# Patient Record
Sex: Female | Born: 1940 | Race: Black or African American | Hispanic: No | Smoking: Never smoker
Health system: Southern US, Community
[De-identification: ages and names within clinical notes are randomized; demographics above are authoritative.]

## PROBLEM LIST (undated history)

## (undated) DIAGNOSIS — E079 Disorder of thyroid, unspecified: Secondary | ICD-10-CM

## (undated) DIAGNOSIS — K219 Gastro-esophageal reflux disease without esophagitis: Secondary | ICD-10-CM

## (undated) DIAGNOSIS — K5790 Diverticulosis of intestine, part unspecified, without perforation or abscess without bleeding: Secondary | ICD-10-CM

## (undated) DIAGNOSIS — K449 Diaphragmatic hernia without obstruction or gangrene: Secondary | ICD-10-CM

## (undated) DIAGNOSIS — M8580 Other specified disorders of bone density and structure, unspecified site: Secondary | ICD-10-CM

## (undated) DIAGNOSIS — E785 Hyperlipidemia, unspecified: Secondary | ICD-10-CM

## (undated) DIAGNOSIS — M109 Gout, unspecified: Secondary | ICD-10-CM

## (undated) DIAGNOSIS — K862 Cyst of pancreas: Secondary | ICD-10-CM

## (undated) DIAGNOSIS — D649 Anemia, unspecified: Secondary | ICD-10-CM

## (undated) DIAGNOSIS — E860 Dehydration: Secondary | ICD-10-CM

## (undated) DIAGNOSIS — N189 Chronic kidney disease, unspecified: Secondary | ICD-10-CM

## (undated) DIAGNOSIS — M1990 Unspecified osteoarthritis, unspecified site: Secondary | ICD-10-CM

## (undated) DIAGNOSIS — Z9289 Personal history of other medical treatment: Secondary | ICD-10-CM

## (undated) DIAGNOSIS — I10 Essential (primary) hypertension: Secondary | ICD-10-CM

## (undated) HISTORY — DX: Gastro-esophageal reflux disease without esophagitis: K21.9

## (undated) HISTORY — PX: OTHER SURGICAL HISTORY: SHX169

## (undated) HISTORY — DX: Other specified disorders of bone density and structure, unspecified site: M85.80

## (undated) HISTORY — DX: Gout, unspecified: M10.9

## (undated) HISTORY — DX: Disorder of thyroid, unspecified: E07.9

## (undated) HISTORY — DX: Diverticulosis of intestine, part unspecified, without perforation or abscess without bleeding: K57.90

## (undated) HISTORY — DX: Cyst of pancreas: K86.2

## (undated) HISTORY — DX: Diaphragmatic hernia without obstruction or gangrene: K44.9

## (undated) HISTORY — DX: Dehydration: E86.0

## (undated) HISTORY — DX: Hyperlipidemia, unspecified: E78.5

## (undated) HISTORY — PX: EYE SURGERY: SHX253

## (undated) HISTORY — PX: CHOLECYSTECTOMY: SHX55

---

## 1997-11-20 DIAGNOSIS — K862 Cyst of pancreas: Secondary | ICD-10-CM

## 1997-11-20 HISTORY — DX: Cyst of pancreas: K86.2

## 1997-11-20 HISTORY — PX: PANCREATIC CYST EXCISION: SHX2157

## 2003-12-23 ENCOUNTER — Other Ambulatory Visit: Admission: RE | Admit: 2003-12-23 | Discharge: 2003-12-23 | Payer: Self-pay | Admitting: Internal Medicine

## 2004-01-11 ENCOUNTER — Ambulatory Visit (HOSPITAL_COMMUNITY): Admission: RE | Admit: 2004-01-11 | Discharge: 2004-01-11 | Payer: Self-pay | Admitting: Internal Medicine

## 2004-12-30 ENCOUNTER — Emergency Department (HOSPITAL_COMMUNITY): Admission: EM | Admit: 2004-12-30 | Discharge: 2004-12-30 | Payer: Self-pay | Admitting: Family Medicine

## 2005-01-23 ENCOUNTER — Ambulatory Visit: Payer: Self-pay

## 2005-01-24 ENCOUNTER — Ambulatory Visit (HOSPITAL_COMMUNITY): Admission: RE | Admit: 2005-01-24 | Discharge: 2005-01-24 | Payer: Self-pay | Admitting: Internal Medicine

## 2006-01-26 ENCOUNTER — Ambulatory Visit (HOSPITAL_COMMUNITY): Admission: RE | Admit: 2006-01-26 | Discharge: 2006-01-26 | Payer: Self-pay | Admitting: Internal Medicine

## 2006-04-20 ENCOUNTER — Other Ambulatory Visit: Admission: RE | Admit: 2006-04-20 | Discharge: 2006-04-20 | Payer: Self-pay | Admitting: Internal Medicine

## 2006-08-24 ENCOUNTER — Encounter: Admission: RE | Admit: 2006-08-24 | Discharge: 2006-08-24 | Payer: Self-pay | Admitting: Gastroenterology

## 2007-01-28 ENCOUNTER — Ambulatory Visit (HOSPITAL_COMMUNITY): Admission: RE | Admit: 2007-01-28 | Discharge: 2007-01-28 | Payer: Self-pay | Admitting: Internal Medicine

## 2008-01-29 ENCOUNTER — Ambulatory Visit (HOSPITAL_COMMUNITY): Admission: RE | Admit: 2008-01-29 | Discharge: 2008-01-29 | Payer: Self-pay | Admitting: Internal Medicine

## 2009-02-09 ENCOUNTER — Ambulatory Visit (HOSPITAL_COMMUNITY): Admission: RE | Admit: 2009-02-09 | Discharge: 2009-02-09 | Payer: Self-pay | Admitting: Internal Medicine

## 2010-02-11 ENCOUNTER — Ambulatory Visit (HOSPITAL_COMMUNITY): Admission: RE | Admit: 2010-02-11 | Discharge: 2010-02-11 | Payer: Self-pay | Admitting: Internal Medicine

## 2010-02-21 ENCOUNTER — Ambulatory Visit (HOSPITAL_COMMUNITY): Admission: RE | Admit: 2010-02-21 | Discharge: 2010-02-21 | Payer: Self-pay | Admitting: Internal Medicine

## 2010-02-21 ENCOUNTER — Ambulatory Visit: Payer: Self-pay | Admitting: Vascular Surgery

## 2010-06-30 ENCOUNTER — Other Ambulatory Visit: Admission: RE | Admit: 2010-06-30 | Discharge: 2010-06-30 | Payer: Self-pay | Admitting: Internal Medicine

## 2010-11-20 HISTORY — PX: JOINT REPLACEMENT: SHX530

## 2010-12-11 ENCOUNTER — Encounter: Payer: Self-pay | Admitting: Internal Medicine

## 2011-01-06 ENCOUNTER — Other Ambulatory Visit (HOSPITAL_COMMUNITY): Payer: Self-pay | Admitting: Internal Medicine

## 2011-01-06 DIAGNOSIS — Z1231 Encounter for screening mammogram for malignant neoplasm of breast: Secondary | ICD-10-CM

## 2011-01-09 ENCOUNTER — Other Ambulatory Visit: Payer: Self-pay | Admitting: Orthopedic Surgery

## 2011-01-09 ENCOUNTER — Encounter (HOSPITAL_COMMUNITY)
Admission: RE | Admit: 2011-01-09 | Discharge: 2011-01-09 | Disposition: A | Discharge status: Home / Self Care | Payer: Medicare Other | Source: Ambulatory Visit | Attending: Orthopedic Surgery | Admitting: Orthopedic Surgery

## 2011-01-09 ENCOUNTER — Other Ambulatory Visit (HOSPITAL_COMMUNITY): Payer: Self-pay | Admitting: Orthopedic Surgery

## 2011-01-09 ENCOUNTER — Encounter (HOSPITAL_COMMUNITY): Payer: Medicare Other

## 2011-01-09 DIAGNOSIS — M1710 Unilateral primary osteoarthritis, unspecified knee: Secondary | ICD-10-CM

## 2011-01-09 DIAGNOSIS — Z01812 Encounter for preprocedural laboratory examination: Secondary | ICD-10-CM | POA: Insufficient documentation

## 2011-01-09 DIAGNOSIS — IMO0002 Reserved for concepts with insufficient information to code with codable children: Secondary | ICD-10-CM | POA: Insufficient documentation

## 2011-01-09 DIAGNOSIS — Z01811 Encounter for preprocedural respiratory examination: Secondary | ICD-10-CM | POA: Insufficient documentation

## 2011-01-09 LAB — URINALYSIS, ROUTINE W REFLEX MICROSCOPIC
Ketones, ur: NEGATIVE mg/dL
Specific Gravity, Urine: 1.011 (ref 1.005–1.030)
Urine Glucose, Fasting: NEGATIVE mg/dL
pH: 5 (ref 5.0–8.0)

## 2011-01-09 LAB — COMPREHENSIVE METABOLIC PANEL
AST: 20 U/L (ref 0–37)
Albumin: 3.7 g/dL (ref 3.5–5.2)
BUN: 28 mg/dL — ABNORMAL HIGH (ref 6–23)
Calcium: 10.6 mg/dL — ABNORMAL HIGH (ref 8.4–10.5)
Creatinine, Ser: 1.96 mg/dL — ABNORMAL HIGH (ref 0.4–1.2)
GFR calc Af Amer: 30 mL/min — ABNORMAL LOW (ref >60)
Total Protein: 7.3 g/dL (ref 6.0–8.3)

## 2011-01-09 LAB — URINE MICROSCOPIC-ADD ON

## 2011-01-09 LAB — CBC
HCT: 35.8 % — ABNORMAL LOW (ref 36.0–46.0)
MCH: 28.5 pg (ref 26.0–34.0)
MCV: 90.4 fL (ref 78.0–100.0)
Platelets: 205 10*3/uL (ref 150–400)
RBC: 3.96 MIL/uL (ref 3.87–5.11)

## 2011-01-18 ENCOUNTER — Inpatient Hospital Stay (HOSPITAL_COMMUNITY)
Admission: RE | Admit: 2011-01-18 | Discharge: 2011-01-21 | DRG: 470 | Disposition: A | Discharge status: Skilled Nursing Facility | Payer: Medicare Other | Source: Ambulatory Visit | Attending: Orthopedic Surgery | Admitting: Orthopedic Surgery

## 2011-01-18 DIAGNOSIS — E119 Type 2 diabetes mellitus without complications: Secondary | ICD-10-CM | POA: Diagnosis present

## 2011-01-18 DIAGNOSIS — M899 Disorder of bone, unspecified: Secondary | ICD-10-CM | POA: Diagnosis present

## 2011-01-18 DIAGNOSIS — N189 Chronic kidney disease, unspecified: Secondary | ICD-10-CM | POA: Diagnosis present

## 2011-01-18 DIAGNOSIS — M949 Disorder of cartilage, unspecified: Secondary | ICD-10-CM | POA: Diagnosis present

## 2011-01-18 DIAGNOSIS — E7800 Pure hypercholesterolemia, unspecified: Secondary | ICD-10-CM | POA: Diagnosis present

## 2011-01-18 DIAGNOSIS — D62 Acute posthemorrhagic anemia: Secondary | ICD-10-CM | POA: Diagnosis not present

## 2011-01-18 DIAGNOSIS — M1710 Unilateral primary osteoarthritis, unspecified knee: Principal | ICD-10-CM | POA: Diagnosis present

## 2011-01-18 DIAGNOSIS — I129 Hypertensive chronic kidney disease with stage 1 through stage 4 chronic kidney disease, or unspecified chronic kidney disease: Secondary | ICD-10-CM | POA: Diagnosis present

## 2011-01-18 LAB — GLUCOSE, CAPILLARY: Glucose-Capillary: 84 mg/dL (ref 70–99)

## 2011-01-18 LAB — ABO/RH: ABO/RH(D): O POS

## 2011-01-19 LAB — CBC
HCT: 26 % — ABNORMAL LOW (ref 36.0–46.0)
Hemoglobin: 8.1 g/dL — ABNORMAL LOW (ref 12.0–15.0)
MCV: 89.3 fL (ref 78.0–100.0)
RDW: 13.4 % (ref 11.5–15.5)
WBC: 7.5 10*3/uL (ref 4.0–10.5)

## 2011-01-19 LAB — BASIC METABOLIC PANEL
BUN: 26 mg/dL — ABNORMAL HIGH (ref 6–23)
Calcium: 9.7 mg/dL (ref 8.4–10.5)
Creatinine, Ser: 1.74 mg/dL — ABNORMAL HIGH (ref 0.4–1.2)
GFR calc non Af Amer: 29 mL/min — ABNORMAL LOW (ref >60)

## 2011-01-19 LAB — PROTIME-INR: INR: 1.22 (ref 0.00–1.49)

## 2011-01-19 LAB — GLUCOSE, CAPILLARY: Glucose-Capillary: 158 mg/dL — ABNORMAL HIGH (ref 70–99)

## 2011-01-20 LAB — TYPE AND SCREEN
ABO/RH(D): O POS
Antibody Screen: NEGATIVE
Unit division: 0
Unit division: 0

## 2011-01-20 LAB — GLUCOSE, CAPILLARY
Glucose-Capillary: 143 mg/dL — ABNORMAL HIGH (ref 70–99)
Glucose-Capillary: 133 mg/dL — ABNORMAL HIGH (ref 70–99)
Glucose-Capillary: 124 mg/dL — ABNORMAL HIGH (ref 70–99)

## 2011-01-20 LAB — BASIC METABOLIC PANEL
GFR calc non Af Amer: 30 mL/min — ABNORMAL LOW (ref >60)
Glucose, Bld: 174 mg/dL — ABNORMAL HIGH (ref 70–99)
Potassium: 4.7 mEq/L (ref 3.5–5.1)
Sodium: 137 mEq/L (ref 135–145)

## 2011-01-20 LAB — CBC
HCT: 32.1 % — ABNORMAL LOW (ref 36.0–46.0)
Hemoglobin: 10.4 g/dL — ABNORMAL LOW (ref 12.0–15.0)
MCH: 28.3 pg (ref 26.0–34.0)
RBC: 3.68 MIL/uL — ABNORMAL LOW (ref 3.87–5.11)

## 2011-01-20 LAB — PROTIME-INR: Prothrombin Time: 18.9 seconds — ABNORMAL HIGH (ref 11.6–15.2)

## 2011-01-21 LAB — CBC
MCH: 28.5 pg (ref 26.0–34.0)
MCV: 87.6 fL (ref 78.0–100.0)
Platelets: 153 10*3/uL (ref 150–400)
RDW: 13.9 % (ref 11.5–15.5)
WBC: 9.6 10*3/uL (ref 4.0–10.5)

## 2011-01-26 NOTE — Discharge Summary (Signed)
Eileen Perez, Eileen Perez               ACCOUNT NO.:  [id number]  MEDICAL RECORD NO.:  [id number]           PATIENT TYPE:  I  LOCATION:  [id number]                         FACILITY:  [facility]  PHYSICIAN:  Gaynelle Arabian, M.D.    DATE OF BIRTH:  [birth year]  DATE OF ADMISSION:  01/18/2011 DATE OF DISCHARGE:                        DISCHARGE SUMMARY - REFERRING   TENTATIVE DATE OF DISCHARGE:  January 21, 2011.  ADMITTING DIAGNOSES: 1. Osteoarthritis left knee. 2. Hypertension. 3. Hypercholesterolemia. 4. Non-insulin-dependent diabetes mellitus. 5. Chronic kidney disease. 6. Osteopenia.  DISCHARGE DIAGNOSES: 1. Osteoarthritis left knee, status post left total knee replacement     arthroplasty. 2. Postop acute blood loss anemia. 3. Status post transfusion without sequelae. 4. Hypertension. 5. Hypercholesterolemia. 6. Non-insulin-dependent diabetes mellitus. 7. Chronic kidney disease. 8. Osteopenia.  PROCEDURE:  January 18, 2011, left total knee.  SURGEON:  Gaynelle Arabian, M.D.  ASSISTANT:  Alexzandrew L. Perkins, P.A.C.  ANESTHESIA:  Spinal anesthesia.  TOURNIQUET TIME:  34 minutes.  CONSULTS:  None.  BRIEF HISTORY:  The patient is a 70 year old female who has been seen by Dr. Wynelle Link for end-stage arthritis of left knee with severe varus deformity, failed nonoperative management and now presents for total knee arthroplasty.  LABORATORY DATA:  Preop CBC showed a hemoglobin 11.3, hematocrit 35.8, white cell count 6.0, and platelets 205,000.  PT/INR 13.9 and 1.05 with PTT of 35.  Chem panel on admission, elevated BUN and creatinine of 28 and 1.96 respectively.  She has known chronic kidney disease.  The alk phos was also elevated at 142.  Calcium was high at 10.6.  Remaining chem panel within normal limits.  Preop UA, positive for protein and trace leukocytes, 0-2 white cells, rare bacteria.  Blood group type O+. Nasal swabs were negative for staph aureus, negative for MRSA.   Serial CBCs were followed.  Hemoglobin dropped down to 8.1 on postop day #1, given 2 units of blood, post receiving hemoglobin back up to 10.4 with hematocrit of 32.1.  Last CBC pending at time of dictation.  Serial pro times followed per Coumadin protocol.  PT/INR postop day #2 is 18.9 and 1.56.  She will have another pro time on the day of discharge.  Serial BMETs were followed.  Potassium went up from 5.0-5.6 down to 4.7.  The BUN actually improved down to 23, the creatinine improved down to 1.68. These were both noted on postop day #2.  EKG dated June 30, 2010, sinus rhythm, nonspecific changes.  HOSPITAL COURSE:  The patient was admitted to [facility], taken to OR, underwent above-stated procedure without complication.  The patient tolerated the procedure well, later transferred to the recovery room orthopedic floor, started on p.o. and IV analgesic pain control following surgery.  Pain was under fairly good control on postop day #1, started getting up out of bed.  Hemoglobin was down to 8.1 though and urine output was marginal.  It was felt that she would benefit undergoing transfusion.  She was given 2 units of packed cells and tolerated the blood well.  She also wanted to look into skilled facility, so got the social work involved.  She was placed on Coumadin for DVT prophylaxis.  She got up out of bed a little bit on day #1.  By day #2, she was starting to move around a little bit better.  Dressing changed, incision looked good.  She was feeling much better after the transfusion.  Hemoglobin was back up to 10.4.  She was in good spirits. It is possible that a bed will be available possibly at [facility] on Saturday.  Arrangements were being made if a bed became available.  She will probably go over the weekend.  She was seen on day #2, and we are making arrangements for the weekend discharge.  DISCHARGE PLAN: 1. Tentative transfer to a skilled facility on January 21, 2011 on     Saturday. 2. Discharge diagnoses, please see above.  DISCHARGE MEDICATIONS:  Current medications at time of transfer include the following list: 1. Coumadin protocol.  Please titrate the Coumadin level for target     INR between 2.0 and 3.0.  She needs to be on Coumadin for 3 weeks     from date of surgery of January 18, 2011. 2. Colace 100 mg p.o. b.i.d. 3. Norvasc 5 mg p.o. at bedtime. 4. Aspirin 325 mg half tablet p.o. daily. 5. Welchol 3.75 g 1 packet every evening. 6. Robaxin 500 mg p.o. q.6-8h. p.r.n. spasm. 7. Percocet 5 mg 1 or 2 every 4-6 hours as needed for pain. 8. Tylenol 325 one or two every 4-6 hours need for mild pain,     temperature, or headache. 9. Restoril 15 mg 1-2 p.o. at bedtime p.r.n. sleep.  DIET:  Heart-healthy renal diet.  ACTIVITY:  She is weightbearing as tolerated, total knee protocol.  PT and OT for gait training, ambulation, ADLs, range of motion, and strengthening exercises.  FOLLOWUP:  She needs to follow up in 2 weeks from date of surgery. Please contact the office at [phone] to make an arrangement for followup care with Dr. Wynelle Link on either January 31, 2011, or February 02, 2011.  Please contact the office of [phone] to help arrange appointment and followup care.  The patient may shower once she is transferred to the skilled nurse facility; however, do not submerge incision under water.  DISPOSITION:  Pending at time of dictation, waiting on skilled nursing facility bed.  CONDITION UPON DISCHARGE:  Improving at time of dictation.  Re-evaluate once bed is available.     Alexzandrew L. Dara Lords, P.A.C.   ______________________________ Gaynelle Arabian, M.D.    ALP/MEDQ  D:  01/20/2011  T:  01/20/2011  Job:  [id number]  cc:   Darcus Austin, D.O. Fax: [phone]  Gaynelle Arabian, M.D. Fax: [phone]  Electronically Signed by Mickel Crow P.A.C. on 01/23/2011 10:48:18 AM Electronically Signed by Gaynelle Arabian M.D. on  01/25/2011 03:47:33 PM

## 2011-01-26 NOTE — H&P (Signed)
Eileen Perez, Eileen Perez               ACCOUNT NO.:  [id number]  MEDICAL RECORD NO.:  [id number]         PATIENT TYPE:  LINP  LOCATION:  [id number]                         FACILITY:  [facility]  PHYSICIAN:  Gaynelle Arabian, M.D.    DATE OF BIRTH:  Jul 26, 1941  DATE OF ADMISSION:  01/18/2011 DATE OF DISCHARGE:                             HISTORY & PHYSICAL   CHIEF COMPLAINT:  Left knee pain.  HISTORY OF PRESENT ILLNESS:  The patient is a 70 year old female who has been seen by Dr. Wynelle Link for ongoing bilateral knee pain, the left is more symptomatic and problematic than the right.  It has been ongoing for several years now, it had progressively gotten worse.  X-rays in the office show advanced arthritis in both knees and the left is worse on radiograph than the right.  She has had injections in the past and at this point, felt to benefit in undergoing surgical intervention.  Risks and benefits have been discussed.  She elected to proceed with surgery.  ALLERGIES:  No known drug allergies.  CURRENT MEDICATIONS:  Quinapril, amlodipine, meloxicam, WelChol, multivitamin, calcium, vitamin D, fish oil, flaxseed oil, zinc, baby aspirin.  PAST MEDICAL HISTORY: 1. Hypertension. 2. Hypercholesterolemia. 3. Non-insulin-dependent diabetes mellitus. 4. Chronic kidney disease. 5. Osteopenia.  PAST SURGICAL HISTORY:  Gallbladder surgery in 1999, pancreatic cyst surgery in 1999 and left cataracts with lens implant.  FAMILY HISTORY:  Father with cancer.  Mother with cancer.  SOCIAL HISTORY:  Divorced, retired, nonsmoker.  No alcohol.  Lives alone.  She does want to look into a rehab facility, possibly [facility] or [facility].  REVIEW OF SYSTEMS:  GENERAL:  No fevers, chills, night sweats.  NEURO: No seizures, syncope or paralysis.  RESPIRATORY:  No shortness breath, productive cough or hemoptysis.  CARDIOVASCULAR:  No chest pain or orthopnea.  GI:  No nausea, vomiting, diarrhea or  constipation.  GU:  No dysuria, hematuria, or discharge.  MUSCULOSKELETAL:  Left knee.  PHYSICAL EXAMINATION:  VITAL SIGNS:  Pulse 64, respirations 12, blood pressure 138/84. GENERAL:  A 70 year old [place] female well-nourished, well- developed, in no acute distress.  She is alert, oriented and cooperative, pleasant.  Good historian. HEENT:  Normocephalic, atraumatic.  Pupils are round and reactive.  EOMs intact. NECK:  Supple. CHEST:  Clear. HEART:  Regular rate and rhythm without murmur, S1 or S2 noted. ABDOMEN:  Soft, slightly round.  Bowel sounds present. BREASTS/GENITALIA:  Not done, not pertinent to present illness. EXTREMITIES:  Left knee has significant deformity.  Range of motion 5 to 115, marked crepitus tender, more medial than lateral.  IMPRESSION:  Osteoarthritis, left knee.  PLAN:  The patient admitted to [facility] to undergo a left total knee replacement arthroplasty.  Surgery will be performed by Dr. Gaynelle Arabian.  She does want to look into skilled facility, possibly [facility] or [facility].     Alexzandrew L. Dara Lords, P.A.C.   ______________________________ Gaynelle Arabian, M.D.    ALP/MEDQ  D:  01/17/2011  T:  01/17/2011  Job:  [id number]  cc:   Darcus Austin, D.O. Fax: [phone]  Electronically Signed by Mickel Crow  P.A.C. on 01/23/2011 10:48:09 AM Electronically Signed by Gaynelle Arabian M.D. on 01/25/2011 03:47:30 PM

## 2011-01-26 NOTE — Op Note (Signed)
[name], [name]               ACCOUNT NO.:  [id number]  MEDICAL RECORD NO.:  [id number]           PATIENT TYPE:  I  LOCATION:  [id number]                         FACILITY:  [facility]  PHYSICIAN:  Gaynelle Arabian, M.D.    DATE OF BIRTH:  06-21-1941  DATE OF PROCEDURE:  01/18/2011 DATE OF DISCHARGE:                              OPERATIVE REPORT   PREOPERATIVE DIAGNOSIS:  Osteoarthritis, left knee.  POSTOPERATIVE DIAGNOSIS:  Osteoarthritis, left knee.  PROCEDURE:  Left total knee arthroplasty.  SURGEON:  Gaynelle Arabian, M.D.  ASSISTANT:  Alexzandrew L. Perkins, P.A.C.  ANESTHESIA:  Spinal.  ESTIMATED BLOOD LOSS:  Minimal.  DRAINS:  Hemovac x1.  TOURNIQUET TIME:  34 minutes at 300 mmHg.  COMPLICATIONS:  None.  CONDITION:  Stable to recovery.  BRIEF CLINICAL NOTE:  Eileen Perez is a 70 year old female with severe end-stage arthritis of the left knee with severe varus deformity.  She has failed nonoperative management and presents for total knee arthroplasty.  PROCEDURE IN DETAIL:  After successful administration of spinal anesthetic, a tourniquet placed high on the left thigh and left lower extremity prepped and draped in the usual sterile fashion.  Extremity was wrapped in Esmarch, knee flexed, tourniquet inflated to 300 mmHg. Midline incision was made with a #10 blade through subcutaneous tissue to the level of the extensor mechanism.  A fresh blade was used to make a medial parapatellar arthrotomy.  Soft tissue on the proximal medial tibia was subperiosteally elevated to the joint line with a knife and into the semimembranosus bursa with a Cobb elevator.  Soft tissue laterally was elevated with attention being paid to avoiding the patellar tendon on the tibial tubercle.  Patella was everted, knee flexed 90 degrees, and ACL and PCL removed.  Drill was used create a starting hole in the distal femur and the canal was thoroughly irrigated.  The 5-degree left valgus alignment  guide was placed and a distal femoral cutting block pinned to remove 10 mm off the distal femur.  Distal femoral resection was made with an oscillating saw.  The tibia subluxed forward and menisci removed.  She had a huge defect on the medial femoral condyle.  The extramedullary cutting guide was then placed referencing proximally at the medial aspect of the tibial tubercle and distally along the second metatarsal axis and tibial crest. Blocks pinned to remove 2 mm off the more deficient medial side.  This was a thicker cut than usual to get to the base of this defect. Resection was made with an oscillating saw.  Size 2.5 was most appropriate tibial component.  The proximal tibia was prepared to modular drill and keel punch for the size 2.5.  The femur was then prepared.  The femoral sizing guide was placed and a size 3.  Rotation was marked at the epicondylar axis and confirmed by creating a rectangular flexion gap at 90 degrees.  The block was pinned in this position with a size 3 and anterior-posterior chamfer cuts made. Intercondylar block placed and that cut made.  Trial size 3 posterior stabilized femur was placed.  A 15-mm posterior stabilized rotating platform insert  trial was placed.  With the 15, full extension achieved with excellent varus-valgus and anterior-posterior stability throughout full range of motion.  The patella was then everted and thickness measured to 23 mm.  Freehand resection with 13 mm, 35 template was placed, lug holes were drilled, trial patella was placed and it tracked normally.  Osteophytes were removed off the posterior femur with the trial in place.  All trials were removed and the cut bone surfaces were prepared with pulsatile lavage.  Cement was mixed and once ready for implantation, the size 2.5 mobile bearing tibial tray size 3 posterior stabilized femur and 35 patella were all cemented into place and the patella was held with a clamp.  Trial 15  mm inserts were placed, knee held in full extension and all extruded cement removed.  When the cement had fully hardened, then the permanent 15 mm posterior stabilized rotating platform insert was placed and a tibial tray.  The wound was copiously irrigated with saline solution and the arthrotomy closed over Hemovac drain with interrupted #1 PDS.  Flexion against gravity was about 140 degrees and patella tracked normally.  Tourniquet was released after total time of 34 minutes.  Subcu was closed interrupted 2-0 Vicryl, subcuticular with running 4-0 Monocryl.  Catheter for the Marcaine pain pump was placed and the pump initiated.  The incision was then cleaned and dried and Steri-Strips and a bulky sterile dressing were applied.  She was then placed into a knee immobilizer, awakened, transported to recovery in stable condition.     Gaynelle Arabian, M.D.     FA/MEDQ  D:  01/18/2011  T:  01/18/2011  Job:  [id number]  Electronically Signed by Gaynelle Arabian M.D. on 01/25/2011 03:47:23 PM

## 2011-02-27 ENCOUNTER — Encounter (HOSPITAL_COMMUNITY): Payer: Medicare Other | Attending: Nephrology

## 2011-02-27 DIAGNOSIS — D509 Iron deficiency anemia, unspecified: Secondary | ICD-10-CM | POA: Insufficient documentation

## 2011-03-06 ENCOUNTER — Encounter (HOSPITAL_COMMUNITY): Payer: Medicare Other

## 2011-03-14 ENCOUNTER — Ambulatory Visit (HOSPITAL_COMMUNITY)
Admission: RE | Admit: 2011-03-14 | Discharge: 2011-03-14 | Disposition: A | Discharge status: Home / Self Care | Payer: Medicare Other | Source: Ambulatory Visit | Attending: Internal Medicine | Admitting: Internal Medicine

## 2011-03-14 DIAGNOSIS — Z1231 Encounter for screening mammogram for malignant neoplasm of breast: Secondary | ICD-10-CM | POA: Insufficient documentation

## 2012-01-23 ENCOUNTER — Other Ambulatory Visit: Payer: Self-pay | Admitting: Internal Medicine

## 2012-01-23 DIAGNOSIS — I10 Essential (primary) hypertension: Secondary | ICD-10-CM

## 2012-01-25 ENCOUNTER — Other Ambulatory Visit: Payer: Medicare Other

## 2012-01-30 ENCOUNTER — Other Ambulatory Visit: Payer: Medicare Other

## 2012-02-02 ENCOUNTER — Ambulatory Visit
Admission: RE | Admit: 2012-02-02 | Discharge: 2012-02-02 | Disposition: A | Discharge status: Home / Self Care | Payer: Medicare Other | Source: Ambulatory Visit | Attending: Internal Medicine | Admitting: Internal Medicine

## 2012-02-02 DIAGNOSIS — I10 Essential (primary) hypertension: Secondary | ICD-10-CM

## 2012-02-20 ENCOUNTER — Other Ambulatory Visit: Payer: Self-pay

## 2012-02-20 ENCOUNTER — Encounter (HOSPITAL_COMMUNITY): Payer: Self-pay | Admitting: Emergency Medicine

## 2012-02-20 ENCOUNTER — Emergency Department (INDEPENDENT_AMBULATORY_CARE_PROVIDER_SITE_OTHER)
Admission: EM | Admit: 2012-02-20 | Discharge: 2012-02-20 | Disposition: A | Discharge status: Home / Self Care | Payer: Medicare Other | Source: Home / Self Care | Attending: Emergency Medicine | Admitting: Emergency Medicine

## 2012-02-20 DIAGNOSIS — I10 Essential (primary) hypertension: Secondary | ICD-10-CM

## 2012-02-20 DIAGNOSIS — T148XXA Other injury of unspecified body region, initial encounter: Secondary | ICD-10-CM

## 2012-02-20 HISTORY — DX: Essential (primary) hypertension: I10

## 2012-02-20 MED ORDER — CLONIDINE HCL 0.1 MG PO TABS
ORAL_TABLET | ORAL | Status: AC
  Filled 2012-02-20: qty 1

## 2012-02-20 MED ORDER — CLONIDINE HCL 0.1 MG PO TABS
ORAL_TABLET | ORAL | Status: AC
  Filled 2012-02-20: qty 2

## 2012-02-20 MED ORDER — CLONIDINE HCL 0.1 MG PO TABS
0.2000 mg | ORAL_TABLET | Freq: Once | ORAL | Status: AC
  Administered 2012-02-20: 0.2 mg via ORAL

## 2012-02-20 MED ORDER — AMLODIPINE BESYLATE 10 MG PO TABS: 10.0000 mg | ORAL_TABLET | Freq: Every day | ORAL | Status: DC

## 2012-02-20 NOTE — ED Notes (Signed)
Provided warm blankets, pillow and repositioning of bed.

## 2012-02-20 NOTE — ED Notes (Signed)
Patient reports having left forearm pain for 3-4 days.  Dull pain left forearm.  Patient also reports "crick" in left neck and top of left shoulder approx 3 weeks ago.  [facility] , the nurse reported blood pressure as 169/98.the last documented blood pressure was 120/73 on 3/29.   The neck  pain has gradually improved since onset.  Patient has switched pcp.  New pcp changed blood pressure medicine approx 3 weeks ago.  Patient reports another bp med added this past Saturday.  Patient unable to tell the names of medicine (new medicine)

## 2012-02-20 NOTE — ED Notes (Signed)
Eileen Perez asked me to check pt. [name]. No known injury. States she carried a heavy package with her L arm 4 days ago and thought she strained it. Pt. has a hx. of diet controlled Type 2 Diabetes and high cholesterol. No hx. of heart problems. States she went to the [facility] today and her BP was 169/95 P 62. She said her pressure is usually 125/75.  They told her to get it checked out.  Pt. states she has an appt. with Dr. Melford Aase tomorrow for an EKG.  Pt. denies SOB, nausea or sweating or other symptoms.  Pt. is alert and in no distress. I told pt. I would tell the RN in the back and to notify us right away if anything changes.  [name] 02/20/2012

## 2012-02-20 NOTE — ED Notes (Signed)
Patient denies complaints, patient is not feeling bad.  No pain or dizziness

## 2012-02-20 NOTE — ED Notes (Signed)
Pt oriented x3 and speaking full sentences without difficulty breathing.

## 2012-02-20 NOTE — ED Notes (Signed)
Notified dr Jake Michaelis of recheck of vital signs, continue with clonidine order and dosing

## 2012-02-20 NOTE — ED Notes (Signed)
Spoke to pharmacy personal at [facility] on [address], reports labetalol 146m  bid is new medicine recently started.  Pharmacy reported patient was taking diltiazem er once a day changed to diltiazem sr bid.

## 2012-02-20 NOTE — Discharge Instructions (Signed)
Arterial Hypertension Arterial hypertension (high blood pressure) is a condition of elevated pressure in your blood vessels. Hypertension over a long period of time is a risk factor for strokes, heart attacks, and heart failure. It is also the leading cause of kidney (renal) failure.  CAUSES   In Adults -- Over 90% of all hypertension has no known cause. This is called essential or primary hypertension. In the other 10% of people with hypertension, the increase in blood pressure is caused by another disorder. This is called secondary hypertension. Important causes of secondary hypertension are:   Heavy alcohol use.   Obstructive sleep apnea.   Hyperaldosterosim (Conn's syndrome).   Steroid use.   Chronic kidney failure.   Hyperparathyroidism.   Medications.   Renal artery stenosis.   Pheochromocytoma.   Cushing's disease.   Coarctation of the aorta.   Scleroderma renal crisis.   Licorice (in excessive amounts).   Drugs (cocaine, methamphetamine).  Your caregiver can explain any items above that apply to you.  In Children -- Secondary hypertension is more common and should always be considered.   Pregnancy -- Few women of childbearing age have high blood pressure. However, up to 10% of them develop hypertension of pregnancy. Generally, this will not harm the woman. It may be a sign of 3 complications of pregnancy: preeclampsia, HELLP syndrome, and eclampsia. Follow up and control with medication is necessary.  SYMPTOMS   This condition normally does not produce any noticeable symptoms. It is usually found during a routine exam.   Malignant hypertension is a late problem of high blood pressure. It may have the following symptoms:   Headaches.   Blurred vision.   End-organ damage (this means your kidneys, heart, lungs, and other organs are being damaged).   Stressful situations can increase the blood pressure. If a person with normal blood pressure has their blood  pressure go up while being seen by their caregiver, this is often termed "white coat hypertension." Its importance is not known. It may be related with eventually developing hypertension or complications of hypertension.   Hypertension is often confused with mental tension, stress, and anxiety.  DIAGNOSIS  The diagnosis is made by 3 separate blood pressure measurements. They are taken at least 1 week apart from each other. If there is organ damage from hypertension, the diagnosis may be made without repeat measurements. Hypertension is usually identified by having blood pressure readings:  Above 140/90 mmHg measured in both arms, at 3 separate times, over a couple weeks.   Over 130/80 mmHg should be considered a risk factor and may require treatment in patients with diabetes.  Blood pressure readings over 120/80 mmHg are called "pre-hypertension" even in non-diabetic patients. To get a true blood pressure measurement, use the following guidelines. Be aware of the factors that can alter blood pressure readings.  Take measurements at least 1 hour after caffeine.   Take measurements 30 minutes after smoking and without any stress. This is another reason to quit smoking - it raises your blood pressure.   Use a proper cuff size. Ask your caregiver if you are not sure about your cuff size.   Most home blood pressure cuffs are automatic. They will measure systolic and diastolic pressures. The systolic pressure is the pressure reading at the start of sounds. Diastolic pressure is the pressure at which the sounds disappear. If you are elderly, measure pressures in multiple postures. Try sitting, lying or standing.   Sit at rest for a minimum of   5 minutes before taking measurements.   You should not be on any medications like decongestants. These are found in many cold medications.   Record your blood pressure readings and review them with your caregiver.  If you have hypertension:  Your caregiver  may do tests to be sure you do not have secondary hypertension (see "causes" above).   Your caregiver may also look for signs of metabolic syndrome. This is also called Syndrome X or Insulin Resistance Syndrome. You may have this syndrome if you have type 2 diabetes, abdominal obesity, and abnormal blood lipids in addition to hypertension.   Your caregiver will take your medical and family history and perform a physical exam.   Diagnostic tests may include blood tests (for glucose, cholesterol, potassium, and kidney function), a urinalysis, or an EKG. Other tests may also be necessary depending on your condition.  PREVENTION  There are important lifestyle issues that you can adopt to reduce your chance of developing hypertension:  Maintain a normal weight.   Limit the amount of salt (sodium) in your diet.   Exercise often.   Limit alcohol intake.   Get enough potassium in your diet. Discuss specific advice with your caregiver.   Follow a DASH diet (dietary approaches to stop hypertension). This diet is rich in fruits, vegetables, and low-fat dairy products, and avoids certain fats.  PROGNOSIS  Essential hypertension cannot be cured. Lifestyle changes and medical treatment can lower blood pressure and reduce complications. The prognosis of secondary hypertension depends on the underlying cause. Many people whose hypertension is controlled with medicine or lifestyle changes can live a normal, healthy life.  RISKS AND COMPLICATIONS  While high blood pressure alone is not an illness, it often requires treatment due to its short- and long-term effects on many organs. Hypertension increases your risk for:  CVAs or strokes (cerebrovascular accident).   Heart failure due to chronically high blood pressure (hypertensive cardiomyopathy).   Heart attack (myocardial infarction).   Damage to the retina (hypertensive retinopathy).   Kidney failure (hypertensive nephropathy).  Your caregiver can  explain list items above that apply to you. Treatment of hypertension can significantly reduce the risk of complications. TREATMENT   For overweight patients, weight loss and regular exercise are recommended. Physical fitness lowers blood pressure.   Mild hypertension is usually treated with diet and exercise. A diet rich in fruits and vegetables, fat-free dairy products, and foods low in fat and salt (sodium) can help lower blood pressure. Decreasing salt intake decreases blood pressure in a 1/3 of people.   Stop smoking if you are a smoker.  The steps above are highly effective in reducing blood pressure. While these actions are easy to suggest, they are difficult to achieve. Most patients with moderate or severe hypertension end up requiring medications to bring their blood pressure down to a normal level. There are several classes of medications for treatment. Blood pressure pills (antihypertensives) will lower blood pressure by their different actions. Lowering the blood pressure by 10 mmHg may decrease the risk of complications by as much as 25%. The goal of treatment is effective blood pressure control. This will reduce your risk for complications. Your caregiver will help you determine the best treatment for you according to your lifestyle. What is excellent treatment for one person, may not be for you. HOME CARE INSTRUCTIONS   Do not smoke.   Follow the lifestyle changes outlined in the "Prevention" section.   If you are on medications, follow the directions   carefully. Blood pressure medications must be taken as prescribed. Skipping doses reduces their benefit. It also puts you at risk for problems.   Follow up with your caregiver, as directed.   If you are asked to monitor your blood pressure at home, follow the guidelines in the "Diagnosis" section above.  SEEK MEDICAL CARE IF:   You think you are having medication side effects.   You have recurrent headaches or lightheadedness.     You have swelling in your ankles.   You have trouble with your vision.  SEEK IMMEDIATE MEDICAL CARE IF:   You have sudden onset of chest pain or pressure, difficulty breathing, or other symptoms of a heart attack.   You have a severe headache.   You have symptoms of a stroke (such as sudden weakness, difficulty speaking, difficulty walking).  MAKE SURE YOU:   Understand these instructions.   Will watch your condition.   Will get help right away if you are not doing well or get worse.  Document Released: 11/06/2005 Document Revised: 10/26/2011 Document Reviewed: 06/06/2007 [employer] Patient Information 2012 [employer]. 

## 2012-02-20 NOTE — ED Provider Notes (Signed)
Chief Complaint  Patient presents with  . Arm Pain    History of Present Illness:  Eileen Perez is a 71 year old female with a known history of high blood pressure which has been going on for years. Eileen Perez was seeing Dr. Gypsy Balsam who had maintained her blood pressure on quinapril and amlodipine, with good control. Dr. Gerilyn Nestle left her practice suddenly and so Eileen Perez saw a couple of other physicians in that same practice who changed all her medications. Eileen Perez was placed on labetalol 100 mg twice a day and more recently on diltiazem 120 mg SR twice a day. Eileen Perez's been on these about 2 weeks and doesn't think her blood pressures been under control. As recently as March 29, Eileen Perez thinks her blood pressure is 120/72, [facility]. Eileen Perez's had some blurring of her vision and a slight headache. Eileen Perez denies any dizziness. For the past 3-4 days Eileen Perez's had some aching in her left arm around the elbow. This is worse when Eileen Perez lifts. This seemed to come on after Eileen Perez lifted some heavy books. Eileen Perez denies any anterior chest pain, tightness, pressure, or shortness of breath, PND, orthopnea. Eileen Perez does have some swelling of her ankles.  Review of Systems:  Other than noted above, the patient denies any of the following symptoms: Systemic:  No fever, chills, fatigue, weight loss or gain. Respiratory:  No coughing, wheezing, or shortness of breath. Cardiac:  No chest pain, tightness, pressure, palpitations, syncope, or edema. Neuro:  No headache, dizziness, blurred vision, weakness, paresthesias, or strokelike symptoms.  [place]:  Past medical history, family history, social history, meds, and allergies were reviewed.  Physical Exam:   Vital signs:  BP 100/60  Pulse 56  Temp(Src) 97.3 F (36.3 C) (Oral)  Resp 16  SpO2 98% Filed Vitals:   02/20/12 1249 02/20/12 1314 02/20/12 1434 02/20/12 1543  BP: 199/80 191/79 176/67 100/60  Pulse:  70  56  Temp:  97.3 F (36.3 C)    TempSrc:   Oral    Resp:  16    SpO2:  98%      General:  Alert, oriented, in no distress. Lungs:  Breath sounds clear and equal bilaterally.  No wheezes, rales, or rhonchi. Heart:  Regular rhythm, no gallops, murmers, clicks or rubs.  Abdomen:  Soft and flat.  Nontender, no organomegaly or mass.  No pulsatile midline abdominal mass or bruit. Ext:  Eileen Perez has bilateral pitting ankle and pedal edema, pulses full. Exam of the left arm reveals no tenderness to palpation and full range of motion of all joints without any pain. Pulses were full. Muscle strength and sensation were normal.    Date: 02/20/2012  Rate: 59  Rhythm: Sinus bradycardia  QRS Axis: normal  Intervals: normal  ST/T Wave abnormalities: normal  Conduction Disutrbances:none  Narrative Interpretation: Sinus bradycardia, otherwise normal EKG.  Old EKG Reviewed: unchanged    Medications given in [facility]:  Eileen Perez was given clonidine 0.2 mg by mouth. Thereafter her blood pressure declined to a fairly low level, however Eileen Perez felt fine and had no symptoms, but typically denied any dizziness or presyncope. Eileen Perez was told to monitor the way Eileen Perez felt and if Eileen Perez should develop any presyncope or syncope to return here or to the emergency room. Otherwise Eileen Perez can go and go home, I told her to stop the diltiazem, since this didn't seem to work as well and switched back to the amlodipine 10 mg per day, although  the continuing on with the labetalol for right now. I suggested Eileen Perez get back and see Dr. Melford Aase next week for a recheck on her blood pressure.  Assessment:   Diagnoses that have been ruled out:  None  Diagnoses that are still under consideration:  None  Final diagnoses:  Hypertension - not well controlled   Muscle strain    Plan:   1.  The following meds were prescribed:   New Prescriptions   AMLODIPINE (NORVASC) 10 MG TABLET    Take 1 tablet (10 mg total) by mouth daily.   2.  The patient was instructed in symptomatic care and handouts were  given. 3.  The patient was told to return if becoming worse in any way, if no better in 3 or 4 days, and given some red flag symptoms that would indicate earlier return.  Follow up:  The patient was told to follow up with Dr. Glenda Chroman next week.   Harden Mo, MD 02/20/12 1700

## 2012-03-21 ENCOUNTER — Other Ambulatory Visit: Payer: Self-pay | Admitting: Gastroenterology

## 2012-03-21 HISTORY — PX: COLONOSCOPY: SHX174

## 2012-04-03 ENCOUNTER — Other Ambulatory Visit (HOSPITAL_COMMUNITY): Payer: Self-pay | Admitting: Internal Medicine

## 2012-04-03 DIAGNOSIS — Z1231 Encounter for screening mammogram for malignant neoplasm of breast: Secondary | ICD-10-CM

## 2012-05-01 ENCOUNTER — Ambulatory Visit (HOSPITAL_COMMUNITY)
Admission: RE | Admit: 2012-05-01 | Discharge: 2012-05-01 | Disposition: A | Discharge status: Home / Self Care | Payer: Medicare Other | Source: Ambulatory Visit | Attending: Internal Medicine | Admitting: Internal Medicine

## 2012-05-01 DIAGNOSIS — Z1231 Encounter for screening mammogram for malignant neoplasm of breast: Secondary | ICD-10-CM | POA: Insufficient documentation

## 2012-07-24 ENCOUNTER — Other Ambulatory Visit (HOSPITAL_COMMUNITY): Payer: Self-pay | Admitting: Internal Medicine

## 2012-07-24 DIAGNOSIS — M8580 Other specified disorders of bone density and structure, unspecified site: Secondary | ICD-10-CM

## 2012-07-29 ENCOUNTER — Ambulatory Visit (HOSPITAL_COMMUNITY): Payer: Medicare Other

## 2012-08-02 ENCOUNTER — Ambulatory Visit (HOSPITAL_COMMUNITY)
Admission: RE | Admit: 2012-08-02 | Discharge: 2012-08-02 | Disposition: A | Discharge status: Home / Self Care | Payer: Medicare Other | Source: Ambulatory Visit | Attending: Internal Medicine | Admitting: Internal Medicine

## 2012-08-02 DIAGNOSIS — Z780 Asymptomatic menopausal state: Secondary | ICD-10-CM | POA: Insufficient documentation

## 2012-08-02 DIAGNOSIS — M899 Disorder of bone, unspecified: Secondary | ICD-10-CM | POA: Insufficient documentation

## 2012-08-02 DIAGNOSIS — Z13820 Encounter for screening for osteoporosis: Secondary | ICD-10-CM | POA: Insufficient documentation

## 2012-08-02 DIAGNOSIS — M8580 Other specified disorders of bone density and structure, unspecified site: Secondary | ICD-10-CM

## 2013-03-18 ENCOUNTER — Other Ambulatory Visit (HOSPITAL_COMMUNITY): Payer: Self-pay | Admitting: *Deleted

## 2013-03-18 ENCOUNTER — Encounter (HOSPITAL_COMMUNITY): Payer: Medicare Other

## 2013-03-19 ENCOUNTER — Encounter (HOSPITAL_COMMUNITY)
Admission: RE | Admit: 2013-03-19 | Discharge: 2013-03-19 | Disposition: A | Discharge status: Home / Self Care | Payer: Medicare Other | Source: Ambulatory Visit | Attending: Nephrology | Admitting: Nephrology

## 2013-03-19 DIAGNOSIS — D649 Anemia, unspecified: Secondary | ICD-10-CM | POA: Insufficient documentation

## 2013-03-19 MED ORDER — SODIUM CHLORIDE 0.9 % IV SOLN
1020.0000 mg | Freq: Once | INTRAVENOUS | Status: AC
  Administered 2013-03-19: 1020 mg via INTRAVENOUS
  Filled 2013-03-19: qty 34

## 2013-04-23 ENCOUNTER — Other Ambulatory Visit (HOSPITAL_COMMUNITY): Payer: Self-pay | Admitting: Internal Medicine

## 2013-04-23 DIAGNOSIS — Z1231 Encounter for screening mammogram for malignant neoplasm of breast: Secondary | ICD-10-CM

## 2013-05-13 ENCOUNTER — Ambulatory Visit (HOSPITAL_COMMUNITY): Payer: Medicare Other

## 2013-05-29 ENCOUNTER — Encounter (HOSPITAL_COMMUNITY): Payer: Self-pay | Admitting: Pharmacy Technician

## 2013-05-29 NOTE — Progress Notes (Signed)
Need orders please - PT coming for PREOP MON 06/02/13 - Thank you

## 2013-05-30 ENCOUNTER — Other Ambulatory Visit (HOSPITAL_COMMUNITY): Payer: Self-pay | Admitting: Orthopedic Surgery

## 2013-05-30 NOTE — Patient Instructions (Addendum)
[place]  05/30/2013   Your procedure is scheduled on: 06-09-2013  Report to [place] at 1045 AM.  Call this number if you have problems the morning of surgery [phone]   Remember:   Do not eat food  :After Midnight. Sunday NIGHT   clear lioquids midnight until 745 am day of surgery, then nothing by mouth.   Take these medicines the morning of surgery with A SIP OF WATER: LABETALOL                                                 MAY TAKE NORCO IF NEEDED                                SEE [place] PREPARING FOR SURGERY SHEET   Do not wear jewelry, make-up or nail polish.  Do not wear lotions, powders, or perfumes. You may wear deodorant.   Men may shave face and neck.  Do not bring valuables to the hospital. [place].  Contacts, dentures or bridgework may not be worn into surgery.  Leave suitcase in the car. After surgery it may be brought to your room.  For patients admitted to the hospital, checkout time is 11:00 AM the day of discharge.   Patients discharged the day of surgery will not be allowed to drive home.  Name and phone number of your driver:  Special Instructions: N/A   Please read over the following fact sheets that you were given: MRSA Information, blood fact sheet, incentive spirometer fact sheet, clear liquid sheet  Call Benard Rink RN pre op nurse if needed [phone]    [place].  PATIENT SIGNATURE___________________________________________  NURSE SIGNATURE_____________________________________________

## 2013-06-01 ENCOUNTER — Other Ambulatory Visit: Payer: Self-pay | Admitting: Orthopedic Surgery

## 2013-06-01 MED ORDER — BUPIVACAINE LIPOSOME 1.3 % IJ SUSP: 20.0000 mL | Freq: Once | INTRAMUSCULAR | Status: DC

## 2013-06-01 MED ORDER — DEXAMETHASONE SODIUM PHOSPHATE 10 MG/ML IJ SOLN: 10.0000 mg | Freq: Once | INTRAMUSCULAR | Status: DC

## 2013-06-01 NOTE — Progress Notes (Signed)
Preoperative surgical orders have been place into the [facility] hospital system for Eileen Perez on 06/01/2013, 10:51 PM  by Mickel Crow for surgery on 06/09/2013.  Preop Total Knee orders including Experal, PO Tylenol, and IV Decadron as long as there are no contraindications to the above medications. Arlee Muslim, PA-C

## 2013-06-02 ENCOUNTER — Encounter (HOSPITAL_COMMUNITY): Payer: Self-pay

## 2013-06-02 ENCOUNTER — Ambulatory Visit (HOSPITAL_COMMUNITY)
Admission: RE | Admit: 2013-06-02 | Discharge: 2013-06-02 | Disposition: A | Discharge status: Home / Self Care | Payer: Medicare Other | Source: Ambulatory Visit | Attending: Orthopedic Surgery | Admitting: Orthopedic Surgery

## 2013-06-02 ENCOUNTER — Encounter (HOSPITAL_COMMUNITY)
Admission: RE | Admit: 2013-06-02 | Discharge: 2013-06-02 | Disposition: A | Discharge status: Home / Self Care | Payer: Medicare Other | Source: Ambulatory Visit | Attending: Orthopedic Surgery | Admitting: Orthopedic Surgery

## 2013-06-02 DIAGNOSIS — Z01818 Encounter for other preprocedural examination: Secondary | ICD-10-CM | POA: Insufficient documentation

## 2013-06-02 DIAGNOSIS — M1710 Unilateral primary osteoarthritis, unspecified knee: Secondary | ICD-10-CM | POA: Insufficient documentation

## 2013-06-02 DIAGNOSIS — Z01810 Encounter for preprocedural cardiovascular examination: Secondary | ICD-10-CM | POA: Insufficient documentation

## 2013-06-02 HISTORY — DX: Anemia, unspecified: D64.9

## 2013-06-02 HISTORY — DX: Unspecified osteoarthritis, unspecified site: M19.90

## 2013-06-02 HISTORY — DX: Chronic kidney disease, unspecified: N18.9

## 2013-06-02 HISTORY — DX: Personal history of other medical treatment: Z92.89

## 2013-06-02 LAB — PROTIME-INR
INR: 0.99 (ref 0.00–1.49)
Prothrombin Time: 12.9 seconds (ref 11.6–15.2)

## 2013-06-02 LAB — CBC
HCT: 35.1 % — ABNORMAL LOW (ref 36.0–46.0)
Hemoglobin: 11.2 g/dL — ABNORMAL LOW (ref 12.0–15.0)
MCH: 29.2 pg (ref 26.0–34.0)
MCV: 91.6 fL (ref 78.0–100.0)
RBC: 3.83 MIL/uL — ABNORMAL LOW (ref 3.87–5.11)

## 2013-06-02 LAB — SURGICAL PCR SCREEN: MRSA, PCR: NEGATIVE

## 2013-06-02 LAB — URINALYSIS, ROUTINE W REFLEX MICROSCOPIC
Bilirubin Urine: NEGATIVE
Glucose, UA: NEGATIVE mg/dL
Hgb urine dipstick: NEGATIVE
Protein, ur: 100 mg/dL — AB

## 2013-06-02 LAB — COMPREHENSIVE METABOLIC PANEL
ALT: 27 U/L (ref 0–35)
BUN: 54 mg/dL — ABNORMAL HIGH (ref 6–23)
CO2: 24 mEq/L (ref 19–32)
Calcium: 10.5 mg/dL (ref 8.4–10.5)
Creatinine, Ser: 2.32 mg/dL — ABNORMAL HIGH (ref 0.50–1.10)
GFR calc Af Amer: 23 mL/min — ABNORMAL LOW (ref >90)
GFR calc non Af Amer: 20 mL/min — ABNORMAL LOW (ref >90)
Glucose, Bld: 118 mg/dL — ABNORMAL HIGH (ref 70–99)

## 2013-06-02 NOTE — Progress Notes (Signed)
Clearance with note Dr Lawanna Kobus on chart,  Baseline labs 6/14 on chart

## 2013-06-02 NOTE — Progress Notes (Signed)
LOV Dr Moshe Cipro, [place]  6/14 on cahrt

## 2013-06-02 NOTE — Progress Notes (Signed)
Faxed urine with micro, CMET to Dr Wynelle Link through [facility]- copy of labs 04/30/13 on chart

## 2013-06-04 ENCOUNTER — Other Ambulatory Visit (HOSPITAL_COMMUNITY): Payer: Medicare Other

## 2013-06-08 ENCOUNTER — Other Ambulatory Visit: Payer: Self-pay | Admitting: Surgical

## 2013-06-08 NOTE — H&P (Signed)
TOTAL KNEE ADMISSION H&P  Patient is being admitted for right total knee arthroplasty.  Subjective:  Chief Complaint:right knee pain.  HPI: Eileen Perez, 72 y.o. female, has a history of pain and functional disability in the right knee due to arthritis and has failed non-surgical conservative treatments for greater than 12 weeks to includeNSAID's and/or analgesics, corticosteriod injections, use of assistive devices and activity modification.  Onset of symptoms was gradual, starting 2 years ago with gradually worsening course since that time. The patient noted no past surgery on the right knee(s).  Patient currently rates pain in the right knee(s) at 7 out of 10 with activity. Patient has night pain, worsening of pain with activity and weight bearing, pain that interferes with activities of daily living, pain with passive range of motion, crepitus and joint swelling.  Patient has evidence of periarticular osteophytes, joint subluxation and joint space narrowing by imaging studies. There is no active infection.   Past Medical History  Diagnosis Date  . Diabetes mellitus   . Hypertension   . Arthritis   . Anemia   . History of blood transfusion   . Chronic kidney disease     Past Surgical History  Procedure Laterality Date  . Pancreatic cyst excision    . Knee surgery    . Joint replacement  2012    left knee  . Eye surgery Left     cataract extraction with IOL  . Cholecystectomy       Current outpatient prescriptions: allopurinol (ZYLOPRIM) 300 MG tablet, Take 150 mg by mouth every Monday, Wednesday, and Friday., Disp: , Rfl: ;  cholecalciferol (VITAMIN D) 1000 UNITS tablet, Take 1,000 Units by mouth daily., Disp: , Rfl: ;  Colesevelam HCl 3.75 G PACK, Take 1 packet by mouth at bedtime. , Disp: , Rfl: ;  ferrous sulfate dried (SLOW FE) 160 (50 FE) MG TBCR, Take 160 mg by mouth daily., Disp: , Rfl:  fish oil-omega-3 fatty acids 1000 MG capsule, Take 1 g by mouth daily., Disp: , Rfl: ;   Flaxseed, Linseed, (FLAX SEED OIL) 1000 MG CAPS, Take 1 capsule by mouth daily., Disp: , Rfl: ;  hydrochlorothiazide (HYDRODIURIL) 25 MG tablet, Take 25 mg by mouth every morning., Disp: , Rfl: ;  HYDROcodone-acetaminophen (NORCO/VICODIN) 5-325 MG per tablet, Take 1 tablet by mouth every 6 (six) hours as needed for pain., Disp: , Rfl:  labetalol (NORMODYNE) 200 MG tablet, Take 100 mg by mouth 2 (two) times daily., Disp: , Rfl: ;  Multiple Vitamin (MULTIVITAMIN WITH MINERALS) TABS, Take 1 tablet by mouth daily., Disp: , Rfl: ;  OVER THE COUNTER MEDICATION, Take 1 capsule by mouth daily. Red Yeast Rice 1200mg , Disp: , Rfl: ;  zinc gluconate 50 MG tablet, Take 50 mg by mouth daily., Disp: , Rfl:  [DISCONTINUED] diltiazem (CARDIZEM) 120 MG tablet, Take 120 mg by mouth 2 (two) times daily., Disp: , Rfl:   No Known Allergies  History  Substance Use Topics  . Smoking status: Never Smoker   . Smokeless tobacco: Never Used  . Alcohol Use: No    Family History Father deceased age 51 due to cancer Mother deceased age 82 due to cancer Brother deceased age 72 due to cancer  Review of Systems  Constitutional: Negative.   HENT: Negative.  Negative for neck pain.   Eyes: Negative.   Respiratory: Negative.   Cardiovascular: Negative.   Gastrointestinal: Negative.   Genitourinary: Negative.   Musculoskeletal: Positive for joint pain. Negative for myalgias,  back pain and falls.       Right knee  Skin: Negative.   Endo/Heme/Allergies: Negative.   Psychiatric/Behavioral: Negative.     Objective:  Physical Exam  Constitutional: She is oriented to person, place, and time. She appears well-developed and well-nourished. No distress.  HENT:  Head: Normocephalic.  Right Ear: External ear normal.  Left Ear: External ear normal.  Nose: Nose normal.  Mouth/Throat: Oropharynx is clear and moist.  Eyes: Conjunctivae are normal.  Neck: Normal range of motion. Neck supple. No tracheal deviation present. No  thyromegaly present.  Cardiovascular: Normal rate, regular rhythm, normal heart sounds and intact distal pulses.   No murmur heard. Respiratory: Effort normal and breath sounds normal. No respiratory distress. She has no wheezes. She exhibits no tenderness.  GI: Soft. Bowel sounds are normal. She exhibits no distension and no mass. There is no tenderness.  Musculoskeletal:       Right hip: Normal.       Left hip: Normal.       Right knee: She exhibits decreased range of motion and swelling. She exhibits no effusion and no erythema. Tenderness found. Medial joint line, lateral joint line and LCL tenderness noted.       Left knee: Normal.       Right lower leg: She exhibits tenderness. She exhibits no swelling.       Left lower leg: She exhibits no tenderness and no swelling.   Evaluation of her hip shows normal range of motion and no discomfort. Her left knee looks great. Her scar is well healed. Range of motion is about 0 to 125 degrees with no tenderness or instability. The right knee shows significant varus deformity. Range 5 to 120. Marked crepitus on range of motion. Tenderness medial greater than lateral with no instability noted. She is also tender along the lateral lower leg around the peroneal tendons. She has some tenderness lateral collateral ligament also. Her gait pattern is significantly antalgic.  Lymphadenopathy:    She has no cervical adenopathy.  Neurological: She is alert and oriented to person, place, and time. She has normal strength and normal reflexes. No sensory deficit.  Skin: No rash noted. She is not diaphoretic. No erythema.  Psychiatric: She has a normal mood and affect. Her behavior is normal.    Vitals Weight: 139 lb Height: 62 in Body Surface Area: 1.66 m Body Mass Index: 25.42 kg/m Pulse: 76 (Regular) BP: 144/82 (Sitting, Left Arm, Standard)  Imaging Review Plain radiographs demonstrate severe degenerative joint disease of the right  knee(s). The overall alignment issignificant varus. The bone quality appears to be fair for age and reported activity level.  Assessment/Plan:  End stage arthritis, right knee   The patient history, physical examination, clinical judgment of the provider and imaging studies are consistent with end stage degenerative joint disease of the right knee(s) and total knee arthroplasty is deemed medically necessary. The treatment options including medical management, injection therapy arthroscopy and arthroplasty were discussed at length. The risks and benefits of total knee arthroplasty were presented and reviewed. The risks due to aseptic loosening, infection, stiffness, patella tracking problems, thromboembolic complications and other imponderables were discussed. The patient acknowledged the explanation, agreed to proceed with the plan and consent was signed. Patient is being admitted for inpatient treatment for surgery, pain control, PT, OT, prophylactic antibiotics, VTE prophylaxis, progressive ambulation and ADL's and discharge planning. The patient is planning to be discharged home with home health services    [place],  PA-C

## 2013-06-09 ENCOUNTER — Inpatient Hospital Stay (HOSPITAL_COMMUNITY)
Admission: RE | Admit: 2013-06-09 | Discharge: 2013-06-11 | DRG: 470 | Disposition: A | Discharge status: Home Health | Payer: Medicare Other | Source: Ambulatory Visit | Attending: Orthopedic Surgery | Admitting: Orthopedic Surgery

## 2013-06-09 ENCOUNTER — Encounter (HOSPITAL_COMMUNITY): Payer: Self-pay | Admitting: *Deleted

## 2013-06-09 ENCOUNTER — Inpatient Hospital Stay (HOSPITAL_COMMUNITY): Payer: Medicare Other | Admitting: *Deleted

## 2013-06-09 ENCOUNTER — Encounter (HOSPITAL_COMMUNITY)
Admission: RE | Disposition: A | Discharge status: Home Health | Payer: Self-pay | Source: Ambulatory Visit | Attending: Orthopedic Surgery

## 2013-06-09 DIAGNOSIS — I10 Essential (primary) hypertension: Secondary | ICD-10-CM

## 2013-06-09 DIAGNOSIS — M1710 Unilateral primary osteoarthritis, unspecified knee: Principal | ICD-10-CM | POA: Diagnosis present

## 2013-06-09 DIAGNOSIS — E119 Type 2 diabetes mellitus without complications: Secondary | ICD-10-CM | POA: Diagnosis present

## 2013-06-09 DIAGNOSIS — Z96659 Presence of unspecified artificial knee joint: Secondary | ICD-10-CM

## 2013-06-09 DIAGNOSIS — I129 Hypertensive chronic kidney disease with stage 1 through stage 4 chronic kidney disease, or unspecified chronic kidney disease: Secondary | ICD-10-CM | POA: Diagnosis present

## 2013-06-09 DIAGNOSIS — Z79899 Other long term (current) drug therapy: Secondary | ICD-10-CM

## 2013-06-09 DIAGNOSIS — D62 Acute posthemorrhagic anemia: Secondary | ICD-10-CM | POA: Diagnosis not present

## 2013-06-09 DIAGNOSIS — M159 Polyosteoarthritis, unspecified: Secondary | ICD-10-CM

## 2013-06-09 DIAGNOSIS — N189 Chronic kidney disease, unspecified: Secondary | ICD-10-CM

## 2013-06-09 HISTORY — PX: TOTAL KNEE ARTHROPLASTY: SHX125

## 2013-06-09 HISTORY — DX: Polyosteoarthritis, unspecified: M15.9

## 2013-06-09 LAB — GLUCOSE, CAPILLARY: Glucose-Capillary: 105 mg/dL — ABNORMAL HIGH (ref 70–99)

## 2013-06-09 LAB — TYPE AND SCREEN: ABO/RH(D): O POS

## 2013-06-09 SURGERY — ARTHROPLASTY, KNEE, TOTAL
Anesthesia: Spinal | Site: Knee | Laterality: Right | Wound class: Clean

## 2013-06-09 MED ORDER — TRAMADOL HCL 50 MG PO TABS
50.0000 mg | ORAL_TABLET | Freq: Four times a day (QID) | ORAL | Status: DC | PRN
  Administered 2013-06-10: 100 mg via ORAL
  Filled 2013-06-09: qty 2

## 2013-06-09 MED ORDER — BISACODYL 10 MG RE SUPP: 10.0000 mg | Freq: Every day | RECTAL | Status: DC | PRN

## 2013-06-09 MED ORDER — MENTHOL 3 MG MT LOZG
1.0000 | LOZENGE | OROMUCOSAL | Status: DC | PRN
  Filled 2013-06-09 (×2): qty 9

## 2013-06-09 MED ORDER — BUPIVACAINE LIPOSOME 1.3 % IJ SUSP
20.0000 mL | Freq: Once | INTRAMUSCULAR | Status: DC
  Filled 2013-06-09: qty 20

## 2013-06-09 MED ORDER — TRANEXAMIC ACID 100 MG/ML IV SOLN
1000.0000 mg | INTRAVENOUS | Status: AC
  Administered 2013-06-09: 1000 mg via INTRAVENOUS
  Filled 2013-06-09: qty 10

## 2013-06-09 MED ORDER — BUPIVACAINE LIPOSOME 1.3 % IJ SUSP
INTRAMUSCULAR | Status: DC | PRN
  Administered 2013-06-09: 20 mL

## 2013-06-09 MED ORDER — HYDROMORPHONE HCL PF 1 MG/ML IJ SOLN
0.2500 mg | INTRAMUSCULAR | Status: DC | PRN
  Administered 2013-06-09: 0.5 mg via INTRAVENOUS

## 2013-06-09 MED ORDER — MIDAZOLAM HCL 5 MG/5ML IJ SOLN
INTRAMUSCULAR | Status: DC | PRN
  Administered 2013-06-09: 2 mg via INTRAVENOUS

## 2013-06-09 MED ORDER — PROPOFOL 10 MG/ML IV BOLUS
INTRAVENOUS | Status: DC | PRN
  Administered 2013-06-09: 150 mg via INTRAVENOUS

## 2013-06-09 MED ORDER — OXYCODONE HCL 5 MG PO TABS
5.0000 mg | ORAL_TABLET | ORAL | Status: DC | PRN
  Administered 2013-06-09 – 2013-06-11 (×10): 10 mg via ORAL
  Filled 2013-06-09 (×10): qty 2

## 2013-06-09 MED ORDER — DOCUSATE SODIUM 100 MG PO CAPS
100.0000 mg | ORAL_CAPSULE | Freq: Two times a day (BID) | ORAL | Status: DC
  Administered 2013-06-09 – 2013-06-11 (×4): 100 mg via ORAL

## 2013-06-09 MED ORDER — ACETAMINOPHEN 500 MG PO TABS
1000.0000 mg | ORAL_TABLET | Freq: Once | ORAL | Status: AC
  Administered 2013-06-09: 1000 mg via ORAL
  Filled 2013-06-09: qty 2

## 2013-06-09 MED ORDER — KETAMINE HCL 10 MG/ML IJ SOLN
INTRAMUSCULAR | Status: DC | PRN
  Administered 2013-06-09: 25 mg via INTRAVENOUS

## 2013-06-09 MED ORDER — ONDANSETRON HCL 4 MG/2ML IJ SOLN: 4.0000 mg | Freq: Four times a day (QID) | INTRAMUSCULAR | Status: DC | PRN

## 2013-06-09 MED ORDER — METOCLOPRAMIDE HCL 10 MG PO TABS: 5.0000 mg | ORAL_TABLET | Freq: Three times a day (TID) | ORAL | Status: DC | PRN

## 2013-06-09 MED ORDER — LACTATED RINGERS IV SOLN
INTRAVENOUS | Status: DC
  Administered 2013-06-09: 1000 mL via INTRAVENOUS
  Administered 2013-06-09: 15:00:00 via INTRAVENOUS

## 2013-06-09 MED ORDER — LACTATED RINGERS IV SOLN: INTRAVENOUS | Status: DC

## 2013-06-09 MED ORDER — ACETAMINOPHEN 650 MG RE SUPP
650.0000 mg | Freq: Four times a day (QID) | RECTAL | Status: AC
  Filled 2013-06-09: qty 1

## 2013-06-09 MED ORDER — PROPOFOL INFUSION 10 MG/ML OPTIME
INTRAVENOUS | Status: DC | PRN
  Administered 2013-06-09: 100 ug/kg/min via INTRAVENOUS

## 2013-06-09 MED ORDER — PHENOL 1.4 % MT LIQD
1.0000 | OROMUCOSAL | Status: DC | PRN
  Administered 2013-06-10: 1 via OROMUCOSAL
  Filled 2013-06-09 (×2): qty 177

## 2013-06-09 MED ORDER — BUPIVACAINE IN DEXTROSE 0.75-8.25 % IT SOLN
INTRATHECAL | Status: DC | PRN
  Administered 2013-06-09: 2 mL via INTRATHECAL

## 2013-06-09 MED ORDER — DEXAMETHASONE SODIUM PHOSPHATE 10 MG/ML IJ SOLN
INTRAMUSCULAR | Status: DC | PRN
  Administered 2013-06-09: 10 mg via INTRAVENOUS

## 2013-06-09 MED ORDER — METOCLOPRAMIDE HCL 5 MG/ML IJ SOLN
INTRAMUSCULAR | Status: DC | PRN
  Administered 2013-06-09: 10 mg via INTRAVENOUS

## 2013-06-09 MED ORDER — METHOCARBAMOL 500 MG PO TABS
500.0000 mg | ORAL_TABLET | Freq: Four times a day (QID) | ORAL | Status: DC | PRN
  Administered 2013-06-10 – 2013-06-11 (×4): 500 mg via ORAL
  Filled 2013-06-09 (×4): qty 1

## 2013-06-09 MED ORDER — FENTANYL CITRATE 0.05 MG/ML IJ SOLN
INTRAMUSCULAR | Status: DC | PRN
  Administered 2013-06-09: 50 ug via INTRAVENOUS
  Administered 2013-06-09 (×2): 100 ug via INTRAVENOUS

## 2013-06-09 MED ORDER — BUPIVACAINE HCL (PF) 0.25 % IJ SOLN
INTRAMUSCULAR | Status: DC | PRN
  Administered 2013-06-09: 20 mL

## 2013-06-09 MED ORDER — SODIUM CHLORIDE 0.9 % IR SOLN
Status: DC | PRN
  Administered 2013-06-09: 1000 mL

## 2013-06-09 MED ORDER — DEXAMETHASONE SODIUM PHOSPHATE 10 MG/ML IJ SOLN
10.0000 mg | Freq: Every day | INTRAMUSCULAR | Status: AC
  Administered 2013-06-10: 10 mg via INTRAVENOUS
  Filled 2013-06-09: qty 1

## 2013-06-09 MED ORDER — FLEET ENEMA 7-19 GM/118ML RE ENEM: 1.0000 | ENEMA | Freq: Once | RECTAL | Status: AC | PRN

## 2013-06-09 MED ORDER — 0.9 % SODIUM CHLORIDE (POUR BTL) OPTIME
TOPICAL | Status: DC | PRN
  Administered 2013-06-09: 1000 mL

## 2013-06-09 MED ORDER — DEXAMETHASONE 6 MG PO TABS
10.0000 mg | ORAL_TABLET | Freq: Every day | ORAL | Status: AC
  Filled 2013-06-09: qty 1

## 2013-06-09 MED ORDER — SODIUM CHLORIDE 0.9 % IJ SOLN
INTRAMUSCULAR | Status: DC | PRN
  Administered 2013-06-09: 30 mL

## 2013-06-09 MED ORDER — POLYETHYLENE GLYCOL 3350 17 G PO PACK: 17.0000 g | PACK | Freq: Every day | ORAL | Status: DC | PRN

## 2013-06-09 MED ORDER — HYDROCHLOROTHIAZIDE 25 MG PO TABS
25.0000 mg | ORAL_TABLET | Freq: Every morning | ORAL | Status: DC
  Administered 2013-06-09 – 2013-06-11 (×3): 25 mg via ORAL
  Filled 2013-06-09 (×3): qty 1

## 2013-06-09 MED ORDER — CEFAZOLIN SODIUM-DEXTROSE 2-3 GM-% IV SOLR
2.0000 g | INTRAVENOUS | Status: AC
  Administered 2013-06-09: 2 g via INTRAVENOUS

## 2013-06-09 MED ORDER — LABETALOL HCL 100 MG PO TABS
100.0000 mg | ORAL_TABLET | Freq: Two times a day (BID) | ORAL | Status: DC
  Administered 2013-06-09 – 2013-06-11 (×4): 100 mg via ORAL
  Filled 2013-06-09 (×5): qty 1

## 2013-06-09 MED ORDER — ENOXAPARIN SODIUM 30 MG/0.3ML ~~LOC~~ SOLN
30.0000 mg | SUBCUTANEOUS | Status: DC
  Administered 2013-06-10 – 2013-06-11 (×2): 30 mg via SUBCUTANEOUS
  Filled 2013-06-09 (×4): qty 0.3

## 2013-06-09 MED ORDER — MORPHINE SULFATE 2 MG/ML IJ SOLN
1.0000 mg | INTRAMUSCULAR | Status: DC | PRN
  Administered 2013-06-09: 2 mg via INTRAVENOUS
  Administered 2013-06-09: 1 mg via INTRAVENOUS
  Filled 2013-06-09 (×2): qty 1

## 2013-06-09 MED ORDER — METOCLOPRAMIDE HCL 5 MG/ML IJ SOLN: 5.0000 mg | Freq: Three times a day (TID) | INTRAMUSCULAR | Status: DC | PRN

## 2013-06-09 MED ORDER — CEFAZOLIN SODIUM 1-5 GM-% IV SOLN
1.0000 g | Freq: Four times a day (QID) | INTRAVENOUS | Status: AC
  Administered 2013-06-09 – 2013-06-10 (×2): 1 g via INTRAVENOUS
  Filled 2013-06-09 (×2): qty 50

## 2013-06-09 MED ORDER — ONDANSETRON HCL 4 MG PO TABS: 4.0000 mg | ORAL_TABLET | Freq: Four times a day (QID) | ORAL | Status: DC | PRN

## 2013-06-09 MED ORDER — SUCCINYLCHOLINE CHLORIDE 20 MG/ML IJ SOLN
INTRAMUSCULAR | Status: DC | PRN
  Administered 2013-06-09: 100 mg via INTRAVENOUS

## 2013-06-09 MED ORDER — ONDANSETRON HCL 4 MG/2ML IJ SOLN
INTRAMUSCULAR | Status: DC | PRN
  Administered 2013-06-09: 4 mg via INTRAVENOUS

## 2013-06-09 MED ORDER — ALLOPURINOL 150 MG HALF TABLET
150.0000 mg | ORAL_TABLET | ORAL | Status: DC
  Administered 2013-06-09 – 2013-06-11 (×2): 150 mg via ORAL
  Filled 2013-06-09 (×2): qty 1

## 2013-06-09 MED ORDER — HYDROCHLOROTHIAZIDE 25 MG PO TABS: 25.0000 mg | ORAL_TABLET | Freq: Every morning | ORAL | Status: DC

## 2013-06-09 MED ORDER — DEXTROSE 5 % IV SOLN
500.0000 mg | Freq: Four times a day (QID) | INTRAVENOUS | Status: DC | PRN
  Administered 2013-06-09: 500 mg via INTRAVENOUS
  Filled 2013-06-09: qty 5

## 2013-06-09 MED ORDER — DIPHENHYDRAMINE HCL 12.5 MG/5ML PO ELIX: 12.5000 mg | ORAL_SOLUTION | ORAL | Status: DC | PRN

## 2013-06-09 MED ORDER — ACETAMINOPHEN 500 MG PO TABS
1000.0000 mg | ORAL_TABLET | Freq: Four times a day (QID) | ORAL | Status: AC
  Administered 2013-06-09 – 2013-06-10 (×4): 1000 mg via ORAL
  Filled 2013-06-09 (×4): qty 2

## 2013-06-09 MED ORDER — SODIUM CHLORIDE 0.9 % IV SOLN: INTRAVENOUS | Status: DC

## 2013-06-09 MED ORDER — PROMETHAZINE HCL 25 MG/ML IJ SOLN: 6.2500 mg | INTRAMUSCULAR | Status: DC | PRN

## 2013-06-09 MED ORDER — SODIUM CHLORIDE 0.9 % IV SOLN
INTRAVENOUS | Status: DC
  Administered 2013-06-09: 75 mL/h via INTRAVENOUS
  Administered 2013-06-10: 09:00:00 via INTRAVENOUS

## 2013-06-09 SURGICAL SUPPLY — 59 items
BAG SPEC THK2 15X12 ZIP CLS (MISCELLANEOUS) ×1
BAG ZIPLOCK 12X15 (MISCELLANEOUS) ×2 IMPLANT
BANDAGE ELASTIC 6 VELCRO ST LF (GAUZE/BANDAGES/DRESSINGS) ×2 IMPLANT
BANDAGE ESMARK 6X9 LF (GAUZE/BANDAGES/DRESSINGS) ×1 IMPLANT
BLADE SAG 18X100X1.27 (BLADE) ×2 IMPLANT
BLADE SAW SGTL 11.0X1.19X90.0M (BLADE) ×2 IMPLANT
BNDG CMPR 9X6 STRL LF SNTH (GAUZE/BANDAGES/DRESSINGS) ×1
BNDG ESMARK 6X9 LF (GAUZE/BANDAGES/DRESSINGS) ×2
BOWL SMART MIX CTS (DISPOSABLE) ×2 IMPLANT
CAPT RP KNEE ×1 IMPLANT
CEMENT HV SMART SET (Cement) ×4 IMPLANT
CLOTH BEACON ORANGE TIMEOUT ST (SAFETY) ×2 IMPLANT
CUFF TOURN SGL QUICK 34 (TOURNIQUET CUFF) ×2
CUFF TRNQT CYL 34X4X40X1 (TOURNIQUET CUFF) ×1 IMPLANT
DECANTER SPIKE VIAL GLASS SM (MISCELLANEOUS) ×2 IMPLANT
DRAPE EXTREMITY T 121X128X90 (DRAPE) ×2 IMPLANT
DRAPE POUCH INSTRU U-SHP 10X18 (DRAPES) ×2 IMPLANT
DRAPE U-SHAPE 47X51 STRL (DRAPES) ×2 IMPLANT
DRSG ADAPTIC 3X8 NADH LF (GAUZE/BANDAGES/DRESSINGS) ×2 IMPLANT
DRSG PAD ABDOMINAL 8X10 ST (GAUZE/BANDAGES/DRESSINGS) ×2 IMPLANT
DURAPREP 26ML APPLICATOR (WOUND CARE) ×2 IMPLANT
ELECT REM PT RETURN 9FT ADLT (ELECTROSURGICAL) ×2
ELECTRODE REM PT RTRN 9FT ADLT (ELECTROSURGICAL) ×1 IMPLANT
EVACUATOR 1/8 PVC DRAIN (DRAIN) ×2 IMPLANT
FACESHIELD LNG OPTICON STERILE (SAFETY) ×10 IMPLANT
GLOVE BIO SURGEON STRL SZ7.5 (GLOVE) ×1 IMPLANT
GLOVE BIO SURGEON STRL SZ8 (GLOVE) ×2 IMPLANT
GLOVE BIOGEL PI IND STRL 8 (GLOVE) ×1 IMPLANT
GLOVE BIOGEL PI INDICATOR 8 (GLOVE) ×3
GLOVE SURG SS PI 6.5 STRL IVOR (GLOVE) IMPLANT
GLOVE SURG SS PI 7.5 STRL IVOR (GLOVE) ×2 IMPLANT
GOWN BRE IMP PREV XXLGXLNG (GOWN DISPOSABLE) ×2 IMPLANT
GOWN STRL NON-REIN LRG LVL3 (GOWN DISPOSABLE) ×3 IMPLANT
GOWN STRL REIN XL XLG (GOWN DISPOSABLE) ×1 IMPLANT
HANDPIECE INTERPULSE COAX TIP (DISPOSABLE) ×2
IMMOBILIZER KNEE 20 (SOFTGOODS) ×2
IMMOBILIZER KNEE 20 THIGH 36 (SOFTGOODS) ×1 IMPLANT
KIT BASIN OR (CUSTOM PROCEDURE TRAY) ×2 IMPLANT
MANIFOLD NEPTUNE II (INSTRUMENTS) ×2 IMPLANT
NDL SAFETY ECLIPSE 18X1.5 (NEEDLE) ×2 IMPLANT
NEEDLE HYPO 18GX1.5 SHARP (NEEDLE) ×4
NS IRRIG 1000ML POUR BTL (IV SOLUTION) ×2 IMPLANT
PACK TOTAL JOINT (CUSTOM PROCEDURE TRAY) ×2 IMPLANT
PADDING CAST COTTON 6X4 STRL (CAST SUPPLIES) ×5 IMPLANT
POSITIONER SURGICAL ARM (MISCELLANEOUS) ×2 IMPLANT
SET HNDPC FAN SPRY TIP SCT (DISPOSABLE) ×1 IMPLANT
SPONGE GAUZE 4X4 12PLY (GAUZE/BANDAGES/DRESSINGS) ×2 IMPLANT
STRIP CLOSURE SKIN 1/2X4 (GAUZE/BANDAGES/DRESSINGS) ×4 IMPLANT
SUCTION FRAZIER 12FR DISP (SUCTIONS) ×2 IMPLANT
SUT MNCRL AB 4-0 PS2 18 (SUTURE) ×2 IMPLANT
SUT VIC AB 2-0 CT1 27 (SUTURE) ×6
SUT VIC AB 2-0 CT1 TAPERPNT 27 (SUTURE) ×3 IMPLANT
SUT VLOC 180 0 24IN GS25 (SUTURE) ×2 IMPLANT
SYR 20CC LL (SYRINGE) ×2 IMPLANT
SYR 50ML LL SCALE MARK (SYRINGE) ×2 IMPLANT
TOWEL OR 17X26 10 PK STRL BLUE (TOWEL DISPOSABLE) ×4 IMPLANT
TRAY FOLEY CATH 14FRSI W/METER (CATHETERS) ×2 IMPLANT
WATER STERILE IRR 1500ML POUR (IV SOLUTION) ×3 IMPLANT
WRAP KNEE MAXI GEL POST OP (GAUZE/BANDAGES/DRESSINGS) ×2 IMPLANT

## 2013-06-09 NOTE — Preoperative (Signed)
Beta Blockers   Reason not to administer Beta Blockers:Not Applicable, took BB this am 

## 2013-06-09 NOTE — Anesthesia Postprocedure Evaluation (Signed)
Anesthesia Post Note  Patient: Eileen Perez  Procedure(s) Performed: Procedure(s) (LRB): RIGHT TOTAL KNEE ARTHROPLASTY (Right)  Anesthesia type: General  Patient location: PACU  Post pain: Pain level controlled  Post assessment: Post-op Vital signs reviewed  Last Vitals:  Filed Vitals:   06/09/13 1630  BP: 183/82  Pulse: 73  Temp: 36.3 C  Resp: 12    Post vital signs: Reviewed  Level of consciousness: sedated  Complications: No apparent anesthesia complications

## 2013-06-09 NOTE — Anesthesia Preprocedure Evaluation (Signed)
Anesthesia Evaluation  Patient identified by MRN, date of birth, ID band Patient awake    Reviewed: Allergy & Precautions, H&P , NPO status , Patient's Chart, lab work & pertinent test results  Airway Mallampati: II TM Distance: >3 FB Neck ROM: Full    Dental  (+) Teeth Intact and Caps,    Pulmonary neg pulmonary ROS,  breath sounds clear to auscultation  Pulmonary exam normal       Cardiovascular hypertension, Pt. on medications Rhythm:Regular Rate:Normal     Neuro/Psych negative neurological ROS  negative psych ROS   GI/Hepatic negative GI ROS, Neg liver ROS,   Endo/Other  diabetes  Renal/GU Renal InsufficiencyRenal disease  negative genitourinary   Musculoskeletal negative musculoskeletal ROS (+)   Abdominal   Peds  Hematology negative hematology ROS (+)   Anesthesia Other Findings   Reproductive/Obstetrics                           Anesthesia Physical Anesthesia Plan  ASA: III  Anesthesia Plan: Spinal   Post-op Pain Management:    Induction:   Airway Management Planned: Simple Face Mask  Additional Equipment:   Intra-op Plan:   Post-operative Plan:   Informed Consent: I have reviewed the patients History and Physical, chart, labs and discussed the procedure including the risks, benefits and alternatives for the proposed anesthesia with the patient or authorized representative who has indicated his/her understanding and acceptance.   Dental advisory given  Plan Discussed with: CRNA  Anesthesia Plan Comments:         Anesthesia Quick Evaluation

## 2013-06-09 NOTE — Transfer of Care (Signed)
Immediate Anesthesia Transfer of Care Note  Patient: Eileen Perez  Procedure(s) Performed: Procedure(s): RIGHT TOTAL KNEE ARTHROPLASTY (Right)  Patient Location: PACU  Anesthesia Type:General, Regional and Spinal failure - switched to General.  Level of Consciousness: awake, alert , oriented and patient cooperative  Airway & Oxygen Therapy: Patient Spontanous Breathing and Patient connected to face mask oxygen  Post-op Assessment: Report given to PACU RN, Post -op Vital signs reviewed and stable and can wiggle toes and feel touch to feet.  Post vital signs: Reviewed and stable  Complications: No apparent anesthesia complications

## 2013-06-09 NOTE — Anesthesia Procedure Notes (Signed)
Spinal  Patient location during procedure: OR Start time: 06/09/2013 2:15 PM End time: 06/09/2013 2:20 PM Staffing Anesthesiologist: Freddie Apley F Performed by: anesthesiologist  Preanesthetic Checklist Completed: patient identified, site marked, surgical consent, pre-op evaluation, timeout performed, IV checked, risks and benefits discussed and monitors and equipment checked Spinal Block Patient position: sitting Prep: Betadine Patient monitoring: heart rate, continuous pulse ox and blood pressure Approach: midline Location: L3-4 Injection technique: single-shot Needle Needle type: Spinocan  Needle gauge: 22 G Needle length: 9 cm Additional Notes Expiration date of kit checked and confirmed. Patient tolerated procedure well, without complications. Negative heme/paresthesia Lot [id number] DOE 02/2014 Incomplete block despite good CSF return after 15 minute interval following SAB insertion. Elect to proceed with GETA

## 2013-06-09 NOTE — Op Note (Signed)
Pre-operative diagnosis- Osteoarthritis  Right knee(s)  Post-operative diagnosis- Osteoarthritis Right knee(s)  Procedure-  Right  Total Knee Arthroplasty  Surgeon- Dione Plover. Johnie Stadel, MD  Assistant- Arlee Muslim, PA-C   Anesthesia-  General EBL-* No blood loss amount entered *  Drains Hemovac  Tourniquet time-  32 minutes @ [id number] mm Hg Complications- None  Condition-PACU - hemodynamically stable.   Brief Clinical Note  Eileen Perez is a 72 y.o. year old female with end stage OA of her right knee with progressively worsening pain and dysfunction. She has constant pain, with activity and at rest and significant functional deficits with difficulties even with ADLs. She has had extensive non-op management including analgesics, injections of cortisone and viscosupplements, and home exercise program, but remains in significant pain with significant dysfunction.Radiographs show bone on bone arthritis medial and patellofemoral. She presents now for right Total Knee Arthroplasty.    Procedure in detail---   The patient is brought into the operating room and positioned supine on the operating table. After successful administration of  General,   a tourniquet is placed high on the  Right thigh(s) and the lower extremity is prepped and draped in the usual sterile fashion. Time out is performed by the operating team and then the  Right lower extremity is wrapped in Esmarch, knee flexed and the tourniquet inflated to 300 mmHg.       A midline incision is made with a ten blade through the subcutaneous tissue to the level of the extensor mechanism. A fresh blade is used to make a medial parapatellar arthrotomy. Soft tissue over the proximal medial tibia is subperiosteally elevated to the joint line with a knife and into the semimembranosus bursa with a Cobb elevator. Soft tissue over the proximal lateral tibia is elevated with attention being paid to avoiding the patellar tendon on the tibial tubercle. The  patella is everted, knee flexed 90 degrees and the ACL and PCL are removed. Findings are bone on bone medial and patellofemoral with massive osteophytes.        The drill is used to create a starting hole in the distal femur and the canal is thoroughly irrigated with sterile saline to remove the fatty contents. The 5 degree Right  valgus alignment guide is placed into the femoral canal and the distal femoral cutting block is pinned to remove 10 mm off the distal femur. Resection is made with an oscillating saw.      The tibia is subluxed forward and the menisci are removed. The extramedullary alignment guide is placed referencing proximally at the medial aspect of the tibial tubercle and distally along the second metatarsal axis and tibial crest. The block is pinned to remove 64mm off the more deficient medial  side. Resection is made with an oscillating saw. Size 2.5is the most appropriate size for the tibia and the proximal tibia is prepared with the modular drill and keel punch for that size.      The femoral sizing guide is placed and size 3 is most appropriate. Rotation is marked off the epicondylar axis and confirmed by creating a rectangular flexion gap at 90 degrees. The size 3 cutting block is pinned in this rotation and the anterior, posterior and chamfer cuts are made with the oscillating saw. The intercondylar block is then placed and that cut is made.      Trial size 2.5 tibial component, trial size 3 posterior stabilized femur and a 10  mm posterior stabilized rotating platform insert trial is  placed. Full extension is achieved with excellent varus/valgus and anterior/posterior balance throughout full range of motion. The patella is everted and thickness measured to be 22  mm. Free hand resection is taken to `12 mm, a 35 template is placed, lug holes are drilled, trial patella is placed, and it tracks normally. Osteophytes are removed off the posterior femur with the trial in place. All trials are  removed and the cut bone surfaces prepared with pulsatile lavage. Cement is mixed and once ready for implantation, the size 2.5 tibial implant, size  3 posterior stabilized femoral component, and the size 35 patella are cemented in place and the patella is held with the clamp. The trial insert is placed and the knee held in full extension. The Exparel (20 ml mixed with 30 ml saline) and .25% Bupivicaine, are injected into the extensor mechanism, posterior capsule, medial and lateral gutters and subcutaneous tissues.  All extruded cement is removed and once the cement is hard the permanent 10 mm posterior stabilized rotating platform insert is placed into the tibial tray.      The wound is copiously irrigated with saline solution and the extensor mechanism closed over a hemovac drain with #1 PDS suture. The tourniquet is released for a total tourniquet time of 32  minutes. Flexion against gravity is 140 degrees and the patella tracks normally. Subcutaneous tissue is closed with 2.0 vicryl and subcuticular with running 4.0 Monocryl. The incision is cleaned and dried and steri-strips and a bulky sterile dressing are applied. The limb is placed into a knee immobilizer and the patient is awakened and transported to recovery in stable condition.      Please note that a surgical assistant was a medical necessity for this procedure in order to perform it in a safe and expeditious manner. Surgical assistant was necessary to retract the ligaments and vital neurovascular structures to prevent injury to them and also necessary for proper positioning of the limb to allow for anatomic placement of the prosthesis.   Dione Plover Ante Arredondo, MD    06/09/2013, 3:28 PM

## 2013-06-09 NOTE — Interval H&P Note (Signed)
History and Physical Interval Note:  06/09/2013 10:52 AM  Eileen Perez  has presented today for surgery, with the diagnosis of Osteoarthritis of the Right knee  The various methods of treatment have been discussed with the patient and family. After consideration of risks, benefits and other options for treatment, the patient has consented to  Procedure(s): RIGHT TOTAL KNEE ARTHROPLASTY (Right) as a surgical intervention .  The patient's history has been reviewed, patient examined, no change in status, stable for surgery.  I have reviewed the patient's chart and labs.  Questions were answered to the patient's satisfaction.     Gearlean Alf

## 2013-06-10 ENCOUNTER — Encounter (HOSPITAL_COMMUNITY): Payer: Self-pay | Admitting: Orthopedic Surgery

## 2013-06-10 DIAGNOSIS — I10 Essential (primary) hypertension: Secondary | ICD-10-CM

## 2013-06-10 LAB — BASIC METABOLIC PANEL
Calcium: 9.6 mg/dL (ref 8.4–10.5)
GFR calc Af Amer: 20 mL/min — ABNORMAL LOW (ref >90)
GFR calc non Af Amer: 17 mL/min — ABNORMAL LOW (ref >90)
Sodium: 135 mEq/L (ref 135–145)

## 2013-06-10 LAB — CBC
MCH: 29.5 pg (ref 26.0–34.0)
MCHC: 32.1 g/dL (ref 30.0–36.0)
Platelets: 146 10*3/uL — ABNORMAL LOW (ref 150–400)
RBC: 3.02 MIL/uL — ABNORMAL LOW (ref 3.87–5.11)

## 2013-06-10 MED ORDER — FERROUS SULFATE CR 160 (50 FE) MG PO TBCR: 160.0000 mg | EXTENDED_RELEASE_TABLET | Freq: Every day | ORAL | Status: DC

## 2013-06-10 MED ORDER — FERROUS SULFATE 325 (65 FE) MG PO TABS
325.0000 mg | ORAL_TABLET | Freq: Every day | ORAL | Status: DC
  Administered 2013-06-10 – 2013-06-11 (×2): 325 mg via ORAL
  Filled 2013-06-10 (×3): qty 1

## 2013-06-10 NOTE — Progress Notes (Signed)
   Subjective: 1 Day Post-Op Procedure(s) (LRB): RIGHT TOTAL KNEE ARTHROPLASTY (Right) Patient reports pain as mild and moderate.   Patient seen in rounds with Dr. Wynelle Link. Patient is well, but has had some minor complaints of pain in the knee, requiring pain medications We will start therapy today.  Plan is to go Home after hospital stay.  Objective: Vital signs in last 24 hours: Temp:  [97.4 F (36.3 C)-98.4 F (36.9 C)] 98.3 F (36.8 C) (07/22 0627) Pulse Rate:  [66-81] 67 (07/22 0627) Resp:  [9-17] 16 (07/22 0627) BP: (166-197)/(75-98) 169/79 mmHg (07/22 0627) SpO2:  [100 %] 100 % (07/22 0627)  Intake/Output from previous day:  Intake/Output Summary (Last 24 hours) at 06/10/13 0757 Last data filed at 06/10/13 0700  Gross per 24 hour  Intake   2965 ml  Output   1315 ml  Net   1650 ml    Intake/Output this shift: UOP 550 since MN +1650  Labs:  Recent Labs  06/10/13 0434  HGB 8.9*    Recent Labs  06/10/13 0434  WBC 7.8  RBC 3.02*  HCT 27.7*  PLT 146*    Recent Labs  06/10/13 0434  NA 135  K 4.8  CL 104  CO2 24  BUN 50*  CREATININE 2.56*  GLUCOSE 211*  CALCIUM 9.6   No results found for this basename: LABPT, INR,  in the last 72 hours  EXAM General - Patient is Alert, Appropriate and Oriented Extremity - Neurovascular intact Sensation intact distally Dorsiflexion/Plantar flexion intact Dressing - dressing C/D/I Motor Function - intact, moving foot and toes well on exam.  Hemovac pulled without difficulty.  Past Medical History  Diagnosis Date  . Diabetes mellitus   . Hypertension   . Arthritis   . Anemia   . History of blood transfusion   . Chronic kidney disease     Assessment/Plan: 1 Day Post-Op Procedure(s) (LRB): RIGHT TOTAL KNEE ARTHROPLASTY (Right) Principal Problem:   OA (osteoarthritis) of knee Active Problems:   Postoperative anemia due to acute blood loss   Chronic kidney disease - monitor   Hypertension - meds  resumed  There is no weight on file to calculate BMI. Advance diet Up with therapy Plan for discharge tomorrow Discharge home with home health  DVT Prophylaxis - Lovenox Weight-Bearing as tolerated to right leg No vaccines. D/C O2 and Pulse OX and try on Room [address]  Mickel Crow 06/10/2013, 7:57 AM

## 2013-06-10 NOTE — Evaluation (Signed)
Occupational Therapy Evaluation Patient Details Name: Eileen Perez MRN: [id number] DOB: 08-20-1941 Today's Date: 06/10/2013 Time: [id number] OT Time Calculation (min): 25 min  OT Assessment / Plan / Recommendation History of present illness 72 yo female s/p R TKA. Hx of DM, OA, HTN, anemia.    Clinical Impression   Pt demos decline in function with ADLs and ADL mobility safety following R TKA. Pt is WBAT and would benefit from acute OT services to address impairments to help restore PLOF to return home safely    OT Assessment  Patient needs continued OT Services    Follow Up Recommendations  Supervision/Assistance - 24 hour    Barriers to Discharge   None  Equipment Recommendations  3 in 1 bedside comode;Tub/shower bench;Other (comment) (ADL A/E)    Recommendations for Other Services    Frequency  Min 2X/week    Precautions / Restrictions Precautions Precautions: Knee Required Braces or Orthoses: Knee Immobilizer - Right Knee Immobilizer - Right: Discontinue once straight leg raise with < 10 degree lag Restrictions Weight Bearing Restrictions: No RLE Weight Bearing: Weight bearing as tolerated Other Position/Activity Restrictions: WBAT   Pertinent Vitals/Pain 5-6/10 R knee    ADL  Grooming: Performed;Wash/dry hands;Wash/dry face;Min guard Where Assessed - Grooming: Supported standing Upper Body Bathing: Simulated;Supervision/safety;Set up Lower Body Bathing: Simulated;Moderate assistance Upper Body Dressing: Performed;Supervision/safety;Set up Lower Body Dressing: Moderate assistance;Performed Toilet Transfer: Performed;Minimal Manufacturing systems engineer: Grab bars;Raised toilet seat with arms (or 3-in-1 over toilet) Toileting - Clothing Manipulation and Hygiene: Performed;Minimal assistance Where Assessed - Camera operator Manipulation and Hygiene: Standing Tub/Shower Transfer Method: Not assessed Equipment Used: Long-handled shoe horn;Long-handled  sponge;Reacher;Gait belt;Rolling walker;Knee Immobilizer;Sock aid ADL Comments: Pt and spouse provided with education and demo of ADL A/E for home use. Educated on tub bench use with picture shown    OT Diagnosis: Generalized weakness;Acute pain  OT Problem List: Decreased strength;Decreased knowledge of use of DME or AE;Decreased knowledge of precautions;Decreased activity tolerance;Pain;Impaired balance (sitting and/or standing) OT Treatment Interventions: Self-care/ADL training;Therapeutic exercise;Patient/family education;Neuromuscular education;Therapeutic activities;DME and/or AE instruction   OT Goals(Current goals can be found in the care plan section) Acute Rehab OT Goals Patient Stated Goal: " To retun home " Time For Goal Achievement: 06/17/13 Potential to Achieve Goals: Good ADL Goals Pt Will Perform Grooming: with set-up;with supervision;standing;with caregiver independent in assisting Pt Will Perform Lower Body Bathing: with min assist;with caregiver independent in assisting;with adaptive equipment Pt Will Perform Lower Body Dressing: with min assist;with caregiver independent in assisting;with adaptive equipment Pt Will Transfer to Toilet: with min guard assist;with supervision;grab bars (with 3 in 1 over toilet) Pt Will Perform Toileting - Clothing Manipulation and hygiene: with supervision;sitting/lateral leans;sit to/from stand Pt Will Perform Tub/Shower Transfer: with supervision;with min guard assist;tub bench;grab bars;with caregiver independent in assisting  Visit Information  Last OT Received On: 06/10/13 Assistance Needed: +1 PT/OT Co-Evaluation/Treatment: Yes History of Present Illness: 72 yo female s/p R TKA. Hx of DM, OA, HTN, anemia.        Prior [place] expects to be discharged to:: Private residence Living Arrangements: Spouse/significant other Available Help at Discharge: Family;Friend(s) Type of Home: House Home  Access: Level entry Home Layout: One [place]: [place] - 2 wheels Additional Comments: tub/shower. standard height toilet.  Prior Function Level of Independence: Independent Communication Communication: No difficulties Dominant Hand: Right         Vision/Perception Vision - History Baseline Vision: Wears glasses only for reading Patient Visual  Report: No change from baseline Perception Perception: Within Functional Limits   Cognition  Cognition Arousal/Alertness: Awake/alert Behavior During Therapy: WFL for tasks assessed/performed Overall Cognitive Status: Within Functional Limits for tasks assessed    Extremity/Trunk Assessment Upper Extremity Assessment Upper Extremity Assessment: WFL Cervical / Trunk Assessment Cervical / Trunk Assessment: Normal     Mobility Bed Mobility Bed Mobility: Supine to Sit Supine to Sit: 4: Min assist Sitting - Scoot to Edge of Bed: 5: Supervision Details for Bed Mobility Assistance: Assist for R LE.  Transfers Transfers: Sit to Stand;Stand to Sit Sit to Stand: 4: Min assist;From bed Stand to Sit: 4: Min assist;To chair/3-in-1;With armrests Details for Transfer Assistance: Assist to rise, stabilize, control descent. VCS safety, technique, hand placement.      Exercise     Balance Balance Balance Assessed: Yes Dynamic Standing Balance Dynamic Standing - Balance Support: No upper extremity supported;During functional activity Dynamic Standing - Level of Assistance: 4: Min assist;5: Stand by assistance   End of Session OT - End of Session Equipment Utilized During Treatment: Gait belt;Rolling walker;Right knee immobilizer;Other (comment) (ADL A/E) Activity Tolerance: Patient tolerated treatment well Patient left: in chair;with call bell/phone within reach;with family/visitor present CPM Right Knee CPM Right Knee: Off  GO     Emmit Alexanders [facility] 06/10/2013, 11:31 AM

## 2013-06-10 NOTE — Plan of Care (Signed)
Problem: Phase II Progression Outcomes Goal: Discharge plan established Recommend [place] OT for ADL trg and ADL mobility safety trg after acute care d/c

## 2013-06-10 NOTE — Evaluation (Signed)
Physical Therapy Evaluation Patient Details Name: Eileen Perez MRN: [id number] DOB: 1941-04-14 Today's Date: 06/10/2013 Time: [id number] PT Time Calculation (min): 27 min  PT Assessment / Plan / Recommendation History of Present Illness  72 yo female s/p R TKA. Hx of DM, OA, HTN, anemia.   Clinical Impression  On eval, pt required Min assist for mobility-able to ambulate ~75 feet with RW. Anticipate pt will progress well during stay. Recommend HHPT.     PT Assessment  Patient needs continued PT services    Follow Up Recommendations  Home health PT    Does the patient have the potential to tolerate intense rehabilitation      Barriers to Discharge        Equipment Recommendations  None recommended by PT    Recommendations for Other Services OT consult   Frequency 7X/week    Precautions / Restrictions Precautions Precautions: Knee Required Braces or Orthoses: Knee Immobilizer - Right Knee Immobilizer - Right: Discontinue once straight leg raise with < 10 degree lag Restrictions Weight Bearing Restrictions: No RLE Weight Bearing: Weight bearing as tolerated Other Position/Activity Restrictions: WBAT   Pertinent Vitals/Pain 6/10 R knee with activity      Mobility  Bed Mobility Bed Mobility: Supine to Sit Supine to Sit: 4: Min assist Sitting - Scoot to Edge of Bed: 5: Supervision Details for Bed Mobility Assistance: Assist for R LE.  Transfers Transfers: Sit to Stand;Stand to Sit Sit to Stand: 4: Min assist;From bed Stand to Sit: 4: Min assist;To chair/3-in-1;With armrests Details for Transfer Assistance: Assist to rise, stabilize, control descent. VCS safety, technique, hand placement.  Ambulation/Gait Ambulation/Gait Assistance: 4: Min assist Ambulation Distance (Feet): 75 Feet Assistive device: Rolling walker Ambulation/Gait Assistance Details: VCs safety, technique, sequence. Good gait speed.  Gait Pattern: Step-to pattern;Decreased stride length     Exercises     PT Diagnosis: Difficulty walking;Abnormality of gait;Acute pain  PT Problem List: Decreased strength;Decreased range of motion;Decreased activity tolerance;Decreased mobility;Pain;Decreased knowledge of use of DME PT Treatment Interventions: DME instruction;Gait training;Functional mobility training;Therapeutic activities;Therapeutic exercise;Patient/family education     PT Goals(Current goals can be found in the care plan section) Acute Rehab PT Goals Patient Stated Goal: " To retun home " PT Goal Formulation: With patient Time For Goal Achievement: 06/17/13 Potential to Achieve Goals: Good  Visit Information  Last PT Received On: 06/10/13 Assistance Needed: +1 History of Present Illness: 72 yo female s/p R TKA. Hx of DM, OA, HTN, anemia.        Prior [place] expects to be discharged to:: Private residence Living Arrangements: Spouse/significant other Available Help at Discharge: Family;Friend(s) Type of Home: House Home Access: Level entry Home Layout: One [place]: [place] - 2 wheels Additional Comments: tub/shower. standard height toilet.  Prior Function Level of Independence: Independent Communication Communication: No difficulties Dominant Hand: Right    Cognition  Cognition Arousal/Alertness: Awake/alert Behavior During Therapy: WFL for tasks assessed/performed Overall Cognitive Status: Within Functional Limits for tasks assessed    Extremity/Trunk Assessment Upper Extremity Assessment Upper Extremity Assessment: Defer to OT evaluation Lower Extremity Assessment Lower Extremity Assessment: RLE deficits/detail RLE Deficits / Details: hip flex 3/5, moves ankle well Cervical / Trunk Assessment Cervical / Trunk Assessment: Normal   Balance Balance Balance Assessed: Yes Dynamic Standing Balance Dynamic Standing - Balance Support: No upper extremity supported;During functional activity Dynamic Standing -  Level of Assistance: 4: Min assist;5: Stand by assistance  End of Session PT - End of Session  Equipment Utilized During Treatment: Gait belt Activity Tolerance: Patient tolerated treatment well Patient left: in chair;with call bell/phone within reach CPM Right Knee CPM Right Knee: Off  GP     Weston Anna, MPT Pager: [phone]

## 2013-06-10 NOTE — Progress Notes (Signed)
Inpatient Diabetes Program Recommendations  AACE/ADA: New Consensus Statement on Inpatient Glycemic Control (2013)  Target Ranges:  Prepandial:   less than 140 mg/dL      Peak postprandial:   less than 180 mg/dL (1-2 hours)      Critically ill patients:  140 - 180 mg/dL    Results for Eileen Perez, Eileen Perez (MRN [id number]) as of 06/10/2013 09:26  Ref. Range 06/10/2013 04:34  Glucose Latest Range: 70-99 mg/dL 211 (H)    Inpatient Diabetes Program Recommendations Correction (SSI): Please start Novolog Sensitive correction scale (SSI) tid ac + HS (while patient on IV steroids). Diet: Please change diet to Carbohydrate Modified Medium (currently on Regular diet with no Carbohydrate restrictions).  Will follow. Wyn Quaker RN, MSN, CDE Diabetes Coordinator Inpatient Diabetes Program [phone]

## 2013-06-10 NOTE — Clinical Documentation Improvement (Addendum)
THIS DOCUMENT IS NOT A PERMANENT PART OF THE MEDICAL RECORD  Please update your documentation with the medical record to reflect your response to this query. If you need help knowing how to do this please call [phone].  06/10/13   Dear  Eileen Perez,:/Associates,  In a better effort to capture your patient's severity of illness, reflect appropriate length of stay and utilization of resources, a review of the patient medical record has revealed the following indicators.    Based on your clinical judgment, please clarify and document in a progress note and/or discharge summary the clinical condition associated with the following supporting information:  In responding to this query please exercise your independent judgment.  The fact that a query is asked, does not imply that any particular answer is desired or expected.  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   _______CKD Stage I -  GFR > OR = 90 _______CKD Stage II - GFR 60-80 _______CKD Stage III - GFR 30-59 ___X____CKD Stage IV - GFR 15-29  Based upon the admission lab values _______CKD Stage V - GFR < 15 _______Acute Renal Failure _______Other condition_____________ _______Cannot Clinically determine   Supporting Information:  Risk Factors:  DJD Chronic Kidney Disease per pn 06/10/13 ABLA S/P R TKA   Signs & Symptoms:    Diagnostics:  Component     Latest Ref Rng 06/10/2013          BUN     6 - 23 mg/dL 50 (H)    Component     Latest Ref Rng 06/10/2013          GFR calc Af Amer     >90 mL/min 20 (L)    Treatment: Monitoring  You may use possible, probable, or suspect with inpatient documentation. possible, probable, suspected diagnoses MUST be documented at the time of discharge  Reviewed:  no additional documentation provided ljh   Thank You,  Heloise Beecham  RN, BSN, MSN/Inf, CCDS Clinical Documentation Specialist [facility] Dept Pager: [phone] / E-mail: [email or web address]  [phone] Health Information Management [place]  Information added to discharge note and placed into the DC Summary.  Arlee Muslim, PA-C

## 2013-06-10 NOTE — Progress Notes (Signed)
Physical Therapy Treatment Patient Details Name: Eileen Perez MRN: [id number] DOB: January 18, 1941 Today's Date: 06/10/2013 Time: [id number] PT Time Calculation (min): 33 min  PT Assessment / Plan / Recommendation  History of Present Illness 72 yo female s/p R TKA. Hx of DM, OA, HTN, anemia.    Clinical Impression    PT Comments   Progressing. Plan is for home tomorrow.   Follow Up Recommendations  Home health PT     Does the patient have the potential to tolerate intense rehabilitation     Barriers to Discharge        Equipment Recommendations  None recommended by PT    Recommendations for Other Services OT consult  Frequency 7X/week   Progress towards PT Goals Progress towards PT goals: Progressing toward goals  Plan Current plan remains appropriate    Precautions / Restrictions Precautions Precautions: Knee Required Braces or Orthoses: Knee Immobilizer - Right Knee Immobilizer - Right: Discontinue once straight leg raise with < 10 degree lag Restrictions Weight Bearing Restrictions: No RLE Weight Bearing: Weight bearing as tolerated   Pertinent Vitals/Pain 5/10 R knee    Mobility  Bed Mobility Bed Mobility: Sit to Supine Sit to Supine: 4: Min assist Details for Bed Mobility Assistance: Assist for R LE.  Transfers Transfers: Sit to Stand;Stand to Sit Sit to Stand: 4: Min guard;From chair/3-in-1;From toilet Stand to Sit: 4: Min guard;To toilet;To bed Details for Transfer Assistance:  VCS safety, technique, hand placement.  Ambulation/Gait Ambulation/Gait Assistance: 4: Min guard Ambulation Distance (Feet): 215 Feet Assistive device: Rolling walker Gait Pattern: Decreased stride length;Step-through pattern    Exercises Total Joint Exercises Ankle Circles/Pumps: AROM;Both;10 reps;Supine Quad Sets: AROM;Both;10 reps;Supine Heel Slides: AAROM;Right;10 reps;Supine Hip ABduction/ADduction: Right;10 reps;Supine;AROM Straight Leg Raises: AROM;Right;10 reps;Supine    PT Diagnosis:    PT Problem List:   PT Treatment Interventions:     PT Goals (current goals can now be found in the care plan section)    Visit Information  Last PT Received On: 06/10/13 Assistance Needed: +1 History of Present Illness: 72 yo female s/p R TKA. Hx of DM, OA, HTN, anemia.     Subjective Data      Cognition  Cognition Arousal/Alertness: Awake/alert Behavior During Therapy: WFL for tasks assessed/performed Overall Cognitive Status: Within Functional Limits for tasks assessed    Balance     End of Session PT - End of Session Equipment Utilized During Treatment: Gait belt Activity Tolerance: Patient tolerated treatment well Patient left: in bed;with call bell/phone within reach;with family/visitor present CPM Right Knee CPM Right Knee: On   GP     Weston Anna, MPT Pager: [phone]

## 2013-06-10 NOTE — Progress Notes (Signed)
Utilization review completed.  

## 2013-06-11 LAB — CBC
MCH: 29.2 pg (ref 26.0–34.0)
MCV: 89.1 fL (ref 78.0–100.0)
Platelets: 124 10*3/uL — ABNORMAL LOW (ref 150–400)
RDW: 13.6 % (ref 11.5–15.5)

## 2013-06-11 LAB — BASIC METABOLIC PANEL
Calcium: 9.9 mg/dL (ref 8.4–10.5)
Creatinine, Ser: 2.47 mg/dL — ABNORMAL HIGH (ref 0.50–1.10)
GFR calc Af Amer: 21 mL/min — ABNORMAL LOW (ref >90)

## 2013-06-11 LAB — GLUCOSE, CAPILLARY: Glucose-Capillary: 149 mg/dL — ABNORMAL HIGH (ref 70–99)

## 2013-06-11 MED ORDER — METHOCARBAMOL 500 MG PO TABS: 500.0000 mg | ORAL_TABLET | Freq: Four times a day (QID) | ORAL | Status: DC | PRN

## 2013-06-11 MED ORDER — ENOXAPARIN (LOVENOX) PATIENT EDUCATION KIT
PACK | Freq: Once | Status: AC
  Administered 2013-06-11: 12:00:00
  Filled 2013-06-11: qty 1

## 2013-06-11 MED ORDER — FERROUS SULFATE 325 (65 FE) MG PO TABS
325.0000 mg | ORAL_TABLET | Freq: Three times a day (TID) | ORAL | Status: DC
  Filled 2013-06-11 (×3): qty 1

## 2013-06-11 MED ORDER — ENOXAPARIN SODIUM 30 MG/0.3ML ~~LOC~~ SOLN: 30.0000 mg | SUBCUTANEOUS | Status: DC

## 2013-06-11 MED ORDER — FERROUS SULFATE CR 160 (50 FE) MG PO TBCR: 160.0000 mg | EXTENDED_RELEASE_TABLET | Freq: Three times a day (TID) | ORAL | Status: DC

## 2013-06-11 MED ORDER — OXYCODONE HCL 5 MG PO TABS: 5.0000 mg | ORAL_TABLET | ORAL | Status: DC | PRN

## 2013-06-11 NOTE — Progress Notes (Signed)
Occupational Therapy Treatment Patient Details Name: Eileen Perez MRN: [id number] DOB: 1941-03-19 Today's Date: 06/11/2013 Time: [id number] OT Time Calculation (min): 18 min  OT Assessment / Plan / Recommendation  History of present illness 72 yo female s/p R TKA. Hx of DM, OA, HTN, anemia.        OT comments  Pt making good progress. Further education provided on functional transfer safety and DME. Pt states spouse can assist at d/c PRN.   Follow Up Recommendations  Supervision/Assistance - 24 hour    Barriers to Discharge       Equipment Recommendations  3 in 1 bedside comode (pt decided to sponge initially. no tub bench requested)    Recommendations for Other Services    Frequency Min 2X/week   Progress towards OT Goals Progress towards OT goals: Progressing toward goals  Plan Discharge plan remains appropriate    Precautions / Restrictions Precautions Precautions: Knee Required Braces or Orthoses: Knee Immobilizer - Right Knee Immobilizer - Right: Other (comment) (Pt able to SLR. discontinued KI) Restrictions Weight Bearing Restrictions: No RLE Weight Bearing: Weight bearing as tolerated   Pertinent Vitals/Pain No c/o    ADL  Grooming: Performed;Wash/dry hands;Supervision/safety Where Assessed - Grooming: Unsupported standing Lower Body Dressing: Simulated;Min guard for socks only and sit to stand aspect. Where Assessed - Lower Body Dressing: Supported sit to stand Toilet Transfer: Performed;Min Psychiatric nurse Method: Other (comment) (with walker into bathroom) Toilet Transfer Equipment: Raised toilet seat with arms (or 3-in-1 over toilet) Toileting - Clothing Manipulation and Hygiene: Simulated;Min guard Where Assessed - Toileting Clothing Manipulation and Hygiene: Sit to stand from 3-in-1 or toilet ADL Comments: Pt now able to reach all the way down to R foot so no AE needed. Discussed at length the tub transfer bench and pt has decided to hold off on  purchasing it at this time and will sponge bathe initially. Discussed how to adjust 3in1 to appropriate height so her feet touch the floor when seated on it. Pt needed min cues for hand  placement to reach back for armrests when descending to toilet seat.     OT Diagnosis:    OT Problem List:   OT Treatment Interventions:     OT Goals(current goals can now be found in the care plan section) Acute Rehab OT Goals Patient Stated Goal: " To retun home " Potential to Achieve Goals: Good  Visit Information  Last OT Received On: 06/11/13 Assistance Needed: +1 History of Present Illness: 72 yo female s/p R TKA. Hx of DM, OA, HTN, anemia.     Subjective Data      Prior Functioning       Cognition  Cognition Arousal/Alertness: Awake/alert Behavior During Therapy: WFL for tasks assessed/performed Overall Cognitive Status: Within Functional Limits for tasks assessed    Mobility  Bed Mobility Bed Mobility: Supine to Sit Supine to Sit: 4: Min assist Details for Bed Mobility Assistance: Assist for R LE.  Transfers Transfers: Stand to Sit Sit to Stand: 5: Supervision;With upper extremity assist;From chair/3-in-1 Stand to Sit: 4: Min guard;With upper extremity assist;To chair/3-in-1 Details for Transfer Assistance: verbal cues for safety.       Balance Balance Balance Assessed: Yes Dynamic Standing Balance Dynamic Standing - Balance Support: No upper extremity supported Dynamic Standing - Level of Assistance: 5: Stand by assistance   End of Session OT - End of Session Equipment Utilized During Treatment: Rolling walker Activity Tolerance: Patient tolerated treatment well Patient left: in chair;with call bell/phone  within reach;with family/visitor present CPM Right Knee CPM Right Knee: Off  [place], [place] [id number] 06/11/2013, 10:43 AM

## 2013-06-11 NOTE — Discharge Summary (Signed)
Physician Discharge Summary   Patient ID: Eileen Perez MRN: [id number] DOB/AGE: October 10, 1941 72 y.o.  Admit date: 06/09/2013 Discharge date: 06/11/2013  Primary Diagnosis:  Osteoarthritis Right knee  Admission Diagnoses:  Past Medical History  Diagnosis Date  . Diabetes mellitus   . Hypertension   . Arthritis   . Anemia   . History of blood transfusion   . 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   Weight as of this encounter: 63.504 kg (140 lb).  Procedure:  Procedure(s) (LRB): RIGHT TOTAL KNEE ARTHROPLASTY (Right)   Consults: None  HPI: Eileen Perez is a 73 y.o. year old female with end stage OA of her right knee with progressively worsening pain and dysfunction. She has constant pain, with activity and at rest and significant functional deficits with difficulties even with ADLs. She has had extensive non-op management including analgesics, injections of cortisone and viscosupplements, and home exercise program, but remains in significant pain with significant dysfunction.Radiographs show bone on bone arthritis medial and patellofemoral. She presents now for right Total Knee Arthroplasty.   Laboratory Data: Admission on 06/09/2013  Component Date Value Range Status  . Glucose-Capillary 06/09/2013 105* 70 - 99 mg/dL Final  . Comment 1 06/09/2013 Documented in Chart   Final  . Glucose-Capillary 06/09/2013 90  70 - 99 mg/dL Final  . WBC 06/10/2013 7.8  4.0 - 10.5 K/uL Final  . RBC 06/10/2013 3.02* 3.87 - 5.11 MIL/uL Final  . Hemoglobin 06/10/2013 8.9* 12.0 - 15.0 g/dL Final  . HCT 06/10/2013 27.7* 36.0 - 46.0 % Final  . MCV 06/10/2013 91.7   78.0 - 100.0 fL Final  . MCH 06/10/2013 29.5  26.0 - 34.0 pg Final  . MCHC 06/10/2013 32.1  30.0 - 36.0 g/dL Final  . RDW 06/10/2013 13.4  11.5 - 15.5 % Final  . Platelets 06/10/2013 146* 150 - 400 K/uL Final  . Sodium 06/10/2013 135  135 - 145 mEq/L Final  . Potassium 06/10/2013 4.8  3.5 - 5.1 mEq/L Final  . Chloride 06/10/2013 104  96 - 112 mEq/L Final  . CO2 06/10/2013 24  19 - 32 mEq/L Final  . Glucose, Bld 06/10/2013 211* 70 - 99 mg/dL Final  . BUN 06/10/2013 50* 6 - 23 mg/dL Final  . Creatinine, Ser 06/10/2013 2.56* 0.50 - 1.10 mg/dL Final  . Calcium 06/10/2013 9.6  8.4 - 10.5 mg/dL Final  . GFR calc non Af Amer 06/10/2013 17* >90 mL/min Final  . GFR calc Af Amer 06/10/2013 20* >90 mL/min Final   Comment:                                 The eGFR has been calculated                          using the CKD EPI equation.                          This calculation has not been  validated in all clinical                          situations.                          eGFR's persistently                          <90 mL/min signify                          possible Chronic Kidney Disease.  . Glucose-Capillary 06/10/2013 175* 70 - 99 mg/dL Final  . Comment 1 06/10/2013 Notify RN   Final  . WBC 06/11/2013 6.5  4.0 - 10.5 K/uL Final  . RBC 06/11/2013 2.67* 3.87 - 5.11 MIL/uL Final  . Hemoglobin 06/11/2013 7.8* 12.0 - 15.0 g/dL Final  . HCT 06/11/2013 23.8* 36.0 - 46.0 % Final  . MCV 06/11/2013 89.1  78.0 - 100.0 fL Final  . MCH 06/11/2013 29.2  26.0 - 34.0 pg Final  . MCHC 06/11/2013 32.8  30.0 - 36.0 g/dL Final  . RDW 06/11/2013 13.6  11.5 - 15.5 % Final  . Platelets 06/11/2013 124* 150 - 400 K/uL Final  . Sodium 06/11/2013 133* 135 - 145 mEq/L Final  . Potassium 06/11/2013 4.4  3.5 - 5.1 mEq/L Final  . Chloride 06/11/2013 102  96 - 112 mEq/L Final  . CO2 06/11/2013 23  19 - 32 mEq/L Final  . Glucose, Bld 06/11/2013 160* 70 - 99 mg/dL Final  . BUN 06/11/2013  49* 6 - 23 mg/dL Final  . Creatinine, Ser 06/11/2013 2.47* 0.50 - 1.10 mg/dL Final  . Calcium 06/11/2013 9.9  8.4 - 10.5 mg/dL Final  . GFR calc non Af Amer 06/11/2013 18* >90 mL/min Final  . GFR calc Af Amer 06/11/2013 21* >90 mL/min Final   Comment:                                 The eGFR has been calculated                          using the CKD EPI equation.                          This calculation has not been                          validated in all clinical                          situations.                          eGFR's persistently                          <90 mL/min signify                          possible Chronic Kidney Disease.  . Glucose-Capillary 06/11/2013 149* 70 - 99 mg/dL Final  Hospital Outpatient Visit on 06/02/2013  Component Date Value Range Status  .  aPTT 06/02/2013 32  24 - 37 seconds Final  . WBC 06/02/2013 5.4  4.0 - 10.5 K/uL Final  . RBC 06/02/2013 3.83* 3.87 - 5.11 MIL/uL Final  . Hemoglobin 06/02/2013 11.2* 12.0 - 15.0 g/dL Final  . HCT 06/02/2013 35.1* 36.0 - 46.0 % Final  . MCV 06/02/2013 91.6  78.0 - 100.0 fL Final  . MCH 06/02/2013 29.2  26.0 - 34.0 pg Final  . MCHC 06/02/2013 31.9  30.0 - 36.0 g/dL Final  . RDW 06/02/2013 13.9  11.5 - 15.5 % Final  . Platelets 06/02/2013 173  150 - 400 K/uL Final  . Sodium 06/02/2013 139  135 - 145 mEq/L Final  . Potassium 06/02/2013 4.5  3.5 - 5.1 mEq/L Final  . Chloride 06/02/2013 104  96 - 112 mEq/L Final  . CO2 06/02/2013 24  19 - 32 mEq/L Final  . Glucose, Bld 06/02/2013 118* 70 - 99 mg/dL Final  . BUN 06/02/2013 54* 6 - 23 mg/dL Final  . Creatinine, Ser 06/02/2013 2.32* 0.50 - 1.10 mg/dL Final  . Calcium 06/02/2013 10.5  8.4 - 10.5 mg/dL Final  . Total Protein 06/02/2013 7.2  6.0 - 8.3 g/dL Final  . Albumin 06/02/2013 3.4* 3.5 - 5.2 g/dL Final  . AST 06/02/2013 29  0 - 37 U/L Final  . ALT 06/02/2013 27  0 - 35 U/L Final  . Alkaline Phosphatase 06/02/2013 179* 39 - 117 U/L Final  . Total  Bilirubin 06/02/2013 0.3  0.3 - 1.2 mg/dL Final  . GFR calc non Af Amer 06/02/2013 20* >90 mL/min Final  . GFR calc Af Amer 06/02/2013 23* >90 mL/min Final   Comment:                                 The eGFR has been calculated                          using the CKD EPI equation.                          This calculation has not been                          validated in all clinical                          situations.                          eGFR's persistently                          <90 mL/min signify                          possible Chronic Kidney Disease.  Marland Kitchen Prothrombin Time 06/02/2013 12.9  11.6 - 15.2 seconds Final  . INR 06/02/2013 0.99  0.00 - 1.49 Final  . ABO/RH(D) 06/02/2013 O POS   Final  . Antibody Screen 06/02/2013 NEG   Final  . Sample Expiration 06/02/2013 06/12/2013   Final  . Color, Urine 06/02/2013 YELLOW  YELLOW Final  . APPearance 06/02/2013 CLEAR  CLEAR Final  . Specific Gravity, Urine 06/02/2013 1.017  1.005 - 1.030 Final  .  pH 06/02/2013 5.0  5.0 - 8.0 Final  . Glucose, UA 06/02/2013 NEGATIVE  NEGATIVE mg/dL Final  . Hgb urine dipstick 06/02/2013 NEGATIVE  NEGATIVE Final  . Bilirubin Urine 06/02/2013 NEGATIVE  NEGATIVE Final  . Ketones, ur 06/02/2013 NEGATIVE  NEGATIVE mg/dL Final  . Protein, ur 06/02/2013 100* NEGATIVE mg/dL Final  . Urobilinogen, UA 06/02/2013 0.2  0.0 - 1.0 mg/dL Final  . Nitrite 06/02/2013 NEGATIVE  NEGATIVE Final  . Leukocytes, UA 06/02/2013 NEGATIVE  NEGATIVE Final  . MRSA, PCR 06/02/2013 NEGATIVE  NEGATIVE Final  . Staphylococcus aureus 06/02/2013 NEGATIVE  NEGATIVE Final   Comment:                                 The Xpert SA Assay (FDA                          approved for NASAL specimens                          in patients over 1 years of age),                          is one component of                          a comprehensive surveillance                          program.  Test performance has                           been validated by [employer] for patients greater                          than or equal to 69 year old.                          It is not intended                          to diagnose infection nor to                          guide or monitor treatment.  . Urine-Other 06/02/2013 NO FORMED ELEMENTS SEEN ON URINE MICROSCOPIC EXAMINATION   Final     X-Rays:Dg Chest 2 View  06/02/2013   *RADIOLOGY REPORT*  Clinical Data: Preop for right knee arthroplasty  CHEST - 2 VIEW  Comparison: 01/09/2011  Findings: Cardiomediastinal silhouette is stable.  No acute infiltrate or pleural effusion.  No pulmonary edema.  Stable degenerative changes thoracic spine.  IMPRESSION: No active disease.  No significant change.   Original Report Authenticated By: Lahoma Crocker, M.D.    EKG: Orders placed during the hospital encounter of 06/02/13  . EKG 12-LEAD  . EKG 12-LEAD     Hospital Course: Eileen Perez is a 72 y.o. who was admitted to [facility]. They were brought to the  operating room on 06/09/2013 and underwent Procedure(s): RIGHT TOTAL KNEE ARTHROPLASTY.  Patient tolerated the procedure well and was later transferred to the recovery room and then to the orthopaedic floor for postoperative care.  They were given PO and IV analgesics for pain control following their surgery.  They were given 24 hours of postoperative antibiotics of  Anti-infectives   Start     Dose/Rate Route Frequency Ordered Stop   06/09/13 2000  ceFAZolin (ANCEF) IVPB 1 g/50 mL premix     1 g 100 mL/hr over 30 Minutes Intravenous Every 6 hours 06/09/13 1724 06/10/13 0255   06/09/13 1100  ceFAZolin (ANCEF) IVPB 2 g/50 mL premix    Comments:  Dose decreased to 2g per P&T policy for weight < [id number].   2 g 100 mL/hr over 30 Minutes Intravenous On call to O.R. 06/09/13 1049 06/09/13 1406     and started on DVT prophylaxis in the form of Lovenox.   PT and OT were ordered for total joint protocol.  Discharge  planning consulted to help with postop disposition and equipment needs.  Patient had a good night on the evening of surgery.  They started to get up OOB with therapy on day one and walked about 75 feet. Hemovac drain was pulled without difficulty.  Continued to work with therapy into day two.  Dressing was changed on day two and the incision was healing well.  Patient was seen in rounds and was ready to go home later that same day.   Discharge Medications: Prior to Admission medications   Medication Sig Start Date End Date Taking? Authorizing Provider  labetalol (NORMODYNE) 200 MG tablet Take 100 mg by mouth 2 (two) times daily.   Yes Historical Provider, MD  allopurinol (ZYLOPRIM) 300 MG tablet Take 150 mg by mouth every Monday, Wednesday, and Friday.    Historical Provider, MD  enoxaparin (LOVENOX) 30 MG/0.3ML injection Inject 0.3 mLs (30 mg total) into the skin daily. Take Lovenox injections for eight more days, then discontinue Lovenox. Once the patient has completed the blood thinner regimen of Lovenox, then take a Baby 81 mg Aspirin daily for four more weeks. 06/11/13   Kylle Lall, PA-C  ferrous sulfate dried (SLOW FE) 160 (50 FE) MG TBCR Take 1 tablet (160 mg total) by mouth 3 (three) times daily with meals. Take three times a day for three weeks and then resume daily dosing. 06/11/13   Jadarious Dobbins Dara Lords, PA-C  hydrochlorothiazide (HYDRODIURIL) 25 MG tablet Take 25 mg by mouth every morning.    Historical Provider, MD  methocarbamol (ROBAXIN) 500 MG tablet Take 1 tablet (500 mg total) by mouth every 6 (six) hours as needed. 06/11/13   Saroya Riccobono, PA-C  oxyCODONE (OXY IR/ROXICODONE) 5 MG immediate release tablet Take 1-2 tablets (5-10 mg total) by mouth every 3 (three) hours as needed. 06/11/13   Ellyson Rarick, PA-C  zinc gluconate 50 MG tablet Take 50 mg by mouth daily.    Historical Provider, MD    Diet: Cardiac diet, Diabetic diet and Renal  diet Activity:WBAT Follow-up:in 2 weeks Disposition - Home Discharged Condition: good   Discharge Orders   Future Orders Complete By Expires     Call MD / Call 911  As directed     Comments:      If you experience chest pain or shortness of breath, CALL 911 and be transported to the hospital emergency room.  If you develope a fever above 101 F, pus (white drainage) or increased  drainage or redness at the wound, or calf pain, call your surgeon's office.    Change dressing  As directed     Comments:      Change dressing daily with sterile 4 x 4 inch gauze dressing and apply TED hose. Do not submerge the incision under water.    Constipation Prevention  As directed     Comments:      Drink plenty of fluids.  Prune juice may be helpful.  You may use a stool softener, such as Colace (over the counter) 100 mg twice a day.  Use MiraLax (over the counter) for constipation as needed.    Diet - low sodium heart healthy  As directed     Discharge instructions  As directed     Comments:      Pick up stool softner and laxative for home. Do not submerge incision under water. May shower. Continue to use ice for pain and swelling from surgery.  Take Lovenox for eight more days, then discontinue Lovenox. Once the patient has completed the blood thinner regimen of Lovenox, then take a Baby 81 mg Aspirin daily for four more weeks.    Do not put a pillow under the knee. Place it under the heel.  As directed     Do not sit on low chairs, stoools or toilet seats, as it may be difficult to get up from low surfaces  As directed     Driving restrictions  As directed     Comments:      No driving until released by the physician.    Increase activity slowly as tolerated  As directed     Lifting restrictions  As directed     Comments:      No lifting until released by the physician.    Patient may shower  As directed     Comments:      You may shower without a dressing once there is no drainage.  Do not  wash over the wound.  If drainage remains, do not shower until drainage stops.    TED hose  As directed     Comments:      Use stockings (TED hose) for 3 weeks on both leg(s).  You may remove them at night for sleeping.    Weight bearing as tolerated  As directed         Medication List    STOP taking these medications       cholecalciferol 1000 UNITS tablet  Commonly known as:  VITAMIN D     Colesevelam HCl 3.75 G Pack     fish oil-omega-3 fatty acids 1000 MG capsule     Flax Seed Oil 1000 MG Caps     HYDROcodone-acetaminophen 5-325 MG per tablet  Commonly known as:  NORCO/VICODIN     multivitamin with minerals Tabs     OVER THE COUNTER MEDICATION      TAKE these medications       allopurinol 300 MG tablet  Commonly known as:  ZYLOPRIM  Take 150 mg by mouth every Monday, Wednesday, and Friday.     enoxaparin 30 MG/0.3ML injection  Commonly known as:  LOVENOX  - Inject 0.3 mLs (30 mg total) into the skin daily. Take Lovenox injections for eight more days, then discontinue Lovenox.  - Once the patient has completed the blood thinner regimen of Lovenox, then take a Baby 81 mg Aspirin daily for four more weeks.     ferrous  sulfate dried 160 (50 FE) MG Tbcr  Commonly known as:  SLOW FE  Take 1 tablet (160 mg total) by mouth 3 (three) times daily with meals. Take three times a day for three weeks and then resume daily dosing.     hydrochlorothiazide 25 MG tablet  Commonly known as:  HYDRODIURIL  Take 25 mg by mouth every morning.     labetalol 200 MG tablet  Commonly known as:  NORMODYNE  Take 100 mg by mouth 2 (two) times daily.     methocarbamol 500 MG tablet  Commonly known as:  ROBAXIN  Take 1 tablet (500 mg total) by mouth every 6 (six) hours as needed.     oxyCODONE 5 MG immediate release tablet  Commonly known as:  Oxy IR/ROXICODONE  Take 1-2 tablets (5-10 mg total) by mouth every 3 (three) hours as needed.     zinc gluconate 50 MG tablet  Take 50 mg by  mouth daily.           Follow-up Information   Follow up with Gearlean Alf, MD. Schedule an appointment as soon as possible for a visit on 06/24/2013. (Call [phone] tomorrow to make the appointment)    Contact information:   [address] [place] [address] [place] [address] [id number]       Signed: Mickel Crow 06/11/2013, 10:13 AM

## 2013-06-11 NOTE — Progress Notes (Signed)
Pt to d/c home with [facility] home [facility]. No DME needs. Lovenox teaching kit given and explained. AVS reviewed and "My Chart" discussed with pt. Pt capable of verbalizing medications and follow-up appointments. Remains hemodynamically stable. No signs and symptoms of distress. Educated pt to return to ER in the case of SOB, dizziness, or chest pain.

## 2013-06-11 NOTE — Care Management Note (Signed)
    Page 1 of 1   06/11/2013     11:34:37 AM   CARE MANAGEMENT NOTE 06/11/2013  Patient:  Eileen Perez,Eileen Perez   Account Number:  [id number]  Date Initiated:  06/11/2013  Documentation initiated by:  Dessa Phi  Subjective/Objective Assessment:   ADMITTED W/OA L KNEE.     Action/Plan:   FROM HOME.   Anticipated DC Date:  06/11/2013   Anticipated DC Plan:  [place]  CM consult      Choice offered to / List presented to:  C-1 Patient        [place] arranged  HH-1 RN  [place]      [facility] agency  [facility]   Status of service:  Completed, signed off Medicare Important Message given?   (If response is "NO", the following Medicare IM given date fields will be blank) Date Medicare IM given:   Date Additional Medicare IM given:    Discharge Disposition:  [place]  Per UR Regulation:  Reviewed for med. necessity/level of care/duration of stay  If discussed at Long Length of Stay Meetings, dates discussed:    Comments:  06/12/13 Nili Honda RN,BSN NCM [place] DR. ALUISIO)HHPT. DEBIE GENTIVA REP AWARE OF D/C HHRNPT ORDER,& D/C TODAY.HAS ALL DME PTA.

## 2013-06-11 NOTE — Progress Notes (Signed)
   Subjective: 2 Days Post-Op Procedure(s) (LRB): RIGHT TOTAL KNEE ARTHROPLASTY (Right) Patient reports pain as mild.   Patient seen in rounds with Dr. Wynelle Link. Patient is well, and has had no acute complaints or problems Patient is ready to go home later today.  Objective: Vital signs in last 24 hours: Temp:  [98.6 F (37 C)-98.9 F (37.2 C)] 98.6 F (37 C) (07/23 0455) Pulse Rate:  [71-77] 71 (07/23 0932) Resp:  [15-16] 16 (07/23 0754) BP: (143-178)/(62-71) 155/71 mmHg (07/23 0932) SpO2:  [95 %-97 %] 97 % (07/23 0932)  Intake/Output from previous day:  Intake/Output Summary (Last 24 hours) at 06/11/13 1007 Last data filed at 06/11/13 0800  Gross per 24 hour  Intake   1170 ml  Output   1725 ml  Net   -555 ml    Intake/Output this shift: Total I/O In: 280 [P.O.:240; I.V.:40] Out: 300 [Urine:300]  Labs:  Recent Labs  06/10/13 0434 06/11/13 0443  HGB 8.9* 7.8*    Recent Labs  06/10/13 0434 06/11/13 0443  WBC 7.8 6.5  RBC 3.02* 2.67*  HCT 27.7* 23.8*  PLT 146* 124*    Recent Labs  06/10/13 0434 06/11/13 0443  NA 135 133*  K 4.8 4.4  CL 104 102  CO2 24 23  BUN 50* 49*  CREATININE 2.56* 2.47*  GLUCOSE 211* 160*  CALCIUM 9.6 9.9   No results found for this basename: LABPT, INR,  in the last 72 hours  EXAM: General - Patient is Alert, Appropriate and Oriented Extremity - Neurovascular intact Sensation intact distally Dorsiflexion/Plantar flexion intact No cellulitis present Incision - clean, dry, no drainage, healing Motor Function - intact, moving foot and toes well on exam.   Assessment/Plan: 2 Days Post-Op Procedure(s) (LRB): RIGHT TOTAL KNEE ARTHROPLASTY (Right) Procedure(s) (LRB): RIGHT TOTAL KNEE ARTHROPLASTY (Right) Past Medical History  Diagnosis Date  . Diabetes mellitus   . Hypertension   . Arthritis   . Anemia   . History of blood transfusion   . Chronic kidney disease    Principal Problem:   OA (osteoarthritis) of  knee Active Problems:   Postoperative anemia due to acute blood loss   Chronic kidney disease  (CKD Stage IV based upon the GFR on Admission)   Hypertension  Estimated body mass index is 25.6 kg/(m^2) as calculated from the following:   Height as of this encounter: 5\' 2"  (1.575 m).   Weight as of this encounter: 63.504 kg (140 lb). Up with therapy Discharge home with home health Diet - Cardiac diet, Diabetic diet and Renal diet Follow up - in 2 weeks Activity - WBAT Disposition - Home Condition Upon Discharge - Stable D/C Meds - See DC Summary DVT Prophylaxis - Lovenox for 10 days and the Aspirin daily for four more weeks.  PERKINS, ALEXZANDREW 06/11/2013, 10:07 AM

## 2013-06-11 NOTE — Progress Notes (Signed)
Physical Therapy Treatment Patient Details Name: Eileen Perez MRN: [id number] DOB: [birth year] Today's Date: 06/11/2013 Time: [id number] PT Time Calculation (min): 31 min  PT Assessment / Plan / Recommendation  History of Present Illness 72 yo female s/p R TKA. Hx of DM, OA, HTN, anemia.    Clinical Impression    PT Comments   Pt progressing very well with mobility. Practiced/reviewed exercises, ambulation. All education completed. Ready to d/c home from PT standpoint.   Follow Up Recommendations  Home health PT     Does the patient have the potential to tolerate intense rehabilitation     Barriers to Discharge        Equipment Recommendations  None recommended by PT    Recommendations for Other Services OT consult  Frequency 7X/week   Progress towards PT Goals Progress towards PT goals: Progressing toward goals  Plan Current plan remains appropriate    Precautions / Restrictions Precautions Precautions: Knee Required Braces or Orthoses: Knee Immobilizer - Right Knee Immobilizer - Right: Discontinue once straight leg raise with < 10 degree lag (KI dc'd 7/23-able to SLR) Restrictions Weight Bearing Restrictions: No RLE Weight Bearing: Weight bearing as tolerated   Pertinent Vitals/Pain 4/10 R knee with activity    Mobility  Bed Mobility Bed Mobility: Supine to Sit Supine to Sit: 4: Min assist Details for Bed Mobility Assistance: Assist for R LE.  Transfers Transfers: Sit to Stand;Stand to Sit Sit to Stand: 5: Supervision;From bed;From toilet Stand to Sit: 5: Supervision;To toilet;To chair/3-in-1 Details for Transfer Assistance:  VCS safety, technique, hand placement.  Ambulation/Gait Ambulation/Gait Assistance: 5: Supervision Ambulation Distance (Feet): 350 Feet Assistive device: Rolling walker Gait Pattern: Step-through pattern Stairs: No    Exercises Total Joint Exercises Ankle Circles/Pumps: AROM;Both;15 reps;Supine Quad Sets: AROM;Both;15  reps;Supine Short Arc Quad: AROM;Right;15 reps;Supine Heel Slides: AAROM;Right;15 reps;Supine Hip ABduction/ADduction: AROM;Right;15 reps;Supine Straight Leg Raises: AROM;Right;15 reps;Supine Goniometric ROM: R knee supine: 7-45 degrees   PT Diagnosis:    PT Problem List:   PT Treatment Interventions:     PT Goals (current goals can now be found in the care plan section)    Visit Information  Last PT Received On: 06/11/13 Assistance Needed: +1 History of Present Illness: 72 yo female s/p R TKA. Hx of DM, OA, HTN, anemia.     Subjective Data      Cognition  Cognition Arousal/Alertness: Awake/alert Behavior During Therapy: WFL for tasks assessed/performed Overall Cognitive Status: Within Functional Limits for tasks assessed    Balance     End of Session PT - End of Session Activity Tolerance: Patient tolerated treatment well Patient left: in chair;with call bell/phone within reach CPM Right Knee CPM Right Knee: Off   GP     Weston Anna, MPT Pager: [phone]

## 2013-07-22 ENCOUNTER — Other Ambulatory Visit (HOSPITAL_COMMUNITY): Payer: Self-pay | Admitting: *Deleted

## 2013-07-22 ENCOUNTER — Encounter: Payer: Self-pay | Admitting: Cardiology

## 2013-07-23 ENCOUNTER — Encounter (HOSPITAL_COMMUNITY)
Admission: RE | Admit: 2013-07-23 | Discharge: 2013-07-23 | Disposition: A | Discharge status: Home / Self Care | Payer: Medicare Other | Source: Ambulatory Visit | Attending: Nephrology | Admitting: Nephrology

## 2013-07-23 DIAGNOSIS — D631 Anemia in chronic kidney disease: Secondary | ICD-10-CM | POA: Insufficient documentation

## 2013-07-23 DIAGNOSIS — N189 Chronic kidney disease, unspecified: Secondary | ICD-10-CM | POA: Insufficient documentation

## 2013-07-23 DIAGNOSIS — I129 Hypertensive chronic kidney disease with stage 1 through stage 4 chronic kidney disease, or unspecified chronic kidney disease: Secondary | ICD-10-CM | POA: Insufficient documentation

## 2013-07-23 MED ORDER — EPOETIN ALFA 20000 UNIT/ML IJ SOLN
INTRAMUSCULAR | Status: AC
  Administered 2013-07-23: 11:00:00 20000 [IU] via SUBCUTANEOUS
  Filled 2013-07-23: qty 1

## 2013-07-23 MED ORDER — EPOETIN ALFA 20000 UNIT/ML IJ SOLN
20000.0000 [IU] | INTRAMUSCULAR | Status: DC
  Administered 2013-07-23: 20000 [IU] via SUBCUTANEOUS

## 2013-07-23 MED ORDER — SODIUM CHLORIDE 0.9 % IV SOLN
1020.0000 mg | Freq: Once | INTRAVENOUS | Status: AC
  Administered 2013-07-23: 11:00:00 1020 mg via INTRAVENOUS
  Filled 2013-07-23: qty 34

## 2013-07-29 DIAGNOSIS — D638 Anemia in other chronic diseases classified elsewhere: Secondary | ICD-10-CM | POA: Insufficient documentation

## 2013-07-29 DIAGNOSIS — K210 Gastro-esophageal reflux disease with esophagitis: Secondary | ICD-10-CM

## 2013-07-29 DIAGNOSIS — M109 Gout, unspecified: Secondary | ICD-10-CM | POA: Insufficient documentation

## 2013-07-29 DIAGNOSIS — M8580 Other specified disorders of bone density and structure, unspecified site: Secondary | ICD-10-CM | POA: Insufficient documentation

## 2013-07-29 DIAGNOSIS — K862 Cyst of pancreas: Secondary | ICD-10-CM | POA: Insufficient documentation

## 2013-07-31 ENCOUNTER — Encounter: Payer: Self-pay | Admitting: Cardiology

## 2013-07-31 ENCOUNTER — Ambulatory Visit (INDEPENDENT_AMBULATORY_CARE_PROVIDER_SITE_OTHER): Payer: Medicare Other | Admitting: Cardiology

## 2013-07-31 VITALS — BP 140/80 | HR 79 | Wt 136.0 lb

## 2013-07-31 DIAGNOSIS — R0989 Other specified symptoms and signs involving the circulatory and respiratory systems: Secondary | ICD-10-CM

## 2013-07-31 DIAGNOSIS — I10 Essential (primary) hypertension: Secondary | ICD-10-CM

## 2013-07-31 DIAGNOSIS — N189 Chronic kidney disease, unspecified: Secondary | ICD-10-CM

## 2013-07-31 NOTE — Assessment & Plan Note (Signed)
I do not appreciate a right carotid bruit on examination. I also do not appreciate a murmur. She is not having cardiac symptoms. Electrocardiogram is normal. I do not think further workup is indicated at this time.

## 2013-07-31 NOTE — Patient Instructions (Addendum)
**Note De-identified  Obfuscation** Your physician recommends that you continue on your current medications as directed. Please refer to the Current Medication list given to you today.  Your physician recommends that you schedule a follow-up appointment in: as needed  

## 2013-07-31 NOTE — Assessment & Plan Note (Signed)
Followed by nephrology. 

## 2013-07-31 NOTE — Assessment & Plan Note (Signed)
Blood pressure controlled. Continue present medications. 

## 2013-07-31 NOTE — Progress Notes (Signed)
HPI: 72 year old female for evaluation of ? murmur and ? Right carotid bruit noted on recent physical exam. Note renal Dopplers in March of 2013 showed no renal artery stenosis. Patient denies dyspnea on exertion, orthopnea, PND, pedal edema, palpitations, syncope or chest pain.  Current Outpatient Prescriptions  Medication Sig Dispense Refill  . allopurinol (ZYLOPRIM) 300 MG tablet Take 150 mg by mouth every Monday, Wednesday, and Friday.      Marland Kitchen aspirin 81 MG tablet 1/2 tab po qd      . ferrous sulfate dried (SLOW FE) 160 (50 FE) MG TBCR Take 1 tablet (160 mg total) by mouth 3 (three) times daily with meals. Take three times a day for three weeks and then resume daily dosing.  30 tablet  3  . Flaxseed, Linseed, (FLAXSEED OIL PO) Take 1 tablet by mouth daily.      . hydrochlorothiazide (HYDRODIURIL) 25 MG tablet Take 25 mg by mouth every morning.      . labetalol (NORMODYNE) 200 MG tablet Take 100 mg by mouth 2 (two) times daily.      . NON FORMULARY Iron Injection      . Red Yeast Rice Extract (RED YEAST RICE PO) Take 1 tablet by mouth daily.      Marland Kitchen zinc gluconate 50 MG tablet Take 50 mg by mouth daily.      . [DISCONTINUED] diltiazem (CARDIZEM) 120 MG tablet Take 120 mg by mouth 2 (two) times daily.       No current facility-administered medications for this visit.    No Known Allergies  Past Medical History  Diagnosis Date  . Diabetes mellitus   . Hypertension   . Arthritis     Osteoarthritis  . Anemia   . History of blood transfusion   . Chronic kidney disease     Chronic renal insufficiency  . Pancreatic cyst 1999  . Thyroid disease     Hyperparathyroid   . GERD (gastroesophageal reflux disease)   . Gout   . Osteopenia   . Diverticulosis   . Hiatal hernia   . Hyperlipidemia     Past Surgical History  Procedure Laterality Date  . Pancreatic cyst excision  1999  . Knee surgery    . Joint replacement  2012    left knee  . Eye surgery Left     cataract extraction  with IOL  . Cholecystectomy    . 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 Colonoscopy  03/21/12    Next one in 2018    History   Social History  . Marital Status: Divorced    Spouse Name: N/A    Number of Children: 3  . Years of Education: N/A   Occupational History  . Not on file.   Social History Main Topics  . Smoking status: Never Smoker   . Smokeless tobacco: Never Used  . Alcohol Use: No  . Drug Use: No  . Sexual Activity: Not on file   Other Topics Concern  . Not on file   Social History Narrative  . No narrative on file    Family History  Problem Relation Age of Onset  . Hypertension Mother   . Cancer Mother     Colon with METS  . Diabetes Mother   . Cancer Father     Prostate and Throat  ROS: no fevers or chills, productive cough, hemoptysis, dysphasia, odynophagia, melena, hematochezia, dysuria, hematuria, rash, seizure activity, orthopnea, PND, pedal edema, claudication. Remaining systems are negative.  Physical Exam:   Blood pressure 140/80, pulse 79, weight 136 lb (61.689 kg).  General:  Well developed/well nourished in NAD Skin warm/dry Patient not depressed No peripheral clubbing Back-normal HEENT-normal/normal eyelids Neck supple/normal carotid upstroke bilaterally; no bruits; no JVD; no thyromegaly chest - CTA/ normal expansion CV - RRR/normal S1 and S2; no murmurs, rubs or gallops;  PMI nondisplaced Abdomen -NT/ND, no HSM, no mass, + bowel sounds, no bruit 2+ femoral pulses, no bruits Ext-no edema, chords, 2+ DP Neuro-grossly nonfocal  ECG sinus rhythm with no ST changes.

## 2013-08-05 ENCOUNTER — Other Ambulatory Visit (HOSPITAL_COMMUNITY): Payer: Self-pay | Admitting: *Deleted

## 2013-08-06 ENCOUNTER — Encounter (HOSPITAL_COMMUNITY)
Admission: RE | Admit: 2013-08-06 | Discharge: 2013-08-06 | Disposition: A | Discharge status: Home / Self Care | Payer: Medicare Other | Source: Ambulatory Visit | Attending: Nephrology | Admitting: Nephrology

## 2013-08-06 MED ORDER — EPOETIN ALFA 20000 UNIT/ML IJ SOLN
INTRAMUSCULAR | Status: AC
  Filled 2013-08-06: qty 1

## 2013-08-06 MED ORDER — EPOETIN ALFA 20000 UNIT/ML IJ SOLN
20000.0000 [IU] | INTRAMUSCULAR | Status: DC
  Administered 2013-08-06: 09:00:00 20000 [IU] via SUBCUTANEOUS

## 2013-08-20 ENCOUNTER — Encounter (HOSPITAL_COMMUNITY)
Admission: RE | Admit: 2013-08-20 | Discharge: 2013-08-20 | Disposition: A | Discharge status: Home / Self Care | Payer: Medicare Other | Source: Ambulatory Visit | Attending: Nephrology | Admitting: Nephrology

## 2013-08-20 DIAGNOSIS — D509 Iron deficiency anemia, unspecified: Secondary | ICD-10-CM | POA: Insufficient documentation

## 2013-08-20 LAB — POCT HEMOGLOBIN-HEMACUE: Hemoglobin: 12.2 g/dL (ref 12.0–15.0)

## 2013-08-20 LAB — RENAL FUNCTION PANEL
Albumin: 3.7 g/dL (ref 3.5–5.2)
CO2: 24 mEq/L (ref 19–32)
Calcium: 10.4 mg/dL (ref 8.4–10.5)
Creatinine, Ser: 2.4 mg/dL — ABNORMAL HIGH (ref 0.50–1.10)
GFR calc Af Amer: 22 mL/min — ABNORMAL LOW (ref >90)
GFR calc non Af Amer: 19 mL/min — ABNORMAL LOW (ref >90)
Phosphorus: 3.4 mg/dL (ref 2.3–4.6)
Sodium: 138 mEq/L (ref 135–145)

## 2013-08-20 LAB — IRON AND TIBC
Iron: 67 ug/dL (ref 42–135)
Saturation Ratios: 32 % (ref 20–55)
TIBC: 207 ug/dL — ABNORMAL LOW (ref 250–470)
UIBC: 140 ug/dL (ref 125–400)

## 2013-08-20 LAB — FERRITIN: Ferritin: 1027 ng/mL — ABNORMAL HIGH (ref 10–291)

## 2013-08-20 MED ORDER — EPOETIN ALFA 20000 UNIT/ML IJ SOLN: 20000.0000 [IU] | INTRAMUSCULAR | Status: DC

## 2013-09-05 ENCOUNTER — Encounter (HOSPITAL_COMMUNITY)
Admission: RE | Admit: 2013-09-05 | Discharge: 2013-09-05 | Disposition: A | Discharge status: Home / Self Care | Payer: Medicare Other | Source: Ambulatory Visit | Attending: Nephrology | Admitting: Nephrology

## 2013-09-05 MED ORDER — EPOETIN ALFA 20000 UNIT/ML IJ SOLN: 20000.0000 [IU] | INTRAMUSCULAR | Status: DC

## 2013-09-14 ENCOUNTER — Encounter: Payer: Self-pay | Admitting: Internal Medicine

## 2013-09-15 LAB — HM PAP SMEAR: HM Pap smear: 2011

## 2013-09-15 LAB — HM COLONOSCOPY

## 2013-09-15 LAB — HM MAMMOGRAPHY: HM Mammogram: NORMAL

## 2013-09-15 LAB — HM DEXA SCAN: HM Dexa Scan: 2013

## 2013-09-19 ENCOUNTER — Encounter (HOSPITAL_COMMUNITY): Payer: Medicare Other

## 2013-09-25 ENCOUNTER — Encounter: Payer: Self-pay | Admitting: Internal Medicine

## 2013-09-25 ENCOUNTER — Other Ambulatory Visit (HOSPITAL_COMMUNITY): Payer: Self-pay | Admitting: *Deleted

## 2013-09-25 ENCOUNTER — Ambulatory Visit: Payer: Medicare Other | Admitting: Internal Medicine

## 2013-09-25 VITALS — BP 150/80 | HR 60 | Ht 62.0 in | Wt 134.0 lb

## 2013-09-25 DIAGNOSIS — E782 Mixed hyperlipidemia: Secondary | ICD-10-CM

## 2013-09-25 DIAGNOSIS — Z23 Encounter for immunization: Secondary | ICD-10-CM

## 2013-09-25 DIAGNOSIS — R7309 Other abnormal glucose: Secondary | ICD-10-CM

## 2013-09-25 DIAGNOSIS — E559 Vitamin D deficiency, unspecified: Secondary | ICD-10-CM

## 2013-09-25 DIAGNOSIS — Z79899 Other long term (current) drug therapy: Secondary | ICD-10-CM

## 2013-09-25 DIAGNOSIS — I10 Essential (primary) hypertension: Secondary | ICD-10-CM

## 2013-09-25 LAB — CBC WITH DIFFERENTIAL/PLATELET
Basophils Absolute: 0 10*3/uL (ref 0.0–0.1)
Eosinophils Absolute: 0.2 10*3/uL (ref 0.0–0.7)
Eosinophils Relative: 4 % (ref 0–5)
MCH: 28.2 pg (ref 26.0–34.0)
MCV: 86 fL (ref 78.0–100.0)
Monocytes Relative: 5 % (ref 3–12)
Platelets: 165 10*3/uL (ref 150–400)
RDW: 15 % (ref 11.5–15.5)
WBC: 4.8 10*3/uL (ref 4.0–10.5)

## 2013-09-25 LAB — TSH: TSH: 1.989 u[IU]/mL (ref 0.350–4.500)

## 2013-09-25 LAB — HEPATIC FUNCTION PANEL
ALT: 13 U/L (ref 0–35)
AST: 17 U/L (ref 0–37)
Alkaline Phosphatase: 201 U/L — ABNORMAL HIGH (ref 39–117)
Bilirubin, Direct: 0.1 mg/dL (ref 0.0–0.3)
Indirect Bilirubin: 0.4 mg/dL (ref 0.0–0.9)

## 2013-09-25 LAB — BASIC METABOLIC PANEL WITH GFR
BUN: 56 mg/dL — ABNORMAL HIGH (ref 6–23)
Chloride: 107 mEq/L (ref 96–112)
Creat: 2.65 mg/dL — ABNORMAL HIGH (ref 0.50–1.10)
GFR, Est African American: 20 mL/min — ABNORMAL LOW
GFR, Est Non African American: 17 mL/min — ABNORMAL LOW
Sodium: 141 mEq/L (ref 135–145)

## 2013-09-25 LAB — LIPID PANEL
Cholesterol: 215 mg/dL — ABNORMAL HIGH (ref 0–200)
Total CHOL/HDL Ratio: 3.5 Ratio
Triglycerides: 78 mg/dL (ref <150)

## 2013-09-25 LAB — MAGNESIUM: Magnesium: 2 mg/dL (ref 1.5–2.5)

## 2013-09-25 MED ORDER — HYDROCHLOROTHIAZIDE 25 MG PO TABS: 25.0000 mg | ORAL_TABLET | Freq: Every morning | ORAL | Status: DC

## 2013-09-25 NOTE — Progress Notes (Signed)
Patient ID: Eileen Perez, female   DOB: [birth year], 72 y.o.   MRN: [id number]  HPI: Patient presents for 3 month follow up with hypertension, hyperlipidemia, Chronic Renal Insufficiency, pre-diabetes and vitamin D deficiency.  Patient had recent Rt. TKR in July by Dr Marcina Millard to be recovering well.  BP not been monitored at home, today's BP150/80 is not at goal. Patient denies chest pain, palpitations, dyspnea/orthopnea/PND, dizziness, claudication, or edema. Dyslipidemia is controlled with diet & is on Red Yeast Rice Extract. Denies myalgias. His/her last cholesterol was 223, Trig 1and LDL128. She admately desires to avoid taking any other meds for cholesterol and admits forgetting her Welchol about twice weekly.   Also; patient has prediabetes/insulin resistance with last A1c of 5.4 % (had previously been 6.6%).  No c/o's of diabetic poly's, paresthesias or visual blurring.    Hx/o vitamin D deficiency with pt currently not on supplements.   Current outpatient prescriptions:allopurinol (ZYLOPRIM) 300 MG tablet, Take 150 mg by mouth every Monday, Wednesday, and Friday., Disp: , Rfl: ;  aspirin 81 MG tablet, 1/2 tab po qd, Disp: , Rfl: ;  Colesevelam HCl (WELCHOL) 3.75 G PACK, Take 3.75 each by mouth 4 (four) times a week., Disp: , Rfl:  ferrous sulfate dried (SLOW FE) 160 (50 FE) MG TBCR, Take 1 tablet (160 mg total) by mouth 2  times daily with meals. Take three times a day for three weeks and then resume daily dosing., Disp: 30 tablet, Rfl: 3;   Flaxseed, Linseed, (FLAXSEED OIL PO), Take 1 tablet by mouth daily., Disp: , Rfl: ;    hydrochlorothiazide (HYDRODIURIL) 25 MG tablet, Take 1 tablet (25 mg total) by mouth every morning. for BP and fluid, Disp: 90 tablet, Rfl: 99  labetalol (NORMODYNE) 200 MG tablet, Take 100 mg by mouth 2 (two) times daily., Disp: , Rfl: ;    oxyCODONE (OXY IR/ROXICODONE) 5 MG immediate release tablet, Take 5 mg by mouth every 4 (four) hours as needed for pain  (Alusio), Disp: , Rfl: ;    Red Yeast Rice Extract (RED YEAST RICE PO), Take 1 tablet by mouth daily., Disp: , Rfl: ;    zinc gluconate 50 MG tablet, Take 50 mg by mouth daily., Disp: , Rfl: ;  NON FORMULARY, Iron Injection, Disp: , Rfl:   Welchol 3.75 gm 5 x/wk  Allergies  Ace inhibitors and NsaidsSystems Review:   Constitutional: Denies fever, chills, wt changes, headaches, insomnia, fatigue, night sweats, change in appetite. Eyes: Denies redness, blurred vision, diplopia, discharge, itchy, watery eyes.  ENT: Denies discharge, congestion, post nasal drip, epistaxis, sore throat, earache, hearing loss, dental pain, Tinnitus, Vertigo, Sinus pain, snoring.  CV: Denies chest pain, palpitations, irregular heartbeat, syncope, dyspnea, diaphoresis, orthopnea, PND, claudication, edema Respiratory: denies cough, dyspnea, DOE, pleurisy, hoarseness, laryngitis, wheezing.  Gastrointestinal: Denies dysphagia, oodynophagia, heartburn, reflux, waterbrash, pain, cramps, nausea, vomiting, bloating, diarrhea, constipation Genitourinary: Denies dysuria, frequency, urgency, nocturia, hesistancy, discharge, hematuria, Musculoskeletal: Denies arthralgias, myalgias, stiffness, Jt. Swelling, pain, limp, strain/sprain.  Skin: Denies pruritus, rash, hives, warts, acne, eczema, change in in skin lesion(s) Neuro: No weakness, tremor, incoordination, spasms, paresthesia, pain Endo: Denies change in weight, skin, hair change, nocturia, diabetic polys, paresthesias, visual blurring, hyper/hypo-glycemic episodes.  Heme/Lymph: Excessive bleeding, bruising PMHx: FHx: Reviewed / unchanged SHx: Reviewed / unchanged Filed Vitals:   09/25/13 0921  BP: 150/80  Pulse: 60  Body mass index is 24.5 kg/(m^2). On Exam: Appears well nourished - in NAD. Eyes: PERRLA, EOMs, conjunctiva no swelling or  erythema, normal fundi and vessels. Sinuses: No Frontal/maxillary tenderness ENT/Mouth: EACs clear, TMs nl w/o erythema,  bulging. Nares clear w/o erythema, swelling, exudates. OroPharynx clear. No ulcers, cracking, on lips. Dentition- unremarkable. No erythema. Tongue normal, non obstructing. Tonsils not swollen or erythematous. Hearing intact.  Neck: Supple. Car 2+/2+. Thyroid nl. No LN, bruits or JVD. Chest: Respirations nl, BS equal w/o  rales, rhonci, wheezing or stridor.  Cardio: Heart sounds normal, regular rate and rhythm without sig. murmurs, gallops,clicks, or rubs. Peripheral pulses brisk and equal bilaterally, without edema.  Abdomen: Flat, soft, with bowl sounds. Non-tender,  no guarding, rebound, hernias, masses, or organomegaly.   Musculoskeletal: Full ROM all peripheral extremities, joint stability, 5/5 strength, and slight limp favoring rt. Lower ext.  Skin: Warm, dry without rashes, lesions, ecchymosis.  Neuro: Cranial nerves intact, reflexes equal bilaterally. Sensory-motor testing grossly intact. Tendon reflexes grossly intact.  Pysch: Alert & oriented x 3. Insight and judgement nl & appropriate. No ideations  Assessment and Plan: 1. Hypertension: Continue medication, monitor blood pressure at home. Continue diet/meds. 2. Cholesterol: Continue diet/meds, exercise,& lifestyle modifications. Continue monitor cholesterol/LFTs.  3. Pre-diabetes/Diabesity- Continue diet, exercise, lifestyle modifications. Monitor appropriate labs. 4. Vitamin D Deficiency - recc vit D 4,000 u/da

## 2013-09-25 NOTE — Patient Instructions (Signed)
Continue meds same and start vitamin D 4,000 units daily as discussed  Cholesterol Cholesterol is a white, waxy, fat-like protein needed by your body in small amounts. The liver makes all the cholesterol you need. It is carried from the liver by the blood through the blood vessels. Deposits (plaque) may build up on blood vessel walls. This makes the arteries narrower and stiffer. Plaque increases the risk for heart attack and stroke. You cannot feel your cholesterol level even if it is very high. The only way to know is by a blood test to check your lipid (fats) levels. Once you know your cholesterol levels, you should keep a record of the test results. Work with your caregiver to to keep your levels in the desired range. WHAT THE RESULTS MEAN:  Total cholesterol is a rough measure of all the cholesterol in your blood.  LDL is the so-called bad cholesterol. This is the type that deposits cholesterol in the walls of the arteries. You want this level to be low.  HDL is the good cholesterol because it cleans the arteries and carries the LDL away. You want this level to be high.  Triglycerides are fat that the body can either burn for energy or store. High levels are closely linked to heart disease. DESIRED LEVELS:  Total cholesterol below 200.  LDL below 100 for people at risk, below 70 for very high risk.  HDL above 50 is good, above 60 is best.  Triglycerides below 150. HOW TO LOWER YOUR CHOLESTEROL:  Diet.  Choose fish or white meat chicken and [place], roasted or baked. Limit fatty cuts of red meat, fried foods, and processed meats, such as sausage and lunch meat.  Eat lots of fresh fruits and vegetables. Choose whole grains, beans, pasta, potatoes and cereals.  Use only small amounts of olive, corn or canola oils. Avoid butter, mayonnaise, shortening or palm kernel oils. Avoid foods with trans-fats.  Use skim/nonfat milk and low-fat/nonfat yogurt and cheeses. Avoid whole milk,  cream, ice cream, egg yolks and cheeses. Healthy desserts include angel food cake, ginger snaps, animal crackers, hard candy, popsicles, and low-fat/nonfat frozen yogurt. Avoid pastries, cakes, pies and cookies.  Exercise.  A regular program helps decrease LDL and raises HDL.  Helps with weight control.  Do things that increase your activity level like gardening, walking, or taking the stairs.  Medication.  May be prescribed by your caregiver to help lowering cholesterol and the risk for heart disease.  You may need medicine even if your levels are normal if you have several risk factors. HOME CARE INSTRUCTIONS   Follow your diet and exercise programs as suggested by your caregiver.  Take medications as directed.  Have blood work done when your caregiver feels it is necessary. MAKE SURE YOU:   Understand these instructions.  Will watch your condition.  Will get help right away if you are not doing well or get worse. Document Released: 08/01/2001 Document Revised: 01/29/2012 Document Reviewed: 01/22/2008 [facility] Patient Information 2014 [place], [place]. Diabetes and Exercise Exercising regularly is important. It is not just about losing weight. It has many health benefits, such as:  Improving your overall fitness, flexibility, and endurance.  Increasing your bone density.  Helping with weight control.  Decreasing your body fat.  Increasing your muscle strength.  Reducing stress and tension.  Improving your overall health. People with diabetes who exercise gain additional benefits because exercise:  Reduces appetite.  Improves the body's use of blood sugar (glucose).  Helps  lower or control blood glucose.  Decreases blood pressure.  Helps control blood lipids (such as cholesterol and triglycerides).  Improves the body's use of the hormone insulin by:  Increasing the body's insulin sensitivity.  Reducing the body's insulin needs.  Decreases the risk for  heart disease because exercising:  Lowers cholesterol and triglycerides levels.  Increases the levels of good cholesterol (such as high-density lipoproteins [HDL]) in the body.  Lowers blood glucose levels. YOUR ACTIVITY PLAN  Choose an activity that you enjoy and set realistic goals. Your health care provider or diabetes educator can help you make an activity plan that works for you. You can break activities into 2 or 3 sessions throughout the day. Doing so is as good as one long session. Exercise ideas include:  Taking the dog for a walk.  Taking the stairs instead of the elevator.  Dancing to your favorite song.  Doing your favorite exercise with a friend. RECOMMENDATIONS FOR EXERCISING WITH TYPE 1 OR TYPE 2 DIABETES   Check your blood glucose before exercising. If blood glucose levels are greater than 240 mg/dL, check for urine ketones. Do not exercise if ketones are present.  Avoid injecting insulin into areas of the body that are going to be exercised. For example, avoid injecting insulin into:  The arms when playing tennis.  The legs when jogging.  Keep a record of:  Food intake before and after you exercise.  Expected peak times of insulin action.  Blood glucose levels before and after you exercise.  The type and amount of exercise you have done.  Review your records with your health care provider. Your health care provider will help you to develop guidelines for adjusting food intake and insulin amounts before and after exercising.  If you take insulin or oral hypoglycemic agents, watch for signs and symptoms of hypoglycemia. They include:  Dizziness.  Shaking.  Sweating.  Chills.  Confusion.  Drink plenty of water while you exercise to prevent dehydration or heat stroke. Body water is lost during exercise and must be replaced.  Talk to your health care provider before starting an exercise program to make sure it is safe for you. Remember, almost any type  of activity is better than none. Document Released: 01/27/2004 Document Revised: 07/09/2013 Document Reviewed: 04/15/2013 [facility] Patient Information 2014 [place]. Hypertension As your heart beats, it forces blood through your arteries. This force is your blood pressure. If the pressure is too high, it is called hypertension (HTN) or high blood pressure. HTN is dangerous because you may have it and not know it. High blood pressure may mean that your heart has to work harder to pump blood. Your arteries may be narrow or stiff. The extra work puts you at risk for heart disease, stroke, and other problems.  Blood pressure consists of two numbers, a higher number over a lower, 110/72, for example. It is stated as "110 over 72." The ideal is below 120 for the top number (systolic) and under 80 for the bottom (diastolic). Write down your blood pressure today. You should pay close attention to your blood pressure if you have certain conditions such as:  Heart failure.  Prior heart attack.  Diabetes  Chronic kidney disease.  Prior stroke.  Multiple risk factors for heart disease. To see if you have HTN, your blood pressure should be measured while you are seated with your arm held at the level of the heart. It should be measured at least twice. A one-time elevated  blood pressure reading (especially in the Emergency Department) does not mean that you need treatment. There may be conditions in which the blood pressure is different between your right and left arms. It is important to see your caregiver soon for a recheck. Most people have essential hypertension which means that there is not a specific cause. This type of high blood pressure may be lowered by changing lifestyle factors such as:  Stress.  Smoking.  Lack of exercise.  Excessive weight.  Drug/tobacco/alcohol use.  Eating less salt. Most people do not have symptoms from high blood pressure until it has caused damage to the  body. Effective treatment can often prevent, delay or reduce that damage. TREATMENT  When a cause has been identified, treatment for high blood pressure is directed at the cause. There are a large number of medications to treat HTN. These fall into several categories, and your caregiver will help you select the medicines that are best for you. Medications may have side effects. You should review side effects with your caregiver. If your blood pressure stays high after you have made lifestyle changes or started on medicines,   Your medication(s) may need to be changed.  Other problems may need to be addressed.  Be certain you understand your prescriptions, and know how and when to take your medicine.  Be sure to follow up with your caregiver within the time frame advised (usually within two weeks) to have your blood pressure rechecked and to review your medications.  If you are taking more than one medicine to lower your blood pressure, make sure you know how and at what times they should be taken. Taking two medicines at the same time can result in blood pressure that is too low. SEEK IMMEDIATE MEDICAL CARE IF:  You develop a severe headache, blurred or changing vision, or confusion.  You have unusual weakness or numbness, or a faint feeling.  You have severe chest or abdominal pain, vomiting, or breathing problems. MAKE SURE YOU:   Understand these instructions.  Will watch your condition.  Will get help right away if you are not doing well or get worse. Document Released: 11/06/2005 Document Revised: 01/29/2012 Document Reviewed: 06/26/2008 [facility] Patient Information 2014 [place].

## 2013-09-26 ENCOUNTER — Encounter (HOSPITAL_COMMUNITY): Payer: Medicare Other

## 2013-10-03 ENCOUNTER — Encounter (HOSPITAL_COMMUNITY)
Admission: RE | Admit: 2013-10-03 | Discharge: 2013-10-03 | Disposition: A | Discharge status: Home / Self Care | Payer: Medicare Other | Source: Ambulatory Visit | Attending: Nephrology | Admitting: Nephrology

## 2013-10-03 DIAGNOSIS — D509 Iron deficiency anemia, unspecified: Secondary | ICD-10-CM | POA: Insufficient documentation

## 2013-10-03 LAB — RENAL FUNCTION PANEL
Albumin: 3.7 g/dL (ref 3.5–5.2)
Calcium: 10.5 mg/dL (ref 8.4–10.5)
Chloride: 108 mEq/L (ref 96–112)
GFR calc Af Amer: 21 mL/min — ABNORMAL LOW (ref >90)
GFR calc non Af Amer: 18 mL/min — ABNORMAL LOW (ref >90)
Glucose, Bld: 119 mg/dL — ABNORMAL HIGH (ref 70–99)
Phosphorus: 3.6 mg/dL (ref 2.3–4.6)
Potassium: 4.3 mEq/L (ref 3.5–5.1)
Sodium: 140 mEq/L (ref 135–145)

## 2013-10-03 LAB — FERRITIN: Ferritin: 905 ng/mL — ABNORMAL HIGH (ref 10–291)

## 2013-10-03 LAB — POCT HEMOGLOBIN-HEMACUE: Hemoglobin: 12 g/dL (ref 12.0–15.0)

## 2013-10-03 LAB — IRON AND TIBC
Saturation Ratios: 39 % (ref 20–55)
TIBC: 234 ug/dL — ABNORMAL LOW (ref 250–470)

## 2013-10-03 MED ORDER — EPOETIN ALFA 20000 UNIT/ML IJ SOLN: 20000.0000 [IU] | INTRAMUSCULAR | Status: DC

## 2013-10-06 LAB — PTH, INTACT AND CALCIUM: Calcium, Total (PTH): 10.5 mg/dL (ref 8.4–10.5)

## 2013-10-31 ENCOUNTER — Encounter (HOSPITAL_COMMUNITY)
Admission: RE | Admit: 2013-10-31 | Discharge: 2013-10-31 | Disposition: A | Discharge status: Home / Self Care | Payer: Medicare Other | Source: Ambulatory Visit | Attending: Nephrology | Admitting: Nephrology

## 2013-10-31 DIAGNOSIS — D509 Iron deficiency anemia, unspecified: Secondary | ICD-10-CM | POA: Insufficient documentation

## 2013-10-31 LAB — RENAL FUNCTION PANEL
Albumin: 3.7 g/dL (ref 3.5–5.2)
BUN: 53 mg/dL — ABNORMAL HIGH (ref 6–23)
Calcium: 10.5 mg/dL (ref 8.4–10.5)
Creatinine, Ser: 2.42 mg/dL — ABNORMAL HIGH (ref 0.50–1.10)
GFR calc non Af Amer: 19 mL/min — ABNORMAL LOW (ref >90)
Glucose, Bld: 112 mg/dL — ABNORMAL HIGH (ref 70–99)
Phosphorus: 3.5 mg/dL (ref 2.3–4.6)

## 2013-10-31 LAB — IRON AND TIBC
Saturation Ratios: 42 % (ref 20–55)
TIBC: 233 ug/dL — ABNORMAL LOW (ref 250–470)
UIBC: 136 ug/dL (ref 125–400)

## 2013-10-31 MED ORDER — EPOETIN ALFA 20000 UNIT/ML IJ SOLN
INTRAMUSCULAR | Status: AC
  Administered 2013-10-31: 09:00:00 20000 [IU] via SUBCUTANEOUS
  Filled 2013-10-31: qty 1

## 2013-10-31 MED ORDER — EPOETIN ALFA 20000 UNIT/ML IJ SOLN: 20000.0000 [IU] | INTRAMUSCULAR | Status: DC

## 2013-11-03 LAB — PTH, INTACT AND CALCIUM: PTH: 312 pg/mL — ABNORMAL HIGH (ref 14.0–72.0)

## 2013-11-28 ENCOUNTER — Encounter (HOSPITAL_COMMUNITY)
Admission: RE | Admit: 2013-11-28 | Discharge: 2013-11-28 | Disposition: A | Discharge status: Home / Self Care | Payer: Medicare Other | Source: Ambulatory Visit | Attending: Nephrology | Admitting: Nephrology

## 2013-11-28 DIAGNOSIS — D638 Anemia in other chronic diseases classified elsewhere: Secondary | ICD-10-CM | POA: Insufficient documentation

## 2013-11-28 DIAGNOSIS — N3030 Trigonitis without hematuria: Secondary | ICD-10-CM | POA: Insufficient documentation

## 2013-11-28 LAB — IRON AND TIBC
Iron: 169 ug/dL — ABNORMAL HIGH (ref 42–135)
Saturation Ratios: 57 % — ABNORMAL HIGH (ref 20–55)
TIBC: 298 ug/dL (ref 250–470)
UIBC: 129 ug/dL (ref 125–400)

## 2013-11-28 LAB — RENAL FUNCTION PANEL
ALBUMIN: 3.6 g/dL (ref 3.5–5.2)
BUN: 68 mg/dL — AB (ref 6–23)
CHLORIDE: 109 meq/L (ref 96–112)
CO2: 18 mEq/L — ABNORMAL LOW (ref 19–32)
Calcium: 10.3 mg/dL (ref 8.4–10.5)
Creatinine, Ser: 2.59 mg/dL — ABNORMAL HIGH (ref 0.50–1.10)
GFR calc non Af Amer: 17 mL/min — ABNORMAL LOW (ref >90)
GFR, EST AFRICAN AMERICAN: 20 mL/min — AB (ref >90)
GLUCOSE: 106 mg/dL — AB (ref 70–99)
POTASSIUM: 4.4 meq/L (ref 3.7–5.3)
Phosphorus: 3.7 mg/dL (ref 2.3–4.6)
Sodium: 142 mEq/L (ref 137–147)

## 2013-11-28 LAB — FERRITIN: Ferritin: 653 ng/mL — ABNORMAL HIGH (ref 10–291)

## 2013-11-28 MED ORDER — EPOETIN ALFA 20000 UNIT/ML IJ SOLN: 20000.0000 [IU] | INTRAMUSCULAR | Status: DC

## 2013-11-28 MED ORDER — EPOETIN ALFA 20000 UNIT/ML IJ SOLN
INTRAMUSCULAR | Status: AC
  Administered 2013-11-28: 09:00:00 20000 [IU] via SUBCUTANEOUS
  Filled 2013-11-28: qty 1

## 2013-12-01 LAB — PTH, INTACT AND CALCIUM
Calcium, Total (PTH): 10.4 mg/dL (ref 8.4–10.5)
PTH: 234 pg/mL — AB (ref 14.0–72.0)

## 2013-12-01 LAB — POCT HEMOGLOBIN-HEMACUE: Hemoglobin: 11.4 g/dL — ABNORMAL LOW (ref 12.0–15.0)

## 2013-12-26 ENCOUNTER — Encounter (HOSPITAL_COMMUNITY): Payer: Medicare Other

## 2013-12-30 ENCOUNTER — Encounter: Payer: Self-pay | Admitting: Emergency Medicine

## 2013-12-30 ENCOUNTER — Ambulatory Visit (INDEPENDENT_AMBULATORY_CARE_PROVIDER_SITE_OTHER): Payer: Medicare Other | Admitting: Emergency Medicine

## 2013-12-30 VITALS — BP 112/70 | HR 80 | Temp 96.0°F | Resp 16 | Ht 62.0 in | Wt 129.8 lb

## 2013-12-30 DIAGNOSIS — N289 Disorder of kidney and ureter, unspecified: Secondary | ICD-10-CM

## 2013-12-30 DIAGNOSIS — E782 Mixed hyperlipidemia: Secondary | ICD-10-CM

## 2013-12-30 DIAGNOSIS — R7309 Other abnormal glucose: Secondary | ICD-10-CM

## 2013-12-30 DIAGNOSIS — I10 Essential (primary) hypertension: Secondary | ICD-10-CM

## 2013-12-30 LAB — CBC WITH DIFFERENTIAL/PLATELET
Basophils Absolute: 0 10*3/uL (ref 0.0–0.1)
Basophils Relative: 0 % (ref 0–1)
EOS ABS: 0.2 10*3/uL (ref 0.0–0.7)
EOS PCT: 4 % (ref 0–5)
HEMATOCRIT: 34.6 % — AB (ref 36.0–46.0)
HEMOGLOBIN: 10.9 g/dL — AB (ref 12.0–15.0)
LYMPHS ABS: 1.5 10*3/uL (ref 0.7–4.0)
LYMPHS PCT: 29 % (ref 12–46)
MCH: 29.2 pg (ref 26.0–34.0)
MCHC: 31.5 g/dL (ref 30.0–36.0)
MCV: 92.8 fL (ref 78.0–100.0)
MONO ABS: 0.4 10*3/uL (ref 0.1–1.0)
MONOS PCT: 7 % (ref 3–12)
Neutro Abs: 3 10*3/uL (ref 1.7–7.7)
Neutrophils Relative %: 60 % (ref 43–77)
Platelets: 197 10*3/uL (ref 150–400)
RBC: 3.73 MIL/uL — AB (ref 3.87–5.11)
RDW: 15.6 % — ABNORMAL HIGH (ref 11.5–15.5)
WBC: 5.1 10*3/uL (ref 4.0–10.5)

## 2013-12-30 LAB — HEMOGLOBIN A1C
Hgb A1c MFr Bld: 5.9 % — ABNORMAL HIGH (ref <5.7)
Mean Plasma Glucose: 123 mg/dL — ABNORMAL HIGH (ref <117)

## 2013-12-30 MED ORDER — LABETALOL HCL 200 MG PO TABS: 200.0000 mg | ORAL_TABLET | Freq: Two times a day (BID) | ORAL | Status: DC

## 2013-12-30 NOTE — Progress Notes (Signed)
Subjective:    Patient ID: Eileen Perez, female    DOB: August 11, 1941, 73 y.o.   MRN: [id number]  HPI Comments: 74 yo female presents for 3 month F/U for HTN, Cholesterol, Pre-Dm, D. Deficient. She increased Labetalol last month to 200 mg BID because BP was elevated. She notes BP has been better and she is feeling well overall. She is eating well overall. She is exercising 5 x week. She was unaware of last BUN/ Creat increase from lab at Hematology. She goes to get transfusions biweekly but will start monthly after last infusion. She denies any new kidney related symptoms. She has f/u with [facility] Cipro in 2 months. Last LABS CHOL         215   09/25/2013 HDL           62   09/25/2013 LDLCALC      137   09/25/2013 TRIG          78   09/25/2013 CHOLHDL      3.5   09/25/2013 ALT           13   09/25/2013 AST           17   09/25/2013 ALKPHOS      201   09/25/2013 BILITOT      0.5   09/25/2013 CREATININE     2.59   11/28/2013 BUN              68   11/28/2013 NA              142   11/28/2013 K               4.4   11/28/2013 CL              109   11/28/2013 CO2              18   11/28/2013 HGBA1C      6.4   09/25/2013     Hyperlipidemia     Medication List       This list is accurate as of: 12/30/13 11:59 PM.  Always use your most recent med list.               allopurinol 300 MG tablet  Commonly known as:  ZYLOPRIM  Take 150 mg by mouth every Monday, Wednesday, and Friday.     aspirin 81 MG tablet  1/2 tab po qd     ferrous sulfate dried 160 (50 FE) MG Tbcr SR tablet  Commonly known as:  SLOW FE  Take 45 mg by mouth 3 (three) times daily with meals. Take three times a day for three weeks and then resume daily dosing.     Fish Oil 1000 MG Caps  Take 1,000 mg by mouth.     FLAXSEED OIL PO  Take 1,000 mg by mouth daily.     HM VITAMIN D3 4000 UNITS Caps  Generic drug:  Cholecalciferol  Take 4,000 Int'l Units by mouth.     hydrochlorothiazide 25 MG tablet  Commonly known as:  HYDRODIURIL   Take 1 tablet (25 mg total) by mouth every morning. for BP and fluid     labetalol 200 MG tablet  Commonly known as:  NORMODYNE  Take 1 tablet (200 mg total) by mouth 2 (two) times daily.     NON FORMULARY  every 6 (six) weeks. Iron Injection     RED YEAST RICE PO  Take 1,200 tablets by  mouth daily.     WELCHOL 3.75 G Pack  Generic drug:  Colesevelam HCl  Take 3.75 each by mouth 4 (four) times a week.     zinc gluconate 50 MG tablet  Take 50 mg by mouth daily.       Allergies  Allergen Reactions  . Ace Inhibitors   . Nsaids    Past Medical History  Diagnosis Date  . Diabetes mellitus   . Hypertension   . Arthritis     Osteoarthritis  . Anemia   . History of blood transfusion   . Chronic kidney disease     Chronic renal insufficiency  . Pancreatic cyst 1999  . Thyroid disease     Hyperparathyroid   . GERD (gastroesophageal reflux disease)   . Gout   . Osteopenia   . Diverticulosis   . Hiatal hernia   . Hyperlipidemia       Review of Systems  All other systems reviewed and are negative.   BP 112/70  Pulse 80  Temp(Src) 96 F (35.6 C)  Resp 16  Ht 5\' 2"  (1.575 m)  Wt 129 lb 12.8 oz (58.877 kg)  BMI 23.73 kg/m2     Objective:   Physical Exam  Nursing note and vitals reviewed. Constitutional: She is oriented to person, place, and time. She appears well-developed and well-nourished. No distress.  HENT:  Head: Normocephalic and atraumatic.  Right Ear: External ear normal.  Left Ear: External ear normal.  Nose: Nose normal.  Mouth/Throat: Oropharynx is clear and moist.  Eyes: Conjunctivae and EOM are normal.  Neck: Normal range of motion. Neck supple. No JVD present. No thyromegaly present.  Cardiovascular: Normal rate, regular rhythm, normal heart sounds and intact distal pulses.   Pulmonary/Chest: Effort normal and breath sounds normal.  Abdominal: Soft. Bowel sounds are normal. She exhibits no distension and no mass. There is no tenderness.  There is no rebound and no guarding.  Musculoskeletal: Normal range of motion. She exhibits no edema and no tenderness.  Lymphadenopathy:    She has no cervical adenopathy.  Neurological: She is alert and oriented to person, place, and time. No cranial nerve deficit.  Skin: Skin is warm and dry. No rash noted. No erythema. No pallor.  Psychiatric: She has a normal mood and affect. Her behavior is normal. Judgment and thought content normal.          Assessment & Plan:  1.  3 month F/U for HTN, Cholesterol, Pre-Dm, D. Deficient. Needs healthy diet, cardio QD and obtain healthy weight. Check Labs, Check BP if >130/80 call office 2. Renal Insufficiency- Keep f/u Nephrology, w/c if SX increase or ER.

## 2013-12-30 NOTE — Patient Instructions (Signed)
Chronic Kidney Disease Chronic kidney disease occurs when the kidneys are damaged over a long period. The kidneys are two organs that lie on either side of the spine between the middle of the back and the front of the abdomen. The kidneys:   Remove wastes and extra water from the blood.   Produce important hormones. These help keep bones strong, regulate blood pressure, and help create red blood cells.   Balance the fluids and chemicals in the blood and tissues. A small amount of kidney damage may not cause problems, but a large amount of damage may make it difficult or impossible for the kidneys to work the way they should. If steps are not taken to slow down the kidney damage or stop it from getting worse, the kidneys may stop working permanently. Most of the time, chronic kidney disease does not go away. However, it can often be controlled, and those with the disease can usually live normal lives. CAUSES  The most common causes of chronic kidney disease are diabetes and high blood pressure (hypertension). Chronic kidney disease may also be caused by:   Diseases that cause kidneys' filters to become inflamed.   Diseases that affect the immune system.   Genetic diseases.   Medicines that damage the kidneys, such as anti-inflammatory medicines.  Poisoning or exposure to toxic substances.   A reoccurring kidney or urinary infection.   A problem with urine flow. This may be caused by:   Cancer.   Kidney stones.   An enlarged prostate in males. SYMPTOMS  Because the kidney damage in chronic kidney disease occurs slowly, symptoms develop slowly and may not be obvious until the kidney damage becomes severe. A person may have a kidney disease for years without showing any symptoms. Symptoms can include:   Swelling (edema) of the legs, ankles, or feet.   Tiredness (lethargy).   Nausea or vomiting.   Confusion.   Problems with urination, such as:   Decreased urine  production.   Frequent urination, especially at night.   Frequent accidents in children who are potty trained.   Muscle twitches and cramps.   Shortness of breath.  Weakness.   Persistent itchiness.   Loss of appetite.  Metallic taste in the mouth.  Trouble sleeping.  Slowed development in children.  Short stature in children. DIAGNOSIS  Chronic kidney disease may be detected and diagnosed by tests, including blood, urine, imaging, or kidney biopsy tests.  TREATMENT  Most chronic kidney diseases cannot be cured. Treatment usually involves relieving symptoms and preventing or slowing the progression of the disease. Treatment may include:   A special diet. You may need to avoid alcohol and foods thatare salty and high in potassium.   Medicines. These may:   Lower blood pressure.   Relieve anemia.   Relieve swelling.   Protect the bones. HOME CARE INSTRUCTIONS   Follow your prescribed diet.   Only take over-the-counter or prescription medicines as directed by your caregiver.  Do not take any new medicines (prescription, over-the-counter, or nutritional supplements) unless approved by your caregiver. Many medicines can worsen your kidney damage or need to have the dose adjusted.   Quit smoking if you are a smoker. Talk to your caregiver about a smoking cessation program.   Keep all follow-up appointments as directed by your caregiver. SEEK IMMEDIATE MEDICAL CARE IF:  Your symptoms get worse or you develop new symptoms.   You develop symptoms of end-stage kidney disease. These include:   Headaches.  Abnormally dark or light skin.   Numbness in the hands or feet.   Easy bruising.   Frequent hiccups.   Menstruation stops.   You have a fever.   You have decreased urine production.   You havepain or bleeding when urinating. MAKE SURE YOU:  Understand these instructions.  Will watch your condition.  Will get help right  away if you are not doing well or get worse. FOR MORE INFORMATION  American Association of Kidney Patients: BombTimer.gl National Kidney Foundation: www.kidney.[place]: https://mathis.com/ [facility]: www.lifeoptions.org and www.kidneyschool.org Document Released: 08/15/2008 Document Revised: 10/23/2012 Document Reviewed: 07/05/2012 [facility] Patient Information 2014 [place], [place].

## 2013-12-31 LAB — BASIC METABOLIC PANEL WITH GFR
BUN: 49 mg/dL — ABNORMAL HIGH (ref 6–23)
CHLORIDE: 107 meq/L (ref 96–112)
CO2: 21 mEq/L (ref 19–32)
CREATININE: 2.56 mg/dL — AB (ref 0.50–1.10)
Calcium: 10.3 mg/dL (ref 8.4–10.5)
GFR, Est African American: 21 mL/min — ABNORMAL LOW
GFR, Est Non African American: 18 mL/min — ABNORMAL LOW
GLUCOSE: 107 mg/dL — AB (ref 70–99)
POTASSIUM: 4.6 meq/L (ref 3.5–5.3)
Sodium: 138 mEq/L (ref 135–145)

## 2013-12-31 LAB — LIPID PANEL
CHOLESTEROL: 190 mg/dL (ref 0–200)
HDL: 61 mg/dL (ref >39)
LDL Cholesterol: 110 mg/dL — ABNORMAL HIGH (ref 0–99)
Total CHOL/HDL Ratio: 3.1 Ratio
Triglycerides: 97 mg/dL (ref <150)
VLDL: 19 mg/dL (ref 0–40)

## 2013-12-31 LAB — HEPATIC FUNCTION PANEL
ALT: 12 U/L (ref 0–35)
AST: 17 U/L (ref 0–37)
Albumin: 4.1 g/dL (ref 3.5–5.2)
Alkaline Phosphatase: 180 U/L — ABNORMAL HIGH (ref 39–117)
BILIRUBIN DIRECT: 0.1 mg/dL (ref 0.0–0.3)
BILIRUBIN TOTAL: 0.5 mg/dL (ref 0.2–1.2)
Indirect Bilirubin: 0.4 mg/dL (ref 0.2–1.2)
Total Protein: 6.3 g/dL (ref 6.0–8.3)

## 2014-01-01 NOTE — Progress Notes (Signed)
LMOM TO CALL TO SCHEDULE  

## 2014-01-08 ENCOUNTER — Other Ambulatory Visit (HOSPITAL_COMMUNITY): Payer: Self-pay | Admitting: *Deleted

## 2014-01-09 ENCOUNTER — Encounter (HOSPITAL_COMMUNITY): Payer: Medicare Other

## 2014-01-16 ENCOUNTER — Encounter (HOSPITAL_COMMUNITY): Payer: Medicare Other

## 2014-01-22 ENCOUNTER — Encounter (HOSPITAL_COMMUNITY)
Admission: RE | Admit: 2014-01-22 | Discharge: 2014-01-22 | Disposition: A | Discharge status: Home / Self Care | Payer: Medicare Other | Source: Ambulatory Visit | Attending: Nephrology | Admitting: Nephrology

## 2014-01-22 DIAGNOSIS — N3030 Trigonitis without hematuria: Secondary | ICD-10-CM | POA: Insufficient documentation

## 2014-01-22 DIAGNOSIS — D638 Anemia in other chronic diseases classified elsewhere: Secondary | ICD-10-CM | POA: Insufficient documentation

## 2014-01-22 LAB — RENAL FUNCTION PANEL
ALBUMIN: 3.7 g/dL (ref 3.5–5.2)
BUN: 71 mg/dL — ABNORMAL HIGH (ref 6–23)
CHLORIDE: 104 meq/L (ref 96–112)
CO2: 20 mEq/L (ref 19–32)
CREATININE: 3.33 mg/dL — AB (ref 0.50–1.10)
Calcium: 10.2 mg/dL (ref 8.4–10.5)
GFR calc Af Amer: 15 mL/min — ABNORMAL LOW (ref >90)
GFR, EST NON AFRICAN AMERICAN: 13 mL/min — AB (ref >90)
Glucose, Bld: 102 mg/dL — ABNORMAL HIGH (ref 70–99)
PHOSPHORUS: 3.8 mg/dL (ref 2.3–4.6)
Potassium: 4.2 mEq/L (ref 3.7–5.3)
Sodium: 139 mEq/L (ref 137–147)

## 2014-01-22 LAB — IRON AND TIBC
IRON: 67 ug/dL (ref 42–135)
SATURATION RATIOS: 27 % (ref 20–55)
TIBC: 249 ug/dL — ABNORMAL LOW (ref 250–470)
UIBC: 182 ug/dL (ref 125–400)

## 2014-01-22 LAB — POCT HEMOGLOBIN-HEMACUE: Hemoglobin: 10.9 g/dL — ABNORMAL LOW (ref 12.0–15.0)

## 2014-01-22 MED ORDER — EPOETIN ALFA 20000 UNIT/ML IJ SOLN
INTRAMUSCULAR | Status: AC
  Administered 2014-01-22: 20000 [IU] via SUBCUTANEOUS
  Filled 2014-01-22: qty 1

## 2014-01-22 MED ORDER — EPOETIN ALFA 20000 UNIT/ML IJ SOLN
20000.0000 [IU] | INTRAMUSCULAR | Status: DC
  Administered 2014-01-22: 20000 [IU] via SUBCUTANEOUS

## 2014-01-23 LAB — PTH, INTACT AND CALCIUM
CALCIUM TOTAL (PTH): 10 mg/dL (ref 8.4–10.5)
PTH: 436.2 pg/mL — ABNORMAL HIGH (ref 14.0–72.0)

## 2014-01-23 LAB — FERRITIN: FERRITIN: 853 ng/mL — AB (ref 10–291)

## 2014-01-30 ENCOUNTER — Ambulatory Visit (INDEPENDENT_AMBULATORY_CARE_PROVIDER_SITE_OTHER): Payer: Medicare Other | Admitting: *Deleted

## 2014-01-30 DIAGNOSIS — R6889 Other general symptoms and signs: Secondary | ICD-10-CM

## 2014-01-30 LAB — CBC WITH DIFFERENTIAL/PLATELET
BASOS PCT: 0 % (ref 0–1)
Basophils Absolute: 0 10*3/uL (ref 0.0–0.1)
Eosinophils Absolute: 0.2 10*3/uL (ref 0.0–0.7)
Eosinophils Relative: 5 % (ref 0–5)
HEMATOCRIT: 36.4 % (ref 36.0–46.0)
Hemoglobin: 11.7 g/dL — ABNORMAL LOW (ref 12.0–15.0)
Lymphocytes Relative: 25 % (ref 12–46)
Lymphs Abs: 1.2 10*3/uL (ref 0.7–4.0)
MCH: 29.5 pg (ref 26.0–34.0)
MCHC: 32.1 g/dL (ref 30.0–36.0)
MCV: 91.9 fL (ref 78.0–100.0)
MONO ABS: 0.3 10*3/uL (ref 0.1–1.0)
Monocytes Relative: 6 % (ref 3–12)
Neutro Abs: 3.1 10*3/uL (ref 1.7–7.7)
Neutrophils Relative %: 64 % (ref 43–77)
Platelets: 220 10*3/uL (ref 150–400)
RBC: 3.96 MIL/uL (ref 3.87–5.11)
RDW: 14.9 % (ref 11.5–15.5)
WBC: 4.9 10*3/uL (ref 4.0–10.5)

## 2014-01-30 LAB — BASIC METABOLIC PANEL WITH GFR
BUN: 50 mg/dL — ABNORMAL HIGH (ref 6–23)
CO2: 23 mEq/L (ref 19–32)
CREATININE: 2.62 mg/dL — AB (ref 0.50–1.10)
Calcium: 10 mg/dL (ref 8.4–10.5)
Chloride: 111 mEq/L (ref 96–112)
GFR, EST NON AFRICAN AMERICAN: 17 mL/min — AB
GFR, Est African American: 20 mL/min — ABNORMAL LOW
Glucose, Bld: 108 mg/dL — ABNORMAL HIGH (ref 70–99)
Potassium: 4.7 mEq/L (ref 3.5–5.3)
Sodium: 141 mEq/L (ref 135–145)

## 2014-01-30 NOTE — Addendum Note (Signed)
Addended by: Jazzie Trampe A on: 01/30/2014 08:44 AM   Modules accepted: Level of Service

## 2014-02-05 ENCOUNTER — Encounter: Payer: Self-pay | Admitting: Emergency Medicine

## 2014-02-05 ENCOUNTER — Ambulatory Visit (INDEPENDENT_AMBULATORY_CARE_PROVIDER_SITE_OTHER): Payer: Medicare Other | Admitting: Emergency Medicine

## 2014-02-05 VITALS — BP 158/82 | HR 62 | Temp 98.0°F | Resp 16 | Ht 61.5 in | Wt 132.0 lb

## 2014-02-05 DIAGNOSIS — M79604 Pain in right leg: Secondary | ICD-10-CM

## 2014-02-05 DIAGNOSIS — I10 Essential (primary) hypertension: Secondary | ICD-10-CM

## 2014-02-05 DIAGNOSIS — M1990 Unspecified osteoarthritis, unspecified site: Secondary | ICD-10-CM

## 2014-02-05 DIAGNOSIS — Z0000 Encounter for general adult medical examination without abnormal findings: Secondary | ICD-10-CM

## 2014-02-05 DIAGNOSIS — Z1331 Encounter for screening for depression: Secondary | ICD-10-CM

## 2014-02-05 DIAGNOSIS — Z23 Encounter for immunization: Secondary | ICD-10-CM

## 2014-02-05 DIAGNOSIS — R7309 Other abnormal glucose: Secondary | ICD-10-CM

## 2014-02-05 DIAGNOSIS — R6889 Other general symptoms and signs: Secondary | ICD-10-CM

## 2014-02-05 DIAGNOSIS — Z789 Other specified health status: Secondary | ICD-10-CM

## 2014-02-05 LAB — CBC WITH DIFFERENTIAL/PLATELET
BASOS ABS: 0 10*3/uL (ref 0.0–0.1)
Basophils Relative: 0 % (ref 0–1)
EOS ABS: 0.3 10*3/uL (ref 0.0–0.7)
Eosinophils Relative: 7 % — ABNORMAL HIGH (ref 0–5)
HCT: 35.8 % — ABNORMAL LOW (ref 36.0–46.0)
HEMOGLOBIN: 11.4 g/dL — AB (ref 12.0–15.0)
Lymphocytes Relative: 25 % (ref 12–46)
Lymphs Abs: 1.1 10*3/uL (ref 0.7–4.0)
MCH: 28.9 pg (ref 26.0–34.0)
MCHC: 31.8 g/dL (ref 30.0–36.0)
MCV: 90.6 fL (ref 78.0–100.0)
MONO ABS: 0.2 10*3/uL (ref 0.1–1.0)
MONOS PCT: 5 % (ref 3–12)
NEUTROS PCT: 63 % (ref 43–77)
Neutro Abs: 2.7 10*3/uL (ref 1.7–7.7)
Platelets: 176 10*3/uL (ref 150–400)
RBC: 3.95 MIL/uL (ref 3.87–5.11)
RDW: 15.2 % (ref 11.5–15.5)
WBC: 4.3 10*3/uL (ref 4.0–10.5)

## 2014-02-05 LAB — BASIC METABOLIC PANEL WITH GFR
BUN: 69 mg/dL — AB (ref 6–23)
CO2: 20 mEq/L (ref 19–32)
Calcium: 9.8 mg/dL (ref 8.4–10.5)
Chloride: 107 mEq/L (ref 96–112)
Creat: 2.94 mg/dL — ABNORMAL HIGH (ref 0.50–1.10)
GFR, Est African American: 17 mL/min — ABNORMAL LOW
GFR, Est Non African American: 15 mL/min — ABNORMAL LOW
GLUCOSE: 112 mg/dL — AB (ref 70–99)
Potassium: 4.1 mEq/L (ref 3.5–5.3)
Sodium: 137 mEq/L (ref 135–145)

## 2014-02-05 NOTE — Progress Notes (Signed)
Patient ID: Eileen Perez, female   DOB: [birth year], 73 y.o.   MRN: [id number] Subjective:   Eileen Perez is a 73 y.o. female who presents for Medicare Annual Wellness Visit and 3 month follow up on hypertension, prediabetes, hyperlipidemia, vitamin D def.  Date of last medicare wellness visit is unknown.    Her blood pressure has been controlled at home, today their BP is BP: 158/82 mmHg. She notes BP up with increased right knee/ hip pain and has f/u with Ortho pending.  She does workout. She denies chest pain, shortness of breath, dizziness.  She is not on cholesterol medication and denies myalgias. Her cholesterol is not at goal. The cholesterol last visit was:   Lab Results  Component Value Date   CHOL 190 12/30/2013   HDL 61 12/30/2013   LDLCALC 110* 12/30/2013   TRIG 97 12/30/2013   CHOLHDL 3.1 12/30/2013   She has been working on diet and exercise for prediabetes, and denies foot ulcerations, paresthesia of the feet, visual disturbances and weight loss. Last A1C in the office was:  Lab Results  Component Value Date   HGBA1C 5.9* 12/30/2013   Patient is on Vitamin D supplement.  Patient Care Team: Unk Pinto, MD as PCP - General (Internal Medicine) Gearlean Alf, MD as Consulting Physician (Orthopedic Surgery) Louis Meckel, MD as Consulting Physician (Nephrology) Johna Sheriff, MD as Consulting Physician (Ophthalmology) Winfield Cunas., MD as Consulting Physician (Gastroenterology)  Medication Review Current Outpatient Prescriptions on File Prior to Visit  Medication Sig Dispense Refill  . allopurinol (ZYLOPRIM) 300 MG tablet Take 150 mg by mouth every Monday, Wednesday, and Friday.      Marland Kitchen aspirin 81 MG tablet 1/2 tab po qd      . Cholecalciferol (HM VITAMIN D3) 4000 UNITS CAPS Take 4,000 Int'l Units by mouth.      . Colesevelam HCl (WELCHOL) 3.75 G PACK Take 3.75 each by mouth daily.       . Flaxseed, Linseed, (FLAXSEED OIL PO) Take 1,000 mg by mouth  daily.       . hydrochlorothiazide (HYDRODIURIL) 25 MG tablet Take 1 tablet (25 mg total) by mouth every morning. for BP and fluid  90 tablet  99  . labetalol (NORMODYNE) 200 MG tablet Take 1 tablet (200 mg total) by mouth 2 (two) times daily.  180 tablet  2  . NON FORMULARY every 6 (six) weeks. Iron Injection      . Omega-3 Fatty Acids (FISH OIL) 1000 MG CAPS Take 1,000 mg by mouth.      . Red Yeast Rice Extract (RED YEAST RICE PO) Take 1,200 tablets by mouth daily.       Marland Kitchen zinc gluconate 50 MG tablet Take 50 mg by mouth daily.      . [DISCONTINUED] diltiazem (CARDIZEM) 120 MG tablet Take 120 mg by mouth 2 (two) times daily.       No current facility-administered medications on file prior to visit.    Current Problems (verified) Patient Active Problem List   Diagnosis Date Noted  . Arthritis   . Anemia   . History of blood transfusion   . Pancreatic cyst   . Thyroid disease   . GERD (gastroesophageal reflux disease)   . Gout   . Osteopenia   . Diverticulosis   . Hiatal hernia   . Bruit   . Postoperative anemia due to acute blood loss 06/10/2013  . Chronic kidney disease 06/10/2013  . Hypertension 06/10/2013  .  OA (osteoarthritis) of knee 06/09/2013    Screening Tests Health Maintenance  Topic Date Due  . Pneumococcal Polysaccharide Vaccine Age 39 And Over  01/08/2005  . Tetanus/tdap  11/20/2013  . Influenza Vaccine  06/20/2014  . Mammogram  09/16/2015  . Colonoscopy  09/16/2023  . Zostavax  Completed     Immunization History  Administered Date(s) Administered  . DT 02/05/2014  . Influenza, High Dose Seasonal PF 09/25/2013  . Pneumococcal Conjugate-13 11/20/2009  . Td 11/21/2003  . Zoster 11/20/2005    Preventative care: Last colonoscopy: 03/21/12 Last mammogram: 05/01/12 Last pap smear/pelvic exam: 2011 WNL DEXA:08/02/12 osteopenia EYE: 11/21/12 WNL  Prior vaccinations: TD or Tdap: 02/05/14  Influenza: 09/25/13 Pneumococcal: 11/20/09 Shingles/Zostavax:  1/1/7  History reviewed: Past Medical History  Diagnosis Date  . Diabetes mellitus   . Hypertension   . Arthritis     Osteoarthritis  . Anemia   . History of blood transfusion   . Chronic kidney disease     Chronic renal insufficiency  . Pancreatic cyst 1999  . Thyroid disease     Hyperparathyroid   . GERD (gastroesophageal reflux disease)   . Gout   . Osteopenia   . Diverticulosis   . Hiatal hernia   . Hyperlipidemia    Past Surgical History  Procedure Laterality Date  . Pancreatic cyst excision  1999  . Knee surgery    . Joint replacement  2012    left knee  . Eye surgery Left     cataract extraction with IOL  . Cholecystectomy    . 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 Colonoscopy  03/21/12    Next one in 2018   History  Substance Use Topics  . Smoking status: Never Smoker   . Smokeless tobacco: Never Used  . Alcohol Use: No   Family History  Problem Relation Age of Onset  . Hypertension Mother   . Cancer Mother     Colon with METS  . Diabetes Mother   . Cancer Father     Prostate and Throat     Risk Factors: Osteoporosis: postmenopausal estrogen deficiency History of fracture in the past year: no  Tobacco History  Substance Use Topics  . Smoking status: Never Smoker   . Smokeless tobacco: Never Used  . Alcohol Use: No   She does not smoke.  Patient is not a former smoker. Are there smokers in your home (other than you)?  No  Alcohol Current alcohol use: social drinker  Caffeine Current caffeine use: coffee 1 /day  Exercise Exercise limitations: The patient is experiencing exercise intolerance (difficulty walking 10 feet on flat ground). Current exercise: housecleaning and no regular exercise  Nutrition/Diet Current diet: in general, a "healthy" diet    Cardiac risk factors: dyslipidemia,  hypertension and sedentary lifestyle.  Depression Screen Nurse depression screen reviewed.  (Note: if answer to either of the following is "Yes", a more complete depression screening is indicated)   Q1: Over the past two weeks, have you felt down, depressed or hopeless? No  Q2: Over the past two weeks, have you felt little interest or pleasure in doing things? No  Have you lost interest or pleasure in daily life? No  Do you often feel hopeless? No  Do you cry easily over simple problems? No  Activities of Daily Living Nurse ADLs screen reviewed.  In your present state of health, do you have any difficulty performing the following activities?:  Driving? Yes Managing money?  No Feeding yourself? No Getting from bed to chair? No Climbing a flight of stairs? No Preparing food and eating?: No Bathing or showering? No Getting dressed: No Getting to the toilet? No Using the toilet:No Moving around from place to place: No In the past year have you fallen or had a near fall?:No   Are you sexually active?  Yes  Do you have more than one partner?  No  Vision Difficulties: Yes  Hearing Difficulties: No Do you often ask people to speak up or repeat themselves? No Do you experience ringing or noises in your ears? No Do you have difficulty understanding soft or whispered voices? No  Cognition  Do you feel that you have a problem with memory?No  Do you often misplace items? No  Do you feel safe at home?  Yes  Advanced directives Does patient have a [place]? Yes Does patient have a Living Will? No    Objective:     Vision and hearing screens reviewed.   Blood pressure 158/82, pulse 62, temperature 98 F (36.7 C), temperature source Temporal, resp. rate 16, height 5' 1.5" (1.562 m), weight 132 lb (59.875 kg). Body mass index is 24.54 kg/(m^2).  General appearance: alert, no distress, WD/WN,  female Cognitive Testing  Alert? Yes  Normal  Appearance?Yes  Oriented to person? Yes  Place? Yes   Time? Yes  Recall of three objects?  Yes  Can perform simple calculations? Yes  Displays appropriate judgment?Yes  Can read the correct time from a watch face?Yes  HEENT: normocephalic, sclerae anicteric, TMs pearly, nares patent, no discharge or erythema, pharynx normal Oral cavity: MMM, no lesions Neck: supple, no lymphadenopathy, no thyromegaly, no masses Heart: RRR, normal S1, S2, no murmurs Lungs: CTA bilaterally, no wheezes, rhonchi, or rales Abdomen: +bs, soft, non tender, non distended, no masses, no hepatomegaly, no splenomegaly Musculoskeletal: nontender, no swelling, no obvious deformity, difficulty changing positions and ambulating with cain Extremities: no edema, no cyanosis, no clubbing Pulses: 2+ symmetric, upper and lower extremities, normal cap refill Neurological: alert, oriented x 3, CN2-12 intact, strength normal upper extremities and lower extremities, sensation normal throughout, DTRs 2+ throughout, no cerebellar signs, gait normal Psychiatric: normal affect, behavior normal, pleasant  Breast: nontender, no masses or lumps, no skin changes, no nipple discharge or inversion, no axillary lymphadenopathy Gyn: Normal external genitalia without lesions, vagina with normal mucosa, cervix without lesions, no cervical motion tenderness, no abnormal vaginal discharge.  Uterus and adnexa not enlarged, nontender, no masses.  Pap performed.   Rectal:    Assessment:  1.  3 month F/U for HTN, Cholesterol, Pre-Dm, D. Deficient. Needs healthy diet, cardio QD and obtain healthy weight. Check Labs, Check BP if >130/80 call office  2. Medicare wellness screen/CPE- Update screening labs/ History/ Immunizations/ Testing as needed. Advised healthy diet, QD exercise, increase H20 and continue RX/ Vitamins AD.   3. Arthritis/ leg weakness with gait difficulty- Try to increase activity/ strength [facility] FORM FILLED OUT- patient needs to  have continued access to transportation through [facility] due to her inability to drive and ambulate for long distances.   Plan:  See above During the course of the visit the patient was educated and counseled about appropriate screening and preventive services including:    Td vaccine  Screening mammography  Bone densitometry screening  Nutrition counseling   Screening  recommendations, referrals:  Vaccinations: Tdap vaccine yes  Influenza vaccine no Pneumococcal vaccine no Shingles vaccine no Hep B vaccine no  Nutrition assessed and recommended updates discussed Colonoscopy no Mammogram yes Pap smear no Pelvic exam yes Recommended yearly ophthalmology/optometry visit for glaucoma screening and checkup, YES Recommended yearly dental visit for hygiene and checkupYES Advanced directives - not done  Conditions/risks identified: BMI: Discussed weight loss, diet, and increase physical activity.  Increase physical activity: AHA recommends 150 minutes of physical activity a week.  Medications reviewed DEXA- declined Diabetes is at goal, ACE/ARB therapy: No, Reason not on Ace Inhibitor/ARB therapy:  Renal failure HX followed by Nephro Urinary Incontinence is not an issue: discussed non pharmacology and pharmacology options.  Fall risk: moderate- discussed PT, home fall assessment, medications.   Medicare Attestation I have personally reviewed: The patient's medical and social history Their use of alcohol, tobacco or illicit drugs Their current medications and supplements The patient's functional ability including ADLs,fall risks, home safety risks, cognitive, and hearing and visual impairment Diet and physical activities Evidence for depression or mood disorders  The patient's weight, height, BMI, and visual acuity have been recorded in the chart.  I have made referrals, counseling, and provided education to the patient based on review of the above and I have provided the  patient with a written personalized care plan for preventive services.     Kelby Aline, R, PA-C   02/09/2014   CPT [id number] first AWV CPT [phone] subsequent AWV

## 2014-02-05 NOTE — Patient Instructions (Signed)
Depression, Adult Depression is feeling sad, low, down in the dumps, blue, gloomy, or empty. In general, there are two kinds of depression:  Normal sadness or grief. This can happen after something upsetting. It often goes away on its own within 2 weeks. After losing a loved one (bereavement), normal sadness and grief may last longer than two weeks. It usually gets better with time.  Clinical depression. This kind lasts longer than normal sadness or grief. It keeps you from doing the things you normally do in life. It is often hard to function at home, work, or at school. It may affect your relationships with others. Treatment is often needed. GET HELP RIGHT AWAY IF:  You have thoughts about hurting yourself or others.  You lose touch with reality (psychotic symptoms). You may: See or hear things that are not real. Chronic Kidney Disease Chronic kidney disease occurs when the kidneys are damaged over a long period. The kidneys are two organs that lie on either side of the spine between the middle of the back and the front of the abdomen. The kidneys:  Remove wastes and extra water from the blood.  Produce important hormones. These help keep bones strong, regulate blood pressure, and help create red blood cells.  Balance the fluids and chemicals in the blood and tissues. A small amount of kidney damage may not cause problems, but a large amount of damage may make it difficult or impossible for the kidneys to work the way they should. If steps are not taken to slow down the kidney damage or stop it from getting worse, the kidneys may stop working permanently. Most of the time, chronic kidney disease does not go away. However, it can often be controlled, and those with the disease can usually live normal lives. CAUSES  The most common causes of chronic kidney disease are diabetes and high blood pressure (hypertension). Chronic kidney disease may also be caused by:  Diseases that cause kidneys'  filters to become inflamed.  Diseases that affect the immune system.  Genetic diseases.  Medicines that damage the kidneys, such as anti-inflammatory medicines. Poisoning or exposure to toxic substances.  A reoccurring kidney or urinary infection.  A problem with urine flow. This may be caused by:  Cancer.  Kidney stones.  An enlarged prostate in males. SYMPTOMS  Because the kidney damage in chronic kidney disease occurs slowly, symptoms develop slowly and may not be obvious until the kidney damage becomes severe. A person may have a kidney disease for years without showing any symptoms. Symptoms can include:  Swelling (edema) of the legs, ankles, or feet.  Tiredness (lethargy).  Nausea or vomiting.  Confusion.  Problems with urination, such as:  Decreased urine production.  Frequent urination, especially at night.  Frequent accidents in children who are potty trained.  Muscle twitches and cramps.  Shortness of breath. Weakness.  Persistent itchiness.  Loss of appetite. Metallic taste in the mouth. Trouble sleeping. Slowed development in children. Short stature in children. DIAGNOSIS  Chronic kidney disease may be detected and diagnosed by tests, including blood, urine, imaging, or kidney biopsy tests.  TREATMENT  Most chronic kidney diseases cannot be cured. Treatment usually involves relieving symptoms and preventing or slowing the progression of the disease. Treatment may include:  A special diet. You may need to avoid alcohol and foods thatare salty and high in potassium.  Medicines. These may:  Lower blood pressure.  Relieve anemia.  Relieve swelling.  Protect the bones. HOME CARE INSTRUCTIONS  Follow your prescribed diet.  Only take over-the-counter or prescription medicines as directed by your caregiver. Do not take any new medicines (prescription, over-the-counter, or nutritional supplements) unless approved by your caregiver. Many  medicines can worsen your kidney damage or need to have the dose adjusted.  Quit smoking if you are a smoker. Talk to your caregiver about a smoking cessation program.  Keep all follow-up appointments as directed by your caregiver. SEEK IMMEDIATE MEDICAL CARE IF: Your symptoms get worse or you develop new symptoms.  You develop symptoms of end-stage kidney disease. These include:  Headaches.  Abnormally dark or light skin.  Numbness in the hands or feet.  Easy bruising.  Frequent hiccups.  Menstruation stops.  You have a fever.  You have decreased urine production.  You havepain or bleeding when urinating. MAKE SURE YOU: Understand these instructions. Will watch your condition. Will get help right away if you are not doing well or get worse. FOR MORE INFORMATION  American Association of Kidney Patients: BombTimer.gl National Kidney Foundation: www.kidney.[place]: https://mathis.com/ [facility]: www.lifeoptions.org and www.kidneyschool.org Document Released: 08/15/2008 Document Revised: 10/23/2012 Document Reviewed: 07/05/2012 [facility] Patient Information 2014 [place], [place].    Have untrue beliefs about your life or people around you.  Your medicine is giving you problems. MAKE SURE YOU:  Understand these instructions.  Will watch your condition.  Will get help right away if you are not doing well or get worse. Document Released: 12/09/2010 Document Revised: 07/31/2012 Document Reviewed: 03/07/2012 [facility] Patient Information 2014 [place], [place].

## 2014-03-05 ENCOUNTER — Other Ambulatory Visit (HOSPITAL_COMMUNITY): Payer: Self-pay | Admitting: *Deleted

## 2014-03-06 ENCOUNTER — Encounter (HOSPITAL_COMMUNITY): Payer: Medicare Other

## 2014-03-27 ENCOUNTER — Encounter (HOSPITAL_COMMUNITY)
Admission: RE | Admit: 2014-03-27 | Discharge: 2014-03-27 | Disposition: A | Discharge status: Home / Self Care | Payer: Medicare Other | Source: Ambulatory Visit | Attending: Nephrology | Admitting: Nephrology

## 2014-03-27 DIAGNOSIS — D638 Anemia in other chronic diseases classified elsewhere: Secondary | ICD-10-CM | POA: Insufficient documentation

## 2014-03-27 DIAGNOSIS — N3030 Trigonitis without hematuria: Secondary | ICD-10-CM | POA: Insufficient documentation

## 2014-03-27 LAB — RENAL FUNCTION PANEL
Albumin: 3.6 g/dL (ref 3.5–5.2)
BUN: 56 mg/dL — ABNORMAL HIGH (ref 6–23)
CHLORIDE: 107 meq/L (ref 96–112)
CO2: 20 meq/L (ref 19–32)
Calcium: 10.6 mg/dL — ABNORMAL HIGH (ref 8.4–10.5)
Creatinine, Ser: 2.65 mg/dL — ABNORMAL HIGH (ref 0.50–1.10)
GFR calc Af Amer: 19 mL/min — ABNORMAL LOW (ref >90)
GFR calc non Af Amer: 17 mL/min — ABNORMAL LOW (ref >90)
GLUCOSE: 102 mg/dL — AB (ref 70–99)
Phosphorus: 3.3 mg/dL (ref 2.3–4.6)
Potassium: 5.1 mEq/L (ref 3.7–5.3)
SODIUM: 141 meq/L (ref 137–147)

## 2014-03-27 LAB — IRON AND TIBC
IRON: 99 ug/dL (ref 42–135)
Saturation Ratios: 38 % (ref 20–55)
TIBC: 258 ug/dL (ref 250–470)
UIBC: 159 ug/dL (ref 125–400)

## 2014-03-27 LAB — FERRITIN: Ferritin: 873 ng/mL — ABNORMAL HIGH (ref 10–291)

## 2014-03-27 LAB — POCT HEMOGLOBIN-HEMACUE: HEMOGLOBIN: 8.3 g/dL — AB (ref 12.0–15.0)

## 2014-03-27 MED ORDER — EPOETIN ALFA 20000 UNIT/ML IJ SOLN
INTRAMUSCULAR | Status: DC
Start: 2014-03-27 — End: 2014-03-28
  Filled 2014-03-27: qty 1

## 2014-03-27 MED ORDER — EPOETIN ALFA 20000 UNIT/ML IJ SOLN
20000.0000 [IU] | INTRAMUSCULAR | Status: DC
  Administered 2014-03-27: 20000 [IU] via SUBCUTANEOUS

## 2014-03-30 LAB — PTH, INTACT AND CALCIUM
Calcium, Total (PTH): 10.3 mg/dL (ref 8.4–10.5)
PTH: 251 pg/mL — ABNORMAL HIGH (ref 14.0–72.0)

## 2014-04-08 ENCOUNTER — Other Ambulatory Visit (HOSPITAL_COMMUNITY): Payer: Self-pay

## 2014-04-10 ENCOUNTER — Encounter (HOSPITAL_COMMUNITY)
Admission: RE | Admit: 2014-04-10 | Discharge: 2014-04-10 | Disposition: A | Discharge status: Home / Self Care | Payer: Medicare Other | Source: Ambulatory Visit | Attending: Nephrology | Admitting: Nephrology

## 2014-04-10 LAB — POCT HEMOGLOBIN-HEMACUE: Hemoglobin: 10.6 g/dL — ABNORMAL LOW (ref 12.0–15.0)

## 2014-04-10 MED ORDER — EPOETIN ALFA 20000 UNIT/ML IJ SOLN
INTRAMUSCULAR | Status: AC
  Administered 2014-04-10: 20000 [IU] via SUBCUTANEOUS
  Filled 2014-04-10: qty 1

## 2014-04-10 MED ORDER — EPOETIN ALFA 20000 UNIT/ML IJ SOLN
20000.0000 [IU] | INTRAMUSCULAR | Status: DC
  Administered 2014-04-10: 20000 [IU] via SUBCUTANEOUS

## 2014-04-16 ENCOUNTER — Ambulatory Visit (INDEPENDENT_AMBULATORY_CARE_PROVIDER_SITE_OTHER): Payer: Medicare Other | Admitting: Internal Medicine

## 2014-04-16 ENCOUNTER — Other Ambulatory Visit (INDEPENDENT_AMBULATORY_CARE_PROVIDER_SITE_OTHER): Payer: Medicare Other | Admitting: *Deleted

## 2014-04-16 ENCOUNTER — Encounter: Payer: Self-pay | Admitting: Internal Medicine

## 2014-04-16 VITALS — BP 138/74 | HR 64 | Temp 98.1°F | Resp 16 | Ht 61.5 in | Wt 133.0 lb

## 2014-04-16 DIAGNOSIS — Z79899 Other long term (current) drug therapy: Secondary | ICD-10-CM | POA: Insufficient documentation

## 2014-04-16 DIAGNOSIS — Z1212 Encounter for screening for malignant neoplasm of rectum: Secondary | ICD-10-CM

## 2014-04-16 DIAGNOSIS — N2581 Secondary hyperparathyroidism of renal origin: Secondary | ICD-10-CM | POA: Insufficient documentation

## 2014-04-16 DIAGNOSIS — E559 Vitamin D deficiency, unspecified: Secondary | ICD-10-CM | POA: Insufficient documentation

## 2014-04-16 DIAGNOSIS — R7309 Other abnormal glucose: Secondary | ICD-10-CM

## 2014-04-16 DIAGNOSIS — E782 Mixed hyperlipidemia: Secondary | ICD-10-CM | POA: Insufficient documentation

## 2014-04-16 DIAGNOSIS — I10 Essential (primary) hypertension: Secondary | ICD-10-CM

## 2014-04-16 HISTORY — DX: Vitamin D deficiency, unspecified: E55.9

## 2014-04-16 LAB — HEMOGLOBIN A1C
Hgb A1c MFr Bld: 5.9 % — ABNORMAL HIGH (ref <5.7)
MEAN PLASMA GLUCOSE: 123 mg/dL — AB (ref <117)

## 2014-04-16 LAB — CBC WITH DIFFERENTIAL/PLATELET
BASOS PCT: 0 % (ref 0–1)
Basophils Absolute: 0 10*3/uL (ref 0.0–0.1)
EOS ABS: 0.2 10*3/uL (ref 0.0–0.7)
EOS PCT: 3 % (ref 0–5)
HEMATOCRIT: 35.4 % — AB (ref 36.0–46.0)
HEMOGLOBIN: 11.4 g/dL — AB (ref 12.0–15.0)
LYMPHS PCT: 30 % (ref 12–46)
Lymphs Abs: 1.6 10*3/uL (ref 0.7–4.0)
MCH: 29 pg (ref 26.0–34.0)
MCHC: 32.2 g/dL (ref 30.0–36.0)
MCV: 90.1 fL (ref 78.0–100.0)
Monocytes Absolute: 0.3 10*3/uL (ref 0.1–1.0)
Monocytes Relative: 5 % (ref 3–12)
Neutro Abs: 3.3 10*3/uL (ref 1.7–7.7)
Neutrophils Relative %: 62 % (ref 43–77)
PLATELETS: 224 10*3/uL (ref 150–400)
RBC: 3.93 MIL/uL (ref 3.87–5.11)
RDW: 16.9 % — ABNORMAL HIGH (ref 11.5–15.5)
WBC: 5.4 10*3/uL (ref 4.0–10.5)

## 2014-04-16 LAB — POC HEMOCCULT BLD/STL (HOME/3-CARD/SCREEN)
Card #2 Fecal Occult Blod, POC: NEGATIVE
FECAL OCCULT BLD: NEGATIVE
Fecal Occult Blood, POC: NEGATIVE

## 2014-04-16 NOTE — Patient Instructions (Signed)

## 2014-04-16 NOTE — Progress Notes (Signed)
Patient ID: Eileen Perez, female   DOB: Oct 20, 1941, 73 y.o.   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    HTN predates since the 1990's. BP has been controlled and today's BP: 138/74 mmHg. Patient denies any cardiac type chest pain, palpitations, dyspnea/orthopnea/PND, dizziness, claudication, or dependent edema.   Hyperlipidemia is not controlled with diet & meds. Last Cholesterol was 190, Triglycerides were 97, HDL 61 and LDL slightly elevated at 110. Patient denies myalgias or other med SE's.    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  Currently patient is managing with diet alone. Patient denies any symptoms of reactive hypoglycemia, diabetic polys, paresthesias or visual blurring.   Further, Patient has history of Vitamin D Deficiency with last vitamin D of 39 in Nov 2014. Patient supplements vitamin D without any suspected side-effects. 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    Medication List   allopurinol 300 MG tablet  Commonly known as:  ZYLOPRIM  Take 150 mg by mouth every Monday, Wednesday, and Friday.     aspirin 81 MG tablet  1/2 tab po qd     BOOSTRIX 5-2.5-18.5 LF-MCG/0.5 injection  Generic drug:  Tdap     Fish Oil 1000 MG Caps  Take 1,000 mg by mouth.     FLAXSEED OIL PO  Take 1,000 mg by mouth daily.     HM VITAMIN D3 4000 UNITS Caps  Generic drug:  Cholecalciferol  Take 4,000 Int'l Units by mouth.     hydrochlorothiazide 25 MG tablet  Commonly known as:  HYDRODIURIL  Take 1 tablet (25 mg total) by mouth every morning. for BP and fluid     labetalol 200 MG tablet  Commonly known as:  NORMODYNE  Take 1 tablet (200 mg total) by mouth 2 (two) times daily.     multivitamin with  minerals tablet  Take 1 tablet by mouth daily.     NON FORMULARY  every 6 (six) weeks. Iron Injection     RED YEAST RICE PO  Take 1,200 tablets by mouth daily.     SLOW FE PO  Take 45 mg by mouth 2 (two) times daily.     WELCHOL 3.75 G Pack  Generic drug:  Colesevelam HCl  Take 3.75 each by mouth daily.     zinc gluconate 50 MG tablet  Take 50 mg by mouth daily.        PROCRIT   Allergies  Allergen Reactions  . Ace Inhibitors   . Nsaids    PMHx:   Past Medical History  Diagnosis Date  . Diabetes mellitus   . Hypertension   . Arthritis     Osteoarthritis  . Anemia   . History of blood transfusion   . Chronic kidney disease     Chronic renal insufficiency  . Pancreatic cyst 1999  . Thyroid disease     Hyperparathyroid   . GERD (gastroesophageal reflux disease)   . Gout   . Osteopenia   . Diverticulosis   . Hiatal hernia   . Hyperlipidemia     FHx:    Reviewed / unchanged  SHx:    Reviewed / unchanged   Systems Review: Constitutional:  Denies fever, chills, wt changes, headaches, insomnia, fatigue, night sweats, change in appetite. Eyes: Denies redness, blurred vision, diplopia, discharge, itchy, watery eyes.  ENT: Denies discharge, congestion, post nasal drip, epistaxis, sore throat, earache, hearing loss, dental pain, tinnitus, vertigo, sinus pain, snoring.  CV: Denies chest pain, palpitations, irregular heartbeat, syncope, dyspnea, diaphoresis, orthopnea, PND, claudication or edema. Respiratory: denies cough, dyspnea, DOE, pleurisy, hoarseness, laryngitis, wheezing.  Gastrointestinal: Denies dysphagia, odynophagia, heartburn, reflux, water brash, abdominal pain or cramps, nausea, vomiting, bloating, diarrhea, constipation, hematemesis, melena, hematochezia  or hemorrhoids. Genitourinary: Denies dysuria, frequency, urgency, nocturia, hesitancy, discharge, hematuria or flank pain. Musculoskeletal: Denies arthralgias, myalgias, stiffness, jt. swelling, pain,  limping or strain/sprain.  Skin: Denies pruritus, rash, hives, warts, acne, eczema or change in skin lesion(s). Neuro: No weakness, tremor, incoordination, spasms, paresthesia or pain. Psychiatric: Denies confusion, memory loss or sensory loss. Endo: Denies change in weight, skin or hair change.  Heme/Lymph: No excessive bleeding, bruising or enlarged lymph nodes.   Exam:  BP 138/74  Pulse 64  Temp(Src) 98.1 F (36.7 C) (Temporal)  Resp 16  Ht 5' 1.5" (1.562 m)  Wt 133 lb (60.328 kg)  BMI 24.73 kg/m2  Appears well nourished - in no distress. Eyes: PERRLA, EOMs, conjunctiva no swelling or erythema. Sinuses: No frontal/maxillary tenderness ENT/Mouth: EAC's clear, TM's nl w/o erythema, bulging. Nares clear w/o erythema, swelling, exudates. Oropharynx clear without erythema or exudates. Oral hygiene is good. Tongue normal, non obstructing. Hearing intact.  Neck: Supple. Thyroid nl. Car 2+/2+ without bruits, nodes or JVD. Chest: Respirations nl with BS clear & equal w/o rales, rhonchi, wheezing or stridor.  Cor: Heart sounds normal w/ regular rate and rhythm without sig. murmurs, gallops, clicks, or rubs. Peripheral pulses normal and equal  without edema.  Abdomen: Soft & bowel sounds normal. Non-tender w/o guarding, rebound, hernias, masses, or organomegaly.  Lymphatics: Unremarkable.  Musculoskeletal: Full ROM all peripheral extremities, joint stability, 5/5 strength, and normal gait.  Skin: Warm, dry without exposed rashes, lesions or ecchymosis apparent.  Neuro: Cranial nerves intact, reflexes equal bilaterally. Sensory-motor testing grossly intact. Tendon reflexes grossly intact.  Pysch: Alert & oriented x 3. Insight and judgement nl & appropriate. No ideations.  Assessment and Plan:  1. Hypertension - Continue monitor blood pressure at home. Continue diet/meds same.  2. Hyperlipidemia - Continue diet/meds, exercise,& lifestyle modifications. Continue monitor periodic  cholesterol/liver & renal functions   3. 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 Monitor appropriate labs.  4. Vitamin D Deficiency - Continue supplementation.  Recommended regular exercise, BP monitoring, weight control, and discussed med and SE's. Recommended labs to assess and monitor clinical status. Further disposition pending results of labs.

## 2014-04-17 LAB — TSH: TSH: 1.871 u[IU]/mL (ref 0.350–4.500)

## 2014-04-17 LAB — LIPID PANEL
Cholesterol: 212 mg/dL — ABNORMAL HIGH (ref 0–200)
HDL: 74 mg/dL (ref >39)
LDL CALC: 126 mg/dL — AB (ref 0–99)
TRIGLYCERIDES: 62 mg/dL (ref <150)
Total CHOL/HDL Ratio: 2.9 Ratio
VLDL: 12 mg/dL (ref 0–40)

## 2014-04-17 LAB — BASIC METABOLIC PANEL WITH GFR
BUN: 67 mg/dL — ABNORMAL HIGH (ref 6–23)
CALCIUM: 10.3 mg/dL (ref 8.4–10.5)
CO2: 20 mEq/L (ref 19–32)
CREATININE: 3.43 mg/dL — AB (ref 0.50–1.10)
Chloride: 106 mEq/L (ref 96–112)
GFR, EST NON AFRICAN AMERICAN: 13 mL/min — AB
GFR, Est African American: 14 mL/min — ABNORMAL LOW
Glucose, Bld: 88 mg/dL (ref 70–99)
Potassium: 5.2 mEq/L (ref 3.5–5.3)
Sodium: 139 mEq/L (ref 135–145)

## 2014-04-17 LAB — VITAMIN D 25 HYDROXY (VIT D DEFICIENCY, FRACTURES): VIT D 25 HYDROXY: 63 ng/mL (ref 30–89)

## 2014-04-17 LAB — INSULIN, FASTING: INSULIN FASTING, SERUM: 9 u[IU]/mL (ref 3–28)

## 2014-04-17 LAB — HEPATIC FUNCTION PANEL
ALBUMIN: 3.8 g/dL (ref 3.5–5.2)
ALT: 12 U/L (ref 0–35)
AST: 17 U/L (ref 0–37)
Alkaline Phosphatase: 185 U/L — ABNORMAL HIGH (ref 39–117)
BILIRUBIN TOTAL: 0.5 mg/dL (ref 0.2–1.2)
Bilirubin, Direct: 0.1 mg/dL (ref 0.0–0.3)
Indirect Bilirubin: 0.4 mg/dL (ref 0.2–1.2)
TOTAL PROTEIN: 6.6 g/dL (ref 6.0–8.3)

## 2014-04-17 LAB — MAGNESIUM: MAGNESIUM: 1.9 mg/dL (ref 1.5–2.5)

## 2014-04-24 ENCOUNTER — Encounter (HOSPITAL_COMMUNITY): Payer: Medicare Other

## 2014-05-08 ENCOUNTER — Encounter (HOSPITAL_COMMUNITY)
Admission: RE | Admit: 2014-05-08 | Discharge: 2014-05-08 | Disposition: A | Discharge status: Home / Self Care | Payer: Medicare Other | Source: Ambulatory Visit | Attending: Nephrology | Admitting: Nephrology

## 2014-05-08 ENCOUNTER — Encounter (HOSPITAL_COMMUNITY): Payer: Medicare Other

## 2014-05-08 DIAGNOSIS — N3030 Trigonitis without hematuria: Secondary | ICD-10-CM | POA: Insufficient documentation

## 2014-05-08 DIAGNOSIS — D638 Anemia in other chronic diseases classified elsewhere: Secondary | ICD-10-CM | POA: Insufficient documentation

## 2014-05-08 LAB — IRON AND TIBC
Iron: 72 ug/dL (ref 42–135)
SATURATION RATIOS: 32 % (ref 20–55)
TIBC: 223 ug/dL — ABNORMAL LOW (ref 250–470)
UIBC: 151 ug/dL (ref 125–400)

## 2014-05-08 LAB — RENAL FUNCTION PANEL
ALBUMIN: 3.3 g/dL — AB (ref 3.5–5.2)
BUN: 67 mg/dL — AB (ref 6–23)
CHLORIDE: 106 meq/L (ref 96–112)
CO2: 20 mEq/L (ref 19–32)
Calcium: 10.6 mg/dL — ABNORMAL HIGH (ref 8.4–10.5)
Creatinine, Ser: 2.83 mg/dL — ABNORMAL HIGH (ref 0.50–1.10)
GFR calc Af Amer: 18 mL/min — ABNORMAL LOW (ref >90)
GFR, EST NON AFRICAN AMERICAN: 15 mL/min — AB (ref >90)
Glucose, Bld: 111 mg/dL — ABNORMAL HIGH (ref 70–99)
PHOSPHORUS: 3.8 mg/dL (ref 2.3–4.6)
Potassium: 4.9 mEq/L (ref 3.7–5.3)
Sodium: 141 mEq/L (ref 137–147)

## 2014-05-08 LAB — FERRITIN: FERRITIN: 636 ng/mL — AB (ref 10–291)

## 2014-05-08 MED ORDER — EPOETIN ALFA 20000 UNIT/ML IJ SOLN
INTRAMUSCULAR | Status: AC
  Filled 2014-05-08: qty 1

## 2014-05-08 MED ORDER — EPOETIN ALFA 20000 UNIT/ML IJ SOLN
20000.0000 [IU] | INTRAMUSCULAR | Status: DC
  Administered 2014-05-08: 20000 [IU] via SUBCUTANEOUS

## 2014-05-11 LAB — POCT HEMOGLOBIN-HEMACUE: Hemoglobin: 11.4 g/dL — ABNORMAL LOW (ref 12.0–15.0)

## 2014-05-11 LAB — PTH, INTACT AND CALCIUM
CALCIUM TOTAL (PTH): 9.8 mg/dL (ref 8.4–10.5)
PTH: 358.9 pg/mL — ABNORMAL HIGH (ref 14.0–72.0)

## 2014-05-20 ENCOUNTER — Other Ambulatory Visit: Payer: Self-pay | Admitting: Internal Medicine

## 2014-05-21 ENCOUNTER — Other Ambulatory Visit: Payer: Self-pay | Admitting: *Deleted

## 2014-05-21 MED ORDER — ALLOPURINOL 300 MG PO TABS: ORAL_TABLET | ORAL | Status: DC

## 2014-06-05 ENCOUNTER — Encounter (HOSPITAL_COMMUNITY): Payer: Medicare Other

## 2014-06-19 ENCOUNTER — Encounter (HOSPITAL_COMMUNITY)
Admission: RE | Admit: 2014-06-19 | Discharge: 2014-06-19 | Disposition: A | Discharge status: Home / Self Care | Payer: Medicare Other | Source: Ambulatory Visit | Attending: Nephrology | Admitting: Nephrology

## 2014-06-19 DIAGNOSIS — D631 Anemia in chronic kidney disease: Secondary | ICD-10-CM | POA: Diagnosis not present

## 2014-06-19 DIAGNOSIS — N039 Chronic nephritic syndrome with unspecified morphologic changes: Secondary | ICD-10-CM | POA: Diagnosis not present

## 2014-06-19 DIAGNOSIS — I129 Hypertensive chronic kidney disease with stage 1 through stage 4 chronic kidney disease, or unspecified chronic kidney disease: Secondary | ICD-10-CM | POA: Diagnosis not present

## 2014-06-19 DIAGNOSIS — N184 Chronic kidney disease, stage 4 (severe): Secondary | ICD-10-CM | POA: Insufficient documentation

## 2014-06-19 LAB — RENAL FUNCTION PANEL
ALBUMIN: 3.5 g/dL (ref 3.5–5.2)
ANION GAP: 15 (ref 5–15)
BUN: 60 mg/dL — AB (ref 6–23)
CALCIUM: 10.5 mg/dL (ref 8.4–10.5)
CO2: 21 mEq/L (ref 19–32)
Chloride: 106 mEq/L (ref 96–112)
Creatinine, Ser: 2.9 mg/dL — ABNORMAL HIGH (ref 0.50–1.10)
GFR calc Af Amer: 17 mL/min — ABNORMAL LOW (ref >90)
GFR calc non Af Amer: 15 mL/min — ABNORMAL LOW (ref >90)
Glucose, Bld: 90 mg/dL (ref 70–99)
PHOSPHORUS: 3.8 mg/dL (ref 2.3–4.6)
POTASSIUM: 5.1 meq/L (ref 3.7–5.3)
SODIUM: 142 meq/L (ref 137–147)

## 2014-06-19 LAB — IRON AND TIBC
Iron: 94 ug/dL (ref 42–135)
SATURATION RATIOS: 39 % (ref 20–55)
TIBC: 239 ug/dL — AB (ref 250–470)
UIBC: 145 ug/dL (ref 125–400)

## 2014-06-19 LAB — FERRITIN: FERRITIN: 707 ng/mL — AB (ref 10–291)

## 2014-06-19 LAB — POCT HEMOGLOBIN-HEMACUE: Hemoglobin: 11.2 g/dL — ABNORMAL LOW (ref 12.0–15.0)

## 2014-06-19 MED ORDER — EPOETIN ALFA 20000 UNIT/ML IJ SOLN
20000.0000 [IU] | INTRAMUSCULAR | Status: DC
  Administered 2014-06-19: 20000 [IU] via SUBCUTANEOUS

## 2014-06-19 MED ORDER — EPOETIN ALFA 20000 UNIT/ML IJ SOLN
INTRAMUSCULAR | Status: AC
  Filled 2014-06-19: qty 1

## 2014-06-22 LAB — PTH, INTACT AND CALCIUM
Calcium, Total (PTH): 10.2 mg/dL (ref 8.4–10.5)
PTH: 243.2 pg/mL — ABNORMAL HIGH (ref 14.0–72.0)

## 2014-07-16 ENCOUNTER — Other Ambulatory Visit (HOSPITAL_COMMUNITY): Payer: Self-pay | Admitting: *Deleted

## 2014-07-17 ENCOUNTER — Encounter (HOSPITAL_COMMUNITY)
Admission: RE | Admit: 2014-07-17 | Discharge: 2014-07-17 | Disposition: A | Discharge status: Home / Self Care | Payer: Medicare Other | Source: Ambulatory Visit | Attending: Nephrology | Admitting: Nephrology

## 2014-07-17 DIAGNOSIS — N183 Chronic kidney disease, stage 3 unspecified: Secondary | ICD-10-CM | POA: Diagnosis present

## 2014-07-17 DIAGNOSIS — N039 Chronic nephritic syndrome with unspecified morphologic changes: Principal | ICD-10-CM

## 2014-07-17 DIAGNOSIS — I129 Hypertensive chronic kidney disease with stage 1 through stage 4 chronic kidney disease, or unspecified chronic kidney disease: Secondary | ICD-10-CM | POA: Diagnosis present

## 2014-07-17 DIAGNOSIS — D631 Anemia in chronic kidney disease: Secondary | ICD-10-CM | POA: Insufficient documentation

## 2014-07-17 LAB — POCT HEMOGLOBIN-HEMACUE: Hemoglobin: 11.2 g/dL — ABNORMAL LOW (ref 12.0–15.0)

## 2014-07-17 MED ORDER — EPOETIN ALFA 20000 UNIT/ML IJ SOLN
INTRAMUSCULAR | Status: AC
  Filled 2014-07-17: qty 1

## 2014-07-17 MED ORDER — EPOETIN ALFA 20000 UNIT/ML IJ SOLN
20000.0000 [IU] | INTRAMUSCULAR | Status: DC
  Administered 2014-07-17: 20000 [IU] via SUBCUTANEOUS

## 2014-07-30 ENCOUNTER — Encounter: Payer: Self-pay | Admitting: Emergency Medicine

## 2014-08-12 ENCOUNTER — Encounter: Payer: Self-pay | Admitting: Emergency Medicine

## 2014-08-12 ENCOUNTER — Ambulatory Visit (INDEPENDENT_AMBULATORY_CARE_PROVIDER_SITE_OTHER): Payer: Medicare Other | Admitting: Emergency Medicine

## 2014-08-12 ENCOUNTER — Other Ambulatory Visit: Payer: Self-pay | Admitting: Emergency Medicine

## 2014-08-12 VITALS — BP 186/102 | HR 64 | Temp 98.0°F | Resp 16 | Ht 61.5 in | Wt 134.0 lb

## 2014-08-12 DIAGNOSIS — R7309 Other abnormal glucose: Secondary | ICD-10-CM

## 2014-08-12 DIAGNOSIS — I151 Hypertension secondary to other renal disorders: Secondary | ICD-10-CM

## 2014-08-12 DIAGNOSIS — I150 Renovascular hypertension: Secondary | ICD-10-CM

## 2014-08-12 DIAGNOSIS — E559 Vitamin D deficiency, unspecified: Secondary | ICD-10-CM

## 2014-08-12 DIAGNOSIS — R6889 Other general symptoms and signs: Secondary | ICD-10-CM

## 2014-08-12 DIAGNOSIS — D649 Anemia, unspecified: Secondary | ICD-10-CM

## 2014-08-12 DIAGNOSIS — M109 Gout, unspecified: Secondary | ICD-10-CM

## 2014-08-12 DIAGNOSIS — M79609 Pain in unspecified limb: Secondary | ICD-10-CM

## 2014-08-12 DIAGNOSIS — R5383 Other fatigue: Secondary | ICD-10-CM

## 2014-08-12 DIAGNOSIS — E782 Mixed hyperlipidemia: Secondary | ICD-10-CM

## 2014-08-12 DIAGNOSIS — M79604 Pain in right leg: Secondary | ICD-10-CM

## 2014-08-12 DIAGNOSIS — I10 Essential (primary) hypertension: Secondary | ICD-10-CM

## 2014-08-12 DIAGNOSIS — N2889 Other specified disorders of kidney and ureter: Secondary | ICD-10-CM

## 2014-08-12 DIAGNOSIS — E538 Deficiency of other specified B group vitamins: Secondary | ICD-10-CM

## 2014-08-12 DIAGNOSIS — Z0000 Encounter for general adult medical examination without abnormal findings: Secondary | ICD-10-CM

## 2014-08-12 DIAGNOSIS — R5381 Other malaise: Secondary | ICD-10-CM

## 2014-08-12 LAB — CBC WITH DIFFERENTIAL/PLATELET
Basophils Absolute: 0 10*3/uL (ref 0.0–0.1)
Basophils Relative: 0 % (ref 0–1)
EOS ABS: 0.2 10*3/uL (ref 0.0–0.7)
EOS PCT: 4 % (ref 0–5)
HCT: 37.1 % (ref 36.0–46.0)
Hemoglobin: 12 g/dL (ref 12.0–15.0)
LYMPHS ABS: 1.6 10*3/uL (ref 0.7–4.0)
Lymphocytes Relative: 34 % (ref 12–46)
MCH: 28.6 pg (ref 26.0–34.0)
MCHC: 32.3 g/dL (ref 30.0–36.0)
MCV: 88.5 fL (ref 78.0–100.0)
Monocytes Absolute: 0.4 10*3/uL (ref 0.1–1.0)
Monocytes Relative: 8 % (ref 3–12)
NEUTROS PCT: 54 % (ref 43–77)
Neutro Abs: 2.6 10*3/uL (ref 1.7–7.7)
Platelets: 189 10*3/uL (ref 150–400)
RBC: 4.19 MIL/uL (ref 3.87–5.11)
RDW: 16.3 % — AB (ref 11.5–15.5)
WBC: 4.8 10*3/uL (ref 4.0–10.5)

## 2014-08-12 NOTE — Patient Instructions (Signed)
Sciatica with Rehab The sciatic nerve runs from the back down the leg and is responsible for sensation and control of the muscles in the back (posterior) side of the thigh, lower leg, and foot. Sciatica is a condition that is characterized by inflammation of this nerve.  SYMPTOMS   Signs of nerve damage, including numbness and/or weakness along the posterior side of the lower extremity.  Pain in the back of the thigh that may also travel down the leg.  Pain that worsens when sitting for long periods of time.  Occasionally, pain in the back or buttock. CAUSES  Inflammation of the sciatic nerve is the cause of sciatica. The inflammation is due to something irritating the nerve. Common sources of irritation include:  Sitting for long periods of time.  Direct trauma to the nerve.  Arthritis of the spine.  Herniated or ruptured disk.  Slipping of the vertebrae (spondylolisthesis).  Pressure from soft tissues, such as muscles or ligament-like tissue (fascia). RISK INCREASES WITH:  Sports that place pressure or stress on the spine (football or weightlifting).  Poor strength and flexibility.  Failure to warm up properly before activity.  Family history of low back pain or disk disorders.  Previous back injury or surgery.  Poor body mechanics, especially when lifting, or poor posture. PREVENTION   Warm up and stretch properly before activity.  Maintain physical fitness:  Strength, flexibility, and endurance.  Cardiovascular fitness.  Learn and use proper technique, especially with posture and lifting. When possible, have coach correct improper technique.  Avoid activities that place stress on the spine. PROGNOSIS If treated properly, then sciatica usually resolves within 6 weeks. However, occasionally surgery is necessary.  RELATED COMPLICATIONS   Permanent nerve damage, including pain, numbness, tingle, or weakness.  Chronic back pain.  Risks of surgery: infection,  bleeding, nerve damage, or damage to surrounding tissues. TREATMENT Treatment initially involves resting from any activities that aggravate your symptoms. The use of ice and medication may help reduce pain and inflammation. The use of strengthening and stretching exercises may help reduce pain with activity. These exercises may be performed at home or with referral to a therapist. A therapist may recommend further treatments, such as transcutaneous electronic nerve stimulation (TENS) or ultrasound. Your caregiver may recommend corticosteroid injections to help reduce inflammation of the sciatic nerve. If symptoms persist despite non-surgical (conservative) treatment, then surgery may be recommended. MEDICATION  If pain medication is necessary, then nonsteroidal anti-inflammatory medications, such as aspirin and ibuprofen, or other minor pain relievers, such as acetaminophen, are often recommended.  Do not take pain medication for 7 days before surgery.  Prescription pain relievers may be given if deemed necessary by your caregiver. Use only as directed and only as much as you need.  Ointments applied to the skin may be helpful.  Corticosteroid injections may be given by your caregiver. These injections should be reserved for the most serious cases, because they may only be given a certain number of times. HEAT AND COLD  Cold treatment (icing) relieves pain and reduces inflammation. Cold treatment should be applied for 10 to 15 minutes every 2 to 3 hours for inflammation and pain and immediately after any activity that aggravates your symptoms. Use ice packs or massage the area with a piece of ice (ice massage).  Heat treatment may be used prior to performing the stretching and strengthening activities prescribed by your caregiver, physical therapist, or athletic trainer. Use a heat pack or soak the injury in warm water.   SEEK MEDICAL CARE IF:  Treatment seems to offer no benefit, or the condition  worsens.  Any medications produce adverse side effects. EXERCISES  RANGE OF MOTION (ROM) AND STRETCHING EXERCISES - Sciatica Most people with sciatic will find that their symptoms worsen with either excessive bending forward (flexion) or arching at the low back (extension). The exercises which will help resolve your symptoms will focus on the opposite motion. Your physician, physical therapist or athletic trainer will help you determine which exercises will be most helpful to resolve your low back pain. Do not complete any exercises without first consulting with your clinician. Discontinue any exercises which worsen your symptoms until you speak to your clinician. If you have pain, numbness or tingling which travels down into your buttocks, leg or foot, the goal of the therapy is for these symptoms to move closer to your back and eventually resolve. Occasionally, these leg symptoms will get better, but your low back pain may worsen; this is typically an indication of progress in your rehabilitation. Be certain to be very alert to any changes in your symptoms and the activities in which you participated in the 24 hours prior to the change. Sharing this information with your clinician will allow him/her to most efficiently treat your condition. These exercises may help you when beginning to rehabilitate your injury. Your symptoms may resolve with or without further involvement from your physician, physical therapist or athletic trainer. While completing these exercises, remember:   Restoring tissue flexibility helps normal motion to return to the joints. This allows healthier, less painful movement and activity.  An effective stretch should be held for at least 30 seconds.  A stretch should never be painful. You should only feel a gentle lengthening or release in the stretched tissue. FLEXION RANGE OF MOTION AND STRETCHING EXERCISES: STRETCH - Flexion, Single Knee to Chest   Lie on a firm bed or floor  with both legs extended in front of you.  Keeping one leg in contact with the floor, bring your opposite knee to your chest. Hold your leg in place by either grabbing behind your thigh or at your knee.  Pull until you feel a gentle stretch in your low back. Hold __________ seconds.  Slowly release your grasp and repeat the exercise with the opposite side. Repeat __________ times. Complete this exercise __________ times per day.  STRETCH - Flexion, Double Knee to Chest  Lie on a firm bed or floor with both legs extended in front of you.  Keeping one leg in contact with the floor, bring your opposite knee to your chest.  Tense your stomach muscles to support your back and then lift your other knee to your chest. Hold your legs in place by either grabbing behind your thighs or at your knees.  Pull both knees toward your chest until you feel a gentle stretch in your low back. Hold __________ seconds.  Tense your stomach muscles and slowly return one leg at a time to the floor. Repeat __________ times. Complete this exercise __________ times per day.  STRETCH - Low Trunk Rotation   Lie on a firm bed or floor. Keeping your legs in front of you, bend your knees so they are both pointed toward the ceiling and your feet are flat on the floor.  Extend your arms out to the side. This will stabilize your upper body by keeping your shoulders in contact with the floor.  Gently and slowly drop both knees together to one side until   you feel a gentle stretch in your low back. Hold for __________ seconds.  Tense your stomach muscles to support your low back as you bring your knees back to the starting position. Repeat the exercise to the other side. Repeat __________ times. Complete this exercise __________ times per day  EXTENSION RANGE OF MOTION AND FLEXIBILITY EXERCISES: STRETCH - Extension, Prone on Elbows  Lie on your stomach on the floor, a bed will be too soft. Place your palms about shoulder  width apart and at the height of your head.  Place your elbows under your shoulders. If this is too painful, stack pillows under your chest.  Allow your body to relax so that your hips drop lower and make contact more completely with the floor.  Hold this position for __________ seconds.  Slowly return to lying flat on the floor. Repeat __________ times. Complete this exercise __________ times per day.  RANGE OF MOTION - Extension, Prone Press Ups  Lie on your stomach on the floor, a bed will be too soft. Place your palms about shoulder width apart and at the height of your head.  Keeping your back as relaxed as possible, slowly straighten your elbows while keeping your hips on the floor. You may adjust the placement of your hands to maximize your comfort. As you gain motion, your hands will come more underneath your shoulders.  Hold this position __________ seconds.  Slowly return to lying flat on the floor. Repeat __________ times. Complete this exercise __________ times per day.  STRENGTHENING EXERCISES - Sciatica  These exercises may help you when beginning to rehabilitate your injury. These exercises should be done near your "sweet spot." This is the neutral, low-back arch, somewhere between fully rounded and fully arched, that is your least painful position. When performed in this safe range of motion, these exercises can be used for people who have either a flexion or extension based injury. These exercises may resolve your symptoms with or without further involvement from your physician, physical therapist or athletic trainer. While completing these exercises, remember:   Muscles can gain both the endurance and the strength needed for everyday activities through controlled exercises.  Complete these exercises as instructed by your physician, physical therapist or athletic trainer. Progress with the resistance and repetition exercises only as your caregiver advises.  You may  experience muscle soreness or fatigue, but the pain or discomfort you are trying to eliminate should never worsen during these exercises. If this pain does worsen, stop and make certain you are following the directions exactly. If the pain is still present after adjustments, discontinue the exercise until you can discuss the trouble with your clinician. STRENGTHENING - Deep Abdominals, Pelvic Tilt   Lie on a firm bed or floor. Keeping your legs in front of you, bend your knees so they are both pointed toward the ceiling and your feet are flat on the floor.  Tense your lower abdominal muscles to press your low back into the floor. This motion will rotate your pelvis so that your tail bone is scooping upwards rather than pointing at your feet or into the floor.  With a gentle tension and even breathing, hold this position for __________ seconds. Repeat __________ times. Complete this exercise __________ times per day.  STRENGTHENING - Abdominals, Crunches   Lie on a firm bed or floor. Keeping your legs in front of you, bend your knees so they are both pointed toward the ceiling and your feet are flat on the   floor. Cross your arms over your chest.  Slightly tip your chin down without bending your neck.  Tense your abdominals and slowly lift your trunk high enough to just clear your shoulder blades. Lifting higher can put excessive stress on the low back and does not further strengthen your abdominal muscles.  Control your return to the starting position. Repeat __________ times. Complete this exercise __________ times per day.  STRENGTHENING - Quadruped, Opposite UE/LE Lift  Assume a hands and knees position on a firm surface. Keep your hands under your shoulders and your knees under your hips. You may place padding under your knees for comfort.  Find your neutral spine and gently tense your abdominal muscles so that you can maintain this position. Your shoulders and hips should form a rectangle  that is parallel with the floor and is not twisted.  Keeping your trunk steady, lift your right hand no higher than your shoulder and then your left leg no higher than your hip. Make sure you are not holding your breath. Hold this position __________ seconds.  Continuing to keep your abdominal muscles tense and your back steady, slowly return to your starting position. Repeat with the opposite arm and leg. Repeat __________ times. Complete this exercise __________ times per day.  STRENGTHENING - Abdominals and Quadriceps, Straight Leg Raise   Lie on a firm bed or floor with both legs extended in front of you.  Keeping one leg in contact with the floor, bend the other knee so that your foot can rest flat on the floor.  Find your neutral spine, and tense your abdominal muscles to maintain your spinal position throughout the exercise.  Slowly lift your straight leg off the floor about 6 inches for a count of 15, making sure to not hold your breath.  Still keeping your neutral spine, slowly lower your leg all the way to the floor. Repeat this exercise with each leg __________ times. Complete this exercise __________ times per day. POSTURE AND BODY MECHANICS CONSIDERATIONS - Sciatica Keeping correct posture when sitting, standing or completing your activities will reduce the stress put on different body tissues, allowing injured tissues a chance to heal and limiting painful experiences. The following are general guidelines for improved posture. Your physician or physical therapist will provide you with any instructions specific to your needs. While reading these guidelines, remember:  The exercises prescribed by your provider will help you have the flexibility and strength to maintain correct postures.  The correct posture provides the optimal environment for your joints to work. All of your joints have less wear and tear when properly supported by a spine with good posture. This means you will  experience a healthier, less painful body.  Correct posture must be practiced with all of your activities, especially prolonged sitting and standing. Correct posture is as important when doing repetitive low-stress activities (typing) as it is when doing a single heavy-load activity (lifting). RESTING POSITIONS Consider which positions are most painful for you when choosing a resting position. If you have pain with flexion-based activities (sitting, bending, stooping, squatting), choose a position that allows you to rest in a less flexed posture. You would want to avoid curling into a fetal position on your side. If your pain worsens with extension-based activities (prolonged standing, working overhead), avoid resting in an extended position such as sleeping on your stomach. Most people will find more comfort when they rest with their spine in a more neutral position, neither too rounded nor too   arched. Lying on a non-sagging bed on your side with a pillow between your knees, or on your back with a pillow under your knees will often provide some relief. Keep in mind, being in any one position for a prolonged period of time, no matter how correct your posture, can still lead to stiffness. PROPER SITTING POSTURE In order to minimize stress and discomfort on your spine, you must sit with correct posture Sitting with good posture should be effortless for a healthy body. Returning to good posture is a gradual process. Many people can work toward this most comfortably by using various supports until they have the flexibility and strength to maintain this posture on their own. When sitting with proper posture, your ears will fall over your shoulders and your shoulders will fall over your hips. You should use the back of the chair to support your upper back. Your low back will be in a neutral position, just slightly arched. You may place a small pillow or folded towel at the base of your low back for support.  When  working at a desk, create an environment that supports good, upright posture. Without extra support, muscles fatigue and lead to excessive strain on joints and other tissues. Keep these recommendations in mind: CHAIR:   A chair should be able to slide under your desk when your back makes contact with the back of the chair. This allows you to work closely.  The chair's height should allow your eyes to be level with the upper part of your monitor and your hands to be slightly lower than your elbows. BODY POSITION  Your feet should make contact with the floor. If this is not possible, use a foot rest.  Keep your ears over your shoulders. This will reduce stress on your neck and low back. INCORRECT SITTING POSTURES   If you are feeling tired and unable to assume a healthy sitting posture, do not slouch or slump. This puts excessive strain on your back tissues, causing more damage and pain. Healthier options include:  Using more support, like a lumbar pillow.  Switching tasks to something that requires you to be upright or walking.  Talking a brief walk.  Lying down to rest in a neutral-spine position. PROLONGED STANDING WHILE SLIGHTLY LEANING FORWARD  When completing a task that requires you to lean forward while standing in one place for a long time, place either foot up on a stationary 2-4 inch high object to help maintain the best posture. When both feet are on the ground, the low back tends to lose its slight inward curve. If this curve flattens (or becomes too large), then the back and your other joints will experience too much stress, fatigue more quickly and can cause pain.  CORRECT STANDING POSTURES Proper standing posture should be assumed with all daily activities, even if they only take a few moments, like when brushing your teeth. As in sitting, your ears should fall over your shoulders and your shoulders should fall over your hips. You should keep a slight tension in your abdominal  muscles to brace your spine. Your tailbone should point down to the ground, not behind your body, resulting in an over-extended swayback posture.  INCORRECT STANDING POSTURES  Common incorrect standing postures include a forward head, locked knees and/or an excessive swayback. WALKING Walk with an upright posture. Your ears, shoulders and hips should all line-up. PROLONGED ACTIVITY IN A FLEXED POSITION When completing a task that requires you to bend forward   at your waist or lean over a low surface, try to find a way to stabilize 3 of 4 of your limbs. You can place a hand or elbow on your thigh or rest a knee on the surface you are reaching across. This will provide you more stability so that your muscles do not fatigue as quickly. By keeping your knees relaxed, or slightly bent, you will also reduce stress across your low back. CORRECT LIFTING TECHNIQUES DO :   Assume a wide stance. This will provide you more stability and the opportunity to get as close as possible to the object which you are lifting.  Tense your abdominals to brace your spine; then bend at the knees and hips. Keeping your back locked in a neutral-spine position, lift using your leg muscles. Lift with your legs, keeping your back straight.  Test the weight of unknown objects before attempting to lift them.  Try to keep your elbows locked down at your sides in order get the best strength from your shoulders when carrying an object.  Always ask for help when lifting heavy or awkward objects. INCORRECT LIFTING TECHNIQUES DO NOT:   Lock your knees when lifting, even if it is a small object.  Bend and twist. Pivot at your feet or move your feet when needing to change directions.  Assume that you cannot safely pick up a paperclip without proper posture. Document Released: 11/06/2005 Document Revised: 03/23/2014 Document Reviewed: 02/18/2009 [employer] Patient Information 2015 [employer]. This information is not intended to  replace advice given to you by your health care provider. Make sure you discuss any questions you have with your health care provider.  

## 2014-08-12 NOTE — Progress Notes (Signed)
Subjective:    Patient ID: Eileen Perez, female    DOB: [birth year], 73 y.o.   MRN: [id number]  HPI Comments: 73 yo AAF CPE and presents for 3 month F/U for HTN, Cholesterol, Pre-Dm, D. deficient . She notes BP <140 at home. She has f/u 08/20/14 with Nephrology for chronic renal disease. She has not been exercising as much but eats healthy. She increased her Allopurinol to whole tablet 1 month ago but does not recall why. She denies any recent gout flare. She does not chronic low back pain radiating into back of right leg. She also notes her right leg is shorter than left and OTC shoe insert and cain are not helping with pain with walking. She denies any recent falls. She takes Tylenol 650 mg which helps some with pain.   She is otherwise doing well without concerns.   WBC             5.4   04/16/2014 HGB            11.2   07/17/2014 HCT            35.4   04/16/2014 PLT             224   04/16/2014 GLUCOSE          90   06/19/2014 CHOL            212   04/16/2014 TRIG             62   04/16/2014 HDL              74   04/16/2014 LDLCALC         126   04/16/2014 ALT              12   04/16/2014 AST              17   04/16/2014 NA              142   06/19/2014 K               5.1   06/19/2014 CL              106   06/19/2014 CREATININE     2.90   06/19/2014 BUN              60   06/19/2014 CO2              21   06/19/2014 TSH           1.871   04/16/2014 INR            0.99   06/02/2013 HGBA1C          5.9   04/16/2014   Anemia  Hyperlipidemia Pertinent negatives include no chest pain or shortness of breath.  Hypertension Pertinent negatives include no chest pain or shortness of breath.     Medication List       This list is accurate as of: 08/12/14 11:59 PM.  Always use your most recent med list.               ACCU-CHEK AVIVA PLUS test strip  Generic drug:  glucose blood  Test once daily     allopurinol 300 MG tablet  Commonly known as:  ZYLOPRIM  Take 1/2 to 1 tab daily as directed.      aspirin 81 MG tablet  1/2 tab po qd  BOOSTRIX 5-2.5-18.5 LF-MCG/0.5 injection  Generic drug:  Tdap     Fish Oil 1000 MG Caps  Take 1,000 mg by mouth.     FLAXSEED OIL PO  Take 1,000 mg by mouth daily.     HM VITAMIN D3 4000 UNITS Caps  Generic drug:  Cholecalciferol  Take 4,000 Int'l Units by mouth.     hydrochlorothiazide 25 MG tablet  Commonly known as:  HYDRODIURIL  Take 1 tablet (25 mg total) by mouth every morning. for BP and fluid     labetalol 200 MG tablet  Commonly known as:  NORMODYNE  Take 1 tablet (200 mg total) by mouth 2 (two) times daily.     multivitamin with minerals tablet  Take 1 tablet by mouth daily.     NON FORMULARY  every 6 (six) weeks. Iron Injection     RED YEAST RICE PO  Take 1,200 tablets by mouth daily.     SLOW FE PO  Take 45 mg by mouth 2 (two) times daily.     WELCHOL 3.75 G Pack  Generic drug:  Colesevelam HCl  Take 3.75 each by mouth daily.       Allergies  Allergen Reactions  . Ace Inhibitors   . Nsaids    Past Medical History  Diagnosis Date  . Diabetes mellitus   . Hypertension   . Arthritis     Osteoarthritis  . Anemia   . History of blood transfusion   . Chronic kidney disease     Chronic renal insufficiency  . Pancreatic cyst 1999  . Thyroid disease     Hyperparathyroid   . GERD (gastroesophageal reflux disease)   . Gout   . Osteopenia   . Diverticulosis   . Hiatal hernia   . Hyperlipidemia    Past Surgical History  Procedure Laterality Date  . Pancreatic cyst excision  1999  . Knee surgery    . Joint replacement  2012    left knee  . Eye surgery Left     cataract extraction with IOL  . Cholecystectomy    . 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 Colonoscopy  03/21/12    Next one in 2018    History  Substance Use Topics  . Smoking status: Never  Smoker   . Smokeless tobacco: Never Used  . Alcohol Use: No   Family History  Problem Relation Age of Onset  . Hypertension Mother   . Cancer Mother     Colon with METS  . Diabetes Mother   . Cancer Father     Prostate and Throat    Screening Tests  Health Maintenance   Topic  Date Due   .  Pneumococcal Polysaccharide Vaccine Age 60 And Over  01/08/2005   .  Tetanus/tdap  11/20/2013   .  Influenza Vaccine  06/20/2014   .  Mammogram  09/16/2015   .  Colonoscopy  09/16/2023   .  Zostavax  Completed    Immunization History   Administered  Date(s) Administered   .  DT  02/05/2014   .  Influenza, High Dose Seasonal PF  09/25/2013   .  Pneumococcal Conjugate-13  11/20/2009   .  Td  11/21/2003   .  Zoster  11/20/2005    Preventative care:  Last colonoscopy: 03/21/12  Last mammogram: 05/01/12  Last pap  smear/pelvic exam: 2011 WNL  DEXA:08/02/12 osteopenia  EYE: 11/21/12 WNL  Prior vaccinations:  TD or Tdap: 02/05/14  Influenza: 09/25/13  Pneumococcal: 11/20/09  Shingles/Zostavax: 1/1/7  Patient Care Team: Unk Pinto, MD as PCP - General (Internal Medicine) Gearlean Alf, MD as Consulting Physician (Orthopedic Surgery) Louis Meckel, MD as Consulting Physician (Nephrology) Johna Sheriff, MD as Consulting Physician (Ophthalmology) Winfield Cunas., MD as Consulting Physician (Gastroenterology)   Review of Systems  Constitutional: Positive for fatigue.  Respiratory: Negative for shortness of breath.   Cardiovascular: Negative for chest pain.  Musculoskeletal: Positive for back pain and gait problem.  All other systems reviewed and are negative.  BP 186/102  Pulse 64  Temp(Src) 98 F (36.7 C) (Temporal)  Resp 16  Ht 5' 1.5" (1.562 m)  Wt 134 lb (60.782 kg)  BMI 24.91 kg/m2 REcheck 165/85     Objective:   Physical Exam  Nursing note and vitals reviewed. Constitutional: She is oriented to person, place, and time. She appears well-developed and  well-nourished. No distress.  HENT:  Head: Normocephalic and atraumatic.  Right Ear: External ear normal.  Left Ear: External ear normal.  Nose: Nose normal.  Mouth/Throat: Oropharynx is clear and moist.  Eyes: Conjunctivae and EOM are normal. Pupils are equal, round, and reactive to light. Right eye exhibits no discharge. Left eye exhibits no discharge. No scleral icterus.  Neck: Normal range of motion. Neck supple. No JVD present. No tracheal deviation present. No thyromegaly present.  Cardiovascular: Normal rate, regular rhythm, normal heart sounds and intact distal pulses.   Pulmonary/Chest: Effort normal and breath sounds normal.  Abdominal: Soft. Bowel sounds are normal. She exhibits no distension and no mass. There is no tenderness. There is no rebound and no guarding.  Genitourinary:  Breast- WNL GYN-Deferred  Musculoskeletal: Normal range of motion. She exhibits tenderness. She exhibits no edema.  Right sciatica  Lymphadenopathy:    She has no cervical adenopathy.  Neurological: She is alert and oriented to person, place, and time. No cranial nerve deficit. She exhibits normal muscle tone. Coordination normal.  Right DTR <Left  Skin: Skin is warm and dry. No rash noted. No erythema. No pallor.  Psychiatric: She has a normal mood and affect. Her behavior is normal. Judgment and thought content normal.     AORTA SCAN WNL x1 scan and 1 abnormal scan EKG NSCSPT      Assessment & Plan:  1. CPE- Update screening labs/ History/ Immunizations/ Testing as needed. Advised healthy diet, QD exercise, increase H20 and continue RX/ Vitamins AD.   2.  3 month F/U for HTN, Cholesterol, Pre-Dm, D. Deficient. Needs healthy diet, cardio QD and obtain healthy weight. Check Labs, Check BP if >130/80 call office   3. Chronic renal disease with Anemia- Recheck labs, take Vitamins AD increase green leafy veggies, increase H2O, w/c if any symptom increase.   4. Gout with renal disease advise to  decrease Allopurinol back to 1/2 tab and recheck lab  5. Right leg pain with history of leg length discrepency/ back pain/ elevated Alk Phos HX- Refer Ortho for further evaluation and possible need for shoe orthotics  6. ? Aorta scan abnormality- get U/S to re-evaluate

## 2014-08-13 LAB — VITAMIN D 25 HYDROXY (VIT D DEFICIENCY, FRACTURES): Vit D, 25-Hydroxy: 49 ng/mL (ref 30–89)

## 2014-08-13 LAB — IRON AND TIBC
%SAT: 43 % (ref 20–55)
IRON: 100 ug/dL (ref 42–145)
TIBC: 232 ug/dL — ABNORMAL LOW (ref 250–470)
UIBC: 132 ug/dL (ref 125–400)

## 2014-08-13 LAB — BASIC METABOLIC PANEL WITH GFR
BUN: 53 mg/dL — ABNORMAL HIGH (ref 6–23)
CALCIUM: 10.4 mg/dL (ref 8.4–10.5)
CO2: 25 mEq/L (ref 19–32)
Chloride: 106 mEq/L (ref 96–112)
Creat: 2.66 mg/dL — ABNORMAL HIGH (ref 0.50–1.10)
GFR, EST AFRICAN AMERICAN: 20 mL/min — AB
GFR, EST NON AFRICAN AMERICAN: 17 mL/min — AB
Glucose, Bld: 93 mg/dL (ref 70–99)
Potassium: 4.6 mEq/L (ref 3.5–5.3)
SODIUM: 139 meq/L (ref 135–145)

## 2014-08-13 LAB — HEPATIC FUNCTION PANEL
ALK PHOS: 158 U/L — AB (ref 39–117)
ALT: 16 U/L (ref 0–35)
AST: 21 U/L (ref 0–37)
Albumin: 3.8 g/dL (ref 3.5–5.2)
BILIRUBIN INDIRECT: 0.6 mg/dL (ref 0.2–1.2)
Bilirubin, Direct: 0.1 mg/dL (ref 0.0–0.3)
TOTAL PROTEIN: 6.9 g/dL (ref 6.0–8.3)
Total Bilirubin: 0.7 mg/dL (ref 0.2–1.2)

## 2014-08-13 LAB — URINALYSIS, ROUTINE W REFLEX MICROSCOPIC
BILIRUBIN URINE: NEGATIVE
GLUCOSE, UA: NEGATIVE mg/dL
Ketones, ur: NEGATIVE mg/dL
LEUKOCYTES UA: NEGATIVE
Nitrite: NEGATIVE
PH: 5.5 (ref 5.0–8.0)
Protein, ur: 100 mg/dL — AB
Specific Gravity, Urine: 1.009 (ref 1.005–1.030)
Urobilinogen, UA: 0.2 mg/dL (ref 0.0–1.0)

## 2014-08-13 LAB — LIPID PANEL
CHOL/HDL RATIO: 2.9 ratio
Cholesterol: 220 mg/dL — ABNORMAL HIGH (ref 0–200)
HDL: 75 mg/dL (ref >39)
LDL CALC: 124 mg/dL — AB (ref 0–99)
TRIGLYCERIDES: 103 mg/dL (ref <150)
VLDL: 21 mg/dL (ref 0–40)

## 2014-08-13 LAB — URINALYSIS, MICROSCOPIC ONLY
Bacteria, UA: NONE SEEN
CRYSTALS: NONE SEEN
Casts: NONE SEEN
Squamous Epithelial / LPF: NONE SEEN

## 2014-08-13 LAB — MAGNESIUM: Magnesium: 1.8 mg/dL (ref 1.5–2.5)

## 2014-08-13 LAB — HEMOGLOBIN A1C
HEMOGLOBIN A1C: 6.1 % — AB (ref <5.7)
Mean Plasma Glucose: 128 mg/dL — ABNORMAL HIGH (ref <117)

## 2014-08-13 LAB — URIC ACID: Uric Acid, Serum: 5.5 mg/dL (ref 2.4–7.0)

## 2014-08-13 LAB — INSULIN, FASTING: INSULIN FASTING, SERUM: 3.2 u[IU]/mL (ref 2.0–19.6)

## 2014-08-13 LAB — MICROALBUMIN / CREATININE URINE RATIO
CREATININE, URINE: 48.9 mg/dL
MICROALB/CREAT RATIO: 2180 mg/g — AB (ref 0.0–30.0)
Microalb, Ur: 106.6 mg/dL — ABNORMAL HIGH (ref <2.0)

## 2014-08-13 LAB — TSH: TSH: 3.597 u[IU]/mL (ref 0.350–4.500)

## 2014-08-13 LAB — VITAMIN B12: VITAMIN B 12: 708 pg/mL (ref 211–911)

## 2014-08-13 LAB — FOLATE RBC: RBC Folate: 828 ng/mL (ref >280)

## 2014-08-14 ENCOUNTER — Ambulatory Visit (HOSPITAL_COMMUNITY)
Admission: RE | Admit: 2014-08-14 | Discharge: 2014-08-14 | Disposition: A | Discharge status: Home / Self Care | Payer: Medicare Other | Source: Ambulatory Visit | Attending: Nephrology | Admitting: Nephrology

## 2014-08-14 ENCOUNTER — Telehealth: Payer: Self-pay

## 2014-08-14 DIAGNOSIS — N184 Chronic kidney disease, stage 4 (severe): Secondary | ICD-10-CM | POA: Insufficient documentation

## 2014-08-14 DIAGNOSIS — I129 Hypertensive chronic kidney disease with stage 1 through stage 4 chronic kidney disease, or unspecified chronic kidney disease: Secondary | ICD-10-CM | POA: Diagnosis present

## 2014-08-14 DIAGNOSIS — N039 Chronic nephritic syndrome with unspecified morphologic changes: Principal | ICD-10-CM

## 2014-08-14 DIAGNOSIS — D631 Anemia in chronic kidney disease: Secondary | ICD-10-CM | POA: Diagnosis present

## 2014-08-14 LAB — FERRITIN: FERRITIN: 730 ng/mL — AB (ref 10–291)

## 2014-08-14 LAB — RENAL FUNCTION PANEL
ANION GAP: 12 (ref 5–15)
Albumin: 3.5 g/dL (ref 3.5–5.2)
BUN: 59 mg/dL — ABNORMAL HIGH (ref 6–23)
CHLORIDE: 104 meq/L (ref 96–112)
CO2: 23 meq/L (ref 19–32)
CREATININE: 2.83 mg/dL — AB (ref 0.50–1.10)
Calcium: 10.4 mg/dL (ref 8.4–10.5)
GFR, EST AFRICAN AMERICAN: 18 mL/min — AB (ref >90)
GFR, EST NON AFRICAN AMERICAN: 15 mL/min — AB (ref >90)
Glucose, Bld: 104 mg/dL — ABNORMAL HIGH (ref 70–99)
POTASSIUM: 5 meq/L (ref 3.7–5.3)
Phosphorus: 3.9 mg/dL (ref 2.3–4.6)
Sodium: 139 mEq/L (ref 137–147)

## 2014-08-14 LAB — IRON AND TIBC
IRON: 106 ug/dL (ref 42–135)
Saturation Ratios: 48 % (ref 20–55)
TIBC: 221 ug/dL — ABNORMAL LOW (ref 250–470)
UIBC: 115 ug/dL — ABNORMAL LOW (ref 125–400)

## 2014-08-14 LAB — POCT HEMOGLOBIN-HEMACUE: Hemoglobin: 11.4 g/dL — ABNORMAL LOW (ref 12.0–15.0)

## 2014-08-14 MED ORDER — EPOETIN ALFA 20000 UNIT/ML IJ SOLN
20000.0000 [IU] | INTRAMUSCULAR | Status: DC
  Administered 2014-08-14: 20000 [IU] via SUBCUTANEOUS

## 2014-08-14 MED ORDER — EPOETIN ALFA 20000 UNIT/ML IJ SOLN
INTRAMUSCULAR | Status: AC
  Administered 2014-08-14: 20000 [IU] via SUBCUTANEOUS
  Filled 2014-08-14: qty 1

## 2014-08-14 NOTE — Telephone Encounter (Signed)
Left message for patient to return call for lab results. 

## 2014-08-14 NOTE — Telephone Encounter (Signed)
Message copied by Nadyne Coombes on Fri Aug 14, 2014 11:55 AM ------      Message from: Kelby Aline R      Created: Thu Aug 13, 2014  1:50 PM       Cholesterol is not in range, need to eat healthy diet, exercise daily. [place]/ BUN/ Creat (kidney functions) elevated but mildly improved. A1c (3 month blood sugar average) elevated need decreased Carbohydrates, increase Cardio, and maintain healthy weight. Trace of blood with urine we will call if any infection grows, otherwise try to increase water and decrease Allopurinol to 1/2 tab. Needs recheck 3 month Office VISIT MCK                    ------

## 2014-08-15 LAB — URINE CULTURE: Colony Count: 2000

## 2014-08-17 LAB — PTH, INTACT AND CALCIUM
CALCIUM TOTAL (PTH): 10.2 mg/dL (ref 8.4–10.5)
PTH: 400 pg/mL — AB (ref 14–64)

## 2014-08-19 ENCOUNTER — Telehealth: Payer: Self-pay | Admitting: *Deleted

## 2014-08-19 NOTE — Telephone Encounter (Signed)
Discussed lab results from 08/12/14 with patient.

## 2014-08-21 ENCOUNTER — Telehealth: Payer: Self-pay

## 2014-08-21 ENCOUNTER — Ambulatory Visit
Admission: RE | Admit: 2014-08-21 | Discharge: 2014-08-21 | Disposition: A | Discharge status: Home / Self Care | Payer: Medicare Other | Source: Ambulatory Visit | Attending: Emergency Medicine | Admitting: Emergency Medicine

## 2014-08-21 DIAGNOSIS — R6889 Other general symptoms and signs: Secondary | ICD-10-CM

## 2014-08-21 DIAGNOSIS — I10 Essential (primary) hypertension: Secondary | ICD-10-CM

## 2014-08-21 NOTE — Telephone Encounter (Signed)
Message copied by Nadyne Coombes on Fri Aug 21, 2014 10:17 AM ------      Message from: Kelby Aline R      Created: Thu Aug 13, 2014  1:50 PM       Cholesterol is not in range, need to eat healthy diet, exercise daily. [place]/ BUN/ Creat (kidney functions) elevated but mildly improved. A1c (3 month blood sugar average) elevated need decreased Carbohydrates, increase Cardio, and maintain healthy weight. Trace of blood with urine we will call if any infection grows, otherwise try to increase water and decrease Allopurinol to 1/2 tab. Needs recheck 3 month Office VISIT MCK                    ------

## 2014-08-21 NOTE — Telephone Encounter (Signed)
Left message for patient to return my call for lab results. 

## 2014-08-21 NOTE — Addendum Note (Signed)
Addended by: Karrah Mangini A on: 08/21/2014 06:12 PM   Modules accepted: Orders

## 2014-08-23 ENCOUNTER — Other Ambulatory Visit: Payer: Self-pay | Admitting: Internal Medicine

## 2014-08-27 ENCOUNTER — Other Ambulatory Visit: Payer: Self-pay

## 2014-08-27 DIAGNOSIS — R6889 Other general symptoms and signs: Secondary | ICD-10-CM

## 2014-08-31 ENCOUNTER — Other Ambulatory Visit: Payer: Medicare Other

## 2014-09-04 ENCOUNTER — Other Ambulatory Visit: Payer: Self-pay | Admitting: Internal Medicine

## 2014-09-04 ENCOUNTER — Ambulatory Visit
Admission: RE | Admit: 2014-09-04 | Discharge: 2014-09-04 | Disposition: A | Discharge status: Home / Self Care | Payer: Medicare Other | Source: Ambulatory Visit | Attending: Internal Medicine | Admitting: Internal Medicine

## 2014-09-04 DIAGNOSIS — D49519 Neoplasm of unspecified behavior of unspecified kidney: Secondary | ICD-10-CM

## 2014-09-04 DIAGNOSIS — R6889 Other general symptoms and signs: Secondary | ICD-10-CM

## 2014-09-07 ENCOUNTER — Other Ambulatory Visit: Payer: Self-pay | Admitting: *Deleted

## 2014-09-07 DIAGNOSIS — N281 Cyst of kidney, acquired: Secondary | ICD-10-CM

## 2014-09-07 DIAGNOSIS — N184 Chronic kidney disease, stage 4 (severe): Secondary | ICD-10-CM

## 2014-09-11 ENCOUNTER — Encounter (HOSPITAL_COMMUNITY)
Admission: RE | Admit: 2014-09-11 | Discharge: 2014-09-11 | Disposition: A | Discharge status: Home / Self Care | Payer: Medicare Other | Source: Ambulatory Visit | Attending: Nephrology | Admitting: Nephrology

## 2014-09-11 DIAGNOSIS — N183 Chronic kidney disease, stage 3 (moderate): Secondary | ICD-10-CM | POA: Insufficient documentation

## 2014-09-11 DIAGNOSIS — D631 Anemia in chronic kidney disease: Secondary | ICD-10-CM | POA: Insufficient documentation

## 2014-09-11 LAB — RENAL FUNCTION PANEL
ANION GAP: 13 (ref 5–15)
Albumin: 3.5 g/dL (ref 3.5–5.2)
BUN: 68 mg/dL — ABNORMAL HIGH (ref 6–23)
CHLORIDE: 107 meq/L (ref 96–112)
CO2: 23 meq/L (ref 19–32)
Calcium: 10.4 mg/dL (ref 8.4–10.5)
Creatinine, Ser: 3.08 mg/dL — ABNORMAL HIGH (ref 0.50–1.10)
GFR calc Af Amer: 16 mL/min — ABNORMAL LOW (ref >90)
GFR, EST NON AFRICAN AMERICAN: 14 mL/min — AB (ref >90)
GLUCOSE: 94 mg/dL (ref 70–99)
POTASSIUM: 4.8 meq/L (ref 3.7–5.3)
Phosphorus: 3.7 mg/dL (ref 2.3–4.6)
Sodium: 143 mEq/L (ref 137–147)

## 2014-09-11 LAB — IRON AND TIBC
Iron: 103 ug/dL (ref 42–135)
Saturation Ratios: 44 % (ref 20–55)
TIBC: 232 ug/dL — AB (ref 250–470)
UIBC: 129 ug/dL (ref 125–400)

## 2014-09-11 LAB — FERRITIN: FERRITIN: 990 ng/mL — AB (ref 10–291)

## 2014-09-11 LAB — POCT HEMOGLOBIN-HEMACUE: HEMOGLOBIN: 11.4 g/dL — AB (ref 12.0–15.0)

## 2014-09-11 MED ORDER — EPOETIN ALFA 20000 UNIT/ML IJ SOLN: 20000.0000 [IU] | INTRAMUSCULAR | Status: DC

## 2014-09-11 MED ORDER — EPOETIN ALFA 20000 UNIT/ML IJ SOLN
INTRAMUSCULAR | Status: AC
  Administered 2014-09-11: 20000 [IU] via SUBCUTANEOUS
  Filled 2014-09-11: qty 1

## 2014-09-14 LAB — PTH, INTACT AND CALCIUM
CALCIUM TOTAL (PTH): 10.2 mg/dL (ref 8.4–10.5)
PTH: 318 pg/mL — AB (ref 14–64)

## 2014-10-08 ENCOUNTER — Other Ambulatory Visit (HOSPITAL_COMMUNITY): Payer: Self-pay | Admitting: *Deleted

## 2014-10-09 ENCOUNTER — Ambulatory Visit (HOSPITAL_COMMUNITY)
Admission: RE | Admit: 2014-10-09 | Discharge: 2014-10-09 | Disposition: A | Discharge status: Home / Self Care | Payer: Medicare Other | Source: Ambulatory Visit | Attending: Nephrology | Admitting: Nephrology

## 2014-10-09 DIAGNOSIS — D638 Anemia in other chronic diseases classified elsewhere: Secondary | ICD-10-CM | POA: Diagnosis not present

## 2014-10-09 DIAGNOSIS — N183 Chronic kidney disease, stage 3 (moderate): Secondary | ICD-10-CM | POA: Diagnosis not present

## 2014-10-09 LAB — IRON AND TIBC
IRON: 100 ug/dL (ref 42–135)
SATURATION RATIOS: 44 % (ref 20–55)
TIBC: 225 ug/dL — ABNORMAL LOW (ref 250–470)
UIBC: 125 ug/dL (ref 125–400)

## 2014-10-09 LAB — RENAL FUNCTION PANEL
ALBUMIN: 3.2 g/dL — AB (ref 3.5–5.2)
Anion gap: 12 (ref 5–15)
BUN: 59 mg/dL — AB (ref 6–23)
CHLORIDE: 109 meq/L (ref 96–112)
CO2: 20 mEq/L (ref 19–32)
Calcium: 10.2 mg/dL (ref 8.4–10.5)
Creatinine, Ser: 3.05 mg/dL — ABNORMAL HIGH (ref 0.50–1.10)
GFR calc Af Amer: 16 mL/min — ABNORMAL LOW (ref >90)
GFR, EST NON AFRICAN AMERICAN: 14 mL/min — AB (ref >90)
Glucose, Bld: 132 mg/dL — ABNORMAL HIGH (ref 70–99)
PHOSPHORUS: 3.5 mg/dL (ref 2.3–4.6)
POTASSIUM: 5.1 meq/L (ref 3.7–5.3)
Sodium: 141 mEq/L (ref 137–147)

## 2014-10-09 LAB — FERRITIN: FERRITIN: 688 ng/mL — AB (ref 10–291)

## 2014-10-09 LAB — POCT HEMOGLOBIN-HEMACUE: Hemoglobin: 10.7 g/dL — ABNORMAL LOW (ref 12.0–15.0)

## 2014-10-09 MED ORDER — EPOETIN ALFA 20000 UNIT/ML IJ SOLN
20000.0000 [IU] | INTRAMUSCULAR | Status: DC
  Administered 2014-10-09: 20000 [IU] via SUBCUTANEOUS

## 2014-10-09 MED ORDER — EPOETIN ALFA 20000 UNIT/ML IJ SOLN
INTRAMUSCULAR | Status: AC
  Filled 2014-10-09: qty 1

## 2014-10-12 LAB — PTH, INTACT AND CALCIUM
Calcium, Total (PTH): 9.9 mg/dL (ref 8.4–10.5)
PTH: 325 pg/mL — ABNORMAL HIGH (ref 14–64)

## 2014-10-23 ENCOUNTER — Other Ambulatory Visit (HOSPITAL_COMMUNITY): Payer: Self-pay | Admitting: Urology

## 2014-10-23 DIAGNOSIS — N281 Cyst of kidney, acquired: Secondary | ICD-10-CM

## 2014-10-29 ENCOUNTER — Other Ambulatory Visit: Payer: Self-pay | Admitting: *Deleted

## 2014-10-29 MED ORDER — LABETALOL HCL 200 MG PO TABS: 200.0000 mg | ORAL_TABLET | Freq: Two times a day (BID) | ORAL | Status: DC

## 2014-11-06 ENCOUNTER — Encounter (HOSPITAL_COMMUNITY)
Admission: RE | Admit: 2014-11-06 | Discharge: 2014-11-06 | Disposition: A | Discharge status: Home / Self Care | Payer: Medicare Other | Source: Ambulatory Visit | Attending: Nephrology | Admitting: Nephrology

## 2014-11-06 DIAGNOSIS — D631 Anemia in chronic kidney disease: Secondary | ICD-10-CM | POA: Insufficient documentation

## 2014-11-06 DIAGNOSIS — N183 Chronic kidney disease, stage 3 (moderate): Secondary | ICD-10-CM | POA: Insufficient documentation

## 2014-11-06 LAB — RENAL FUNCTION PANEL
Albumin: 3.1 g/dL — ABNORMAL LOW (ref 3.5–5.2)
Anion gap: 13 (ref 5–15)
BUN: 62 mg/dL — ABNORMAL HIGH (ref 6–23)
CALCIUM: 10.1 mg/dL (ref 8.4–10.5)
CO2: 22 mEq/L (ref 19–32)
CREATININE: 3.04 mg/dL — AB (ref 0.50–1.10)
Chloride: 109 mEq/L (ref 96–112)
GFR calc Af Amer: 16 mL/min — ABNORMAL LOW (ref >90)
GFR, EST NON AFRICAN AMERICAN: 14 mL/min — AB (ref >90)
Glucose, Bld: 145 mg/dL — ABNORMAL HIGH (ref 70–99)
Phosphorus: 3.7 mg/dL (ref 2.3–4.6)
Potassium: 5 mEq/L (ref 3.7–5.3)
SODIUM: 144 meq/L (ref 137–147)

## 2014-11-06 LAB — IRON AND TIBC
IRON: 101 ug/dL (ref 42–135)
Saturation Ratios: 47 % (ref 20–55)
TIBC: 213 ug/dL — AB (ref 250–470)
UIBC: 112 ug/dL — AB (ref 125–400)

## 2014-11-06 LAB — POCT HEMOGLOBIN-HEMACUE: Hemoglobin: 10.7 g/dL — ABNORMAL LOW (ref 12.0–15.0)

## 2014-11-06 LAB — FERRITIN: FERRITIN: 661 ng/mL — AB (ref 10–291)

## 2014-11-06 MED ORDER — EPOETIN ALFA 20000 UNIT/ML IJ SOLN
INTRAMUSCULAR | Status: AC
  Filled 2014-11-06: qty 1

## 2014-11-06 MED ORDER — EPOETIN ALFA 20000 UNIT/ML IJ SOLN
20000.0000 [IU] | INTRAMUSCULAR | Status: DC
  Administered 2014-11-06: 20000 [IU] via SUBCUTANEOUS

## 2014-11-09 ENCOUNTER — Ambulatory Visit (HOSPITAL_COMMUNITY)
Admission: RE | Admit: 2014-11-09 | Discharge: 2014-11-09 | Disposition: A | Discharge status: Home / Self Care | Payer: Medicare Other | Source: Ambulatory Visit | Attending: Urology | Admitting: Urology

## 2014-11-09 DIAGNOSIS — Q6100 Congenital renal cyst, unspecified: Secondary | ICD-10-CM | POA: Diagnosis present

## 2014-11-09 DIAGNOSIS — N281 Cyst of kidney, acquired: Secondary | ICD-10-CM

## 2014-11-09 LAB — PTH, INTACT AND CALCIUM
CALCIUM TOTAL (PTH): 9.7 mg/dL (ref 8.4–10.5)
PTH: 415 pg/mL — ABNORMAL HIGH (ref 14–64)

## 2014-11-23 DIAGNOSIS — Q6102 Congenital multiple renal cysts: Secondary | ICD-10-CM | POA: Diagnosis not present

## 2014-12-04 ENCOUNTER — Encounter (HOSPITAL_COMMUNITY): Payer: Medicare Other

## 2014-12-18 ENCOUNTER — Encounter (HOSPITAL_COMMUNITY)
Admission: RE | Admit: 2014-12-18 | Discharge: 2014-12-18 | Disposition: A | Discharge status: Home / Self Care | Payer: Medicare Other | Source: Ambulatory Visit | Attending: Nephrology | Admitting: Nephrology

## 2014-12-18 DIAGNOSIS — D631 Anemia in chronic kidney disease: Secondary | ICD-10-CM | POA: Diagnosis not present

## 2014-12-18 DIAGNOSIS — N183 Chronic kidney disease, stage 3 (moderate): Secondary | ICD-10-CM | POA: Diagnosis not present

## 2014-12-18 LAB — IRON AND TIBC
Iron: 89 ug/dL (ref 42–145)
Saturation Ratios: 38 % (ref 20–55)
TIBC: 232 ug/dL — ABNORMAL LOW (ref 250–470)
UIBC: 143 ug/dL (ref 125–400)

## 2014-12-18 LAB — RENAL FUNCTION PANEL
Albumin: 3.5 g/dL (ref 3.5–5.2)
Anion gap: 4 — ABNORMAL LOW (ref 5–15)
BUN: 67 mg/dL — ABNORMAL HIGH (ref 6–23)
CO2: 26 mmol/L (ref 19–32)
CREATININE: 3.77 mg/dL — AB (ref 0.50–1.10)
Calcium: 10.2 mg/dL (ref 8.4–10.5)
Chloride: 110 mmol/L (ref 96–112)
GFR calc non Af Amer: 11 mL/min — ABNORMAL LOW (ref >90)
GFR, EST AFRICAN AMERICAN: 13 mL/min — AB (ref >90)
GLUCOSE: 102 mg/dL — AB (ref 70–99)
PHOSPHORUS: 4.4 mg/dL (ref 2.3–4.6)
Potassium: 4.6 mmol/L (ref 3.5–5.1)
SODIUM: 140 mmol/L (ref 135–145)

## 2014-12-18 LAB — FERRITIN: Ferritin: 781 ng/mL — ABNORMAL HIGH (ref 10–291)

## 2014-12-18 LAB — POCT HEMOGLOBIN-HEMACUE: Hemoglobin: 10.8 g/dL — ABNORMAL LOW (ref 12.0–15.0)

## 2014-12-18 MED ORDER — EPOETIN ALFA 20000 UNIT/ML IJ SOLN
INTRAMUSCULAR | Status: AC
  Administered 2014-12-18: 20000 [IU] via SUBCUTANEOUS
  Filled 2014-12-18: qty 1

## 2014-12-18 MED ORDER — EPOETIN ALFA 20000 UNIT/ML IJ SOLN
20000.0000 [IU] | INTRAMUSCULAR | Status: DC
  Administered 2014-12-18: 20000 [IU] via SUBCUTANEOUS

## 2014-12-19 LAB — PTH, INTACT AND CALCIUM
CALCIUM TOTAL (PTH): 9.9 mg/dL (ref 8.7–10.3)
PTH: 276 pg/mL — ABNORMAL HIGH (ref 15–65)

## 2014-12-23 DIAGNOSIS — I129 Hypertensive chronic kidney disease with stage 1 through stage 4 chronic kidney disease, or unspecified chronic kidney disease: Secondary | ICD-10-CM | POA: Diagnosis not present

## 2014-12-23 DIAGNOSIS — D638 Anemia in other chronic diseases classified elsewhere: Secondary | ICD-10-CM | POA: Diagnosis not present

## 2014-12-23 DIAGNOSIS — N184 Chronic kidney disease, stage 4 (severe): Secondary | ICD-10-CM | POA: Diagnosis not present

## 2014-12-23 DIAGNOSIS — E785 Hyperlipidemia, unspecified: Secondary | ICD-10-CM | POA: Diagnosis not present

## 2015-01-09 ENCOUNTER — Encounter: Payer: Self-pay | Admitting: *Deleted

## 2015-01-14 ENCOUNTER — Inpatient Hospital Stay (HOSPITAL_COMMUNITY): Admission: RE | Admit: 2015-01-14 | Payer: Self-pay | Source: Ambulatory Visit

## 2015-01-15 ENCOUNTER — Encounter (HOSPITAL_COMMUNITY)
Admission: RE | Admit: 2015-01-15 | Discharge: 2015-01-15 | Disposition: A | Discharge status: Home / Self Care | Payer: Medicare Other | Source: Ambulatory Visit | Attending: Nephrology | Admitting: Nephrology

## 2015-01-15 DIAGNOSIS — N183 Chronic kidney disease, stage 3 (moderate): Secondary | ICD-10-CM | POA: Diagnosis not present

## 2015-01-15 DIAGNOSIS — D631 Anemia in chronic kidney disease: Secondary | ICD-10-CM | POA: Insufficient documentation

## 2015-01-15 LAB — RENAL FUNCTION PANEL
ANION GAP: 8 (ref 5–15)
Albumin: 3.6 g/dL (ref 3.5–5.2)
BUN: 57 mg/dL — ABNORMAL HIGH (ref 6–23)
CO2: 22 mmol/L (ref 19–32)
Calcium: 9.8 mg/dL (ref 8.4–10.5)
Chloride: 109 mmol/L (ref 96–112)
Creatinine, Ser: 3.53 mg/dL — ABNORMAL HIGH (ref 0.50–1.10)
GFR calc non Af Amer: 12 mL/min — ABNORMAL LOW (ref >90)
GFR, EST AFRICAN AMERICAN: 14 mL/min — AB (ref >90)
GLUCOSE: 125 mg/dL — AB (ref 70–99)
PHOSPHORUS: 4.4 mg/dL (ref 2.3–4.6)
POTASSIUM: 4.5 mmol/L (ref 3.5–5.1)
SODIUM: 139 mmol/L (ref 135–145)

## 2015-01-15 LAB — IRON AND TIBC
IRON: 87 ug/dL (ref 42–145)
Saturation Ratios: 38 % (ref 20–55)
TIBC: 232 ug/dL — AB (ref 250–470)
UIBC: 145 ug/dL (ref 125–400)

## 2015-01-15 LAB — FERRITIN: FERRITIN: 750 ng/mL — AB (ref 10–291)

## 2015-01-15 LAB — POCT HEMOGLOBIN-HEMACUE: HEMOGLOBIN: 11 g/dL — AB (ref 12.0–15.0)

## 2015-01-15 MED ORDER — EPOETIN ALFA 20000 UNIT/ML IJ SOLN
INTRAMUSCULAR | Status: AC
  Filled 2015-01-15: qty 1

## 2015-01-15 MED ORDER — EPOETIN ALFA 20000 UNIT/ML IJ SOLN
20000.0000 [IU] | INTRAMUSCULAR | Status: DC
  Administered 2015-01-15: 20000 [IU] via SUBCUTANEOUS

## 2015-01-16 LAB — PTH, INTACT AND CALCIUM
CALCIUM TOTAL (PTH): 9.8 mg/dL (ref 8.7–10.3)
PTH: 379 pg/mL — ABNORMAL HIGH (ref 15–65)

## 2015-01-17 ENCOUNTER — Encounter (HOSPITAL_COMMUNITY): Payer: Self-pay | Admitting: Emergency Medicine

## 2015-01-17 ENCOUNTER — Emergency Department (HOSPITAL_COMMUNITY)
Admission: EM | Admit: 2015-01-17 | Discharge: 2015-01-17 | Disposition: A | Discharge status: Home / Self Care | Payer: Medicare Other | Source: Home / Self Care | Attending: Family Medicine | Admitting: Family Medicine

## 2015-01-17 DIAGNOSIS — M25552 Pain in left hip: Secondary | ICD-10-CM | POA: Diagnosis not present

## 2015-01-17 MED ORDER — TRAMADOL HCL 50 MG PO TABS: 25.0000 mg | ORAL_TABLET | Freq: Two times a day (BID) | ORAL | Status: DC | PRN

## 2015-01-17 NOTE — ED Provider Notes (Signed)
CSN: [id number]     Arrival date & time 01/17/15  1124 History   First MD Initiated Contact with Patient 01/17/15 1323     Chief Complaint  Patient presents with  . Leg Pain   (Consider location/radiation/quality/duration/timing/severity/associated sxs/prior Treatment) HPI Comments: Patient states she has been experiencing intermittent left hip and buttock pain for the last 7-10 days. She endorses that every since she had her right knee replaced, she has felt the need to alter her gait to maintain her balance when ambulating. States she will often raise her right heel and lean toward her left side when walking. No recent falls. Has used tylenol for her discomfort with moderate relief. States she experiences symptoms when she sits for long periods of time or tries to lie on her left side.   Patient is a 74 y.o. female presenting with hip pain. The history is provided by the patient.  Hip Pain This is a new problem. Episode onset: 7-10 days ago. Episode frequency: wax/wane. The problem has not changed since onset.   Past Medical History  Diagnosis Date  . Diabetes mellitus   . Hypertension   . Arthritis     Osteoarthritis  . Anemia   . History of blood transfusion   . Chronic kidney disease     Chronic renal insufficiency  . Pancreatic cyst 1999  . Thyroid disease     Hyperparathyroid   . GERD (gastroesophageal reflux disease)   . Gout   . Osteopenia   . Diverticulosis   . Hiatal hernia   . Hyperlipidemia    Past Surgical History  Procedure Laterality Date  . Pancreatic cyst excision  1999  . Knee surgery    . Joint replacement  2012    left knee  . Eye surgery Left     cataract extraction with IOL  . Cholecystectomy    . 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 Colonoscopy  03/21/12    Next one in 2018   Family History  Problem  Relation Age of Onset  . Hypertension Mother   . Cancer Mother     Colon with METS  . Diabetes Mother   . Cancer Father     Prostate and Throat   History  Substance Use Topics  . Smoking status: Never Smoker   . Smokeless tobacco: Never Used  . Alcohol Use: No   OB History    No data available     Review of Systems  All other systems reviewed and are negative.   Allergies  Ace inhibitors and Nsaids  Home Medications   Prior to Admission medications   Medication Sig Start Date End Date Taking? Authorizing Provider  ACCU-CHEK AVIVA PLUS test strip Test once daily    Unk Pinto, MD  allopurinol (ZYLOPRIM) 300 MG tablet Take 1/2 to 1 tab daily as directed. 05/21/14   Unk Pinto, MD  aspirin 81 MG tablet 1/2 tab po qd    Historical Provider, MD  Durwin Reges 5-2.5-18.5 injection  02/05/14   Historical Provider, MD  Cholecalciferol (HM VITAMIN D3) 4000 UNITS CAPS Take 4,000 Int'l Units by mouth.    Historical Provider, MD  Colesevelam HCl [facility]) 3.75 G PACK Take 3.75 each by mouth daily.     Historical Provider, MD  Ferrous Sulfate (SLOW FE PO) Take 45 mg by mouth 2 (two) times  daily.    Historical Provider, MD  Flaxseed, Linseed, (FLAXSEED OIL PO) Take 1,000 mg by mouth daily.     Historical Provider, MD  hydrochlorothiazide (HYDRODIURIL) 25 MG tablet Take 1 tablet (25 mg total) by mouth every morning. for BP and fluid 09/25/13   Unk Pinto, MD  labetalol (NORMODYNE) 200 MG tablet Take 1 tablet (200 mg total) by mouth 2 (two) times daily. 10/29/14   Unk Pinto, MD  Multiple Vitamins-Minerals (MULTIVITAMIN WITH MINERALS) tablet Take 1 tablet by mouth daily.    Historical Provider, MD  NON FORMULARY every 6 (six) weeks. Iron Injection    Historical Provider, MD  Omega-3 Fatty Acids (FISH OIL) 1000 MG CAPS Take 1,000 mg by mouth.    Historical Provider, MD  Red Yeast Rice Extract (RED YEAST RICE PO) Take 1,200 tablets by mouth daily.     Historical Provider, MD    traMADol (ULTRAM) 50 MG tablet Take 0.5 tablets (25 mg total) by mouth every 12 (twelve) hours as needed for moderate pain. 01/17/15   Annett Gula H Presson, PA   BP 186/91 mmHg  Pulse 75  Temp(Src) 98.2 F (36.8 C) (Oral)  Resp 18  SpO2 98% Physical Exam  Constitutional: She is oriented to person, place, and time. She appears well-developed and well-nourished. No distress.  HENT:  Head: Normocephalic and atraumatic.  Eyes: Conjunctivae are normal.  Cardiovascular: Normal rate.   Pulmonary/Chest: Effort normal.  Musculoskeletal: Normal range of motion.       Left hip: She exhibits normal range of motion, normal strength, no swelling, no crepitus and no deformity.       Legs: CSM exam of LLE intact  Neurological: She is alert and oriented to person, place, and time.  Skin: Skin is warm and dry.  Psychiatric: She has a normal mood and affect. Her behavior is normal.  Nursing note and vitals reviewed.   ED Course  Procedures (including critical care time) Labs Review Labs Reviewed - No data to display  Imaging Review No results found.   MDM   1. Left hip pain    NSAIDs not advised or prescribed as patient has documented chronic renal insufficiency. Will advise using tylenol and modified dose of tramadol for discomfort and arranging follow up with her orthopedist as soon as appointment available. I suspect much of her symptoms are a result of self imposed gait modification to maintain balance and that she would benefit from PT assessment, physical therapy and possible rolling walker.     Lutricia Feil, [name] 01/17/15 [phone]

## 2015-01-17 NOTE — Discharge Instructions (Signed)
You may continue to use tylenol as directed on packaging for pain. This may also be used in combination with the prescription pain medication you have been given today. I have modified the dose for this medication to accomodate your kidney issues. If symptoms persist, please follow up with Dr. Maureen Ralphs.  Arthralgia Your caregiver has diagnosed you as suffering from an arthralgia. Arthralgia means there is pain in a joint. This can come from many reasons including:  Bruising the joint which causes soreness (inflammation) in the joint.  Wear and tear on the joints which occur as we grow older (osteoarthritis).  Overusing the joint.  Various forms of arthritis.  Infections of the joint. Regardless of the cause of pain in your joint, most of these different pains respond to anti-inflammatory drugs and rest. The exception to this is when a joint is infected, and these cases are treated with antibiotics, if it is a bacterial infection. HOME CARE INSTRUCTIONS   Rest the injured area for as long as directed by your caregiver. Then slowly start using the joint as directed by your caregiver and as the pain allows. Crutches as directed may be useful if the ankles, knees or hips are involved. If the knee was splinted or casted, continue use and care as directed. If an stretchy or elastic wrapping bandage has been applied today, it should be removed and re-applied every 3 to 4 hours. It should not be applied tightly, but firmly enough to keep swelling down. Watch toes and feet for swelling, bluish discoloration, coldness, numbness or excessive pain. If any of these problems (symptoms) occur, remove the ace bandage and re-apply more loosely. If these symptoms persist, contact your caregiver or return to this location.  For the first 24 hours, keep the injured extremity elevated on pillows while lying down.  Apply ice for 15-20 minutes to the sore joint every couple hours while awake for the first half day. Then  03-04 times per day for the first 48 hours. Put the ice in a plastic bag and place a towel between the bag of ice and your skin.  Wear any splinting, casting, elastic bandage applications, or slings as instructed.  Only take over-the-counter or prescription medicines for pain, discomfort, or fever as directed by your caregiver. Do not use aspirin immediately after the injury unless instructed by your physician. Aspirin can cause increased bleeding and bruising of the tissues.  If you were given crutches, continue to use them as instructed and do not resume weight bearing on the sore joint until instructed. Persistent pain and inability to use the sore joint as directed for more than 2 to 3 days are warning signs indicating that you should see a caregiver for a follow-up visit as soon as possible. Initially, a hairline fracture (break in bone) may not be evident on X-rays. Persistent pain and swelling indicate that further evaluation, non-weight bearing or use of the joint (use of crutches or slings as instructed), or further X-rays are indicated. X-rays may sometimes not show a small fracture until a week or 10 days later. Make a follow-up appointment with your own caregiver or one to whom we have referred you. A radiologist (specialist in reading X-rays) may read your X-rays. Make sure you know how you are to obtain your X-ray results. Do not assume everything is normal if you do not hear from [place]. SEEK MEDICAL CARE IF: Bruising, swelling, or pain increases. SEEK IMMEDIATE MEDICAL CARE IF:   Your fingers or toes are  numb or blue.  The pain is not responding to medications and continues to stay the same or get worse.  The pain in your joint becomes severe.  You develop a fever over 102 F (38.9 C).  It becomes impossible to move or use the joint. MAKE SURE YOU:   Understand these instructions.  Will watch your condition.  Will get help right away if you are not doing well or get  worse. Document Released: 11/06/2005 Document Revised: 01/29/2012 Document Reviewed: 06/24/2008 [facility] Patient Information 2015 [place], [place]. This information is not intended to replace advice given to you by your health care provider. Make sure you discuss any questions you have with your health care provider.

## 2015-01-17 NOTE — ED Notes (Signed)
Pain in left leg from hip to and including foot.  No back pain.  Onset 2/20.  No known injury.  Patient is active female , involved in exercise programs-no new to patient.  If lying down , on back or right side, feels a non-painful sensation in left thigh.  If she puts pressure on left thigh or attempting to walk, has significant pain.  Patient reports having this pain in right thigh for 6 months, and pain is specific to thigh not traveling down leg.  Patient reports bilateral knee surgeries, legs uneven now.  Also history of bow-legged.

## 2015-02-02 DIAGNOSIS — M160 Bilateral primary osteoarthritis of hip: Secondary | ICD-10-CM | POA: Diagnosis not present

## 2015-02-09 ENCOUNTER — Other Ambulatory Visit: Payer: Self-pay

## 2015-02-09 ENCOUNTER — Telehealth: Payer: Self-pay

## 2015-02-09 DIAGNOSIS — Z1231 Encounter for screening mammogram for malignant neoplasm of breast: Secondary | ICD-10-CM

## 2015-02-09 DIAGNOSIS — M8580 Other specified disorders of bone density and structure, unspecified site: Secondary | ICD-10-CM

## 2015-02-09 NOTE — Telephone Encounter (Signed)
Patient called to ask where last bone density and mammogram were done and when they were done. Last bone density and mammogram were done at [facility]. Patient was due for both. Scheduled for 02-25-15 at 10:00. Patient aware of appointment. Mailed appointment details to patient.

## 2015-02-11 ENCOUNTER — Other Ambulatory Visit: Payer: Self-pay | Admitting: Internal Medicine

## 2015-02-12 ENCOUNTER — Encounter (HOSPITAL_COMMUNITY)
Admission: RE | Admit: 2015-02-12 | Discharge: 2015-02-12 | Disposition: A | Discharge status: Home / Self Care | Payer: Medicare Other | Source: Ambulatory Visit | Attending: Nephrology | Admitting: Nephrology

## 2015-02-12 DIAGNOSIS — N183 Chronic kidney disease, stage 3 (moderate): Secondary | ICD-10-CM | POA: Insufficient documentation

## 2015-02-12 DIAGNOSIS — D631 Anemia in chronic kidney disease: Secondary | ICD-10-CM | POA: Diagnosis not present

## 2015-02-12 LAB — POCT HEMOGLOBIN-HEMACUE: HEMOGLOBIN: 10.5 g/dL — AB (ref 12.0–15.0)

## 2015-02-12 LAB — RENAL FUNCTION PANEL
ALBUMIN: 3.3 g/dL — AB (ref 3.5–5.2)
ANION GAP: 10 (ref 5–15)
BUN: 60 mg/dL — ABNORMAL HIGH (ref 6–23)
CO2: 22 mmol/L (ref 19–32)
Calcium: 10.2 mg/dL (ref 8.4–10.5)
Chloride: 106 mmol/L (ref 96–112)
Creatinine, Ser: 3.53 mg/dL — ABNORMAL HIGH (ref 0.50–1.10)
GFR calc Af Amer: 14 mL/min — ABNORMAL LOW (ref >90)
GFR, EST NON AFRICAN AMERICAN: 12 mL/min — AB (ref >90)
Glucose, Bld: 98 mg/dL (ref 70–99)
PHOSPHORUS: 4.2 mg/dL (ref 2.3–4.6)
POTASSIUM: 4.2 mmol/L (ref 3.5–5.1)
Sodium: 138 mmol/L (ref 135–145)

## 2015-02-12 LAB — FERRITIN: Ferritin: 645 ng/mL — ABNORMAL HIGH (ref 10–291)

## 2015-02-12 LAB — IRON AND TIBC
Iron: 103 ug/dL (ref 42–145)
Saturation Ratios: 48 % (ref 20–55)
TIBC: 215 ug/dL — AB (ref 250–470)
UIBC: 112 ug/dL — ABNORMAL LOW (ref 125–400)

## 2015-02-12 MED ORDER — EPOETIN ALFA 20000 UNIT/ML IJ SOLN
INTRAMUSCULAR | Status: AC
  Administered 2015-02-12: 20000 [IU] via SUBCUTANEOUS
  Filled 2015-02-12: qty 1

## 2015-02-12 MED ORDER — EPOETIN ALFA 20000 UNIT/ML IJ SOLN
20000.0000 [IU] | INTRAMUSCULAR | Status: DC
  Administered 2015-02-12: 20000 [IU] via SUBCUTANEOUS

## 2015-02-13 LAB — PTH, INTACT AND CALCIUM
Calcium, Total (PTH): 9.9 mg/dL (ref 8.7–10.3)
PTH: 329 pg/mL — ABNORMAL HIGH (ref 15–65)

## 2015-02-25 ENCOUNTER — Ambulatory Visit (HOSPITAL_COMMUNITY): Payer: Medicare Other

## 2015-02-25 ENCOUNTER — Ambulatory Visit (HOSPITAL_COMMUNITY)
Admission: RE | Admit: 2015-02-25 | Discharge: 2015-02-25 | Disposition: A | Discharge status: Home / Self Care | Payer: Medicare Other | Source: Ambulatory Visit | Attending: Internal Medicine | Admitting: Internal Medicine

## 2015-02-25 ENCOUNTER — Other Ambulatory Visit (HOSPITAL_COMMUNITY): Payer: Medicare Other

## 2015-02-25 DIAGNOSIS — M8589 Other specified disorders of bone density and structure, multiple sites: Secondary | ICD-10-CM | POA: Diagnosis not present

## 2015-02-25 DIAGNOSIS — Z1231 Encounter for screening mammogram for malignant neoplasm of breast: Secondary | ICD-10-CM | POA: Insufficient documentation

## 2015-02-25 DIAGNOSIS — M8580 Other specified disorders of bone density and structure, unspecified site: Secondary | ICD-10-CM

## 2015-02-25 DIAGNOSIS — Z780 Asymptomatic menopausal state: Secondary | ICD-10-CM | POA: Diagnosis not present

## 2015-02-25 DIAGNOSIS — Z13820 Encounter for screening for osteoporosis: Secondary | ICD-10-CM | POA: Diagnosis not present

## 2015-03-05 DIAGNOSIS — M1611 Unilateral primary osteoarthritis, right hip: Secondary | ICD-10-CM | POA: Diagnosis not present

## 2015-03-12 ENCOUNTER — Encounter (HOSPITAL_COMMUNITY)
Admission: RE | Admit: 2015-03-12 | Discharge: 2015-03-12 | Disposition: A | Discharge status: Home / Self Care | Payer: Medicare Other | Source: Ambulatory Visit | Attending: Nephrology | Admitting: Nephrology

## 2015-03-12 DIAGNOSIS — D631 Anemia in chronic kidney disease: Secondary | ICD-10-CM | POA: Insufficient documentation

## 2015-03-12 DIAGNOSIS — N183 Chronic kidney disease, stage 3 (moderate): Secondary | ICD-10-CM | POA: Insufficient documentation

## 2015-03-12 LAB — RENAL FUNCTION PANEL
ALBUMIN: 3.4 g/dL — AB (ref 3.5–5.2)
Anion gap: 10 (ref 5–15)
BUN: 59 mg/dL — ABNORMAL HIGH (ref 6–23)
CO2: 23 mmol/L (ref 19–32)
CREATININE: 3.62 mg/dL — AB (ref 0.50–1.10)
Calcium: 10.1 mg/dL (ref 8.4–10.5)
Chloride: 109 mmol/L (ref 96–112)
GFR calc Af Amer: 13 mL/min — ABNORMAL LOW (ref >90)
GFR, EST NON AFRICAN AMERICAN: 11 mL/min — AB (ref >90)
Glucose, Bld: 111 mg/dL — ABNORMAL HIGH (ref 70–99)
Phosphorus: 3.8 mg/dL (ref 2.3–4.6)
Potassium: 4.7 mmol/L (ref 3.5–5.1)
Sodium: 142 mmol/L (ref 135–145)

## 2015-03-12 LAB — IRON AND TIBC
Iron: 96 ug/dL (ref 42–145)
Saturation Ratios: 42 % (ref 20–55)
TIBC: 227 ug/dL — ABNORMAL LOW (ref 250–470)
UIBC: 131 ug/dL (ref 125–400)

## 2015-03-12 LAB — POCT HEMOGLOBIN-HEMACUE: HEMOGLOBIN: 10.1 g/dL — AB (ref 12.0–15.0)

## 2015-03-12 MED ORDER — EPOETIN ALFA 20000 UNIT/ML IJ SOLN
INTRAMUSCULAR | Status: AC
  Filled 2015-03-12: qty 1

## 2015-03-12 MED ORDER — EPOETIN ALFA 20000 UNIT/ML IJ SOLN
20000.0000 [IU] | INTRAMUSCULAR | Status: DC
  Administered 2015-03-12: 20000 [IU] via SUBCUTANEOUS

## 2015-03-13 LAB — PTH, INTACT AND CALCIUM
Calcium, Total (PTH): 10.1 mg/dL (ref 8.7–10.3)
PTH: 376 pg/mL — ABNORMAL HIGH (ref 15–65)

## 2015-03-13 LAB — FERRITIN: FERRITIN: 688 ng/mL — AB (ref 10–291)

## 2015-03-22 DIAGNOSIS — I129 Hypertensive chronic kidney disease with stage 1 through stage 4 chronic kidney disease, or unspecified chronic kidney disease: Secondary | ICD-10-CM | POA: Diagnosis not present

## 2015-03-22 DIAGNOSIS — N184 Chronic kidney disease, stage 4 (severe): Secondary | ICD-10-CM | POA: Diagnosis not present

## 2015-03-22 DIAGNOSIS — D638 Anemia in other chronic diseases classified elsewhere: Secondary | ICD-10-CM | POA: Diagnosis not present

## 2015-03-22 DIAGNOSIS — E785 Hyperlipidemia, unspecified: Secondary | ICD-10-CM | POA: Diagnosis not present

## 2015-03-26 ENCOUNTER — Encounter: Payer: Self-pay | Admitting: Internal Medicine

## 2015-03-26 ENCOUNTER — Ambulatory Visit (INDEPENDENT_AMBULATORY_CARE_PROVIDER_SITE_OTHER): Payer: Medicare Other | Admitting: Internal Medicine

## 2015-03-26 VITALS — BP 148/90 | HR 60 | Temp 97.3°F | Resp 16 | Ht 61.5 in | Wt 133.0 lb

## 2015-03-26 DIAGNOSIS — Z0000 Encounter for general adult medical examination without abnormal findings: Secondary | ICD-10-CM

## 2015-03-26 DIAGNOSIS — Z0001 Encounter for general adult medical examination with abnormal findings: Secondary | ICD-10-CM | POA: Diagnosis not present

## 2015-03-26 DIAGNOSIS — E782 Mixed hyperlipidemia: Secondary | ICD-10-CM

## 2015-03-26 DIAGNOSIS — E559 Vitamin D deficiency, unspecified: Secondary | ICD-10-CM

## 2015-03-26 DIAGNOSIS — R6889 Other general symptoms and signs: Secondary | ICD-10-CM

## 2015-03-26 DIAGNOSIS — I150 Renovascular hypertension: Secondary | ICD-10-CM

## 2015-03-26 DIAGNOSIS — Z9181 History of falling: Secondary | ICD-10-CM

## 2015-03-26 DIAGNOSIS — Z79899 Other long term (current) drug therapy: Secondary | ICD-10-CM | POA: Diagnosis not present

## 2015-03-26 DIAGNOSIS — N184 Chronic kidney disease, stage 4 (severe): Secondary | ICD-10-CM

## 2015-03-26 DIAGNOSIS — K2100 Gastro-esophageal reflux disease with esophagitis, without bleeding: Secondary | ICD-10-CM

## 2015-03-26 DIAGNOSIS — Z1331 Encounter for screening for depression: Secondary | ICD-10-CM

## 2015-03-26 DIAGNOSIS — R7309 Other abnormal glucose: Secondary | ICD-10-CM

## 2015-03-26 DIAGNOSIS — M109 Gout, unspecified: Secondary | ICD-10-CM

## 2015-03-26 DIAGNOSIS — R7303 Prediabetes: Secondary | ICD-10-CM

## 2015-03-26 LAB — HEPATIC FUNCTION PANEL
ALT: 14 U/L (ref 0–35)
AST: 18 U/L (ref 0–37)
Albumin: 3.8 g/dL (ref 3.5–5.2)
Alkaline Phosphatase: 156 U/L — ABNORMAL HIGH (ref 39–117)
BILIRUBIN INDIRECT: 0.5 mg/dL (ref 0.2–1.2)
BILIRUBIN TOTAL: 0.6 mg/dL (ref 0.2–1.2)
Bilirubin, Direct: 0.1 mg/dL (ref 0.0–0.3)
TOTAL PROTEIN: 6.7 g/dL (ref 6.0–8.3)

## 2015-03-26 LAB — LIPID PANEL
CHOLESTEROL: 218 mg/dL — AB (ref 0–200)
HDL: 81 mg/dL (ref >=46)
LDL CALC: 121 mg/dL — AB (ref 0–99)
TRIGLYCERIDES: 78 mg/dL (ref <150)
Total CHOL/HDL Ratio: 2.7 Ratio
VLDL: 16 mg/dL (ref 0–40)

## 2015-03-26 LAB — MAGNESIUM: Magnesium: 2.1 mg/dL (ref 1.5–2.5)

## 2015-03-26 NOTE — Progress Notes (Signed)
Patient ID: Eileen Perez, female   DOB: 08/29/1941, 74 y.o.   MRN: [id number]  MEDICARE ANNUAL WELLNESS VISIT AND OV  Assessment:   1. Renovascular hypertension  - TSH  2. Hyperlipidemia  - Lipid panel  3. Prediabetes  - Hemoglobin A1c - Insulin, random  4. Vitamin D deficiency  - Vit D  25 hydroxy (rtn osteoporosis monitoring)  5. Gout, unspecified cause, unspecified chronicity, unspecified site   6. 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 GERD   8. Medication management  - Hepatic function panel - Magnesium  9. Depression screen   10. At low risk for fall  11. Surgical Clearance for upcoming Rt THR  - request pharmacologic stress cardiac testing due to pt's advanced age & multiple high risk co-morbidities   Plan:   During the course of the visit the patient was educated and counseled about appropriate screening and preventive services including:    Pneumococcal vaccine   Influenza vaccine  Td vaccine  Screening electrocardiogram  Bone densitometry screening  Colorectal cancer screening  Diabetes screening  Glaucoma screening  Nutrition counseling   Advanced directives: requested  Screening recommendations, referrals: Vaccinations:  Immunization History  Administered Date(s) Administered  . DT 02/05/2014  . Influenza, High Dose Seasonal PF 09/25/2013  . Pneumococcal Polysaccharide-23 11/20/2009  . Td 11/21/2003  . Zoster 11/20/2005  Prevnar vaccine undecided Hep B vaccine not indicated  Nutrition assessed and recommended  Colonoscopy 03/21/2012 - Dr Daine Gravel Recommended yearly ophthalmology/optometry visit for glaucoma screening and checkup Recommended yearly dental visit for hygiene and checkup Advanced directives - yes  Conditions/risks identified: BMI: Discussed weight loss, diet, and increase physical activity.  Increase physical activity: AHA recommends 150 minutes of physical activity a week.  Medications  reviewed Diabetes is not at goal, ACE/ARB therapy: Yes. Urinary Incontinence is not an issue: discussed non pharmacology and pharmacology options.  Fall risk: low- discussed PT, home fall assessment, medications.   Subjective:    Eileen Perez is a 74 y.o.  female who presents for Medicare Annual Wellness Visit and complete physical.  Date of last medicare wellness visit was 01/26/2014. This very nice 74 y.o. 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  Patient is s/p bilateral TKR's and is scheduled for July 6th Rt THR by Dr Elmyra Ricks.    Patient has hx/o HTN circa. 1990's & BP has not been controlled at home. Patient was seen recently by Dr Moshe Cipro and had BP meds adjusted. Today's BP was elevated at 148/90. Patient has been evaluated in the past by Dr Kirk Ruths and had neg Cardiolite and echocardiogram. . Patient has had no complaints of any cardiac type chest pain, palpitations, dyspnea/orthopnea/PND, dizziness, claudication, or dependent edema.   Hyperlipidemia is controlled with diet & meds. Patient denies myalgias or other med SE's. Last Lipids were not at goal - Total Chol 220; HDL 75; LDL 124; Trig 103 on 08/12/2014.   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 She has no hx or k/o Diabetic eye Dz. Last A1c was not at goal -  6.1% on 08/12/2014. Patient is followed expectantly  by Dr Raymondo Band for her CKD.   Further, the patient also has history of Vitamin D Deficiency and supplements vitamin D without any suspected side-effects. Last vitamin D  was 49 on 08/12/2014.  Names of Other Physician/Practitioners you currently use: 1. [facility] here for primary care 2. Dr Herbert Deaner, eye doctor, last visit 2014 3. [facility] - ? Dr , dentist, last visit 2014  Patient Care Team: Unk Pinto, MD as PCP - General (Internal Medicine) Gaynelle Arabian, MD as Consulting Physician (Orthopedic Surgery) Corliss Parish, MD as Consulting Physician (Nephrology) Monna Fam, MD as Consulting Physician (Ophthalmology) Laurence Spates, MD as Consulting Physician (Gastroenterology) Lelon Perla, MD as Consulting Physician (Cardiology) Kathie Rhodes, MD as Consulting Physician (Urology)  Medication Review: Medication Sig  . ACCU-CHEK AVIVA PLUS test strip Test once daily  . allopurinol (ZYLOPRIM) 300 MG tablet Take 1/2 to 1 tab daily as directed.  Marland Kitchen aspirin 81 MG tablet 1/2 tab po qd  . BOOSTRIX 5-2.5-18.5 injection   . Cholecalciferol (HM VITAMIN D3) 4000 UNITS CAPS Take 4,000 Int'l Units by mouth.  . Colesevelam HCl (WELCHOL) 3.75 G PACK Take 3.75 each by mouth daily.   . Ferrous Sulfate (SLOW FE PO) Take 45 mg by mouth 2 (two) times daily.  . Flaxseed, Linseed, (FLAXSEED OIL PO) Take 1,000 mg by mouth daily.   . hydrochlorothiazide (HYDRODIURIL) 25 MG tablet Take 1 tablet by mouth  every morning for blood  pressure and fluid  . labetalol (NORMODYNE) 200 MG tablet Take 1 tablet (200 mg total) by mouth 2 (two) times daily.  . Multiple Vitamins-Minerals (MULTIVITAMIN WITH MINERALS) tablet Take 1 tablet by mouth daily.  . NON FORMULARY every 6 (six) weeks. Iron Injection  . Omega-3 Fatty Acids (FISH OIL) 1000 MG CAPS Take 1,000 mg by mouth.  . Red Yeast Rice Extract (RED YEAST RICE PO) Take 1,200 tablets by mouth daily.   . traMADol (ULTRAM) 50 MG tablet Take 0.5 tablets (25 mg total) by mouth every 12 (twelve) hours as needed for moderate pain.  . [DISCONTINUED] diltiazem (CARDIZEM) 120 MG tablet Take 120 mg by mouth 2 (two) times daily.   Current Problems (verified) Patient Active Problem List   Diagnosis Date Noted  . Secondary hyperparathyroidism (CKD) 04/16/2014  . Vitamin D deficiency 04/16/2014   . Prediabetes 04/16/2014  . Hyperlipidemia 04/16/2014  . Medication management 04/16/2014  . Anemia of chronic Renal Dz   . Pancreatic cyst   . GERD   . Gout   . Osteopenia   . Diverticulosis   . 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 Hypertension 06/10/2013  . DJD 06/09/2013   Screening Tests Health Maintenance  Topic Date Due  . PNA vac Low Risk Adult (2 of 2 - PPSV23) 11/20/2010  . TETANUS/TDAP  11/20/2013  . OPHTHALMOLOGY EXAM  09/15/2014  . FOOT EXAM  02/10/2015  . HEMOGLOBIN A1C  02/10/2015  . INFLUENZA VACCINE  06/21/2015  . URINE MICROALBUMIN  08/13/2015  . COLONOSCOPY  09/16/2023  . DEXA SCAN  Completed  . ZOSTAVAX  Completed   Immunization History  Administered Date(s) Administered  . DT 02/05/2014  . Influenza, High Dose Seasonal PF 09/25/2013  . Pneumococcal Polysaccharide-23 11/20/2009  . Td 11/21/2003  . Zoster 11/20/2005   Preventative care: Last colonoscopy: 03/21/2012  History reviewed: allergies, current medications, past family history, past medical history, past social history, past surgical history and problem list  Risk Factors: Tobacco History  Substance Use Topics  . Smoking status: Never Smoker   . Smokeless tobacco: Never Used  . Alcohol Use: No   She does not smoke.  Patient  is not a former smoker. Are there smokers in your home (other than you)?  No Alcohol Current alcohol use: none  Caffeine Current caffeine use: denies use  Exercise Current exercise: none  Nutrition/Diet Current diet: in general, a "healthy" diet    Cardiac risk factors: advanced age (older than 49 for men, 55 for women), diabetes mellitus, dyslipidemia, hypertension and sedentary lifestyle.  Depression Screen (Note: if answer to either of the following is "Yes", a more complete depression screening is indicated)   Q1: Over the past two weeks, have you felt down, depressed or hopeless? No  Q2: Over the past two weeks, have you felt little interest  or pleasure in doing things? No  Have you lost interest or pleasure in daily life? No  Do you often feel hopeless? No  Do you cry easily over simple problems? No  Activities of Daily Living In your present state of health, do you have any difficulty performing the following activities?:  Driving? No Managing money?  No Feeding yourself? No Getting from bed to chair? No Climbing a flight of stairs? No Preparing food and eating?: No Bathing or showering? No Getting dressed: No Getting to the toilet? No Using the toilet:No Moving around from place to place: No In the past year have you fallen or had a near fall?:No   Are you sexually active?  No  Do you have more than one partner?  No  Vision Difficulties: No  Hearing Difficulties: No Do you often ask people to speak up or repeat themselves? No Do you experience ringing or noises in your ears? No Do you have difficulty understanding soft or whispered voices? Sometimes.  Cognition  Do you feel that you have a problem with memory?No  Do you often misplace items? No  Do you feel safe at home?  Yes  Advanced directives Does patient have a [place]? Yes Does patient have a Living Will? Yes  Past Medical History  Diagnosis Date  . Diabetes mellitus   . Hypertension   . Arthritis     Osteoarthritis  . Anemia   . History of blood transfusion   . Chronic kidney disease     Chronic renal insufficiency  . Pancreatic cyst 1999  . Thyroid disease     Hyperparathyroid   . GERD (gastroesophageal reflux disease)   . Gout   . Osteopenia   . Diverticulosis   . Hiatal hernia   . Hyperlipidemia    Past Surgical History  Procedure Laterality Date  . Pancreatic cyst excision  1999  . Knee surgery    . Joint replacement  2012    left knee  . Eye surgery Left     cataract extraction with IOL  . Cholecystectomy    . Total knee arthroplasty Right 06/09/2013    Procedure: RIGHT TOTAL KNEE ARTHROPLASTY;   Surgeon: Gearlean Alf, MD;  Location: [facility];  Service: Orthopedics;  Laterality: Right;  . Colonoscopy  03/21/12    Next one in 2018    ROS: Constitutional: Denies fever, chills, weight loss/gain, headaches, insomnia, fatigue, night sweats, and change in appetite. Eyes: Denies redness, blurred vision, diplopia, discharge, itchy, watery eyes.  ENT: Denies discharge, congestion, post nasal drip, epistaxis, sore throat, earache, hearing loss, dental pain, Tinnitus, Vertigo, Sinus pain, snoring.  Cardio: Denies chest pain, palpitations, irregular heartbeat, syncope, dyspnea, diaphoresis, orthopnea, PND, claudication, edema Respiratory: denies cough, dyspnea, DOE, pleurisy, hoarseness, laryngitis, wheezing.  Gastrointestinal: Denies dysphagia, heartburn, reflux,  water brash, pain, cramps, nausea, vomiting, bloating, diarrhea, constipation, hematemesis, melena, hematochezia, jaundice, hemorrhoids Genitourinary: Denies dysuria, frequency, urgency, nocturia, hesitancy, discharge, hematuria, flank pain Breast: Breast lumps, nipple discharge, bleeding.  Musculoskeletal: Denies arthralgia, myalgia, stiffness, Jt. Swelling, pain, limp, and strain/sprain. Denies falls. Skin: Denies puritis, rash, hives, warts, acne, eczema, changing in skin lesion Neuro: No weakness, tremor, incoordination, spasms, paresthesia, pain Psychiatric: Denies confusion, memory loss, sensory loss. Denies Depression. Endocrine: Denies change in weight, skin, hair change, nocturia, and paresthesia, diabetic polys, visual blurring, hyper / hypo glycemic episodes.  Heme/Lymph: No excessive bleeding, bruising, enlarged lymph nodes  Objective:     BP 148/90 mmHg  Pulse 60  Temp(Src) 97.3 F (36.3 C)  Resp 16  Ht 5' 1.5" (1.562 m)  Wt 133 lb (60.328 kg)  BMI 24.73 kg/m2  General Appearance: Pleasant older BF appearing younger than her chronologic age.  Eyes: PERRLA, EOMs, conjunctiva no swelling or erythema, normal fundi and  vessels. Sinuses: No frontal/maxillary tenderness ENT/Mouth: EACs patent / TMs  nl. Nares clear without erythema, swelling, mucoid exudates. Oral hygiene is good. No erythema, swelling, or exudate. Tongue normal, non-obstructing. Tonsils not swollen or erythematous. Hearing normal.  Neck: Supple, thyroid normal. No bruits, nodes or JVD. Respiratory: Respiratory effort normal.  BS equal and clear bilateral without rales, rhonci, wheezing or stridor. Cardio: Heart sounds are normal with regular rate and rhythm and no murmurs, rubs or gallops. Peripheral pulses are normal and equal bilaterally without edema. No aortic or femoral bruits. Chest: symmetric with normal excursions and percussion. Abdomen: Flat, soft  with nl bowel sounds. Nontender, no guarding, rebound, hernias, masses, or organomegaly.  Lymphatics: Non tender without lymphadenopathy.   Musculoskeletal: Limping BB gait stabilized with a cane. Skin: Warm and dry without rashes, lesions, cyanosis, clubbing or  ecchymosis.  Neuro: Cranial nerves intact, reflexes equal bilaterally. Normal muscle tone, no cerebellar symptoms. Sensation intact.  Pysch: Alert and oriented X 3, normal affect, Insight and Judgment appropriate.   Cognitive Testing  Alert? Yes  Normal Appearance?Yes  Oriented to person? Yes  Place? Yes   Time? Yes  Recall of three objects?  Yes  Can perform simple calculations? Yes  Displays appropriate judgment? Yes  Can read the correct time from a watch/clock?Yes  Medicare Attestation I have personally reviewed: The patient's medical and social history Their use of alcohol, tobacco or illicit drugs Their current medications and supplements The patient's functional ability including ADLs, fall risks, home safety risks, cognitive, and hearing and visual impairment Diet and physical activities Evidence for depression or mood disorders  The patient's weight, height, BMI, and visual acuity have been recorded in the  chart.  I have made referrals, counseling, and provided education to the patient based on review of the above and I have provided the patient with a written personalized care plan for preventive services.  Over 40 minutes of exam, counseling, chart review was performed.  Lakena Sparlin DAVID, MD   03/26/2015

## 2015-03-26 NOTE — Patient Instructions (Signed)
+++++++++++++++++++  Recommend Low dose or baby Aspirin 81 mg daily   To reduce risk of Colon Cancer 20 %, Skin Cancer 26 % , Melanoma 46% and   Pancreatic cancer 60%  ++++++++++++++++++++ Vitamin D goal is between 70-100.   Please make sure that you are taking your Vitamin D as directed.   It is very important as a natural antiinflammatory   helping hair, skin, and nails, as well as reducing stroke and heart attack risk.   It helps your bones and helps with mood.  It also decreases numerous cancer risks so please take it as directed.   Low Vit D is associated with a 200-300% higher risk for CANCER   and 200-300% higher risk for HEART   ATTACK  &  STROKE.    ................................................  It is also associated with higher death rate at younger ages,   autoimmune diseases like Rheumatoid arthritis, Lupus, Multiple Sclerosis.     Also many other serious conditions, like depression, Alzheimer's  Dementia, infertility, muscle aches, fatigue, fibromyalgia - just to name a few.  +++++++++++++++ Recommend the book "The END of DIETING" by Dr Joel Fuhrman   & the book "The END of DIABETES " by Dr Joel Fuhrman  At [employer] - get book & Audio CD's     Being diabetic has a  300% increased risk for heart attack, stroke, cancer, and alzheimer- type vascular dementia. It is very important that you work harder with diet by avoiding all foods that are white. Avoid white rice (brown & wild rice is OK), white potatoes (sweetpotatoes in moderation is OK), White bread or wheat bread or anything made out of white flour like bagels, donuts, rolls, buns, biscuits, cakes, pastries, cookies, pizza crust, and pasta (made from white flour & egg whites) - vegetarian pasta or spinach or wheat pasta is OK. Multigrain breads like Arnold's or [employer], or multigrain sandwich thins or flatbreads.  Diet, exercise and weight loss can reverse and cure diabetes in the early stages.   Diet, exercise and weight loss is very important in the control and prevention of complications of diabetes which affects every system in your body, ie. Brain - dementia/stroke, eyes - glaucoma/blindness, heart - heart attack/heart failure, kidneys - dialysis, stomach - gastric paralysis, intestines - malabsorption, nerves - severe painful neuritis, circulation - gangrene & loss of a leg(s), and finally cancer and Alzheimers.    I recommend avoid fried & greasy foods,  sweets/candy, white rice (brown or wild rice or Quinoa is OK), white potatoes (sweet potatoes are OK) - anything made from white flour - bagels, doughnuts, rolls, buns, biscuits,white and wheat breads, pizza crust and traditional pasta made of white flour & egg white(vegetarian pasta or spinach or wheat pasta is OK).  Multi-grain bread is OK - like multi-grain flat bread or sandwich thins. Avoid alcohol in excess. Exercise is also important.    Eat all the vegetables you want - avoid meat, especially red meat and dairy - especially cheese.  Cheese is the most concentrated form of trans-fats which is the worst thing to clog up our arteries. Veggie cheese is OK which can be found in the fresh produce section at [employer] or [employer] or [employer]  ++++++++++++++++++++++++++++++++++ Preventive Care for Adults  A healthy lifestyle and preventive care can promote health and wellness. Preventive health guidelines for women include the following key practices.  A routine yearly physical is a good way to check with your health care provider about   your health and preventive screening. It is a chance to share any concerns and updates on your health and to receive a thorough exam.  Visit your dentist for a routine exam and preventive care every 6 months. Brush your teeth twice a day and floss once a day. Good oral hygiene prevents tooth decay and gum disease.  The frequency of eye exams is based on your age, health, family medical history,  use of contact lenses, and other factors. Follow your health care provider's recommendations for frequency of eye exams.  Eat a healthy diet. Foods like vegetables, fruits, whole grains, low-fat dairy products, and lean protein foods contain the nutrients you need without too many calories. Decrease your intake of foods high in solid fats, added sugars, and salt. Eat the right amount of calories for you.Get information about a proper diet from your health care provider, if necessary.  Regular physical exercise is one of the most important things you can do for your health. Most adults should get at least 150 minutes of moderate-intensity exercise (any activity that increases your heart rate and causes you to sweat) each week. In addition, most adults need muscle-strengthening exercises on 2 or more days a week.  Maintain a healthy weight. The body mass index (BMI) is a screening tool to identify possible weight problems. It provides an estimate of body fat based on height and weight. Your health care provider can find your BMI and can help you achieve or maintain a healthy weight.For adults 20 years and older:  A BMI below 18.5 is considered underweight.  A BMI of 18.5 to 24.9 is normal.  A BMI of 25 to 29.9 is considered overweight.  A BMI of 30 and above is considered obese.  Maintain normal blood lipids and cholesterol levels by exercising and minimizing your intake of saturated fat. Eat a balanced diet with plenty of fruit and vegetables. If your lipid or cholesterol levels are high, you are over 50, or you are at high risk for heart disease, you may need your cholesterol levels checked more frequently.Ongoing high lipid and cholesterol levels should be treated with medicines if diet and exercise are not working.  If you smoke, find out from your health care provider how to quit. If you do not use tobacco, do not start.  Lung cancer screening is recommended for adults aged 55-80 years who  are at high risk for developing lung cancer because of a history of smoking. A yearly low-dose CT scan of the lungs is recommended for people who have at least a 30-pack-year history of smoking and are a current smoker or have quit within the past 15 years. A pack year of smoking is smoking an average of 1 pack of cigarettes a day for 1 year (for example: 1 pack a day for 30 years or 2 packs a day for 15 years). Yearly screening should continue until the smoker has stopped smoking for at least 15 years. Yearly screening should be stopped for people who develop a health problem that would prevent them from having lung cancer treatment.  Avoid use of street drugs. Do not share needles with anyone. Ask for help if you need support or instructions about stopping the use of drugs.  High blood pressure causes heart disease and increases the risk of stroke.  Ongoing high blood pressure should be treated with medicines if weight loss and exercise do not work.  If you are 55-79 years old, ask your health care provider if   you should take aspirin to prevent strokes.  Diabetes screening involves taking a blood sample to check your fasting blood sugar level. This should be done once every 3 years, after age 45, if you are within normal weight and without risk factors for diabetes. Testing should be considered at a younger age or be carried out more frequently if you are overweight and have at least 1 risk factor for diabetes.  Breast cancer screening is essential preventive care for women. You should practice "breast self-awareness." This means understanding the normal appearance and feel of your breasts and may include breast self-examination. Any changes detected, no matter how small, should be reported to a health care provider. Women in their 20s and 30s should have a clinical breast exam (CBE) by a health care provider as part of a regular health exam every 1 to 3 years. After age 40, women should have a CBE every  year. Starting at age 40, women should consider having a mammogram (breast X-ray test) every year. Women who have a family history of breast cancer should talk to their health care provider about genetic screening. Women at a high risk of breast cancer should talk to their health care providers about having an MRI and a mammogram every year.  Breast cancer gene (BRCA)-related cancer risk assessment is recommended for women who have family members with BRCA-related cancers. BRCA-related cancers include breast, ovarian, tubal, and peritoneal cancers. Having family members with these cancers may be associated with an increased risk for harmful changes (mutations) in the breast cancer genes BRCA1 and BRCA2. Results of the assessment will determine the need for genetic counseling and BRCA1 and BRCA2 testing.  Routine pelvic exams to screen for cancer are no longer recommended for nonpregnant women who are considered low risk for cancer of the pelvic organs (ovaries, uterus, and vagina) and who do not have symptoms. Ask your health care provider if a screening pelvic exam is right for you.  If you have had past treatment for cervical cancer or a condition that could lead to cancer, you need Pap tests and screening for cancer for at least 20 years after your treatment. If Pap tests have been discontinued, your risk factors (such as having a new sexual partner) need to be reassessed to determine if screening should be resumed. Some women have medical problems that increase the chance of getting cervical cancer. In these cases, your health care provider may recommend more frequent screening and Pap tests.    Colorectal cancer can be detected and often prevented. Most routine colorectal cancer screening begins at the age of 50 years and continues through age 75 years. However, your health care provider may recommend screening at an earlier age if you have risk factors for colon cancer. On a yearly basis, your health  care provider may provide home test kits to check for hidden blood in the stool. Use of a small camera at the end of a tube, to directly examine the colon (sigmoidoscopy or colonoscopy), can detect the earliest forms of colorectal cancer. Talk to your health care provider about this at age 50, when routine screening begins. Direct exam of the colon should be repeated every 5-10 years through age 75 years, unless early forms of pre-cancerous polyps or small growths are found.  Osteoporosis is a disease in which the bones lose minerals and strength with aging. This can result in serious bone fractures or breaks. The risk of osteoporosis can be identified using a bone density scan.   Women ages 65 years and over and women at risk for fractures or osteoporosis should discuss screening with their health care providers. Ask your health care provider whether you should take a calcium supplement or vitamin D to reduce the rate of osteoporosis.  Menopause can be associated with physical symptoms and risks. Hormone replacement therapy is available to decrease symptoms and risks. You should talk to your health care provider about whether hormone replacement therapy is right for you.  Use sunscreen. Apply sunscreen liberally and repeatedly throughout the day. You should seek shade when your shadow is shorter than you. Protect yourself by wearing long sleeves, pants, a wide-brimmed hat, and sunglasses year round, whenever you are outdoors.  Once a month, do a whole body skin exam, using a mirror to look at the skin on your back. Tell your health care provider of new moles, moles that have irregular borders, moles that are larger than a pencil eraser, or moles that have changed in shape or color.  Stay current with required vaccines (immunizations).  Influenza vaccine. All adults should be immunized every year.  Tetanus, diphtheria, and acellular pertussis (Td, Tdap) vaccine. Pregnant women should receive 1 dose of  Tdap vaccine during each pregnancy. The dose should be obtained regardless of the length of time since the last dose. Immunization is preferred during the 27th-36th week of gestation. An adult who has not previously received Tdap or who does not know her vaccine status should receive 1 dose of Tdap. This initial dose should be followed by tetanus and diphtheria toxoids (Td) booster doses every 10 years. Adults with an unknown or incomplete history of completing a 3-dose immunization series with Td-containing vaccines should begin or complete a primary immunization series including a Tdap dose. Adults should receive a Td booster every 10 years.    Zoster vaccine. One dose is recommended for adults aged 60 years or older unless certain conditions are present.    Pneumococcal 13-valent conjugate (PCV13) vaccine. When indicated, a person who is uncertain of her immunization history and has no record of immunization should receive the PCV13 vaccine. An adult aged 19 years or older who has certain medical conditions and has not been previously immunized should receive 1 dose of PCV13 vaccine. This PCV13 should be followed with a dose of pneumococcal polysaccharide (PPSV23) vaccine. The PPSV23 vaccine dose should be obtained at least 8 weeks after the dose of PCV13 vaccine. An adult aged 19 years or older who has certain medical conditions and previously received 1 or more doses of PPSV23 vaccine should receive 1 dose of PCV13. The PCV13 vaccine dose should be obtained 1 or more years after the last PPSV23 vaccine dose.    Pneumococcal polysaccharide (PPSV23) vaccine. When PCV13 is also indicated, PCV13 should be obtained first. All adults aged 65 years and older should be immunized. An adult younger than age 65 years who has certain medical conditions should be immunized. Any person who resides in a nursing home or long-term care facility should be immunized. An adult smoker should be immunized. People with an  immunocompromised condition and certain other conditions should receive both PCV13 and PPSV23 vaccines. People with human immunodeficiency virus (HIV) infection should be immunized as soon as possible after diagnosis. Immunization during chemotherapy or radiation therapy should be avoided. Routine use of PPSV23 vaccine is not recommended for American Indians, [place] Natives, or people younger than 65 years unless there are medical conditions that require PPSV23 vaccine. When indicated, people who have unknown   immunization and have no record of immunization should receive PPSV23 vaccine. One-time revaccination 5 years after the first dose of PPSV23 is recommended for people aged 19-64 years who have chronic kidney failure, nephrotic syndrome, asplenia, or immunocompromised conditions. People who received 1-2 doses of PPSV23 before age 65 years should receive another dose of PPSV23 vaccine at age 65 years or later if at least 5 years have passed since the previous dose. Doses of PPSV23 are not needed for people immunized with PPSV23 at or after age 65 years.   Preventive Services / Frequency  Ages 65 years and over  Blood pressure check.  Lipid and cholesterol check.  Lung cancer screening. / Every year if you are aged 55-80 years and have a 30-pack-year history of smoking and currently smoke or have quit within the past 15 years. Yearly screening is stopped once you have quit smoking for at least 15 years or develop a health problem that would prevent you from having lung cancer treatment.  Clinical breast exam.** / Every year after age 40 years.  BRCA-related cancer risk assessment.** / For women who have family members with a BRCA-related cancer (breast, ovarian, tubal, or peritoneal cancers).  Mammogram.** / Every year beginning at age 40 years and continuing for as long as you are in good health. Consult with your health care provider.  Pap test.** / Every 3 years starting at age 30 years  through age 65 or 70 years with 3 consecutive normal Pap tests. Testing can be stopped between 65 and 70 years with 3 consecutive normal Pap tests and no abnormal Pap or HPV tests in the past 10 years.  Fecal occult blood test (FOBT) of stool. / Every year beginning at age 50 years and continuing until age 75 years. You may not need to do this test if you get a colonoscopy every 10 years.  Flexible sigmoidoscopy or colonoscopy.** / Every 5 years for a flexible sigmoidoscopy or every 10 years for a colonoscopy beginning at age 50 years and continuing until age 75 years.  Hepatitis C blood test.** / For all people born from 1945 through 1965 and any individual with known risks for hepatitis C.  Osteoporosis screening.** / A one-time screening for women ages 65 years and over and women at risk for fractures or osteoporosis.  Skin self-exam. / Monthly.  Influenza vaccine. / Every year.  Tetanus, diphtheria, and acellular pertussis (Tdap/Td) vaccine.** / 1 dose of Td every 10 years.  Zoster vaccine.** / 1 dose for adults aged 60 years or older.  Pneumococcal 13-valent conjugate (PCV13) vaccine.** / Consult your health care provider.  Pneumococcal polysaccharide (PPSV23) vaccine.** / 1 dose for all adults aged 65 years and older. Screening for abdominal aortic aneurysm (AAA)  by ultrasound is recommended for people who have history of high blood pressure or who are current or former smokers. 

## 2015-03-27 LAB — HEMOGLOBIN A1C
Hgb A1c MFr Bld: 5.8 % — ABNORMAL HIGH (ref <5.7)
Mean Plasma Glucose: 120 mg/dL — ABNORMAL HIGH (ref <117)

## 2015-03-27 LAB — VITAMIN D 25 HYDROXY (VIT D DEFICIENCY, FRACTURES): Vit D, 25-Hydroxy: 37 ng/mL (ref 30–100)

## 2015-03-27 LAB — TSH: TSH: 2.737 u[IU]/mL (ref 0.350–4.500)

## 2015-03-27 LAB — INSULIN, RANDOM: Insulin: 4.8 u[IU]/mL (ref 2.0–19.6)

## 2015-04-08 ENCOUNTER — Other Ambulatory Visit (HOSPITAL_COMMUNITY): Payer: Self-pay | Admitting: *Deleted

## 2015-04-09 ENCOUNTER — Encounter (HOSPITAL_COMMUNITY)
Admission: RE | Admit: 2015-04-09 | Discharge: 2015-04-09 | Disposition: A | Discharge status: Home / Self Care | Payer: Medicare Other | Source: Ambulatory Visit | Attending: Nephrology | Admitting: Nephrology

## 2015-04-09 DIAGNOSIS — N183 Chronic kidney disease, stage 3 (moderate): Secondary | ICD-10-CM | POA: Diagnosis not present

## 2015-04-09 DIAGNOSIS — D638 Anemia in other chronic diseases classified elsewhere: Secondary | ICD-10-CM | POA: Diagnosis not present

## 2015-04-09 LAB — RENAL FUNCTION PANEL
Albumin: 3.5 g/dL (ref 3.5–5.0)
Anion gap: 8 (ref 5–15)
BUN: 58 mg/dL — ABNORMAL HIGH (ref 6–20)
CALCIUM: 10 mg/dL (ref 8.9–10.3)
CO2: 19 mmol/L — AB (ref 22–32)
CREATININE: 3.75 mg/dL — AB (ref 0.44–1.00)
Chloride: 114 mmol/L — ABNORMAL HIGH (ref 101–111)
GFR calc non Af Amer: 11 mL/min — ABNORMAL LOW (ref >60)
GFR, EST AFRICAN AMERICAN: 13 mL/min — AB (ref >60)
GLUCOSE: 105 mg/dL — AB (ref 65–99)
Phosphorus: 4.1 mg/dL (ref 2.5–4.6)
Potassium: 4.9 mmol/L (ref 3.5–5.1)
Sodium: 141 mmol/L (ref 135–145)

## 2015-04-09 LAB — IRON AND TIBC
IRON: 79 ug/dL (ref 28–170)
SATURATION RATIOS: 33 % — AB (ref 10.4–31.8)
TIBC: 242 ug/dL — ABNORMAL LOW (ref 250–450)
UIBC: 163 ug/dL

## 2015-04-09 LAB — POCT HEMOGLOBIN-HEMACUE: HEMOGLOBIN: 10.4 g/dL — AB (ref 12.0–15.0)

## 2015-04-09 LAB — FERRITIN: FERRITIN: 478 ng/mL — AB (ref 11–307)

## 2015-04-09 MED ORDER — EPOETIN ALFA 20000 UNIT/ML IJ SOLN
20000.0000 [IU] | INTRAMUSCULAR | Status: DC
  Administered 2015-04-09: 20000 [IU] via SUBCUTANEOUS

## 2015-04-09 MED ORDER — EPOETIN ALFA 20000 UNIT/ML IJ SOLN
INTRAMUSCULAR | Status: AC
  Filled 2015-04-09: qty 1

## 2015-04-10 LAB — PTH, INTACT AND CALCIUM
Calcium, Total (PTH): 9.8 mg/dL (ref 8.7–10.3)
PTH: 350 pg/mL — AB (ref 15–65)

## 2015-04-23 ENCOUNTER — Encounter: Payer: Self-pay | Admitting: Cardiology

## 2015-04-23 ENCOUNTER — Ambulatory Visit (INDEPENDENT_AMBULATORY_CARE_PROVIDER_SITE_OTHER): Payer: Medicare Other | Admitting: Cardiology

## 2015-04-23 ENCOUNTER — Other Ambulatory Visit: Payer: Self-pay | Admitting: *Deleted

## 2015-04-23 VITALS — BP 182/90 | Ht 61.5 in | Wt 133.1 lb

## 2015-04-23 DIAGNOSIS — Z01810 Encounter for preprocedural cardiovascular examination: Secondary | ICD-10-CM

## 2015-04-23 DIAGNOSIS — I150 Renovascular hypertension: Secondary | ICD-10-CM | POA: Diagnosis not present

## 2015-04-23 MED ORDER — HYDRALAZINE HCL 10 MG PO TABS: 20.0000 mg | ORAL_TABLET | Freq: Two times a day (BID) | ORAL | Status: DC

## 2015-04-23 NOTE — Patient Instructions (Signed)
Your physician recommends that you schedule a follow-up appointment in: as needed with Dr. Percival Spanish  Start taking Hydralazine 20 mg daily

## 2015-04-23 NOTE — Progress Notes (Signed)
Cardiology Office Note   Date:  04/23/2015   ID:  Eileen Perez, DOB 04-07-1941, MRN [id number]  PCP:  Alesia Richards, MD  Cardiologist:   Minus Breeding, MD   Chief Complaint  Patient presents with  . Pre-op Exam      History of Present Illness: Eileen Perez is a 74 y.o. female who presents for evaluation of multiple cardiovascular risk factors. She is preop for hip surgery. She has no prior cardiac history although she does have risk factors. She is somewhat limited by her knee pain currently. She gets around with a walker.  She does do some yoga and exercising.  She can do her activities of daily living. She denies any cardiovascular symptoms. The patient denies any new symptoms such as chest discomfort, neck or arm discomfort. There has been no new shortness of breath, PND or orthopnea. There have been no reported palpitations, presyncope or syncope.   Past Medical History  Diagnosis Date  . Diabetes mellitus     Pre  . Hypertension   . Arthritis     Osteoarthritis  . Anemia   . History of blood transfusion   . Chronic kidney disease     Chronic renal insufficiency  . Pancreatic cyst 1999  . Thyroid disease     Hyperparathyroid   . GERD (gastroesophageal reflux disease)   . Gout   . Osteopenia   . Diverticulosis   . Hiatal hernia   . Hyperlipidemia     Past Surgical History  Procedure Laterality Date  . Pancreatic cyst excision  1999  . Knee surgery    . Joint replacement  2012    left knee  . Eye surgery Left     cataract extraction with IOL  . Cholecystectomy    . 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 Colonoscopy  03/21/12    Next one in 2018     Current Outpatient Prescriptions  Medication Sig Dispense Refill  . ACCU-CHEK AVIVA PLUS test strip Test once daily 100 each 6  . allopurinol (ZYLOPRIM) 300  MG tablet Take 1/2 to 1 tab daily as directed. 90 tablet 1  . amLODipine (NORVASC) 5 MG tablet     . aspirin 81 MG tablet Take 81 mg by mouth daily.     Marland Kitchen BOOSTRIX 5-2.5-18.5 injection     . Cholecalciferol (HM VITAMIN D3) 4000 UNITS CAPS Take 4,000 Int'l Units by mouth.    . Ferrous Sulfate (SLOW FE PO) Take 45 mg by mouth 2 (two) times daily.    . Flaxseed, Linseed, (FLAXSEED OIL PO) Take 1,000 mg by mouth daily.     Marland Kitchen labetalol (NORMODYNE) 200 MG tablet Take 1 tablet (200 mg total) by mouth 2 (two) times daily. 180 tablet 3  . Multiple Vitamins-Minerals (MULTIVITAMIN WITH MINERALS) tablet Take 1 tablet by mouth daily.    . NON FORMULARY every 28 (twenty-eight) days. Iron Injection    . Omega-3 Fatty Acids (FISH OIL) 1000 MG CAPS Take 1,000 mg by mouth.    . Red Yeast Rice Extract (RED YEAST RICE PO) Take 1,200 tablets by mouth daily.     . Colesevelam HCl (WELCHOL) 3.75 G PACK Take 3.75 each by mouth daily.     Marland Kitchen HYDROcodone-acetaminophen (NORCO/VICODIN) 5-325 MG per tablet Take 1 tablet by mouth as needed.  0  . PREVNAR  13 SUSP injection   0  . [DISCONTINUED] diltiazem (CARDIZEM) 120 MG tablet Take 120 mg by mouth 2 (two) times daily.     No current facility-administered medications for this visit.    Allergies:   Ace inhibitors and Nsaids    Social History:  The patient  reports that she has never smoked. She has never used smokeless tobacco. She reports that she does not drink alcohol or use illicit drugs.   Family History:  The patient's family history includes Cancer in her father and mother; Diabetes in her mother; Hypertension in her mother.    ROS:  Please see the history of present illness.   Otherwise, review of systems are positive for none.   All other systems are reviewed and negative.    PHYSICAL EXAM: VS:  BP 182/90 mmHg  Ht 5' 1.5" (1.562 m)  Wt 133 lb 1.6 oz (60.374 kg)  BMI 24.75 kg/m2 , BMI Body mass index is 24.75 kg/(m^2). GENERAL:  Well appearing HEENT:   Pupils equal round and reactive, fundi not visualized, oral mucosa unremarkable NECK:  No jugular venous distention, waveform within normal limits, carotid upstroke brisk and symmetric, no bruits, no thyromegaly LYMPHATICS:  No cervical, inguinal adenopathy LUNGS:  Clear to auscultation bilaterally BACK:  No CVA tenderness CHEST:  Unremarkable HEART:  PMI not displaced or sustained,S1 and S2 within normal limits, no S3, no S4, no clicks, no rubs, no murmurs ABD:  Flat, positive bowel sounds normal in frequency in pitch, no bruits, no rebound, no guarding, no midline pulsatile mass, no hepatomegaly, no splenomegaly EXT:  2 plus pulses throughout, no edema, no cyanosis no clubbing SKIN:  No rashes no nodules NEURO:  Cranial nerves II through XII grossly intact, motor grossly intact throughout PSYCH:  Cognitively intact, oriented to person place and time    EKG:  EKG is ordered today. The ekg ordered today demonstrates sinus rhythm, rate 66, axis within normal limits, intervals within normal limits, no acute ST-T wave changes.   Recent Labs: 08/12/2014: Platelets 189 03/26/2015: ALT 14; Magnesium 2.1; TSH 2.737 04/09/2015: BUN 58*; Creatinine 3.75*; Hemoglobin 10.4*; Potassium 4.9; Sodium 141    Lipid Panel    Component Value Date/Time   CHOL 218* 03/26/2015 1127   TRIG 78 03/26/2015 1127   HDL 81 03/26/2015 1127   CHOLHDL 2.7 03/26/2015 1127   VLDL 16 03/26/2015 1127   LDLCALC 121* 03/26/2015 1127      Wt Readings from Last 3 Encounters:  04/23/15 133 lb 1.6 oz (60.374 kg)  03/26/15 133 lb (60.328 kg)  08/12/14 134 lb (60.782 kg)      Other studies Reviewed: Additional studies/ records that were reviewed today include: Labs and office records. Review of the above records demonstrates:  Please see elsewhere in the note.     ASSESSMENT AND PLAN:  PREOP:  The patient has no high-risk physical findings or cortical findings. She is not going for a high-risk surgery from his  cardiovascular standpoint. Her functional level is moderate being only limited by her orthopedic problems. Based on all of this, according to ACC/AHA guidelines, no further cardiovascular testing is suggested. The patient would be at acceptable risk for the planned procedure.  HTN: Her blood pressures are still not well controlled despite the recent addition of Norvasc. She has a little bit of swelling from this so I will go up on his dose. She has significant renal insufficiency and so some medications are excluded. I will plan to add  hydralazine 20 mg twice daily. She was given instructions on how to keep a blood pressure diary. She will follow-up with Alesia Richards, MD    Current medicines are reviewed at length with the patient today.  The patient does not have concerns regarding medicines.  The following changes have been made:  As above  Labs/ tests ordered today include:  No orders of the defined types were placed in this encounter.     Disposition:   FU with me as needed.     Signed, Minus Breeding, MD  04/23/2015 8:49 AM    [facility]

## 2015-05-06 ENCOUNTER — Telehealth: Payer: Self-pay | Admitting: Internal Medicine

## 2015-05-06 NOTE — Telephone Encounter (Signed)
Patient called to find out who would order home health assessment before Hip Surgery.  Advised that [facility] will order home health assessment, please contact their office to have this scheduled.  Thank you, Leonie Douglas Referral Coordinator  [facility] [phone] ext. [phone] Fax [phone]

## 2015-05-07 ENCOUNTER — Encounter (HOSPITAL_COMMUNITY)
Admission: RE | Admit: 2015-05-07 | Discharge: 2015-05-07 | Disposition: A | Discharge status: Home / Self Care | Payer: Medicare Other | Source: Ambulatory Visit | Attending: Nephrology | Admitting: Nephrology

## 2015-05-07 DIAGNOSIS — N183 Chronic kidney disease, stage 3 (moderate): Secondary | ICD-10-CM | POA: Diagnosis not present

## 2015-05-07 DIAGNOSIS — D631 Anemia in chronic kidney disease: Secondary | ICD-10-CM | POA: Diagnosis not present

## 2015-05-07 LAB — RENAL FUNCTION PANEL
ANION GAP: 7 (ref 5–15)
Albumin: 3.3 g/dL — ABNORMAL LOW (ref 3.5–5.0)
BUN: 78 mg/dL — AB (ref 6–20)
CO2: 19 mmol/L — ABNORMAL LOW (ref 22–32)
CREATININE: 3.85 mg/dL — AB (ref 0.44–1.00)
Calcium: 10.2 mg/dL (ref 8.9–10.3)
Chloride: 114 mmol/L — ABNORMAL HIGH (ref 101–111)
GFR calc Af Amer: 12 mL/min — ABNORMAL LOW (ref >60)
GFR calc non Af Amer: 11 mL/min — ABNORMAL LOW (ref >60)
GLUCOSE: 112 mg/dL — AB (ref 65–99)
Phosphorus: 4.1 mg/dL (ref 2.5–4.6)
Potassium: 5.1 mmol/L (ref 3.5–5.1)
SODIUM: 140 mmol/L (ref 135–145)

## 2015-05-07 LAB — IRON AND TIBC
Iron: 93 ug/dL (ref 28–170)
Saturation Ratios: 38 % — ABNORMAL HIGH (ref 10.4–31.8)
TIBC: 245 ug/dL — ABNORMAL LOW (ref 250–450)
UIBC: 152 ug/dL

## 2015-05-07 LAB — FERRITIN: FERRITIN: 436 ng/mL — AB (ref 11–307)

## 2015-05-07 LAB — POCT HEMOGLOBIN-HEMACUE: HEMOGLOBIN: 10.9 g/dL — AB (ref 12.0–15.0)

## 2015-05-07 MED ORDER — EPOETIN ALFA 20000 UNIT/ML IJ SOLN: 20000.0000 [IU] | INTRAMUSCULAR | Status: DC

## 2015-05-07 MED ORDER — EPOETIN ALFA 20000 UNIT/ML IJ SOLN
INTRAMUSCULAR | Status: AC
  Administered 2015-05-07: 20000 [IU] via SUBCUTANEOUS
  Filled 2015-05-07: qty 1

## 2015-05-08 LAB — PTH, INTACT AND CALCIUM
Calcium, Total (PTH): 9.9 mg/dL (ref 8.7–10.3)
PTH: 340 pg/mL — AB (ref 15–65)

## 2015-05-13 ENCOUNTER — Ambulatory Visit: Payer: Self-pay | Admitting: Orthopedic Surgery

## 2015-05-13 NOTE — Progress Notes (Signed)
Preoperative surgical orders have been place into the Epic hospital system for Eileen Perez on 05/13/2015, 10:35 AM  by Mickel Crow for surgery on 05-26-2015.  Preop Total Hip - Anterior Approach orders including IV Tylenol, and IV Decadron as long as there are no contraindications to the above medications. Arlee Muslim, PA-C

## 2015-05-19 ENCOUNTER — Encounter (HOSPITAL_COMMUNITY): Payer: Self-pay

## 2015-05-19 ENCOUNTER — Encounter (HOSPITAL_COMMUNITY)
Admission: RE | Admit: 2015-05-19 | Discharge: 2015-05-19 | Disposition: A | Discharge status: Home / Self Care | Payer: Medicare Other | Source: Ambulatory Visit | Attending: Orthopedic Surgery | Admitting: Orthopedic Surgery

## 2015-05-19 ENCOUNTER — Ambulatory Visit (HOSPITAL_COMMUNITY)
Admission: RE | Admit: 2015-05-19 | Discharge: 2015-05-19 | Disposition: A | Discharge status: Home / Self Care | Payer: Medicare Other | Source: Ambulatory Visit | Attending: Anesthesiology | Admitting: Anesthesiology

## 2015-05-19 DIAGNOSIS — Z01810 Encounter for preprocedural cardiovascular examination: Secondary | ICD-10-CM | POA: Diagnosis not present

## 2015-05-19 DIAGNOSIS — Z01818 Encounter for other preprocedural examination: Secondary | ICD-10-CM | POA: Diagnosis not present

## 2015-05-19 DIAGNOSIS — R918 Other nonspecific abnormal finding of lung field: Secondary | ICD-10-CM | POA: Diagnosis not present

## 2015-05-19 LAB — URINALYSIS, ROUTINE W REFLEX MICROSCOPIC
Bilirubin Urine: NEGATIVE
Glucose, UA: NEGATIVE mg/dL
Hgb urine dipstick: NEGATIVE
KETONES UR: NEGATIVE mg/dL
Leukocytes, UA: NEGATIVE
NITRITE: NEGATIVE
PH: 5.5 (ref 5.0–8.0)
Protein, ur: >300 mg/dL — AB
SPECIFIC GRAVITY, URINE: 1.011 (ref 1.005–1.030)
Urobilinogen, UA: 0.2 mg/dL (ref 0.0–1.0)

## 2015-05-19 LAB — SURGICAL PCR SCREEN
MRSA, PCR: NEGATIVE
Staphylococcus aureus: NEGATIVE

## 2015-05-19 LAB — COMPREHENSIVE METABOLIC PANEL
ALT: 22 U/L (ref 14–54)
ANION GAP: 9 (ref 5–15)
AST: 26 U/L (ref 15–41)
Albumin: 3.6 g/dL (ref 3.5–5.0)
Alkaline Phosphatase: 162 U/L — ABNORMAL HIGH (ref 38–126)
BILIRUBIN TOTAL: 0.7 mg/dL (ref 0.3–1.2)
BUN: 53 mg/dL — AB (ref 6–20)
CHLORIDE: 110 mmol/L (ref 101–111)
CO2: 22 mmol/L (ref 22–32)
Calcium: 10.2 mg/dL (ref 8.9–10.3)
Creatinine, Ser: 3.86 mg/dL — ABNORMAL HIGH (ref 0.44–1.00)
GFR, EST AFRICAN AMERICAN: 12 mL/min — AB (ref >60)
GFR, EST NON AFRICAN AMERICAN: 10 mL/min — AB (ref >60)
GLUCOSE: 113 mg/dL — AB (ref 65–99)
Potassium: 6 mmol/L — ABNORMAL HIGH (ref 3.5–5.1)
Sodium: 141 mmol/L (ref 135–145)
Total Protein: 6.9 g/dL (ref 6.5–8.1)

## 2015-05-19 LAB — URINE MICROSCOPIC-ADD ON

## 2015-05-19 LAB — CBC
HEMATOCRIT: 36.3 % (ref 36.0–46.0)
HEMOGLOBIN: 11 g/dL — AB (ref 12.0–15.0)
MCH: 28.6 pg (ref 26.0–34.0)
MCHC: 30.3 g/dL (ref 30.0–36.0)
MCV: 94.5 fL (ref 78.0–100.0)
PLATELETS: 194 10*3/uL (ref 150–400)
RBC: 3.84 MIL/uL — ABNORMAL LOW (ref 3.87–5.11)
RDW: 16.2 % — ABNORMAL HIGH (ref 11.5–15.5)
WBC: 4.5 10*3/uL (ref 4.0–10.5)

## 2015-05-19 LAB — APTT: aPTT: 34 seconds (ref 24–37)

## 2015-05-19 LAB — PROTIME-INR
INR: 1.06 (ref 0.00–1.49)
PROTHROMBIN TIME: 14 s (ref 11.6–15.2)

## 2015-05-19 NOTE — Progress Notes (Signed)
CMP,urinalysis and micro results in epic per PAT visit 05/19/2015 sent to Dr Wynelle Link

## 2015-05-19 NOTE — Patient Instructions (Signed)
Eileen Perez  05/19/2015   Your procedure is scheduled on: Wednesday May 26, 2015  Report to [facility] Main  Entrance take [place]  elevators to 3rd floor to  [place] at 6:30 AM.  Call this number if you have problems the morning of surgery [phone]   Remember: ONLY 1 PERSON MAY GO WITH YOU TO SHORT STAY TO GET  READY MORNING OF [place].  Do not eat food or drink liquids :After Midnight.     Take these medicines the morning of surgery with A SIP OF WATER: Allopurinol; Hydralazine (Apresoline); Hydrocodone-Acetaminophen if needed; Labetalol (Normodyne)                               You may not have any metal on your body including hair pins and              piercings  Do not wear jewelry, make-up, lotions, powders or perfumes, deodorant             Do not wear nail polish.  Do not shave  48 hours prior to surgery.              Do not bring valuables to the hospital. [place].  Contacts, dentures or bridgework may not be worn into surgery.  Leave suitcase in the car. After surgery it may be brought to your room.   Special Instructions: DO NOT REMOVE BLUE BLOOD BAND PRIOR TO SURGERY              Please read over the following fact sheets you were given:MRSA INFORMATION SHEET;INCENTIVE SPIROMETER; BLOOD TRANSFUSION FACT SHEET  _____________________________________________________________________             [facility] - Preparing for Surgery Before surgery, you can play an important role.  Because skin is not sterile, your skin needs to be as free of germs as possible.  You can reduce the number of germs on your skin by washing with CHG (chlorahexidine gluconate) soap before surgery.  CHG is an antiseptic cleaner which kills germs and bonds with the skin to continue killing germs even after washing. Please DO NOT use if you have an allergy to CHG or antibacterial soaps.  If your skin becomes  reddened/irritated stop using the CHG and inform your nurse when you arrive at Short Stay. Do not shave (including legs and underarms) for at least 48 hours prior to the first CHG shower.  You may shave your face/neck. Please follow these instructions carefully:  1.  Shower with CHG Soap the night before surgery and the  morning of Surgery.  2.  If you choose to wash your hair, wash your hair first as usual with your  normal  shampoo.  3.  After you shampoo, rinse your hair and body thoroughly to remove the  shampoo.                           4.  Use CHG as you would any other liquid soap.  You can apply chg directly  to the skin and wash  Gently with a scrungie or clean washcloth.  5.  Apply the CHG Soap to your body ONLY FROM THE NECK DOWN.   Do not use on face/ open                           Wound or open sores. Avoid contact with eyes, ears mouth and genitals (private parts).                       Wash face,  Genitals (private parts) with your normal soap.             6.  Wash thoroughly, paying special attention to the area where your surgery  will be performed.  7.  Thoroughly rinse your body with warm water from the neck down.  8.  DO NOT shower/wash with your normal soap after using and rinsing off  the CHG Soap.                9.  Pat yourself dry with a clean towel.            10.  Wear clean pajamas.            11.  Place clean sheets on your bed the night of your first shower and do not  sleep with pets. Day of Surgery : Do not apply any lotions/deodorants the morning of surgery.  Please wear clean clothes to the hospital/surgery center.  FAILURE TO FOLLOW THESE INSTRUCTIONS MAY RESULT IN THE CANCELLATION OF YOUR SURGERY PATIENT SIGNATURE_________________________________  NURSE SIGNATURE__________________________________  ________________________________________________________________________   Adam Phenix  An incentive spirometer is a tool that  can help keep your lungs clear and active. This tool measures how well you are filling your lungs with each breath. Taking long deep breaths may help reverse or decrease the chance of developing breathing (pulmonary) problems (especially infection) following:  A long period of time when you are unable to move or be active. BEFORE THE PROCEDURE   If the spirometer includes an indicator to show your best effort, your nurse or respiratory therapist will set it to a desired goal.  If possible, sit up straight or lean slightly forward. Try not to slouch.  Hold the incentive spirometer in an upright position. INSTRUCTIONS FOR USE   Sit on the edge of your bed if possible, or sit up as far as you can in bed or on a chair.  Hold the incentive spirometer in an upright position.  Breathe out normally.  Place the mouthpiece in your mouth and seal your lips tightly around it.  Breathe in slowly and as deeply as possible, raising the piston or the ball toward the top of the column.  Hold your breath for 3-5 seconds or for as long as possible. Allow the piston or ball to fall to the bottom of the column.  Remove the mouthpiece from your mouth and breathe out normally.  Rest for a few seconds and repeat Steps 1 through 7 at least 10 times every 1-2 hours when you are awake. Take your time and take a few normal breaths between deep breaths.  The spirometer may include an indicator to show your best effort. Use the indicator as a goal to work toward during each repetition.  After each set of 10 deep breaths, practice coughing to be sure your lungs are clear. If you have an incision (the cut made at the time of surgery),  support your incision when coughing by placing a pillow or rolled up towels firmly against it. Once you are able to get out of bed, walk around indoors and cough well. You may stop using the incentive spirometer when instructed by your caregiver.  RISKS AND COMPLICATIONS  Take your  time so you do not get dizzy or light-headed.  If you are in pain, you may need to take or ask for pain medication before doing incentive spirometry. It is harder to take a deep breath if you are having pain. AFTER USE  Rest and breathe slowly and easily.  It can be helpful to keep track of a log of your progress. Your caregiver can provide you with a simple table to help with this. If you are using the spirometer at home, follow these instructions: [place] IF:   You are having difficultly using the spirometer.  You have trouble using the spirometer as often as instructed.  Your pain medication is not giving enough relief while using the spirometer.  You develop fever of 100.5 F (38.1 C) or higher. SEEK IMMEDIATE MEDICAL CARE IF:   You cough up bloody sputum that had not been present before.  You develop fever of 102 F (38.9 C) or greater.  You develop worsening pain at or near the incision site. MAKE SURE YOU:   Understand these instructions.  Will watch your condition.  Will get help right away if you are not doing well or get worse. Document Released: 03/19/2007 Document Revised: 01/29/2012 Document Reviewed: 05/20/2007 ExitCare Patient Information 2014 ExitCare, [place].   ________________________________________________________________________  WHAT IS A BLOOD TRANSFUSION? Blood Transfusion Information  A transfusion is the replacement of blood or some of its parts. Blood is made up of multiple cells which provide different functions.  Red blood cells carry oxygen and are used for blood loss replacement.  White blood cells fight against infection.  Platelets control bleeding.  Plasma helps clot blood.  Other blood products are available for specialized needs, such as hemophilia or other clotting disorders. BEFORE THE TRANSFUSION  Who gives blood for transfusions?   Healthy volunteers who are fully evaluated to make sure their blood is safe. This  is blood bank blood. Transfusion therapy is the safest it has ever been in the practice of medicine. Before blood is taken from a donor, a complete history is taken to make sure that person has no history of diseases nor engages in risky social behavior (examples are intravenous drug use or sexual activity with multiple partners). The donor's travel history is screened to minimize risk of transmitting infections, such as malaria. The donated blood is tested for signs of infectious diseases, such as HIV and hepatitis. The blood is then tested to be sure it is compatible with you in order to minimize the chance of a transfusion reaction. If you or a relative donates blood, this is often done in anticipation of surgery and is not appropriate for emergency situations. It takes many days to process the donated blood. RISKS AND COMPLICATIONS Although transfusion therapy is very safe and saves many lives, the main dangers of transfusion include:   Getting an infectious disease.  Developing a transfusion reaction. This is an allergic reaction to something in the blood you were given. Every precaution is taken to prevent this. The decision to have a blood transfusion has been considered carefully by your caregiver before blood is given. Blood is not given unless the benefits outweigh the risks. AFTER THE TRANSFUSION  Right after receiving a blood transfusion, you will usually feel much better and more energetic. This is especially true if your red blood cells have gotten low (anemic). The transfusion raises the level of the red blood cells which carry oxygen, and this usually causes an energy increase.  The nurse administering the transfusion will monitor you carefully for complications. HOME CARE INSTRUCTIONS  No special instructions are needed after a transfusion. You may find your energy is better. Speak with your caregiver about any limitations on activity for underlying diseases you may have. SEEK  MEDICAL CARE IF:   Your condition is not improving after your transfusion.  You develop redness or irritation at the intravenous (IV) site. SEEK IMMEDIATE MEDICAL CARE IF:  Any of the following symptoms occur over the next 12 hours:  Shaking chills.  You have a temperature by mouth above 102 F (38.9 C), not controlled by medicine.  Chest, back, or muscle pain.  People around you feel you are not acting correctly or are confused.  Shortness of breath or difficulty breathing.  Dizziness and fainting.  You get a rash or develop hives.  You have a decrease in urine output.  Your urine turns a dark color or changes to pink, red, or brown. Any of the following symptoms occur over the next 10 days:  You have a temperature by mouth above 102 F (38.9 C), not controlled by medicine.  Shortness of breath.  Weakness after normal activity.  The white part of the eye turns yellow (jaundice).  You have a decrease in the amount of urine or are urinating less often.  Your urine turns a dark color or changes to pink, red, or brown. Document Released: 11/03/2000 Document Revised: 01/29/2012 Document Reviewed: 06/22/2008 [facility] Patient Information 2014 [place], [place].  _______________________________________________________________________

## 2015-05-19 NOTE — Progress Notes (Signed)
Contacted Dr Hubbard Robinson in regards to pts H&P, note per [facility]. Discussed creatinine and BUN results per 05/07/2015 / epic. Anesthesia to see pt day of surgery.  EKG per epic 04/23/2015  Surgical clearance per Dr Percival Spanish 04/23/2015  OV note per [facility] on chart 03/22/2015

## 2015-05-20 ENCOUNTER — Ambulatory Visit: Payer: Self-pay | Admitting: Orthopedic Surgery

## 2015-05-20 NOTE — H&P (Signed)
Eileen Perez DOB: 1941/02/02 Divorced / Language: English / Race: Black or African American Female Date of Admission:  05/26/2015 CC:  Right Hip Pain History of Present Illness The patient is a 74 year old female who comes in for a preoperative History and Physical. The patient is scheduled for a right total hip arthroplasty (anterior) to be performed by Dr. Dione Plover. Aluisio, MD at [facility] on 05-26-2015. The patient is a 74 year old female who has been followed for their right hip. The patient is being followed for their right hip pain and osteoarthritis. Eileen Perez states that right hip has been bothering her for over a year now. It has been progressive in nature. We had seen her last summer and noted that she had significant arthritis. Richardson Landry saw her again about a month ago, when she had progressively worsening pain and dysfunction thus she was set up for this hip replacement surgery. Hip is hurting at all times, day and night. It is something which she can and cannot do. She is eagerly anticipating the surgery. When she gets discomfort it is mainly in the right thigh, going up to her groin. She said she has good days and bad days. She is ready to proceed with surgical fixation of the right hip at this time.  They have been treated conservatively in the past for the above stated problem and despite conservative measures, they continue to have progressive pain and severe functional limitations and dysfunction. They have failed non-operative management including home exercise, medications. It is felt that they would benefit from undergoing total joint replacement. Risks and benefits of the procedure have been discussed with the patient and they elect to proceed with surgery. There are no active contraindications to surgery such as ongoing infection or rapidly progressive neurological disease. She does have chronic kidney disease but has been seen recently.  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 never smoker Drug/Alcohol Rehab (Currently) no Pain Contract no Drug/Alcohol Rehab (Previously) no Number of flights of stairs before winded greater than 5 Marital status divorced Living situation live alone Advance Directives Living Will, Healthcare POA [place]  Medication History  Aspirin EC (81MG  Tablet DR, Oral) Active. Red Yeast Rice (Oral) Specific dose unknown - Active. (1200mg  BID) Labetalol HCl (200MG  Tablet, Oral) Active. Allopurinol (300MG  Tablet, Oral) Active. Flaxseed Oil (1000MG  Capsule, Oral) Active. Zinc 15 (Oral) Specific dose unknown - Active. Multi Vitamin/Minerals (Oral) Active. Calcium (Oral) Specific dose unknown - Active. Fish Oil Active. Stool Softener (100MG  Capsule, Oral) Active. Vitamin D3 (2000UNIT Capsule, Oral) Active. Slow Fe (142 (45 Fe)MG Tablet ER, Oral) Active. HydrALAZINE HCl (10MG  Tablet, 2 tabs Oral twice a day) Active. Amlodipine Active. Norvasc (5MG  Tablet, 1/2 tablet Oral once daily at night) Active.  Past Surgical History Cataract Surgery left Gallbladder Surgery Date: 1999. open, Pancreatic Cyst Removal Total Knee Replacement left Total Knee Replacement - Right  Review of Systems General Not Present- Chills, Fatigue, Fever,  Memory  Loss, Night Sweats, Weight Gain and Weight Loss. Skin Not Present- Eczema, Hives, Itching, Lesions and Rash. HEENT Not Present- Dentures, Double Vision, Headache, Hearing Loss, Tinnitus and Visual Loss. Respiratory Not Present- Allergies, Chronic Cough, Coughing up blood, Shortness of breath at rest and Shortness of breath with exertion. Cardiovascular Not Present- Chest Pain, Difficulty Breathing Lying Down, Murmur, Palpitations, Racing/skipping heartbeats and Swelling. Gastrointestinal Not Present- Abdominal Pain, Bloody Stool, Constipation, Diarrhea, Difficulty Swallowing, Heartburn, Jaundice, Loss of appetitie, Nausea and Vomiting. Female Genitourinary Present- Urinating at Night. Not Present- Blood in Urine, Discharge, Flank Pain, Incontinence, Painful Urination, Urgency, Urinary frequency, Urinary Retention and Weak urinary stream. Musculoskeletal Present- Joint Pain, Morning Stiffness and Muscle Weakness. Not Present- Back Pain, Joint Swelling, Muscle Pain and Spasms. Neurological Not Present- Blackout spells, Difficulty with balance, Dizziness, Paralysis, Tremor and Weakness. Psychiatric Not Present- Insomnia.  Vitals Weight: 134 lb Height: 62in Body Surface Area: 1.61 m Body Mass Index: 24.51 kg/m  BP: 172/92 (Sitting, Right Arm, Standard)  Physical Exam General Mental Status -Alert, cooperative and good historian. General Appearance-pleasant, Not in acute distress. Orientation-Oriented X3. [place], Well nourished and Well developed. Note: petite  Head and Neck Head-normocephalic, atraumatic . Neck Global Assessment - supple, no bruit auscultated on the right, no bruit auscultated on the left.  Eye Pupil - Bilateral-Regular and Round. Motion - Bilateral-EOMI.  Chest and Lung Exam Auscultation Breath sounds - clear at anterior chest wall and clear at posterior chest wall. Adventitious sounds - No Adventitious  sounds.  Cardiovascular Auscultation Rhythm - Regular rate and rhythm. Heart Sounds - S1 WNL and S2 WNL. Murmurs & Other Heart Sounds - Auscultation of the heart reveals - No Murmurs.  Abdomen Palpation/Percussion Tenderness - Abdomen is non-tender to palpation. Rigidity (guarding) - Abdomen is soft. Auscultation Auscultation of the abdomen reveals - Bowel sounds normal.  Female Genitourinary Note: Not done, not pertinent to present illness  Musculoskeletal Note: Well developed female in no distress. Right hip is able to be flexed to 90, no internal rotation about 10 to 20, external rotation 10 to 20 of abduction. She is nontender about the hip.  X-rays: Radiographs from last visit, AP pelvis and lateral hip. 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  Assessment & Plan Primary osteoarthritis of right hip (M16.11) Note:Surgical Plans: Right Total Hip Replacement - Anterior Approach  Disposition: Home  PCP: Dr. Unk Pinto Renal: [facility] - Dr. Louis Meckel - "Now we have this situation of possibly needing a fairly majpr surgery in the next couple of months on her hip. I told her she could be at risk of A on CRF necessitating dialysis after that surgery."  IV TXA  Anesthesia Issues: None  Signed electronically by Ok Edwards, III PA-C

## 2015-05-23 NOTE — Anesthesia Preprocedure Evaluation (Addendum)
Anesthesia Evaluation  Patient identified by MRN, date of birth, ID band Patient awake    Reviewed: Allergy & Precautions, NPO status , Patient's Chart, lab work & pertinent test results, reviewed documented beta blocker date and time   Airway Mallampati: II   Neck ROM: Full    Dental  (+) Dental Advisory Given, Teeth Intact   Pulmonary  breath sounds clear to auscultation        Cardiovascular hypertension, Pt. on medications Rhythm:Regular  EKG OK, cleared by cardiology for this procedure   Neuro/Psych    GI/Hepatic hiatal hernia, GERD-  Medicated,  Endo/Other  diabetes, Type 2  Renal/GU Chronic Renal insufficiency IV, Creat 3.86, K 6.0, 4.9 2 weeks ago, will need to recheck morning of surgery      Musculoskeletal   Abdominal (+)  Abdomen: soft.    Peds  Hematology 11/36, INR 1.06,    Anesthesia Other Findings   Reproductive/Obstetrics                            Anesthesia Physical Anesthesia Plan  ASA: III  Anesthesia Plan: Spinal   Post-op Pain Management:    Induction: Intravenous  Airway Management Planned: Nasal Cannula  Additional Equipment:   Intra-op Plan:   Post-operative Plan:   Informed Consent: I have reviewed the patients History and Physical, chart, labs and discussed the procedure including the risks, benefits and alternatives for the proposed anesthesia with the patient or authorized representative who has indicated his/her understanding and acceptance.     Plan Discussed with:   Anesthesia Plan Comments: (Chronic Renal Insufficiency Stage IV, Need to re-check K morning of surgery, was 6.0,  6/29 but was 4.9,  2 weeks ago, No Toradol   Repeat K 5.2, good to go)       Anesthesia Quick Evaluation

## 2015-05-24 ENCOUNTER — Encounter: Payer: Self-pay | Admitting: *Deleted

## 2015-05-26 ENCOUNTER — Inpatient Hospital Stay (HOSPITAL_COMMUNITY): Payer: Medicare Other

## 2015-05-26 ENCOUNTER — Encounter (HOSPITAL_COMMUNITY)
Admission: RE | Disposition: A | Discharge status: Home Health | Payer: Self-pay | Source: Ambulatory Visit | Attending: Orthopedic Surgery

## 2015-05-26 ENCOUNTER — Encounter (HOSPITAL_COMMUNITY): Payer: Self-pay | Admitting: *Deleted

## 2015-05-26 ENCOUNTER — Inpatient Hospital Stay (HOSPITAL_COMMUNITY): Payer: Medicare Other | Admitting: Anesthesiology

## 2015-05-26 ENCOUNTER — Inpatient Hospital Stay (HOSPITAL_COMMUNITY)
Admission: RE | Admit: 2015-05-26 | Discharge: 2015-05-28 | DRG: 470 | Disposition: A | Discharge status: Home Health | Payer: Medicare Other | Source: Ambulatory Visit | Attending: Orthopedic Surgery | Admitting: Orthopedic Surgery

## 2015-05-26 DIAGNOSIS — M8580 Other specified disorders of bone density and structure, unspecified site: Secondary | ICD-10-CM | POA: Diagnosis present

## 2015-05-26 DIAGNOSIS — E213 Hyperparathyroidism, unspecified: Secondary | ICD-10-CM | POA: Diagnosis not present

## 2015-05-26 DIAGNOSIS — M109 Gout, unspecified: Secondary | ICD-10-CM | POA: Diagnosis present

## 2015-05-26 DIAGNOSIS — Z780 Asymptomatic menopausal state: Secondary | ICD-10-CM

## 2015-05-26 DIAGNOSIS — Z96649 Presence of unspecified artificial hip joint: Secondary | ICD-10-CM

## 2015-05-26 DIAGNOSIS — Z96653 Presence of artificial knee joint, bilateral: Secondary | ICD-10-CM | POA: Diagnosis not present

## 2015-05-26 DIAGNOSIS — I129 Hypertensive chronic kidney disease with stage 1 through stage 4 chronic kidney disease, or unspecified chronic kidney disease: Secondary | ICD-10-CM | POA: Diagnosis not present

## 2015-05-26 DIAGNOSIS — E785 Hyperlipidemia, unspecified: Secondary | ICD-10-CM | POA: Diagnosis present

## 2015-05-26 DIAGNOSIS — N184 Chronic kidney disease, stage 4 (severe): Secondary | ICD-10-CM | POA: Diagnosis not present

## 2015-05-26 DIAGNOSIS — K5790 Diverticulosis of intestine, part unspecified, without perforation or abscess without bleeding: Secondary | ICD-10-CM | POA: Diagnosis not present

## 2015-05-26 DIAGNOSIS — M1611 Unilateral primary osteoarthritis, right hip: Secondary | ICD-10-CM | POA: Diagnosis not present

## 2015-05-26 DIAGNOSIS — K219 Gastro-esophageal reflux disease without esophagitis: Secondary | ICD-10-CM | POA: Diagnosis not present

## 2015-05-26 DIAGNOSIS — E1122 Type 2 diabetes mellitus with diabetic chronic kidney disease: Secondary | ICD-10-CM | POA: Diagnosis present

## 2015-05-26 DIAGNOSIS — M169 Osteoarthritis of hip, unspecified: Secondary | ICD-10-CM | POA: Diagnosis not present

## 2015-05-26 DIAGNOSIS — Z8249 Family history of ischemic heart disease and other diseases of the circulatory system: Secondary | ICD-10-CM

## 2015-05-26 DIAGNOSIS — Z7982 Long term (current) use of aspirin: Secondary | ICD-10-CM | POA: Diagnosis not present

## 2015-05-26 DIAGNOSIS — D631 Anemia in chronic kidney disease: Secondary | ICD-10-CM | POA: Diagnosis not present

## 2015-05-26 DIAGNOSIS — Z833 Family history of diabetes mellitus: Secondary | ICD-10-CM | POA: Diagnosis not present

## 2015-05-26 DIAGNOSIS — Z79899 Other long term (current) drug therapy: Secondary | ICD-10-CM

## 2015-05-26 HISTORY — PX: TOTAL HIP ARTHROPLASTY: SHX124

## 2015-05-26 LAB — BASIC METABOLIC PANEL
ANION GAP: 9 (ref 5–15)
BUN: 57 mg/dL — AB (ref 6–20)
CO2: 20 mmol/L — AB (ref 22–32)
CREATININE: 4.11 mg/dL — AB (ref 0.44–1.00)
Calcium: 10.3 mg/dL (ref 8.9–10.3)
Chloride: 112 mmol/L — ABNORMAL HIGH (ref 101–111)
GFR calc Af Amer: 11 mL/min — ABNORMAL LOW (ref >60)
GFR calc non Af Amer: 10 mL/min — ABNORMAL LOW (ref >60)
Glucose, Bld: 123 mg/dL — ABNORMAL HIGH (ref 65–99)
Potassium: 5.2 mmol/L — ABNORMAL HIGH (ref 3.5–5.1)
SODIUM: 141 mmol/L (ref 135–145)

## 2015-05-26 LAB — GLUCOSE, CAPILLARY
Glucose-Capillary: 109 mg/dL — ABNORMAL HIGH (ref 65–99)
Glucose-Capillary: 94 mg/dL (ref 65–99)

## 2015-05-26 LAB — TYPE AND SCREEN
ABO/RH(D): O POS
Antibody Screen: NEGATIVE

## 2015-05-26 SURGERY — ARTHROPLASTY, HIP, TOTAL, ANTERIOR APPROACH
Anesthesia: Spinal | Site: Hip | Laterality: Right

## 2015-05-26 MED ORDER — ACETAMINOPHEN 650 MG RE SUPP: 650.0000 mg | Freq: Four times a day (QID) | RECTAL | Status: DC | PRN

## 2015-05-26 MED ORDER — PROPOFOL 10 MG/ML IV BOLUS
INTRAVENOUS | Status: AC
  Filled 2015-05-26: qty 20

## 2015-05-26 MED ORDER — PROPOFOL INFUSION 10 MG/ML OPTIME
INTRAVENOUS | Status: DC | PRN
  Administered 2015-05-26: 50 ug/kg/min via INTRAVENOUS

## 2015-05-26 MED ORDER — ACETAMINOPHEN 10 MG/ML IV SOLN
INTRAVENOUS | Status: AC
  Filled 2015-05-26: qty 100

## 2015-05-26 MED ORDER — BUPIVACAINE IN DEXTROSE 0.75-8.25 % IT SOLN
INTRATHECAL | Status: DC | PRN
  Administered 2015-05-26: 1.6 mL via INTRATHECAL

## 2015-05-26 MED ORDER — EPHEDRINE SULFATE 50 MG/ML IJ SOLN
INTRAMUSCULAR | Status: DC | PRN
  Administered 2015-05-26 (×3): 5 mg via INTRAVENOUS

## 2015-05-26 MED ORDER — AMLODIPINE BESYLATE 2.5 MG PO TABS
2.5000 mg | ORAL_TABLET | Freq: Every day | ORAL | Status: DC
  Administered 2015-05-27: 2.5 mg via ORAL
  Filled 2015-05-26 (×2): qty 1

## 2015-05-26 MED ORDER — ACETAMINOPHEN 325 MG PO TABS: 650.0000 mg | ORAL_TABLET | Freq: Four times a day (QID) | ORAL | Status: DC | PRN

## 2015-05-26 MED ORDER — PHENOL 1.4 % MT LIQD: 1.0000 | OROMUCOSAL | Status: DC | PRN

## 2015-05-26 MED ORDER — DEXAMETHASONE SODIUM PHOSPHATE 10 MG/ML IJ SOLN
INTRAMUSCULAR | Status: AC
  Filled 2015-05-26: qty 1

## 2015-05-26 MED ORDER — METHOCARBAMOL 500 MG PO TABS
500.0000 mg | ORAL_TABLET | Freq: Once | ORAL | Status: AC
  Administered 2015-05-26: 500 mg via ORAL

## 2015-05-26 MED ORDER — POLYETHYLENE GLYCOL 3350 17 G PO PACK: 17.0000 g | PACK | Freq: Every day | ORAL | Status: DC | PRN

## 2015-05-26 MED ORDER — CHLORHEXIDINE GLUCONATE 4 % EX LIQD: 60.0000 mL | Freq: Once | CUTANEOUS | Status: DC

## 2015-05-26 MED ORDER — OXYCODONE HCL 5 MG PO TABS
5.0000 mg | ORAL_TABLET | ORAL | Status: DC | PRN
  Administered 2015-05-26 (×2): 5 mg via ORAL
  Administered 2015-05-26 – 2015-05-28 (×10): 10 mg via ORAL
  Filled 2015-05-26 (×2): qty 2
  Filled 2015-05-26 (×2): qty 1
  Filled 2015-05-26: qty 2
  Filled 2015-05-26: qty 1
  Filled 2015-05-26 (×3): qty 2
  Filled 2015-05-26: qty 1
  Filled 2015-05-26 (×3): qty 2

## 2015-05-26 MED ORDER — EPHEDRINE SULFATE 50 MG/ML IJ SOLN
INTRAMUSCULAR | Status: AC
  Filled 2015-05-26: qty 1

## 2015-05-26 MED ORDER — TRAMADOL HCL 50 MG PO TABS
50.0000 mg | ORAL_TABLET | Freq: Four times a day (QID) | ORAL | Status: DC | PRN
  Administered 2015-05-27: 100 mg via ORAL
  Filled 2015-05-26: qty 2

## 2015-05-26 MED ORDER — MENTHOL 3 MG MT LOZG: 1.0000 | LOZENGE | OROMUCOSAL | Status: DC | PRN

## 2015-05-26 MED ORDER — MEPERIDINE HCL 50 MG/ML IJ SOLN: 6.2500 mg | INTRAMUSCULAR | Status: DC | PRN

## 2015-05-26 MED ORDER — APIXABAN 2.5 MG PO TABS
2.5000 mg | ORAL_TABLET | Freq: Two times a day (BID) | ORAL | Status: DC
  Administered 2015-05-27 – 2015-05-28 (×3): 2.5 mg via ORAL
  Filled 2015-05-26 (×5): qty 1

## 2015-05-26 MED ORDER — LABETALOL HCL 200 MG PO TABS
200.0000 mg | ORAL_TABLET | Freq: Two times a day (BID) | ORAL | Status: DC
  Administered 2015-05-26 – 2015-05-28 (×4): 200 mg via ORAL
  Filled 2015-05-26 (×5): qty 1

## 2015-05-26 MED ORDER — SODIUM CHLORIDE 0.9 % IJ SOLN
INTRAMUSCULAR | Status: AC
  Filled 2015-05-26: qty 10

## 2015-05-26 MED ORDER — BUPIVACAINE HCL (PF) 0.25 % IJ SOLN
INTRAMUSCULAR | Status: AC
  Filled 2015-05-26: qty 30

## 2015-05-26 MED ORDER — SODIUM CHLORIDE 0.9 % IV SOLN
INTRAVENOUS | Status: DC
  Administered 2015-05-26 – 2015-05-27 (×2): via INTRAVENOUS

## 2015-05-26 MED ORDER — PROPOFOL 10 MG/ML IV BOLUS
INTRAVENOUS | Status: DC | PRN
  Administered 2015-05-26: 50 mg via INTRAVENOUS

## 2015-05-26 MED ORDER — CEFAZOLIN SODIUM-DEXTROSE 2-3 GM-% IV SOLR
2.0000 g | Freq: Four times a day (QID) | INTRAVENOUS | Status: AC
  Administered 2015-05-26 (×2): 2 g via INTRAVENOUS
  Filled 2015-05-26 (×3): qty 50

## 2015-05-26 MED ORDER — DEXAMETHASONE SODIUM PHOSPHATE 10 MG/ML IJ SOLN
10.0000 mg | Freq: Once | INTRAMUSCULAR | Status: AC
  Administered 2015-05-27: 10 mg via INTRAVENOUS
  Filled 2015-05-26: qty 1

## 2015-05-26 MED ORDER — METHOCARBAMOL 1000 MG/10ML IJ SOLN
500.0000 mg | Freq: Once | INTRAVENOUS | Status: AC
  Filled 2015-05-26: qty 5

## 2015-05-26 MED ORDER — FENTANYL CITRATE (PF) 100 MCG/2ML IJ SOLN
INTRAMUSCULAR | Status: DC | PRN
  Administered 2015-05-26: 50 ug via INTRAVENOUS

## 2015-05-26 MED ORDER — BISACODYL 10 MG RE SUPP: 10.0000 mg | Freq: Every day | RECTAL | Status: DC | PRN

## 2015-05-26 MED ORDER — CEFAZOLIN SODIUM-DEXTROSE 2-3 GM-% IV SOLR
2.0000 g | INTRAVENOUS | Status: AC
  Administered 2015-05-26: 2 g via INTRAVENOUS

## 2015-05-26 MED ORDER — METOCLOPRAMIDE HCL 5 MG/ML IJ SOLN: 5.0000 mg | Freq: Three times a day (TID) | INTRAMUSCULAR | Status: DC | PRN

## 2015-05-26 MED ORDER — METOCLOPRAMIDE HCL 10 MG PO TABS: 5.0000 mg | ORAL_TABLET | Freq: Three times a day (TID) | ORAL | Status: DC | PRN

## 2015-05-26 MED ORDER — HYDRALAZINE HCL 10 MG PO TABS
20.0000 mg | ORAL_TABLET | Freq: Two times a day (BID) | ORAL | Status: DC
  Administered 2015-05-26 – 2015-05-28 (×3): 20 mg via ORAL
  Filled 2015-05-26 (×6): qty 2

## 2015-05-26 MED ORDER — SODIUM CHLORIDE 0.9 % IV SOLN
INTRAVENOUS | Status: DC
  Administered 2015-05-26: 08:00:00 via INTRAVENOUS

## 2015-05-26 MED ORDER — DEXTROSE 5 % IV SOLN
10.0000 mg | INTRAVENOUS | Status: DC | PRN
  Administered 2015-05-26: 40 ug/min via INTRAVENOUS

## 2015-05-26 MED ORDER — DEXAMETHASONE SODIUM PHOSPHATE 10 MG/ML IJ SOLN
10.0000 mg | Freq: Once | INTRAMUSCULAR | Status: AC
  Administered 2015-05-26: 10 mg via INTRAVENOUS

## 2015-05-26 MED ORDER — PHENYLEPHRINE HCL 10 MG/ML IJ SOLN
INTRAMUSCULAR | Status: AC
  Filled 2015-05-26: qty 1

## 2015-05-26 MED ORDER — PROMETHAZINE HCL 25 MG/ML IJ SOLN: 6.2500 mg | INTRAMUSCULAR | Status: DC | PRN

## 2015-05-26 MED ORDER — FENTANYL CITRATE (PF) 100 MCG/2ML IJ SOLN
INTRAMUSCULAR | Status: AC
  Filled 2015-05-26: qty 2

## 2015-05-26 MED ORDER — PHENYLEPHRINE 40 MCG/ML (10ML) SYRINGE FOR IV PUSH (FOR BLOOD PRESSURE SUPPORT)
PREFILLED_SYRINGE | INTRAVENOUS | Status: AC
  Filled 2015-05-26: qty 10

## 2015-05-26 MED ORDER — TRANEXAMIC ACID 1000 MG/10ML IV SOLN
1000.0000 mg | INTRAVENOUS | Status: DC
  Administered 2015-05-26: 1000 mg via INTRAVENOUS
  Filled 2015-05-26: qty 10

## 2015-05-26 MED ORDER — DOCUSATE SODIUM 100 MG PO CAPS
100.0000 mg | ORAL_CAPSULE | Freq: Two times a day (BID) | ORAL | Status: DC
  Administered 2015-05-26 – 2015-05-28 (×4): 100 mg via ORAL

## 2015-05-26 MED ORDER — BUPIVACAINE HCL (PF) 0.25 % IJ SOLN
INTRAMUSCULAR | Status: DC | PRN
  Administered 2015-05-26: 30 mL

## 2015-05-26 MED ORDER — CEFAZOLIN SODIUM-DEXTROSE 2-3 GM-% IV SOLR
INTRAVENOUS | Status: AC
  Filled 2015-05-26: qty 50

## 2015-05-26 MED ORDER — ONDANSETRON HCL 4 MG/2ML IJ SOLN
INTRAMUSCULAR | Status: AC
  Filled 2015-05-26: qty 2

## 2015-05-26 MED ORDER — MORPHINE SULFATE 2 MG/ML IJ SOLN
1.0000 mg | INTRAMUSCULAR | Status: DC | PRN
  Administered 2015-05-26 – 2015-05-27 (×2): 1 mg via INTRAVENOUS
  Filled 2015-05-26 (×2): qty 1

## 2015-05-26 MED ORDER — DIPHENHYDRAMINE HCL 12.5 MG/5ML PO ELIX: 12.5000 mg | ORAL_SOLUTION | ORAL | Status: DC | PRN

## 2015-05-26 MED ORDER — FLEET ENEMA 7-19 GM/118ML RE ENEM: 1.0000 | ENEMA | Freq: Once | RECTAL | Status: AC | PRN

## 2015-05-26 MED ORDER — PHENYLEPHRINE HCL 10 MG/ML IJ SOLN
INTRAMUSCULAR | Status: DC | PRN
  Administered 2015-05-26 (×2): 40 ug via INTRAVENOUS

## 2015-05-26 MED ORDER — 0.9 % SODIUM CHLORIDE (POUR BTL) OPTIME
TOPICAL | Status: DC | PRN
  Administered 2015-05-26: 1000 mL

## 2015-05-26 MED ORDER — ACETAMINOPHEN 10 MG/ML IV SOLN
1000.0000 mg | Freq: Once | INTRAVENOUS | Status: AC
  Administered 2015-05-26: 1000 mg via INTRAVENOUS
  Filled 2015-05-26: qty 100

## 2015-05-26 MED ORDER — ONDANSETRON HCL 4 MG PO TABS: 4.0000 mg | ORAL_TABLET | Freq: Four times a day (QID) | ORAL | Status: DC | PRN

## 2015-05-26 MED ORDER — ONDANSETRON HCL 4 MG/2ML IJ SOLN: 4.0000 mg | Freq: Four times a day (QID) | INTRAMUSCULAR | Status: DC | PRN

## 2015-05-26 MED ORDER — FENTANYL CITRATE (PF) 100 MCG/2ML IJ SOLN: 25.0000 ug | INTRAMUSCULAR | Status: DC | PRN

## 2015-05-26 MED ORDER — ACETAMINOPHEN 500 MG PO TABS
1000.0000 mg | ORAL_TABLET | Freq: Four times a day (QID) | ORAL | Status: AC
  Administered 2015-05-26 – 2015-05-27 (×3): 1000 mg via ORAL
  Filled 2015-05-26 (×4): qty 2

## 2015-05-26 SURGICAL SUPPLY — 43 items
BAG DECANTER FOR FLEXI CONT (MISCELLANEOUS) ×1 IMPLANT
BAG SPEC THK2 15X12 ZIP CLS (MISCELLANEOUS)
BAG ZIPLOCK 12X15 (MISCELLANEOUS) IMPLANT
BLADE EXTENDED COATED 6.5IN (ELECTRODE) ×3 IMPLANT
BLADE SAG 18X100X1.27 (BLADE) ×3 IMPLANT
CAPT HIP TOTAL 2 ×2 IMPLANT
CLOSURE WOUND 1/2 X4 (GAUZE/BANDAGES/DRESSINGS) ×1
COVER PERINEAL POST (MISCELLANEOUS) ×3 IMPLANT
DECANTER SPIKE VIAL GLASS SM (MISCELLANEOUS) ×1 IMPLANT
DRAPE C-ARM 42X120 X-RAY (DRAPES) ×3 IMPLANT
DRAPE STERI IOBAN 125X83 (DRAPES) ×3 IMPLANT
DRAPE U-SHAPE 47X51 STRL (DRAPES) ×9 IMPLANT
DRSG ADAPTIC 3X8 NADH LF (GAUZE/BANDAGES/DRESSINGS) ×3 IMPLANT
DRSG MEPILEX BORDER 4X4 (GAUZE/BANDAGES/DRESSINGS) ×3 IMPLANT
DRSG MEPILEX BORDER 4X8 (GAUZE/BANDAGES/DRESSINGS) ×3 IMPLANT
DURAPREP 26ML APPLICATOR (WOUND CARE) ×3 IMPLANT
ELECT REM PT RETURN 9FT ADLT (ELECTROSURGICAL) ×3
ELECTRODE REM PT RTRN 9FT ADLT (ELECTROSURGICAL) ×1 IMPLANT
EVACUATOR 1/8 PVC DRAIN (DRAIN) ×3 IMPLANT
FACESHIELD WRAPAROUND (MASK) ×12 IMPLANT
FACESHIELD WRAPAROUND OR TEAM (MASK) ×4 IMPLANT
GLOVE BIO SURGEON STRL SZ7.5 (GLOVE) ×3 IMPLANT
GLOVE BIO SURGEON STRL SZ8 (GLOVE) ×6 IMPLANT
GLOVE BIOGEL PI IND STRL 8 (GLOVE) ×2 IMPLANT
GLOVE BIOGEL PI INDICATOR 8 (GLOVE) ×4
GOWN STRL REUS W/TWL LRG LVL3 (GOWN DISPOSABLE) ×3 IMPLANT
GOWN STRL REUS W/TWL XL LVL3 (GOWN DISPOSABLE) ×3 IMPLANT
KIT BASIN OR (CUSTOM PROCEDURE TRAY) ×3 IMPLANT
NDL SAFETY ECLIPSE 18X1.5 (NEEDLE) ×1 IMPLANT
NEEDLE HYPO 18GX1.5 SHARP (NEEDLE) ×3
PACK TOTAL JOINT (CUSTOM PROCEDURE TRAY) ×3 IMPLANT
PEN SKIN MARKING BROAD (MISCELLANEOUS) ×3 IMPLANT
STRIP CLOSURE SKIN 1/2X4 (GAUZE/BANDAGES/DRESSINGS) ×2 IMPLANT
SUT ETHIBOND NAB CT1 #1 30IN (SUTURE) ×3 IMPLANT
SUT MNCRL AB 4-0 PS2 18 (SUTURE) ×3 IMPLANT
SUT VIC AB 2-0 CT1 27 (SUTURE) ×6
SUT VIC AB 2-0 CT1 TAPERPNT 27 (SUTURE) ×2 IMPLANT
SUT VLOC 180 0 24IN GS25 (SUTURE) ×3 IMPLANT
SYR 30ML LL (SYRINGE) ×3 IMPLANT
SYR 50ML LL SCALE MARK (SYRINGE) IMPLANT
TOWEL OR 17X26 10 PK STRL BLUE (TOWEL DISPOSABLE) ×3 IMPLANT
TOWEL OR NON WOVEN STRL DISP B (DISPOSABLE) ×2 IMPLANT
TRAY FOLEY W/METER SILVER 14FR (SET/KITS/TRAYS/PACK) ×3 IMPLANT

## 2015-05-26 NOTE — Interval H&P Note (Signed)
History and Physical Interval Note:  05/26/2015 8:16 AM  Eileen Perez  has presented today for surgery, with the diagnosis of OA OF RIGHT HIP  The various methods of treatment have been discussed with the patient and family. After consideration of risks, benefits and other options for treatment, the patient has consented to  Procedure(s): RIGHT TOTAL HIP ARTHROPLASTY ANTERIOR APPROACH (Right) as a surgical intervention .  The patient's history has been reviewed, patient examined, no change in status, stable for surgery.  I have reviewed the patient's chart and labs.  Questions were answered to the patient's satisfaction.     Gearlean Alf

## 2015-05-26 NOTE — Anesthesia Postprocedure Evaluation (Signed)
  Anesthesia Post-op Note  Patient: Eileen Perez  Procedure(s) Performed: Procedure(s): RIGHT TOTAL HIP ARTHROPLASTY ANTERIOR APPROACH (Right)  Patient Location: PACU  Anesthesia Type:Spinal  Level of Consciousness: awake and alert   Airway and Oxygen Therapy: Patient Spontanous Breathing  Post-op Pain: none  Post-op Assessment: Post-op Vital signs reviewed, Patient's Cardiovascular Status Stable, Respiratory Function Stable, RESPIRATORY FUNCTION UNSTABLE and No signs of Nausea or vomiting LLE Motor Response: No movement due to regional block   RLE Motor Response: No movement due to regional block   L Sensory Level: T12-Inguinal (groin) region R Sensory Level: T12-Inguinal (groin) region  Post-op Vital Signs: Reviewed and stable  Last Vitals:  Filed Vitals:   05/26/15 1045  BP: 144/62  Pulse: 60  Temp:   Resp: 13    Complications: No apparent anesthesia complications

## 2015-05-26 NOTE — H&P (View-Only) (Signed)
Eileen Perez DOB: 14-Dec-1940 Divorced / Language: English / Race: Black or African American Female Date of Admission:  05/26/2015 CC:  Right Hip Pain History of Present Illness The patient is a 74 year old female who comes in for a preoperative History and Physical. The patient is scheduled for a right total hip arthroplasty (anterior) to be performed by Dr. Dione Plover. Aluisio, MD at [facility] on 05-26-2015. The patient is a 74 year old female who has been followed for their right hip. The patient is being followed for their right hip pain and osteoarthritis. Ms. [name] states that right hip has been bothering her for over a year now. It has been progressive in nature. We had seen her last summer and noted that she had significant arthritis. Richardson Landry saw her again about a month ago, when she had progressively worsening pain and dysfunction thus she was set up for this hip replacement surgery. Hip is hurting at all times, day and night. It is something which she can and cannot do. She is eagerly anticipating the surgery. When she gets discomfort it is mainly in the right thigh, going up to her groin. She said she has good days and bad days. She is ready to proceed with surgical fixation of the right hip at this time.  They have been treated conservatively in the past for the above stated problem and despite conservative measures, they continue to have progressive pain and severe functional limitations and dysfunction. They have failed non-operative management including home exercise, medications. It is felt that they would benefit from undergoing total joint replacement. Risks and benefits of the procedure have been discussed with the patient and they elect to proceed with surgery. There are no active contraindications to surgery such as ongoing infection or rapidly progressive neurological disease. She does have chronic kidney disease but has been seen recently.  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 never smoker Drug/Alcohol Rehab (Currently) no Pain Contract no Drug/Alcohol Rehab (Previously) no Number of flights of stairs before winded greater than 5 Marital status divorced Living situation live alone Advance Directives Living Will, Healthcare POA [place]  Medication History  Aspirin EC (81MG  Tablet DR, Oral) Active. Red Yeast Rice (Oral) Specific dose unknown - Active. (1200mg  BID) Labetalol HCl (200MG  Tablet, Oral) Active. Allopurinol (300MG  Tablet, Oral) Active. Flaxseed Oil (1000MG  Capsule, Oral) Active. Zinc 15 (Oral) Specific dose unknown - Active. Multi Vitamin/Minerals (Oral) Active. Calcium (Oral) Specific dose unknown - Active. Fish Oil Active. Stool Softener (100MG  Capsule, Oral) Active. Vitamin D3 (2000UNIT Capsule, Oral) Active. Slow Fe (142 (45 Fe)MG Tablet ER, Oral) Active. HydrALAZINE HCl (10MG  Tablet, 2 tabs Oral twice a day) Active. Amlodipine Active. Norvasc (5MG  Tablet, 1/2 tablet Oral once daily at night) Active.  Past Surgical History Cataract Surgery left Gallbladder Surgery Date: 1999. open, Pancreatic Cyst Removal Total Knee Replacement left Total Knee Replacement - Right  Review of Systems General Not Present- Chills, Fatigue, Fever,  Memory  Loss, Night Sweats, Weight Gain and Weight Loss. Skin Not Present- Eczema, Hives, Itching, Lesions and Rash. HEENT Not Present- Dentures, Double Vision, Headache, Hearing Loss, Tinnitus and Visual Loss. Respiratory Not Present- Allergies, Chronic Cough, Coughing up blood, Shortness of breath at rest and Shortness of breath with exertion. Cardiovascular Not Present- Chest Pain, Difficulty Breathing Lying Down, Murmur, Palpitations, Racing/skipping heartbeats and Swelling. Gastrointestinal Not Present- Abdominal Pain, Bloody Stool, Constipation, Diarrhea, Difficulty Swallowing, Heartburn, Jaundice, Loss of appetitie, Nausea and Vomiting. Female Genitourinary Present- Urinating at Night. Not Present- Blood in Urine, Discharge, Flank Pain, Incontinence, Painful Urination, Urgency, Urinary frequency, Urinary Retention and Weak urinary stream. Musculoskeletal Present- Joint Pain, Morning Stiffness and Muscle Weakness. Not Present- Back Pain, Joint Swelling, Muscle Pain and Spasms. Neurological Not Present- Blackout spells, Difficulty with balance, Dizziness, Paralysis, Tremor and Weakness. Psychiatric Not Present- Insomnia.  Vitals Weight: 134 lb Height: 62in Body Surface Area: 1.61 m Body Mass Index: 24.51 kg/m  BP: 172/92 (Sitting, Right Arm, Standard)  Physical Exam General Mental Status -Alert, cooperative and good historian. General Appearance-pleasant, Not in acute distress. Orientation-Oriented X3. [place], Well nourished and Well developed. Note: petite  Head and Neck Head-normocephalic, atraumatic . Neck Global Assessment - supple, no bruit auscultated on the right, no bruit auscultated on the left.  Eye Pupil - Bilateral-Regular and Round. Motion - Bilateral-EOMI.  Chest and Lung Exam Auscultation Breath sounds - clear at anterior chest wall and clear at posterior chest wall. Adventitious sounds - No Adventitious  sounds.  Cardiovascular Auscultation Rhythm - Regular rate and rhythm. Heart Sounds - S1 WNL and S2 WNL. Murmurs & Other Heart Sounds - Auscultation of the heart reveals - No Murmurs.  Abdomen Palpation/Percussion Tenderness - Abdomen is non-tender to palpation. Rigidity (guarding) - Abdomen is soft. Auscultation Auscultation of the abdomen reveals - Bowel sounds normal.  Female Genitourinary Note: Not done, not pertinent to present illness  Musculoskeletal Note: Well developed female in no distress. Right hip is able to be flexed to 90, no internal rotation about 10 to 20, external rotation 10 to 20 of abduction. She is nontender about the hip.  X-rays: Radiographs from last visit, AP pelvis and lateral hip. 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  Assessment & Plan Primary osteoarthritis of right hip (M16.11) Note:Surgical Plans: Right Total Hip Replacement - Anterior Approach  Disposition: Home  PCP: Dr. Unk Pinto Renal: [facility] - Dr. Louis Meckel - "Now we have this situation of possibly needing a fairly majpr surgery in the next couple of months on her hip. I told her she could be at risk of A on CRF necessitating dialysis after that surgery."  IV TXA  Anesthesia Issues: None  Signed electronically by Ok Edwards, III PA-C

## 2015-05-26 NOTE — Transfer of Care (Signed)
Immediate Anesthesia Transfer of Care Note  Patient: Eileen Perez  Procedure(s) Performed: Procedure(s): RIGHT TOTAL HIP ARTHROPLASTY ANTERIOR APPROACH (Right)  Patient Location: PACU  Anesthesia Type:Spinal  Level of Consciousness:  sedated, patient cooperative and responds to stimulation  Airway & Oxygen Therapy:Patient Spontanous Breathing and Patient connected to face mask oxgen  Post-op Assessment:  Report given to PACU RN and Post -op Vital signs reviewed and stable  Post vital signs:  Reviewed and stable  Last Vitals:  Filed Vitals:   05/26/15 0632  BP: 181/84  Pulse: 70  Temp: 37.2 C  Resp: 18    Complications: No apparent anesthesia complications, [id number] on release to PACU

## 2015-05-26 NOTE — Evaluation (Signed)
Physical Therapy Evaluation Patient Details Name: Eileen Perez MRN: [id number] DOB: Sep 23, 1941 Today's Date: 05/26/2015   History of Present Illness  R THR; s/p bil TKR  Clinical Impression  Pt s/p R THR presents with decreased R LE strength/ROM and post op pain limiting functional mobility.  Pt should progress well to dc home with assist of dtr and HHPT follow up.    Follow Up Recommendations Home health PT    Equipment Recommendations  None recommended by PT    Recommendations for Other Services OT consult     Precautions / Restrictions Precautions Precautions: Fall Restrictions Weight Bearing Restrictions: No Other Position/Activity Restrictions: WBAT      Mobility  Bed Mobility Overal bed mobility: Needs Assistance Bed Mobility: Supine to Sit     Supine to sit: Min assist     General bed mobility comments: cues for sequence and use of L LE to self assist  Transfers Overall transfer level: Needs assistance Equipment used: Rolling walker (2 wheeled) Transfers: Sit to/from Stand Sit to Stand: Min assist         General transfer comment: cues for LE management and use of UEs to self assist  Ambulation/Gait Ambulation/Gait assistance: Min assist Ambulation Distance (Feet): 75 Feet Assistive device: Rolling walker (2 wheeled) Gait Pattern/deviations: Step-to pattern;Step-through pattern;Decreased step length - right;Decreased step length - left;Shuffle;Trunk flexed     General Gait Details: cues for posture, position from RW and initial sequence  Stairs            Wheelchair Mobility    Modified Rankin (Stroke Patients Only)       Balance                                             Pertinent Vitals/Pain Pain Assessment: 0-10 Pain Score: 4  Pain Location: R hip Pain Descriptors / Indicators: Aching;Sore Pain Intervention(s): Limited activity within patient's tolerance;Monitored during session;Premedicated before  session;Ice applied    Home Living Family/patient expects to be discharged to:: Private residence Living Arrangements: Alone Available Help at Discharge: Family;Available 24 hours/day Type of Home: House Home Access: Stairs to enter   [employer] of Steps: 1 Home Layout: One level Home Equipment: Walker - 2 wheels Additional Comments: Dtr can assist until this coming Monday - will be out of town for work    Prior Function Level of Independence: Independent;Independent with assistive device(s)               Hand Dominance        Extremity/Trunk Assessment   Upper Extremity Assessment: Overall WFL for tasks assessed           Lower Extremity Assessment: RLE deficits/detail      Cervical / Trunk Assessment: Normal  Communication   Communication: No difficulties  Cognition Arousal/Alertness: Awake/alert Behavior During Therapy: WFL for tasks assessed/performed Overall Cognitive Status: Within Functional Limits for tasks assessed                      General Comments      Exercises        Assessment/Plan    PT Assessment Patient needs continued PT services  PT Diagnosis Difficulty walking   PT Problem List Decreased strength;Decreased range of motion;Decreased activity tolerance;Decreased mobility;Decreased knowledge of use of DME;Pain  PT Treatment Interventions DME instruction;Gait training;Stair training;Functional mobility training;Therapeutic activities;Therapeutic  exercise;Patient/family education   PT Goals (Current goals can be found in the Care Plan section) Acute Rehab PT Goals Patient Stated Goal: Resume previous lifestyle with decreased pain PT Goal Formulation: With patient Time For Goal Achievement: 05/29/15 Potential to Achieve Goals: Good    Frequency 7X/week   Barriers to discharge        Co-evaluation               End of Session Equipment Utilized During Treatment: Gait belt Activity Tolerance:  Patient tolerated treatment well Patient left: in chair;with call bell/phone within reach;with family/visitor present Nurse Communication: Mobility status         Time: [id number] PT Time Calculation (min) (ACUTE ONLY): 32 min   Charges:   PT Evaluation $Initial PT Evaluation Tier I: 1 Procedure PT Treatments $Gait Training: 8-22 mins   PT G Codes:        Eileen Perez 2015-05-30, 4:26 PM

## 2015-05-26 NOTE — Op Note (Signed)
OPERATIVE REPORT  PREOPERATIVE DIAGNOSIS: Osteoarthritis of the Right hip.   POSTOPERATIVE DIAGNOSIS: Osteoarthritis of the Right  hip.   PROCEDURE: Right total hip arthroplasty, anterior approach.   SURGEON: Gaynelle Arabian, MD   ASSISTANT: Arlee Muslim, PA-C  ANESTHESIA:  Spinal  ESTIMATED BLOOD LOSS:- 400 ml    DRAINS: Hemovac x1.   COMPLICATIONS: None   CONDITION: PACU - hemodynamically stable.   BRIEF CLINICAL NOTE: Eileen Perez is a 74 y.o. female who has advanced end-  stage arthritis of her Right  hip with progressively worsening pain and  dysfunction.The patient has failed nonoperative management and presents for  total hip arthroplasty.   PROCEDURE IN DETAIL: After successful administration of spinal  anesthetic, the traction boots for the [facility] bed were placed on both  feet and the patient was placed onto the [facility] bed, boots placed into the leg  holders. The Right hip was then isolated from the perineum with plastic  drapes and prepped and draped in the usual sterile fashion. ASIS and  greater trochanter were marked and a oblique incision was made, starting  at about 1 cm lateral and 2 cm distal to the ASIS and coursing towards  the anterior cortex of the femur. The skin was cut with a 10 blade  through subcutaneous tissue to the level of the fascia overlying the  tensor fascia lata muscle. The fascia was then incised in line with the  incision at the junction of the anterior third and posterior 2/3rd. The  muscle was teased off the fascia and then the interval between the TFL  and the rectus was developed. The Hohmann retractor was then placed at  the top of the femoral neck over the capsule. The vessels overlying the  capsule were cauterized and the fat on top of the capsule was removed.  A Hohmann retractor was then placed anterior underneath the rectus  femoris to give exposure to the entire anterior capsule. A T-shaped  capsulotomy was performed.  The edges were tagged and the femoral head  was identified.       Osteophytes are removed off the superior acetabulum.  The femoral neck was then cut in situ with an oscillating saw. Traction  was then applied to the left lower extremity utilizing the [facility]  traction. The femoral head was then removed. Retractors were placed  around the acetabulum and then circumferential removal of the labrum was  performed. Osteophytes were also removed. Reaming starts at 43 mm to  medialize and  Increased in 2 mm increments to 49 mm. We reamed in  approximately 40 degrees of abduction, 20 degrees anteversion. A 50 mm  pinnacle acetabular shell was then impacted in anatomic position under  fluoroscopic guidance with excellent purchase. We did not need to place  any additional dome screws. A 32 mm neutral + 4 marathon liner was then  placed into the acetabular shell.       The femoral lift was then placed along the lateral aspect of the femur  just distal to the vastus ridge. The leg was  externally rotated and capsule  was stripped off the inferior aspect of the femoral neck down to the  level of the lesser trochanter, this was done with electrocautery. The femur was lifted after this was performed. The  leg was then placed and extended in adducted position to essentially delivering the femur. We also removed the capsule superiorly and the  piriformis from the  piriformis fossa to gain excellent exposure of the  proximal femur. Rongeur was used to remove some cancellous bone to get  into the lateral portion of the proximal femur for placement of the  initial starter reamer. The starter broaches was placed  the starter broach  and was shown to go down the center of the canal. Broaching  with the  Corail system was then performed starting at size 8, coursing  Up to size 12. A size 12 had excellent torsional and rotational  and axial stability. The trial high offset neck was then placed  with a 32 + 1 trial  head. The hip was then reduced. We confirmed that  the stem was in the canal both on AP and lateral x-rays. It also has excellent sizing. The hip was reduced with outstanding stability through full extension, full external rotation,  and then flexion in adduction internal rotation. AP pelvis was taken  and the leg lengths were measured and found to be exactly equal. Hip  was then dislocated again and the femoral head and neck removed. The  femoral broach was removed. Size 12 Corail stem with a high offset  neck was then impacted into the femur following native anteversion. Has  excellent purchase in the canal. Excellent torsional and rotational and  axial stability. It is confirmed to be in the canal on AP and lateral  fluoroscopic views. The 32 + 1 ceramic head was placed and the hip  reduced with outstanding stability. Again AP pelvis was taken and it  confirmed that the leg lengths were equal. The wound was then copiously  irrigated with saline solution and the capsule reattached and repaired  with Ethibond suture.  30 ml of .25% Bupivicaine injected into the capsule and into the edge of the tensor fascia lata as well as subcutaneous tissue. The fascia overlying the tensor fascia lata was  then closed with a running #1 V-Loc. Subcu was closed with interrupted  2-0 Vicryl and subcuticular running 4-0 Monocryl. Incision was cleaned  and dried. Steri-Strips and a bulky sterile dressing applied. Hemovac  drain was hooked to suction and then he was awakened and transported to  recovery in stable condition.        Please note that a surgical assistant was a medical necessity for this procedure to perform it in a safe and expeditious manner. Assistant was necessary to provide appropriate retraction of vital neurovascular structures and to prevent femoral fracture and allow for anatomic placement of the prosthesis.  Gaynelle Arabian, M.D.

## 2015-05-26 NOTE — Anesthesia Procedure Notes (Signed)
Spinal Patient location during procedure: OR Start time: 05/26/2015 8:26 AM End time: 05/26/2015 8:33 AM Reason for block: at surgeon's request Staffing Resident/CRNA: Anne Fu Performed by: resident/CRNA  Preanesthetic Checklist Completed: patient identified, site marked, surgical consent, pre-op evaluation, timeout performed, IV checked, risks and benefits discussed, monitors and equipment checked and at surgeon's request Spinal Block Patient position: sitting Approach: right paramedian Location: L2-3 Injection technique: single-shot Needle Needle type: Spinocan  Needle gauge: 22 G Needle length: 9 cm Assessment Sensory level: T6 Additional Notes Expiration date of kit checked and confirmed. Patient tolerated procedure well, without complications. X 1 attempt with noted clear CSF return. Loss of motor and sensory on exam post injection.

## 2015-05-26 NOTE — Care Management Note (Signed)
Case Management Note  Patient Details  Name: Kikuye Korenek MRN: [id number] Date of Birth: [birth year]  Subjective/Objective:                   RIGHT TOTAL HIP ARTHROPLASTY ANTERIOR APPROACH (Right) Action/Plan:  Discharge planning Expected Discharge Date:  05/26/15               Expected Discharge Plan:  [place]  In-House Referral:     Discharge planning Services  CM Consult  Post Acute Care Choice:  Home Health Choice offered to:  Patient  DME Arranged:  3-N-1 DME Agency:  [place]:  PT [place] Agency:  [place]  Status of Service:  Completed, signed off  Medicare Important Message Given:    Date Medicare IM Given:    Medicare IM give by:    Date Additional Medicare IM Given:    Additional Medicare Important Message give by:     If discussed at [place] of Stay Meetings, dates discussed:    Additional Comments: CM met with pt in room to offe choice of home health agency.  Pt states she has had both [facility] and [facility] in the past and prefers to have [facility] render HHPT.  Address and contact information verified with pt with preference to home number as contact.  Referral called to [facility] rep, Kristen.  CM called [facility] DME rep, Lecretia to please deliver the 3n1 to room prior to discharge.  NO other CM needs were communicated. Dellie Catholic, RN 05/26/2015, 12:52 PM

## 2015-05-26 NOTE — Progress Notes (Signed)
Utilization review completed.  

## 2015-05-27 ENCOUNTER — Encounter (HOSPITAL_COMMUNITY): Payer: Self-pay | Admitting: Orthopedic Surgery

## 2015-05-27 LAB — BASIC METABOLIC PANEL
Anion gap: 7 (ref 5–15)
BUN: 59 mg/dL — AB (ref 6–20)
CHLORIDE: 110 mmol/L (ref 101–111)
CO2: 19 mmol/L — ABNORMAL LOW (ref 22–32)
CREATININE: 3.98 mg/dL — AB (ref 0.44–1.00)
Calcium: 8.8 mg/dL — ABNORMAL LOW (ref 8.9–10.3)
GFR calc Af Amer: 12 mL/min — ABNORMAL LOW (ref >60)
GFR calc non Af Amer: 10 mL/min — ABNORMAL LOW (ref >60)
Glucose, Bld: 131 mg/dL — ABNORMAL HIGH (ref 65–99)
Potassium: 5.4 mmol/L — ABNORMAL HIGH (ref 3.5–5.1)
Sodium: 136 mmol/L (ref 135–145)

## 2015-05-27 LAB — CBC
HEMATOCRIT: 26.1 % — AB (ref 36.0–46.0)
Hemoglobin: 8.2 g/dL — ABNORMAL LOW (ref 12.0–15.0)
MCH: 29.4 pg (ref 26.0–34.0)
MCHC: 31.4 g/dL (ref 30.0–36.0)
MCV: 93.5 fL (ref 78.0–100.0)
Platelets: 139 10*3/uL — ABNORMAL LOW (ref 150–400)
RBC: 2.79 MIL/uL — AB (ref 3.87–5.11)
RDW: 15.3 % (ref 11.5–15.5)
WBC: 6.4 10*3/uL (ref 4.0–10.5)

## 2015-05-27 MED ORDER — METHOCARBAMOL 500 MG PO TABS: 500.0000 mg | ORAL_TABLET | Freq: Four times a day (QID) | ORAL | Status: DC | PRN

## 2015-05-27 MED ORDER — POLYSACCHARIDE IRON COMPLEX 150 MG PO CAPS
150.0000 mg | ORAL_CAPSULE | Freq: Every day | ORAL | Status: DC
  Administered 2015-05-27 – 2015-05-28 (×2): 150 mg via ORAL
  Filled 2015-05-27 (×2): qty 1

## 2015-05-27 NOTE — Evaluation (Signed)
Occupational Therapy Evaluation Patient Details Name: Eileen Perez MRN: [id number] DOB: 1941/06/14 Today's Date: 05/27/2015    History of Present Illness R THR; s/p bil TKR   Clinical Impression   Pt is s/p TKA resulting in the deficits listed below (see OT Problem List).  Pt will benefit from skilled OT to increase their safety and independence with ADL and functional mobility for ADL to facilitate discharge to venue listed below.        Follow Up Recommendations  Home health OT;Supervision - Intermittent    Equipment Recommendations  None recommended by OT       Precautions / Restrictions Precautions Precautions: Fall Restrictions Weight Bearing Restrictions: No Other Position/Activity Restrictions: WBAT      Mobility Bed Mobility               General bed mobility comments: pt up in chair  Transfers Overall transfer level: Needs assistance Equipment used: Rolling walker (2 wheeled) Transfers: Sit to/from Stand Sit to Stand: Min assist                   ADL Overall ADL's : Needs assistance/impaired     Grooming: Standing;Cueing for safety;Wash/dry hands;Min guard       Lower Body Bathing: Moderate assistance;Sit to/from stand;Cueing for sequencing           Toilet Transfer: Minimal assistance;Comfort height toilet;Ambulation;RW   Toileting- Clothing Manipulation and Hygiene: Minimal assistance;Sit to/from stand;Cueing for sequencing;Cueing for safety                         Pertinent Vitals/Pain Pain Score: 3  Pain Location: r hip Pain Descriptors / Indicators: Sore Pain Intervention(s): Monitored during session;Ice applied        Extremity/Trunk Assessment Upper Extremity Assessment Upper Extremity Assessment: Overall WFL for tasks assessed           Communication Communication Communication: No difficulties   Cognition Arousal/Alertness: Awake/alert Behavior During Therapy: WFL for tasks  assessed/performed Overall Cognitive Status: Within Functional Limits for tasks assessed                                Home Living Family/patient expects to be discharged to:: Private residence Living Arrangements: Alone Available Help at Discharge: Family;Available 24 hours/day Type of Home: House Home Access: Stairs to enter [employer] of Steps: 1   Home Layout: One level               Home Equipment: Walker - 2 wheels   Additional Comments: Dtr can assist until this coming Monday - will be out of town for work      Prior Functioning/Environment Level of Independence: Independent;Independent with assistive device(s)             OT Diagnosis: Generalized weakness   OT Problem List: Decreased strength;Decreased activity tolerance   OT Treatment/Interventions: Self-care/ADL training;DME and/or AE instruction;Patient/family education    OT Goals(Current goals can be found in the care plan section) Acute Rehab OT Goals Patient Stated Goal: Resume previous lifestyle with decreased pain OT Goal Formulation: With patient Time For Goal Achievement: 06/10/15 Potential to Achieve Goals: Good ADL Goals Pt Will Perform Grooming: with modified independence;standing Pt Will Perform Lower Body Dressing: with modified independence;sit to/from stand Pt Will Transfer to Toilet: with modified independence;regular height toilet;ambulating Pt Will Perform Toileting - Clothing Manipulation and hygiene: with modified independence;sit to/from stand  Pt Will Perform Tub/Shower Transfer: with modified independence  OT Frequency: Min 2X/week              End of Session Nurse Communication: Mobility status  Activity Tolerance: Patient tolerated treatment well Patient left: in chair;with call bell/phone within reach   Time: 1013-1030 OT Time Calculation (min): 17 min Charges:  OT General Charges $OT Visit: 1 Procedure OT Evaluation $Initial OT Evaluation  Tier I: 1 Procedure G-Codes:    Payton Mccallum D 28-May-2015, 11:20 AM

## 2015-05-27 NOTE — Progress Notes (Signed)
   Subjective: 1 Day Post-Op Procedure(s) (LRB): RIGHT TOTAL HIP ARTHROPLASTY ANTERIOR APPROACH (Right) Patient reports pain as mild and moderate.  Pain is down into the knee likely from the traction. Patient seen in rounds with Dr. Wynelle Link. Patient is well, but has had some minor complaints of pain in the leg and knee, requiring pain medications We will resume therapy today. She already walked 36 feet yesterday following surgery. Plan is to go Home after hospital stay.  Objective: Vital signs in last 24 hours: Temp:  [97.5 F (36.4 C)-99.1 F (37.3 C)] 98.8 F (37.1 C) (07/07 0600) Pulse Rate:  [60-73] 72 (07/07 0600) Resp:  [10-15] 14 (07/07 0600) BP: (121-190)/(56-82) 134/56 mmHg (07/07 0600) SpO2:  [100 %] 100 % (07/07 0600)  Intake/Output from previous day:  Intake/Output Summary (Last 24 hours) at 05/27/15 0740 Last data filed at 05/27/15 0626  Gross per 24 hour  Intake   2645 ml  Output   1730 ml  Net    915 ml    Intake/Output this shift: UOP 550 since around MN (150 from the shift before yesterday)  Labs:  Recent Labs  05/27/15 0425  HGB 8.2*    Recent Labs  05/27/15 0425  WBC 6.4  RBC 2.79*  HCT 26.1*  PLT 139*    Recent Labs  05/26/15 0710 05/27/15 0425  NA 141 136  K 5.2* 5.4*  CL 112* 110  CO2 20* 19*  BUN 57* 59*  CREATININE 4.11* 3.98*  GLUCOSE 123* 131*  CALCIUM 10.3 8.8*   No results for input(s): LABPT, INR in the last 72 hours.  EXAM General - Patient is Alert, Appropriate and Oriented Extremity - Neurovascular intact Sensation intact distally Dorsiflexion/Plantar flexion intact Dressing - dressing C/D/I Motor Function - intact, moving foot and toes well on exam.  Hemovac pulled without difficulty.  Past Medical History  Diagnosis Date  . Diabetes mellitus     Pre  . Hypertension   . Arthritis     Osteoarthritis  . Anemia   . History of blood transfusion   . Chronic kidney disease     Chronic renal insufficiency    . Pancreatic cyst 1999  . Thyroid disease     Hyperparathyroid   . GERD (gastroesophageal reflux disease)   . Gout   . Osteopenia   . Diverticulosis   . Hiatal hernia   . Hyperlipidemia   . Heart murmur     hx of in childhood    Assessment/Plan: 1 Day Post-Op Procedure(s) (LRB): RIGHT TOTAL HIP ARTHROPLASTY ANTERIOR APPROACH (Right) Principal Problem:   OA (osteoarthritis) of hip  Estimated body mass index is 24.91 kg/(m^2) as calculated from the following:   Height as of this encounter: 5' 1.5" (1.562 m).   Weight as of this encounter: 60.782 kg (134 lb). Advance diet Up with therapy Plan for discharge tomorrow Discharge home with home health  DVT Prophylaxis - Eliquis BID Weight Bearing As Tolerated right Leg Hemovac Pulled Begin Therapy Monitor UOP and Renal function  Arlee Muslim, PA-C Orthopaedic Surgery 05/27/2015, 7:40 AM

## 2015-05-27 NOTE — Discharge Instructions (Addendum)
° °Dr. Frank Aluisio °Total Joint Specialist °[facility] °[address] °[place], [place] [address] °[phone] ° °ANTERIOR APPROACH TOTAL HIP REPLACEMENT POSTOPERATIVE DIRECTIONS ° ° °Hip Rehabilitation, Guidelines Following Surgery  °The results of a hip operation are greatly improved after range of motion and muscle strengthening exercises. Follow all safety measures which are given to protect your hip. If any of these exercises cause increased pain or swelling in your joint, decrease the amount until you are comfortable again. Then slowly increase the exercises. Call your caregiver if you have problems or questions.  ° °HOME CARE INSTRUCTIONS  °Remove items at home which could result in a fall. This includes throw rugs or furniture in walking pathways.  °· ICE to the affected hip every three hours for 30 minutes at a time and then as needed for pain and swelling.  Continue to use ice on the hip for pain and swelling from surgery. You may notice swelling that will progress down to the foot and ankle.  This is normal after surgery.  Elevate the leg when you are not up walking on it.   °· Continue to use the breathing machine which will help keep your temperature down.  It is common for your temperature to cycle up and down following surgery, especially at night when you are not up moving around and exerting yourself.  The breathing machine keeps your lungs expanded and your temperature down. ° ° °DIET °You may resume your previous home diet once your are discharged from the hospital. ° °DRESSING / WOUND CARE / SHOWERING °You may shower 3 days after surgery, but keep the wounds dry during showering.  You may use an occlusive plastic wrap (Press'n Seal for example), NO SOAKING/SUBMERGING IN THE BATHTUB.  If the bandage gets wet, change with a clean dry gauze.  If the incision gets wet, pat the wound dry with a clean towel. °You may start showering once you are discharged home but do not  submerge the incision under water. Just pat the incision dry and apply a dry gauze dressing on daily. °Change the surgical dressing daily and reapply a dry dressing each time. ° °ACTIVITY °Walk with your walker as instructed. °Use walker as long as suggested by your caregivers. °Avoid periods of inactivity such as sitting longer than an hour when not asleep. This helps prevent blood clots.  °You may resume a sexual relationship in one month or when given the OK by your doctor.  °You may return to work once you are cleared by your doctor.  °Do not drive a car for 6 weeks or until released by you surgeon.  °Do not drive while taking narcotics. ° °WEIGHT BEARING °Weight bearing as tolerated with assist device (walker, cane, etc) as directed, use it as long as suggested by your surgeon or therapist, typically at least 4-6 weeks. ° °POSTOPERATIVE CONSTIPATION PROTOCOL °Constipation - defined medically as fewer than three stools per week and severe constipation as less than one stool per week. ° °One of the most common issues patients have following surgery is constipation.  Even if you have a regular bowel pattern at home, your normal regimen is likely to be disrupted due to multiple reasons following surgery.  Combination of anesthesia, postoperative narcotics, change in appetite and fluid intake all can affect your bowels.  In order to avoid complications following surgery, here are some recommendations in order to help you during your recovery period. ° °Colace (docusate) - Pick up an over-the-counter   form of Colace or another stool softener and take twice a day as long as you are requiring postoperative pain medications.  Take with a full glass of water daily.  If you experience loose stools or diarrhea, hold the colace until you stool forms back up.  If your symptoms do not get better within 1 week or if they get worse, check with your doctor. ° °Dulcolax (bisacodyl) - Pick up over-the-counter and take as directed  by the product packaging as needed to assist with the movement of your bowels.  Take with a full glass of water.  Use this product as needed if not relieved by Colace only.  ° °MiraLax (polyethylene glycol) - Pick up over-the-counter to have on hand.  MiraLax is a solution that will increase the amount of water in your bowels to assist with bowel movements.  Take as directed and can mix with a glass of water, juice, soda, coffee, or tea.  Take if you go more than two days without a movement. °Do not use MiraLax more than once per day. Call your doctor if you are still constipated or irregular after using this medication for 7 days in a row. ° °If you continue to have problems with postoperative constipation, please contact the office for further assistance and recommendations.  If you experience "the worst abdominal pain ever" or develop nausea or vomiting, please contact the office immediatly for further recommendations for treatment. ° °ITCHING ° If you experience itching with your medications, try taking only a single pain pill, or even half a pain pill at a time.  You can also use Benadryl over the counter for itching or also to help with sleep.  ° °TED HOSE STOCKINGS °Wear the elastic stockings on both legs for three weeks following surgery during the day but you may remove then at night for sleeping. ° °MEDICATIONS °See your medication summary on the “After Visit Summary” that the nursing staff will review with you prior to discharge.  You may have some home medications which will be placed on hold until you complete the course of blood thinner medication.  It is important for you to complete the blood thinner medication as prescribed by your surgeon.  Continue your approved medications as instructed at time of discharge. ° °PRECAUTIONS °If you experience chest pain or shortness of breath - call 911 immediately for transfer to the hospital emergency department.  °If you develop a fever greater that 101 F,  purulent drainage from wound, increased redness or drainage from wound, foul odor from the wound/dressing, or calf pain - CONTACT YOUR SURGEON.   °                                                °FOLLOW-UP APPOINTMENTS °Make sure you keep all of your appointments after your operation with your surgeon and caregivers. You should call the office at the above phone number and make an appointment for approximately two weeks after the date of your surgery or on the date instructed by your surgeon outlined in the "After Visit Summary". ° °RANGE OF MOTION AND STRENGTHENING EXERCISES  °These exercises are designed to help you keep full movement of your hip joint. Follow your caregiver's or physical therapist's instructions. Perform all exercises about fifteen times, three times per day or as directed. Exercise both hips, even if you   have had only one joint replacement. These exercises can be done on a training (exercise) mat, on the floor, on a table or on a bed. Use whatever works the best and is most comfortable for you. Use music or television while you are exercising so that the exercises are a pleasant break in your day. This will make your life better with the exercises acting as a break in routine you can look forward to.  Lying on your back, slowly slide your foot toward your buttocks, raising your knee up off the floor. Then slowly slide your foot back down until your leg is straight again.  Lying on your back spread your legs as far apart as you can without causing discomfort.  Lying on your side, raise your upper leg and foot straight up from the floor as far as is comfortable. Slowly lower the leg and repeat.  Lying on your back, tighten up the muscle in the front of your thigh (quadriceps muscles). You can do this by keeping your leg straight and trying to raise your heel off the floor. This helps strengthen the largest muscle supporting your knee.  Lying on your back, tighten up the muscles of your  buttocks both with the legs straight and with the knee bent at a comfortable angle while keeping your heel on the floor.   IF YOU ARE TRANSFERRED TO A SKILLED REHAB FACILITY If the patient is transferred to a skilled rehab facility following release from the hospital, a list of the current medications will be sent to the facility for the patient to continue.  When discharged from the skilled rehab facility, please have the facility set up the patient's [place] prior to being released. Also, the skilled facility will be responsible for providing the patient with their medications at time of release from the facility to include their pain medication, the muscle relaxants, and their blood thinner medication. If the patient is still at the rehab facility at time of the two week follow up appointment, the skilled rehab facility will also need to assist the patient in arranging follow up appointment in our office and any transportation needs.  MAKE SURE YOU:  Understand these instructions.  Get help right away if you are not doing well or get worse.    Pick up stool softner and laxative for home use following surgery while on pain medications. Do not submerge incision under water. Please use good hand washing techniques while changing dressing each day. May shower starting three days after surgery. Please use a clean towel to pat the incision dry following showers. Continue to use ice for pain and swelling after surgery. Do not use any lotions or creams on the incision until instructed by your surgeon.  Take Eliquis for two and a half more weeks, then discontinue Eliquis. Once the patient has completed the Eliquis, they may resume the 81 mg Aspirin.    Information on my medicine - ELIQUIS (apixaban)  This medication education was reviewed with me or my healthcare representative as part of my discharge preparation.  The pharmacist that spoke with me during my hospital stay  was:  Luiz Ochoa [facility]  Why was Eliquis prescribed for you? Eliquis was prescribed for you to reduce the risk of blood clots forming after orthopedic surgery.    What do You need to know about Eliquis? Take your Eliquis TWICE DAILY - one tablet in the morning and one tablet in the evening with or without  food.  It would be best to take the dose about the same time each day.  If you have difficulty swallowing the tablet whole please discuss with your pharmacist how to take the medication safely.  Take Eliquis exactly as prescribed by your doctor and DO NOT stop taking Eliquis without talking to the doctor who prescribed the medication.  Stopping without other medication to take the place of Eliquis may increase your risk of developing a clot.  After discharge, you should have regular check-up appointments with your healthcare provider that is prescribing your Eliquis.  What do you do if you miss a dose? If a dose of ELIQUIS is not taken at the scheduled time, take it as soon as possible on the same day and twice-daily administration should be resumed.  The dose should not be doubled to make up for a missed dose.  Do not take more than one tablet of ELIQUIS at the same time.  Important Safety Information A possible side effect of Eliquis is bleeding. You should call your healthcare provider right away if you experience any of the following: ? Bleeding from an injury or your nose that does not stop. ? Unusual colored urine (red or dark brown) or unusual colored stools (red or black). ? Unusual bruising for unknown reasons. ? A serious fall or if you hit your head (even if there is no bleeding).  Some medicines may interact with Eliquis and might increase your risk of bleeding or clotting while on Eliquis. To help avoid this, consult your healthcare provider or pharmacist prior to using any new prescription or non-prescription medications, including herbals, vitamins,  non-steroidal anti-inflammatory drugs (NSAIDs) and supplements.  This website has more information on Eliquis (apixaban): http://www.eliquis.com/eliquis/home

## 2015-05-27 NOTE — Progress Notes (Signed)
Physical Therapy Treatment Patient Details Name: Eileen Perez MRN: [id number] DOB: 1941-02-07 Today's Date: 2015-06-06    History of Present Illness R THR; s/p bil TKR    PT Comments    Pt progressing well with mobility and hopeful for dc tomorrow.  Follow Up Recommendations  Home health PT     Equipment Recommendations  None recommended by PT    Recommendations for Other Services OT consult     Precautions / Restrictions Precautions Precautions: Fall Restrictions Weight Bearing Restrictions: No Other Position/Activity Restrictions: WBAT    Mobility  Bed Mobility Overal bed mobility: Needs Assistance Bed Mobility: Sit to Supine       Sit to supine: Min guard   General bed mobility comments: cues for sequence and use of R LE to self assist  Transfers Overall transfer level: Needs assistance Equipment used: Rolling walker (2 wheeled) Transfers: Sit to/from Stand Sit to Stand: Min guard         General transfer comment: cues for LE management and use of UEs to self assist  Ambulation/Gait Ambulation/Gait assistance: Min guard Ambulation Distance (Feet): 280 Feet Assistive device: Rolling walker (2 wheeled) Gait Pattern/deviations: Step-to pattern;Step-through pattern;Decreased step length - right;Decreased step length - left;Shuffle;Trunk flexed     General Gait Details: cues for posture, ER on R, position from RW and initial sequence   Stairs            Wheelchair Mobility    Modified Rankin (Stroke Patients Only)       Balance                                    Cognition Arousal/Alertness: Awake/alert Behavior During Therapy: WFL for tasks assessed/performed Overall Cognitive Status: Within Functional Limits for tasks assessed                      Exercises      General Comments        Pertinent Vitals/Pain Pain Assessment: 0-10 Pain Score: 3  Pain Location: R hip Pain Descriptors / Indicators:  Aching;Sore Pain Intervention(s): Limited activity within patient's tolerance;Monitored during session;Premedicated before session;Ice applied    Home Living                      Prior Function            PT Goals (current goals can now be found in the care plan section) Acute Rehab PT Goals Patient Stated Goal: Resume previous lifestyle with decreased pain PT Goal Formulation: With patient Time For Goal Achievement: 05/29/15 Potential to Achieve Goals: Good Progress towards PT goals: Progressing toward goals    Frequency  7X/week    PT Plan Current plan remains appropriate    Co-evaluation             End of Session Equipment Utilized During Treatment: Gait belt Activity Tolerance: Patient tolerated treatment well Patient left: in bed;with call bell/phone within reach;with family/visitor present     Time: 1520-1540 PT Time Calculation (min) (ACUTE ONLY): 20 min  Charges:  $Gait Training: 8-22 mins                    G Codes:      Eileen Perez 06/06/15, 4:57 PM

## 2015-05-27 NOTE — Progress Notes (Signed)
Physical Therapy Treatment Patient Details Name: Krisette Cloutier MRN: [id number] DOB: 10/29/1941 Today's Date: 05/27/2015    History of Present Illness R THR; s/p bil TKR    PT Comments    Pt very motivated and progressing well with mobility.  Follow Up Recommendations  Home health PT     Equipment Recommendations  None recommended by PT    Recommendations for Other Services OT consult     Precautions / Restrictions Precautions Precautions: Fall Restrictions Weight Bearing Restrictions: No Other Position/Activity Restrictions: WBAT    Mobility  Bed Mobility Overal bed mobility: Needs Assistance Bed Mobility: Supine to Sit     Supine to sit: Min assist     General bed mobility comments: pt up in chair  Transfers Overall transfer level: Needs assistance Equipment used: Rolling walker (2 wheeled) Transfers: Sit to/from Stand Sit to Stand: Min assist         General transfer comment: cues for LE management and use of UEs to self assist  Ambulation/Gait Ambulation/Gait assistance: Min assist Ambulation Distance (Feet): 190 Feet Assistive device: Rolling walker (2 wheeled) Gait Pattern/deviations: Step-to pattern;Step-through pattern;Decreased step length - right;Decreased step length - left;Shuffle;Trunk flexed     General Gait Details: cues for posture, position from RW and initial sequence   Stairs            Wheelchair Mobility    Modified Rankin (Stroke Patients Only)       Balance                                    Cognition Arousal/Alertness: Awake/alert Behavior During Therapy: WFL for tasks assessed/performed Overall Cognitive Status: Within Functional Limits for tasks assessed                      Exercises Total Joint Exercises Ankle Circles/Pumps: AROM;15 reps;Supine;Both Quad Sets: AROM;Both;10 reps;Supine Heel Slides: AAROM;Right;20 reps;Supine Hip ABduction/ADduction: AAROM;Right;15 reps;Supine     General Comments        Pertinent Vitals/Pain Pain Assessment: 0-10 Pain Score: 3  Pain Location: r hip Pain Descriptors / Indicators: Sore Pain Intervention(s): Monitored during session;Ice applied    Home Living Family/patient expects to be discharged to:: Private residence Living Arrangements: Alone Available Help at Discharge: Family;Available 24 hours/day Type of Home: House Home Access: Stairs to enter   Home Layout: One level Home Equipment: Gilford Rile - 2 wheels Additional Comments: Dtr can assist until this coming Monday - will be out of town for work    Prior Function Level of Independence: Independent;Independent with assistive device(s)          PT Goals (current goals can now be found in the care plan section) Acute Rehab PT Goals Patient Stated Goal: Resume previous lifestyle with decreased pain PT Goal Formulation: With patient Time For Goal Achievement: 05/29/15 Potential to Achieve Goals: Good Progress towards PT goals: Progressing toward goals    Frequency  7X/week    PT Plan Current plan remains appropriate    Co-evaluation             End of Session Equipment Utilized During Treatment: Gait belt Activity Tolerance: Patient tolerated treatment well Patient left: in chair;with call bell/phone within reach     Time: 0843-0906 PT Time Calculation (min) (ACUTE ONLY): 23 min  Charges:  $Gait Training: 8-22 mins $Therapeutic Exercise: 8-22 mins  G Codes:      Zavion Sleight May 28, 2015, 12:14 PM

## 2015-05-28 LAB — BASIC METABOLIC PANEL
ANION GAP: 7 (ref 5–15)
BUN: 64 mg/dL — ABNORMAL HIGH (ref 6–20)
CHLORIDE: 111 mmol/L (ref 101–111)
CO2: 19 mmol/L — ABNORMAL LOW (ref 22–32)
Calcium: 9.5 mg/dL (ref 8.9–10.3)
Creatinine, Ser: 4.27 mg/dL — ABNORMAL HIGH (ref 0.44–1.00)
GFR calc Af Amer: 11 mL/min — ABNORMAL LOW (ref >60)
GFR calc non Af Amer: 9 mL/min — ABNORMAL LOW (ref >60)
Glucose, Bld: 153 mg/dL — ABNORMAL HIGH (ref 65–99)
POTASSIUM: 5.5 mmol/L — AB (ref 3.5–5.1)
SODIUM: 137 mmol/L (ref 135–145)

## 2015-05-28 LAB — CBC
HEMATOCRIT: 27.9 % — AB (ref 36.0–46.0)
Hemoglobin: 8.8 g/dL — ABNORMAL LOW (ref 12.0–15.0)
MCH: 29.5 pg (ref 26.0–34.0)
MCHC: 31.5 g/dL (ref 30.0–36.0)
MCV: 93.6 fL (ref 78.0–100.0)
Platelets: 146 10*3/uL — ABNORMAL LOW (ref 150–400)
RBC: 2.98 MIL/uL — AB (ref 3.87–5.11)
RDW: 15.6 % — ABNORMAL HIGH (ref 11.5–15.5)
WBC: 8.2 10*3/uL (ref 4.0–10.5)

## 2015-05-28 LAB — GLUCOSE, CAPILLARY: GLUCOSE-CAPILLARY: 137 mg/dL — AB (ref 65–99)

## 2015-05-28 MED ORDER — APIXABAN 2.5 MG PO TABS: 2.5000 mg | ORAL_TABLET | Freq: Two times a day (BID) | ORAL | Status: DC

## 2015-05-28 MED ORDER — OXYCODONE HCL 5 MG PO TABS: 5.0000 mg | ORAL_TABLET | ORAL | Status: DC | PRN

## 2015-05-28 MED ORDER — METHOCARBAMOL 500 MG PO TABS: 500.0000 mg | ORAL_TABLET | Freq: Four times a day (QID) | ORAL | Status: DC | PRN

## 2015-05-28 MED ORDER — TRAMADOL HCL 50 MG PO TABS: 50.0000 mg | ORAL_TABLET | Freq: Four times a day (QID) | ORAL | Status: DC | PRN

## 2015-05-28 NOTE — Care Management (Signed)
Important Message  Patient Details  Name: Eileen Perez MRN: [id number] Date of Birth: July 06, 1941   Medicare Important Message Given:  Yes-second notification given    Camillo Flaming 05/28/2015, 12:03 PM

## 2015-05-28 NOTE — Progress Notes (Signed)
Physical Therapy Treatment Patient Details Name: Eileen Perez MRN: [id number] DOB: March 13, 1941 Today's Date: 05/28/2015    History of Present Illness R THR; s/p bil TKR    PT Comments    Pt progressing well and eager for dc.  Reviewed single step and car transfers.  Follow Up Recommendations  Home health PT     Equipment Recommendations  None recommended by PT    Recommendations for Other Services OT consult     Precautions / Restrictions Precautions Precautions: Fall Restrictions Weight Bearing Restrictions: No Other Position/Activity Restrictions: WBAT    Mobility  Bed Mobility Overal bed mobility: Needs Assistance Bed Mobility: Supine to Sit     Supine to sit: Supervision     General bed mobility comments: cues for sequence and use of R LE to self assist  Transfers Overall transfer level: Needs assistance Equipment used: Rolling walker (2 wheeled) Transfers: Sit to/from Stand Sit to Stand: Supervision         General transfer comment: min cues for LE management and use of UEs to self assist  Ambulation/Gait Ambulation/Gait assistance: Min guard;Supervision Ambulation Distance (Feet): 200 Feet Assistive device: Rolling walker (2 wheeled) Gait Pattern/deviations: Step-to pattern;Step-through pattern;Decreased step length - right;Decreased step length - left;Shuffle;Trunk flexed     General Gait Details: cues for posture, ER on R, position from RW and initial sequence   Stairs Stairs: Yes Stairs assistance: Min assist Stair Management: No rails;Step to pattern;Forwards;With walker Number of Stairs: 3 General stair comments: single step 3x with RW and cues for sequence and RW/foot placement; written instructions provided  Wheelchair Mobility    Modified Rankin (Stroke Patients Only)       Balance                                    Cognition Arousal/Alertness: Awake/alert Behavior During Therapy: WFL for tasks  assessed/performed Overall Cognitive Status: Within Functional Limits for tasks assessed                      Exercises Total Joint Exercises Ankle Circles/Pumps: AROM;15 reps;Supine;Both Quad Sets: AROM;Both;10 reps;Supine Gluteal Sets: AROM;Both;10 reps;Supine Heel Slides: AAROM;Right;20 reps;Supine Hip ABduction/ADduction: AAROM;Right;15 reps;Supine Long Arc Quad: AROM;Both;10 reps;Seated    General Comments        Pertinent Vitals/Pain Pain Assessment: 0-10 Pain Score: 2  Pain Location: R hip Pain Descriptors / Indicators: Aching;Sore Pain Intervention(s): Limited activity within patient's tolerance;Monitored during session;Premedicated before session    Home Living                      Prior Function            PT Goals (current goals can now be found in the care plan section) Acute Rehab PT Goals Patient Stated Goal: Resume previous lifestyle with decreased pain PT Goal Formulation: With patient Time For Goal Achievement: 05/29/15 Potential to Achieve Goals: Good Progress towards PT goals: Progressing toward goals    Frequency  7X/week    PT Plan Current plan remains appropriate    Co-evaluation             End of Session Equipment Utilized During Treatment: Gait belt Activity Tolerance: Patient tolerated treatment well Patient left: in chair;with call bell/phone within reach     Time: 0800-0829 PT Time Calculation (min) (ACUTE ONLY): 29 min  Charges:  $Gait Training: 8-22 mins $Therapeutic Exercise:  8-22 mins                    G Codes:      Eileen Perez 05-30-2015, 9:16 AM

## 2015-05-28 NOTE — Progress Notes (Addendum)
Occupational Therapy Treatment Patient Details Name: Lennea Rumford MRN: [id number] DOB: 08/06/1941 Today's Date: 05/28/2015    History of present illness R THR; s/p bil TKR   OT comments  Pt is making good progress in OT.  She will have help for one week at home.  She has h/o knee replacements and has good safety when ambulating in room.  One cue given for safety when doffing nightgown.  Follow Up Recommendations  Home Health OT;Supervision/Assistance - 24 hour initially   Equipment Recommendations  None recommended by OT    Recommendations for Other Services      Precautions / Restrictions Precautions Precautions: Fall Restrictions Weight Bearing Restrictions: No Other Position/Activity Restrictions: WBAT       Mobility Bed Mobility Overal bed mobility: Needs Assistance Bed Mobility: Supine to Sit     Supine to sit: Supervision     General bed mobility comments: cues for sequence and use of R LE to self assist  Transfers Overall transfer level: Needs assistance Equipment used: Rolling walker (2 wheeled) Transfers: Sit to/from Stand Sit to Stand: Modified independent (Device/Increase time)         General transfer comment: min cues for LE management and use of UEs to self assist    Balance                                   ADL Overall ADL's : Needs assistance/impaired     Grooming: Supervision/safety;Brushing hair;Standing       Lower Body Bathing: Minimal assistance;Sit to/from stand       Lower Body Dressing: Minimal assistance;Sit to/from stand                 General ADL Comments: pt performed ADL from recliner then walked to bathroom for grooming.  Good safety awareness with ambulation--cued to sit back down when taking nightgown off by slipping down over hips.    Pt is very independent.  She needs min A for socks; able to don pants and underwear herself.  She states that she will have help for a couple of day; will go without  socks if needed.  Reviewed tub readiness/technique.  She has a shower seat for her tub      Vision                     Perception     Praxis      Cognition   Behavior During Therapy: WFL for tasks assessed/performed Overall Cognitive Status: Within Functional Limits for tasks assessed                       Extremity/Trunk Assessment               Exercises    Shoulder Instructions       General Comments      Pertinent Vitals/ Pain       Pain Assessment: 0-10 Pain Score: 3  Pain Location: R hip Pain Descriptors / Indicators: Sore Pain Intervention(s): Limited activity within patient's tolerance;Monitored during session;Premedicated before session;Repositioned;Ice applied  Home Living                                          Prior Functioning/Environment  Frequency       Progress Toward Goals  OT Goals(current goals can now be found in the care plan section)  Progress towards OT goals: Progressing toward goals  Acute Rehab OT Goals Patient Stated Goal: Resume previous lifestyle with decreased pain  Plan      Co-evaluation                 End of Session     Activity Tolerance Patient tolerated treatment well   Patient Left in chair;with call bell/phone within reach   Nurse Communication          Time: [id number] OT Time Calculation (min): 25 min  Charges: OT General Charges $OT Visit: 1 Procedure OT Treatments $Self Care/Home Management : 23-37 mins  Morgana Rowley 05/28/2015, 9:26 AM Lesle Chris, OTR/L [phone] 05/28/2015

## 2015-05-28 NOTE — Progress Notes (Signed)
   Subjective: 2 Days Post-Op Procedure(s) (LRB): RIGHT TOTAL HIP ARTHROPLASTY ANTERIOR APPROACH (Right) Patient reports pain as mild.   Patient seen in rounds for Dr. Wynelle Link. Patient is well, but has had some minor complaints of pain in the hip, requiring pain medications Patient is ready to go home following therapy.  Objective: Vital signs in last 24 hours: Temp:  [97.9 F (36.6 C)-99.1 F (37.3 C)] 99.1 F (37.3 C) (07/08 0558) Pulse Rate:  [70-80] 80 (07/08 0558) Resp:  [16] 16 (07/08 0558) BP: (118-149)/(48-62) 144/62 mmHg (07/08 0558) SpO2:  [98 %-99 %] 98 % (07/08 0558)  Intake/Output from previous day:  Intake/Output Summary (Last 24 hours) at 05/28/15 0831 Last data filed at 05/28/15 0657  Gross per 24 hour  Intake 1071.25 ml  Output   1100 ml  Net -28.75 ml    Labs:  Recent Labs  05/27/15 0425 05/28/15 0445  HGB 8.2* 8.8*    Recent Labs  05/27/15 0425 05/28/15 0445  WBC 6.4 8.2  RBC 2.79* 2.98*  HCT 26.1* 27.9*  PLT 139* 146*    Recent Labs  05/27/15 0425 05/28/15 0445  NA 136 137  K 5.4* 5.5*  CL 110 111  CO2 19* 19*  BUN 59* 64*  CREATININE 3.98* 4.27*  GLUCOSE 131* 153*  CALCIUM 8.8* 9.5   No results for input(s): LABPT, INR in the last 72 hours.  EXAM: General - Patient is Alert, Appropriate and Oriented Extremity - Neurovascular intact Sensation intact distally Dorsiflexion/Plantar flexion intact Incision - clean, dry, no drainage, healing Motor Function - intact, moving foot and toes well on exam.   Assessment/Plan: 2 Days Post-Op Procedure(s) (LRB): RIGHT TOTAL HIP ARTHROPLASTY ANTERIOR APPROACH (Right) Procedure(s) (LRB): RIGHT TOTAL HIP ARTHROPLASTY ANTERIOR APPROACH (Right) Past Medical History  Diagnosis Date  . Diabetes mellitus     Pre  . Hypertension   . Arthritis     Osteoarthritis  . Anemia   . History of blood transfusion   . Chronic kidney disease     Chronic renal insufficiency  . Pancreatic cyst  1999  . Thyroid disease     Hyperparathyroid   . GERD (gastroesophageal reflux disease)   . Gout   . Osteopenia   . Diverticulosis   . Hiatal hernia   . Hyperlipidemia   . Heart murmur     hx of in childhood   Principal Problem:   OA (osteoarthritis) of hip  Estimated body mass index is 24.91 kg/(m^2) as calculated from the following:   Height as of this encounter: 5' 1.5" (1.562 m).   Weight as of this encounter: 60.782 kg (134 lb). Up with therapy Discharge home with home health Diet - Cardiac diet, Diabetic diet and Renal diet Follow up - in 2 weeks Activity - WBAT Disposition - Home Condition Upon Discharge - Good D/C Meds - See DC Summary DVT Prophylaxis - Eliquis  Arlee Muslim, PA-C Orthopaedic Surgery 05/28/2015, 8:31 AM

## 2015-05-28 NOTE — Discharge Summary (Signed)
Physician Discharge Summary   Patient ID: Eileen Perez MRN: [id number] DOB/AGE: 1941-06-25 74 y.o.  Admit date: 05/26/2015 Discharge date: 05/28/2015  Primary Diagnosis:  Osteoarthritis of the Right hip.   Admission Diagnoses:  Past Medical History  Diagnosis Date  . Diabetes mellitus     Pre  . Hypertension   . Arthritis     Osteoarthritis  . Anemia   . History of blood transfusion   . Chronic kidney disease     Chronic renal insufficiency  . Pancreatic cyst 1999  . Thyroid disease     Hyperparathyroid   . GERD (gastroesophageal reflux disease)   . Gout   . Osteopenia   . Diverticulosis   . Hiatal hernia   . Hyperlipidemia   . Heart murmur     hx of in childhood   Discharge Diagnoses:   Principal Problem:   OA (osteoarthritis) of hip  Estimated body mass index is 24.91 kg/(m^2) as calculated from the following:   Height as of this encounter: 5' 1.5" (1.562 m).   Weight as of this encounter: 60.782 kg (134 lb).  Procedure(s) (LRB): RIGHT TOTAL HIP ARTHROPLASTY ANTERIOR APPROACH (Right)   Consults: None  HPI: Eileen Perez is a 74 y.o. female who has advanced end-  stage arthritis of her Right hip with progressively worsening pain and  dysfunction.The patient has failed nonoperative management and presents for  total hip arthroplasty.   Laboratory Data: Admission on 05/26/2015  Component Date Value Ref Range Status  . Glucose-Capillary 05/26/2015 109* 65 - 99 mg/dL Final  . Sodium 05/26/2015 141  135 - 145 mmol/L Final  . Potassium 05/26/2015 5.2* 3.5 - 5.1 mmol/L Final   NO VISIBLE HEMOLYSIS  . Chloride 05/26/2015 112* 101 - 111 mmol/L Final  . CO2 05/26/2015 20* 22 - 32 mmol/L Final  . Glucose, Bld 05/26/2015 123* 65 - 99 mg/dL Final  . BUN 05/26/2015 57* 6 - 20 mg/dL Final  . Creatinine, Ser 05/26/2015 4.11* 0.44 - 1.00 mg/dL Final  . Calcium 05/26/2015 10.3  8.9 - 10.3 mg/dL Final  . GFR calc non Af Amer 05/26/2015 10* >60 mL/min Final  . GFR calc  Af Amer 05/26/2015 11* >60 mL/min Final   Comment: (NOTE) The eGFR has been calculated using the CKD EPI equation. This calculation has not been validated in all clinical situations. eGFR's persistently <60 mL/min signify possible Chronic Kidney Disease.   . Anion gap 05/26/2015 9  5 - 15 Final  . Glucose-Capillary 05/26/2015 94  65 - 99 mg/dL Final  . Comment 1 05/26/2015 Notify RN   Final  . Comment 2 05/26/2015 Document in Chart   Final  . WBC 05/27/2015 6.4  4.0 - 10.5 K/uL Final  . RBC 05/27/2015 2.79* 3.87 - 5.11 MIL/uL Final  . Hemoglobin 05/27/2015 8.2* 12.0 - 15.0 g/dL Final  . HCT 05/27/2015 26.1* 36.0 - 46.0 % Final  . MCV 05/27/2015 93.5  78.0 - 100.0 fL Final  . MCH 05/27/2015 29.4  26.0 - 34.0 pg Final  . MCHC 05/27/2015 31.4  30.0 - 36.0 g/dL Final  . RDW 05/27/2015 15.3  11.5 - 15.5 % Final  . Platelets 05/27/2015 139* 150 - 400 K/uL Final  . Sodium 05/27/2015 136  135 - 145 mmol/L Final  . Potassium 05/27/2015 5.4* 3.5 - 5.1 mmol/L Final   NO VISIBLE HEMOLYSIS  . Chloride 05/27/2015 110  101 - 111 mmol/L Final  . CO2 05/27/2015 19* 22 - 32 mmol/L Final  .  Glucose, Bld 05/27/2015 131* 65 - 99 mg/dL Final  . BUN 05/27/2015 59* 6 - 20 mg/dL Final  . Creatinine, Ser 05/27/2015 3.98* 0.44 - 1.00 mg/dL Final  . Calcium 05/27/2015 8.8* 8.9 - 10.3 mg/dL Final  . GFR calc non Af Amer 05/27/2015 10* >60 mL/min Final  . GFR calc Af Amer 05/27/2015 12* >60 mL/min Final   Comment: (NOTE) The eGFR has been calculated using the CKD EPI equation. This calculation has not been validated in all clinical situations. eGFR's persistently <60 mL/min signify possible Chronic Kidney Disease.   . Anion gap 05/27/2015 7  5 - 15 Final  . WBC 05/28/2015 8.2  4.0 - 10.5 K/uL Final  . RBC 05/28/2015 2.98* 3.87 - 5.11 MIL/uL Final  . Hemoglobin 05/28/2015 8.8* 12.0 - 15.0 g/dL Final  . HCT 05/28/2015 27.9* 36.0 - 46.0 % Final  . MCV 05/28/2015 93.6  78.0 - 100.0 fL Final  . MCH  05/28/2015 29.5  26.0 - 34.0 pg Final  . MCHC 05/28/2015 31.5  30.0 - 36.0 g/dL Final  . RDW 05/28/2015 15.6* 11.5 - 15.5 % Final  . Platelets 05/28/2015 146* 150 - 400 K/uL Final  . Sodium 05/28/2015 137  135 - 145 mmol/L Final  . Potassium 05/28/2015 5.5* 3.5 - 5.1 mmol/L Final  . Chloride 05/28/2015 111  101 - 111 mmol/L Final  . CO2 05/28/2015 19* 22 - 32 mmol/L Final  . Glucose, Bld 05/28/2015 153* 65 - 99 mg/dL Final  . BUN 05/28/2015 64* 6 - 20 mg/dL Final  . Creatinine, Ser 05/28/2015 4.27* 0.44 - 1.00 mg/dL Final  . Calcium 05/28/2015 9.5  8.9 - 10.3 mg/dL Final  . GFR calc non Af Amer 05/28/2015 9* >60 mL/min Final  . GFR calc Af Amer 05/28/2015 11* >60 mL/min Final   Comment: (NOTE) The eGFR has been calculated using the CKD EPI equation. This calculation has not been validated in all clinical situations. eGFR's persistently <60 mL/min signify possible Chronic Kidney Disease.   . Anion gap 05/28/2015 7  5 - 15 Final  . Glucose-Capillary 05/28/2015 137* 65 - 99 mg/dL Final  Hospital Outpatient Visit on 05/19/2015  Component Date Value Ref Range Status  . aPTT 05/19/2015 34  24 - 37 seconds Final  . WBC 05/19/2015 4.5  4.0 - 10.5 K/uL Final  . RBC 05/19/2015 3.84* 3.87 - 5.11 MIL/uL Final  . Hemoglobin 05/19/2015 11.0* 12.0 - 15.0 g/dL Final  . HCT 05/19/2015 36.3  36.0 - 46.0 % Final  . MCV 05/19/2015 94.5  78.0 - 100.0 fL Final  . MCH 05/19/2015 28.6  26.0 - 34.0 pg Final  . MCHC 05/19/2015 30.3  30.0 - 36.0 g/dL Final  . RDW 05/19/2015 16.2* 11.5 - 15.5 % Final  . Platelets 05/19/2015 194  150 - 400 K/uL Final  . Sodium 05/19/2015 141  135 - 145 mmol/L Final  . Potassium 05/19/2015 6.0* 3.5 - 5.1 mmol/L Final   NO VISIBLE HEMOLYSIS  . Chloride 05/19/2015 110  101 - 111 mmol/L Final  . CO2 05/19/2015 22  22 - 32 mmol/L Final  . Glucose, Bld 05/19/2015 113* 65 - 99 mg/dL Final  . BUN 05/19/2015 53* 6 - 20 mg/dL Final  . Creatinine, Ser 05/19/2015 3.86* 0.44 - 1.00  mg/dL Final  . Calcium 05/19/2015 10.2  8.9 - 10.3 mg/dL Final  . Total Protein 05/19/2015 6.9  6.5 - 8.1 g/dL Final  . Albumin 05/19/2015 3.6  3.5 - 5.0 g/dL  Final  . AST 05/19/2015 26  15 - 41 U/L Final  . ALT 05/19/2015 22  14 - 54 U/L Final  . Alkaline Phosphatase 05/19/2015 162* 38 - 126 U/L Final  . Total Bilirubin 05/19/2015 0.7  0.3 - 1.2 mg/dL Final  . GFR calc non Af Amer 05/19/2015 10* >60 mL/min Final  . GFR calc Af Amer 05/19/2015 12* >60 mL/min Final   Comment: (NOTE) The eGFR has been calculated using the CKD EPI equation. This calculation has not been validated in all clinical situations. eGFR's persistently <60 mL/min signify possible Chronic Kidney Disease.   . Anion gap 05/19/2015 9  5 - 15 Final  . Prothrombin Time 05/19/2015 14.0  11.6 - 15.2 seconds Final  . INR 05/19/2015 1.06  0.00 - 1.49 Final  . Color, Urine 05/19/2015 YELLOW  YELLOW Final  . APPearance 05/19/2015 CLEAR  CLEAR Final  . Specific Gravity, Urine 05/19/2015 1.011  1.005 - 1.030 Final  . pH 05/19/2015 5.5  5.0 - 8.0 Final  . Glucose, UA 05/19/2015 NEGATIVE  NEGATIVE mg/dL Final  . Hgb urine dipstick 05/19/2015 NEGATIVE  NEGATIVE Final  . Bilirubin Urine 05/19/2015 NEGATIVE  NEGATIVE Final  . Ketones, ur 05/19/2015 NEGATIVE  NEGATIVE mg/dL Final  . Protein, ur 05/19/2015 >300* NEGATIVE mg/dL Final  . Urobilinogen, UA 05/19/2015 0.2  0.0 - 1.0 mg/dL Final  . Nitrite 05/19/2015 NEGATIVE  NEGATIVE Final  . Leukocytes, UA 05/19/2015 NEGATIVE  NEGATIVE Final  . MRSA, PCR 05/19/2015 NEGATIVE  NEGATIVE Final  . Staphylococcus aureus 05/19/2015 NEGATIVE  NEGATIVE Final   Comment:        The Xpert SA Assay (FDA approved for NASAL specimens in patients over 39 years of age), is one component of a comprehensive surveillance program.  Test performance has been validated by [facility] for patients greater than or equal to 71 year old. It is not intended to diagnose infection nor to guide or monitor  treatment.   . ABO/RH(D) 05/19/2015 O POS   Final  . Antibody Screen 05/19/2015 NEG   Final  . Sample Expiration 05/19/2015 05/29/2015   Final  . WBC, UA 05/19/2015 0-2  <3 WBC/hpf Final  . RBC / HPF 05/19/2015 3-6  <3 RBC/hpf Final  . Bacteria, UA 05/19/2015 RARE  RARE Final  . Casts 05/19/2015 HYALINE CASTS* NEGATIVE Final  . Edwina Barth 05/19/2015 MUCOUS PRESENT   Final  Hospital Outpatient Visit on 05/07/2015  Component Date Value Ref Range Status  . Iron 05/07/2015 93  28 - 170 ug/dL Final  . TIBC 05/07/2015 245* 250 - 450 ug/dL Final  . Saturation Ratios 05/07/2015 38* 10.4 - 31.8 % Final  . UIBC 05/07/2015 152   Final  . Ferritin 05/07/2015 436* 11 - 307 ng/mL Final  . Sodium 05/07/2015 140  135 - 145 mmol/L Final  . Potassium 05/07/2015 5.1  3.5 - 5.1 mmol/L Final  . Chloride 05/07/2015 114* 101 - 111 mmol/L Final  . CO2 05/07/2015 19* 22 - 32 mmol/L Final  . Glucose, Bld 05/07/2015 112* 65 - 99 mg/dL Final  . BUN 05/07/2015 78* 6 - 20 mg/dL Final  . Creatinine, Ser 05/07/2015 3.85* 0.44 - 1.00 mg/dL Final  . Calcium 05/07/2015 10.2  8.9 - 10.3 mg/dL Final  . Phosphorus 05/07/2015 4.1  2.5 - 4.6 mg/dL Final  . Albumin 05/07/2015 3.3* 3.5 - 5.0 g/dL Final  . GFR calc non Af Amer 05/07/2015 11* >60 mL/min Final  . GFR calc Af Wyvonnia Lora 05/07/2015  12* >60 mL/min Final   Comment: (NOTE) The eGFR has been calculated using the CKD EPI equation. This calculation has not been validated in all clinical situations. eGFR's persistently <60 mL/min signify possible Chronic Kidney Disease.   . Anion gap 05/07/2015 7  5 - 15 Final  . PTH 05/07/2015 340* 15 - 65 pg/mL Final  . Calcium, Total (PTH) 05/07/2015 9.9  8.7 - 10.3 mg/dL Final  . PTH 05/07/2015 Comment   Final   Comment: (NOTE) Interpretation                 Intact PTH    Calcium                                (pg/mL)      (mg/dL) Normal                          15 - 65     8.6 - 10.2 Primary Hyperparathyroidism         >65           >10.2 Secondary Hyperparathyroidism       >65          <10.2 Non-Parathyroid Hypercalcemia       <65          >10.2 Hypoparathyroidism                  <15          < 8.6 Non-Parathyroid Hypocalcemia    15 - 65          < 8.6 Performed At: [facility] [place], [place] [id number] Lindon Romp MD [id number]   . Hemoglobin 05/07/2015 10.9* 12.0 - 15.0 g/dL Final  Hospital Outpatient Visit on 04/09/2015  Component Date Value Ref Range Status  . Iron 04/09/2015 79  28 - 170 ug/dL Final  . TIBC 04/09/2015 242* 250 - 450 ug/dL Final  . Saturation Ratios 04/09/2015 33* 10.4 - 31.8 % Final  . UIBC 04/09/2015 163   Final  . Ferritin 04/09/2015 478* 11 - 307 ng/mL Final  . Sodium 04/09/2015 141  135 - 145 mmol/L Final  . Potassium 04/09/2015 4.9  3.5 - 5.1 mmol/L Final  . Chloride 04/09/2015 114* 101 - 111 mmol/L Final  . CO2 04/09/2015 19* 22 - 32 mmol/L Final  . Glucose, Bld 04/09/2015 105* 65 - 99 mg/dL Final  . BUN 04/09/2015 58* 6 - 20 mg/dL Final  . Creatinine, Ser 04/09/2015 3.75* 0.44 - 1.00 mg/dL Final  . Calcium 04/09/2015 10.0  8.9 - 10.3 mg/dL Final  . Phosphorus 04/09/2015 4.1  2.5 - 4.6 mg/dL Final  . Albumin 04/09/2015 3.5  3.5 - 5.0 g/dL Final  . GFR calc non Af Amer 04/09/2015 11* >60 mL/min Final  . GFR calc Af Amer 04/09/2015 13* >60 mL/min Final   Comment: (NOTE) The eGFR has been calculated using the CKD EPI equation. This calculation has not been validated in all clinical situations. eGFR's persistently <60 mL/min signify possible Chronic Kidney Disease.   . Anion gap 04/09/2015 8  5 - 15 Final  . PTH 04/09/2015 350* 15 - 65 pg/mL Final  . Calcium, Total (PTH) 04/09/2015 9.8  8.7 - 10.3 mg/dL Final  . PTH 04/09/2015 Comment   Final   Comment: (NOTE) Interpretation  Intact PTH    Calcium                                (pg/mL)      (mg/dL) Normal                          15 - 65     8.6 - 10.2 Primary  Hyperparathyroidism         >65          >10.2 Secondary Hyperparathyroidism       >65          <10.2 Non-Parathyroid Hypercalcemia       <65          >10.2 Hypoparathyroidism                  <15          < 8.6 Non-Parathyroid Hypocalcemia    15 - 65          < 8.6 Performed At: [facility] [place], [place] [id number] Lindon Romp MD EZ:[id number]   . Hemoglobin 04/09/2015 10.4* 12.0 - 15.0 g/dL Final     X-Rays:Dg Chest 2 View  05/19/2015   CLINICAL DATA:  Preop.  EXAM: CHEST  2 VIEW  COMPARISON:  06/02/2013.  FINDINGS: Trachea is midline. Heart size normal. Lungs are hyperinflated with pleural parenchymal scarring at the lateral base of the left hemi thorax. No airspace consolidation or pleural fluid.  IMPRESSION: Hyperinflation without acute finding.   Electronically Signed   By: Lorin Picket M.D.   On: 05/19/2015 11:10   Dg C-arm 1-60 Min-no Report  05/26/2015   CLINICAL DATA: hip   C-ARM 1-60 MINUTES  Fluoroscopy was utilized by the requesting physician.  No radiographic  interpretation.     EKG: Orders placed or performed in visit on 04/23/15  . EKG 12-Lead     Hospital Course: Patient was admitted to [facility] and taken to the OR and underwent the above state procedure without complications.  Patient tolerated the procedure well and was later transferred to the recovery room and then to the orthopaedic floor for postoperative care.  They were given PO and IV analgesics for pain control following their surgery.  They were given 24 hours of postoperative antibiotics of  Anti-infectives    Start     Dose/Rate Route Frequency Ordered Stop   05/26/15 1600  ceFAZolin (ANCEF) IVPB 2 g/50 mL premix     2 g 100 mL/hr over 30 Minutes Intravenous Every 6 hours 05/26/15 1120 05/26/15 2224   05/26/15 0639  ceFAZolin (ANCEF) IVPB 2 g/50 mL premix     2 g 100 mL/hr over 30 Minutes Intravenous On call to O.R. 05/26/15 5035 05/26/15 0845     and  started on DVT prophylaxis in the form of Eliquis.   PT and OT were ordered for total hip protocol.  The patient was allowed to be WBAT with therapy. Discharge planning was consulted to help with postop disposition and equipment needs.  Patient had a tough night on the evening of surgery with some pain down into the knee likely from the traction.  She did walk about 75 feet on the evening of surgery. They started to get up OOB with therapy again on day one.  Hemovac drain was pulled without difficulty.  Continued to work with  therapy into day two.  Dressing was changed on day two and the incision was healing well.  Patient was seen in rounds and was ready to go home.  Diet - Cardiac diet, Diabetic diet and Renal diet Follow up - in 2 weeks Activity - WBAT Disposition - Home Condition Upon Discharge - Good D/C Meds - See DC Summary DVT Prophylaxis - Eliquis   Discharge Instructions    Call MD / Call 911    Complete by:  As directed   If you experience chest pain or shortness of breath, CALL 911 and be transported to the hospital emergency room.  If you develope a fever above 101 F, pus (white drainage) or increased drainage or redness at the wound, or calf pain, call your surgeon's office.     Change dressing    Complete by:  As directed   You may change your dressing dressing daily with sterile 4 x 4 inch gauze dressing and paper tape.  Do not submerge the incision under water.     Constipation Prevention    Complete by:  As directed   Drink plenty of fluids.  Prune juice may be helpful.  You may use a stool softener, such as Colace (over the counter) 100 mg twice a day.  Use MiraLax (over the counter) for constipation as needed.     Diet - low sodium heart healthy    Complete by:  As directed      Diet Carb Modified    Complete by:  As directed      Discharge instructions    Complete by:  As directed   Pick up stool softner and laxative for home use following surgery while on pain  medications. Do not submerge incision under water. Please use good hand washing techniques while changing dressing each day. May shower starting three days after surgery. Please use a clean towel to pat the incision dry following showers. Continue to use ice for pain and swelling after surgery. Do not use any lotions or creams on the incision until instructed by your surgeon.  Total Hip Protocol.  Take Eliquis for two and a half more weeks, then discontinue the Eliquis. Once the patient has completed the Eliquis, they may resume the 81 mg Aspirin.  Postoperative Constipation Protocol  Constipation - defined medically as fewer than three stools per week and severe constipation as less than one stool per week.  One of the most common issues patients have following surgery is constipation.  Even if you have a regular bowel pattern at home, your normal regimen is likely to be disrupted due to multiple reasons following surgery.  Combination of anesthesia, postoperative narcotics, change in appetite and fluid intake all can affect your bowels.  In order to avoid complications following surgery, here are some recommendations in order to help you during your recovery period.  Colace (docusate) - Pick up an over-the-counter form of Colace or another stool softener and take twice a day as long as you are requiring postoperative pain medications.  Take with a full glass of water daily.  If you experience loose stools or diarrhea, hold the colace until you stool forms back up.  If your symptoms do not get better within 1 week or if they get worse, check with your doctor.  Dulcolax (bisacodyl) - Pick up over-the-counter and take as directed by the product packaging as needed to assist with the movement of your bowels.  Take with a full glass of water.  Use this product as needed if not relieved by Colace only.   MiraLax (polyethylene glycol) - Pick up over-the-counter to have on hand.  MiraLax is a  solution that will increase the amount of water in your bowels to assist with bowel movements.  Take as directed and can mix with a glass of water, juice, soda, coffee, or tea.  Take if you go more than two days without a movement. Do not use MiraLax more than once per day. Call your doctor if you are still constipated or irregular after using this medication for 7 days in a row.  If you continue to have problems with postoperative constipation, please contact the office for further assistance and recommendations.  If you experience "the worst abdominal pain ever" or develop nausea or vomiting, please contact the office immediatly for further recommendations for treatment.     Do not sit on low chairs, stoools or toilet seats, as it may be difficult to get up from low surfaces    Complete by:  As directed      Driving restrictions    Complete by:  As directed   No driving until released by the physician.     Increase activity slowly as tolerated    Complete by:  As directed      Lifting restrictions    Complete by:  As directed   No lifting until released by the physician.     Patient may shower    Complete by:  As directed   You may shower without a dressing once there is no drainage.  Do not wash over the wound.  If drainage remains, do not shower until drainage stops.     TED hose    Complete by:  As directed   Use stockings (TED hose) for 3 weeks on both leg(s).  You may remove them at night for sleeping.     Weight bearing as tolerated    Complete by:  As directed   Laterality:  right  Extremity:  Lower            Medication List    STOP taking these medications        Arnica Gel     aspirin 81 MG tablet     calcium carbonate 600 MG Tabs tablet  Commonly known as:  OS-CAL     Fish Oil 1000 MG Caps     FLAXSEED OIL PO     HM VITAMIN D3 4000 UNITS Caps  Generic drug:  Cholecalciferol     HYDROcodone-acetaminophen 5-325 MG per tablet  Commonly known as:   NORCO/VICODIN     multivitamin with minerals tablet     NON FORMULARY     RED YEAST RICE PO      TAKE these medications        ACCU-CHEK AVIVA PLUS test strip  Generic drug:  glucose blood  Test once daily     allopurinol 300 MG tablet  Commonly known as:  ZYLOPRIM  Take 1/2 to 1 tab daily as directed.     amLODipine 5 MG tablet  Commonly known as:  NORVASC  Take 2.5 mg by mouth at bedtime.     apixaban 2.5 MG Tabs tablet  Commonly known as:  ELIQUIS  Take 1 tablet (2.5 mg total) by mouth every 12 (twelve) hours. Take Eliquis twice a day for two and a half more weeks, then discontinue the Eliquis Once the patient has completed the Eliquis, they may resume  the 81 mg Aspirin.     COLACE 100 MG capsule  Generic drug:  docusate sodium  Take 100 mg by mouth 2 (two) times daily.     hydrALAZINE 10 MG tablet  Commonly known as:  APRESOLINE  Take 2 tablets (20 mg total) by mouth 2 (two) times daily.     labetalol 200 MG tablet  Commonly known as:  NORMODYNE  Take 1 tablet (200 mg total) by mouth 2 (two) times daily.     methocarbamol 500 MG tablet  Commonly known as:  ROBAXIN  Take 1 tablet (500 mg total) by mouth every 6 (six) hours as needed for muscle spasms.     oxyCODONE 5 MG immediate release tablet  Commonly known as:  Oxy IR/ROXICODONE  Take 1-2 tablets (5-10 mg total) by mouth every 3 (three) hours as needed for moderate pain or severe pain.     SLOW FE PO  Take 45 mg by mouth 2 (two) times daily.     traMADol 50 MG tablet  Commonly known as:  ULTRAM  Take 1-2 tablets (50-100 mg total) by mouth every 6 (six) hours as needed (mild pain).     WELCHOL 3.75 G Pack  Generic drug:  Colesevelam HCl  Take 3.75 each by mouth at bedtime.     zinc gluconate 50 MG tablet  Take 50 mg by mouth every morning.           Follow-up Information    Follow up with [facility].   Why:  3n1 (over the commode seat)   Contact information:   [address] [place] [address] [phone]       Follow up with [facility].   Why:  home health physical therapy   Contact information:   [address] [place] [address] [phone]       Follow up with Gearlean Alf, MD. Schedule an appointment as soon as possible for a visit on 06/10/2015.   Specialty:  Orthopedic Surgery   Why:  Call office at [phone] to setup appointment on Thursday 06/10/2015 with Dr. Wynelle Link.   Contact information:   [address] [place] [address] [phone]       Signed: Arlee Muslim, PA-C Orthopaedic Surgery 05/28/2015, 8:42 AM

## 2015-05-29 DIAGNOSIS — D638 Anemia in other chronic diseases classified elsewhere: Secondary | ICD-10-CM | POA: Diagnosis not present

## 2015-05-29 DIAGNOSIS — Z96641 Presence of right artificial hip joint: Secondary | ICD-10-CM | POA: Diagnosis not present

## 2015-05-29 DIAGNOSIS — Z471 Aftercare following joint replacement surgery: Secondary | ICD-10-CM | POA: Diagnosis not present

## 2015-05-29 DIAGNOSIS — N184 Chronic kidney disease, stage 4 (severe): Secondary | ICD-10-CM | POA: Diagnosis not present

## 2015-05-29 DIAGNOSIS — E1129 Type 2 diabetes mellitus with other diabetic kidney complication: Secondary | ICD-10-CM | POA: Diagnosis not present

## 2015-05-29 DIAGNOSIS — I129 Hypertensive chronic kidney disease with stage 1 through stage 4 chronic kidney disease, or unspecified chronic kidney disease: Secondary | ICD-10-CM | POA: Diagnosis not present

## 2015-05-31 DIAGNOSIS — N184 Chronic kidney disease, stage 4 (severe): Secondary | ICD-10-CM | POA: Diagnosis not present

## 2015-05-31 DIAGNOSIS — E1129 Type 2 diabetes mellitus with other diabetic kidney complication: Secondary | ICD-10-CM | POA: Diagnosis not present

## 2015-05-31 DIAGNOSIS — Z471 Aftercare following joint replacement surgery: Secondary | ICD-10-CM | POA: Diagnosis not present

## 2015-05-31 DIAGNOSIS — D638 Anemia in other chronic diseases classified elsewhere: Secondary | ICD-10-CM | POA: Diagnosis not present

## 2015-05-31 DIAGNOSIS — M130 Polyarthritis, unspecified: Secondary | ICD-10-CM | POA: Diagnosis not present

## 2015-05-31 DIAGNOSIS — I129 Hypertensive chronic kidney disease with stage 1 through stage 4 chronic kidney disease, or unspecified chronic kidney disease: Secondary | ICD-10-CM | POA: Diagnosis not present

## 2015-05-31 DIAGNOSIS — M169 Osteoarthritis of hip, unspecified: Secondary | ICD-10-CM | POA: Diagnosis not present

## 2015-05-31 DIAGNOSIS — Z96641 Presence of right artificial hip joint: Secondary | ICD-10-CM | POA: Diagnosis not present

## 2015-06-02 DIAGNOSIS — I129 Hypertensive chronic kidney disease with stage 1 through stage 4 chronic kidney disease, or unspecified chronic kidney disease: Secondary | ICD-10-CM | POA: Diagnosis not present

## 2015-06-02 DIAGNOSIS — E1129 Type 2 diabetes mellitus with other diabetic kidney complication: Secondary | ICD-10-CM | POA: Diagnosis not present

## 2015-06-02 DIAGNOSIS — N184 Chronic kidney disease, stage 4 (severe): Secondary | ICD-10-CM | POA: Diagnosis not present

## 2015-06-02 DIAGNOSIS — Z471 Aftercare following joint replacement surgery: Secondary | ICD-10-CM | POA: Diagnosis not present

## 2015-06-02 DIAGNOSIS — D638 Anemia in other chronic diseases classified elsewhere: Secondary | ICD-10-CM | POA: Diagnosis not present

## 2015-06-02 DIAGNOSIS — Z96641 Presence of right artificial hip joint: Secondary | ICD-10-CM | POA: Diagnosis not present

## 2015-06-04 ENCOUNTER — Encounter (HOSPITAL_COMMUNITY): Payer: Medicare Other

## 2015-06-04 DIAGNOSIS — N184 Chronic kidney disease, stage 4 (severe): Secondary | ICD-10-CM | POA: Diagnosis not present

## 2015-06-04 DIAGNOSIS — D638 Anemia in other chronic diseases classified elsewhere: Secondary | ICD-10-CM | POA: Diagnosis not present

## 2015-06-04 DIAGNOSIS — Z471 Aftercare following joint replacement surgery: Secondary | ICD-10-CM | POA: Diagnosis not present

## 2015-06-04 DIAGNOSIS — I129 Hypertensive chronic kidney disease with stage 1 through stage 4 chronic kidney disease, or unspecified chronic kidney disease: Secondary | ICD-10-CM | POA: Diagnosis not present

## 2015-06-04 DIAGNOSIS — E1129 Type 2 diabetes mellitus with other diabetic kidney complication: Secondary | ICD-10-CM | POA: Diagnosis not present

## 2015-06-04 DIAGNOSIS — Z96641 Presence of right artificial hip joint: Secondary | ICD-10-CM | POA: Diagnosis not present

## 2015-06-07 DIAGNOSIS — Z471 Aftercare following joint replacement surgery: Secondary | ICD-10-CM | POA: Diagnosis not present

## 2015-06-07 DIAGNOSIS — I129 Hypertensive chronic kidney disease with stage 1 through stage 4 chronic kidney disease, or unspecified chronic kidney disease: Secondary | ICD-10-CM | POA: Diagnosis not present

## 2015-06-07 DIAGNOSIS — Z96641 Presence of right artificial hip joint: Secondary | ICD-10-CM | POA: Diagnosis not present

## 2015-06-07 DIAGNOSIS — N184 Chronic kidney disease, stage 4 (severe): Secondary | ICD-10-CM | POA: Diagnosis not present

## 2015-06-07 DIAGNOSIS — E1129 Type 2 diabetes mellitus with other diabetic kidney complication: Secondary | ICD-10-CM | POA: Diagnosis not present

## 2015-06-07 DIAGNOSIS — D638 Anemia in other chronic diseases classified elsewhere: Secondary | ICD-10-CM | POA: Diagnosis not present

## 2015-06-08 DIAGNOSIS — N184 Chronic kidney disease, stage 4 (severe): Secondary | ICD-10-CM | POA: Diagnosis not present

## 2015-06-08 DIAGNOSIS — D638 Anemia in other chronic diseases classified elsewhere: Secondary | ICD-10-CM | POA: Diagnosis not present

## 2015-06-08 DIAGNOSIS — I129 Hypertensive chronic kidney disease with stage 1 through stage 4 chronic kidney disease, or unspecified chronic kidney disease: Secondary | ICD-10-CM | POA: Diagnosis not present

## 2015-06-08 DIAGNOSIS — E785 Hyperlipidemia, unspecified: Secondary | ICD-10-CM | POA: Diagnosis not present

## 2015-06-09 DIAGNOSIS — Z471 Aftercare following joint replacement surgery: Secondary | ICD-10-CM | POA: Diagnosis not present

## 2015-06-09 DIAGNOSIS — Z96641 Presence of right artificial hip joint: Secondary | ICD-10-CM | POA: Diagnosis not present

## 2015-06-09 DIAGNOSIS — E1129 Type 2 diabetes mellitus with other diabetic kidney complication: Secondary | ICD-10-CM | POA: Diagnosis not present

## 2015-06-09 DIAGNOSIS — I129 Hypertensive chronic kidney disease with stage 1 through stage 4 chronic kidney disease, or unspecified chronic kidney disease: Secondary | ICD-10-CM | POA: Diagnosis not present

## 2015-06-09 DIAGNOSIS — D638 Anemia in other chronic diseases classified elsewhere: Secondary | ICD-10-CM | POA: Diagnosis not present

## 2015-06-09 DIAGNOSIS — N184 Chronic kidney disease, stage 4 (severe): Secondary | ICD-10-CM | POA: Diagnosis not present

## 2015-06-17 ENCOUNTER — Other Ambulatory Visit (HOSPITAL_COMMUNITY): Payer: Self-pay | Admitting: *Deleted

## 2015-06-18 ENCOUNTER — Encounter (HOSPITAL_COMMUNITY)
Admission: RE | Admit: 2015-06-18 | Discharge: 2015-06-18 | Disposition: A | Discharge status: Home / Self Care | Payer: Medicare Other | Source: Ambulatory Visit | Attending: Nephrology | Admitting: Nephrology

## 2015-06-18 DIAGNOSIS — N183 Chronic kidney disease, stage 3 (moderate): Secondary | ICD-10-CM | POA: Insufficient documentation

## 2015-06-18 DIAGNOSIS — D631 Anemia in chronic kidney disease: Secondary | ICD-10-CM | POA: Insufficient documentation

## 2015-06-18 LAB — RENAL FUNCTION PANEL
ALBUMIN: 2.9 g/dL — AB (ref 3.5–5.0)
Anion gap: 8 (ref 5–15)
BUN: 76 mg/dL — AB (ref 6–20)
CALCIUM: 9.6 mg/dL (ref 8.9–10.3)
CHLORIDE: 115 mmol/L — AB (ref 101–111)
CO2: 19 mmol/L — ABNORMAL LOW (ref 22–32)
Creatinine, Ser: 4.31 mg/dL — ABNORMAL HIGH (ref 0.44–1.00)
GFR calc Af Amer: 11 mL/min — ABNORMAL LOW (ref >60)
GFR calc non Af Amer: 9 mL/min — ABNORMAL LOW (ref >60)
Glucose, Bld: 100 mg/dL — ABNORMAL HIGH (ref 65–99)
PHOSPHORUS: 4.7 mg/dL — AB (ref 2.5–4.6)
POTASSIUM: 5.1 mmol/L (ref 3.5–5.1)
Sodium: 142 mmol/L (ref 135–145)

## 2015-06-18 LAB — IRON AND TIBC
IRON: 48 ug/dL (ref 28–170)
SATURATION RATIOS: 20 % (ref 10.4–31.8)
TIBC: 241 ug/dL — ABNORMAL LOW (ref 250–450)
UIBC: 193 ug/dL

## 2015-06-18 LAB — FERRITIN: Ferritin: 511 ng/mL — ABNORMAL HIGH (ref 11–307)

## 2015-06-18 LAB — POCT HEMOGLOBIN-HEMACUE: HEMOGLOBIN: 7.9 g/dL — AB (ref 12.0–15.0)

## 2015-06-18 MED ORDER — EPOETIN ALFA 20000 UNIT/ML IJ SOLN
20000.0000 [IU] | INTRAMUSCULAR | Status: DC
Start: 2015-06-18 — End: 2015-06-19
  Administered 2015-06-18: 20000 [IU] via SUBCUTANEOUS

## 2015-06-18 MED ORDER — EPOETIN ALFA 20000 UNIT/ML IJ SOLN
INTRAMUSCULAR | Status: AC
  Administered 2015-06-18: 20000 [IU] via SUBCUTANEOUS
  Filled 2015-06-18: qty 1

## 2015-06-19 LAB — PTH, INTACT AND CALCIUM
Calcium, Total (PTH): 9.3 mg/dL (ref 8.7–10.3)
PTH: 544 pg/mL — AB (ref 15–65)

## 2015-07-01 ENCOUNTER — Other Ambulatory Visit (HOSPITAL_COMMUNITY): Payer: Self-pay | Admitting: *Deleted

## 2015-07-01 DIAGNOSIS — Q6102 Congenital multiple renal cysts: Secondary | ICD-10-CM | POA: Diagnosis not present

## 2015-07-01 DIAGNOSIS — D495 Neoplasm of unspecified behavior of other genitourinary organs: Secondary | ICD-10-CM | POA: Diagnosis not present

## 2015-07-01 DIAGNOSIS — N281 Cyst of kidney, acquired: Secondary | ICD-10-CM | POA: Diagnosis not present

## 2015-07-02 ENCOUNTER — Ambulatory Visit (HOSPITAL_COMMUNITY)
Admission: RE | Admit: 2015-07-02 | Discharge: 2015-07-02 | Disposition: A | Discharge status: Home / Self Care | Payer: Medicare Other | Source: Ambulatory Visit | Attending: Nephrology | Admitting: Nephrology

## 2015-07-02 DIAGNOSIS — N183 Chronic kidney disease, stage 3 (moderate): Secondary | ICD-10-CM | POA: Insufficient documentation

## 2015-07-02 DIAGNOSIS — D631 Anemia in chronic kidney disease: Secondary | ICD-10-CM | POA: Insufficient documentation

## 2015-07-02 DIAGNOSIS — Z79899 Other long term (current) drug therapy: Secondary | ICD-10-CM | POA: Diagnosis not present

## 2015-07-02 DIAGNOSIS — Z5181 Encounter for therapeutic drug level monitoring: Secondary | ICD-10-CM | POA: Insufficient documentation

## 2015-07-02 LAB — POCT HEMOGLOBIN-HEMACUE: Hemoglobin: 8.6 g/dL — ABNORMAL LOW (ref 12.0–15.0)

## 2015-07-02 MED ORDER — EPOETIN ALFA 20000 UNIT/ML IJ SOLN
INTRAMUSCULAR | Status: AC
Start: 2015-07-02 — End: 2015-07-02
  Filled 2015-07-02: qty 1

## 2015-07-02 MED ORDER — EPOETIN ALFA 20000 UNIT/ML IJ SOLN
20000.0000 [IU] | INTRAMUSCULAR | Status: DC
  Administered 2015-07-02: 20000 [IU] via SUBCUTANEOUS

## 2015-07-06 ENCOUNTER — Ambulatory Visit: Payer: Self-pay | Admitting: Internal Medicine

## 2015-07-06 DIAGNOSIS — Z471 Aftercare following joint replacement surgery: Secondary | ICD-10-CM | POA: Diagnosis not present

## 2015-07-06 DIAGNOSIS — Z96641 Presence of right artificial hip joint: Secondary | ICD-10-CM | POA: Diagnosis not present

## 2015-07-09 ENCOUNTER — Ambulatory Visit (INDEPENDENT_AMBULATORY_CARE_PROVIDER_SITE_OTHER): Payer: Medicare Other | Admitting: Internal Medicine

## 2015-07-09 ENCOUNTER — Encounter: Payer: Self-pay | Admitting: Internal Medicine

## 2015-07-09 VITALS — BP 178/80 | HR 70 | Temp 98.0°F | Resp 16 | Ht 61.5 in | Wt 138.0 lb

## 2015-07-09 DIAGNOSIS — Z79899 Other long term (current) drug therapy: Secondary | ICD-10-CM | POA: Diagnosis not present

## 2015-07-09 DIAGNOSIS — R7309 Other abnormal glucose: Secondary | ICD-10-CM | POA: Diagnosis not present

## 2015-07-09 DIAGNOSIS — N184 Chronic kidney disease, stage 4 (severe): Secondary | ICD-10-CM

## 2015-07-09 DIAGNOSIS — R7303 Prediabetes: Secondary | ICD-10-CM

## 2015-07-09 DIAGNOSIS — E782 Mixed hyperlipidemia: Secondary | ICD-10-CM | POA: Diagnosis not present

## 2015-07-09 DIAGNOSIS — I150 Renovascular hypertension: Secondary | ICD-10-CM

## 2015-07-09 DIAGNOSIS — E559 Vitamin D deficiency, unspecified: Secondary | ICD-10-CM | POA: Diagnosis not present

## 2015-07-09 LAB — HEMOGLOBIN A1C
Hgb A1c MFr Bld: 5.6 % (ref <5.7)
Mean Plasma Glucose: 114 mg/dL (ref <117)

## 2015-07-09 LAB — TSH: TSH: 2.358 u[IU]/mL (ref 0.350–4.500)

## 2015-07-09 LAB — CBC WITH DIFFERENTIAL/PLATELET
BASOS PCT: 0 % (ref 0–1)
Basophils Absolute: 0 10*3/uL (ref 0.0–0.1)
EOS PCT: 3 % (ref 0–5)
Eosinophils Absolute: 0.2 10*3/uL (ref 0.0–0.7)
HCT: 30.4 % — ABNORMAL LOW (ref 36.0–46.0)
Hemoglobin: 9.5 g/dL — ABNORMAL LOW (ref 12.0–15.0)
Lymphocytes Relative: 31 % (ref 12–46)
Lymphs Abs: 1.6 10*3/uL (ref 0.7–4.0)
MCH: 29.1 pg (ref 26.0–34.0)
MCHC: 31.3 g/dL (ref 30.0–36.0)
MCV: 93 fL (ref 78.0–100.0)
MPV: 10.1 fL (ref 8.6–12.4)
Monocytes Absolute: 0.2 10*3/uL (ref 0.1–1.0)
Monocytes Relative: 4 % (ref 3–12)
NEUTROS PCT: 62 % (ref 43–77)
Neutro Abs: 3.1 10*3/uL (ref 1.7–7.7)
PLATELETS: 215 10*3/uL (ref 150–400)
RBC: 3.27 MIL/uL — ABNORMAL LOW (ref 3.87–5.11)
RDW: 16.8 % — AB (ref 11.5–15.5)
WBC: 5 10*3/uL (ref 4.0–10.5)

## 2015-07-09 LAB — HEPATIC FUNCTION PANEL
ALK PHOS: 221 U/L — AB (ref 33–130)
ALT: 8 U/L (ref 6–29)
AST: 12 U/L (ref 10–35)
Albumin: 3.6 g/dL (ref 3.6–5.1)
BILIRUBIN DIRECT: 0.1 mg/dL (ref <=0.2)
BILIRUBIN TOTAL: 0.4 mg/dL (ref 0.2–1.2)
Indirect Bilirubin: 0.3 mg/dL (ref 0.2–1.2)
Total Protein: 6.2 g/dL (ref 6.1–8.1)

## 2015-07-09 LAB — BASIC METABOLIC PANEL WITH GFR
BUN: 72 mg/dL — AB (ref 7–25)
CO2: 15 mmol/L — ABNORMAL LOW (ref 20–31)
Calcium: 9 mg/dL (ref 8.6–10.4)
Chloride: 112 mmol/L — ABNORMAL HIGH (ref 98–110)
Creat: 4.63 mg/dL — ABNORMAL HIGH (ref 0.60–0.93)
GFR, Est African American: 10 mL/min — ABNORMAL LOW (ref >=60)
GFR, Est Non African American: 9 mL/min — ABNORMAL LOW (ref >=60)
Glucose, Bld: 102 mg/dL — ABNORMAL HIGH (ref 65–99)
POTASSIUM: 4.9 mmol/L (ref 3.5–5.3)
SODIUM: 141 mmol/L (ref 135–146)

## 2015-07-09 LAB — MAGNESIUM: Magnesium: 1.7 mg/dL (ref 1.5–2.5)

## 2015-07-09 LAB — LIPID PANEL
Cholesterol: 219 mg/dL — ABNORMAL HIGH (ref 125–200)
HDL: 85 mg/dL (ref >=46)
LDL CALC: 116 mg/dL (ref <130)
TRIGLYCERIDES: 90 mg/dL (ref <150)
Total CHOL/HDL Ratio: 2.6 Ratio (ref <=5.0)
VLDL: 18 mg/dL (ref <30)

## 2015-07-09 MED ORDER — CLONIDINE HCL 0.1 MG PO TABS: 0.1000 mg | ORAL_TABLET | Freq: Two times a day (BID) | ORAL | Status: DC

## 2015-07-09 NOTE — Patient Instructions (Signed)
Clonidine tablets What is this medicine? CLONIDINE (KLOE ni deen) is used to treat high blood pressure. This medicine may be used for other purposes; ask your health care provider or pharmacist if you have questions. COMMON BRAND NAME(S): Catapres What should I tell my health care provider before I take this medicine? They need to know if you have any of these conditions: -kidney disease -an unusual or allergic reaction to clonidine, other medicines, foods, dyes, or preservatives -pregnant or trying to get pregnant -breast-feeding How should I use this medicine? Take this medicine by mouth with a glass of water. Follow the directions on the prescription label. Take your doses at regular intervals. Do not take your medicine more often than directed. Do not suddenly stop taking this medicine. You must gradually reduce the dose or you may get a dangerous increase in blood pressure. Ask your doctor or health care professional for advice. Talk to your pediatrician regarding the use of this medicine in children. Special care may be needed. Overdosage: If you think you have taken too much of this medicine contact a poison control center or emergency room at once. NOTE: This medicine is only for you. Do not share this medicine with others. What if I miss a dose? If you miss a dose, take it as soon as you can. If it is almost time for your next dose, take only that dose. Do not take double or extra doses. What may interact with this medicine? Do not take this medicine with any of the following medications: -MAOIs like Carbex, Eldepryl, Marplan, Nardil, and Parnate This medicine may also interact with the following medications: -barbiturate medicines for inducing sleep or treating seizures like phenobarbital -certain medicines for blood pressure, heart disease, irregular heart beat -certain medicines for depression, anxiety, or psychotic disturbances -prescription pain medicines This list may not  describe all possible interactions. Give your health care provider a list of all the medicines, herbs, non-prescription drugs, or dietary supplements you use. Also tell them if you smoke, drink alcohol, or use illegal drugs. Some items may interact with your medicine. What should I watch for while using this medicine? Visit your doctor or health care professional for regular checks on your progress. Check your heart rate and blood pressure regularly while you are taking this medicine. Ask your doctor or health care professional what your heart rate should be and when you should contact him or her. You may get drowsy or dizzy. Do not drive, use machinery, or do anything that needs mental alertness until you know how this medicine affects you. To avoid dizzy or fainting spells, do not stand or sit up quickly, especially if you are an older person. Alcohol can make you more drowsy and dizzy. Avoid alcoholic drinks. Your mouth may get dry. Chewing sugarless gum or sucking hard candy, and drinking plenty of water will help. Do not treat yourself for coughs, colds, or pain while you are taking this medicine without asking your doctor or health care professional for advice. Some ingredients may increase your blood pressure. If you are going to have surgery tell your doctor or health care professional that you are taking this medicine. What side effects may I notice from receiving this medicine? Side effects that you should report to your doctor or health care professional as soon as possible: -allergic reactions like skin rash, itching or hives, swelling of the face, lips, or tongue -anxiety, nervousness -chest pain -depression -fast, irregular heartbeat -swelling of feet or legs -unusually   weak or tired Side effects that usually do not require medical attention (report to your doctor or health care professional if they continue or are bothersome): -change in sex drive or  performance -constipation -headache This list may not describe all possible side effects. Call your doctor for medical advice about side effects. You may report side effects to FDA at 1-800-FDA-1088. Where should I keep my medicine? Keep out of the reach of children. Store at room temperature between 15 and 30 degrees C (59 and 86 degrees F). Protect from light. Keep container tightly closed. Throw away any unused medicine after the expiration date. NOTE: This sheet is a summary. It may not cover all possible information. If you have questions about this medicine, talk to your doctor, pharmacist, or health care provider.  2015, [employer]vier/Gold Standard. (2011-05-03 13:01:28)

## 2015-07-09 NOTE — Progress Notes (Signed)
Patient ID: Eileen Perez, female   DOB: 07/28/1941, 74 y.o.   MRN: [id number]  Assessment and Plan:  Hypertension:  -didn't want to increase amlodipine as it worsens leg swelling -will try clonidine, she is due to see her nephrologist in 2 weeks.  Will call if she likes new meds and needs refill -Continue medication,  -monitor blood pressure at home.  -Continue DASH diet.   -Reminder to go to the ER if any CP, SOB, nausea, dizziness, severe HA, changes vision/speech, left arm numbness and tingling, and jaw pain.  Cholesterol: -Continue diet and exercise.  -Check cholesterol.   Pre-diabetes: -Continue diet and exercise.  -Check A1C  Vitamin D Def: -check level -continue medications.   Continue diet and meds as discussed. Further disposition pending results of labs.  HPI 74 y.o. female  presents for 3 month follow up with hypertension, hyperlipidemia, prediabetes and vitamin D.   Her blood pressure has not been controlled at home, today their BP is BP: (!) 178/80 mmHg.   She does not workout. She denies chest pain, shortness of breath, dizziness.   She is not on cholesterol medication and denies myalgias. Her cholesterol is not at goal. The cholesterol last visit was:   Lab Results  Component Value Date   CHOL 218* 03/26/2015   HDL 81 03/26/2015   LDLCALC 121* 03/26/2015   TRIG 78 03/26/2015   CHOLHDL 2.7 03/26/2015     She has been working on diet and exercise for prediabetes, and denies foot ulcerations, hyperglycemia, hypoglycemia , increased appetite, nausea, paresthesia of the feet, polydipsia, polyuria, visual disturbances, vomiting and weight loss. Last A1C in the office was:  Lab Results  Component Value Date   HGBA1C 5.8* 03/26/2015    Patient is on Vitamin D supplement.  Lab Results  Component Value Date   VD25OH 37 03/26/2015      Current Medications:  Current Outpatient Prescriptions on File Prior to Visit  Medication Sig Dispense Refill  . ACCU-CHEK  AVIVA PLUS test strip Test once daily 100 each 6  . allopurinol (ZYLOPRIM) 300 MG tablet Take 1/2 to 1 tab daily as directed. (Patient taking differently: Take 150 mg by mouth 3 (three) times a week. MWF) 90 tablet 1  . amLODipine (NORVASC) 5 MG tablet Take 2.5 mg by mouth at bedtime.     . docusate sodium (COLACE) 100 MG capsule Take 100 mg by mouth 2 (two) times daily.    . Ferrous Sulfate (SLOW FE PO) Take 45 mg by mouth 2 (two) times daily.    . furosemide (LASIX) 40 MG tablet Take 40 mg by mouth daily.    . hydrALAZINE (APRESOLINE) 10 MG tablet Take 2 tablets (20 mg total) by mouth 2 (two) times daily. 120 tablet 6  . labetalol (NORMODYNE) 200 MG tablet Take 1 tablet (200 mg total) by mouth 2 (two) times daily. 180 tablet 3  . zinc gluconate 50 MG tablet Take 50 mg by mouth every morning.    . [DISCONTINUED] diltiazem (CARDIZEM) 120 MG tablet Take 120 mg by mouth 2 (two) times daily.     No current facility-administered medications on file prior to visit.    Medical History:  Past Medical History  Diagnosis Date  . Diabetes mellitus     Pre  . Hypertension   . Arthritis     Osteoarthritis  . Anemia   . History of blood transfusion   . Chronic kidney disease     Chronic renal insufficiency  .  Pancreatic cyst 1999  . Thyroid disease     Hyperparathyroid   . GERD (gastroesophageal reflux disease)   . Gout   . Osteopenia   . Diverticulosis   . Hiatal hernia   . Hyperlipidemia   . Heart murmur     hx of in childhood    Allergies:  Allergies  Allergen Reactions  . Ace Inhibitors     unknown  . Nsaids     unknown     Review of Systems:  Review of Systems  Constitutional: Negative for fever, chills and malaise/fatigue.  HENT: Negative for congestion, ear pain and sore throat.   Eyes: Negative.   Respiratory: Negative for cough, shortness of breath and wheezing.   Cardiovascular: Negative for chest pain, palpitations and leg swelling.  Gastrointestinal: Negative for  heartburn, diarrhea, constipation, blood in stool and melena.  Genitourinary: Negative.   Skin: Negative.   Neurological: Negative for dizziness, sensory change, loss of consciousness and headaches.  Psychiatric/Behavioral: Negative for depression. The patient is not nervous/anxious and does not have insomnia.     Family history- Review and unchanged  Social history- Review and unchanged  Physical Exam: BP 178/80 mmHg  Pulse 70  Temp(Src) 98 F (36.7 C) (Temporal)  Resp 16  Ht 5' 1.5" (1.562 m)  Wt 138 lb (62.596 kg)  BMI 25.66 kg/m2 Wt Readings from Last 3 Encounters:  07/09/15 138 lb (62.596 kg)  05/26/15 134 lb (60.782 kg)  05/19/15 134 lb 2 oz (60.839 kg)    General Appearance: Well nourished well developed, in no apparent distress. Eyes: PERRLA, EOMs, conjunctiva no swelling or erythema ENT/Mouth: Ear canals normal without obstruction, swelling, erythma, discharge.  TMs normal bilaterally.  Oropharynx moist, clear, without exudate, or postoropharyngeal swelling. Neck: Supple, thyroid normal,no cervical adenopathy  Respiratory: Respiratory effort normal, Breath sounds clear A&P without rhonchi, wheeze, or rale.  No retractions, no accessory usage. Cardio: RRR with no MRGs. Brisk peripheral pulses without edema.  Abdomen: Soft, + BS,  Non tender, no guarding, rebound, hernias, masses. Musculoskeletal: Full ROM, 5/5 strength, Normal gait Skin: Warm, dry without rashes, lesions, ecchymosis.  Neuro: Awake and oriented X 3, Cranial nerves intact. Normal muscle tone, no cerebellar symptoms. Psych: Normal affect, Insight and Judgment appropriate.    Starlyn Skeans, PA-C 9:53 AM [facility]

## 2015-07-10 LAB — INSULIN, RANDOM: Insulin: 3.8 u[IU]/mL (ref 2.0–19.6)

## 2015-07-10 LAB — VITAMIN D 25 HYDROXY (VIT D DEFICIENCY, FRACTURES): Vit D, 25-Hydroxy: 23 ng/mL — ABNORMAL LOW (ref 30–100)

## 2015-07-16 ENCOUNTER — Encounter (HOSPITAL_COMMUNITY)
Admission: RE | Admit: 2015-07-16 | Discharge: 2015-07-16 | Disposition: A | Discharge status: Home / Self Care | Payer: Medicare Other | Source: Ambulatory Visit | Attending: Nephrology | Admitting: Nephrology

## 2015-07-16 DIAGNOSIS — N183 Chronic kidney disease, stage 3 (moderate): Secondary | ICD-10-CM | POA: Diagnosis not present

## 2015-07-16 DIAGNOSIS — D631 Anemia in chronic kidney disease: Secondary | ICD-10-CM | POA: Insufficient documentation

## 2015-07-16 LAB — RENAL FUNCTION PANEL
Albumin: 3.1 g/dL — ABNORMAL LOW (ref 3.5–5.0)
Anion gap: 10 (ref 5–15)
BUN: 70 mg/dL — ABNORMAL HIGH (ref 6–20)
CO2: 16 mmol/L — AB (ref 22–32)
CREATININE: 4.56 mg/dL — AB (ref 0.44–1.00)
Calcium: 9.1 mg/dL (ref 8.9–10.3)
Chloride: 116 mmol/L — ABNORMAL HIGH (ref 101–111)
GFR calc non Af Amer: 9 mL/min — ABNORMAL LOW (ref >60)
GFR, EST AFRICAN AMERICAN: 10 mL/min — AB (ref >60)
GLUCOSE: 119 mg/dL — AB (ref 65–99)
Phosphorus: 5.7 mg/dL — ABNORMAL HIGH (ref 2.5–4.6)
Potassium: 4.7 mmol/L (ref 3.5–5.1)
Sodium: 142 mmol/L (ref 135–145)

## 2015-07-16 LAB — POCT HEMOGLOBIN-HEMACUE: Hemoglobin: 9.4 g/dL — ABNORMAL LOW (ref 12.0–15.0)

## 2015-07-16 LAB — IRON AND TIBC
Iron: 42 ug/dL (ref 28–170)
SATURATION RATIOS: 19 % (ref 10.4–31.8)
TIBC: 216 ug/dL — AB (ref 250–450)
UIBC: 174 ug/dL

## 2015-07-16 LAB — FERRITIN: FERRITIN: 345 ng/mL — AB (ref 11–307)

## 2015-07-16 MED ORDER — EPOETIN ALFA 20000 UNIT/ML IJ SOLN
20000.0000 [IU] | INTRAMUSCULAR | Status: DC
  Administered 2015-07-16: 20000 [IU] via SUBCUTANEOUS

## 2015-07-16 MED ORDER — EPOETIN ALFA 20000 UNIT/ML IJ SOLN
INTRAMUSCULAR | Status: AC
Start: 2015-07-16 — End: 2015-07-16
  Administered 2015-07-16: 20000 [IU] via SUBCUTANEOUS
  Filled 2015-07-16: qty 1

## 2015-07-17 LAB — PTH, INTACT AND CALCIUM
Calcium, Total (PTH): 8.9 mg/dL (ref 8.7–10.3)
PTH: 612 pg/mL — ABNORMAL HIGH (ref 15–65)

## 2015-07-23 DIAGNOSIS — D638 Anemia in other chronic diseases classified elsewhere: Secondary | ICD-10-CM | POA: Diagnosis not present

## 2015-07-23 DIAGNOSIS — I129 Hypertensive chronic kidney disease with stage 1 through stage 4 chronic kidney disease, or unspecified chronic kidney disease: Secondary | ICD-10-CM | POA: Diagnosis not present

## 2015-07-23 DIAGNOSIS — E785 Hyperlipidemia, unspecified: Secondary | ICD-10-CM | POA: Diagnosis not present

## 2015-07-23 DIAGNOSIS — N184 Chronic kidney disease, stage 4 (severe): Secondary | ICD-10-CM | POA: Diagnosis not present

## 2015-07-30 ENCOUNTER — Encounter (HOSPITAL_COMMUNITY)
Admission: RE | Admit: 2015-07-30 | Discharge: 2015-07-30 | Disposition: A | Discharge status: Home / Self Care | Payer: Medicare Other | Source: Ambulatory Visit | Attending: Nephrology | Admitting: Nephrology

## 2015-07-30 ENCOUNTER — Other Ambulatory Visit: Payer: Self-pay | Admitting: *Deleted

## 2015-07-30 DIAGNOSIS — N183 Chronic kidney disease, stage 3 (moderate): Secondary | ICD-10-CM | POA: Insufficient documentation

## 2015-07-30 DIAGNOSIS — D631 Anemia in chronic kidney disease: Secondary | ICD-10-CM | POA: Insufficient documentation

## 2015-07-30 LAB — POCT HEMOGLOBIN-HEMACUE: Hemoglobin: 10.1 g/dL — ABNORMAL LOW (ref 12.0–15.0)

## 2015-07-30 MED ORDER — EPOETIN ALFA 20000 UNIT/ML IJ SOLN
INTRAMUSCULAR | Status: AC
  Filled 2015-07-30: qty 1

## 2015-07-30 MED ORDER — EPOETIN ALFA 20000 UNIT/ML IJ SOLN
20000.0000 [IU] | INTRAMUSCULAR | Status: DC
  Administered 2015-07-30: 20000 [IU] via SUBCUTANEOUS

## 2015-07-30 MED ORDER — CLONIDINE HCL 0.1 MG PO TABS: 0.1000 mg | ORAL_TABLET | Freq: Two times a day (BID) | ORAL | Status: DC

## 2015-08-01 ENCOUNTER — Other Ambulatory Visit: Payer: Self-pay | Admitting: Internal Medicine

## 2015-08-13 ENCOUNTER — Encounter (HOSPITAL_COMMUNITY)
Admission: RE | Admit: 2015-08-13 | Discharge: 2015-08-13 | Disposition: A | Discharge status: Home / Self Care | Payer: Medicare Other | Source: Ambulatory Visit | Attending: Nephrology | Admitting: Nephrology

## 2015-08-13 DIAGNOSIS — D631 Anemia in chronic kidney disease: Secondary | ICD-10-CM | POA: Diagnosis not present

## 2015-08-13 DIAGNOSIS — N183 Chronic kidney disease, stage 3 (moderate): Secondary | ICD-10-CM | POA: Diagnosis not present

## 2015-08-13 LAB — RENAL FUNCTION PANEL
Albumin: 3.2 g/dL — ABNORMAL LOW (ref 3.5–5.0)
Anion gap: 8 (ref 5–15)
BUN: 78 mg/dL — AB (ref 6–20)
CALCIUM: 9.6 mg/dL (ref 8.9–10.3)
CHLORIDE: 113 mmol/L — AB (ref 101–111)
CO2: 20 mmol/L — AB (ref 22–32)
CREATININE: 5.66 mg/dL — AB (ref 0.44–1.00)
GFR calc non Af Amer: 7 mL/min — ABNORMAL LOW (ref >60)
GFR, EST AFRICAN AMERICAN: 8 mL/min — AB (ref >60)
Glucose, Bld: 112 mg/dL — ABNORMAL HIGH (ref 65–99)
Phosphorus: 5.9 mg/dL — ABNORMAL HIGH (ref 2.5–4.6)
Potassium: 5.5 mmol/L — ABNORMAL HIGH (ref 3.5–5.1)
SODIUM: 141 mmol/L (ref 135–145)

## 2015-08-13 LAB — IRON AND TIBC
IRON: 56 ug/dL (ref 28–170)
Saturation Ratios: 25 % (ref 10.4–31.8)
TIBC: 225 ug/dL — AB (ref 250–450)
UIBC: 169 ug/dL

## 2015-08-13 LAB — FERRITIN: FERRITIN: 299 ng/mL (ref 11–307)

## 2015-08-13 LAB — POCT HEMOGLOBIN-HEMACUE: Hemoglobin: 11.2 g/dL — ABNORMAL LOW (ref 12.0–15.0)

## 2015-08-13 MED ORDER — EPOETIN ALFA 20000 UNIT/ML IJ SOLN
20000.0000 [IU] | INTRAMUSCULAR | Status: DC
  Administered 2015-08-13: 20000 [IU] via SUBCUTANEOUS

## 2015-08-13 MED ORDER — EPOETIN ALFA 20000 UNIT/ML IJ SOLN
INTRAMUSCULAR | Status: AC
  Filled 2015-08-13: qty 1

## 2015-08-14 LAB — PTH, INTACT AND CALCIUM
Calcium, Total (PTH): 9.4 mg/dL (ref 8.7–10.3)
PTH: 744 pg/mL — ABNORMAL HIGH (ref 15–65)

## 2015-08-16 ENCOUNTER — Encounter: Payer: Self-pay | Admitting: Emergency Medicine

## 2015-08-27 ENCOUNTER — Encounter (HOSPITAL_COMMUNITY): Payer: Self-pay

## 2015-08-28 ENCOUNTER — Encounter: Payer: Self-pay | Admitting: *Deleted

## 2015-08-28 ENCOUNTER — Other Ambulatory Visit: Payer: Self-pay | Admitting: Internal Medicine

## 2015-09-02 ENCOUNTER — Other Ambulatory Visit (HOSPITAL_COMMUNITY): Payer: Self-pay | Admitting: *Deleted

## 2015-09-03 ENCOUNTER — Encounter (HOSPITAL_COMMUNITY)
Admission: RE | Admit: 2015-09-03 | Discharge: 2015-09-03 | Disposition: A | Discharge status: Home / Self Care | Payer: Medicare Other | Source: Ambulatory Visit | Attending: Nephrology | Admitting: Nephrology

## 2015-09-03 DIAGNOSIS — N183 Chronic kidney disease, stage 3 (moderate): Secondary | ICD-10-CM | POA: Insufficient documentation

## 2015-09-03 DIAGNOSIS — D631 Anemia in chronic kidney disease: Secondary | ICD-10-CM | POA: Insufficient documentation

## 2015-09-03 LAB — RENAL FUNCTION PANEL
ALBUMIN: 3.4 g/dL — AB (ref 3.5–5.0)
ANION GAP: 9 (ref 5–15)
BUN: 89 mg/dL — ABNORMAL HIGH (ref 6–20)
CALCIUM: 9.8 mg/dL (ref 8.9–10.3)
CO2: 17 mmol/L — ABNORMAL LOW (ref 22–32)
Chloride: 117 mmol/L — ABNORMAL HIGH (ref 101–111)
Creatinine, Ser: 5.23 mg/dL — ABNORMAL HIGH (ref 0.44–1.00)
GFR calc Af Amer: 8 mL/min — ABNORMAL LOW (ref >60)
GFR, EST NON AFRICAN AMERICAN: 7 mL/min — AB (ref >60)
GLUCOSE: 112 mg/dL — AB (ref 65–99)
PHOSPHORUS: 6.1 mg/dL — AB (ref 2.5–4.6)
Potassium: 5.3 mmol/L — ABNORMAL HIGH (ref 3.5–5.1)
SODIUM: 143 mmol/L (ref 135–145)

## 2015-09-03 LAB — FERRITIN: Ferritin: 316 ng/mL — ABNORMAL HIGH (ref 11–307)

## 2015-09-03 LAB — IRON AND TIBC
Iron: 103 ug/dL (ref 28–170)
SATURATION RATIOS: 42 % — AB (ref 10.4–31.8)
TIBC: 245 ug/dL — ABNORMAL LOW (ref 250–450)
UIBC: 142 ug/dL

## 2015-09-03 LAB — POCT HEMOGLOBIN-HEMACUE: Hemoglobin: 11.4 g/dL — ABNORMAL LOW (ref 12.0–15.0)

## 2015-09-03 MED ORDER — EPOETIN ALFA 20000 UNIT/ML IJ SOLN
INTRAMUSCULAR | Status: AC
  Filled 2015-09-03: qty 1

## 2015-09-03 MED ORDER — EPOETIN ALFA 20000 UNIT/ML IJ SOLN
20000.0000 [IU] | INTRAMUSCULAR | Status: DC
  Administered 2015-09-03: 20000 [IU] via SUBCUTANEOUS

## 2015-09-04 LAB — PTH, INTACT AND CALCIUM
Calcium, Total (PTH): 9.5 mg/dL (ref 8.7–10.3)
PTH: 664 pg/mL — ABNORMAL HIGH (ref 15–65)

## 2015-09-10 ENCOUNTER — Emergency Department (HOSPITAL_COMMUNITY)
Admission: EM | Admit: 2015-09-10 | Discharge: 2015-09-10 | Disposition: A | Discharge status: Home / Self Care | Payer: Medicare Other | Attending: Emergency Medicine | Admitting: Emergency Medicine

## 2015-09-10 ENCOUNTER — Emergency Department (HOSPITAL_COMMUNITY): Payer: Medicare Other

## 2015-09-10 ENCOUNTER — Emergency Department (HOSPITAL_COMMUNITY)
Admission: EM | Admit: 2015-09-10 | Discharge: 2015-09-10 | Disposition: A | Discharge status: Other Acute Inpatient Hospital | Payer: Medicare Other | Source: Home / Self Care | Attending: Family Medicine | Admitting: Family Medicine

## 2015-09-10 ENCOUNTER — Encounter (HOSPITAL_COMMUNITY): Payer: Self-pay | Admitting: *Deleted

## 2015-09-10 ENCOUNTER — Encounter (HOSPITAL_COMMUNITY): Payer: Self-pay | Admitting: Emergency Medicine

## 2015-09-10 DIAGNOSIS — M542 Cervicalgia: Secondary | ICD-10-CM | POA: Diagnosis not present

## 2015-09-10 DIAGNOSIS — Z8719 Personal history of other diseases of the digestive system: Secondary | ICD-10-CM | POA: Insufficient documentation

## 2015-09-10 DIAGNOSIS — G4489 Other headache syndrome: Secondary | ICD-10-CM | POA: Diagnosis not present

## 2015-09-10 DIAGNOSIS — I129 Hypertensive chronic kidney disease with stage 1 through stage 4 chronic kidney disease, or unspecified chronic kidney disease: Secondary | ICD-10-CM | POA: Insufficient documentation

## 2015-09-10 DIAGNOSIS — D649 Anemia, unspecified: Secondary | ICD-10-CM | POA: Insufficient documentation

## 2015-09-10 DIAGNOSIS — M47812 Spondylosis without myelopathy or radiculopathy, cervical region: Secondary | ICD-10-CM

## 2015-09-10 DIAGNOSIS — M436 Torticollis: Secondary | ICD-10-CM | POA: Diagnosis not present

## 2015-09-10 DIAGNOSIS — M109 Gout, unspecified: Secondary | ICD-10-CM | POA: Insufficient documentation

## 2015-09-10 DIAGNOSIS — M1990 Unspecified osteoarthritis, unspecified site: Secondary | ICD-10-CM | POA: Insufficient documentation

## 2015-09-10 DIAGNOSIS — Z79899 Other long term (current) drug therapy: Secondary | ICD-10-CM | POA: Diagnosis not present

## 2015-09-10 DIAGNOSIS — E119 Type 2 diabetes mellitus without complications: Secondary | ICD-10-CM | POA: Diagnosis not present

## 2015-09-10 DIAGNOSIS — N189 Chronic kidney disease, unspecified: Secondary | ICD-10-CM | POA: Insufficient documentation

## 2015-09-10 DIAGNOSIS — R011 Cardiac murmur, unspecified: Secondary | ICD-10-CM | POA: Diagnosis not present

## 2015-09-10 DIAGNOSIS — R51 Headache: Secondary | ICD-10-CM | POA: Diagnosis not present

## 2015-09-10 DIAGNOSIS — R519 Headache, unspecified: Secondary | ICD-10-CM

## 2015-09-10 MED ORDER — PREDNISONE 20 MG PO TABS: 20.0000 mg | ORAL_TABLET | Freq: Two times a day (BID) | ORAL | Status: DC

## 2015-09-10 MED ORDER — OXYCODONE-ACETAMINOPHEN 5-325 MG PO TABS
1.0000 | ORAL_TABLET | Freq: Once | ORAL | Status: AC
  Administered 2015-09-10: 1 via ORAL
  Filled 2015-09-10: qty 1

## 2015-09-10 MED ORDER — HYDROCODONE-ACETAMINOPHEN 5-325 MG PO TABS: 1.0000 | ORAL_TABLET | Freq: Four times a day (QID) | ORAL | Status: DC | PRN

## 2015-09-10 NOTE — ED Provider Notes (Addendum)
CSN: [id number]     Arrival date & time 09/10/15  1543 History   First MD Initiated Contact with Patient 09/10/15 1606     Chief Complaint  Patient presents with  . Neck Pain  . Headache     (Consider location/radiation/quality/duration/timing/severity/associated sxs/prior Treatment) The history is provided by the patient.   Eileen Perez is a 73 y.o. female who presents for eval. Of HA for several days. No trauma. Worse with neck movement. Using Tramadol without relief. Seen earlier today at the Urgent Care and sent here for further evaluation. No fever, N/V, or chest pain. There are no other known modifying factors.  Past Medical History  Diagnosis Date  . Diabetes mellitus     Pre  . Hypertension   . Arthritis     Osteoarthritis  . Anemia   . History of blood transfusion   . Chronic kidney disease     Chronic renal insufficiency  . Pancreatic cyst 1999  . Thyroid disease     Hyperparathyroid   . GERD (gastroesophageal reflux disease)   . Gout   . Osteopenia   . Diverticulosis   . Hiatal hernia   . Hyperlipidemia   . Heart murmur     hx of in childhood   Past Surgical History  Procedure Laterality Date  . Pancreatic cyst excision  1999  . Joint replacement  2012    left knee  . Eye surgery Left     cataract extraction with IOL  . Cholecystectomy    . 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 Colonoscopy  03/21/12    Next one in 2018  . Left knee replacement     . 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 Hypertension Mother   . Cancer Mother     Colon with METS  . Diabetes Mother   . Cancer Father     Prostate and Throat    Social History  Substance Use Topics  . Smoking status: Never Smoker   . Smokeless tobacco: Never Used  . Alcohol Use: No   OB History    No data available     Review of Systems  All other systems reviewed and are negative.     Allergies  Ace inhibitors and Nsaids  Home Medications   Prior to Admission medications   Medication Sig Start Date End Date Taking? Authorizing Provider  allopurinol (ZYLOPRIM) 300 MG tablet Take 1/2 to 1 tablet by  mouth daily as directed 08/28/15  Yes [name], PA-C  amLODipine (NORVASC) 5 MG tablet Take 2.5 mg by mouth at bedtime.  03/22/15  Yes Historical Provider, MD  CALCIUM PO Take by mouth.   Yes Historical Provider, MD  Cholecalciferol (VITAMIN D-3 PO) Take 1 tablet by mouth daily.   Yes Historical Provider, MD  cloNIDine (CATAPRES) 0.1 MG tablet Take 1 tablet (0.1 mg total) by mouth 2 (two) times daily. 07/30/15 07/29/16 Yes Unk Pinto, MD  docusate sodium (COLACE) 100 MG capsule Take 100 mg by mouth 2 (two) times daily.   Yes Historical Provider, MD  Ferrous Sulfate (SLOW FE PO) Take 45 mg by mouth 2 (two) times  daily.   Yes Historical Provider, MD  Flaxseed, Linseed, (FLAXSEED OIL PO) Take 1 tablet by mouth daily.   Yes Historical Provider, MD  furosemide (LASIX) 40 MG tablet Take 40 mg by mouth daily.   Yes Historical Provider, MD  hydrALAZINE (APRESOLINE) 10 MG tablet Take 2 tablets (20 mg total) by mouth 2 (two) times daily. 04/23/15  Yes Minus Breeding, MD  labetalol (NORMODYNE) 200 MG tablet Take 1 tablet by mouth two  times daily 08/01/15  Yes Unk Pinto, MD  Multiple Vitamin (MULTIVITAMIN WITH MINERALS) TABS tablet Take 1 tablet by mouth daily.   Yes Historical Provider, MD  omega-3 acid ethyl esters (LOVAZA) 1 G capsule Take 1 g by mouth 2 (two) times daily.   Yes Historical Provider, MD  Red Yeast Rice Extract (RED YEAST RICE PO) Take 1 tablet by mouth daily.   Yes Historical Provider, MD  zinc gluconate 50 MG tablet Take 50  mg by mouth every morning.   Yes Historical Provider, MD  ACCU-CHEK AVIVA PLUS test strip Test once daily    Unk Pinto, MD  HYDROcodone-acetaminophen (NORCO) 5-325 MG tablet Take 1 tablet by mouth every 6 (six) hours as needed for moderate pain. 09/10/15   Daleen Bo, MD  predniSONE (DELTASONE) 20 MG tablet Take 1 tablet (20 mg total) by mouth 2 (two) times daily. 09/10/15   Daleen Bo, MD   BP 165/77 mmHg  Pulse 70  Temp(Src) 97.9 F (36.6 C) (Oral)  Resp 16  Ht 5\' 1"  (1.549 m)  Wt 131 lb (59.421 kg)  BMI 24.76 kg/m2  SpO2 99% Physical Exam  Constitutional: She is oriented to person, place, and time. She appears well-developed.  Elderly, robust  HENT:  Head: Normocephalic and atraumatic.  Right Ear: External ear normal.  Left Ear: External ear normal.  Eyes: Conjunctivae and EOM are normal. Pupils are equal, round, and reactive to light.  Neck: Normal range of motion and phonation normal. Neck supple.  No meningismus  Cardiovascular: Normal rate, regular rhythm and normal heart sounds.   Pulmonary/Chest: Effort normal and breath sounds normal. She exhibits no bony tenderness.  Abdominal: Soft. There is no tenderness.  Musculoskeletal: Normal range of motion.  Tender left lower lateral neck. Increased pain with left lateral bending.  Neurological: She is alert and oriented to person, place, and time. No cranial nerve deficit or sensory deficit. She exhibits normal muscle tone. Coordination normal.  Skin: Skin is warm, dry and intact.  Psychiatric: She has a normal mood and affect. Her behavior is normal. Judgment and thought content normal.  Nursing note and vitals reviewed.   ED Course  Procedures (including critical care time)  Medications  oxyCODONE-acetaminophen (PERCOCET/ROXICET) 5-325 MG per tablet 1 tablet (1 tablet Oral Given 09/10/15 1635)    Patient Vitals for the past 24 hrs:  BP Temp Temp src Pulse Resp SpO2 Height Weight  09/10/15 1825 165/77 mmHg  - - 70 16 99 % - -  09/10/15 1800 144/68 mmHg - - 73 - 98 % - -  09/10/15 1743 169/74 mmHg - - 78 16 99 % - -  09/10/15 1700 186/86 mmHg - - 81 16 100 % - -  09/10/15 1630 (!) 209/81 mmHg - - 80 16 100 % - -  09/10/15 1617 194/79 mmHg - - 78 16 100 % - -  09/10/15 1553 - - - - - - 5\' 1"  (1.549 m) 131 lb (59.421 kg)  09/10/15 1549 190/87 mmHg 97.9 F (36.6  C) Oral 81 16 98 % 5\' 1"  (1.549 m) 131 lb 11.2 oz (59.739 kg)    6:32 PM Reevaluation with update and discussion. After initial assessment and treatment, an updated evaluation reveals no further complaints. She feels somewhat better. Findings discussed with the patient, all questions were answered. [place] Review Labs Reviewed - No data to display  Imaging Review Ct Head Wo Contrast  09/10/2015  CLINICAL DATA:  Headache posteriorly for 3 days. Neck stiffness. No known injury. Initial encounter. EXAM: CT HEAD WITHOUT CONTRAST CT CERVICAL SPINE WITHOUT CONTRAST TECHNIQUE: Multidetector CT imaging of the head and cervical spine was performed following the standard protocol without intravenous contrast. Multiplanar CT image reconstructions of the cervical spine were also generated. COMPARISON:  None. FINDINGS: CT HEAD FINDINGS There is no evidence of acute intracranial abnormality including hemorrhage, infarct, mass lesion, mass effect, midline shift or abnormal extra-axial fluid collection. No hydrocephalus or pneumocephalus. Small mucous retention cyst or polyp left maxillary sinus is noted. The calvarium is intact. CT CERVICAL SPINE FINDINGS Facet degenerative change results in 0.2 cm anterolisthesis C4 on C5. Loss of disc space height and endplate spurring are worst at C5-6 and C7-T1. Scattered facet arthropathy is worst on the left at C4-5. There is no fracture. The lung apices are clear. IMPRESSION: Mucous retention cyst or polyp left maxillary sinus. The patient's head CT scan is otherwise negative. No acute abnormality  cervical spine with spondylosis worst at C4-5, C5-6 and C7-T1. Electronically Signed   By: Inge Rise M.D.   On: 09/10/2015 17:47   Ct Cervical Spine Wo Contrast  09/10/2015  CLINICAL DATA:  Headache posteriorly for 3 days. Neck stiffness. No known injury. Initial encounter. EXAM: CT HEAD WITHOUT CONTRAST CT CERVICAL SPINE WITHOUT CONTRAST TECHNIQUE: Multidetector CT imaging of the head and cervical spine was performed following the standard protocol without intravenous contrast. Multiplanar CT image reconstructions of the cervical spine were also generated. COMPARISON:  None. FINDINGS: CT HEAD FINDINGS There is no evidence of acute intracranial abnormality including hemorrhage, infarct, mass lesion, mass effect, midline shift or abnormal extra-axial fluid collection. No hydrocephalus or pneumocephalus. Small mucous retention cyst or polyp left maxillary sinus is noted. The calvarium is intact. CT CERVICAL SPINE FINDINGS Facet degenerative change results in 0.2 cm anterolisthesis C4 on C5. Loss of disc space height and endplate spurring are worst at C5-6 and C7-T1. Scattered facet arthropathy is worst on the left at C4-5. There is no fracture. The lung apices are clear. IMPRESSION: Mucous retention cyst or polyp left maxillary sinus. The patient's head CT scan is otherwise negative. No acute abnormality cervical spine with spondylosis worst at C4-5, C5-6 and C7-T1. Electronically Signed   By: Inge Rise M.D.   On: 09/10/2015 17:47   I have personally reviewed and evaluated these images and lab results as part of my medical decision-making.   EKG Interpretation None      MDM   Final diagnoses:  Degenerative joint disease of cervical spine  Neck pain  Other headache syndrome   \  Headache and neck pain likely secondary to degenerative changes in the lower cervical spine. Doubt spinal myelopathy, intracranial bleeding, serious bacterial infection or metabolic instability.  Nursing  Notes Reviewed/ Care Coordinated Applicable Imaging Reviewed Interpretation of Laboratory Data incorporated into ED treatment  The patient appears reasonably screened and/or stabilized for discharge and I doubt any other medical condition or other [facility] requiring further screening, evaluation, or treatment in the ED  at this time prior to discharge.  Plan: Home Medications- Norco, Prednisone; Home Treatments- Heat; return here if the recommended treatment, does not improve the symptoms; Recommended follow up- PCP 1 week     Daleen Bo, MD 09/10/15 [id number]  Daleen Bo, MD 10/04/15 [place], MD 10/04/15 [phone]

## 2015-09-10 NOTE — ED Notes (Signed)
Pt  Reports  Headache      Not  releived  By    trammadol       Also reports  Neck pain  As  Well     Pt  denys  Any  injurys        She  Is  Sitting upright on  The  Exam table  Speaking in  Complete     sentances

## 2015-09-10 NOTE — ED Notes (Signed)
Pt from home for eval of HA and neck pain since Tuesday, pt states has taken a tramadol with no relief. Denies any fever, n/v/d at this time. Pt denies taking any blood thinners. Alert and oriented. Ambulatory. PERRLA, no neuro deficits.

## 2015-09-10 NOTE — ED Provider Notes (Addendum)
CSN: [id number]     Arrival date & time 09/10/15  1318 History   First MD Initiated Contact with Patient 09/10/15 1434     Chief Complaint  Patient presents with  . Headache   (Consider location/radiation/quality/duration/timing/severity/associated sxs/prior Treatment) Patient is a 74 y.o. female presenting with headaches. The history is provided by the patient.  Headache Pain location:  Occipital Quality:  Dull Radiates to:  R neck and L neck Severity currently:  8/10 Severity at highest:  8/10 Onset quality:  Gradual Timing:  Constant Progression:  Worsening Chronicity:  New Similar to prior headaches: no   Context: activity (worse with movement of neck and when trying to sit from a laying position) and bright light   Context: not loud noise   Relieved by:  Nothing Worsened by:  Neck movement, activity and light Ineffective treatments: tramadol. Associated symptoms: blurred vision (chronic blurred vision in right eye that is not worsened with current headache), dizziness (when pain is severe), nausea, neck pain, neck stiffness and photophobia   Associated symptoms: no cough, no diarrhea, no fever, no focal weakness, no hearing loss, no loss of balance, no numbness, no paresthesias, no sinus pressure, no tingling, no URI, no vomiting and no weakness   Dizziness:    Severity:  Mild   Timing:  Intermittent Nausea:    Severity:  Mild   Timing:  Intermittent Risk factors: no anger, does not have insomnia and lifestyle not sedentary     Past Medical History  Diagnosis Date  . Diabetes mellitus     Pre  . Hypertension   . Arthritis     Osteoarthritis  . Anemia   . History of blood transfusion   . Chronic kidney disease     Chronic renal insufficiency  . Pancreatic cyst 1999  . Thyroid disease     Hyperparathyroid   . GERD (gastroesophageal reflux disease)   . Gout   . Osteopenia   . Diverticulosis   . Hiatal hernia   . Hyperlipidemia   . Heart murmur     hx of in  childhood   Past Surgical History  Procedure Laterality Date  . Pancreatic cyst excision  1999  . Joint replacement  2012    left knee  . Eye surgery Left     cataract extraction with IOL  . Cholecystectomy    . 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 Colonoscopy  03/21/12    Next one in 2018  . Left knee replacement     . 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 Hypertension Mother   . Cancer Mother     Colon with METS  . Diabetes Mother   . Cancer Father     Prostate and Throat   Social History  Substance Use Topics  . Smoking status: Never Smoker   . Smokeless tobacco: Never Used  . Alcohol Use: No   OB History    No data available     Review of Systems  Constitutional: Negative.  Negative for fever, activity change and appetite change.  HENT: Negative for hearing loss and sinus pressure.   Eyes: Positive for blurred  vision (chronic blurred vision in right eye that is not worsened with current headache) and photophobia.  Respiratory: Negative for cough.   Cardiovascular: Negative.   Gastrointestinal: Positive for nausea. Negative for vomiting and diarrhea.  Musculoskeletal: Positive for neck pain and neck stiffness.  Skin: Negative.   Neurological: Positive for dizziness (when pain is severe) and headaches. Negative for focal weakness, speech difficulty, weakness, light-headedness, numbness, paresthesias and loss of balance.    Allergies  Ace inhibitors and Nsaids  Home Medications   Prior to Admission medications   Medication Sig Start Date End Date Taking? Authorizing Provider  ACCU-CHEK AVIVA PLUS test strip Test once  daily    Unk Pinto, MD  allopurinol (ZYLOPRIM) 300 MG tablet Take 1/2 to 1 tablet by  mouth daily as directed 08/28/15   [name], PA-C  amLODipine (NORVASC) 5 MG tablet Take 2.5 mg by mouth at bedtime.  03/22/15   Historical Provider, MD  cloNIDine (CATAPRES) 0.1 MG tablet Take 1 tablet (0.1 mg total) by mouth 2 (two) times daily. 07/30/15 07/29/16  Unk Pinto, MD  docusate sodium (COLACE) 100 MG capsule Take 100 mg by mouth 2 (two) times daily.    Historical Provider, MD  Ferrous Sulfate (SLOW FE PO) Take 45 mg by mouth 2 (two) times daily.    Historical Provider, MD  furosemide (LASIX) 40 MG tablet Take 40 mg by mouth daily.    Historical Provider, MD  hydrALAZINE (APRESOLINE) 10 MG tablet Take 2 tablets (20 mg total) by mouth 2 (two) times daily. 04/23/15   Minus Breeding, MD  labetalol (NORMODYNE) 200 MG tablet Take 1 tablet by mouth two  times daily 08/01/15   Unk Pinto, MD  zinc gluconate 50 MG tablet Take 50 mg by mouth every morning.    Historical Provider, MD   Meds Ordered and Administered this Visit  Medications - No data to display  BP 197/84 mmHg  Pulse 76  Temp(Src) 98.4 F (36.9 C) (Oral)  SpO2 96% No data found.   Physical Exam  Constitutional: She is oriented to person, place, and time. She appears well-developed and well-nourished.  HENT:  Mouth/Throat: Oropharynx is clear and moist.  Eyes: Pupils are equal, round, and reactive to light.  Neck: Trachea normal. Normal carotid pulses present. Muscular tenderness present. Carotid bruit is not present. Rigidity present. Decreased range of motion present. No Brudzinski's sign and no Kernig's sign noted.  Cardiovascular: Normal rate and normal heart sounds.   Pulmonary/Chest: Effort normal and breath sounds normal.  Lymphadenopathy:    She has no cervical adenopathy.  Neurological: She is alert and oriented to person, place, and time. No cranial nerve deficit. Coordination normal.  Skin: Skin is warm and  dry.  Nursing note and vitals reviewed.   ED Course  Procedures (including critical care time)  Labs Review Labs Reviewed - No data to display  Imaging Review No results found.   Visual Acuity Review  Right Eye Distance:   Left Eye Distance:   Bilateral Distance:    Right Eye Near:   Left Eye Near:    Bilateral Near:         MDM   1. Acute intractable headache, unspecified headache type     Sent for eval of worst ever HA, neck pain and constant sx for 3 days assoc syst hypertension.   Billy Fischer, MD 09/10/15 1511  Billy Fischer, MD 09/10/15 [phone]

## 2015-09-10 NOTE — Discharge Instructions (Signed)
The pain in your head is likely secondary to arthritis in the lower cervical spine. Treatments for this include rest, heat and anti-inflammatory medication. You should follow-up with her doctor next week for a checkup.    Musculoskeletal Pain Musculoskeletal pain is muscle and boney aches and pains. These pains can occur in any part of the body. Your caregiver may treat you without knowing the cause of the pain. They may treat you if blood or urine tests, X-rays, and other tests were normal.  CAUSES There is often not a definite cause or reason for these pains. These pains may be caused by a type of germ (virus). The discomfort may also come from overuse. Overuse includes working out too hard when your body is not fit. Boney aches also come from weather changes. Bone is sensitive to atmospheric pressure changes. HOME CARE INSTRUCTIONS   Ask when your test results will be ready. Make sure you get your test results.  Only take over-the-counter or prescription medicines for pain, discomfort, or fever as directed by your caregiver. If you were given medications for your condition, do not drive, operate machinery or power tools, or sign legal documents for 24 hours. Do not drink alcohol. Do not take sleeping pills or other medications that may interfere with treatment.  Continue all activities unless the activities cause more pain. When the pain lessens, slowly resume normal activities. Gradually increase the intensity and duration of the activities or exercise.  During periods of severe pain, bed rest may be helpful. Lay or sit in any position that is comfortable.  Putting ice on the injured area.  Put ice in a bag.  Place a towel between your skin and the bag.  Leave the ice on for 15 to 20 minutes, 3 to 4 times a day.  Follow up with your caregiver for continued problems and no reason can be found for the pain. If the pain becomes worse or does not go away, it may be necessary to repeat  tests or do additional testing. Your caregiver may need to look further for a possible cause. SEEK IMMEDIATE MEDICAL CARE IF:  You have pain that is getting worse and is not relieved by medications.  You develop chest pain that is associated with shortness or breath, sweating, feeling sick to your stomach (nauseous), or throw up (vomit).  Your pain becomes localized to the abdomen.  You develop any new symptoms that seem different or that concern you. MAKE SURE YOU:   Understand these instructions.  Will watch your condition.  Will get help right away if you are not doing well or get worse.   This information is not intended to replace advice given to you by your health care provider. Make sure you discuss any questions you have with your health care provider.   Document Released: 11/06/2005 Document Revised: 01/29/2012 Document Reviewed: 07/11/2013 Elsevier Interactive Patient Education [employer].

## 2015-09-19 ENCOUNTER — Other Ambulatory Visit: Payer: Self-pay | Admitting: Cardiology

## 2015-09-24 DIAGNOSIS — Z961 Presence of intraocular lens: Secondary | ICD-10-CM | POA: Diagnosis not present

## 2015-09-24 DIAGNOSIS — E113392 Type 2 diabetes mellitus with moderate nonproliferative diabetic retinopathy without macular edema, left eye: Secondary | ICD-10-CM | POA: Diagnosis not present

## 2015-09-24 DIAGNOSIS — H35033 Hypertensive retinopathy, bilateral: Secondary | ICD-10-CM | POA: Diagnosis not present

## 2015-09-24 DIAGNOSIS — E113291 Type 2 diabetes mellitus with mild nonproliferative diabetic retinopathy without macular edema, right eye: Secondary | ICD-10-CM | POA: Diagnosis not present

## 2015-09-24 DIAGNOSIS — H179 Unspecified corneal scar and opacity: Secondary | ICD-10-CM | POA: Diagnosis not present

## 2015-09-28 ENCOUNTER — Other Ambulatory Visit: Payer: Self-pay | Admitting: *Deleted

## 2015-10-01 ENCOUNTER — Encounter (HOSPITAL_COMMUNITY)
Admission: RE | Admit: 2015-10-01 | Discharge: 2015-10-01 | Disposition: A | Discharge status: Home / Self Care | Payer: Medicare Other | Source: Ambulatory Visit | Attending: Nephrology | Admitting: Nephrology

## 2015-10-01 DIAGNOSIS — D631 Anemia in chronic kidney disease: Secondary | ICD-10-CM | POA: Diagnosis not present

## 2015-10-01 DIAGNOSIS — N183 Chronic kidney disease, stage 3 (moderate): Secondary | ICD-10-CM | POA: Insufficient documentation

## 2015-10-01 LAB — RENAL FUNCTION PANEL
ALBUMIN: 3.2 g/dL — AB (ref 3.5–5.0)
ANION GAP: 10 (ref 5–15)
BUN: 91 mg/dL — ABNORMAL HIGH (ref 6–20)
CALCIUM: 9.5 mg/dL (ref 8.9–10.3)
CO2: 16 mmol/L — ABNORMAL LOW (ref 22–32)
CREATININE: 5.81 mg/dL — AB (ref 0.44–1.00)
Chloride: 115 mmol/L — ABNORMAL HIGH (ref 101–111)
GFR, EST AFRICAN AMERICAN: 7 mL/min — AB (ref >60)
GFR, EST NON AFRICAN AMERICAN: 6 mL/min — AB (ref >60)
Glucose, Bld: 109 mg/dL — ABNORMAL HIGH (ref 65–99)
PHOSPHORUS: 5.8 mg/dL — AB (ref 2.5–4.6)
Potassium: 5 mmol/L (ref 3.5–5.1)
SODIUM: 141 mmol/L (ref 135–145)

## 2015-10-01 LAB — IRON AND TIBC
Iron: 74 ug/dL (ref 28–170)
Saturation Ratios: 30 % (ref 10.4–31.8)
TIBC: 248 ug/dL — AB (ref 250–450)
UIBC: 174 ug/dL

## 2015-10-01 LAB — POCT HEMOGLOBIN-HEMACUE: HEMOGLOBIN: 10.6 g/dL — AB (ref 12.0–15.0)

## 2015-10-01 LAB — FERRITIN: FERRITIN: 406 ng/mL — AB (ref 11–307)

## 2015-10-01 MED ORDER — EPOETIN ALFA 20000 UNIT/ML IJ SOLN
INTRAMUSCULAR | Status: AC
  Filled 2015-10-01: qty 1

## 2015-10-01 MED ORDER — EPOETIN ALFA 20000 UNIT/ML IJ SOLN
20000.0000 [IU] | INTRAMUSCULAR | Status: DC
  Administered 2015-10-01: 20000 [IU] via SUBCUTANEOUS

## 2015-10-02 LAB — PTH, INTACT AND CALCIUM
CALCIUM TOTAL (PTH): 9.2 mg/dL (ref 8.7–10.3)
PTH: 885 pg/mL — AB (ref 15–65)

## 2015-10-10 ENCOUNTER — Encounter: Payer: Self-pay | Admitting: *Deleted

## 2015-10-16 ENCOUNTER — Other Ambulatory Visit: Payer: Self-pay | Admitting: Physician Assistant

## 2015-10-19 DIAGNOSIS — I129 Hypertensive chronic kidney disease with stage 1 through stage 4 chronic kidney disease, or unspecified chronic kidney disease: Secondary | ICD-10-CM | POA: Diagnosis not present

## 2015-10-19 DIAGNOSIS — E785 Hyperlipidemia, unspecified: Secondary | ICD-10-CM | POA: Diagnosis not present

## 2015-10-19 DIAGNOSIS — E875 Hyperkalemia: Secondary | ICD-10-CM | POA: Diagnosis not present

## 2015-10-19 DIAGNOSIS — N184 Chronic kidney disease, stage 4 (severe): Secondary | ICD-10-CM | POA: Diagnosis not present

## 2015-10-19 DIAGNOSIS — D638 Anemia in other chronic diseases classified elsewhere: Secondary | ICD-10-CM | POA: Diagnosis not present

## 2015-10-22 ENCOUNTER — Ambulatory Visit: Payer: Self-pay | Admitting: Internal Medicine

## 2015-10-26 ENCOUNTER — Other Ambulatory Visit: Payer: Self-pay

## 2015-10-26 MED ORDER — GLUCOSE BLOOD VI STRP: ORAL_STRIP | Status: DC

## 2015-10-28 ENCOUNTER — Encounter (HOSPITAL_COMMUNITY)
Admission: RE | Admit: 2015-10-28 | Discharge: 2015-10-28 | Disposition: A | Discharge status: Home / Self Care | Payer: Medicare Other | Source: Ambulatory Visit | Attending: Nephrology | Admitting: Nephrology

## 2015-10-28 DIAGNOSIS — D631 Anemia in chronic kidney disease: Secondary | ICD-10-CM | POA: Insufficient documentation

## 2015-10-28 DIAGNOSIS — N183 Chronic kidney disease, stage 3 (moderate): Secondary | ICD-10-CM | POA: Insufficient documentation

## 2015-10-28 LAB — RENAL FUNCTION PANEL
Albumin: 3.2 g/dL — ABNORMAL LOW (ref 3.5–5.0)
Anion gap: 15 (ref 5–15)
BUN: 101 mg/dL — AB (ref 6–20)
CHLORIDE: 108 mmol/L (ref 101–111)
CO2: 15 mmol/L — AB (ref 22–32)
CREATININE: 6.99 mg/dL — AB (ref 0.44–1.00)
Calcium: 9.3 mg/dL (ref 8.9–10.3)
GFR calc Af Amer: 6 mL/min — ABNORMAL LOW (ref >60)
GFR calc non Af Amer: 5 mL/min — ABNORMAL LOW (ref >60)
GLUCOSE: 110 mg/dL — AB (ref 65–99)
Phosphorus: 7 mg/dL — ABNORMAL HIGH (ref 2.5–4.6)
Potassium: 4.6 mmol/L (ref 3.5–5.1)
Sodium: 138 mmol/L (ref 135–145)

## 2015-10-28 LAB — IRON AND TIBC
IRON: 94 ug/dL (ref 28–170)
Saturation Ratios: 37 % — ABNORMAL HIGH (ref 10.4–31.8)
TIBC: 252 ug/dL (ref 250–450)
UIBC: 158 ug/dL

## 2015-10-28 LAB — POCT HEMOGLOBIN-HEMACUE: Hemoglobin: 9.6 g/dL — ABNORMAL LOW (ref 12.0–15.0)

## 2015-10-28 LAB — FERRITIN: FERRITIN: 445 ng/mL — AB (ref 11–307)

## 2015-10-28 MED ORDER — EPOETIN ALFA 20000 UNIT/ML IJ SOLN
20000.0000 [IU] | INTRAMUSCULAR | Status: DC
  Administered 2015-10-28: 20000 [IU] via SUBCUTANEOUS

## 2015-10-28 MED ORDER — EPOETIN ALFA 20000 UNIT/ML IJ SOLN
INTRAMUSCULAR | Status: AC
  Filled 2015-10-28: qty 1

## 2015-10-29 ENCOUNTER — Encounter: Payer: Self-pay | Admitting: Internal Medicine

## 2015-10-29 ENCOUNTER — Ambulatory Visit (INDEPENDENT_AMBULATORY_CARE_PROVIDER_SITE_OTHER): Payer: Medicare Other | Admitting: Internal Medicine

## 2015-10-29 ENCOUNTER — Other Ambulatory Visit (HOSPITAL_COMMUNITY): Payer: Self-pay

## 2015-10-29 VITALS — BP 122/60 | HR 64 | Temp 97.9°F | Resp 16 | Ht 62.0 in | Wt 137.2 lb

## 2015-10-29 DIAGNOSIS — I10 Essential (primary) hypertension: Secondary | ICD-10-CM | POA: Diagnosis not present

## 2015-10-29 DIAGNOSIS — Z0001 Encounter for general adult medical examination with abnormal findings: Secondary | ICD-10-CM

## 2015-10-29 DIAGNOSIS — Z0000 Encounter for general adult medical examination without abnormal findings: Secondary | ICD-10-CM

## 2015-10-29 DIAGNOSIS — E559 Vitamin D deficiency, unspecified: Secondary | ICD-10-CM | POA: Diagnosis not present

## 2015-10-29 DIAGNOSIS — Z1331 Encounter for screening for depression: Secondary | ICD-10-CM

## 2015-10-29 DIAGNOSIS — K2100 Gastro-esophageal reflux disease with esophagitis, without bleeding: Secondary | ICD-10-CM

## 2015-10-29 DIAGNOSIS — Z9181 History of falling: Secondary | ICD-10-CM

## 2015-10-29 DIAGNOSIS — D638 Anemia in other chronic diseases classified elsewhere: Secondary | ICD-10-CM

## 2015-10-29 DIAGNOSIS — Z1389 Encounter for screening for other disorder: Secondary | ICD-10-CM | POA: Diagnosis not present

## 2015-10-29 DIAGNOSIS — R7309 Other abnormal glucose: Secondary | ICD-10-CM | POA: Diagnosis not present

## 2015-10-29 DIAGNOSIS — E1122 Type 2 diabetes mellitus with diabetic chronic kidney disease: Secondary | ICD-10-CM

## 2015-10-29 DIAGNOSIS — Z1212 Encounter for screening for malignant neoplasm of rectum: Secondary | ICD-10-CM

## 2015-10-29 DIAGNOSIS — Z6825 Body mass index (BMI) 25.0-25.9, adult: Secondary | ICD-10-CM

## 2015-10-29 DIAGNOSIS — N185 Chronic kidney disease, stage 5: Secondary | ICD-10-CM

## 2015-10-29 DIAGNOSIS — E782 Mixed hyperlipidemia: Secondary | ICD-10-CM

## 2015-10-29 DIAGNOSIS — Z789 Other specified health status: Secondary | ICD-10-CM | POA: Diagnosis not present

## 2015-10-29 DIAGNOSIS — Z79899 Other long term (current) drug therapy: Secondary | ICD-10-CM

## 2015-10-29 DIAGNOSIS — N184 Chronic kidney disease, stage 4 (severe): Secondary | ICD-10-CM | POA: Diagnosis not present

## 2015-10-29 DIAGNOSIS — N186 End stage renal disease: Secondary | ICD-10-CM

## 2015-10-29 LAB — HEPATIC FUNCTION PANEL
ALT: 34 U/L — AB (ref 6–29)
AST: 32 U/L (ref 10–35)
Albumin: 3.7 g/dL (ref 3.6–5.1)
Alkaline Phosphatase: 208 U/L — ABNORMAL HIGH (ref 33–130)
BILIRUBIN DIRECT: 0.1 mg/dL (ref <=0.2)
Indirect Bilirubin: 0.3 mg/dL (ref 0.2–1.2)
TOTAL PROTEIN: 6.3 g/dL (ref 6.1–8.1)
Total Bilirubin: 0.4 mg/dL (ref 0.2–1.2)

## 2015-10-29 LAB — CBC WITH DIFFERENTIAL/PLATELET
BASOS PCT: 0 % (ref 0–1)
Basophils Absolute: 0 10*3/uL (ref 0.0–0.1)
EOS ABS: 0.2 10*3/uL (ref 0.0–0.7)
EOS PCT: 3 % (ref 0–5)
HCT: 30.6 % — ABNORMAL LOW (ref 36.0–46.0)
HEMOGLOBIN: 9.5 g/dL — AB (ref 12.0–15.0)
Lymphocytes Relative: 27 % (ref 12–46)
Lymphs Abs: 1.7 10*3/uL (ref 0.7–4.0)
MCH: 27 pg (ref 26.0–34.0)
MCHC: 31 g/dL (ref 30.0–36.0)
MCV: 86.9 fL (ref 78.0–100.0)
MONO ABS: 0.4 10*3/uL (ref 0.1–1.0)
MONOS PCT: 6 % (ref 3–12)
MPV: 10 fL (ref 8.6–12.4)
NEUTROS ABS: 4.1 10*3/uL (ref 1.7–7.7)
Neutrophils Relative %: 64 % (ref 43–77)
Platelets: 226 10*3/uL (ref 150–400)
RBC: 3.52 MIL/uL — ABNORMAL LOW (ref 3.87–5.11)
RDW: 18.5 % — ABNORMAL HIGH (ref 11.5–15.5)
WBC: 6.4 10*3/uL (ref 4.0–10.5)

## 2015-10-29 LAB — LIPID PANEL
CHOLESTEROL: 238 mg/dL — AB (ref 125–200)
HDL: 98 mg/dL (ref >=46)
LDL Cholesterol: 129 mg/dL (ref <130)
Total CHOL/HDL Ratio: 2.4 Ratio (ref <=5.0)
Triglycerides: 56 mg/dL (ref <150)
VLDL: 11 mg/dL (ref <30)

## 2015-10-29 LAB — HEMOGLOBIN A1C
HEMOGLOBIN A1C: 5.7 % — AB (ref <5.7)
MEAN PLASMA GLUCOSE: 117 mg/dL — AB (ref <117)

## 2015-10-29 LAB — PTH, INTACT AND CALCIUM
Calcium, Total (PTH): 8.9 mg/dL (ref 8.7–10.3)
PTH: 1124 pg/mL — ABNORMAL HIGH (ref 15–65)

## 2015-10-29 LAB — BASIC METABOLIC PANEL WITH GFR
BUN: 98 mg/dL — ABNORMAL HIGH (ref 7–25)
CALCIUM: 8.9 mg/dL (ref 8.6–10.4)
CO2: 18 mmol/L — AB (ref 20–31)
CREATININE: 7.39 mg/dL — AB (ref 0.60–0.93)
Chloride: 107 mmol/L (ref 98–110)
GFR, Est African American: 6 mL/min — ABNORMAL LOW (ref >=60)
GFR, Est Non African American: 5 mL/min — ABNORMAL LOW (ref >=60)
GLUCOSE: 113 mg/dL — AB (ref 65–99)
Potassium: 5 mmol/L (ref 3.5–5.3)
SODIUM: 140 mmol/L (ref 135–146)

## 2015-10-29 LAB — MAGNESIUM: MAGNESIUM: 1.7 mg/dL (ref 1.5–2.5)

## 2015-10-29 LAB — HEPATITIS B SURFACE ANTIBODY, QUANTITATIVE

## 2015-10-29 LAB — TSH: TSH: 2.467 u[IU]/mL (ref 0.350–4.500)

## 2015-10-29 NOTE — Patient Instructions (Signed)

## 2015-10-30 ENCOUNTER — Other Ambulatory Visit: Payer: Self-pay | Admitting: Internal Medicine

## 2015-10-30 DIAGNOSIS — N390 Urinary tract infection, site not specified: Secondary | ICD-10-CM

## 2015-10-30 LAB — URINALYSIS, ROUTINE W REFLEX MICROSCOPIC
BILIRUBIN URINE: NEGATIVE
GLUCOSE, UA: NEGATIVE
HGB URINE DIPSTICK: NEGATIVE
KETONES UR: NEGATIVE
Nitrite: NEGATIVE
Specific Gravity, Urine: 1.012 (ref 1.001–1.035)
pH: 5 (ref 5.0–8.0)

## 2015-10-30 LAB — MICROALBUMIN / CREATININE URINE RATIO
CREATININE, URINE: 69 mg/dL (ref 20–320)
MICROALB UR: 170 mg/dL
MICROALB/CREAT RATIO: 2464 ug/mg{creat} — AB (ref <30)

## 2015-10-30 LAB — URINALYSIS, MICROSCOPIC ONLY
CASTS: NONE SEEN [LPF]
CRYSTALS: NONE SEEN [HPF]
RBC / HPF: NONE SEEN RBC/HPF (ref <=2)
YEAST: NONE SEEN [HPF]

## 2015-10-30 LAB — INSULIN, RANDOM: INSULIN: 3.4 u[IU]/mL (ref 2.0–19.6)

## 2015-10-30 LAB — HEPATITIS B SURFACE ANTIGEN: Hepatitis B Surface Ag: NEGATIVE

## 2015-10-30 LAB — VITAMIN D 25 HYDROXY (VIT D DEFICIENCY, FRACTURES): VIT D 25 HYDROXY: 31 ng/mL (ref 30–100)

## 2015-10-31 DIAGNOSIS — N186 End stage renal disease: Secondary | ICD-10-CM | POA: Insufficient documentation

## 2015-10-31 DIAGNOSIS — E663 Overweight: Secondary | ICD-10-CM

## 2015-10-31 HISTORY — DX: Overweight: E66.3

## 2015-10-31 NOTE — Progress Notes (Signed)
Patient ID: Eileen Perez, female   DOB: 1940/12/06, 74 y.o.   MRN: [id number]  Annual Screening/Preventative Visit And Comprehensive Evaluation &  Examination  This very nice 74 y.o. DBF presents for presents for a Wellness/Preventative Visit & comprehensive evaluation and management of multiple medical co-morbidities.  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    HTN predates circa 1990's. Patient's BP has been controlled at home and patient denies any cardiac symptoms as chest pain, palpitations, shortness of breath, dizziness or ankle swelling. Today's BP: 122/60 mmHg    Patient's hyperlipidemia is controlled with diet and medications. Patient denies myalgias or other medication SE's. Current lipids are not st goal with Cholesterol 238*; HDL 98; LDL 129;and Triglycerides 56.   Patient has diet controlled T2_NIDDM since 1999 and patient denies reactive hypoglycemic symptoms, visual blurring, diabetic polys, or paresthesias. Patient has declining Renal functions with last GFR at 9 ml/min and current GFR 5 ml/min fcc ESRD attributed to both her HTN & her T2_DM. She is followed closely By Dr Moshe Cipro. Current  A1c 5.7% is at goal.    Finally, patient has history of Vitamin D Deficiency and current Vitamin D is still low at 31.   Medication Sig  . allopurinol  300 MG tablet Take 1/2 to 1 tablet by  mouth daily as directed  . amLODipine  5 MG tablet Take 2.5 mg by mouth at bedtime.   Marland Kitchen CALCIUM PO Take by mouth.  Marland Kitchen VITAMIN D Take 1 tablet by mouth daily.  . cloNIDine  0.1 MG tablet Take 1 tablet (0.1 mg total) by mouth 2 (two) times daily.  Marland Kitchen COLACE 100 MG capsule Take 100 mg by mouth 2 (two) times daily.  Marland Kitchen SLOW FE Take 45 mg by mouth 2 (two) times daily.  Marland Kitchen FLAXSEED OIL Take 1 tablet by mouth daily.  . furosemide  40 MG tablet Take 40 mg by mouth daily.  . hydrALAZINE  10 MG tablet Take 2 tablets by mouth two times daily  . labetalol  200  MG tablet Take 1 tablet by mouth two  times daily  . MULTIVITAMIN WITH MINERALS  Take 1 tablet by mouth daily.  Marland Kitchen omega-3 acid ethyl esters (LOVAZA) 1 G capsule Take 1 g by mouth 2 (two) times daily.  . Red Yeast Rice Extract  Take 1 tablet by mouth daily.  Marland Kitchen zinc gluconate 50 MG tablet Take 50 mg by mouth every morning.   Allergies  Allergen Reactions  . Ace Inhibitors     unknown  . Nsaids     unknown   Past Medical History  Diagnosis Date  . Diabetes mellitus     Pre  . Hypertension   . Arthritis     Osteoarthritis  . Anemia   . History of blood transfusion   . Chronic kidney disease     Chronic renal insufficiency  . Pancreatic cyst 1999  . Thyroid disease     Hyperparathyroid   . GERD (gastroesophageal reflux disease)   . Gout   . Osteopenia   . Diverticulosis   . Hiatal hernia   . Hyperlipidemia   . Heart murmur     hx of in childhood   Health Maintenance  Topic Date Due  . PNA vac Low Risk Adult (2 of 2 - PCV13) 11/20/2010  . TETANUS/TDAP  11/20/2013  . FOOT EXAM  02/10/2015  . HEMOGLOBIN A1C  04/28/2016  . INFLUENZA VACCINE  06/20/2016  .  OPHTHALMOLOGY EXAM  09/23/2016  . URINE MICROALBUMIN  10/28/2016  . COLONOSCOPY  09/16/2023  . DEXA SCAN  Completed  . ZOSTAVAX  Completed   Immunization History  Administered Date(s) Administered  . DT 02/05/2014  . Influenza, High Dose Seasonal PF 09/25/2013  . Influenza-Unspecified 08/11/2015  . Pneumococcal Polysaccharide-23 11/20/2009  . Td 11/21/2003  . Zoster 11/20/2005   Past Surgical History  Procedure Laterality Date  . Pancreatic cyst excision  1999  . Joint replacement  2012    left knee  . Eye surgery Left     cataract extraction with IOL  . Cholecystectomy    . 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 Colonoscopy  03/21/12    Next one in 2018  . Left knee  replacement     . 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 Hypertension Mother   . Cancer Mother     Colon with METS  . Diabetes Mother   . Cancer Father     Prostate and Throat   Social History  Substance Use Topics  . Smoking status: Never Smoker   . Smokeless tobacco: Never Used  . Alcohol Use: No    ROS Constitutional: Denies fever, chills, weight loss/gain, headaches, insomnia,  night sweats, and change in appetite. Does c/o fatigue. Eyes: Denies redness, blurred vision, diplopia, discharge, itchy, watery eyes.  ENT: Denies discharge, congestion, post nasal drip, epistaxis, sore throat, earache, hearing loss, dental pain, Tinnitus, Vertigo, Sinus pain, snoring.  Cardio: Denies chest pain, palpitations, irregular heartbeat, syncope, dyspnea, diaphoresis, orthopnea, PND, claudication, edema Respiratory: denies cough, dyspnea, DOE, pleurisy, hoarseness, laryngitis, wheezing.  Gastrointestinal: Denies dysphagia, heartburn, reflux, water brash, pain, cramps, nausea, vomiting, bloating, diarrhea, constipation, hematemesis, melena, hematochezia, jaundice, hemorrhoids Genitourinary: Denies dysuria, frequency, urgency, nocturia, hesitancy, discharge, hematuria, flank pain Breast: Breast lumps, nipple discharge, bleeding.  Musculoskeletal: Denies arthralgia, myalgia, stiffness, Jt. Swelling, pain, limp, and strain/sprain. Denies falls / fx's. Skin: Denies puritis, rash, hives, warts, acne, eczema, changing in skin lesion Neuro: No weakness, tremor, incoordination, spasms, paresthesia, pain Psychiatric: Denies confusion, memory loss, sensory loss. Denies Depression / Modd changes. Endocrine: Denies change in weight, skin, hair change, nocturia, and paresthesia, diabetic polys, visual blurring,  hyper / hypo glycemic episodes.  Heme/Lymph: No excessive bleeding, bruising, enlarged lymph nodes.  Physical Exam  BP 122/60 mmHg  Pulse 64  Temp(Src) 97.9 F (36.6 C)  Resp 16  Ht 5\' 2"  (1.575 m)  Wt 137 lb 3.2 oz (62.234 kg)  BMI 25.09 kg/m2  General Appearance: Well nourished and in no apparent distress. Eyes: PERRLA, EOMs, conjunctiva no swelling or erythema, normal fundi and vessels. Sinuses: No frontal/maxillary tenderness ENT/Mouth: EACs patent / TMs  nl. Nares clear without erythema, swelling, mucoid exudates. Oral hygiene is good. No erythema, swelling, or exudate. Tongue normal, non-obstructing. Tonsils not swollen or erythematous. Hearing normal.  Neck: Supple, thyroid normal. No bruits, nodes or JVD. Respiratory: Respiratory effort normal.  BS equal and clear bilateral without rales, rhonci, wheezing or stridor. Cardio: Heart sounds are normal with regular rate and rhythm and no murmurs, rubs or gallops. Peripheral pulses are normal and equal bilaterally without edema. No  aortic or femoral bruits. Chest: symmetric with normal excursions and percussion. Breasts: Symmetric, without lumps, nipple discharge, retractions, or fibrocystic changes.  Abdomen: Flat, soft, with bowl sounds. Nontender, no guarding, rebound, hernias, masses, or organomegaly.  Lymphatics: Non tender without lymphadenopathy.   Musculoskeletal: Full ROM all peripheral extremities, joint stability, 5/5 strength, and normal gait. Skin: Warm and dry without rashes, lesions, cyanosis, clubbing or  ecchymosis.  Neuro: Cranial nerves intact, reflexes equal bilaterally. Normal muscle tone, no cerebellar symptoms. Sensation intact bilaterally to the toes to touch, vibratory and Monofilament testing.  Pysch: Alert and oriented X 3, normal affect, Insight and Judgment appropriate.   Assessment and Plan  1. Annual Preventative Screening Examination  - Microalbumin / creatinine urine ratio - EKG 12-Lead - POC  Hemoccult Bld/Stl  - Urinalysis, Routine w reflex microscopic  - CBC with Differential/Platelet - BASIC METABOLIC PANEL WITH GFR - Hepatic function panel - Magnesium - Lipid panel - TSH - Hemoglobin A1c - Insulin, random - VITAMIN D 25 Hydroxy (  2. Essential hypertension  - EKG 12-Lead - TSH  3. Hyperlipidemia  - Lipid panel - TSH  4. Type 2 diabetes mellitus with stage 5 chronic kidney disease not on chronic dialysis, without long-term current use of insulin ([place])   5. Vitamin D deficiency  - VITAMIN D 25 Hydroxy   6. ESRD (end stage renal disease) ([place])  - Microalbumin / creatinine urine ratio - Hemoglobin A1c - Insulin, random  7. Anemia of chronic Renal Dz   8. GERD   9. Screening for rectal cancer  - POC Hemoccult Bld/Stl   10. Depression screen   11. At low risk for fall   12. BMI 25.0-25.9,adult   13. Medication management  - Urinalysis, Routine w reflex microscopic  - CBC with Differential/Platelet - BASIC METABOLIC PANEL WITH GFR - Hepatic function panel - Magnesium   Continue prudent diet as discussed, weight control, BP monitoring, regular exercise, and medications. Discussed med's effects and SE's. Screening labs and tests as requested with regular follow-up as recommended. Over 40 minutes of exam, counseling, chart review and high complex critical decision making was performed.

## 2015-11-08 ENCOUNTER — Ambulatory Visit: Payer: Medicare Other

## 2015-11-08 DIAGNOSIS — N390 Urinary tract infection, site not specified: Secondary | ICD-10-CM

## 2015-11-08 LAB — URINALYSIS, ROUTINE W REFLEX MICROSCOPIC
BILIRUBIN URINE: NEGATIVE
KETONES UR: NEGATIVE
Leukocytes, UA: NEGATIVE
Nitrite: NEGATIVE
SPECIFIC GRAVITY, URINE: 1.014 (ref 1.001–1.035)
pH: 5.5 (ref 5.0–8.0)

## 2015-11-08 LAB — URINALYSIS, MICROSCOPIC ONLY
BACTERIA UA: NONE SEEN [HPF]
CRYSTALS: NONE SEEN [HPF]
Casts: NONE SEEN [LPF]
Squamous Epithelial / LPF: NONE SEEN [HPF] (ref <=5)
YEAST: NONE SEEN [HPF]

## 2015-11-09 LAB — URINE CULTURE
Colony Count: NO GROWTH
ORGANISM ID, BACTERIA: NO GROWTH

## 2015-11-19 ENCOUNTER — Encounter (HOSPITAL_COMMUNITY)
Admission: RE | Admit: 2015-11-19 | Discharge: 2015-11-19 | Disposition: A | Discharge status: Home / Self Care | Payer: Medicare Other | Source: Ambulatory Visit | Attending: Nephrology | Admitting: Nephrology

## 2015-11-19 DIAGNOSIS — N183 Chronic kidney disease, stage 3 (moderate): Secondary | ICD-10-CM | POA: Diagnosis not present

## 2015-11-19 DIAGNOSIS — D631 Anemia in chronic kidney disease: Secondary | ICD-10-CM | POA: Diagnosis not present

## 2015-11-19 MED ORDER — EPOETIN ALFA 20000 UNIT/ML IJ SOLN
20000.0000 [IU] | INTRAMUSCULAR | Status: DC
  Administered 2015-11-19: 20000 [IU] via SUBCUTANEOUS

## 2015-11-19 MED ORDER — EPOETIN ALFA 20000 UNIT/ML IJ SOLN
INTRAMUSCULAR | Status: AC
  Administered 2015-11-19: 20000 [IU] via SUBCUTANEOUS
  Filled 2015-11-19: qty 1

## 2015-11-23 LAB — POCT HEMOGLOBIN-HEMACUE: HEMOGLOBIN: 8.4 g/dL — AB (ref 12.0–15.0)

## 2015-11-26 ENCOUNTER — Encounter (HOSPITAL_COMMUNITY): Payer: Self-pay

## 2015-12-10 ENCOUNTER — Encounter (HOSPITAL_COMMUNITY)
Admission: RE | Admit: 2015-12-10 | Discharge: 2015-12-10 | Disposition: A | Discharge status: Home / Self Care | Payer: Medicare Other | Source: Ambulatory Visit | Attending: Nephrology | Admitting: Nephrology

## 2015-12-10 DIAGNOSIS — Z79899 Other long term (current) drug therapy: Secondary | ICD-10-CM | POA: Insufficient documentation

## 2015-12-10 DIAGNOSIS — D631 Anemia in chronic kidney disease: Secondary | ICD-10-CM | POA: Diagnosis not present

## 2015-12-10 DIAGNOSIS — Z5181 Encounter for therapeutic drug level monitoring: Secondary | ICD-10-CM | POA: Insufficient documentation

## 2015-12-10 DIAGNOSIS — N183 Chronic kidney disease, stage 3 (moderate): Secondary | ICD-10-CM | POA: Diagnosis not present

## 2015-12-10 LAB — RENAL FUNCTION PANEL
ALBUMIN: 3.1 g/dL — AB (ref 3.5–5.0)
ANION GAP: 16 — AB (ref 5–15)
BUN: 132 mg/dL — ABNORMAL HIGH (ref 6–20)
CHLORIDE: 110 mmol/L (ref 101–111)
CO2: 17 mmol/L — ABNORMAL LOW (ref 22–32)
Calcium: 9.3 mg/dL (ref 8.9–10.3)
Creatinine, Ser: 7.6 mg/dL — ABNORMAL HIGH (ref 0.44–1.00)
GFR, EST AFRICAN AMERICAN: 5 mL/min — AB (ref >60)
GFR, EST NON AFRICAN AMERICAN: 5 mL/min — AB (ref >60)
Glucose, Bld: 118 mg/dL — ABNORMAL HIGH (ref 65–99)
PHOSPHORUS: 8.7 mg/dL — AB (ref 2.5–4.6)
POTASSIUM: 5 mmol/L (ref 3.5–5.1)
Sodium: 143 mmol/L (ref 135–145)

## 2015-12-10 LAB — POCT HEMOGLOBIN-HEMACUE: HEMOGLOBIN: 8.5 g/dL — AB (ref 12.0–15.0)

## 2015-12-10 LAB — IRON AND TIBC
IRON: 89 ug/dL (ref 28–170)
Saturation Ratios: 38 % — ABNORMAL HIGH (ref 10.4–31.8)
TIBC: 234 ug/dL — ABNORMAL LOW (ref 250–450)
UIBC: 145 ug/dL

## 2015-12-10 LAB — FERRITIN: Ferritin: 592 ng/mL — ABNORMAL HIGH (ref 11–307)

## 2015-12-10 MED ORDER — EPOETIN ALFA 20000 UNIT/ML IJ SOLN
20000.0000 [IU] | INTRAMUSCULAR | Status: DC
  Administered 2015-12-10: 20000 [IU] via SUBCUTANEOUS

## 2015-12-10 MED ORDER — EPOETIN ALFA 20000 UNIT/ML IJ SOLN
INTRAMUSCULAR | Status: AC
  Filled 2015-12-10: qty 1

## 2015-12-11 ENCOUNTER — Encounter: Payer: Self-pay | Admitting: *Deleted

## 2015-12-11 LAB — PTH, INTACT AND CALCIUM
CALCIUM TOTAL (PTH): 9 mg/dL (ref 8.7–10.3)
PTH: 896 pg/mL — AB (ref 15–65)

## 2015-12-23 DIAGNOSIS — N185 Chronic kidney disease, stage 5: Secondary | ICD-10-CM | POA: Diagnosis not present

## 2015-12-31 ENCOUNTER — Encounter (HOSPITAL_COMMUNITY): Payer: Self-pay

## 2016-01-07 ENCOUNTER — Encounter (HOSPITAL_COMMUNITY)
Admission: RE | Admit: 2016-01-07 | Discharge: 2016-01-07 | Disposition: A | Discharge status: Home / Self Care | Payer: Medicare Other | Source: Ambulatory Visit | Attending: Nephrology | Admitting: Nephrology

## 2016-01-07 DIAGNOSIS — D631 Anemia in chronic kidney disease: Secondary | ICD-10-CM | POA: Diagnosis not present

## 2016-01-07 DIAGNOSIS — N183 Chronic kidney disease, stage 3 (moderate): Secondary | ICD-10-CM | POA: Insufficient documentation

## 2016-01-07 LAB — POCT HEMOGLOBIN-HEMACUE: HEMOGLOBIN: 7.6 g/dL — AB (ref 12.0–15.0)

## 2016-01-07 MED ORDER — EPOETIN ALFA 20000 UNIT/ML IJ SOLN
INTRAMUSCULAR | Status: AC
  Administered 2016-01-07: 20000 [IU]
  Filled 2016-01-07: qty 1

## 2016-01-07 MED ORDER — EPOETIN ALFA 20000 UNIT/ML IJ SOLN: 20000.0000 [IU] | INTRAMUSCULAR | Status: DC

## 2016-01-07 NOTE — Progress Notes (Signed)
Pt here for procrit injection.  HGB 7.6 by hemocue..  Pt states no bleeding noticed and asymptomatic.  Crystal at Dr Shelva Majestic office notified.  Instructed to give shot as ordered and to have pt return in one week for an additional shot.  Pt instructed of this and voiced understanding.  To return on 01/14/16.

## 2016-01-08 ENCOUNTER — Encounter: Payer: Self-pay | Admitting: *Deleted

## 2016-01-13 ENCOUNTER — Other Ambulatory Visit (HOSPITAL_COMMUNITY): Payer: Self-pay | Admitting: *Deleted

## 2016-01-14 ENCOUNTER — Encounter (HOSPITAL_COMMUNITY)
Admission: RE | Admit: 2016-01-14 | Discharge: 2016-01-14 | Disposition: A | Discharge status: Home / Self Care | Payer: Medicare Other | Source: Ambulatory Visit | Attending: Nephrology | Admitting: Nephrology

## 2016-01-14 DIAGNOSIS — N183 Chronic kidney disease, stage 3 (moderate): Secondary | ICD-10-CM | POA: Diagnosis not present

## 2016-01-14 DIAGNOSIS — D631 Anemia in chronic kidney disease: Secondary | ICD-10-CM | POA: Diagnosis not present

## 2016-01-14 LAB — POCT HEMOGLOBIN-HEMACUE: HEMOGLOBIN: 8.2 g/dL — AB (ref 12.0–15.0)

## 2016-01-14 MED ORDER — EPOETIN ALFA 20000 UNIT/ML IJ SOLN
INTRAMUSCULAR | Status: AC
  Filled 2016-01-14: qty 1

## 2016-01-14 MED ORDER — EPOETIN ALFA 20000 UNIT/ML IJ SOLN
20000.0000 [IU] | INTRAMUSCULAR | Status: DC
  Administered 2016-01-14: 20000 [IU] via SUBCUTANEOUS

## 2016-01-15 LAB — HEPATITIS B SURFACE ANTIGEN: HEP B S AG: NEGATIVE

## 2016-01-17 DIAGNOSIS — D513 Other dietary vitamin B12 deficiency anemia: Secondary | ICD-10-CM | POA: Diagnosis not present

## 2016-01-17 DIAGNOSIS — N2581 Secondary hyperparathyroidism of renal origin: Secondary | ICD-10-CM | POA: Diagnosis not present

## 2016-01-17 DIAGNOSIS — E440 Moderate protein-calorie malnutrition: Secondary | ICD-10-CM | POA: Diagnosis not present

## 2016-01-17 DIAGNOSIS — E119 Type 2 diabetes mellitus without complications: Secondary | ICD-10-CM | POA: Diagnosis not present

## 2016-01-17 DIAGNOSIS — D509 Iron deficiency anemia, unspecified: Secondary | ICD-10-CM | POA: Diagnosis not present

## 2016-01-17 DIAGNOSIS — N2589 Other disorders resulting from impaired renal tubular function: Secondary | ICD-10-CM | POA: Diagnosis not present

## 2016-01-17 DIAGNOSIS — E784 Other hyperlipidemia: Secondary | ICD-10-CM | POA: Diagnosis not present

## 2016-01-17 DIAGNOSIS — N186 End stage renal disease: Secondary | ICD-10-CM | POA: Diagnosis not present

## 2016-01-18 DIAGNOSIS — D509 Iron deficiency anemia, unspecified: Secondary | ICD-10-CM | POA: Diagnosis not present

## 2016-01-18 DIAGNOSIS — Z992 Dependence on renal dialysis: Secondary | ICD-10-CM | POA: Diagnosis not present

## 2016-01-18 DIAGNOSIS — D513 Other dietary vitamin B12 deficiency anemia: Secondary | ICD-10-CM | POA: Diagnosis not present

## 2016-01-18 DIAGNOSIS — N186 End stage renal disease: Secondary | ICD-10-CM | POA: Diagnosis not present

## 2016-01-18 DIAGNOSIS — E1122 Type 2 diabetes mellitus with diabetic chronic kidney disease: Secondary | ICD-10-CM | POA: Diagnosis not present

## 2016-01-18 DIAGNOSIS — E784 Other hyperlipidemia: Secondary | ICD-10-CM | POA: Diagnosis not present

## 2016-01-18 DIAGNOSIS — E440 Moderate protein-calorie malnutrition: Secondary | ICD-10-CM | POA: Diagnosis not present

## 2016-01-18 DIAGNOSIS — N2581 Secondary hyperparathyroidism of renal origin: Secondary | ICD-10-CM | POA: Diagnosis not present

## 2016-01-18 DIAGNOSIS — E119 Type 2 diabetes mellitus without complications: Secondary | ICD-10-CM | POA: Diagnosis not present

## 2016-01-18 DIAGNOSIS — N2589 Other disorders resulting from impaired renal tubular function: Secondary | ICD-10-CM | POA: Diagnosis not present

## 2016-01-19 DIAGNOSIS — N186 End stage renal disease: Secondary | ICD-10-CM | POA: Diagnosis not present

## 2016-01-20 DIAGNOSIS — N186 End stage renal disease: Secondary | ICD-10-CM | POA: Diagnosis not present

## 2016-01-21 DIAGNOSIS — N186 End stage renal disease: Secondary | ICD-10-CM | POA: Diagnosis not present

## 2016-01-21 DIAGNOSIS — D631 Anemia in chronic kidney disease: Secondary | ICD-10-CM | POA: Diagnosis not present

## 2016-01-21 DIAGNOSIS — Z79899 Other long term (current) drug therapy: Secondary | ICD-10-CM | POA: Diagnosis not present

## 2016-01-21 DIAGNOSIS — R8299 Other abnormal findings in urine: Secondary | ICD-10-CM | POA: Diagnosis not present

## 2016-01-21 DIAGNOSIS — E440 Moderate protein-calorie malnutrition: Secondary | ICD-10-CM | POA: Diagnosis not present

## 2016-01-21 DIAGNOSIS — D509 Iron deficiency anemia, unspecified: Secondary | ICD-10-CM | POA: Diagnosis not present

## 2016-01-21 DIAGNOSIS — R17 Unspecified jaundice: Secondary | ICD-10-CM | POA: Diagnosis not present

## 2016-01-24 DIAGNOSIS — H3561 Retinal hemorrhage, right eye: Secondary | ICD-10-CM | POA: Diagnosis not present

## 2016-01-24 DIAGNOSIS — H3562 Retinal hemorrhage, left eye: Secondary | ICD-10-CM | POA: Diagnosis not present

## 2016-01-24 DIAGNOSIS — H35031 Hypertensive retinopathy, right eye: Secondary | ICD-10-CM | POA: Diagnosis not present

## 2016-01-24 DIAGNOSIS — E440 Moderate protein-calorie malnutrition: Secondary | ICD-10-CM | POA: Diagnosis not present

## 2016-01-24 DIAGNOSIS — D509 Iron deficiency anemia, unspecified: Secondary | ICD-10-CM | POA: Diagnosis not present

## 2016-01-24 DIAGNOSIS — H43813 Vitreous degeneration, bilateral: Secondary | ICD-10-CM | POA: Diagnosis not present

## 2016-01-24 DIAGNOSIS — N186 End stage renal disease: Secondary | ICD-10-CM | POA: Diagnosis not present

## 2016-01-24 DIAGNOSIS — Z79899 Other long term (current) drug therapy: Secondary | ICD-10-CM | POA: Diagnosis not present

## 2016-01-24 DIAGNOSIS — D631 Anemia in chronic kidney disease: Secondary | ICD-10-CM | POA: Diagnosis not present

## 2016-01-24 DIAGNOSIS — R8299 Other abnormal findings in urine: Secondary | ICD-10-CM | POA: Diagnosis not present

## 2016-01-24 DIAGNOSIS — R17 Unspecified jaundice: Secondary | ICD-10-CM | POA: Diagnosis not present

## 2016-01-24 DIAGNOSIS — H40023 Open angle with borderline findings, high risk, bilateral: Secondary | ICD-10-CM | POA: Diagnosis not present

## 2016-01-25 DIAGNOSIS — N186 End stage renal disease: Secondary | ICD-10-CM | POA: Diagnosis not present

## 2016-01-25 DIAGNOSIS — D509 Iron deficiency anemia, unspecified: Secondary | ICD-10-CM | POA: Diagnosis not present

## 2016-01-25 DIAGNOSIS — R8299 Other abnormal findings in urine: Secondary | ICD-10-CM | POA: Diagnosis not present

## 2016-01-25 DIAGNOSIS — R17 Unspecified jaundice: Secondary | ICD-10-CM | POA: Diagnosis not present

## 2016-01-25 DIAGNOSIS — Z79899 Other long term (current) drug therapy: Secondary | ICD-10-CM | POA: Diagnosis not present

## 2016-01-25 DIAGNOSIS — E440 Moderate protein-calorie malnutrition: Secondary | ICD-10-CM | POA: Diagnosis not present

## 2016-01-25 DIAGNOSIS — D631 Anemia in chronic kidney disease: Secondary | ICD-10-CM | POA: Diagnosis not present

## 2016-01-26 DIAGNOSIS — D509 Iron deficiency anemia, unspecified: Secondary | ICD-10-CM | POA: Diagnosis not present

## 2016-01-26 DIAGNOSIS — Z79899 Other long term (current) drug therapy: Secondary | ICD-10-CM | POA: Diagnosis not present

## 2016-01-26 DIAGNOSIS — N186 End stage renal disease: Secondary | ICD-10-CM | POA: Diagnosis not present

## 2016-01-26 DIAGNOSIS — D631 Anemia in chronic kidney disease: Secondary | ICD-10-CM | POA: Diagnosis not present

## 2016-01-26 DIAGNOSIS — R17 Unspecified jaundice: Secondary | ICD-10-CM | POA: Diagnosis not present

## 2016-01-26 DIAGNOSIS — E440 Moderate protein-calorie malnutrition: Secondary | ICD-10-CM | POA: Diagnosis not present

## 2016-01-26 DIAGNOSIS — R8299 Other abnormal findings in urine: Secondary | ICD-10-CM | POA: Diagnosis not present

## 2016-01-27 DIAGNOSIS — N186 End stage renal disease: Secondary | ICD-10-CM | POA: Diagnosis not present

## 2016-01-27 DIAGNOSIS — E440 Moderate protein-calorie malnutrition: Secondary | ICD-10-CM | POA: Diagnosis not present

## 2016-01-27 DIAGNOSIS — D509 Iron deficiency anemia, unspecified: Secondary | ICD-10-CM | POA: Diagnosis not present

## 2016-01-27 DIAGNOSIS — R17 Unspecified jaundice: Secondary | ICD-10-CM | POA: Diagnosis not present

## 2016-01-27 DIAGNOSIS — D631 Anemia in chronic kidney disease: Secondary | ICD-10-CM | POA: Diagnosis not present

## 2016-01-27 DIAGNOSIS — R8299 Other abnormal findings in urine: Secondary | ICD-10-CM | POA: Diagnosis not present

## 2016-01-27 DIAGNOSIS — Z79899 Other long term (current) drug therapy: Secondary | ICD-10-CM | POA: Diagnosis not present

## 2016-01-28 DIAGNOSIS — N186 End stage renal disease: Secondary | ICD-10-CM | POA: Diagnosis not present

## 2016-01-28 DIAGNOSIS — Z23 Encounter for immunization: Secondary | ICD-10-CM | POA: Diagnosis not present

## 2016-01-28 DIAGNOSIS — D509 Iron deficiency anemia, unspecified: Secondary | ICD-10-CM | POA: Diagnosis not present

## 2016-01-28 DIAGNOSIS — D631 Anemia in chronic kidney disease: Secondary | ICD-10-CM | POA: Diagnosis not present

## 2016-01-29 DIAGNOSIS — N186 End stage renal disease: Secondary | ICD-10-CM | POA: Diagnosis not present

## 2016-01-29 DIAGNOSIS — Z23 Encounter for immunization: Secondary | ICD-10-CM | POA: Diagnosis not present

## 2016-01-29 DIAGNOSIS — D509 Iron deficiency anemia, unspecified: Secondary | ICD-10-CM | POA: Diagnosis not present

## 2016-01-29 DIAGNOSIS — D631 Anemia in chronic kidney disease: Secondary | ICD-10-CM | POA: Diagnosis not present

## 2016-01-30 DIAGNOSIS — N186 End stage renal disease: Secondary | ICD-10-CM | POA: Diagnosis not present

## 2016-01-30 DIAGNOSIS — D631 Anemia in chronic kidney disease: Secondary | ICD-10-CM | POA: Diagnosis not present

## 2016-01-30 DIAGNOSIS — D509 Iron deficiency anemia, unspecified: Secondary | ICD-10-CM | POA: Diagnosis not present

## 2016-01-30 DIAGNOSIS — Z23 Encounter for immunization: Secondary | ICD-10-CM | POA: Diagnosis not present

## 2016-01-31 ENCOUNTER — Other Ambulatory Visit: Payer: Self-pay

## 2016-01-31 DIAGNOSIS — D509 Iron deficiency anemia, unspecified: Secondary | ICD-10-CM | POA: Diagnosis not present

## 2016-01-31 DIAGNOSIS — Z23 Encounter for immunization: Secondary | ICD-10-CM | POA: Diagnosis not present

## 2016-01-31 DIAGNOSIS — D631 Anemia in chronic kidney disease: Secondary | ICD-10-CM | POA: Diagnosis not present

## 2016-01-31 DIAGNOSIS — N186 End stage renal disease: Secondary | ICD-10-CM | POA: Diagnosis not present

## 2016-01-31 DIAGNOSIS — Z1231 Encounter for screening mammogram for malignant neoplasm of breast: Secondary | ICD-10-CM

## 2016-02-01 DIAGNOSIS — N186 End stage renal disease: Secondary | ICD-10-CM | POA: Diagnosis not present

## 2016-02-01 DIAGNOSIS — D509 Iron deficiency anemia, unspecified: Secondary | ICD-10-CM | POA: Diagnosis not present

## 2016-02-01 DIAGNOSIS — D631 Anemia in chronic kidney disease: Secondary | ICD-10-CM | POA: Diagnosis not present

## 2016-02-01 DIAGNOSIS — Z23 Encounter for immunization: Secondary | ICD-10-CM | POA: Diagnosis not present

## 2016-02-02 DIAGNOSIS — N186 End stage renal disease: Secondary | ICD-10-CM | POA: Diagnosis not present

## 2016-02-02 DIAGNOSIS — Z23 Encounter for immunization: Secondary | ICD-10-CM | POA: Diagnosis not present

## 2016-02-02 DIAGNOSIS — D631 Anemia in chronic kidney disease: Secondary | ICD-10-CM | POA: Diagnosis not present

## 2016-02-02 DIAGNOSIS — D509 Iron deficiency anemia, unspecified: Secondary | ICD-10-CM | POA: Diagnosis not present

## 2016-02-03 DIAGNOSIS — D631 Anemia in chronic kidney disease: Secondary | ICD-10-CM | POA: Diagnosis not present

## 2016-02-03 DIAGNOSIS — N186 End stage renal disease: Secondary | ICD-10-CM | POA: Diagnosis not present

## 2016-02-03 DIAGNOSIS — D509 Iron deficiency anemia, unspecified: Secondary | ICD-10-CM | POA: Diagnosis not present

## 2016-02-03 DIAGNOSIS — Z23 Encounter for immunization: Secondary | ICD-10-CM | POA: Diagnosis not present

## 2016-02-04 DIAGNOSIS — D509 Iron deficiency anemia, unspecified: Secondary | ICD-10-CM | POA: Diagnosis not present

## 2016-02-04 DIAGNOSIS — D631 Anemia in chronic kidney disease: Secondary | ICD-10-CM | POA: Diagnosis not present

## 2016-02-04 DIAGNOSIS — Z23 Encounter for immunization: Secondary | ICD-10-CM | POA: Diagnosis not present

## 2016-02-04 DIAGNOSIS — N186 End stage renal disease: Secondary | ICD-10-CM | POA: Diagnosis not present

## 2016-02-05 DIAGNOSIS — N186 End stage renal disease: Secondary | ICD-10-CM | POA: Diagnosis not present

## 2016-02-05 DIAGNOSIS — D631 Anemia in chronic kidney disease: Secondary | ICD-10-CM | POA: Diagnosis not present

## 2016-02-05 DIAGNOSIS — Z23 Encounter for immunization: Secondary | ICD-10-CM | POA: Diagnosis not present

## 2016-02-05 DIAGNOSIS — D509 Iron deficiency anemia, unspecified: Secondary | ICD-10-CM | POA: Diagnosis not present

## 2016-02-06 DIAGNOSIS — Z23 Encounter for immunization: Secondary | ICD-10-CM | POA: Diagnosis not present

## 2016-02-06 DIAGNOSIS — D631 Anemia in chronic kidney disease: Secondary | ICD-10-CM | POA: Diagnosis not present

## 2016-02-06 DIAGNOSIS — D509 Iron deficiency anemia, unspecified: Secondary | ICD-10-CM | POA: Diagnosis not present

## 2016-02-06 DIAGNOSIS — N186 End stage renal disease: Secondary | ICD-10-CM | POA: Diagnosis not present

## 2016-02-07 DIAGNOSIS — D631 Anemia in chronic kidney disease: Secondary | ICD-10-CM | POA: Diagnosis not present

## 2016-02-07 DIAGNOSIS — D509 Iron deficiency anemia, unspecified: Secondary | ICD-10-CM | POA: Diagnosis not present

## 2016-02-07 DIAGNOSIS — Z23 Encounter for immunization: Secondary | ICD-10-CM | POA: Diagnosis not present

## 2016-02-07 DIAGNOSIS — N186 End stage renal disease: Secondary | ICD-10-CM | POA: Diagnosis not present

## 2016-02-08 DIAGNOSIS — D509 Iron deficiency anemia, unspecified: Secondary | ICD-10-CM | POA: Diagnosis not present

## 2016-02-08 DIAGNOSIS — D631 Anemia in chronic kidney disease: Secondary | ICD-10-CM | POA: Diagnosis not present

## 2016-02-08 DIAGNOSIS — N186 End stage renal disease: Secondary | ICD-10-CM | POA: Diagnosis not present

## 2016-02-08 DIAGNOSIS — Z23 Encounter for immunization: Secondary | ICD-10-CM | POA: Diagnosis not present

## 2016-02-09 DIAGNOSIS — D509 Iron deficiency anemia, unspecified: Secondary | ICD-10-CM | POA: Diagnosis not present

## 2016-02-09 DIAGNOSIS — D631 Anemia in chronic kidney disease: Secondary | ICD-10-CM | POA: Diagnosis not present

## 2016-02-09 DIAGNOSIS — Z23 Encounter for immunization: Secondary | ICD-10-CM | POA: Diagnosis not present

## 2016-02-09 DIAGNOSIS — N186 End stage renal disease: Secondary | ICD-10-CM | POA: Diagnosis not present

## 2016-02-10 DIAGNOSIS — N186 End stage renal disease: Secondary | ICD-10-CM | POA: Diagnosis not present

## 2016-02-10 DIAGNOSIS — D631 Anemia in chronic kidney disease: Secondary | ICD-10-CM | POA: Diagnosis not present

## 2016-02-10 DIAGNOSIS — D509 Iron deficiency anemia, unspecified: Secondary | ICD-10-CM | POA: Diagnosis not present

## 2016-02-10 DIAGNOSIS — Z23 Encounter for immunization: Secondary | ICD-10-CM | POA: Diagnosis not present

## 2016-02-11 ENCOUNTER — Ambulatory Visit (INDEPENDENT_AMBULATORY_CARE_PROVIDER_SITE_OTHER): Payer: Medicare Other | Admitting: Internal Medicine

## 2016-02-11 ENCOUNTER — Encounter: Payer: Self-pay | Admitting: Internal Medicine

## 2016-02-11 VITALS — BP 116/60 | HR 70 | Temp 98.0°F | Resp 16 | Ht 62.0 in | Wt 136.0 lb

## 2016-02-11 DIAGNOSIS — I10 Essential (primary) hypertension: Secondary | ICD-10-CM | POA: Diagnosis not present

## 2016-02-11 DIAGNOSIS — M159 Polyosteoarthritis, unspecified: Secondary | ICD-10-CM | POA: Diagnosis not present

## 2016-02-11 DIAGNOSIS — K2100 Gastro-esophageal reflux disease with esophagitis, without bleeding: Secondary | ICD-10-CM

## 2016-02-11 DIAGNOSIS — E559 Vitamin D deficiency, unspecified: Secondary | ICD-10-CM | POA: Diagnosis not present

## 2016-02-11 DIAGNOSIS — N186 End stage renal disease: Secondary | ICD-10-CM | POA: Diagnosis not present

## 2016-02-11 DIAGNOSIS — M8580 Other specified disorders of bone density and structure, unspecified site: Secondary | ICD-10-CM | POA: Diagnosis not present

## 2016-02-11 DIAGNOSIS — K862 Cyst of pancreas: Secondary | ICD-10-CM | POA: Diagnosis not present

## 2016-02-11 DIAGNOSIS — R6889 Other general symptoms and signs: Secondary | ICD-10-CM | POA: Diagnosis not present

## 2016-02-11 DIAGNOSIS — D631 Anemia in chronic kidney disease: Secondary | ICD-10-CM | POA: Diagnosis not present

## 2016-02-11 DIAGNOSIS — N2581 Secondary hyperparathyroidism of renal origin: Secondary | ICD-10-CM

## 2016-02-11 DIAGNOSIS — Z6825 Body mass index (BMI) 25.0-25.9, adult: Secondary | ICD-10-CM

## 2016-02-11 DIAGNOSIS — Z0001 Encounter for general adult medical examination with abnormal findings: Secondary | ICD-10-CM

## 2016-02-11 DIAGNOSIS — Z79899 Other long term (current) drug therapy: Secondary | ICD-10-CM

## 2016-02-11 DIAGNOSIS — M1611 Unilateral primary osteoarthritis, right hip: Secondary | ICD-10-CM

## 2016-02-11 DIAGNOSIS — M109 Gout, unspecified: Secondary | ICD-10-CM

## 2016-02-11 DIAGNOSIS — E782 Mixed hyperlipidemia: Secondary | ICD-10-CM

## 2016-02-11 DIAGNOSIS — Z23 Encounter for immunization: Secondary | ICD-10-CM | POA: Diagnosis not present

## 2016-02-11 DIAGNOSIS — D638 Anemia in other chronic diseases classified elsewhere: Secondary | ICD-10-CM | POA: Diagnosis not present

## 2016-02-11 DIAGNOSIS — D509 Iron deficiency anemia, unspecified: Secondary | ICD-10-CM | POA: Diagnosis not present

## 2016-02-11 DIAGNOSIS — Z0000 Encounter for general adult medical examination without abnormal findings: Secondary | ICD-10-CM

## 2016-02-11 MED ORDER — POLYETHYLENE GLYCOL 3350 17 G PO PACK: 17.0000 g | PACK | Freq: Every day | ORAL | Status: DC

## 2016-02-11 NOTE — Progress Notes (Signed)
MEDICARE ANNUAL WELLNESS VISIT AND FOLLOW UP  Assessment:    1. Medicare annual wellness exam -due next year  2. Essential hypertension -cont current meds -monitor at home -DASH diet -exercise  3. GERD -cont diet and exercise -medication prn  4. Secondary hyperparathyroidism (CKD) -Cont Vit D def  5. DJD -cont meds prn -avoid NSAIDs  6. Osteopenia -cont vit D  7. Primary osteoarthritis of right hip -heat as needed  8. ESRD (end stage renal disease) ([place]) -on dialysis at home  9. Anemia of chronic Renal Dz -monitor with CBC  10. Pancreatic cyst -monitor with pain  11. Gout without tophus, unspecified cause, unspecified chronicity, unspecified site -cont allopurinol  12. Vitamin D deficiency -cont supplement  13. Hyperlipidemia -cont diet and exercise  14. Medication management -cont lab monitoring.  -just had labs done by nephrology  15. BMI 25.0-25.9,adult -cont diet and exercise  Over 30 minutes of exam, counseling, chart review, and critical decision making was performed  Plan:   During the course of the visit the patient was educated and counseled about appropriate screening and preventive services including:    Pneumococcal vaccine   Influenza vaccine  Td vaccine  Prevnar 13  Screening electrocardiogram  Screening mammography  Bone densitometry screening  Colorectal cancer screening  Diabetes screening  Glaucoma screening  Nutrition counseling   Advanced directives: given info/requested copies  Conditions/risks identified: Diabetes is at goal, ACE/ARB therapy: No, Reason not on Ace Inhibitor/ARB therapy:  on dialysis Urinary Incontinence is not an issue: discussed non pharmacology and pharmacology options.  Fall risk: low- discussed PT, home fall assessment, medications.    Subjective:   Eileen Perez is a 75 y.o. female who presents for Medicare Annual Wellness Visit and 3 month follow up on hypertension, prediabetes,  hyperlipidemia, vitamin D def.  Date of last medicare wellness visit is unknown.   Her blood pressure has been controlled at home, today their BP is BP: 116/60 mmHg She does not workout. She denies chest pain, shortness of breath, dizziness.  With dialysis starting she has not been able to get out as much.  She reports that she is hoping to get back out and start doing it again.     She is not on cholesterol medication and denies myalgias. Her cholesterol is at goal. The cholesterol last visit was:   Lab Results  Component Value Date   CHOL 238* 10/29/2015   HDL 98 10/29/2015   LDLCALC 129 10/29/2015   TRIG 56 10/29/2015   CHOLHDL 2.4 10/29/2015   She has been working on diet and exercise for prediabetes, and denies foot ulcerations, hyperglycemia, hypoglycemia , increased appetite, nausea, paresthesia of the feet, polydipsia, polyuria, visual disturbances, vomiting and weight loss. Last A1C in the office was:  Lab Results  Component Value Date   HGBA1C 5.7* 10/29/2015   Last GFR NonAA   Lab Results  Component Value Date   GFRNONAA 5* 12/10/2015   AA  Lab Results  Component Value Date   GFRAA 5* 12/10/2015   Patient is on Vitamin D supplement. Lab Results  Component Value Date   VD25OH 31 10/29/2015       Medication Review Current Outpatient Prescriptions on File Prior to Visit  Medication Sig Dispense Refill  . allopurinol (ZYLOPRIM) 300 MG tablet Take 1/2 to 1 tablet by  mouth daily as directed 90 tablet 1  . amLODipine (NORVASC) 5 MG tablet Take 2.5 mg by mouth at bedtime.     . calcitRIOL (  ROCALTROL) 0.25 MCG capsule     . CALCIUM PO Take by mouth.    . Cholecalciferol (VITAMIN D-3 PO) Take 1 tablet by mouth daily.    . cloNIDine (CATAPRES) 0.1 MG tablet Take 1 tablet (0.1 mg total) by mouth 2 (two) times daily. 180 tablet 2  . docusate sodium (COLACE) 100 MG capsule Take 100 mg by mouth 2 (two) times daily.    . Ferrous Sulfate (SLOW FE PO) Take 45 mg by mouth 2  (two) times daily.    . Flaxseed, Linseed, (FLAXSEED OIL PO) Take 1 tablet by mouth daily.    Marland Kitchen glucose blood (ACCU-CHEK AVIVA PLUS) test strip Test once daily 100 each 6  . hydrALAZINE (APRESOLINE) 10 MG tablet Take 2 tablets by mouth two times daily 360 tablet 1  . labetalol (NORMODYNE) 200 MG tablet Take 1 tablet by mouth two  times daily 180 tablet 1  . Multiple Vitamin (MULTIVITAMIN WITH MINERALS) TABS tablet Take 1 tablet by mouth daily.    Marland Kitchen omega-3 acid ethyl esters (LOVAZA) 1 G capsule Take 1 g by mouth 2 (two) times daily.    . Red Yeast Rice Extract (RED YEAST RICE PO) Take 1 tablet by mouth daily.    Marland Kitchen zinc gluconate 50 MG tablet Take 50 mg by mouth every morning.    . [DISCONTINUED] diltiazem (CARDIZEM) 120 MG tablet Take 120 mg by mouth 2 (two) times daily.     No current facility-administered medications on file prior to visit.    Current Problems (verified) Patient Active Problem List   Diagnosis Date Noted  . Medicare annual wellness exam 10/31/2015  . BMI 25.0-25.9,adult 10/31/2015  . ESRD (end stage renal disease) ([place]) 10/31/2015  . OA (osteoarthritis) of hip 05/26/2015  . Secondary hyperparathyroidism (CKD) 04/16/2014  . Vitamin D deficiency 04/16/2014  . Hyperlipidemia 04/16/2014  . Medication management 04/16/2014  . Anemia of chronic Renal Dz   . Pancreatic cyst   . GERD   . Gout   . Osteopenia   . Hypertension 06/10/2013  . DJD 06/09/2013    Screening Tests Immunization History  Administered Date(s) Administered  . DT 02/05/2014  . Influenza, High Dose Seasonal PF 09/25/2013  . Influenza-Unspecified 08/11/2015  . Pneumococcal Polysaccharide-23 11/20/2009  . Td 11/21/2003  . Zoster 11/20/2005    Preventative care: Last colonoscopy: 2014 Last mammogram: 2016 DEXA:2016  Prior vaccinations: TD or Tdap: Thinks it was done recently before dialysis started  Influenza: 2016  Pneumococcal: 2011 Prevnar13: Declines today Shingles/Zostavax:  2007  Names of Other Physician/Practitioners you currently use: 1. [facility]- here for primary care 2. Dr. Herbert Deaner, eye doctor, last visit 2017 3. Does not see one, dentist, last visit 2015 Patient Care Team: Unk Pinto, MD as PCP - General (Internal Medicine) Gaynelle Arabian, MD as Consulting Physician (Orthopedic Surgery) Corliss Parish, MD as Consulting Physician (Nephrology) Monna Fam, MD as Consulting Physician (Ophthalmology) Laurence Spates, MD as Consulting Physician (Gastroenterology) Lelon Perla, MD as Consulting Physician (Cardiology) Kathie Rhodes, MD as Consulting Physician (Urology)  Past Surgical History  Procedure Laterality Date  . Pancreatic cyst excision  1999  . Joint replacement  2012    left knee  . Eye surgery Left     cataract extraction with IOL  . Cholecystectomy    . Total knee arthroplasty Right 06/09/2013    Procedure: RIGHT TOTAL KNEE ARTHROPLASTY;  Surgeon: Gearlean Alf, MD;  Location: [facility];  Service: Orthopedics;  Laterality:  Right;  . Colonoscopy  03/21/12    Next one in 2018  . Left knee replacement     . 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 Hypertension Mother   . Cancer Mother     Colon with METS  . Diabetes Mother   . Cancer Father     Prostate and Throat   Social History  Substance Use Topics  . Smoking status: Never Smoker   . Smokeless tobacco: Never Used  . Alcohol Use: No    MEDICARE WELLNESS OBJECTIVES: Tobacco use: She does not smoke.  Patient is not a former smoker. If yes, counseling given Alcohol Current alcohol use: none Osteoporosis: postmenopausal estrogen deficiency, History of fracture in the past year: no Fall risk: Low Risk Hearing: normal Visual acuity:  normal,  does perform annual eye exam Diet: well balanced Physical activity: Current Exercise Habits: Home exercise routine, Type of exercise: walking, Time (Minutes): 60, Frequency (Times/Week): 4, Weekly Exercise (Minutes/Week): 240, Intensity: Moderate Cardiac risk factors: Cardiac Risk Factors include: advanced age (>40men, >45 women);microalbuminuria;hypertension Depression/mood screen:   Depression screen [facility] 2/9 02/11/2016  Decreased Interest 0  Down, Depressed, Hopeless 0  PHQ - 2 Score 0    ADLs:  In your present state of health, do you have any difficulty performing the following activities: 02/11/2016 10/28/2015  Hearing? N N  Vision? N N  Difficulty concentrating or making decisions? N N  Walking or climbing stairs? N N  Dressing or bathing? N N  Doing errands, shopping? N -  Preparing Food and eating ? N -  Using the Toilet? N -  In the past six months, have you accidently leaked urine? N -  Do you have problems with loss of bowel control? N -  Managing your Medications? N -  Managing your Finances? N -  Housekeeping or managing your Housekeeping? N -     Cognitive Testing  Alert? Yes  Normal Appearance?Yes  Oriented to person? Yes  Place? Yes   Time? Yes  Recall of three objects?  Yes  Can perform simple calculations? Yes  Displays appropriate judgment?Yes  Can read the correct time from a watch face?Yes  EOL planning: Does patient have an advance directive?: Yes Type of Advance Directive: Healthcare Power of Attorney, Living will Does patient want to make changes to advanced directive?: No - Patient declined Copy of advanced directive(s) in chart?: No - copy requested   Objective:   Today's Vitals   02/11/16 1002  BP: 116/60  Pulse: 70  Temp: 98 F (36.7 C)  TempSrc: Temporal  Resp: 16  Height: 5\' 2"  (1.575 m)  Weight: 136 lb (61.689 kg)   Body mass index is 24.87 kg/(m^2).  General appearance: alert, no distress, WD/WN,  female HEENT:  normocephalic, sclerae anicteric, TMs pearly, nares patent, no discharge or erythema, pharynx normal Oral cavity: MMM, no lesions Neck: supple, no lymphadenopathy, no thyromegaly, no masses Heart: RRR, normal S1, S2, no murmurs Lungs: CTA bilaterally, no wheezes, rhonchi, or rales Abdomen: +bs, soft, non tender, non distended, no masses, no hepatomegaly, no splenomegaly Musculoskeletal: nontender, no swelling, no obvious deformity Extremities: no edema, no cyanosis, no clubbing Pulses: 2+ symmetric, upper and lower extremities, normal cap refill Neurological: alert, oriented x 3, CN2-12 intact, strength normal upper extremities and  lower extremities, sensation normal throughout, DTRs 2+ throughout, no cerebellar signs, gait normal Psychiatric: normal affect, behavior normal, pleasant  Breast: defer Gyn: defer Rectal: defer   Medicare Attestation I have personally reviewed: The patient's medical and social history Their use of alcohol, tobacco or illicit drugs Their current medications and supplements The patient's functional ability including ADLs,fall risks, home safety risks, cognitive, and hearing and visual impairment Diet and physical activities Evidence for depression or mood disorders  The patient's weight, height, BMI, and visual acuity have been recorded in the chart.  I have made referrals, counseling, and provided education to the patient based on review of the above and I have provided the patient with a written personalized care plan for preventive services.     Starlyn Skeans, PA-C   02/11/2016

## 2016-02-12 DIAGNOSIS — N186 End stage renal disease: Secondary | ICD-10-CM | POA: Diagnosis not present

## 2016-02-12 DIAGNOSIS — D509 Iron deficiency anemia, unspecified: Secondary | ICD-10-CM | POA: Diagnosis not present

## 2016-02-12 DIAGNOSIS — Z23 Encounter for immunization: Secondary | ICD-10-CM | POA: Diagnosis not present

## 2016-02-12 DIAGNOSIS — D631 Anemia in chronic kidney disease: Secondary | ICD-10-CM | POA: Diagnosis not present

## 2016-02-13 DIAGNOSIS — N186 End stage renal disease: Secondary | ICD-10-CM | POA: Diagnosis not present

## 2016-02-13 DIAGNOSIS — D509 Iron deficiency anemia, unspecified: Secondary | ICD-10-CM | POA: Diagnosis not present

## 2016-02-13 DIAGNOSIS — D631 Anemia in chronic kidney disease: Secondary | ICD-10-CM | POA: Diagnosis not present

## 2016-02-13 DIAGNOSIS — Z23 Encounter for immunization: Secondary | ICD-10-CM | POA: Diagnosis not present

## 2016-02-14 DIAGNOSIS — Z23 Encounter for immunization: Secondary | ICD-10-CM | POA: Diagnosis not present

## 2016-02-14 DIAGNOSIS — N186 End stage renal disease: Secondary | ICD-10-CM | POA: Diagnosis not present

## 2016-02-14 DIAGNOSIS — D509 Iron deficiency anemia, unspecified: Secondary | ICD-10-CM | POA: Diagnosis not present

## 2016-02-14 DIAGNOSIS — D631 Anemia in chronic kidney disease: Secondary | ICD-10-CM | POA: Diagnosis not present

## 2016-02-15 ENCOUNTER — Other Ambulatory Visit: Payer: Self-pay | Admitting: Internal Medicine

## 2016-02-15 ENCOUNTER — Other Ambulatory Visit: Payer: Self-pay | Admitting: Cardiology

## 2016-02-15 DIAGNOSIS — Z23 Encounter for immunization: Secondary | ICD-10-CM | POA: Diagnosis not present

## 2016-02-15 DIAGNOSIS — D509 Iron deficiency anemia, unspecified: Secondary | ICD-10-CM | POA: Diagnosis not present

## 2016-02-15 DIAGNOSIS — D631 Anemia in chronic kidney disease: Secondary | ICD-10-CM | POA: Diagnosis not present

## 2016-02-15 DIAGNOSIS — N186 End stage renal disease: Secondary | ICD-10-CM | POA: Diagnosis not present

## 2016-02-15 NOTE — Telephone Encounter (Signed)
REFILL 

## 2016-02-16 DIAGNOSIS — N186 End stage renal disease: Secondary | ICD-10-CM | POA: Diagnosis not present

## 2016-02-16 DIAGNOSIS — D509 Iron deficiency anemia, unspecified: Secondary | ICD-10-CM | POA: Diagnosis not present

## 2016-02-16 DIAGNOSIS — D631 Anemia in chronic kidney disease: Secondary | ICD-10-CM | POA: Diagnosis not present

## 2016-02-16 DIAGNOSIS — Z23 Encounter for immunization: Secondary | ICD-10-CM | POA: Diagnosis not present

## 2016-02-17 DIAGNOSIS — N186 End stage renal disease: Secondary | ICD-10-CM | POA: Diagnosis not present

## 2016-02-17 DIAGNOSIS — D631 Anemia in chronic kidney disease: Secondary | ICD-10-CM | POA: Diagnosis not present

## 2016-02-17 DIAGNOSIS — Z23 Encounter for immunization: Secondary | ICD-10-CM | POA: Diagnosis not present

## 2016-02-17 DIAGNOSIS — D509 Iron deficiency anemia, unspecified: Secondary | ICD-10-CM | POA: Diagnosis not present

## 2016-02-18 DIAGNOSIS — Z992 Dependence on renal dialysis: Secondary | ICD-10-CM | POA: Diagnosis not present

## 2016-02-18 DIAGNOSIS — D631 Anemia in chronic kidney disease: Secondary | ICD-10-CM | POA: Diagnosis not present

## 2016-02-18 DIAGNOSIS — Z23 Encounter for immunization: Secondary | ICD-10-CM | POA: Diagnosis not present

## 2016-02-18 DIAGNOSIS — D509 Iron deficiency anemia, unspecified: Secondary | ICD-10-CM | POA: Diagnosis not present

## 2016-02-18 DIAGNOSIS — N186 End stage renal disease: Secondary | ICD-10-CM | POA: Diagnosis not present

## 2016-02-18 DIAGNOSIS — E1122 Type 2 diabetes mellitus with diabetic chronic kidney disease: Secondary | ICD-10-CM | POA: Diagnosis not present

## 2016-02-19 DIAGNOSIS — N2589 Other disorders resulting from impaired renal tubular function: Secondary | ICD-10-CM | POA: Diagnosis not present

## 2016-02-19 DIAGNOSIS — D631 Anemia in chronic kidney disease: Secondary | ICD-10-CM | POA: Diagnosis not present

## 2016-02-19 DIAGNOSIS — N186 End stage renal disease: Secondary | ICD-10-CM | POA: Diagnosis not present

## 2016-02-19 DIAGNOSIS — R8299 Other abnormal findings in urine: Secondary | ICD-10-CM | POA: Diagnosis not present

## 2016-02-19 DIAGNOSIS — Z23 Encounter for immunization: Secondary | ICD-10-CM | POA: Diagnosis not present

## 2016-02-19 DIAGNOSIS — E119 Type 2 diabetes mellitus without complications: Secondary | ICD-10-CM | POA: Diagnosis not present

## 2016-02-19 DIAGNOSIS — D509 Iron deficiency anemia, unspecified: Secondary | ICD-10-CM | POA: Diagnosis not present

## 2016-02-19 DIAGNOSIS — N2581 Secondary hyperparathyroidism of renal origin: Secondary | ICD-10-CM | POA: Diagnosis not present

## 2016-02-19 DIAGNOSIS — E784 Other hyperlipidemia: Secondary | ICD-10-CM | POA: Diagnosis not present

## 2016-02-20 DIAGNOSIS — E784 Other hyperlipidemia: Secondary | ICD-10-CM | POA: Diagnosis not present

## 2016-02-20 DIAGNOSIS — N2589 Other disorders resulting from impaired renal tubular function: Secondary | ICD-10-CM | POA: Diagnosis not present

## 2016-02-20 DIAGNOSIS — D631 Anemia in chronic kidney disease: Secondary | ICD-10-CM | POA: Diagnosis not present

## 2016-02-20 DIAGNOSIS — Z23 Encounter for immunization: Secondary | ICD-10-CM | POA: Diagnosis not present

## 2016-02-20 DIAGNOSIS — N186 End stage renal disease: Secondary | ICD-10-CM | POA: Diagnosis not present

## 2016-02-20 DIAGNOSIS — R8299 Other abnormal findings in urine: Secondary | ICD-10-CM | POA: Diagnosis not present

## 2016-02-20 DIAGNOSIS — D509 Iron deficiency anemia, unspecified: Secondary | ICD-10-CM | POA: Diagnosis not present

## 2016-02-20 DIAGNOSIS — E119 Type 2 diabetes mellitus without complications: Secondary | ICD-10-CM | POA: Diagnosis not present

## 2016-02-20 DIAGNOSIS — N2581 Secondary hyperparathyroidism of renal origin: Secondary | ICD-10-CM | POA: Diagnosis not present

## 2016-02-21 DIAGNOSIS — D509 Iron deficiency anemia, unspecified: Secondary | ICD-10-CM | POA: Diagnosis not present

## 2016-02-21 DIAGNOSIS — E119 Type 2 diabetes mellitus without complications: Secondary | ICD-10-CM | POA: Diagnosis not present

## 2016-02-21 DIAGNOSIS — Z23 Encounter for immunization: Secondary | ICD-10-CM | POA: Diagnosis not present

## 2016-02-21 DIAGNOSIS — N186 End stage renal disease: Secondary | ICD-10-CM | POA: Diagnosis not present

## 2016-02-21 DIAGNOSIS — D631 Anemia in chronic kidney disease: Secondary | ICD-10-CM | POA: Diagnosis not present

## 2016-02-21 DIAGNOSIS — R8299 Other abnormal findings in urine: Secondary | ICD-10-CM | POA: Diagnosis not present

## 2016-02-21 DIAGNOSIS — E784 Other hyperlipidemia: Secondary | ICD-10-CM | POA: Diagnosis not present

## 2016-02-21 DIAGNOSIS — N2589 Other disorders resulting from impaired renal tubular function: Secondary | ICD-10-CM | POA: Diagnosis not present

## 2016-02-21 DIAGNOSIS — N2581 Secondary hyperparathyroidism of renal origin: Secondary | ICD-10-CM | POA: Diagnosis not present

## 2016-02-22 DIAGNOSIS — E119 Type 2 diabetes mellitus without complications: Secondary | ICD-10-CM | POA: Diagnosis not present

## 2016-02-22 DIAGNOSIS — N186 End stage renal disease: Secondary | ICD-10-CM | POA: Diagnosis not present

## 2016-02-22 DIAGNOSIS — D631 Anemia in chronic kidney disease: Secondary | ICD-10-CM | POA: Diagnosis not present

## 2016-02-22 DIAGNOSIS — N2589 Other disorders resulting from impaired renal tubular function: Secondary | ICD-10-CM | POA: Diagnosis not present

## 2016-02-22 DIAGNOSIS — N2581 Secondary hyperparathyroidism of renal origin: Secondary | ICD-10-CM | POA: Diagnosis not present

## 2016-02-22 DIAGNOSIS — Z23 Encounter for immunization: Secondary | ICD-10-CM | POA: Diagnosis not present

## 2016-02-22 DIAGNOSIS — E784 Other hyperlipidemia: Secondary | ICD-10-CM | POA: Diagnosis not present

## 2016-02-22 DIAGNOSIS — D509 Iron deficiency anemia, unspecified: Secondary | ICD-10-CM | POA: Diagnosis not present

## 2016-02-22 DIAGNOSIS — R8299 Other abnormal findings in urine: Secondary | ICD-10-CM | POA: Diagnosis not present

## 2016-02-23 DIAGNOSIS — E784 Other hyperlipidemia: Secondary | ICD-10-CM | POA: Diagnosis not present

## 2016-02-23 DIAGNOSIS — N2589 Other disorders resulting from impaired renal tubular function: Secondary | ICD-10-CM | POA: Diagnosis not present

## 2016-02-23 DIAGNOSIS — D631 Anemia in chronic kidney disease: Secondary | ICD-10-CM | POA: Diagnosis not present

## 2016-02-23 DIAGNOSIS — E119 Type 2 diabetes mellitus without complications: Secondary | ICD-10-CM | POA: Diagnosis not present

## 2016-02-23 DIAGNOSIS — N186 End stage renal disease: Secondary | ICD-10-CM | POA: Diagnosis not present

## 2016-02-23 DIAGNOSIS — R8299 Other abnormal findings in urine: Secondary | ICD-10-CM | POA: Diagnosis not present

## 2016-02-23 DIAGNOSIS — Z23 Encounter for immunization: Secondary | ICD-10-CM | POA: Diagnosis not present

## 2016-02-23 DIAGNOSIS — N2581 Secondary hyperparathyroidism of renal origin: Secondary | ICD-10-CM | POA: Diagnosis not present

## 2016-02-23 DIAGNOSIS — D509 Iron deficiency anemia, unspecified: Secondary | ICD-10-CM | POA: Diagnosis not present

## 2016-02-24 DIAGNOSIS — E784 Other hyperlipidemia: Secondary | ICD-10-CM | POA: Diagnosis not present

## 2016-02-24 DIAGNOSIS — Z23 Encounter for immunization: Secondary | ICD-10-CM | POA: Diagnosis not present

## 2016-02-24 DIAGNOSIS — N186 End stage renal disease: Secondary | ICD-10-CM | POA: Diagnosis not present

## 2016-02-24 DIAGNOSIS — N2581 Secondary hyperparathyroidism of renal origin: Secondary | ICD-10-CM | POA: Diagnosis not present

## 2016-02-24 DIAGNOSIS — D509 Iron deficiency anemia, unspecified: Secondary | ICD-10-CM | POA: Diagnosis not present

## 2016-02-24 DIAGNOSIS — E119 Type 2 diabetes mellitus without complications: Secondary | ICD-10-CM | POA: Diagnosis not present

## 2016-02-24 DIAGNOSIS — N2589 Other disorders resulting from impaired renal tubular function: Secondary | ICD-10-CM | POA: Diagnosis not present

## 2016-02-24 DIAGNOSIS — R8299 Other abnormal findings in urine: Secondary | ICD-10-CM | POA: Diagnosis not present

## 2016-02-24 DIAGNOSIS — D631 Anemia in chronic kidney disease: Secondary | ICD-10-CM | POA: Diagnosis not present

## 2016-02-25 DIAGNOSIS — Z23 Encounter for immunization: Secondary | ICD-10-CM | POA: Diagnosis not present

## 2016-02-25 DIAGNOSIS — N2581 Secondary hyperparathyroidism of renal origin: Secondary | ICD-10-CM | POA: Diagnosis not present

## 2016-02-25 DIAGNOSIS — E119 Type 2 diabetes mellitus without complications: Secondary | ICD-10-CM | POA: Diagnosis not present

## 2016-02-25 DIAGNOSIS — N2589 Other disorders resulting from impaired renal tubular function: Secondary | ICD-10-CM | POA: Diagnosis not present

## 2016-02-25 DIAGNOSIS — D509 Iron deficiency anemia, unspecified: Secondary | ICD-10-CM | POA: Diagnosis not present

## 2016-02-25 DIAGNOSIS — N186 End stage renal disease: Secondary | ICD-10-CM | POA: Diagnosis not present

## 2016-02-25 DIAGNOSIS — D631 Anemia in chronic kidney disease: Secondary | ICD-10-CM | POA: Diagnosis not present

## 2016-02-25 DIAGNOSIS — R8299 Other abnormal findings in urine: Secondary | ICD-10-CM | POA: Diagnosis not present

## 2016-02-25 DIAGNOSIS — E784 Other hyperlipidemia: Secondary | ICD-10-CM | POA: Diagnosis not present

## 2016-02-26 DIAGNOSIS — Z23 Encounter for immunization: Secondary | ICD-10-CM | POA: Diagnosis not present

## 2016-02-26 DIAGNOSIS — E119 Type 2 diabetes mellitus without complications: Secondary | ICD-10-CM | POA: Diagnosis not present

## 2016-02-26 DIAGNOSIS — E784 Other hyperlipidemia: Secondary | ICD-10-CM | POA: Diagnosis not present

## 2016-02-26 DIAGNOSIS — D509 Iron deficiency anemia, unspecified: Secondary | ICD-10-CM | POA: Diagnosis not present

## 2016-02-26 DIAGNOSIS — N2581 Secondary hyperparathyroidism of renal origin: Secondary | ICD-10-CM | POA: Diagnosis not present

## 2016-02-26 DIAGNOSIS — R8299 Other abnormal findings in urine: Secondary | ICD-10-CM | POA: Diagnosis not present

## 2016-02-26 DIAGNOSIS — D631 Anemia in chronic kidney disease: Secondary | ICD-10-CM | POA: Diagnosis not present

## 2016-02-26 DIAGNOSIS — N2589 Other disorders resulting from impaired renal tubular function: Secondary | ICD-10-CM | POA: Diagnosis not present

## 2016-02-26 DIAGNOSIS — N186 End stage renal disease: Secondary | ICD-10-CM | POA: Diagnosis not present

## 2016-02-27 DIAGNOSIS — N186 End stage renal disease: Secondary | ICD-10-CM | POA: Diagnosis not present

## 2016-02-27 DIAGNOSIS — E784 Other hyperlipidemia: Secondary | ICD-10-CM | POA: Diagnosis not present

## 2016-02-27 DIAGNOSIS — N2581 Secondary hyperparathyroidism of renal origin: Secondary | ICD-10-CM | POA: Diagnosis not present

## 2016-02-27 DIAGNOSIS — N2589 Other disorders resulting from impaired renal tubular function: Secondary | ICD-10-CM | POA: Diagnosis not present

## 2016-02-27 DIAGNOSIS — D631 Anemia in chronic kidney disease: Secondary | ICD-10-CM | POA: Diagnosis not present

## 2016-02-27 DIAGNOSIS — R8299 Other abnormal findings in urine: Secondary | ICD-10-CM | POA: Diagnosis not present

## 2016-02-27 DIAGNOSIS — D509 Iron deficiency anemia, unspecified: Secondary | ICD-10-CM | POA: Diagnosis not present

## 2016-02-27 DIAGNOSIS — E119 Type 2 diabetes mellitus without complications: Secondary | ICD-10-CM | POA: Diagnosis not present

## 2016-02-27 DIAGNOSIS — Z23 Encounter for immunization: Secondary | ICD-10-CM | POA: Diagnosis not present

## 2016-02-28 DIAGNOSIS — N2581 Secondary hyperparathyroidism of renal origin: Secondary | ICD-10-CM | POA: Diagnosis not present

## 2016-02-28 DIAGNOSIS — Z23 Encounter for immunization: Secondary | ICD-10-CM | POA: Diagnosis not present

## 2016-02-28 DIAGNOSIS — N2589 Other disorders resulting from impaired renal tubular function: Secondary | ICD-10-CM | POA: Diagnosis not present

## 2016-02-28 DIAGNOSIS — N186 End stage renal disease: Secondary | ICD-10-CM | POA: Diagnosis not present

## 2016-02-28 DIAGNOSIS — E119 Type 2 diabetes mellitus without complications: Secondary | ICD-10-CM | POA: Diagnosis not present

## 2016-02-28 DIAGNOSIS — D509 Iron deficiency anemia, unspecified: Secondary | ICD-10-CM | POA: Diagnosis not present

## 2016-02-28 DIAGNOSIS — D631 Anemia in chronic kidney disease: Secondary | ICD-10-CM | POA: Diagnosis not present

## 2016-02-28 DIAGNOSIS — R8299 Other abnormal findings in urine: Secondary | ICD-10-CM | POA: Diagnosis not present

## 2016-02-28 DIAGNOSIS — E784 Other hyperlipidemia: Secondary | ICD-10-CM | POA: Diagnosis not present

## 2016-02-29 DIAGNOSIS — E119 Type 2 diabetes mellitus without complications: Secondary | ICD-10-CM | POA: Diagnosis not present

## 2016-02-29 DIAGNOSIS — N2581 Secondary hyperparathyroidism of renal origin: Secondary | ICD-10-CM | POA: Diagnosis not present

## 2016-02-29 DIAGNOSIS — E784 Other hyperlipidemia: Secondary | ICD-10-CM | POA: Diagnosis not present

## 2016-02-29 DIAGNOSIS — R8299 Other abnormal findings in urine: Secondary | ICD-10-CM | POA: Diagnosis not present

## 2016-02-29 DIAGNOSIS — D509 Iron deficiency anemia, unspecified: Secondary | ICD-10-CM | POA: Diagnosis not present

## 2016-02-29 DIAGNOSIS — N186 End stage renal disease: Secondary | ICD-10-CM | POA: Diagnosis not present

## 2016-02-29 DIAGNOSIS — Z23 Encounter for immunization: Secondary | ICD-10-CM | POA: Diagnosis not present

## 2016-02-29 DIAGNOSIS — D631 Anemia in chronic kidney disease: Secondary | ICD-10-CM | POA: Diagnosis not present

## 2016-02-29 DIAGNOSIS — N2589 Other disorders resulting from impaired renal tubular function: Secondary | ICD-10-CM | POA: Diagnosis not present

## 2016-03-01 DIAGNOSIS — E784 Other hyperlipidemia: Secondary | ICD-10-CM | POA: Diagnosis not present

## 2016-03-01 DIAGNOSIS — Z23 Encounter for immunization: Secondary | ICD-10-CM | POA: Diagnosis not present

## 2016-03-01 DIAGNOSIS — E119 Type 2 diabetes mellitus without complications: Secondary | ICD-10-CM | POA: Diagnosis not present

## 2016-03-01 DIAGNOSIS — R8299 Other abnormal findings in urine: Secondary | ICD-10-CM | POA: Diagnosis not present

## 2016-03-01 DIAGNOSIS — N186 End stage renal disease: Secondary | ICD-10-CM | POA: Diagnosis not present

## 2016-03-01 DIAGNOSIS — D509 Iron deficiency anemia, unspecified: Secondary | ICD-10-CM | POA: Diagnosis not present

## 2016-03-01 DIAGNOSIS — N2581 Secondary hyperparathyroidism of renal origin: Secondary | ICD-10-CM | POA: Diagnosis not present

## 2016-03-01 DIAGNOSIS — D631 Anemia in chronic kidney disease: Secondary | ICD-10-CM | POA: Diagnosis not present

## 2016-03-01 DIAGNOSIS — N2589 Other disorders resulting from impaired renal tubular function: Secondary | ICD-10-CM | POA: Diagnosis not present

## 2016-03-02 DIAGNOSIS — N2589 Other disorders resulting from impaired renal tubular function: Secondary | ICD-10-CM | POA: Diagnosis not present

## 2016-03-02 DIAGNOSIS — N2581 Secondary hyperparathyroidism of renal origin: Secondary | ICD-10-CM | POA: Diagnosis not present

## 2016-03-02 DIAGNOSIS — E784 Other hyperlipidemia: Secondary | ICD-10-CM | POA: Diagnosis not present

## 2016-03-02 DIAGNOSIS — E119 Type 2 diabetes mellitus without complications: Secondary | ICD-10-CM | POA: Diagnosis not present

## 2016-03-02 DIAGNOSIS — Z23 Encounter for immunization: Secondary | ICD-10-CM | POA: Diagnosis not present

## 2016-03-02 DIAGNOSIS — R8299 Other abnormal findings in urine: Secondary | ICD-10-CM | POA: Diagnosis not present

## 2016-03-02 DIAGNOSIS — N186 End stage renal disease: Secondary | ICD-10-CM | POA: Diagnosis not present

## 2016-03-02 DIAGNOSIS — D631 Anemia in chronic kidney disease: Secondary | ICD-10-CM | POA: Diagnosis not present

## 2016-03-02 DIAGNOSIS — D509 Iron deficiency anemia, unspecified: Secondary | ICD-10-CM | POA: Diagnosis not present

## 2016-03-03 ENCOUNTER — Ambulatory Visit
Admission: RE | Admit: 2016-03-03 | Discharge: 2016-03-03 | Disposition: A | Discharge status: Home / Self Care | Payer: Medicare Other | Source: Ambulatory Visit

## 2016-03-03 DIAGNOSIS — D631 Anemia in chronic kidney disease: Secondary | ICD-10-CM | POA: Diagnosis not present

## 2016-03-03 DIAGNOSIS — R8299 Other abnormal findings in urine: Secondary | ICD-10-CM | POA: Diagnosis not present

## 2016-03-03 DIAGNOSIS — Z23 Encounter for immunization: Secondary | ICD-10-CM | POA: Diagnosis not present

## 2016-03-03 DIAGNOSIS — N2589 Other disorders resulting from impaired renal tubular function: Secondary | ICD-10-CM | POA: Diagnosis not present

## 2016-03-03 DIAGNOSIS — Z1231 Encounter for screening mammogram for malignant neoplasm of breast: Secondary | ICD-10-CM | POA: Diagnosis not present

## 2016-03-03 DIAGNOSIS — N2581 Secondary hyperparathyroidism of renal origin: Secondary | ICD-10-CM | POA: Diagnosis not present

## 2016-03-03 DIAGNOSIS — E784 Other hyperlipidemia: Secondary | ICD-10-CM | POA: Diagnosis not present

## 2016-03-03 DIAGNOSIS — D509 Iron deficiency anemia, unspecified: Secondary | ICD-10-CM | POA: Diagnosis not present

## 2016-03-03 DIAGNOSIS — E119 Type 2 diabetes mellitus without complications: Secondary | ICD-10-CM | POA: Diagnosis not present

## 2016-03-03 DIAGNOSIS — N186 End stage renal disease: Secondary | ICD-10-CM | POA: Diagnosis not present

## 2016-03-04 DIAGNOSIS — Z23 Encounter for immunization: Secondary | ICD-10-CM | POA: Diagnosis not present

## 2016-03-04 DIAGNOSIS — E119 Type 2 diabetes mellitus without complications: Secondary | ICD-10-CM | POA: Diagnosis not present

## 2016-03-04 DIAGNOSIS — N2581 Secondary hyperparathyroidism of renal origin: Secondary | ICD-10-CM | POA: Diagnosis not present

## 2016-03-04 DIAGNOSIS — N2589 Other disorders resulting from impaired renal tubular function: Secondary | ICD-10-CM | POA: Diagnosis not present

## 2016-03-04 DIAGNOSIS — D631 Anemia in chronic kidney disease: Secondary | ICD-10-CM | POA: Diagnosis not present

## 2016-03-04 DIAGNOSIS — N186 End stage renal disease: Secondary | ICD-10-CM | POA: Diagnosis not present

## 2016-03-04 DIAGNOSIS — R8299 Other abnormal findings in urine: Secondary | ICD-10-CM | POA: Diagnosis not present

## 2016-03-04 DIAGNOSIS — E784 Other hyperlipidemia: Secondary | ICD-10-CM | POA: Diagnosis not present

## 2016-03-04 DIAGNOSIS — D509 Iron deficiency anemia, unspecified: Secondary | ICD-10-CM | POA: Diagnosis not present

## 2016-03-05 DIAGNOSIS — Z23 Encounter for immunization: Secondary | ICD-10-CM | POA: Diagnosis not present

## 2016-03-05 DIAGNOSIS — E119 Type 2 diabetes mellitus without complications: Secondary | ICD-10-CM | POA: Diagnosis not present

## 2016-03-05 DIAGNOSIS — N2581 Secondary hyperparathyroidism of renal origin: Secondary | ICD-10-CM | POA: Diagnosis not present

## 2016-03-05 DIAGNOSIS — N2589 Other disorders resulting from impaired renal tubular function: Secondary | ICD-10-CM | POA: Diagnosis not present

## 2016-03-05 DIAGNOSIS — N186 End stage renal disease: Secondary | ICD-10-CM | POA: Diagnosis not present

## 2016-03-05 DIAGNOSIS — R8299 Other abnormal findings in urine: Secondary | ICD-10-CM | POA: Diagnosis not present

## 2016-03-05 DIAGNOSIS — D631 Anemia in chronic kidney disease: Secondary | ICD-10-CM | POA: Diagnosis not present

## 2016-03-05 DIAGNOSIS — E784 Other hyperlipidemia: Secondary | ICD-10-CM | POA: Diagnosis not present

## 2016-03-05 DIAGNOSIS — D509 Iron deficiency anemia, unspecified: Secondary | ICD-10-CM | POA: Diagnosis not present

## 2016-03-06 DIAGNOSIS — D631 Anemia in chronic kidney disease: Secondary | ICD-10-CM | POA: Diagnosis not present

## 2016-03-06 DIAGNOSIS — N186 End stage renal disease: Secondary | ICD-10-CM | POA: Diagnosis not present

## 2016-03-06 DIAGNOSIS — E784 Other hyperlipidemia: Secondary | ICD-10-CM | POA: Diagnosis not present

## 2016-03-06 DIAGNOSIS — E119 Type 2 diabetes mellitus without complications: Secondary | ICD-10-CM | POA: Diagnosis not present

## 2016-03-06 DIAGNOSIS — Z23 Encounter for immunization: Secondary | ICD-10-CM | POA: Diagnosis not present

## 2016-03-06 DIAGNOSIS — R8299 Other abnormal findings in urine: Secondary | ICD-10-CM | POA: Diagnosis not present

## 2016-03-06 DIAGNOSIS — N2589 Other disorders resulting from impaired renal tubular function: Secondary | ICD-10-CM | POA: Diagnosis not present

## 2016-03-06 DIAGNOSIS — N2581 Secondary hyperparathyroidism of renal origin: Secondary | ICD-10-CM | POA: Diagnosis not present

## 2016-03-06 DIAGNOSIS — D509 Iron deficiency anemia, unspecified: Secondary | ICD-10-CM | POA: Diagnosis not present

## 2016-03-07 DIAGNOSIS — E119 Type 2 diabetes mellitus without complications: Secondary | ICD-10-CM | POA: Diagnosis not present

## 2016-03-07 DIAGNOSIS — D509 Iron deficiency anemia, unspecified: Secondary | ICD-10-CM | POA: Diagnosis not present

## 2016-03-07 DIAGNOSIS — R8299 Other abnormal findings in urine: Secondary | ICD-10-CM | POA: Diagnosis not present

## 2016-03-07 DIAGNOSIS — N2589 Other disorders resulting from impaired renal tubular function: Secondary | ICD-10-CM | POA: Diagnosis not present

## 2016-03-07 DIAGNOSIS — D631 Anemia in chronic kidney disease: Secondary | ICD-10-CM | POA: Diagnosis not present

## 2016-03-07 DIAGNOSIS — E784 Other hyperlipidemia: Secondary | ICD-10-CM | POA: Diagnosis not present

## 2016-03-07 DIAGNOSIS — N186 End stage renal disease: Secondary | ICD-10-CM | POA: Diagnosis not present

## 2016-03-07 DIAGNOSIS — Z23 Encounter for immunization: Secondary | ICD-10-CM | POA: Diagnosis not present

## 2016-03-07 DIAGNOSIS — N2581 Secondary hyperparathyroidism of renal origin: Secondary | ICD-10-CM | POA: Diagnosis not present

## 2016-03-08 DIAGNOSIS — Z23 Encounter for immunization: Secondary | ICD-10-CM | POA: Diagnosis not present

## 2016-03-08 DIAGNOSIS — R8299 Other abnormal findings in urine: Secondary | ICD-10-CM | POA: Diagnosis not present

## 2016-03-08 DIAGNOSIS — E119 Type 2 diabetes mellitus without complications: Secondary | ICD-10-CM | POA: Diagnosis not present

## 2016-03-08 DIAGNOSIS — D509 Iron deficiency anemia, unspecified: Secondary | ICD-10-CM | POA: Diagnosis not present

## 2016-03-08 DIAGNOSIS — N2581 Secondary hyperparathyroidism of renal origin: Secondary | ICD-10-CM | POA: Diagnosis not present

## 2016-03-08 DIAGNOSIS — N186 End stage renal disease: Secondary | ICD-10-CM | POA: Diagnosis not present

## 2016-03-08 DIAGNOSIS — D631 Anemia in chronic kidney disease: Secondary | ICD-10-CM | POA: Diagnosis not present

## 2016-03-08 DIAGNOSIS — E784 Other hyperlipidemia: Secondary | ICD-10-CM | POA: Diagnosis not present

## 2016-03-08 DIAGNOSIS — N2589 Other disorders resulting from impaired renal tubular function: Secondary | ICD-10-CM | POA: Diagnosis not present

## 2016-03-09 DIAGNOSIS — D509 Iron deficiency anemia, unspecified: Secondary | ICD-10-CM | POA: Diagnosis not present

## 2016-03-09 DIAGNOSIS — N2581 Secondary hyperparathyroidism of renal origin: Secondary | ICD-10-CM | POA: Diagnosis not present

## 2016-03-09 DIAGNOSIS — R8299 Other abnormal findings in urine: Secondary | ICD-10-CM | POA: Diagnosis not present

## 2016-03-09 DIAGNOSIS — E119 Type 2 diabetes mellitus without complications: Secondary | ICD-10-CM | POA: Diagnosis not present

## 2016-03-09 DIAGNOSIS — Z23 Encounter for immunization: Secondary | ICD-10-CM | POA: Diagnosis not present

## 2016-03-09 DIAGNOSIS — E784 Other hyperlipidemia: Secondary | ICD-10-CM | POA: Diagnosis not present

## 2016-03-09 DIAGNOSIS — N2589 Other disorders resulting from impaired renal tubular function: Secondary | ICD-10-CM | POA: Diagnosis not present

## 2016-03-09 DIAGNOSIS — D631 Anemia in chronic kidney disease: Secondary | ICD-10-CM | POA: Diagnosis not present

## 2016-03-09 DIAGNOSIS — N186 End stage renal disease: Secondary | ICD-10-CM | POA: Diagnosis not present

## 2016-03-10 DIAGNOSIS — N2589 Other disorders resulting from impaired renal tubular function: Secondary | ICD-10-CM | POA: Diagnosis not present

## 2016-03-10 DIAGNOSIS — R8299 Other abnormal findings in urine: Secondary | ICD-10-CM | POA: Diagnosis not present

## 2016-03-10 DIAGNOSIS — E784 Other hyperlipidemia: Secondary | ICD-10-CM | POA: Diagnosis not present

## 2016-03-10 DIAGNOSIS — Z23 Encounter for immunization: Secondary | ICD-10-CM | POA: Diagnosis not present

## 2016-03-10 DIAGNOSIS — N2581 Secondary hyperparathyroidism of renal origin: Secondary | ICD-10-CM | POA: Diagnosis not present

## 2016-03-10 DIAGNOSIS — D631 Anemia in chronic kidney disease: Secondary | ICD-10-CM | POA: Diagnosis not present

## 2016-03-10 DIAGNOSIS — D509 Iron deficiency anemia, unspecified: Secondary | ICD-10-CM | POA: Diagnosis not present

## 2016-03-10 DIAGNOSIS — N186 End stage renal disease: Secondary | ICD-10-CM | POA: Diagnosis not present

## 2016-03-10 DIAGNOSIS — E119 Type 2 diabetes mellitus without complications: Secondary | ICD-10-CM | POA: Diagnosis not present

## 2016-03-11 DIAGNOSIS — D631 Anemia in chronic kidney disease: Secondary | ICD-10-CM | POA: Diagnosis not present

## 2016-03-11 DIAGNOSIS — D509 Iron deficiency anemia, unspecified: Secondary | ICD-10-CM | POA: Diagnosis not present

## 2016-03-11 DIAGNOSIS — Z23 Encounter for immunization: Secondary | ICD-10-CM | POA: Diagnosis not present

## 2016-03-11 DIAGNOSIS — R8299 Other abnormal findings in urine: Secondary | ICD-10-CM | POA: Diagnosis not present

## 2016-03-11 DIAGNOSIS — E784 Other hyperlipidemia: Secondary | ICD-10-CM | POA: Diagnosis not present

## 2016-03-11 DIAGNOSIS — N2581 Secondary hyperparathyroidism of renal origin: Secondary | ICD-10-CM | POA: Diagnosis not present

## 2016-03-11 DIAGNOSIS — E119 Type 2 diabetes mellitus without complications: Secondary | ICD-10-CM | POA: Diagnosis not present

## 2016-03-11 DIAGNOSIS — N2589 Other disorders resulting from impaired renal tubular function: Secondary | ICD-10-CM | POA: Diagnosis not present

## 2016-03-11 DIAGNOSIS — N186 End stage renal disease: Secondary | ICD-10-CM | POA: Diagnosis not present

## 2016-03-12 DIAGNOSIS — N2589 Other disorders resulting from impaired renal tubular function: Secondary | ICD-10-CM | POA: Diagnosis not present

## 2016-03-12 DIAGNOSIS — E119 Type 2 diabetes mellitus without complications: Secondary | ICD-10-CM | POA: Diagnosis not present

## 2016-03-12 DIAGNOSIS — N2581 Secondary hyperparathyroidism of renal origin: Secondary | ICD-10-CM | POA: Diagnosis not present

## 2016-03-12 DIAGNOSIS — N186 End stage renal disease: Secondary | ICD-10-CM | POA: Diagnosis not present

## 2016-03-12 DIAGNOSIS — R8299 Other abnormal findings in urine: Secondary | ICD-10-CM | POA: Diagnosis not present

## 2016-03-12 DIAGNOSIS — D509 Iron deficiency anemia, unspecified: Secondary | ICD-10-CM | POA: Diagnosis not present

## 2016-03-12 DIAGNOSIS — E784 Other hyperlipidemia: Secondary | ICD-10-CM | POA: Diagnosis not present

## 2016-03-12 DIAGNOSIS — D631 Anemia in chronic kidney disease: Secondary | ICD-10-CM | POA: Diagnosis not present

## 2016-03-12 DIAGNOSIS — Z23 Encounter for immunization: Secondary | ICD-10-CM | POA: Diagnosis not present

## 2016-03-13 DIAGNOSIS — N2589 Other disorders resulting from impaired renal tubular function: Secondary | ICD-10-CM | POA: Diagnosis not present

## 2016-03-13 DIAGNOSIS — R8299 Other abnormal findings in urine: Secondary | ICD-10-CM | POA: Diagnosis not present

## 2016-03-13 DIAGNOSIS — N2581 Secondary hyperparathyroidism of renal origin: Secondary | ICD-10-CM | POA: Diagnosis not present

## 2016-03-13 DIAGNOSIS — E784 Other hyperlipidemia: Secondary | ICD-10-CM | POA: Diagnosis not present

## 2016-03-13 DIAGNOSIS — D631 Anemia in chronic kidney disease: Secondary | ICD-10-CM | POA: Diagnosis not present

## 2016-03-13 DIAGNOSIS — E119 Type 2 diabetes mellitus without complications: Secondary | ICD-10-CM | POA: Diagnosis not present

## 2016-03-13 DIAGNOSIS — D509 Iron deficiency anemia, unspecified: Secondary | ICD-10-CM | POA: Diagnosis not present

## 2016-03-13 DIAGNOSIS — Z23 Encounter for immunization: Secondary | ICD-10-CM | POA: Diagnosis not present

## 2016-03-13 DIAGNOSIS — N186 End stage renal disease: Secondary | ICD-10-CM | POA: Diagnosis not present

## 2016-03-14 DIAGNOSIS — Z23 Encounter for immunization: Secondary | ICD-10-CM | POA: Diagnosis not present

## 2016-03-14 DIAGNOSIS — R8299 Other abnormal findings in urine: Secondary | ICD-10-CM | POA: Diagnosis not present

## 2016-03-14 DIAGNOSIS — E119 Type 2 diabetes mellitus without complications: Secondary | ICD-10-CM | POA: Diagnosis not present

## 2016-03-14 DIAGNOSIS — N2581 Secondary hyperparathyroidism of renal origin: Secondary | ICD-10-CM | POA: Diagnosis not present

## 2016-03-14 DIAGNOSIS — D509 Iron deficiency anemia, unspecified: Secondary | ICD-10-CM | POA: Diagnosis not present

## 2016-03-14 DIAGNOSIS — D631 Anemia in chronic kidney disease: Secondary | ICD-10-CM | POA: Diagnosis not present

## 2016-03-14 DIAGNOSIS — N186 End stage renal disease: Secondary | ICD-10-CM | POA: Diagnosis not present

## 2016-03-14 DIAGNOSIS — E784 Other hyperlipidemia: Secondary | ICD-10-CM | POA: Diagnosis not present

## 2016-03-14 DIAGNOSIS — N2589 Other disorders resulting from impaired renal tubular function: Secondary | ICD-10-CM | POA: Diagnosis not present

## 2016-03-15 DIAGNOSIS — N2589 Other disorders resulting from impaired renal tubular function: Secondary | ICD-10-CM | POA: Diagnosis not present

## 2016-03-15 DIAGNOSIS — E119 Type 2 diabetes mellitus without complications: Secondary | ICD-10-CM | POA: Diagnosis not present

## 2016-03-15 DIAGNOSIS — N2581 Secondary hyperparathyroidism of renal origin: Secondary | ICD-10-CM | POA: Diagnosis not present

## 2016-03-15 DIAGNOSIS — D509 Iron deficiency anemia, unspecified: Secondary | ICD-10-CM | POA: Diagnosis not present

## 2016-03-15 DIAGNOSIS — D631 Anemia in chronic kidney disease: Secondary | ICD-10-CM | POA: Diagnosis not present

## 2016-03-15 DIAGNOSIS — E784 Other hyperlipidemia: Secondary | ICD-10-CM | POA: Diagnosis not present

## 2016-03-15 DIAGNOSIS — N186 End stage renal disease: Secondary | ICD-10-CM | POA: Diagnosis not present

## 2016-03-15 DIAGNOSIS — R8299 Other abnormal findings in urine: Secondary | ICD-10-CM | POA: Diagnosis not present

## 2016-03-15 DIAGNOSIS — Z23 Encounter for immunization: Secondary | ICD-10-CM | POA: Diagnosis not present

## 2016-03-16 DIAGNOSIS — D631 Anemia in chronic kidney disease: Secondary | ICD-10-CM | POA: Diagnosis not present

## 2016-03-16 DIAGNOSIS — E119 Type 2 diabetes mellitus without complications: Secondary | ICD-10-CM | POA: Diagnosis not present

## 2016-03-16 DIAGNOSIS — D509 Iron deficiency anemia, unspecified: Secondary | ICD-10-CM | POA: Diagnosis not present

## 2016-03-16 DIAGNOSIS — Z23 Encounter for immunization: Secondary | ICD-10-CM | POA: Diagnosis not present

## 2016-03-16 DIAGNOSIS — N2589 Other disorders resulting from impaired renal tubular function: Secondary | ICD-10-CM | POA: Diagnosis not present

## 2016-03-16 DIAGNOSIS — N2581 Secondary hyperparathyroidism of renal origin: Secondary | ICD-10-CM | POA: Diagnosis not present

## 2016-03-16 DIAGNOSIS — N186 End stage renal disease: Secondary | ICD-10-CM | POA: Diagnosis not present

## 2016-03-16 DIAGNOSIS — E784 Other hyperlipidemia: Secondary | ICD-10-CM | POA: Diagnosis not present

## 2016-03-16 DIAGNOSIS — R8299 Other abnormal findings in urine: Secondary | ICD-10-CM | POA: Diagnosis not present

## 2016-03-17 DIAGNOSIS — R8299 Other abnormal findings in urine: Secondary | ICD-10-CM | POA: Diagnosis not present

## 2016-03-17 DIAGNOSIS — N186 End stage renal disease: Secondary | ICD-10-CM | POA: Diagnosis not present

## 2016-03-17 DIAGNOSIS — N2581 Secondary hyperparathyroidism of renal origin: Secondary | ICD-10-CM | POA: Diagnosis not present

## 2016-03-17 DIAGNOSIS — E784 Other hyperlipidemia: Secondary | ICD-10-CM | POA: Diagnosis not present

## 2016-03-17 DIAGNOSIS — N2589 Other disorders resulting from impaired renal tubular function: Secondary | ICD-10-CM | POA: Diagnosis not present

## 2016-03-17 DIAGNOSIS — Z23 Encounter for immunization: Secondary | ICD-10-CM | POA: Diagnosis not present

## 2016-03-17 DIAGNOSIS — D509 Iron deficiency anemia, unspecified: Secondary | ICD-10-CM | POA: Diagnosis not present

## 2016-03-17 DIAGNOSIS — E119 Type 2 diabetes mellitus without complications: Secondary | ICD-10-CM | POA: Diagnosis not present

## 2016-03-17 DIAGNOSIS — D631 Anemia in chronic kidney disease: Secondary | ICD-10-CM | POA: Diagnosis not present

## 2016-03-18 DIAGNOSIS — R8299 Other abnormal findings in urine: Secondary | ICD-10-CM | POA: Diagnosis not present

## 2016-03-18 DIAGNOSIS — Z23 Encounter for immunization: Secondary | ICD-10-CM | POA: Diagnosis not present

## 2016-03-18 DIAGNOSIS — E784 Other hyperlipidemia: Secondary | ICD-10-CM | POA: Diagnosis not present

## 2016-03-18 DIAGNOSIS — N186 End stage renal disease: Secondary | ICD-10-CM | POA: Diagnosis not present

## 2016-03-18 DIAGNOSIS — D509 Iron deficiency anemia, unspecified: Secondary | ICD-10-CM | POA: Diagnosis not present

## 2016-03-18 DIAGNOSIS — E119 Type 2 diabetes mellitus without complications: Secondary | ICD-10-CM | POA: Diagnosis not present

## 2016-03-18 DIAGNOSIS — N2589 Other disorders resulting from impaired renal tubular function: Secondary | ICD-10-CM | POA: Diagnosis not present

## 2016-03-18 DIAGNOSIS — D631 Anemia in chronic kidney disease: Secondary | ICD-10-CM | POA: Diagnosis not present

## 2016-03-18 DIAGNOSIS — N2581 Secondary hyperparathyroidism of renal origin: Secondary | ICD-10-CM | POA: Diagnosis not present

## 2016-03-19 DIAGNOSIS — D509 Iron deficiency anemia, unspecified: Secondary | ICD-10-CM | POA: Diagnosis not present

## 2016-03-19 DIAGNOSIS — N2581 Secondary hyperparathyroidism of renal origin: Secondary | ICD-10-CM | POA: Diagnosis not present

## 2016-03-19 DIAGNOSIS — E784 Other hyperlipidemia: Secondary | ICD-10-CM | POA: Diagnosis not present

## 2016-03-19 DIAGNOSIS — N186 End stage renal disease: Secondary | ICD-10-CM | POA: Diagnosis not present

## 2016-03-19 DIAGNOSIS — R8299 Other abnormal findings in urine: Secondary | ICD-10-CM | POA: Diagnosis not present

## 2016-03-19 DIAGNOSIS — N2589 Other disorders resulting from impaired renal tubular function: Secondary | ICD-10-CM | POA: Diagnosis not present

## 2016-03-19 DIAGNOSIS — E119 Type 2 diabetes mellitus without complications: Secondary | ICD-10-CM | POA: Diagnosis not present

## 2016-03-19 DIAGNOSIS — E1122 Type 2 diabetes mellitus with diabetic chronic kidney disease: Secondary | ICD-10-CM | POA: Diagnosis not present

## 2016-03-19 DIAGNOSIS — Z992 Dependence on renal dialysis: Secondary | ICD-10-CM | POA: Diagnosis not present

## 2016-03-19 DIAGNOSIS — Z23 Encounter for immunization: Secondary | ICD-10-CM | POA: Diagnosis not present

## 2016-03-19 DIAGNOSIS — D631 Anemia in chronic kidney disease: Secondary | ICD-10-CM | POA: Diagnosis not present

## 2016-03-20 DIAGNOSIS — R8299 Other abnormal findings in urine: Secondary | ICD-10-CM | POA: Diagnosis not present

## 2016-03-20 DIAGNOSIS — Z79899 Other long term (current) drug therapy: Secondary | ICD-10-CM | POA: Diagnosis not present

## 2016-03-20 DIAGNOSIS — D509 Iron deficiency anemia, unspecified: Secondary | ICD-10-CM | POA: Diagnosis not present

## 2016-03-20 DIAGNOSIS — R17 Unspecified jaundice: Secondary | ICD-10-CM | POA: Diagnosis not present

## 2016-03-20 DIAGNOSIS — N186 End stage renal disease: Secondary | ICD-10-CM | POA: Diagnosis not present

## 2016-03-20 DIAGNOSIS — D631 Anemia in chronic kidney disease: Secondary | ICD-10-CM | POA: Diagnosis not present

## 2016-03-20 DIAGNOSIS — Z23 Encounter for immunization: Secondary | ICD-10-CM | POA: Diagnosis not present

## 2016-03-20 DIAGNOSIS — E440 Moderate protein-calorie malnutrition: Secondary | ICD-10-CM | POA: Diagnosis not present

## 2016-03-21 ENCOUNTER — Other Ambulatory Visit: Payer: Self-pay | Admitting: *Deleted

## 2016-03-21 DIAGNOSIS — Z0001 Encounter for general adult medical examination with abnormal findings: Secondary | ICD-10-CM

## 2016-03-21 DIAGNOSIS — D631 Anemia in chronic kidney disease: Secondary | ICD-10-CM | POA: Diagnosis not present

## 2016-03-21 DIAGNOSIS — E440 Moderate protein-calorie malnutrition: Secondary | ICD-10-CM | POA: Diagnosis not present

## 2016-03-21 DIAGNOSIS — D509 Iron deficiency anemia, unspecified: Secondary | ICD-10-CM | POA: Diagnosis not present

## 2016-03-21 DIAGNOSIS — Z23 Encounter for immunization: Secondary | ICD-10-CM | POA: Diagnosis not present

## 2016-03-21 DIAGNOSIS — R17 Unspecified jaundice: Secondary | ICD-10-CM | POA: Diagnosis not present

## 2016-03-21 DIAGNOSIS — N186 End stage renal disease: Secondary | ICD-10-CM | POA: Diagnosis not present

## 2016-03-21 DIAGNOSIS — Z79899 Other long term (current) drug therapy: Secondary | ICD-10-CM | POA: Diagnosis not present

## 2016-03-21 DIAGNOSIS — Z1212 Encounter for screening for malignant neoplasm of rectum: Secondary | ICD-10-CM

## 2016-03-21 DIAGNOSIS — R8299 Other abnormal findings in urine: Secondary | ICD-10-CM | POA: Diagnosis not present

## 2016-03-21 LAB — POC HEMOCCULT BLD/STL (HOME/3-CARD/SCREEN)
Card #2 Fecal Occult Blod, POC: NEGATIVE
FECAL OCCULT BLD: NEGATIVE
FECAL OCCULT BLD: NEGATIVE

## 2016-03-22 DIAGNOSIS — R8299 Other abnormal findings in urine: Secondary | ICD-10-CM | POA: Diagnosis not present

## 2016-03-22 DIAGNOSIS — D631 Anemia in chronic kidney disease: Secondary | ICD-10-CM | POA: Diagnosis not present

## 2016-03-22 DIAGNOSIS — E440 Moderate protein-calorie malnutrition: Secondary | ICD-10-CM | POA: Diagnosis not present

## 2016-03-22 DIAGNOSIS — N186 End stage renal disease: Secondary | ICD-10-CM | POA: Diagnosis not present

## 2016-03-22 DIAGNOSIS — D509 Iron deficiency anemia, unspecified: Secondary | ICD-10-CM | POA: Diagnosis not present

## 2016-03-22 DIAGNOSIS — R17 Unspecified jaundice: Secondary | ICD-10-CM | POA: Diagnosis not present

## 2016-03-22 DIAGNOSIS — Z23 Encounter for immunization: Secondary | ICD-10-CM | POA: Diagnosis not present

## 2016-03-22 DIAGNOSIS — Z79899 Other long term (current) drug therapy: Secondary | ICD-10-CM | POA: Diagnosis not present

## 2016-03-23 DIAGNOSIS — Z23 Encounter for immunization: Secondary | ICD-10-CM | POA: Diagnosis not present

## 2016-03-23 DIAGNOSIS — E440 Moderate protein-calorie malnutrition: Secondary | ICD-10-CM | POA: Diagnosis not present

## 2016-03-23 DIAGNOSIS — D631 Anemia in chronic kidney disease: Secondary | ICD-10-CM | POA: Diagnosis not present

## 2016-03-23 DIAGNOSIS — D509 Iron deficiency anemia, unspecified: Secondary | ICD-10-CM | POA: Diagnosis not present

## 2016-03-23 DIAGNOSIS — Z79899 Other long term (current) drug therapy: Secondary | ICD-10-CM | POA: Diagnosis not present

## 2016-03-23 DIAGNOSIS — R8299 Other abnormal findings in urine: Secondary | ICD-10-CM | POA: Diagnosis not present

## 2016-03-23 DIAGNOSIS — N186 End stage renal disease: Secondary | ICD-10-CM | POA: Diagnosis not present

## 2016-03-23 DIAGNOSIS — R17 Unspecified jaundice: Secondary | ICD-10-CM | POA: Diagnosis not present

## 2016-03-24 DIAGNOSIS — Z79899 Other long term (current) drug therapy: Secondary | ICD-10-CM | POA: Diagnosis not present

## 2016-03-24 DIAGNOSIS — E440 Moderate protein-calorie malnutrition: Secondary | ICD-10-CM | POA: Diagnosis not present

## 2016-03-24 DIAGNOSIS — Z23 Encounter for immunization: Secondary | ICD-10-CM | POA: Diagnosis not present

## 2016-03-24 DIAGNOSIS — R17 Unspecified jaundice: Secondary | ICD-10-CM | POA: Diagnosis not present

## 2016-03-24 DIAGNOSIS — D509 Iron deficiency anemia, unspecified: Secondary | ICD-10-CM | POA: Diagnosis not present

## 2016-03-24 DIAGNOSIS — R8299 Other abnormal findings in urine: Secondary | ICD-10-CM | POA: Diagnosis not present

## 2016-03-24 DIAGNOSIS — D631 Anemia in chronic kidney disease: Secondary | ICD-10-CM | POA: Diagnosis not present

## 2016-03-24 DIAGNOSIS — N186 End stage renal disease: Secondary | ICD-10-CM | POA: Diagnosis not present

## 2016-03-25 DIAGNOSIS — E440 Moderate protein-calorie malnutrition: Secondary | ICD-10-CM | POA: Diagnosis not present

## 2016-03-25 DIAGNOSIS — D509 Iron deficiency anemia, unspecified: Secondary | ICD-10-CM | POA: Diagnosis not present

## 2016-03-25 DIAGNOSIS — Z79899 Other long term (current) drug therapy: Secondary | ICD-10-CM | POA: Diagnosis not present

## 2016-03-25 DIAGNOSIS — D631 Anemia in chronic kidney disease: Secondary | ICD-10-CM | POA: Diagnosis not present

## 2016-03-25 DIAGNOSIS — R17 Unspecified jaundice: Secondary | ICD-10-CM | POA: Diagnosis not present

## 2016-03-25 DIAGNOSIS — N186 End stage renal disease: Secondary | ICD-10-CM | POA: Diagnosis not present

## 2016-03-25 DIAGNOSIS — Z23 Encounter for immunization: Secondary | ICD-10-CM | POA: Diagnosis not present

## 2016-03-25 DIAGNOSIS — R8299 Other abnormal findings in urine: Secondary | ICD-10-CM | POA: Diagnosis not present

## 2016-03-26 DIAGNOSIS — Z79899 Other long term (current) drug therapy: Secondary | ICD-10-CM | POA: Diagnosis not present

## 2016-03-26 DIAGNOSIS — Z23 Encounter for immunization: Secondary | ICD-10-CM | POA: Diagnosis not present

## 2016-03-26 DIAGNOSIS — E440 Moderate protein-calorie malnutrition: Secondary | ICD-10-CM | POA: Diagnosis not present

## 2016-03-26 DIAGNOSIS — D631 Anemia in chronic kidney disease: Secondary | ICD-10-CM | POA: Diagnosis not present

## 2016-03-26 DIAGNOSIS — N186 End stage renal disease: Secondary | ICD-10-CM | POA: Diagnosis not present

## 2016-03-26 DIAGNOSIS — R17 Unspecified jaundice: Secondary | ICD-10-CM | POA: Diagnosis not present

## 2016-03-26 DIAGNOSIS — D509 Iron deficiency anemia, unspecified: Secondary | ICD-10-CM | POA: Diagnosis not present

## 2016-03-26 DIAGNOSIS — R8299 Other abnormal findings in urine: Secondary | ICD-10-CM | POA: Diagnosis not present

## 2016-03-27 DIAGNOSIS — D509 Iron deficiency anemia, unspecified: Secondary | ICD-10-CM | POA: Diagnosis not present

## 2016-03-27 DIAGNOSIS — D631 Anemia in chronic kidney disease: Secondary | ICD-10-CM | POA: Diagnosis not present

## 2016-03-27 DIAGNOSIS — E440 Moderate protein-calorie malnutrition: Secondary | ICD-10-CM | POA: Diagnosis not present

## 2016-03-27 DIAGNOSIS — Z79899 Other long term (current) drug therapy: Secondary | ICD-10-CM | POA: Diagnosis not present

## 2016-03-27 DIAGNOSIS — R17 Unspecified jaundice: Secondary | ICD-10-CM | POA: Diagnosis not present

## 2016-03-27 DIAGNOSIS — Z23 Encounter for immunization: Secondary | ICD-10-CM | POA: Diagnosis not present

## 2016-03-27 DIAGNOSIS — R8299 Other abnormal findings in urine: Secondary | ICD-10-CM | POA: Diagnosis not present

## 2016-03-27 DIAGNOSIS — N186 End stage renal disease: Secondary | ICD-10-CM | POA: Diagnosis not present

## 2016-03-28 DIAGNOSIS — R17 Unspecified jaundice: Secondary | ICD-10-CM | POA: Diagnosis not present

## 2016-03-28 DIAGNOSIS — E440 Moderate protein-calorie malnutrition: Secondary | ICD-10-CM | POA: Diagnosis not present

## 2016-03-28 DIAGNOSIS — Z23 Encounter for immunization: Secondary | ICD-10-CM | POA: Diagnosis not present

## 2016-03-28 DIAGNOSIS — R8299 Other abnormal findings in urine: Secondary | ICD-10-CM | POA: Diagnosis not present

## 2016-03-28 DIAGNOSIS — D509 Iron deficiency anemia, unspecified: Secondary | ICD-10-CM | POA: Diagnosis not present

## 2016-03-28 DIAGNOSIS — N186 End stage renal disease: Secondary | ICD-10-CM | POA: Diagnosis not present

## 2016-03-28 DIAGNOSIS — D631 Anemia in chronic kidney disease: Secondary | ICD-10-CM | POA: Diagnosis not present

## 2016-03-28 DIAGNOSIS — Z79899 Other long term (current) drug therapy: Secondary | ICD-10-CM | POA: Diagnosis not present

## 2016-03-29 DIAGNOSIS — D631 Anemia in chronic kidney disease: Secondary | ICD-10-CM | POA: Diagnosis not present

## 2016-03-29 DIAGNOSIS — Z23 Encounter for immunization: Secondary | ICD-10-CM | POA: Diagnosis not present

## 2016-03-29 DIAGNOSIS — R8299 Other abnormal findings in urine: Secondary | ICD-10-CM | POA: Diagnosis not present

## 2016-03-29 DIAGNOSIS — N186 End stage renal disease: Secondary | ICD-10-CM | POA: Diagnosis not present

## 2016-03-29 DIAGNOSIS — E440 Moderate protein-calorie malnutrition: Secondary | ICD-10-CM | POA: Diagnosis not present

## 2016-03-29 DIAGNOSIS — D509 Iron deficiency anemia, unspecified: Secondary | ICD-10-CM | POA: Diagnosis not present

## 2016-03-29 DIAGNOSIS — Z79899 Other long term (current) drug therapy: Secondary | ICD-10-CM | POA: Diagnosis not present

## 2016-03-29 DIAGNOSIS — R17 Unspecified jaundice: Secondary | ICD-10-CM | POA: Diagnosis not present

## 2016-03-30 DIAGNOSIS — D631 Anemia in chronic kidney disease: Secondary | ICD-10-CM | POA: Diagnosis not present

## 2016-03-30 DIAGNOSIS — N186 End stage renal disease: Secondary | ICD-10-CM | POA: Diagnosis not present

## 2016-03-30 DIAGNOSIS — Z79899 Other long term (current) drug therapy: Secondary | ICD-10-CM | POA: Diagnosis not present

## 2016-03-30 DIAGNOSIS — R17 Unspecified jaundice: Secondary | ICD-10-CM | POA: Diagnosis not present

## 2016-03-30 DIAGNOSIS — Z23 Encounter for immunization: Secondary | ICD-10-CM | POA: Diagnosis not present

## 2016-03-30 DIAGNOSIS — R8299 Other abnormal findings in urine: Secondary | ICD-10-CM | POA: Diagnosis not present

## 2016-03-30 DIAGNOSIS — D509 Iron deficiency anemia, unspecified: Secondary | ICD-10-CM | POA: Diagnosis not present

## 2016-03-30 DIAGNOSIS — E440 Moderate protein-calorie malnutrition: Secondary | ICD-10-CM | POA: Diagnosis not present

## 2016-03-31 DIAGNOSIS — Z79899 Other long term (current) drug therapy: Secondary | ICD-10-CM | POA: Diagnosis not present

## 2016-03-31 DIAGNOSIS — D631 Anemia in chronic kidney disease: Secondary | ICD-10-CM | POA: Diagnosis not present

## 2016-03-31 DIAGNOSIS — E440 Moderate protein-calorie malnutrition: Secondary | ICD-10-CM | POA: Diagnosis not present

## 2016-03-31 DIAGNOSIS — R17 Unspecified jaundice: Secondary | ICD-10-CM | POA: Diagnosis not present

## 2016-03-31 DIAGNOSIS — R8299 Other abnormal findings in urine: Secondary | ICD-10-CM | POA: Diagnosis not present

## 2016-03-31 DIAGNOSIS — N186 End stage renal disease: Secondary | ICD-10-CM | POA: Diagnosis not present

## 2016-03-31 DIAGNOSIS — Z23 Encounter for immunization: Secondary | ICD-10-CM | POA: Diagnosis not present

## 2016-03-31 DIAGNOSIS — D509 Iron deficiency anemia, unspecified: Secondary | ICD-10-CM | POA: Diagnosis not present

## 2016-04-01 DIAGNOSIS — D509 Iron deficiency anemia, unspecified: Secondary | ICD-10-CM | POA: Diagnosis not present

## 2016-04-01 DIAGNOSIS — D631 Anemia in chronic kidney disease: Secondary | ICD-10-CM | POA: Diagnosis not present

## 2016-04-01 DIAGNOSIS — E440 Moderate protein-calorie malnutrition: Secondary | ICD-10-CM | POA: Diagnosis not present

## 2016-04-01 DIAGNOSIS — R8299 Other abnormal findings in urine: Secondary | ICD-10-CM | POA: Diagnosis not present

## 2016-04-01 DIAGNOSIS — Z79899 Other long term (current) drug therapy: Secondary | ICD-10-CM | POA: Diagnosis not present

## 2016-04-01 DIAGNOSIS — R17 Unspecified jaundice: Secondary | ICD-10-CM | POA: Diagnosis not present

## 2016-04-01 DIAGNOSIS — Z23 Encounter for immunization: Secondary | ICD-10-CM | POA: Diagnosis not present

## 2016-04-01 DIAGNOSIS — N186 End stage renal disease: Secondary | ICD-10-CM | POA: Diagnosis not present

## 2016-04-02 DIAGNOSIS — E440 Moderate protein-calorie malnutrition: Secondary | ICD-10-CM | POA: Diagnosis not present

## 2016-04-02 DIAGNOSIS — R17 Unspecified jaundice: Secondary | ICD-10-CM | POA: Diagnosis not present

## 2016-04-02 DIAGNOSIS — D509 Iron deficiency anemia, unspecified: Secondary | ICD-10-CM | POA: Diagnosis not present

## 2016-04-02 DIAGNOSIS — R8299 Other abnormal findings in urine: Secondary | ICD-10-CM | POA: Diagnosis not present

## 2016-04-02 DIAGNOSIS — D631 Anemia in chronic kidney disease: Secondary | ICD-10-CM | POA: Diagnosis not present

## 2016-04-02 DIAGNOSIS — Z23 Encounter for immunization: Secondary | ICD-10-CM | POA: Diagnosis not present

## 2016-04-02 DIAGNOSIS — N186 End stage renal disease: Secondary | ICD-10-CM | POA: Diagnosis not present

## 2016-04-02 DIAGNOSIS — Z79899 Other long term (current) drug therapy: Secondary | ICD-10-CM | POA: Diagnosis not present

## 2016-04-03 DIAGNOSIS — R8299 Other abnormal findings in urine: Secondary | ICD-10-CM | POA: Diagnosis not present

## 2016-04-03 DIAGNOSIS — R17 Unspecified jaundice: Secondary | ICD-10-CM | POA: Diagnosis not present

## 2016-04-03 DIAGNOSIS — Z79899 Other long term (current) drug therapy: Secondary | ICD-10-CM | POA: Diagnosis not present

## 2016-04-03 DIAGNOSIS — D509 Iron deficiency anemia, unspecified: Secondary | ICD-10-CM | POA: Diagnosis not present

## 2016-04-03 DIAGNOSIS — D631 Anemia in chronic kidney disease: Secondary | ICD-10-CM | POA: Diagnosis not present

## 2016-04-03 DIAGNOSIS — Z23 Encounter for immunization: Secondary | ICD-10-CM | POA: Diagnosis not present

## 2016-04-03 DIAGNOSIS — N186 End stage renal disease: Secondary | ICD-10-CM | POA: Diagnosis not present

## 2016-04-03 DIAGNOSIS — E440 Moderate protein-calorie malnutrition: Secondary | ICD-10-CM | POA: Diagnosis not present

## 2016-04-04 DIAGNOSIS — E440 Moderate protein-calorie malnutrition: Secondary | ICD-10-CM | POA: Diagnosis not present

## 2016-04-04 DIAGNOSIS — Z23 Encounter for immunization: Secondary | ICD-10-CM | POA: Diagnosis not present

## 2016-04-04 DIAGNOSIS — R17 Unspecified jaundice: Secondary | ICD-10-CM | POA: Diagnosis not present

## 2016-04-04 DIAGNOSIS — D631 Anemia in chronic kidney disease: Secondary | ICD-10-CM | POA: Diagnosis not present

## 2016-04-04 DIAGNOSIS — D509 Iron deficiency anemia, unspecified: Secondary | ICD-10-CM | POA: Diagnosis not present

## 2016-04-04 DIAGNOSIS — N186 End stage renal disease: Secondary | ICD-10-CM | POA: Diagnosis not present

## 2016-04-04 DIAGNOSIS — Z79899 Other long term (current) drug therapy: Secondary | ICD-10-CM | POA: Diagnosis not present

## 2016-04-04 DIAGNOSIS — R8299 Other abnormal findings in urine: Secondary | ICD-10-CM | POA: Diagnosis not present

## 2016-04-05 DIAGNOSIS — R17 Unspecified jaundice: Secondary | ICD-10-CM | POA: Diagnosis not present

## 2016-04-05 DIAGNOSIS — N186 End stage renal disease: Secondary | ICD-10-CM | POA: Diagnosis not present

## 2016-04-05 DIAGNOSIS — Z79899 Other long term (current) drug therapy: Secondary | ICD-10-CM | POA: Diagnosis not present

## 2016-04-05 DIAGNOSIS — R8299 Other abnormal findings in urine: Secondary | ICD-10-CM | POA: Diagnosis not present

## 2016-04-05 DIAGNOSIS — Z23 Encounter for immunization: Secondary | ICD-10-CM | POA: Diagnosis not present

## 2016-04-05 DIAGNOSIS — E440 Moderate protein-calorie malnutrition: Secondary | ICD-10-CM | POA: Diagnosis not present

## 2016-04-05 DIAGNOSIS — D509 Iron deficiency anemia, unspecified: Secondary | ICD-10-CM | POA: Diagnosis not present

## 2016-04-05 DIAGNOSIS — D631 Anemia in chronic kidney disease: Secondary | ICD-10-CM | POA: Diagnosis not present

## 2016-04-06 DIAGNOSIS — D509 Iron deficiency anemia, unspecified: Secondary | ICD-10-CM | POA: Diagnosis not present

## 2016-04-06 DIAGNOSIS — E440 Moderate protein-calorie malnutrition: Secondary | ICD-10-CM | POA: Diagnosis not present

## 2016-04-06 DIAGNOSIS — N186 End stage renal disease: Secondary | ICD-10-CM | POA: Diagnosis not present

## 2016-04-06 DIAGNOSIS — D631 Anemia in chronic kidney disease: Secondary | ICD-10-CM | POA: Diagnosis not present

## 2016-04-06 DIAGNOSIS — R17 Unspecified jaundice: Secondary | ICD-10-CM | POA: Diagnosis not present

## 2016-04-06 DIAGNOSIS — R8299 Other abnormal findings in urine: Secondary | ICD-10-CM | POA: Diagnosis not present

## 2016-04-06 DIAGNOSIS — Z23 Encounter for immunization: Secondary | ICD-10-CM | POA: Diagnosis not present

## 2016-04-06 DIAGNOSIS — Z79899 Other long term (current) drug therapy: Secondary | ICD-10-CM | POA: Diagnosis not present

## 2016-04-07 DIAGNOSIS — D509 Iron deficiency anemia, unspecified: Secondary | ICD-10-CM | POA: Diagnosis not present

## 2016-04-07 DIAGNOSIS — Z23 Encounter for immunization: Secondary | ICD-10-CM | POA: Diagnosis not present

## 2016-04-07 DIAGNOSIS — R17 Unspecified jaundice: Secondary | ICD-10-CM | POA: Diagnosis not present

## 2016-04-07 DIAGNOSIS — N186 End stage renal disease: Secondary | ICD-10-CM | POA: Diagnosis not present

## 2016-04-07 DIAGNOSIS — R8299 Other abnormal findings in urine: Secondary | ICD-10-CM | POA: Diagnosis not present

## 2016-04-07 DIAGNOSIS — Z79899 Other long term (current) drug therapy: Secondary | ICD-10-CM | POA: Diagnosis not present

## 2016-04-07 DIAGNOSIS — D631 Anemia in chronic kidney disease: Secondary | ICD-10-CM | POA: Diagnosis not present

## 2016-04-07 DIAGNOSIS — E440 Moderate protein-calorie malnutrition: Secondary | ICD-10-CM | POA: Diagnosis not present

## 2016-04-08 DIAGNOSIS — D509 Iron deficiency anemia, unspecified: Secondary | ICD-10-CM | POA: Diagnosis not present

## 2016-04-08 DIAGNOSIS — D631 Anemia in chronic kidney disease: Secondary | ICD-10-CM | POA: Diagnosis not present

## 2016-04-08 DIAGNOSIS — R17 Unspecified jaundice: Secondary | ICD-10-CM | POA: Diagnosis not present

## 2016-04-08 DIAGNOSIS — E440 Moderate protein-calorie malnutrition: Secondary | ICD-10-CM | POA: Diagnosis not present

## 2016-04-08 DIAGNOSIS — Z79899 Other long term (current) drug therapy: Secondary | ICD-10-CM | POA: Diagnosis not present

## 2016-04-08 DIAGNOSIS — R8299 Other abnormal findings in urine: Secondary | ICD-10-CM | POA: Diagnosis not present

## 2016-04-08 DIAGNOSIS — Z23 Encounter for immunization: Secondary | ICD-10-CM | POA: Diagnosis not present

## 2016-04-08 DIAGNOSIS — N186 End stage renal disease: Secondary | ICD-10-CM | POA: Diagnosis not present

## 2016-04-09 ENCOUNTER — Encounter: Payer: Self-pay | Admitting: *Deleted

## 2016-04-09 DIAGNOSIS — R17 Unspecified jaundice: Secondary | ICD-10-CM | POA: Diagnosis not present

## 2016-04-09 DIAGNOSIS — Z23 Encounter for immunization: Secondary | ICD-10-CM | POA: Diagnosis not present

## 2016-04-09 DIAGNOSIS — D631 Anemia in chronic kidney disease: Secondary | ICD-10-CM | POA: Diagnosis not present

## 2016-04-09 DIAGNOSIS — R8299 Other abnormal findings in urine: Secondary | ICD-10-CM | POA: Diagnosis not present

## 2016-04-09 DIAGNOSIS — E440 Moderate protein-calorie malnutrition: Secondary | ICD-10-CM | POA: Diagnosis not present

## 2016-04-09 DIAGNOSIS — Z79899 Other long term (current) drug therapy: Secondary | ICD-10-CM | POA: Diagnosis not present

## 2016-04-09 DIAGNOSIS — N186 End stage renal disease: Secondary | ICD-10-CM | POA: Diagnosis not present

## 2016-04-09 DIAGNOSIS — D509 Iron deficiency anemia, unspecified: Secondary | ICD-10-CM | POA: Diagnosis not present

## 2016-04-10 DIAGNOSIS — R8299 Other abnormal findings in urine: Secondary | ICD-10-CM | POA: Diagnosis not present

## 2016-04-10 DIAGNOSIS — R17 Unspecified jaundice: Secondary | ICD-10-CM | POA: Diagnosis not present

## 2016-04-10 DIAGNOSIS — N186 End stage renal disease: Secondary | ICD-10-CM | POA: Diagnosis not present

## 2016-04-10 DIAGNOSIS — Z79899 Other long term (current) drug therapy: Secondary | ICD-10-CM | POA: Diagnosis not present

## 2016-04-10 DIAGNOSIS — Z23 Encounter for immunization: Secondary | ICD-10-CM | POA: Diagnosis not present

## 2016-04-10 DIAGNOSIS — E440 Moderate protein-calorie malnutrition: Secondary | ICD-10-CM | POA: Diagnosis not present

## 2016-04-10 DIAGNOSIS — D631 Anemia in chronic kidney disease: Secondary | ICD-10-CM | POA: Diagnosis not present

## 2016-04-10 DIAGNOSIS — D509 Iron deficiency anemia, unspecified: Secondary | ICD-10-CM | POA: Diagnosis not present

## 2016-04-11 DIAGNOSIS — D509 Iron deficiency anemia, unspecified: Secondary | ICD-10-CM | POA: Diagnosis not present

## 2016-04-11 DIAGNOSIS — D631 Anemia in chronic kidney disease: Secondary | ICD-10-CM | POA: Diagnosis not present

## 2016-04-11 DIAGNOSIS — E440 Moderate protein-calorie malnutrition: Secondary | ICD-10-CM | POA: Diagnosis not present

## 2016-04-11 DIAGNOSIS — Z79899 Other long term (current) drug therapy: Secondary | ICD-10-CM | POA: Diagnosis not present

## 2016-04-11 DIAGNOSIS — R8299 Other abnormal findings in urine: Secondary | ICD-10-CM | POA: Diagnosis not present

## 2016-04-11 DIAGNOSIS — N186 End stage renal disease: Secondary | ICD-10-CM | POA: Diagnosis not present

## 2016-04-11 DIAGNOSIS — R17 Unspecified jaundice: Secondary | ICD-10-CM | POA: Diagnosis not present

## 2016-04-11 DIAGNOSIS — Z23 Encounter for immunization: Secondary | ICD-10-CM | POA: Diagnosis not present

## 2016-04-12 DIAGNOSIS — D509 Iron deficiency anemia, unspecified: Secondary | ICD-10-CM | POA: Diagnosis not present

## 2016-04-12 DIAGNOSIS — E440 Moderate protein-calorie malnutrition: Secondary | ICD-10-CM | POA: Diagnosis not present

## 2016-04-12 DIAGNOSIS — N186 End stage renal disease: Secondary | ICD-10-CM | POA: Diagnosis not present

## 2016-04-12 DIAGNOSIS — Z23 Encounter for immunization: Secondary | ICD-10-CM | POA: Diagnosis not present

## 2016-04-12 DIAGNOSIS — R8299 Other abnormal findings in urine: Secondary | ICD-10-CM | POA: Diagnosis not present

## 2016-04-12 DIAGNOSIS — D631 Anemia in chronic kidney disease: Secondary | ICD-10-CM | POA: Diagnosis not present

## 2016-04-12 DIAGNOSIS — R17 Unspecified jaundice: Secondary | ICD-10-CM | POA: Diagnosis not present

## 2016-04-12 DIAGNOSIS — Z79899 Other long term (current) drug therapy: Secondary | ICD-10-CM | POA: Diagnosis not present

## 2016-04-13 DIAGNOSIS — Z79899 Other long term (current) drug therapy: Secondary | ICD-10-CM | POA: Diagnosis not present

## 2016-04-13 DIAGNOSIS — R17 Unspecified jaundice: Secondary | ICD-10-CM | POA: Diagnosis not present

## 2016-04-13 DIAGNOSIS — E440 Moderate protein-calorie malnutrition: Secondary | ICD-10-CM | POA: Diagnosis not present

## 2016-04-13 DIAGNOSIS — Z23 Encounter for immunization: Secondary | ICD-10-CM | POA: Diagnosis not present

## 2016-04-13 DIAGNOSIS — D631 Anemia in chronic kidney disease: Secondary | ICD-10-CM | POA: Diagnosis not present

## 2016-04-13 DIAGNOSIS — D509 Iron deficiency anemia, unspecified: Secondary | ICD-10-CM | POA: Diagnosis not present

## 2016-04-13 DIAGNOSIS — R8299 Other abnormal findings in urine: Secondary | ICD-10-CM | POA: Diagnosis not present

## 2016-04-13 DIAGNOSIS — N186 End stage renal disease: Secondary | ICD-10-CM | POA: Diagnosis not present

## 2016-04-14 DIAGNOSIS — R17 Unspecified jaundice: Secondary | ICD-10-CM | POA: Diagnosis not present

## 2016-04-14 DIAGNOSIS — E440 Moderate protein-calorie malnutrition: Secondary | ICD-10-CM | POA: Diagnosis not present

## 2016-04-14 DIAGNOSIS — D509 Iron deficiency anemia, unspecified: Secondary | ICD-10-CM | POA: Diagnosis not present

## 2016-04-14 DIAGNOSIS — R8299 Other abnormal findings in urine: Secondary | ICD-10-CM | POA: Diagnosis not present

## 2016-04-14 DIAGNOSIS — N186 End stage renal disease: Secondary | ICD-10-CM | POA: Diagnosis not present

## 2016-04-14 DIAGNOSIS — Z23 Encounter for immunization: Secondary | ICD-10-CM | POA: Diagnosis not present

## 2016-04-14 DIAGNOSIS — D631 Anemia in chronic kidney disease: Secondary | ICD-10-CM | POA: Diagnosis not present

## 2016-04-14 DIAGNOSIS — Z79899 Other long term (current) drug therapy: Secondary | ICD-10-CM | POA: Diagnosis not present

## 2016-04-15 DIAGNOSIS — Z79899 Other long term (current) drug therapy: Secondary | ICD-10-CM | POA: Diagnosis not present

## 2016-04-15 DIAGNOSIS — Z23 Encounter for immunization: Secondary | ICD-10-CM | POA: Diagnosis not present

## 2016-04-15 DIAGNOSIS — D631 Anemia in chronic kidney disease: Secondary | ICD-10-CM | POA: Diagnosis not present

## 2016-04-15 DIAGNOSIS — E440 Moderate protein-calorie malnutrition: Secondary | ICD-10-CM | POA: Diagnosis not present

## 2016-04-15 DIAGNOSIS — D509 Iron deficiency anemia, unspecified: Secondary | ICD-10-CM | POA: Diagnosis not present

## 2016-04-15 DIAGNOSIS — N186 End stage renal disease: Secondary | ICD-10-CM | POA: Diagnosis not present

## 2016-04-15 DIAGNOSIS — R17 Unspecified jaundice: Secondary | ICD-10-CM | POA: Diagnosis not present

## 2016-04-15 DIAGNOSIS — R8299 Other abnormal findings in urine: Secondary | ICD-10-CM | POA: Diagnosis not present

## 2016-04-16 DIAGNOSIS — R17 Unspecified jaundice: Secondary | ICD-10-CM | POA: Diagnosis not present

## 2016-04-16 DIAGNOSIS — D631 Anemia in chronic kidney disease: Secondary | ICD-10-CM | POA: Diagnosis not present

## 2016-04-16 DIAGNOSIS — Z23 Encounter for immunization: Secondary | ICD-10-CM | POA: Diagnosis not present

## 2016-04-16 DIAGNOSIS — E440 Moderate protein-calorie malnutrition: Secondary | ICD-10-CM | POA: Diagnosis not present

## 2016-04-16 DIAGNOSIS — D509 Iron deficiency anemia, unspecified: Secondary | ICD-10-CM | POA: Diagnosis not present

## 2016-04-16 DIAGNOSIS — R8299 Other abnormal findings in urine: Secondary | ICD-10-CM | POA: Diagnosis not present

## 2016-04-16 DIAGNOSIS — Z79899 Other long term (current) drug therapy: Secondary | ICD-10-CM | POA: Diagnosis not present

## 2016-04-16 DIAGNOSIS — N186 End stage renal disease: Secondary | ICD-10-CM | POA: Diagnosis not present

## 2016-04-17 DIAGNOSIS — Z23 Encounter for immunization: Secondary | ICD-10-CM | POA: Diagnosis not present

## 2016-04-17 DIAGNOSIS — Z79899 Other long term (current) drug therapy: Secondary | ICD-10-CM | POA: Diagnosis not present

## 2016-04-17 DIAGNOSIS — D509 Iron deficiency anemia, unspecified: Secondary | ICD-10-CM | POA: Diagnosis not present

## 2016-04-17 DIAGNOSIS — D631 Anemia in chronic kidney disease: Secondary | ICD-10-CM | POA: Diagnosis not present

## 2016-04-17 DIAGNOSIS — N186 End stage renal disease: Secondary | ICD-10-CM | POA: Diagnosis not present

## 2016-04-17 DIAGNOSIS — R8299 Other abnormal findings in urine: Secondary | ICD-10-CM | POA: Diagnosis not present

## 2016-04-17 DIAGNOSIS — E440 Moderate protein-calorie malnutrition: Secondary | ICD-10-CM | POA: Diagnosis not present

## 2016-04-17 DIAGNOSIS — R17 Unspecified jaundice: Secondary | ICD-10-CM | POA: Diagnosis not present

## 2016-04-18 DIAGNOSIS — D631 Anemia in chronic kidney disease: Secondary | ICD-10-CM | POA: Diagnosis not present

## 2016-04-18 DIAGNOSIS — Z23 Encounter for immunization: Secondary | ICD-10-CM | POA: Diagnosis not present

## 2016-04-18 DIAGNOSIS — E440 Moderate protein-calorie malnutrition: Secondary | ICD-10-CM | POA: Diagnosis not present

## 2016-04-18 DIAGNOSIS — R17 Unspecified jaundice: Secondary | ICD-10-CM | POA: Diagnosis not present

## 2016-04-18 DIAGNOSIS — Z79899 Other long term (current) drug therapy: Secondary | ICD-10-CM | POA: Diagnosis not present

## 2016-04-18 DIAGNOSIS — D509 Iron deficiency anemia, unspecified: Secondary | ICD-10-CM | POA: Diagnosis not present

## 2016-04-18 DIAGNOSIS — N186 End stage renal disease: Secondary | ICD-10-CM | POA: Diagnosis not present

## 2016-04-18 DIAGNOSIS — R8299 Other abnormal findings in urine: Secondary | ICD-10-CM | POA: Diagnosis not present

## 2016-04-19 DIAGNOSIS — Z23 Encounter for immunization: Secondary | ICD-10-CM | POA: Diagnosis not present

## 2016-04-19 DIAGNOSIS — Z992 Dependence on renal dialysis: Secondary | ICD-10-CM | POA: Diagnosis not present

## 2016-04-19 DIAGNOSIS — N186 End stage renal disease: Secondary | ICD-10-CM | POA: Diagnosis not present

## 2016-04-19 DIAGNOSIS — E440 Moderate protein-calorie malnutrition: Secondary | ICD-10-CM | POA: Diagnosis not present

## 2016-04-19 DIAGNOSIS — E1122 Type 2 diabetes mellitus with diabetic chronic kidney disease: Secondary | ICD-10-CM | POA: Diagnosis not present

## 2016-04-19 DIAGNOSIS — D631 Anemia in chronic kidney disease: Secondary | ICD-10-CM | POA: Diagnosis not present

## 2016-04-19 DIAGNOSIS — Z79899 Other long term (current) drug therapy: Secondary | ICD-10-CM | POA: Diagnosis not present

## 2016-04-19 DIAGNOSIS — R8299 Other abnormal findings in urine: Secondary | ICD-10-CM | POA: Diagnosis not present

## 2016-04-19 DIAGNOSIS — R17 Unspecified jaundice: Secondary | ICD-10-CM | POA: Diagnosis not present

## 2016-04-19 DIAGNOSIS — D509 Iron deficiency anemia, unspecified: Secondary | ICD-10-CM | POA: Diagnosis not present

## 2016-04-20 DIAGNOSIS — D631 Anemia in chronic kidney disease: Secondary | ICD-10-CM | POA: Diagnosis not present

## 2016-04-20 DIAGNOSIS — N186 End stage renal disease: Secondary | ICD-10-CM | POA: Diagnosis not present

## 2016-04-20 DIAGNOSIS — D509 Iron deficiency anemia, unspecified: Secondary | ICD-10-CM | POA: Diagnosis not present

## 2016-04-20 DIAGNOSIS — E440 Moderate protein-calorie malnutrition: Secondary | ICD-10-CM | POA: Diagnosis not present

## 2016-04-20 DIAGNOSIS — R17 Unspecified jaundice: Secondary | ICD-10-CM | POA: Diagnosis not present

## 2016-04-20 DIAGNOSIS — N2581 Secondary hyperparathyroidism of renal origin: Secondary | ICD-10-CM | POA: Diagnosis not present

## 2016-04-20 DIAGNOSIS — R8299 Other abnormal findings in urine: Secondary | ICD-10-CM | POA: Diagnosis not present

## 2016-04-20 DIAGNOSIS — Z79899 Other long term (current) drug therapy: Secondary | ICD-10-CM | POA: Diagnosis not present

## 2016-04-21 DIAGNOSIS — D509 Iron deficiency anemia, unspecified: Secondary | ICD-10-CM | POA: Diagnosis not present

## 2016-04-21 DIAGNOSIS — N186 End stage renal disease: Secondary | ICD-10-CM | POA: Diagnosis not present

## 2016-04-21 DIAGNOSIS — Z79899 Other long term (current) drug therapy: Secondary | ICD-10-CM | POA: Diagnosis not present

## 2016-04-21 DIAGNOSIS — D631 Anemia in chronic kidney disease: Secondary | ICD-10-CM | POA: Diagnosis not present

## 2016-04-21 DIAGNOSIS — R17 Unspecified jaundice: Secondary | ICD-10-CM | POA: Diagnosis not present

## 2016-04-21 DIAGNOSIS — N2581 Secondary hyperparathyroidism of renal origin: Secondary | ICD-10-CM | POA: Diagnosis not present

## 2016-04-21 DIAGNOSIS — E440 Moderate protein-calorie malnutrition: Secondary | ICD-10-CM | POA: Diagnosis not present

## 2016-04-21 DIAGNOSIS — R8299 Other abnormal findings in urine: Secondary | ICD-10-CM | POA: Diagnosis not present

## 2016-04-22 DIAGNOSIS — E440 Moderate protein-calorie malnutrition: Secondary | ICD-10-CM | POA: Diagnosis not present

## 2016-04-22 DIAGNOSIS — N186 End stage renal disease: Secondary | ICD-10-CM | POA: Diagnosis not present

## 2016-04-22 DIAGNOSIS — N2581 Secondary hyperparathyroidism of renal origin: Secondary | ICD-10-CM | POA: Diagnosis not present

## 2016-04-22 DIAGNOSIS — R17 Unspecified jaundice: Secondary | ICD-10-CM | POA: Diagnosis not present

## 2016-04-22 DIAGNOSIS — D631 Anemia in chronic kidney disease: Secondary | ICD-10-CM | POA: Diagnosis not present

## 2016-04-22 DIAGNOSIS — Z79899 Other long term (current) drug therapy: Secondary | ICD-10-CM | POA: Diagnosis not present

## 2016-04-22 DIAGNOSIS — D509 Iron deficiency anemia, unspecified: Secondary | ICD-10-CM | POA: Diagnosis not present

## 2016-04-22 DIAGNOSIS — R8299 Other abnormal findings in urine: Secondary | ICD-10-CM | POA: Diagnosis not present

## 2016-04-23 DIAGNOSIS — R17 Unspecified jaundice: Secondary | ICD-10-CM | POA: Diagnosis not present

## 2016-04-23 DIAGNOSIS — R8299 Other abnormal findings in urine: Secondary | ICD-10-CM | POA: Diagnosis not present

## 2016-04-23 DIAGNOSIS — Z79899 Other long term (current) drug therapy: Secondary | ICD-10-CM | POA: Diagnosis not present

## 2016-04-23 DIAGNOSIS — E440 Moderate protein-calorie malnutrition: Secondary | ICD-10-CM | POA: Diagnosis not present

## 2016-04-23 DIAGNOSIS — N186 End stage renal disease: Secondary | ICD-10-CM | POA: Diagnosis not present

## 2016-04-23 DIAGNOSIS — N2581 Secondary hyperparathyroidism of renal origin: Secondary | ICD-10-CM | POA: Diagnosis not present

## 2016-04-23 DIAGNOSIS — D631 Anemia in chronic kidney disease: Secondary | ICD-10-CM | POA: Diagnosis not present

## 2016-04-23 DIAGNOSIS — D509 Iron deficiency anemia, unspecified: Secondary | ICD-10-CM | POA: Diagnosis not present

## 2016-04-24 DIAGNOSIS — R17 Unspecified jaundice: Secondary | ICD-10-CM | POA: Diagnosis not present

## 2016-04-24 DIAGNOSIS — E440 Moderate protein-calorie malnutrition: Secondary | ICD-10-CM | POA: Diagnosis not present

## 2016-04-24 DIAGNOSIS — Z79899 Other long term (current) drug therapy: Secondary | ICD-10-CM | POA: Diagnosis not present

## 2016-04-24 DIAGNOSIS — N186 End stage renal disease: Secondary | ICD-10-CM | POA: Diagnosis not present

## 2016-04-24 DIAGNOSIS — N2581 Secondary hyperparathyroidism of renal origin: Secondary | ICD-10-CM | POA: Diagnosis not present

## 2016-04-24 DIAGNOSIS — R8299 Other abnormal findings in urine: Secondary | ICD-10-CM | POA: Diagnosis not present

## 2016-04-24 DIAGNOSIS — D631 Anemia in chronic kidney disease: Secondary | ICD-10-CM | POA: Diagnosis not present

## 2016-04-24 DIAGNOSIS — D509 Iron deficiency anemia, unspecified: Secondary | ICD-10-CM | POA: Diagnosis not present

## 2016-04-25 DIAGNOSIS — E440 Moderate protein-calorie malnutrition: Secondary | ICD-10-CM | POA: Diagnosis not present

## 2016-04-25 DIAGNOSIS — D509 Iron deficiency anemia, unspecified: Secondary | ICD-10-CM | POA: Diagnosis not present

## 2016-04-25 DIAGNOSIS — R17 Unspecified jaundice: Secondary | ICD-10-CM | POA: Diagnosis not present

## 2016-04-25 DIAGNOSIS — Z79899 Other long term (current) drug therapy: Secondary | ICD-10-CM | POA: Diagnosis not present

## 2016-04-25 DIAGNOSIS — D631 Anemia in chronic kidney disease: Secondary | ICD-10-CM | POA: Diagnosis not present

## 2016-04-25 DIAGNOSIS — N2581 Secondary hyperparathyroidism of renal origin: Secondary | ICD-10-CM | POA: Diagnosis not present

## 2016-04-25 DIAGNOSIS — N186 End stage renal disease: Secondary | ICD-10-CM | POA: Diagnosis not present

## 2016-04-25 DIAGNOSIS — R8299 Other abnormal findings in urine: Secondary | ICD-10-CM | POA: Diagnosis not present

## 2016-04-26 ENCOUNTER — Other Ambulatory Visit: Payer: Self-pay | Admitting: Internal Medicine

## 2016-04-26 ENCOUNTER — Other Ambulatory Visit: Payer: Self-pay | Admitting: Cardiology

## 2016-04-26 DIAGNOSIS — D631 Anemia in chronic kidney disease: Secondary | ICD-10-CM | POA: Diagnosis not present

## 2016-04-26 DIAGNOSIS — N186 End stage renal disease: Secondary | ICD-10-CM | POA: Diagnosis not present

## 2016-04-26 DIAGNOSIS — R17 Unspecified jaundice: Secondary | ICD-10-CM | POA: Diagnosis not present

## 2016-04-26 DIAGNOSIS — Z79899 Other long term (current) drug therapy: Secondary | ICD-10-CM | POA: Diagnosis not present

## 2016-04-26 DIAGNOSIS — N2581 Secondary hyperparathyroidism of renal origin: Secondary | ICD-10-CM | POA: Diagnosis not present

## 2016-04-26 DIAGNOSIS — D509 Iron deficiency anemia, unspecified: Secondary | ICD-10-CM | POA: Diagnosis not present

## 2016-04-26 DIAGNOSIS — R8299 Other abnormal findings in urine: Secondary | ICD-10-CM | POA: Diagnosis not present

## 2016-04-26 DIAGNOSIS — E440 Moderate protein-calorie malnutrition: Secondary | ICD-10-CM | POA: Diagnosis not present

## 2016-04-27 DIAGNOSIS — R17 Unspecified jaundice: Secondary | ICD-10-CM | POA: Diagnosis not present

## 2016-04-27 DIAGNOSIS — N186 End stage renal disease: Secondary | ICD-10-CM | POA: Diagnosis not present

## 2016-04-27 DIAGNOSIS — E440 Moderate protein-calorie malnutrition: Secondary | ICD-10-CM | POA: Diagnosis not present

## 2016-04-27 DIAGNOSIS — Z79899 Other long term (current) drug therapy: Secondary | ICD-10-CM | POA: Diagnosis not present

## 2016-04-27 DIAGNOSIS — R8299 Other abnormal findings in urine: Secondary | ICD-10-CM | POA: Diagnosis not present

## 2016-04-27 DIAGNOSIS — D509 Iron deficiency anemia, unspecified: Secondary | ICD-10-CM | POA: Diagnosis not present

## 2016-04-27 DIAGNOSIS — D631 Anemia in chronic kidney disease: Secondary | ICD-10-CM | POA: Diagnosis not present

## 2016-04-27 DIAGNOSIS — N2581 Secondary hyperparathyroidism of renal origin: Secondary | ICD-10-CM | POA: Diagnosis not present

## 2016-04-28 DIAGNOSIS — N186 End stage renal disease: Secondary | ICD-10-CM | POA: Diagnosis not present

## 2016-04-28 DIAGNOSIS — D509 Iron deficiency anemia, unspecified: Secondary | ICD-10-CM | POA: Diagnosis not present

## 2016-04-28 DIAGNOSIS — R8299 Other abnormal findings in urine: Secondary | ICD-10-CM | POA: Diagnosis not present

## 2016-04-28 DIAGNOSIS — D631 Anemia in chronic kidney disease: Secondary | ICD-10-CM | POA: Diagnosis not present

## 2016-04-28 DIAGNOSIS — R17 Unspecified jaundice: Secondary | ICD-10-CM | POA: Diagnosis not present

## 2016-04-28 DIAGNOSIS — E440 Moderate protein-calorie malnutrition: Secondary | ICD-10-CM | POA: Diagnosis not present

## 2016-04-28 DIAGNOSIS — N2581 Secondary hyperparathyroidism of renal origin: Secondary | ICD-10-CM | POA: Diagnosis not present

## 2016-04-28 DIAGNOSIS — Z79899 Other long term (current) drug therapy: Secondary | ICD-10-CM | POA: Diagnosis not present

## 2016-04-29 DIAGNOSIS — N2581 Secondary hyperparathyroidism of renal origin: Secondary | ICD-10-CM | POA: Diagnosis not present

## 2016-04-29 DIAGNOSIS — N186 End stage renal disease: Secondary | ICD-10-CM | POA: Diagnosis not present

## 2016-04-29 DIAGNOSIS — E440 Moderate protein-calorie malnutrition: Secondary | ICD-10-CM | POA: Diagnosis not present

## 2016-04-29 DIAGNOSIS — Z79899 Other long term (current) drug therapy: Secondary | ICD-10-CM | POA: Diagnosis not present

## 2016-04-29 DIAGNOSIS — R17 Unspecified jaundice: Secondary | ICD-10-CM | POA: Diagnosis not present

## 2016-04-29 DIAGNOSIS — D509 Iron deficiency anemia, unspecified: Secondary | ICD-10-CM | POA: Diagnosis not present

## 2016-04-29 DIAGNOSIS — D631 Anemia in chronic kidney disease: Secondary | ICD-10-CM | POA: Diagnosis not present

## 2016-04-29 DIAGNOSIS — R8299 Other abnormal findings in urine: Secondary | ICD-10-CM | POA: Diagnosis not present

## 2016-04-30 DIAGNOSIS — Z79899 Other long term (current) drug therapy: Secondary | ICD-10-CM | POA: Diagnosis not present

## 2016-04-30 DIAGNOSIS — R17 Unspecified jaundice: Secondary | ICD-10-CM | POA: Diagnosis not present

## 2016-04-30 DIAGNOSIS — D631 Anemia in chronic kidney disease: Secondary | ICD-10-CM | POA: Diagnosis not present

## 2016-04-30 DIAGNOSIS — E440 Moderate protein-calorie malnutrition: Secondary | ICD-10-CM | POA: Diagnosis not present

## 2016-04-30 DIAGNOSIS — D509 Iron deficiency anemia, unspecified: Secondary | ICD-10-CM | POA: Diagnosis not present

## 2016-04-30 DIAGNOSIS — R8299 Other abnormal findings in urine: Secondary | ICD-10-CM | POA: Diagnosis not present

## 2016-04-30 DIAGNOSIS — N186 End stage renal disease: Secondary | ICD-10-CM | POA: Diagnosis not present

## 2016-04-30 DIAGNOSIS — N2581 Secondary hyperparathyroidism of renal origin: Secondary | ICD-10-CM | POA: Diagnosis not present

## 2016-05-01 DIAGNOSIS — Z79899 Other long term (current) drug therapy: Secondary | ICD-10-CM | POA: Diagnosis not present

## 2016-05-01 DIAGNOSIS — N2581 Secondary hyperparathyroidism of renal origin: Secondary | ICD-10-CM | POA: Diagnosis not present

## 2016-05-01 DIAGNOSIS — E440 Moderate protein-calorie malnutrition: Secondary | ICD-10-CM | POA: Diagnosis not present

## 2016-05-01 DIAGNOSIS — R8299 Other abnormal findings in urine: Secondary | ICD-10-CM | POA: Diagnosis not present

## 2016-05-01 DIAGNOSIS — R17 Unspecified jaundice: Secondary | ICD-10-CM | POA: Diagnosis not present

## 2016-05-01 DIAGNOSIS — D631 Anemia in chronic kidney disease: Secondary | ICD-10-CM | POA: Diagnosis not present

## 2016-05-01 DIAGNOSIS — N186 End stage renal disease: Secondary | ICD-10-CM | POA: Diagnosis not present

## 2016-05-01 DIAGNOSIS — D509 Iron deficiency anemia, unspecified: Secondary | ICD-10-CM | POA: Diagnosis not present

## 2016-05-02 DIAGNOSIS — R17 Unspecified jaundice: Secondary | ICD-10-CM | POA: Diagnosis not present

## 2016-05-02 DIAGNOSIS — E440 Moderate protein-calorie malnutrition: Secondary | ICD-10-CM | POA: Diagnosis not present

## 2016-05-02 DIAGNOSIS — N186 End stage renal disease: Secondary | ICD-10-CM | POA: Diagnosis not present

## 2016-05-02 DIAGNOSIS — N2581 Secondary hyperparathyroidism of renal origin: Secondary | ICD-10-CM | POA: Diagnosis not present

## 2016-05-02 DIAGNOSIS — D631 Anemia in chronic kidney disease: Secondary | ICD-10-CM | POA: Diagnosis not present

## 2016-05-02 DIAGNOSIS — D509 Iron deficiency anemia, unspecified: Secondary | ICD-10-CM | POA: Diagnosis not present

## 2016-05-02 DIAGNOSIS — R8299 Other abnormal findings in urine: Secondary | ICD-10-CM | POA: Diagnosis not present

## 2016-05-02 DIAGNOSIS — Z79899 Other long term (current) drug therapy: Secondary | ICD-10-CM | POA: Diagnosis not present

## 2016-05-03 DIAGNOSIS — D631 Anemia in chronic kidney disease: Secondary | ICD-10-CM | POA: Diagnosis not present

## 2016-05-03 DIAGNOSIS — R8299 Other abnormal findings in urine: Secondary | ICD-10-CM | POA: Diagnosis not present

## 2016-05-03 DIAGNOSIS — D509 Iron deficiency anemia, unspecified: Secondary | ICD-10-CM | POA: Diagnosis not present

## 2016-05-03 DIAGNOSIS — E440 Moderate protein-calorie malnutrition: Secondary | ICD-10-CM | POA: Diagnosis not present

## 2016-05-03 DIAGNOSIS — R17 Unspecified jaundice: Secondary | ICD-10-CM | POA: Diagnosis not present

## 2016-05-03 DIAGNOSIS — Z79899 Other long term (current) drug therapy: Secondary | ICD-10-CM | POA: Diagnosis not present

## 2016-05-03 DIAGNOSIS — N186 End stage renal disease: Secondary | ICD-10-CM | POA: Diagnosis not present

## 2016-05-03 DIAGNOSIS — N2581 Secondary hyperparathyroidism of renal origin: Secondary | ICD-10-CM | POA: Diagnosis not present

## 2016-05-04 DIAGNOSIS — Z79899 Other long term (current) drug therapy: Secondary | ICD-10-CM | POA: Diagnosis not present

## 2016-05-04 DIAGNOSIS — N2581 Secondary hyperparathyroidism of renal origin: Secondary | ICD-10-CM | POA: Diagnosis not present

## 2016-05-04 DIAGNOSIS — D631 Anemia in chronic kidney disease: Secondary | ICD-10-CM | POA: Diagnosis not present

## 2016-05-04 DIAGNOSIS — D509 Iron deficiency anemia, unspecified: Secondary | ICD-10-CM | POA: Diagnosis not present

## 2016-05-04 DIAGNOSIS — R8299 Other abnormal findings in urine: Secondary | ICD-10-CM | POA: Diagnosis not present

## 2016-05-04 DIAGNOSIS — E440 Moderate protein-calorie malnutrition: Secondary | ICD-10-CM | POA: Diagnosis not present

## 2016-05-04 DIAGNOSIS — N186 End stage renal disease: Secondary | ICD-10-CM | POA: Diagnosis not present

## 2016-05-04 DIAGNOSIS — R17 Unspecified jaundice: Secondary | ICD-10-CM | POA: Diagnosis not present

## 2016-05-05 DIAGNOSIS — N186 End stage renal disease: Secondary | ICD-10-CM | POA: Diagnosis not present

## 2016-05-05 DIAGNOSIS — D509 Iron deficiency anemia, unspecified: Secondary | ICD-10-CM | POA: Diagnosis not present

## 2016-05-05 DIAGNOSIS — R17 Unspecified jaundice: Secondary | ICD-10-CM | POA: Diagnosis not present

## 2016-05-05 DIAGNOSIS — D631 Anemia in chronic kidney disease: Secondary | ICD-10-CM | POA: Diagnosis not present

## 2016-05-05 DIAGNOSIS — R8299 Other abnormal findings in urine: Secondary | ICD-10-CM | POA: Diagnosis not present

## 2016-05-05 DIAGNOSIS — Z79899 Other long term (current) drug therapy: Secondary | ICD-10-CM | POA: Diagnosis not present

## 2016-05-05 DIAGNOSIS — N2581 Secondary hyperparathyroidism of renal origin: Secondary | ICD-10-CM | POA: Diagnosis not present

## 2016-05-05 DIAGNOSIS — E440 Moderate protein-calorie malnutrition: Secondary | ICD-10-CM | POA: Diagnosis not present

## 2016-05-06 DIAGNOSIS — R8299 Other abnormal findings in urine: Secondary | ICD-10-CM | POA: Diagnosis not present

## 2016-05-06 DIAGNOSIS — N2581 Secondary hyperparathyroidism of renal origin: Secondary | ICD-10-CM | POA: Diagnosis not present

## 2016-05-06 DIAGNOSIS — N186 End stage renal disease: Secondary | ICD-10-CM | POA: Diagnosis not present

## 2016-05-06 DIAGNOSIS — Z79899 Other long term (current) drug therapy: Secondary | ICD-10-CM | POA: Diagnosis not present

## 2016-05-06 DIAGNOSIS — E440 Moderate protein-calorie malnutrition: Secondary | ICD-10-CM | POA: Diagnosis not present

## 2016-05-06 DIAGNOSIS — R17 Unspecified jaundice: Secondary | ICD-10-CM | POA: Diagnosis not present

## 2016-05-06 DIAGNOSIS — D631 Anemia in chronic kidney disease: Secondary | ICD-10-CM | POA: Diagnosis not present

## 2016-05-06 DIAGNOSIS — D509 Iron deficiency anemia, unspecified: Secondary | ICD-10-CM | POA: Diagnosis not present

## 2016-05-07 DIAGNOSIS — N2581 Secondary hyperparathyroidism of renal origin: Secondary | ICD-10-CM | POA: Diagnosis not present

## 2016-05-07 DIAGNOSIS — E440 Moderate protein-calorie malnutrition: Secondary | ICD-10-CM | POA: Diagnosis not present

## 2016-05-07 DIAGNOSIS — Z79899 Other long term (current) drug therapy: Secondary | ICD-10-CM | POA: Diagnosis not present

## 2016-05-07 DIAGNOSIS — D631 Anemia in chronic kidney disease: Secondary | ICD-10-CM | POA: Diagnosis not present

## 2016-05-07 DIAGNOSIS — R8299 Other abnormal findings in urine: Secondary | ICD-10-CM | POA: Diagnosis not present

## 2016-05-07 DIAGNOSIS — R17 Unspecified jaundice: Secondary | ICD-10-CM | POA: Diagnosis not present

## 2016-05-07 DIAGNOSIS — D509 Iron deficiency anemia, unspecified: Secondary | ICD-10-CM | POA: Diagnosis not present

## 2016-05-07 DIAGNOSIS — N186 End stage renal disease: Secondary | ICD-10-CM | POA: Diagnosis not present

## 2016-05-08 DIAGNOSIS — Z79899 Other long term (current) drug therapy: Secondary | ICD-10-CM | POA: Diagnosis not present

## 2016-05-08 DIAGNOSIS — D509 Iron deficiency anemia, unspecified: Secondary | ICD-10-CM | POA: Diagnosis not present

## 2016-05-08 DIAGNOSIS — N2581 Secondary hyperparathyroidism of renal origin: Secondary | ICD-10-CM | POA: Diagnosis not present

## 2016-05-08 DIAGNOSIS — D631 Anemia in chronic kidney disease: Secondary | ICD-10-CM | POA: Diagnosis not present

## 2016-05-08 DIAGNOSIS — R17 Unspecified jaundice: Secondary | ICD-10-CM | POA: Diagnosis not present

## 2016-05-08 DIAGNOSIS — N186 End stage renal disease: Secondary | ICD-10-CM | POA: Diagnosis not present

## 2016-05-08 DIAGNOSIS — R8299 Other abnormal findings in urine: Secondary | ICD-10-CM | POA: Diagnosis not present

## 2016-05-08 DIAGNOSIS — E440 Moderate protein-calorie malnutrition: Secondary | ICD-10-CM | POA: Diagnosis not present

## 2016-05-09 DIAGNOSIS — D509 Iron deficiency anemia, unspecified: Secondary | ICD-10-CM | POA: Diagnosis not present

## 2016-05-09 DIAGNOSIS — N186 End stage renal disease: Secondary | ICD-10-CM | POA: Diagnosis not present

## 2016-05-09 DIAGNOSIS — E440 Moderate protein-calorie malnutrition: Secondary | ICD-10-CM | POA: Diagnosis not present

## 2016-05-09 DIAGNOSIS — Z79899 Other long term (current) drug therapy: Secondary | ICD-10-CM | POA: Diagnosis not present

## 2016-05-09 DIAGNOSIS — N2581 Secondary hyperparathyroidism of renal origin: Secondary | ICD-10-CM | POA: Diagnosis not present

## 2016-05-09 DIAGNOSIS — R8299 Other abnormal findings in urine: Secondary | ICD-10-CM | POA: Diagnosis not present

## 2016-05-09 DIAGNOSIS — R17 Unspecified jaundice: Secondary | ICD-10-CM | POA: Diagnosis not present

## 2016-05-09 DIAGNOSIS — D631 Anemia in chronic kidney disease: Secondary | ICD-10-CM | POA: Diagnosis not present

## 2016-05-10 DIAGNOSIS — E440 Moderate protein-calorie malnutrition: Secondary | ICD-10-CM | POA: Diagnosis not present

## 2016-05-10 DIAGNOSIS — D631 Anemia in chronic kidney disease: Secondary | ICD-10-CM | POA: Diagnosis not present

## 2016-05-10 DIAGNOSIS — R8299 Other abnormal findings in urine: Secondary | ICD-10-CM | POA: Diagnosis not present

## 2016-05-10 DIAGNOSIS — R17 Unspecified jaundice: Secondary | ICD-10-CM | POA: Diagnosis not present

## 2016-05-10 DIAGNOSIS — Z79899 Other long term (current) drug therapy: Secondary | ICD-10-CM | POA: Diagnosis not present

## 2016-05-10 DIAGNOSIS — N2581 Secondary hyperparathyroidism of renal origin: Secondary | ICD-10-CM | POA: Diagnosis not present

## 2016-05-10 DIAGNOSIS — N186 End stage renal disease: Secondary | ICD-10-CM | POA: Diagnosis not present

## 2016-05-10 DIAGNOSIS — D509 Iron deficiency anemia, unspecified: Secondary | ICD-10-CM | POA: Diagnosis not present

## 2016-05-11 DIAGNOSIS — N2581 Secondary hyperparathyroidism of renal origin: Secondary | ICD-10-CM | POA: Diagnosis not present

## 2016-05-11 DIAGNOSIS — Z79899 Other long term (current) drug therapy: Secondary | ICD-10-CM | POA: Diagnosis not present

## 2016-05-11 DIAGNOSIS — D509 Iron deficiency anemia, unspecified: Secondary | ICD-10-CM | POA: Diagnosis not present

## 2016-05-11 DIAGNOSIS — R17 Unspecified jaundice: Secondary | ICD-10-CM | POA: Diagnosis not present

## 2016-05-11 DIAGNOSIS — N186 End stage renal disease: Secondary | ICD-10-CM | POA: Diagnosis not present

## 2016-05-11 DIAGNOSIS — D631 Anemia in chronic kidney disease: Secondary | ICD-10-CM | POA: Diagnosis not present

## 2016-05-11 DIAGNOSIS — R8299 Other abnormal findings in urine: Secondary | ICD-10-CM | POA: Diagnosis not present

## 2016-05-11 DIAGNOSIS — E440 Moderate protein-calorie malnutrition: Secondary | ICD-10-CM | POA: Diagnosis not present

## 2016-05-12 DIAGNOSIS — D509 Iron deficiency anemia, unspecified: Secondary | ICD-10-CM | POA: Diagnosis not present

## 2016-05-12 DIAGNOSIS — N2581 Secondary hyperparathyroidism of renal origin: Secondary | ICD-10-CM | POA: Diagnosis not present

## 2016-05-12 DIAGNOSIS — E440 Moderate protein-calorie malnutrition: Secondary | ICD-10-CM | POA: Diagnosis not present

## 2016-05-12 DIAGNOSIS — Z79899 Other long term (current) drug therapy: Secondary | ICD-10-CM | POA: Diagnosis not present

## 2016-05-12 DIAGNOSIS — N186 End stage renal disease: Secondary | ICD-10-CM | POA: Diagnosis not present

## 2016-05-12 DIAGNOSIS — R8299 Other abnormal findings in urine: Secondary | ICD-10-CM | POA: Diagnosis not present

## 2016-05-12 DIAGNOSIS — R17 Unspecified jaundice: Secondary | ICD-10-CM | POA: Diagnosis not present

## 2016-05-12 DIAGNOSIS — D631 Anemia in chronic kidney disease: Secondary | ICD-10-CM | POA: Diagnosis not present

## 2016-05-13 DIAGNOSIS — R17 Unspecified jaundice: Secondary | ICD-10-CM | POA: Diagnosis not present

## 2016-05-13 DIAGNOSIS — R8299 Other abnormal findings in urine: Secondary | ICD-10-CM | POA: Diagnosis not present

## 2016-05-13 DIAGNOSIS — N186 End stage renal disease: Secondary | ICD-10-CM | POA: Diagnosis not present

## 2016-05-13 DIAGNOSIS — D509 Iron deficiency anemia, unspecified: Secondary | ICD-10-CM | POA: Diagnosis not present

## 2016-05-13 DIAGNOSIS — N2581 Secondary hyperparathyroidism of renal origin: Secondary | ICD-10-CM | POA: Diagnosis not present

## 2016-05-13 DIAGNOSIS — Z79899 Other long term (current) drug therapy: Secondary | ICD-10-CM | POA: Diagnosis not present

## 2016-05-13 DIAGNOSIS — E440 Moderate protein-calorie malnutrition: Secondary | ICD-10-CM | POA: Diagnosis not present

## 2016-05-13 DIAGNOSIS — D631 Anemia in chronic kidney disease: Secondary | ICD-10-CM | POA: Diagnosis not present

## 2016-05-14 DIAGNOSIS — Z79899 Other long term (current) drug therapy: Secondary | ICD-10-CM | POA: Diagnosis not present

## 2016-05-14 DIAGNOSIS — D631 Anemia in chronic kidney disease: Secondary | ICD-10-CM | POA: Diagnosis not present

## 2016-05-14 DIAGNOSIS — R17 Unspecified jaundice: Secondary | ICD-10-CM | POA: Diagnosis not present

## 2016-05-14 DIAGNOSIS — E440 Moderate protein-calorie malnutrition: Secondary | ICD-10-CM | POA: Diagnosis not present

## 2016-05-14 DIAGNOSIS — D509 Iron deficiency anemia, unspecified: Secondary | ICD-10-CM | POA: Diagnosis not present

## 2016-05-14 DIAGNOSIS — N186 End stage renal disease: Secondary | ICD-10-CM | POA: Diagnosis not present

## 2016-05-14 DIAGNOSIS — R8299 Other abnormal findings in urine: Secondary | ICD-10-CM | POA: Diagnosis not present

## 2016-05-14 DIAGNOSIS — N2581 Secondary hyperparathyroidism of renal origin: Secondary | ICD-10-CM | POA: Diagnosis not present

## 2016-05-15 DIAGNOSIS — N2581 Secondary hyperparathyroidism of renal origin: Secondary | ICD-10-CM | POA: Diagnosis not present

## 2016-05-15 DIAGNOSIS — Z79899 Other long term (current) drug therapy: Secondary | ICD-10-CM | POA: Diagnosis not present

## 2016-05-15 DIAGNOSIS — E440 Moderate protein-calorie malnutrition: Secondary | ICD-10-CM | POA: Diagnosis not present

## 2016-05-15 DIAGNOSIS — D509 Iron deficiency anemia, unspecified: Secondary | ICD-10-CM | POA: Diagnosis not present

## 2016-05-15 DIAGNOSIS — N186 End stage renal disease: Secondary | ICD-10-CM | POA: Diagnosis not present

## 2016-05-15 DIAGNOSIS — R8299 Other abnormal findings in urine: Secondary | ICD-10-CM | POA: Diagnosis not present

## 2016-05-15 DIAGNOSIS — D631 Anemia in chronic kidney disease: Secondary | ICD-10-CM | POA: Diagnosis not present

## 2016-05-15 DIAGNOSIS — R17 Unspecified jaundice: Secondary | ICD-10-CM | POA: Diagnosis not present

## 2016-05-16 DIAGNOSIS — D631 Anemia in chronic kidney disease: Secondary | ICD-10-CM | POA: Diagnosis not present

## 2016-05-16 DIAGNOSIS — N186 End stage renal disease: Secondary | ICD-10-CM | POA: Diagnosis not present

## 2016-05-16 DIAGNOSIS — Z79899 Other long term (current) drug therapy: Secondary | ICD-10-CM | POA: Diagnosis not present

## 2016-05-16 DIAGNOSIS — D509 Iron deficiency anemia, unspecified: Secondary | ICD-10-CM | POA: Diagnosis not present

## 2016-05-16 DIAGNOSIS — R17 Unspecified jaundice: Secondary | ICD-10-CM | POA: Diagnosis not present

## 2016-05-16 DIAGNOSIS — E440 Moderate protein-calorie malnutrition: Secondary | ICD-10-CM | POA: Diagnosis not present

## 2016-05-16 DIAGNOSIS — R8299 Other abnormal findings in urine: Secondary | ICD-10-CM | POA: Diagnosis not present

## 2016-05-16 DIAGNOSIS — N2581 Secondary hyperparathyroidism of renal origin: Secondary | ICD-10-CM | POA: Diagnosis not present

## 2016-05-17 DIAGNOSIS — R8299 Other abnormal findings in urine: Secondary | ICD-10-CM | POA: Diagnosis not present

## 2016-05-17 DIAGNOSIS — D509 Iron deficiency anemia, unspecified: Secondary | ICD-10-CM | POA: Diagnosis not present

## 2016-05-17 DIAGNOSIS — R17 Unspecified jaundice: Secondary | ICD-10-CM | POA: Diagnosis not present

## 2016-05-17 DIAGNOSIS — Z79899 Other long term (current) drug therapy: Secondary | ICD-10-CM | POA: Diagnosis not present

## 2016-05-17 DIAGNOSIS — N186 End stage renal disease: Secondary | ICD-10-CM | POA: Diagnosis not present

## 2016-05-17 DIAGNOSIS — N2581 Secondary hyperparathyroidism of renal origin: Secondary | ICD-10-CM | POA: Diagnosis not present

## 2016-05-17 DIAGNOSIS — E440 Moderate protein-calorie malnutrition: Secondary | ICD-10-CM | POA: Diagnosis not present

## 2016-05-17 DIAGNOSIS — D631 Anemia in chronic kidney disease: Secondary | ICD-10-CM | POA: Diagnosis not present

## 2016-05-18 DIAGNOSIS — N2581 Secondary hyperparathyroidism of renal origin: Secondary | ICD-10-CM | POA: Diagnosis not present

## 2016-05-18 DIAGNOSIS — Z79899 Other long term (current) drug therapy: Secondary | ICD-10-CM | POA: Diagnosis not present

## 2016-05-18 DIAGNOSIS — N186 End stage renal disease: Secondary | ICD-10-CM | POA: Diagnosis not present

## 2016-05-18 DIAGNOSIS — R17 Unspecified jaundice: Secondary | ICD-10-CM | POA: Diagnosis not present

## 2016-05-18 DIAGNOSIS — D631 Anemia in chronic kidney disease: Secondary | ICD-10-CM | POA: Diagnosis not present

## 2016-05-18 DIAGNOSIS — R8299 Other abnormal findings in urine: Secondary | ICD-10-CM | POA: Diagnosis not present

## 2016-05-18 DIAGNOSIS — E440 Moderate protein-calorie malnutrition: Secondary | ICD-10-CM | POA: Diagnosis not present

## 2016-05-18 DIAGNOSIS — D509 Iron deficiency anemia, unspecified: Secondary | ICD-10-CM | POA: Diagnosis not present

## 2016-05-19 ENCOUNTER — Ambulatory Visit (INDEPENDENT_AMBULATORY_CARE_PROVIDER_SITE_OTHER): Payer: Medicare Other | Admitting: Internal Medicine

## 2016-05-19 ENCOUNTER — Encounter: Payer: Self-pay | Admitting: Internal Medicine

## 2016-05-19 VITALS — BP 130/64 | HR 72 | Temp 97.7°F | Resp 16 | Ht 62.0 in | Wt 144.2 lb

## 2016-05-19 DIAGNOSIS — K2100 Gastro-esophageal reflux disease with esophagitis, without bleeding: Secondary | ICD-10-CM

## 2016-05-19 DIAGNOSIS — Z992 Dependence on renal dialysis: Secondary | ICD-10-CM

## 2016-05-19 DIAGNOSIS — M1000 Idiopathic gout, unspecified site: Secondary | ICD-10-CM

## 2016-05-19 DIAGNOSIS — Z79899 Other long term (current) drug therapy: Secondary | ICD-10-CM | POA: Diagnosis not present

## 2016-05-19 DIAGNOSIS — E440 Moderate protein-calorie malnutrition: Secondary | ICD-10-CM | POA: Diagnosis not present

## 2016-05-19 DIAGNOSIS — R17 Unspecified jaundice: Secondary | ICD-10-CM | POA: Diagnosis not present

## 2016-05-19 DIAGNOSIS — E782 Mixed hyperlipidemia: Secondary | ICD-10-CM

## 2016-05-19 DIAGNOSIS — E559 Vitamin D deficiency, unspecified: Secondary | ICD-10-CM

## 2016-05-19 DIAGNOSIS — N186 End stage renal disease: Secondary | ICD-10-CM | POA: Diagnosis not present

## 2016-05-19 DIAGNOSIS — N2581 Secondary hyperparathyroidism of renal origin: Secondary | ICD-10-CM | POA: Diagnosis not present

## 2016-05-19 DIAGNOSIS — R8299 Other abnormal findings in urine: Secondary | ICD-10-CM | POA: Diagnosis not present

## 2016-05-19 DIAGNOSIS — E1122 Type 2 diabetes mellitus with diabetic chronic kidney disease: Secondary | ICD-10-CM

## 2016-05-19 DIAGNOSIS — E1129 Type 2 diabetes mellitus with other diabetic kidney complication: Secondary | ICD-10-CM | POA: Insufficient documentation

## 2016-05-19 DIAGNOSIS — R7309 Other abnormal glucose: Secondary | ICD-10-CM | POA: Diagnosis not present

## 2016-05-19 DIAGNOSIS — I10 Essential (primary) hypertension: Secondary | ICD-10-CM

## 2016-05-19 DIAGNOSIS — D631 Anemia in chronic kidney disease: Secondary | ICD-10-CM | POA: Diagnosis not present

## 2016-05-19 DIAGNOSIS — D509 Iron deficiency anemia, unspecified: Secondary | ICD-10-CM | POA: Diagnosis not present

## 2016-05-19 LAB — HEPATIC FUNCTION PANEL
ALBUMIN: 3.4 g/dL — AB (ref 3.6–5.1)
ALT: 25 U/L (ref 6–29)
AST: 23 U/L (ref 10–35)
Alkaline Phosphatase: 201 U/L — ABNORMAL HIGH (ref 33–130)
BILIRUBIN TOTAL: 0.5 mg/dL (ref 0.2–1.2)
Bilirubin, Direct: 0.1 mg/dL (ref <=0.2)
Indirect Bilirubin: 0.4 mg/dL (ref 0.2–1.2)
TOTAL PROTEIN: 5.9 g/dL — AB (ref 6.1–8.1)

## 2016-05-19 LAB — CBC WITH DIFFERENTIAL/PLATELET
BASOS PCT: 0 %
Basophils Absolute: 0 cells/uL (ref 0–200)
EOS ABS: 260 {cells}/uL (ref 15–500)
Eosinophils Relative: 4 %
HEMATOCRIT: 34.6 % — AB (ref 35.0–45.0)
Hemoglobin: 11.3 g/dL — ABNORMAL LOW (ref 11.7–15.5)
LYMPHS PCT: 31 %
Lymphs Abs: 2015 cells/uL (ref 850–3900)
MCH: 28.4 pg (ref 27.0–33.0)
MCHC: 32.7 g/dL (ref 32.0–36.0)
MCV: 86.9 fL (ref 80.0–100.0)
MONO ABS: 325 {cells}/uL (ref 200–950)
MONOS PCT: 5 %
MPV: 9.9 fL (ref 7.5–12.5)
NEUTROS PCT: 60 %
Neutro Abs: 3900 cells/uL (ref 1500–7800)
PLATELETS: 181 10*3/uL (ref 140–400)
RBC: 3.98 MIL/uL (ref 3.80–5.10)
RDW: 16.4 % — AB (ref 11.0–15.0)
WBC: 6.5 10*3/uL (ref 3.8–10.8)

## 2016-05-19 LAB — BASIC METABOLIC PANEL WITH GFR
BUN: 84 mg/dL — AB (ref 7–25)
CALCIUM: 8.1 mg/dL — AB (ref 8.6–10.4)
CO2: 26 mmol/L (ref 20–31)
Chloride: 100 mmol/L (ref 98–110)
Creat: 7.29 mg/dL — ABNORMAL HIGH (ref 0.60–0.93)
GFR, EST AFRICAN AMERICAN: 6 mL/min — AB (ref >=60)
GFR, Est Non African American: 5 mL/min — ABNORMAL LOW (ref >=60)
GLUCOSE: 121 mg/dL — AB (ref 65–99)
Potassium: 4.3 mmol/L (ref 3.5–5.3)
Sodium: 142 mmol/L (ref 135–146)

## 2016-05-19 LAB — LIPID PANEL
CHOLESTEROL: 217 mg/dL — AB (ref 125–200)
HDL: 87 mg/dL (ref >=46)
LDL Cholesterol: 100 mg/dL (ref <130)
TRIGLYCERIDES: 148 mg/dL (ref <150)
Total CHOL/HDL Ratio: 2.5 Ratio (ref <=5.0)
VLDL: 30 mg/dL (ref <30)

## 2016-05-19 LAB — MAGNESIUM: MAGNESIUM: 1.9 mg/dL (ref 1.5–2.5)

## 2016-05-19 LAB — TSH: TSH: 2.25 mIU/L

## 2016-05-19 LAB — HEMOGLOBIN A1C
Hgb A1c MFr Bld: 7.2 % — ABNORMAL HIGH (ref <5.7)
MEAN PLASMA GLUCOSE: 160 mg/dL

## 2016-05-19 LAB — URIC ACID: URIC ACID, SERUM: 3.2 mg/dL (ref 2.5–7.0)

## 2016-05-19 MED ORDER — FISH OIL 1200 MG PO CPDR: DELAYED_RELEASE_CAPSULE | ORAL | Status: DC

## 2016-05-19 NOTE — Patient Instructions (Signed)

## 2016-05-19 NOTE — Progress Notes (Signed)
Patient ID: Eileen Perez, female   DOB: October 28, 1941, 75 y.o.   MRN: [id number]  [facility]                       Unk Pinto, M.D.        Uvaldo Bristle. Silverio Lay, P.A.-C       Starlyn Skeans, P.A.-C   [facility]                [address] [place] [id number] Telephone [phone] Telefax [phone] ______________________________________________________________________     This very nice 74 y.o. DBF presents for 3 month follow up with Hypertension, Hyperlipidemia, Pre-Diabetes and Vitamin D Deficiency.      Patient is treated for HTN & BP has been controlled at home. Today's BP is 130/64 mmHg. Patient has had no complaints of any cardiac type chest pain, palpitations, dyspnea/orthopnea/PND, dizziness, claudication, or dependent edema.     Hyperlipidemia is not controlled with diet & RYREn. Patient denies myalgias or other med SE's. Last Lipids were not at goal with Cholesterol 238*; HDL 98; LDL 129; Triglycerides 56 on 10/29/2015.   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 She has no hx or k/o Diabetic eye Dz. Last A1c was not at goal -  6.1% on 08/12/2014. Patient is followed expectantly  by Dr Raymondo Band for her CKD.     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 Patient is followed by Dr Raymondo Band and patient started home peritoneal Dialysis in March 2017 and is doing fine. WRT her DM, she is managing with dietary control and she denies symptoms of reactive hypoglycemia, diabetic polys, paresthesias or visual blurring.   Last A1c was  Bld 5.7% on 10/29/2015.     Further, the patient also has history of Vitamin D Deficiency and id rx'd Calcitrol by Dr Moshe Cipro who has also added Renvela. Last  vitamin D was  Low at 31 on 10/29/2015.   Medication Sig  . allopurinol  300 MG tablet Take 1/2 to 1 tablet by  mouth daily as directed  . aspirin 81 MG  Take 81 mg by mouth daily.  . calcitRIOL (ROCALTROL) 0.25 MCG    . CALCIUM PO Take by mouth.  Marland Kitchen VITAMIN D Take 1 tablet by mouth daily.  . cloNIDine  0.1 MG  Take 1 tablet by mouth  twice a day  . COLACE 100 MG  Take 100 mg by mouth 2 (two) times daily.  Marland Kitchen SLOW FE  Take 45 mg by mouth 2 (two) times daily.  . hydrALAZINE  10 MG tablet TAKE 2 TABLETS BY MOUTH 2  TIMES DAILY. NEED OV.  . labetalol  200 MG tablet Take 1 tablet by mouth two  times daily  . MultiVit w/Min Take 1 tablet by mouth daily.   Flax Seed Oil 1200 mg  Take 1 x daily  . Fish Oil 1200 mg  Take 1 time daily.  Marland Kitchen MIRALAX  Take 17 g by mouth daily.  . Red Yeast Rice Extract  Take 1 tablet by mouth daily.  . sevelamer (RENVELA) 800 MG Take 800 mg by mouth 3 (three) times daily with meals.  Marland Kitchen zinc  gluconate 50 MG tablet Take 50 mg by mouth every morning.  Marland Kitchen amLODipine (NORVASC) 5 MG Take 2.5 mg by mouth at bedtime.    Allergies  Allergen Reactions  . Ace Inhibitors     unknown  . Nsaids     unknown   PMHx:   Past Medical History  Diagnosis Date  . Diabetes mellitus     Pre  . Hypertension   . Arthritis     Osteoarthritis  . Anemia   . History of blood transfusion   . Chronic kidney disease     Chronic renal insufficiency  . Pancreatic cyst 1999  . Thyroid disease     Hyperparathyroid   . GERD (gastroesophageal reflux disease)   . Gout   . Osteopenia   . Diverticulosis   . Hiatal hernia   . Hyperlipidemia   . Heart murmur     hx of in childhood   Immunization History  Administered Date(s) Administered  . DT 02/05/2014  . Influenza, High Dose Seasonal PF 09/25/2013  . Influenza-Unspecified 08/11/2015  . Pneumococcal Polysaccharide-23 11/20/2009  . Td 11/21/2003  . Zoster 11/20/2005   Past Surgical History  Procedure Laterality Date  .  Pancreatic cyst excision  1999  . Left TKR/Arthroplasty  2012  . Left  cataract extraction with IOL    . Cholecystectomy    . Rt Total knee arthroplasty - Gearlean Alf, MD Right 06/09/2013  . Colonoscopy  03/21/12  Next one in 2018  . Left knee replacement     . Rt. Total hip arthroplasty - Gaynelle Arabian, MD Right 05/26/2015   FHx:    Reviewed / unchanged  SHx:    Reviewed / unchanged  Systems Review:  Constitutional: Denies fever, chills, wt changes, headaches, insomnia, fatigue, night sweats, change in appetite. Eyes: Denies redness, blurred vision, diplopia, discharge, itchy, watery eyes.  ENT: Denies discharge, congestion, post nasal drip, epistaxis, sore throat, earache, hearing loss, dental pain, tinnitus, vertigo, sinus pain, snoring.  CV: Denies chest pain, palpitations, irregular heartbeat, syncope, dyspnea, diaphoresis, orthopnea, PND, claudication or edema. Respiratory: denies cough, dyspnea, DOE, pleurisy, hoarseness, laryngitis, wheezing.  Gastrointestinal: Denies dysphagia, odynophagia, heartburn, reflux, water brash, abdominal pain or cramps, nausea, vomiting, bloating, diarrhea, constipation, hematemesis, melena, hematochezia  or hemorrhoids. Genitourinary: Denies dysuria, frequency, urgency, nocturia, hesitancy, discharge, hematuria or flank pain. Musculoskeletal: Denies arthralgias, myalgias, stiffness, jt. swelling, pain, limping or strain/sprain.  Skin: Denies pruritus, rash, hives, warts, acne, eczema or change in skin lesion(s). Neuro: No weakness, tremor, incoordination, spasms, paresthesia or pain. Psychiatric: Denies confusion, memory loss or sensory loss. Endo: Denies change in weight, skin or hair change.  Heme/Lymph: No excessive bleeding, bruising or enlarged lymph nodes.  Physical Exam  BP 130/64 mmHg  Pulse 72  Temp(Src) 97.7 F (36.5 C)  Resp 16  Ht 5\' 2"  (1.575 m)  Wt 144 lb 3.2 oz (65.409 kg)  BMI 26.37 kg/m2  Appears well nourished and in no  distress. Eyes: PERRLA, EOMs, conjunctiva no swelling or erythema. Sinuses: No frontal/maxillary tenderness ENT/Mouth: EAC's clear, TM's nl w/o erythema, bulging. Nares clear w/o erythema, swelling, exudates. Oropharynx clear without erythema or exudates. Oral hygiene is good. Tongue normal, non obstructing. Hearing intact.  Neck: Supple. Thyroid nl. Car 2+/2+ without bruits, nodes or JVD. Chest: Respirations nl with BS clear & equal w/o rales, rhonchi, wheezing or stridor.  Cor: Heart sounds normal w/ regular rate and rhythm without sig. murmurs, gallops, clicks, or rubs. Peripheral pulses normal and equal  without edema.  Abdomen: Soft & bowel sounds normal. Non-tender w/o guarding, rebound, hernias, masses, or organomegaly.  Lymphatics: Unremarkable.  Musculoskeletal: Full ROM all peripheral extremities, joint stability, 5/5 strength, and normal gait.  Skin: Warm, dry without exposed rashes, lesions or ecchymosis apparent.  Neuro: Cranial nerves intact, reflexes equal bilaterally. Sensory-motor testing grossly intact. Tendon reflexes grossly intact.  Pysch: Alert & oriented x 3.  Insight and judgement nl & appropriate. No ideations.  Assessment and Plan:  1. Essential hypertension  - Continue monitor blood pressure at home. Continue diet/meds same. - TSH  2. Hyperlipidemia  - Continue diet/meds, exercise,& lifestyle modifications. Continue monitor periodic cholesterol/liver & renal functions  - Lipid panel - TSH  3. 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 Monitor appropriate labs. - Hemoglobin A1c - Insulin, random  4. Vitamin D deficiency  - Continue supplementation. - VITAMIN D 25 Hydroxy  5. Idiopathic gout  - Uric acid  6. GERD   7. ESRD (end stage renal disease) ([place])   8. Medication management  - CBC with Differential/Platelet - BASIC METABOLIC PANEL WITH GFR - Hepatic function panel - Magnesium   Recommended regular  exercise, BP monitoring, weight control, and discussed med and SE's. Recommended labs to assess and monitor clinical status. Further disposition pending results of labs. Over 30 minutes of exam, counseling, chart review was performed

## 2016-05-20 DIAGNOSIS — D631 Anemia in chronic kidney disease: Secondary | ICD-10-CM | POA: Diagnosis not present

## 2016-05-20 DIAGNOSIS — N2581 Secondary hyperparathyroidism of renal origin: Secondary | ICD-10-CM | POA: Diagnosis not present

## 2016-05-20 DIAGNOSIS — D509 Iron deficiency anemia, unspecified: Secondary | ICD-10-CM | POA: Diagnosis not present

## 2016-05-20 DIAGNOSIS — E784 Other hyperlipidemia: Secondary | ICD-10-CM | POA: Diagnosis not present

## 2016-05-20 DIAGNOSIS — N186 End stage renal disease: Secondary | ICD-10-CM | POA: Diagnosis not present

## 2016-05-20 DIAGNOSIS — R8299 Other abnormal findings in urine: Secondary | ICD-10-CM | POA: Diagnosis not present

## 2016-05-20 DIAGNOSIS — E119 Type 2 diabetes mellitus without complications: Secondary | ICD-10-CM | POA: Diagnosis not present

## 2016-05-20 DIAGNOSIS — E440 Moderate protein-calorie malnutrition: Secondary | ICD-10-CM | POA: Diagnosis not present

## 2016-05-20 DIAGNOSIS — N2589 Other disorders resulting from impaired renal tubular function: Secondary | ICD-10-CM | POA: Diagnosis not present

## 2016-05-20 LAB — VITAMIN D 25 HYDROXY (VIT D DEFICIENCY, FRACTURES): VIT D 25 HYDROXY: 32 ng/mL (ref 30–100)

## 2016-05-20 LAB — INSULIN, RANDOM: INSULIN: 5.9 u[IU]/mL (ref 2.0–19.6)

## 2016-05-21 DIAGNOSIS — D631 Anemia in chronic kidney disease: Secondary | ICD-10-CM | POA: Diagnosis not present

## 2016-05-21 DIAGNOSIS — E440 Moderate protein-calorie malnutrition: Secondary | ICD-10-CM | POA: Diagnosis not present

## 2016-05-21 DIAGNOSIS — N2581 Secondary hyperparathyroidism of renal origin: Secondary | ICD-10-CM | POA: Diagnosis not present

## 2016-05-21 DIAGNOSIS — N186 End stage renal disease: Secondary | ICD-10-CM | POA: Diagnosis not present

## 2016-05-21 DIAGNOSIS — E784 Other hyperlipidemia: Secondary | ICD-10-CM | POA: Diagnosis not present

## 2016-05-21 DIAGNOSIS — N2589 Other disorders resulting from impaired renal tubular function: Secondary | ICD-10-CM | POA: Diagnosis not present

## 2016-05-21 DIAGNOSIS — E119 Type 2 diabetes mellitus without complications: Secondary | ICD-10-CM | POA: Diagnosis not present

## 2016-05-21 DIAGNOSIS — D509 Iron deficiency anemia, unspecified: Secondary | ICD-10-CM | POA: Diagnosis not present

## 2016-05-21 DIAGNOSIS — R8299 Other abnormal findings in urine: Secondary | ICD-10-CM | POA: Diagnosis not present

## 2016-05-22 DIAGNOSIS — D509 Iron deficiency anemia, unspecified: Secondary | ICD-10-CM | POA: Diagnosis not present

## 2016-05-22 DIAGNOSIS — D631 Anemia in chronic kidney disease: Secondary | ICD-10-CM | POA: Diagnosis not present

## 2016-05-22 DIAGNOSIS — N2581 Secondary hyperparathyroidism of renal origin: Secondary | ICD-10-CM | POA: Diagnosis not present

## 2016-05-22 DIAGNOSIS — N2589 Other disorders resulting from impaired renal tubular function: Secondary | ICD-10-CM | POA: Diagnosis not present

## 2016-05-22 DIAGNOSIS — E440 Moderate protein-calorie malnutrition: Secondary | ICD-10-CM | POA: Diagnosis not present

## 2016-05-22 DIAGNOSIS — E119 Type 2 diabetes mellitus without complications: Secondary | ICD-10-CM | POA: Diagnosis not present

## 2016-05-22 DIAGNOSIS — E784 Other hyperlipidemia: Secondary | ICD-10-CM | POA: Diagnosis not present

## 2016-05-22 DIAGNOSIS — N186 End stage renal disease: Secondary | ICD-10-CM | POA: Diagnosis not present

## 2016-05-22 DIAGNOSIS — R8299 Other abnormal findings in urine: Secondary | ICD-10-CM | POA: Diagnosis not present

## 2016-05-23 DIAGNOSIS — N2589 Other disorders resulting from impaired renal tubular function: Secondary | ICD-10-CM | POA: Diagnosis not present

## 2016-05-23 DIAGNOSIS — N186 End stage renal disease: Secondary | ICD-10-CM | POA: Diagnosis not present

## 2016-05-23 DIAGNOSIS — N2581 Secondary hyperparathyroidism of renal origin: Secondary | ICD-10-CM | POA: Diagnosis not present

## 2016-05-23 DIAGNOSIS — E119 Type 2 diabetes mellitus without complications: Secondary | ICD-10-CM | POA: Diagnosis not present

## 2016-05-23 DIAGNOSIS — E784 Other hyperlipidemia: Secondary | ICD-10-CM | POA: Diagnosis not present

## 2016-05-23 DIAGNOSIS — R8299 Other abnormal findings in urine: Secondary | ICD-10-CM | POA: Diagnosis not present

## 2016-05-23 DIAGNOSIS — D509 Iron deficiency anemia, unspecified: Secondary | ICD-10-CM | POA: Diagnosis not present

## 2016-05-23 DIAGNOSIS — E440 Moderate protein-calorie malnutrition: Secondary | ICD-10-CM | POA: Diagnosis not present

## 2016-05-23 DIAGNOSIS — D631 Anemia in chronic kidney disease: Secondary | ICD-10-CM | POA: Diagnosis not present

## 2016-05-24 DIAGNOSIS — E784 Other hyperlipidemia: Secondary | ICD-10-CM | POA: Diagnosis not present

## 2016-05-24 DIAGNOSIS — D631 Anemia in chronic kidney disease: Secondary | ICD-10-CM | POA: Diagnosis not present

## 2016-05-24 DIAGNOSIS — N186 End stage renal disease: Secondary | ICD-10-CM | POA: Diagnosis not present

## 2016-05-24 DIAGNOSIS — N2589 Other disorders resulting from impaired renal tubular function: Secondary | ICD-10-CM | POA: Diagnosis not present

## 2016-05-24 DIAGNOSIS — D509 Iron deficiency anemia, unspecified: Secondary | ICD-10-CM | POA: Diagnosis not present

## 2016-05-24 DIAGNOSIS — E119 Type 2 diabetes mellitus without complications: Secondary | ICD-10-CM | POA: Diagnosis not present

## 2016-05-24 DIAGNOSIS — E440 Moderate protein-calorie malnutrition: Secondary | ICD-10-CM | POA: Diagnosis not present

## 2016-05-24 DIAGNOSIS — R8299 Other abnormal findings in urine: Secondary | ICD-10-CM | POA: Diagnosis not present

## 2016-05-24 DIAGNOSIS — N2581 Secondary hyperparathyroidism of renal origin: Secondary | ICD-10-CM | POA: Diagnosis not present

## 2016-05-25 DIAGNOSIS — E119 Type 2 diabetes mellitus without complications: Secondary | ICD-10-CM | POA: Diagnosis not present

## 2016-05-25 DIAGNOSIS — N2581 Secondary hyperparathyroidism of renal origin: Secondary | ICD-10-CM | POA: Diagnosis not present

## 2016-05-25 DIAGNOSIS — N186 End stage renal disease: Secondary | ICD-10-CM | POA: Diagnosis not present

## 2016-05-25 DIAGNOSIS — D631 Anemia in chronic kidney disease: Secondary | ICD-10-CM | POA: Diagnosis not present

## 2016-05-25 DIAGNOSIS — R8299 Other abnormal findings in urine: Secondary | ICD-10-CM | POA: Diagnosis not present

## 2016-05-25 DIAGNOSIS — E440 Moderate protein-calorie malnutrition: Secondary | ICD-10-CM | POA: Diagnosis not present

## 2016-05-25 DIAGNOSIS — D509 Iron deficiency anemia, unspecified: Secondary | ICD-10-CM | POA: Diagnosis not present

## 2016-05-25 DIAGNOSIS — N2589 Other disorders resulting from impaired renal tubular function: Secondary | ICD-10-CM | POA: Diagnosis not present

## 2016-05-25 DIAGNOSIS — E784 Other hyperlipidemia: Secondary | ICD-10-CM | POA: Diagnosis not present

## 2016-05-26 ENCOUNTER — Other Ambulatory Visit (HOSPITAL_COMMUNITY): Payer: Self-pay | Admitting: Urology

## 2016-05-26 ENCOUNTER — Encounter: Payer: Self-pay | Admitting: Cardiology

## 2016-05-26 DIAGNOSIS — D509 Iron deficiency anemia, unspecified: Secondary | ICD-10-CM | POA: Diagnosis not present

## 2016-05-26 DIAGNOSIS — R8299 Other abnormal findings in urine: Secondary | ICD-10-CM | POA: Diagnosis not present

## 2016-05-26 DIAGNOSIS — E119 Type 2 diabetes mellitus without complications: Secondary | ICD-10-CM | POA: Diagnosis not present

## 2016-05-26 DIAGNOSIS — E784 Other hyperlipidemia: Secondary | ICD-10-CM | POA: Diagnosis not present

## 2016-05-26 DIAGNOSIS — D631 Anemia in chronic kidney disease: Secondary | ICD-10-CM | POA: Diagnosis not present

## 2016-05-26 DIAGNOSIS — N186 End stage renal disease: Secondary | ICD-10-CM | POA: Diagnosis not present

## 2016-05-26 DIAGNOSIS — E440 Moderate protein-calorie malnutrition: Secondary | ICD-10-CM | POA: Diagnosis not present

## 2016-05-26 DIAGNOSIS — N2589 Other disorders resulting from impaired renal tubular function: Secondary | ICD-10-CM | POA: Diagnosis not present

## 2016-05-26 DIAGNOSIS — N281 Cyst of kidney, acquired: Secondary | ICD-10-CM

## 2016-05-26 DIAGNOSIS — N2581 Secondary hyperparathyroidism of renal origin: Secondary | ICD-10-CM | POA: Diagnosis not present

## 2016-05-27 DIAGNOSIS — E784 Other hyperlipidemia: Secondary | ICD-10-CM | POA: Diagnosis not present

## 2016-05-27 DIAGNOSIS — D509 Iron deficiency anemia, unspecified: Secondary | ICD-10-CM | POA: Diagnosis not present

## 2016-05-27 DIAGNOSIS — D631 Anemia in chronic kidney disease: Secondary | ICD-10-CM | POA: Diagnosis not present

## 2016-05-27 DIAGNOSIS — N2581 Secondary hyperparathyroidism of renal origin: Secondary | ICD-10-CM | POA: Diagnosis not present

## 2016-05-27 DIAGNOSIS — N2589 Other disorders resulting from impaired renal tubular function: Secondary | ICD-10-CM | POA: Diagnosis not present

## 2016-05-27 DIAGNOSIS — E119 Type 2 diabetes mellitus without complications: Secondary | ICD-10-CM | POA: Diagnosis not present

## 2016-05-27 DIAGNOSIS — N186 End stage renal disease: Secondary | ICD-10-CM | POA: Diagnosis not present

## 2016-05-27 DIAGNOSIS — R8299 Other abnormal findings in urine: Secondary | ICD-10-CM | POA: Diagnosis not present

## 2016-05-27 DIAGNOSIS — E440 Moderate protein-calorie malnutrition: Secondary | ICD-10-CM | POA: Diagnosis not present

## 2016-05-28 DIAGNOSIS — N2589 Other disorders resulting from impaired renal tubular function: Secondary | ICD-10-CM | POA: Diagnosis not present

## 2016-05-28 DIAGNOSIS — D631 Anemia in chronic kidney disease: Secondary | ICD-10-CM | POA: Diagnosis not present

## 2016-05-28 DIAGNOSIS — E119 Type 2 diabetes mellitus without complications: Secondary | ICD-10-CM | POA: Diagnosis not present

## 2016-05-28 DIAGNOSIS — E440 Moderate protein-calorie malnutrition: Secondary | ICD-10-CM | POA: Diagnosis not present

## 2016-05-28 DIAGNOSIS — R8299 Other abnormal findings in urine: Secondary | ICD-10-CM | POA: Diagnosis not present

## 2016-05-28 DIAGNOSIS — E784 Other hyperlipidemia: Secondary | ICD-10-CM | POA: Diagnosis not present

## 2016-05-28 DIAGNOSIS — N2581 Secondary hyperparathyroidism of renal origin: Secondary | ICD-10-CM | POA: Diagnosis not present

## 2016-05-28 DIAGNOSIS — N186 End stage renal disease: Secondary | ICD-10-CM | POA: Diagnosis not present

## 2016-05-28 DIAGNOSIS — D509 Iron deficiency anemia, unspecified: Secondary | ICD-10-CM | POA: Diagnosis not present

## 2016-05-29 DIAGNOSIS — E440 Moderate protein-calorie malnutrition: Secondary | ICD-10-CM | POA: Diagnosis not present

## 2016-05-29 DIAGNOSIS — D631 Anemia in chronic kidney disease: Secondary | ICD-10-CM | POA: Diagnosis not present

## 2016-05-29 DIAGNOSIS — E119 Type 2 diabetes mellitus without complications: Secondary | ICD-10-CM | POA: Diagnosis not present

## 2016-05-29 DIAGNOSIS — N2589 Other disorders resulting from impaired renal tubular function: Secondary | ICD-10-CM | POA: Diagnosis not present

## 2016-05-29 DIAGNOSIS — D509 Iron deficiency anemia, unspecified: Secondary | ICD-10-CM | POA: Diagnosis not present

## 2016-05-29 DIAGNOSIS — R8299 Other abnormal findings in urine: Secondary | ICD-10-CM | POA: Diagnosis not present

## 2016-05-29 DIAGNOSIS — N2581 Secondary hyperparathyroidism of renal origin: Secondary | ICD-10-CM | POA: Diagnosis not present

## 2016-05-29 DIAGNOSIS — E784 Other hyperlipidemia: Secondary | ICD-10-CM | POA: Diagnosis not present

## 2016-05-29 DIAGNOSIS — N186 End stage renal disease: Secondary | ICD-10-CM | POA: Diagnosis not present

## 2016-05-30 DIAGNOSIS — N2581 Secondary hyperparathyroidism of renal origin: Secondary | ICD-10-CM | POA: Diagnosis not present

## 2016-05-30 DIAGNOSIS — E119 Type 2 diabetes mellitus without complications: Secondary | ICD-10-CM | POA: Diagnosis not present

## 2016-05-30 DIAGNOSIS — E440 Moderate protein-calorie malnutrition: Secondary | ICD-10-CM | POA: Diagnosis not present

## 2016-05-30 DIAGNOSIS — D631 Anemia in chronic kidney disease: Secondary | ICD-10-CM | POA: Diagnosis not present

## 2016-05-30 DIAGNOSIS — D509 Iron deficiency anemia, unspecified: Secondary | ICD-10-CM | POA: Diagnosis not present

## 2016-05-30 DIAGNOSIS — E784 Other hyperlipidemia: Secondary | ICD-10-CM | POA: Diagnosis not present

## 2016-05-30 DIAGNOSIS — R8299 Other abnormal findings in urine: Secondary | ICD-10-CM | POA: Diagnosis not present

## 2016-05-30 DIAGNOSIS — N186 End stage renal disease: Secondary | ICD-10-CM | POA: Diagnosis not present

## 2016-05-30 DIAGNOSIS — N2589 Other disorders resulting from impaired renal tubular function: Secondary | ICD-10-CM | POA: Diagnosis not present

## 2016-05-31 DIAGNOSIS — D631 Anemia in chronic kidney disease: Secondary | ICD-10-CM | POA: Diagnosis not present

## 2016-05-31 DIAGNOSIS — N186 End stage renal disease: Secondary | ICD-10-CM | POA: Diagnosis not present

## 2016-05-31 DIAGNOSIS — E440 Moderate protein-calorie malnutrition: Secondary | ICD-10-CM | POA: Diagnosis not present

## 2016-05-31 DIAGNOSIS — R8299 Other abnormal findings in urine: Secondary | ICD-10-CM | POA: Diagnosis not present

## 2016-05-31 DIAGNOSIS — N2581 Secondary hyperparathyroidism of renal origin: Secondary | ICD-10-CM | POA: Diagnosis not present

## 2016-05-31 DIAGNOSIS — N2589 Other disorders resulting from impaired renal tubular function: Secondary | ICD-10-CM | POA: Diagnosis not present

## 2016-05-31 DIAGNOSIS — E119 Type 2 diabetes mellitus without complications: Secondary | ICD-10-CM | POA: Diagnosis not present

## 2016-05-31 DIAGNOSIS — D509 Iron deficiency anemia, unspecified: Secondary | ICD-10-CM | POA: Diagnosis not present

## 2016-05-31 DIAGNOSIS — E784 Other hyperlipidemia: Secondary | ICD-10-CM | POA: Diagnosis not present

## 2016-06-01 DIAGNOSIS — E440 Moderate protein-calorie malnutrition: Secondary | ICD-10-CM | POA: Diagnosis not present

## 2016-06-01 DIAGNOSIS — E784 Other hyperlipidemia: Secondary | ICD-10-CM | POA: Diagnosis not present

## 2016-06-01 DIAGNOSIS — D509 Iron deficiency anemia, unspecified: Secondary | ICD-10-CM | POA: Diagnosis not present

## 2016-06-01 DIAGNOSIS — E119 Type 2 diabetes mellitus without complications: Secondary | ICD-10-CM | POA: Diagnosis not present

## 2016-06-01 DIAGNOSIS — N2589 Other disorders resulting from impaired renal tubular function: Secondary | ICD-10-CM | POA: Diagnosis not present

## 2016-06-01 DIAGNOSIS — N2581 Secondary hyperparathyroidism of renal origin: Secondary | ICD-10-CM | POA: Diagnosis not present

## 2016-06-01 DIAGNOSIS — N186 End stage renal disease: Secondary | ICD-10-CM | POA: Diagnosis not present

## 2016-06-01 DIAGNOSIS — D631 Anemia in chronic kidney disease: Secondary | ICD-10-CM | POA: Diagnosis not present

## 2016-06-01 DIAGNOSIS — R8299 Other abnormal findings in urine: Secondary | ICD-10-CM | POA: Diagnosis not present

## 2016-06-02 DIAGNOSIS — D509 Iron deficiency anemia, unspecified: Secondary | ICD-10-CM | POA: Diagnosis not present

## 2016-06-02 DIAGNOSIS — N2589 Other disorders resulting from impaired renal tubular function: Secondary | ICD-10-CM | POA: Diagnosis not present

## 2016-06-02 DIAGNOSIS — R8299 Other abnormal findings in urine: Secondary | ICD-10-CM | POA: Diagnosis not present

## 2016-06-02 DIAGNOSIS — E440 Moderate protein-calorie malnutrition: Secondary | ICD-10-CM | POA: Diagnosis not present

## 2016-06-02 DIAGNOSIS — N2581 Secondary hyperparathyroidism of renal origin: Secondary | ICD-10-CM | POA: Diagnosis not present

## 2016-06-02 DIAGNOSIS — D631 Anemia in chronic kidney disease: Secondary | ICD-10-CM | POA: Diagnosis not present

## 2016-06-02 DIAGNOSIS — N186 End stage renal disease: Secondary | ICD-10-CM | POA: Diagnosis not present

## 2016-06-02 DIAGNOSIS — E784 Other hyperlipidemia: Secondary | ICD-10-CM | POA: Diagnosis not present

## 2016-06-02 DIAGNOSIS — E119 Type 2 diabetes mellitus without complications: Secondary | ICD-10-CM | POA: Diagnosis not present

## 2016-06-03 DIAGNOSIS — E119 Type 2 diabetes mellitus without complications: Secondary | ICD-10-CM | POA: Diagnosis not present

## 2016-06-03 DIAGNOSIS — E784 Other hyperlipidemia: Secondary | ICD-10-CM | POA: Diagnosis not present

## 2016-06-03 DIAGNOSIS — N2589 Other disorders resulting from impaired renal tubular function: Secondary | ICD-10-CM | POA: Diagnosis not present

## 2016-06-03 DIAGNOSIS — E440 Moderate protein-calorie malnutrition: Secondary | ICD-10-CM | POA: Diagnosis not present

## 2016-06-03 DIAGNOSIS — D509 Iron deficiency anemia, unspecified: Secondary | ICD-10-CM | POA: Diagnosis not present

## 2016-06-03 DIAGNOSIS — D631 Anemia in chronic kidney disease: Secondary | ICD-10-CM | POA: Diagnosis not present

## 2016-06-03 DIAGNOSIS — N2581 Secondary hyperparathyroidism of renal origin: Secondary | ICD-10-CM | POA: Diagnosis not present

## 2016-06-03 DIAGNOSIS — N186 End stage renal disease: Secondary | ICD-10-CM | POA: Diagnosis not present

## 2016-06-03 DIAGNOSIS — R8299 Other abnormal findings in urine: Secondary | ICD-10-CM | POA: Diagnosis not present

## 2016-06-04 DIAGNOSIS — E119 Type 2 diabetes mellitus without complications: Secondary | ICD-10-CM | POA: Diagnosis not present

## 2016-06-04 DIAGNOSIS — D509 Iron deficiency anemia, unspecified: Secondary | ICD-10-CM | POA: Diagnosis not present

## 2016-06-04 DIAGNOSIS — N2589 Other disorders resulting from impaired renal tubular function: Secondary | ICD-10-CM | POA: Diagnosis not present

## 2016-06-04 DIAGNOSIS — E440 Moderate protein-calorie malnutrition: Secondary | ICD-10-CM | POA: Diagnosis not present

## 2016-06-04 DIAGNOSIS — D631 Anemia in chronic kidney disease: Secondary | ICD-10-CM | POA: Diagnosis not present

## 2016-06-04 DIAGNOSIS — E784 Other hyperlipidemia: Secondary | ICD-10-CM | POA: Diagnosis not present

## 2016-06-04 DIAGNOSIS — N186 End stage renal disease: Secondary | ICD-10-CM | POA: Diagnosis not present

## 2016-06-04 DIAGNOSIS — N2581 Secondary hyperparathyroidism of renal origin: Secondary | ICD-10-CM | POA: Diagnosis not present

## 2016-06-04 DIAGNOSIS — R8299 Other abnormal findings in urine: Secondary | ICD-10-CM | POA: Diagnosis not present

## 2016-06-05 DIAGNOSIS — E440 Moderate protein-calorie malnutrition: Secondary | ICD-10-CM | POA: Diagnosis not present

## 2016-06-05 DIAGNOSIS — N2581 Secondary hyperparathyroidism of renal origin: Secondary | ICD-10-CM | POA: Diagnosis not present

## 2016-06-05 DIAGNOSIS — D631 Anemia in chronic kidney disease: Secondary | ICD-10-CM | POA: Diagnosis not present

## 2016-06-05 DIAGNOSIS — R8299 Other abnormal findings in urine: Secondary | ICD-10-CM | POA: Diagnosis not present

## 2016-06-05 DIAGNOSIS — E784 Other hyperlipidemia: Secondary | ICD-10-CM | POA: Diagnosis not present

## 2016-06-05 DIAGNOSIS — N2589 Other disorders resulting from impaired renal tubular function: Secondary | ICD-10-CM | POA: Diagnosis not present

## 2016-06-05 DIAGNOSIS — D509 Iron deficiency anemia, unspecified: Secondary | ICD-10-CM | POA: Diagnosis not present

## 2016-06-05 DIAGNOSIS — E119 Type 2 diabetes mellitus without complications: Secondary | ICD-10-CM | POA: Diagnosis not present

## 2016-06-05 DIAGNOSIS — N186 End stage renal disease: Secondary | ICD-10-CM | POA: Diagnosis not present

## 2016-06-06 DIAGNOSIS — D509 Iron deficiency anemia, unspecified: Secondary | ICD-10-CM | POA: Diagnosis not present

## 2016-06-06 DIAGNOSIS — N186 End stage renal disease: Secondary | ICD-10-CM | POA: Diagnosis not present

## 2016-06-06 DIAGNOSIS — E784 Other hyperlipidemia: Secondary | ICD-10-CM | POA: Diagnosis not present

## 2016-06-06 DIAGNOSIS — E119 Type 2 diabetes mellitus without complications: Secondary | ICD-10-CM | POA: Diagnosis not present

## 2016-06-06 DIAGNOSIS — N2581 Secondary hyperparathyroidism of renal origin: Secondary | ICD-10-CM | POA: Diagnosis not present

## 2016-06-06 DIAGNOSIS — D631 Anemia in chronic kidney disease: Secondary | ICD-10-CM | POA: Diagnosis not present

## 2016-06-06 DIAGNOSIS — R8299 Other abnormal findings in urine: Secondary | ICD-10-CM | POA: Diagnosis not present

## 2016-06-06 DIAGNOSIS — E440 Moderate protein-calorie malnutrition: Secondary | ICD-10-CM | POA: Diagnosis not present

## 2016-06-06 DIAGNOSIS — N2589 Other disorders resulting from impaired renal tubular function: Secondary | ICD-10-CM | POA: Diagnosis not present

## 2016-06-07 DIAGNOSIS — E784 Other hyperlipidemia: Secondary | ICD-10-CM | POA: Diagnosis not present

## 2016-06-07 DIAGNOSIS — E119 Type 2 diabetes mellitus without complications: Secondary | ICD-10-CM | POA: Diagnosis not present

## 2016-06-07 DIAGNOSIS — N186 End stage renal disease: Secondary | ICD-10-CM | POA: Diagnosis not present

## 2016-06-07 DIAGNOSIS — N2589 Other disorders resulting from impaired renal tubular function: Secondary | ICD-10-CM | POA: Diagnosis not present

## 2016-06-07 DIAGNOSIS — D509 Iron deficiency anemia, unspecified: Secondary | ICD-10-CM | POA: Diagnosis not present

## 2016-06-07 DIAGNOSIS — R8299 Other abnormal findings in urine: Secondary | ICD-10-CM | POA: Diagnosis not present

## 2016-06-07 DIAGNOSIS — N2581 Secondary hyperparathyroidism of renal origin: Secondary | ICD-10-CM | POA: Diagnosis not present

## 2016-06-07 DIAGNOSIS — D631 Anemia in chronic kidney disease: Secondary | ICD-10-CM | POA: Diagnosis not present

## 2016-06-07 DIAGNOSIS — E440 Moderate protein-calorie malnutrition: Secondary | ICD-10-CM | POA: Diagnosis not present

## 2016-06-08 DIAGNOSIS — E119 Type 2 diabetes mellitus without complications: Secondary | ICD-10-CM | POA: Diagnosis not present

## 2016-06-08 DIAGNOSIS — R8299 Other abnormal findings in urine: Secondary | ICD-10-CM | POA: Diagnosis not present

## 2016-06-08 DIAGNOSIS — E440 Moderate protein-calorie malnutrition: Secondary | ICD-10-CM | POA: Diagnosis not present

## 2016-06-08 DIAGNOSIS — E784 Other hyperlipidemia: Secondary | ICD-10-CM | POA: Diagnosis not present

## 2016-06-08 DIAGNOSIS — D631 Anemia in chronic kidney disease: Secondary | ICD-10-CM | POA: Diagnosis not present

## 2016-06-08 DIAGNOSIS — N2581 Secondary hyperparathyroidism of renal origin: Secondary | ICD-10-CM | POA: Diagnosis not present

## 2016-06-08 DIAGNOSIS — N2589 Other disorders resulting from impaired renal tubular function: Secondary | ICD-10-CM | POA: Diagnosis not present

## 2016-06-08 DIAGNOSIS — D509 Iron deficiency anemia, unspecified: Secondary | ICD-10-CM | POA: Diagnosis not present

## 2016-06-08 DIAGNOSIS — N186 End stage renal disease: Secondary | ICD-10-CM | POA: Diagnosis not present

## 2016-06-09 DIAGNOSIS — E440 Moderate protein-calorie malnutrition: Secondary | ICD-10-CM | POA: Diagnosis not present

## 2016-06-09 DIAGNOSIS — E784 Other hyperlipidemia: Secondary | ICD-10-CM | POA: Diagnosis not present

## 2016-06-09 DIAGNOSIS — N186 End stage renal disease: Secondary | ICD-10-CM | POA: Diagnosis not present

## 2016-06-09 DIAGNOSIS — N2589 Other disorders resulting from impaired renal tubular function: Secondary | ICD-10-CM | POA: Diagnosis not present

## 2016-06-09 DIAGNOSIS — D631 Anemia in chronic kidney disease: Secondary | ICD-10-CM | POA: Diagnosis not present

## 2016-06-09 DIAGNOSIS — D509 Iron deficiency anemia, unspecified: Secondary | ICD-10-CM | POA: Diagnosis not present

## 2016-06-09 DIAGNOSIS — N2581 Secondary hyperparathyroidism of renal origin: Secondary | ICD-10-CM | POA: Diagnosis not present

## 2016-06-09 DIAGNOSIS — E119 Type 2 diabetes mellitus without complications: Secondary | ICD-10-CM | POA: Diagnosis not present

## 2016-06-09 DIAGNOSIS — R8299 Other abnormal findings in urine: Secondary | ICD-10-CM | POA: Diagnosis not present

## 2016-06-10 DIAGNOSIS — D631 Anemia in chronic kidney disease: Secondary | ICD-10-CM | POA: Diagnosis not present

## 2016-06-10 DIAGNOSIS — E119 Type 2 diabetes mellitus without complications: Secondary | ICD-10-CM | POA: Diagnosis not present

## 2016-06-10 DIAGNOSIS — N186 End stage renal disease: Secondary | ICD-10-CM | POA: Diagnosis not present

## 2016-06-10 DIAGNOSIS — E784 Other hyperlipidemia: Secondary | ICD-10-CM | POA: Diagnosis not present

## 2016-06-10 DIAGNOSIS — E440 Moderate protein-calorie malnutrition: Secondary | ICD-10-CM | POA: Diagnosis not present

## 2016-06-10 DIAGNOSIS — N2581 Secondary hyperparathyroidism of renal origin: Secondary | ICD-10-CM | POA: Diagnosis not present

## 2016-06-10 DIAGNOSIS — D509 Iron deficiency anemia, unspecified: Secondary | ICD-10-CM | POA: Diagnosis not present

## 2016-06-10 DIAGNOSIS — N2589 Other disorders resulting from impaired renal tubular function: Secondary | ICD-10-CM | POA: Diagnosis not present

## 2016-06-10 DIAGNOSIS — R8299 Other abnormal findings in urine: Secondary | ICD-10-CM | POA: Diagnosis not present

## 2016-06-11 DIAGNOSIS — N2589 Other disorders resulting from impaired renal tubular function: Secondary | ICD-10-CM | POA: Diagnosis not present

## 2016-06-11 DIAGNOSIS — E440 Moderate protein-calorie malnutrition: Secondary | ICD-10-CM | POA: Diagnosis not present

## 2016-06-11 DIAGNOSIS — D509 Iron deficiency anemia, unspecified: Secondary | ICD-10-CM | POA: Diagnosis not present

## 2016-06-11 DIAGNOSIS — N2581 Secondary hyperparathyroidism of renal origin: Secondary | ICD-10-CM | POA: Diagnosis not present

## 2016-06-11 DIAGNOSIS — R8299 Other abnormal findings in urine: Secondary | ICD-10-CM | POA: Diagnosis not present

## 2016-06-11 DIAGNOSIS — N186 End stage renal disease: Secondary | ICD-10-CM | POA: Diagnosis not present

## 2016-06-11 DIAGNOSIS — D631 Anemia in chronic kidney disease: Secondary | ICD-10-CM | POA: Diagnosis not present

## 2016-06-11 DIAGNOSIS — E119 Type 2 diabetes mellitus without complications: Secondary | ICD-10-CM | POA: Diagnosis not present

## 2016-06-11 DIAGNOSIS — E784 Other hyperlipidemia: Secondary | ICD-10-CM | POA: Diagnosis not present

## 2016-06-12 DIAGNOSIS — N186 End stage renal disease: Secondary | ICD-10-CM | POA: Diagnosis not present

## 2016-06-12 DIAGNOSIS — E784 Other hyperlipidemia: Secondary | ICD-10-CM | POA: Diagnosis not present

## 2016-06-12 DIAGNOSIS — N2589 Other disorders resulting from impaired renal tubular function: Secondary | ICD-10-CM | POA: Diagnosis not present

## 2016-06-12 DIAGNOSIS — D509 Iron deficiency anemia, unspecified: Secondary | ICD-10-CM | POA: Diagnosis not present

## 2016-06-12 DIAGNOSIS — E440 Moderate protein-calorie malnutrition: Secondary | ICD-10-CM | POA: Diagnosis not present

## 2016-06-12 DIAGNOSIS — D631 Anemia in chronic kidney disease: Secondary | ICD-10-CM | POA: Diagnosis not present

## 2016-06-12 DIAGNOSIS — E119 Type 2 diabetes mellitus without complications: Secondary | ICD-10-CM | POA: Diagnosis not present

## 2016-06-12 DIAGNOSIS — N2581 Secondary hyperparathyroidism of renal origin: Secondary | ICD-10-CM | POA: Diagnosis not present

## 2016-06-12 DIAGNOSIS — R8299 Other abnormal findings in urine: Secondary | ICD-10-CM | POA: Diagnosis not present

## 2016-06-13 DIAGNOSIS — N186 End stage renal disease: Secondary | ICD-10-CM | POA: Diagnosis not present

## 2016-06-13 DIAGNOSIS — N2589 Other disorders resulting from impaired renal tubular function: Secondary | ICD-10-CM | POA: Diagnosis not present

## 2016-06-13 DIAGNOSIS — E440 Moderate protein-calorie malnutrition: Secondary | ICD-10-CM | POA: Diagnosis not present

## 2016-06-13 DIAGNOSIS — D509 Iron deficiency anemia, unspecified: Secondary | ICD-10-CM | POA: Diagnosis not present

## 2016-06-13 DIAGNOSIS — E119 Type 2 diabetes mellitus without complications: Secondary | ICD-10-CM | POA: Diagnosis not present

## 2016-06-13 DIAGNOSIS — R8299 Other abnormal findings in urine: Secondary | ICD-10-CM | POA: Diagnosis not present

## 2016-06-13 DIAGNOSIS — D631 Anemia in chronic kidney disease: Secondary | ICD-10-CM | POA: Diagnosis not present

## 2016-06-13 DIAGNOSIS — N2581 Secondary hyperparathyroidism of renal origin: Secondary | ICD-10-CM | POA: Diagnosis not present

## 2016-06-13 DIAGNOSIS — E784 Other hyperlipidemia: Secondary | ICD-10-CM | POA: Diagnosis not present

## 2016-06-15 ENCOUNTER — Encounter: Payer: Self-pay | Admitting: Cardiology

## 2016-06-15 DIAGNOSIS — D631 Anemia in chronic kidney disease: Secondary | ICD-10-CM | POA: Diagnosis not present

## 2016-06-15 DIAGNOSIS — R8299 Other abnormal findings in urine: Secondary | ICD-10-CM | POA: Diagnosis not present

## 2016-06-15 DIAGNOSIS — E119 Type 2 diabetes mellitus without complications: Secondary | ICD-10-CM | POA: Diagnosis not present

## 2016-06-15 DIAGNOSIS — D509 Iron deficiency anemia, unspecified: Secondary | ICD-10-CM | POA: Diagnosis not present

## 2016-06-15 DIAGNOSIS — N2581 Secondary hyperparathyroidism of renal origin: Secondary | ICD-10-CM | POA: Diagnosis not present

## 2016-06-15 DIAGNOSIS — N2589 Other disorders resulting from impaired renal tubular function: Secondary | ICD-10-CM | POA: Diagnosis not present

## 2016-06-15 DIAGNOSIS — E440 Moderate protein-calorie malnutrition: Secondary | ICD-10-CM | POA: Diagnosis not present

## 2016-06-15 DIAGNOSIS — E784 Other hyperlipidemia: Secondary | ICD-10-CM | POA: Diagnosis not present

## 2016-06-15 DIAGNOSIS — N186 End stage renal disease: Secondary | ICD-10-CM | POA: Diagnosis not present

## 2016-06-15 NOTE — Progress Notes (Signed)
Cardiology Office Note   Date:  06/16/2016   ID:  Eileen Perez, DOB 1941-01-06, MRN [id number]  PCP:  Alesia Richards, MD  Cardiologist:   Minus Breeding, MD   Chief Complaint  Patient presents with  . Follow-up      History of Present Illness: Eileen Perez is a 75 y.o. female who presents for evaluation of multiple cardiovascular risk factors.  I saw her last year for evaluation . She was preop for hip surgery.  No further work up was needed.   Since then she has done very well.  She did start peritoneal dialysis.  She has done well with this.   The patient denies any new symptoms such as chest discomfort, neck or arm discomfort. There has been no new shortness of breath, PND or orthopnea. There have been no reported palpitations, presyncope or syncope.  Her BPs are well controlled.   She had resolution of her hip pain with her surgery.   Past Medical History:  Diagnosis Date  . Anemia   . Arthritis    Osteoarthritis  . Chronic kidney disease    Chronic renal insufficiency  . Diabetes mellitus    Pre  . Diverticulosis   . GERD (gastroesophageal reflux disease)   . Gout   . Hiatal hernia   . History of blood transfusion   . Hyperlipidemia   . Hypertension   . Osteopenia   . Pancreatic cyst 1999  . Thyroid disease    Hyperparathyroid     Past Surgical History:  Procedure Laterality Date  . CHOLECYSTECTOMY    . COLONOSCOPY  03/21/12   Next one in 2018  . EYE SURGERY Left    cataract extraction with IOL  . JOINT REPLACEMENT  2012   left knee  . left knee replacement     . PANCREATIC CYST EXCISION  1999  . TOTAL HIP ARTHROPLASTY Right 05/26/2015   Procedure: RIGHT TOTAL HIP ARTHROPLASTY ANTERIOR APPROACH;  Surgeon: Gaynelle Arabian, MD;  Location: [facility];  Service: Orthopedics;  Laterality: Right;  . TOTAL KNEE ARTHROPLASTY Right 06/09/2013   Procedure: RIGHT TOTAL KNEE ARTHROPLASTY;  Surgeon: Gearlean Alf, MD;  Location: [facility];  Service: Orthopedics;   Laterality: Right;     Current Outpatient Prescriptions  Medication Sig Dispense Refill  . allopurinol (ZYLOPRIM) 300 MG tablet Take 1/2 to 1 tablet by  mouth daily as directed 90 tablet 1  . aspirin 81 MG tablet Take 81 mg by mouth daily.    Marland Kitchen b complex-vitamin c-folic acid (NEPHRO-VITE) 0.8 MG TABS tablet Take 1 tablet by mouth at bedtime.    Marland Kitchen CALCIUM PO Take by mouth.    . Cholecalciferol (VITAMIN D-3 PO) Take 1 tablet by mouth daily.    . cinacalcet (SENSIPAR) 30 MG tablet Take 30 mg by mouth daily.    . cloNIDine (CATAPRES) 0.1 MG tablet Take 1 tablet by mouth  twice a day 180 tablet 1  . docusate sodium (COLACE) 100 MG capsule Take 100 mg by mouth 2 (two) times daily.    . Ferrous Sulfate (SLOW FE PO) Take 45 mg by mouth 2 (two) times daily.    . Flaxseed, Linseed, (FLAXSEED OIL PO) Take 1 tablet by mouth daily.    Marland Kitchen glucose blood (ACCU-CHEK AVIVA PLUS) test strip Test once daily 100 each 6  . hydrALAZINE (APRESOLINE) 10 MG tablet TAKE 2 TABLETS BY MOUTH 2  TIMES DAILY. NEED OV. 360 tablet 0  . labetalol (NORMODYNE) 200  MG tablet Take 1 tablet by mouth two  times daily 180 tablet 1  . Multiple Vitamin (MULTIVITAMIN WITH MINERALS) TABS tablet Take 1 tablet by mouth daily.    . Omega-3 Fatty Acids (FISH OIL) 1200 MG CPDR Takes 1 capsule 2 x /daily    . polyethylene glycol (MIRALAX / GLYCOLAX) packet Take 17 g by mouth daily. 100 each 3  . Red Yeast Rice Extract (RED YEAST RICE PO) Take 1 tablet by mouth daily.    . sevelamer carbonate (RENVELA) 800 MG tablet Take 800 mg by mouth 3 (three) times daily with meals.    . zinc gluconate 50 MG tablet Take 50 mg by mouth every morning.     No current facility-administered medications for this visit.     Allergies:   Ace inhibitors and Nsaids    ROS:  Please see the history of present illness.   Otherwise, review of systems are positive for none.   All other systems are reviewed and negative.    PHYSICAL EXAM: VS:  BP 109/61   Pulse  73   Ht 5\' 1"  (1.549 m)   Wt 144 lb (65.3 kg)   BMI 27.21 kg/m  , BMI Body mass index is 27.21 kg/m. GENERAL:  Well appearing HEENT:  Pupils equal round and reactive, fundi not visualized, oral mucosa unremarkable NECK:  No jugular venous distention, waveform within normal limits, carotid upstroke brisk and symmetric, no bruits, no thyromegaly LUNGS:  Clear to auscultation bilaterally BACK:  No CVA tenderness CHEST:  Unremarkable HEART:  PMI not displaced or sustained,S1 and S2 within normal limits, no S3, no S4, no clicks, no rubs, no murmurs ABD:  Flat, positive bowel sounds normal in frequency in pitch, no bruits, no rebound, no guarding, no midline pulsatile mass, no hepatomegaly, no splenomegaly EXT:  2 plus pulses throughout, no edema, no cyanosis no clubbing    EKG:  EKG is not ordered today.    Recent Labs: 05/19/2016: ALT 25; BUN 84; Creat 7.29; Hemoglobin 11.3; Magnesium 1.9; Platelets 181; Potassium 4.3; Sodium 142; TSH 2.25    Lipid Panel    Component Value Date/Time   CHOL 217 (H) 05/19/2016 0001   TRIG 148 05/19/2016 0001   HDL 87 05/19/2016 0001   CHOLHDL 2.5 05/19/2016 0001   VLDL 30 05/19/2016 0001   LDLCALC 100 05/19/2016 0001      Wt Readings from Last 3 Encounters:  06/16/16 144 lb (65.3 kg)  05/19/16 144 lb 3.2 oz (65.4 kg)  02/11/16 136 lb (61.7 kg)      Other studies Reviewed: Additional studies/ records that were reviewed today include: None Review of the above records demonstrates:    ASSESSMENT AND PLAN:   HTN: Her blood pressures are now well controlled.  This is likely related now to the fact that she is on peritoneal dialysis.  No change in therapy is indicated. No further evaluation is indicated. She will continue with the meds as listed.  DM:  Her A1c is now 7.2 which is unusual. She will have this followed by Alesia Richards, MD  Current medicines are reviewed at length with the patient today.  The patient does not have  concerns regarding medicines.  The following changes have been made:  As above  Labs/ tests ordered today include:  No orders of the defined types were placed in this encounter.    Disposition:   FU with me as needed. .     Signed, Minus Breeding, MD  06/16/2016 9:46 AM    [facility]

## 2016-06-16 ENCOUNTER — Ambulatory Visit (INDEPENDENT_AMBULATORY_CARE_PROVIDER_SITE_OTHER): Payer: Medicare Other | Admitting: Cardiology

## 2016-06-16 ENCOUNTER — Encounter: Payer: Self-pay | Admitting: Cardiology

## 2016-06-16 VITALS — BP 109/61 | HR 73 | Ht 61.0 in | Wt 144.0 lb

## 2016-06-16 DIAGNOSIS — R8299 Other abnormal findings in urine: Secondary | ICD-10-CM | POA: Diagnosis not present

## 2016-06-16 DIAGNOSIS — N186 End stage renal disease: Secondary | ICD-10-CM | POA: Diagnosis not present

## 2016-06-16 DIAGNOSIS — D509 Iron deficiency anemia, unspecified: Secondary | ICD-10-CM | POA: Diagnosis not present

## 2016-06-16 DIAGNOSIS — E119 Type 2 diabetes mellitus without complications: Secondary | ICD-10-CM | POA: Diagnosis not present

## 2016-06-16 DIAGNOSIS — E440 Moderate protein-calorie malnutrition: Secondary | ICD-10-CM | POA: Diagnosis not present

## 2016-06-16 DIAGNOSIS — N2589 Other disorders resulting from impaired renal tubular function: Secondary | ICD-10-CM | POA: Diagnosis not present

## 2016-06-16 DIAGNOSIS — I150 Renovascular hypertension: Secondary | ICD-10-CM | POA: Diagnosis not present

## 2016-06-16 DIAGNOSIS — N2581 Secondary hyperparathyroidism of renal origin: Secondary | ICD-10-CM | POA: Diagnosis not present

## 2016-06-16 DIAGNOSIS — D631 Anemia in chronic kidney disease: Secondary | ICD-10-CM | POA: Diagnosis not present

## 2016-06-16 DIAGNOSIS — E784 Other hyperlipidemia: Secondary | ICD-10-CM | POA: Diagnosis not present

## 2016-06-16 NOTE — Patient Instructions (Signed)
Your physician recommends that you schedule a follow-up appointment in: As Needed    

## 2016-06-17 DIAGNOSIS — D509 Iron deficiency anemia, unspecified: Secondary | ICD-10-CM | POA: Diagnosis not present

## 2016-06-17 DIAGNOSIS — E119 Type 2 diabetes mellitus without complications: Secondary | ICD-10-CM | POA: Diagnosis not present

## 2016-06-17 DIAGNOSIS — R8299 Other abnormal findings in urine: Secondary | ICD-10-CM | POA: Diagnosis not present

## 2016-06-17 DIAGNOSIS — E440 Moderate protein-calorie malnutrition: Secondary | ICD-10-CM | POA: Diagnosis not present

## 2016-06-17 DIAGNOSIS — N2589 Other disorders resulting from impaired renal tubular function: Secondary | ICD-10-CM | POA: Diagnosis not present

## 2016-06-17 DIAGNOSIS — N186 End stage renal disease: Secondary | ICD-10-CM | POA: Diagnosis not present

## 2016-06-17 DIAGNOSIS — D631 Anemia in chronic kidney disease: Secondary | ICD-10-CM | POA: Diagnosis not present

## 2016-06-17 DIAGNOSIS — N2581 Secondary hyperparathyroidism of renal origin: Secondary | ICD-10-CM | POA: Diagnosis not present

## 2016-06-17 DIAGNOSIS — E784 Other hyperlipidemia: Secondary | ICD-10-CM | POA: Diagnosis not present

## 2016-06-18 DIAGNOSIS — D631 Anemia in chronic kidney disease: Secondary | ICD-10-CM | POA: Diagnosis not present

## 2016-06-18 DIAGNOSIS — N2581 Secondary hyperparathyroidism of renal origin: Secondary | ICD-10-CM | POA: Diagnosis not present

## 2016-06-18 DIAGNOSIS — N186 End stage renal disease: Secondary | ICD-10-CM | POA: Diagnosis not present

## 2016-06-18 DIAGNOSIS — R8299 Other abnormal findings in urine: Secondary | ICD-10-CM | POA: Diagnosis not present

## 2016-06-18 DIAGNOSIS — E119 Type 2 diabetes mellitus without complications: Secondary | ICD-10-CM | POA: Diagnosis not present

## 2016-06-18 DIAGNOSIS — E784 Other hyperlipidemia: Secondary | ICD-10-CM | POA: Diagnosis not present

## 2016-06-18 DIAGNOSIS — N2589 Other disorders resulting from impaired renal tubular function: Secondary | ICD-10-CM | POA: Diagnosis not present

## 2016-06-18 DIAGNOSIS — E440 Moderate protein-calorie malnutrition: Secondary | ICD-10-CM | POA: Diagnosis not present

## 2016-06-18 DIAGNOSIS — D509 Iron deficiency anemia, unspecified: Secondary | ICD-10-CM | POA: Diagnosis not present

## 2016-06-19 DIAGNOSIS — E784 Other hyperlipidemia: Secondary | ICD-10-CM | POA: Diagnosis not present

## 2016-06-19 DIAGNOSIS — R8299 Other abnormal findings in urine: Secondary | ICD-10-CM | POA: Diagnosis not present

## 2016-06-19 DIAGNOSIS — N2589 Other disorders resulting from impaired renal tubular function: Secondary | ICD-10-CM | POA: Diagnosis not present

## 2016-06-19 DIAGNOSIS — E440 Moderate protein-calorie malnutrition: Secondary | ICD-10-CM | POA: Diagnosis not present

## 2016-06-19 DIAGNOSIS — E1122 Type 2 diabetes mellitus with diabetic chronic kidney disease: Secondary | ICD-10-CM | POA: Diagnosis not present

## 2016-06-19 DIAGNOSIS — N186 End stage renal disease: Secondary | ICD-10-CM | POA: Diagnosis not present

## 2016-06-19 DIAGNOSIS — D631 Anemia in chronic kidney disease: Secondary | ICD-10-CM | POA: Diagnosis not present

## 2016-06-19 DIAGNOSIS — E119 Type 2 diabetes mellitus without complications: Secondary | ICD-10-CM | POA: Diagnosis not present

## 2016-06-19 DIAGNOSIS — Z992 Dependence on renal dialysis: Secondary | ICD-10-CM | POA: Diagnosis not present

## 2016-06-19 DIAGNOSIS — D509 Iron deficiency anemia, unspecified: Secondary | ICD-10-CM | POA: Diagnosis not present

## 2016-06-19 DIAGNOSIS — N2581 Secondary hyperparathyroidism of renal origin: Secondary | ICD-10-CM | POA: Diagnosis not present

## 2016-06-20 DIAGNOSIS — R8299 Other abnormal findings in urine: Secondary | ICD-10-CM | POA: Diagnosis not present

## 2016-06-20 DIAGNOSIS — Z79899 Other long term (current) drug therapy: Secondary | ICD-10-CM | POA: Diagnosis not present

## 2016-06-20 DIAGNOSIS — N186 End stage renal disease: Secondary | ICD-10-CM | POA: Diagnosis not present

## 2016-06-20 DIAGNOSIS — D509 Iron deficiency anemia, unspecified: Secondary | ICD-10-CM | POA: Diagnosis not present

## 2016-06-20 DIAGNOSIS — R17 Unspecified jaundice: Secondary | ICD-10-CM | POA: Diagnosis not present

## 2016-06-20 DIAGNOSIS — D631 Anemia in chronic kidney disease: Secondary | ICD-10-CM | POA: Diagnosis not present

## 2016-06-20 DIAGNOSIS — N2589 Other disorders resulting from impaired renal tubular function: Secondary | ICD-10-CM | POA: Diagnosis not present

## 2016-06-20 DIAGNOSIS — E440 Moderate protein-calorie malnutrition: Secondary | ICD-10-CM | POA: Diagnosis not present

## 2016-06-21 DIAGNOSIS — N2589 Other disorders resulting from impaired renal tubular function: Secondary | ICD-10-CM | POA: Diagnosis not present

## 2016-06-21 DIAGNOSIS — N186 End stage renal disease: Secondary | ICD-10-CM | POA: Diagnosis not present

## 2016-06-21 DIAGNOSIS — D631 Anemia in chronic kidney disease: Secondary | ICD-10-CM | POA: Diagnosis not present

## 2016-06-21 DIAGNOSIS — E440 Moderate protein-calorie malnutrition: Secondary | ICD-10-CM | POA: Diagnosis not present

## 2016-06-21 DIAGNOSIS — R8299 Other abnormal findings in urine: Secondary | ICD-10-CM | POA: Diagnosis not present

## 2016-06-21 DIAGNOSIS — R17 Unspecified jaundice: Secondary | ICD-10-CM | POA: Diagnosis not present

## 2016-06-21 DIAGNOSIS — D509 Iron deficiency anemia, unspecified: Secondary | ICD-10-CM | POA: Diagnosis not present

## 2016-06-21 DIAGNOSIS — Z79899 Other long term (current) drug therapy: Secondary | ICD-10-CM | POA: Diagnosis not present

## 2016-06-22 DIAGNOSIS — E440 Moderate protein-calorie malnutrition: Secondary | ICD-10-CM | POA: Diagnosis not present

## 2016-06-22 DIAGNOSIS — R17 Unspecified jaundice: Secondary | ICD-10-CM | POA: Diagnosis not present

## 2016-06-22 DIAGNOSIS — N186 End stage renal disease: Secondary | ICD-10-CM | POA: Diagnosis not present

## 2016-06-22 DIAGNOSIS — R8299 Other abnormal findings in urine: Secondary | ICD-10-CM | POA: Diagnosis not present

## 2016-06-22 DIAGNOSIS — N2589 Other disorders resulting from impaired renal tubular function: Secondary | ICD-10-CM | POA: Diagnosis not present

## 2016-06-22 DIAGNOSIS — D631 Anemia in chronic kidney disease: Secondary | ICD-10-CM | POA: Diagnosis not present

## 2016-06-22 DIAGNOSIS — D509 Iron deficiency anemia, unspecified: Secondary | ICD-10-CM | POA: Diagnosis not present

## 2016-06-22 DIAGNOSIS — Z79899 Other long term (current) drug therapy: Secondary | ICD-10-CM | POA: Diagnosis not present

## 2016-06-23 DIAGNOSIS — D631 Anemia in chronic kidney disease: Secondary | ICD-10-CM | POA: Diagnosis not present

## 2016-06-23 DIAGNOSIS — N186 End stage renal disease: Secondary | ICD-10-CM | POA: Diagnosis not present

## 2016-06-23 DIAGNOSIS — N2589 Other disorders resulting from impaired renal tubular function: Secondary | ICD-10-CM | POA: Diagnosis not present

## 2016-06-23 DIAGNOSIS — Z79899 Other long term (current) drug therapy: Secondary | ICD-10-CM | POA: Diagnosis not present

## 2016-06-23 DIAGNOSIS — D509 Iron deficiency anemia, unspecified: Secondary | ICD-10-CM | POA: Diagnosis not present

## 2016-06-23 DIAGNOSIS — R8299 Other abnormal findings in urine: Secondary | ICD-10-CM | POA: Diagnosis not present

## 2016-06-23 DIAGNOSIS — E440 Moderate protein-calorie malnutrition: Secondary | ICD-10-CM | POA: Diagnosis not present

## 2016-06-23 DIAGNOSIS — R17 Unspecified jaundice: Secondary | ICD-10-CM | POA: Diagnosis not present

## 2016-06-24 DIAGNOSIS — R8299 Other abnormal findings in urine: Secondary | ICD-10-CM | POA: Diagnosis not present

## 2016-06-24 DIAGNOSIS — N186 End stage renal disease: Secondary | ICD-10-CM | POA: Diagnosis not present

## 2016-06-24 DIAGNOSIS — Z79899 Other long term (current) drug therapy: Secondary | ICD-10-CM | POA: Diagnosis not present

## 2016-06-24 DIAGNOSIS — E440 Moderate protein-calorie malnutrition: Secondary | ICD-10-CM | POA: Diagnosis not present

## 2016-06-24 DIAGNOSIS — N2589 Other disorders resulting from impaired renal tubular function: Secondary | ICD-10-CM | POA: Diagnosis not present

## 2016-06-24 DIAGNOSIS — D509 Iron deficiency anemia, unspecified: Secondary | ICD-10-CM | POA: Diagnosis not present

## 2016-06-24 DIAGNOSIS — D631 Anemia in chronic kidney disease: Secondary | ICD-10-CM | POA: Diagnosis not present

## 2016-06-24 DIAGNOSIS — R17 Unspecified jaundice: Secondary | ICD-10-CM | POA: Diagnosis not present

## 2016-06-25 DIAGNOSIS — D631 Anemia in chronic kidney disease: Secondary | ICD-10-CM | POA: Diagnosis not present

## 2016-06-25 DIAGNOSIS — N186 End stage renal disease: Secondary | ICD-10-CM | POA: Diagnosis not present

## 2016-06-25 DIAGNOSIS — D509 Iron deficiency anemia, unspecified: Secondary | ICD-10-CM | POA: Diagnosis not present

## 2016-06-25 DIAGNOSIS — R17 Unspecified jaundice: Secondary | ICD-10-CM | POA: Diagnosis not present

## 2016-06-25 DIAGNOSIS — E440 Moderate protein-calorie malnutrition: Secondary | ICD-10-CM | POA: Diagnosis not present

## 2016-06-25 DIAGNOSIS — N2589 Other disorders resulting from impaired renal tubular function: Secondary | ICD-10-CM | POA: Diagnosis not present

## 2016-06-25 DIAGNOSIS — Z79899 Other long term (current) drug therapy: Secondary | ICD-10-CM | POA: Diagnosis not present

## 2016-06-25 DIAGNOSIS — R8299 Other abnormal findings in urine: Secondary | ICD-10-CM | POA: Diagnosis not present

## 2016-06-26 DIAGNOSIS — Z79899 Other long term (current) drug therapy: Secondary | ICD-10-CM | POA: Diagnosis not present

## 2016-06-26 DIAGNOSIS — D509 Iron deficiency anemia, unspecified: Secondary | ICD-10-CM | POA: Diagnosis not present

## 2016-06-26 DIAGNOSIS — R8299 Other abnormal findings in urine: Secondary | ICD-10-CM | POA: Diagnosis not present

## 2016-06-26 DIAGNOSIS — E440 Moderate protein-calorie malnutrition: Secondary | ICD-10-CM | POA: Diagnosis not present

## 2016-06-26 DIAGNOSIS — N2589 Other disorders resulting from impaired renal tubular function: Secondary | ICD-10-CM | POA: Diagnosis not present

## 2016-06-26 DIAGNOSIS — D631 Anemia in chronic kidney disease: Secondary | ICD-10-CM | POA: Diagnosis not present

## 2016-06-26 DIAGNOSIS — R17 Unspecified jaundice: Secondary | ICD-10-CM | POA: Diagnosis not present

## 2016-06-26 DIAGNOSIS — N186 End stage renal disease: Secondary | ICD-10-CM | POA: Diagnosis not present

## 2016-06-27 DIAGNOSIS — D631 Anemia in chronic kidney disease: Secondary | ICD-10-CM | POA: Diagnosis not present

## 2016-06-27 DIAGNOSIS — R8299 Other abnormal findings in urine: Secondary | ICD-10-CM | POA: Diagnosis not present

## 2016-06-27 DIAGNOSIS — R17 Unspecified jaundice: Secondary | ICD-10-CM | POA: Diagnosis not present

## 2016-06-27 DIAGNOSIS — D509 Iron deficiency anemia, unspecified: Secondary | ICD-10-CM | POA: Diagnosis not present

## 2016-06-27 DIAGNOSIS — E440 Moderate protein-calorie malnutrition: Secondary | ICD-10-CM | POA: Diagnosis not present

## 2016-06-27 DIAGNOSIS — Z79899 Other long term (current) drug therapy: Secondary | ICD-10-CM | POA: Diagnosis not present

## 2016-06-27 DIAGNOSIS — N2589 Other disorders resulting from impaired renal tubular function: Secondary | ICD-10-CM | POA: Diagnosis not present

## 2016-06-27 DIAGNOSIS — N186 End stage renal disease: Secondary | ICD-10-CM | POA: Diagnosis not present

## 2016-06-28 DIAGNOSIS — E440 Moderate protein-calorie malnutrition: Secondary | ICD-10-CM | POA: Diagnosis not present

## 2016-06-28 DIAGNOSIS — Z79899 Other long term (current) drug therapy: Secondary | ICD-10-CM | POA: Diagnosis not present

## 2016-06-28 DIAGNOSIS — R17 Unspecified jaundice: Secondary | ICD-10-CM | POA: Diagnosis not present

## 2016-06-28 DIAGNOSIS — D509 Iron deficiency anemia, unspecified: Secondary | ICD-10-CM | POA: Diagnosis not present

## 2016-06-28 DIAGNOSIS — D631 Anemia in chronic kidney disease: Secondary | ICD-10-CM | POA: Diagnosis not present

## 2016-06-28 DIAGNOSIS — N186 End stage renal disease: Secondary | ICD-10-CM | POA: Diagnosis not present

## 2016-06-28 DIAGNOSIS — R8299 Other abnormal findings in urine: Secondary | ICD-10-CM | POA: Diagnosis not present

## 2016-06-28 DIAGNOSIS — N2589 Other disorders resulting from impaired renal tubular function: Secondary | ICD-10-CM | POA: Diagnosis not present

## 2016-06-29 DIAGNOSIS — N186 End stage renal disease: Secondary | ICD-10-CM | POA: Diagnosis not present

## 2016-06-29 DIAGNOSIS — Z79899 Other long term (current) drug therapy: Secondary | ICD-10-CM | POA: Diagnosis not present

## 2016-06-29 DIAGNOSIS — N2589 Other disorders resulting from impaired renal tubular function: Secondary | ICD-10-CM | POA: Diagnosis not present

## 2016-06-29 DIAGNOSIS — D631 Anemia in chronic kidney disease: Secondary | ICD-10-CM | POA: Diagnosis not present

## 2016-06-29 DIAGNOSIS — R8299 Other abnormal findings in urine: Secondary | ICD-10-CM | POA: Diagnosis not present

## 2016-06-29 DIAGNOSIS — E440 Moderate protein-calorie malnutrition: Secondary | ICD-10-CM | POA: Diagnosis not present

## 2016-06-29 DIAGNOSIS — D509 Iron deficiency anemia, unspecified: Secondary | ICD-10-CM | POA: Diagnosis not present

## 2016-06-29 DIAGNOSIS — R17 Unspecified jaundice: Secondary | ICD-10-CM | POA: Diagnosis not present

## 2016-06-30 ENCOUNTER — Ambulatory Visit (HOSPITAL_COMMUNITY)
Admission: RE | Admit: 2016-06-30 | Discharge: 2016-06-30 | Disposition: A | Discharge status: Home / Self Care | Payer: Medicare Other | Source: Ambulatory Visit | Attending: Urology | Admitting: Urology

## 2016-06-30 DIAGNOSIS — N281 Cyst of kidney, acquired: Secondary | ICD-10-CM | POA: Diagnosis not present

## 2016-06-30 DIAGNOSIS — D49519 Neoplasm of unspecified behavior of unspecified kidney: Secondary | ICD-10-CM | POA: Insufficient documentation

## 2016-06-30 DIAGNOSIS — D631 Anemia in chronic kidney disease: Secondary | ICD-10-CM | POA: Diagnosis not present

## 2016-06-30 DIAGNOSIS — K8689 Other specified diseases of pancreas: Secondary | ICD-10-CM | POA: Diagnosis not present

## 2016-06-30 DIAGNOSIS — D509 Iron deficiency anemia, unspecified: Secondary | ICD-10-CM | POA: Diagnosis not present

## 2016-06-30 DIAGNOSIS — Z79899 Other long term (current) drug therapy: Secondary | ICD-10-CM | POA: Diagnosis not present

## 2016-06-30 DIAGNOSIS — E440 Moderate protein-calorie malnutrition: Secondary | ICD-10-CM | POA: Diagnosis not present

## 2016-06-30 DIAGNOSIS — N2589 Other disorders resulting from impaired renal tubular function: Secondary | ICD-10-CM | POA: Diagnosis not present

## 2016-06-30 DIAGNOSIS — R8299 Other abnormal findings in urine: Secondary | ICD-10-CM | POA: Diagnosis not present

## 2016-06-30 DIAGNOSIS — R17 Unspecified jaundice: Secondary | ICD-10-CM | POA: Diagnosis not present

## 2016-06-30 DIAGNOSIS — N186 End stage renal disease: Secondary | ICD-10-CM | POA: Diagnosis not present

## 2016-06-30 DIAGNOSIS — N2889 Other specified disorders of kidney and ureter: Secondary | ICD-10-CM | POA: Diagnosis not present

## 2016-07-01 DIAGNOSIS — N2589 Other disorders resulting from impaired renal tubular function: Secondary | ICD-10-CM | POA: Diagnosis not present

## 2016-07-01 DIAGNOSIS — D509 Iron deficiency anemia, unspecified: Secondary | ICD-10-CM | POA: Diagnosis not present

## 2016-07-01 DIAGNOSIS — D631 Anemia in chronic kidney disease: Secondary | ICD-10-CM | POA: Diagnosis not present

## 2016-07-01 DIAGNOSIS — N186 End stage renal disease: Secondary | ICD-10-CM | POA: Diagnosis not present

## 2016-07-01 DIAGNOSIS — Z79899 Other long term (current) drug therapy: Secondary | ICD-10-CM | POA: Diagnosis not present

## 2016-07-01 DIAGNOSIS — R8299 Other abnormal findings in urine: Secondary | ICD-10-CM | POA: Diagnosis not present

## 2016-07-01 DIAGNOSIS — E440 Moderate protein-calorie malnutrition: Secondary | ICD-10-CM | POA: Diagnosis not present

## 2016-07-01 DIAGNOSIS — R17 Unspecified jaundice: Secondary | ICD-10-CM | POA: Diagnosis not present

## 2016-07-02 DIAGNOSIS — R17 Unspecified jaundice: Secondary | ICD-10-CM | POA: Diagnosis not present

## 2016-07-02 DIAGNOSIS — N186 End stage renal disease: Secondary | ICD-10-CM | POA: Diagnosis not present

## 2016-07-02 DIAGNOSIS — R8299 Other abnormal findings in urine: Secondary | ICD-10-CM | POA: Diagnosis not present

## 2016-07-02 DIAGNOSIS — E440 Moderate protein-calorie malnutrition: Secondary | ICD-10-CM | POA: Diagnosis not present

## 2016-07-02 DIAGNOSIS — N2589 Other disorders resulting from impaired renal tubular function: Secondary | ICD-10-CM | POA: Diagnosis not present

## 2016-07-02 DIAGNOSIS — D509 Iron deficiency anemia, unspecified: Secondary | ICD-10-CM | POA: Diagnosis not present

## 2016-07-02 DIAGNOSIS — D631 Anemia in chronic kidney disease: Secondary | ICD-10-CM | POA: Diagnosis not present

## 2016-07-02 DIAGNOSIS — Z79899 Other long term (current) drug therapy: Secondary | ICD-10-CM | POA: Diagnosis not present

## 2016-07-03 DIAGNOSIS — D631 Anemia in chronic kidney disease: Secondary | ICD-10-CM | POA: Diagnosis not present

## 2016-07-03 DIAGNOSIS — N186 End stage renal disease: Secondary | ICD-10-CM | POA: Diagnosis not present

## 2016-07-03 DIAGNOSIS — D509 Iron deficiency anemia, unspecified: Secondary | ICD-10-CM | POA: Diagnosis not present

## 2016-07-03 DIAGNOSIS — Z79899 Other long term (current) drug therapy: Secondary | ICD-10-CM | POA: Diagnosis not present

## 2016-07-03 DIAGNOSIS — E440 Moderate protein-calorie malnutrition: Secondary | ICD-10-CM | POA: Diagnosis not present

## 2016-07-03 DIAGNOSIS — R8299 Other abnormal findings in urine: Secondary | ICD-10-CM | POA: Diagnosis not present

## 2016-07-03 DIAGNOSIS — R17 Unspecified jaundice: Secondary | ICD-10-CM | POA: Diagnosis not present

## 2016-07-03 DIAGNOSIS — N2589 Other disorders resulting from impaired renal tubular function: Secondary | ICD-10-CM | POA: Diagnosis not present

## 2016-07-04 DIAGNOSIS — Z79899 Other long term (current) drug therapy: Secondary | ICD-10-CM | POA: Diagnosis not present

## 2016-07-04 DIAGNOSIS — R17 Unspecified jaundice: Secondary | ICD-10-CM | POA: Diagnosis not present

## 2016-07-04 DIAGNOSIS — N186 End stage renal disease: Secondary | ICD-10-CM | POA: Diagnosis not present

## 2016-07-04 DIAGNOSIS — N2589 Other disorders resulting from impaired renal tubular function: Secondary | ICD-10-CM | POA: Diagnosis not present

## 2016-07-04 DIAGNOSIS — E440 Moderate protein-calorie malnutrition: Secondary | ICD-10-CM | POA: Diagnosis not present

## 2016-07-04 DIAGNOSIS — D509 Iron deficiency anemia, unspecified: Secondary | ICD-10-CM | POA: Diagnosis not present

## 2016-07-04 DIAGNOSIS — D631 Anemia in chronic kidney disease: Secondary | ICD-10-CM | POA: Diagnosis not present

## 2016-07-04 DIAGNOSIS — R8299 Other abnormal findings in urine: Secondary | ICD-10-CM | POA: Diagnosis not present

## 2016-07-05 DIAGNOSIS — N186 End stage renal disease: Secondary | ICD-10-CM | POA: Diagnosis not present

## 2016-07-05 DIAGNOSIS — R8299 Other abnormal findings in urine: Secondary | ICD-10-CM | POA: Diagnosis not present

## 2016-07-05 DIAGNOSIS — R17 Unspecified jaundice: Secondary | ICD-10-CM | POA: Diagnosis not present

## 2016-07-05 DIAGNOSIS — E440 Moderate protein-calorie malnutrition: Secondary | ICD-10-CM | POA: Diagnosis not present

## 2016-07-05 DIAGNOSIS — D509 Iron deficiency anemia, unspecified: Secondary | ICD-10-CM | POA: Diagnosis not present

## 2016-07-05 DIAGNOSIS — Z79899 Other long term (current) drug therapy: Secondary | ICD-10-CM | POA: Diagnosis not present

## 2016-07-05 DIAGNOSIS — D631 Anemia in chronic kidney disease: Secondary | ICD-10-CM | POA: Diagnosis not present

## 2016-07-05 DIAGNOSIS — N2589 Other disorders resulting from impaired renal tubular function: Secondary | ICD-10-CM | POA: Diagnosis not present

## 2016-07-06 DIAGNOSIS — N2589 Other disorders resulting from impaired renal tubular function: Secondary | ICD-10-CM | POA: Diagnosis not present

## 2016-07-06 DIAGNOSIS — D631 Anemia in chronic kidney disease: Secondary | ICD-10-CM | POA: Diagnosis not present

## 2016-07-06 DIAGNOSIS — R8299 Other abnormal findings in urine: Secondary | ICD-10-CM | POA: Diagnosis not present

## 2016-07-06 DIAGNOSIS — N186 End stage renal disease: Secondary | ICD-10-CM | POA: Diagnosis not present

## 2016-07-06 DIAGNOSIS — R17 Unspecified jaundice: Secondary | ICD-10-CM | POA: Diagnosis not present

## 2016-07-06 DIAGNOSIS — Z79899 Other long term (current) drug therapy: Secondary | ICD-10-CM | POA: Diagnosis not present

## 2016-07-06 DIAGNOSIS — E440 Moderate protein-calorie malnutrition: Secondary | ICD-10-CM | POA: Diagnosis not present

## 2016-07-06 DIAGNOSIS — D509 Iron deficiency anemia, unspecified: Secondary | ICD-10-CM | POA: Diagnosis not present

## 2016-07-07 DIAGNOSIS — N281 Cyst of kidney, acquired: Secondary | ICD-10-CM | POA: Diagnosis not present

## 2016-07-07 DIAGNOSIS — N186 End stage renal disease: Secondary | ICD-10-CM | POA: Diagnosis not present

## 2016-07-07 DIAGNOSIS — D509 Iron deficiency anemia, unspecified: Secondary | ICD-10-CM | POA: Diagnosis not present

## 2016-07-07 DIAGNOSIS — R8299 Other abnormal findings in urine: Secondary | ICD-10-CM | POA: Diagnosis not present

## 2016-07-07 DIAGNOSIS — E440 Moderate protein-calorie malnutrition: Secondary | ICD-10-CM | POA: Diagnosis not present

## 2016-07-07 DIAGNOSIS — D631 Anemia in chronic kidney disease: Secondary | ICD-10-CM | POA: Diagnosis not present

## 2016-07-07 DIAGNOSIS — Z79899 Other long term (current) drug therapy: Secondary | ICD-10-CM | POA: Diagnosis not present

## 2016-07-07 DIAGNOSIS — N2589 Other disorders resulting from impaired renal tubular function: Secondary | ICD-10-CM | POA: Diagnosis not present

## 2016-07-07 DIAGNOSIS — R17 Unspecified jaundice: Secondary | ICD-10-CM | POA: Diagnosis not present

## 2016-07-08 DIAGNOSIS — R17 Unspecified jaundice: Secondary | ICD-10-CM | POA: Diagnosis not present

## 2016-07-08 DIAGNOSIS — E440 Moderate protein-calorie malnutrition: Secondary | ICD-10-CM | POA: Diagnosis not present

## 2016-07-08 DIAGNOSIS — D631 Anemia in chronic kidney disease: Secondary | ICD-10-CM | POA: Diagnosis not present

## 2016-07-08 DIAGNOSIS — N2589 Other disorders resulting from impaired renal tubular function: Secondary | ICD-10-CM | POA: Diagnosis not present

## 2016-07-08 DIAGNOSIS — N186 End stage renal disease: Secondary | ICD-10-CM | POA: Diagnosis not present

## 2016-07-08 DIAGNOSIS — R8299 Other abnormal findings in urine: Secondary | ICD-10-CM | POA: Diagnosis not present

## 2016-07-08 DIAGNOSIS — D509 Iron deficiency anemia, unspecified: Secondary | ICD-10-CM | POA: Diagnosis not present

## 2016-07-08 DIAGNOSIS — Z79899 Other long term (current) drug therapy: Secondary | ICD-10-CM | POA: Diagnosis not present

## 2016-07-09 DIAGNOSIS — D509 Iron deficiency anemia, unspecified: Secondary | ICD-10-CM | POA: Diagnosis not present

## 2016-07-09 DIAGNOSIS — R17 Unspecified jaundice: Secondary | ICD-10-CM | POA: Diagnosis not present

## 2016-07-09 DIAGNOSIS — N186 End stage renal disease: Secondary | ICD-10-CM | POA: Diagnosis not present

## 2016-07-09 DIAGNOSIS — N2589 Other disorders resulting from impaired renal tubular function: Secondary | ICD-10-CM | POA: Diagnosis not present

## 2016-07-09 DIAGNOSIS — D631 Anemia in chronic kidney disease: Secondary | ICD-10-CM | POA: Diagnosis not present

## 2016-07-09 DIAGNOSIS — R8299 Other abnormal findings in urine: Secondary | ICD-10-CM | POA: Diagnosis not present

## 2016-07-09 DIAGNOSIS — E440 Moderate protein-calorie malnutrition: Secondary | ICD-10-CM | POA: Diagnosis not present

## 2016-07-09 DIAGNOSIS — Z79899 Other long term (current) drug therapy: Secondary | ICD-10-CM | POA: Diagnosis not present

## 2016-07-10 DIAGNOSIS — N186 End stage renal disease: Secondary | ICD-10-CM | POA: Diagnosis not present

## 2016-07-10 DIAGNOSIS — E440 Moderate protein-calorie malnutrition: Secondary | ICD-10-CM | POA: Diagnosis not present

## 2016-07-10 DIAGNOSIS — D631 Anemia in chronic kidney disease: Secondary | ICD-10-CM | POA: Diagnosis not present

## 2016-07-10 DIAGNOSIS — Z79899 Other long term (current) drug therapy: Secondary | ICD-10-CM | POA: Diagnosis not present

## 2016-07-10 DIAGNOSIS — N2589 Other disorders resulting from impaired renal tubular function: Secondary | ICD-10-CM | POA: Diagnosis not present

## 2016-07-10 DIAGNOSIS — D509 Iron deficiency anemia, unspecified: Secondary | ICD-10-CM | POA: Diagnosis not present

## 2016-07-10 DIAGNOSIS — R8299 Other abnormal findings in urine: Secondary | ICD-10-CM | POA: Diagnosis not present

## 2016-07-10 DIAGNOSIS — R17 Unspecified jaundice: Secondary | ICD-10-CM | POA: Diagnosis not present

## 2016-07-11 DIAGNOSIS — N2589 Other disorders resulting from impaired renal tubular function: Secondary | ICD-10-CM | POA: Diagnosis not present

## 2016-07-11 DIAGNOSIS — R17 Unspecified jaundice: Secondary | ICD-10-CM | POA: Diagnosis not present

## 2016-07-11 DIAGNOSIS — Z79899 Other long term (current) drug therapy: Secondary | ICD-10-CM | POA: Diagnosis not present

## 2016-07-11 DIAGNOSIS — D631 Anemia in chronic kidney disease: Secondary | ICD-10-CM | POA: Diagnosis not present

## 2016-07-11 DIAGNOSIS — E440 Moderate protein-calorie malnutrition: Secondary | ICD-10-CM | POA: Diagnosis not present

## 2016-07-11 DIAGNOSIS — D509 Iron deficiency anemia, unspecified: Secondary | ICD-10-CM | POA: Diagnosis not present

## 2016-07-11 DIAGNOSIS — N186 End stage renal disease: Secondary | ICD-10-CM | POA: Diagnosis not present

## 2016-07-11 DIAGNOSIS — R8299 Other abnormal findings in urine: Secondary | ICD-10-CM | POA: Diagnosis not present

## 2016-07-12 DIAGNOSIS — R8299 Other abnormal findings in urine: Secondary | ICD-10-CM | POA: Diagnosis not present

## 2016-07-12 DIAGNOSIS — N186 End stage renal disease: Secondary | ICD-10-CM | POA: Diagnosis not present

## 2016-07-12 DIAGNOSIS — D631 Anemia in chronic kidney disease: Secondary | ICD-10-CM | POA: Diagnosis not present

## 2016-07-12 DIAGNOSIS — N2589 Other disorders resulting from impaired renal tubular function: Secondary | ICD-10-CM | POA: Diagnosis not present

## 2016-07-12 DIAGNOSIS — Z79899 Other long term (current) drug therapy: Secondary | ICD-10-CM | POA: Diagnosis not present

## 2016-07-12 DIAGNOSIS — E440 Moderate protein-calorie malnutrition: Secondary | ICD-10-CM | POA: Diagnosis not present

## 2016-07-12 DIAGNOSIS — D509 Iron deficiency anemia, unspecified: Secondary | ICD-10-CM | POA: Diagnosis not present

## 2016-07-12 DIAGNOSIS — R17 Unspecified jaundice: Secondary | ICD-10-CM | POA: Diagnosis not present

## 2016-07-13 DIAGNOSIS — D631 Anemia in chronic kidney disease: Secondary | ICD-10-CM | POA: Diagnosis not present

## 2016-07-13 DIAGNOSIS — R8299 Other abnormal findings in urine: Secondary | ICD-10-CM | POA: Diagnosis not present

## 2016-07-13 DIAGNOSIS — N186 End stage renal disease: Secondary | ICD-10-CM | POA: Diagnosis not present

## 2016-07-13 DIAGNOSIS — E440 Moderate protein-calorie malnutrition: Secondary | ICD-10-CM | POA: Diagnosis not present

## 2016-07-13 DIAGNOSIS — D509 Iron deficiency anemia, unspecified: Secondary | ICD-10-CM | POA: Diagnosis not present

## 2016-07-13 DIAGNOSIS — R17 Unspecified jaundice: Secondary | ICD-10-CM | POA: Diagnosis not present

## 2016-07-13 DIAGNOSIS — N2589 Other disorders resulting from impaired renal tubular function: Secondary | ICD-10-CM | POA: Diagnosis not present

## 2016-07-13 DIAGNOSIS — Z79899 Other long term (current) drug therapy: Secondary | ICD-10-CM | POA: Diagnosis not present

## 2016-07-14 DIAGNOSIS — N186 End stage renal disease: Secondary | ICD-10-CM | POA: Diagnosis not present

## 2016-07-14 DIAGNOSIS — R17 Unspecified jaundice: Secondary | ICD-10-CM | POA: Diagnosis not present

## 2016-07-14 DIAGNOSIS — D509 Iron deficiency anemia, unspecified: Secondary | ICD-10-CM | POA: Diagnosis not present

## 2016-07-14 DIAGNOSIS — D631 Anemia in chronic kidney disease: Secondary | ICD-10-CM | POA: Diagnosis not present

## 2016-07-14 DIAGNOSIS — N2589 Other disorders resulting from impaired renal tubular function: Secondary | ICD-10-CM | POA: Diagnosis not present

## 2016-07-14 DIAGNOSIS — Z79899 Other long term (current) drug therapy: Secondary | ICD-10-CM | POA: Diagnosis not present

## 2016-07-14 DIAGNOSIS — R8299 Other abnormal findings in urine: Secondary | ICD-10-CM | POA: Diagnosis not present

## 2016-07-14 DIAGNOSIS — E440 Moderate protein-calorie malnutrition: Secondary | ICD-10-CM | POA: Diagnosis not present

## 2016-07-15 DIAGNOSIS — N2589 Other disorders resulting from impaired renal tubular function: Secondary | ICD-10-CM | POA: Diagnosis not present

## 2016-07-15 DIAGNOSIS — R17 Unspecified jaundice: Secondary | ICD-10-CM | POA: Diagnosis not present

## 2016-07-15 DIAGNOSIS — D509 Iron deficiency anemia, unspecified: Secondary | ICD-10-CM | POA: Diagnosis not present

## 2016-07-15 DIAGNOSIS — E440 Moderate protein-calorie malnutrition: Secondary | ICD-10-CM | POA: Diagnosis not present

## 2016-07-15 DIAGNOSIS — R8299 Other abnormal findings in urine: Secondary | ICD-10-CM | POA: Diagnosis not present

## 2016-07-15 DIAGNOSIS — N186 End stage renal disease: Secondary | ICD-10-CM | POA: Diagnosis not present

## 2016-07-15 DIAGNOSIS — Z79899 Other long term (current) drug therapy: Secondary | ICD-10-CM | POA: Diagnosis not present

## 2016-07-15 DIAGNOSIS — D631 Anemia in chronic kidney disease: Secondary | ICD-10-CM | POA: Diagnosis not present

## 2016-07-16 DIAGNOSIS — D631 Anemia in chronic kidney disease: Secondary | ICD-10-CM | POA: Diagnosis not present

## 2016-07-16 DIAGNOSIS — E440 Moderate protein-calorie malnutrition: Secondary | ICD-10-CM | POA: Diagnosis not present

## 2016-07-16 DIAGNOSIS — N186 End stage renal disease: Secondary | ICD-10-CM | POA: Diagnosis not present

## 2016-07-16 DIAGNOSIS — R17 Unspecified jaundice: Secondary | ICD-10-CM | POA: Diagnosis not present

## 2016-07-16 DIAGNOSIS — Z79899 Other long term (current) drug therapy: Secondary | ICD-10-CM | POA: Diagnosis not present

## 2016-07-16 DIAGNOSIS — R8299 Other abnormal findings in urine: Secondary | ICD-10-CM | POA: Diagnosis not present

## 2016-07-16 DIAGNOSIS — D509 Iron deficiency anemia, unspecified: Secondary | ICD-10-CM | POA: Diagnosis not present

## 2016-07-16 DIAGNOSIS — N2589 Other disorders resulting from impaired renal tubular function: Secondary | ICD-10-CM | POA: Diagnosis not present

## 2016-07-17 DIAGNOSIS — N2589 Other disorders resulting from impaired renal tubular function: Secondary | ICD-10-CM | POA: Diagnosis not present

## 2016-07-17 DIAGNOSIS — E440 Moderate protein-calorie malnutrition: Secondary | ICD-10-CM | POA: Diagnosis not present

## 2016-07-17 DIAGNOSIS — N186 End stage renal disease: Secondary | ICD-10-CM | POA: Diagnosis not present

## 2016-07-17 DIAGNOSIS — R8299 Other abnormal findings in urine: Secondary | ICD-10-CM | POA: Diagnosis not present

## 2016-07-17 DIAGNOSIS — Z79899 Other long term (current) drug therapy: Secondary | ICD-10-CM | POA: Diagnosis not present

## 2016-07-17 DIAGNOSIS — D509 Iron deficiency anemia, unspecified: Secondary | ICD-10-CM | POA: Diagnosis not present

## 2016-07-17 DIAGNOSIS — D631 Anemia in chronic kidney disease: Secondary | ICD-10-CM | POA: Diagnosis not present

## 2016-07-17 DIAGNOSIS — R17 Unspecified jaundice: Secondary | ICD-10-CM | POA: Diagnosis not present

## 2016-07-18 DIAGNOSIS — N2589 Other disorders resulting from impaired renal tubular function: Secondary | ICD-10-CM | POA: Diagnosis not present

## 2016-07-18 DIAGNOSIS — D631 Anemia in chronic kidney disease: Secondary | ICD-10-CM | POA: Diagnosis not present

## 2016-07-18 DIAGNOSIS — R8299 Other abnormal findings in urine: Secondary | ICD-10-CM | POA: Diagnosis not present

## 2016-07-18 DIAGNOSIS — N186 End stage renal disease: Secondary | ICD-10-CM | POA: Diagnosis not present

## 2016-07-18 DIAGNOSIS — R17 Unspecified jaundice: Secondary | ICD-10-CM | POA: Diagnosis not present

## 2016-07-18 DIAGNOSIS — E440 Moderate protein-calorie malnutrition: Secondary | ICD-10-CM | POA: Diagnosis not present

## 2016-07-18 DIAGNOSIS — D509 Iron deficiency anemia, unspecified: Secondary | ICD-10-CM | POA: Diagnosis not present

## 2016-07-18 DIAGNOSIS — Z79899 Other long term (current) drug therapy: Secondary | ICD-10-CM | POA: Diagnosis not present

## 2016-07-19 DIAGNOSIS — R17 Unspecified jaundice: Secondary | ICD-10-CM | POA: Diagnosis not present

## 2016-07-19 DIAGNOSIS — D509 Iron deficiency anemia, unspecified: Secondary | ICD-10-CM | POA: Diagnosis not present

## 2016-07-19 DIAGNOSIS — N2589 Other disorders resulting from impaired renal tubular function: Secondary | ICD-10-CM | POA: Diagnosis not present

## 2016-07-19 DIAGNOSIS — R8299 Other abnormal findings in urine: Secondary | ICD-10-CM | POA: Diagnosis not present

## 2016-07-19 DIAGNOSIS — D631 Anemia in chronic kidney disease: Secondary | ICD-10-CM | POA: Diagnosis not present

## 2016-07-19 DIAGNOSIS — Z79899 Other long term (current) drug therapy: Secondary | ICD-10-CM | POA: Diagnosis not present

## 2016-07-19 DIAGNOSIS — N186 End stage renal disease: Secondary | ICD-10-CM | POA: Diagnosis not present

## 2016-07-19 DIAGNOSIS — E440 Moderate protein-calorie malnutrition: Secondary | ICD-10-CM | POA: Diagnosis not present

## 2016-07-20 DIAGNOSIS — D509 Iron deficiency anemia, unspecified: Secondary | ICD-10-CM | POA: Diagnosis not present

## 2016-07-20 DIAGNOSIS — E1122 Type 2 diabetes mellitus with diabetic chronic kidney disease: Secondary | ICD-10-CM | POA: Diagnosis not present

## 2016-07-20 DIAGNOSIS — Z79899 Other long term (current) drug therapy: Secondary | ICD-10-CM | POA: Diagnosis not present

## 2016-07-20 DIAGNOSIS — N2589 Other disorders resulting from impaired renal tubular function: Secondary | ICD-10-CM | POA: Diagnosis not present

## 2016-07-20 DIAGNOSIS — Z992 Dependence on renal dialysis: Secondary | ICD-10-CM | POA: Diagnosis not present

## 2016-07-20 DIAGNOSIS — R17 Unspecified jaundice: Secondary | ICD-10-CM | POA: Diagnosis not present

## 2016-07-20 DIAGNOSIS — E440 Moderate protein-calorie malnutrition: Secondary | ICD-10-CM | POA: Diagnosis not present

## 2016-07-20 DIAGNOSIS — D631 Anemia in chronic kidney disease: Secondary | ICD-10-CM | POA: Diagnosis not present

## 2016-07-20 DIAGNOSIS — N186 End stage renal disease: Secondary | ICD-10-CM | POA: Diagnosis not present

## 2016-07-20 DIAGNOSIS — R8299 Other abnormal findings in urine: Secondary | ICD-10-CM | POA: Diagnosis not present

## 2016-07-21 DIAGNOSIS — R17 Unspecified jaundice: Secondary | ICD-10-CM | POA: Diagnosis not present

## 2016-07-21 DIAGNOSIS — N2581 Secondary hyperparathyroidism of renal origin: Secondary | ICD-10-CM | POA: Diagnosis not present

## 2016-07-21 DIAGNOSIS — R8299 Other abnormal findings in urine: Secondary | ICD-10-CM | POA: Diagnosis not present

## 2016-07-21 DIAGNOSIS — Z23 Encounter for immunization: Secondary | ICD-10-CM | POA: Diagnosis not present

## 2016-07-21 DIAGNOSIS — Z4932 Encounter for adequacy testing for peritoneal dialysis: Secondary | ICD-10-CM | POA: Diagnosis not present

## 2016-07-21 DIAGNOSIS — D509 Iron deficiency anemia, unspecified: Secondary | ICD-10-CM | POA: Diagnosis not present

## 2016-07-21 DIAGNOSIS — Z79899 Other long term (current) drug therapy: Secondary | ICD-10-CM | POA: Diagnosis not present

## 2016-07-21 DIAGNOSIS — N186 End stage renal disease: Secondary | ICD-10-CM | POA: Diagnosis not present

## 2016-07-21 DIAGNOSIS — E440 Moderate protein-calorie malnutrition: Secondary | ICD-10-CM | POA: Diagnosis not present

## 2016-07-22 DIAGNOSIS — Z23 Encounter for immunization: Secondary | ICD-10-CM | POA: Diagnosis not present

## 2016-07-22 DIAGNOSIS — Z4932 Encounter for adequacy testing for peritoneal dialysis: Secondary | ICD-10-CM | POA: Diagnosis not present

## 2016-07-22 DIAGNOSIS — N2581 Secondary hyperparathyroidism of renal origin: Secondary | ICD-10-CM | POA: Diagnosis not present

## 2016-07-22 DIAGNOSIS — D509 Iron deficiency anemia, unspecified: Secondary | ICD-10-CM | POA: Diagnosis not present

## 2016-07-22 DIAGNOSIS — R17 Unspecified jaundice: Secondary | ICD-10-CM | POA: Diagnosis not present

## 2016-07-22 DIAGNOSIS — R8299 Other abnormal findings in urine: Secondary | ICD-10-CM | POA: Diagnosis not present

## 2016-07-22 DIAGNOSIS — Z79899 Other long term (current) drug therapy: Secondary | ICD-10-CM | POA: Diagnosis not present

## 2016-07-22 DIAGNOSIS — N186 End stage renal disease: Secondary | ICD-10-CM | POA: Diagnosis not present

## 2016-07-22 DIAGNOSIS — E440 Moderate protein-calorie malnutrition: Secondary | ICD-10-CM | POA: Diagnosis not present

## 2016-07-23 DIAGNOSIS — Z23 Encounter for immunization: Secondary | ICD-10-CM | POA: Diagnosis not present

## 2016-07-23 DIAGNOSIS — Z79899 Other long term (current) drug therapy: Secondary | ICD-10-CM | POA: Diagnosis not present

## 2016-07-23 DIAGNOSIS — N2581 Secondary hyperparathyroidism of renal origin: Secondary | ICD-10-CM | POA: Diagnosis not present

## 2016-07-23 DIAGNOSIS — Z4932 Encounter for adequacy testing for peritoneal dialysis: Secondary | ICD-10-CM | POA: Diagnosis not present

## 2016-07-23 DIAGNOSIS — D509 Iron deficiency anemia, unspecified: Secondary | ICD-10-CM | POA: Diagnosis not present

## 2016-07-23 DIAGNOSIS — E440 Moderate protein-calorie malnutrition: Secondary | ICD-10-CM | POA: Diagnosis not present

## 2016-07-23 DIAGNOSIS — R8299 Other abnormal findings in urine: Secondary | ICD-10-CM | POA: Diagnosis not present

## 2016-07-23 DIAGNOSIS — N186 End stage renal disease: Secondary | ICD-10-CM | POA: Diagnosis not present

## 2016-07-23 DIAGNOSIS — R17 Unspecified jaundice: Secondary | ICD-10-CM | POA: Diagnosis not present

## 2016-07-24 DIAGNOSIS — D509 Iron deficiency anemia, unspecified: Secondary | ICD-10-CM | POA: Diagnosis not present

## 2016-07-24 DIAGNOSIS — R17 Unspecified jaundice: Secondary | ICD-10-CM | POA: Diagnosis not present

## 2016-07-24 DIAGNOSIS — E440 Moderate protein-calorie malnutrition: Secondary | ICD-10-CM | POA: Diagnosis not present

## 2016-07-24 DIAGNOSIS — N2581 Secondary hyperparathyroidism of renal origin: Secondary | ICD-10-CM | POA: Diagnosis not present

## 2016-07-24 DIAGNOSIS — Z4932 Encounter for adequacy testing for peritoneal dialysis: Secondary | ICD-10-CM | POA: Diagnosis not present

## 2016-07-24 DIAGNOSIS — Z79899 Other long term (current) drug therapy: Secondary | ICD-10-CM | POA: Diagnosis not present

## 2016-07-24 DIAGNOSIS — N186 End stage renal disease: Secondary | ICD-10-CM | POA: Diagnosis not present

## 2016-07-24 DIAGNOSIS — R8299 Other abnormal findings in urine: Secondary | ICD-10-CM | POA: Diagnosis not present

## 2016-07-24 DIAGNOSIS — Z23 Encounter for immunization: Secondary | ICD-10-CM | POA: Diagnosis not present

## 2016-07-25 DIAGNOSIS — N2581 Secondary hyperparathyroidism of renal origin: Secondary | ICD-10-CM | POA: Diagnosis not present

## 2016-07-25 DIAGNOSIS — Z23 Encounter for immunization: Secondary | ICD-10-CM | POA: Diagnosis not present

## 2016-07-25 DIAGNOSIS — Z79899 Other long term (current) drug therapy: Secondary | ICD-10-CM | POA: Diagnosis not present

## 2016-07-25 DIAGNOSIS — N186 End stage renal disease: Secondary | ICD-10-CM | POA: Diagnosis not present

## 2016-07-25 DIAGNOSIS — D509 Iron deficiency anemia, unspecified: Secondary | ICD-10-CM | POA: Diagnosis not present

## 2016-07-25 DIAGNOSIS — E440 Moderate protein-calorie malnutrition: Secondary | ICD-10-CM | POA: Diagnosis not present

## 2016-07-25 DIAGNOSIS — Z4932 Encounter for adequacy testing for peritoneal dialysis: Secondary | ICD-10-CM | POA: Diagnosis not present

## 2016-07-25 DIAGNOSIS — R8299 Other abnormal findings in urine: Secondary | ICD-10-CM | POA: Diagnosis not present

## 2016-07-25 DIAGNOSIS — R17 Unspecified jaundice: Secondary | ICD-10-CM | POA: Diagnosis not present

## 2016-07-26 DIAGNOSIS — Z4932 Encounter for adequacy testing for peritoneal dialysis: Secondary | ICD-10-CM | POA: Diagnosis not present

## 2016-07-26 DIAGNOSIS — N2581 Secondary hyperparathyroidism of renal origin: Secondary | ICD-10-CM | POA: Diagnosis not present

## 2016-07-26 DIAGNOSIS — Z79899 Other long term (current) drug therapy: Secondary | ICD-10-CM | POA: Diagnosis not present

## 2016-07-26 DIAGNOSIS — E440 Moderate protein-calorie malnutrition: Secondary | ICD-10-CM | POA: Diagnosis not present

## 2016-07-26 DIAGNOSIS — N186 End stage renal disease: Secondary | ICD-10-CM | POA: Diagnosis not present

## 2016-07-26 DIAGNOSIS — Z23 Encounter for immunization: Secondary | ICD-10-CM | POA: Diagnosis not present

## 2016-07-26 DIAGNOSIS — R17 Unspecified jaundice: Secondary | ICD-10-CM | POA: Diagnosis not present

## 2016-07-26 DIAGNOSIS — D509 Iron deficiency anemia, unspecified: Secondary | ICD-10-CM | POA: Diagnosis not present

## 2016-07-26 DIAGNOSIS — R8299 Other abnormal findings in urine: Secondary | ICD-10-CM | POA: Diagnosis not present

## 2016-07-27 ENCOUNTER — Other Ambulatory Visit: Payer: Self-pay | Admitting: *Deleted

## 2016-07-27 ENCOUNTER — Other Ambulatory Visit: Payer: Self-pay | Admitting: Internal Medicine

## 2016-07-27 ENCOUNTER — Other Ambulatory Visit: Payer: Self-pay

## 2016-07-27 DIAGNOSIS — Z23 Encounter for immunization: Secondary | ICD-10-CM | POA: Diagnosis not present

## 2016-07-27 DIAGNOSIS — N2581 Secondary hyperparathyroidism of renal origin: Secondary | ICD-10-CM | POA: Diagnosis not present

## 2016-07-27 DIAGNOSIS — D509 Iron deficiency anemia, unspecified: Secondary | ICD-10-CM | POA: Diagnosis not present

## 2016-07-27 DIAGNOSIS — R17 Unspecified jaundice: Secondary | ICD-10-CM | POA: Diagnosis not present

## 2016-07-27 DIAGNOSIS — N186 End stage renal disease: Secondary | ICD-10-CM | POA: Diagnosis not present

## 2016-07-27 DIAGNOSIS — Z79899 Other long term (current) drug therapy: Secondary | ICD-10-CM | POA: Diagnosis not present

## 2016-07-27 DIAGNOSIS — E440 Moderate protein-calorie malnutrition: Secondary | ICD-10-CM | POA: Diagnosis not present

## 2016-07-27 DIAGNOSIS — Z4932 Encounter for adequacy testing for peritoneal dialysis: Secondary | ICD-10-CM | POA: Diagnosis not present

## 2016-07-27 DIAGNOSIS — R8299 Other abnormal findings in urine: Secondary | ICD-10-CM | POA: Diagnosis not present

## 2016-07-27 MED ORDER — LABETALOL HCL 200 MG PO TABS: 200.0000 mg | ORAL_TABLET | Freq: Two times a day (BID) | ORAL | 1 refills | Status: DC

## 2016-07-27 MED ORDER — HYDRALAZINE HCL 10 MG PO TABS: ORAL_TABLET | ORAL | 0 refills | Status: DC

## 2016-07-28 DIAGNOSIS — Z79899 Other long term (current) drug therapy: Secondary | ICD-10-CM | POA: Diagnosis not present

## 2016-07-28 DIAGNOSIS — R17 Unspecified jaundice: Secondary | ICD-10-CM | POA: Diagnosis not present

## 2016-07-28 DIAGNOSIS — N2581 Secondary hyperparathyroidism of renal origin: Secondary | ICD-10-CM | POA: Diagnosis not present

## 2016-07-28 DIAGNOSIS — Z4932 Encounter for adequacy testing for peritoneal dialysis: Secondary | ICD-10-CM | POA: Diagnosis not present

## 2016-07-28 DIAGNOSIS — R8299 Other abnormal findings in urine: Secondary | ICD-10-CM | POA: Diagnosis not present

## 2016-07-28 DIAGNOSIS — Z23 Encounter for immunization: Secondary | ICD-10-CM | POA: Diagnosis not present

## 2016-07-28 DIAGNOSIS — D509 Iron deficiency anemia, unspecified: Secondary | ICD-10-CM | POA: Diagnosis not present

## 2016-07-28 DIAGNOSIS — N186 End stage renal disease: Secondary | ICD-10-CM | POA: Diagnosis not present

## 2016-07-28 DIAGNOSIS — E440 Moderate protein-calorie malnutrition: Secondary | ICD-10-CM | POA: Diagnosis not present

## 2016-07-29 DIAGNOSIS — Z4932 Encounter for adequacy testing for peritoneal dialysis: Secondary | ICD-10-CM | POA: Diagnosis not present

## 2016-07-29 DIAGNOSIS — R17 Unspecified jaundice: Secondary | ICD-10-CM | POA: Diagnosis not present

## 2016-07-29 DIAGNOSIS — N186 End stage renal disease: Secondary | ICD-10-CM | POA: Diagnosis not present

## 2016-07-29 DIAGNOSIS — N2581 Secondary hyperparathyroidism of renal origin: Secondary | ICD-10-CM | POA: Diagnosis not present

## 2016-07-29 DIAGNOSIS — R8299 Other abnormal findings in urine: Secondary | ICD-10-CM | POA: Diagnosis not present

## 2016-07-29 DIAGNOSIS — E440 Moderate protein-calorie malnutrition: Secondary | ICD-10-CM | POA: Diagnosis not present

## 2016-07-29 DIAGNOSIS — Z79899 Other long term (current) drug therapy: Secondary | ICD-10-CM | POA: Diagnosis not present

## 2016-07-29 DIAGNOSIS — D509 Iron deficiency anemia, unspecified: Secondary | ICD-10-CM | POA: Diagnosis not present

## 2016-07-29 DIAGNOSIS — Z23 Encounter for immunization: Secondary | ICD-10-CM | POA: Diagnosis not present

## 2016-07-30 DIAGNOSIS — N2581 Secondary hyperparathyroidism of renal origin: Secondary | ICD-10-CM | POA: Diagnosis not present

## 2016-07-30 DIAGNOSIS — Z79899 Other long term (current) drug therapy: Secondary | ICD-10-CM | POA: Diagnosis not present

## 2016-07-30 DIAGNOSIS — E440 Moderate protein-calorie malnutrition: Secondary | ICD-10-CM | POA: Diagnosis not present

## 2016-07-30 DIAGNOSIS — R17 Unspecified jaundice: Secondary | ICD-10-CM | POA: Diagnosis not present

## 2016-07-30 DIAGNOSIS — D509 Iron deficiency anemia, unspecified: Secondary | ICD-10-CM | POA: Diagnosis not present

## 2016-07-30 DIAGNOSIS — Z23 Encounter for immunization: Secondary | ICD-10-CM | POA: Diagnosis not present

## 2016-07-30 DIAGNOSIS — R8299 Other abnormal findings in urine: Secondary | ICD-10-CM | POA: Diagnosis not present

## 2016-07-30 DIAGNOSIS — Z4932 Encounter for adequacy testing for peritoneal dialysis: Secondary | ICD-10-CM | POA: Diagnosis not present

## 2016-07-30 DIAGNOSIS — N186 End stage renal disease: Secondary | ICD-10-CM | POA: Diagnosis not present

## 2016-07-31 DIAGNOSIS — Z23 Encounter for immunization: Secondary | ICD-10-CM | POA: Diagnosis not present

## 2016-07-31 DIAGNOSIS — E440 Moderate protein-calorie malnutrition: Secondary | ICD-10-CM | POA: Diagnosis not present

## 2016-07-31 DIAGNOSIS — R8299 Other abnormal findings in urine: Secondary | ICD-10-CM | POA: Diagnosis not present

## 2016-07-31 DIAGNOSIS — Z79899 Other long term (current) drug therapy: Secondary | ICD-10-CM | POA: Diagnosis not present

## 2016-07-31 DIAGNOSIS — D509 Iron deficiency anemia, unspecified: Secondary | ICD-10-CM | POA: Diagnosis not present

## 2016-07-31 DIAGNOSIS — Z4932 Encounter for adequacy testing for peritoneal dialysis: Secondary | ICD-10-CM | POA: Diagnosis not present

## 2016-07-31 DIAGNOSIS — R17 Unspecified jaundice: Secondary | ICD-10-CM | POA: Diagnosis not present

## 2016-07-31 DIAGNOSIS — N186 End stage renal disease: Secondary | ICD-10-CM | POA: Diagnosis not present

## 2016-07-31 DIAGNOSIS — N2581 Secondary hyperparathyroidism of renal origin: Secondary | ICD-10-CM | POA: Diagnosis not present

## 2016-08-01 DIAGNOSIS — E440 Moderate protein-calorie malnutrition: Secondary | ICD-10-CM | POA: Diagnosis not present

## 2016-08-01 DIAGNOSIS — R17 Unspecified jaundice: Secondary | ICD-10-CM | POA: Diagnosis not present

## 2016-08-01 DIAGNOSIS — Z79899 Other long term (current) drug therapy: Secondary | ICD-10-CM | POA: Diagnosis not present

## 2016-08-01 DIAGNOSIS — R8299 Other abnormal findings in urine: Secondary | ICD-10-CM | POA: Diagnosis not present

## 2016-08-01 DIAGNOSIS — Z4932 Encounter for adequacy testing for peritoneal dialysis: Secondary | ICD-10-CM | POA: Diagnosis not present

## 2016-08-01 DIAGNOSIS — N2581 Secondary hyperparathyroidism of renal origin: Secondary | ICD-10-CM | POA: Diagnosis not present

## 2016-08-01 DIAGNOSIS — N186 End stage renal disease: Secondary | ICD-10-CM | POA: Diagnosis not present

## 2016-08-01 DIAGNOSIS — D509 Iron deficiency anemia, unspecified: Secondary | ICD-10-CM | POA: Diagnosis not present

## 2016-08-01 DIAGNOSIS — Z23 Encounter for immunization: Secondary | ICD-10-CM | POA: Diagnosis not present

## 2016-08-02 DIAGNOSIS — R8299 Other abnormal findings in urine: Secondary | ICD-10-CM | POA: Diagnosis not present

## 2016-08-02 DIAGNOSIS — Z23 Encounter for immunization: Secondary | ICD-10-CM | POA: Diagnosis not present

## 2016-08-02 DIAGNOSIS — D509 Iron deficiency anemia, unspecified: Secondary | ICD-10-CM | POA: Diagnosis not present

## 2016-08-02 DIAGNOSIS — E440 Moderate protein-calorie malnutrition: Secondary | ICD-10-CM | POA: Diagnosis not present

## 2016-08-02 DIAGNOSIS — R17 Unspecified jaundice: Secondary | ICD-10-CM | POA: Diagnosis not present

## 2016-08-02 DIAGNOSIS — Z4932 Encounter for adequacy testing for peritoneal dialysis: Secondary | ICD-10-CM | POA: Diagnosis not present

## 2016-08-02 DIAGNOSIS — N186 End stage renal disease: Secondary | ICD-10-CM | POA: Diagnosis not present

## 2016-08-02 DIAGNOSIS — Z79899 Other long term (current) drug therapy: Secondary | ICD-10-CM | POA: Diagnosis not present

## 2016-08-02 DIAGNOSIS — N2581 Secondary hyperparathyroidism of renal origin: Secondary | ICD-10-CM | POA: Diagnosis not present

## 2016-08-03 DIAGNOSIS — R17 Unspecified jaundice: Secondary | ICD-10-CM | POA: Diagnosis not present

## 2016-08-03 DIAGNOSIS — R8299 Other abnormal findings in urine: Secondary | ICD-10-CM | POA: Diagnosis not present

## 2016-08-03 DIAGNOSIS — N2581 Secondary hyperparathyroidism of renal origin: Secondary | ICD-10-CM | POA: Diagnosis not present

## 2016-08-03 DIAGNOSIS — N186 End stage renal disease: Secondary | ICD-10-CM | POA: Diagnosis not present

## 2016-08-03 DIAGNOSIS — Z4932 Encounter for adequacy testing for peritoneal dialysis: Secondary | ICD-10-CM | POA: Diagnosis not present

## 2016-08-03 DIAGNOSIS — Z23 Encounter for immunization: Secondary | ICD-10-CM | POA: Diagnosis not present

## 2016-08-03 DIAGNOSIS — E440 Moderate protein-calorie malnutrition: Secondary | ICD-10-CM | POA: Diagnosis not present

## 2016-08-03 DIAGNOSIS — Z79899 Other long term (current) drug therapy: Secondary | ICD-10-CM | POA: Diagnosis not present

## 2016-08-03 DIAGNOSIS — D509 Iron deficiency anemia, unspecified: Secondary | ICD-10-CM | POA: Diagnosis not present

## 2016-08-04 DIAGNOSIS — Z4932 Encounter for adequacy testing for peritoneal dialysis: Secondary | ICD-10-CM | POA: Diagnosis not present

## 2016-08-04 DIAGNOSIS — R17 Unspecified jaundice: Secondary | ICD-10-CM | POA: Diagnosis not present

## 2016-08-04 DIAGNOSIS — N2581 Secondary hyperparathyroidism of renal origin: Secondary | ICD-10-CM | POA: Diagnosis not present

## 2016-08-04 DIAGNOSIS — D509 Iron deficiency anemia, unspecified: Secondary | ICD-10-CM | POA: Diagnosis not present

## 2016-08-04 DIAGNOSIS — R8299 Other abnormal findings in urine: Secondary | ICD-10-CM | POA: Diagnosis not present

## 2016-08-04 DIAGNOSIS — N186 End stage renal disease: Secondary | ICD-10-CM | POA: Diagnosis not present

## 2016-08-04 DIAGNOSIS — E440 Moderate protein-calorie malnutrition: Secondary | ICD-10-CM | POA: Diagnosis not present

## 2016-08-04 DIAGNOSIS — Z79899 Other long term (current) drug therapy: Secondary | ICD-10-CM | POA: Diagnosis not present

## 2016-08-04 DIAGNOSIS — Z23 Encounter for immunization: Secondary | ICD-10-CM | POA: Diagnosis not present

## 2016-08-05 DIAGNOSIS — N2581 Secondary hyperparathyroidism of renal origin: Secondary | ICD-10-CM | POA: Diagnosis not present

## 2016-08-05 DIAGNOSIS — R17 Unspecified jaundice: Secondary | ICD-10-CM | POA: Diagnosis not present

## 2016-08-05 DIAGNOSIS — D509 Iron deficiency anemia, unspecified: Secondary | ICD-10-CM | POA: Diagnosis not present

## 2016-08-05 DIAGNOSIS — R8299 Other abnormal findings in urine: Secondary | ICD-10-CM | POA: Diagnosis not present

## 2016-08-05 DIAGNOSIS — Z79899 Other long term (current) drug therapy: Secondary | ICD-10-CM | POA: Diagnosis not present

## 2016-08-05 DIAGNOSIS — E440 Moderate protein-calorie malnutrition: Secondary | ICD-10-CM | POA: Diagnosis not present

## 2016-08-05 DIAGNOSIS — N186 End stage renal disease: Secondary | ICD-10-CM | POA: Diagnosis not present

## 2016-08-05 DIAGNOSIS — Z4932 Encounter for adequacy testing for peritoneal dialysis: Secondary | ICD-10-CM | POA: Diagnosis not present

## 2016-08-05 DIAGNOSIS — Z23 Encounter for immunization: Secondary | ICD-10-CM | POA: Diagnosis not present

## 2016-08-06 DIAGNOSIS — N2581 Secondary hyperparathyroidism of renal origin: Secondary | ICD-10-CM | POA: Diagnosis not present

## 2016-08-06 DIAGNOSIS — E440 Moderate protein-calorie malnutrition: Secondary | ICD-10-CM | POA: Diagnosis not present

## 2016-08-06 DIAGNOSIS — N186 End stage renal disease: Secondary | ICD-10-CM | POA: Diagnosis not present

## 2016-08-06 DIAGNOSIS — Z4932 Encounter for adequacy testing for peritoneal dialysis: Secondary | ICD-10-CM | POA: Diagnosis not present

## 2016-08-06 DIAGNOSIS — D509 Iron deficiency anemia, unspecified: Secondary | ICD-10-CM | POA: Diagnosis not present

## 2016-08-06 DIAGNOSIS — R8299 Other abnormal findings in urine: Secondary | ICD-10-CM | POA: Diagnosis not present

## 2016-08-06 DIAGNOSIS — Z79899 Other long term (current) drug therapy: Secondary | ICD-10-CM | POA: Diagnosis not present

## 2016-08-06 DIAGNOSIS — R17 Unspecified jaundice: Secondary | ICD-10-CM | POA: Diagnosis not present

## 2016-08-06 DIAGNOSIS — Z23 Encounter for immunization: Secondary | ICD-10-CM | POA: Diagnosis not present

## 2016-08-07 DIAGNOSIS — Z79899 Other long term (current) drug therapy: Secondary | ICD-10-CM | POA: Diagnosis not present

## 2016-08-07 DIAGNOSIS — R17 Unspecified jaundice: Secondary | ICD-10-CM | POA: Diagnosis not present

## 2016-08-07 DIAGNOSIS — D509 Iron deficiency anemia, unspecified: Secondary | ICD-10-CM | POA: Diagnosis not present

## 2016-08-07 DIAGNOSIS — Z4932 Encounter for adequacy testing for peritoneal dialysis: Secondary | ICD-10-CM | POA: Diagnosis not present

## 2016-08-07 DIAGNOSIS — R8299 Other abnormal findings in urine: Secondary | ICD-10-CM | POA: Diagnosis not present

## 2016-08-07 DIAGNOSIS — N186 End stage renal disease: Secondary | ICD-10-CM | POA: Diagnosis not present

## 2016-08-07 DIAGNOSIS — E440 Moderate protein-calorie malnutrition: Secondary | ICD-10-CM | POA: Diagnosis not present

## 2016-08-07 DIAGNOSIS — N2581 Secondary hyperparathyroidism of renal origin: Secondary | ICD-10-CM | POA: Diagnosis not present

## 2016-08-07 DIAGNOSIS — Z23 Encounter for immunization: Secondary | ICD-10-CM | POA: Diagnosis not present

## 2016-08-08 DIAGNOSIS — N2581 Secondary hyperparathyroidism of renal origin: Secondary | ICD-10-CM | POA: Diagnosis not present

## 2016-08-08 DIAGNOSIS — E440 Moderate protein-calorie malnutrition: Secondary | ICD-10-CM | POA: Diagnosis not present

## 2016-08-08 DIAGNOSIS — D509 Iron deficiency anemia, unspecified: Secondary | ICD-10-CM | POA: Diagnosis not present

## 2016-08-08 DIAGNOSIS — Z4932 Encounter for adequacy testing for peritoneal dialysis: Secondary | ICD-10-CM | POA: Diagnosis not present

## 2016-08-08 DIAGNOSIS — Z79899 Other long term (current) drug therapy: Secondary | ICD-10-CM | POA: Diagnosis not present

## 2016-08-08 DIAGNOSIS — R8299 Other abnormal findings in urine: Secondary | ICD-10-CM | POA: Diagnosis not present

## 2016-08-08 DIAGNOSIS — R17 Unspecified jaundice: Secondary | ICD-10-CM | POA: Diagnosis not present

## 2016-08-08 DIAGNOSIS — Z23 Encounter for immunization: Secondary | ICD-10-CM | POA: Diagnosis not present

## 2016-08-08 DIAGNOSIS — N186 End stage renal disease: Secondary | ICD-10-CM | POA: Diagnosis not present

## 2016-08-09 DIAGNOSIS — Z79899 Other long term (current) drug therapy: Secondary | ICD-10-CM | POA: Diagnosis not present

## 2016-08-09 DIAGNOSIS — Z4932 Encounter for adequacy testing for peritoneal dialysis: Secondary | ICD-10-CM | POA: Diagnosis not present

## 2016-08-09 DIAGNOSIS — R8299 Other abnormal findings in urine: Secondary | ICD-10-CM | POA: Diagnosis not present

## 2016-08-09 DIAGNOSIS — Z23 Encounter for immunization: Secondary | ICD-10-CM | POA: Diagnosis not present

## 2016-08-09 DIAGNOSIS — N186 End stage renal disease: Secondary | ICD-10-CM | POA: Diagnosis not present

## 2016-08-09 DIAGNOSIS — E440 Moderate protein-calorie malnutrition: Secondary | ICD-10-CM | POA: Diagnosis not present

## 2016-08-09 DIAGNOSIS — R17 Unspecified jaundice: Secondary | ICD-10-CM | POA: Diagnosis not present

## 2016-08-09 DIAGNOSIS — D509 Iron deficiency anemia, unspecified: Secondary | ICD-10-CM | POA: Diagnosis not present

## 2016-08-09 DIAGNOSIS — N2581 Secondary hyperparathyroidism of renal origin: Secondary | ICD-10-CM | POA: Diagnosis not present

## 2016-08-10 DIAGNOSIS — R17 Unspecified jaundice: Secondary | ICD-10-CM | POA: Diagnosis not present

## 2016-08-10 DIAGNOSIS — N186 End stage renal disease: Secondary | ICD-10-CM | POA: Diagnosis not present

## 2016-08-10 DIAGNOSIS — Z4932 Encounter for adequacy testing for peritoneal dialysis: Secondary | ICD-10-CM | POA: Diagnosis not present

## 2016-08-10 DIAGNOSIS — E440 Moderate protein-calorie malnutrition: Secondary | ICD-10-CM | POA: Diagnosis not present

## 2016-08-10 DIAGNOSIS — R8299 Other abnormal findings in urine: Secondary | ICD-10-CM | POA: Diagnosis not present

## 2016-08-10 DIAGNOSIS — Z23 Encounter for immunization: Secondary | ICD-10-CM | POA: Diagnosis not present

## 2016-08-10 DIAGNOSIS — Z79899 Other long term (current) drug therapy: Secondary | ICD-10-CM | POA: Diagnosis not present

## 2016-08-10 DIAGNOSIS — N2581 Secondary hyperparathyroidism of renal origin: Secondary | ICD-10-CM | POA: Diagnosis not present

## 2016-08-10 DIAGNOSIS — D509 Iron deficiency anemia, unspecified: Secondary | ICD-10-CM | POA: Diagnosis not present

## 2016-08-11 DIAGNOSIS — D509 Iron deficiency anemia, unspecified: Secondary | ICD-10-CM | POA: Diagnosis not present

## 2016-08-11 DIAGNOSIS — Z4932 Encounter for adequacy testing for peritoneal dialysis: Secondary | ICD-10-CM | POA: Diagnosis not present

## 2016-08-11 DIAGNOSIS — Z23 Encounter for immunization: Secondary | ICD-10-CM | POA: Diagnosis not present

## 2016-08-11 DIAGNOSIS — N186 End stage renal disease: Secondary | ICD-10-CM | POA: Diagnosis not present

## 2016-08-11 DIAGNOSIS — N2581 Secondary hyperparathyroidism of renal origin: Secondary | ICD-10-CM | POA: Diagnosis not present

## 2016-08-11 DIAGNOSIS — R8299 Other abnormal findings in urine: Secondary | ICD-10-CM | POA: Diagnosis not present

## 2016-08-11 DIAGNOSIS — R17 Unspecified jaundice: Secondary | ICD-10-CM | POA: Diagnosis not present

## 2016-08-11 DIAGNOSIS — E440 Moderate protein-calorie malnutrition: Secondary | ICD-10-CM | POA: Diagnosis not present

## 2016-08-11 DIAGNOSIS — Z79899 Other long term (current) drug therapy: Secondary | ICD-10-CM | POA: Diagnosis not present

## 2016-08-12 DIAGNOSIS — Z4932 Encounter for adequacy testing for peritoneal dialysis: Secondary | ICD-10-CM | POA: Diagnosis not present

## 2016-08-12 DIAGNOSIS — R8299 Other abnormal findings in urine: Secondary | ICD-10-CM | POA: Diagnosis not present

## 2016-08-12 DIAGNOSIS — E440 Moderate protein-calorie malnutrition: Secondary | ICD-10-CM | POA: Diagnosis not present

## 2016-08-12 DIAGNOSIS — N186 End stage renal disease: Secondary | ICD-10-CM | POA: Diagnosis not present

## 2016-08-12 DIAGNOSIS — N2581 Secondary hyperparathyroidism of renal origin: Secondary | ICD-10-CM | POA: Diagnosis not present

## 2016-08-12 DIAGNOSIS — R17 Unspecified jaundice: Secondary | ICD-10-CM | POA: Diagnosis not present

## 2016-08-12 DIAGNOSIS — Z79899 Other long term (current) drug therapy: Secondary | ICD-10-CM | POA: Diagnosis not present

## 2016-08-12 DIAGNOSIS — D509 Iron deficiency anemia, unspecified: Secondary | ICD-10-CM | POA: Diagnosis not present

## 2016-08-12 DIAGNOSIS — Z23 Encounter for immunization: Secondary | ICD-10-CM | POA: Diagnosis not present

## 2016-08-13 DIAGNOSIS — N186 End stage renal disease: Secondary | ICD-10-CM | POA: Diagnosis not present

## 2016-08-13 DIAGNOSIS — Z23 Encounter for immunization: Secondary | ICD-10-CM | POA: Diagnosis not present

## 2016-08-13 DIAGNOSIS — N2581 Secondary hyperparathyroidism of renal origin: Secondary | ICD-10-CM | POA: Diagnosis not present

## 2016-08-13 DIAGNOSIS — R8299 Other abnormal findings in urine: Secondary | ICD-10-CM | POA: Diagnosis not present

## 2016-08-13 DIAGNOSIS — Z4932 Encounter for adequacy testing for peritoneal dialysis: Secondary | ICD-10-CM | POA: Diagnosis not present

## 2016-08-13 DIAGNOSIS — D509 Iron deficiency anemia, unspecified: Secondary | ICD-10-CM | POA: Diagnosis not present

## 2016-08-13 DIAGNOSIS — R17 Unspecified jaundice: Secondary | ICD-10-CM | POA: Diagnosis not present

## 2016-08-13 DIAGNOSIS — E440 Moderate protein-calorie malnutrition: Secondary | ICD-10-CM | POA: Diagnosis not present

## 2016-08-13 DIAGNOSIS — Z79899 Other long term (current) drug therapy: Secondary | ICD-10-CM | POA: Diagnosis not present

## 2016-08-14 DIAGNOSIS — R17 Unspecified jaundice: Secondary | ICD-10-CM | POA: Diagnosis not present

## 2016-08-14 DIAGNOSIS — N186 End stage renal disease: Secondary | ICD-10-CM | POA: Diagnosis not present

## 2016-08-14 DIAGNOSIS — E440 Moderate protein-calorie malnutrition: Secondary | ICD-10-CM | POA: Diagnosis not present

## 2016-08-14 DIAGNOSIS — D509 Iron deficiency anemia, unspecified: Secondary | ICD-10-CM | POA: Diagnosis not present

## 2016-08-14 DIAGNOSIS — Z23 Encounter for immunization: Secondary | ICD-10-CM | POA: Diagnosis not present

## 2016-08-14 DIAGNOSIS — Z4932 Encounter for adequacy testing for peritoneal dialysis: Secondary | ICD-10-CM | POA: Diagnosis not present

## 2016-08-14 DIAGNOSIS — N2581 Secondary hyperparathyroidism of renal origin: Secondary | ICD-10-CM | POA: Diagnosis not present

## 2016-08-14 DIAGNOSIS — R8299 Other abnormal findings in urine: Secondary | ICD-10-CM | POA: Diagnosis not present

## 2016-08-14 DIAGNOSIS — Z79899 Other long term (current) drug therapy: Secondary | ICD-10-CM | POA: Diagnosis not present

## 2016-08-15 DIAGNOSIS — Z4932 Encounter for adequacy testing for peritoneal dialysis: Secondary | ICD-10-CM | POA: Diagnosis not present

## 2016-08-15 DIAGNOSIS — Z79899 Other long term (current) drug therapy: Secondary | ICD-10-CM | POA: Diagnosis not present

## 2016-08-15 DIAGNOSIS — R17 Unspecified jaundice: Secondary | ICD-10-CM | POA: Diagnosis not present

## 2016-08-15 DIAGNOSIS — E440 Moderate protein-calorie malnutrition: Secondary | ICD-10-CM | POA: Diagnosis not present

## 2016-08-15 DIAGNOSIS — D509 Iron deficiency anemia, unspecified: Secondary | ICD-10-CM | POA: Diagnosis not present

## 2016-08-15 DIAGNOSIS — N2581 Secondary hyperparathyroidism of renal origin: Secondary | ICD-10-CM | POA: Diagnosis not present

## 2016-08-15 DIAGNOSIS — Z23 Encounter for immunization: Secondary | ICD-10-CM | POA: Diagnosis not present

## 2016-08-15 DIAGNOSIS — R8299 Other abnormal findings in urine: Secondary | ICD-10-CM | POA: Diagnosis not present

## 2016-08-15 DIAGNOSIS — N186 End stage renal disease: Secondary | ICD-10-CM | POA: Diagnosis not present

## 2016-08-16 DIAGNOSIS — Z79899 Other long term (current) drug therapy: Secondary | ICD-10-CM | POA: Diagnosis not present

## 2016-08-16 DIAGNOSIS — D509 Iron deficiency anemia, unspecified: Secondary | ICD-10-CM | POA: Diagnosis not present

## 2016-08-16 DIAGNOSIS — R17 Unspecified jaundice: Secondary | ICD-10-CM | POA: Diagnosis not present

## 2016-08-16 DIAGNOSIS — R8299 Other abnormal findings in urine: Secondary | ICD-10-CM | POA: Diagnosis not present

## 2016-08-16 DIAGNOSIS — N186 End stage renal disease: Secondary | ICD-10-CM | POA: Diagnosis not present

## 2016-08-16 DIAGNOSIS — N2581 Secondary hyperparathyroidism of renal origin: Secondary | ICD-10-CM | POA: Diagnosis not present

## 2016-08-16 DIAGNOSIS — Z4932 Encounter for adequacy testing for peritoneal dialysis: Secondary | ICD-10-CM | POA: Diagnosis not present

## 2016-08-16 DIAGNOSIS — Z23 Encounter for immunization: Secondary | ICD-10-CM | POA: Diagnosis not present

## 2016-08-16 DIAGNOSIS — E440 Moderate protein-calorie malnutrition: Secondary | ICD-10-CM | POA: Diagnosis not present

## 2016-08-17 DIAGNOSIS — Z23 Encounter for immunization: Secondary | ICD-10-CM | POA: Diagnosis not present

## 2016-08-17 DIAGNOSIS — R17 Unspecified jaundice: Secondary | ICD-10-CM | POA: Diagnosis not present

## 2016-08-17 DIAGNOSIS — N2581 Secondary hyperparathyroidism of renal origin: Secondary | ICD-10-CM | POA: Diagnosis not present

## 2016-08-17 DIAGNOSIS — N186 End stage renal disease: Secondary | ICD-10-CM | POA: Diagnosis not present

## 2016-08-17 DIAGNOSIS — R8299 Other abnormal findings in urine: Secondary | ICD-10-CM | POA: Diagnosis not present

## 2016-08-17 DIAGNOSIS — E440 Moderate protein-calorie malnutrition: Secondary | ICD-10-CM | POA: Diagnosis not present

## 2016-08-17 DIAGNOSIS — D509 Iron deficiency anemia, unspecified: Secondary | ICD-10-CM | POA: Diagnosis not present

## 2016-08-17 DIAGNOSIS — Z79899 Other long term (current) drug therapy: Secondary | ICD-10-CM | POA: Diagnosis not present

## 2016-08-17 DIAGNOSIS — Z4932 Encounter for adequacy testing for peritoneal dialysis: Secondary | ICD-10-CM | POA: Diagnosis not present

## 2016-08-18 DIAGNOSIS — R8299 Other abnormal findings in urine: Secondary | ICD-10-CM | POA: Diagnosis not present

## 2016-08-18 DIAGNOSIS — Z79899 Other long term (current) drug therapy: Secondary | ICD-10-CM | POA: Diagnosis not present

## 2016-08-18 DIAGNOSIS — R17 Unspecified jaundice: Secondary | ICD-10-CM | POA: Diagnosis not present

## 2016-08-18 DIAGNOSIS — D509 Iron deficiency anemia, unspecified: Secondary | ICD-10-CM | POA: Diagnosis not present

## 2016-08-18 DIAGNOSIS — Z4932 Encounter for adequacy testing for peritoneal dialysis: Secondary | ICD-10-CM | POA: Diagnosis not present

## 2016-08-18 DIAGNOSIS — E440 Moderate protein-calorie malnutrition: Secondary | ICD-10-CM | POA: Diagnosis not present

## 2016-08-18 DIAGNOSIS — N186 End stage renal disease: Secondary | ICD-10-CM | POA: Diagnosis not present

## 2016-08-18 DIAGNOSIS — N2581 Secondary hyperparathyroidism of renal origin: Secondary | ICD-10-CM | POA: Diagnosis not present

## 2016-08-18 DIAGNOSIS — Z23 Encounter for immunization: Secondary | ICD-10-CM | POA: Diagnosis not present

## 2016-08-19 DIAGNOSIS — Z23 Encounter for immunization: Secondary | ICD-10-CM | POA: Diagnosis not present

## 2016-08-19 DIAGNOSIS — E440 Moderate protein-calorie malnutrition: Secondary | ICD-10-CM | POA: Diagnosis not present

## 2016-08-19 DIAGNOSIS — Z992 Dependence on renal dialysis: Secondary | ICD-10-CM | POA: Diagnosis not present

## 2016-08-19 DIAGNOSIS — N186 End stage renal disease: Secondary | ICD-10-CM | POA: Diagnosis not present

## 2016-08-19 DIAGNOSIS — R8299 Other abnormal findings in urine: Secondary | ICD-10-CM | POA: Diagnosis not present

## 2016-08-19 DIAGNOSIS — N2581 Secondary hyperparathyroidism of renal origin: Secondary | ICD-10-CM | POA: Diagnosis not present

## 2016-08-19 DIAGNOSIS — R17 Unspecified jaundice: Secondary | ICD-10-CM | POA: Diagnosis not present

## 2016-08-19 DIAGNOSIS — Z4932 Encounter for adequacy testing for peritoneal dialysis: Secondary | ICD-10-CM | POA: Diagnosis not present

## 2016-08-19 DIAGNOSIS — D509 Iron deficiency anemia, unspecified: Secondary | ICD-10-CM | POA: Diagnosis not present

## 2016-08-19 DIAGNOSIS — E1122 Type 2 diabetes mellitus with diabetic chronic kidney disease: Secondary | ICD-10-CM | POA: Diagnosis not present

## 2016-08-19 DIAGNOSIS — Z79899 Other long term (current) drug therapy: Secondary | ICD-10-CM | POA: Diagnosis not present

## 2016-08-20 DIAGNOSIS — Z23 Encounter for immunization: Secondary | ICD-10-CM | POA: Diagnosis not present

## 2016-08-20 DIAGNOSIS — N2581 Secondary hyperparathyroidism of renal origin: Secondary | ICD-10-CM | POA: Diagnosis not present

## 2016-08-20 DIAGNOSIS — N186 End stage renal disease: Secondary | ICD-10-CM | POA: Diagnosis not present

## 2016-08-20 DIAGNOSIS — E119 Type 2 diabetes mellitus without complications: Secondary | ICD-10-CM | POA: Diagnosis not present

## 2016-08-20 DIAGNOSIS — E784 Other hyperlipidemia: Secondary | ICD-10-CM | POA: Diagnosis not present

## 2016-08-20 DIAGNOSIS — D631 Anemia in chronic kidney disease: Secondary | ICD-10-CM | POA: Diagnosis not present

## 2016-08-20 DIAGNOSIS — N2589 Other disorders resulting from impaired renal tubular function: Secondary | ICD-10-CM | POA: Diagnosis not present

## 2016-08-20 DIAGNOSIS — D509 Iron deficiency anemia, unspecified: Secondary | ICD-10-CM | POA: Diagnosis not present

## 2016-08-20 DIAGNOSIS — R8299 Other abnormal findings in urine: Secondary | ICD-10-CM | POA: Diagnosis not present

## 2016-08-21 ENCOUNTER — Ambulatory Visit: Payer: Self-pay | Admitting: Physician Assistant

## 2016-08-21 DIAGNOSIS — D631 Anemia in chronic kidney disease: Secondary | ICD-10-CM | POA: Diagnosis not present

## 2016-08-21 DIAGNOSIS — E119 Type 2 diabetes mellitus without complications: Secondary | ICD-10-CM | POA: Diagnosis not present

## 2016-08-21 DIAGNOSIS — N2581 Secondary hyperparathyroidism of renal origin: Secondary | ICD-10-CM | POA: Diagnosis not present

## 2016-08-21 DIAGNOSIS — N186 End stage renal disease: Secondary | ICD-10-CM | POA: Diagnosis not present

## 2016-08-21 DIAGNOSIS — N2589 Other disorders resulting from impaired renal tubular function: Secondary | ICD-10-CM | POA: Diagnosis not present

## 2016-08-21 DIAGNOSIS — E784 Other hyperlipidemia: Secondary | ICD-10-CM | POA: Diagnosis not present

## 2016-08-21 DIAGNOSIS — Z23 Encounter for immunization: Secondary | ICD-10-CM | POA: Diagnosis not present

## 2016-08-21 DIAGNOSIS — R8299 Other abnormal findings in urine: Secondary | ICD-10-CM | POA: Diagnosis not present

## 2016-08-21 DIAGNOSIS — D509 Iron deficiency anemia, unspecified: Secondary | ICD-10-CM | POA: Diagnosis not present

## 2016-08-22 DIAGNOSIS — R8299 Other abnormal findings in urine: Secondary | ICD-10-CM | POA: Diagnosis not present

## 2016-08-22 DIAGNOSIS — N2581 Secondary hyperparathyroidism of renal origin: Secondary | ICD-10-CM | POA: Diagnosis not present

## 2016-08-22 DIAGNOSIS — Z23 Encounter for immunization: Secondary | ICD-10-CM | POA: Diagnosis not present

## 2016-08-22 DIAGNOSIS — D509 Iron deficiency anemia, unspecified: Secondary | ICD-10-CM | POA: Diagnosis not present

## 2016-08-22 DIAGNOSIS — E784 Other hyperlipidemia: Secondary | ICD-10-CM | POA: Diagnosis not present

## 2016-08-22 DIAGNOSIS — E119 Type 2 diabetes mellitus without complications: Secondary | ICD-10-CM | POA: Diagnosis not present

## 2016-08-22 DIAGNOSIS — D631 Anemia in chronic kidney disease: Secondary | ICD-10-CM | POA: Diagnosis not present

## 2016-08-22 DIAGNOSIS — N186 End stage renal disease: Secondary | ICD-10-CM | POA: Diagnosis not present

## 2016-08-22 DIAGNOSIS — N2589 Other disorders resulting from impaired renal tubular function: Secondary | ICD-10-CM | POA: Diagnosis not present

## 2016-08-23 DIAGNOSIS — E119 Type 2 diabetes mellitus without complications: Secondary | ICD-10-CM | POA: Diagnosis not present

## 2016-08-23 DIAGNOSIS — N2589 Other disorders resulting from impaired renal tubular function: Secondary | ICD-10-CM | POA: Diagnosis not present

## 2016-08-23 DIAGNOSIS — D631 Anemia in chronic kidney disease: Secondary | ICD-10-CM | POA: Diagnosis not present

## 2016-08-23 DIAGNOSIS — R8299 Other abnormal findings in urine: Secondary | ICD-10-CM | POA: Diagnosis not present

## 2016-08-23 DIAGNOSIS — Z23 Encounter for immunization: Secondary | ICD-10-CM | POA: Diagnosis not present

## 2016-08-23 DIAGNOSIS — N2581 Secondary hyperparathyroidism of renal origin: Secondary | ICD-10-CM | POA: Diagnosis not present

## 2016-08-23 DIAGNOSIS — E784 Other hyperlipidemia: Secondary | ICD-10-CM | POA: Diagnosis not present

## 2016-08-23 DIAGNOSIS — N186 End stage renal disease: Secondary | ICD-10-CM | POA: Diagnosis not present

## 2016-08-23 DIAGNOSIS — D509 Iron deficiency anemia, unspecified: Secondary | ICD-10-CM | POA: Diagnosis not present

## 2016-08-24 DIAGNOSIS — E119 Type 2 diabetes mellitus without complications: Secondary | ICD-10-CM | POA: Diagnosis not present

## 2016-08-24 DIAGNOSIS — N2589 Other disorders resulting from impaired renal tubular function: Secondary | ICD-10-CM | POA: Diagnosis not present

## 2016-08-24 DIAGNOSIS — Z23 Encounter for immunization: Secondary | ICD-10-CM | POA: Diagnosis not present

## 2016-08-24 DIAGNOSIS — N186 End stage renal disease: Secondary | ICD-10-CM | POA: Diagnosis not present

## 2016-08-24 DIAGNOSIS — D631 Anemia in chronic kidney disease: Secondary | ICD-10-CM | POA: Diagnosis not present

## 2016-08-24 DIAGNOSIS — D509 Iron deficiency anemia, unspecified: Secondary | ICD-10-CM | POA: Diagnosis not present

## 2016-08-24 DIAGNOSIS — E784 Other hyperlipidemia: Secondary | ICD-10-CM | POA: Diagnosis not present

## 2016-08-24 DIAGNOSIS — R8299 Other abnormal findings in urine: Secondary | ICD-10-CM | POA: Diagnosis not present

## 2016-08-24 DIAGNOSIS — N2581 Secondary hyperparathyroidism of renal origin: Secondary | ICD-10-CM | POA: Diagnosis not present

## 2016-08-25 ENCOUNTER — Ambulatory Visit (INDEPENDENT_AMBULATORY_CARE_PROVIDER_SITE_OTHER): Payer: Medicare Other | Admitting: Physician Assistant

## 2016-08-25 ENCOUNTER — Encounter: Payer: Self-pay | Admitting: Physician Assistant

## 2016-08-25 VITALS — BP 120/70 | HR 71 | Temp 97.7°F | Resp 14 | Ht 61.0 in | Wt 147.2 lb

## 2016-08-25 DIAGNOSIS — R8299 Other abnormal findings in urine: Secondary | ICD-10-CM | POA: Diagnosis not present

## 2016-08-25 DIAGNOSIS — Z992 Dependence on renal dialysis: Secondary | ICD-10-CM | POA: Diagnosis not present

## 2016-08-25 DIAGNOSIS — D631 Anemia in chronic kidney disease: Secondary | ICD-10-CM | POA: Diagnosis not present

## 2016-08-25 DIAGNOSIS — N2581 Secondary hyperparathyroidism of renal origin: Secondary | ICD-10-CM | POA: Diagnosis not present

## 2016-08-25 DIAGNOSIS — N186 End stage renal disease: Secondary | ICD-10-CM

## 2016-08-25 DIAGNOSIS — N2589 Other disorders resulting from impaired renal tubular function: Secondary | ICD-10-CM | POA: Diagnosis not present

## 2016-08-25 DIAGNOSIS — E559 Vitamin D deficiency, unspecified: Secondary | ICD-10-CM

## 2016-08-25 DIAGNOSIS — E784 Other hyperlipidemia: Secondary | ICD-10-CM | POA: Diagnosis not present

## 2016-08-25 DIAGNOSIS — E782 Mixed hyperlipidemia: Secondary | ICD-10-CM | POA: Diagnosis not present

## 2016-08-25 DIAGNOSIS — Z23 Encounter for immunization: Secondary | ICD-10-CM | POA: Diagnosis not present

## 2016-08-25 DIAGNOSIS — E1122 Type 2 diabetes mellitus with diabetic chronic kidney disease: Secondary | ICD-10-CM

## 2016-08-25 DIAGNOSIS — Z79899 Other long term (current) drug therapy: Secondary | ICD-10-CM | POA: Diagnosis not present

## 2016-08-25 DIAGNOSIS — D509 Iron deficiency anemia, unspecified: Secondary | ICD-10-CM | POA: Diagnosis not present

## 2016-08-25 DIAGNOSIS — E119 Type 2 diabetes mellitus without complications: Secondary | ICD-10-CM | POA: Diagnosis not present

## 2016-08-25 DIAGNOSIS — I10 Essential (primary) hypertension: Secondary | ICD-10-CM | POA: Diagnosis not present

## 2016-08-25 LAB — BASIC METABOLIC PANEL WITH GFR
BUN: 87 mg/dL — ABNORMAL HIGH (ref 7–25)
CHLORIDE: 99 mmol/L (ref 98–110)
CO2: 26 mmol/L (ref 20–31)
CREATININE: 8.22 mg/dL — AB (ref 0.60–0.93)
Calcium: 9.4 mg/dL (ref 8.6–10.4)
GFR, EST NON AFRICAN AMERICAN: 4 mL/min — AB (ref >=60)
GFR, Est African American: 5 mL/min — ABNORMAL LOW (ref >=60)
Glucose, Bld: 130 mg/dL — ABNORMAL HIGH (ref 65–99)
POTASSIUM: 3.9 mmol/L (ref 3.5–5.3)
SODIUM: 140 mmol/L (ref 135–146)

## 2016-08-25 LAB — CBC WITH DIFFERENTIAL/PLATELET
Basophils Absolute: 0 cells/uL (ref 0–200)
Basophils Relative: 0 %
EOS PCT: 4 %
Eosinophils Absolute: 300 cells/uL (ref 15–500)
HCT: 43.3 % (ref 35.0–45.0)
HEMOGLOBIN: 13.8 g/dL (ref 11.7–15.5)
LYMPHS ABS: 1950 {cells}/uL (ref 850–3900)
Lymphocytes Relative: 26 %
MCH: 30.5 pg (ref 27.0–33.0)
MCHC: 31.9 g/dL — AB (ref 32.0–36.0)
MCV: 95.6 fL (ref 80.0–100.0)
MPV: 10.4 fL (ref 7.5–12.5)
Monocytes Absolute: 375 cells/uL (ref 200–950)
Monocytes Relative: 5 %
NEUTROS PCT: 65 %
Neutro Abs: 4875 cells/uL (ref 1500–7800)
Platelets: 191 10*3/uL (ref 140–400)
RBC: 4.53 MIL/uL (ref 3.80–5.10)
RDW: 15.1 % — AB (ref 11.0–15.0)
WBC: 7.5 10*3/uL (ref 3.8–10.8)

## 2016-08-25 LAB — LIPID PANEL
CHOL/HDL RATIO: 3.1 ratio (ref <=5.0)
Cholesterol: 195 mg/dL (ref 125–200)
HDL: 62 mg/dL (ref >=46)
LDL Cholesterol: 89 mg/dL (ref <130)
Triglycerides: 221 mg/dL — ABNORMAL HIGH (ref <150)
VLDL: 44 mg/dL — AB (ref <30)

## 2016-08-25 LAB — HEPATIC FUNCTION PANEL
ALT: 28 U/L (ref 6–29)
AST: 22 U/L (ref 10–35)
Albumin: 3.3 g/dL — ABNORMAL LOW (ref 3.6–5.1)
Alkaline Phosphatase: 167 U/L — ABNORMAL HIGH (ref 33–130)
BILIRUBIN DIRECT: 0.1 mg/dL (ref <=0.2)
BILIRUBIN INDIRECT: 0.2 mg/dL (ref 0.2–1.2)
BILIRUBIN TOTAL: 0.3 mg/dL (ref 0.2–1.2)
Total Protein: 6 g/dL — ABNORMAL LOW (ref 6.1–8.1)

## 2016-08-25 LAB — TSH: TSH: 1.7 m[IU]/L

## 2016-08-25 LAB — HEMOGLOBIN A1C
HEMOGLOBIN A1C: 5.6 % (ref <5.7)
Mean Plasma Glucose: 114 mg/dL

## 2016-08-25 NOTE — Progress Notes (Addendum)
3 MONTH FOLLOW UP  Assessment:    Essential hypertension -cont current meds -monitor at home -DASH diet -exercise  ESRD (end stage renal disease) ([place]) -on dialysis at home  Hyperlipidemia -cont diet and exercise  Essential hypertension -     CBC with Differential/Platelet -     BASIC METABOLIC PANEL WITH GFR -     Hepatic function panel -     TSH  Type 2 diabetes mellitus with chronic kidney disease on chronic dialysis, without long-term current use of insulin (HCC) -     Hemoglobin A1c    Over 30 minutes of exam, counseling, chart review, and critical decision making was performed    Subjective:   Eileen Perez is a 75 y.o. female who presents for 3 month follow up on hypertension, prediabetes, hyperlipidemia, vitamin D def.    Her blood pressure has been controlled at home, today their BP is BP: 120/70 She does not workout. She denies chest pain, shortness of breath, dizziness.  With dialysis starting this past March/Feb, had it yesterday, doing well, no signs of fluid overload, she is on peritoneal dialysis.  Lab Results  Component Value Date   GFRAA 6 (L) 05/19/2016   She is not on cholesterol medication and denies myalgias. Her cholesterol is at goal. The cholesterol last visit was:   Lab Results  Component Value Date   CHOL 217 (H) 05/19/2016   HDL 87 05/19/2016   LDLCALC 100 05/19/2016   TRIG 148 05/19/2016   CHOLHDL 2.5 05/19/2016   She has been working on diet and exercise for prediabetes but last visit she went into the Dm range, she does not drink any sugars, admits to increased cake eating/bread, and denies foot ulcerations, hyperglycemia, hypoglycemia , increased appetite, nausea, paresthesia of the feet, polydipsia, polyuria, visual disturbances, vomiting and weight loss. Last A1C in the office was:  Lab Results  Component Value Date   HGBA1C 7.2 (H) 05/19/2016  Patient is on Vitamin D supplement. Lab Results  Component Value Date   VD25OH 32  05/19/2016      Patient is on allopurinol for gout and does not report a recent flare.  Lab Results  Component Value Date   LABURIC 3.2 05/19/2016    Medication Review Current Outpatient Prescriptions on File Prior to Visit  Medication Sig Dispense Refill  . allopurinol (ZYLOPRIM) 300 MG tablet Take 1/2 to 1 tablet by  mouth daily as directed 90 tablet 1  . aspirin 81 MG tablet Take 81 mg by mouth daily.    Marland Kitchen b complex-vitamin c-folic acid (NEPHRO-VITE) 0.8 MG TABS tablet Take 1 tablet by mouth at bedtime.    Marland Kitchen CALCIUM PO Take by mouth.    . Cholecalciferol (VITAMIN D-3 PO) Take 1 tablet by mouth daily.    . cinacalcet (SENSIPAR) 30 MG tablet Take 30 mg by mouth daily.    Marland Kitchen docusate sodium (COLACE) 100 MG capsule Take 100 mg by mouth 2 (two) times daily.    . Ferrous Sulfate (SLOW FE PO) Take 45 mg by mouth 2 (two) times daily.    . Flaxseed, Linseed, (FLAXSEED OIL PO) Take 1 tablet by mouth daily.    Marland Kitchen glucose blood (ACCU-CHEK AVIVA PLUS) test strip Test once daily 100 each 6  . hydrALAZINE (APRESOLINE) 10 MG tablet TAKE 2 TABLETS BY MOUTH 2  TIMES DAILY. 360 tablet 0  . labetalol (NORMODYNE) 200 MG tablet Take 1 tablet (200 mg total) by mouth 2 (two) times daily. [place]  tablet 1  . Multiple Vitamin (MULTIVITAMIN WITH MINERALS) TABS tablet Take 1 tablet by mouth daily.    . Omega-3 Fatty Acids (FISH OIL) 1200 MG CPDR Takes 1 capsule 2 x /daily    . polyethylene glycol (MIRALAX / GLYCOLAX) packet Take 17 g by mouth daily. 100 each 3  . Red Yeast Rice Extract (RED YEAST RICE PO) Take 1 tablet by mouth daily.    . sevelamer carbonate (RENVELA) 800 MG tablet Take 800 mg by mouth 3 (three) times daily with meals.    . zinc gluconate 50 MG tablet Take 50 mg by mouth every morning.    . [DISCONTINUED] diltiazem (CARDIZEM) 120 MG tablet Take 120 mg by mouth 2 (two) times daily.     No current facility-administered medications on file prior to visit.     Current Problems (verified) Patient  Active Problem List   Diagnosis Date Noted  . T2_NIDDM w/ESRD ([place]) 05/19/2016  . Medicare annual wellness exam 10/31/2015  . BMI 25.0-25.9,adult 10/31/2015  . ESRD (end stage renal disease) ([place]) 10/31/2015  . OA (osteoarthritis) of hip 05/26/2015  . Secondary hyperparathyroidism ([facility]) 04/16/2014  . Vitamin D deficiency 04/16/2014  . Hyperlipidemia 04/16/2014  . Medication management 04/16/2014  . Anemia of chronic Renal Dz   . Pancreatic cyst   . GERD   . Gout   . Osteopenia   . Hypertension 06/10/2013  . DJD 06/09/2013   Review of Systems  Constitutional: Negative.   HENT: Negative.   Eyes: Negative.   Respiratory: Negative.   Cardiovascular: Negative.   Gastrointestinal: Negative.   Genitourinary: Negative.   Musculoskeletal: Negative.   Skin: Negative.      Objective:   Today's Vitals   08/25/16 1044  BP: 120/70  Pulse: 71  Resp: 14  Temp: 97.7 F (36.5 C)  SpO2: 97%  Weight: 147 lb 3.2 oz (66.8 kg)  Height: 5\' 1"  (1.549 m)   Body mass index is 27.81 kg/m.  General appearance: alert, no distress, WD/WN,  female HEENT: normocephalic, sclerae anicteric, TMs pearly, nares patent, no discharge or erythema, pharynx normal Oral cavity: MMM, no lesions Neck: supple, no lymphadenopathy, no thyromegaly, no masses Heart: RRR, normal S1, S2, no murmurs Lungs: CTA bilaterally, no wheezes, rhonchi, or rales Abdomen: +bs, soft, non tender, non distended, no masses, no hepatomegaly, no splenomegaly Musculoskeletal: nontender, no swelling, no obvious deformity Extremities: no edema, no cyanosis, no clubbing Pulses: 2+ symmetric, upper and lower extremities, normal cap refill Neurological: alert, oriented x 3, CN2-12 intact, strength normal upper extremities and lower extremities, sensation normal throughout, DTRs 2+ throughout, no cerebellar signs, gait normal Psychiatric: normal affect, behavior normal, pleasant     Eileen Mutters, PA-C   08/25/2016

## 2016-08-25 NOTE — Patient Instructions (Signed)
Your A1C is a measure of your sugar over the past 3 months and is not affected by what you have eaten over the past few days. Diabetes increases your chances of stroke and heart attack over 300 % and is the leading cause of blindness and kidney failure in the [place]. Please make sure you decrease bad carbs like white bread, white rice, potatoes, corn, soft drinks, pasta, cereals, refined sugars, sweet tea, dried fruits, and fruit juice. Good carbs are okay to eat in moderation like sweet potatoes, brown rice, whole grain pasta/bread, most fruit (except dried fruit) and you can eat as many veggies as you want.   Greater than 6.5 is considered diabetic. Between 6.4 and 5.7 is prediabetic If your A1C is less than 5.7 you are NOT diabetic.  Your A1C was 7.2 last visit  Targets for Glucose Readings: Time of Check Target for patients WITHOUT Diabetes Target for DIABETICS  Before Meals Less than 100  less than 150  Two hours after meals Less than 200  Less than 250      Bad carbs also include fruit juice, alcohol, and sweet tea. These are empty calories that do not signal to your brain that you are full.   Please remember the good carbs are still carbs which convert into sugar. So please measure them out no more than 1/2-1 cup of rice, oatmeal, pasta, and beans  Veggies are however free foods! Pile them on.   Not all fruit is created equal. Please see the list below, the fruit at the bottom is higher in sugars than the fruit at the top. Please avoid all dried fruits.

## 2016-08-26 ENCOUNTER — Other Ambulatory Visit: Payer: Self-pay | Admitting: Internal Medicine

## 2016-08-26 DIAGNOSIS — N186 End stage renal disease: Secondary | ICD-10-CM | POA: Diagnosis not present

## 2016-08-26 DIAGNOSIS — E119 Type 2 diabetes mellitus without complications: Secondary | ICD-10-CM | POA: Diagnosis not present

## 2016-08-26 DIAGNOSIS — N2581 Secondary hyperparathyroidism of renal origin: Secondary | ICD-10-CM | POA: Diagnosis not present

## 2016-08-26 DIAGNOSIS — D509 Iron deficiency anemia, unspecified: Secondary | ICD-10-CM | POA: Diagnosis not present

## 2016-08-26 DIAGNOSIS — D631 Anemia in chronic kidney disease: Secondary | ICD-10-CM | POA: Diagnosis not present

## 2016-08-26 DIAGNOSIS — N2589 Other disorders resulting from impaired renal tubular function: Secondary | ICD-10-CM | POA: Diagnosis not present

## 2016-08-26 DIAGNOSIS — E784 Other hyperlipidemia: Secondary | ICD-10-CM | POA: Diagnosis not present

## 2016-08-26 DIAGNOSIS — R8299 Other abnormal findings in urine: Secondary | ICD-10-CM | POA: Diagnosis not present

## 2016-08-26 DIAGNOSIS — Z23 Encounter for immunization: Secondary | ICD-10-CM | POA: Diagnosis not present

## 2016-08-27 DIAGNOSIS — N2581 Secondary hyperparathyroidism of renal origin: Secondary | ICD-10-CM | POA: Diagnosis not present

## 2016-08-27 DIAGNOSIS — D509 Iron deficiency anemia, unspecified: Secondary | ICD-10-CM | POA: Diagnosis not present

## 2016-08-27 DIAGNOSIS — D631 Anemia in chronic kidney disease: Secondary | ICD-10-CM | POA: Diagnosis not present

## 2016-08-27 DIAGNOSIS — Z23 Encounter for immunization: Secondary | ICD-10-CM | POA: Diagnosis not present

## 2016-08-27 DIAGNOSIS — E119 Type 2 diabetes mellitus without complications: Secondary | ICD-10-CM | POA: Diagnosis not present

## 2016-08-27 DIAGNOSIS — N186 End stage renal disease: Secondary | ICD-10-CM | POA: Diagnosis not present

## 2016-08-27 DIAGNOSIS — R8299 Other abnormal findings in urine: Secondary | ICD-10-CM | POA: Diagnosis not present

## 2016-08-27 DIAGNOSIS — N2589 Other disorders resulting from impaired renal tubular function: Secondary | ICD-10-CM | POA: Diagnosis not present

## 2016-08-27 DIAGNOSIS — E784 Other hyperlipidemia: Secondary | ICD-10-CM | POA: Diagnosis not present

## 2016-08-28 DIAGNOSIS — D631 Anemia in chronic kidney disease: Secondary | ICD-10-CM | POA: Diagnosis not present

## 2016-08-28 DIAGNOSIS — R8299 Other abnormal findings in urine: Secondary | ICD-10-CM | POA: Diagnosis not present

## 2016-08-28 DIAGNOSIS — D509 Iron deficiency anemia, unspecified: Secondary | ICD-10-CM | POA: Diagnosis not present

## 2016-08-28 DIAGNOSIS — N186 End stage renal disease: Secondary | ICD-10-CM | POA: Diagnosis not present

## 2016-08-28 DIAGNOSIS — N2589 Other disorders resulting from impaired renal tubular function: Secondary | ICD-10-CM | POA: Diagnosis not present

## 2016-08-28 DIAGNOSIS — E119 Type 2 diabetes mellitus without complications: Secondary | ICD-10-CM | POA: Diagnosis not present

## 2016-08-28 DIAGNOSIS — E784 Other hyperlipidemia: Secondary | ICD-10-CM | POA: Diagnosis not present

## 2016-08-28 DIAGNOSIS — Z23 Encounter for immunization: Secondary | ICD-10-CM | POA: Diagnosis not present

## 2016-08-28 DIAGNOSIS — N2581 Secondary hyperparathyroidism of renal origin: Secondary | ICD-10-CM | POA: Diagnosis not present

## 2016-08-29 DIAGNOSIS — N2581 Secondary hyperparathyroidism of renal origin: Secondary | ICD-10-CM | POA: Diagnosis not present

## 2016-08-29 DIAGNOSIS — D509 Iron deficiency anemia, unspecified: Secondary | ICD-10-CM | POA: Diagnosis not present

## 2016-08-29 DIAGNOSIS — E119 Type 2 diabetes mellitus without complications: Secondary | ICD-10-CM | POA: Diagnosis not present

## 2016-08-29 DIAGNOSIS — N2589 Other disorders resulting from impaired renal tubular function: Secondary | ICD-10-CM | POA: Diagnosis not present

## 2016-08-29 DIAGNOSIS — N186 End stage renal disease: Secondary | ICD-10-CM | POA: Diagnosis not present

## 2016-08-29 DIAGNOSIS — R8299 Other abnormal findings in urine: Secondary | ICD-10-CM | POA: Diagnosis not present

## 2016-08-29 DIAGNOSIS — D631 Anemia in chronic kidney disease: Secondary | ICD-10-CM | POA: Diagnosis not present

## 2016-08-29 DIAGNOSIS — E784 Other hyperlipidemia: Secondary | ICD-10-CM | POA: Diagnosis not present

## 2016-08-29 DIAGNOSIS — Z23 Encounter for immunization: Secondary | ICD-10-CM | POA: Diagnosis not present

## 2016-08-30 DIAGNOSIS — N186 End stage renal disease: Secondary | ICD-10-CM | POA: Diagnosis not present

## 2016-08-30 DIAGNOSIS — R8299 Other abnormal findings in urine: Secondary | ICD-10-CM | POA: Diagnosis not present

## 2016-08-30 DIAGNOSIS — N2581 Secondary hyperparathyroidism of renal origin: Secondary | ICD-10-CM | POA: Diagnosis not present

## 2016-08-30 DIAGNOSIS — D631 Anemia in chronic kidney disease: Secondary | ICD-10-CM | POA: Diagnosis not present

## 2016-08-30 DIAGNOSIS — D509 Iron deficiency anemia, unspecified: Secondary | ICD-10-CM | POA: Diagnosis not present

## 2016-08-30 DIAGNOSIS — N2589 Other disorders resulting from impaired renal tubular function: Secondary | ICD-10-CM | POA: Diagnosis not present

## 2016-08-30 DIAGNOSIS — E784 Other hyperlipidemia: Secondary | ICD-10-CM | POA: Diagnosis not present

## 2016-08-30 DIAGNOSIS — Z23 Encounter for immunization: Secondary | ICD-10-CM | POA: Diagnosis not present

## 2016-08-30 DIAGNOSIS — E119 Type 2 diabetes mellitus without complications: Secondary | ICD-10-CM | POA: Diagnosis not present

## 2016-08-31 DIAGNOSIS — R8299 Other abnormal findings in urine: Secondary | ICD-10-CM | POA: Diagnosis not present

## 2016-08-31 DIAGNOSIS — E119 Type 2 diabetes mellitus without complications: Secondary | ICD-10-CM | POA: Diagnosis not present

## 2016-08-31 DIAGNOSIS — E784 Other hyperlipidemia: Secondary | ICD-10-CM | POA: Diagnosis not present

## 2016-08-31 DIAGNOSIS — D509 Iron deficiency anemia, unspecified: Secondary | ICD-10-CM | POA: Diagnosis not present

## 2016-08-31 DIAGNOSIS — Z23 Encounter for immunization: Secondary | ICD-10-CM | POA: Diagnosis not present

## 2016-08-31 DIAGNOSIS — D631 Anemia in chronic kidney disease: Secondary | ICD-10-CM | POA: Diagnosis not present

## 2016-08-31 DIAGNOSIS — N2589 Other disorders resulting from impaired renal tubular function: Secondary | ICD-10-CM | POA: Diagnosis not present

## 2016-08-31 DIAGNOSIS — N2581 Secondary hyperparathyroidism of renal origin: Secondary | ICD-10-CM | POA: Diagnosis not present

## 2016-08-31 DIAGNOSIS — N186 End stage renal disease: Secondary | ICD-10-CM | POA: Diagnosis not present

## 2016-09-01 DIAGNOSIS — Z23 Encounter for immunization: Secondary | ICD-10-CM | POA: Diagnosis not present

## 2016-09-01 DIAGNOSIS — E119 Type 2 diabetes mellitus without complications: Secondary | ICD-10-CM | POA: Diagnosis not present

## 2016-09-01 DIAGNOSIS — N186 End stage renal disease: Secondary | ICD-10-CM | POA: Diagnosis not present

## 2016-09-01 DIAGNOSIS — E784 Other hyperlipidemia: Secondary | ICD-10-CM | POA: Diagnosis not present

## 2016-09-01 DIAGNOSIS — D509 Iron deficiency anemia, unspecified: Secondary | ICD-10-CM | POA: Diagnosis not present

## 2016-09-01 DIAGNOSIS — N2581 Secondary hyperparathyroidism of renal origin: Secondary | ICD-10-CM | POA: Diagnosis not present

## 2016-09-01 DIAGNOSIS — D631 Anemia in chronic kidney disease: Secondary | ICD-10-CM | POA: Diagnosis not present

## 2016-09-01 DIAGNOSIS — N2589 Other disorders resulting from impaired renal tubular function: Secondary | ICD-10-CM | POA: Diagnosis not present

## 2016-09-01 DIAGNOSIS — R8299 Other abnormal findings in urine: Secondary | ICD-10-CM | POA: Diagnosis not present

## 2016-09-02 DIAGNOSIS — D509 Iron deficiency anemia, unspecified: Secondary | ICD-10-CM | POA: Diagnosis not present

## 2016-09-02 DIAGNOSIS — N2581 Secondary hyperparathyroidism of renal origin: Secondary | ICD-10-CM | POA: Diagnosis not present

## 2016-09-02 DIAGNOSIS — E784 Other hyperlipidemia: Secondary | ICD-10-CM | POA: Diagnosis not present

## 2016-09-02 DIAGNOSIS — Z23 Encounter for immunization: Secondary | ICD-10-CM | POA: Diagnosis not present

## 2016-09-02 DIAGNOSIS — R8299 Other abnormal findings in urine: Secondary | ICD-10-CM | POA: Diagnosis not present

## 2016-09-02 DIAGNOSIS — E119 Type 2 diabetes mellitus without complications: Secondary | ICD-10-CM | POA: Diagnosis not present

## 2016-09-02 DIAGNOSIS — D631 Anemia in chronic kidney disease: Secondary | ICD-10-CM | POA: Diagnosis not present

## 2016-09-02 DIAGNOSIS — N186 End stage renal disease: Secondary | ICD-10-CM | POA: Diagnosis not present

## 2016-09-02 DIAGNOSIS — N2589 Other disorders resulting from impaired renal tubular function: Secondary | ICD-10-CM | POA: Diagnosis not present

## 2016-09-03 DIAGNOSIS — N2589 Other disorders resulting from impaired renal tubular function: Secondary | ICD-10-CM | POA: Diagnosis not present

## 2016-09-03 DIAGNOSIS — Z23 Encounter for immunization: Secondary | ICD-10-CM | POA: Diagnosis not present

## 2016-09-03 DIAGNOSIS — N186 End stage renal disease: Secondary | ICD-10-CM | POA: Diagnosis not present

## 2016-09-03 DIAGNOSIS — D631 Anemia in chronic kidney disease: Secondary | ICD-10-CM | POA: Diagnosis not present

## 2016-09-03 DIAGNOSIS — N2581 Secondary hyperparathyroidism of renal origin: Secondary | ICD-10-CM | POA: Diagnosis not present

## 2016-09-03 DIAGNOSIS — R8299 Other abnormal findings in urine: Secondary | ICD-10-CM | POA: Diagnosis not present

## 2016-09-03 DIAGNOSIS — E784 Other hyperlipidemia: Secondary | ICD-10-CM | POA: Diagnosis not present

## 2016-09-03 DIAGNOSIS — D509 Iron deficiency anemia, unspecified: Secondary | ICD-10-CM | POA: Diagnosis not present

## 2016-09-03 DIAGNOSIS — E119 Type 2 diabetes mellitus without complications: Secondary | ICD-10-CM | POA: Diagnosis not present

## 2016-09-04 DIAGNOSIS — D631 Anemia in chronic kidney disease: Secondary | ICD-10-CM | POA: Diagnosis not present

## 2016-09-04 DIAGNOSIS — N2589 Other disorders resulting from impaired renal tubular function: Secondary | ICD-10-CM | POA: Diagnosis not present

## 2016-09-04 DIAGNOSIS — R8299 Other abnormal findings in urine: Secondary | ICD-10-CM | POA: Diagnosis not present

## 2016-09-04 DIAGNOSIS — N186 End stage renal disease: Secondary | ICD-10-CM | POA: Diagnosis not present

## 2016-09-04 DIAGNOSIS — D509 Iron deficiency anemia, unspecified: Secondary | ICD-10-CM | POA: Diagnosis not present

## 2016-09-04 DIAGNOSIS — E784 Other hyperlipidemia: Secondary | ICD-10-CM | POA: Diagnosis not present

## 2016-09-04 DIAGNOSIS — E119 Type 2 diabetes mellitus without complications: Secondary | ICD-10-CM | POA: Diagnosis not present

## 2016-09-04 DIAGNOSIS — N2581 Secondary hyperparathyroidism of renal origin: Secondary | ICD-10-CM | POA: Diagnosis not present

## 2016-09-04 DIAGNOSIS — Z23 Encounter for immunization: Secondary | ICD-10-CM | POA: Diagnosis not present

## 2016-09-05 DIAGNOSIS — R8299 Other abnormal findings in urine: Secondary | ICD-10-CM | POA: Diagnosis not present

## 2016-09-05 DIAGNOSIS — Z23 Encounter for immunization: Secondary | ICD-10-CM | POA: Diagnosis not present

## 2016-09-05 DIAGNOSIS — N2581 Secondary hyperparathyroidism of renal origin: Secondary | ICD-10-CM | POA: Diagnosis not present

## 2016-09-05 DIAGNOSIS — D509 Iron deficiency anemia, unspecified: Secondary | ICD-10-CM | POA: Diagnosis not present

## 2016-09-05 DIAGNOSIS — E784 Other hyperlipidemia: Secondary | ICD-10-CM | POA: Diagnosis not present

## 2016-09-05 DIAGNOSIS — D631 Anemia in chronic kidney disease: Secondary | ICD-10-CM | POA: Diagnosis not present

## 2016-09-05 DIAGNOSIS — E119 Type 2 diabetes mellitus without complications: Secondary | ICD-10-CM | POA: Diagnosis not present

## 2016-09-05 DIAGNOSIS — N186 End stage renal disease: Secondary | ICD-10-CM | POA: Diagnosis not present

## 2016-09-05 DIAGNOSIS — N2589 Other disorders resulting from impaired renal tubular function: Secondary | ICD-10-CM | POA: Diagnosis not present

## 2016-09-06 DIAGNOSIS — D631 Anemia in chronic kidney disease: Secondary | ICD-10-CM | POA: Diagnosis not present

## 2016-09-06 DIAGNOSIS — N186 End stage renal disease: Secondary | ICD-10-CM | POA: Diagnosis not present

## 2016-09-06 DIAGNOSIS — R8299 Other abnormal findings in urine: Secondary | ICD-10-CM | POA: Diagnosis not present

## 2016-09-06 DIAGNOSIS — N2581 Secondary hyperparathyroidism of renal origin: Secondary | ICD-10-CM | POA: Diagnosis not present

## 2016-09-06 DIAGNOSIS — N2589 Other disorders resulting from impaired renal tubular function: Secondary | ICD-10-CM | POA: Diagnosis not present

## 2016-09-06 DIAGNOSIS — D509 Iron deficiency anemia, unspecified: Secondary | ICD-10-CM | POA: Diagnosis not present

## 2016-09-06 DIAGNOSIS — Z23 Encounter for immunization: Secondary | ICD-10-CM | POA: Diagnosis not present

## 2016-09-06 DIAGNOSIS — E784 Other hyperlipidemia: Secondary | ICD-10-CM | POA: Diagnosis not present

## 2016-09-06 DIAGNOSIS — E119 Type 2 diabetes mellitus without complications: Secondary | ICD-10-CM | POA: Diagnosis not present

## 2016-09-07 DIAGNOSIS — N186 End stage renal disease: Secondary | ICD-10-CM | POA: Diagnosis not present

## 2016-09-07 DIAGNOSIS — N2589 Other disorders resulting from impaired renal tubular function: Secondary | ICD-10-CM | POA: Diagnosis not present

## 2016-09-07 DIAGNOSIS — N2581 Secondary hyperparathyroidism of renal origin: Secondary | ICD-10-CM | POA: Diagnosis not present

## 2016-09-07 DIAGNOSIS — E784 Other hyperlipidemia: Secondary | ICD-10-CM | POA: Diagnosis not present

## 2016-09-07 DIAGNOSIS — R8299 Other abnormal findings in urine: Secondary | ICD-10-CM | POA: Diagnosis not present

## 2016-09-07 DIAGNOSIS — D509 Iron deficiency anemia, unspecified: Secondary | ICD-10-CM | POA: Diagnosis not present

## 2016-09-07 DIAGNOSIS — D631 Anemia in chronic kidney disease: Secondary | ICD-10-CM | POA: Diagnosis not present

## 2016-09-07 DIAGNOSIS — E119 Type 2 diabetes mellitus without complications: Secondary | ICD-10-CM | POA: Diagnosis not present

## 2016-09-07 DIAGNOSIS — Z23 Encounter for immunization: Secondary | ICD-10-CM | POA: Diagnosis not present

## 2016-09-08 DIAGNOSIS — E119 Type 2 diabetes mellitus without complications: Secondary | ICD-10-CM | POA: Diagnosis not present

## 2016-09-08 DIAGNOSIS — D631 Anemia in chronic kidney disease: Secondary | ICD-10-CM | POA: Diagnosis not present

## 2016-09-08 DIAGNOSIS — Z23 Encounter for immunization: Secondary | ICD-10-CM | POA: Diagnosis not present

## 2016-09-08 DIAGNOSIS — N2581 Secondary hyperparathyroidism of renal origin: Secondary | ICD-10-CM | POA: Diagnosis not present

## 2016-09-08 DIAGNOSIS — N2589 Other disorders resulting from impaired renal tubular function: Secondary | ICD-10-CM | POA: Diagnosis not present

## 2016-09-08 DIAGNOSIS — R8299 Other abnormal findings in urine: Secondary | ICD-10-CM | POA: Diagnosis not present

## 2016-09-08 DIAGNOSIS — N186 End stage renal disease: Secondary | ICD-10-CM | POA: Diagnosis not present

## 2016-09-08 DIAGNOSIS — E784 Other hyperlipidemia: Secondary | ICD-10-CM | POA: Diagnosis not present

## 2016-09-08 DIAGNOSIS — D509 Iron deficiency anemia, unspecified: Secondary | ICD-10-CM | POA: Diagnosis not present

## 2016-09-09 DIAGNOSIS — E119 Type 2 diabetes mellitus without complications: Secondary | ICD-10-CM | POA: Diagnosis not present

## 2016-09-09 DIAGNOSIS — D631 Anemia in chronic kidney disease: Secondary | ICD-10-CM | POA: Diagnosis not present

## 2016-09-09 DIAGNOSIS — D509 Iron deficiency anemia, unspecified: Secondary | ICD-10-CM | POA: Diagnosis not present

## 2016-09-09 DIAGNOSIS — N2581 Secondary hyperparathyroidism of renal origin: Secondary | ICD-10-CM | POA: Diagnosis not present

## 2016-09-09 DIAGNOSIS — E784 Other hyperlipidemia: Secondary | ICD-10-CM | POA: Diagnosis not present

## 2016-09-09 DIAGNOSIS — Z23 Encounter for immunization: Secondary | ICD-10-CM | POA: Diagnosis not present

## 2016-09-09 DIAGNOSIS — R8299 Other abnormal findings in urine: Secondary | ICD-10-CM | POA: Diagnosis not present

## 2016-09-09 DIAGNOSIS — N2589 Other disorders resulting from impaired renal tubular function: Secondary | ICD-10-CM | POA: Diagnosis not present

## 2016-09-09 DIAGNOSIS — N186 End stage renal disease: Secondary | ICD-10-CM | POA: Diagnosis not present

## 2016-09-10 DIAGNOSIS — D509 Iron deficiency anemia, unspecified: Secondary | ICD-10-CM | POA: Diagnosis not present

## 2016-09-10 DIAGNOSIS — Z23 Encounter for immunization: Secondary | ICD-10-CM | POA: Diagnosis not present

## 2016-09-10 DIAGNOSIS — E784 Other hyperlipidemia: Secondary | ICD-10-CM | POA: Diagnosis not present

## 2016-09-10 DIAGNOSIS — R8299 Other abnormal findings in urine: Secondary | ICD-10-CM | POA: Diagnosis not present

## 2016-09-10 DIAGNOSIS — E119 Type 2 diabetes mellitus without complications: Secondary | ICD-10-CM | POA: Diagnosis not present

## 2016-09-10 DIAGNOSIS — N186 End stage renal disease: Secondary | ICD-10-CM | POA: Diagnosis not present

## 2016-09-10 DIAGNOSIS — N2581 Secondary hyperparathyroidism of renal origin: Secondary | ICD-10-CM | POA: Diagnosis not present

## 2016-09-10 DIAGNOSIS — N2589 Other disorders resulting from impaired renal tubular function: Secondary | ICD-10-CM | POA: Diagnosis not present

## 2016-09-10 DIAGNOSIS — D631 Anemia in chronic kidney disease: Secondary | ICD-10-CM | POA: Diagnosis not present

## 2016-09-11 DIAGNOSIS — D509 Iron deficiency anemia, unspecified: Secondary | ICD-10-CM | POA: Diagnosis not present

## 2016-09-11 DIAGNOSIS — N2589 Other disorders resulting from impaired renal tubular function: Secondary | ICD-10-CM | POA: Diagnosis not present

## 2016-09-11 DIAGNOSIS — R8299 Other abnormal findings in urine: Secondary | ICD-10-CM | POA: Diagnosis not present

## 2016-09-11 DIAGNOSIS — E784 Other hyperlipidemia: Secondary | ICD-10-CM | POA: Diagnosis not present

## 2016-09-11 DIAGNOSIS — N2581 Secondary hyperparathyroidism of renal origin: Secondary | ICD-10-CM | POA: Diagnosis not present

## 2016-09-11 DIAGNOSIS — Z23 Encounter for immunization: Secondary | ICD-10-CM | POA: Diagnosis not present

## 2016-09-11 DIAGNOSIS — E119 Type 2 diabetes mellitus without complications: Secondary | ICD-10-CM | POA: Diagnosis not present

## 2016-09-11 DIAGNOSIS — D631 Anemia in chronic kidney disease: Secondary | ICD-10-CM | POA: Diagnosis not present

## 2016-09-11 DIAGNOSIS — N186 End stage renal disease: Secondary | ICD-10-CM | POA: Diagnosis not present

## 2016-09-12 DIAGNOSIS — N186 End stage renal disease: Secondary | ICD-10-CM | POA: Diagnosis not present

## 2016-09-12 DIAGNOSIS — E119 Type 2 diabetes mellitus without complications: Secondary | ICD-10-CM | POA: Diagnosis not present

## 2016-09-12 DIAGNOSIS — D631 Anemia in chronic kidney disease: Secondary | ICD-10-CM | POA: Diagnosis not present

## 2016-09-12 DIAGNOSIS — D509 Iron deficiency anemia, unspecified: Secondary | ICD-10-CM | POA: Diagnosis not present

## 2016-09-12 DIAGNOSIS — E784 Other hyperlipidemia: Secondary | ICD-10-CM | POA: Diagnosis not present

## 2016-09-12 DIAGNOSIS — N2589 Other disorders resulting from impaired renal tubular function: Secondary | ICD-10-CM | POA: Diagnosis not present

## 2016-09-12 DIAGNOSIS — R8299 Other abnormal findings in urine: Secondary | ICD-10-CM | POA: Diagnosis not present

## 2016-09-12 DIAGNOSIS — Z23 Encounter for immunization: Secondary | ICD-10-CM | POA: Diagnosis not present

## 2016-09-12 DIAGNOSIS — N2581 Secondary hyperparathyroidism of renal origin: Secondary | ICD-10-CM | POA: Diagnosis not present

## 2016-09-13 DIAGNOSIS — R8299 Other abnormal findings in urine: Secondary | ICD-10-CM | POA: Diagnosis not present

## 2016-09-13 DIAGNOSIS — D631 Anemia in chronic kidney disease: Secondary | ICD-10-CM | POA: Diagnosis not present

## 2016-09-13 DIAGNOSIS — Z23 Encounter for immunization: Secondary | ICD-10-CM | POA: Diagnosis not present

## 2016-09-13 DIAGNOSIS — D509 Iron deficiency anemia, unspecified: Secondary | ICD-10-CM | POA: Diagnosis not present

## 2016-09-13 DIAGNOSIS — N2581 Secondary hyperparathyroidism of renal origin: Secondary | ICD-10-CM | POA: Diagnosis not present

## 2016-09-13 DIAGNOSIS — N2589 Other disorders resulting from impaired renal tubular function: Secondary | ICD-10-CM | POA: Diagnosis not present

## 2016-09-13 DIAGNOSIS — E784 Other hyperlipidemia: Secondary | ICD-10-CM | POA: Diagnosis not present

## 2016-09-13 DIAGNOSIS — N186 End stage renal disease: Secondary | ICD-10-CM | POA: Diagnosis not present

## 2016-09-13 DIAGNOSIS — E119 Type 2 diabetes mellitus without complications: Secondary | ICD-10-CM | POA: Diagnosis not present

## 2016-09-14 ENCOUNTER — Other Ambulatory Visit: Payer: Self-pay | Admitting: Cardiology

## 2016-09-14 ENCOUNTER — Other Ambulatory Visit: Payer: Self-pay | Admitting: *Deleted

## 2016-09-14 DIAGNOSIS — R8299 Other abnormal findings in urine: Secondary | ICD-10-CM | POA: Diagnosis not present

## 2016-09-14 DIAGNOSIS — Z23 Encounter for immunization: Secondary | ICD-10-CM | POA: Diagnosis not present

## 2016-09-14 DIAGNOSIS — N2581 Secondary hyperparathyroidism of renal origin: Secondary | ICD-10-CM | POA: Diagnosis not present

## 2016-09-14 DIAGNOSIS — D631 Anemia in chronic kidney disease: Secondary | ICD-10-CM | POA: Diagnosis not present

## 2016-09-14 DIAGNOSIS — N2589 Other disorders resulting from impaired renal tubular function: Secondary | ICD-10-CM | POA: Diagnosis not present

## 2016-09-14 DIAGNOSIS — N186 End stage renal disease: Secondary | ICD-10-CM | POA: Diagnosis not present

## 2016-09-14 DIAGNOSIS — E119 Type 2 diabetes mellitus without complications: Secondary | ICD-10-CM | POA: Diagnosis not present

## 2016-09-14 DIAGNOSIS — D509 Iron deficiency anemia, unspecified: Secondary | ICD-10-CM | POA: Diagnosis not present

## 2016-09-14 DIAGNOSIS — E784 Other hyperlipidemia: Secondary | ICD-10-CM | POA: Diagnosis not present

## 2016-09-14 MED ORDER — HYDRALAZINE HCL 10 MG PO TABS: ORAL_TABLET | ORAL | 3 refills | Status: DC

## 2016-09-15 DIAGNOSIS — N186 End stage renal disease: Secondary | ICD-10-CM | POA: Diagnosis not present

## 2016-09-15 DIAGNOSIS — N2589 Other disorders resulting from impaired renal tubular function: Secondary | ICD-10-CM | POA: Diagnosis not present

## 2016-09-15 DIAGNOSIS — Z23 Encounter for immunization: Secondary | ICD-10-CM | POA: Diagnosis not present

## 2016-09-15 DIAGNOSIS — D509 Iron deficiency anemia, unspecified: Secondary | ICD-10-CM | POA: Diagnosis not present

## 2016-09-15 DIAGNOSIS — E119 Type 2 diabetes mellitus without complications: Secondary | ICD-10-CM | POA: Diagnosis not present

## 2016-09-15 DIAGNOSIS — N2581 Secondary hyperparathyroidism of renal origin: Secondary | ICD-10-CM | POA: Diagnosis not present

## 2016-09-15 DIAGNOSIS — E784 Other hyperlipidemia: Secondary | ICD-10-CM | POA: Diagnosis not present

## 2016-09-15 DIAGNOSIS — D631 Anemia in chronic kidney disease: Secondary | ICD-10-CM | POA: Diagnosis not present

## 2016-09-15 DIAGNOSIS — R8299 Other abnormal findings in urine: Secondary | ICD-10-CM | POA: Diagnosis not present

## 2016-09-16 DIAGNOSIS — N2581 Secondary hyperparathyroidism of renal origin: Secondary | ICD-10-CM | POA: Diagnosis not present

## 2016-09-16 DIAGNOSIS — E119 Type 2 diabetes mellitus without complications: Secondary | ICD-10-CM | POA: Diagnosis not present

## 2016-09-16 DIAGNOSIS — N186 End stage renal disease: Secondary | ICD-10-CM | POA: Diagnosis not present

## 2016-09-16 DIAGNOSIS — N2589 Other disorders resulting from impaired renal tubular function: Secondary | ICD-10-CM | POA: Diagnosis not present

## 2016-09-16 DIAGNOSIS — D631 Anemia in chronic kidney disease: Secondary | ICD-10-CM | POA: Diagnosis not present

## 2016-09-16 DIAGNOSIS — Z23 Encounter for immunization: Secondary | ICD-10-CM | POA: Diagnosis not present

## 2016-09-16 DIAGNOSIS — D509 Iron deficiency anemia, unspecified: Secondary | ICD-10-CM | POA: Diagnosis not present

## 2016-09-16 DIAGNOSIS — R8299 Other abnormal findings in urine: Secondary | ICD-10-CM | POA: Diagnosis not present

## 2016-09-16 DIAGNOSIS — E784 Other hyperlipidemia: Secondary | ICD-10-CM | POA: Diagnosis not present

## 2016-09-17 DIAGNOSIS — Z23 Encounter for immunization: Secondary | ICD-10-CM | POA: Diagnosis not present

## 2016-09-17 DIAGNOSIS — N186 End stage renal disease: Secondary | ICD-10-CM | POA: Diagnosis not present

## 2016-09-17 DIAGNOSIS — R8299 Other abnormal findings in urine: Secondary | ICD-10-CM | POA: Diagnosis not present

## 2016-09-17 DIAGNOSIS — N2589 Other disorders resulting from impaired renal tubular function: Secondary | ICD-10-CM | POA: Diagnosis not present

## 2016-09-17 DIAGNOSIS — E119 Type 2 diabetes mellitus without complications: Secondary | ICD-10-CM | POA: Diagnosis not present

## 2016-09-17 DIAGNOSIS — N2581 Secondary hyperparathyroidism of renal origin: Secondary | ICD-10-CM | POA: Diagnosis not present

## 2016-09-17 DIAGNOSIS — E784 Other hyperlipidemia: Secondary | ICD-10-CM | POA: Diagnosis not present

## 2016-09-17 DIAGNOSIS — D631 Anemia in chronic kidney disease: Secondary | ICD-10-CM | POA: Diagnosis not present

## 2016-09-17 DIAGNOSIS — D509 Iron deficiency anemia, unspecified: Secondary | ICD-10-CM | POA: Diagnosis not present

## 2016-09-18 DIAGNOSIS — N2581 Secondary hyperparathyroidism of renal origin: Secondary | ICD-10-CM | POA: Diagnosis not present

## 2016-09-18 DIAGNOSIS — D509 Iron deficiency anemia, unspecified: Secondary | ICD-10-CM | POA: Diagnosis not present

## 2016-09-18 DIAGNOSIS — E784 Other hyperlipidemia: Secondary | ICD-10-CM | POA: Diagnosis not present

## 2016-09-18 DIAGNOSIS — N2589 Other disorders resulting from impaired renal tubular function: Secondary | ICD-10-CM | POA: Diagnosis not present

## 2016-09-18 DIAGNOSIS — E119 Type 2 diabetes mellitus without complications: Secondary | ICD-10-CM | POA: Diagnosis not present

## 2016-09-18 DIAGNOSIS — N186 End stage renal disease: Secondary | ICD-10-CM | POA: Diagnosis not present

## 2016-09-18 DIAGNOSIS — D631 Anemia in chronic kidney disease: Secondary | ICD-10-CM | POA: Diagnosis not present

## 2016-09-18 DIAGNOSIS — Z23 Encounter for immunization: Secondary | ICD-10-CM | POA: Diagnosis not present

## 2016-09-18 DIAGNOSIS — R8299 Other abnormal findings in urine: Secondary | ICD-10-CM | POA: Diagnosis not present

## 2016-09-19 DIAGNOSIS — N2581 Secondary hyperparathyroidism of renal origin: Secondary | ICD-10-CM | POA: Diagnosis not present

## 2016-09-19 DIAGNOSIS — D631 Anemia in chronic kidney disease: Secondary | ICD-10-CM | POA: Diagnosis not present

## 2016-09-19 DIAGNOSIS — E784 Other hyperlipidemia: Secondary | ICD-10-CM | POA: Diagnosis not present

## 2016-09-19 DIAGNOSIS — N2589 Other disorders resulting from impaired renal tubular function: Secondary | ICD-10-CM | POA: Diagnosis not present

## 2016-09-19 DIAGNOSIS — Z992 Dependence on renal dialysis: Secondary | ICD-10-CM | POA: Diagnosis not present

## 2016-09-19 DIAGNOSIS — E119 Type 2 diabetes mellitus without complications: Secondary | ICD-10-CM | POA: Diagnosis not present

## 2016-09-19 DIAGNOSIS — N186 End stage renal disease: Secondary | ICD-10-CM | POA: Diagnosis not present

## 2016-09-19 DIAGNOSIS — D509 Iron deficiency anemia, unspecified: Secondary | ICD-10-CM | POA: Diagnosis not present

## 2016-09-19 DIAGNOSIS — Z23 Encounter for immunization: Secondary | ICD-10-CM | POA: Diagnosis not present

## 2016-09-19 DIAGNOSIS — E1122 Type 2 diabetes mellitus with diabetic chronic kidney disease: Secondary | ICD-10-CM | POA: Diagnosis not present

## 2016-09-19 DIAGNOSIS — R8299 Other abnormal findings in urine: Secondary | ICD-10-CM | POA: Diagnosis not present

## 2016-09-20 DIAGNOSIS — Z79899 Other long term (current) drug therapy: Secondary | ICD-10-CM | POA: Diagnosis not present

## 2016-09-20 DIAGNOSIS — N2589 Other disorders resulting from impaired renal tubular function: Secondary | ICD-10-CM | POA: Diagnosis not present

## 2016-09-20 DIAGNOSIS — E440 Moderate protein-calorie malnutrition: Secondary | ICD-10-CM | POA: Diagnosis not present

## 2016-09-20 DIAGNOSIS — N186 End stage renal disease: Secondary | ICD-10-CM | POA: Diagnosis not present

## 2016-09-20 DIAGNOSIS — D631 Anemia in chronic kidney disease: Secondary | ICD-10-CM | POA: Diagnosis not present

## 2016-09-20 DIAGNOSIS — R17 Unspecified jaundice: Secondary | ICD-10-CM | POA: Diagnosis not present

## 2016-09-20 DIAGNOSIS — R8299 Other abnormal findings in urine: Secondary | ICD-10-CM | POA: Diagnosis not present

## 2016-09-20 DIAGNOSIS — D509 Iron deficiency anemia, unspecified: Secondary | ICD-10-CM | POA: Diagnosis not present

## 2016-09-21 DIAGNOSIS — D509 Iron deficiency anemia, unspecified: Secondary | ICD-10-CM | POA: Diagnosis not present

## 2016-09-21 DIAGNOSIS — Z79899 Other long term (current) drug therapy: Secondary | ICD-10-CM | POA: Diagnosis not present

## 2016-09-21 DIAGNOSIS — N186 End stage renal disease: Secondary | ICD-10-CM | POA: Diagnosis not present

## 2016-09-21 DIAGNOSIS — R17 Unspecified jaundice: Secondary | ICD-10-CM | POA: Diagnosis not present

## 2016-09-21 DIAGNOSIS — R8299 Other abnormal findings in urine: Secondary | ICD-10-CM | POA: Diagnosis not present

## 2016-09-21 DIAGNOSIS — E440 Moderate protein-calorie malnutrition: Secondary | ICD-10-CM | POA: Diagnosis not present

## 2016-09-21 DIAGNOSIS — D631 Anemia in chronic kidney disease: Secondary | ICD-10-CM | POA: Diagnosis not present

## 2016-09-21 DIAGNOSIS — N2589 Other disorders resulting from impaired renal tubular function: Secondary | ICD-10-CM | POA: Diagnosis not present

## 2016-09-22 DIAGNOSIS — N2589 Other disorders resulting from impaired renal tubular function: Secondary | ICD-10-CM | POA: Diagnosis not present

## 2016-09-22 DIAGNOSIS — D631 Anemia in chronic kidney disease: Secondary | ICD-10-CM | POA: Diagnosis not present

## 2016-09-22 DIAGNOSIS — N186 End stage renal disease: Secondary | ICD-10-CM | POA: Diagnosis not present

## 2016-09-22 DIAGNOSIS — E440 Moderate protein-calorie malnutrition: Secondary | ICD-10-CM | POA: Diagnosis not present

## 2016-09-22 DIAGNOSIS — R8299 Other abnormal findings in urine: Secondary | ICD-10-CM | POA: Diagnosis not present

## 2016-09-22 DIAGNOSIS — Z79899 Other long term (current) drug therapy: Secondary | ICD-10-CM | POA: Diagnosis not present

## 2016-09-22 DIAGNOSIS — D509 Iron deficiency anemia, unspecified: Secondary | ICD-10-CM | POA: Diagnosis not present

## 2016-09-22 DIAGNOSIS — R17 Unspecified jaundice: Secondary | ICD-10-CM | POA: Diagnosis not present

## 2016-09-23 DIAGNOSIS — R8299 Other abnormal findings in urine: Secondary | ICD-10-CM | POA: Diagnosis not present

## 2016-09-23 DIAGNOSIS — N2589 Other disorders resulting from impaired renal tubular function: Secondary | ICD-10-CM | POA: Diagnosis not present

## 2016-09-23 DIAGNOSIS — R17 Unspecified jaundice: Secondary | ICD-10-CM | POA: Diagnosis not present

## 2016-09-23 DIAGNOSIS — E440 Moderate protein-calorie malnutrition: Secondary | ICD-10-CM | POA: Diagnosis not present

## 2016-09-23 DIAGNOSIS — D631 Anemia in chronic kidney disease: Secondary | ICD-10-CM | POA: Diagnosis not present

## 2016-09-23 DIAGNOSIS — Z79899 Other long term (current) drug therapy: Secondary | ICD-10-CM | POA: Diagnosis not present

## 2016-09-23 DIAGNOSIS — D509 Iron deficiency anemia, unspecified: Secondary | ICD-10-CM | POA: Diagnosis not present

## 2016-09-23 DIAGNOSIS — N186 End stage renal disease: Secondary | ICD-10-CM | POA: Diagnosis not present

## 2016-09-24 DIAGNOSIS — N2589 Other disorders resulting from impaired renal tubular function: Secondary | ICD-10-CM | POA: Diagnosis not present

## 2016-09-24 DIAGNOSIS — R17 Unspecified jaundice: Secondary | ICD-10-CM | POA: Diagnosis not present

## 2016-09-24 DIAGNOSIS — N186 End stage renal disease: Secondary | ICD-10-CM | POA: Diagnosis not present

## 2016-09-24 DIAGNOSIS — E440 Moderate protein-calorie malnutrition: Secondary | ICD-10-CM | POA: Diagnosis not present

## 2016-09-24 DIAGNOSIS — Z79899 Other long term (current) drug therapy: Secondary | ICD-10-CM | POA: Diagnosis not present

## 2016-09-24 DIAGNOSIS — R8299 Other abnormal findings in urine: Secondary | ICD-10-CM | POA: Diagnosis not present

## 2016-09-24 DIAGNOSIS — D509 Iron deficiency anemia, unspecified: Secondary | ICD-10-CM | POA: Diagnosis not present

## 2016-09-24 DIAGNOSIS — D631 Anemia in chronic kidney disease: Secondary | ICD-10-CM | POA: Diagnosis not present

## 2016-09-25 DIAGNOSIS — R17 Unspecified jaundice: Secondary | ICD-10-CM | POA: Diagnosis not present

## 2016-09-25 DIAGNOSIS — D509 Iron deficiency anemia, unspecified: Secondary | ICD-10-CM | POA: Diagnosis not present

## 2016-09-25 DIAGNOSIS — N2589 Other disorders resulting from impaired renal tubular function: Secondary | ICD-10-CM | POA: Diagnosis not present

## 2016-09-25 DIAGNOSIS — R8299 Other abnormal findings in urine: Secondary | ICD-10-CM | POA: Diagnosis not present

## 2016-09-25 DIAGNOSIS — E440 Moderate protein-calorie malnutrition: Secondary | ICD-10-CM | POA: Diagnosis not present

## 2016-09-25 DIAGNOSIS — D631 Anemia in chronic kidney disease: Secondary | ICD-10-CM | POA: Diagnosis not present

## 2016-09-25 DIAGNOSIS — N186 End stage renal disease: Secondary | ICD-10-CM | POA: Diagnosis not present

## 2016-09-25 DIAGNOSIS — Z79899 Other long term (current) drug therapy: Secondary | ICD-10-CM | POA: Diagnosis not present

## 2016-09-26 DIAGNOSIS — E440 Moderate protein-calorie malnutrition: Secondary | ICD-10-CM | POA: Diagnosis not present

## 2016-09-26 DIAGNOSIS — D631 Anemia in chronic kidney disease: Secondary | ICD-10-CM | POA: Diagnosis not present

## 2016-09-26 DIAGNOSIS — Z79899 Other long term (current) drug therapy: Secondary | ICD-10-CM | POA: Diagnosis not present

## 2016-09-26 DIAGNOSIS — N2589 Other disorders resulting from impaired renal tubular function: Secondary | ICD-10-CM | POA: Diagnosis not present

## 2016-09-26 DIAGNOSIS — D509 Iron deficiency anemia, unspecified: Secondary | ICD-10-CM | POA: Diagnosis not present

## 2016-09-26 DIAGNOSIS — R8299 Other abnormal findings in urine: Secondary | ICD-10-CM | POA: Diagnosis not present

## 2016-09-26 DIAGNOSIS — N186 End stage renal disease: Secondary | ICD-10-CM | POA: Diagnosis not present

## 2016-09-26 DIAGNOSIS — R17 Unspecified jaundice: Secondary | ICD-10-CM | POA: Diagnosis not present

## 2016-09-27 DIAGNOSIS — N2589 Other disorders resulting from impaired renal tubular function: Secondary | ICD-10-CM | POA: Diagnosis not present

## 2016-09-27 DIAGNOSIS — R17 Unspecified jaundice: Secondary | ICD-10-CM | POA: Diagnosis not present

## 2016-09-27 DIAGNOSIS — R8299 Other abnormal findings in urine: Secondary | ICD-10-CM | POA: Diagnosis not present

## 2016-09-27 DIAGNOSIS — Z79899 Other long term (current) drug therapy: Secondary | ICD-10-CM | POA: Diagnosis not present

## 2016-09-27 DIAGNOSIS — N186 End stage renal disease: Secondary | ICD-10-CM | POA: Diagnosis not present

## 2016-09-27 DIAGNOSIS — D509 Iron deficiency anemia, unspecified: Secondary | ICD-10-CM | POA: Diagnosis not present

## 2016-09-27 DIAGNOSIS — E440 Moderate protein-calorie malnutrition: Secondary | ICD-10-CM | POA: Diagnosis not present

## 2016-09-27 DIAGNOSIS — D631 Anemia in chronic kidney disease: Secondary | ICD-10-CM | POA: Diagnosis not present

## 2016-09-28 DIAGNOSIS — N2589 Other disorders resulting from impaired renal tubular function: Secondary | ICD-10-CM | POA: Diagnosis not present

## 2016-09-28 DIAGNOSIS — Z79899 Other long term (current) drug therapy: Secondary | ICD-10-CM | POA: Diagnosis not present

## 2016-09-28 DIAGNOSIS — D631 Anemia in chronic kidney disease: Secondary | ICD-10-CM | POA: Diagnosis not present

## 2016-09-28 DIAGNOSIS — R8299 Other abnormal findings in urine: Secondary | ICD-10-CM | POA: Diagnosis not present

## 2016-09-28 DIAGNOSIS — E440 Moderate protein-calorie malnutrition: Secondary | ICD-10-CM | POA: Diagnosis not present

## 2016-09-28 DIAGNOSIS — R17 Unspecified jaundice: Secondary | ICD-10-CM | POA: Diagnosis not present

## 2016-09-28 DIAGNOSIS — N186 End stage renal disease: Secondary | ICD-10-CM | POA: Diagnosis not present

## 2016-09-28 DIAGNOSIS — D509 Iron deficiency anemia, unspecified: Secondary | ICD-10-CM | POA: Diagnosis not present

## 2016-09-29 DIAGNOSIS — D631 Anemia in chronic kidney disease: Secondary | ICD-10-CM | POA: Diagnosis not present

## 2016-09-29 DIAGNOSIS — E440 Moderate protein-calorie malnutrition: Secondary | ICD-10-CM | POA: Diagnosis not present

## 2016-09-29 DIAGNOSIS — N186 End stage renal disease: Secondary | ICD-10-CM | POA: Diagnosis not present

## 2016-09-29 DIAGNOSIS — Z79899 Other long term (current) drug therapy: Secondary | ICD-10-CM | POA: Diagnosis not present

## 2016-09-29 DIAGNOSIS — R8299 Other abnormal findings in urine: Secondary | ICD-10-CM | POA: Diagnosis not present

## 2016-09-29 DIAGNOSIS — D509 Iron deficiency anemia, unspecified: Secondary | ICD-10-CM | POA: Diagnosis not present

## 2016-09-29 DIAGNOSIS — N2589 Other disorders resulting from impaired renal tubular function: Secondary | ICD-10-CM | POA: Diagnosis not present

## 2016-09-29 DIAGNOSIS — R17 Unspecified jaundice: Secondary | ICD-10-CM | POA: Diagnosis not present

## 2016-09-30 DIAGNOSIS — R8299 Other abnormal findings in urine: Secondary | ICD-10-CM | POA: Diagnosis not present

## 2016-09-30 DIAGNOSIS — D509 Iron deficiency anemia, unspecified: Secondary | ICD-10-CM | POA: Diagnosis not present

## 2016-09-30 DIAGNOSIS — N2589 Other disorders resulting from impaired renal tubular function: Secondary | ICD-10-CM | POA: Diagnosis not present

## 2016-09-30 DIAGNOSIS — Z79899 Other long term (current) drug therapy: Secondary | ICD-10-CM | POA: Diagnosis not present

## 2016-09-30 DIAGNOSIS — E440 Moderate protein-calorie malnutrition: Secondary | ICD-10-CM | POA: Diagnosis not present

## 2016-09-30 DIAGNOSIS — D631 Anemia in chronic kidney disease: Secondary | ICD-10-CM | POA: Diagnosis not present

## 2016-09-30 DIAGNOSIS — R17 Unspecified jaundice: Secondary | ICD-10-CM | POA: Diagnosis not present

## 2016-09-30 DIAGNOSIS — N186 End stage renal disease: Secondary | ICD-10-CM | POA: Diagnosis not present

## 2016-10-01 DIAGNOSIS — R17 Unspecified jaundice: Secondary | ICD-10-CM | POA: Diagnosis not present

## 2016-10-01 DIAGNOSIS — D631 Anemia in chronic kidney disease: Secondary | ICD-10-CM | POA: Diagnosis not present

## 2016-10-01 DIAGNOSIS — N2589 Other disorders resulting from impaired renal tubular function: Secondary | ICD-10-CM | POA: Diagnosis not present

## 2016-10-01 DIAGNOSIS — N186 End stage renal disease: Secondary | ICD-10-CM | POA: Diagnosis not present

## 2016-10-01 DIAGNOSIS — E440 Moderate protein-calorie malnutrition: Secondary | ICD-10-CM | POA: Diagnosis not present

## 2016-10-01 DIAGNOSIS — D509 Iron deficiency anemia, unspecified: Secondary | ICD-10-CM | POA: Diagnosis not present

## 2016-10-01 DIAGNOSIS — R8299 Other abnormal findings in urine: Secondary | ICD-10-CM | POA: Diagnosis not present

## 2016-10-01 DIAGNOSIS — Z79899 Other long term (current) drug therapy: Secondary | ICD-10-CM | POA: Diagnosis not present

## 2016-10-02 DIAGNOSIS — R8299 Other abnormal findings in urine: Secondary | ICD-10-CM | POA: Diagnosis not present

## 2016-10-02 DIAGNOSIS — N186 End stage renal disease: Secondary | ICD-10-CM | POA: Diagnosis not present

## 2016-10-02 DIAGNOSIS — R17 Unspecified jaundice: Secondary | ICD-10-CM | POA: Diagnosis not present

## 2016-10-02 DIAGNOSIS — D509 Iron deficiency anemia, unspecified: Secondary | ICD-10-CM | POA: Diagnosis not present

## 2016-10-02 DIAGNOSIS — E440 Moderate protein-calorie malnutrition: Secondary | ICD-10-CM | POA: Diagnosis not present

## 2016-10-02 DIAGNOSIS — Z79899 Other long term (current) drug therapy: Secondary | ICD-10-CM | POA: Diagnosis not present

## 2016-10-02 DIAGNOSIS — N2589 Other disorders resulting from impaired renal tubular function: Secondary | ICD-10-CM | POA: Diagnosis not present

## 2016-10-02 DIAGNOSIS — D631 Anemia in chronic kidney disease: Secondary | ICD-10-CM | POA: Diagnosis not present

## 2016-10-03 DIAGNOSIS — D509 Iron deficiency anemia, unspecified: Secondary | ICD-10-CM | POA: Diagnosis not present

## 2016-10-03 DIAGNOSIS — N2589 Other disorders resulting from impaired renal tubular function: Secondary | ICD-10-CM | POA: Diagnosis not present

## 2016-10-03 DIAGNOSIS — R8299 Other abnormal findings in urine: Secondary | ICD-10-CM | POA: Diagnosis not present

## 2016-10-03 DIAGNOSIS — R17 Unspecified jaundice: Secondary | ICD-10-CM | POA: Diagnosis not present

## 2016-10-03 DIAGNOSIS — Z79899 Other long term (current) drug therapy: Secondary | ICD-10-CM | POA: Diagnosis not present

## 2016-10-03 DIAGNOSIS — D631 Anemia in chronic kidney disease: Secondary | ICD-10-CM | POA: Diagnosis not present

## 2016-10-03 DIAGNOSIS — N186 End stage renal disease: Secondary | ICD-10-CM | POA: Diagnosis not present

## 2016-10-03 DIAGNOSIS — E440 Moderate protein-calorie malnutrition: Secondary | ICD-10-CM | POA: Diagnosis not present

## 2016-10-04 DIAGNOSIS — E440 Moderate protein-calorie malnutrition: Secondary | ICD-10-CM | POA: Diagnosis not present

## 2016-10-04 DIAGNOSIS — R8299 Other abnormal findings in urine: Secondary | ICD-10-CM | POA: Diagnosis not present

## 2016-10-04 DIAGNOSIS — R17 Unspecified jaundice: Secondary | ICD-10-CM | POA: Diagnosis not present

## 2016-10-04 DIAGNOSIS — D631 Anemia in chronic kidney disease: Secondary | ICD-10-CM | POA: Diagnosis not present

## 2016-10-04 DIAGNOSIS — N2589 Other disorders resulting from impaired renal tubular function: Secondary | ICD-10-CM | POA: Diagnosis not present

## 2016-10-04 DIAGNOSIS — Z79899 Other long term (current) drug therapy: Secondary | ICD-10-CM | POA: Diagnosis not present

## 2016-10-04 DIAGNOSIS — N186 End stage renal disease: Secondary | ICD-10-CM | POA: Diagnosis not present

## 2016-10-04 DIAGNOSIS — D509 Iron deficiency anemia, unspecified: Secondary | ICD-10-CM | POA: Diagnosis not present

## 2016-10-05 DIAGNOSIS — E440 Moderate protein-calorie malnutrition: Secondary | ICD-10-CM | POA: Diagnosis not present

## 2016-10-05 DIAGNOSIS — D631 Anemia in chronic kidney disease: Secondary | ICD-10-CM | POA: Diagnosis not present

## 2016-10-05 DIAGNOSIS — R17 Unspecified jaundice: Secondary | ICD-10-CM | POA: Diagnosis not present

## 2016-10-05 DIAGNOSIS — Z79899 Other long term (current) drug therapy: Secondary | ICD-10-CM | POA: Diagnosis not present

## 2016-10-05 DIAGNOSIS — N186 End stage renal disease: Secondary | ICD-10-CM | POA: Diagnosis not present

## 2016-10-05 DIAGNOSIS — R8299 Other abnormal findings in urine: Secondary | ICD-10-CM | POA: Diagnosis not present

## 2016-10-05 DIAGNOSIS — D509 Iron deficiency anemia, unspecified: Secondary | ICD-10-CM | POA: Diagnosis not present

## 2016-10-05 DIAGNOSIS — N2589 Other disorders resulting from impaired renal tubular function: Secondary | ICD-10-CM | POA: Diagnosis not present

## 2016-10-06 DIAGNOSIS — E440 Moderate protein-calorie malnutrition: Secondary | ICD-10-CM | POA: Diagnosis not present

## 2016-10-06 DIAGNOSIS — D631 Anemia in chronic kidney disease: Secondary | ICD-10-CM | POA: Diagnosis not present

## 2016-10-06 DIAGNOSIS — R8299 Other abnormal findings in urine: Secondary | ICD-10-CM | POA: Diagnosis not present

## 2016-10-06 DIAGNOSIS — D509 Iron deficiency anemia, unspecified: Secondary | ICD-10-CM | POA: Diagnosis not present

## 2016-10-06 DIAGNOSIS — N2589 Other disorders resulting from impaired renal tubular function: Secondary | ICD-10-CM | POA: Diagnosis not present

## 2016-10-06 DIAGNOSIS — N186 End stage renal disease: Secondary | ICD-10-CM | POA: Diagnosis not present

## 2016-10-06 DIAGNOSIS — Z79899 Other long term (current) drug therapy: Secondary | ICD-10-CM | POA: Diagnosis not present

## 2016-10-06 DIAGNOSIS — R17 Unspecified jaundice: Secondary | ICD-10-CM | POA: Diagnosis not present

## 2016-10-07 DIAGNOSIS — R17 Unspecified jaundice: Secondary | ICD-10-CM | POA: Diagnosis not present

## 2016-10-07 DIAGNOSIS — Z79899 Other long term (current) drug therapy: Secondary | ICD-10-CM | POA: Diagnosis not present

## 2016-10-07 DIAGNOSIS — D509 Iron deficiency anemia, unspecified: Secondary | ICD-10-CM | POA: Diagnosis not present

## 2016-10-07 DIAGNOSIS — R8299 Other abnormal findings in urine: Secondary | ICD-10-CM | POA: Diagnosis not present

## 2016-10-07 DIAGNOSIS — E440 Moderate protein-calorie malnutrition: Secondary | ICD-10-CM | POA: Diagnosis not present

## 2016-10-07 DIAGNOSIS — N186 End stage renal disease: Secondary | ICD-10-CM | POA: Diagnosis not present

## 2016-10-07 DIAGNOSIS — D631 Anemia in chronic kidney disease: Secondary | ICD-10-CM | POA: Diagnosis not present

## 2016-10-07 DIAGNOSIS — N2589 Other disorders resulting from impaired renal tubular function: Secondary | ICD-10-CM | POA: Diagnosis not present

## 2016-10-08 DIAGNOSIS — R17 Unspecified jaundice: Secondary | ICD-10-CM | POA: Diagnosis not present

## 2016-10-08 DIAGNOSIS — D509 Iron deficiency anemia, unspecified: Secondary | ICD-10-CM | POA: Diagnosis not present

## 2016-10-08 DIAGNOSIS — N2589 Other disorders resulting from impaired renal tubular function: Secondary | ICD-10-CM | POA: Diagnosis not present

## 2016-10-08 DIAGNOSIS — E440 Moderate protein-calorie malnutrition: Secondary | ICD-10-CM | POA: Diagnosis not present

## 2016-10-08 DIAGNOSIS — Z79899 Other long term (current) drug therapy: Secondary | ICD-10-CM | POA: Diagnosis not present

## 2016-10-08 DIAGNOSIS — N186 End stage renal disease: Secondary | ICD-10-CM | POA: Diagnosis not present

## 2016-10-08 DIAGNOSIS — D631 Anemia in chronic kidney disease: Secondary | ICD-10-CM | POA: Diagnosis not present

## 2016-10-08 DIAGNOSIS — R8299 Other abnormal findings in urine: Secondary | ICD-10-CM | POA: Diagnosis not present

## 2016-10-09 DIAGNOSIS — N186 End stage renal disease: Secondary | ICD-10-CM | POA: Diagnosis not present

## 2016-10-09 DIAGNOSIS — D509 Iron deficiency anemia, unspecified: Secondary | ICD-10-CM | POA: Diagnosis not present

## 2016-10-09 DIAGNOSIS — Z79899 Other long term (current) drug therapy: Secondary | ICD-10-CM | POA: Diagnosis not present

## 2016-10-09 DIAGNOSIS — N2589 Other disorders resulting from impaired renal tubular function: Secondary | ICD-10-CM | POA: Diagnosis not present

## 2016-10-09 DIAGNOSIS — E440 Moderate protein-calorie malnutrition: Secondary | ICD-10-CM | POA: Diagnosis not present

## 2016-10-09 DIAGNOSIS — R8299 Other abnormal findings in urine: Secondary | ICD-10-CM | POA: Diagnosis not present

## 2016-10-09 DIAGNOSIS — D631 Anemia in chronic kidney disease: Secondary | ICD-10-CM | POA: Diagnosis not present

## 2016-10-09 DIAGNOSIS — R17 Unspecified jaundice: Secondary | ICD-10-CM | POA: Diagnosis not present

## 2016-10-10 DIAGNOSIS — N2589 Other disorders resulting from impaired renal tubular function: Secondary | ICD-10-CM | POA: Diagnosis not present

## 2016-10-10 DIAGNOSIS — Z79899 Other long term (current) drug therapy: Secondary | ICD-10-CM | POA: Diagnosis not present

## 2016-10-10 DIAGNOSIS — N186 End stage renal disease: Secondary | ICD-10-CM | POA: Diagnosis not present

## 2016-10-10 DIAGNOSIS — D509 Iron deficiency anemia, unspecified: Secondary | ICD-10-CM | POA: Diagnosis not present

## 2016-10-10 DIAGNOSIS — R8299 Other abnormal findings in urine: Secondary | ICD-10-CM | POA: Diagnosis not present

## 2016-10-10 DIAGNOSIS — D631 Anemia in chronic kidney disease: Secondary | ICD-10-CM | POA: Diagnosis not present

## 2016-10-10 DIAGNOSIS — R17 Unspecified jaundice: Secondary | ICD-10-CM | POA: Diagnosis not present

## 2016-10-10 DIAGNOSIS — E440 Moderate protein-calorie malnutrition: Secondary | ICD-10-CM | POA: Diagnosis not present

## 2016-10-11 DIAGNOSIS — Z79899 Other long term (current) drug therapy: Secondary | ICD-10-CM | POA: Diagnosis not present

## 2016-10-11 DIAGNOSIS — R17 Unspecified jaundice: Secondary | ICD-10-CM | POA: Diagnosis not present

## 2016-10-11 DIAGNOSIS — N186 End stage renal disease: Secondary | ICD-10-CM | POA: Diagnosis not present

## 2016-10-11 DIAGNOSIS — E440 Moderate protein-calorie malnutrition: Secondary | ICD-10-CM | POA: Diagnosis not present

## 2016-10-11 DIAGNOSIS — R8299 Other abnormal findings in urine: Secondary | ICD-10-CM | POA: Diagnosis not present

## 2016-10-11 DIAGNOSIS — D509 Iron deficiency anemia, unspecified: Secondary | ICD-10-CM | POA: Diagnosis not present

## 2016-10-11 DIAGNOSIS — D631 Anemia in chronic kidney disease: Secondary | ICD-10-CM | POA: Diagnosis not present

## 2016-10-11 DIAGNOSIS — N2589 Other disorders resulting from impaired renal tubular function: Secondary | ICD-10-CM | POA: Diagnosis not present

## 2016-10-12 DIAGNOSIS — R8299 Other abnormal findings in urine: Secondary | ICD-10-CM | POA: Diagnosis not present

## 2016-10-12 DIAGNOSIS — N186 End stage renal disease: Secondary | ICD-10-CM | POA: Diagnosis not present

## 2016-10-12 DIAGNOSIS — D509 Iron deficiency anemia, unspecified: Secondary | ICD-10-CM | POA: Diagnosis not present

## 2016-10-12 DIAGNOSIS — N2589 Other disorders resulting from impaired renal tubular function: Secondary | ICD-10-CM | POA: Diagnosis not present

## 2016-10-12 DIAGNOSIS — Z79899 Other long term (current) drug therapy: Secondary | ICD-10-CM | POA: Diagnosis not present

## 2016-10-12 DIAGNOSIS — E440 Moderate protein-calorie malnutrition: Secondary | ICD-10-CM | POA: Diagnosis not present

## 2016-10-12 DIAGNOSIS — D631 Anemia in chronic kidney disease: Secondary | ICD-10-CM | POA: Diagnosis not present

## 2016-10-12 DIAGNOSIS — R17 Unspecified jaundice: Secondary | ICD-10-CM | POA: Diagnosis not present

## 2016-10-13 DIAGNOSIS — R17 Unspecified jaundice: Secondary | ICD-10-CM | POA: Diagnosis not present

## 2016-10-13 DIAGNOSIS — E440 Moderate protein-calorie malnutrition: Secondary | ICD-10-CM | POA: Diagnosis not present

## 2016-10-13 DIAGNOSIS — R8299 Other abnormal findings in urine: Secondary | ICD-10-CM | POA: Diagnosis not present

## 2016-10-13 DIAGNOSIS — Z79899 Other long term (current) drug therapy: Secondary | ICD-10-CM | POA: Diagnosis not present

## 2016-10-13 DIAGNOSIS — N2589 Other disorders resulting from impaired renal tubular function: Secondary | ICD-10-CM | POA: Diagnosis not present

## 2016-10-13 DIAGNOSIS — N186 End stage renal disease: Secondary | ICD-10-CM | POA: Diagnosis not present

## 2016-10-13 DIAGNOSIS — D509 Iron deficiency anemia, unspecified: Secondary | ICD-10-CM | POA: Diagnosis not present

## 2016-10-13 DIAGNOSIS — D631 Anemia in chronic kidney disease: Secondary | ICD-10-CM | POA: Diagnosis not present

## 2016-10-14 DIAGNOSIS — R8299 Other abnormal findings in urine: Secondary | ICD-10-CM | POA: Diagnosis not present

## 2016-10-14 DIAGNOSIS — Z79899 Other long term (current) drug therapy: Secondary | ICD-10-CM | POA: Diagnosis not present

## 2016-10-14 DIAGNOSIS — E440 Moderate protein-calorie malnutrition: Secondary | ICD-10-CM | POA: Diagnosis not present

## 2016-10-14 DIAGNOSIS — D509 Iron deficiency anemia, unspecified: Secondary | ICD-10-CM | POA: Diagnosis not present

## 2016-10-14 DIAGNOSIS — D631 Anemia in chronic kidney disease: Secondary | ICD-10-CM | POA: Diagnosis not present

## 2016-10-14 DIAGNOSIS — N2589 Other disorders resulting from impaired renal tubular function: Secondary | ICD-10-CM | POA: Diagnosis not present

## 2016-10-14 DIAGNOSIS — N186 End stage renal disease: Secondary | ICD-10-CM | POA: Diagnosis not present

## 2016-10-14 DIAGNOSIS — R17 Unspecified jaundice: Secondary | ICD-10-CM | POA: Diagnosis not present

## 2016-10-15 DIAGNOSIS — N2589 Other disorders resulting from impaired renal tubular function: Secondary | ICD-10-CM | POA: Diagnosis not present

## 2016-10-15 DIAGNOSIS — N186 End stage renal disease: Secondary | ICD-10-CM | POA: Diagnosis not present

## 2016-10-15 DIAGNOSIS — Z79899 Other long term (current) drug therapy: Secondary | ICD-10-CM | POA: Diagnosis not present

## 2016-10-15 DIAGNOSIS — R17 Unspecified jaundice: Secondary | ICD-10-CM | POA: Diagnosis not present

## 2016-10-15 DIAGNOSIS — R8299 Other abnormal findings in urine: Secondary | ICD-10-CM | POA: Diagnosis not present

## 2016-10-15 DIAGNOSIS — D509 Iron deficiency anemia, unspecified: Secondary | ICD-10-CM | POA: Diagnosis not present

## 2016-10-15 DIAGNOSIS — E440 Moderate protein-calorie malnutrition: Secondary | ICD-10-CM | POA: Diagnosis not present

## 2016-10-15 DIAGNOSIS — D631 Anemia in chronic kidney disease: Secondary | ICD-10-CM | POA: Diagnosis not present

## 2016-10-16 DIAGNOSIS — E440 Moderate protein-calorie malnutrition: Secondary | ICD-10-CM | POA: Diagnosis not present

## 2016-10-16 DIAGNOSIS — Z79899 Other long term (current) drug therapy: Secondary | ICD-10-CM | POA: Diagnosis not present

## 2016-10-16 DIAGNOSIS — D509 Iron deficiency anemia, unspecified: Secondary | ICD-10-CM | POA: Diagnosis not present

## 2016-10-16 DIAGNOSIS — D631 Anemia in chronic kidney disease: Secondary | ICD-10-CM | POA: Diagnosis not present

## 2016-10-16 DIAGNOSIS — N2589 Other disorders resulting from impaired renal tubular function: Secondary | ICD-10-CM | POA: Diagnosis not present

## 2016-10-16 DIAGNOSIS — R17 Unspecified jaundice: Secondary | ICD-10-CM | POA: Diagnosis not present

## 2016-10-16 DIAGNOSIS — N186 End stage renal disease: Secondary | ICD-10-CM | POA: Diagnosis not present

## 2016-10-16 DIAGNOSIS — R8299 Other abnormal findings in urine: Secondary | ICD-10-CM | POA: Diagnosis not present

## 2016-10-17 DIAGNOSIS — D509 Iron deficiency anemia, unspecified: Secondary | ICD-10-CM | POA: Diagnosis not present

## 2016-10-17 DIAGNOSIS — E440 Moderate protein-calorie malnutrition: Secondary | ICD-10-CM | POA: Diagnosis not present

## 2016-10-17 DIAGNOSIS — Z79899 Other long term (current) drug therapy: Secondary | ICD-10-CM | POA: Diagnosis not present

## 2016-10-17 DIAGNOSIS — N2589 Other disorders resulting from impaired renal tubular function: Secondary | ICD-10-CM | POA: Diagnosis not present

## 2016-10-17 DIAGNOSIS — R17 Unspecified jaundice: Secondary | ICD-10-CM | POA: Diagnosis not present

## 2016-10-17 DIAGNOSIS — D631 Anemia in chronic kidney disease: Secondary | ICD-10-CM | POA: Diagnosis not present

## 2016-10-17 DIAGNOSIS — R8299 Other abnormal findings in urine: Secondary | ICD-10-CM | POA: Diagnosis not present

## 2016-10-17 DIAGNOSIS — N186 End stage renal disease: Secondary | ICD-10-CM | POA: Diagnosis not present

## 2016-10-18 DIAGNOSIS — N2589 Other disorders resulting from impaired renal tubular function: Secondary | ICD-10-CM | POA: Diagnosis not present

## 2016-10-18 DIAGNOSIS — R17 Unspecified jaundice: Secondary | ICD-10-CM | POA: Diagnosis not present

## 2016-10-18 DIAGNOSIS — Z79899 Other long term (current) drug therapy: Secondary | ICD-10-CM | POA: Diagnosis not present

## 2016-10-18 DIAGNOSIS — N186 End stage renal disease: Secondary | ICD-10-CM | POA: Diagnosis not present

## 2016-10-18 DIAGNOSIS — D631 Anemia in chronic kidney disease: Secondary | ICD-10-CM | POA: Diagnosis not present

## 2016-10-18 DIAGNOSIS — E440 Moderate protein-calorie malnutrition: Secondary | ICD-10-CM | POA: Diagnosis not present

## 2016-10-18 DIAGNOSIS — D509 Iron deficiency anemia, unspecified: Secondary | ICD-10-CM | POA: Diagnosis not present

## 2016-10-18 DIAGNOSIS — R8299 Other abnormal findings in urine: Secondary | ICD-10-CM | POA: Diagnosis not present

## 2016-10-19 DIAGNOSIS — E440 Moderate protein-calorie malnutrition: Secondary | ICD-10-CM | POA: Diagnosis not present

## 2016-10-19 DIAGNOSIS — N186 End stage renal disease: Secondary | ICD-10-CM | POA: Diagnosis not present

## 2016-10-19 DIAGNOSIS — D509 Iron deficiency anemia, unspecified: Secondary | ICD-10-CM | POA: Diagnosis not present

## 2016-10-19 DIAGNOSIS — Z992 Dependence on renal dialysis: Secondary | ICD-10-CM | POA: Diagnosis not present

## 2016-10-19 DIAGNOSIS — D631 Anemia in chronic kidney disease: Secondary | ICD-10-CM | POA: Diagnosis not present

## 2016-10-19 DIAGNOSIS — N2589 Other disorders resulting from impaired renal tubular function: Secondary | ICD-10-CM | POA: Diagnosis not present

## 2016-10-19 DIAGNOSIS — Z79899 Other long term (current) drug therapy: Secondary | ICD-10-CM | POA: Diagnosis not present

## 2016-10-19 DIAGNOSIS — E1122 Type 2 diabetes mellitus with diabetic chronic kidney disease: Secondary | ICD-10-CM | POA: Diagnosis not present

## 2016-10-19 DIAGNOSIS — R17 Unspecified jaundice: Secondary | ICD-10-CM | POA: Diagnosis not present

## 2016-10-19 DIAGNOSIS — R8299 Other abnormal findings in urine: Secondary | ICD-10-CM | POA: Diagnosis not present

## 2016-10-20 DIAGNOSIS — D631 Anemia in chronic kidney disease: Secondary | ICD-10-CM | POA: Diagnosis not present

## 2016-10-20 DIAGNOSIS — N2589 Other disorders resulting from impaired renal tubular function: Secondary | ICD-10-CM | POA: Diagnosis not present

## 2016-10-20 DIAGNOSIS — E440 Moderate protein-calorie malnutrition: Secondary | ICD-10-CM | POA: Diagnosis not present

## 2016-10-20 DIAGNOSIS — D509 Iron deficiency anemia, unspecified: Secondary | ICD-10-CM | POA: Diagnosis not present

## 2016-10-20 DIAGNOSIS — N186 End stage renal disease: Secondary | ICD-10-CM | POA: Diagnosis not present

## 2016-10-20 DIAGNOSIS — R17 Unspecified jaundice: Secondary | ICD-10-CM | POA: Diagnosis not present

## 2016-10-20 DIAGNOSIS — Z79899 Other long term (current) drug therapy: Secondary | ICD-10-CM | POA: Diagnosis not present

## 2016-10-20 DIAGNOSIS — R8299 Other abnormal findings in urine: Secondary | ICD-10-CM | POA: Diagnosis not present

## 2016-10-21 DIAGNOSIS — Z79899 Other long term (current) drug therapy: Secondary | ICD-10-CM | POA: Diagnosis not present

## 2016-10-21 DIAGNOSIS — N186 End stage renal disease: Secondary | ICD-10-CM | POA: Diagnosis not present

## 2016-10-21 DIAGNOSIS — E440 Moderate protein-calorie malnutrition: Secondary | ICD-10-CM | POA: Diagnosis not present

## 2016-10-21 DIAGNOSIS — D509 Iron deficiency anemia, unspecified: Secondary | ICD-10-CM | POA: Diagnosis not present

## 2016-10-21 DIAGNOSIS — N2589 Other disorders resulting from impaired renal tubular function: Secondary | ICD-10-CM | POA: Diagnosis not present

## 2016-10-21 DIAGNOSIS — R8299 Other abnormal findings in urine: Secondary | ICD-10-CM | POA: Diagnosis not present

## 2016-10-21 DIAGNOSIS — D631 Anemia in chronic kidney disease: Secondary | ICD-10-CM | POA: Diagnosis not present

## 2016-10-21 DIAGNOSIS — R17 Unspecified jaundice: Secondary | ICD-10-CM | POA: Diagnosis not present

## 2016-10-22 DIAGNOSIS — R17 Unspecified jaundice: Secondary | ICD-10-CM | POA: Diagnosis not present

## 2016-10-22 DIAGNOSIS — D509 Iron deficiency anemia, unspecified: Secondary | ICD-10-CM | POA: Diagnosis not present

## 2016-10-22 DIAGNOSIS — N2589 Other disorders resulting from impaired renal tubular function: Secondary | ICD-10-CM | POA: Diagnosis not present

## 2016-10-22 DIAGNOSIS — D631 Anemia in chronic kidney disease: Secondary | ICD-10-CM | POA: Diagnosis not present

## 2016-10-22 DIAGNOSIS — R8299 Other abnormal findings in urine: Secondary | ICD-10-CM | POA: Diagnosis not present

## 2016-10-22 DIAGNOSIS — Z79899 Other long term (current) drug therapy: Secondary | ICD-10-CM | POA: Diagnosis not present

## 2016-10-22 DIAGNOSIS — E440 Moderate protein-calorie malnutrition: Secondary | ICD-10-CM | POA: Diagnosis not present

## 2016-10-22 DIAGNOSIS — N186 End stage renal disease: Secondary | ICD-10-CM | POA: Diagnosis not present

## 2016-10-23 DIAGNOSIS — N186 End stage renal disease: Secondary | ICD-10-CM | POA: Diagnosis not present

## 2016-10-23 DIAGNOSIS — Z79899 Other long term (current) drug therapy: Secondary | ICD-10-CM | POA: Diagnosis not present

## 2016-10-23 DIAGNOSIS — N2589 Other disorders resulting from impaired renal tubular function: Secondary | ICD-10-CM | POA: Diagnosis not present

## 2016-10-23 DIAGNOSIS — R8299 Other abnormal findings in urine: Secondary | ICD-10-CM | POA: Diagnosis not present

## 2016-10-23 DIAGNOSIS — D631 Anemia in chronic kidney disease: Secondary | ICD-10-CM | POA: Diagnosis not present

## 2016-10-23 DIAGNOSIS — R17 Unspecified jaundice: Secondary | ICD-10-CM | POA: Diagnosis not present

## 2016-10-23 DIAGNOSIS — D509 Iron deficiency anemia, unspecified: Secondary | ICD-10-CM | POA: Diagnosis not present

## 2016-10-23 DIAGNOSIS — E440 Moderate protein-calorie malnutrition: Secondary | ICD-10-CM | POA: Diagnosis not present

## 2016-10-24 DIAGNOSIS — D631 Anemia in chronic kidney disease: Secondary | ICD-10-CM | POA: Diagnosis not present

## 2016-10-24 DIAGNOSIS — E440 Moderate protein-calorie malnutrition: Secondary | ICD-10-CM | POA: Diagnosis not present

## 2016-10-24 DIAGNOSIS — N186 End stage renal disease: Secondary | ICD-10-CM | POA: Diagnosis not present

## 2016-10-24 DIAGNOSIS — R8299 Other abnormal findings in urine: Secondary | ICD-10-CM | POA: Diagnosis not present

## 2016-10-24 DIAGNOSIS — D509 Iron deficiency anemia, unspecified: Secondary | ICD-10-CM | POA: Diagnosis not present

## 2016-10-24 DIAGNOSIS — N2589 Other disorders resulting from impaired renal tubular function: Secondary | ICD-10-CM | POA: Diagnosis not present

## 2016-10-24 DIAGNOSIS — R17 Unspecified jaundice: Secondary | ICD-10-CM | POA: Diagnosis not present

## 2016-10-24 DIAGNOSIS — Z79899 Other long term (current) drug therapy: Secondary | ICD-10-CM | POA: Diagnosis not present

## 2016-10-25 DIAGNOSIS — E440 Moderate protein-calorie malnutrition: Secondary | ICD-10-CM | POA: Diagnosis not present

## 2016-10-25 DIAGNOSIS — D631 Anemia in chronic kidney disease: Secondary | ICD-10-CM | POA: Diagnosis not present

## 2016-10-25 DIAGNOSIS — R17 Unspecified jaundice: Secondary | ICD-10-CM | POA: Diagnosis not present

## 2016-10-25 DIAGNOSIS — Z79899 Other long term (current) drug therapy: Secondary | ICD-10-CM | POA: Diagnosis not present

## 2016-10-25 DIAGNOSIS — R8299 Other abnormal findings in urine: Secondary | ICD-10-CM | POA: Diagnosis not present

## 2016-10-25 DIAGNOSIS — N2589 Other disorders resulting from impaired renal tubular function: Secondary | ICD-10-CM | POA: Diagnosis not present

## 2016-10-25 DIAGNOSIS — N186 End stage renal disease: Secondary | ICD-10-CM | POA: Diagnosis not present

## 2016-10-25 DIAGNOSIS — D509 Iron deficiency anemia, unspecified: Secondary | ICD-10-CM | POA: Diagnosis not present

## 2016-10-26 DIAGNOSIS — R17 Unspecified jaundice: Secondary | ICD-10-CM | POA: Diagnosis not present

## 2016-10-26 DIAGNOSIS — N2589 Other disorders resulting from impaired renal tubular function: Secondary | ICD-10-CM | POA: Diagnosis not present

## 2016-10-26 DIAGNOSIS — D509 Iron deficiency anemia, unspecified: Secondary | ICD-10-CM | POA: Diagnosis not present

## 2016-10-26 DIAGNOSIS — Z79899 Other long term (current) drug therapy: Secondary | ICD-10-CM | POA: Diagnosis not present

## 2016-10-26 DIAGNOSIS — N186 End stage renal disease: Secondary | ICD-10-CM | POA: Diagnosis not present

## 2016-10-26 DIAGNOSIS — R8299 Other abnormal findings in urine: Secondary | ICD-10-CM | POA: Diagnosis not present

## 2016-10-26 DIAGNOSIS — D631 Anemia in chronic kidney disease: Secondary | ICD-10-CM | POA: Diagnosis not present

## 2016-10-26 DIAGNOSIS — E440 Moderate protein-calorie malnutrition: Secondary | ICD-10-CM | POA: Diagnosis not present

## 2016-10-27 DIAGNOSIS — R17 Unspecified jaundice: Secondary | ICD-10-CM | POA: Diagnosis not present

## 2016-10-27 DIAGNOSIS — E440 Moderate protein-calorie malnutrition: Secondary | ICD-10-CM | POA: Diagnosis not present

## 2016-10-27 DIAGNOSIS — Z79899 Other long term (current) drug therapy: Secondary | ICD-10-CM | POA: Diagnosis not present

## 2016-10-27 DIAGNOSIS — D509 Iron deficiency anemia, unspecified: Secondary | ICD-10-CM | POA: Diagnosis not present

## 2016-10-27 DIAGNOSIS — R8299 Other abnormal findings in urine: Secondary | ICD-10-CM | POA: Diagnosis not present

## 2016-10-27 DIAGNOSIS — D631 Anemia in chronic kidney disease: Secondary | ICD-10-CM | POA: Diagnosis not present

## 2016-10-27 DIAGNOSIS — N186 End stage renal disease: Secondary | ICD-10-CM | POA: Diagnosis not present

## 2016-10-27 DIAGNOSIS — N2589 Other disorders resulting from impaired renal tubular function: Secondary | ICD-10-CM | POA: Diagnosis not present

## 2016-10-28 DIAGNOSIS — N2589 Other disorders resulting from impaired renal tubular function: Secondary | ICD-10-CM | POA: Diagnosis not present

## 2016-10-28 DIAGNOSIS — R17 Unspecified jaundice: Secondary | ICD-10-CM | POA: Diagnosis not present

## 2016-10-28 DIAGNOSIS — D631 Anemia in chronic kidney disease: Secondary | ICD-10-CM | POA: Diagnosis not present

## 2016-10-28 DIAGNOSIS — R8299 Other abnormal findings in urine: Secondary | ICD-10-CM | POA: Diagnosis not present

## 2016-10-28 DIAGNOSIS — D509 Iron deficiency anemia, unspecified: Secondary | ICD-10-CM | POA: Diagnosis not present

## 2016-10-28 DIAGNOSIS — E440 Moderate protein-calorie malnutrition: Secondary | ICD-10-CM | POA: Diagnosis not present

## 2016-10-28 DIAGNOSIS — N186 End stage renal disease: Secondary | ICD-10-CM | POA: Diagnosis not present

## 2016-10-28 DIAGNOSIS — Z79899 Other long term (current) drug therapy: Secondary | ICD-10-CM | POA: Diagnosis not present

## 2016-10-29 DIAGNOSIS — D509 Iron deficiency anemia, unspecified: Secondary | ICD-10-CM | POA: Diagnosis not present

## 2016-10-29 DIAGNOSIS — D631 Anemia in chronic kidney disease: Secondary | ICD-10-CM | POA: Diagnosis not present

## 2016-10-29 DIAGNOSIS — Z79899 Other long term (current) drug therapy: Secondary | ICD-10-CM | POA: Diagnosis not present

## 2016-10-29 DIAGNOSIS — R8299 Other abnormal findings in urine: Secondary | ICD-10-CM | POA: Diagnosis not present

## 2016-10-29 DIAGNOSIS — E440 Moderate protein-calorie malnutrition: Secondary | ICD-10-CM | POA: Diagnosis not present

## 2016-10-29 DIAGNOSIS — N186 End stage renal disease: Secondary | ICD-10-CM | POA: Diagnosis not present

## 2016-10-29 DIAGNOSIS — N2589 Other disorders resulting from impaired renal tubular function: Secondary | ICD-10-CM | POA: Diagnosis not present

## 2016-10-29 DIAGNOSIS — R17 Unspecified jaundice: Secondary | ICD-10-CM | POA: Diagnosis not present

## 2016-10-30 DIAGNOSIS — D509 Iron deficiency anemia, unspecified: Secondary | ICD-10-CM | POA: Diagnosis not present

## 2016-10-30 DIAGNOSIS — R8299 Other abnormal findings in urine: Secondary | ICD-10-CM | POA: Diagnosis not present

## 2016-10-30 DIAGNOSIS — N2589 Other disorders resulting from impaired renal tubular function: Secondary | ICD-10-CM | POA: Diagnosis not present

## 2016-10-30 DIAGNOSIS — N186 End stage renal disease: Secondary | ICD-10-CM | POA: Diagnosis not present

## 2016-10-30 DIAGNOSIS — D631 Anemia in chronic kidney disease: Secondary | ICD-10-CM | POA: Diagnosis not present

## 2016-10-30 DIAGNOSIS — E440 Moderate protein-calorie malnutrition: Secondary | ICD-10-CM | POA: Diagnosis not present

## 2016-10-30 DIAGNOSIS — Z79899 Other long term (current) drug therapy: Secondary | ICD-10-CM | POA: Diagnosis not present

## 2016-10-30 DIAGNOSIS — R17 Unspecified jaundice: Secondary | ICD-10-CM | POA: Diagnosis not present

## 2016-10-31 DIAGNOSIS — E440 Moderate protein-calorie malnutrition: Secondary | ICD-10-CM | POA: Diagnosis not present

## 2016-10-31 DIAGNOSIS — D509 Iron deficiency anemia, unspecified: Secondary | ICD-10-CM | POA: Diagnosis not present

## 2016-10-31 DIAGNOSIS — D631 Anemia in chronic kidney disease: Secondary | ICD-10-CM | POA: Diagnosis not present

## 2016-10-31 DIAGNOSIS — R17 Unspecified jaundice: Secondary | ICD-10-CM | POA: Diagnosis not present

## 2016-10-31 DIAGNOSIS — Z79899 Other long term (current) drug therapy: Secondary | ICD-10-CM | POA: Diagnosis not present

## 2016-10-31 DIAGNOSIS — N2589 Other disorders resulting from impaired renal tubular function: Secondary | ICD-10-CM | POA: Diagnosis not present

## 2016-10-31 DIAGNOSIS — R8299 Other abnormal findings in urine: Secondary | ICD-10-CM | POA: Diagnosis not present

## 2016-10-31 DIAGNOSIS — N186 End stage renal disease: Secondary | ICD-10-CM | POA: Diagnosis not present

## 2016-11-01 DIAGNOSIS — E440 Moderate protein-calorie malnutrition: Secondary | ICD-10-CM | POA: Diagnosis not present

## 2016-11-01 DIAGNOSIS — N2589 Other disorders resulting from impaired renal tubular function: Secondary | ICD-10-CM | POA: Diagnosis not present

## 2016-11-01 DIAGNOSIS — D509 Iron deficiency anemia, unspecified: Secondary | ICD-10-CM | POA: Diagnosis not present

## 2016-11-01 DIAGNOSIS — N186 End stage renal disease: Secondary | ICD-10-CM | POA: Diagnosis not present

## 2016-11-01 DIAGNOSIS — D631 Anemia in chronic kidney disease: Secondary | ICD-10-CM | POA: Diagnosis not present

## 2016-11-01 DIAGNOSIS — Z79899 Other long term (current) drug therapy: Secondary | ICD-10-CM | POA: Diagnosis not present

## 2016-11-01 DIAGNOSIS — R17 Unspecified jaundice: Secondary | ICD-10-CM | POA: Diagnosis not present

## 2016-11-01 DIAGNOSIS — R8299 Other abnormal findings in urine: Secondary | ICD-10-CM | POA: Diagnosis not present

## 2016-11-02 DIAGNOSIS — R17 Unspecified jaundice: Secondary | ICD-10-CM | POA: Diagnosis not present

## 2016-11-02 DIAGNOSIS — D509 Iron deficiency anemia, unspecified: Secondary | ICD-10-CM | POA: Diagnosis not present

## 2016-11-02 DIAGNOSIS — N2589 Other disorders resulting from impaired renal tubular function: Secondary | ICD-10-CM | POA: Diagnosis not present

## 2016-11-02 DIAGNOSIS — Z79899 Other long term (current) drug therapy: Secondary | ICD-10-CM | POA: Diagnosis not present

## 2016-11-02 DIAGNOSIS — D631 Anemia in chronic kidney disease: Secondary | ICD-10-CM | POA: Diagnosis not present

## 2016-11-02 DIAGNOSIS — R8299 Other abnormal findings in urine: Secondary | ICD-10-CM | POA: Diagnosis not present

## 2016-11-02 DIAGNOSIS — N186 End stage renal disease: Secondary | ICD-10-CM | POA: Diagnosis not present

## 2016-11-02 DIAGNOSIS — E440 Moderate protein-calorie malnutrition: Secondary | ICD-10-CM | POA: Diagnosis not present

## 2016-11-03 DIAGNOSIS — Z79899 Other long term (current) drug therapy: Secondary | ICD-10-CM | POA: Diagnosis not present

## 2016-11-03 DIAGNOSIS — R8299 Other abnormal findings in urine: Secondary | ICD-10-CM | POA: Diagnosis not present

## 2016-11-03 DIAGNOSIS — R17 Unspecified jaundice: Secondary | ICD-10-CM | POA: Diagnosis not present

## 2016-11-03 DIAGNOSIS — D509 Iron deficiency anemia, unspecified: Secondary | ICD-10-CM | POA: Diagnosis not present

## 2016-11-03 DIAGNOSIS — N186 End stage renal disease: Secondary | ICD-10-CM | POA: Diagnosis not present

## 2016-11-03 DIAGNOSIS — N2589 Other disorders resulting from impaired renal tubular function: Secondary | ICD-10-CM | POA: Diagnosis not present

## 2016-11-03 DIAGNOSIS — E440 Moderate protein-calorie malnutrition: Secondary | ICD-10-CM | POA: Diagnosis not present

## 2016-11-03 DIAGNOSIS — D631 Anemia in chronic kidney disease: Secondary | ICD-10-CM | POA: Diagnosis not present

## 2016-11-04 ENCOUNTER — Other Ambulatory Visit: Payer: Self-pay | Admitting: Internal Medicine

## 2016-11-04 DIAGNOSIS — R8299 Other abnormal findings in urine: Secondary | ICD-10-CM | POA: Diagnosis not present

## 2016-11-04 DIAGNOSIS — N186 End stage renal disease: Secondary | ICD-10-CM | POA: Diagnosis not present

## 2016-11-04 DIAGNOSIS — N2589 Other disorders resulting from impaired renal tubular function: Secondary | ICD-10-CM | POA: Diagnosis not present

## 2016-11-04 DIAGNOSIS — D509 Iron deficiency anemia, unspecified: Secondary | ICD-10-CM | POA: Diagnosis not present

## 2016-11-04 DIAGNOSIS — Z79899 Other long term (current) drug therapy: Secondary | ICD-10-CM | POA: Diagnosis not present

## 2016-11-04 DIAGNOSIS — E440 Moderate protein-calorie malnutrition: Secondary | ICD-10-CM | POA: Diagnosis not present

## 2016-11-04 DIAGNOSIS — R17 Unspecified jaundice: Secondary | ICD-10-CM | POA: Diagnosis not present

## 2016-11-04 DIAGNOSIS — D631 Anemia in chronic kidney disease: Secondary | ICD-10-CM | POA: Diagnosis not present

## 2016-11-05 DIAGNOSIS — R8299 Other abnormal findings in urine: Secondary | ICD-10-CM | POA: Diagnosis not present

## 2016-11-05 DIAGNOSIS — N186 End stage renal disease: Secondary | ICD-10-CM | POA: Diagnosis not present

## 2016-11-05 DIAGNOSIS — E440 Moderate protein-calorie malnutrition: Secondary | ICD-10-CM | POA: Diagnosis not present

## 2016-11-05 DIAGNOSIS — D509 Iron deficiency anemia, unspecified: Secondary | ICD-10-CM | POA: Diagnosis not present

## 2016-11-05 DIAGNOSIS — D631 Anemia in chronic kidney disease: Secondary | ICD-10-CM | POA: Diagnosis not present

## 2016-11-05 DIAGNOSIS — R17 Unspecified jaundice: Secondary | ICD-10-CM | POA: Diagnosis not present

## 2016-11-05 DIAGNOSIS — Z79899 Other long term (current) drug therapy: Secondary | ICD-10-CM | POA: Diagnosis not present

## 2016-11-05 DIAGNOSIS — N2589 Other disorders resulting from impaired renal tubular function: Secondary | ICD-10-CM | POA: Diagnosis not present

## 2016-11-06 DIAGNOSIS — E440 Moderate protein-calorie malnutrition: Secondary | ICD-10-CM | POA: Diagnosis not present

## 2016-11-06 DIAGNOSIS — R17 Unspecified jaundice: Secondary | ICD-10-CM | POA: Diagnosis not present

## 2016-11-06 DIAGNOSIS — R8299 Other abnormal findings in urine: Secondary | ICD-10-CM | POA: Diagnosis not present

## 2016-11-06 DIAGNOSIS — D631 Anemia in chronic kidney disease: Secondary | ICD-10-CM | POA: Diagnosis not present

## 2016-11-06 DIAGNOSIS — N186 End stage renal disease: Secondary | ICD-10-CM | POA: Diagnosis not present

## 2016-11-06 DIAGNOSIS — N2589 Other disorders resulting from impaired renal tubular function: Secondary | ICD-10-CM | POA: Diagnosis not present

## 2016-11-06 DIAGNOSIS — D509 Iron deficiency anemia, unspecified: Secondary | ICD-10-CM | POA: Diagnosis not present

## 2016-11-06 DIAGNOSIS — Z79899 Other long term (current) drug therapy: Secondary | ICD-10-CM | POA: Diagnosis not present

## 2016-11-07 DIAGNOSIS — N2589 Other disorders resulting from impaired renal tubular function: Secondary | ICD-10-CM | POA: Diagnosis not present

## 2016-11-07 DIAGNOSIS — E440 Moderate protein-calorie malnutrition: Secondary | ICD-10-CM | POA: Diagnosis not present

## 2016-11-07 DIAGNOSIS — R17 Unspecified jaundice: Secondary | ICD-10-CM | POA: Diagnosis not present

## 2016-11-07 DIAGNOSIS — Z79899 Other long term (current) drug therapy: Secondary | ICD-10-CM | POA: Diagnosis not present

## 2016-11-07 DIAGNOSIS — D631 Anemia in chronic kidney disease: Secondary | ICD-10-CM | POA: Diagnosis not present

## 2016-11-07 DIAGNOSIS — D509 Iron deficiency anemia, unspecified: Secondary | ICD-10-CM | POA: Diagnosis not present

## 2016-11-07 DIAGNOSIS — N186 End stage renal disease: Secondary | ICD-10-CM | POA: Diagnosis not present

## 2016-11-07 DIAGNOSIS — R8299 Other abnormal findings in urine: Secondary | ICD-10-CM | POA: Diagnosis not present

## 2016-11-08 DIAGNOSIS — E440 Moderate protein-calorie malnutrition: Secondary | ICD-10-CM | POA: Diagnosis not present

## 2016-11-08 DIAGNOSIS — Z79899 Other long term (current) drug therapy: Secondary | ICD-10-CM | POA: Diagnosis not present

## 2016-11-08 DIAGNOSIS — D631 Anemia in chronic kidney disease: Secondary | ICD-10-CM | POA: Diagnosis not present

## 2016-11-08 DIAGNOSIS — N2589 Other disorders resulting from impaired renal tubular function: Secondary | ICD-10-CM | POA: Diagnosis not present

## 2016-11-08 DIAGNOSIS — D509 Iron deficiency anemia, unspecified: Secondary | ICD-10-CM | POA: Diagnosis not present

## 2016-11-08 DIAGNOSIS — R8299 Other abnormal findings in urine: Secondary | ICD-10-CM | POA: Diagnosis not present

## 2016-11-08 DIAGNOSIS — R17 Unspecified jaundice: Secondary | ICD-10-CM | POA: Diagnosis not present

## 2016-11-08 DIAGNOSIS — N186 End stage renal disease: Secondary | ICD-10-CM | POA: Diagnosis not present

## 2016-11-09 DIAGNOSIS — D509 Iron deficiency anemia, unspecified: Secondary | ICD-10-CM | POA: Diagnosis not present

## 2016-11-09 DIAGNOSIS — N186 End stage renal disease: Secondary | ICD-10-CM | POA: Diagnosis not present

## 2016-11-09 DIAGNOSIS — R8299 Other abnormal findings in urine: Secondary | ICD-10-CM | POA: Diagnosis not present

## 2016-11-09 DIAGNOSIS — D631 Anemia in chronic kidney disease: Secondary | ICD-10-CM | POA: Diagnosis not present

## 2016-11-09 DIAGNOSIS — Z79899 Other long term (current) drug therapy: Secondary | ICD-10-CM | POA: Diagnosis not present

## 2016-11-09 DIAGNOSIS — E440 Moderate protein-calorie malnutrition: Secondary | ICD-10-CM | POA: Diagnosis not present

## 2016-11-09 DIAGNOSIS — N2589 Other disorders resulting from impaired renal tubular function: Secondary | ICD-10-CM | POA: Diagnosis not present

## 2016-11-09 DIAGNOSIS — R17 Unspecified jaundice: Secondary | ICD-10-CM | POA: Diagnosis not present

## 2016-11-10 DIAGNOSIS — E440 Moderate protein-calorie malnutrition: Secondary | ICD-10-CM | POA: Diagnosis not present

## 2016-11-10 DIAGNOSIS — Z79899 Other long term (current) drug therapy: Secondary | ICD-10-CM | POA: Diagnosis not present

## 2016-11-10 DIAGNOSIS — R17 Unspecified jaundice: Secondary | ICD-10-CM | POA: Diagnosis not present

## 2016-11-10 DIAGNOSIS — D509 Iron deficiency anemia, unspecified: Secondary | ICD-10-CM | POA: Diagnosis not present

## 2016-11-10 DIAGNOSIS — R8299 Other abnormal findings in urine: Secondary | ICD-10-CM | POA: Diagnosis not present

## 2016-11-10 DIAGNOSIS — N186 End stage renal disease: Secondary | ICD-10-CM | POA: Diagnosis not present

## 2016-11-10 DIAGNOSIS — N2589 Other disorders resulting from impaired renal tubular function: Secondary | ICD-10-CM | POA: Diagnosis not present

## 2016-11-10 DIAGNOSIS — D631 Anemia in chronic kidney disease: Secondary | ICD-10-CM | POA: Diagnosis not present

## 2016-11-11 DIAGNOSIS — R8299 Other abnormal findings in urine: Secondary | ICD-10-CM | POA: Diagnosis not present

## 2016-11-11 DIAGNOSIS — Z79899 Other long term (current) drug therapy: Secondary | ICD-10-CM | POA: Diagnosis not present

## 2016-11-11 DIAGNOSIS — E440 Moderate protein-calorie malnutrition: Secondary | ICD-10-CM | POA: Diagnosis not present

## 2016-11-11 DIAGNOSIS — N2589 Other disorders resulting from impaired renal tubular function: Secondary | ICD-10-CM | POA: Diagnosis not present

## 2016-11-11 DIAGNOSIS — D631 Anemia in chronic kidney disease: Secondary | ICD-10-CM | POA: Diagnosis not present

## 2016-11-11 DIAGNOSIS — N186 End stage renal disease: Secondary | ICD-10-CM | POA: Diagnosis not present

## 2016-11-11 DIAGNOSIS — R17 Unspecified jaundice: Secondary | ICD-10-CM | POA: Diagnosis not present

## 2016-11-11 DIAGNOSIS — D509 Iron deficiency anemia, unspecified: Secondary | ICD-10-CM | POA: Diagnosis not present

## 2016-11-12 DIAGNOSIS — E440 Moderate protein-calorie malnutrition: Secondary | ICD-10-CM | POA: Diagnosis not present

## 2016-11-12 DIAGNOSIS — N2589 Other disorders resulting from impaired renal tubular function: Secondary | ICD-10-CM | POA: Diagnosis not present

## 2016-11-12 DIAGNOSIS — N186 End stage renal disease: Secondary | ICD-10-CM | POA: Diagnosis not present

## 2016-11-12 DIAGNOSIS — R17 Unspecified jaundice: Secondary | ICD-10-CM | POA: Diagnosis not present

## 2016-11-12 DIAGNOSIS — R8299 Other abnormal findings in urine: Secondary | ICD-10-CM | POA: Diagnosis not present

## 2016-11-12 DIAGNOSIS — D509 Iron deficiency anemia, unspecified: Secondary | ICD-10-CM | POA: Diagnosis not present

## 2016-11-12 DIAGNOSIS — D631 Anemia in chronic kidney disease: Secondary | ICD-10-CM | POA: Diagnosis not present

## 2016-11-12 DIAGNOSIS — Z79899 Other long term (current) drug therapy: Secondary | ICD-10-CM | POA: Diagnosis not present

## 2016-11-13 DIAGNOSIS — Z79899 Other long term (current) drug therapy: Secondary | ICD-10-CM | POA: Diagnosis not present

## 2016-11-13 DIAGNOSIS — E440 Moderate protein-calorie malnutrition: Secondary | ICD-10-CM | POA: Diagnosis not present

## 2016-11-13 DIAGNOSIS — R8299 Other abnormal findings in urine: Secondary | ICD-10-CM | POA: Diagnosis not present

## 2016-11-13 DIAGNOSIS — D631 Anemia in chronic kidney disease: Secondary | ICD-10-CM | POA: Diagnosis not present

## 2016-11-13 DIAGNOSIS — N186 End stage renal disease: Secondary | ICD-10-CM | POA: Diagnosis not present

## 2016-11-13 DIAGNOSIS — D509 Iron deficiency anemia, unspecified: Secondary | ICD-10-CM | POA: Diagnosis not present

## 2016-11-13 DIAGNOSIS — R17 Unspecified jaundice: Secondary | ICD-10-CM | POA: Diagnosis not present

## 2016-11-13 DIAGNOSIS — N2589 Other disorders resulting from impaired renal tubular function: Secondary | ICD-10-CM | POA: Diagnosis not present

## 2016-11-14 DIAGNOSIS — N186 End stage renal disease: Secondary | ICD-10-CM | POA: Diagnosis not present

## 2016-11-14 DIAGNOSIS — E440 Moderate protein-calorie malnutrition: Secondary | ICD-10-CM | POA: Diagnosis not present

## 2016-11-14 DIAGNOSIS — Z79899 Other long term (current) drug therapy: Secondary | ICD-10-CM | POA: Diagnosis not present

## 2016-11-14 DIAGNOSIS — D631 Anemia in chronic kidney disease: Secondary | ICD-10-CM | POA: Diagnosis not present

## 2016-11-14 DIAGNOSIS — R17 Unspecified jaundice: Secondary | ICD-10-CM | POA: Diagnosis not present

## 2016-11-14 DIAGNOSIS — D509 Iron deficiency anemia, unspecified: Secondary | ICD-10-CM | POA: Diagnosis not present

## 2016-11-14 DIAGNOSIS — N2589 Other disorders resulting from impaired renal tubular function: Secondary | ICD-10-CM | POA: Diagnosis not present

## 2016-11-14 DIAGNOSIS — R8299 Other abnormal findings in urine: Secondary | ICD-10-CM | POA: Diagnosis not present

## 2016-11-15 DIAGNOSIS — D509 Iron deficiency anemia, unspecified: Secondary | ICD-10-CM | POA: Diagnosis not present

## 2016-11-15 DIAGNOSIS — N2589 Other disorders resulting from impaired renal tubular function: Secondary | ICD-10-CM | POA: Diagnosis not present

## 2016-11-15 DIAGNOSIS — R8299 Other abnormal findings in urine: Secondary | ICD-10-CM | POA: Diagnosis not present

## 2016-11-15 DIAGNOSIS — Z79899 Other long term (current) drug therapy: Secondary | ICD-10-CM | POA: Diagnosis not present

## 2016-11-15 DIAGNOSIS — D631 Anemia in chronic kidney disease: Secondary | ICD-10-CM | POA: Diagnosis not present

## 2016-11-15 DIAGNOSIS — R17 Unspecified jaundice: Secondary | ICD-10-CM | POA: Diagnosis not present

## 2016-11-15 DIAGNOSIS — E440 Moderate protein-calorie malnutrition: Secondary | ICD-10-CM | POA: Diagnosis not present

## 2016-11-15 DIAGNOSIS — N186 End stage renal disease: Secondary | ICD-10-CM | POA: Diagnosis not present

## 2016-11-16 DIAGNOSIS — D509 Iron deficiency anemia, unspecified: Secondary | ICD-10-CM | POA: Diagnosis not present

## 2016-11-16 DIAGNOSIS — N2589 Other disorders resulting from impaired renal tubular function: Secondary | ICD-10-CM | POA: Diagnosis not present

## 2016-11-16 DIAGNOSIS — Z79899 Other long term (current) drug therapy: Secondary | ICD-10-CM | POA: Diagnosis not present

## 2016-11-16 DIAGNOSIS — D631 Anemia in chronic kidney disease: Secondary | ICD-10-CM | POA: Diagnosis not present

## 2016-11-16 DIAGNOSIS — N186 End stage renal disease: Secondary | ICD-10-CM | POA: Diagnosis not present

## 2016-11-16 DIAGNOSIS — E440 Moderate protein-calorie malnutrition: Secondary | ICD-10-CM | POA: Diagnosis not present

## 2016-11-16 DIAGNOSIS — R17 Unspecified jaundice: Secondary | ICD-10-CM | POA: Diagnosis not present

## 2016-11-16 DIAGNOSIS — R8299 Other abnormal findings in urine: Secondary | ICD-10-CM | POA: Diagnosis not present

## 2016-11-17 DIAGNOSIS — R17 Unspecified jaundice: Secondary | ICD-10-CM | POA: Diagnosis not present

## 2016-11-17 DIAGNOSIS — E440 Moderate protein-calorie malnutrition: Secondary | ICD-10-CM | POA: Diagnosis not present

## 2016-11-17 DIAGNOSIS — N2589 Other disorders resulting from impaired renal tubular function: Secondary | ICD-10-CM | POA: Diagnosis not present

## 2016-11-17 DIAGNOSIS — N186 End stage renal disease: Secondary | ICD-10-CM | POA: Diagnosis not present

## 2016-11-17 DIAGNOSIS — D631 Anemia in chronic kidney disease: Secondary | ICD-10-CM | POA: Diagnosis not present

## 2016-11-17 DIAGNOSIS — D509 Iron deficiency anemia, unspecified: Secondary | ICD-10-CM | POA: Diagnosis not present

## 2016-11-17 DIAGNOSIS — R8299 Other abnormal findings in urine: Secondary | ICD-10-CM | POA: Diagnosis not present

## 2016-11-17 DIAGNOSIS — Z79899 Other long term (current) drug therapy: Secondary | ICD-10-CM | POA: Diagnosis not present

## 2016-11-18 DIAGNOSIS — Z79899 Other long term (current) drug therapy: Secondary | ICD-10-CM | POA: Diagnosis not present

## 2016-11-18 DIAGNOSIS — N2589 Other disorders resulting from impaired renal tubular function: Secondary | ICD-10-CM | POA: Diagnosis not present

## 2016-11-18 DIAGNOSIS — R8299 Other abnormal findings in urine: Secondary | ICD-10-CM | POA: Diagnosis not present

## 2016-11-18 DIAGNOSIS — D631 Anemia in chronic kidney disease: Secondary | ICD-10-CM | POA: Diagnosis not present

## 2016-11-18 DIAGNOSIS — E440 Moderate protein-calorie malnutrition: Secondary | ICD-10-CM | POA: Diagnosis not present

## 2016-11-18 DIAGNOSIS — N186 End stage renal disease: Secondary | ICD-10-CM | POA: Diagnosis not present

## 2016-11-18 DIAGNOSIS — D509 Iron deficiency anemia, unspecified: Secondary | ICD-10-CM | POA: Diagnosis not present

## 2016-11-18 DIAGNOSIS — R17 Unspecified jaundice: Secondary | ICD-10-CM | POA: Diagnosis not present

## 2016-11-19 DIAGNOSIS — R8299 Other abnormal findings in urine: Secondary | ICD-10-CM | POA: Diagnosis not present

## 2016-11-19 DIAGNOSIS — E440 Moderate protein-calorie malnutrition: Secondary | ICD-10-CM | POA: Diagnosis not present

## 2016-11-19 DIAGNOSIS — Z992 Dependence on renal dialysis: Secondary | ICD-10-CM | POA: Diagnosis not present

## 2016-11-19 DIAGNOSIS — D509 Iron deficiency anemia, unspecified: Secondary | ICD-10-CM | POA: Diagnosis not present

## 2016-11-19 DIAGNOSIS — D631 Anemia in chronic kidney disease: Secondary | ICD-10-CM | POA: Diagnosis not present

## 2016-11-19 DIAGNOSIS — N2589 Other disorders resulting from impaired renal tubular function: Secondary | ICD-10-CM | POA: Diagnosis not present

## 2016-11-19 DIAGNOSIS — Z79899 Other long term (current) drug therapy: Secondary | ICD-10-CM | POA: Diagnosis not present

## 2016-11-19 DIAGNOSIS — N186 End stage renal disease: Secondary | ICD-10-CM | POA: Diagnosis not present

## 2016-11-19 DIAGNOSIS — E1122 Type 2 diabetes mellitus with diabetic chronic kidney disease: Secondary | ICD-10-CM | POA: Diagnosis not present

## 2016-11-19 DIAGNOSIS — R17 Unspecified jaundice: Secondary | ICD-10-CM | POA: Diagnosis not present

## 2016-11-20 DIAGNOSIS — E784 Other hyperlipidemia: Secondary | ICD-10-CM | POA: Diagnosis not present

## 2016-11-20 DIAGNOSIS — D631 Anemia in chronic kidney disease: Secondary | ICD-10-CM | POA: Diagnosis not present

## 2016-11-20 DIAGNOSIS — E440 Moderate protein-calorie malnutrition: Secondary | ICD-10-CM | POA: Diagnosis not present

## 2016-11-20 DIAGNOSIS — D509 Iron deficiency anemia, unspecified: Secondary | ICD-10-CM | POA: Diagnosis not present

## 2016-11-20 DIAGNOSIS — N2581 Secondary hyperparathyroidism of renal origin: Secondary | ICD-10-CM | POA: Diagnosis not present

## 2016-11-20 DIAGNOSIS — Z4932 Encounter for adequacy testing for peritoneal dialysis: Secondary | ICD-10-CM | POA: Diagnosis not present

## 2016-11-20 DIAGNOSIS — N2589 Other disorders resulting from impaired renal tubular function: Secondary | ICD-10-CM | POA: Diagnosis not present

## 2016-11-20 DIAGNOSIS — N186 End stage renal disease: Secondary | ICD-10-CM | POA: Diagnosis not present

## 2016-11-20 DIAGNOSIS — E119 Type 2 diabetes mellitus without complications: Secondary | ICD-10-CM | POA: Diagnosis not present

## 2016-11-20 DIAGNOSIS — D513 Other dietary vitamin B12 deficiency anemia: Secondary | ICD-10-CM | POA: Diagnosis not present

## 2016-11-21 DIAGNOSIS — N186 End stage renal disease: Secondary | ICD-10-CM | POA: Diagnosis not present

## 2016-11-21 DIAGNOSIS — E119 Type 2 diabetes mellitus without complications: Secondary | ICD-10-CM | POA: Diagnosis not present

## 2016-11-21 DIAGNOSIS — N2581 Secondary hyperparathyroidism of renal origin: Secondary | ICD-10-CM | POA: Diagnosis not present

## 2016-11-21 DIAGNOSIS — E440 Moderate protein-calorie malnutrition: Secondary | ICD-10-CM | POA: Diagnosis not present

## 2016-11-21 DIAGNOSIS — N2589 Other disorders resulting from impaired renal tubular function: Secondary | ICD-10-CM | POA: Diagnosis not present

## 2016-11-21 DIAGNOSIS — D513 Other dietary vitamin B12 deficiency anemia: Secondary | ICD-10-CM | POA: Diagnosis not present

## 2016-11-21 DIAGNOSIS — D509 Iron deficiency anemia, unspecified: Secondary | ICD-10-CM | POA: Diagnosis not present

## 2016-11-21 DIAGNOSIS — Z4932 Encounter for adequacy testing for peritoneal dialysis: Secondary | ICD-10-CM | POA: Diagnosis not present

## 2016-11-21 DIAGNOSIS — E784 Other hyperlipidemia: Secondary | ICD-10-CM | POA: Diagnosis not present

## 2016-11-21 DIAGNOSIS — D631 Anemia in chronic kidney disease: Secondary | ICD-10-CM | POA: Diagnosis not present

## 2016-11-22 DIAGNOSIS — N186 End stage renal disease: Secondary | ICD-10-CM | POA: Diagnosis not present

## 2016-11-22 DIAGNOSIS — D509 Iron deficiency anemia, unspecified: Secondary | ICD-10-CM | POA: Diagnosis not present

## 2016-11-22 DIAGNOSIS — E440 Moderate protein-calorie malnutrition: Secondary | ICD-10-CM | POA: Diagnosis not present

## 2016-11-22 DIAGNOSIS — D631 Anemia in chronic kidney disease: Secondary | ICD-10-CM | POA: Diagnosis not present

## 2016-11-22 DIAGNOSIS — E784 Other hyperlipidemia: Secondary | ICD-10-CM | POA: Diagnosis not present

## 2016-11-22 DIAGNOSIS — Z4932 Encounter for adequacy testing for peritoneal dialysis: Secondary | ICD-10-CM | POA: Diagnosis not present

## 2016-11-22 DIAGNOSIS — E119 Type 2 diabetes mellitus without complications: Secondary | ICD-10-CM | POA: Diagnosis not present

## 2016-11-22 DIAGNOSIS — D513 Other dietary vitamin B12 deficiency anemia: Secondary | ICD-10-CM | POA: Diagnosis not present

## 2016-11-22 DIAGNOSIS — N2589 Other disorders resulting from impaired renal tubular function: Secondary | ICD-10-CM | POA: Diagnosis not present

## 2016-11-22 DIAGNOSIS — N2581 Secondary hyperparathyroidism of renal origin: Secondary | ICD-10-CM | POA: Diagnosis not present

## 2016-11-23 DIAGNOSIS — N2581 Secondary hyperparathyroidism of renal origin: Secondary | ICD-10-CM | POA: Diagnosis not present

## 2016-11-23 DIAGNOSIS — N2589 Other disorders resulting from impaired renal tubular function: Secondary | ICD-10-CM | POA: Diagnosis not present

## 2016-11-23 DIAGNOSIS — D509 Iron deficiency anemia, unspecified: Secondary | ICD-10-CM | POA: Diagnosis not present

## 2016-11-23 DIAGNOSIS — Z4932 Encounter for adequacy testing for peritoneal dialysis: Secondary | ICD-10-CM | POA: Diagnosis not present

## 2016-11-23 DIAGNOSIS — E440 Moderate protein-calorie malnutrition: Secondary | ICD-10-CM | POA: Diagnosis not present

## 2016-11-23 DIAGNOSIS — N186 End stage renal disease: Secondary | ICD-10-CM | POA: Diagnosis not present

## 2016-11-23 DIAGNOSIS — E784 Other hyperlipidemia: Secondary | ICD-10-CM | POA: Diagnosis not present

## 2016-11-23 DIAGNOSIS — D631 Anemia in chronic kidney disease: Secondary | ICD-10-CM | POA: Diagnosis not present

## 2016-11-23 DIAGNOSIS — E119 Type 2 diabetes mellitus without complications: Secondary | ICD-10-CM | POA: Diagnosis not present

## 2016-11-23 DIAGNOSIS — D513 Other dietary vitamin B12 deficiency anemia: Secondary | ICD-10-CM | POA: Diagnosis not present

## 2016-11-24 DIAGNOSIS — D631 Anemia in chronic kidney disease: Secondary | ICD-10-CM | POA: Diagnosis not present

## 2016-11-24 DIAGNOSIS — N2581 Secondary hyperparathyroidism of renal origin: Secondary | ICD-10-CM | POA: Diagnosis not present

## 2016-11-24 DIAGNOSIS — E784 Other hyperlipidemia: Secondary | ICD-10-CM | POA: Diagnosis not present

## 2016-11-24 DIAGNOSIS — N2589 Other disorders resulting from impaired renal tubular function: Secondary | ICD-10-CM | POA: Diagnosis not present

## 2016-11-24 DIAGNOSIS — E440 Moderate protein-calorie malnutrition: Secondary | ICD-10-CM | POA: Diagnosis not present

## 2016-11-24 DIAGNOSIS — E119 Type 2 diabetes mellitus without complications: Secondary | ICD-10-CM | POA: Diagnosis not present

## 2016-11-24 DIAGNOSIS — D513 Other dietary vitamin B12 deficiency anemia: Secondary | ICD-10-CM | POA: Diagnosis not present

## 2016-11-24 DIAGNOSIS — D509 Iron deficiency anemia, unspecified: Secondary | ICD-10-CM | POA: Diagnosis not present

## 2016-11-24 DIAGNOSIS — N186 End stage renal disease: Secondary | ICD-10-CM | POA: Diagnosis not present

## 2016-11-24 DIAGNOSIS — Z4932 Encounter for adequacy testing for peritoneal dialysis: Secondary | ICD-10-CM | POA: Diagnosis not present

## 2016-11-25 DIAGNOSIS — D631 Anemia in chronic kidney disease: Secondary | ICD-10-CM | POA: Diagnosis not present

## 2016-11-25 DIAGNOSIS — D509 Iron deficiency anemia, unspecified: Secondary | ICD-10-CM | POA: Diagnosis not present

## 2016-11-25 DIAGNOSIS — N186 End stage renal disease: Secondary | ICD-10-CM | POA: Diagnosis not present

## 2016-11-25 DIAGNOSIS — N2589 Other disorders resulting from impaired renal tubular function: Secondary | ICD-10-CM | POA: Diagnosis not present

## 2016-11-25 DIAGNOSIS — E440 Moderate protein-calorie malnutrition: Secondary | ICD-10-CM | POA: Diagnosis not present

## 2016-11-25 DIAGNOSIS — Z4932 Encounter for adequacy testing for peritoneal dialysis: Secondary | ICD-10-CM | POA: Diagnosis not present

## 2016-11-25 DIAGNOSIS — E119 Type 2 diabetes mellitus without complications: Secondary | ICD-10-CM | POA: Diagnosis not present

## 2016-11-25 DIAGNOSIS — E784 Other hyperlipidemia: Secondary | ICD-10-CM | POA: Diagnosis not present

## 2016-11-25 DIAGNOSIS — N2581 Secondary hyperparathyroidism of renal origin: Secondary | ICD-10-CM | POA: Diagnosis not present

## 2016-11-25 DIAGNOSIS — D513 Other dietary vitamin B12 deficiency anemia: Secondary | ICD-10-CM | POA: Diagnosis not present

## 2016-11-26 DIAGNOSIS — N186 End stage renal disease: Secondary | ICD-10-CM | POA: Diagnosis not present

## 2016-11-26 DIAGNOSIS — D513 Other dietary vitamin B12 deficiency anemia: Secondary | ICD-10-CM | POA: Diagnosis not present

## 2016-11-26 DIAGNOSIS — E784 Other hyperlipidemia: Secondary | ICD-10-CM | POA: Diagnosis not present

## 2016-11-26 DIAGNOSIS — N2589 Other disorders resulting from impaired renal tubular function: Secondary | ICD-10-CM | POA: Diagnosis not present

## 2016-11-26 DIAGNOSIS — N2581 Secondary hyperparathyroidism of renal origin: Secondary | ICD-10-CM | POA: Diagnosis not present

## 2016-11-26 DIAGNOSIS — Z4932 Encounter for adequacy testing for peritoneal dialysis: Secondary | ICD-10-CM | POA: Diagnosis not present

## 2016-11-26 DIAGNOSIS — E440 Moderate protein-calorie malnutrition: Secondary | ICD-10-CM | POA: Diagnosis not present

## 2016-11-26 DIAGNOSIS — D509 Iron deficiency anemia, unspecified: Secondary | ICD-10-CM | POA: Diagnosis not present

## 2016-11-26 DIAGNOSIS — E119 Type 2 diabetes mellitus without complications: Secondary | ICD-10-CM | POA: Diagnosis not present

## 2016-11-26 DIAGNOSIS — D631 Anemia in chronic kidney disease: Secondary | ICD-10-CM | POA: Diagnosis not present

## 2016-11-27 DIAGNOSIS — Z4932 Encounter for adequacy testing for peritoneal dialysis: Secondary | ICD-10-CM | POA: Diagnosis not present

## 2016-11-27 DIAGNOSIS — N2581 Secondary hyperparathyroidism of renal origin: Secondary | ICD-10-CM | POA: Diagnosis not present

## 2016-11-27 DIAGNOSIS — N186 End stage renal disease: Secondary | ICD-10-CM | POA: Diagnosis not present

## 2016-11-27 DIAGNOSIS — D509 Iron deficiency anemia, unspecified: Secondary | ICD-10-CM | POA: Diagnosis not present

## 2016-11-27 DIAGNOSIS — E119 Type 2 diabetes mellitus without complications: Secondary | ICD-10-CM | POA: Diagnosis not present

## 2016-11-27 DIAGNOSIS — E440 Moderate protein-calorie malnutrition: Secondary | ICD-10-CM | POA: Diagnosis not present

## 2016-11-27 DIAGNOSIS — D513 Other dietary vitamin B12 deficiency anemia: Secondary | ICD-10-CM | POA: Diagnosis not present

## 2016-11-27 DIAGNOSIS — D631 Anemia in chronic kidney disease: Secondary | ICD-10-CM | POA: Diagnosis not present

## 2016-11-27 DIAGNOSIS — N2589 Other disorders resulting from impaired renal tubular function: Secondary | ICD-10-CM | POA: Diagnosis not present

## 2016-11-27 DIAGNOSIS — E784 Other hyperlipidemia: Secondary | ICD-10-CM | POA: Diagnosis not present

## 2016-11-28 DIAGNOSIS — E784 Other hyperlipidemia: Secondary | ICD-10-CM | POA: Diagnosis not present

## 2016-11-28 DIAGNOSIS — D631 Anemia in chronic kidney disease: Secondary | ICD-10-CM | POA: Diagnosis not present

## 2016-11-28 DIAGNOSIS — N2589 Other disorders resulting from impaired renal tubular function: Secondary | ICD-10-CM | POA: Diagnosis not present

## 2016-11-28 DIAGNOSIS — D509 Iron deficiency anemia, unspecified: Secondary | ICD-10-CM | POA: Diagnosis not present

## 2016-11-28 DIAGNOSIS — Z4932 Encounter for adequacy testing for peritoneal dialysis: Secondary | ICD-10-CM | POA: Diagnosis not present

## 2016-11-28 DIAGNOSIS — N186 End stage renal disease: Secondary | ICD-10-CM | POA: Diagnosis not present

## 2016-11-28 DIAGNOSIS — E119 Type 2 diabetes mellitus without complications: Secondary | ICD-10-CM | POA: Diagnosis not present

## 2016-11-28 DIAGNOSIS — N2581 Secondary hyperparathyroidism of renal origin: Secondary | ICD-10-CM | POA: Diagnosis not present

## 2016-11-28 DIAGNOSIS — E440 Moderate protein-calorie malnutrition: Secondary | ICD-10-CM | POA: Diagnosis not present

## 2016-11-28 DIAGNOSIS — D513 Other dietary vitamin B12 deficiency anemia: Secondary | ICD-10-CM | POA: Diagnosis not present

## 2016-11-29 DIAGNOSIS — E119 Type 2 diabetes mellitus without complications: Secondary | ICD-10-CM | POA: Diagnosis not present

## 2016-11-29 DIAGNOSIS — D509 Iron deficiency anemia, unspecified: Secondary | ICD-10-CM | POA: Diagnosis not present

## 2016-11-29 DIAGNOSIS — D513 Other dietary vitamin B12 deficiency anemia: Secondary | ICD-10-CM | POA: Diagnosis not present

## 2016-11-29 DIAGNOSIS — D631 Anemia in chronic kidney disease: Secondary | ICD-10-CM | POA: Diagnosis not present

## 2016-11-29 DIAGNOSIS — E784 Other hyperlipidemia: Secondary | ICD-10-CM | POA: Diagnosis not present

## 2016-11-29 DIAGNOSIS — E440 Moderate protein-calorie malnutrition: Secondary | ICD-10-CM | POA: Diagnosis not present

## 2016-11-29 DIAGNOSIS — N2589 Other disorders resulting from impaired renal tubular function: Secondary | ICD-10-CM | POA: Diagnosis not present

## 2016-11-29 DIAGNOSIS — Z4932 Encounter for adequacy testing for peritoneal dialysis: Secondary | ICD-10-CM | POA: Diagnosis not present

## 2016-11-29 DIAGNOSIS — N186 End stage renal disease: Secondary | ICD-10-CM | POA: Diagnosis not present

## 2016-11-29 DIAGNOSIS — N2581 Secondary hyperparathyroidism of renal origin: Secondary | ICD-10-CM | POA: Diagnosis not present

## 2016-11-30 DIAGNOSIS — N186 End stage renal disease: Secondary | ICD-10-CM | POA: Diagnosis not present

## 2016-11-30 DIAGNOSIS — N2589 Other disorders resulting from impaired renal tubular function: Secondary | ICD-10-CM | POA: Diagnosis not present

## 2016-11-30 DIAGNOSIS — N2581 Secondary hyperparathyroidism of renal origin: Secondary | ICD-10-CM | POA: Diagnosis not present

## 2016-11-30 DIAGNOSIS — E440 Moderate protein-calorie malnutrition: Secondary | ICD-10-CM | POA: Diagnosis not present

## 2016-11-30 DIAGNOSIS — Z4932 Encounter for adequacy testing for peritoneal dialysis: Secondary | ICD-10-CM | POA: Diagnosis not present

## 2016-11-30 DIAGNOSIS — D631 Anemia in chronic kidney disease: Secondary | ICD-10-CM | POA: Diagnosis not present

## 2016-11-30 DIAGNOSIS — D509 Iron deficiency anemia, unspecified: Secondary | ICD-10-CM | POA: Diagnosis not present

## 2016-11-30 DIAGNOSIS — E119 Type 2 diabetes mellitus without complications: Secondary | ICD-10-CM | POA: Diagnosis not present

## 2016-11-30 DIAGNOSIS — E784 Other hyperlipidemia: Secondary | ICD-10-CM | POA: Diagnosis not present

## 2016-11-30 DIAGNOSIS — D513 Other dietary vitamin B12 deficiency anemia: Secondary | ICD-10-CM | POA: Diagnosis not present

## 2016-12-01 DIAGNOSIS — D631 Anemia in chronic kidney disease: Secondary | ICD-10-CM | POA: Diagnosis not present

## 2016-12-01 DIAGNOSIS — N186 End stage renal disease: Secondary | ICD-10-CM | POA: Diagnosis not present

## 2016-12-01 DIAGNOSIS — D509 Iron deficiency anemia, unspecified: Secondary | ICD-10-CM | POA: Diagnosis not present

## 2016-12-01 DIAGNOSIS — N2589 Other disorders resulting from impaired renal tubular function: Secondary | ICD-10-CM | POA: Diagnosis not present

## 2016-12-01 DIAGNOSIS — E119 Type 2 diabetes mellitus without complications: Secondary | ICD-10-CM | POA: Diagnosis not present

## 2016-12-01 DIAGNOSIS — N2581 Secondary hyperparathyroidism of renal origin: Secondary | ICD-10-CM | POA: Diagnosis not present

## 2016-12-01 DIAGNOSIS — E440 Moderate protein-calorie malnutrition: Secondary | ICD-10-CM | POA: Diagnosis not present

## 2016-12-01 DIAGNOSIS — D513 Other dietary vitamin B12 deficiency anemia: Secondary | ICD-10-CM | POA: Diagnosis not present

## 2016-12-01 DIAGNOSIS — E784 Other hyperlipidemia: Secondary | ICD-10-CM | POA: Diagnosis not present

## 2016-12-01 DIAGNOSIS — Z4932 Encounter for adequacy testing for peritoneal dialysis: Secondary | ICD-10-CM | POA: Diagnosis not present

## 2016-12-02 DIAGNOSIS — N2581 Secondary hyperparathyroidism of renal origin: Secondary | ICD-10-CM | POA: Diagnosis not present

## 2016-12-02 DIAGNOSIS — D513 Other dietary vitamin B12 deficiency anemia: Secondary | ICD-10-CM | POA: Diagnosis not present

## 2016-12-02 DIAGNOSIS — E119 Type 2 diabetes mellitus without complications: Secondary | ICD-10-CM | POA: Diagnosis not present

## 2016-12-02 DIAGNOSIS — D631 Anemia in chronic kidney disease: Secondary | ICD-10-CM | POA: Diagnosis not present

## 2016-12-02 DIAGNOSIS — E440 Moderate protein-calorie malnutrition: Secondary | ICD-10-CM | POA: Diagnosis not present

## 2016-12-02 DIAGNOSIS — Z4932 Encounter for adequacy testing for peritoneal dialysis: Secondary | ICD-10-CM | POA: Diagnosis not present

## 2016-12-02 DIAGNOSIS — E784 Other hyperlipidemia: Secondary | ICD-10-CM | POA: Diagnosis not present

## 2016-12-02 DIAGNOSIS — N2589 Other disorders resulting from impaired renal tubular function: Secondary | ICD-10-CM | POA: Diagnosis not present

## 2016-12-02 DIAGNOSIS — N186 End stage renal disease: Secondary | ICD-10-CM | POA: Diagnosis not present

## 2016-12-02 DIAGNOSIS — D509 Iron deficiency anemia, unspecified: Secondary | ICD-10-CM | POA: Diagnosis not present

## 2016-12-03 DIAGNOSIS — E119 Type 2 diabetes mellitus without complications: Secondary | ICD-10-CM | POA: Diagnosis not present

## 2016-12-03 DIAGNOSIS — N186 End stage renal disease: Secondary | ICD-10-CM | POA: Diagnosis not present

## 2016-12-03 DIAGNOSIS — D513 Other dietary vitamin B12 deficiency anemia: Secondary | ICD-10-CM | POA: Diagnosis not present

## 2016-12-03 DIAGNOSIS — D509 Iron deficiency anemia, unspecified: Secondary | ICD-10-CM | POA: Diagnosis not present

## 2016-12-03 DIAGNOSIS — E784 Other hyperlipidemia: Secondary | ICD-10-CM | POA: Diagnosis not present

## 2016-12-03 DIAGNOSIS — E440 Moderate protein-calorie malnutrition: Secondary | ICD-10-CM | POA: Diagnosis not present

## 2016-12-03 DIAGNOSIS — Z4932 Encounter for adequacy testing for peritoneal dialysis: Secondary | ICD-10-CM | POA: Diagnosis not present

## 2016-12-03 DIAGNOSIS — D631 Anemia in chronic kidney disease: Secondary | ICD-10-CM | POA: Diagnosis not present

## 2016-12-03 DIAGNOSIS — N2589 Other disorders resulting from impaired renal tubular function: Secondary | ICD-10-CM | POA: Diagnosis not present

## 2016-12-03 DIAGNOSIS — N2581 Secondary hyperparathyroidism of renal origin: Secondary | ICD-10-CM | POA: Diagnosis not present

## 2016-12-04 DIAGNOSIS — Z4932 Encounter for adequacy testing for peritoneal dialysis: Secondary | ICD-10-CM | POA: Diagnosis not present

## 2016-12-04 DIAGNOSIS — E440 Moderate protein-calorie malnutrition: Secondary | ICD-10-CM | POA: Diagnosis not present

## 2016-12-04 DIAGNOSIS — N2589 Other disorders resulting from impaired renal tubular function: Secondary | ICD-10-CM | POA: Diagnosis not present

## 2016-12-04 DIAGNOSIS — E119 Type 2 diabetes mellitus without complications: Secondary | ICD-10-CM | POA: Diagnosis not present

## 2016-12-04 DIAGNOSIS — N2581 Secondary hyperparathyroidism of renal origin: Secondary | ICD-10-CM | POA: Diagnosis not present

## 2016-12-04 DIAGNOSIS — D509 Iron deficiency anemia, unspecified: Secondary | ICD-10-CM | POA: Diagnosis not present

## 2016-12-04 DIAGNOSIS — N186 End stage renal disease: Secondary | ICD-10-CM | POA: Diagnosis not present

## 2016-12-04 DIAGNOSIS — E784 Other hyperlipidemia: Secondary | ICD-10-CM | POA: Diagnosis not present

## 2016-12-04 DIAGNOSIS — D631 Anemia in chronic kidney disease: Secondary | ICD-10-CM | POA: Diagnosis not present

## 2016-12-04 DIAGNOSIS — D513 Other dietary vitamin B12 deficiency anemia: Secondary | ICD-10-CM | POA: Diagnosis not present

## 2016-12-05 DIAGNOSIS — N186 End stage renal disease: Secondary | ICD-10-CM | POA: Diagnosis not present

## 2016-12-05 DIAGNOSIS — E119 Type 2 diabetes mellitus without complications: Secondary | ICD-10-CM | POA: Diagnosis not present

## 2016-12-05 DIAGNOSIS — N2589 Other disorders resulting from impaired renal tubular function: Secondary | ICD-10-CM | POA: Diagnosis not present

## 2016-12-05 DIAGNOSIS — Z4932 Encounter for adequacy testing for peritoneal dialysis: Secondary | ICD-10-CM | POA: Diagnosis not present

## 2016-12-05 DIAGNOSIS — D513 Other dietary vitamin B12 deficiency anemia: Secondary | ICD-10-CM | POA: Diagnosis not present

## 2016-12-05 DIAGNOSIS — D509 Iron deficiency anemia, unspecified: Secondary | ICD-10-CM | POA: Diagnosis not present

## 2016-12-05 DIAGNOSIS — D631 Anemia in chronic kidney disease: Secondary | ICD-10-CM | POA: Diagnosis not present

## 2016-12-05 DIAGNOSIS — E440 Moderate protein-calorie malnutrition: Secondary | ICD-10-CM | POA: Diagnosis not present

## 2016-12-05 DIAGNOSIS — N2581 Secondary hyperparathyroidism of renal origin: Secondary | ICD-10-CM | POA: Diagnosis not present

## 2016-12-05 DIAGNOSIS — E784 Other hyperlipidemia: Secondary | ICD-10-CM | POA: Diagnosis not present

## 2016-12-06 DIAGNOSIS — N2589 Other disorders resulting from impaired renal tubular function: Secondary | ICD-10-CM | POA: Diagnosis not present

## 2016-12-06 DIAGNOSIS — D509 Iron deficiency anemia, unspecified: Secondary | ICD-10-CM | POA: Diagnosis not present

## 2016-12-06 DIAGNOSIS — E784 Other hyperlipidemia: Secondary | ICD-10-CM | POA: Diagnosis not present

## 2016-12-06 DIAGNOSIS — D513 Other dietary vitamin B12 deficiency anemia: Secondary | ICD-10-CM | POA: Diagnosis not present

## 2016-12-06 DIAGNOSIS — N2581 Secondary hyperparathyroidism of renal origin: Secondary | ICD-10-CM | POA: Diagnosis not present

## 2016-12-06 DIAGNOSIS — E440 Moderate protein-calorie malnutrition: Secondary | ICD-10-CM | POA: Diagnosis not present

## 2016-12-06 DIAGNOSIS — D631 Anemia in chronic kidney disease: Secondary | ICD-10-CM | POA: Diagnosis not present

## 2016-12-06 DIAGNOSIS — N186 End stage renal disease: Secondary | ICD-10-CM | POA: Diagnosis not present

## 2016-12-06 DIAGNOSIS — Z4932 Encounter for adequacy testing for peritoneal dialysis: Secondary | ICD-10-CM | POA: Diagnosis not present

## 2016-12-06 DIAGNOSIS — E119 Type 2 diabetes mellitus without complications: Secondary | ICD-10-CM | POA: Diagnosis not present

## 2016-12-07 DIAGNOSIS — D631 Anemia in chronic kidney disease: Secondary | ICD-10-CM | POA: Diagnosis not present

## 2016-12-07 DIAGNOSIS — N2589 Other disorders resulting from impaired renal tubular function: Secondary | ICD-10-CM | POA: Diagnosis not present

## 2016-12-07 DIAGNOSIS — E784 Other hyperlipidemia: Secondary | ICD-10-CM | POA: Diagnosis not present

## 2016-12-07 DIAGNOSIS — D509 Iron deficiency anemia, unspecified: Secondary | ICD-10-CM | POA: Diagnosis not present

## 2016-12-07 DIAGNOSIS — N186 End stage renal disease: Secondary | ICD-10-CM | POA: Diagnosis not present

## 2016-12-07 DIAGNOSIS — E119 Type 2 diabetes mellitus without complications: Secondary | ICD-10-CM | POA: Diagnosis not present

## 2016-12-07 DIAGNOSIS — D513 Other dietary vitamin B12 deficiency anemia: Secondary | ICD-10-CM | POA: Diagnosis not present

## 2016-12-07 DIAGNOSIS — N2581 Secondary hyperparathyroidism of renal origin: Secondary | ICD-10-CM | POA: Diagnosis not present

## 2016-12-07 DIAGNOSIS — E440 Moderate protein-calorie malnutrition: Secondary | ICD-10-CM | POA: Diagnosis not present

## 2016-12-07 DIAGNOSIS — Z4932 Encounter for adequacy testing for peritoneal dialysis: Secondary | ICD-10-CM | POA: Diagnosis not present

## 2016-12-07 NOTE — Progress Notes (Signed)
[facility] Unk Pinto, M.D.    Uvaldo Bristle. Silverio Lay, P.A.-C      Starlyn Skeans, P.A.-C  [facility]                [address] [place] [address] Telephone [phone] Telefax [phone]  Annual Screening/Preventative Visit & Comprehensive Evaluation &  Examination     This very nice 76 y.o. DBF presents for a Screening/Preventative Visit & comprehensive evaluation and management of multiple medical co-morbidities.  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      HTN predates since the 1990's. Patient's BP has been controlled at home and patient denies any cardiac symptoms as chest pain, palpitations, shortness of breath, dizziness or ankle swelling. Today's BP is elevated at 158 /74 and rechecked at 144/80. Patient notes that she had Amlodipine d/c'd last spring and had Clonidine d/c'd since June OV.       Patient's hyperlipidemia is controlled with diet and medications. Patient denies myalgias or other medication SE's. Last lipids were at goal albeit elevated Trig's: Lab Results  Component Value Date   CHOL 195 08/25/2016   HDL 62 08/25/2016   LDLCALC 89 08/25/2016   TRIG 221 (H) 08/25/2016   CHOLHDL 3.1 08/25/2016      Patient has T2_NIDDM (A1c 6.6%) controlled with diet since 1999 and has consequent ESRD and is on home peritoneal dialysis since March 2017 followed closely By Dr Raymondo Band and patient denies reactive hypoglycemic symptoms, visual blurring, diabetic polys, or paresthesias. Last A1c was at goal altho patient has noted 8# weight gain over the last 6 months.  Lab Results  Component Value Date   HGBA1C 5.6 08/25/2016      Finally, patient has history of Vitamin D Deficiency with "79" in 2014, "31" in Dec 2016 and last Vitamin D was still very low: Lab Results  Component Value Date   VD25OH 32 05/19/2016    Current Outpatient Prescriptions on File Prior to Visit  Medication Sig  . allopurinol (ZYLOPRIM) 300 MG tablet Take 1/2 to 1 tablet by  mouth daily as directed  . aspirin 81 MG tablet Take 81 mg by mouth daily.  Marland Kitchen b complex-vitamin c-folic acid (NEPHRO-VITE) 0.8 MG TABS tablet Take 1 tablet by mouth at bedtime.  Marland Kitchen CALCIUM PO Take by mouth.  . Cholecalciferol (VITAMIN D-3 PO) Take 5,000 Units by mouth once a week.   . cinacalcet (SENSIPAR) 30 MG tablet Take 30 mg by mouth daily.  Marland Kitchen docusate sodium (COLACE) 100 MG capsule Take 100 mg by mouth 2 (two) times daily.  . Ferrous Sulfate (SLOW FE PO) Take 45 mg by mouth 2 (two) times daily.  . Flaxseed, Linseed, (FLAXSEED OIL PO) Take 1 tablet by mouth daily.  Marland Kitchen glucose blood (ACCU-CHEK AVIVA PLUS) test strip Test once daily  . hydrALAZINE (APRESOLINE) 10 MG tablet TAKE 2 TABLETS BY MOUTH 2  TIMES DAILY.  Marland Kitchen labetalol (NORMODYNE) 200 MG tablet TAKE 1 TABLET BY MOUTH TWO  TIMES DAILY  . Multiple Vitamin (MULTIVITAMIN WITH MINERALS) TABS tablet Take 1 tablet by mouth daily.  . Omega-3 Fatty Acids (FISH OIL) 1200 MG CPDR Takes 1 capsule 2 x /daily  . polyethylene glycol (MIRALAX / GLYCOLAX) packet Take 17 g by mouth daily.  . sevelamer carbonate (RENVELA) 800 MG tablet Take 800 mg by  mouth 3 (three) times daily with meals.  . zinc gluconate 50 MG tablet Take 50 mg by mouth every morning.  . [DISCONTINUED] diltiazem (CARDIZEM) 120 MG tablet Take 120 mg by mouth 2 (two) times daily.   Allergies  Allergen Reactions  . Ace Inhibitors     unknown  . Nsaids     unknown   Past Medical History:  Diagnosis Date  . Anemia   . Arthritis    Osteoarthritis  . Chronic kidney disease    Chronic renal insufficiency  . Diabetes mellitus    Pre  . Diverticulosis   . GERD (gastroesophageal reflux disease)   . Gout   . Hiatal hernia   . History of blood transfusion   . Hyperlipidemia   . Hypertension   . Osteopenia   . Pancreatic cyst 1999  .  Thyroid disease    Hyperparathyroid    Health Maintenance  Topic Date Due  . PNA vac Low Risk Adult (2 of 2 - PCV13) 11/20/2010  . TETANUS/TDAP  11/20/2013  . INFLUENZA VACCINE  06/20/2016  . FOOT EXAM  10/28/2016  . URINE MICROALBUMIN  10/28/2016  . OPHTHALMOLOGY EXAM  01/23/2017  . HEMOGLOBIN A1C  02/23/2017  . DEXA SCAN  Completed  . ZOSTAVAX  Completed   Immunization History  Administered Date(s) Administered  . DT 02/05/2014  . Influenza, High Dose Seasonal PF 09/25/2013  . Influenza-Unspecified 08/11/2015  . Pneumococcal Polysaccharide-23 11/20/2009  . Td 11/21/2003  . Zoster 11/20/2005   Past Surgical History:  Procedure Laterality Date  . CHOLECYSTECTOMY    . COLONOSCOPY  03/21/12   Next one in 2018  . EYE SURGERY Left    cataract extraction with IOL  . JOINT REPLACEMENT  2012   left knee  . left knee replacement     . PANCREATIC CYST EXCISION  1999  . TOTAL HIP ARTHROPLASTY Right 05/26/2015   Procedure: RIGHT TOTAL HIP ARTHROPLASTY ANTERIOR APPROACH;  Surgeon: Gaynelle Arabian, MD;  Location: [facility];  Service: Orthopedics;  Laterality: Right;  . TOTAL KNEE ARTHROPLASTY Right 06/09/2013   Procedure: RIGHT TOTAL KNEE ARTHROPLASTY;  Surgeon: Gearlean Alf, MD;  Location: [facility];  Service: Orthopedics;  Laterality: Right;   Family History  Problem Relation Age of Onset  . Cancer Father     Prostate and Throat  . Hypertension Mother   . Cancer Mother     Colon with METS  . Diabetes Mother    Social History  Substance Use Topics  . Smoking status: Never Smoker  . Smokeless tobacco: Never Used  . Alcohol use No    ROS Constitutional: Denies fever, chills, weight loss/gain, headaches, insomnia,  night sweats, and change in appetite. Does c/o fatigue. Eyes: Denies redness, blurred vision, diplopia, discharge, itchy, watery eyes.  ENT: Denies discharge, congestion, post nasal drip, epistaxis, sore throat, earache, hearing loss, dental pain, Tinnitus, Vertigo, Sinus  pain, snoring.  Cardio: Denies chest pain, palpitations, irregular heartbeat, syncope, dyspnea, diaphoresis, orthopnea, PND, claudication, edema Respiratory: denies cough, dyspnea, DOE, pleurisy, hoarseness, laryngitis, wheezing.  Gastrointestinal: Denies dysphagia, heartburn, reflux, water brash, pain, cramps, nausea, vomiting, bloating, diarrhea, constipation, hematemesis, melena, hematochezia, jaundice, hemorrhoids Genitourinary: Denies dysuria, frequency, urgency, nocturia, hesitancy, discharge, hematuria, flank pain Breast: Breast lumps, nipple discharge, bleeding.  Musculoskeletal: Denies arthralgia, myalgia, stiffness, Jt. Swelling, pain, limp, and strain/sprain. Denies falls. Skin: Denies puritis, rash, hives, warts, acne, eczema, changing in skin lesion Neuro: No weakness, tremor, incoordination, spasms, paresthesia, pain Psychiatric: Denies confusion, memory  loss, sensory loss. Denies Depression. Endocrine: Denies change in weight, skin, hair change, nocturia, and paresthesia, diabetic polys, visual blurring, hyper / hypo glycemic episodes.  Heme/Lymph: No excessive bleeding, bruising, enlarged lymph nodes.  Physical Exam  BP (!) 158/74   Pulse 74   Temp 98.2 F (36.8 C) (Temporal)   Resp 16   Ht 5\' 1"  (1.549 m)   Wt 152 lb (68.9 kg)   BMI 28.72 kg/m   General Appearance: Well nourished and in no apparent distress.  Eyes: PERRLA, EOMs, conjunctiva no swelling or erythema, normal fundi and vessels. Sinuses: No frontal/maxillary tenderness ENT/Mouth: EACs patent / TMs  nl. Nares clear without erythema, swelling, mucoid exudates. Oral hygiene is good. No erythema, swelling, or exudate. Tongue normal, non-obstructing. Tonsils not swollen or erythematous. Hearing normal.  Neck: Supple, thyroid normal. No bruits, nodes or JVD. Respiratory: Respiratory effort normal.  BS equal and clear bilateral without rales, rhonci, wheezing or stridor. Cardio: Heart sounds are normal with  regular rate and rhythm and no murmurs, rubs or gallops. Peripheral pulses are normal and equal bilaterally without edema. No aortic or femoral bruits. Chest: symmetric with normal excursions and percussion. Breasts: Symmetric, without lumps, nipple discharge, retractions, or fibrocystic changes.  Abdomen: Flat, soft with bowel sounds active. Nontender, no guarding, rebound, hernias, masses, or organomegaly.  Lymphatics: Non tender without lymphadenopathy.  Genitourinary:  Musculoskeletal: Full ROM all peripheral extremities, joint stability, 5/5 strength, and normal gait. Skin: Warm and dry without rashes, lesions, cyanosis, clubbing or  ecchymosis.  Neuro: Cranial nerves intact, reflexes equal bilaterally. Normal muscle tone, no cerebellar symptoms. Sensation intact.  Pysch: Alert and oriented X 3, normal affect, Insight and Judgment appropriate.   Assessment and Plan  1. Annual Preventative Screening Examination  2. Essential hypertension  - EKG 12-Lead - CBC with Differential/Platelet - BASIC METABOLIC PANEL WITH GFR - TSH  3. Hyperlipidemia  - EKG 12-Lead - Hepatic function panel - Lipid panel - TSH  4. Type 2 diabetes mellitus with chronic kidney disease on chronic dialysis, without long-term current use of insulin (HCC)  - EKG 12-Lead - HM DIABETES FOOT EXAM - LOW EXTREMITY NEUR EXAM DOCUM - Hemoglobin A1c - Insulin, random  5. Vitamin D deficiency  - VITAMIN D 25                                                 n)   7. Idiopathic gout, unspecified chronicity, unspecified site  - Uric acid  8. GERD   9. Anemia of chronic Renal Dz   10. Screening for rectal cancer  - POC Hemoccult Bld/Stl   12. Screening for ischemic heart disease  - EKG 12-Lead  13. Medication management  - BASIC METABOLIC PANEL WITH GFR - Hepatic function panel - Magnesium - VITAMIN D 25 Hydroxy        Continue prudent diet as discussed, weight control, BP monitoring, regular  exercise, and medications. Discussed med's effects and SE's. Screening labs and tests as requested with regular follow-up as recommended. Over 40 minutes of exam, counseling, chart review and high complex critical decision making was performed.

## 2016-12-07 NOTE — Patient Instructions (Signed)
,Preventive Care for Adults  A healthy lifestyle and preventive care can promote health and wellness. Preventive health guidelines for women include the following key practices.  A routine yearly physical is a good way to check with your health care provider about your health and preventive screening. It is a chance to share any concerns and updates on your health and to receive a thorough exam.  Visit your dentist for a routine exam and preventive care every 6 months. Brush your teeth twice a day and floss once a day. Good oral hygiene prevents tooth decay and gum disease.  The frequency of eye exams is based on your age, health, family medical history, use of contact lenses, and other factors. Follow your health care provider's recommendations for frequency of eye exams.  Eat a healthy diet. Foods like vegetables, fruits, whole grains, low-fat dairy products, and lean protein foods contain the nutrients you need without too many calories. Decrease your intake of foods high in solid fats, added sugars, and salt. Eat the right amount of calories for you.Get information about a proper diet from your health care provider, if necessary.  Regular physical exercise is one of the most important things you can do for your health. Most adults should get at least 150 minutes of moderate-intensity exercise (any activity that increases your heart rate and causes you to sweat) each week. In addition, most adults need muscle-strengthening exercises on 2 or more days a week.  Maintain a healthy weight. The body mass index (BMI) is a screening tool to identify possible weight problems. It provides an estimate of body fat based on height and weight. Your health care provider can find your BMI and can help you achieve or maintain a healthy weight.For adults 20 years and older:  A BMI below 18.5 is considered underweight.  A BMI of 18.5 to 24.9 is normal.  A BMI of 25 to 29.9 is considered overweight.  A BMI  of 30 and above is considered obese.  Maintain normal blood lipids and cholesterol levels by exercising and minimizing your intake of saturated fat. Eat a balanced diet with plenty of fruit and vegetables. If your lipid or cholesterol levels are high, you are over 50, or you are at high risk for heart disease, you may need your cholesterol levels checked more frequently.Ongoing high lipid and cholesterol levels should be treated with medicines if diet and exercise are not working.  If you smoke, find out from your health care provider how to quit. If you do not use tobacco, do not start.  Lung cancer screening is recommended for adults aged 66-80 years who are at high risk for developing lung cancer because of a history of smoking. A yearly low-dose CT scan of the lungs is recommended for people who have at least a 30-pack-year history of smoking and are a current smoker or have quit within the past 15 years. A pack year of smoking is smoking an average of 1 pack of cigarettes a day for 1 year (for example: 1 pack a day for 30 years or 2 packs a day for 15 years). Yearly screening should continue until the smoker has stopped smoking for at least 15 years. Yearly screening should be stopped for people who develop a health problem that would prevent them from having lung cancer treatment.  Avoid use of street drugs. Do not share needles with anyone. Ask for help if you need support or instructions about stopping the use of drugs.  High  blood pressure causes heart disease and increases the risk of stroke.  Ongoing high blood pressure should be treated with medicines if weight loss and exercise do not work.  If you are 55-79 years old, ask your health care provider if you should take aspirin to prevent strokes.  Diabetes screening involves taking a blood sample to check your fasting blood sugar level. This should be done once every 3 years, after age 45, if you are within normal weight and without risk  factors for diabetes. Testing should be considered at a younger age or be carried out more frequently if you are overweight and have at least 1 risk factor for diabetes.  Breast cancer screening is essential preventive care for women. You should practice "breast self-awareness." This means understanding the normal appearance and feel of your breasts and may include breast self-examination. Any changes detected, no matter how small, should be reported to a health care provider. Women in their 20s and 30s should have a clinical breast exam (CBE) by a health care provider as part of a regular health exam every 1 to 3 years. After age 40, women should have a CBE every year. Starting at age 40, women should consider having a mammogram (breast X-ray test) every year. Women who have a family history of breast cancer should talk to their health care provider about genetic screening. Women at a high risk of breast cancer should talk to their health care providers about having an MRI and a mammogram every year.  Breast cancer gene (BRCA)-related cancer risk assessment is recommended for women who have family members with BRCA-related cancers. BRCA-related cancers include breast, ovarian, tubal, and peritoneal cancers. Having family members with these cancers may be associated with an increased risk for harmful changes (mutations) in the breast cancer genes BRCA1 and BRCA2. Results of the assessment will determine the need for genetic counseling and BRCA1 and BRCA2 testing.  Routine pelvic exams to screen for cancer are no longer recommended for nonpregnant women who are considered low risk for cancer of the pelvic organs (ovaries, uterus, and vagina) and who do not have symptoms. Ask your health care provider if a screening pelvic exam is right for you.  If you have had past treatment for cervical cancer or a condition that could lead to cancer, you need Pap tests and screening for cancer for at least 20 years after  your treatment. If Pap tests have been discontinued, your risk factors (such as having a new sexual partner) need to be reassessed to determine if screening should be resumed. Some women have medical problems that increase the chance of getting cervical cancer. In these cases, your health care provider may recommend more frequent screening and Pap tests.    Colorectal cancer can be detected and often prevented. Most routine colorectal cancer screening begins at the age of 50 years and continues through age 75 years. However, your health care provider may recommend screening at an earlier age if you have risk factors for colon cancer. On a yearly basis, your health care provider may provide home test kits to check for hidden blood in the stool. Use of a small camera at the end of a tube, to directly examine the colon (sigmoidoscopy or colonoscopy), can detect the earliest forms of colorectal cancer. Talk to your health care provider about this at age 50, when routine screening begins. Direct exam of the colon should be repeated every 5-10 years through age 75 years, unless early forms of pre-cancerous   polyps or small growths are found.  Osteoporosis is a disease in which the bones lose minerals and strength with aging. This can result in serious bone fractures or breaks. The risk of osteoporosis can be identified using a bone density scan. Women ages 68 years and over and women at risk for fractures or osteoporosis should discuss screening with their health care providers. Ask your health care provider whether you should take a calcium supplement or vitamin D to reduce the rate of osteoporosis.  Menopause can be associated with physical symptoms and risks. Hormone replacement therapy is available to decrease symptoms and risks. You should talk to your health care provider about whether hormone replacement therapy is right for you.  Use sunscreen. Apply sunscreen liberally and repeatedly throughout the day.  You should seek shade when your shadow is shorter than you. Protect yourself by wearing long sleeves, pants, a wide-brimmed hat, and sunglasses year round, whenever you are outdoors.  Once a month, do a whole body skin exam, using a mirror to look at the skin on your back. Tell your health care provider of new moles, moles that have irregular borders, moles that are larger than a pencil eraser, or moles that have changed in shape or color.  Stay current with required vaccines (immunizations).  Influenza vaccine. All adults should be immunized every year.  Tetanus, diphtheria, and acellular pertussis (Td, Tdap) vaccine. Pregnant women should receive 1 dose of Tdap vaccine during each pregnancy. The dose should be obtained regardless of the length of time since the last dose. Immunization is preferred during the 27th-36th week of gestation. An adult who has not previously received Tdap or who does not know her vaccine status should receive 1 dose of Tdap. This initial dose should be followed by tetanus and diphtheria toxoids (Td) booster doses every 10 years. Adults with an unknown or incomplete history of completing a 3-dose immunization series with Td-containing vaccines should begin or complete a primary immunization series including a Tdap dose. Adults should receive a Td booster every 10 years.    Zoster vaccine. One dose is recommended for adults aged 36 years or older unless certain conditions are present.    Pneumococcal 13-valent conjugate (PCV13) vaccine. When indicated, a person who is uncertain of her immunization history and has no record of immunization should receive the PCV13 vaccine. An adult aged 1 years or older who has certain medical conditions and has not been previously immunized should receive 1 dose of PCV13 vaccine. This PCV13 should be followed with a dose of pneumococcal polysaccharide (PPSV23) vaccine. The PPSV23 vaccine dose should be obtained at least 8 weeks after the  dose of PCV13 vaccine. An adult aged 73 years or older who has certain medical conditions and previously received 1 or more doses of PPSV23 vaccine should receive 1 dose of PCV13. The PCV13 vaccine dose should be obtained 1 or more years after the last PPSV23 vaccine dose.    Pneumococcal polysaccharide (PPSV23) vaccine. When PCV13 is also indicated, PCV13 should be obtained first. All adults aged 47 years and older should be immunized. An adult younger than age 86 years who has certain medical conditions should be immunized. Any person who resides in a nursing home or long-term care facility should be immunized. An adult smoker should be immunized. People with an immunocompromised condition and certain other conditions should receive both PCV13 and PPSV23 vaccines. People with human immunodeficiency virus (HIV) infection should be immunized as soon as possible after diagnosis. Immunization  during chemotherapy or radiation therapy should be avoided. Routine use of PPSV23 vaccine is not recommended for American Indians, [place]atives, or people younger than 65 years unless there are medical conditions that require PPSV23 vaccine. When indicated, people who have unknown immunization and have no record of immunization should receive PPSV23 vaccine. One-time revaccination 5 years after the first dose of PPSV23 is recommended for people aged 19-64 years who have chronic kidney failure, nephrotic syndrome, asplenia, or immunocompromised conditions. People who received 1-2 doses of PPSV23 before age 49 years should receive another dose of PPSV23 vaccine at age 71 years or later if at least 5 years have passed since the previous dose. Doses of PPSV23 are not needed for people immunized with PPSV23 at or after age 23 years.   Preventive Services / Frequency  Ages 9 years and over  Blood pressure check.  Lipid and cholesterol check.  Lung cancer screening. / Every year if you are aged 70-80 years and have a  30-pack-year history of smoking and currently smoke or have quit within the past 15 years. Yearly screening is stopped once you have quit smoking for at least 15 years or develop a health problem that would prevent you from having lung cancer treatment.  Clinical breast exam.** / Every year after age 46 years.  BRCA-related cancer risk assessment.** / For women who have family members with a BRCA-related cancer (breast, ovarian, tubal, or peritoneal cancers).  Mammogram.** / Every year beginning at age 38 years and continuing for as long as you are in good health. Consult with your health care provider.  Pap test.** / Every 3 years starting at age 33 years through age 29 or 46 years with 3 consecutive normal Pap tests. Testing can be stopped between 65 and 70 years with 3 consecutive normal Pap tests and no abnormal Pap or HPV tests in the past 10 years.  Fecal occult blood test (FOBT) of stool. / Every year beginning at age 55 years and continuing until age 57 years. You may not need to do this test if you get a colonoscopy every 10 years.  Flexible sigmoidoscopy or colonoscopy.** / Every 5 years for a flexible sigmoidoscopy or every 10 years for a colonoscopy beginning at age 43 years and continuing until age 13 years.  Hepatitis C blood test.** / For all people born from [birth year] through 1966 and any individual with known risks for hepatitis C.  Osteoporosis screening.** / A one-time screening for women ages 55 years and over and women at risk for fractures or osteoporosis.  Skin self-exam. / Monthly.  Influenza vaccine. / Every year.  Tetanus, diphtheria, and acellular pertussis (Tdap/Td) vaccine.** / 1 dose of Td every 10 years.  Zoster vaccine.** / 1 dose for adults aged 53 years or older.  Pneumococcal 13-valent conjugate (PCV13) vaccine.** / Consult your health care provider.  Pneumococcal polysaccharide (PPSV23) vaccine.** / 1 dose for all adults aged 54 years and older. Screening  for abdominal aortic aneurysm (AAA)  by ultrasound is recommended for people who have history of high blood pressure or who are current or former smokers. ++++++++++++++++++++ Recommend Adult Low Dose Aspirin or  coated  Aspirin 81 mg daily  To reduce risk of Colon Cancer 20 %,  Skin Cancer 26 % ,  Melanoma 46%  and  Pancreatic cancer 60% ++++++++++++++++++++ Vitamin D goal  is between 70-100.  Please make sure that you are taking your Vitamin D as directed.  It is very important as  a natural anti-inflammatory  helping hair, skin, and nails, as well as reducing stroke and heart attack risk.  It helps your bones and helps with mood. It also decreases numerous cancer risks so please take it as directed.  Low Vit D is associated with a 200-300% higher risk for CANCER  and 200-300% higher risk for HEART   ATTACK  &  STROKE.   .....................................Marland Kitchen It is also associated with higher death rate at younger ages,  autoimmune diseases like Rheumatoid arthritis, Lupus, Multiple Sclerosis.    Also many other serious conditions, like depression, Alzheimer's Dementia, infertility, muscle aches, fatigue, fibromyalgia - just to name a few. ++++++++++++++++++ Recommend the book "The END of DIETING" by Dr Excell Seltzer  & the book "The END of DIABETES " by Dr Excell Seltzer At [email or web address] - get book & Audio CD's    Being diabetic has a  300% increased risk for heart attack, stroke, cancer, and alzheimer- type vascular dementia. It is very important that you work harder with diet by avoiding all foods that are white. Avoid white rice (brown & wild rice is OK), white potatoes (sweetpotatoes in moderation is OK), White bread or wheat bread or anything made out of white flour like bagels, donuts, rolls, buns, biscuits, cakes, pastries, cookies, pizza crust, and pasta (made from white flour & egg whites) - vegetarian pasta or spinach or wheat pasta is OK. Multigrain breads like Arnold's or  [employer], or multigrain sandwich thins or flatbreads.  Diet, exercise and weight loss can reverse and cure diabetes in the early stages.  Diet, exercise and weight loss is very important in the control and prevention of complications of diabetes which affects every system in your body, ie. Brain - dementia/stroke, eyes - glaucoma/blindness, heart - heart attack/heart failure, kidneys - dialysis, stomach - gastric paralysis, intestines - malabsorption, nerves - severe painful neuritis, circulation - gangrene & loss of a leg(s), and finally cancer and Alzheimers.    I recommend avoid fried & greasy foods,  sweets/candy, white rice (brown or wild rice or Quinoa is OK), white potatoes (sweet potatoes are OK) - anything made from white flour - bagels, doughnuts, rolls, buns, biscuits,white and wheat breads, pizza crust and traditional pasta made of white flour & egg white(vegetarian pasta or spinach or wheat pasta is OK).  Multi-grain bread is OK - like multi-grain flat bread or sandwich thins. Avoid alcohol in excess. Exercise is also important.    Eat all the vegetables you want - avoid meat, especially red meat and dairy - especially cheese.  Cheese is the most concentrated form of trans-fats which is the worst thing to clog up our arteries. Veggie cheese is OK which can be found in the fresh produce section at [employer] or [employer] or [employer]  +++++++++++++++++++ DASH Eating Plan  DASH stands for "Dietary Approaches to Stop Hypertension."   The DASH eating plan is a healthy eating plan that has been shown to reduce high blood pressure (hypertension). Additional health benefits may include reducing the risk of type 2 diabetes mellitus, heart disease, and stroke. The DASH eating plan may also help with weight loss. WHAT DO I NEED TO KNOW ABOUT THE DASH EATING PLAN? For the DASH eating plan, you will follow these general guidelines:  Choose foods with a percent daily value for sodium of  less than 5% (as listed on the food label).  Use salt-free seasonings or herbs instead of table salt or sea salt.  Check with your  health care provider or pharmacist before using salt substitutes.  Eat lower-sodium products, often labeled as "lower sodium" or "no salt added."  Eat fresh foods.  Eat more vegetables, fruits, and low-fat dairy products.  Choose whole grains. Look for the word "whole" as the first word in the ingredient list.  Choose fish   Limit sweets, desserts, sugars, and sugary drinks.  Choose heart-healthy fats.  Eat veggie cheese   Eat more home-cooked food and less restaurant, buffet, and fast food.  Limit fried foods.  Cook foods using methods other than frying.  Limit canned vegetables. If you do use them, rinse them well to decrease the sodium.  When eating at a restaurant, ask that your food be prepared with less salt, or no salt if possible.                      WHAT FOODS CAN I EAT? Read Dr Joel Fuhrman's books on The End of Dieting & The End of Diabetes  Grains Whole grain or whole wheat bread. Brown rice. Whole grain or whole wheat pasta. Quinoa, bulgur, and whole grain cereals. Low-sodium cereals. Corn or whole wheat flour tortillas. Whole grain cornbread. Whole grain crackers. Low-sodium crackers.  Vegetables Fresh or frozen vegetables (raw, steamed, roasted, or grilled). Low-sodium or reduced-sodium tomato and vegetable juices. Low-sodium or reduced-sodium tomato sauce and paste. Low-sodium or reduced-sodium canned vegetables.   Fruits All fresh, canned (in natural juice), or frozen fruits.  Protein Products  All fish and seafood.  Dried beans, peas, or lentils. Unsalted nuts and seeds. Unsalted canned beans.  Dairy Low-fat dairy products, such as skim or 1% milk, 2% or reduced-fat cheeses, low-fat ricotta or cottage cheese, or plain low-fat yogurt. Low-sodium or reduced-sodium cheeses.  Fats and Oils Tub margarines without trans  fats. Light or reduced-fat mayonnaise and salad dressings (reduced sodium). Avocado. Safflower, olive, or canola oils. Natural peanut or almond butter.  Other Unsalted popcorn and pretzels. The items listed above may not be a complete list of recommended foods or beverages. Contact your dietitian for more options.  +++++++++++++++  WHAT FOODS ARE NOT RECOMMENDED? Grains/ White flour or wheat flour White bread. White pasta. White rice. Refined cornbread. Bagels and croissants. Crackers that contain trans fat.  Vegetables  Creamed or fried vegetables. Vegetables in a . Regular canned vegetables. Regular canned tomato sauce and paste. Regular tomato and vegetable juices.  Fruits Dried fruits. Canned fruit in light or heavy syrup. Fruit juice.  Meat and Other Protein Products Meat in general - RED meat & White meat.  Fatty cuts of meat. Ribs, chicken wings, all processed meats as bacon, sausage, bologna, salami, fatback, hot dogs, bratwurst and packaged luncheon meats.  Dairy Whole or 2% milk, cream, half-and-half, and cream cheese. Whole-fat or sweetened yogurt. Full-fat cheeses or blue cheese. Non-dairy creamers and whipped toppings. Processed cheese, cheese spreads, or cheese curds.  Condiments Onion and garlic salt, seasoned salt, table salt, and sea salt. Canned and packaged gravies. Worcestershire sauce. Tartar sauce. Barbecue sauce. Teriyaki sauce. Soy sauce, including reduced sodium. Steak sauce. Fish sauce. Oyster sauce. Cocktail sauce. Horseradish. Ketchup and mustard. Meat flavorings and tenderizers. Bouillon cubes. Hot sauce. Tabasco sauce. Marinades. Taco seasonings. Relishes.  Fats and Oils Butter, stick margarine, lard, shortening and bacon fat. Coconut, palm kernel, or palm oils. Regular salad dressings.  Pickles and olives. Salted popcorn and pretzels.  The items listed above may not be a complete list of foods and beverages to   avoid.

## 2016-12-08 ENCOUNTER — Ambulatory Visit (INDEPENDENT_AMBULATORY_CARE_PROVIDER_SITE_OTHER): Payer: Medicare Other | Admitting: Internal Medicine

## 2016-12-08 ENCOUNTER — Other Ambulatory Visit: Payer: Self-pay | Admitting: Internal Medicine

## 2016-12-08 ENCOUNTER — Encounter: Payer: Self-pay | Admitting: Internal Medicine

## 2016-12-08 VITALS — BP 158/74 | HR 74 | Temp 98.2°F | Resp 16 | Ht 61.0 in | Wt 152.0 lb

## 2016-12-08 DIAGNOSIS — E784 Other hyperlipidemia: Secondary | ICD-10-CM | POA: Diagnosis not present

## 2016-12-08 DIAGNOSIS — I10 Essential (primary) hypertension: Secondary | ICD-10-CM | POA: Diagnosis not present

## 2016-12-08 DIAGNOSIS — D638 Anemia in other chronic diseases classified elsewhere: Secondary | ICD-10-CM

## 2016-12-08 DIAGNOSIS — E559 Vitamin D deficiency, unspecified: Secondary | ICD-10-CM | POA: Diagnosis not present

## 2016-12-08 DIAGNOSIS — N186 End stage renal disease: Secondary | ICD-10-CM | POA: Diagnosis not present

## 2016-12-08 DIAGNOSIS — Z992 Dependence on renal dialysis: Secondary | ICD-10-CM

## 2016-12-08 DIAGNOSIS — Z79899 Other long term (current) drug therapy: Secondary | ICD-10-CM

## 2016-12-08 DIAGNOSIS — Z0000 Encounter for general adult medical examination without abnormal findings: Secondary | ICD-10-CM | POA: Diagnosis not present

## 2016-12-08 DIAGNOSIS — E119 Type 2 diabetes mellitus without complications: Secondary | ICD-10-CM | POA: Diagnosis not present

## 2016-12-08 DIAGNOSIS — K2100 Gastro-esophageal reflux disease with esophagitis, without bleeding: Secondary | ICD-10-CM

## 2016-12-08 DIAGNOSIS — N2589 Other disorders resulting from impaired renal tubular function: Secondary | ICD-10-CM | POA: Diagnosis not present

## 2016-12-08 DIAGNOSIS — E1122 Type 2 diabetes mellitus with diabetic chronic kidney disease: Secondary | ICD-10-CM | POA: Diagnosis not present

## 2016-12-08 DIAGNOSIS — Z136 Encounter for screening for cardiovascular disorders: Secondary | ICD-10-CM

## 2016-12-08 DIAGNOSIS — E440 Moderate protein-calorie malnutrition: Secondary | ICD-10-CM | POA: Diagnosis not present

## 2016-12-08 DIAGNOSIS — M1000 Idiopathic gout, unspecified site: Secondary | ICD-10-CM

## 2016-12-08 DIAGNOSIS — D509 Iron deficiency anemia, unspecified: Secondary | ICD-10-CM | POA: Diagnosis not present

## 2016-12-08 DIAGNOSIS — Z4932 Encounter for adequacy testing for peritoneal dialysis: Secondary | ICD-10-CM | POA: Diagnosis not present

## 2016-12-08 DIAGNOSIS — Z0001 Encounter for general adult medical examination with abnormal findings: Secondary | ICD-10-CM | POA: Diagnosis not present

## 2016-12-08 DIAGNOSIS — D513 Other dietary vitamin B12 deficiency anemia: Secondary | ICD-10-CM | POA: Diagnosis not present

## 2016-12-08 DIAGNOSIS — E782 Mixed hyperlipidemia: Secondary | ICD-10-CM

## 2016-12-08 DIAGNOSIS — N2581 Secondary hyperparathyroidism of renal origin: Secondary | ICD-10-CM | POA: Diagnosis not present

## 2016-12-08 DIAGNOSIS — D631 Anemia in chronic kidney disease: Secondary | ICD-10-CM | POA: Diagnosis not present

## 2016-12-08 DIAGNOSIS — E0922 Drug or chemical induced diabetes mellitus with diabetic chronic kidney disease: Secondary | ICD-10-CM

## 2016-12-08 DIAGNOSIS — Z1212 Encounter for screening for malignant neoplasm of rectum: Secondary | ICD-10-CM

## 2016-12-08 LAB — HEPATIC FUNCTION PANEL
ALT: 13 U/L (ref 6–29)
AST: 18 U/L (ref 10–35)
Albumin: 3.5 g/dL — ABNORMAL LOW (ref 3.6–5.1)
Alkaline Phosphatase: 155 U/L — ABNORMAL HIGH (ref 33–130)
BILIRUBIN INDIRECT: 0.4 mg/dL (ref 0.2–1.2)
BILIRUBIN TOTAL: 0.5 mg/dL (ref 0.2–1.2)
Bilirubin, Direct: 0.1 mg/dL (ref <=0.2)
TOTAL PROTEIN: 6.4 g/dL (ref 6.1–8.1)

## 2016-12-08 LAB — HEMOGLOBIN A1C
Hgb A1c MFr Bld: 4.4 % (ref <5.7)
Mean Plasma Glucose: 80 mg/dL

## 2016-12-08 LAB — BASIC METABOLIC PANEL WITH GFR
BUN: 71 mg/dL — ABNORMAL HIGH (ref 7–25)
CALCIUM: 9.4 mg/dL (ref 8.6–10.4)
CHLORIDE: 100 mmol/L (ref 98–110)
CO2: 26 mmol/L (ref 20–31)
CREATININE: 7.3 mg/dL — AB (ref 0.60–0.93)
GFR, Est African American: 6 mL/min — ABNORMAL LOW (ref >=60)
GFR, Est Non African American: 5 mL/min — ABNORMAL LOW (ref >=60)
GLUCOSE: 118 mg/dL — AB (ref 65–99)
Potassium: 4.3 mmol/L (ref 3.5–5.3)
SODIUM: 139 mmol/L (ref 135–146)

## 2016-12-08 LAB — LIPID PANEL
Cholesterol: 217 mg/dL — ABNORMAL HIGH (ref <200)
HDL: 74 mg/dL (ref >50)
LDL CALC: 122 mg/dL — AB (ref <100)
Total CHOL/HDL Ratio: 2.9 Ratio (ref <5.0)
Triglycerides: 107 mg/dL (ref <150)
VLDL: 21 mg/dL (ref <30)

## 2016-12-08 LAB — TSH: TSH: 2.46 mIU/L

## 2016-12-09 ENCOUNTER — Other Ambulatory Visit: Payer: Self-pay | Admitting: Internal Medicine

## 2016-12-09 DIAGNOSIS — N2589 Other disorders resulting from impaired renal tubular function: Secondary | ICD-10-CM | POA: Diagnosis not present

## 2016-12-09 DIAGNOSIS — D509 Iron deficiency anemia, unspecified: Secondary | ICD-10-CM | POA: Diagnosis not present

## 2016-12-09 DIAGNOSIS — E440 Moderate protein-calorie malnutrition: Secondary | ICD-10-CM | POA: Diagnosis not present

## 2016-12-09 DIAGNOSIS — E119 Type 2 diabetes mellitus without complications: Secondary | ICD-10-CM | POA: Diagnosis not present

## 2016-12-09 DIAGNOSIS — N186 End stage renal disease: Secondary | ICD-10-CM | POA: Diagnosis not present

## 2016-12-09 DIAGNOSIS — E782 Mixed hyperlipidemia: Secondary | ICD-10-CM

## 2016-12-09 DIAGNOSIS — Z4932 Encounter for adequacy testing for peritoneal dialysis: Secondary | ICD-10-CM | POA: Diagnosis not present

## 2016-12-09 DIAGNOSIS — N2581 Secondary hyperparathyroidism of renal origin: Secondary | ICD-10-CM | POA: Diagnosis not present

## 2016-12-09 DIAGNOSIS — E784 Other hyperlipidemia: Secondary | ICD-10-CM | POA: Diagnosis not present

## 2016-12-09 DIAGNOSIS — D631 Anemia in chronic kidney disease: Secondary | ICD-10-CM | POA: Diagnosis not present

## 2016-12-09 DIAGNOSIS — D513 Other dietary vitamin B12 deficiency anemia: Secondary | ICD-10-CM | POA: Diagnosis not present

## 2016-12-09 LAB — CBC WITH DIFFERENTIAL/PLATELET
BASOS ABS: 0 {cells}/uL (ref 0–200)
Basophils Relative: 0 %
EOS ABS: 204 {cells}/uL (ref 15–500)
Eosinophils Relative: 3 %
HEMATOCRIT: 37.3 % (ref 35.0–45.0)
Hemoglobin: 11.4 g/dL — ABNORMAL LOW (ref 11.7–15.5)
LYMPHS PCT: 29 %
Lymphs Abs: 1972 cells/uL (ref 850–3900)
MCH: 30.5 pg (ref 27.0–33.0)
MCHC: 30.6 g/dL — AB (ref 32.0–36.0)
MCV: 99.7 fL (ref 80.0–100.0)
MONO ABS: 272 {cells}/uL (ref 200–950)
MONOS PCT: 4 %
MPV: 9.4 fL (ref 7.5–12.5)
NEUTROS PCT: 64 %
Neutro Abs: 4352 cells/uL (ref 1500–7800)
PLATELETS: 233 10*3/uL (ref 140–400)
RBC: 3.74 MIL/uL — ABNORMAL LOW (ref 3.80–5.10)
RDW: 15.8 % — AB (ref 11.0–15.0)
WBC: 6.8 10*3/uL (ref 3.8–10.8)

## 2016-12-09 LAB — URINALYSIS, MICROSCOPIC ONLY
BACTERIA UA: NONE SEEN [HPF]
Casts: NONE SEEN [LPF]
Crystals: NONE SEEN [HPF]
RBC / HPF: NONE SEEN RBC/HPF (ref <=2)
Yeast: NONE SEEN [HPF]

## 2016-12-09 LAB — MAGNESIUM: MAGNESIUM: 2.2 mg/dL (ref 1.5–2.5)

## 2016-12-09 LAB — URINALYSIS, ROUTINE W REFLEX MICROSCOPIC
Bilirubin Urine: NEGATIVE
GLUCOSE, UA: NEGATIVE
Hgb urine dipstick: NEGATIVE
Ketones, ur: NEGATIVE
Nitrite: NEGATIVE
Specific Gravity, Urine: 1.014 (ref 1.001–1.035)
pH: 6.5 (ref 5.0–8.0)

## 2016-12-09 LAB — MICROALBUMIN, URINE: Microalb, Ur: 61.7 mg/dL

## 2016-12-09 LAB — VITAMIN D 25 HYDROXY (VIT D DEFICIENCY, FRACTURES): VIT D 25 HYDROXY: 31 ng/mL (ref 30–100)

## 2016-12-09 MED ORDER — ATORVASTATIN CALCIUM 20 MG PO TABS: ORAL_TABLET | ORAL | 1 refills | Status: DC

## 2016-12-10 DIAGNOSIS — N186 End stage renal disease: Secondary | ICD-10-CM | POA: Diagnosis not present

## 2016-12-10 DIAGNOSIS — D513 Other dietary vitamin B12 deficiency anemia: Secondary | ICD-10-CM | POA: Diagnosis not present

## 2016-12-10 DIAGNOSIS — D631 Anemia in chronic kidney disease: Secondary | ICD-10-CM | POA: Diagnosis not present

## 2016-12-10 DIAGNOSIS — N2581 Secondary hyperparathyroidism of renal origin: Secondary | ICD-10-CM | POA: Diagnosis not present

## 2016-12-10 DIAGNOSIS — Z4932 Encounter for adequacy testing for peritoneal dialysis: Secondary | ICD-10-CM | POA: Diagnosis not present

## 2016-12-10 DIAGNOSIS — E119 Type 2 diabetes mellitus without complications: Secondary | ICD-10-CM | POA: Diagnosis not present

## 2016-12-10 DIAGNOSIS — E440 Moderate protein-calorie malnutrition: Secondary | ICD-10-CM | POA: Diagnosis not present

## 2016-12-10 DIAGNOSIS — D509 Iron deficiency anemia, unspecified: Secondary | ICD-10-CM | POA: Diagnosis not present

## 2016-12-10 DIAGNOSIS — N2589 Other disorders resulting from impaired renal tubular function: Secondary | ICD-10-CM | POA: Diagnosis not present

## 2016-12-10 DIAGNOSIS — E784 Other hyperlipidemia: Secondary | ICD-10-CM | POA: Diagnosis not present

## 2016-12-11 DIAGNOSIS — E119 Type 2 diabetes mellitus without complications: Secondary | ICD-10-CM | POA: Diagnosis not present

## 2016-12-11 DIAGNOSIS — D509 Iron deficiency anemia, unspecified: Secondary | ICD-10-CM | POA: Diagnosis not present

## 2016-12-11 DIAGNOSIS — E784 Other hyperlipidemia: Secondary | ICD-10-CM | POA: Diagnosis not present

## 2016-12-11 DIAGNOSIS — N2581 Secondary hyperparathyroidism of renal origin: Secondary | ICD-10-CM | POA: Diagnosis not present

## 2016-12-11 DIAGNOSIS — D513 Other dietary vitamin B12 deficiency anemia: Secondary | ICD-10-CM | POA: Diagnosis not present

## 2016-12-11 DIAGNOSIS — Z4932 Encounter for adequacy testing for peritoneal dialysis: Secondary | ICD-10-CM | POA: Diagnosis not present

## 2016-12-11 DIAGNOSIS — D631 Anemia in chronic kidney disease: Secondary | ICD-10-CM | POA: Diagnosis not present

## 2016-12-11 DIAGNOSIS — N186 End stage renal disease: Secondary | ICD-10-CM | POA: Diagnosis not present

## 2016-12-11 DIAGNOSIS — E440 Moderate protein-calorie malnutrition: Secondary | ICD-10-CM | POA: Diagnosis not present

## 2016-12-11 DIAGNOSIS — N2589 Other disorders resulting from impaired renal tubular function: Secondary | ICD-10-CM | POA: Diagnosis not present

## 2016-12-11 LAB — INSULIN, RANDOM: INSULIN: 4.3 u[IU]/mL (ref 2.0–19.6)

## 2016-12-12 DIAGNOSIS — D631 Anemia in chronic kidney disease: Secondary | ICD-10-CM | POA: Diagnosis not present

## 2016-12-12 DIAGNOSIS — N2581 Secondary hyperparathyroidism of renal origin: Secondary | ICD-10-CM | POA: Diagnosis not present

## 2016-12-12 DIAGNOSIS — E784 Other hyperlipidemia: Secondary | ICD-10-CM | POA: Diagnosis not present

## 2016-12-12 DIAGNOSIS — N186 End stage renal disease: Secondary | ICD-10-CM | POA: Diagnosis not present

## 2016-12-12 DIAGNOSIS — E119 Type 2 diabetes mellitus without complications: Secondary | ICD-10-CM | POA: Diagnosis not present

## 2016-12-12 DIAGNOSIS — E440 Moderate protein-calorie malnutrition: Secondary | ICD-10-CM | POA: Diagnosis not present

## 2016-12-12 DIAGNOSIS — D509 Iron deficiency anemia, unspecified: Secondary | ICD-10-CM | POA: Diagnosis not present

## 2016-12-12 DIAGNOSIS — D513 Other dietary vitamin B12 deficiency anemia: Secondary | ICD-10-CM | POA: Diagnosis not present

## 2016-12-12 DIAGNOSIS — N2589 Other disorders resulting from impaired renal tubular function: Secondary | ICD-10-CM | POA: Diagnosis not present

## 2016-12-12 DIAGNOSIS — Z4932 Encounter for adequacy testing for peritoneal dialysis: Secondary | ICD-10-CM | POA: Diagnosis not present

## 2016-12-13 DIAGNOSIS — N2581 Secondary hyperparathyroidism of renal origin: Secondary | ICD-10-CM | POA: Diagnosis not present

## 2016-12-13 DIAGNOSIS — D513 Other dietary vitamin B12 deficiency anemia: Secondary | ICD-10-CM | POA: Diagnosis not present

## 2016-12-13 DIAGNOSIS — E440 Moderate protein-calorie malnutrition: Secondary | ICD-10-CM | POA: Diagnosis not present

## 2016-12-13 DIAGNOSIS — D631 Anemia in chronic kidney disease: Secondary | ICD-10-CM | POA: Diagnosis not present

## 2016-12-13 DIAGNOSIS — E784 Other hyperlipidemia: Secondary | ICD-10-CM | POA: Diagnosis not present

## 2016-12-13 DIAGNOSIS — Z4932 Encounter for adequacy testing for peritoneal dialysis: Secondary | ICD-10-CM | POA: Diagnosis not present

## 2016-12-13 DIAGNOSIS — N186 End stage renal disease: Secondary | ICD-10-CM | POA: Diagnosis not present

## 2016-12-13 DIAGNOSIS — N2589 Other disorders resulting from impaired renal tubular function: Secondary | ICD-10-CM | POA: Diagnosis not present

## 2016-12-13 DIAGNOSIS — E119 Type 2 diabetes mellitus without complications: Secondary | ICD-10-CM | POA: Diagnosis not present

## 2016-12-13 DIAGNOSIS — D509 Iron deficiency anemia, unspecified: Secondary | ICD-10-CM | POA: Diagnosis not present

## 2016-12-14 DIAGNOSIS — N2581 Secondary hyperparathyroidism of renal origin: Secondary | ICD-10-CM | POA: Diagnosis not present

## 2016-12-14 DIAGNOSIS — E440 Moderate protein-calorie malnutrition: Secondary | ICD-10-CM | POA: Diagnosis not present

## 2016-12-14 DIAGNOSIS — D509 Iron deficiency anemia, unspecified: Secondary | ICD-10-CM | POA: Diagnosis not present

## 2016-12-14 DIAGNOSIS — N186 End stage renal disease: Secondary | ICD-10-CM | POA: Diagnosis not present

## 2016-12-14 DIAGNOSIS — Z4932 Encounter for adequacy testing for peritoneal dialysis: Secondary | ICD-10-CM | POA: Diagnosis not present

## 2016-12-14 DIAGNOSIS — E784 Other hyperlipidemia: Secondary | ICD-10-CM | POA: Diagnosis not present

## 2016-12-14 DIAGNOSIS — N2589 Other disorders resulting from impaired renal tubular function: Secondary | ICD-10-CM | POA: Diagnosis not present

## 2016-12-14 DIAGNOSIS — E119 Type 2 diabetes mellitus without complications: Secondary | ICD-10-CM | POA: Diagnosis not present

## 2016-12-14 DIAGNOSIS — D631 Anemia in chronic kidney disease: Secondary | ICD-10-CM | POA: Diagnosis not present

## 2016-12-14 DIAGNOSIS — D513 Other dietary vitamin B12 deficiency anemia: Secondary | ICD-10-CM | POA: Diagnosis not present

## 2016-12-15 DIAGNOSIS — Z4932 Encounter for adequacy testing for peritoneal dialysis: Secondary | ICD-10-CM | POA: Diagnosis not present

## 2016-12-15 DIAGNOSIS — N2589 Other disorders resulting from impaired renal tubular function: Secondary | ICD-10-CM | POA: Diagnosis not present

## 2016-12-15 DIAGNOSIS — N2581 Secondary hyperparathyroidism of renal origin: Secondary | ICD-10-CM | POA: Diagnosis not present

## 2016-12-15 DIAGNOSIS — D509 Iron deficiency anemia, unspecified: Secondary | ICD-10-CM | POA: Diagnosis not present

## 2016-12-15 DIAGNOSIS — D631 Anemia in chronic kidney disease: Secondary | ICD-10-CM | POA: Diagnosis not present

## 2016-12-15 DIAGNOSIS — E440 Moderate protein-calorie malnutrition: Secondary | ICD-10-CM | POA: Diagnosis not present

## 2016-12-15 DIAGNOSIS — D513 Other dietary vitamin B12 deficiency anemia: Secondary | ICD-10-CM | POA: Diagnosis not present

## 2016-12-15 DIAGNOSIS — N186 End stage renal disease: Secondary | ICD-10-CM | POA: Diagnosis not present

## 2016-12-15 DIAGNOSIS — E119 Type 2 diabetes mellitus without complications: Secondary | ICD-10-CM | POA: Diagnosis not present

## 2016-12-15 DIAGNOSIS — E784 Other hyperlipidemia: Secondary | ICD-10-CM | POA: Diagnosis not present

## 2016-12-16 DIAGNOSIS — N2581 Secondary hyperparathyroidism of renal origin: Secondary | ICD-10-CM | POA: Diagnosis not present

## 2016-12-16 DIAGNOSIS — N2589 Other disorders resulting from impaired renal tubular function: Secondary | ICD-10-CM | POA: Diagnosis not present

## 2016-12-16 DIAGNOSIS — E119 Type 2 diabetes mellitus without complications: Secondary | ICD-10-CM | POA: Diagnosis not present

## 2016-12-16 DIAGNOSIS — E784 Other hyperlipidemia: Secondary | ICD-10-CM | POA: Diagnosis not present

## 2016-12-16 DIAGNOSIS — D631 Anemia in chronic kidney disease: Secondary | ICD-10-CM | POA: Diagnosis not present

## 2016-12-16 DIAGNOSIS — Z4932 Encounter for adequacy testing for peritoneal dialysis: Secondary | ICD-10-CM | POA: Diagnosis not present

## 2016-12-16 DIAGNOSIS — D513 Other dietary vitamin B12 deficiency anemia: Secondary | ICD-10-CM | POA: Diagnosis not present

## 2016-12-16 DIAGNOSIS — N186 End stage renal disease: Secondary | ICD-10-CM | POA: Diagnosis not present

## 2016-12-16 DIAGNOSIS — E440 Moderate protein-calorie malnutrition: Secondary | ICD-10-CM | POA: Diagnosis not present

## 2016-12-16 DIAGNOSIS — D509 Iron deficiency anemia, unspecified: Secondary | ICD-10-CM | POA: Diagnosis not present

## 2016-12-17 DIAGNOSIS — D631 Anemia in chronic kidney disease: Secondary | ICD-10-CM | POA: Diagnosis not present

## 2016-12-17 DIAGNOSIS — E119 Type 2 diabetes mellitus without complications: Secondary | ICD-10-CM | POA: Diagnosis not present

## 2016-12-17 DIAGNOSIS — Z4932 Encounter for adequacy testing for peritoneal dialysis: Secondary | ICD-10-CM | POA: Diagnosis not present

## 2016-12-17 DIAGNOSIS — E440 Moderate protein-calorie malnutrition: Secondary | ICD-10-CM | POA: Diagnosis not present

## 2016-12-17 DIAGNOSIS — N186 End stage renal disease: Secondary | ICD-10-CM | POA: Diagnosis not present

## 2016-12-17 DIAGNOSIS — N2589 Other disorders resulting from impaired renal tubular function: Secondary | ICD-10-CM | POA: Diagnosis not present

## 2016-12-17 DIAGNOSIS — D509 Iron deficiency anemia, unspecified: Secondary | ICD-10-CM | POA: Diagnosis not present

## 2016-12-17 DIAGNOSIS — E784 Other hyperlipidemia: Secondary | ICD-10-CM | POA: Diagnosis not present

## 2016-12-17 DIAGNOSIS — N2581 Secondary hyperparathyroidism of renal origin: Secondary | ICD-10-CM | POA: Diagnosis not present

## 2016-12-17 DIAGNOSIS — D513 Other dietary vitamin B12 deficiency anemia: Secondary | ICD-10-CM | POA: Diagnosis not present

## 2016-12-18 DIAGNOSIS — N2581 Secondary hyperparathyroidism of renal origin: Secondary | ICD-10-CM | POA: Diagnosis not present

## 2016-12-18 DIAGNOSIS — E440 Moderate protein-calorie malnutrition: Secondary | ICD-10-CM | POA: Diagnosis not present

## 2016-12-18 DIAGNOSIS — Z4932 Encounter for adequacy testing for peritoneal dialysis: Secondary | ICD-10-CM | POA: Diagnosis not present

## 2016-12-18 DIAGNOSIS — E784 Other hyperlipidemia: Secondary | ICD-10-CM | POA: Diagnosis not present

## 2016-12-18 DIAGNOSIS — D509 Iron deficiency anemia, unspecified: Secondary | ICD-10-CM | POA: Diagnosis not present

## 2016-12-18 DIAGNOSIS — D513 Other dietary vitamin B12 deficiency anemia: Secondary | ICD-10-CM | POA: Diagnosis not present

## 2016-12-18 DIAGNOSIS — N186 End stage renal disease: Secondary | ICD-10-CM | POA: Diagnosis not present

## 2016-12-18 DIAGNOSIS — D631 Anemia in chronic kidney disease: Secondary | ICD-10-CM | POA: Diagnosis not present

## 2016-12-18 DIAGNOSIS — E119 Type 2 diabetes mellitus without complications: Secondary | ICD-10-CM | POA: Diagnosis not present

## 2016-12-18 DIAGNOSIS — N2589 Other disorders resulting from impaired renal tubular function: Secondary | ICD-10-CM | POA: Diagnosis not present

## 2016-12-19 DIAGNOSIS — Z4932 Encounter for adequacy testing for peritoneal dialysis: Secondary | ICD-10-CM | POA: Diagnosis not present

## 2016-12-19 DIAGNOSIS — N186 End stage renal disease: Secondary | ICD-10-CM | POA: Diagnosis not present

## 2016-12-19 DIAGNOSIS — E119 Type 2 diabetes mellitus without complications: Secondary | ICD-10-CM | POA: Diagnosis not present

## 2016-12-19 DIAGNOSIS — E784 Other hyperlipidemia: Secondary | ICD-10-CM | POA: Diagnosis not present

## 2016-12-19 DIAGNOSIS — D509 Iron deficiency anemia, unspecified: Secondary | ICD-10-CM | POA: Diagnosis not present

## 2016-12-19 DIAGNOSIS — N2589 Other disorders resulting from impaired renal tubular function: Secondary | ICD-10-CM | POA: Diagnosis not present

## 2016-12-19 DIAGNOSIS — D513 Other dietary vitamin B12 deficiency anemia: Secondary | ICD-10-CM | POA: Diagnosis not present

## 2016-12-19 DIAGNOSIS — E440 Moderate protein-calorie malnutrition: Secondary | ICD-10-CM | POA: Diagnosis not present

## 2016-12-19 DIAGNOSIS — N2581 Secondary hyperparathyroidism of renal origin: Secondary | ICD-10-CM | POA: Diagnosis not present

## 2016-12-19 DIAGNOSIS — D631 Anemia in chronic kidney disease: Secondary | ICD-10-CM | POA: Diagnosis not present

## 2016-12-20 DIAGNOSIS — N2581 Secondary hyperparathyroidism of renal origin: Secondary | ICD-10-CM | POA: Diagnosis not present

## 2016-12-20 DIAGNOSIS — Z992 Dependence on renal dialysis: Secondary | ICD-10-CM | POA: Diagnosis not present

## 2016-12-20 DIAGNOSIS — E119 Type 2 diabetes mellitus without complications: Secondary | ICD-10-CM | POA: Diagnosis not present

## 2016-12-20 DIAGNOSIS — E784 Other hyperlipidemia: Secondary | ICD-10-CM | POA: Diagnosis not present

## 2016-12-20 DIAGNOSIS — N186 End stage renal disease: Secondary | ICD-10-CM | POA: Diagnosis not present

## 2016-12-20 DIAGNOSIS — E1122 Type 2 diabetes mellitus with diabetic chronic kidney disease: Secondary | ICD-10-CM | POA: Diagnosis not present

## 2016-12-20 DIAGNOSIS — E440 Moderate protein-calorie malnutrition: Secondary | ICD-10-CM | POA: Diagnosis not present

## 2016-12-20 DIAGNOSIS — Z4932 Encounter for adequacy testing for peritoneal dialysis: Secondary | ICD-10-CM | POA: Diagnosis not present

## 2016-12-20 DIAGNOSIS — N2589 Other disorders resulting from impaired renal tubular function: Secondary | ICD-10-CM | POA: Diagnosis not present

## 2016-12-20 DIAGNOSIS — D631 Anemia in chronic kidney disease: Secondary | ICD-10-CM | POA: Diagnosis not present

## 2016-12-20 DIAGNOSIS — D513 Other dietary vitamin B12 deficiency anemia: Secondary | ICD-10-CM | POA: Diagnosis not present

## 2016-12-20 DIAGNOSIS — D509 Iron deficiency anemia, unspecified: Secondary | ICD-10-CM | POA: Diagnosis not present

## 2016-12-21 DIAGNOSIS — D631 Anemia in chronic kidney disease: Secondary | ICD-10-CM | POA: Diagnosis not present

## 2016-12-21 DIAGNOSIS — E440 Moderate protein-calorie malnutrition: Secondary | ICD-10-CM | POA: Diagnosis not present

## 2016-12-21 DIAGNOSIS — N2581 Secondary hyperparathyroidism of renal origin: Secondary | ICD-10-CM | POA: Diagnosis not present

## 2016-12-21 DIAGNOSIS — D509 Iron deficiency anemia, unspecified: Secondary | ICD-10-CM | POA: Diagnosis not present

## 2016-12-21 DIAGNOSIS — R8299 Other abnormal findings in urine: Secondary | ICD-10-CM | POA: Diagnosis not present

## 2016-12-21 DIAGNOSIS — Z79899 Other long term (current) drug therapy: Secondary | ICD-10-CM | POA: Diagnosis not present

## 2016-12-21 DIAGNOSIS — N186 End stage renal disease: Secondary | ICD-10-CM | POA: Diagnosis not present

## 2016-12-21 DIAGNOSIS — R17 Unspecified jaundice: Secondary | ICD-10-CM | POA: Diagnosis not present

## 2016-12-22 DIAGNOSIS — N186 End stage renal disease: Secondary | ICD-10-CM | POA: Diagnosis not present

## 2016-12-22 DIAGNOSIS — D631 Anemia in chronic kidney disease: Secondary | ICD-10-CM | POA: Diagnosis not present

## 2016-12-22 DIAGNOSIS — D509 Iron deficiency anemia, unspecified: Secondary | ICD-10-CM | POA: Diagnosis not present

## 2016-12-22 DIAGNOSIS — E440 Moderate protein-calorie malnutrition: Secondary | ICD-10-CM | POA: Diagnosis not present

## 2016-12-22 DIAGNOSIS — N2581 Secondary hyperparathyroidism of renal origin: Secondary | ICD-10-CM | POA: Diagnosis not present

## 2016-12-22 DIAGNOSIS — R8299 Other abnormal findings in urine: Secondary | ICD-10-CM | POA: Diagnosis not present

## 2016-12-22 DIAGNOSIS — R17 Unspecified jaundice: Secondary | ICD-10-CM | POA: Diagnosis not present

## 2016-12-22 DIAGNOSIS — Z79899 Other long term (current) drug therapy: Secondary | ICD-10-CM | POA: Diagnosis not present

## 2016-12-23 DIAGNOSIS — D631 Anemia in chronic kidney disease: Secondary | ICD-10-CM | POA: Diagnosis not present

## 2016-12-23 DIAGNOSIS — R17 Unspecified jaundice: Secondary | ICD-10-CM | POA: Diagnosis not present

## 2016-12-23 DIAGNOSIS — N186 End stage renal disease: Secondary | ICD-10-CM | POA: Diagnosis not present

## 2016-12-23 DIAGNOSIS — D509 Iron deficiency anemia, unspecified: Secondary | ICD-10-CM | POA: Diagnosis not present

## 2016-12-23 DIAGNOSIS — Z79899 Other long term (current) drug therapy: Secondary | ICD-10-CM | POA: Diagnosis not present

## 2016-12-23 DIAGNOSIS — E440 Moderate protein-calorie malnutrition: Secondary | ICD-10-CM | POA: Diagnosis not present

## 2016-12-23 DIAGNOSIS — R8299 Other abnormal findings in urine: Secondary | ICD-10-CM | POA: Diagnosis not present

## 2016-12-23 DIAGNOSIS — N2581 Secondary hyperparathyroidism of renal origin: Secondary | ICD-10-CM | POA: Diagnosis not present

## 2016-12-24 DIAGNOSIS — N186 End stage renal disease: Secondary | ICD-10-CM | POA: Diagnosis not present

## 2016-12-24 DIAGNOSIS — D631 Anemia in chronic kidney disease: Secondary | ICD-10-CM | POA: Diagnosis not present

## 2016-12-24 DIAGNOSIS — E440 Moderate protein-calorie malnutrition: Secondary | ICD-10-CM | POA: Diagnosis not present

## 2016-12-24 DIAGNOSIS — Z79899 Other long term (current) drug therapy: Secondary | ICD-10-CM | POA: Diagnosis not present

## 2016-12-24 DIAGNOSIS — R8299 Other abnormal findings in urine: Secondary | ICD-10-CM | POA: Diagnosis not present

## 2016-12-24 DIAGNOSIS — N2581 Secondary hyperparathyroidism of renal origin: Secondary | ICD-10-CM | POA: Diagnosis not present

## 2016-12-24 DIAGNOSIS — R17 Unspecified jaundice: Secondary | ICD-10-CM | POA: Diagnosis not present

## 2016-12-24 DIAGNOSIS — D509 Iron deficiency anemia, unspecified: Secondary | ICD-10-CM | POA: Diagnosis not present

## 2016-12-25 DIAGNOSIS — D509 Iron deficiency anemia, unspecified: Secondary | ICD-10-CM | POA: Diagnosis not present

## 2016-12-25 DIAGNOSIS — N2581 Secondary hyperparathyroidism of renal origin: Secondary | ICD-10-CM | POA: Diagnosis not present

## 2016-12-25 DIAGNOSIS — Z79899 Other long term (current) drug therapy: Secondary | ICD-10-CM | POA: Diagnosis not present

## 2016-12-25 DIAGNOSIS — D631 Anemia in chronic kidney disease: Secondary | ICD-10-CM | POA: Diagnosis not present

## 2016-12-25 DIAGNOSIS — N186 End stage renal disease: Secondary | ICD-10-CM | POA: Diagnosis not present

## 2016-12-25 DIAGNOSIS — R17 Unspecified jaundice: Secondary | ICD-10-CM | POA: Diagnosis not present

## 2016-12-25 DIAGNOSIS — R8299 Other abnormal findings in urine: Secondary | ICD-10-CM | POA: Diagnosis not present

## 2016-12-25 DIAGNOSIS — E440 Moderate protein-calorie malnutrition: Secondary | ICD-10-CM | POA: Diagnosis not present

## 2016-12-26 DIAGNOSIS — E440 Moderate protein-calorie malnutrition: Secondary | ICD-10-CM | POA: Diagnosis not present

## 2016-12-26 DIAGNOSIS — N2581 Secondary hyperparathyroidism of renal origin: Secondary | ICD-10-CM | POA: Diagnosis not present

## 2016-12-26 DIAGNOSIS — R17 Unspecified jaundice: Secondary | ICD-10-CM | POA: Diagnosis not present

## 2016-12-26 DIAGNOSIS — R8299 Other abnormal findings in urine: Secondary | ICD-10-CM | POA: Diagnosis not present

## 2016-12-26 DIAGNOSIS — N186 End stage renal disease: Secondary | ICD-10-CM | POA: Diagnosis not present

## 2016-12-26 DIAGNOSIS — D631 Anemia in chronic kidney disease: Secondary | ICD-10-CM | POA: Diagnosis not present

## 2016-12-26 DIAGNOSIS — D509 Iron deficiency anemia, unspecified: Secondary | ICD-10-CM | POA: Diagnosis not present

## 2016-12-26 DIAGNOSIS — Z79899 Other long term (current) drug therapy: Secondary | ICD-10-CM | POA: Diagnosis not present

## 2016-12-27 DIAGNOSIS — Z79899 Other long term (current) drug therapy: Secondary | ICD-10-CM | POA: Diagnosis not present

## 2016-12-27 DIAGNOSIS — R17 Unspecified jaundice: Secondary | ICD-10-CM | POA: Diagnosis not present

## 2016-12-27 DIAGNOSIS — R8299 Other abnormal findings in urine: Secondary | ICD-10-CM | POA: Diagnosis not present

## 2016-12-27 DIAGNOSIS — D509 Iron deficiency anemia, unspecified: Secondary | ICD-10-CM | POA: Diagnosis not present

## 2016-12-27 DIAGNOSIS — E440 Moderate protein-calorie malnutrition: Secondary | ICD-10-CM | POA: Diagnosis not present

## 2016-12-27 DIAGNOSIS — D631 Anemia in chronic kidney disease: Secondary | ICD-10-CM | POA: Diagnosis not present

## 2016-12-27 DIAGNOSIS — N2581 Secondary hyperparathyroidism of renal origin: Secondary | ICD-10-CM | POA: Diagnosis not present

## 2016-12-27 DIAGNOSIS — N186 End stage renal disease: Secondary | ICD-10-CM | POA: Diagnosis not present

## 2016-12-28 DIAGNOSIS — R17 Unspecified jaundice: Secondary | ICD-10-CM | POA: Diagnosis not present

## 2016-12-28 DIAGNOSIS — D509 Iron deficiency anemia, unspecified: Secondary | ICD-10-CM | POA: Diagnosis not present

## 2016-12-28 DIAGNOSIS — N186 End stage renal disease: Secondary | ICD-10-CM | POA: Diagnosis not present

## 2016-12-28 DIAGNOSIS — Z79899 Other long term (current) drug therapy: Secondary | ICD-10-CM | POA: Diagnosis not present

## 2016-12-28 DIAGNOSIS — E440 Moderate protein-calorie malnutrition: Secondary | ICD-10-CM | POA: Diagnosis not present

## 2016-12-28 DIAGNOSIS — N2581 Secondary hyperparathyroidism of renal origin: Secondary | ICD-10-CM | POA: Diagnosis not present

## 2016-12-28 DIAGNOSIS — R8299 Other abnormal findings in urine: Secondary | ICD-10-CM | POA: Diagnosis not present

## 2016-12-28 DIAGNOSIS — D631 Anemia in chronic kidney disease: Secondary | ICD-10-CM | POA: Diagnosis not present

## 2016-12-29 DIAGNOSIS — R17 Unspecified jaundice: Secondary | ICD-10-CM | POA: Diagnosis not present

## 2016-12-29 DIAGNOSIS — D631 Anemia in chronic kidney disease: Secondary | ICD-10-CM | POA: Diagnosis not present

## 2016-12-29 DIAGNOSIS — R8299 Other abnormal findings in urine: Secondary | ICD-10-CM | POA: Diagnosis not present

## 2016-12-29 DIAGNOSIS — D509 Iron deficiency anemia, unspecified: Secondary | ICD-10-CM | POA: Diagnosis not present

## 2016-12-29 DIAGNOSIS — N186 End stage renal disease: Secondary | ICD-10-CM | POA: Diagnosis not present

## 2016-12-29 DIAGNOSIS — N2581 Secondary hyperparathyroidism of renal origin: Secondary | ICD-10-CM | POA: Diagnosis not present

## 2016-12-29 DIAGNOSIS — E440 Moderate protein-calorie malnutrition: Secondary | ICD-10-CM | POA: Diagnosis not present

## 2016-12-29 DIAGNOSIS — Z79899 Other long term (current) drug therapy: Secondary | ICD-10-CM | POA: Diagnosis not present

## 2016-12-30 DIAGNOSIS — E440 Moderate protein-calorie malnutrition: Secondary | ICD-10-CM | POA: Diagnosis not present

## 2016-12-30 DIAGNOSIS — D509 Iron deficiency anemia, unspecified: Secondary | ICD-10-CM | POA: Diagnosis not present

## 2016-12-30 DIAGNOSIS — R17 Unspecified jaundice: Secondary | ICD-10-CM | POA: Diagnosis not present

## 2016-12-30 DIAGNOSIS — D631 Anemia in chronic kidney disease: Secondary | ICD-10-CM | POA: Diagnosis not present

## 2016-12-30 DIAGNOSIS — N2581 Secondary hyperparathyroidism of renal origin: Secondary | ICD-10-CM | POA: Diagnosis not present

## 2016-12-30 DIAGNOSIS — R8299 Other abnormal findings in urine: Secondary | ICD-10-CM | POA: Diagnosis not present

## 2016-12-30 DIAGNOSIS — Z79899 Other long term (current) drug therapy: Secondary | ICD-10-CM | POA: Diagnosis not present

## 2016-12-30 DIAGNOSIS — N186 End stage renal disease: Secondary | ICD-10-CM | POA: Diagnosis not present

## 2016-12-31 DIAGNOSIS — D631 Anemia in chronic kidney disease: Secondary | ICD-10-CM | POA: Diagnosis not present

## 2016-12-31 DIAGNOSIS — R8299 Other abnormal findings in urine: Secondary | ICD-10-CM | POA: Diagnosis not present

## 2016-12-31 DIAGNOSIS — E440 Moderate protein-calorie malnutrition: Secondary | ICD-10-CM | POA: Diagnosis not present

## 2016-12-31 DIAGNOSIS — N186 End stage renal disease: Secondary | ICD-10-CM | POA: Diagnosis not present

## 2016-12-31 DIAGNOSIS — Z79899 Other long term (current) drug therapy: Secondary | ICD-10-CM | POA: Diagnosis not present

## 2016-12-31 DIAGNOSIS — R17 Unspecified jaundice: Secondary | ICD-10-CM | POA: Diagnosis not present

## 2016-12-31 DIAGNOSIS — N2581 Secondary hyperparathyroidism of renal origin: Secondary | ICD-10-CM | POA: Diagnosis not present

## 2016-12-31 DIAGNOSIS — D509 Iron deficiency anemia, unspecified: Secondary | ICD-10-CM | POA: Diagnosis not present

## 2017-01-01 DIAGNOSIS — N186 End stage renal disease: Secondary | ICD-10-CM | POA: Diagnosis not present

## 2017-01-01 DIAGNOSIS — R8299 Other abnormal findings in urine: Secondary | ICD-10-CM | POA: Diagnosis not present

## 2017-01-01 DIAGNOSIS — R17 Unspecified jaundice: Secondary | ICD-10-CM | POA: Diagnosis not present

## 2017-01-01 DIAGNOSIS — D509 Iron deficiency anemia, unspecified: Secondary | ICD-10-CM | POA: Diagnosis not present

## 2017-01-01 DIAGNOSIS — Z79899 Other long term (current) drug therapy: Secondary | ICD-10-CM | POA: Diagnosis not present

## 2017-01-01 DIAGNOSIS — D631 Anemia in chronic kidney disease: Secondary | ICD-10-CM | POA: Diagnosis not present

## 2017-01-01 DIAGNOSIS — E440 Moderate protein-calorie malnutrition: Secondary | ICD-10-CM | POA: Diagnosis not present

## 2017-01-01 DIAGNOSIS — N2581 Secondary hyperparathyroidism of renal origin: Secondary | ICD-10-CM | POA: Diagnosis not present

## 2017-01-02 DIAGNOSIS — N2581 Secondary hyperparathyroidism of renal origin: Secondary | ICD-10-CM | POA: Diagnosis not present

## 2017-01-02 DIAGNOSIS — Z79899 Other long term (current) drug therapy: Secondary | ICD-10-CM | POA: Diagnosis not present

## 2017-01-02 DIAGNOSIS — E440 Moderate protein-calorie malnutrition: Secondary | ICD-10-CM | POA: Diagnosis not present

## 2017-01-02 DIAGNOSIS — D631 Anemia in chronic kidney disease: Secondary | ICD-10-CM | POA: Diagnosis not present

## 2017-01-02 DIAGNOSIS — R17 Unspecified jaundice: Secondary | ICD-10-CM | POA: Diagnosis not present

## 2017-01-02 DIAGNOSIS — R8299 Other abnormal findings in urine: Secondary | ICD-10-CM | POA: Diagnosis not present

## 2017-01-02 DIAGNOSIS — D509 Iron deficiency anemia, unspecified: Secondary | ICD-10-CM | POA: Diagnosis not present

## 2017-01-02 DIAGNOSIS — N186 End stage renal disease: Secondary | ICD-10-CM | POA: Diagnosis not present

## 2017-01-03 DIAGNOSIS — D509 Iron deficiency anemia, unspecified: Secondary | ICD-10-CM | POA: Diagnosis not present

## 2017-01-03 DIAGNOSIS — N2581 Secondary hyperparathyroidism of renal origin: Secondary | ICD-10-CM | POA: Diagnosis not present

## 2017-01-03 DIAGNOSIS — R17 Unspecified jaundice: Secondary | ICD-10-CM | POA: Diagnosis not present

## 2017-01-03 DIAGNOSIS — N186 End stage renal disease: Secondary | ICD-10-CM | POA: Diagnosis not present

## 2017-01-03 DIAGNOSIS — E440 Moderate protein-calorie malnutrition: Secondary | ICD-10-CM | POA: Diagnosis not present

## 2017-01-03 DIAGNOSIS — Z79899 Other long term (current) drug therapy: Secondary | ICD-10-CM | POA: Diagnosis not present

## 2017-01-03 DIAGNOSIS — R8299 Other abnormal findings in urine: Secondary | ICD-10-CM | POA: Diagnosis not present

## 2017-01-03 DIAGNOSIS — D631 Anemia in chronic kidney disease: Secondary | ICD-10-CM | POA: Diagnosis not present

## 2017-01-04 DIAGNOSIS — D509 Iron deficiency anemia, unspecified: Secondary | ICD-10-CM | POA: Diagnosis not present

## 2017-01-04 DIAGNOSIS — Z79899 Other long term (current) drug therapy: Secondary | ICD-10-CM | POA: Diagnosis not present

## 2017-01-04 DIAGNOSIS — E440 Moderate protein-calorie malnutrition: Secondary | ICD-10-CM | POA: Diagnosis not present

## 2017-01-04 DIAGNOSIS — R17 Unspecified jaundice: Secondary | ICD-10-CM | POA: Diagnosis not present

## 2017-01-04 DIAGNOSIS — N2581 Secondary hyperparathyroidism of renal origin: Secondary | ICD-10-CM | POA: Diagnosis not present

## 2017-01-04 DIAGNOSIS — N186 End stage renal disease: Secondary | ICD-10-CM | POA: Diagnosis not present

## 2017-01-04 DIAGNOSIS — R8299 Other abnormal findings in urine: Secondary | ICD-10-CM | POA: Diagnosis not present

## 2017-01-04 DIAGNOSIS — D631 Anemia in chronic kidney disease: Secondary | ICD-10-CM | POA: Diagnosis not present

## 2017-01-05 DIAGNOSIS — Z79899 Other long term (current) drug therapy: Secondary | ICD-10-CM | POA: Diagnosis not present

## 2017-01-05 DIAGNOSIS — D509 Iron deficiency anemia, unspecified: Secondary | ICD-10-CM | POA: Diagnosis not present

## 2017-01-05 DIAGNOSIS — N186 End stage renal disease: Secondary | ICD-10-CM | POA: Diagnosis not present

## 2017-01-05 DIAGNOSIS — R8299 Other abnormal findings in urine: Secondary | ICD-10-CM | POA: Diagnosis not present

## 2017-01-05 DIAGNOSIS — N2581 Secondary hyperparathyroidism of renal origin: Secondary | ICD-10-CM | POA: Diagnosis not present

## 2017-01-05 DIAGNOSIS — R17 Unspecified jaundice: Secondary | ICD-10-CM | POA: Diagnosis not present

## 2017-01-05 DIAGNOSIS — E440 Moderate protein-calorie malnutrition: Secondary | ICD-10-CM | POA: Diagnosis not present

## 2017-01-05 DIAGNOSIS — D631 Anemia in chronic kidney disease: Secondary | ICD-10-CM | POA: Diagnosis not present

## 2017-01-06 DIAGNOSIS — D631 Anemia in chronic kidney disease: Secondary | ICD-10-CM | POA: Diagnosis not present

## 2017-01-06 DIAGNOSIS — N2581 Secondary hyperparathyroidism of renal origin: Secondary | ICD-10-CM | POA: Diagnosis not present

## 2017-01-06 DIAGNOSIS — N186 End stage renal disease: Secondary | ICD-10-CM | POA: Diagnosis not present

## 2017-01-06 DIAGNOSIS — E440 Moderate protein-calorie malnutrition: Secondary | ICD-10-CM | POA: Diagnosis not present

## 2017-01-06 DIAGNOSIS — D509 Iron deficiency anemia, unspecified: Secondary | ICD-10-CM | POA: Diagnosis not present

## 2017-01-06 DIAGNOSIS — R17 Unspecified jaundice: Secondary | ICD-10-CM | POA: Diagnosis not present

## 2017-01-06 DIAGNOSIS — Z79899 Other long term (current) drug therapy: Secondary | ICD-10-CM | POA: Diagnosis not present

## 2017-01-06 DIAGNOSIS — R8299 Other abnormal findings in urine: Secondary | ICD-10-CM | POA: Diagnosis not present

## 2017-01-07 DIAGNOSIS — N186 End stage renal disease: Secondary | ICD-10-CM | POA: Diagnosis not present

## 2017-01-07 DIAGNOSIS — E440 Moderate protein-calorie malnutrition: Secondary | ICD-10-CM | POA: Diagnosis not present

## 2017-01-07 DIAGNOSIS — R8299 Other abnormal findings in urine: Secondary | ICD-10-CM | POA: Diagnosis not present

## 2017-01-07 DIAGNOSIS — D509 Iron deficiency anemia, unspecified: Secondary | ICD-10-CM | POA: Diagnosis not present

## 2017-01-07 DIAGNOSIS — Z79899 Other long term (current) drug therapy: Secondary | ICD-10-CM | POA: Diagnosis not present

## 2017-01-07 DIAGNOSIS — D631 Anemia in chronic kidney disease: Secondary | ICD-10-CM | POA: Diagnosis not present

## 2017-01-07 DIAGNOSIS — N2581 Secondary hyperparathyroidism of renal origin: Secondary | ICD-10-CM | POA: Diagnosis not present

## 2017-01-07 DIAGNOSIS — R17 Unspecified jaundice: Secondary | ICD-10-CM | POA: Diagnosis not present

## 2017-01-08 DIAGNOSIS — R8299 Other abnormal findings in urine: Secondary | ICD-10-CM | POA: Diagnosis not present

## 2017-01-08 DIAGNOSIS — Z79899 Other long term (current) drug therapy: Secondary | ICD-10-CM | POA: Diagnosis not present

## 2017-01-08 DIAGNOSIS — R17 Unspecified jaundice: Secondary | ICD-10-CM | POA: Diagnosis not present

## 2017-01-08 DIAGNOSIS — N186 End stage renal disease: Secondary | ICD-10-CM | POA: Diagnosis not present

## 2017-01-08 DIAGNOSIS — D631 Anemia in chronic kidney disease: Secondary | ICD-10-CM | POA: Diagnosis not present

## 2017-01-08 DIAGNOSIS — D509 Iron deficiency anemia, unspecified: Secondary | ICD-10-CM | POA: Diagnosis not present

## 2017-01-08 DIAGNOSIS — E440 Moderate protein-calorie malnutrition: Secondary | ICD-10-CM | POA: Diagnosis not present

## 2017-01-08 DIAGNOSIS — N2581 Secondary hyperparathyroidism of renal origin: Secondary | ICD-10-CM | POA: Diagnosis not present

## 2017-01-09 DIAGNOSIS — R17 Unspecified jaundice: Secondary | ICD-10-CM | POA: Diagnosis not present

## 2017-01-09 DIAGNOSIS — E440 Moderate protein-calorie malnutrition: Secondary | ICD-10-CM | POA: Diagnosis not present

## 2017-01-09 DIAGNOSIS — D509 Iron deficiency anemia, unspecified: Secondary | ICD-10-CM | POA: Diagnosis not present

## 2017-01-09 DIAGNOSIS — Z79899 Other long term (current) drug therapy: Secondary | ICD-10-CM | POA: Diagnosis not present

## 2017-01-09 DIAGNOSIS — N2581 Secondary hyperparathyroidism of renal origin: Secondary | ICD-10-CM | POA: Diagnosis not present

## 2017-01-09 DIAGNOSIS — N186 End stage renal disease: Secondary | ICD-10-CM | POA: Diagnosis not present

## 2017-01-09 DIAGNOSIS — D631 Anemia in chronic kidney disease: Secondary | ICD-10-CM | POA: Diagnosis not present

## 2017-01-09 DIAGNOSIS — R8299 Other abnormal findings in urine: Secondary | ICD-10-CM | POA: Diagnosis not present

## 2017-01-10 DIAGNOSIS — E440 Moderate protein-calorie malnutrition: Secondary | ICD-10-CM | POA: Diagnosis not present

## 2017-01-10 DIAGNOSIS — R8299 Other abnormal findings in urine: Secondary | ICD-10-CM | POA: Diagnosis not present

## 2017-01-10 DIAGNOSIS — Z79899 Other long term (current) drug therapy: Secondary | ICD-10-CM | POA: Diagnosis not present

## 2017-01-10 DIAGNOSIS — D509 Iron deficiency anemia, unspecified: Secondary | ICD-10-CM | POA: Diagnosis not present

## 2017-01-10 DIAGNOSIS — R17 Unspecified jaundice: Secondary | ICD-10-CM | POA: Diagnosis not present

## 2017-01-10 DIAGNOSIS — D631 Anemia in chronic kidney disease: Secondary | ICD-10-CM | POA: Diagnosis not present

## 2017-01-10 DIAGNOSIS — N186 End stage renal disease: Secondary | ICD-10-CM | POA: Diagnosis not present

## 2017-01-10 DIAGNOSIS — N2581 Secondary hyperparathyroidism of renal origin: Secondary | ICD-10-CM | POA: Diagnosis not present

## 2017-01-11 DIAGNOSIS — N186 End stage renal disease: Secondary | ICD-10-CM | POA: Diagnosis not present

## 2017-01-11 DIAGNOSIS — D509 Iron deficiency anemia, unspecified: Secondary | ICD-10-CM | POA: Diagnosis not present

## 2017-01-11 DIAGNOSIS — D631 Anemia in chronic kidney disease: Secondary | ICD-10-CM | POA: Diagnosis not present

## 2017-01-11 DIAGNOSIS — R8299 Other abnormal findings in urine: Secondary | ICD-10-CM | POA: Diagnosis not present

## 2017-01-11 DIAGNOSIS — E440 Moderate protein-calorie malnutrition: Secondary | ICD-10-CM | POA: Diagnosis not present

## 2017-01-11 DIAGNOSIS — R17 Unspecified jaundice: Secondary | ICD-10-CM | POA: Diagnosis not present

## 2017-01-11 DIAGNOSIS — Z79899 Other long term (current) drug therapy: Secondary | ICD-10-CM | POA: Diagnosis not present

## 2017-01-11 DIAGNOSIS — N2581 Secondary hyperparathyroidism of renal origin: Secondary | ICD-10-CM | POA: Diagnosis not present

## 2017-01-12 DIAGNOSIS — D509 Iron deficiency anemia, unspecified: Secondary | ICD-10-CM | POA: Diagnosis not present

## 2017-01-12 DIAGNOSIS — N2581 Secondary hyperparathyroidism of renal origin: Secondary | ICD-10-CM | POA: Diagnosis not present

## 2017-01-12 DIAGNOSIS — D631 Anemia in chronic kidney disease: Secondary | ICD-10-CM | POA: Diagnosis not present

## 2017-01-12 DIAGNOSIS — N186 End stage renal disease: Secondary | ICD-10-CM | POA: Diagnosis not present

## 2017-01-12 DIAGNOSIS — R8299 Other abnormal findings in urine: Secondary | ICD-10-CM | POA: Diagnosis not present

## 2017-01-12 DIAGNOSIS — Z79899 Other long term (current) drug therapy: Secondary | ICD-10-CM | POA: Diagnosis not present

## 2017-01-12 DIAGNOSIS — R17 Unspecified jaundice: Secondary | ICD-10-CM | POA: Diagnosis not present

## 2017-01-12 DIAGNOSIS — E440 Moderate protein-calorie malnutrition: Secondary | ICD-10-CM | POA: Diagnosis not present

## 2017-01-13 DIAGNOSIS — D509 Iron deficiency anemia, unspecified: Secondary | ICD-10-CM | POA: Diagnosis not present

## 2017-01-13 DIAGNOSIS — R8299 Other abnormal findings in urine: Secondary | ICD-10-CM | POA: Diagnosis not present

## 2017-01-13 DIAGNOSIS — E440 Moderate protein-calorie malnutrition: Secondary | ICD-10-CM | POA: Diagnosis not present

## 2017-01-13 DIAGNOSIS — Z79899 Other long term (current) drug therapy: Secondary | ICD-10-CM | POA: Diagnosis not present

## 2017-01-13 DIAGNOSIS — N186 End stage renal disease: Secondary | ICD-10-CM | POA: Diagnosis not present

## 2017-01-13 DIAGNOSIS — N2581 Secondary hyperparathyroidism of renal origin: Secondary | ICD-10-CM | POA: Diagnosis not present

## 2017-01-13 DIAGNOSIS — R17 Unspecified jaundice: Secondary | ICD-10-CM | POA: Diagnosis not present

## 2017-01-13 DIAGNOSIS — D631 Anemia in chronic kidney disease: Secondary | ICD-10-CM | POA: Diagnosis not present

## 2017-01-14 DIAGNOSIS — R8299 Other abnormal findings in urine: Secondary | ICD-10-CM | POA: Diagnosis not present

## 2017-01-14 DIAGNOSIS — E440 Moderate protein-calorie malnutrition: Secondary | ICD-10-CM | POA: Diagnosis not present

## 2017-01-14 DIAGNOSIS — D631 Anemia in chronic kidney disease: Secondary | ICD-10-CM | POA: Diagnosis not present

## 2017-01-14 DIAGNOSIS — Z79899 Other long term (current) drug therapy: Secondary | ICD-10-CM | POA: Diagnosis not present

## 2017-01-14 DIAGNOSIS — R17 Unspecified jaundice: Secondary | ICD-10-CM | POA: Diagnosis not present

## 2017-01-14 DIAGNOSIS — D509 Iron deficiency anemia, unspecified: Secondary | ICD-10-CM | POA: Diagnosis not present

## 2017-01-14 DIAGNOSIS — N2581 Secondary hyperparathyroidism of renal origin: Secondary | ICD-10-CM | POA: Diagnosis not present

## 2017-01-14 DIAGNOSIS — N186 End stage renal disease: Secondary | ICD-10-CM | POA: Diagnosis not present

## 2017-01-15 DIAGNOSIS — N186 End stage renal disease: Secondary | ICD-10-CM | POA: Diagnosis not present

## 2017-01-15 DIAGNOSIS — D509 Iron deficiency anemia, unspecified: Secondary | ICD-10-CM | POA: Diagnosis not present

## 2017-01-15 DIAGNOSIS — Z79899 Other long term (current) drug therapy: Secondary | ICD-10-CM | POA: Diagnosis not present

## 2017-01-15 DIAGNOSIS — E440 Moderate protein-calorie malnutrition: Secondary | ICD-10-CM | POA: Diagnosis not present

## 2017-01-15 DIAGNOSIS — R17 Unspecified jaundice: Secondary | ICD-10-CM | POA: Diagnosis not present

## 2017-01-15 DIAGNOSIS — D631 Anemia in chronic kidney disease: Secondary | ICD-10-CM | POA: Diagnosis not present

## 2017-01-15 DIAGNOSIS — N2581 Secondary hyperparathyroidism of renal origin: Secondary | ICD-10-CM | POA: Diagnosis not present

## 2017-01-15 DIAGNOSIS — R8299 Other abnormal findings in urine: Secondary | ICD-10-CM | POA: Diagnosis not present

## 2017-01-16 DIAGNOSIS — N2581 Secondary hyperparathyroidism of renal origin: Secondary | ICD-10-CM | POA: Diagnosis not present

## 2017-01-16 DIAGNOSIS — D509 Iron deficiency anemia, unspecified: Secondary | ICD-10-CM | POA: Diagnosis not present

## 2017-01-16 DIAGNOSIS — N186 End stage renal disease: Secondary | ICD-10-CM | POA: Diagnosis not present

## 2017-01-16 DIAGNOSIS — R8299 Other abnormal findings in urine: Secondary | ICD-10-CM | POA: Diagnosis not present

## 2017-01-16 DIAGNOSIS — R17 Unspecified jaundice: Secondary | ICD-10-CM | POA: Diagnosis not present

## 2017-01-16 DIAGNOSIS — Z79899 Other long term (current) drug therapy: Secondary | ICD-10-CM | POA: Diagnosis not present

## 2017-01-16 DIAGNOSIS — E440 Moderate protein-calorie malnutrition: Secondary | ICD-10-CM | POA: Diagnosis not present

## 2017-01-16 DIAGNOSIS — D631 Anemia in chronic kidney disease: Secondary | ICD-10-CM | POA: Diagnosis not present

## 2017-01-17 DIAGNOSIS — E1122 Type 2 diabetes mellitus with diabetic chronic kidney disease: Secondary | ICD-10-CM | POA: Diagnosis not present

## 2017-01-17 DIAGNOSIS — R8299 Other abnormal findings in urine: Secondary | ICD-10-CM | POA: Diagnosis not present

## 2017-01-17 DIAGNOSIS — Z992 Dependence on renal dialysis: Secondary | ICD-10-CM | POA: Diagnosis not present

## 2017-01-17 DIAGNOSIS — Z79899 Other long term (current) drug therapy: Secondary | ICD-10-CM | POA: Diagnosis not present

## 2017-01-17 DIAGNOSIS — D631 Anemia in chronic kidney disease: Secondary | ICD-10-CM | POA: Diagnosis not present

## 2017-01-17 DIAGNOSIS — N186 End stage renal disease: Secondary | ICD-10-CM | POA: Diagnosis not present

## 2017-01-17 DIAGNOSIS — E440 Moderate protein-calorie malnutrition: Secondary | ICD-10-CM | POA: Diagnosis not present

## 2017-01-17 DIAGNOSIS — R17 Unspecified jaundice: Secondary | ICD-10-CM | POA: Diagnosis not present

## 2017-01-17 DIAGNOSIS — D509 Iron deficiency anemia, unspecified: Secondary | ICD-10-CM | POA: Diagnosis not present

## 2017-01-17 DIAGNOSIS — N2581 Secondary hyperparathyroidism of renal origin: Secondary | ICD-10-CM | POA: Diagnosis not present

## 2017-01-18 DIAGNOSIS — R8299 Other abnormal findings in urine: Secondary | ICD-10-CM | POA: Diagnosis not present

## 2017-01-18 DIAGNOSIS — Z79899 Other long term (current) drug therapy: Secondary | ICD-10-CM | POA: Diagnosis not present

## 2017-01-18 DIAGNOSIS — N2581 Secondary hyperparathyroidism of renal origin: Secondary | ICD-10-CM | POA: Diagnosis not present

## 2017-01-18 DIAGNOSIS — D631 Anemia in chronic kidney disease: Secondary | ICD-10-CM | POA: Diagnosis not present

## 2017-01-18 DIAGNOSIS — E440 Moderate protein-calorie malnutrition: Secondary | ICD-10-CM | POA: Diagnosis not present

## 2017-01-18 DIAGNOSIS — R17 Unspecified jaundice: Secondary | ICD-10-CM | POA: Diagnosis not present

## 2017-01-18 DIAGNOSIS — N186 End stage renal disease: Secondary | ICD-10-CM | POA: Diagnosis not present

## 2017-01-18 DIAGNOSIS — D509 Iron deficiency anemia, unspecified: Secondary | ICD-10-CM | POA: Diagnosis not present

## 2017-01-19 DIAGNOSIS — N2581 Secondary hyperparathyroidism of renal origin: Secondary | ICD-10-CM | POA: Diagnosis not present

## 2017-01-19 DIAGNOSIS — D631 Anemia in chronic kidney disease: Secondary | ICD-10-CM | POA: Diagnosis not present

## 2017-01-19 DIAGNOSIS — R17 Unspecified jaundice: Secondary | ICD-10-CM | POA: Diagnosis not present

## 2017-01-19 DIAGNOSIS — Z79899 Other long term (current) drug therapy: Secondary | ICD-10-CM | POA: Diagnosis not present

## 2017-01-19 DIAGNOSIS — E440 Moderate protein-calorie malnutrition: Secondary | ICD-10-CM | POA: Diagnosis not present

## 2017-01-19 DIAGNOSIS — R8299 Other abnormal findings in urine: Secondary | ICD-10-CM | POA: Diagnosis not present

## 2017-01-19 DIAGNOSIS — N186 End stage renal disease: Secondary | ICD-10-CM | POA: Diagnosis not present

## 2017-01-19 DIAGNOSIS — D509 Iron deficiency anemia, unspecified: Secondary | ICD-10-CM | POA: Diagnosis not present

## 2017-01-20 DIAGNOSIS — R8299 Other abnormal findings in urine: Secondary | ICD-10-CM | POA: Diagnosis not present

## 2017-01-20 DIAGNOSIS — R17 Unspecified jaundice: Secondary | ICD-10-CM | POA: Diagnosis not present

## 2017-01-20 DIAGNOSIS — N186 End stage renal disease: Secondary | ICD-10-CM | POA: Diagnosis not present

## 2017-01-20 DIAGNOSIS — N2581 Secondary hyperparathyroidism of renal origin: Secondary | ICD-10-CM | POA: Diagnosis not present

## 2017-01-20 DIAGNOSIS — D509 Iron deficiency anemia, unspecified: Secondary | ICD-10-CM | POA: Diagnosis not present

## 2017-01-20 DIAGNOSIS — Z79899 Other long term (current) drug therapy: Secondary | ICD-10-CM | POA: Diagnosis not present

## 2017-01-20 DIAGNOSIS — E440 Moderate protein-calorie malnutrition: Secondary | ICD-10-CM | POA: Diagnosis not present

## 2017-01-20 DIAGNOSIS — D631 Anemia in chronic kidney disease: Secondary | ICD-10-CM | POA: Diagnosis not present

## 2017-01-21 DIAGNOSIS — Z79899 Other long term (current) drug therapy: Secondary | ICD-10-CM | POA: Diagnosis not present

## 2017-01-21 DIAGNOSIS — N2581 Secondary hyperparathyroidism of renal origin: Secondary | ICD-10-CM | POA: Diagnosis not present

## 2017-01-21 DIAGNOSIS — E440 Moderate protein-calorie malnutrition: Secondary | ICD-10-CM | POA: Diagnosis not present

## 2017-01-21 DIAGNOSIS — D509 Iron deficiency anemia, unspecified: Secondary | ICD-10-CM | POA: Diagnosis not present

## 2017-01-21 DIAGNOSIS — N186 End stage renal disease: Secondary | ICD-10-CM | POA: Diagnosis not present

## 2017-01-21 DIAGNOSIS — D631 Anemia in chronic kidney disease: Secondary | ICD-10-CM | POA: Diagnosis not present

## 2017-01-21 DIAGNOSIS — R17 Unspecified jaundice: Secondary | ICD-10-CM | POA: Diagnosis not present

## 2017-01-21 DIAGNOSIS — R8299 Other abnormal findings in urine: Secondary | ICD-10-CM | POA: Diagnosis not present

## 2017-01-22 DIAGNOSIS — D631 Anemia in chronic kidney disease: Secondary | ICD-10-CM | POA: Diagnosis not present

## 2017-01-22 DIAGNOSIS — R8299 Other abnormal findings in urine: Secondary | ICD-10-CM | POA: Diagnosis not present

## 2017-01-22 DIAGNOSIS — N186 End stage renal disease: Secondary | ICD-10-CM | POA: Diagnosis not present

## 2017-01-22 DIAGNOSIS — Z79899 Other long term (current) drug therapy: Secondary | ICD-10-CM | POA: Diagnosis not present

## 2017-01-22 DIAGNOSIS — E440 Moderate protein-calorie malnutrition: Secondary | ICD-10-CM | POA: Diagnosis not present

## 2017-01-22 DIAGNOSIS — N2581 Secondary hyperparathyroidism of renal origin: Secondary | ICD-10-CM | POA: Diagnosis not present

## 2017-01-22 DIAGNOSIS — D509 Iron deficiency anemia, unspecified: Secondary | ICD-10-CM | POA: Diagnosis not present

## 2017-01-22 DIAGNOSIS — R17 Unspecified jaundice: Secondary | ICD-10-CM | POA: Diagnosis not present

## 2017-01-23 DIAGNOSIS — D509 Iron deficiency anemia, unspecified: Secondary | ICD-10-CM | POA: Diagnosis not present

## 2017-01-23 DIAGNOSIS — E440 Moderate protein-calorie malnutrition: Secondary | ICD-10-CM | POA: Diagnosis not present

## 2017-01-23 DIAGNOSIS — R17 Unspecified jaundice: Secondary | ICD-10-CM | POA: Diagnosis not present

## 2017-01-23 DIAGNOSIS — N2581 Secondary hyperparathyroidism of renal origin: Secondary | ICD-10-CM | POA: Diagnosis not present

## 2017-01-23 DIAGNOSIS — N186 End stage renal disease: Secondary | ICD-10-CM | POA: Diagnosis not present

## 2017-01-23 DIAGNOSIS — D631 Anemia in chronic kidney disease: Secondary | ICD-10-CM | POA: Diagnosis not present

## 2017-01-23 DIAGNOSIS — Z79899 Other long term (current) drug therapy: Secondary | ICD-10-CM | POA: Diagnosis not present

## 2017-01-23 DIAGNOSIS — R8299 Other abnormal findings in urine: Secondary | ICD-10-CM | POA: Diagnosis not present

## 2017-01-24 DIAGNOSIS — R8299 Other abnormal findings in urine: Secondary | ICD-10-CM | POA: Diagnosis not present

## 2017-01-24 DIAGNOSIS — R17 Unspecified jaundice: Secondary | ICD-10-CM | POA: Diagnosis not present

## 2017-01-24 DIAGNOSIS — N186 End stage renal disease: Secondary | ICD-10-CM | POA: Diagnosis not present

## 2017-01-24 DIAGNOSIS — E440 Moderate protein-calorie malnutrition: Secondary | ICD-10-CM | POA: Diagnosis not present

## 2017-01-24 DIAGNOSIS — N2581 Secondary hyperparathyroidism of renal origin: Secondary | ICD-10-CM | POA: Diagnosis not present

## 2017-01-24 DIAGNOSIS — Z79899 Other long term (current) drug therapy: Secondary | ICD-10-CM | POA: Diagnosis not present

## 2017-01-24 DIAGNOSIS — D509 Iron deficiency anemia, unspecified: Secondary | ICD-10-CM | POA: Diagnosis not present

## 2017-01-24 DIAGNOSIS — D631 Anemia in chronic kidney disease: Secondary | ICD-10-CM | POA: Diagnosis not present

## 2017-01-25 DIAGNOSIS — E113292 Type 2 diabetes mellitus with mild nonproliferative diabetic retinopathy without macular edema, left eye: Secondary | ICD-10-CM | POA: Diagnosis not present

## 2017-01-25 DIAGNOSIS — H40023 Open angle with borderline findings, high risk, bilateral: Secondary | ICD-10-CM | POA: Diagnosis not present

## 2017-01-25 DIAGNOSIS — Z79899 Other long term (current) drug therapy: Secondary | ICD-10-CM | POA: Diagnosis not present

## 2017-01-25 DIAGNOSIS — E113211 Type 2 diabetes mellitus with mild nonproliferative diabetic retinopathy with macular edema, right eye: Secondary | ICD-10-CM | POA: Diagnosis not present

## 2017-01-25 DIAGNOSIS — H35033 Hypertensive retinopathy, bilateral: Secondary | ICD-10-CM | POA: Diagnosis not present

## 2017-01-25 DIAGNOSIS — D509 Iron deficiency anemia, unspecified: Secondary | ICD-10-CM | POA: Diagnosis not present

## 2017-01-25 DIAGNOSIS — E440 Moderate protein-calorie malnutrition: Secondary | ICD-10-CM | POA: Diagnosis not present

## 2017-01-25 DIAGNOSIS — N2581 Secondary hyperparathyroidism of renal origin: Secondary | ICD-10-CM | POA: Diagnosis not present

## 2017-01-25 DIAGNOSIS — R17 Unspecified jaundice: Secondary | ICD-10-CM | POA: Diagnosis not present

## 2017-01-25 DIAGNOSIS — N186 End stage renal disease: Secondary | ICD-10-CM | POA: Diagnosis not present

## 2017-01-25 DIAGNOSIS — D631 Anemia in chronic kidney disease: Secondary | ICD-10-CM | POA: Diagnosis not present

## 2017-01-25 DIAGNOSIS — R8299 Other abnormal findings in urine: Secondary | ICD-10-CM | POA: Diagnosis not present

## 2017-01-26 DIAGNOSIS — R17 Unspecified jaundice: Secondary | ICD-10-CM | POA: Diagnosis not present

## 2017-01-26 DIAGNOSIS — D631 Anemia in chronic kidney disease: Secondary | ICD-10-CM | POA: Diagnosis not present

## 2017-01-26 DIAGNOSIS — E440 Moderate protein-calorie malnutrition: Secondary | ICD-10-CM | POA: Diagnosis not present

## 2017-01-26 DIAGNOSIS — N186 End stage renal disease: Secondary | ICD-10-CM | POA: Diagnosis not present

## 2017-01-26 DIAGNOSIS — Z79899 Other long term (current) drug therapy: Secondary | ICD-10-CM | POA: Diagnosis not present

## 2017-01-26 DIAGNOSIS — N2581 Secondary hyperparathyroidism of renal origin: Secondary | ICD-10-CM | POA: Diagnosis not present

## 2017-01-26 DIAGNOSIS — D509 Iron deficiency anemia, unspecified: Secondary | ICD-10-CM | POA: Diagnosis not present

## 2017-01-26 DIAGNOSIS — R8299 Other abnormal findings in urine: Secondary | ICD-10-CM | POA: Diagnosis not present

## 2017-01-27 DIAGNOSIS — E440 Moderate protein-calorie malnutrition: Secondary | ICD-10-CM | POA: Diagnosis not present

## 2017-01-27 DIAGNOSIS — N186 End stage renal disease: Secondary | ICD-10-CM | POA: Diagnosis not present

## 2017-01-27 DIAGNOSIS — D631 Anemia in chronic kidney disease: Secondary | ICD-10-CM | POA: Diagnosis not present

## 2017-01-27 DIAGNOSIS — R8299 Other abnormal findings in urine: Secondary | ICD-10-CM | POA: Diagnosis not present

## 2017-01-27 DIAGNOSIS — D509 Iron deficiency anemia, unspecified: Secondary | ICD-10-CM | POA: Diagnosis not present

## 2017-01-27 DIAGNOSIS — N2581 Secondary hyperparathyroidism of renal origin: Secondary | ICD-10-CM | POA: Diagnosis not present

## 2017-01-27 DIAGNOSIS — R17 Unspecified jaundice: Secondary | ICD-10-CM | POA: Diagnosis not present

## 2017-01-27 DIAGNOSIS — Z79899 Other long term (current) drug therapy: Secondary | ICD-10-CM | POA: Diagnosis not present

## 2017-01-28 DIAGNOSIS — R17 Unspecified jaundice: Secondary | ICD-10-CM | POA: Diagnosis not present

## 2017-01-28 DIAGNOSIS — D631 Anemia in chronic kidney disease: Secondary | ICD-10-CM | POA: Diagnosis not present

## 2017-01-28 DIAGNOSIS — N186 End stage renal disease: Secondary | ICD-10-CM | POA: Diagnosis not present

## 2017-01-28 DIAGNOSIS — E440 Moderate protein-calorie malnutrition: Secondary | ICD-10-CM | POA: Diagnosis not present

## 2017-01-28 DIAGNOSIS — N2581 Secondary hyperparathyroidism of renal origin: Secondary | ICD-10-CM | POA: Diagnosis not present

## 2017-01-28 DIAGNOSIS — D509 Iron deficiency anemia, unspecified: Secondary | ICD-10-CM | POA: Diagnosis not present

## 2017-01-28 DIAGNOSIS — R8299 Other abnormal findings in urine: Secondary | ICD-10-CM | POA: Diagnosis not present

## 2017-01-28 DIAGNOSIS — Z79899 Other long term (current) drug therapy: Secondary | ICD-10-CM | POA: Diagnosis not present

## 2017-01-29 DIAGNOSIS — E440 Moderate protein-calorie malnutrition: Secondary | ICD-10-CM | POA: Diagnosis not present

## 2017-01-29 DIAGNOSIS — D631 Anemia in chronic kidney disease: Secondary | ICD-10-CM | POA: Diagnosis not present

## 2017-01-29 DIAGNOSIS — R8299 Other abnormal findings in urine: Secondary | ICD-10-CM | POA: Diagnosis not present

## 2017-01-29 DIAGNOSIS — N186 End stage renal disease: Secondary | ICD-10-CM | POA: Diagnosis not present

## 2017-01-29 DIAGNOSIS — N2581 Secondary hyperparathyroidism of renal origin: Secondary | ICD-10-CM | POA: Diagnosis not present

## 2017-01-29 DIAGNOSIS — Z79899 Other long term (current) drug therapy: Secondary | ICD-10-CM | POA: Diagnosis not present

## 2017-01-29 DIAGNOSIS — R17 Unspecified jaundice: Secondary | ICD-10-CM | POA: Diagnosis not present

## 2017-01-29 DIAGNOSIS — D509 Iron deficiency anemia, unspecified: Secondary | ICD-10-CM | POA: Diagnosis not present

## 2017-01-30 DIAGNOSIS — D631 Anemia in chronic kidney disease: Secondary | ICD-10-CM | POA: Diagnosis not present

## 2017-01-30 DIAGNOSIS — Z79899 Other long term (current) drug therapy: Secondary | ICD-10-CM | POA: Diagnosis not present

## 2017-01-30 DIAGNOSIS — E440 Moderate protein-calorie malnutrition: Secondary | ICD-10-CM | POA: Diagnosis not present

## 2017-01-30 DIAGNOSIS — N186 End stage renal disease: Secondary | ICD-10-CM | POA: Diagnosis not present

## 2017-01-30 DIAGNOSIS — N2581 Secondary hyperparathyroidism of renal origin: Secondary | ICD-10-CM | POA: Diagnosis not present

## 2017-01-30 DIAGNOSIS — R17 Unspecified jaundice: Secondary | ICD-10-CM | POA: Diagnosis not present

## 2017-01-30 DIAGNOSIS — R8299 Other abnormal findings in urine: Secondary | ICD-10-CM | POA: Diagnosis not present

## 2017-01-30 DIAGNOSIS — D509 Iron deficiency anemia, unspecified: Secondary | ICD-10-CM | POA: Diagnosis not present

## 2017-01-31 ENCOUNTER — Encounter: Payer: Self-pay | Admitting: *Deleted

## 2017-01-31 DIAGNOSIS — D631 Anemia in chronic kidney disease: Secondary | ICD-10-CM | POA: Diagnosis not present

## 2017-01-31 DIAGNOSIS — E440 Moderate protein-calorie malnutrition: Secondary | ICD-10-CM | POA: Diagnosis not present

## 2017-01-31 DIAGNOSIS — Z79899 Other long term (current) drug therapy: Secondary | ICD-10-CM | POA: Diagnosis not present

## 2017-01-31 DIAGNOSIS — N2581 Secondary hyperparathyroidism of renal origin: Secondary | ICD-10-CM | POA: Diagnosis not present

## 2017-01-31 DIAGNOSIS — R8299 Other abnormal findings in urine: Secondary | ICD-10-CM | POA: Diagnosis not present

## 2017-01-31 DIAGNOSIS — N186 End stage renal disease: Secondary | ICD-10-CM | POA: Diagnosis not present

## 2017-01-31 DIAGNOSIS — D509 Iron deficiency anemia, unspecified: Secondary | ICD-10-CM | POA: Diagnosis not present

## 2017-01-31 DIAGNOSIS — R17 Unspecified jaundice: Secondary | ICD-10-CM | POA: Diagnosis not present

## 2017-02-01 DIAGNOSIS — D631 Anemia in chronic kidney disease: Secondary | ICD-10-CM | POA: Diagnosis not present

## 2017-02-01 DIAGNOSIS — D509 Iron deficiency anemia, unspecified: Secondary | ICD-10-CM | POA: Diagnosis not present

## 2017-02-01 DIAGNOSIS — R8299 Other abnormal findings in urine: Secondary | ICD-10-CM | POA: Diagnosis not present

## 2017-02-01 DIAGNOSIS — Z79899 Other long term (current) drug therapy: Secondary | ICD-10-CM | POA: Diagnosis not present

## 2017-02-01 DIAGNOSIS — E440 Moderate protein-calorie malnutrition: Secondary | ICD-10-CM | POA: Diagnosis not present

## 2017-02-01 DIAGNOSIS — R17 Unspecified jaundice: Secondary | ICD-10-CM | POA: Diagnosis not present

## 2017-02-01 DIAGNOSIS — N186 End stage renal disease: Secondary | ICD-10-CM | POA: Diagnosis not present

## 2017-02-01 DIAGNOSIS — N2581 Secondary hyperparathyroidism of renal origin: Secondary | ICD-10-CM | POA: Diagnosis not present

## 2017-02-02 DIAGNOSIS — E440 Moderate protein-calorie malnutrition: Secondary | ICD-10-CM | POA: Diagnosis not present

## 2017-02-02 DIAGNOSIS — R17 Unspecified jaundice: Secondary | ICD-10-CM | POA: Diagnosis not present

## 2017-02-02 DIAGNOSIS — Z79899 Other long term (current) drug therapy: Secondary | ICD-10-CM | POA: Diagnosis not present

## 2017-02-02 DIAGNOSIS — D509 Iron deficiency anemia, unspecified: Secondary | ICD-10-CM | POA: Diagnosis not present

## 2017-02-02 DIAGNOSIS — N2581 Secondary hyperparathyroidism of renal origin: Secondary | ICD-10-CM | POA: Diagnosis not present

## 2017-02-02 DIAGNOSIS — R8299 Other abnormal findings in urine: Secondary | ICD-10-CM | POA: Diagnosis not present

## 2017-02-02 DIAGNOSIS — N186 End stage renal disease: Secondary | ICD-10-CM | POA: Diagnosis not present

## 2017-02-02 DIAGNOSIS — D631 Anemia in chronic kidney disease: Secondary | ICD-10-CM | POA: Diagnosis not present

## 2017-02-03 DIAGNOSIS — D631 Anemia in chronic kidney disease: Secondary | ICD-10-CM | POA: Diagnosis not present

## 2017-02-03 DIAGNOSIS — N2581 Secondary hyperparathyroidism of renal origin: Secondary | ICD-10-CM | POA: Diagnosis not present

## 2017-02-03 DIAGNOSIS — Z79899 Other long term (current) drug therapy: Secondary | ICD-10-CM | POA: Diagnosis not present

## 2017-02-03 DIAGNOSIS — R17 Unspecified jaundice: Secondary | ICD-10-CM | POA: Diagnosis not present

## 2017-02-03 DIAGNOSIS — D509 Iron deficiency anemia, unspecified: Secondary | ICD-10-CM | POA: Diagnosis not present

## 2017-02-03 DIAGNOSIS — E440 Moderate protein-calorie malnutrition: Secondary | ICD-10-CM | POA: Diagnosis not present

## 2017-02-03 DIAGNOSIS — R8299 Other abnormal findings in urine: Secondary | ICD-10-CM | POA: Diagnosis not present

## 2017-02-03 DIAGNOSIS — N186 End stage renal disease: Secondary | ICD-10-CM | POA: Diagnosis not present

## 2017-02-04 DIAGNOSIS — E440 Moderate protein-calorie malnutrition: Secondary | ICD-10-CM | POA: Diagnosis not present

## 2017-02-04 DIAGNOSIS — R8299 Other abnormal findings in urine: Secondary | ICD-10-CM | POA: Diagnosis not present

## 2017-02-04 DIAGNOSIS — N2581 Secondary hyperparathyroidism of renal origin: Secondary | ICD-10-CM | POA: Diagnosis not present

## 2017-02-04 DIAGNOSIS — Z79899 Other long term (current) drug therapy: Secondary | ICD-10-CM | POA: Diagnosis not present

## 2017-02-04 DIAGNOSIS — R17 Unspecified jaundice: Secondary | ICD-10-CM | POA: Diagnosis not present

## 2017-02-04 DIAGNOSIS — D631 Anemia in chronic kidney disease: Secondary | ICD-10-CM | POA: Diagnosis not present

## 2017-02-04 DIAGNOSIS — D509 Iron deficiency anemia, unspecified: Secondary | ICD-10-CM | POA: Diagnosis not present

## 2017-02-04 DIAGNOSIS — N186 End stage renal disease: Secondary | ICD-10-CM | POA: Diagnosis not present

## 2017-02-05 DIAGNOSIS — N2581 Secondary hyperparathyroidism of renal origin: Secondary | ICD-10-CM | POA: Diagnosis not present

## 2017-02-05 DIAGNOSIS — D509 Iron deficiency anemia, unspecified: Secondary | ICD-10-CM | POA: Diagnosis not present

## 2017-02-05 DIAGNOSIS — Z79899 Other long term (current) drug therapy: Secondary | ICD-10-CM | POA: Diagnosis not present

## 2017-02-05 DIAGNOSIS — R8299 Other abnormal findings in urine: Secondary | ICD-10-CM | POA: Diagnosis not present

## 2017-02-05 DIAGNOSIS — R17 Unspecified jaundice: Secondary | ICD-10-CM | POA: Diagnosis not present

## 2017-02-05 DIAGNOSIS — D631 Anemia in chronic kidney disease: Secondary | ICD-10-CM | POA: Diagnosis not present

## 2017-02-05 DIAGNOSIS — E440 Moderate protein-calorie malnutrition: Secondary | ICD-10-CM | POA: Diagnosis not present

## 2017-02-05 DIAGNOSIS — N186 End stage renal disease: Secondary | ICD-10-CM | POA: Diagnosis not present

## 2017-02-06 DIAGNOSIS — R8299 Other abnormal findings in urine: Secondary | ICD-10-CM | POA: Diagnosis not present

## 2017-02-06 DIAGNOSIS — R17 Unspecified jaundice: Secondary | ICD-10-CM | POA: Diagnosis not present

## 2017-02-06 DIAGNOSIS — Z79899 Other long term (current) drug therapy: Secondary | ICD-10-CM | POA: Diagnosis not present

## 2017-02-06 DIAGNOSIS — D509 Iron deficiency anemia, unspecified: Secondary | ICD-10-CM | POA: Diagnosis not present

## 2017-02-06 DIAGNOSIS — N186 End stage renal disease: Secondary | ICD-10-CM | POA: Diagnosis not present

## 2017-02-06 DIAGNOSIS — E440 Moderate protein-calorie malnutrition: Secondary | ICD-10-CM | POA: Diagnosis not present

## 2017-02-06 DIAGNOSIS — N2581 Secondary hyperparathyroidism of renal origin: Secondary | ICD-10-CM | POA: Diagnosis not present

## 2017-02-06 DIAGNOSIS — D631 Anemia in chronic kidney disease: Secondary | ICD-10-CM | POA: Diagnosis not present

## 2017-02-07 ENCOUNTER — Other Ambulatory Visit: Payer: Self-pay | Admitting: Internal Medicine

## 2017-02-07 DIAGNOSIS — N186 End stage renal disease: Secondary | ICD-10-CM | POA: Diagnosis not present

## 2017-02-07 DIAGNOSIS — E440 Moderate protein-calorie malnutrition: Secondary | ICD-10-CM | POA: Diagnosis not present

## 2017-02-07 DIAGNOSIS — N2581 Secondary hyperparathyroidism of renal origin: Secondary | ICD-10-CM | POA: Diagnosis not present

## 2017-02-07 DIAGNOSIS — Z79899 Other long term (current) drug therapy: Secondary | ICD-10-CM | POA: Diagnosis not present

## 2017-02-07 DIAGNOSIS — D631 Anemia in chronic kidney disease: Secondary | ICD-10-CM | POA: Diagnosis not present

## 2017-02-07 DIAGNOSIS — R8299 Other abnormal findings in urine: Secondary | ICD-10-CM | POA: Diagnosis not present

## 2017-02-07 DIAGNOSIS — D509 Iron deficiency anemia, unspecified: Secondary | ICD-10-CM | POA: Diagnosis not present

## 2017-02-07 DIAGNOSIS — Z1231 Encounter for screening mammogram for malignant neoplasm of breast: Secondary | ICD-10-CM

## 2017-02-07 DIAGNOSIS — R17 Unspecified jaundice: Secondary | ICD-10-CM | POA: Diagnosis not present

## 2017-02-08 DIAGNOSIS — E440 Moderate protein-calorie malnutrition: Secondary | ICD-10-CM | POA: Diagnosis not present

## 2017-02-08 DIAGNOSIS — D631 Anemia in chronic kidney disease: Secondary | ICD-10-CM | POA: Diagnosis not present

## 2017-02-08 DIAGNOSIS — Z79899 Other long term (current) drug therapy: Secondary | ICD-10-CM | POA: Diagnosis not present

## 2017-02-08 DIAGNOSIS — N186 End stage renal disease: Secondary | ICD-10-CM | POA: Diagnosis not present

## 2017-02-08 DIAGNOSIS — R8299 Other abnormal findings in urine: Secondary | ICD-10-CM | POA: Diagnosis not present

## 2017-02-08 DIAGNOSIS — D509 Iron deficiency anemia, unspecified: Secondary | ICD-10-CM | POA: Diagnosis not present

## 2017-02-08 DIAGNOSIS — N2581 Secondary hyperparathyroidism of renal origin: Secondary | ICD-10-CM | POA: Diagnosis not present

## 2017-02-08 DIAGNOSIS — R17 Unspecified jaundice: Secondary | ICD-10-CM | POA: Diagnosis not present

## 2017-02-09 ENCOUNTER — Ambulatory Visit (INDEPENDENT_AMBULATORY_CARE_PROVIDER_SITE_OTHER): Payer: Medicare Other | Admitting: Ophthalmology

## 2017-02-09 DIAGNOSIS — Z79899 Other long term (current) drug therapy: Secondary | ICD-10-CM | POA: Diagnosis not present

## 2017-02-09 DIAGNOSIS — D509 Iron deficiency anemia, unspecified: Secondary | ICD-10-CM | POA: Diagnosis not present

## 2017-02-09 DIAGNOSIS — H43813 Vitreous degeneration, bilateral: Secondary | ICD-10-CM | POA: Diagnosis not present

## 2017-02-09 DIAGNOSIS — R8299 Other abnormal findings in urine: Secondary | ICD-10-CM | POA: Diagnosis not present

## 2017-02-09 DIAGNOSIS — E440 Moderate protein-calorie malnutrition: Secondary | ICD-10-CM | POA: Diagnosis not present

## 2017-02-09 DIAGNOSIS — R17 Unspecified jaundice: Secondary | ICD-10-CM | POA: Diagnosis not present

## 2017-02-09 DIAGNOSIS — I10 Essential (primary) hypertension: Secondary | ICD-10-CM

## 2017-02-09 DIAGNOSIS — E11319 Type 2 diabetes mellitus with unspecified diabetic retinopathy without macular edema: Secondary | ICD-10-CM | POA: Diagnosis not present

## 2017-02-09 DIAGNOSIS — E113311 Type 2 diabetes mellitus with moderate nonproliferative diabetic retinopathy with macular edema, right eye: Secondary | ICD-10-CM | POA: Diagnosis not present

## 2017-02-09 DIAGNOSIS — N186 End stage renal disease: Secondary | ICD-10-CM | POA: Diagnosis not present

## 2017-02-09 DIAGNOSIS — H35033 Hypertensive retinopathy, bilateral: Secondary | ICD-10-CM | POA: Diagnosis not present

## 2017-02-09 DIAGNOSIS — D631 Anemia in chronic kidney disease: Secondary | ICD-10-CM | POA: Diagnosis not present

## 2017-02-09 DIAGNOSIS — N2581 Secondary hyperparathyroidism of renal origin: Secondary | ICD-10-CM | POA: Diagnosis not present

## 2017-02-09 LAB — HM DIABETES EYE EXAM

## 2017-02-10 DIAGNOSIS — R8299 Other abnormal findings in urine: Secondary | ICD-10-CM | POA: Diagnosis not present

## 2017-02-10 DIAGNOSIS — N186 End stage renal disease: Secondary | ICD-10-CM | POA: Diagnosis not present

## 2017-02-10 DIAGNOSIS — R17 Unspecified jaundice: Secondary | ICD-10-CM | POA: Diagnosis not present

## 2017-02-10 DIAGNOSIS — E440 Moderate protein-calorie malnutrition: Secondary | ICD-10-CM | POA: Diagnosis not present

## 2017-02-10 DIAGNOSIS — Z79899 Other long term (current) drug therapy: Secondary | ICD-10-CM | POA: Diagnosis not present

## 2017-02-10 DIAGNOSIS — D631 Anemia in chronic kidney disease: Secondary | ICD-10-CM | POA: Diagnosis not present

## 2017-02-10 DIAGNOSIS — N2581 Secondary hyperparathyroidism of renal origin: Secondary | ICD-10-CM | POA: Diagnosis not present

## 2017-02-10 DIAGNOSIS — D509 Iron deficiency anemia, unspecified: Secondary | ICD-10-CM | POA: Diagnosis not present

## 2017-02-11 DIAGNOSIS — D631 Anemia in chronic kidney disease: Secondary | ICD-10-CM | POA: Diagnosis not present

## 2017-02-11 DIAGNOSIS — N2581 Secondary hyperparathyroidism of renal origin: Secondary | ICD-10-CM | POA: Diagnosis not present

## 2017-02-11 DIAGNOSIS — N186 End stage renal disease: Secondary | ICD-10-CM | POA: Diagnosis not present

## 2017-02-11 DIAGNOSIS — R17 Unspecified jaundice: Secondary | ICD-10-CM | POA: Diagnosis not present

## 2017-02-11 DIAGNOSIS — E440 Moderate protein-calorie malnutrition: Secondary | ICD-10-CM | POA: Diagnosis not present

## 2017-02-11 DIAGNOSIS — R8299 Other abnormal findings in urine: Secondary | ICD-10-CM | POA: Diagnosis not present

## 2017-02-11 DIAGNOSIS — Z79899 Other long term (current) drug therapy: Secondary | ICD-10-CM | POA: Diagnosis not present

## 2017-02-11 DIAGNOSIS — D509 Iron deficiency anemia, unspecified: Secondary | ICD-10-CM | POA: Diagnosis not present

## 2017-02-12 DIAGNOSIS — E440 Moderate protein-calorie malnutrition: Secondary | ICD-10-CM | POA: Diagnosis not present

## 2017-02-12 DIAGNOSIS — N186 End stage renal disease: Secondary | ICD-10-CM | POA: Diagnosis not present

## 2017-02-12 DIAGNOSIS — D509 Iron deficiency anemia, unspecified: Secondary | ICD-10-CM | POA: Diagnosis not present

## 2017-02-12 DIAGNOSIS — R17 Unspecified jaundice: Secondary | ICD-10-CM | POA: Diagnosis not present

## 2017-02-12 DIAGNOSIS — N2581 Secondary hyperparathyroidism of renal origin: Secondary | ICD-10-CM | POA: Diagnosis not present

## 2017-02-12 DIAGNOSIS — R8299 Other abnormal findings in urine: Secondary | ICD-10-CM | POA: Diagnosis not present

## 2017-02-12 DIAGNOSIS — Z79899 Other long term (current) drug therapy: Secondary | ICD-10-CM | POA: Diagnosis not present

## 2017-02-12 DIAGNOSIS — D631 Anemia in chronic kidney disease: Secondary | ICD-10-CM | POA: Diagnosis not present

## 2017-02-13 DIAGNOSIS — D509 Iron deficiency anemia, unspecified: Secondary | ICD-10-CM | POA: Diagnosis not present

## 2017-02-13 DIAGNOSIS — E440 Moderate protein-calorie malnutrition: Secondary | ICD-10-CM | POA: Diagnosis not present

## 2017-02-13 DIAGNOSIS — N2581 Secondary hyperparathyroidism of renal origin: Secondary | ICD-10-CM | POA: Diagnosis not present

## 2017-02-13 DIAGNOSIS — R17 Unspecified jaundice: Secondary | ICD-10-CM | POA: Diagnosis not present

## 2017-02-13 DIAGNOSIS — Z79899 Other long term (current) drug therapy: Secondary | ICD-10-CM | POA: Diagnosis not present

## 2017-02-13 DIAGNOSIS — D631 Anemia in chronic kidney disease: Secondary | ICD-10-CM | POA: Diagnosis not present

## 2017-02-13 DIAGNOSIS — R8299 Other abnormal findings in urine: Secondary | ICD-10-CM | POA: Diagnosis not present

## 2017-02-13 DIAGNOSIS — N186 End stage renal disease: Secondary | ICD-10-CM | POA: Diagnosis not present

## 2017-02-14 ENCOUNTER — Other Ambulatory Visit (INDEPENDENT_AMBULATORY_CARE_PROVIDER_SITE_OTHER): Payer: Self-pay | Admitting: Vascular Surgery

## 2017-02-14 ENCOUNTER — Ambulatory Visit
Admission: RE | Admit: 2017-02-14 | Discharge: 2017-02-14 | Disposition: A | Discharge status: Home / Self Care | Payer: Medicare Other | Source: Ambulatory Visit | Attending: Nephrology | Admitting: Nephrology

## 2017-02-14 ENCOUNTER — Other Ambulatory Visit: Payer: Self-pay | Admitting: Nephrology

## 2017-02-14 DIAGNOSIS — D631 Anemia in chronic kidney disease: Secondary | ICD-10-CM | POA: Diagnosis not present

## 2017-02-14 DIAGNOSIS — N186 End stage renal disease: Secondary | ICD-10-CM | POA: Diagnosis not present

## 2017-02-14 DIAGNOSIS — E440 Moderate protein-calorie malnutrition: Secondary | ICD-10-CM | POA: Diagnosis not present

## 2017-02-14 DIAGNOSIS — T85611A Breakdown (mechanical) of intraperitoneal dialysis catheter, initial encounter: Secondary | ICD-10-CM

## 2017-02-14 DIAGNOSIS — N2581 Secondary hyperparathyroidism of renal origin: Secondary | ICD-10-CM | POA: Diagnosis not present

## 2017-02-14 DIAGNOSIS — R17 Unspecified jaundice: Secondary | ICD-10-CM | POA: Diagnosis not present

## 2017-02-14 DIAGNOSIS — R8299 Other abnormal findings in urine: Secondary | ICD-10-CM | POA: Diagnosis not present

## 2017-02-14 DIAGNOSIS — Z79899 Other long term (current) drug therapy: Secondary | ICD-10-CM | POA: Diagnosis not present

## 2017-02-14 DIAGNOSIS — D509 Iron deficiency anemia, unspecified: Secondary | ICD-10-CM | POA: Diagnosis not present

## 2017-02-14 DIAGNOSIS — T85691A Other mechanical complication of intraperitoneal dialysis catheter, initial encounter: Secondary | ICD-10-CM | POA: Diagnosis not present

## 2017-02-15 ENCOUNTER — Encounter
Admission: RE | Disposition: A | Discharge status: Home / Self Care | Payer: Self-pay | Source: Ambulatory Visit | Attending: Vascular Surgery

## 2017-02-15 ENCOUNTER — Ambulatory Visit: Payer: Medicare Other | Admitting: Anesthesiology

## 2017-02-15 ENCOUNTER — Encounter: Payer: Self-pay | Admitting: *Deleted

## 2017-02-15 ENCOUNTER — Ambulatory Visit
Admission: RE | Admit: 2017-02-15 | Discharge: 2017-02-15 | Disposition: A | Discharge status: Home / Self Care | Payer: Medicare Other | Source: Ambulatory Visit | Attending: Vascular Surgery | Admitting: Vascular Surgery

## 2017-02-15 DIAGNOSIS — D649 Anemia, unspecified: Secondary | ICD-10-CM | POA: Diagnosis not present

## 2017-02-15 DIAGNOSIS — Z96641 Presence of right artificial hip joint: Secondary | ICD-10-CM | POA: Diagnosis not present

## 2017-02-15 DIAGNOSIS — R8299 Other abnormal findings in urine: Secondary | ICD-10-CM | POA: Diagnosis not present

## 2017-02-15 DIAGNOSIS — N186 End stage renal disease: Secondary | ICD-10-CM | POA: Insufficient documentation

## 2017-02-15 DIAGNOSIS — T85611A Breakdown (mechanical) of intraperitoneal dialysis catheter, initial encounter: Secondary | ICD-10-CM | POA: Insufficient documentation

## 2017-02-15 DIAGNOSIS — M109 Gout, unspecified: Secondary | ICD-10-CM | POA: Insufficient documentation

## 2017-02-15 DIAGNOSIS — Y841 Kidney dialysis as the cause of abnormal reaction of the patient, or of later complication, without mention of misadventure at the time of the procedure: Secondary | ICD-10-CM | POA: Diagnosis not present

## 2017-02-15 DIAGNOSIS — I120 Hypertensive chronic kidney disease with stage 5 chronic kidney disease or end stage renal disease: Secondary | ICD-10-CM | POA: Insufficient documentation

## 2017-02-15 DIAGNOSIS — D631 Anemia in chronic kidney disease: Secondary | ICD-10-CM | POA: Diagnosis not present

## 2017-02-15 DIAGNOSIS — Z886 Allergy status to analgesic agent status: Secondary | ICD-10-CM | POA: Insufficient documentation

## 2017-02-15 DIAGNOSIS — I10 Essential (primary) hypertension: Secondary | ICD-10-CM | POA: Diagnosis not present

## 2017-02-15 DIAGNOSIS — E785 Hyperlipidemia, unspecified: Secondary | ICD-10-CM | POA: Insufficient documentation

## 2017-02-15 DIAGNOSIS — Z7982 Long term (current) use of aspirin: Secondary | ICD-10-CM | POA: Insufficient documentation

## 2017-02-15 DIAGNOSIS — Z79899 Other long term (current) drug therapy: Secondary | ICD-10-CM | POA: Diagnosis not present

## 2017-02-15 DIAGNOSIS — Z96651 Presence of right artificial knee joint: Secondary | ICD-10-CM | POA: Insufficient documentation

## 2017-02-15 DIAGNOSIS — E1122 Type 2 diabetes mellitus with diabetic chronic kidney disease: Secondary | ICD-10-CM | POA: Diagnosis not present

## 2017-02-15 DIAGNOSIS — E440 Moderate protein-calorie malnutrition: Secondary | ICD-10-CM | POA: Diagnosis not present

## 2017-02-15 DIAGNOSIS — E119 Type 2 diabetes mellitus without complications: Secondary | ICD-10-CM | POA: Diagnosis not present

## 2017-02-15 DIAGNOSIS — N2581 Secondary hyperparathyroidism of renal origin: Secondary | ICD-10-CM | POA: Diagnosis not present

## 2017-02-15 DIAGNOSIS — Z888 Allergy status to other drugs, medicaments and biological substances status: Secondary | ICD-10-CM | POA: Diagnosis not present

## 2017-02-15 DIAGNOSIS — D509 Iron deficiency anemia, unspecified: Secondary | ICD-10-CM | POA: Diagnosis not present

## 2017-02-15 DIAGNOSIS — R17 Unspecified jaundice: Secondary | ICD-10-CM | POA: Diagnosis not present

## 2017-02-15 DIAGNOSIS — T82868A Thrombosis of vascular prosthetic devices, implants and grafts, initial encounter: Secondary | ICD-10-CM | POA: Diagnosis not present

## 2017-02-15 HISTORY — PX: INSERTION OF DIALYSIS CATHETER: SHX1324

## 2017-02-15 LAB — CBC WITH DIFFERENTIAL/PLATELET
Basophils Absolute: 0 10*3/uL (ref 0–0.1)
Basophils Relative: 1 %
EOS ABS: 0.2 10*3/uL (ref 0–0.7)
EOS PCT: 3 %
HCT: 38.8 % (ref 35.0–47.0)
HEMOGLOBIN: 12.6 g/dL (ref 12.0–16.0)
LYMPHS ABS: 2.1 10*3/uL (ref 1.0–3.6)
Lymphocytes Relative: 28 %
MCH: 29.6 pg (ref 26.0–34.0)
MCHC: 32.5 g/dL (ref 32.0–36.0)
MCV: 91.4 fL (ref 80.0–100.0)
Monocytes Absolute: 0.4 10*3/uL (ref 0.2–0.9)
Monocytes Relative: 6 %
NEUTROS PCT: 62 %
Neutro Abs: 4.7 10*3/uL (ref 1.4–6.5)
PLATELETS: 179 10*3/uL (ref 150–440)
RBC: 4.25 MIL/uL (ref 3.80–5.20)
RDW: 16.8 % — ABNORMAL HIGH (ref 11.5–14.5)
WBC: 7.4 10*3/uL (ref 3.6–11.0)

## 2017-02-15 LAB — APTT: aPTT: 32 seconds (ref 24–36)

## 2017-02-15 LAB — BASIC METABOLIC PANEL
ANION GAP: 13 (ref 5–15)
BUN: 79 mg/dL — AB (ref 6–20)
CALCIUM: 9.4 mg/dL (ref 8.9–10.3)
CHLORIDE: 106 mmol/L (ref 101–111)
CO2: 23 mmol/L (ref 22–32)
CREATININE: 9.04 mg/dL — AB (ref 0.44–1.00)
GFR calc non Af Amer: 4 mL/min — ABNORMAL LOW (ref >60)
GFR, EST AFRICAN AMERICAN: 4 mL/min — AB (ref >60)
Glucose, Bld: 113 mg/dL — ABNORMAL HIGH (ref 65–99)
POTASSIUM: 3.9 mmol/L (ref 3.5–5.1)
Sodium: 142 mmol/L (ref 135–145)

## 2017-02-15 LAB — PROTIME-INR
INR: 1.09
PROTHROMBIN TIME: 14.1 s (ref 11.4–15.2)

## 2017-02-15 LAB — GLUCOSE, CAPILLARY
GLUCOSE-CAPILLARY: 96 mg/dL (ref 65–99)
Glucose-Capillary: 99 mg/dL (ref 65–99)

## 2017-02-15 SURGERY — LAPAROSCOPIC INSERTION CONTINUOUS AMBULATORY PERITONEAL DIALYSIS  (CAPD) CATHETER
Anesthesia: General

## 2017-02-15 SURGERY — INSERTION OF DIALYSIS CATHETER
Anesthesia: General | Wound class: Clean Contaminated

## 2017-02-15 MED ORDER — GLYCOPYRROLATE 0.2 MG/ML IJ SOLN
INTRAMUSCULAR | Status: DC | PRN
  Administered 2017-02-15: 0.4 mg via INTRAVENOUS

## 2017-02-15 MED ORDER — NEOSTIGMINE METHYLSULFATE 10 MG/10ML IV SOLN
INTRAVENOUS | Status: DC | PRN
  Administered 2017-02-15: 2 mg via INTRAVENOUS

## 2017-02-15 MED ORDER — FENTANYL CITRATE (PF) 100 MCG/2ML IJ SOLN: 25.0000 ug | INTRAMUSCULAR | Status: DC | PRN

## 2017-02-15 MED ORDER — FENTANYL CITRATE (PF) 100 MCG/2ML IJ SOLN
INTRAMUSCULAR | Status: AC
  Filled 2017-02-15: qty 2

## 2017-02-15 MED ORDER — HYDROCODONE-ACETAMINOPHEN 5-325 MG PO TABS: 1.0000 | ORAL_TABLET | Freq: Four times a day (QID) | ORAL | 0 refills | Status: DC | PRN

## 2017-02-15 MED ORDER — LIDOCAINE HCL (PF) 2 % IJ SOLN
INTRAMUSCULAR | Status: AC
  Filled 2017-02-15: qty 2

## 2017-02-15 MED ORDER — ONDANSETRON HCL 4 MG/2ML IJ SOLN
INTRAMUSCULAR | Status: AC
  Filled 2017-02-15: qty 2

## 2017-02-15 MED ORDER — ONDANSETRON HCL 4 MG/2ML IJ SOLN
INTRAMUSCULAR | Status: DC | PRN
  Administered 2017-02-15: 4 mg via INTRAVENOUS

## 2017-02-15 MED ORDER — ROCURONIUM BROMIDE 50 MG/5ML IV SOLN
INTRAVENOUS | Status: AC
  Filled 2017-02-15: qty 1

## 2017-02-15 MED ORDER — SODIUM CHLORIDE 0.9 % IV SOLN
INTRAVENOUS | Status: DC
  Administered 2017-02-15: 15:00:00 via INTRAVENOUS

## 2017-02-15 MED ORDER — ROCURONIUM BROMIDE 100 MG/10ML IV SOLN
INTRAVENOUS | Status: DC | PRN
  Administered 2017-02-15: 10 mg via INTRAVENOUS
  Administered 2017-02-15: 5 mg via INTRAVENOUS

## 2017-02-15 MED ORDER — ONDANSETRON HCL 4 MG/2ML IJ SOLN: 4.0000 mg | Freq: Once | INTRAMUSCULAR | Status: DC | PRN

## 2017-02-15 MED ORDER — CEFAZOLIN SODIUM-DEXTROSE 2-4 GM/100ML-% IV SOLN
INTRAVENOUS | Status: AC
  Filled 2017-02-15: qty 100

## 2017-02-15 MED ORDER — SUCCINYLCHOLINE CHLORIDE 20 MG/ML IJ SOLN
INTRAMUSCULAR | Status: AC
  Filled 2017-02-15: qty 1

## 2017-02-15 MED ORDER — FENTANYL CITRATE (PF) 100 MCG/2ML IJ SOLN
INTRAMUSCULAR | Status: DC | PRN
  Administered 2017-02-15 (×2): 50 ug via INTRAVENOUS

## 2017-02-15 MED ORDER — CEFAZOLIN SODIUM-DEXTROSE 2-4 GM/100ML-% IV SOLN
2.0000 g | INTRAVENOUS | Status: AC
  Administered 2017-02-15: 2 g via INTRAVENOUS

## 2017-02-15 MED ORDER — HYDROCODONE-ACETAMINOPHEN 5-325 MG PO TABS
1.0000 | ORAL_TABLET | Freq: Four times a day (QID) | ORAL | Status: DC | PRN
  Administered 2017-02-15: 1 via ORAL

## 2017-02-15 MED ORDER — PHENYLEPHRINE HCL 10 MG/ML IJ SOLN
INTRAMUSCULAR | Status: DC | PRN
  Administered 2017-02-15: 100 ug via INTRAVENOUS

## 2017-02-15 MED ORDER — HYDROCODONE-ACETAMINOPHEN 5-325 MG PO TABS
ORAL_TABLET | ORAL | Status: AC
  Filled 2017-02-15: qty 1

## 2017-02-15 MED ORDER — CHLORHEXIDINE GLUCONATE CLOTH 2 % EX PADS: 6.0000 | MEDICATED_PAD | Freq: Once | CUTANEOUS | Status: DC

## 2017-02-15 MED ORDER — PROPOFOL 10 MG/ML IV BOLUS
INTRAVENOUS | Status: AC
  Filled 2017-02-15: qty 20

## 2017-02-15 MED ORDER — SUCCINYLCHOLINE CHLORIDE 20 MG/ML IJ SOLN
INTRAMUSCULAR | Status: DC | PRN
  Administered 2017-02-15 (×2): 100 mg via INTRAVENOUS

## 2017-02-15 MED ORDER — PROPOFOL 10 MG/ML IV BOLUS
INTRAVENOUS | Status: DC | PRN
Start: 2017-02-15 — End: 2017-02-15
  Administered 2017-02-15: 70 mg via INTRAVENOUS
  Administered 2017-02-15: 130 mg via INTRAVENOUS

## 2017-02-15 MED ORDER — LIDOCAINE HCL (CARDIAC) 20 MG/ML IV SOLN
INTRAVENOUS | Status: DC | PRN
  Administered 2017-02-15: 70 mg via INTRAVENOUS

## 2017-02-15 SURGICAL SUPPLY — 34 items
ADAPTER BETA CAP QUINTON DIALY (ADAPTER) ×3 IMPLANT
ADAPTER CATH DIALYSIS 18.75 (CATHETERS) ×2 IMPLANT
ADAPTER CATH DIALYSIS 18.75CM (CATHETERS) ×1
ADH SKN CLS APL DERMABOND .7 (GAUZE/BANDAGES/DRESSINGS) ×1
ADPR DLYS BCP STRL PRTNL ULTEM (ADAPTER) ×1
CANISTER SUCT 1200ML W/VALVE (MISCELLANEOUS) ×3 IMPLANT
CATH DLYS SWAN NECK 62.5CM (CATHETERS) ×3 IMPLANT
CHLORAPREP W/TINT 26ML (MISCELLANEOUS) ×3 IMPLANT
DERMABOND ADVANCED (GAUZE/BANDAGES/DRESSINGS) ×2
DERMABOND ADVANCED .7 DNX12 (GAUZE/BANDAGES/DRESSINGS) ×1 IMPLANT
ELECT CAUTERY BLADE 6.4 (BLADE) ×3 IMPLANT
ELECT REM PT RETURN 9FT ADLT (ELECTROSURGICAL) ×3
ELECTRODE REM PT RTRN 9FT ADLT (ELECTROSURGICAL) ×1 IMPLANT
GLOVE BIO SURGEON STRL SZ7 (GLOVE) ×6 IMPLANT
GLOVE INDICATOR 7.5 STRL GRN (GLOVE) ×3 IMPLANT
GOWN STRL REUS W/ TWL LRG LVL3 (GOWN DISPOSABLE) ×2 IMPLANT
GOWN STRL REUS W/ TWL XL LVL3 (GOWN DISPOSABLE) ×1 IMPLANT
GOWN STRL REUS W/TWL LRG LVL3 (GOWN DISPOSABLE) ×6
GOWN STRL REUS W/TWL XL LVL3 (GOWN DISPOSABLE) ×3
IV NS 500ML (IV SOLUTION) ×3
IV NS 500ML BAXH (IV SOLUTION) ×1 IMPLANT
KIT RM TURNOVER STRD PROC AR (KITS) ×3 IMPLANT
LABEL OR SOLS (LABEL) ×3 IMPLANT
MINICAP W/POVIDONE IODINE SOL (MISCELLANEOUS) ×1 IMPLANT
PACK LAP CHOLECYSTECTOMY (MISCELLANEOUS) ×3 IMPLANT
PENCIL ELECTRO HAND CTR (MISCELLANEOUS) ×3 IMPLANT
SET CYSTO W/LG BORE CLAMP LF (SET/KITS/TRAYS/PACK) ×3 IMPLANT
SET TRANSFER 6 W/TWIST CLAMP 5 (SET/KITS/TRAYS/PACK) ×3 IMPLANT
SPONGE VERSALON 4X4 4PLY (MISCELLANEOUS) ×3 IMPLANT
SUT MNCRL AB 4-0 PS2 18 (SUTURE) ×3 IMPLANT
SUT VIC AB 2-0 UR6 27 (SUTURE) ×3 IMPLANT
SUT VICRYL+ 3-0 36IN CT-1 (SUTURE) ×3 IMPLANT
TROCAR XCEL NON-BLD 11X100MML (ENDOMECHANICALS) ×3 IMPLANT
TUBING INSUFFLATOR HI FLOW (MISCELLANEOUS) ×3 IMPLANT

## 2017-02-15 NOTE — Anesthesia Preprocedure Evaluation (Signed)
Anesthesia Evaluation  Patient identified by MRN, date of birth, ID band Patient awake    Reviewed: Allergy & Precautions, NPO status , Patient's Chart, lab work & pertinent test results, reviewed documented beta blocker date and time   Airway Mallampati: II   Neck ROM: Full    Dental  (+) Dental Advisory Given, Teeth Intact   Pulmonary neg pulmonary ROS,    breath sounds clear to auscultation       Cardiovascular hypertension, Pt. on medications  Rhythm:Regular  EKG OK, cleared by cardiology for this procedure   Neuro/Psych negative neurological ROS     GI/Hepatic hiatal hernia, GERD  Medicated,  Endo/Other  diabetes, Well Controlled, Type 2  Renal/GU Dialysis and CRFRenal diseaseChronic Renal insufficiency IV, Creat 3.86, K 6.0, 4.9 2 weeks ago, will need to recheck morning of surgery      Musculoskeletal   Abdominal (+)  Abdomen: soft.    Peds  Hematology  (+) anemia , 11/36, INR 1.06,    Anesthesia Other Findings   Reproductive/Obstetrics                             Anesthesia Physical  Anesthesia Plan  ASA: III  Anesthesia Plan: General   Post-op Pain Management:    Induction: Intravenous  Airway Management Planned: Oral ETT  Additional Equipment:   Intra-op Plan:   Post-operative Plan: Extubation in OR  Informed Consent: I have reviewed the patients History and Physical, chart, labs and discussed the procedure including the risks, benefits and alternatives for the proposed anesthesia with the patient or authorized representative who has indicated his/her understanding and acceptance.     Plan Discussed with: CRNA and Surgeon  Anesthesia Plan Comments:         Anesthesia Quick Evaluation

## 2017-02-15 NOTE — Transfer of Care (Signed)
Immediate Anesthesia Transfer of Care Note  Patient: Eileen Perez  Procedure(s) Performed: Procedure(s): REVISION OF DIALYSIS CATHETER (N/A)  Patient Location: PACU  Anesthesia Type:General  Level of Consciousness: awake  Airway & Oxygen Therapy: Patient connected to face mask oxygen  Post-op Assessment: Post -op Vital signs reviewed and stable  Post vital signs: stable  Last Vitals:  Vitals:   02/15/17 1416 02/15/17 1830  BP: (!) 202/89 (!) 158/77  Pulse: 81 87  Resp: 18 (!) 22  Temp: 36.8 C 36.6 C    Last Pain:  Vitals:   02/15/17 1830  TempSrc: Temporal  PainSc:          Complications: No apparent anesthesia complications

## 2017-02-15 NOTE — Op Note (Signed)
  OPERATIVE NOTE   PROCEDURE: 1. Laparoscopic peritoneal dialysis catheter revision 2. Laparoscopic lysis of adhesions  PRE-OPERATIVE DIAGNOSIS: 1. ESRD 2. Non-functional PD catheter  POST-OPERATIVE DIAGNOSIS: Same  SURGEON: Leotis Pain, MD  ASSISTANT(S): None  ANESTHESIA: general  ESTIMATED BLOOD LOSS: Minimal   FINDING(S): 1. moderate omental adhesions present, with the catheter pulled out of the pelvis and up to the mid anterior abdominal wall in these adhesions.  No signs of infection.   SPECIMEN(S): None  INDICATIONS:  Patient presents with ESRD and a non-functional PD catheter. The patient has decided to do peritoneal dialysis for his long-term dialysis and this catheter needs to be made functional for this. Risks and benefits of placement were discussed and he is agreeable to proceed.  Differences between peritoneal dialysis and hemodialysis were discussed.    DESCRIPTION: After obtaining full informed written consent, the patient was brought back to the operating room and placed supine upon the operating table. The patient received IV antibiotics prior to induction. After obtaining adequate anesthesia, the abdomen was prepped and draped in the standard fashion. I then entered the peritoneum with an 60mm Optiview trocar placed in the right upper quadrant and insufflated the abdomen with carbon dioxide. The abdomen was inspected and moderate omental adhesions were present, with the catheter pulled out of the pelvis and up to the mid anterior abdominal wall in these adhesions.  No signs of infection. I then placed a 5 mm Trocar in the left lower quadrant. A blunt grasper was placed through this trocar and the omental adhesions were carefully taken down to free the catheter.  Once freed, there were still some adhesions stuck to the catheter that were gently removed.  The catheter was then grasped with the blunt grasper and placed back into the pelvis under direct visualization.  The appropriate distal connectors were placed, and I then placed 500 cc of saline through the catheter into the pelvis. The abdomen was desufflated. Immediately, 450 cc of effluent returned through the catheter when the bag was placed to gravity. I took one more look with the camera to ensure that the catheter was in the pelvis and it was. The 5 mm trocar was removed. The 27mm trocar was then removed. I then closed the incisions with 2-0 Vicryl and 4-0 Monocryl and placed Dermabond as dressing. Dry dressing was placed around the catheter exit site. The patient was then awakened from anesthesia and taken to the recovery room in stable condition having tolerated the procedure well.  COMPLICATIONS: None  CONDITION: None  Leotis Pain, MD 02/15/2017 6:24 PM   This note was created with Dragon Medical transcription system. Any errors in dictation are purely unintentional.

## 2017-02-15 NOTE — Anesthesia Postprocedure Evaluation (Signed)
Anesthesia Post Note  Patient: Eileen Perez  Procedure(s) Performed: Procedure(s) (LRB): REVISION OF DIALYSIS CATHETER (N/A)  Patient location during evaluation: PACU Anesthesia Type: General Level of consciousness: awake and alert Pain management: pain level controlled Vital Signs Assessment: post-procedure vital signs reviewed and stable Respiratory status: spontaneous breathing and respiratory function stable Cardiovascular status: stable Anesthetic complications: no     Last Vitals:  Vitals:   02/15/17 1835 02/15/17 1840  BP:  (!) 163/86  Pulse: 84 81  Resp: 14 16  Temp:      Last Pain:  Vitals:   02/15/17 1830  TempSrc: Temporal  PainSc:                  KEPHART,WILLIAM K

## 2017-02-15 NOTE — H&P (Signed)
[facility] Admission History & Physical  MRN : [id number]  Eileen Perez is a 76 y.o. (06/10/1941) female who presents with chief complaint of No chief complaint on file. Marland Kitchen  History of Present Illness: Patient presents with a non-functional PD catheter.  She has been using this for many months.  She has no other complaints.  No fever or chills.  No chest pain or shortness of breath.    Current Facility-Administered Medications  Medication Dose Route Frequency Provider Last Rate Last Dose  . 0.9 %  sodium chloride infusion   Intravenous Continuous Alvin Critchley, MD 10 mL/hr at 02/15/17 1445    . ceFAZolin (ANCEF) 2-4 GM/100ML-% IVPB           . ceFAZolin (ANCEF) IVPB 2g/100 mL premix  2 g Intravenous On Call to [facility], PA-C      . Chlorhexidine Gluconate Cloth 2 % PADS 6 each  6 each Topical Once [employer], PA-C       And  . Chlorhexidine Gluconate Cloth 2 % PADS 6 each  6 each Topical Once Sela Hua, PA-C        Past Medical History:  Diagnosis Date  . Anemia   . Arthritis    Osteoarthritis  . Chronic kidney disease    Chronic renal insufficiency  . Diabetes mellitus    Pre  . Diverticulosis   . GERD (gastroesophageal reflux disease)   . Gout   . Hiatal hernia   . History of blood transfusion   . Hyperlipidemia   . Hypertension   . Osteopenia   . Pancreatic cyst 1999  . Thyroid disease    Hyperparathyroid     Past Surgical History:  Procedure Laterality Date  . CHOLECYSTECTOMY    . COLONOSCOPY  03/21/12   Next one in 2018  . EYE SURGERY Left    cataract extraction with IOL  . JOINT REPLACEMENT  2012   left knee  . left knee replacement     . PANCREATIC CYST EXCISION  1999  . TOTAL HIP ARTHROPLASTY Right 05/26/2015   Procedure: RIGHT TOTAL HIP ARTHROPLASTY ANTERIOR APPROACH;  Surgeon: Gaynelle Arabian, MD;  Location: [facility];  Service: Orthopedics;  Laterality: Right;  . TOTAL KNEE ARTHROPLASTY Right  06/09/2013   Procedure: RIGHT TOTAL KNEE ARTHROPLASTY;  Surgeon: Gearlean Alf, MD;  Location: [facility];  Service: Orthopedics;  Laterality: Right;    Social History Social History  Substance Use Topics  . Smoking status: Never Smoker  . Smokeless tobacco: Never Used  . Alcohol use No    Family History Family History  Problem Relation Age of Onset  . Cancer Father     Prostate and Throat  . Hypertension Mother   . Cancer Mother     Colon with METS  . Diabetes Mother     Allergies  Allergen Reactions  . Ace Inhibitors     unknown  . Nsaids     unknown     REVIEW OF SYSTEMS (Negative unless checked)  Constitutional: [] Weight loss  [] Fever  [] Chills Cardiac: [] Chest pain   [] Chest pressure   [] Palpitations   [] Shortness of breath when laying flat   [] Shortness of breath at rest   [x] Shortness of breath with exertion. Vascular:  [] Pain in legs with walking   [] Pain in legs at rest   [] Pain in legs when laying flat   [] Claudication   [] Pain in feet when walking  [] Pain in  feet at rest  [] Pain in feet when laying flat   [] History of DVT   [] Phlebitis   [] Swelling in legs   [] Varicose veins   [] Non-healing ulcers Pulmonary:   [] Uses home oxygen   [] Productive cough   [] Hemoptysis   [] Wheeze  [] COPD   [] Asthma Neurologic:  [] Dizziness  [] Blackouts   [] Seizures   [] History of stroke   [] History of TIA  [] Aphasia   [] Temporary blindness   [] Dysphagia   [] Weakness or numbness in arms   [] Weakness or numbness in legs Musculoskeletal:  [] Arthritis   [] Joint swelling   [] Joint pain   [] Low back pain Hematologic:  [] Easy bruising  [] Easy bleeding   [] Hypercoagulable state   [] Anemic  [] Hepatitis Gastrointestinal:  [] Blood in stool   [] Vomiting blood  [] Gastroesophageal reflux/heartburn   [] Difficulty swallowing. Genitourinary:  [x] Chronic kidney disease   [] Difficult urination  [] Frequent urination  [] Burning with urination   [] Blood in urine Skin:  [] Rashes   [] Ulcers    [] Wounds Psychological:  [] History of anxiety   []  History of major depression.  Physical Examination  Vitals:   02/15/17 1416  BP: (!) 202/89  Pulse: 81  Resp: 18  Temp: 98.3 F (36.8 C)  TempSrc: Oral  SpO2: 99%  Weight: 68.9 kg (152 lb)  Height: 5' 1.5" (1.562 m)   Body mass index is 28.26 kg/m. Gen: WD/WN, NAD. Appears younger than stated age. Head: [place]/AT, No temporalis wasting. Ear/Nose/Throat: Hearing grossly intact, nares w/o erythema or drainage, oropharynx w/o Erythema/Exudate,  Eyes: Conjunctiva clear, sclera non-icteric Neck: Trachea midline.  No JVD.  Pulmonary:  Good air movement, respirations not labored, no use of accessory muscles.  Cardiac: RRR Vascular:  Vessel Right Left  Radial Palpable Palpable                                   Gastrointestinal: PD catheter site C/D/I Musculoskeletal: M/S 5/5 throughout.  Extremities without ischemic changes.  No deformity or atrophy.  Neurologic: Sensation grossly intact in extremities.  Symmetrical.  Speech is fluent. Motor exam as listed above. Psychiatric: Judgment intact, Mood & affect appropriate for pt's clinical situation. Dermatologic: No rashes or ulcers noted.  No cellulitis or open wounds. Lymph : No Cervical, Axillary, or Inguinal lymphadenopathy.     CBC Lab Results  Component Value Date   WBC 7.4 02/15/2017   HGB 12.6 02/15/2017   HCT 38.8 02/15/2017   MCV 91.4 02/15/2017   PLT 179 02/15/2017    BMET    Component Value Date/Time   NA 142 02/15/2017 1323   K 3.9 02/15/2017 1323   CL 106 02/15/2017 1323   CO2 23 02/15/2017 1323   GLUCOSE 113 (H) 02/15/2017 1323   BUN 79 (H) 02/15/2017 1323   CREATININE 9.04 (H) 02/15/2017 1323   CREATININE 7.30 (H) 12/08/2016 1115   CALCIUM 9.4 02/15/2017 1323   CALCIUM 9.0 12/10/2015 0915   GFRNONAA 4 (L) 02/15/2017 1323   GFRNONAA 5 (L) 12/08/2016 1115   GFRAA 4 (L) 02/15/2017 1323   GFRAA 6 (L) 12/08/2016 1115   Estimated Creatinine  Clearance: 4.7 mL/min (A) (by C-G formula based on SCr of 9.04 mg/dL (H)).  COAG Lab Results  Component Value Date   INR 1.09 02/15/2017   INR 1.06 05/19/2015   INR 0.99 06/02/2013    Radiology Dg Abd 1 View  Result Date: 02/14/2017 CLINICAL DATA:  Peritoneal dialysis catheter dysfunction none  training for 2 days EXAM: ABDOMEN - 1 VIEW COMPARISON:  02/11/2010 FINDINGS: There is normal small bowel gas pattern. A coarse calcified uterine fibroid noted within pelvis measures 4.4 cm. Partially visualized right hip prosthesis. A left peritoneal dialysis catheter is noted with tip in right upper pelvis. There is acute complete angulation of the tip of the catheter with tip of the catheter being parallel with distal catheter. Highly suspicious for obstruction of the catheter and may explain the dysfunction. IMPRESSION: A left peritoneal dialysis catheter is noted with tip in right upper pelvis. There is acute complete angulation of the tip of the catheter with tip of the catheter being parallel with distal catheter. Highly suspicious for obstruction of the catheter and may explain the dysfunction. These results were called by telephone at the time of interpretation on 02/14/2017 at 11:35 am to Dr. Corliss Parish , who verbally acknowledged these results. Electronically Signed   By: Lahoma Crocker M.D.   On: 02/14/2017 11:36      Assessment/Plan 1. ESRD with nonfunctional PD catheter.  For revision today.  Risks and benefits discussed. 2. HTN. Stable on outpatient medications and blood pressure control important in reducing the progression of atherosclerotic disease. On appropriate oral medications. 3. DM. Stable on outpatient medications and blood glucose control important in reducing the progression of atherosclerotic disease. Also, involved in wound healing. On appropriate medications.    Leotis Pain, MD  02/15/2017 5:01 PM

## 2017-02-15 NOTE — Anesthesia Procedure Notes (Addendum)
Procedure Name: Intubation Date/Time: 02/15/2017 5:43 PM Performed by: Aline Brochure Pre-anesthesia Checklist: Patient identified, Emergency Drugs available, Suction available and Patient being monitored Patient Re-evaluated:Patient Re-evaluated prior to inductionOxygen Delivery Method: Circle system utilized Preoxygenation: Pre-oxygenation with 100% oxygen Intubation Type: IV induction Ventilation: Mask ventilation without difficulty Laryngoscope Size: McGraph and 4 Grade View: Grade II Tube type: Oral Tube size: 7.0 mm Number of attempts: 3 Airway Equipment and Method: Video-laryngoscopy and Bougie stylet Placement Confirmation: ETT inserted through vocal cords under direct vision,  positive ETCO2 and breath sounds checked- equal and bilateral Secured at: 21 cm Tube secured with: Tape Dental Injury: Teeth and Oropharynx as per pre-operative assessment and Injury to lip  Difficulty Due To: Difficult Airway- due to anterior larynx, Difficult Airway- due to immobile epiglottis, Difficult Airway- due to limited oral opening and Difficult Airway- due to reduced neck mobility

## 2017-02-15 NOTE — Anesthesia Post-op Follow-up Note (Cosign Needed)
Anesthesia QCDR form completed.        

## 2017-02-15 NOTE — Discharge Instructions (Signed)

## 2017-02-16 ENCOUNTER — Encounter: Payer: Self-pay | Admitting: Vascular Surgery

## 2017-02-16 DIAGNOSIS — R8299 Other abnormal findings in urine: Secondary | ICD-10-CM | POA: Diagnosis not present

## 2017-02-16 DIAGNOSIS — Z79899 Other long term (current) drug therapy: Secondary | ICD-10-CM | POA: Diagnosis not present

## 2017-02-16 DIAGNOSIS — E440 Moderate protein-calorie malnutrition: Secondary | ICD-10-CM | POA: Diagnosis not present

## 2017-02-16 DIAGNOSIS — D631 Anemia in chronic kidney disease: Secondary | ICD-10-CM | POA: Diagnosis not present

## 2017-02-16 DIAGNOSIS — N186 End stage renal disease: Secondary | ICD-10-CM | POA: Diagnosis not present

## 2017-02-16 DIAGNOSIS — D509 Iron deficiency anemia, unspecified: Secondary | ICD-10-CM | POA: Diagnosis not present

## 2017-02-16 DIAGNOSIS — R17 Unspecified jaundice: Secondary | ICD-10-CM | POA: Diagnosis not present

## 2017-02-16 DIAGNOSIS — N2581 Secondary hyperparathyroidism of renal origin: Secondary | ICD-10-CM | POA: Diagnosis not present

## 2017-02-16 LAB — TYPE AND SCREEN
ABO/RH(D): O POS
ANTIBODY SCREEN: NEGATIVE

## 2017-02-17 DIAGNOSIS — E1122 Type 2 diabetes mellitus with diabetic chronic kidney disease: Secondary | ICD-10-CM | POA: Diagnosis not present

## 2017-02-17 DIAGNOSIS — N2581 Secondary hyperparathyroidism of renal origin: Secondary | ICD-10-CM | POA: Diagnosis not present

## 2017-02-17 DIAGNOSIS — R8299 Other abnormal findings in urine: Secondary | ICD-10-CM | POA: Diagnosis not present

## 2017-02-17 DIAGNOSIS — Z79899 Other long term (current) drug therapy: Secondary | ICD-10-CM | POA: Diagnosis not present

## 2017-02-17 DIAGNOSIS — E440 Moderate protein-calorie malnutrition: Secondary | ICD-10-CM | POA: Diagnosis not present

## 2017-02-17 DIAGNOSIS — Z992 Dependence on renal dialysis: Secondary | ICD-10-CM | POA: Diagnosis not present

## 2017-02-17 DIAGNOSIS — N186 End stage renal disease: Secondary | ICD-10-CM | POA: Diagnosis not present

## 2017-02-17 DIAGNOSIS — R17 Unspecified jaundice: Secondary | ICD-10-CM | POA: Diagnosis not present

## 2017-02-17 DIAGNOSIS — D509 Iron deficiency anemia, unspecified: Secondary | ICD-10-CM | POA: Diagnosis not present

## 2017-02-17 DIAGNOSIS — D631 Anemia in chronic kidney disease: Secondary | ICD-10-CM | POA: Diagnosis not present

## 2017-02-18 DIAGNOSIS — E784 Other hyperlipidemia: Secondary | ICD-10-CM | POA: Diagnosis not present

## 2017-02-18 DIAGNOSIS — Z4932 Encounter for adequacy testing for peritoneal dialysis: Secondary | ICD-10-CM | POA: Diagnosis not present

## 2017-02-18 DIAGNOSIS — N2581 Secondary hyperparathyroidism of renal origin: Secondary | ICD-10-CM | POA: Diagnosis not present

## 2017-02-18 DIAGNOSIS — N186 End stage renal disease: Secondary | ICD-10-CM | POA: Diagnosis not present

## 2017-02-18 DIAGNOSIS — D509 Iron deficiency anemia, unspecified: Secondary | ICD-10-CM | POA: Diagnosis not present

## 2017-02-18 DIAGNOSIS — E119 Type 2 diabetes mellitus without complications: Secondary | ICD-10-CM | POA: Diagnosis not present

## 2017-02-18 DIAGNOSIS — N2589 Other disorders resulting from impaired renal tubular function: Secondary | ICD-10-CM | POA: Diagnosis not present

## 2017-02-18 DIAGNOSIS — D631 Anemia in chronic kidney disease: Secondary | ICD-10-CM | POA: Diagnosis not present

## 2017-02-18 DIAGNOSIS — R8299 Other abnormal findings in urine: Secondary | ICD-10-CM | POA: Diagnosis not present

## 2017-02-18 DIAGNOSIS — Z23 Encounter for immunization: Secondary | ICD-10-CM | POA: Diagnosis not present

## 2017-02-19 DIAGNOSIS — Z23 Encounter for immunization: Secondary | ICD-10-CM | POA: Diagnosis not present

## 2017-02-19 DIAGNOSIS — Z4932 Encounter for adequacy testing for peritoneal dialysis: Secondary | ICD-10-CM | POA: Diagnosis not present

## 2017-02-19 DIAGNOSIS — E784 Other hyperlipidemia: Secondary | ICD-10-CM | POA: Diagnosis not present

## 2017-02-19 DIAGNOSIS — D631 Anemia in chronic kidney disease: Secondary | ICD-10-CM | POA: Diagnosis not present

## 2017-02-19 DIAGNOSIS — D509 Iron deficiency anemia, unspecified: Secondary | ICD-10-CM | POA: Diagnosis not present

## 2017-02-19 DIAGNOSIS — E119 Type 2 diabetes mellitus without complications: Secondary | ICD-10-CM | POA: Diagnosis not present

## 2017-02-19 DIAGNOSIS — N186 End stage renal disease: Secondary | ICD-10-CM | POA: Diagnosis not present

## 2017-02-19 DIAGNOSIS — N2581 Secondary hyperparathyroidism of renal origin: Secondary | ICD-10-CM | POA: Diagnosis not present

## 2017-02-19 DIAGNOSIS — R8299 Other abnormal findings in urine: Secondary | ICD-10-CM | POA: Diagnosis not present

## 2017-02-19 DIAGNOSIS — N2589 Other disorders resulting from impaired renal tubular function: Secondary | ICD-10-CM | POA: Diagnosis not present

## 2017-02-20 DIAGNOSIS — Z23 Encounter for immunization: Secondary | ICD-10-CM | POA: Diagnosis not present

## 2017-02-20 DIAGNOSIS — Z4932 Encounter for adequacy testing for peritoneal dialysis: Secondary | ICD-10-CM | POA: Diagnosis not present

## 2017-02-20 DIAGNOSIS — D509 Iron deficiency anemia, unspecified: Secondary | ICD-10-CM | POA: Diagnosis not present

## 2017-02-20 DIAGNOSIS — E784 Other hyperlipidemia: Secondary | ICD-10-CM | POA: Diagnosis not present

## 2017-02-20 DIAGNOSIS — E119 Type 2 diabetes mellitus without complications: Secondary | ICD-10-CM | POA: Diagnosis not present

## 2017-02-20 DIAGNOSIS — R8299 Other abnormal findings in urine: Secondary | ICD-10-CM | POA: Diagnosis not present

## 2017-02-20 DIAGNOSIS — D631 Anemia in chronic kidney disease: Secondary | ICD-10-CM | POA: Diagnosis not present

## 2017-02-20 DIAGNOSIS — N186 End stage renal disease: Secondary | ICD-10-CM | POA: Diagnosis not present

## 2017-02-20 DIAGNOSIS — N2581 Secondary hyperparathyroidism of renal origin: Secondary | ICD-10-CM | POA: Diagnosis not present

## 2017-02-20 DIAGNOSIS — N2589 Other disorders resulting from impaired renal tubular function: Secondary | ICD-10-CM | POA: Diagnosis not present

## 2017-02-21 DIAGNOSIS — N2589 Other disorders resulting from impaired renal tubular function: Secondary | ICD-10-CM | POA: Diagnosis not present

## 2017-02-21 DIAGNOSIS — E119 Type 2 diabetes mellitus without complications: Secondary | ICD-10-CM | POA: Diagnosis not present

## 2017-02-21 DIAGNOSIS — N186 End stage renal disease: Secondary | ICD-10-CM | POA: Diagnosis not present

## 2017-02-21 DIAGNOSIS — E784 Other hyperlipidemia: Secondary | ICD-10-CM | POA: Diagnosis not present

## 2017-02-21 DIAGNOSIS — R8299 Other abnormal findings in urine: Secondary | ICD-10-CM | POA: Diagnosis not present

## 2017-02-21 DIAGNOSIS — D509 Iron deficiency anemia, unspecified: Secondary | ICD-10-CM | POA: Diagnosis not present

## 2017-02-21 DIAGNOSIS — Z4932 Encounter for adequacy testing for peritoneal dialysis: Secondary | ICD-10-CM | POA: Diagnosis not present

## 2017-02-21 DIAGNOSIS — N2581 Secondary hyperparathyroidism of renal origin: Secondary | ICD-10-CM | POA: Diagnosis not present

## 2017-02-21 DIAGNOSIS — Z23 Encounter for immunization: Secondary | ICD-10-CM | POA: Diagnosis not present

## 2017-02-21 DIAGNOSIS — D631 Anemia in chronic kidney disease: Secondary | ICD-10-CM | POA: Diagnosis not present

## 2017-02-22 DIAGNOSIS — R8299 Other abnormal findings in urine: Secondary | ICD-10-CM | POA: Diagnosis not present

## 2017-02-22 DIAGNOSIS — N2581 Secondary hyperparathyroidism of renal origin: Secondary | ICD-10-CM | POA: Diagnosis not present

## 2017-02-22 DIAGNOSIS — N2589 Other disorders resulting from impaired renal tubular function: Secondary | ICD-10-CM | POA: Diagnosis not present

## 2017-02-22 DIAGNOSIS — Z23 Encounter for immunization: Secondary | ICD-10-CM | POA: Diagnosis not present

## 2017-02-22 DIAGNOSIS — N186 End stage renal disease: Secondary | ICD-10-CM | POA: Diagnosis not present

## 2017-02-22 DIAGNOSIS — E119 Type 2 diabetes mellitus without complications: Secondary | ICD-10-CM | POA: Diagnosis not present

## 2017-02-22 DIAGNOSIS — E784 Other hyperlipidemia: Secondary | ICD-10-CM | POA: Diagnosis not present

## 2017-02-22 DIAGNOSIS — D631 Anemia in chronic kidney disease: Secondary | ICD-10-CM | POA: Diagnosis not present

## 2017-02-22 DIAGNOSIS — D509 Iron deficiency anemia, unspecified: Secondary | ICD-10-CM | POA: Diagnosis not present

## 2017-02-22 DIAGNOSIS — Z4932 Encounter for adequacy testing for peritoneal dialysis: Secondary | ICD-10-CM | POA: Diagnosis not present

## 2017-02-23 DIAGNOSIS — E784 Other hyperlipidemia: Secondary | ICD-10-CM | POA: Diagnosis not present

## 2017-02-23 DIAGNOSIS — D509 Iron deficiency anemia, unspecified: Secondary | ICD-10-CM | POA: Diagnosis not present

## 2017-02-23 DIAGNOSIS — N186 End stage renal disease: Secondary | ICD-10-CM | POA: Diagnosis not present

## 2017-02-23 DIAGNOSIS — N2581 Secondary hyperparathyroidism of renal origin: Secondary | ICD-10-CM | POA: Diagnosis not present

## 2017-02-23 DIAGNOSIS — D631 Anemia in chronic kidney disease: Secondary | ICD-10-CM | POA: Diagnosis not present

## 2017-02-23 DIAGNOSIS — E119 Type 2 diabetes mellitus without complications: Secondary | ICD-10-CM | POA: Diagnosis not present

## 2017-02-23 DIAGNOSIS — N2589 Other disorders resulting from impaired renal tubular function: Secondary | ICD-10-CM | POA: Diagnosis not present

## 2017-02-23 DIAGNOSIS — Z23 Encounter for immunization: Secondary | ICD-10-CM | POA: Diagnosis not present

## 2017-02-23 DIAGNOSIS — R8299 Other abnormal findings in urine: Secondary | ICD-10-CM | POA: Diagnosis not present

## 2017-02-23 DIAGNOSIS — Z4932 Encounter for adequacy testing for peritoneal dialysis: Secondary | ICD-10-CM | POA: Diagnosis not present

## 2017-02-24 DIAGNOSIS — N186 End stage renal disease: Secondary | ICD-10-CM | POA: Diagnosis not present

## 2017-02-24 DIAGNOSIS — D631 Anemia in chronic kidney disease: Secondary | ICD-10-CM | POA: Diagnosis not present

## 2017-02-24 DIAGNOSIS — D509 Iron deficiency anemia, unspecified: Secondary | ICD-10-CM | POA: Diagnosis not present

## 2017-02-24 DIAGNOSIS — N2581 Secondary hyperparathyroidism of renal origin: Secondary | ICD-10-CM | POA: Diagnosis not present

## 2017-02-24 DIAGNOSIS — N2589 Other disorders resulting from impaired renal tubular function: Secondary | ICD-10-CM | POA: Diagnosis not present

## 2017-02-24 DIAGNOSIS — E784 Other hyperlipidemia: Secondary | ICD-10-CM | POA: Diagnosis not present

## 2017-02-24 DIAGNOSIS — R8299 Other abnormal findings in urine: Secondary | ICD-10-CM | POA: Diagnosis not present

## 2017-02-24 DIAGNOSIS — Z23 Encounter for immunization: Secondary | ICD-10-CM | POA: Diagnosis not present

## 2017-02-24 DIAGNOSIS — Z4932 Encounter for adequacy testing for peritoneal dialysis: Secondary | ICD-10-CM | POA: Diagnosis not present

## 2017-02-24 DIAGNOSIS — E119 Type 2 diabetes mellitus without complications: Secondary | ICD-10-CM | POA: Diagnosis not present

## 2017-02-25 DIAGNOSIS — N186 End stage renal disease: Secondary | ICD-10-CM | POA: Diagnosis not present

## 2017-02-25 DIAGNOSIS — R8299 Other abnormal findings in urine: Secondary | ICD-10-CM | POA: Diagnosis not present

## 2017-02-25 DIAGNOSIS — D509 Iron deficiency anemia, unspecified: Secondary | ICD-10-CM | POA: Diagnosis not present

## 2017-02-25 DIAGNOSIS — Z23 Encounter for immunization: Secondary | ICD-10-CM | POA: Diagnosis not present

## 2017-02-25 DIAGNOSIS — E784 Other hyperlipidemia: Secondary | ICD-10-CM | POA: Diagnosis not present

## 2017-02-25 DIAGNOSIS — D631 Anemia in chronic kidney disease: Secondary | ICD-10-CM | POA: Diagnosis not present

## 2017-02-25 DIAGNOSIS — E119 Type 2 diabetes mellitus without complications: Secondary | ICD-10-CM | POA: Diagnosis not present

## 2017-02-25 DIAGNOSIS — N2581 Secondary hyperparathyroidism of renal origin: Secondary | ICD-10-CM | POA: Diagnosis not present

## 2017-02-25 DIAGNOSIS — N2589 Other disorders resulting from impaired renal tubular function: Secondary | ICD-10-CM | POA: Diagnosis not present

## 2017-02-25 DIAGNOSIS — Z4932 Encounter for adequacy testing for peritoneal dialysis: Secondary | ICD-10-CM | POA: Diagnosis not present

## 2017-02-26 DIAGNOSIS — R8299 Other abnormal findings in urine: Secondary | ICD-10-CM | POA: Diagnosis not present

## 2017-02-26 DIAGNOSIS — E784 Other hyperlipidemia: Secondary | ICD-10-CM | POA: Diagnosis not present

## 2017-02-26 DIAGNOSIS — Z23 Encounter for immunization: Secondary | ICD-10-CM | POA: Diagnosis not present

## 2017-02-26 DIAGNOSIS — D631 Anemia in chronic kidney disease: Secondary | ICD-10-CM | POA: Diagnosis not present

## 2017-02-26 DIAGNOSIS — N2581 Secondary hyperparathyroidism of renal origin: Secondary | ICD-10-CM | POA: Diagnosis not present

## 2017-02-26 DIAGNOSIS — D509 Iron deficiency anemia, unspecified: Secondary | ICD-10-CM | POA: Diagnosis not present

## 2017-02-26 DIAGNOSIS — N2589 Other disorders resulting from impaired renal tubular function: Secondary | ICD-10-CM | POA: Diagnosis not present

## 2017-02-26 DIAGNOSIS — N186 End stage renal disease: Secondary | ICD-10-CM | POA: Diagnosis not present

## 2017-02-26 DIAGNOSIS — Z4932 Encounter for adequacy testing for peritoneal dialysis: Secondary | ICD-10-CM | POA: Diagnosis not present

## 2017-02-26 DIAGNOSIS — E119 Type 2 diabetes mellitus without complications: Secondary | ICD-10-CM | POA: Diagnosis not present

## 2017-02-27 DIAGNOSIS — N2581 Secondary hyperparathyroidism of renal origin: Secondary | ICD-10-CM | POA: Diagnosis not present

## 2017-02-27 DIAGNOSIS — D631 Anemia in chronic kidney disease: Secondary | ICD-10-CM | POA: Diagnosis not present

## 2017-02-27 DIAGNOSIS — D509 Iron deficiency anemia, unspecified: Secondary | ICD-10-CM | POA: Diagnosis not present

## 2017-02-27 DIAGNOSIS — E784 Other hyperlipidemia: Secondary | ICD-10-CM | POA: Diagnosis not present

## 2017-02-27 DIAGNOSIS — R8299 Other abnormal findings in urine: Secondary | ICD-10-CM | POA: Diagnosis not present

## 2017-02-27 DIAGNOSIS — Z23 Encounter for immunization: Secondary | ICD-10-CM | POA: Diagnosis not present

## 2017-02-27 DIAGNOSIS — E119 Type 2 diabetes mellitus without complications: Secondary | ICD-10-CM | POA: Diagnosis not present

## 2017-02-27 DIAGNOSIS — N186 End stage renal disease: Secondary | ICD-10-CM | POA: Diagnosis not present

## 2017-02-27 DIAGNOSIS — N2589 Other disorders resulting from impaired renal tubular function: Secondary | ICD-10-CM | POA: Diagnosis not present

## 2017-02-27 DIAGNOSIS — Z4932 Encounter for adequacy testing for peritoneal dialysis: Secondary | ICD-10-CM | POA: Diagnosis not present

## 2017-02-28 DIAGNOSIS — N2589 Other disorders resulting from impaired renal tubular function: Secondary | ICD-10-CM | POA: Diagnosis not present

## 2017-02-28 DIAGNOSIS — E784 Other hyperlipidemia: Secondary | ICD-10-CM | POA: Diagnosis not present

## 2017-02-28 DIAGNOSIS — R8299 Other abnormal findings in urine: Secondary | ICD-10-CM | POA: Diagnosis not present

## 2017-02-28 DIAGNOSIS — D509 Iron deficiency anemia, unspecified: Secondary | ICD-10-CM | POA: Diagnosis not present

## 2017-02-28 DIAGNOSIS — Z23 Encounter for immunization: Secondary | ICD-10-CM | POA: Diagnosis not present

## 2017-02-28 DIAGNOSIS — Z4932 Encounter for adequacy testing for peritoneal dialysis: Secondary | ICD-10-CM | POA: Diagnosis not present

## 2017-02-28 DIAGNOSIS — D631 Anemia in chronic kidney disease: Secondary | ICD-10-CM | POA: Diagnosis not present

## 2017-02-28 DIAGNOSIS — N2581 Secondary hyperparathyroidism of renal origin: Secondary | ICD-10-CM | POA: Diagnosis not present

## 2017-02-28 DIAGNOSIS — N186 End stage renal disease: Secondary | ICD-10-CM | POA: Diagnosis not present

## 2017-02-28 DIAGNOSIS — E119 Type 2 diabetes mellitus without complications: Secondary | ICD-10-CM | POA: Diagnosis not present

## 2017-03-01 DIAGNOSIS — Z23 Encounter for immunization: Secondary | ICD-10-CM | POA: Diagnosis not present

## 2017-03-01 DIAGNOSIS — N186 End stage renal disease: Secondary | ICD-10-CM | POA: Diagnosis not present

## 2017-03-01 DIAGNOSIS — D631 Anemia in chronic kidney disease: Secondary | ICD-10-CM | POA: Diagnosis not present

## 2017-03-01 DIAGNOSIS — D509 Iron deficiency anemia, unspecified: Secondary | ICD-10-CM | POA: Diagnosis not present

## 2017-03-01 DIAGNOSIS — R8299 Other abnormal findings in urine: Secondary | ICD-10-CM | POA: Diagnosis not present

## 2017-03-01 DIAGNOSIS — E119 Type 2 diabetes mellitus without complications: Secondary | ICD-10-CM | POA: Diagnosis not present

## 2017-03-01 DIAGNOSIS — N2581 Secondary hyperparathyroidism of renal origin: Secondary | ICD-10-CM | POA: Diagnosis not present

## 2017-03-01 DIAGNOSIS — Z4932 Encounter for adequacy testing for peritoneal dialysis: Secondary | ICD-10-CM | POA: Diagnosis not present

## 2017-03-01 DIAGNOSIS — N2589 Other disorders resulting from impaired renal tubular function: Secondary | ICD-10-CM | POA: Diagnosis not present

## 2017-03-01 DIAGNOSIS — E784 Other hyperlipidemia: Secondary | ICD-10-CM | POA: Diagnosis not present

## 2017-03-02 ENCOUNTER — Ambulatory Visit
Admission: RE | Admit: 2017-03-02 | Discharge: 2017-03-02 | Disposition: A | Discharge status: Home / Self Care | Payer: Medicare Other | Source: Ambulatory Visit | Attending: Internal Medicine | Admitting: Internal Medicine

## 2017-03-02 DIAGNOSIS — Z23 Encounter for immunization: Secondary | ICD-10-CM | POA: Diagnosis not present

## 2017-03-02 DIAGNOSIS — D631 Anemia in chronic kidney disease: Secondary | ICD-10-CM | POA: Diagnosis not present

## 2017-03-02 DIAGNOSIS — D509 Iron deficiency anemia, unspecified: Secondary | ICD-10-CM | POA: Diagnosis not present

## 2017-03-02 DIAGNOSIS — E784 Other hyperlipidemia: Secondary | ICD-10-CM | POA: Diagnosis not present

## 2017-03-02 DIAGNOSIS — E119 Type 2 diabetes mellitus without complications: Secondary | ICD-10-CM | POA: Diagnosis not present

## 2017-03-02 DIAGNOSIS — R8299 Other abnormal findings in urine: Secondary | ICD-10-CM | POA: Diagnosis not present

## 2017-03-02 DIAGNOSIS — Z4932 Encounter for adequacy testing for peritoneal dialysis: Secondary | ICD-10-CM | POA: Diagnosis not present

## 2017-03-02 DIAGNOSIS — Z1231 Encounter for screening mammogram for malignant neoplasm of breast: Secondary | ICD-10-CM | POA: Diagnosis not present

## 2017-03-02 DIAGNOSIS — N186 End stage renal disease: Secondary | ICD-10-CM | POA: Diagnosis not present

## 2017-03-02 DIAGNOSIS — N2589 Other disorders resulting from impaired renal tubular function: Secondary | ICD-10-CM | POA: Diagnosis not present

## 2017-03-02 DIAGNOSIS — N2581 Secondary hyperparathyroidism of renal origin: Secondary | ICD-10-CM | POA: Diagnosis not present

## 2017-03-03 DIAGNOSIS — N186 End stage renal disease: Secondary | ICD-10-CM | POA: Diagnosis not present

## 2017-03-03 DIAGNOSIS — R8299 Other abnormal findings in urine: Secondary | ICD-10-CM | POA: Diagnosis not present

## 2017-03-03 DIAGNOSIS — E784 Other hyperlipidemia: Secondary | ICD-10-CM | POA: Diagnosis not present

## 2017-03-03 DIAGNOSIS — N2589 Other disorders resulting from impaired renal tubular function: Secondary | ICD-10-CM | POA: Diagnosis not present

## 2017-03-03 DIAGNOSIS — Z23 Encounter for immunization: Secondary | ICD-10-CM | POA: Diagnosis not present

## 2017-03-03 DIAGNOSIS — D509 Iron deficiency anemia, unspecified: Secondary | ICD-10-CM | POA: Diagnosis not present

## 2017-03-03 DIAGNOSIS — D631 Anemia in chronic kidney disease: Secondary | ICD-10-CM | POA: Diagnosis not present

## 2017-03-03 DIAGNOSIS — Z4932 Encounter for adequacy testing for peritoneal dialysis: Secondary | ICD-10-CM | POA: Diagnosis not present

## 2017-03-03 DIAGNOSIS — E119 Type 2 diabetes mellitus without complications: Secondary | ICD-10-CM | POA: Diagnosis not present

## 2017-03-03 DIAGNOSIS — N2581 Secondary hyperparathyroidism of renal origin: Secondary | ICD-10-CM | POA: Diagnosis not present

## 2017-03-04 DIAGNOSIS — N186 End stage renal disease: Secondary | ICD-10-CM | POA: Diagnosis not present

## 2017-03-04 DIAGNOSIS — D631 Anemia in chronic kidney disease: Secondary | ICD-10-CM | POA: Diagnosis not present

## 2017-03-04 DIAGNOSIS — Z23 Encounter for immunization: Secondary | ICD-10-CM | POA: Diagnosis not present

## 2017-03-04 DIAGNOSIS — R8299 Other abnormal findings in urine: Secondary | ICD-10-CM | POA: Diagnosis not present

## 2017-03-04 DIAGNOSIS — N2581 Secondary hyperparathyroidism of renal origin: Secondary | ICD-10-CM | POA: Diagnosis not present

## 2017-03-04 DIAGNOSIS — Z4932 Encounter for adequacy testing for peritoneal dialysis: Secondary | ICD-10-CM | POA: Diagnosis not present

## 2017-03-04 DIAGNOSIS — E784 Other hyperlipidemia: Secondary | ICD-10-CM | POA: Diagnosis not present

## 2017-03-04 DIAGNOSIS — N2589 Other disorders resulting from impaired renal tubular function: Secondary | ICD-10-CM | POA: Diagnosis not present

## 2017-03-04 DIAGNOSIS — D509 Iron deficiency anemia, unspecified: Secondary | ICD-10-CM | POA: Diagnosis not present

## 2017-03-04 DIAGNOSIS — E119 Type 2 diabetes mellitus without complications: Secondary | ICD-10-CM | POA: Diagnosis not present

## 2017-03-05 DIAGNOSIS — N2581 Secondary hyperparathyroidism of renal origin: Secondary | ICD-10-CM | POA: Diagnosis not present

## 2017-03-05 DIAGNOSIS — E784 Other hyperlipidemia: Secondary | ICD-10-CM | POA: Diagnosis not present

## 2017-03-05 DIAGNOSIS — E119 Type 2 diabetes mellitus without complications: Secondary | ICD-10-CM | POA: Diagnosis not present

## 2017-03-05 DIAGNOSIS — D631 Anemia in chronic kidney disease: Secondary | ICD-10-CM | POA: Diagnosis not present

## 2017-03-05 DIAGNOSIS — R8299 Other abnormal findings in urine: Secondary | ICD-10-CM | POA: Diagnosis not present

## 2017-03-05 DIAGNOSIS — Z23 Encounter for immunization: Secondary | ICD-10-CM | POA: Diagnosis not present

## 2017-03-05 DIAGNOSIS — N2589 Other disorders resulting from impaired renal tubular function: Secondary | ICD-10-CM | POA: Diagnosis not present

## 2017-03-05 DIAGNOSIS — N186 End stage renal disease: Secondary | ICD-10-CM | POA: Diagnosis not present

## 2017-03-05 DIAGNOSIS — Z4932 Encounter for adequacy testing for peritoneal dialysis: Secondary | ICD-10-CM | POA: Diagnosis not present

## 2017-03-05 DIAGNOSIS — D509 Iron deficiency anemia, unspecified: Secondary | ICD-10-CM | POA: Diagnosis not present

## 2017-03-06 ENCOUNTER — Encounter (INDEPENDENT_AMBULATORY_CARE_PROVIDER_SITE_OTHER): Payer: Medicare Other | Admitting: Ophthalmology

## 2017-03-06 DIAGNOSIS — E11311 Type 2 diabetes mellitus with unspecified diabetic retinopathy with macular edema: Secondary | ICD-10-CM | POA: Diagnosis not present

## 2017-03-06 DIAGNOSIS — E113311 Type 2 diabetes mellitus with moderate nonproliferative diabetic retinopathy with macular edema, right eye: Secondary | ICD-10-CM

## 2017-03-06 DIAGNOSIS — Z4932 Encounter for adequacy testing for peritoneal dialysis: Secondary | ICD-10-CM | POA: Diagnosis not present

## 2017-03-06 DIAGNOSIS — N186 End stage renal disease: Secondary | ICD-10-CM | POA: Diagnosis not present

## 2017-03-06 DIAGNOSIS — D631 Anemia in chronic kidney disease: Secondary | ICD-10-CM | POA: Diagnosis not present

## 2017-03-06 DIAGNOSIS — H43813 Vitreous degeneration, bilateral: Secondary | ICD-10-CM | POA: Diagnosis not present

## 2017-03-06 DIAGNOSIS — N2581 Secondary hyperparathyroidism of renal origin: Secondary | ICD-10-CM | POA: Diagnosis not present

## 2017-03-06 DIAGNOSIS — I10 Essential (primary) hypertension: Secondary | ICD-10-CM | POA: Diagnosis not present

## 2017-03-06 DIAGNOSIS — R8299 Other abnormal findings in urine: Secondary | ICD-10-CM | POA: Diagnosis not present

## 2017-03-06 DIAGNOSIS — E113292 Type 2 diabetes mellitus with mild nonproliferative diabetic retinopathy without macular edema, left eye: Secondary | ICD-10-CM | POA: Diagnosis not present

## 2017-03-06 DIAGNOSIS — E119 Type 2 diabetes mellitus without complications: Secondary | ICD-10-CM | POA: Diagnosis not present

## 2017-03-06 DIAGNOSIS — Z23 Encounter for immunization: Secondary | ICD-10-CM | POA: Diagnosis not present

## 2017-03-06 DIAGNOSIS — H35033 Hypertensive retinopathy, bilateral: Secondary | ICD-10-CM

## 2017-03-06 DIAGNOSIS — D509 Iron deficiency anemia, unspecified: Secondary | ICD-10-CM | POA: Diagnosis not present

## 2017-03-06 DIAGNOSIS — N2589 Other disorders resulting from impaired renal tubular function: Secondary | ICD-10-CM | POA: Diagnosis not present

## 2017-03-06 DIAGNOSIS — E784 Other hyperlipidemia: Secondary | ICD-10-CM | POA: Diagnosis not present

## 2017-03-07 DIAGNOSIS — N2581 Secondary hyperparathyroidism of renal origin: Secondary | ICD-10-CM | POA: Diagnosis not present

## 2017-03-07 DIAGNOSIS — Z23 Encounter for immunization: Secondary | ICD-10-CM | POA: Diagnosis not present

## 2017-03-07 DIAGNOSIS — D509 Iron deficiency anemia, unspecified: Secondary | ICD-10-CM | POA: Diagnosis not present

## 2017-03-07 DIAGNOSIS — E784 Other hyperlipidemia: Secondary | ICD-10-CM | POA: Diagnosis not present

## 2017-03-07 DIAGNOSIS — D631 Anemia in chronic kidney disease: Secondary | ICD-10-CM | POA: Diagnosis not present

## 2017-03-07 DIAGNOSIS — N2589 Other disorders resulting from impaired renal tubular function: Secondary | ICD-10-CM | POA: Diagnosis not present

## 2017-03-07 DIAGNOSIS — R8299 Other abnormal findings in urine: Secondary | ICD-10-CM | POA: Diagnosis not present

## 2017-03-07 DIAGNOSIS — Z4932 Encounter for adequacy testing for peritoneal dialysis: Secondary | ICD-10-CM | POA: Diagnosis not present

## 2017-03-07 DIAGNOSIS — E119 Type 2 diabetes mellitus without complications: Secondary | ICD-10-CM | POA: Diagnosis not present

## 2017-03-07 DIAGNOSIS — N186 End stage renal disease: Secondary | ICD-10-CM | POA: Diagnosis not present

## 2017-03-08 DIAGNOSIS — D509 Iron deficiency anemia, unspecified: Secondary | ICD-10-CM | POA: Diagnosis not present

## 2017-03-08 DIAGNOSIS — E784 Other hyperlipidemia: Secondary | ICD-10-CM | POA: Diagnosis not present

## 2017-03-08 DIAGNOSIS — E119 Type 2 diabetes mellitus without complications: Secondary | ICD-10-CM | POA: Diagnosis not present

## 2017-03-08 DIAGNOSIS — N2589 Other disorders resulting from impaired renal tubular function: Secondary | ICD-10-CM | POA: Diagnosis not present

## 2017-03-08 DIAGNOSIS — R8299 Other abnormal findings in urine: Secondary | ICD-10-CM | POA: Diagnosis not present

## 2017-03-08 DIAGNOSIS — N186 End stage renal disease: Secondary | ICD-10-CM | POA: Diagnosis not present

## 2017-03-08 DIAGNOSIS — N2581 Secondary hyperparathyroidism of renal origin: Secondary | ICD-10-CM | POA: Diagnosis not present

## 2017-03-08 DIAGNOSIS — D631 Anemia in chronic kidney disease: Secondary | ICD-10-CM | POA: Diagnosis not present

## 2017-03-08 DIAGNOSIS — Z23 Encounter for immunization: Secondary | ICD-10-CM | POA: Diagnosis not present

## 2017-03-08 DIAGNOSIS — Z4932 Encounter for adequacy testing for peritoneal dialysis: Secondary | ICD-10-CM | POA: Diagnosis not present

## 2017-03-09 DIAGNOSIS — D509 Iron deficiency anemia, unspecified: Secondary | ICD-10-CM | POA: Diagnosis not present

## 2017-03-09 DIAGNOSIS — R8299 Other abnormal findings in urine: Secondary | ICD-10-CM | POA: Diagnosis not present

## 2017-03-09 DIAGNOSIS — E784 Other hyperlipidemia: Secondary | ICD-10-CM | POA: Diagnosis not present

## 2017-03-09 DIAGNOSIS — N2581 Secondary hyperparathyroidism of renal origin: Secondary | ICD-10-CM | POA: Diagnosis not present

## 2017-03-09 DIAGNOSIS — D631 Anemia in chronic kidney disease: Secondary | ICD-10-CM | POA: Diagnosis not present

## 2017-03-09 DIAGNOSIS — Z23 Encounter for immunization: Secondary | ICD-10-CM | POA: Diagnosis not present

## 2017-03-09 DIAGNOSIS — N186 End stage renal disease: Secondary | ICD-10-CM | POA: Diagnosis not present

## 2017-03-09 DIAGNOSIS — N2589 Other disorders resulting from impaired renal tubular function: Secondary | ICD-10-CM | POA: Diagnosis not present

## 2017-03-09 DIAGNOSIS — Z4932 Encounter for adequacy testing for peritoneal dialysis: Secondary | ICD-10-CM | POA: Diagnosis not present

## 2017-03-09 DIAGNOSIS — E119 Type 2 diabetes mellitus without complications: Secondary | ICD-10-CM | POA: Diagnosis not present

## 2017-03-10 DIAGNOSIS — D631 Anemia in chronic kidney disease: Secondary | ICD-10-CM | POA: Diagnosis not present

## 2017-03-10 DIAGNOSIS — Z23 Encounter for immunization: Secondary | ICD-10-CM | POA: Diagnosis not present

## 2017-03-10 DIAGNOSIS — N2589 Other disorders resulting from impaired renal tubular function: Secondary | ICD-10-CM | POA: Diagnosis not present

## 2017-03-10 DIAGNOSIS — N186 End stage renal disease: Secondary | ICD-10-CM | POA: Diagnosis not present

## 2017-03-10 DIAGNOSIS — R8299 Other abnormal findings in urine: Secondary | ICD-10-CM | POA: Diagnosis not present

## 2017-03-10 DIAGNOSIS — Z4932 Encounter for adequacy testing for peritoneal dialysis: Secondary | ICD-10-CM | POA: Diagnosis not present

## 2017-03-10 DIAGNOSIS — E784 Other hyperlipidemia: Secondary | ICD-10-CM | POA: Diagnosis not present

## 2017-03-10 DIAGNOSIS — E119 Type 2 diabetes mellitus without complications: Secondary | ICD-10-CM | POA: Diagnosis not present

## 2017-03-10 DIAGNOSIS — D509 Iron deficiency anemia, unspecified: Secondary | ICD-10-CM | POA: Diagnosis not present

## 2017-03-10 DIAGNOSIS — N2581 Secondary hyperparathyroidism of renal origin: Secondary | ICD-10-CM | POA: Diagnosis not present

## 2017-03-11 DIAGNOSIS — D631 Anemia in chronic kidney disease: Secondary | ICD-10-CM | POA: Diagnosis not present

## 2017-03-11 DIAGNOSIS — N2589 Other disorders resulting from impaired renal tubular function: Secondary | ICD-10-CM | POA: Diagnosis not present

## 2017-03-11 DIAGNOSIS — R8299 Other abnormal findings in urine: Secondary | ICD-10-CM | POA: Diagnosis not present

## 2017-03-11 DIAGNOSIS — Z4932 Encounter for adequacy testing for peritoneal dialysis: Secondary | ICD-10-CM | POA: Diagnosis not present

## 2017-03-11 DIAGNOSIS — N186 End stage renal disease: Secondary | ICD-10-CM | POA: Diagnosis not present

## 2017-03-11 DIAGNOSIS — D509 Iron deficiency anemia, unspecified: Secondary | ICD-10-CM | POA: Diagnosis not present

## 2017-03-11 DIAGNOSIS — Z23 Encounter for immunization: Secondary | ICD-10-CM | POA: Diagnosis not present

## 2017-03-11 DIAGNOSIS — E119 Type 2 diabetes mellitus without complications: Secondary | ICD-10-CM | POA: Diagnosis not present

## 2017-03-11 DIAGNOSIS — N2581 Secondary hyperparathyroidism of renal origin: Secondary | ICD-10-CM | POA: Diagnosis not present

## 2017-03-11 DIAGNOSIS — E784 Other hyperlipidemia: Secondary | ICD-10-CM | POA: Diagnosis not present

## 2017-03-12 DIAGNOSIS — Z4932 Encounter for adequacy testing for peritoneal dialysis: Secondary | ICD-10-CM | POA: Diagnosis not present

## 2017-03-12 DIAGNOSIS — N2589 Other disorders resulting from impaired renal tubular function: Secondary | ICD-10-CM | POA: Diagnosis not present

## 2017-03-12 DIAGNOSIS — N2581 Secondary hyperparathyroidism of renal origin: Secondary | ICD-10-CM | POA: Diagnosis not present

## 2017-03-12 DIAGNOSIS — E784 Other hyperlipidemia: Secondary | ICD-10-CM | POA: Diagnosis not present

## 2017-03-12 DIAGNOSIS — R8299 Other abnormal findings in urine: Secondary | ICD-10-CM | POA: Diagnosis not present

## 2017-03-12 DIAGNOSIS — D509 Iron deficiency anemia, unspecified: Secondary | ICD-10-CM | POA: Diagnosis not present

## 2017-03-12 DIAGNOSIS — E119 Type 2 diabetes mellitus without complications: Secondary | ICD-10-CM | POA: Diagnosis not present

## 2017-03-12 DIAGNOSIS — N186 End stage renal disease: Secondary | ICD-10-CM | POA: Diagnosis not present

## 2017-03-12 DIAGNOSIS — Z23 Encounter for immunization: Secondary | ICD-10-CM | POA: Diagnosis not present

## 2017-03-12 DIAGNOSIS — D631 Anemia in chronic kidney disease: Secondary | ICD-10-CM | POA: Diagnosis not present

## 2017-03-13 DIAGNOSIS — R8299 Other abnormal findings in urine: Secondary | ICD-10-CM | POA: Diagnosis not present

## 2017-03-13 DIAGNOSIS — E784 Other hyperlipidemia: Secondary | ICD-10-CM | POA: Diagnosis not present

## 2017-03-13 DIAGNOSIS — N186 End stage renal disease: Secondary | ICD-10-CM | POA: Diagnosis not present

## 2017-03-13 DIAGNOSIS — D631 Anemia in chronic kidney disease: Secondary | ICD-10-CM | POA: Diagnosis not present

## 2017-03-13 DIAGNOSIS — Z4932 Encounter for adequacy testing for peritoneal dialysis: Secondary | ICD-10-CM | POA: Diagnosis not present

## 2017-03-13 DIAGNOSIS — N2589 Other disorders resulting from impaired renal tubular function: Secondary | ICD-10-CM | POA: Diagnosis not present

## 2017-03-13 DIAGNOSIS — D509 Iron deficiency anemia, unspecified: Secondary | ICD-10-CM | POA: Diagnosis not present

## 2017-03-13 DIAGNOSIS — Z23 Encounter for immunization: Secondary | ICD-10-CM | POA: Diagnosis not present

## 2017-03-13 DIAGNOSIS — N2581 Secondary hyperparathyroidism of renal origin: Secondary | ICD-10-CM | POA: Diagnosis not present

## 2017-03-13 DIAGNOSIS — E119 Type 2 diabetes mellitus without complications: Secondary | ICD-10-CM | POA: Diagnosis not present

## 2017-03-14 DIAGNOSIS — D509 Iron deficiency anemia, unspecified: Secondary | ICD-10-CM | POA: Diagnosis not present

## 2017-03-14 DIAGNOSIS — N2581 Secondary hyperparathyroidism of renal origin: Secondary | ICD-10-CM | POA: Diagnosis not present

## 2017-03-14 DIAGNOSIS — N2589 Other disorders resulting from impaired renal tubular function: Secondary | ICD-10-CM | POA: Diagnosis not present

## 2017-03-14 DIAGNOSIS — D631 Anemia in chronic kidney disease: Secondary | ICD-10-CM | POA: Diagnosis not present

## 2017-03-14 DIAGNOSIS — N186 End stage renal disease: Secondary | ICD-10-CM | POA: Diagnosis not present

## 2017-03-14 DIAGNOSIS — Z4932 Encounter for adequacy testing for peritoneal dialysis: Secondary | ICD-10-CM | POA: Diagnosis not present

## 2017-03-14 DIAGNOSIS — E784 Other hyperlipidemia: Secondary | ICD-10-CM | POA: Diagnosis not present

## 2017-03-14 DIAGNOSIS — E119 Type 2 diabetes mellitus without complications: Secondary | ICD-10-CM | POA: Diagnosis not present

## 2017-03-14 DIAGNOSIS — Z23 Encounter for immunization: Secondary | ICD-10-CM | POA: Diagnosis not present

## 2017-03-14 DIAGNOSIS — R8299 Other abnormal findings in urine: Secondary | ICD-10-CM | POA: Diagnosis not present

## 2017-03-15 DIAGNOSIS — N186 End stage renal disease: Secondary | ICD-10-CM | POA: Diagnosis not present

## 2017-03-15 DIAGNOSIS — E784 Other hyperlipidemia: Secondary | ICD-10-CM | POA: Diagnosis not present

## 2017-03-15 DIAGNOSIS — Z4932 Encounter for adequacy testing for peritoneal dialysis: Secondary | ICD-10-CM | POA: Diagnosis not present

## 2017-03-15 DIAGNOSIS — N2581 Secondary hyperparathyroidism of renal origin: Secondary | ICD-10-CM | POA: Diagnosis not present

## 2017-03-15 DIAGNOSIS — N2589 Other disorders resulting from impaired renal tubular function: Secondary | ICD-10-CM | POA: Diagnosis not present

## 2017-03-15 DIAGNOSIS — E119 Type 2 diabetes mellitus without complications: Secondary | ICD-10-CM | POA: Diagnosis not present

## 2017-03-15 DIAGNOSIS — D509 Iron deficiency anemia, unspecified: Secondary | ICD-10-CM | POA: Diagnosis not present

## 2017-03-15 DIAGNOSIS — Z23 Encounter for immunization: Secondary | ICD-10-CM | POA: Diagnosis not present

## 2017-03-15 DIAGNOSIS — D631 Anemia in chronic kidney disease: Secondary | ICD-10-CM | POA: Diagnosis not present

## 2017-03-15 DIAGNOSIS — R8299 Other abnormal findings in urine: Secondary | ICD-10-CM | POA: Diagnosis not present

## 2017-03-15 NOTE — Progress Notes (Signed)
MEDICARE ANNUAL WELLNESS VISIT AND FOLLOW UP  Assessment:     Medicare annual wellness exam -due next year  Essential hypertension -cont current meds -monitor at home -DASH diet -exercise   GERD -cont diet and exercise -medication prn   Secondary hyperparathyroidism (CKD) -Cont Vit D def   DJD -cont meds prn -avoid NSAIDs  Osteopenia -cont vit D   Primary osteoarthritis of right hip -heat as needed   ESRD (end stage renal disease) ([place]) -on dialysis at home   Anemia of chronic Renal Dz -monitor with CBC   Pancreatic cyst -monitor with pain   Gout without tophus, unspecified cause, unspecified chronicity, unspecified site -cont allopurinol  Vitamin D deficiency -cont supplement  Hyperlipidemia -cont diet and exercise   Medication management -cont lab monitoring.  -just had labs done by nephrology   BMI 25.0-25.9,adult -cont diet and exercise  Over 30 minutes of exam, counseling, chart review, and critical decision making was performed Future Appointments Date Time Provider [place]  04/24/2017 8:45 AM Hayden Pedro, MD [facility] None  06/15/2017 9:30 AM Unk Pinto, MD [facility] None  01/15/2018 10:00 AM Unk Pinto, MD [facility] None    Plan:   During the course of the visit the patient was educated and counseled about appropriate screening and preventive services including:    Pneumococcal vaccine   Influenza vaccine  Td vaccine  Prevnar 13  Screening electrocardiogram  Screening mammography  Bone densitometry screening  Colorectal cancer screening  Diabetes screening  Glaucoma screening  Nutrition counseling   Advanced directives: given info/requested copies   Subjective:   Eileen Perez is a 76 y.o. female who presents for Medicare Annual Wellness Visit and 3 month follow up on hypertension, prediabetes, hyperlipidemia, vitamin D def.   Her blood pressure has been controlled at home, today their BP is  BP: 120/66 She does not workout. She denies chest pain, shortness of breath, dizziness.   Patient has ESRD on peritoneal dialysis at home, no signs of fluid overload. She has history of secondary hyperparathyroidism. Had catheter repositioned around easter, had some blood loss with this, trying to get H/H up.  Wt Readings from Last 3 Encounters:  03/16/17 154 lb 3.2 oz (69.9 kg)  02/15/17 152 lb (68.9 kg)  12/08/16 152 lb (68.9 kg)   Lab Results  Component Value Date   GFRAA 4 (L) 02/15/2017   She is not on cholesterol medication and denies myalgias. Her cholesterol is at goal. The cholesterol last visit was:   Lab Results  Component Value Date   CHOL 217 (H) 12/08/2016   HDL 74 12/08/2016   LDLCALC 122 (H) 12/08/2016   TRIG 107 12/08/2016   CHOLHDL 2.9 12/08/2016   She has been working on diet and exercise for prediabetes, and denies foot ulcerations, hyperglycemia, hypoglycemia , increased appetite, nausea, paresthesia of the feet, polydipsia, polyuria, visual disturbances, vomiting and weight loss. Last A1C in the office was:  Lab Results  Component Value Date   HGBA1C 4.4 12/08/2016   Patient is on Vitamin D supplement. Lab Results  Component Value Date   VD25OH 31 12/08/2016      Patient is on allopurinol for gout and does not report a recent flare.  Lab Results  Component Value Date   LABURIC 3.2 05/19/2016   BMI is Body mass index is 29.14 kg/m., she is working on diet and exercise. Wt Readings from Last 3 Encounters:  03/16/17 154 lb 3.2 oz (69.9 kg)  02/15/17 152 lb (68.9  kg)  12/08/16 152 lb (68.9 kg)   Medication Review Current Outpatient Prescriptions on File Prior to Visit  Medication Sig Dispense Refill  . allopurinol (ZYLOPRIM) 300 MG tablet Take 300 mg by mouth every other day.    Marland Kitchen aspirin 81 MG tablet Take 81 mg by mouth at bedtime.     Marland Kitchen b complex-vitamin c-folic acid (NEPHRO-VITE) 0.8 MG TABS tablet Take 1 tablet by mouth at bedtime.    .  calcitRIOL (ROCALTROL) 0.5 MCG capsule Take 0.5 mcg by mouth every other day.    . calcium acetate (PHOSLO) 667 MG capsule Take 667 mg by mouth 3 (three) times daily with meals.     . Cholecalciferol (VITAMIN D-3 PO) Take 5,000 Units by mouth every other day.     . cinacalcet (SENSIPAR) 30 MG tablet Take 30 mg by mouth daily.    Marland Kitchen docusate sodium (COLACE) 100 MG capsule Take 100 mg by mouth 2 (two) times daily.    . Flaxseed, Linseed, (FLAXSEED OIL) 1000 MG CAPS Take 1 capsule by mouth daily.     Marland Kitchen glucose blood (ACCU-CHEK AVIVA PLUS) test strip Test once daily 100 each 6  . hydrALAZINE (APRESOLINE) 10 MG tablet TAKE 2 TABLETS BY MOUTH 2  TIMES DAILY. (Patient taking differently: Take 20 mg by mouth 2 (two) times daily. TAKE 2 TABLETS BY MOUTH 2  TIMES DAILY.) 360 tablet 3  . labetalol (NORMODYNE) 200 MG tablet TAKE 1 TABLET BY MOUTH TWO  TIMES DAILY 180 tablet 1  . Omega-3 Fatty Acids (FISH OIL) 1200 MG CAPS Take 2 capsules by mouth daily.    . sevelamer carbonate (RENVELA) 800 MG tablet Take 1,600 mg by mouth 3 (three) times daily with meals.     . zinc gluconate 50 MG tablet Take 50 mg by mouth every morning.    . [DISCONTINUED] diltiazem (CARDIZEM) 120 MG tablet Take 120 mg by mouth 2 (two) times daily.     No current facility-administered medications on file prior to visit.     Current Problems (verified) Patient Active Problem List   Diagnosis Date Noted  . 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 Medicare annual wellness exam 10/31/2015  . BMI 25.0-25.9,adult 10/31/2015  . ESRD (end stage renal disease) ([place]) 10/31/2015  . OA (osteoarthritis) of hip 05/26/2015  . Secondary hyperparathyroidism (CKD) 04/16/2014  . Vitamin D deficiency 04/16/2014  . Hyperlipidemia 04/16/2014  . Medication management 04/16/2014  . Anemia of chronic Renal Dz   . Pancreatic cyst   . GERD   . Gout   . Osteopenia   . Hypertension 06/10/2013  . DJD 06/09/2013    Screening Tests Immunization History   Administered Date(s) Administered  . DT 02/05/2014  . Influenza, High Dose Seasonal PF 09/25/2013  . Influenza-Unspecified 08/11/2015  . Pneumococcal Polysaccharide-23 11/20/2009  . Td 11/21/2003  . Zoster 11/20/2005    Preventative care: Last colonoscopy: 2014 Last mammogram: 02/2017 [id number]  Prior vaccinations: TD or Tdap: 2015 Influenza: 2017  Pneumococcal: 2011 Prevnar13: Declines Shingles/Zostavax: 2007  Names of Other Physician/Practitioners you currently use: 1. [facility]- here for primary care 2. Dr. Herbert Deaner, eye doctor, last visit 12/2016 3. Does not see one, dentist, last visit 2015 Patient Care Team: Unk Pinto, MD as PCP - General (Internal Medicine) Gaynelle Arabian, MD as Consulting Physician (Orthopedic Surgery) Corliss Parish, MD as Consulting Physician (Nephrology) Monna Fam, MD as Consulting Physician (Ophthalmology) Laurence Spates, MD as Consulting Physician (Gastroenterology) Lelon Perla, MD  as Consulting Physician (Cardiology) Kathie Rhodes, MD as Consulting Physician (Urology)  Allergies Allergies  Allergen Reactions  . Ace Inhibitors     unknown  . Nsaids     unknown    SURGICAL HISTORY She  has a past surgical history that includes Pancreatic cyst excision (1999); Joint replacement (2012); Eye surgery (Left); Cholecystectomy; Total knee arthroplasty (Right, 06/09/2013); Colonoscopy (03/21/12); left knee replacement ; Total hip arthroplasty (Right, 05/26/2015); and Insertion of dialysis catheter (N/A, 02/15/2017). FAMILY HISTORY Her family history includes Cancer in her father and mother; Diabetes in her mother; Hypertension in her mother. SOCIAL HISTORY She  reports that she has never smoked. She has never used smokeless tobacco. She reports that she does not drink alcohol or use drugs.  MEDICARE WELLNESS OBJECTIVES: Physical activity:   Cardiac risk factors:   Depression/mood screen:    Depression screen [facility] 2/9 12/08/2016  Decreased Interest 0  Down, Depressed, Hopeless 0  PHQ - 2 Score 0    ADLs:  In your present state of health, do you have any difficulty performing the following activities: 12/08/2016 05/19/2016  Hearing? N N  Vision? N N  Difficulty concentrating or making decisions? N N  Walking or climbing stairs? N N  Dressing or bathing? N N  Doing errands, shopping? N N  Some recent data might be hidden     Cognitive Testing  Alert? Yes  Normal Appearance?Yes  Oriented to person? Yes  Place? Yes   Time? Yes  Recall of three objects?  Yes  Can perform simple calculations? Yes  Displays appropriate judgment?Yes  Can read the correct time from a watch face?Yes  EOL planning: Does Patient Have a Medical Advance Directive?: Yes Type of Advance Directive: Healthcare Power of Attorney, Living will Copy of [place] in Chart?: No - copy requested   Objective:   Today's Vitals   03/16/17 0847  BP: 120/66  Pulse: 74  Resp: 16  Temp: 97.3 F (36.3 C)  SpO2: 98%  Weight: 154 lb 3.2 oz (69.9 kg)  Height: 5\' 1"  (1.549 m)  PainSc: 0-No pain   Body mass index is 29.14 kg/m.  General appearance: alert, no distress, WD/WN,  female HEENT: normocephalic, sclerae anicteric, TMs pearly, nares patent, no discharge or erythema, pharynx normal Oral cavity: MMM, no lesions Neck: supple, no lymphadenopathy, no thyromegaly, no masses Heart: RRR, normal S1, S2, no murmurs Lungs: CTA bilaterally, no wheezes, rhonchi, or rales Abdomen: +bs, soft, non tender, non distended, no masses, no hepatomegaly, no splenomegaly Musculoskeletal: nontender, no swelling, no obvious deformity Extremities: no edema, no cyanosis, no clubbing Pulses: 2+ symmetric, upper and lower extremities, normal cap refill Neurological: alert, oriented x 3, CN2-12 intact, strength normal upper extremities and lower extremities, sensation normal throughout, DTRs 2+ throughout,  no cerebellar signs, gait normal Psychiatric: normal affect, behavior normal, pleasant  Breast: defer Gyn: defer Rectal: defer   Medicare Attestation I have personally reviewed: The patient's medical and social history Their use of alcohol, tobacco or illicit drugs Their current medications and supplements The patient's functional ability including ADLs,fall risks, home safety risks, cognitive, and hearing and visual impairment Diet and physical activities Evidence for depression or mood disorders  The patient's weight, height, BMI, and visual acuity have been recorded in the chart.  I have made referrals, counseling, and provided education to the patient based on review of the above and I have provided the patient with a written personalized care plan for preventive services.  [name], PA-C   03/16/2017

## 2017-03-16 ENCOUNTER — Ambulatory Visit (INDEPENDENT_AMBULATORY_CARE_PROVIDER_SITE_OTHER): Payer: Medicare Other | Admitting: Physician Assistant

## 2017-03-16 ENCOUNTER — Encounter: Payer: Self-pay | Admitting: Physician Assistant

## 2017-03-16 VITALS — BP 120/66 | HR 74 | Temp 97.3°F | Resp 16 | Ht 61.0 in | Wt 154.2 lb

## 2017-03-16 DIAGNOSIS — N186 End stage renal disease: Secondary | ICD-10-CM | POA: Diagnosis not present

## 2017-03-16 DIAGNOSIS — N2581 Secondary hyperparathyroidism of renal origin: Secondary | ICD-10-CM

## 2017-03-16 DIAGNOSIS — Z79899 Other long term (current) drug therapy: Secondary | ICD-10-CM | POA: Diagnosis not present

## 2017-03-16 DIAGNOSIS — M159 Polyosteoarthritis, unspecified: Secondary | ICD-10-CM

## 2017-03-16 DIAGNOSIS — M1000 Idiopathic gout, unspecified site: Secondary | ICD-10-CM | POA: Diagnosis not present

## 2017-03-16 DIAGNOSIS — D509 Iron deficiency anemia, unspecified: Secondary | ICD-10-CM | POA: Diagnosis not present

## 2017-03-16 DIAGNOSIS — D638 Anemia in other chronic diseases classified elsewhere: Secondary | ICD-10-CM | POA: Diagnosis not present

## 2017-03-16 DIAGNOSIS — M8580 Other specified disorders of bone density and structure, unspecified site: Secondary | ICD-10-CM | POA: Diagnosis not present

## 2017-03-16 DIAGNOSIS — E119 Type 2 diabetes mellitus without complications: Secondary | ICD-10-CM | POA: Diagnosis not present

## 2017-03-16 DIAGNOSIS — Z23 Encounter for immunization: Secondary | ICD-10-CM | POA: Diagnosis not present

## 2017-03-16 DIAGNOSIS — M1611 Unilateral primary osteoarthritis, right hip: Secondary | ICD-10-CM | POA: Diagnosis not present

## 2017-03-16 DIAGNOSIS — K2100 Gastro-esophageal reflux disease with esophagitis, without bleeding: Secondary | ICD-10-CM

## 2017-03-16 DIAGNOSIS — Z6825 Body mass index (BMI) 25.0-25.9, adult: Secondary | ICD-10-CM

## 2017-03-16 DIAGNOSIS — Z0000 Encounter for general adult medical examination without abnormal findings: Secondary | ICD-10-CM

## 2017-03-16 DIAGNOSIS — R6889 Other general symptoms and signs: Secondary | ICD-10-CM

## 2017-03-16 DIAGNOSIS — E782 Mixed hyperlipidemia: Secondary | ICD-10-CM

## 2017-03-16 DIAGNOSIS — E784 Other hyperlipidemia: Secondary | ICD-10-CM | POA: Diagnosis not present

## 2017-03-16 DIAGNOSIS — I10 Essential (primary) hypertension: Secondary | ICD-10-CM

## 2017-03-16 DIAGNOSIS — E559 Vitamin D deficiency, unspecified: Secondary | ICD-10-CM | POA: Diagnosis not present

## 2017-03-16 DIAGNOSIS — Z0001 Encounter for general adult medical examination with abnormal findings: Secondary | ICD-10-CM | POA: Diagnosis not present

## 2017-03-16 DIAGNOSIS — N2589 Other disorders resulting from impaired renal tubular function: Secondary | ICD-10-CM | POA: Diagnosis not present

## 2017-03-16 DIAGNOSIS — D631 Anemia in chronic kidney disease: Secondary | ICD-10-CM | POA: Diagnosis not present

## 2017-03-16 DIAGNOSIS — K862 Cyst of pancreas: Secondary | ICD-10-CM

## 2017-03-16 DIAGNOSIS — E0821 Diabetes mellitus due to underlying condition with diabetic nephropathy: Secondary | ICD-10-CM

## 2017-03-16 DIAGNOSIS — Z4932 Encounter for adequacy testing for peritoneal dialysis: Secondary | ICD-10-CM | POA: Diagnosis not present

## 2017-03-16 DIAGNOSIS — R8299 Other abnormal findings in urine: Secondary | ICD-10-CM | POA: Diagnosis not present

## 2017-03-16 LAB — HEPATIC FUNCTION PANEL
ALT: 11 U/L (ref 6–29)
AST: 18 U/L (ref 10–35)
Albumin: 3.4 g/dL — ABNORMAL LOW (ref 3.6–5.1)
Alkaline Phosphatase: 151 U/L — ABNORMAL HIGH (ref 33–130)
BILIRUBIN DIRECT: 0.1 mg/dL (ref <=0.2)
BILIRUBIN INDIRECT: 0.3 mg/dL (ref 0.2–1.2)
BILIRUBIN TOTAL: 0.4 mg/dL (ref 0.2–1.2)
TOTAL PROTEIN: 5.9 g/dL — AB (ref 6.1–8.1)

## 2017-03-16 LAB — LIPID PANEL
CHOL/HDL RATIO: 3 ratio (ref <5.0)
CHOLESTEROL: 216 mg/dL — AB (ref <200)
HDL: 73 mg/dL (ref >50)
LDL CALC: 99 mg/dL (ref <100)
TRIGLYCERIDES: 220 mg/dL — AB (ref <150)
VLDL: 44 mg/dL — AB (ref <30)

## 2017-03-17 DIAGNOSIS — D631 Anemia in chronic kidney disease: Secondary | ICD-10-CM | POA: Diagnosis not present

## 2017-03-17 DIAGNOSIS — E784 Other hyperlipidemia: Secondary | ICD-10-CM | POA: Diagnosis not present

## 2017-03-17 DIAGNOSIS — E119 Type 2 diabetes mellitus without complications: Secondary | ICD-10-CM | POA: Diagnosis not present

## 2017-03-17 DIAGNOSIS — N2589 Other disorders resulting from impaired renal tubular function: Secondary | ICD-10-CM | POA: Diagnosis not present

## 2017-03-17 DIAGNOSIS — N186 End stage renal disease: Secondary | ICD-10-CM | POA: Diagnosis not present

## 2017-03-17 DIAGNOSIS — Z4932 Encounter for adequacy testing for peritoneal dialysis: Secondary | ICD-10-CM | POA: Diagnosis not present

## 2017-03-17 DIAGNOSIS — Z23 Encounter for immunization: Secondary | ICD-10-CM | POA: Diagnosis not present

## 2017-03-17 DIAGNOSIS — D509 Iron deficiency anemia, unspecified: Secondary | ICD-10-CM | POA: Diagnosis not present

## 2017-03-17 DIAGNOSIS — R8299 Other abnormal findings in urine: Secondary | ICD-10-CM | POA: Diagnosis not present

## 2017-03-17 DIAGNOSIS — N2581 Secondary hyperparathyroidism of renal origin: Secondary | ICD-10-CM | POA: Diagnosis not present

## 2017-03-17 LAB — VITAMIN D 25 HYDROXY (VIT D DEFICIENCY, FRACTURES): Vit D, 25-Hydroxy: 31 ng/mL (ref 30–100)

## 2017-03-17 LAB — URIC ACID: Uric Acid, Serum: 3.2 mg/dL (ref 2.5–7.0)

## 2017-03-17 LAB — MAGNESIUM: Magnesium: 1.9 mg/dL (ref 1.5–2.5)

## 2017-03-17 LAB — TSH: TSH: 2.57 mIU/L

## 2017-03-18 DIAGNOSIS — D631 Anemia in chronic kidney disease: Secondary | ICD-10-CM | POA: Diagnosis not present

## 2017-03-18 DIAGNOSIS — N186 End stage renal disease: Secondary | ICD-10-CM | POA: Diagnosis not present

## 2017-03-18 DIAGNOSIS — E784 Other hyperlipidemia: Secondary | ICD-10-CM | POA: Diagnosis not present

## 2017-03-18 DIAGNOSIS — E119 Type 2 diabetes mellitus without complications: Secondary | ICD-10-CM | POA: Diagnosis not present

## 2017-03-18 DIAGNOSIS — Z4932 Encounter for adequacy testing for peritoneal dialysis: Secondary | ICD-10-CM | POA: Diagnosis not present

## 2017-03-18 DIAGNOSIS — Z23 Encounter for immunization: Secondary | ICD-10-CM | POA: Diagnosis not present

## 2017-03-18 DIAGNOSIS — R8299 Other abnormal findings in urine: Secondary | ICD-10-CM | POA: Diagnosis not present

## 2017-03-18 DIAGNOSIS — N2589 Other disorders resulting from impaired renal tubular function: Secondary | ICD-10-CM | POA: Diagnosis not present

## 2017-03-18 DIAGNOSIS — N2581 Secondary hyperparathyroidism of renal origin: Secondary | ICD-10-CM | POA: Diagnosis not present

## 2017-03-18 DIAGNOSIS — D509 Iron deficiency anemia, unspecified: Secondary | ICD-10-CM | POA: Diagnosis not present

## 2017-03-19 DIAGNOSIS — D509 Iron deficiency anemia, unspecified: Secondary | ICD-10-CM | POA: Diagnosis not present

## 2017-03-19 DIAGNOSIS — Z992 Dependence on renal dialysis: Secondary | ICD-10-CM | POA: Diagnosis not present

## 2017-03-19 DIAGNOSIS — N186 End stage renal disease: Secondary | ICD-10-CM | POA: Diagnosis not present

## 2017-03-19 DIAGNOSIS — E1122 Type 2 diabetes mellitus with diabetic chronic kidney disease: Secondary | ICD-10-CM | POA: Diagnosis not present

## 2017-03-19 DIAGNOSIS — Z4932 Encounter for adequacy testing for peritoneal dialysis: Secondary | ICD-10-CM | POA: Diagnosis not present

## 2017-03-19 DIAGNOSIS — N2581 Secondary hyperparathyroidism of renal origin: Secondary | ICD-10-CM | POA: Diagnosis not present

## 2017-03-19 DIAGNOSIS — N2589 Other disorders resulting from impaired renal tubular function: Secondary | ICD-10-CM | POA: Diagnosis not present

## 2017-03-19 DIAGNOSIS — E784 Other hyperlipidemia: Secondary | ICD-10-CM | POA: Diagnosis not present

## 2017-03-19 DIAGNOSIS — R8299 Other abnormal findings in urine: Secondary | ICD-10-CM | POA: Diagnosis not present

## 2017-03-19 DIAGNOSIS — E119 Type 2 diabetes mellitus without complications: Secondary | ICD-10-CM | POA: Diagnosis not present

## 2017-03-19 DIAGNOSIS — D631 Anemia in chronic kidney disease: Secondary | ICD-10-CM | POA: Diagnosis not present

## 2017-03-19 DIAGNOSIS — Z23 Encounter for immunization: Secondary | ICD-10-CM | POA: Diagnosis not present

## 2017-03-19 NOTE — Progress Notes (Signed)
Pt aware of lab results & voiced understanding of those results.

## 2017-03-20 DIAGNOSIS — R8299 Other abnormal findings in urine: Secondary | ICD-10-CM | POA: Diagnosis not present

## 2017-03-20 DIAGNOSIS — E440 Moderate protein-calorie malnutrition: Secondary | ICD-10-CM | POA: Diagnosis not present

## 2017-03-20 DIAGNOSIS — D631 Anemia in chronic kidney disease: Secondary | ICD-10-CM | POA: Diagnosis not present

## 2017-03-20 DIAGNOSIS — D509 Iron deficiency anemia, unspecified: Secondary | ICD-10-CM | POA: Diagnosis not present

## 2017-03-20 DIAGNOSIS — Z79899 Other long term (current) drug therapy: Secondary | ICD-10-CM | POA: Diagnosis not present

## 2017-03-20 DIAGNOSIS — Z23 Encounter for immunization: Secondary | ICD-10-CM | POA: Diagnosis not present

## 2017-03-20 DIAGNOSIS — N186 End stage renal disease: Secondary | ICD-10-CM | POA: Diagnosis not present

## 2017-03-20 DIAGNOSIS — N2581 Secondary hyperparathyroidism of renal origin: Secondary | ICD-10-CM | POA: Diagnosis not present

## 2017-03-20 DIAGNOSIS — R17 Unspecified jaundice: Secondary | ICD-10-CM | POA: Diagnosis not present

## 2017-03-21 DIAGNOSIS — Z23 Encounter for immunization: Secondary | ICD-10-CM | POA: Diagnosis not present

## 2017-03-21 DIAGNOSIS — N186 End stage renal disease: Secondary | ICD-10-CM | POA: Diagnosis not present

## 2017-03-21 DIAGNOSIS — Z79899 Other long term (current) drug therapy: Secondary | ICD-10-CM | POA: Diagnosis not present

## 2017-03-21 DIAGNOSIS — R8299 Other abnormal findings in urine: Secondary | ICD-10-CM | POA: Diagnosis not present

## 2017-03-21 DIAGNOSIS — E440 Moderate protein-calorie malnutrition: Secondary | ICD-10-CM | POA: Diagnosis not present

## 2017-03-21 DIAGNOSIS — D509 Iron deficiency anemia, unspecified: Secondary | ICD-10-CM | POA: Diagnosis not present

## 2017-03-21 DIAGNOSIS — R17 Unspecified jaundice: Secondary | ICD-10-CM | POA: Diagnosis not present

## 2017-03-21 DIAGNOSIS — D631 Anemia in chronic kidney disease: Secondary | ICD-10-CM | POA: Diagnosis not present

## 2017-03-21 DIAGNOSIS — N2581 Secondary hyperparathyroidism of renal origin: Secondary | ICD-10-CM | POA: Diagnosis not present

## 2017-03-22 DIAGNOSIS — R17 Unspecified jaundice: Secondary | ICD-10-CM | POA: Diagnosis not present

## 2017-03-22 DIAGNOSIS — Z23 Encounter for immunization: Secondary | ICD-10-CM | POA: Diagnosis not present

## 2017-03-22 DIAGNOSIS — D631 Anemia in chronic kidney disease: Secondary | ICD-10-CM | POA: Diagnosis not present

## 2017-03-22 DIAGNOSIS — Z79899 Other long term (current) drug therapy: Secondary | ICD-10-CM | POA: Diagnosis not present

## 2017-03-22 DIAGNOSIS — D509 Iron deficiency anemia, unspecified: Secondary | ICD-10-CM | POA: Diagnosis not present

## 2017-03-22 DIAGNOSIS — R8299 Other abnormal findings in urine: Secondary | ICD-10-CM | POA: Diagnosis not present

## 2017-03-22 DIAGNOSIS — E440 Moderate protein-calorie malnutrition: Secondary | ICD-10-CM | POA: Diagnosis not present

## 2017-03-22 DIAGNOSIS — N2581 Secondary hyperparathyroidism of renal origin: Secondary | ICD-10-CM | POA: Diagnosis not present

## 2017-03-22 DIAGNOSIS — N186 End stage renal disease: Secondary | ICD-10-CM | POA: Diagnosis not present

## 2017-03-23 DIAGNOSIS — E440 Moderate protein-calorie malnutrition: Secondary | ICD-10-CM | POA: Diagnosis not present

## 2017-03-23 DIAGNOSIS — D509 Iron deficiency anemia, unspecified: Secondary | ICD-10-CM | POA: Diagnosis not present

## 2017-03-23 DIAGNOSIS — N186 End stage renal disease: Secondary | ICD-10-CM | POA: Diagnosis not present

## 2017-03-23 DIAGNOSIS — R8299 Other abnormal findings in urine: Secondary | ICD-10-CM | POA: Diagnosis not present

## 2017-03-23 DIAGNOSIS — N2581 Secondary hyperparathyroidism of renal origin: Secondary | ICD-10-CM | POA: Diagnosis not present

## 2017-03-23 DIAGNOSIS — Z79899 Other long term (current) drug therapy: Secondary | ICD-10-CM | POA: Diagnosis not present

## 2017-03-23 DIAGNOSIS — R17 Unspecified jaundice: Secondary | ICD-10-CM | POA: Diagnosis not present

## 2017-03-23 DIAGNOSIS — Z23 Encounter for immunization: Secondary | ICD-10-CM | POA: Diagnosis not present

## 2017-03-23 DIAGNOSIS — D631 Anemia in chronic kidney disease: Secondary | ICD-10-CM | POA: Diagnosis not present

## 2017-03-24 DIAGNOSIS — Z23 Encounter for immunization: Secondary | ICD-10-CM | POA: Diagnosis not present

## 2017-03-24 DIAGNOSIS — D631 Anemia in chronic kidney disease: Secondary | ICD-10-CM | POA: Diagnosis not present

## 2017-03-24 DIAGNOSIS — Z79899 Other long term (current) drug therapy: Secondary | ICD-10-CM | POA: Diagnosis not present

## 2017-03-24 DIAGNOSIS — N2581 Secondary hyperparathyroidism of renal origin: Secondary | ICD-10-CM | POA: Diagnosis not present

## 2017-03-24 DIAGNOSIS — D509 Iron deficiency anemia, unspecified: Secondary | ICD-10-CM | POA: Diagnosis not present

## 2017-03-24 DIAGNOSIS — R8299 Other abnormal findings in urine: Secondary | ICD-10-CM | POA: Diagnosis not present

## 2017-03-24 DIAGNOSIS — N186 End stage renal disease: Secondary | ICD-10-CM | POA: Diagnosis not present

## 2017-03-24 DIAGNOSIS — R17 Unspecified jaundice: Secondary | ICD-10-CM | POA: Diagnosis not present

## 2017-03-24 DIAGNOSIS — E440 Moderate protein-calorie malnutrition: Secondary | ICD-10-CM | POA: Diagnosis not present

## 2017-03-25 DIAGNOSIS — D631 Anemia in chronic kidney disease: Secondary | ICD-10-CM | POA: Diagnosis not present

## 2017-03-25 DIAGNOSIS — E440 Moderate protein-calorie malnutrition: Secondary | ICD-10-CM | POA: Diagnosis not present

## 2017-03-25 DIAGNOSIS — R17 Unspecified jaundice: Secondary | ICD-10-CM | POA: Diagnosis not present

## 2017-03-25 DIAGNOSIS — R8299 Other abnormal findings in urine: Secondary | ICD-10-CM | POA: Diagnosis not present

## 2017-03-25 DIAGNOSIS — Z23 Encounter for immunization: Secondary | ICD-10-CM | POA: Diagnosis not present

## 2017-03-25 DIAGNOSIS — D509 Iron deficiency anemia, unspecified: Secondary | ICD-10-CM | POA: Diagnosis not present

## 2017-03-25 DIAGNOSIS — N186 End stage renal disease: Secondary | ICD-10-CM | POA: Diagnosis not present

## 2017-03-25 DIAGNOSIS — N2581 Secondary hyperparathyroidism of renal origin: Secondary | ICD-10-CM | POA: Diagnosis not present

## 2017-03-25 DIAGNOSIS — Z79899 Other long term (current) drug therapy: Secondary | ICD-10-CM | POA: Diagnosis not present

## 2017-03-26 DIAGNOSIS — N186 End stage renal disease: Secondary | ICD-10-CM | POA: Diagnosis not present

## 2017-03-26 DIAGNOSIS — E440 Moderate protein-calorie malnutrition: Secondary | ICD-10-CM | POA: Diagnosis not present

## 2017-03-26 DIAGNOSIS — R17 Unspecified jaundice: Secondary | ICD-10-CM | POA: Diagnosis not present

## 2017-03-26 DIAGNOSIS — Z23 Encounter for immunization: Secondary | ICD-10-CM | POA: Diagnosis not present

## 2017-03-26 DIAGNOSIS — D631 Anemia in chronic kidney disease: Secondary | ICD-10-CM | POA: Diagnosis not present

## 2017-03-26 DIAGNOSIS — R8299 Other abnormal findings in urine: Secondary | ICD-10-CM | POA: Diagnosis not present

## 2017-03-26 DIAGNOSIS — Z79899 Other long term (current) drug therapy: Secondary | ICD-10-CM | POA: Diagnosis not present

## 2017-03-26 DIAGNOSIS — N2581 Secondary hyperparathyroidism of renal origin: Secondary | ICD-10-CM | POA: Diagnosis not present

## 2017-03-26 DIAGNOSIS — D509 Iron deficiency anemia, unspecified: Secondary | ICD-10-CM | POA: Diagnosis not present

## 2017-03-27 DIAGNOSIS — R17 Unspecified jaundice: Secondary | ICD-10-CM | POA: Diagnosis not present

## 2017-03-27 DIAGNOSIS — Z79899 Other long term (current) drug therapy: Secondary | ICD-10-CM | POA: Diagnosis not present

## 2017-03-27 DIAGNOSIS — R8299 Other abnormal findings in urine: Secondary | ICD-10-CM | POA: Diagnosis not present

## 2017-03-27 DIAGNOSIS — D631 Anemia in chronic kidney disease: Secondary | ICD-10-CM | POA: Diagnosis not present

## 2017-03-27 DIAGNOSIS — D509 Iron deficiency anemia, unspecified: Secondary | ICD-10-CM | POA: Diagnosis not present

## 2017-03-27 DIAGNOSIS — E440 Moderate protein-calorie malnutrition: Secondary | ICD-10-CM | POA: Diagnosis not present

## 2017-03-27 DIAGNOSIS — N186 End stage renal disease: Secondary | ICD-10-CM | POA: Diagnosis not present

## 2017-03-27 DIAGNOSIS — N2581 Secondary hyperparathyroidism of renal origin: Secondary | ICD-10-CM | POA: Diagnosis not present

## 2017-03-27 DIAGNOSIS — Z23 Encounter for immunization: Secondary | ICD-10-CM | POA: Diagnosis not present

## 2017-03-28 DIAGNOSIS — D509 Iron deficiency anemia, unspecified: Secondary | ICD-10-CM | POA: Diagnosis not present

## 2017-03-28 DIAGNOSIS — N2581 Secondary hyperparathyroidism of renal origin: Secondary | ICD-10-CM | POA: Diagnosis not present

## 2017-03-28 DIAGNOSIS — R17 Unspecified jaundice: Secondary | ICD-10-CM | POA: Diagnosis not present

## 2017-03-28 DIAGNOSIS — Z79899 Other long term (current) drug therapy: Secondary | ICD-10-CM | POA: Diagnosis not present

## 2017-03-28 DIAGNOSIS — R8299 Other abnormal findings in urine: Secondary | ICD-10-CM | POA: Diagnosis not present

## 2017-03-28 DIAGNOSIS — N186 End stage renal disease: Secondary | ICD-10-CM | POA: Diagnosis not present

## 2017-03-28 DIAGNOSIS — Z23 Encounter for immunization: Secondary | ICD-10-CM | POA: Diagnosis not present

## 2017-03-28 DIAGNOSIS — E440 Moderate protein-calorie malnutrition: Secondary | ICD-10-CM | POA: Diagnosis not present

## 2017-03-28 DIAGNOSIS — D631 Anemia in chronic kidney disease: Secondary | ICD-10-CM | POA: Diagnosis not present

## 2017-03-29 DIAGNOSIS — N2581 Secondary hyperparathyroidism of renal origin: Secondary | ICD-10-CM | POA: Diagnosis not present

## 2017-03-29 DIAGNOSIS — D509 Iron deficiency anemia, unspecified: Secondary | ICD-10-CM | POA: Diagnosis not present

## 2017-03-29 DIAGNOSIS — Z23 Encounter for immunization: Secondary | ICD-10-CM | POA: Diagnosis not present

## 2017-03-29 DIAGNOSIS — R8299 Other abnormal findings in urine: Secondary | ICD-10-CM | POA: Diagnosis not present

## 2017-03-29 DIAGNOSIS — E440 Moderate protein-calorie malnutrition: Secondary | ICD-10-CM | POA: Diagnosis not present

## 2017-03-29 DIAGNOSIS — R17 Unspecified jaundice: Secondary | ICD-10-CM | POA: Diagnosis not present

## 2017-03-29 DIAGNOSIS — Z79899 Other long term (current) drug therapy: Secondary | ICD-10-CM | POA: Diagnosis not present

## 2017-03-29 DIAGNOSIS — N186 End stage renal disease: Secondary | ICD-10-CM | POA: Diagnosis not present

## 2017-03-29 DIAGNOSIS — D631 Anemia in chronic kidney disease: Secondary | ICD-10-CM | POA: Diagnosis not present

## 2017-03-30 DIAGNOSIS — R17 Unspecified jaundice: Secondary | ICD-10-CM | POA: Diagnosis not present

## 2017-03-30 DIAGNOSIS — E440 Moderate protein-calorie malnutrition: Secondary | ICD-10-CM | POA: Diagnosis not present

## 2017-03-30 DIAGNOSIS — N2581 Secondary hyperparathyroidism of renal origin: Secondary | ICD-10-CM | POA: Diagnosis not present

## 2017-03-30 DIAGNOSIS — N186 End stage renal disease: Secondary | ICD-10-CM | POA: Diagnosis not present

## 2017-03-30 DIAGNOSIS — Z23 Encounter for immunization: Secondary | ICD-10-CM | POA: Diagnosis not present

## 2017-03-30 DIAGNOSIS — Z79899 Other long term (current) drug therapy: Secondary | ICD-10-CM | POA: Diagnosis not present

## 2017-03-30 DIAGNOSIS — R8299 Other abnormal findings in urine: Secondary | ICD-10-CM | POA: Diagnosis not present

## 2017-03-30 DIAGNOSIS — D631 Anemia in chronic kidney disease: Secondary | ICD-10-CM | POA: Diagnosis not present

## 2017-03-30 DIAGNOSIS — D509 Iron deficiency anemia, unspecified: Secondary | ICD-10-CM | POA: Diagnosis not present

## 2017-03-31 DIAGNOSIS — R8299 Other abnormal findings in urine: Secondary | ICD-10-CM | POA: Diagnosis not present

## 2017-03-31 DIAGNOSIS — Z79899 Other long term (current) drug therapy: Secondary | ICD-10-CM | POA: Diagnosis not present

## 2017-03-31 DIAGNOSIS — D631 Anemia in chronic kidney disease: Secondary | ICD-10-CM | POA: Diagnosis not present

## 2017-03-31 DIAGNOSIS — N186 End stage renal disease: Secondary | ICD-10-CM | POA: Diagnosis not present

## 2017-03-31 DIAGNOSIS — E440 Moderate protein-calorie malnutrition: Secondary | ICD-10-CM | POA: Diagnosis not present

## 2017-03-31 DIAGNOSIS — N2581 Secondary hyperparathyroidism of renal origin: Secondary | ICD-10-CM | POA: Diagnosis not present

## 2017-03-31 DIAGNOSIS — R17 Unspecified jaundice: Secondary | ICD-10-CM | POA: Diagnosis not present

## 2017-03-31 DIAGNOSIS — Z23 Encounter for immunization: Secondary | ICD-10-CM | POA: Diagnosis not present

## 2017-03-31 DIAGNOSIS — D509 Iron deficiency anemia, unspecified: Secondary | ICD-10-CM | POA: Diagnosis not present

## 2017-04-01 DIAGNOSIS — D509 Iron deficiency anemia, unspecified: Secondary | ICD-10-CM | POA: Diagnosis not present

## 2017-04-01 DIAGNOSIS — Z79899 Other long term (current) drug therapy: Secondary | ICD-10-CM | POA: Diagnosis not present

## 2017-04-01 DIAGNOSIS — N186 End stage renal disease: Secondary | ICD-10-CM | POA: Diagnosis not present

## 2017-04-01 DIAGNOSIS — R8299 Other abnormal findings in urine: Secondary | ICD-10-CM | POA: Diagnosis not present

## 2017-04-01 DIAGNOSIS — Z23 Encounter for immunization: Secondary | ICD-10-CM | POA: Diagnosis not present

## 2017-04-01 DIAGNOSIS — N2581 Secondary hyperparathyroidism of renal origin: Secondary | ICD-10-CM | POA: Diagnosis not present

## 2017-04-01 DIAGNOSIS — D631 Anemia in chronic kidney disease: Secondary | ICD-10-CM | POA: Diagnosis not present

## 2017-04-01 DIAGNOSIS — E440 Moderate protein-calorie malnutrition: Secondary | ICD-10-CM | POA: Diagnosis not present

## 2017-04-01 DIAGNOSIS — R17 Unspecified jaundice: Secondary | ICD-10-CM | POA: Diagnosis not present

## 2017-04-02 DIAGNOSIS — Z23 Encounter for immunization: Secondary | ICD-10-CM | POA: Diagnosis not present

## 2017-04-02 DIAGNOSIS — N2581 Secondary hyperparathyroidism of renal origin: Secondary | ICD-10-CM | POA: Diagnosis not present

## 2017-04-02 DIAGNOSIS — R8299 Other abnormal findings in urine: Secondary | ICD-10-CM | POA: Diagnosis not present

## 2017-04-02 DIAGNOSIS — D631 Anemia in chronic kidney disease: Secondary | ICD-10-CM | POA: Diagnosis not present

## 2017-04-02 DIAGNOSIS — Z79899 Other long term (current) drug therapy: Secondary | ICD-10-CM | POA: Diagnosis not present

## 2017-04-02 DIAGNOSIS — N186 End stage renal disease: Secondary | ICD-10-CM | POA: Diagnosis not present

## 2017-04-02 DIAGNOSIS — R17 Unspecified jaundice: Secondary | ICD-10-CM | POA: Diagnosis not present

## 2017-04-02 DIAGNOSIS — D509 Iron deficiency anemia, unspecified: Secondary | ICD-10-CM | POA: Diagnosis not present

## 2017-04-02 DIAGNOSIS — E440 Moderate protein-calorie malnutrition: Secondary | ICD-10-CM | POA: Diagnosis not present

## 2017-04-03 DIAGNOSIS — R17 Unspecified jaundice: Secondary | ICD-10-CM | POA: Diagnosis not present

## 2017-04-03 DIAGNOSIS — Z23 Encounter for immunization: Secondary | ICD-10-CM | POA: Diagnosis not present

## 2017-04-03 DIAGNOSIS — D509 Iron deficiency anemia, unspecified: Secondary | ICD-10-CM | POA: Diagnosis not present

## 2017-04-03 DIAGNOSIS — R8299 Other abnormal findings in urine: Secondary | ICD-10-CM | POA: Diagnosis not present

## 2017-04-03 DIAGNOSIS — N2581 Secondary hyperparathyroidism of renal origin: Secondary | ICD-10-CM | POA: Diagnosis not present

## 2017-04-03 DIAGNOSIS — D631 Anemia in chronic kidney disease: Secondary | ICD-10-CM | POA: Diagnosis not present

## 2017-04-03 DIAGNOSIS — Z79899 Other long term (current) drug therapy: Secondary | ICD-10-CM | POA: Diagnosis not present

## 2017-04-03 DIAGNOSIS — N186 End stage renal disease: Secondary | ICD-10-CM | POA: Diagnosis not present

## 2017-04-03 DIAGNOSIS — E440 Moderate protein-calorie malnutrition: Secondary | ICD-10-CM | POA: Diagnosis not present

## 2017-04-04 DIAGNOSIS — N186 End stage renal disease: Secondary | ICD-10-CM | POA: Diagnosis not present

## 2017-04-04 DIAGNOSIS — N2581 Secondary hyperparathyroidism of renal origin: Secondary | ICD-10-CM | POA: Diagnosis not present

## 2017-04-04 DIAGNOSIS — R17 Unspecified jaundice: Secondary | ICD-10-CM | POA: Diagnosis not present

## 2017-04-04 DIAGNOSIS — D631 Anemia in chronic kidney disease: Secondary | ICD-10-CM | POA: Diagnosis not present

## 2017-04-04 DIAGNOSIS — D509 Iron deficiency anemia, unspecified: Secondary | ICD-10-CM | POA: Diagnosis not present

## 2017-04-04 DIAGNOSIS — E440 Moderate protein-calorie malnutrition: Secondary | ICD-10-CM | POA: Diagnosis not present

## 2017-04-04 DIAGNOSIS — Z79899 Other long term (current) drug therapy: Secondary | ICD-10-CM | POA: Diagnosis not present

## 2017-04-04 DIAGNOSIS — Z23 Encounter for immunization: Secondary | ICD-10-CM | POA: Diagnosis not present

## 2017-04-04 DIAGNOSIS — R8299 Other abnormal findings in urine: Secondary | ICD-10-CM | POA: Diagnosis not present

## 2017-04-05 DIAGNOSIS — D509 Iron deficiency anemia, unspecified: Secondary | ICD-10-CM | POA: Diagnosis not present

## 2017-04-05 DIAGNOSIS — N2581 Secondary hyperparathyroidism of renal origin: Secondary | ICD-10-CM | POA: Diagnosis not present

## 2017-04-05 DIAGNOSIS — R17 Unspecified jaundice: Secondary | ICD-10-CM | POA: Diagnosis not present

## 2017-04-05 DIAGNOSIS — D631 Anemia in chronic kidney disease: Secondary | ICD-10-CM | POA: Diagnosis not present

## 2017-04-05 DIAGNOSIS — N186 End stage renal disease: Secondary | ICD-10-CM | POA: Diagnosis not present

## 2017-04-05 DIAGNOSIS — R8299 Other abnormal findings in urine: Secondary | ICD-10-CM | POA: Diagnosis not present

## 2017-04-05 DIAGNOSIS — Z79899 Other long term (current) drug therapy: Secondary | ICD-10-CM | POA: Diagnosis not present

## 2017-04-05 DIAGNOSIS — Z23 Encounter for immunization: Secondary | ICD-10-CM | POA: Diagnosis not present

## 2017-04-05 DIAGNOSIS — E440 Moderate protein-calorie malnutrition: Secondary | ICD-10-CM | POA: Diagnosis not present

## 2017-04-06 DIAGNOSIS — R8299 Other abnormal findings in urine: Secondary | ICD-10-CM | POA: Diagnosis not present

## 2017-04-06 DIAGNOSIS — N186 End stage renal disease: Secondary | ICD-10-CM | POA: Diagnosis not present

## 2017-04-06 DIAGNOSIS — N2581 Secondary hyperparathyroidism of renal origin: Secondary | ICD-10-CM | POA: Diagnosis not present

## 2017-04-06 DIAGNOSIS — Z23 Encounter for immunization: Secondary | ICD-10-CM | POA: Diagnosis not present

## 2017-04-06 DIAGNOSIS — D509 Iron deficiency anemia, unspecified: Secondary | ICD-10-CM | POA: Diagnosis not present

## 2017-04-06 DIAGNOSIS — E440 Moderate protein-calorie malnutrition: Secondary | ICD-10-CM | POA: Diagnosis not present

## 2017-04-06 DIAGNOSIS — R17 Unspecified jaundice: Secondary | ICD-10-CM | POA: Diagnosis not present

## 2017-04-06 DIAGNOSIS — Z79899 Other long term (current) drug therapy: Secondary | ICD-10-CM | POA: Diagnosis not present

## 2017-04-06 DIAGNOSIS — D631 Anemia in chronic kidney disease: Secondary | ICD-10-CM | POA: Diagnosis not present

## 2017-04-07 DIAGNOSIS — D631 Anemia in chronic kidney disease: Secondary | ICD-10-CM | POA: Diagnosis not present

## 2017-04-07 DIAGNOSIS — N2581 Secondary hyperparathyroidism of renal origin: Secondary | ICD-10-CM | POA: Diagnosis not present

## 2017-04-07 DIAGNOSIS — N186 End stage renal disease: Secondary | ICD-10-CM | POA: Diagnosis not present

## 2017-04-07 DIAGNOSIS — Z79899 Other long term (current) drug therapy: Secondary | ICD-10-CM | POA: Diagnosis not present

## 2017-04-07 DIAGNOSIS — R8299 Other abnormal findings in urine: Secondary | ICD-10-CM | POA: Diagnosis not present

## 2017-04-07 DIAGNOSIS — R17 Unspecified jaundice: Secondary | ICD-10-CM | POA: Diagnosis not present

## 2017-04-07 DIAGNOSIS — D509 Iron deficiency anemia, unspecified: Secondary | ICD-10-CM | POA: Diagnosis not present

## 2017-04-07 DIAGNOSIS — E440 Moderate protein-calorie malnutrition: Secondary | ICD-10-CM | POA: Diagnosis not present

## 2017-04-07 DIAGNOSIS — Z23 Encounter for immunization: Secondary | ICD-10-CM | POA: Diagnosis not present

## 2017-04-08 DIAGNOSIS — N186 End stage renal disease: Secondary | ICD-10-CM | POA: Diagnosis not present

## 2017-04-08 DIAGNOSIS — E440 Moderate protein-calorie malnutrition: Secondary | ICD-10-CM | POA: Diagnosis not present

## 2017-04-08 DIAGNOSIS — R17 Unspecified jaundice: Secondary | ICD-10-CM | POA: Diagnosis not present

## 2017-04-08 DIAGNOSIS — Z23 Encounter for immunization: Secondary | ICD-10-CM | POA: Diagnosis not present

## 2017-04-08 DIAGNOSIS — Z79899 Other long term (current) drug therapy: Secondary | ICD-10-CM | POA: Diagnosis not present

## 2017-04-08 DIAGNOSIS — D631 Anemia in chronic kidney disease: Secondary | ICD-10-CM | POA: Diagnosis not present

## 2017-04-08 DIAGNOSIS — D509 Iron deficiency anemia, unspecified: Secondary | ICD-10-CM | POA: Diagnosis not present

## 2017-04-08 DIAGNOSIS — N2581 Secondary hyperparathyroidism of renal origin: Secondary | ICD-10-CM | POA: Diagnosis not present

## 2017-04-08 DIAGNOSIS — R8299 Other abnormal findings in urine: Secondary | ICD-10-CM | POA: Diagnosis not present

## 2017-04-09 DIAGNOSIS — Z23 Encounter for immunization: Secondary | ICD-10-CM | POA: Diagnosis not present

## 2017-04-09 DIAGNOSIS — N2581 Secondary hyperparathyroidism of renal origin: Secondary | ICD-10-CM | POA: Diagnosis not present

## 2017-04-09 DIAGNOSIS — R8299 Other abnormal findings in urine: Secondary | ICD-10-CM | POA: Diagnosis not present

## 2017-04-09 DIAGNOSIS — R17 Unspecified jaundice: Secondary | ICD-10-CM | POA: Diagnosis not present

## 2017-04-09 DIAGNOSIS — D631 Anemia in chronic kidney disease: Secondary | ICD-10-CM | POA: Diagnosis not present

## 2017-04-09 DIAGNOSIS — D509 Iron deficiency anemia, unspecified: Secondary | ICD-10-CM | POA: Diagnosis not present

## 2017-04-09 DIAGNOSIS — E440 Moderate protein-calorie malnutrition: Secondary | ICD-10-CM | POA: Diagnosis not present

## 2017-04-09 DIAGNOSIS — Z79899 Other long term (current) drug therapy: Secondary | ICD-10-CM | POA: Diagnosis not present

## 2017-04-09 DIAGNOSIS — N186 End stage renal disease: Secondary | ICD-10-CM | POA: Diagnosis not present

## 2017-04-10 DIAGNOSIS — R8299 Other abnormal findings in urine: Secondary | ICD-10-CM | POA: Diagnosis not present

## 2017-04-10 DIAGNOSIS — N2581 Secondary hyperparathyroidism of renal origin: Secondary | ICD-10-CM | POA: Diagnosis not present

## 2017-04-10 DIAGNOSIS — E440 Moderate protein-calorie malnutrition: Secondary | ICD-10-CM | POA: Diagnosis not present

## 2017-04-10 DIAGNOSIS — D631 Anemia in chronic kidney disease: Secondary | ICD-10-CM | POA: Diagnosis not present

## 2017-04-10 DIAGNOSIS — D509 Iron deficiency anemia, unspecified: Secondary | ICD-10-CM | POA: Diagnosis not present

## 2017-04-10 DIAGNOSIS — Z23 Encounter for immunization: Secondary | ICD-10-CM | POA: Diagnosis not present

## 2017-04-10 DIAGNOSIS — R17 Unspecified jaundice: Secondary | ICD-10-CM | POA: Diagnosis not present

## 2017-04-10 DIAGNOSIS — N186 End stage renal disease: Secondary | ICD-10-CM | POA: Diagnosis not present

## 2017-04-10 DIAGNOSIS — Z79899 Other long term (current) drug therapy: Secondary | ICD-10-CM | POA: Diagnosis not present

## 2017-04-11 DIAGNOSIS — R8299 Other abnormal findings in urine: Secondary | ICD-10-CM | POA: Diagnosis not present

## 2017-04-11 DIAGNOSIS — Z23 Encounter for immunization: Secondary | ICD-10-CM | POA: Diagnosis not present

## 2017-04-11 DIAGNOSIS — R17 Unspecified jaundice: Secondary | ICD-10-CM | POA: Diagnosis not present

## 2017-04-11 DIAGNOSIS — D509 Iron deficiency anemia, unspecified: Secondary | ICD-10-CM | POA: Diagnosis not present

## 2017-04-11 DIAGNOSIS — D631 Anemia in chronic kidney disease: Secondary | ICD-10-CM | POA: Diagnosis not present

## 2017-04-11 DIAGNOSIS — E440 Moderate protein-calorie malnutrition: Secondary | ICD-10-CM | POA: Diagnosis not present

## 2017-04-11 DIAGNOSIS — N186 End stage renal disease: Secondary | ICD-10-CM | POA: Diagnosis not present

## 2017-04-11 DIAGNOSIS — Z79899 Other long term (current) drug therapy: Secondary | ICD-10-CM | POA: Diagnosis not present

## 2017-04-11 DIAGNOSIS — N2581 Secondary hyperparathyroidism of renal origin: Secondary | ICD-10-CM | POA: Diagnosis not present

## 2017-04-12 DIAGNOSIS — R17 Unspecified jaundice: Secondary | ICD-10-CM | POA: Diagnosis not present

## 2017-04-12 DIAGNOSIS — N2581 Secondary hyperparathyroidism of renal origin: Secondary | ICD-10-CM | POA: Diagnosis not present

## 2017-04-12 DIAGNOSIS — D631 Anemia in chronic kidney disease: Secondary | ICD-10-CM | POA: Diagnosis not present

## 2017-04-12 DIAGNOSIS — E440 Moderate protein-calorie malnutrition: Secondary | ICD-10-CM | POA: Diagnosis not present

## 2017-04-12 DIAGNOSIS — Z79899 Other long term (current) drug therapy: Secondary | ICD-10-CM | POA: Diagnosis not present

## 2017-04-12 DIAGNOSIS — D509 Iron deficiency anemia, unspecified: Secondary | ICD-10-CM | POA: Diagnosis not present

## 2017-04-12 DIAGNOSIS — N186 End stage renal disease: Secondary | ICD-10-CM | POA: Diagnosis not present

## 2017-04-12 DIAGNOSIS — Z23 Encounter for immunization: Secondary | ICD-10-CM | POA: Diagnosis not present

## 2017-04-12 DIAGNOSIS — R8299 Other abnormal findings in urine: Secondary | ICD-10-CM | POA: Diagnosis not present

## 2017-04-13 DIAGNOSIS — N186 End stage renal disease: Secondary | ICD-10-CM | POA: Diagnosis not present

## 2017-04-13 DIAGNOSIS — Z23 Encounter for immunization: Secondary | ICD-10-CM | POA: Diagnosis not present

## 2017-04-13 DIAGNOSIS — Z79899 Other long term (current) drug therapy: Secondary | ICD-10-CM | POA: Diagnosis not present

## 2017-04-13 DIAGNOSIS — N2581 Secondary hyperparathyroidism of renal origin: Secondary | ICD-10-CM | POA: Diagnosis not present

## 2017-04-13 DIAGNOSIS — D631 Anemia in chronic kidney disease: Secondary | ICD-10-CM | POA: Diagnosis not present

## 2017-04-13 DIAGNOSIS — D509 Iron deficiency anemia, unspecified: Secondary | ICD-10-CM | POA: Diagnosis not present

## 2017-04-13 DIAGNOSIS — R8299 Other abnormal findings in urine: Secondary | ICD-10-CM | POA: Diagnosis not present

## 2017-04-13 DIAGNOSIS — E440 Moderate protein-calorie malnutrition: Secondary | ICD-10-CM | POA: Diagnosis not present

## 2017-04-13 DIAGNOSIS — R17 Unspecified jaundice: Secondary | ICD-10-CM | POA: Diagnosis not present

## 2017-04-14 DIAGNOSIS — E440 Moderate protein-calorie malnutrition: Secondary | ICD-10-CM | POA: Diagnosis not present

## 2017-04-14 DIAGNOSIS — D509 Iron deficiency anemia, unspecified: Secondary | ICD-10-CM | POA: Diagnosis not present

## 2017-04-14 DIAGNOSIS — N186 End stage renal disease: Secondary | ICD-10-CM | POA: Diagnosis not present

## 2017-04-14 DIAGNOSIS — R8299 Other abnormal findings in urine: Secondary | ICD-10-CM | POA: Diagnosis not present

## 2017-04-14 DIAGNOSIS — N2581 Secondary hyperparathyroidism of renal origin: Secondary | ICD-10-CM | POA: Diagnosis not present

## 2017-04-14 DIAGNOSIS — R17 Unspecified jaundice: Secondary | ICD-10-CM | POA: Diagnosis not present

## 2017-04-14 DIAGNOSIS — Z23 Encounter for immunization: Secondary | ICD-10-CM | POA: Diagnosis not present

## 2017-04-14 DIAGNOSIS — Z79899 Other long term (current) drug therapy: Secondary | ICD-10-CM | POA: Diagnosis not present

## 2017-04-14 DIAGNOSIS — D631 Anemia in chronic kidney disease: Secondary | ICD-10-CM | POA: Diagnosis not present

## 2017-04-15 DIAGNOSIS — E440 Moderate protein-calorie malnutrition: Secondary | ICD-10-CM | POA: Diagnosis not present

## 2017-04-15 DIAGNOSIS — D631 Anemia in chronic kidney disease: Secondary | ICD-10-CM | POA: Diagnosis not present

## 2017-04-15 DIAGNOSIS — N186 End stage renal disease: Secondary | ICD-10-CM | POA: Diagnosis not present

## 2017-04-15 DIAGNOSIS — R17 Unspecified jaundice: Secondary | ICD-10-CM | POA: Diagnosis not present

## 2017-04-15 DIAGNOSIS — D509 Iron deficiency anemia, unspecified: Secondary | ICD-10-CM | POA: Diagnosis not present

## 2017-04-15 DIAGNOSIS — R8299 Other abnormal findings in urine: Secondary | ICD-10-CM | POA: Diagnosis not present

## 2017-04-15 DIAGNOSIS — N2581 Secondary hyperparathyroidism of renal origin: Secondary | ICD-10-CM | POA: Diagnosis not present

## 2017-04-15 DIAGNOSIS — Z79899 Other long term (current) drug therapy: Secondary | ICD-10-CM | POA: Diagnosis not present

## 2017-04-15 DIAGNOSIS — Z23 Encounter for immunization: Secondary | ICD-10-CM | POA: Diagnosis not present

## 2017-04-16 DIAGNOSIS — Z23 Encounter for immunization: Secondary | ICD-10-CM | POA: Diagnosis not present

## 2017-04-16 DIAGNOSIS — R17 Unspecified jaundice: Secondary | ICD-10-CM | POA: Diagnosis not present

## 2017-04-16 DIAGNOSIS — N186 End stage renal disease: Secondary | ICD-10-CM | POA: Diagnosis not present

## 2017-04-16 DIAGNOSIS — Z79899 Other long term (current) drug therapy: Secondary | ICD-10-CM | POA: Diagnosis not present

## 2017-04-16 DIAGNOSIS — N2581 Secondary hyperparathyroidism of renal origin: Secondary | ICD-10-CM | POA: Diagnosis not present

## 2017-04-16 DIAGNOSIS — D509 Iron deficiency anemia, unspecified: Secondary | ICD-10-CM | POA: Diagnosis not present

## 2017-04-16 DIAGNOSIS — D631 Anemia in chronic kidney disease: Secondary | ICD-10-CM | POA: Diagnosis not present

## 2017-04-16 DIAGNOSIS — R8299 Other abnormal findings in urine: Secondary | ICD-10-CM | POA: Diagnosis not present

## 2017-04-16 DIAGNOSIS — E440 Moderate protein-calorie malnutrition: Secondary | ICD-10-CM | POA: Diagnosis not present

## 2017-04-17 DIAGNOSIS — D509 Iron deficiency anemia, unspecified: Secondary | ICD-10-CM | POA: Diagnosis not present

## 2017-04-17 DIAGNOSIS — R8299 Other abnormal findings in urine: Secondary | ICD-10-CM | POA: Diagnosis not present

## 2017-04-17 DIAGNOSIS — R17 Unspecified jaundice: Secondary | ICD-10-CM | POA: Diagnosis not present

## 2017-04-17 DIAGNOSIS — N186 End stage renal disease: Secondary | ICD-10-CM | POA: Diagnosis not present

## 2017-04-17 DIAGNOSIS — N2581 Secondary hyperparathyroidism of renal origin: Secondary | ICD-10-CM | POA: Diagnosis not present

## 2017-04-17 DIAGNOSIS — Z79899 Other long term (current) drug therapy: Secondary | ICD-10-CM | POA: Diagnosis not present

## 2017-04-17 DIAGNOSIS — D631 Anemia in chronic kidney disease: Secondary | ICD-10-CM | POA: Diagnosis not present

## 2017-04-17 DIAGNOSIS — Z23 Encounter for immunization: Secondary | ICD-10-CM | POA: Diagnosis not present

## 2017-04-17 DIAGNOSIS — E440 Moderate protein-calorie malnutrition: Secondary | ICD-10-CM | POA: Diagnosis not present

## 2017-04-18 DIAGNOSIS — N2581 Secondary hyperparathyroidism of renal origin: Secondary | ICD-10-CM | POA: Diagnosis not present

## 2017-04-18 DIAGNOSIS — D509 Iron deficiency anemia, unspecified: Secondary | ICD-10-CM | POA: Diagnosis not present

## 2017-04-18 DIAGNOSIS — Z23 Encounter for immunization: Secondary | ICD-10-CM | POA: Diagnosis not present

## 2017-04-18 DIAGNOSIS — N186 End stage renal disease: Secondary | ICD-10-CM | POA: Diagnosis not present

## 2017-04-18 DIAGNOSIS — E440 Moderate protein-calorie malnutrition: Secondary | ICD-10-CM | POA: Diagnosis not present

## 2017-04-18 DIAGNOSIS — D631 Anemia in chronic kidney disease: Secondary | ICD-10-CM | POA: Diagnosis not present

## 2017-04-18 DIAGNOSIS — R17 Unspecified jaundice: Secondary | ICD-10-CM | POA: Diagnosis not present

## 2017-04-18 DIAGNOSIS — Z79899 Other long term (current) drug therapy: Secondary | ICD-10-CM | POA: Diagnosis not present

## 2017-04-18 DIAGNOSIS — R8299 Other abnormal findings in urine: Secondary | ICD-10-CM | POA: Diagnosis not present

## 2017-04-19 DIAGNOSIS — E1122 Type 2 diabetes mellitus with diabetic chronic kidney disease: Secondary | ICD-10-CM | POA: Diagnosis not present

## 2017-04-19 DIAGNOSIS — Z79899 Other long term (current) drug therapy: Secondary | ICD-10-CM | POA: Diagnosis not present

## 2017-04-19 DIAGNOSIS — N186 End stage renal disease: Secondary | ICD-10-CM | POA: Diagnosis not present

## 2017-04-19 DIAGNOSIS — N2581 Secondary hyperparathyroidism of renal origin: Secondary | ICD-10-CM | POA: Diagnosis not present

## 2017-04-19 DIAGNOSIS — D631 Anemia in chronic kidney disease: Secondary | ICD-10-CM | POA: Diagnosis not present

## 2017-04-19 DIAGNOSIS — Z992 Dependence on renal dialysis: Secondary | ICD-10-CM | POA: Diagnosis not present

## 2017-04-19 DIAGNOSIS — R17 Unspecified jaundice: Secondary | ICD-10-CM | POA: Diagnosis not present

## 2017-04-19 DIAGNOSIS — R8299 Other abnormal findings in urine: Secondary | ICD-10-CM | POA: Diagnosis not present

## 2017-04-19 DIAGNOSIS — D509 Iron deficiency anemia, unspecified: Secondary | ICD-10-CM | POA: Diagnosis not present

## 2017-04-19 DIAGNOSIS — Z23 Encounter for immunization: Secondary | ICD-10-CM | POA: Diagnosis not present

## 2017-04-19 DIAGNOSIS — E440 Moderate protein-calorie malnutrition: Secondary | ICD-10-CM | POA: Diagnosis not present

## 2017-04-20 DIAGNOSIS — E440 Moderate protein-calorie malnutrition: Secondary | ICD-10-CM | POA: Diagnosis not present

## 2017-04-20 DIAGNOSIS — R8299 Other abnormal findings in urine: Secondary | ICD-10-CM | POA: Diagnosis not present

## 2017-04-20 DIAGNOSIS — N2581 Secondary hyperparathyroidism of renal origin: Secondary | ICD-10-CM | POA: Diagnosis not present

## 2017-04-20 DIAGNOSIS — N186 End stage renal disease: Secondary | ICD-10-CM | POA: Diagnosis not present

## 2017-04-20 DIAGNOSIS — Z23 Encounter for immunization: Secondary | ICD-10-CM | POA: Diagnosis not present

## 2017-04-20 DIAGNOSIS — R17 Unspecified jaundice: Secondary | ICD-10-CM | POA: Diagnosis not present

## 2017-04-20 DIAGNOSIS — D509 Iron deficiency anemia, unspecified: Secondary | ICD-10-CM | POA: Diagnosis not present

## 2017-04-20 DIAGNOSIS — D631 Anemia in chronic kidney disease: Secondary | ICD-10-CM | POA: Diagnosis not present

## 2017-04-20 DIAGNOSIS — Z79899 Other long term (current) drug therapy: Secondary | ICD-10-CM | POA: Diagnosis not present

## 2017-04-21 DIAGNOSIS — Z23 Encounter for immunization: Secondary | ICD-10-CM | POA: Diagnosis not present

## 2017-04-21 DIAGNOSIS — D631 Anemia in chronic kidney disease: Secondary | ICD-10-CM | POA: Diagnosis not present

## 2017-04-21 DIAGNOSIS — N186 End stage renal disease: Secondary | ICD-10-CM | POA: Diagnosis not present

## 2017-04-21 DIAGNOSIS — R17 Unspecified jaundice: Secondary | ICD-10-CM | POA: Diagnosis not present

## 2017-04-21 DIAGNOSIS — E440 Moderate protein-calorie malnutrition: Secondary | ICD-10-CM | POA: Diagnosis not present

## 2017-04-21 DIAGNOSIS — R8299 Other abnormal findings in urine: Secondary | ICD-10-CM | POA: Diagnosis not present

## 2017-04-21 DIAGNOSIS — N2581 Secondary hyperparathyroidism of renal origin: Secondary | ICD-10-CM | POA: Diagnosis not present

## 2017-04-21 DIAGNOSIS — D509 Iron deficiency anemia, unspecified: Secondary | ICD-10-CM | POA: Diagnosis not present

## 2017-04-21 DIAGNOSIS — Z79899 Other long term (current) drug therapy: Secondary | ICD-10-CM | POA: Diagnosis not present

## 2017-04-22 DIAGNOSIS — Z23 Encounter for immunization: Secondary | ICD-10-CM | POA: Diagnosis not present

## 2017-04-22 DIAGNOSIS — N186 End stage renal disease: Secondary | ICD-10-CM | POA: Diagnosis not present

## 2017-04-22 DIAGNOSIS — D631 Anemia in chronic kidney disease: Secondary | ICD-10-CM | POA: Diagnosis not present

## 2017-04-22 DIAGNOSIS — E440 Moderate protein-calorie malnutrition: Secondary | ICD-10-CM | POA: Diagnosis not present

## 2017-04-22 DIAGNOSIS — R8299 Other abnormal findings in urine: Secondary | ICD-10-CM | POA: Diagnosis not present

## 2017-04-22 DIAGNOSIS — Z79899 Other long term (current) drug therapy: Secondary | ICD-10-CM | POA: Diagnosis not present

## 2017-04-22 DIAGNOSIS — N2581 Secondary hyperparathyroidism of renal origin: Secondary | ICD-10-CM | POA: Diagnosis not present

## 2017-04-22 DIAGNOSIS — D509 Iron deficiency anemia, unspecified: Secondary | ICD-10-CM | POA: Diagnosis not present

## 2017-04-22 DIAGNOSIS — R17 Unspecified jaundice: Secondary | ICD-10-CM | POA: Diagnosis not present

## 2017-04-23 DIAGNOSIS — D509 Iron deficiency anemia, unspecified: Secondary | ICD-10-CM | POA: Diagnosis not present

## 2017-04-23 DIAGNOSIS — N2581 Secondary hyperparathyroidism of renal origin: Secondary | ICD-10-CM | POA: Diagnosis not present

## 2017-04-23 DIAGNOSIS — R17 Unspecified jaundice: Secondary | ICD-10-CM | POA: Diagnosis not present

## 2017-04-23 DIAGNOSIS — R8299 Other abnormal findings in urine: Secondary | ICD-10-CM | POA: Diagnosis not present

## 2017-04-23 DIAGNOSIS — N186 End stage renal disease: Secondary | ICD-10-CM | POA: Diagnosis not present

## 2017-04-23 DIAGNOSIS — Z79899 Other long term (current) drug therapy: Secondary | ICD-10-CM | POA: Diagnosis not present

## 2017-04-23 DIAGNOSIS — E440 Moderate protein-calorie malnutrition: Secondary | ICD-10-CM | POA: Diagnosis not present

## 2017-04-23 DIAGNOSIS — Z23 Encounter for immunization: Secondary | ICD-10-CM | POA: Diagnosis not present

## 2017-04-23 DIAGNOSIS — D631 Anemia in chronic kidney disease: Secondary | ICD-10-CM | POA: Diagnosis not present

## 2017-04-24 ENCOUNTER — Encounter (INDEPENDENT_AMBULATORY_CARE_PROVIDER_SITE_OTHER): Payer: Medicare Other | Admitting: Ophthalmology

## 2017-04-24 DIAGNOSIS — R17 Unspecified jaundice: Secondary | ICD-10-CM | POA: Diagnosis not present

## 2017-04-24 DIAGNOSIS — E113311 Type 2 diabetes mellitus with moderate nonproliferative diabetic retinopathy with macular edema, right eye: Secondary | ICD-10-CM | POA: Diagnosis not present

## 2017-04-24 DIAGNOSIS — E440 Moderate protein-calorie malnutrition: Secondary | ICD-10-CM | POA: Diagnosis not present

## 2017-04-24 DIAGNOSIS — R8299 Other abnormal findings in urine: Secondary | ICD-10-CM | POA: Diagnosis not present

## 2017-04-24 DIAGNOSIS — Z23 Encounter for immunization: Secondary | ICD-10-CM | POA: Diagnosis not present

## 2017-04-24 DIAGNOSIS — I10 Essential (primary) hypertension: Secondary | ICD-10-CM | POA: Diagnosis not present

## 2017-04-24 DIAGNOSIS — H35033 Hypertensive retinopathy, bilateral: Secondary | ICD-10-CM

## 2017-04-24 DIAGNOSIS — D631 Anemia in chronic kidney disease: Secondary | ICD-10-CM | POA: Diagnosis not present

## 2017-04-24 DIAGNOSIS — H43813 Vitreous degeneration, bilateral: Secondary | ICD-10-CM

## 2017-04-24 DIAGNOSIS — N2581 Secondary hyperparathyroidism of renal origin: Secondary | ICD-10-CM | POA: Diagnosis not present

## 2017-04-24 DIAGNOSIS — E11311 Type 2 diabetes mellitus with unspecified diabetic retinopathy with macular edema: Secondary | ICD-10-CM

## 2017-04-24 DIAGNOSIS — Z79899 Other long term (current) drug therapy: Secondary | ICD-10-CM | POA: Diagnosis not present

## 2017-04-24 DIAGNOSIS — D509 Iron deficiency anemia, unspecified: Secondary | ICD-10-CM | POA: Diagnosis not present

## 2017-04-24 DIAGNOSIS — N186 End stage renal disease: Secondary | ICD-10-CM | POA: Diagnosis not present

## 2017-04-24 DIAGNOSIS — E113391 Type 2 diabetes mellitus with moderate nonproliferative diabetic retinopathy without macular edema, right eye: Secondary | ICD-10-CM

## 2017-04-25 DIAGNOSIS — Z79899 Other long term (current) drug therapy: Secondary | ICD-10-CM | POA: Diagnosis not present

## 2017-04-25 DIAGNOSIS — R17 Unspecified jaundice: Secondary | ICD-10-CM | POA: Diagnosis not present

## 2017-04-25 DIAGNOSIS — D631 Anemia in chronic kidney disease: Secondary | ICD-10-CM | POA: Diagnosis not present

## 2017-04-25 DIAGNOSIS — E440 Moderate protein-calorie malnutrition: Secondary | ICD-10-CM | POA: Diagnosis not present

## 2017-04-25 DIAGNOSIS — R8299 Other abnormal findings in urine: Secondary | ICD-10-CM | POA: Diagnosis not present

## 2017-04-25 DIAGNOSIS — Z23 Encounter for immunization: Secondary | ICD-10-CM | POA: Diagnosis not present

## 2017-04-25 DIAGNOSIS — N186 End stage renal disease: Secondary | ICD-10-CM | POA: Diagnosis not present

## 2017-04-25 DIAGNOSIS — D509 Iron deficiency anemia, unspecified: Secondary | ICD-10-CM | POA: Diagnosis not present

## 2017-04-25 DIAGNOSIS — N2581 Secondary hyperparathyroidism of renal origin: Secondary | ICD-10-CM | POA: Diagnosis not present

## 2017-04-26 DIAGNOSIS — R8299 Other abnormal findings in urine: Secondary | ICD-10-CM | POA: Diagnosis not present

## 2017-04-26 DIAGNOSIS — Z79899 Other long term (current) drug therapy: Secondary | ICD-10-CM | POA: Diagnosis not present

## 2017-04-26 DIAGNOSIS — D631 Anemia in chronic kidney disease: Secondary | ICD-10-CM | POA: Diagnosis not present

## 2017-04-26 DIAGNOSIS — E440 Moderate protein-calorie malnutrition: Secondary | ICD-10-CM | POA: Diagnosis not present

## 2017-04-26 DIAGNOSIS — R17 Unspecified jaundice: Secondary | ICD-10-CM | POA: Diagnosis not present

## 2017-04-26 DIAGNOSIS — N186 End stage renal disease: Secondary | ICD-10-CM | POA: Diagnosis not present

## 2017-04-26 DIAGNOSIS — D509 Iron deficiency anemia, unspecified: Secondary | ICD-10-CM | POA: Diagnosis not present

## 2017-04-26 DIAGNOSIS — N2581 Secondary hyperparathyroidism of renal origin: Secondary | ICD-10-CM | POA: Diagnosis not present

## 2017-04-26 DIAGNOSIS — Z23 Encounter for immunization: Secondary | ICD-10-CM | POA: Diagnosis not present

## 2017-04-27 DIAGNOSIS — Z23 Encounter for immunization: Secondary | ICD-10-CM | POA: Diagnosis not present

## 2017-04-27 DIAGNOSIS — N2581 Secondary hyperparathyroidism of renal origin: Secondary | ICD-10-CM | POA: Diagnosis not present

## 2017-04-27 DIAGNOSIS — D631 Anemia in chronic kidney disease: Secondary | ICD-10-CM | POA: Diagnosis not present

## 2017-04-27 DIAGNOSIS — E440 Moderate protein-calorie malnutrition: Secondary | ICD-10-CM | POA: Diagnosis not present

## 2017-04-27 DIAGNOSIS — R8299 Other abnormal findings in urine: Secondary | ICD-10-CM | POA: Diagnosis not present

## 2017-04-27 DIAGNOSIS — R17 Unspecified jaundice: Secondary | ICD-10-CM | POA: Diagnosis not present

## 2017-04-27 DIAGNOSIS — Z79899 Other long term (current) drug therapy: Secondary | ICD-10-CM | POA: Diagnosis not present

## 2017-04-27 DIAGNOSIS — D509 Iron deficiency anemia, unspecified: Secondary | ICD-10-CM | POA: Diagnosis not present

## 2017-04-27 DIAGNOSIS — N186 End stage renal disease: Secondary | ICD-10-CM | POA: Diagnosis not present

## 2017-04-28 DIAGNOSIS — D509 Iron deficiency anemia, unspecified: Secondary | ICD-10-CM | POA: Diagnosis not present

## 2017-04-28 DIAGNOSIS — R8299 Other abnormal findings in urine: Secondary | ICD-10-CM | POA: Diagnosis not present

## 2017-04-28 DIAGNOSIS — Z23 Encounter for immunization: Secondary | ICD-10-CM | POA: Diagnosis not present

## 2017-04-28 DIAGNOSIS — N186 End stage renal disease: Secondary | ICD-10-CM | POA: Diagnosis not present

## 2017-04-28 DIAGNOSIS — E440 Moderate protein-calorie malnutrition: Secondary | ICD-10-CM | POA: Diagnosis not present

## 2017-04-28 DIAGNOSIS — Z79899 Other long term (current) drug therapy: Secondary | ICD-10-CM | POA: Diagnosis not present

## 2017-04-28 DIAGNOSIS — D631 Anemia in chronic kidney disease: Secondary | ICD-10-CM | POA: Diagnosis not present

## 2017-04-28 DIAGNOSIS — N2581 Secondary hyperparathyroidism of renal origin: Secondary | ICD-10-CM | POA: Diagnosis not present

## 2017-04-28 DIAGNOSIS — R17 Unspecified jaundice: Secondary | ICD-10-CM | POA: Diagnosis not present

## 2017-04-29 DIAGNOSIS — N2581 Secondary hyperparathyroidism of renal origin: Secondary | ICD-10-CM | POA: Diagnosis not present

## 2017-04-29 DIAGNOSIS — R8299 Other abnormal findings in urine: Secondary | ICD-10-CM | POA: Diagnosis not present

## 2017-04-29 DIAGNOSIS — Z79899 Other long term (current) drug therapy: Secondary | ICD-10-CM | POA: Diagnosis not present

## 2017-04-29 DIAGNOSIS — E440 Moderate protein-calorie malnutrition: Secondary | ICD-10-CM | POA: Diagnosis not present

## 2017-04-29 DIAGNOSIS — R17 Unspecified jaundice: Secondary | ICD-10-CM | POA: Diagnosis not present

## 2017-04-29 DIAGNOSIS — D509 Iron deficiency anemia, unspecified: Secondary | ICD-10-CM | POA: Diagnosis not present

## 2017-04-29 DIAGNOSIS — Z23 Encounter for immunization: Secondary | ICD-10-CM | POA: Diagnosis not present

## 2017-04-29 DIAGNOSIS — N186 End stage renal disease: Secondary | ICD-10-CM | POA: Diagnosis not present

## 2017-04-29 DIAGNOSIS — D631 Anemia in chronic kidney disease: Secondary | ICD-10-CM | POA: Diagnosis not present

## 2017-04-30 DIAGNOSIS — Z23 Encounter for immunization: Secondary | ICD-10-CM | POA: Diagnosis not present

## 2017-04-30 DIAGNOSIS — N2581 Secondary hyperparathyroidism of renal origin: Secondary | ICD-10-CM | POA: Diagnosis not present

## 2017-04-30 DIAGNOSIS — D509 Iron deficiency anemia, unspecified: Secondary | ICD-10-CM | POA: Diagnosis not present

## 2017-04-30 DIAGNOSIS — D631 Anemia in chronic kidney disease: Secondary | ICD-10-CM | POA: Diagnosis not present

## 2017-04-30 DIAGNOSIS — N186 End stage renal disease: Secondary | ICD-10-CM | POA: Diagnosis not present

## 2017-04-30 DIAGNOSIS — R17 Unspecified jaundice: Secondary | ICD-10-CM | POA: Diagnosis not present

## 2017-04-30 DIAGNOSIS — R8299 Other abnormal findings in urine: Secondary | ICD-10-CM | POA: Diagnosis not present

## 2017-04-30 DIAGNOSIS — Z79899 Other long term (current) drug therapy: Secondary | ICD-10-CM | POA: Diagnosis not present

## 2017-04-30 DIAGNOSIS — E440 Moderate protein-calorie malnutrition: Secondary | ICD-10-CM | POA: Diagnosis not present

## 2017-05-01 DIAGNOSIS — Z23 Encounter for immunization: Secondary | ICD-10-CM | POA: Diagnosis not present

## 2017-05-01 DIAGNOSIS — D509 Iron deficiency anemia, unspecified: Secondary | ICD-10-CM | POA: Diagnosis not present

## 2017-05-01 DIAGNOSIS — E440 Moderate protein-calorie malnutrition: Secondary | ICD-10-CM | POA: Diagnosis not present

## 2017-05-01 DIAGNOSIS — R8299 Other abnormal findings in urine: Secondary | ICD-10-CM | POA: Diagnosis not present

## 2017-05-01 DIAGNOSIS — R17 Unspecified jaundice: Secondary | ICD-10-CM | POA: Diagnosis not present

## 2017-05-01 DIAGNOSIS — Z79899 Other long term (current) drug therapy: Secondary | ICD-10-CM | POA: Diagnosis not present

## 2017-05-01 DIAGNOSIS — N186 End stage renal disease: Secondary | ICD-10-CM | POA: Diagnosis not present

## 2017-05-01 DIAGNOSIS — D631 Anemia in chronic kidney disease: Secondary | ICD-10-CM | POA: Diagnosis not present

## 2017-05-01 DIAGNOSIS — N2581 Secondary hyperparathyroidism of renal origin: Secondary | ICD-10-CM | POA: Diagnosis not present

## 2017-05-02 DIAGNOSIS — D631 Anemia in chronic kidney disease: Secondary | ICD-10-CM | POA: Diagnosis not present

## 2017-05-02 DIAGNOSIS — Z23 Encounter for immunization: Secondary | ICD-10-CM | POA: Diagnosis not present

## 2017-05-02 DIAGNOSIS — R8299 Other abnormal findings in urine: Secondary | ICD-10-CM | POA: Diagnosis not present

## 2017-05-02 DIAGNOSIS — D509 Iron deficiency anemia, unspecified: Secondary | ICD-10-CM | POA: Diagnosis not present

## 2017-05-02 DIAGNOSIS — Z79899 Other long term (current) drug therapy: Secondary | ICD-10-CM | POA: Diagnosis not present

## 2017-05-02 DIAGNOSIS — E440 Moderate protein-calorie malnutrition: Secondary | ICD-10-CM | POA: Diagnosis not present

## 2017-05-02 DIAGNOSIS — R17 Unspecified jaundice: Secondary | ICD-10-CM | POA: Diagnosis not present

## 2017-05-02 DIAGNOSIS — N2581 Secondary hyperparathyroidism of renal origin: Secondary | ICD-10-CM | POA: Diagnosis not present

## 2017-05-02 DIAGNOSIS — N186 End stage renal disease: Secondary | ICD-10-CM | POA: Diagnosis not present

## 2017-05-03 DIAGNOSIS — Z79899 Other long term (current) drug therapy: Secondary | ICD-10-CM | POA: Diagnosis not present

## 2017-05-03 DIAGNOSIS — N186 End stage renal disease: Secondary | ICD-10-CM | POA: Diagnosis not present

## 2017-05-03 DIAGNOSIS — N2581 Secondary hyperparathyroidism of renal origin: Secondary | ICD-10-CM | POA: Diagnosis not present

## 2017-05-03 DIAGNOSIS — R17 Unspecified jaundice: Secondary | ICD-10-CM | POA: Diagnosis not present

## 2017-05-03 DIAGNOSIS — D509 Iron deficiency anemia, unspecified: Secondary | ICD-10-CM | POA: Diagnosis not present

## 2017-05-03 DIAGNOSIS — R8299 Other abnormal findings in urine: Secondary | ICD-10-CM | POA: Diagnosis not present

## 2017-05-03 DIAGNOSIS — E440 Moderate protein-calorie malnutrition: Secondary | ICD-10-CM | POA: Diagnosis not present

## 2017-05-03 DIAGNOSIS — D631 Anemia in chronic kidney disease: Secondary | ICD-10-CM | POA: Diagnosis not present

## 2017-05-03 DIAGNOSIS — Z23 Encounter for immunization: Secondary | ICD-10-CM | POA: Diagnosis not present

## 2017-05-04 DIAGNOSIS — E440 Moderate protein-calorie malnutrition: Secondary | ICD-10-CM | POA: Diagnosis not present

## 2017-05-04 DIAGNOSIS — Z79899 Other long term (current) drug therapy: Secondary | ICD-10-CM | POA: Diagnosis not present

## 2017-05-04 DIAGNOSIS — N186 End stage renal disease: Secondary | ICD-10-CM | POA: Diagnosis not present

## 2017-05-04 DIAGNOSIS — D631 Anemia in chronic kidney disease: Secondary | ICD-10-CM | POA: Diagnosis not present

## 2017-05-04 DIAGNOSIS — N2581 Secondary hyperparathyroidism of renal origin: Secondary | ICD-10-CM | POA: Diagnosis not present

## 2017-05-04 DIAGNOSIS — R8299 Other abnormal findings in urine: Secondary | ICD-10-CM | POA: Diagnosis not present

## 2017-05-04 DIAGNOSIS — R17 Unspecified jaundice: Secondary | ICD-10-CM | POA: Diagnosis not present

## 2017-05-04 DIAGNOSIS — Z23 Encounter for immunization: Secondary | ICD-10-CM | POA: Diagnosis not present

## 2017-05-04 DIAGNOSIS — D509 Iron deficiency anemia, unspecified: Secondary | ICD-10-CM | POA: Diagnosis not present

## 2017-05-05 DIAGNOSIS — D631 Anemia in chronic kidney disease: Secondary | ICD-10-CM | POA: Diagnosis not present

## 2017-05-05 DIAGNOSIS — R8299 Other abnormal findings in urine: Secondary | ICD-10-CM | POA: Diagnosis not present

## 2017-05-05 DIAGNOSIS — Z79899 Other long term (current) drug therapy: Secondary | ICD-10-CM | POA: Diagnosis not present

## 2017-05-05 DIAGNOSIS — R17 Unspecified jaundice: Secondary | ICD-10-CM | POA: Diagnosis not present

## 2017-05-05 DIAGNOSIS — N186 End stage renal disease: Secondary | ICD-10-CM | POA: Diagnosis not present

## 2017-05-05 DIAGNOSIS — Z23 Encounter for immunization: Secondary | ICD-10-CM | POA: Diagnosis not present

## 2017-05-05 DIAGNOSIS — D509 Iron deficiency anemia, unspecified: Secondary | ICD-10-CM | POA: Diagnosis not present

## 2017-05-05 DIAGNOSIS — N2581 Secondary hyperparathyroidism of renal origin: Secondary | ICD-10-CM | POA: Diagnosis not present

## 2017-05-05 DIAGNOSIS — E440 Moderate protein-calorie malnutrition: Secondary | ICD-10-CM | POA: Diagnosis not present

## 2017-05-06 DIAGNOSIS — D631 Anemia in chronic kidney disease: Secondary | ICD-10-CM | POA: Diagnosis not present

## 2017-05-06 DIAGNOSIS — Z79899 Other long term (current) drug therapy: Secondary | ICD-10-CM | POA: Diagnosis not present

## 2017-05-06 DIAGNOSIS — D509 Iron deficiency anemia, unspecified: Secondary | ICD-10-CM | POA: Diagnosis not present

## 2017-05-06 DIAGNOSIS — N2581 Secondary hyperparathyroidism of renal origin: Secondary | ICD-10-CM | POA: Diagnosis not present

## 2017-05-06 DIAGNOSIS — Z23 Encounter for immunization: Secondary | ICD-10-CM | POA: Diagnosis not present

## 2017-05-06 DIAGNOSIS — E440 Moderate protein-calorie malnutrition: Secondary | ICD-10-CM | POA: Diagnosis not present

## 2017-05-06 DIAGNOSIS — R8299 Other abnormal findings in urine: Secondary | ICD-10-CM | POA: Diagnosis not present

## 2017-05-06 DIAGNOSIS — R17 Unspecified jaundice: Secondary | ICD-10-CM | POA: Diagnosis not present

## 2017-05-06 DIAGNOSIS — N186 End stage renal disease: Secondary | ICD-10-CM | POA: Diagnosis not present

## 2017-05-07 DIAGNOSIS — D631 Anemia in chronic kidney disease: Secondary | ICD-10-CM | POA: Diagnosis not present

## 2017-05-07 DIAGNOSIS — E440 Moderate protein-calorie malnutrition: Secondary | ICD-10-CM | POA: Diagnosis not present

## 2017-05-07 DIAGNOSIS — Z23 Encounter for immunization: Secondary | ICD-10-CM | POA: Diagnosis not present

## 2017-05-07 DIAGNOSIS — R8299 Other abnormal findings in urine: Secondary | ICD-10-CM | POA: Diagnosis not present

## 2017-05-07 DIAGNOSIS — D509 Iron deficiency anemia, unspecified: Secondary | ICD-10-CM | POA: Diagnosis not present

## 2017-05-07 DIAGNOSIS — N2581 Secondary hyperparathyroidism of renal origin: Secondary | ICD-10-CM | POA: Diagnosis not present

## 2017-05-07 DIAGNOSIS — Z79899 Other long term (current) drug therapy: Secondary | ICD-10-CM | POA: Diagnosis not present

## 2017-05-07 DIAGNOSIS — N186 End stage renal disease: Secondary | ICD-10-CM | POA: Diagnosis not present

## 2017-05-07 DIAGNOSIS — R17 Unspecified jaundice: Secondary | ICD-10-CM | POA: Diagnosis not present

## 2017-05-08 DIAGNOSIS — E440 Moderate protein-calorie malnutrition: Secondary | ICD-10-CM | POA: Diagnosis not present

## 2017-05-08 DIAGNOSIS — R17 Unspecified jaundice: Secondary | ICD-10-CM | POA: Diagnosis not present

## 2017-05-08 DIAGNOSIS — D631 Anemia in chronic kidney disease: Secondary | ICD-10-CM | POA: Diagnosis not present

## 2017-05-08 DIAGNOSIS — N186 End stage renal disease: Secondary | ICD-10-CM | POA: Diagnosis not present

## 2017-05-08 DIAGNOSIS — N2581 Secondary hyperparathyroidism of renal origin: Secondary | ICD-10-CM | POA: Diagnosis not present

## 2017-05-08 DIAGNOSIS — R8299 Other abnormal findings in urine: Secondary | ICD-10-CM | POA: Diagnosis not present

## 2017-05-08 DIAGNOSIS — Z79899 Other long term (current) drug therapy: Secondary | ICD-10-CM | POA: Diagnosis not present

## 2017-05-08 DIAGNOSIS — Z23 Encounter for immunization: Secondary | ICD-10-CM | POA: Diagnosis not present

## 2017-05-08 DIAGNOSIS — D509 Iron deficiency anemia, unspecified: Secondary | ICD-10-CM | POA: Diagnosis not present

## 2017-05-09 DIAGNOSIS — D631 Anemia in chronic kidney disease: Secondary | ICD-10-CM | POA: Diagnosis not present

## 2017-05-09 DIAGNOSIS — Z79899 Other long term (current) drug therapy: Secondary | ICD-10-CM | POA: Diagnosis not present

## 2017-05-09 DIAGNOSIS — D509 Iron deficiency anemia, unspecified: Secondary | ICD-10-CM | POA: Diagnosis not present

## 2017-05-09 DIAGNOSIS — Z23 Encounter for immunization: Secondary | ICD-10-CM | POA: Diagnosis not present

## 2017-05-09 DIAGNOSIS — R8299 Other abnormal findings in urine: Secondary | ICD-10-CM | POA: Diagnosis not present

## 2017-05-09 DIAGNOSIS — N2581 Secondary hyperparathyroidism of renal origin: Secondary | ICD-10-CM | POA: Diagnosis not present

## 2017-05-09 DIAGNOSIS — R17 Unspecified jaundice: Secondary | ICD-10-CM | POA: Diagnosis not present

## 2017-05-09 DIAGNOSIS — E440 Moderate protein-calorie malnutrition: Secondary | ICD-10-CM | POA: Diagnosis not present

## 2017-05-09 DIAGNOSIS — N186 End stage renal disease: Secondary | ICD-10-CM | POA: Diagnosis not present

## 2017-05-10 DIAGNOSIS — R17 Unspecified jaundice: Secondary | ICD-10-CM | POA: Diagnosis not present

## 2017-05-10 DIAGNOSIS — D631 Anemia in chronic kidney disease: Secondary | ICD-10-CM | POA: Diagnosis not present

## 2017-05-10 DIAGNOSIS — Z79899 Other long term (current) drug therapy: Secondary | ICD-10-CM | POA: Diagnosis not present

## 2017-05-10 DIAGNOSIS — N186 End stage renal disease: Secondary | ICD-10-CM | POA: Diagnosis not present

## 2017-05-10 DIAGNOSIS — Z23 Encounter for immunization: Secondary | ICD-10-CM | POA: Diagnosis not present

## 2017-05-10 DIAGNOSIS — E440 Moderate protein-calorie malnutrition: Secondary | ICD-10-CM | POA: Diagnosis not present

## 2017-05-10 DIAGNOSIS — N2581 Secondary hyperparathyroidism of renal origin: Secondary | ICD-10-CM | POA: Diagnosis not present

## 2017-05-10 DIAGNOSIS — R8299 Other abnormal findings in urine: Secondary | ICD-10-CM | POA: Diagnosis not present

## 2017-05-10 DIAGNOSIS — D509 Iron deficiency anemia, unspecified: Secondary | ICD-10-CM | POA: Diagnosis not present

## 2017-05-11 DIAGNOSIS — D509 Iron deficiency anemia, unspecified: Secondary | ICD-10-CM | POA: Diagnosis not present

## 2017-05-11 DIAGNOSIS — R17 Unspecified jaundice: Secondary | ICD-10-CM | POA: Diagnosis not present

## 2017-05-11 DIAGNOSIS — N186 End stage renal disease: Secondary | ICD-10-CM | POA: Diagnosis not present

## 2017-05-11 DIAGNOSIS — N2581 Secondary hyperparathyroidism of renal origin: Secondary | ICD-10-CM | POA: Diagnosis not present

## 2017-05-11 DIAGNOSIS — R8299 Other abnormal findings in urine: Secondary | ICD-10-CM | POA: Diagnosis not present

## 2017-05-11 DIAGNOSIS — Z79899 Other long term (current) drug therapy: Secondary | ICD-10-CM | POA: Diagnosis not present

## 2017-05-11 DIAGNOSIS — D631 Anemia in chronic kidney disease: Secondary | ICD-10-CM | POA: Diagnosis not present

## 2017-05-11 DIAGNOSIS — E440 Moderate protein-calorie malnutrition: Secondary | ICD-10-CM | POA: Diagnosis not present

## 2017-05-11 DIAGNOSIS — Z23 Encounter for immunization: Secondary | ICD-10-CM | POA: Diagnosis not present

## 2017-05-12 DIAGNOSIS — N186 End stage renal disease: Secondary | ICD-10-CM | POA: Diagnosis not present

## 2017-05-12 DIAGNOSIS — Z79899 Other long term (current) drug therapy: Secondary | ICD-10-CM | POA: Diagnosis not present

## 2017-05-12 DIAGNOSIS — D509 Iron deficiency anemia, unspecified: Secondary | ICD-10-CM | POA: Diagnosis not present

## 2017-05-12 DIAGNOSIS — Z23 Encounter for immunization: Secondary | ICD-10-CM | POA: Diagnosis not present

## 2017-05-12 DIAGNOSIS — R8299 Other abnormal findings in urine: Secondary | ICD-10-CM | POA: Diagnosis not present

## 2017-05-12 DIAGNOSIS — R17 Unspecified jaundice: Secondary | ICD-10-CM | POA: Diagnosis not present

## 2017-05-12 DIAGNOSIS — E440 Moderate protein-calorie malnutrition: Secondary | ICD-10-CM | POA: Diagnosis not present

## 2017-05-12 DIAGNOSIS — N2581 Secondary hyperparathyroidism of renal origin: Secondary | ICD-10-CM | POA: Diagnosis not present

## 2017-05-12 DIAGNOSIS — D631 Anemia in chronic kidney disease: Secondary | ICD-10-CM | POA: Diagnosis not present

## 2017-05-13 DIAGNOSIS — N186 End stage renal disease: Secondary | ICD-10-CM | POA: Diagnosis not present

## 2017-05-13 DIAGNOSIS — D631 Anemia in chronic kidney disease: Secondary | ICD-10-CM | POA: Diagnosis not present

## 2017-05-13 DIAGNOSIS — Z23 Encounter for immunization: Secondary | ICD-10-CM | POA: Diagnosis not present

## 2017-05-13 DIAGNOSIS — D509 Iron deficiency anemia, unspecified: Secondary | ICD-10-CM | POA: Diagnosis not present

## 2017-05-13 DIAGNOSIS — R8299 Other abnormal findings in urine: Secondary | ICD-10-CM | POA: Diagnosis not present

## 2017-05-13 DIAGNOSIS — N2581 Secondary hyperparathyroidism of renal origin: Secondary | ICD-10-CM | POA: Diagnosis not present

## 2017-05-13 DIAGNOSIS — R17 Unspecified jaundice: Secondary | ICD-10-CM | POA: Diagnosis not present

## 2017-05-13 DIAGNOSIS — Z79899 Other long term (current) drug therapy: Secondary | ICD-10-CM | POA: Diagnosis not present

## 2017-05-13 DIAGNOSIS — E440 Moderate protein-calorie malnutrition: Secondary | ICD-10-CM | POA: Diagnosis not present

## 2017-05-14 ENCOUNTER — Other Ambulatory Visit: Payer: Self-pay | Admitting: Internal Medicine

## 2017-05-14 DIAGNOSIS — D631 Anemia in chronic kidney disease: Secondary | ICD-10-CM | POA: Diagnosis not present

## 2017-05-14 DIAGNOSIS — N186 End stage renal disease: Secondary | ICD-10-CM | POA: Diagnosis not present

## 2017-05-14 DIAGNOSIS — Z23 Encounter for immunization: Secondary | ICD-10-CM | POA: Diagnosis not present

## 2017-05-14 DIAGNOSIS — Z79899 Other long term (current) drug therapy: Secondary | ICD-10-CM | POA: Diagnosis not present

## 2017-05-14 DIAGNOSIS — R8299 Other abnormal findings in urine: Secondary | ICD-10-CM | POA: Diagnosis not present

## 2017-05-14 DIAGNOSIS — D509 Iron deficiency anemia, unspecified: Secondary | ICD-10-CM | POA: Diagnosis not present

## 2017-05-14 DIAGNOSIS — R17 Unspecified jaundice: Secondary | ICD-10-CM | POA: Diagnosis not present

## 2017-05-14 DIAGNOSIS — N2581 Secondary hyperparathyroidism of renal origin: Secondary | ICD-10-CM | POA: Diagnosis not present

## 2017-05-14 DIAGNOSIS — E440 Moderate protein-calorie malnutrition: Secondary | ICD-10-CM | POA: Diagnosis not present

## 2017-05-15 DIAGNOSIS — R17 Unspecified jaundice: Secondary | ICD-10-CM | POA: Diagnosis not present

## 2017-05-15 DIAGNOSIS — N186 End stage renal disease: Secondary | ICD-10-CM | POA: Diagnosis not present

## 2017-05-15 DIAGNOSIS — D509 Iron deficiency anemia, unspecified: Secondary | ICD-10-CM | POA: Diagnosis not present

## 2017-05-15 DIAGNOSIS — Z23 Encounter for immunization: Secondary | ICD-10-CM | POA: Diagnosis not present

## 2017-05-15 DIAGNOSIS — R8299 Other abnormal findings in urine: Secondary | ICD-10-CM | POA: Diagnosis not present

## 2017-05-15 DIAGNOSIS — N2581 Secondary hyperparathyroidism of renal origin: Secondary | ICD-10-CM | POA: Diagnosis not present

## 2017-05-15 DIAGNOSIS — D631 Anemia in chronic kidney disease: Secondary | ICD-10-CM | POA: Diagnosis not present

## 2017-05-15 DIAGNOSIS — E440 Moderate protein-calorie malnutrition: Secondary | ICD-10-CM | POA: Diagnosis not present

## 2017-05-15 DIAGNOSIS — Z79899 Other long term (current) drug therapy: Secondary | ICD-10-CM | POA: Diagnosis not present

## 2017-05-16 DIAGNOSIS — D631 Anemia in chronic kidney disease: Secondary | ICD-10-CM | POA: Diagnosis not present

## 2017-05-16 DIAGNOSIS — Z79899 Other long term (current) drug therapy: Secondary | ICD-10-CM | POA: Diagnosis not present

## 2017-05-16 DIAGNOSIS — D509 Iron deficiency anemia, unspecified: Secondary | ICD-10-CM | POA: Diagnosis not present

## 2017-05-16 DIAGNOSIS — R17 Unspecified jaundice: Secondary | ICD-10-CM | POA: Diagnosis not present

## 2017-05-16 DIAGNOSIS — Z23 Encounter for immunization: Secondary | ICD-10-CM | POA: Diagnosis not present

## 2017-05-16 DIAGNOSIS — E440 Moderate protein-calorie malnutrition: Secondary | ICD-10-CM | POA: Diagnosis not present

## 2017-05-16 DIAGNOSIS — N186 End stage renal disease: Secondary | ICD-10-CM | POA: Diagnosis not present

## 2017-05-16 DIAGNOSIS — R8299 Other abnormal findings in urine: Secondary | ICD-10-CM | POA: Diagnosis not present

## 2017-05-16 DIAGNOSIS — N2581 Secondary hyperparathyroidism of renal origin: Secondary | ICD-10-CM | POA: Diagnosis not present

## 2017-05-17 DIAGNOSIS — N2581 Secondary hyperparathyroidism of renal origin: Secondary | ICD-10-CM | POA: Diagnosis not present

## 2017-05-17 DIAGNOSIS — R17 Unspecified jaundice: Secondary | ICD-10-CM | POA: Diagnosis not present

## 2017-05-17 DIAGNOSIS — R8299 Other abnormal findings in urine: Secondary | ICD-10-CM | POA: Diagnosis not present

## 2017-05-17 DIAGNOSIS — Z79899 Other long term (current) drug therapy: Secondary | ICD-10-CM | POA: Diagnosis not present

## 2017-05-17 DIAGNOSIS — Z23 Encounter for immunization: Secondary | ICD-10-CM | POA: Diagnosis not present

## 2017-05-17 DIAGNOSIS — D631 Anemia in chronic kidney disease: Secondary | ICD-10-CM | POA: Diagnosis not present

## 2017-05-17 DIAGNOSIS — E440 Moderate protein-calorie malnutrition: Secondary | ICD-10-CM | POA: Diagnosis not present

## 2017-05-17 DIAGNOSIS — N186 End stage renal disease: Secondary | ICD-10-CM | POA: Diagnosis not present

## 2017-05-17 DIAGNOSIS — D509 Iron deficiency anemia, unspecified: Secondary | ICD-10-CM | POA: Diagnosis not present

## 2017-05-18 DIAGNOSIS — D509 Iron deficiency anemia, unspecified: Secondary | ICD-10-CM | POA: Diagnosis not present

## 2017-05-18 DIAGNOSIS — Z23 Encounter for immunization: Secondary | ICD-10-CM | POA: Diagnosis not present

## 2017-05-18 DIAGNOSIS — D631 Anemia in chronic kidney disease: Secondary | ICD-10-CM | POA: Diagnosis not present

## 2017-05-18 DIAGNOSIS — R17 Unspecified jaundice: Secondary | ICD-10-CM | POA: Diagnosis not present

## 2017-05-18 DIAGNOSIS — N186 End stage renal disease: Secondary | ICD-10-CM | POA: Diagnosis not present

## 2017-05-18 DIAGNOSIS — R8299 Other abnormal findings in urine: Secondary | ICD-10-CM | POA: Diagnosis not present

## 2017-05-18 DIAGNOSIS — E440 Moderate protein-calorie malnutrition: Secondary | ICD-10-CM | POA: Diagnosis not present

## 2017-05-18 DIAGNOSIS — Z79899 Other long term (current) drug therapy: Secondary | ICD-10-CM | POA: Diagnosis not present

## 2017-05-18 DIAGNOSIS — N2581 Secondary hyperparathyroidism of renal origin: Secondary | ICD-10-CM | POA: Diagnosis not present

## 2017-05-19 DIAGNOSIS — N186 End stage renal disease: Secondary | ICD-10-CM | POA: Diagnosis not present

## 2017-05-19 DIAGNOSIS — R17 Unspecified jaundice: Secondary | ICD-10-CM | POA: Diagnosis not present

## 2017-05-19 DIAGNOSIS — Z23 Encounter for immunization: Secondary | ICD-10-CM | POA: Diagnosis not present

## 2017-05-19 DIAGNOSIS — D631 Anemia in chronic kidney disease: Secondary | ICD-10-CM | POA: Diagnosis not present

## 2017-05-19 DIAGNOSIS — E1122 Type 2 diabetes mellitus with diabetic chronic kidney disease: Secondary | ICD-10-CM | POA: Diagnosis not present

## 2017-05-19 DIAGNOSIS — E440 Moderate protein-calorie malnutrition: Secondary | ICD-10-CM | POA: Diagnosis not present

## 2017-05-19 DIAGNOSIS — Z992 Dependence on renal dialysis: Secondary | ICD-10-CM | POA: Diagnosis not present

## 2017-05-19 DIAGNOSIS — Z79899 Other long term (current) drug therapy: Secondary | ICD-10-CM | POA: Diagnosis not present

## 2017-05-19 DIAGNOSIS — N2581 Secondary hyperparathyroidism of renal origin: Secondary | ICD-10-CM | POA: Diagnosis not present

## 2017-05-19 DIAGNOSIS — D509 Iron deficiency anemia, unspecified: Secondary | ICD-10-CM | POA: Diagnosis not present

## 2017-05-19 DIAGNOSIS — R8299 Other abnormal findings in urine: Secondary | ICD-10-CM | POA: Diagnosis not present

## 2017-05-20 DIAGNOSIS — D509 Iron deficiency anemia, unspecified: Secondary | ICD-10-CM | POA: Diagnosis not present

## 2017-05-20 DIAGNOSIS — N2589 Other disorders resulting from impaired renal tubular function: Secondary | ICD-10-CM | POA: Diagnosis not present

## 2017-05-20 DIAGNOSIS — R8299 Other abnormal findings in urine: Secondary | ICD-10-CM | POA: Diagnosis not present

## 2017-05-20 DIAGNOSIS — N186 End stage renal disease: Secondary | ICD-10-CM | POA: Diagnosis not present

## 2017-05-20 DIAGNOSIS — E440 Moderate protein-calorie malnutrition: Secondary | ICD-10-CM | POA: Diagnosis not present

## 2017-05-20 DIAGNOSIS — N2581 Secondary hyperparathyroidism of renal origin: Secondary | ICD-10-CM | POA: Diagnosis not present

## 2017-05-20 DIAGNOSIS — E784 Other hyperlipidemia: Secondary | ICD-10-CM | POA: Diagnosis not present

## 2017-05-20 DIAGNOSIS — Z4932 Encounter for adequacy testing for peritoneal dialysis: Secondary | ICD-10-CM | POA: Diagnosis not present

## 2017-05-20 DIAGNOSIS — E119 Type 2 diabetes mellitus without complications: Secondary | ICD-10-CM | POA: Diagnosis not present

## 2017-05-21 DIAGNOSIS — N2581 Secondary hyperparathyroidism of renal origin: Secondary | ICD-10-CM | POA: Diagnosis not present

## 2017-05-21 DIAGNOSIS — E440 Moderate protein-calorie malnutrition: Secondary | ICD-10-CM | POA: Diagnosis not present

## 2017-05-21 DIAGNOSIS — D509 Iron deficiency anemia, unspecified: Secondary | ICD-10-CM | POA: Diagnosis not present

## 2017-05-21 DIAGNOSIS — Z4932 Encounter for adequacy testing for peritoneal dialysis: Secondary | ICD-10-CM | POA: Diagnosis not present

## 2017-05-21 DIAGNOSIS — E784 Other hyperlipidemia: Secondary | ICD-10-CM | POA: Diagnosis not present

## 2017-05-21 DIAGNOSIS — R8299 Other abnormal findings in urine: Secondary | ICD-10-CM | POA: Diagnosis not present

## 2017-05-21 DIAGNOSIS — E119 Type 2 diabetes mellitus without complications: Secondary | ICD-10-CM | POA: Diagnosis not present

## 2017-05-21 DIAGNOSIS — N186 End stage renal disease: Secondary | ICD-10-CM | POA: Diagnosis not present

## 2017-05-21 DIAGNOSIS — N2589 Other disorders resulting from impaired renal tubular function: Secondary | ICD-10-CM | POA: Diagnosis not present

## 2017-05-22 DIAGNOSIS — N186 End stage renal disease: Secondary | ICD-10-CM | POA: Diagnosis not present

## 2017-05-22 DIAGNOSIS — D509 Iron deficiency anemia, unspecified: Secondary | ICD-10-CM | POA: Diagnosis not present

## 2017-05-22 DIAGNOSIS — E119 Type 2 diabetes mellitus without complications: Secondary | ICD-10-CM | POA: Diagnosis not present

## 2017-05-22 DIAGNOSIS — E440 Moderate protein-calorie malnutrition: Secondary | ICD-10-CM | POA: Diagnosis not present

## 2017-05-22 DIAGNOSIS — Z4932 Encounter for adequacy testing for peritoneal dialysis: Secondary | ICD-10-CM | POA: Diagnosis not present

## 2017-05-22 DIAGNOSIS — N2589 Other disorders resulting from impaired renal tubular function: Secondary | ICD-10-CM | POA: Diagnosis not present

## 2017-05-22 DIAGNOSIS — N2581 Secondary hyperparathyroidism of renal origin: Secondary | ICD-10-CM | POA: Diagnosis not present

## 2017-05-22 DIAGNOSIS — R8299 Other abnormal findings in urine: Secondary | ICD-10-CM | POA: Diagnosis not present

## 2017-05-22 DIAGNOSIS — E784 Other hyperlipidemia: Secondary | ICD-10-CM | POA: Diagnosis not present

## 2017-05-23 DIAGNOSIS — R8299 Other abnormal findings in urine: Secondary | ICD-10-CM | POA: Diagnosis not present

## 2017-05-23 DIAGNOSIS — E119 Type 2 diabetes mellitus without complications: Secondary | ICD-10-CM | POA: Diagnosis not present

## 2017-05-23 DIAGNOSIS — D509 Iron deficiency anemia, unspecified: Secondary | ICD-10-CM | POA: Diagnosis not present

## 2017-05-23 DIAGNOSIS — Z4932 Encounter for adequacy testing for peritoneal dialysis: Secondary | ICD-10-CM | POA: Diagnosis not present

## 2017-05-23 DIAGNOSIS — N2581 Secondary hyperparathyroidism of renal origin: Secondary | ICD-10-CM | POA: Diagnosis not present

## 2017-05-23 DIAGNOSIS — N186 End stage renal disease: Secondary | ICD-10-CM | POA: Diagnosis not present

## 2017-05-23 DIAGNOSIS — E440 Moderate protein-calorie malnutrition: Secondary | ICD-10-CM | POA: Diagnosis not present

## 2017-05-23 DIAGNOSIS — E784 Other hyperlipidemia: Secondary | ICD-10-CM | POA: Diagnosis not present

## 2017-05-23 DIAGNOSIS — N2589 Other disorders resulting from impaired renal tubular function: Secondary | ICD-10-CM | POA: Diagnosis not present

## 2017-05-24 DIAGNOSIS — R8299 Other abnormal findings in urine: Secondary | ICD-10-CM | POA: Diagnosis not present

## 2017-05-24 DIAGNOSIS — N2581 Secondary hyperparathyroidism of renal origin: Secondary | ICD-10-CM | POA: Diagnosis not present

## 2017-05-24 DIAGNOSIS — D509 Iron deficiency anemia, unspecified: Secondary | ICD-10-CM | POA: Diagnosis not present

## 2017-05-24 DIAGNOSIS — Z4932 Encounter for adequacy testing for peritoneal dialysis: Secondary | ICD-10-CM | POA: Diagnosis not present

## 2017-05-24 DIAGNOSIS — E784 Other hyperlipidemia: Secondary | ICD-10-CM | POA: Diagnosis not present

## 2017-05-24 DIAGNOSIS — N2589 Other disorders resulting from impaired renal tubular function: Secondary | ICD-10-CM | POA: Diagnosis not present

## 2017-05-24 DIAGNOSIS — E440 Moderate protein-calorie malnutrition: Secondary | ICD-10-CM | POA: Diagnosis not present

## 2017-05-24 DIAGNOSIS — N186 End stage renal disease: Secondary | ICD-10-CM | POA: Diagnosis not present

## 2017-05-24 DIAGNOSIS — E119 Type 2 diabetes mellitus without complications: Secondary | ICD-10-CM | POA: Diagnosis not present

## 2017-05-25 DIAGNOSIS — E784 Other hyperlipidemia: Secondary | ICD-10-CM | POA: Diagnosis not present

## 2017-05-25 DIAGNOSIS — R8299 Other abnormal findings in urine: Secondary | ICD-10-CM | POA: Diagnosis not present

## 2017-05-25 DIAGNOSIS — E440 Moderate protein-calorie malnutrition: Secondary | ICD-10-CM | POA: Diagnosis not present

## 2017-05-25 DIAGNOSIS — Z4932 Encounter for adequacy testing for peritoneal dialysis: Secondary | ICD-10-CM | POA: Diagnosis not present

## 2017-05-25 DIAGNOSIS — N186 End stage renal disease: Secondary | ICD-10-CM | POA: Diagnosis not present

## 2017-05-25 DIAGNOSIS — D509 Iron deficiency anemia, unspecified: Secondary | ICD-10-CM | POA: Diagnosis not present

## 2017-05-25 DIAGNOSIS — E119 Type 2 diabetes mellitus without complications: Secondary | ICD-10-CM | POA: Diagnosis not present

## 2017-05-25 DIAGNOSIS — N2589 Other disorders resulting from impaired renal tubular function: Secondary | ICD-10-CM | POA: Diagnosis not present

## 2017-05-25 DIAGNOSIS — N2581 Secondary hyperparathyroidism of renal origin: Secondary | ICD-10-CM | POA: Diagnosis not present

## 2017-05-26 DIAGNOSIS — E440 Moderate protein-calorie malnutrition: Secondary | ICD-10-CM | POA: Diagnosis not present

## 2017-05-26 DIAGNOSIS — R8299 Other abnormal findings in urine: Secondary | ICD-10-CM | POA: Diagnosis not present

## 2017-05-26 DIAGNOSIS — Z4932 Encounter for adequacy testing for peritoneal dialysis: Secondary | ICD-10-CM | POA: Diagnosis not present

## 2017-05-26 DIAGNOSIS — N186 End stage renal disease: Secondary | ICD-10-CM | POA: Diagnosis not present

## 2017-05-26 DIAGNOSIS — N2581 Secondary hyperparathyroidism of renal origin: Secondary | ICD-10-CM | POA: Diagnosis not present

## 2017-05-26 DIAGNOSIS — E784 Other hyperlipidemia: Secondary | ICD-10-CM | POA: Diagnosis not present

## 2017-05-26 DIAGNOSIS — D509 Iron deficiency anemia, unspecified: Secondary | ICD-10-CM | POA: Diagnosis not present

## 2017-05-26 DIAGNOSIS — N2589 Other disorders resulting from impaired renal tubular function: Secondary | ICD-10-CM | POA: Diagnosis not present

## 2017-05-26 DIAGNOSIS — E119 Type 2 diabetes mellitus without complications: Secondary | ICD-10-CM | POA: Diagnosis not present

## 2017-05-27 DIAGNOSIS — E119 Type 2 diabetes mellitus without complications: Secondary | ICD-10-CM | POA: Diagnosis not present

## 2017-05-27 DIAGNOSIS — Z4932 Encounter for adequacy testing for peritoneal dialysis: Secondary | ICD-10-CM | POA: Diagnosis not present

## 2017-05-27 DIAGNOSIS — E440 Moderate protein-calorie malnutrition: Secondary | ICD-10-CM | POA: Diagnosis not present

## 2017-05-27 DIAGNOSIS — E784 Other hyperlipidemia: Secondary | ICD-10-CM | POA: Diagnosis not present

## 2017-05-27 DIAGNOSIS — R8299 Other abnormal findings in urine: Secondary | ICD-10-CM | POA: Diagnosis not present

## 2017-05-27 DIAGNOSIS — N2581 Secondary hyperparathyroidism of renal origin: Secondary | ICD-10-CM | POA: Diagnosis not present

## 2017-05-27 DIAGNOSIS — D509 Iron deficiency anemia, unspecified: Secondary | ICD-10-CM | POA: Diagnosis not present

## 2017-05-27 DIAGNOSIS — N186 End stage renal disease: Secondary | ICD-10-CM | POA: Diagnosis not present

## 2017-05-27 DIAGNOSIS — N2589 Other disorders resulting from impaired renal tubular function: Secondary | ICD-10-CM | POA: Diagnosis not present

## 2017-05-28 DIAGNOSIS — D509 Iron deficiency anemia, unspecified: Secondary | ICD-10-CM | POA: Diagnosis not present

## 2017-05-28 DIAGNOSIS — H04123 Dry eye syndrome of bilateral lacrimal glands: Secondary | ICD-10-CM | POA: Diagnosis not present

## 2017-05-28 DIAGNOSIS — E784 Other hyperlipidemia: Secondary | ICD-10-CM | POA: Diagnosis not present

## 2017-05-28 DIAGNOSIS — H40023 Open angle with borderline findings, high risk, bilateral: Secondary | ICD-10-CM | POA: Diagnosis not present

## 2017-05-28 DIAGNOSIS — R8299 Other abnormal findings in urine: Secondary | ICD-10-CM | POA: Diagnosis not present

## 2017-05-28 DIAGNOSIS — N186 End stage renal disease: Secondary | ICD-10-CM | POA: Diagnosis not present

## 2017-05-28 DIAGNOSIS — E119 Type 2 diabetes mellitus without complications: Secondary | ICD-10-CM | POA: Diagnosis not present

## 2017-05-28 DIAGNOSIS — H04213 Epiphora due to excess lacrimation, bilateral lacrimal glands: Secondary | ICD-10-CM | POA: Diagnosis not present

## 2017-05-28 DIAGNOSIS — Z4932 Encounter for adequacy testing for peritoneal dialysis: Secondary | ICD-10-CM | POA: Diagnosis not present

## 2017-05-28 DIAGNOSIS — N2589 Other disorders resulting from impaired renal tubular function: Secondary | ICD-10-CM | POA: Diagnosis not present

## 2017-05-28 DIAGNOSIS — H01003 Unspecified blepharitis right eye, unspecified eyelid: Secondary | ICD-10-CM | POA: Diagnosis not present

## 2017-05-28 DIAGNOSIS — N2581 Secondary hyperparathyroidism of renal origin: Secondary | ICD-10-CM | POA: Diagnosis not present

## 2017-05-28 DIAGNOSIS — E440 Moderate protein-calorie malnutrition: Secondary | ICD-10-CM | POA: Diagnosis not present

## 2017-05-29 DIAGNOSIS — Z4932 Encounter for adequacy testing for peritoneal dialysis: Secondary | ICD-10-CM | POA: Diagnosis not present

## 2017-05-29 DIAGNOSIS — N186 End stage renal disease: Secondary | ICD-10-CM | POA: Diagnosis not present

## 2017-05-29 DIAGNOSIS — D509 Iron deficiency anemia, unspecified: Secondary | ICD-10-CM | POA: Diagnosis not present

## 2017-05-29 DIAGNOSIS — E440 Moderate protein-calorie malnutrition: Secondary | ICD-10-CM | POA: Diagnosis not present

## 2017-05-29 DIAGNOSIS — R8299 Other abnormal findings in urine: Secondary | ICD-10-CM | POA: Diagnosis not present

## 2017-05-29 DIAGNOSIS — E119 Type 2 diabetes mellitus without complications: Secondary | ICD-10-CM | POA: Diagnosis not present

## 2017-05-29 DIAGNOSIS — E784 Other hyperlipidemia: Secondary | ICD-10-CM | POA: Diagnosis not present

## 2017-05-29 DIAGNOSIS — N2581 Secondary hyperparathyroidism of renal origin: Secondary | ICD-10-CM | POA: Diagnosis not present

## 2017-05-29 DIAGNOSIS — N2589 Other disorders resulting from impaired renal tubular function: Secondary | ICD-10-CM | POA: Diagnosis not present

## 2017-05-30 DIAGNOSIS — Z4932 Encounter for adequacy testing for peritoneal dialysis: Secondary | ICD-10-CM | POA: Diagnosis not present

## 2017-05-30 DIAGNOSIS — N2581 Secondary hyperparathyroidism of renal origin: Secondary | ICD-10-CM | POA: Diagnosis not present

## 2017-05-30 DIAGNOSIS — E440 Moderate protein-calorie malnutrition: Secondary | ICD-10-CM | POA: Diagnosis not present

## 2017-05-30 DIAGNOSIS — D509 Iron deficiency anemia, unspecified: Secondary | ICD-10-CM | POA: Diagnosis not present

## 2017-05-30 DIAGNOSIS — N2589 Other disorders resulting from impaired renal tubular function: Secondary | ICD-10-CM | POA: Diagnosis not present

## 2017-05-30 DIAGNOSIS — N186 End stage renal disease: Secondary | ICD-10-CM | POA: Diagnosis not present

## 2017-05-30 DIAGNOSIS — E119 Type 2 diabetes mellitus without complications: Secondary | ICD-10-CM | POA: Diagnosis not present

## 2017-05-30 DIAGNOSIS — R8299 Other abnormal findings in urine: Secondary | ICD-10-CM | POA: Diagnosis not present

## 2017-05-30 DIAGNOSIS — E784 Other hyperlipidemia: Secondary | ICD-10-CM | POA: Diagnosis not present

## 2017-05-31 DIAGNOSIS — E440 Moderate protein-calorie malnutrition: Secondary | ICD-10-CM | POA: Diagnosis not present

## 2017-05-31 DIAGNOSIS — R8299 Other abnormal findings in urine: Secondary | ICD-10-CM | POA: Diagnosis not present

## 2017-05-31 DIAGNOSIS — N2581 Secondary hyperparathyroidism of renal origin: Secondary | ICD-10-CM | POA: Diagnosis not present

## 2017-05-31 DIAGNOSIS — N2589 Other disorders resulting from impaired renal tubular function: Secondary | ICD-10-CM | POA: Diagnosis not present

## 2017-05-31 DIAGNOSIS — Z4932 Encounter for adequacy testing for peritoneal dialysis: Secondary | ICD-10-CM | POA: Diagnosis not present

## 2017-05-31 DIAGNOSIS — N186 End stage renal disease: Secondary | ICD-10-CM | POA: Diagnosis not present

## 2017-05-31 DIAGNOSIS — E784 Other hyperlipidemia: Secondary | ICD-10-CM | POA: Diagnosis not present

## 2017-05-31 DIAGNOSIS — E119 Type 2 diabetes mellitus without complications: Secondary | ICD-10-CM | POA: Diagnosis not present

## 2017-05-31 DIAGNOSIS — D509 Iron deficiency anemia, unspecified: Secondary | ICD-10-CM | POA: Diagnosis not present

## 2017-06-01 DIAGNOSIS — N2581 Secondary hyperparathyroidism of renal origin: Secondary | ICD-10-CM | POA: Diagnosis not present

## 2017-06-01 DIAGNOSIS — D509 Iron deficiency anemia, unspecified: Secondary | ICD-10-CM | POA: Diagnosis not present

## 2017-06-01 DIAGNOSIS — E119 Type 2 diabetes mellitus without complications: Secondary | ICD-10-CM | POA: Diagnosis not present

## 2017-06-01 DIAGNOSIS — N2589 Other disorders resulting from impaired renal tubular function: Secondary | ICD-10-CM | POA: Diagnosis not present

## 2017-06-01 DIAGNOSIS — R8299 Other abnormal findings in urine: Secondary | ICD-10-CM | POA: Diagnosis not present

## 2017-06-01 DIAGNOSIS — E440 Moderate protein-calorie malnutrition: Secondary | ICD-10-CM | POA: Diagnosis not present

## 2017-06-01 DIAGNOSIS — N186 End stage renal disease: Secondary | ICD-10-CM | POA: Diagnosis not present

## 2017-06-01 DIAGNOSIS — Z4932 Encounter for adequacy testing for peritoneal dialysis: Secondary | ICD-10-CM | POA: Diagnosis not present

## 2017-06-01 DIAGNOSIS — E784 Other hyperlipidemia: Secondary | ICD-10-CM | POA: Diagnosis not present

## 2017-06-02 DIAGNOSIS — E440 Moderate protein-calorie malnutrition: Secondary | ICD-10-CM | POA: Diagnosis not present

## 2017-06-02 DIAGNOSIS — N186 End stage renal disease: Secondary | ICD-10-CM | POA: Diagnosis not present

## 2017-06-02 DIAGNOSIS — E784 Other hyperlipidemia: Secondary | ICD-10-CM | POA: Diagnosis not present

## 2017-06-02 DIAGNOSIS — E119 Type 2 diabetes mellitus without complications: Secondary | ICD-10-CM | POA: Diagnosis not present

## 2017-06-02 DIAGNOSIS — D509 Iron deficiency anemia, unspecified: Secondary | ICD-10-CM | POA: Diagnosis not present

## 2017-06-02 DIAGNOSIS — Z4932 Encounter for adequacy testing for peritoneal dialysis: Secondary | ICD-10-CM | POA: Diagnosis not present

## 2017-06-02 DIAGNOSIS — N2581 Secondary hyperparathyroidism of renal origin: Secondary | ICD-10-CM | POA: Diagnosis not present

## 2017-06-02 DIAGNOSIS — N2589 Other disorders resulting from impaired renal tubular function: Secondary | ICD-10-CM | POA: Diagnosis not present

## 2017-06-02 DIAGNOSIS — R8299 Other abnormal findings in urine: Secondary | ICD-10-CM | POA: Diagnosis not present

## 2017-06-03 DIAGNOSIS — N186 End stage renal disease: Secondary | ICD-10-CM | POA: Diagnosis not present

## 2017-06-03 DIAGNOSIS — Z4932 Encounter for adequacy testing for peritoneal dialysis: Secondary | ICD-10-CM | POA: Diagnosis not present

## 2017-06-03 DIAGNOSIS — N2589 Other disorders resulting from impaired renal tubular function: Secondary | ICD-10-CM | POA: Diagnosis not present

## 2017-06-03 DIAGNOSIS — E119 Type 2 diabetes mellitus without complications: Secondary | ICD-10-CM | POA: Diagnosis not present

## 2017-06-03 DIAGNOSIS — E440 Moderate protein-calorie malnutrition: Secondary | ICD-10-CM | POA: Diagnosis not present

## 2017-06-03 DIAGNOSIS — R8299 Other abnormal findings in urine: Secondary | ICD-10-CM | POA: Diagnosis not present

## 2017-06-03 DIAGNOSIS — D509 Iron deficiency anemia, unspecified: Secondary | ICD-10-CM | POA: Diagnosis not present

## 2017-06-03 DIAGNOSIS — N2581 Secondary hyperparathyroidism of renal origin: Secondary | ICD-10-CM | POA: Diagnosis not present

## 2017-06-03 DIAGNOSIS — E784 Other hyperlipidemia: Secondary | ICD-10-CM | POA: Diagnosis not present

## 2017-06-04 DIAGNOSIS — E119 Type 2 diabetes mellitus without complications: Secondary | ICD-10-CM | POA: Diagnosis not present

## 2017-06-04 DIAGNOSIS — E784 Other hyperlipidemia: Secondary | ICD-10-CM | POA: Diagnosis not present

## 2017-06-04 DIAGNOSIS — Z4932 Encounter for adequacy testing for peritoneal dialysis: Secondary | ICD-10-CM | POA: Diagnosis not present

## 2017-06-04 DIAGNOSIS — N2581 Secondary hyperparathyroidism of renal origin: Secondary | ICD-10-CM | POA: Diagnosis not present

## 2017-06-04 DIAGNOSIS — R8299 Other abnormal findings in urine: Secondary | ICD-10-CM | POA: Diagnosis not present

## 2017-06-04 DIAGNOSIS — N186 End stage renal disease: Secondary | ICD-10-CM | POA: Diagnosis not present

## 2017-06-04 DIAGNOSIS — D509 Iron deficiency anemia, unspecified: Secondary | ICD-10-CM | POA: Diagnosis not present

## 2017-06-04 DIAGNOSIS — N2589 Other disorders resulting from impaired renal tubular function: Secondary | ICD-10-CM | POA: Diagnosis not present

## 2017-06-04 DIAGNOSIS — E440 Moderate protein-calorie malnutrition: Secondary | ICD-10-CM | POA: Diagnosis not present

## 2017-06-05 DIAGNOSIS — E440 Moderate protein-calorie malnutrition: Secondary | ICD-10-CM | POA: Diagnosis not present

## 2017-06-05 DIAGNOSIS — N186 End stage renal disease: Secondary | ICD-10-CM | POA: Diagnosis not present

## 2017-06-05 DIAGNOSIS — D509 Iron deficiency anemia, unspecified: Secondary | ICD-10-CM | POA: Diagnosis not present

## 2017-06-05 DIAGNOSIS — N2581 Secondary hyperparathyroidism of renal origin: Secondary | ICD-10-CM | POA: Diagnosis not present

## 2017-06-05 DIAGNOSIS — Z4932 Encounter for adequacy testing for peritoneal dialysis: Secondary | ICD-10-CM | POA: Diagnosis not present

## 2017-06-05 DIAGNOSIS — E784 Other hyperlipidemia: Secondary | ICD-10-CM | POA: Diagnosis not present

## 2017-06-05 DIAGNOSIS — N2589 Other disorders resulting from impaired renal tubular function: Secondary | ICD-10-CM | POA: Diagnosis not present

## 2017-06-05 DIAGNOSIS — R8299 Other abnormal findings in urine: Secondary | ICD-10-CM | POA: Diagnosis not present

## 2017-06-05 DIAGNOSIS — E119 Type 2 diabetes mellitus without complications: Secondary | ICD-10-CM | POA: Diagnosis not present

## 2017-06-06 DIAGNOSIS — Z4932 Encounter for adequacy testing for peritoneal dialysis: Secondary | ICD-10-CM | POA: Diagnosis not present

## 2017-06-06 DIAGNOSIS — N186 End stage renal disease: Secondary | ICD-10-CM | POA: Diagnosis not present

## 2017-06-06 DIAGNOSIS — R8299 Other abnormal findings in urine: Secondary | ICD-10-CM | POA: Diagnosis not present

## 2017-06-06 DIAGNOSIS — E440 Moderate protein-calorie malnutrition: Secondary | ICD-10-CM | POA: Diagnosis not present

## 2017-06-06 DIAGNOSIS — E119 Type 2 diabetes mellitus without complications: Secondary | ICD-10-CM | POA: Diagnosis not present

## 2017-06-06 DIAGNOSIS — D509 Iron deficiency anemia, unspecified: Secondary | ICD-10-CM | POA: Diagnosis not present

## 2017-06-06 DIAGNOSIS — E784 Other hyperlipidemia: Secondary | ICD-10-CM | POA: Diagnosis not present

## 2017-06-06 DIAGNOSIS — N2589 Other disorders resulting from impaired renal tubular function: Secondary | ICD-10-CM | POA: Diagnosis not present

## 2017-06-06 DIAGNOSIS — N2581 Secondary hyperparathyroidism of renal origin: Secondary | ICD-10-CM | POA: Diagnosis not present

## 2017-06-07 DIAGNOSIS — N2589 Other disorders resulting from impaired renal tubular function: Secondary | ICD-10-CM | POA: Diagnosis not present

## 2017-06-07 DIAGNOSIS — D509 Iron deficiency anemia, unspecified: Secondary | ICD-10-CM | POA: Diagnosis not present

## 2017-06-07 DIAGNOSIS — E440 Moderate protein-calorie malnutrition: Secondary | ICD-10-CM | POA: Diagnosis not present

## 2017-06-07 DIAGNOSIS — N186 End stage renal disease: Secondary | ICD-10-CM | POA: Diagnosis not present

## 2017-06-07 DIAGNOSIS — R8299 Other abnormal findings in urine: Secondary | ICD-10-CM | POA: Diagnosis not present

## 2017-06-07 DIAGNOSIS — E119 Type 2 diabetes mellitus without complications: Secondary | ICD-10-CM | POA: Diagnosis not present

## 2017-06-07 DIAGNOSIS — Z4932 Encounter for adequacy testing for peritoneal dialysis: Secondary | ICD-10-CM | POA: Diagnosis not present

## 2017-06-07 DIAGNOSIS — E784 Other hyperlipidemia: Secondary | ICD-10-CM | POA: Diagnosis not present

## 2017-06-07 DIAGNOSIS — N2581 Secondary hyperparathyroidism of renal origin: Secondary | ICD-10-CM | POA: Diagnosis not present

## 2017-06-08 DIAGNOSIS — Z4932 Encounter for adequacy testing for peritoneal dialysis: Secondary | ICD-10-CM | POA: Diagnosis not present

## 2017-06-08 DIAGNOSIS — E119 Type 2 diabetes mellitus without complications: Secondary | ICD-10-CM | POA: Diagnosis not present

## 2017-06-08 DIAGNOSIS — E440 Moderate protein-calorie malnutrition: Secondary | ICD-10-CM | POA: Diagnosis not present

## 2017-06-08 DIAGNOSIS — D509 Iron deficiency anemia, unspecified: Secondary | ICD-10-CM | POA: Diagnosis not present

## 2017-06-08 DIAGNOSIS — N186 End stage renal disease: Secondary | ICD-10-CM | POA: Diagnosis not present

## 2017-06-08 DIAGNOSIS — N2589 Other disorders resulting from impaired renal tubular function: Secondary | ICD-10-CM | POA: Diagnosis not present

## 2017-06-08 DIAGNOSIS — E784 Other hyperlipidemia: Secondary | ICD-10-CM | POA: Diagnosis not present

## 2017-06-08 DIAGNOSIS — N2581 Secondary hyperparathyroidism of renal origin: Secondary | ICD-10-CM | POA: Diagnosis not present

## 2017-06-08 DIAGNOSIS — R8299 Other abnormal findings in urine: Secondary | ICD-10-CM | POA: Diagnosis not present

## 2017-06-09 DIAGNOSIS — E784 Other hyperlipidemia: Secondary | ICD-10-CM | POA: Diagnosis not present

## 2017-06-09 DIAGNOSIS — N2581 Secondary hyperparathyroidism of renal origin: Secondary | ICD-10-CM | POA: Diagnosis not present

## 2017-06-09 DIAGNOSIS — E119 Type 2 diabetes mellitus without complications: Secondary | ICD-10-CM | POA: Diagnosis not present

## 2017-06-09 DIAGNOSIS — E440 Moderate protein-calorie malnutrition: Secondary | ICD-10-CM | POA: Diagnosis not present

## 2017-06-09 DIAGNOSIS — D509 Iron deficiency anemia, unspecified: Secondary | ICD-10-CM | POA: Diagnosis not present

## 2017-06-09 DIAGNOSIS — Z4932 Encounter for adequacy testing for peritoneal dialysis: Secondary | ICD-10-CM | POA: Diagnosis not present

## 2017-06-09 DIAGNOSIS — N186 End stage renal disease: Secondary | ICD-10-CM | POA: Diagnosis not present

## 2017-06-09 DIAGNOSIS — N2589 Other disorders resulting from impaired renal tubular function: Secondary | ICD-10-CM | POA: Diagnosis not present

## 2017-06-09 DIAGNOSIS — R8299 Other abnormal findings in urine: Secondary | ICD-10-CM | POA: Diagnosis not present

## 2017-06-10 DIAGNOSIS — R8299 Other abnormal findings in urine: Secondary | ICD-10-CM | POA: Diagnosis not present

## 2017-06-10 DIAGNOSIS — N186 End stage renal disease: Secondary | ICD-10-CM | POA: Diagnosis not present

## 2017-06-10 DIAGNOSIS — D509 Iron deficiency anemia, unspecified: Secondary | ICD-10-CM | POA: Diagnosis not present

## 2017-06-10 DIAGNOSIS — E784 Other hyperlipidemia: Secondary | ICD-10-CM | POA: Diagnosis not present

## 2017-06-10 DIAGNOSIS — N2581 Secondary hyperparathyroidism of renal origin: Secondary | ICD-10-CM | POA: Diagnosis not present

## 2017-06-10 DIAGNOSIS — E119 Type 2 diabetes mellitus without complications: Secondary | ICD-10-CM | POA: Diagnosis not present

## 2017-06-10 DIAGNOSIS — N2589 Other disorders resulting from impaired renal tubular function: Secondary | ICD-10-CM | POA: Diagnosis not present

## 2017-06-10 DIAGNOSIS — E440 Moderate protein-calorie malnutrition: Secondary | ICD-10-CM | POA: Diagnosis not present

## 2017-06-10 DIAGNOSIS — Z4932 Encounter for adequacy testing for peritoneal dialysis: Secondary | ICD-10-CM | POA: Diagnosis not present

## 2017-06-11 DIAGNOSIS — E440 Moderate protein-calorie malnutrition: Secondary | ICD-10-CM | POA: Diagnosis not present

## 2017-06-11 DIAGNOSIS — R8299 Other abnormal findings in urine: Secondary | ICD-10-CM | POA: Diagnosis not present

## 2017-06-11 DIAGNOSIS — E784 Other hyperlipidemia: Secondary | ICD-10-CM | POA: Diagnosis not present

## 2017-06-11 DIAGNOSIS — N186 End stage renal disease: Secondary | ICD-10-CM | POA: Diagnosis not present

## 2017-06-11 DIAGNOSIS — N2589 Other disorders resulting from impaired renal tubular function: Secondary | ICD-10-CM | POA: Diagnosis not present

## 2017-06-11 DIAGNOSIS — E119 Type 2 diabetes mellitus without complications: Secondary | ICD-10-CM | POA: Diagnosis not present

## 2017-06-11 DIAGNOSIS — N2581 Secondary hyperparathyroidism of renal origin: Secondary | ICD-10-CM | POA: Diagnosis not present

## 2017-06-11 DIAGNOSIS — D509 Iron deficiency anemia, unspecified: Secondary | ICD-10-CM | POA: Diagnosis not present

## 2017-06-11 DIAGNOSIS — Z4932 Encounter for adequacy testing for peritoneal dialysis: Secondary | ICD-10-CM | POA: Diagnosis not present

## 2017-06-12 DIAGNOSIS — E784 Other hyperlipidemia: Secondary | ICD-10-CM | POA: Diagnosis not present

## 2017-06-12 DIAGNOSIS — Z4932 Encounter for adequacy testing for peritoneal dialysis: Secondary | ICD-10-CM | POA: Diagnosis not present

## 2017-06-12 DIAGNOSIS — R8299 Other abnormal findings in urine: Secondary | ICD-10-CM | POA: Diagnosis not present

## 2017-06-12 DIAGNOSIS — E119 Type 2 diabetes mellitus without complications: Secondary | ICD-10-CM | POA: Diagnosis not present

## 2017-06-12 DIAGNOSIS — D509 Iron deficiency anemia, unspecified: Secondary | ICD-10-CM | POA: Diagnosis not present

## 2017-06-12 DIAGNOSIS — N186 End stage renal disease: Secondary | ICD-10-CM | POA: Diagnosis not present

## 2017-06-12 DIAGNOSIS — E440 Moderate protein-calorie malnutrition: Secondary | ICD-10-CM | POA: Diagnosis not present

## 2017-06-12 DIAGNOSIS — N2589 Other disorders resulting from impaired renal tubular function: Secondary | ICD-10-CM | POA: Diagnosis not present

## 2017-06-12 DIAGNOSIS — N2581 Secondary hyperparathyroidism of renal origin: Secondary | ICD-10-CM | POA: Diagnosis not present

## 2017-06-13 DIAGNOSIS — E784 Other hyperlipidemia: Secondary | ICD-10-CM | POA: Diagnosis not present

## 2017-06-13 DIAGNOSIS — Z4932 Encounter for adequacy testing for peritoneal dialysis: Secondary | ICD-10-CM | POA: Diagnosis not present

## 2017-06-13 DIAGNOSIS — N2589 Other disorders resulting from impaired renal tubular function: Secondary | ICD-10-CM | POA: Diagnosis not present

## 2017-06-13 DIAGNOSIS — N186 End stage renal disease: Secondary | ICD-10-CM | POA: Diagnosis not present

## 2017-06-13 DIAGNOSIS — D509 Iron deficiency anemia, unspecified: Secondary | ICD-10-CM | POA: Diagnosis not present

## 2017-06-13 DIAGNOSIS — E119 Type 2 diabetes mellitus without complications: Secondary | ICD-10-CM | POA: Diagnosis not present

## 2017-06-13 DIAGNOSIS — N2581 Secondary hyperparathyroidism of renal origin: Secondary | ICD-10-CM | POA: Diagnosis not present

## 2017-06-13 DIAGNOSIS — E440 Moderate protein-calorie malnutrition: Secondary | ICD-10-CM | POA: Diagnosis not present

## 2017-06-13 DIAGNOSIS — R8299 Other abnormal findings in urine: Secondary | ICD-10-CM | POA: Diagnosis not present

## 2017-06-14 DIAGNOSIS — E440 Moderate protein-calorie malnutrition: Secondary | ICD-10-CM | POA: Diagnosis not present

## 2017-06-14 DIAGNOSIS — E119 Type 2 diabetes mellitus without complications: Secondary | ICD-10-CM | POA: Diagnosis not present

## 2017-06-14 DIAGNOSIS — N2589 Other disorders resulting from impaired renal tubular function: Secondary | ICD-10-CM | POA: Diagnosis not present

## 2017-06-14 DIAGNOSIS — D509 Iron deficiency anemia, unspecified: Secondary | ICD-10-CM | POA: Diagnosis not present

## 2017-06-14 DIAGNOSIS — Z4932 Encounter for adequacy testing for peritoneal dialysis: Secondary | ICD-10-CM | POA: Diagnosis not present

## 2017-06-14 DIAGNOSIS — E784 Other hyperlipidemia: Secondary | ICD-10-CM | POA: Diagnosis not present

## 2017-06-14 DIAGNOSIS — N186 End stage renal disease: Secondary | ICD-10-CM | POA: Diagnosis not present

## 2017-06-14 DIAGNOSIS — R8299 Other abnormal findings in urine: Secondary | ICD-10-CM | POA: Diagnosis not present

## 2017-06-14 DIAGNOSIS — N2581 Secondary hyperparathyroidism of renal origin: Secondary | ICD-10-CM | POA: Diagnosis not present

## 2017-06-15 ENCOUNTER — Ambulatory Visit (INDEPENDENT_AMBULATORY_CARE_PROVIDER_SITE_OTHER): Payer: Medicare Other | Admitting: Internal Medicine

## 2017-06-15 VITALS — BP 140/82 | HR 88 | Temp 97.6°F | Resp 16 | Ht 61.0 in | Wt 158.0 lb

## 2017-06-15 DIAGNOSIS — E1122 Type 2 diabetes mellitus with diabetic chronic kidney disease: Secondary | ICD-10-CM

## 2017-06-15 DIAGNOSIS — Z79899 Other long term (current) drug therapy: Secondary | ICD-10-CM

## 2017-06-15 DIAGNOSIS — E782 Mixed hyperlipidemia: Secondary | ICD-10-CM | POA: Diagnosis not present

## 2017-06-15 DIAGNOSIS — N186 End stage renal disease: Secondary | ICD-10-CM

## 2017-06-15 DIAGNOSIS — E119 Type 2 diabetes mellitus without complications: Secondary | ICD-10-CM | POA: Diagnosis not present

## 2017-06-15 DIAGNOSIS — E440 Moderate protein-calorie malnutrition: Secondary | ICD-10-CM | POA: Diagnosis not present

## 2017-06-15 DIAGNOSIS — M1000 Idiopathic gout, unspecified site: Secondary | ICD-10-CM

## 2017-06-15 DIAGNOSIS — Z4932 Encounter for adequacy testing for peritoneal dialysis: Secondary | ICD-10-CM | POA: Diagnosis not present

## 2017-06-15 DIAGNOSIS — N2589 Other disorders resulting from impaired renal tubular function: Secondary | ICD-10-CM | POA: Diagnosis not present

## 2017-06-15 DIAGNOSIS — E559 Vitamin D deficiency, unspecified: Secondary | ICD-10-CM

## 2017-06-15 DIAGNOSIS — N2581 Secondary hyperparathyroidism of renal origin: Secondary | ICD-10-CM | POA: Diagnosis not present

## 2017-06-15 DIAGNOSIS — E784 Other hyperlipidemia: Secondary | ICD-10-CM | POA: Diagnosis not present

## 2017-06-15 DIAGNOSIS — Z992 Dependence on renal dialysis: Secondary | ICD-10-CM

## 2017-06-15 DIAGNOSIS — D509 Iron deficiency anemia, unspecified: Secondary | ICD-10-CM | POA: Diagnosis not present

## 2017-06-15 DIAGNOSIS — R8299 Other abnormal findings in urine: Secondary | ICD-10-CM | POA: Diagnosis not present

## 2017-06-15 DIAGNOSIS — I10 Essential (primary) hypertension: Secondary | ICD-10-CM

## 2017-06-15 LAB — TSH: TSH: 2.51 m[IU]/L

## 2017-06-15 NOTE — Patient Instructions (Signed)

## 2017-06-15 NOTE — Progress Notes (Signed)
This very nice 76 y.o. DBF presents for 6 month follow up with Hypertension, Hyperlipidemia, Pre-Diabetes and Vitamin D Deficiency.      Patient is treated for HTN (1990's)  & BP has been controlled at home. Today's BP os at goal - 140/82. Patient has had no complaints of any cardiac type chest pain, palpitations, dyspnea/orthopnea/PND, dizziness, claudication, or dependent edema.     Hyperlipidemia is controlled with diet & meds. Patient denies myalgias or other med SE's. Currrent Lipids are at goal: Lab Results  Component Value Date   CHOL 180 06/15/2017   HDL 58 06/15/2017   LDLCALC 93 06/15/2017   TRIG 147 06/15/2017   CHOLHDL 3.1 06/15/2017      Also, the patient has history of T2_NIDDM (A1c 6.6% in 1999) and so far has controlled with diet. She denies symptoms of reactive hypoglycemia, diabetic polys, paresthesias or visual blurring.  Current  A1c is not at goal: Lab Results  Component Value Date   HGBA1C 6.6 (H) 06/15/2017      Further, the patient also has history of Vitamin D Deficiency and supplements vitamin D without any suspected side-effects. Current  vitamin D is still very low :  Lab Results  Component Value Date   VD25OH 33 06/15/2017   Current Outpatient Prescriptions on File Prior to Visit  Medication Sig  . allopurinol (ZYLOPRIM) 300 MG tablet Take 300 mg by mouth every other day.  Marland Kitchen aspirin 81 MG tablet Take 81 mg by mouth at bedtime.   Marland Kitchen b complex-vitamin c-folic acid (NEPHRO-VITE) 0.8 MG TABS tablet Take 1 tablet by mouth at bedtime.  . calcitRIOL (ROCALTROL) 0.5 MCG capsule Take 0.5 mcg by mouth every other day.  . calcium acetate (PHOSLO) 667 MG capsule Take 667 mg by mouth 3 (three) times daily with meals.   . Cholecalciferol (VITAMIN D-3 PO) Take 5,000 Units by mouth every other day.   . cinacalcet (SENSIPAR) 30 MG tablet Take 30 mg by mouth daily.  Marland Kitchen docusate sodium (COLACE) 100 MG capsule Take 100 mg by mouth 2 (two) times daily.  . Flaxseed,  Linseed, (FLAXSEED OIL) 1000 MG CAPS Take 1 capsule by mouth daily.   Marland Kitchen glucose blood (ACCU-CHEK AVIVA PLUS) test strip Test once daily  . hydrALAZINE (APRESOLINE) 10 MG tablet TAKE 2 TABLETS BY MOUTH 2  TIMES DAILY. (Patient taking differently: Take 20 mg by mouth 2 (two) times daily. TAKE 2 TABLETS BY MOUTH 2  TIMES DAILY.)  . labetalol (NORMODYNE) 200 MG tablet TAKE 1 TABLET BY MOUTH TWO  TIMES DAILY  . Omega-3 Fatty Acids (FISH OIL) 1200 MG CAPS Take 2 capsules by mouth daily.  . sevelamer carbonate (RENVELA) 800 MG tablet Take 1,600 mg by mouth 3 (three) times daily with meals.   . zinc gluconate 50 MG tablet Take 50 mg by mouth every morning.  . [DISCONTINUED] diltiazem (CARDIZEM) 120 MG tablet Take 120 mg by mouth 2 (two) times daily.   No current facility-administered medications on file prior to visit.    Allergies  Allergen Reactions  . Ace Inhibitors     unknown  . Nsaids     unknown   PMHx:   Past Medical History:  Diagnosis Date  . Anemia   . Arthritis    Osteoarthritis  . Chronic kidney disease    Chronic renal insufficiency  . Diabetes mellitus    Pre  . Diverticulosis   . GERD (gastroesophageal reflux disease)   . Gout   .  Hiatal hernia   . History of blood transfusion   . Hyperlipidemia   . Hypertension   . Osteopenia   . Pancreatic cyst 1999  . Thyroid disease    Hyperparathyroid    Immunization History  Administered Date(s) Administered  . DT 02/05/2014  . Influenza, High Dose Seasonal PF 09/25/2013  . Influenza-Unspecified 08/11/2015  . Pneumococcal Polysaccharide-23 11/20/2009  . Td 11/21/2003  . Zoster 11/20/2005   Past Surgical History:  Procedure Laterality Date  . CHOLECYSTECTOMY    . COLONOSCOPY  03/21/12   Next one in 2018  . EYE SURGERY Left    cataract extraction with IOL  . INSERTION OF DIALYSIS CATHETER N/A 02/15/2017   Procedure: REVISION OF DIALYSIS CATHETER;  Surgeon: Algernon Huxley, MD;  Location: [facility];  Service:  Cardiovascular;  Laterality: N/A;  . JOINT REPLACEMENT  2012   left knee  . left knee replacement     . PANCREATIC CYST EXCISION  1999  . TOTAL HIP ARTHROPLASTY Right 05/26/2015   Procedure: RIGHT TOTAL HIP ARTHROPLASTY ANTERIOR APPROACH;  Surgeon: Gaynelle Arabian, MD;  Location: [facility];  Service: Orthopedics;  Laterality: Right;  . TOTAL KNEE ARTHROPLASTY Right 06/09/2013   Procedure: RIGHT TOTAL KNEE ARTHROPLASTY;  Surgeon: Gearlean Alf, MD;  Location: [facility];  Service: Orthopedics;  Laterality: Right;   FHx:    Reviewed / unchanged  SHx:    Reviewed / unchanged  Systems Review:  Constitutional: Denies fever, chills, wt changes, headaches, insomnia, fatigue, night sweats, change in appetite. Eyes: Denies redness, blurred vision, diplopia, discharge, itchy, watery eyes.  ENT: Denies discharge, congestion, post nasal drip, epistaxis, sore throat, earache, hearing loss, dental pain, tinnitus, vertigo, sinus pain, snoring.  CV: Denies chest pain, palpitations, irregular heartbeat, syncope, dyspnea, diaphoresis, orthopnea, PND, claudication or edema. Respiratory: denies cough, dyspnea, DOE, pleurisy, hoarseness, laryngitis, wheezing.  Gastrointestinal: Denies dysphagia, odynophagia, heartburn, reflux, water brash, abdominal pain or cramps, nausea, vomiting, bloating, diarrhea, constipation, hematemesis, melena, hematochezia  or hemorrhoids. Genitourinary: Denies dysuria, frequency, urgency, nocturia, hesitancy, discharge, hematuria or flank pain. Musculoskeletal: Denies arthralgias, myalgias, stiffness, jt. swelling, pain, limping or strain/sprain.  Skin: Denies pruritus, rash, hives, warts, acne, eczema or change in skin lesion(s). Neuro: No weakness, tremor, incoordination, spasms, paresthesia or pain. Psychiatric: Denies confusion, memory loss or sensory loss. Endo: Denies change in weight, skin or hair change.  Heme/Lymph: No excessive bleeding, bruising or enlarged lymph nodes.  Physical  Exam  BP 140/82   Pulse 88   Temp 97.6 F (36.4 C)   Resp 16   Ht 5\' 1"  (1.549 m)   Wt 158 lb (71.7 kg)   BMI 29.85 kg/m   Appears well nourished, well groomed  and in no distress.  Eyes: PERRLA, EOMs, conjunctiva no swelling or erythema. Sinuses: No frontal/maxillary tenderness ENT/Mouth: EAC's clear, TM's nl w/o erythema, bulging. Nares clear w/o erythema, swelling, exudates. Oropharynx clear without erythema or exudates. Oral hygiene is good. Tongue normal, non obstructing. Hearing intact.  Neck: Supple. Thyroid nl. Car 2+/2+ without bruits, nodes or JVD. Chest: Respirations nl with BS clear & equal w/o rales, rhonchi, wheezing or stridor.  Cor: Heart sounds normal w/ regular rate and rhythm without sig. murmurs, gallops, clicks or rubs. Peripheral pulses normal and equal  without edema.  Abdomen: Soft & bowel sounds normal. Non-tender w/o guarding, rebound, hernias, masses or organomegaly.  Lymphatics: Unremarkable.  Musculoskeletal: Full ROM all peripheral extremities, joint stability, 5/5 strength and normal gait.  Skin: Warm, dry without  exposed rashes, lesions or ecchymosis apparent.  Neuro: Cranial nerves intact, reflexes equal bilaterally. Sensory-motor testing grossly intact. Tendon reflexes grossly intact.  Pysch: Alert & oriented x 3.  Insight and judgement nl & appropriate. No ideations.  Assessment and Plan:   1. Essential hypertension  - Continue medication, monitor blood pressure at home.  - Continue DASH diet. Reminder to go to the ER if any CP,  SOB, nausea, dizziness, severe HA, changes vision/speech.  - BASIC METABOLIC PANEL WITH GFR - Magnesium - TSH  2. Hyperlipidemia, mixed  - Continue diet/meds, exercise,& lifestyle modifications.  - Continue monitor periodic cholesterol/liver & renal functions   - Hepatic function panel - Lipid panel - TSH  3. 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  Vitamin D deficiency  - Continue diet, exercise, lifestyle modifications.  - Monitor appropriate labs. - Continue supplementation.  - VITAMIN D 25 Hydroxy    5. Idiopathic gout  - Uric acid  6. Medication management  - BASIC METABOLIC PANEL WITH GFR - Hepatic function panel - Magnesium - Lipid panel - TSH - Hemoglobin A1c - Insulin, random - VITAMIN D 25 Hydroxy        Discussed  regular exercise, BP monitoring, weight control to achieve/maintain BMI less than 25 and discussed med and SE's. Recommended labs to assess and monitor clinical status with further disposition pending results of labs. Over 30 minutes of exam, counseling, chart review was performed.

## 2017-06-16 ENCOUNTER — Encounter: Payer: Self-pay | Admitting: Internal Medicine

## 2017-06-16 DIAGNOSIS — N2581 Secondary hyperparathyroidism of renal origin: Secondary | ICD-10-CM | POA: Diagnosis not present

## 2017-06-16 DIAGNOSIS — N2589 Other disorders resulting from impaired renal tubular function: Secondary | ICD-10-CM | POA: Diagnosis not present

## 2017-06-16 DIAGNOSIS — E119 Type 2 diabetes mellitus without complications: Secondary | ICD-10-CM | POA: Diagnosis not present

## 2017-06-16 DIAGNOSIS — D509 Iron deficiency anemia, unspecified: Secondary | ICD-10-CM | POA: Diagnosis not present

## 2017-06-16 DIAGNOSIS — E440 Moderate protein-calorie malnutrition: Secondary | ICD-10-CM | POA: Diagnosis not present

## 2017-06-16 DIAGNOSIS — Z4932 Encounter for adequacy testing for peritoneal dialysis: Secondary | ICD-10-CM | POA: Diagnosis not present

## 2017-06-16 DIAGNOSIS — R8299 Other abnormal findings in urine: Secondary | ICD-10-CM | POA: Diagnosis not present

## 2017-06-16 DIAGNOSIS — N186 End stage renal disease: Secondary | ICD-10-CM | POA: Diagnosis not present

## 2017-06-16 DIAGNOSIS — E784 Other hyperlipidemia: Secondary | ICD-10-CM | POA: Diagnosis not present

## 2017-06-16 LAB — BASIC METABOLIC PANEL WITH GFR
BUN: 85 mg/dL — AB (ref 7–25)
CO2: 22 mmol/L (ref 20–31)
CREATININE: 9.19 mg/dL — AB (ref 0.60–0.93)
Calcium: 9 mg/dL (ref 8.6–10.4)
Chloride: 98 mmol/L (ref 98–110)
GFR, Est Non African American: 4 mL/min — ABNORMAL LOW (ref >=60)
Glucose, Bld: 123 mg/dL — ABNORMAL HIGH (ref 65–99)
Potassium: 4.2 mmol/L (ref 3.5–5.3)
Sodium: 139 mmol/L (ref 135–146)

## 2017-06-16 LAB — HEPATIC FUNCTION PANEL
ALBUMIN: 3.4 g/dL — AB (ref 3.6–5.1)
ALT: 18 U/L (ref 6–29)
AST: 21 U/L (ref 10–35)
Alkaline Phosphatase: 167 U/L — ABNORMAL HIGH (ref 33–130)
BILIRUBIN TOTAL: 0.4 mg/dL (ref 0.2–1.2)
Bilirubin, Direct: 0.1 mg/dL (ref <=0.2)
Indirect Bilirubin: 0.3 mg/dL (ref 0.2–1.2)
Total Protein: 5.9 g/dL — ABNORMAL LOW (ref 6.1–8.1)

## 2017-06-16 LAB — LIPID PANEL
Cholesterol: 180 mg/dL (ref <200)
HDL: 58 mg/dL (ref >50)
LDL Cholesterol: 93 mg/dL (ref <100)
TRIGLYCERIDES: 147 mg/dL (ref <150)
Total CHOL/HDL Ratio: 3.1 Ratio (ref <5.0)
VLDL: 29 mg/dL (ref <30)

## 2017-06-16 LAB — HEMOGLOBIN A1C
Hgb A1c MFr Bld: 6.6 % — ABNORMAL HIGH (ref <5.7)
Mean Plasma Glucose: 143 mg/dL

## 2017-06-16 LAB — URIC ACID: Uric Acid, Serum: 2.8 mg/dL (ref 2.5–7.0)

## 2017-06-16 LAB — VITAMIN D 25 HYDROXY (VIT D DEFICIENCY, FRACTURES): VIT D 25 HYDROXY: 33 ng/mL (ref 30–100)

## 2017-06-16 LAB — MAGNESIUM: Magnesium: 2.1 mg/dL (ref 1.5–2.5)

## 2017-06-17 DIAGNOSIS — E440 Moderate protein-calorie malnutrition: Secondary | ICD-10-CM | POA: Diagnosis not present

## 2017-06-17 DIAGNOSIS — R8299 Other abnormal findings in urine: Secondary | ICD-10-CM | POA: Diagnosis not present

## 2017-06-17 DIAGNOSIS — Z4932 Encounter for adequacy testing for peritoneal dialysis: Secondary | ICD-10-CM | POA: Diagnosis not present

## 2017-06-17 DIAGNOSIS — D509 Iron deficiency anemia, unspecified: Secondary | ICD-10-CM | POA: Diagnosis not present

## 2017-06-17 DIAGNOSIS — E119 Type 2 diabetes mellitus without complications: Secondary | ICD-10-CM | POA: Diagnosis not present

## 2017-06-17 DIAGNOSIS — N2581 Secondary hyperparathyroidism of renal origin: Secondary | ICD-10-CM | POA: Diagnosis not present

## 2017-06-17 DIAGNOSIS — E784 Other hyperlipidemia: Secondary | ICD-10-CM | POA: Diagnosis not present

## 2017-06-17 DIAGNOSIS — N186 End stage renal disease: Secondary | ICD-10-CM | POA: Diagnosis not present

## 2017-06-17 DIAGNOSIS — N2589 Other disorders resulting from impaired renal tubular function: Secondary | ICD-10-CM | POA: Diagnosis not present

## 2017-06-18 DIAGNOSIS — Z4932 Encounter for adequacy testing for peritoneal dialysis: Secondary | ICD-10-CM | POA: Diagnosis not present

## 2017-06-18 DIAGNOSIS — R8299 Other abnormal findings in urine: Secondary | ICD-10-CM | POA: Diagnosis not present

## 2017-06-18 DIAGNOSIS — E440 Moderate protein-calorie malnutrition: Secondary | ICD-10-CM | POA: Diagnosis not present

## 2017-06-18 DIAGNOSIS — E119 Type 2 diabetes mellitus without complications: Secondary | ICD-10-CM | POA: Diagnosis not present

## 2017-06-18 DIAGNOSIS — N186 End stage renal disease: Secondary | ICD-10-CM | POA: Diagnosis not present

## 2017-06-18 DIAGNOSIS — N2581 Secondary hyperparathyroidism of renal origin: Secondary | ICD-10-CM | POA: Diagnosis not present

## 2017-06-18 DIAGNOSIS — D509 Iron deficiency anemia, unspecified: Secondary | ICD-10-CM | POA: Diagnosis not present

## 2017-06-18 DIAGNOSIS — E784 Other hyperlipidemia: Secondary | ICD-10-CM | POA: Diagnosis not present

## 2017-06-18 DIAGNOSIS — N2589 Other disorders resulting from impaired renal tubular function: Secondary | ICD-10-CM | POA: Diagnosis not present

## 2017-06-18 LAB — INSULIN, RANDOM: INSULIN: 7.3 u[IU]/mL (ref 2.0–19.6)

## 2017-06-19 DIAGNOSIS — R8299 Other abnormal findings in urine: Secondary | ICD-10-CM | POA: Diagnosis not present

## 2017-06-19 DIAGNOSIS — D509 Iron deficiency anemia, unspecified: Secondary | ICD-10-CM | POA: Diagnosis not present

## 2017-06-19 DIAGNOSIS — E440 Moderate protein-calorie malnutrition: Secondary | ICD-10-CM | POA: Diagnosis not present

## 2017-06-19 DIAGNOSIS — Z4932 Encounter for adequacy testing for peritoneal dialysis: Secondary | ICD-10-CM | POA: Diagnosis not present

## 2017-06-19 DIAGNOSIS — E1122 Type 2 diabetes mellitus with diabetic chronic kidney disease: Secondary | ICD-10-CM | POA: Diagnosis not present

## 2017-06-19 DIAGNOSIS — E784 Other hyperlipidemia: Secondary | ICD-10-CM | POA: Diagnosis not present

## 2017-06-19 DIAGNOSIS — N186 End stage renal disease: Secondary | ICD-10-CM | POA: Diagnosis not present

## 2017-06-19 DIAGNOSIS — Z992 Dependence on renal dialysis: Secondary | ICD-10-CM | POA: Diagnosis not present

## 2017-06-19 DIAGNOSIS — N2589 Other disorders resulting from impaired renal tubular function: Secondary | ICD-10-CM | POA: Diagnosis not present

## 2017-06-19 DIAGNOSIS — E119 Type 2 diabetes mellitus without complications: Secondary | ICD-10-CM | POA: Diagnosis not present

## 2017-06-19 DIAGNOSIS — N2581 Secondary hyperparathyroidism of renal origin: Secondary | ICD-10-CM | POA: Diagnosis not present

## 2017-06-20 DIAGNOSIS — D631 Anemia in chronic kidney disease: Secondary | ICD-10-CM | POA: Diagnosis not present

## 2017-06-20 DIAGNOSIS — Z79899 Other long term (current) drug therapy: Secondary | ICD-10-CM | POA: Diagnosis not present

## 2017-06-20 DIAGNOSIS — D509 Iron deficiency anemia, unspecified: Secondary | ICD-10-CM | POA: Diagnosis not present

## 2017-06-20 DIAGNOSIS — N2581 Secondary hyperparathyroidism of renal origin: Secondary | ICD-10-CM | POA: Diagnosis not present

## 2017-06-20 DIAGNOSIS — N186 End stage renal disease: Secondary | ICD-10-CM | POA: Diagnosis not present

## 2017-06-20 DIAGNOSIS — R17 Unspecified jaundice: Secondary | ICD-10-CM | POA: Diagnosis not present

## 2017-06-20 DIAGNOSIS — Z23 Encounter for immunization: Secondary | ICD-10-CM | POA: Diagnosis not present

## 2017-06-20 DIAGNOSIS — R8299 Other abnormal findings in urine: Secondary | ICD-10-CM | POA: Diagnosis not present

## 2017-06-20 DIAGNOSIS — E440 Moderate protein-calorie malnutrition: Secondary | ICD-10-CM | POA: Diagnosis not present

## 2017-06-21 DIAGNOSIS — E440 Moderate protein-calorie malnutrition: Secondary | ICD-10-CM | POA: Diagnosis not present

## 2017-06-21 DIAGNOSIS — D631 Anemia in chronic kidney disease: Secondary | ICD-10-CM | POA: Diagnosis not present

## 2017-06-21 DIAGNOSIS — N186 End stage renal disease: Secondary | ICD-10-CM | POA: Diagnosis not present

## 2017-06-21 DIAGNOSIS — Z79899 Other long term (current) drug therapy: Secondary | ICD-10-CM | POA: Diagnosis not present

## 2017-06-21 DIAGNOSIS — N2581 Secondary hyperparathyroidism of renal origin: Secondary | ICD-10-CM | POA: Diagnosis not present

## 2017-06-21 DIAGNOSIS — R8299 Other abnormal findings in urine: Secondary | ICD-10-CM | POA: Diagnosis not present

## 2017-06-21 DIAGNOSIS — Z23 Encounter for immunization: Secondary | ICD-10-CM | POA: Diagnosis not present

## 2017-06-21 DIAGNOSIS — R17 Unspecified jaundice: Secondary | ICD-10-CM | POA: Diagnosis not present

## 2017-06-21 DIAGNOSIS — D509 Iron deficiency anemia, unspecified: Secondary | ICD-10-CM | POA: Diagnosis not present

## 2017-06-22 DIAGNOSIS — Z23 Encounter for immunization: Secondary | ICD-10-CM | POA: Diagnosis not present

## 2017-06-22 DIAGNOSIS — N2581 Secondary hyperparathyroidism of renal origin: Secondary | ICD-10-CM | POA: Diagnosis not present

## 2017-06-22 DIAGNOSIS — R17 Unspecified jaundice: Secondary | ICD-10-CM | POA: Diagnosis not present

## 2017-06-22 DIAGNOSIS — E440 Moderate protein-calorie malnutrition: Secondary | ICD-10-CM | POA: Diagnosis not present

## 2017-06-22 DIAGNOSIS — R8299 Other abnormal findings in urine: Secondary | ICD-10-CM | POA: Diagnosis not present

## 2017-06-22 DIAGNOSIS — D631 Anemia in chronic kidney disease: Secondary | ICD-10-CM | POA: Diagnosis not present

## 2017-06-22 DIAGNOSIS — N186 End stage renal disease: Secondary | ICD-10-CM | POA: Diagnosis not present

## 2017-06-22 DIAGNOSIS — Z79899 Other long term (current) drug therapy: Secondary | ICD-10-CM | POA: Diagnosis not present

## 2017-06-22 DIAGNOSIS — D509 Iron deficiency anemia, unspecified: Secondary | ICD-10-CM | POA: Diagnosis not present

## 2017-06-23 DIAGNOSIS — D631 Anemia in chronic kidney disease: Secondary | ICD-10-CM | POA: Diagnosis not present

## 2017-06-23 DIAGNOSIS — N186 End stage renal disease: Secondary | ICD-10-CM | POA: Diagnosis not present

## 2017-06-23 DIAGNOSIS — R8299 Other abnormal findings in urine: Secondary | ICD-10-CM | POA: Diagnosis not present

## 2017-06-23 DIAGNOSIS — Z23 Encounter for immunization: Secondary | ICD-10-CM | POA: Diagnosis not present

## 2017-06-23 DIAGNOSIS — Z79899 Other long term (current) drug therapy: Secondary | ICD-10-CM | POA: Diagnosis not present

## 2017-06-23 DIAGNOSIS — E440 Moderate protein-calorie malnutrition: Secondary | ICD-10-CM | POA: Diagnosis not present

## 2017-06-23 DIAGNOSIS — R17 Unspecified jaundice: Secondary | ICD-10-CM | POA: Diagnosis not present

## 2017-06-23 DIAGNOSIS — D509 Iron deficiency anemia, unspecified: Secondary | ICD-10-CM | POA: Diagnosis not present

## 2017-06-23 DIAGNOSIS — N2581 Secondary hyperparathyroidism of renal origin: Secondary | ICD-10-CM | POA: Diagnosis not present

## 2017-06-24 DIAGNOSIS — R8299 Other abnormal findings in urine: Secondary | ICD-10-CM | POA: Diagnosis not present

## 2017-06-24 DIAGNOSIS — Z23 Encounter for immunization: Secondary | ICD-10-CM | POA: Diagnosis not present

## 2017-06-24 DIAGNOSIS — N186 End stage renal disease: Secondary | ICD-10-CM | POA: Diagnosis not present

## 2017-06-24 DIAGNOSIS — E440 Moderate protein-calorie malnutrition: Secondary | ICD-10-CM | POA: Diagnosis not present

## 2017-06-24 DIAGNOSIS — Z79899 Other long term (current) drug therapy: Secondary | ICD-10-CM | POA: Diagnosis not present

## 2017-06-24 DIAGNOSIS — N2581 Secondary hyperparathyroidism of renal origin: Secondary | ICD-10-CM | POA: Diagnosis not present

## 2017-06-24 DIAGNOSIS — D631 Anemia in chronic kidney disease: Secondary | ICD-10-CM | POA: Diagnosis not present

## 2017-06-24 DIAGNOSIS — R17 Unspecified jaundice: Secondary | ICD-10-CM | POA: Diagnosis not present

## 2017-06-24 DIAGNOSIS — D509 Iron deficiency anemia, unspecified: Secondary | ICD-10-CM | POA: Diagnosis not present

## 2017-06-25 DIAGNOSIS — N2581 Secondary hyperparathyroidism of renal origin: Secondary | ICD-10-CM | POA: Diagnosis not present

## 2017-06-25 DIAGNOSIS — R8299 Other abnormal findings in urine: Secondary | ICD-10-CM | POA: Diagnosis not present

## 2017-06-25 DIAGNOSIS — D509 Iron deficiency anemia, unspecified: Secondary | ICD-10-CM | POA: Diagnosis not present

## 2017-06-25 DIAGNOSIS — E440 Moderate protein-calorie malnutrition: Secondary | ICD-10-CM | POA: Diagnosis not present

## 2017-06-25 DIAGNOSIS — D631 Anemia in chronic kidney disease: Secondary | ICD-10-CM | POA: Diagnosis not present

## 2017-06-25 DIAGNOSIS — R17 Unspecified jaundice: Secondary | ICD-10-CM | POA: Diagnosis not present

## 2017-06-25 DIAGNOSIS — N186 End stage renal disease: Secondary | ICD-10-CM | POA: Diagnosis not present

## 2017-06-25 DIAGNOSIS — Z23 Encounter for immunization: Secondary | ICD-10-CM | POA: Diagnosis not present

## 2017-06-25 DIAGNOSIS — Z79899 Other long term (current) drug therapy: Secondary | ICD-10-CM | POA: Diagnosis not present

## 2017-06-26 DIAGNOSIS — D631 Anemia in chronic kidney disease: Secondary | ICD-10-CM | POA: Diagnosis not present

## 2017-06-26 DIAGNOSIS — E440 Moderate protein-calorie malnutrition: Secondary | ICD-10-CM | POA: Diagnosis not present

## 2017-06-26 DIAGNOSIS — D509 Iron deficiency anemia, unspecified: Secondary | ICD-10-CM | POA: Diagnosis not present

## 2017-06-26 DIAGNOSIS — N2581 Secondary hyperparathyroidism of renal origin: Secondary | ICD-10-CM | POA: Diagnosis not present

## 2017-06-26 DIAGNOSIS — R17 Unspecified jaundice: Secondary | ICD-10-CM | POA: Diagnosis not present

## 2017-06-26 DIAGNOSIS — N186 End stage renal disease: Secondary | ICD-10-CM | POA: Diagnosis not present

## 2017-06-26 DIAGNOSIS — R8299 Other abnormal findings in urine: Secondary | ICD-10-CM | POA: Diagnosis not present

## 2017-06-26 DIAGNOSIS — Z79899 Other long term (current) drug therapy: Secondary | ICD-10-CM | POA: Diagnosis not present

## 2017-06-26 DIAGNOSIS — Z23 Encounter for immunization: Secondary | ICD-10-CM | POA: Diagnosis not present

## 2017-06-27 DIAGNOSIS — Z79899 Other long term (current) drug therapy: Secondary | ICD-10-CM | POA: Diagnosis not present

## 2017-06-27 DIAGNOSIS — R8299 Other abnormal findings in urine: Secondary | ICD-10-CM | POA: Diagnosis not present

## 2017-06-27 DIAGNOSIS — D631 Anemia in chronic kidney disease: Secondary | ICD-10-CM | POA: Diagnosis not present

## 2017-06-27 DIAGNOSIS — N2581 Secondary hyperparathyroidism of renal origin: Secondary | ICD-10-CM | POA: Diagnosis not present

## 2017-06-27 DIAGNOSIS — R17 Unspecified jaundice: Secondary | ICD-10-CM | POA: Diagnosis not present

## 2017-06-27 DIAGNOSIS — D509 Iron deficiency anemia, unspecified: Secondary | ICD-10-CM | POA: Diagnosis not present

## 2017-06-27 DIAGNOSIS — E440 Moderate protein-calorie malnutrition: Secondary | ICD-10-CM | POA: Diagnosis not present

## 2017-06-27 DIAGNOSIS — Z23 Encounter for immunization: Secondary | ICD-10-CM | POA: Diagnosis not present

## 2017-06-27 DIAGNOSIS — N186 End stage renal disease: Secondary | ICD-10-CM | POA: Diagnosis not present

## 2017-06-28 DIAGNOSIS — E440 Moderate protein-calorie malnutrition: Secondary | ICD-10-CM | POA: Diagnosis not present

## 2017-06-28 DIAGNOSIS — Z23 Encounter for immunization: Secondary | ICD-10-CM | POA: Diagnosis not present

## 2017-06-28 DIAGNOSIS — R8299 Other abnormal findings in urine: Secondary | ICD-10-CM | POA: Diagnosis not present

## 2017-06-28 DIAGNOSIS — N186 End stage renal disease: Secondary | ICD-10-CM | POA: Diagnosis not present

## 2017-06-28 DIAGNOSIS — R17 Unspecified jaundice: Secondary | ICD-10-CM | POA: Diagnosis not present

## 2017-06-28 DIAGNOSIS — N2581 Secondary hyperparathyroidism of renal origin: Secondary | ICD-10-CM | POA: Diagnosis not present

## 2017-06-28 DIAGNOSIS — D631 Anemia in chronic kidney disease: Secondary | ICD-10-CM | POA: Diagnosis not present

## 2017-06-28 DIAGNOSIS — Z79899 Other long term (current) drug therapy: Secondary | ICD-10-CM | POA: Diagnosis not present

## 2017-06-28 DIAGNOSIS — D509 Iron deficiency anemia, unspecified: Secondary | ICD-10-CM | POA: Diagnosis not present

## 2017-06-29 DIAGNOSIS — N186 End stage renal disease: Secondary | ICD-10-CM | POA: Diagnosis not present

## 2017-06-29 DIAGNOSIS — D509 Iron deficiency anemia, unspecified: Secondary | ICD-10-CM | POA: Diagnosis not present

## 2017-06-29 DIAGNOSIS — R8299 Other abnormal findings in urine: Secondary | ICD-10-CM | POA: Diagnosis not present

## 2017-06-29 DIAGNOSIS — D631 Anemia in chronic kidney disease: Secondary | ICD-10-CM | POA: Diagnosis not present

## 2017-06-29 DIAGNOSIS — Z79899 Other long term (current) drug therapy: Secondary | ICD-10-CM | POA: Diagnosis not present

## 2017-06-29 DIAGNOSIS — E440 Moderate protein-calorie malnutrition: Secondary | ICD-10-CM | POA: Diagnosis not present

## 2017-06-29 DIAGNOSIS — R17 Unspecified jaundice: Secondary | ICD-10-CM | POA: Diagnosis not present

## 2017-06-29 DIAGNOSIS — Z23 Encounter for immunization: Secondary | ICD-10-CM | POA: Diagnosis not present

## 2017-06-29 DIAGNOSIS — N2581 Secondary hyperparathyroidism of renal origin: Secondary | ICD-10-CM | POA: Diagnosis not present

## 2017-06-30 DIAGNOSIS — Z79899 Other long term (current) drug therapy: Secondary | ICD-10-CM | POA: Diagnosis not present

## 2017-06-30 DIAGNOSIS — Z23 Encounter for immunization: Secondary | ICD-10-CM | POA: Diagnosis not present

## 2017-06-30 DIAGNOSIS — N2581 Secondary hyperparathyroidism of renal origin: Secondary | ICD-10-CM | POA: Diagnosis not present

## 2017-06-30 DIAGNOSIS — R17 Unspecified jaundice: Secondary | ICD-10-CM | POA: Diagnosis not present

## 2017-06-30 DIAGNOSIS — D631 Anemia in chronic kidney disease: Secondary | ICD-10-CM | POA: Diagnosis not present

## 2017-06-30 DIAGNOSIS — E440 Moderate protein-calorie malnutrition: Secondary | ICD-10-CM | POA: Diagnosis not present

## 2017-06-30 DIAGNOSIS — D509 Iron deficiency anemia, unspecified: Secondary | ICD-10-CM | POA: Diagnosis not present

## 2017-06-30 DIAGNOSIS — R8299 Other abnormal findings in urine: Secondary | ICD-10-CM | POA: Diagnosis not present

## 2017-06-30 DIAGNOSIS — N186 End stage renal disease: Secondary | ICD-10-CM | POA: Diagnosis not present

## 2017-07-01 DIAGNOSIS — Z79899 Other long term (current) drug therapy: Secondary | ICD-10-CM | POA: Diagnosis not present

## 2017-07-01 DIAGNOSIS — D631 Anemia in chronic kidney disease: Secondary | ICD-10-CM | POA: Diagnosis not present

## 2017-07-01 DIAGNOSIS — Z23 Encounter for immunization: Secondary | ICD-10-CM | POA: Diagnosis not present

## 2017-07-01 DIAGNOSIS — R8299 Other abnormal findings in urine: Secondary | ICD-10-CM | POA: Diagnosis not present

## 2017-07-01 DIAGNOSIS — N186 End stage renal disease: Secondary | ICD-10-CM | POA: Diagnosis not present

## 2017-07-01 DIAGNOSIS — R17 Unspecified jaundice: Secondary | ICD-10-CM | POA: Diagnosis not present

## 2017-07-01 DIAGNOSIS — N2581 Secondary hyperparathyroidism of renal origin: Secondary | ICD-10-CM | POA: Diagnosis not present

## 2017-07-01 DIAGNOSIS — D509 Iron deficiency anemia, unspecified: Secondary | ICD-10-CM | POA: Diagnosis not present

## 2017-07-01 DIAGNOSIS — E440 Moderate protein-calorie malnutrition: Secondary | ICD-10-CM | POA: Diagnosis not present

## 2017-07-02 DIAGNOSIS — Z23 Encounter for immunization: Secondary | ICD-10-CM | POA: Diagnosis not present

## 2017-07-02 DIAGNOSIS — R8299 Other abnormal findings in urine: Secondary | ICD-10-CM | POA: Diagnosis not present

## 2017-07-02 DIAGNOSIS — E440 Moderate protein-calorie malnutrition: Secondary | ICD-10-CM | POA: Diagnosis not present

## 2017-07-02 DIAGNOSIS — D509 Iron deficiency anemia, unspecified: Secondary | ICD-10-CM | POA: Diagnosis not present

## 2017-07-02 DIAGNOSIS — R17 Unspecified jaundice: Secondary | ICD-10-CM | POA: Diagnosis not present

## 2017-07-02 DIAGNOSIS — D631 Anemia in chronic kidney disease: Secondary | ICD-10-CM | POA: Diagnosis not present

## 2017-07-02 DIAGNOSIS — Z79899 Other long term (current) drug therapy: Secondary | ICD-10-CM | POA: Diagnosis not present

## 2017-07-02 DIAGNOSIS — N2581 Secondary hyperparathyroidism of renal origin: Secondary | ICD-10-CM | POA: Diagnosis not present

## 2017-07-02 DIAGNOSIS — N186 End stage renal disease: Secondary | ICD-10-CM | POA: Diagnosis not present

## 2017-07-03 ENCOUNTER — Encounter (INDEPENDENT_AMBULATORY_CARE_PROVIDER_SITE_OTHER): Payer: Medicare Other | Admitting: Ophthalmology

## 2017-07-03 DIAGNOSIS — R17 Unspecified jaundice: Secondary | ICD-10-CM | POA: Diagnosis not present

## 2017-07-03 DIAGNOSIS — H35033 Hypertensive retinopathy, bilateral: Secondary | ICD-10-CM

## 2017-07-03 DIAGNOSIS — Z23 Encounter for immunization: Secondary | ICD-10-CM | POA: Diagnosis not present

## 2017-07-03 DIAGNOSIS — E113292 Type 2 diabetes mellitus with mild nonproliferative diabetic retinopathy without macular edema, left eye: Secondary | ICD-10-CM

## 2017-07-03 DIAGNOSIS — N186 End stage renal disease: Secondary | ICD-10-CM | POA: Diagnosis not present

## 2017-07-03 DIAGNOSIS — D631 Anemia in chronic kidney disease: Secondary | ICD-10-CM | POA: Diagnosis not present

## 2017-07-03 DIAGNOSIS — E440 Moderate protein-calorie malnutrition: Secondary | ICD-10-CM | POA: Diagnosis not present

## 2017-07-03 DIAGNOSIS — I10 Essential (primary) hypertension: Secondary | ICD-10-CM | POA: Diagnosis not present

## 2017-07-03 DIAGNOSIS — N2581 Secondary hyperparathyroidism of renal origin: Secondary | ICD-10-CM | POA: Diagnosis not present

## 2017-07-03 DIAGNOSIS — Z79899 Other long term (current) drug therapy: Secondary | ICD-10-CM | POA: Diagnosis not present

## 2017-07-03 DIAGNOSIS — E11311 Type 2 diabetes mellitus with unspecified diabetic retinopathy with macular edema: Secondary | ICD-10-CM

## 2017-07-03 DIAGNOSIS — D509 Iron deficiency anemia, unspecified: Secondary | ICD-10-CM | POA: Diagnosis not present

## 2017-07-03 DIAGNOSIS — R8299 Other abnormal findings in urine: Secondary | ICD-10-CM | POA: Diagnosis not present

## 2017-07-03 DIAGNOSIS — E113211 Type 2 diabetes mellitus with mild nonproliferative diabetic retinopathy with macular edema, right eye: Secondary | ICD-10-CM | POA: Diagnosis not present

## 2017-07-03 DIAGNOSIS — H43813 Vitreous degeneration, bilateral: Secondary | ICD-10-CM

## 2017-07-04 DIAGNOSIS — Z79899 Other long term (current) drug therapy: Secondary | ICD-10-CM | POA: Diagnosis not present

## 2017-07-04 DIAGNOSIS — D631 Anemia in chronic kidney disease: Secondary | ICD-10-CM | POA: Diagnosis not present

## 2017-07-04 DIAGNOSIS — Z23 Encounter for immunization: Secondary | ICD-10-CM | POA: Diagnosis not present

## 2017-07-04 DIAGNOSIS — N2581 Secondary hyperparathyroidism of renal origin: Secondary | ICD-10-CM | POA: Diagnosis not present

## 2017-07-04 DIAGNOSIS — D509 Iron deficiency anemia, unspecified: Secondary | ICD-10-CM | POA: Diagnosis not present

## 2017-07-04 DIAGNOSIS — R17 Unspecified jaundice: Secondary | ICD-10-CM | POA: Diagnosis not present

## 2017-07-04 DIAGNOSIS — E440 Moderate protein-calorie malnutrition: Secondary | ICD-10-CM | POA: Diagnosis not present

## 2017-07-04 DIAGNOSIS — R8299 Other abnormal findings in urine: Secondary | ICD-10-CM | POA: Diagnosis not present

## 2017-07-04 DIAGNOSIS — N186 End stage renal disease: Secondary | ICD-10-CM | POA: Diagnosis not present

## 2017-07-05 DIAGNOSIS — E440 Moderate protein-calorie malnutrition: Secondary | ICD-10-CM | POA: Diagnosis not present

## 2017-07-05 DIAGNOSIS — Z23 Encounter for immunization: Secondary | ICD-10-CM | POA: Diagnosis not present

## 2017-07-05 DIAGNOSIS — R8299 Other abnormal findings in urine: Secondary | ICD-10-CM | POA: Diagnosis not present

## 2017-07-05 DIAGNOSIS — N2581 Secondary hyperparathyroidism of renal origin: Secondary | ICD-10-CM | POA: Diagnosis not present

## 2017-07-05 DIAGNOSIS — Z79899 Other long term (current) drug therapy: Secondary | ICD-10-CM | POA: Diagnosis not present

## 2017-07-05 DIAGNOSIS — R17 Unspecified jaundice: Secondary | ICD-10-CM | POA: Diagnosis not present

## 2017-07-05 DIAGNOSIS — D509 Iron deficiency anemia, unspecified: Secondary | ICD-10-CM | POA: Diagnosis not present

## 2017-07-05 DIAGNOSIS — D631 Anemia in chronic kidney disease: Secondary | ICD-10-CM | POA: Diagnosis not present

## 2017-07-05 DIAGNOSIS — N186 End stage renal disease: Secondary | ICD-10-CM | POA: Diagnosis not present

## 2017-07-06 DIAGNOSIS — E440 Moderate protein-calorie malnutrition: Secondary | ICD-10-CM | POA: Diagnosis not present

## 2017-07-06 DIAGNOSIS — D509 Iron deficiency anemia, unspecified: Secondary | ICD-10-CM | POA: Diagnosis not present

## 2017-07-06 DIAGNOSIS — N2581 Secondary hyperparathyroidism of renal origin: Secondary | ICD-10-CM | POA: Diagnosis not present

## 2017-07-06 DIAGNOSIS — R8299 Other abnormal findings in urine: Secondary | ICD-10-CM | POA: Diagnosis not present

## 2017-07-06 DIAGNOSIS — N186 End stage renal disease: Secondary | ICD-10-CM | POA: Diagnosis not present

## 2017-07-06 DIAGNOSIS — Z79899 Other long term (current) drug therapy: Secondary | ICD-10-CM | POA: Diagnosis not present

## 2017-07-06 DIAGNOSIS — Z23 Encounter for immunization: Secondary | ICD-10-CM | POA: Diagnosis not present

## 2017-07-06 DIAGNOSIS — R17 Unspecified jaundice: Secondary | ICD-10-CM | POA: Diagnosis not present

## 2017-07-06 DIAGNOSIS — D631 Anemia in chronic kidney disease: Secondary | ICD-10-CM | POA: Diagnosis not present

## 2017-07-07 DIAGNOSIS — D631 Anemia in chronic kidney disease: Secondary | ICD-10-CM | POA: Diagnosis not present

## 2017-07-07 DIAGNOSIS — E440 Moderate protein-calorie malnutrition: Secondary | ICD-10-CM | POA: Diagnosis not present

## 2017-07-07 DIAGNOSIS — Z79899 Other long term (current) drug therapy: Secondary | ICD-10-CM | POA: Diagnosis not present

## 2017-07-07 DIAGNOSIS — D509 Iron deficiency anemia, unspecified: Secondary | ICD-10-CM | POA: Diagnosis not present

## 2017-07-07 DIAGNOSIS — R17 Unspecified jaundice: Secondary | ICD-10-CM | POA: Diagnosis not present

## 2017-07-07 DIAGNOSIS — Z23 Encounter for immunization: Secondary | ICD-10-CM | POA: Diagnosis not present

## 2017-07-07 DIAGNOSIS — N186 End stage renal disease: Secondary | ICD-10-CM | POA: Diagnosis not present

## 2017-07-07 DIAGNOSIS — R8299 Other abnormal findings in urine: Secondary | ICD-10-CM | POA: Diagnosis not present

## 2017-07-07 DIAGNOSIS — N2581 Secondary hyperparathyroidism of renal origin: Secondary | ICD-10-CM | POA: Diagnosis not present

## 2017-07-08 DIAGNOSIS — E440 Moderate protein-calorie malnutrition: Secondary | ICD-10-CM | POA: Diagnosis not present

## 2017-07-08 DIAGNOSIS — Z23 Encounter for immunization: Secondary | ICD-10-CM | POA: Diagnosis not present

## 2017-07-08 DIAGNOSIS — D509 Iron deficiency anemia, unspecified: Secondary | ICD-10-CM | POA: Diagnosis not present

## 2017-07-08 DIAGNOSIS — N2581 Secondary hyperparathyroidism of renal origin: Secondary | ICD-10-CM | POA: Diagnosis not present

## 2017-07-08 DIAGNOSIS — Z79899 Other long term (current) drug therapy: Secondary | ICD-10-CM | POA: Diagnosis not present

## 2017-07-08 DIAGNOSIS — D631 Anemia in chronic kidney disease: Secondary | ICD-10-CM | POA: Diagnosis not present

## 2017-07-08 DIAGNOSIS — R8299 Other abnormal findings in urine: Secondary | ICD-10-CM | POA: Diagnosis not present

## 2017-07-08 DIAGNOSIS — R17 Unspecified jaundice: Secondary | ICD-10-CM | POA: Diagnosis not present

## 2017-07-08 DIAGNOSIS — N186 End stage renal disease: Secondary | ICD-10-CM | POA: Diagnosis not present

## 2017-07-09 DIAGNOSIS — Z79899 Other long term (current) drug therapy: Secondary | ICD-10-CM | POA: Diagnosis not present

## 2017-07-09 DIAGNOSIS — Z23 Encounter for immunization: Secondary | ICD-10-CM | POA: Diagnosis not present

## 2017-07-09 DIAGNOSIS — R8299 Other abnormal findings in urine: Secondary | ICD-10-CM | POA: Diagnosis not present

## 2017-07-09 DIAGNOSIS — N186 End stage renal disease: Secondary | ICD-10-CM | POA: Diagnosis not present

## 2017-07-09 DIAGNOSIS — R17 Unspecified jaundice: Secondary | ICD-10-CM | POA: Diagnosis not present

## 2017-07-09 DIAGNOSIS — E440 Moderate protein-calorie malnutrition: Secondary | ICD-10-CM | POA: Diagnosis not present

## 2017-07-09 DIAGNOSIS — D509 Iron deficiency anemia, unspecified: Secondary | ICD-10-CM | POA: Diagnosis not present

## 2017-07-09 DIAGNOSIS — D631 Anemia in chronic kidney disease: Secondary | ICD-10-CM | POA: Diagnosis not present

## 2017-07-09 DIAGNOSIS — N2581 Secondary hyperparathyroidism of renal origin: Secondary | ICD-10-CM | POA: Diagnosis not present

## 2017-07-10 DIAGNOSIS — R8299 Other abnormal findings in urine: Secondary | ICD-10-CM | POA: Diagnosis not present

## 2017-07-10 DIAGNOSIS — R17 Unspecified jaundice: Secondary | ICD-10-CM | POA: Diagnosis not present

## 2017-07-10 DIAGNOSIS — E440 Moderate protein-calorie malnutrition: Secondary | ICD-10-CM | POA: Diagnosis not present

## 2017-07-10 DIAGNOSIS — N2581 Secondary hyperparathyroidism of renal origin: Secondary | ICD-10-CM | POA: Diagnosis not present

## 2017-07-10 DIAGNOSIS — D631 Anemia in chronic kidney disease: Secondary | ICD-10-CM | POA: Diagnosis not present

## 2017-07-10 DIAGNOSIS — D509 Iron deficiency anemia, unspecified: Secondary | ICD-10-CM | POA: Diagnosis not present

## 2017-07-10 DIAGNOSIS — N186 End stage renal disease: Secondary | ICD-10-CM | POA: Diagnosis not present

## 2017-07-10 DIAGNOSIS — Z79899 Other long term (current) drug therapy: Secondary | ICD-10-CM | POA: Diagnosis not present

## 2017-07-10 DIAGNOSIS — Z23 Encounter for immunization: Secondary | ICD-10-CM | POA: Diagnosis not present

## 2017-07-11 DIAGNOSIS — R8299 Other abnormal findings in urine: Secondary | ICD-10-CM | POA: Diagnosis not present

## 2017-07-11 DIAGNOSIS — R17 Unspecified jaundice: Secondary | ICD-10-CM | POA: Diagnosis not present

## 2017-07-11 DIAGNOSIS — N2581 Secondary hyperparathyroidism of renal origin: Secondary | ICD-10-CM | POA: Diagnosis not present

## 2017-07-11 DIAGNOSIS — D509 Iron deficiency anemia, unspecified: Secondary | ICD-10-CM | POA: Diagnosis not present

## 2017-07-11 DIAGNOSIS — D631 Anemia in chronic kidney disease: Secondary | ICD-10-CM | POA: Diagnosis not present

## 2017-07-11 DIAGNOSIS — E440 Moderate protein-calorie malnutrition: Secondary | ICD-10-CM | POA: Diagnosis not present

## 2017-07-11 DIAGNOSIS — Z79899 Other long term (current) drug therapy: Secondary | ICD-10-CM | POA: Diagnosis not present

## 2017-07-11 DIAGNOSIS — Z23 Encounter for immunization: Secondary | ICD-10-CM | POA: Diagnosis not present

## 2017-07-11 DIAGNOSIS — N186 End stage renal disease: Secondary | ICD-10-CM | POA: Diagnosis not present

## 2017-07-12 DIAGNOSIS — E440 Moderate protein-calorie malnutrition: Secondary | ICD-10-CM | POA: Diagnosis not present

## 2017-07-12 DIAGNOSIS — D509 Iron deficiency anemia, unspecified: Secondary | ICD-10-CM | POA: Diagnosis not present

## 2017-07-12 DIAGNOSIS — Z23 Encounter for immunization: Secondary | ICD-10-CM | POA: Diagnosis not present

## 2017-07-12 DIAGNOSIS — N2581 Secondary hyperparathyroidism of renal origin: Secondary | ICD-10-CM | POA: Diagnosis not present

## 2017-07-12 DIAGNOSIS — N186 End stage renal disease: Secondary | ICD-10-CM | POA: Diagnosis not present

## 2017-07-12 DIAGNOSIS — R17 Unspecified jaundice: Secondary | ICD-10-CM | POA: Diagnosis not present

## 2017-07-12 DIAGNOSIS — Z79899 Other long term (current) drug therapy: Secondary | ICD-10-CM | POA: Diagnosis not present

## 2017-07-12 DIAGNOSIS — R8299 Other abnormal findings in urine: Secondary | ICD-10-CM | POA: Diagnosis not present

## 2017-07-12 DIAGNOSIS — D631 Anemia in chronic kidney disease: Secondary | ICD-10-CM | POA: Diagnosis not present

## 2017-07-13 DIAGNOSIS — D631 Anemia in chronic kidney disease: Secondary | ICD-10-CM | POA: Diagnosis not present

## 2017-07-13 DIAGNOSIS — R8299 Other abnormal findings in urine: Secondary | ICD-10-CM | POA: Diagnosis not present

## 2017-07-13 DIAGNOSIS — R17 Unspecified jaundice: Secondary | ICD-10-CM | POA: Diagnosis not present

## 2017-07-13 DIAGNOSIS — N2581 Secondary hyperparathyroidism of renal origin: Secondary | ICD-10-CM | POA: Diagnosis not present

## 2017-07-13 DIAGNOSIS — D509 Iron deficiency anemia, unspecified: Secondary | ICD-10-CM | POA: Diagnosis not present

## 2017-07-13 DIAGNOSIS — Z79899 Other long term (current) drug therapy: Secondary | ICD-10-CM | POA: Diagnosis not present

## 2017-07-13 DIAGNOSIS — N186 End stage renal disease: Secondary | ICD-10-CM | POA: Diagnosis not present

## 2017-07-13 DIAGNOSIS — E440 Moderate protein-calorie malnutrition: Secondary | ICD-10-CM | POA: Diagnosis not present

## 2017-07-13 DIAGNOSIS — Z23 Encounter for immunization: Secondary | ICD-10-CM | POA: Diagnosis not present

## 2017-07-14 DIAGNOSIS — Z79899 Other long term (current) drug therapy: Secondary | ICD-10-CM | POA: Diagnosis not present

## 2017-07-14 DIAGNOSIS — N186 End stage renal disease: Secondary | ICD-10-CM | POA: Diagnosis not present

## 2017-07-14 DIAGNOSIS — N2581 Secondary hyperparathyroidism of renal origin: Secondary | ICD-10-CM | POA: Diagnosis not present

## 2017-07-14 DIAGNOSIS — D631 Anemia in chronic kidney disease: Secondary | ICD-10-CM | POA: Diagnosis not present

## 2017-07-14 DIAGNOSIS — R8299 Other abnormal findings in urine: Secondary | ICD-10-CM | POA: Diagnosis not present

## 2017-07-14 DIAGNOSIS — D509 Iron deficiency anemia, unspecified: Secondary | ICD-10-CM | POA: Diagnosis not present

## 2017-07-14 DIAGNOSIS — E440 Moderate protein-calorie malnutrition: Secondary | ICD-10-CM | POA: Diagnosis not present

## 2017-07-14 DIAGNOSIS — Z23 Encounter for immunization: Secondary | ICD-10-CM | POA: Diagnosis not present

## 2017-07-14 DIAGNOSIS — R17 Unspecified jaundice: Secondary | ICD-10-CM | POA: Diagnosis not present

## 2017-07-15 DIAGNOSIS — E440 Moderate protein-calorie malnutrition: Secondary | ICD-10-CM | POA: Diagnosis not present

## 2017-07-15 DIAGNOSIS — N2581 Secondary hyperparathyroidism of renal origin: Secondary | ICD-10-CM | POA: Diagnosis not present

## 2017-07-15 DIAGNOSIS — R8299 Other abnormal findings in urine: Secondary | ICD-10-CM | POA: Diagnosis not present

## 2017-07-15 DIAGNOSIS — D509 Iron deficiency anemia, unspecified: Secondary | ICD-10-CM | POA: Diagnosis not present

## 2017-07-15 DIAGNOSIS — Z79899 Other long term (current) drug therapy: Secondary | ICD-10-CM | POA: Diagnosis not present

## 2017-07-15 DIAGNOSIS — Z23 Encounter for immunization: Secondary | ICD-10-CM | POA: Diagnosis not present

## 2017-07-15 DIAGNOSIS — R17 Unspecified jaundice: Secondary | ICD-10-CM | POA: Diagnosis not present

## 2017-07-15 DIAGNOSIS — D631 Anemia in chronic kidney disease: Secondary | ICD-10-CM | POA: Diagnosis not present

## 2017-07-15 DIAGNOSIS — N186 End stage renal disease: Secondary | ICD-10-CM | POA: Diagnosis not present

## 2017-07-16 DIAGNOSIS — R17 Unspecified jaundice: Secondary | ICD-10-CM | POA: Diagnosis not present

## 2017-07-16 DIAGNOSIS — D509 Iron deficiency anemia, unspecified: Secondary | ICD-10-CM | POA: Diagnosis not present

## 2017-07-16 DIAGNOSIS — R8299 Other abnormal findings in urine: Secondary | ICD-10-CM | POA: Diagnosis not present

## 2017-07-16 DIAGNOSIS — N2581 Secondary hyperparathyroidism of renal origin: Secondary | ICD-10-CM | POA: Diagnosis not present

## 2017-07-16 DIAGNOSIS — E440 Moderate protein-calorie malnutrition: Secondary | ICD-10-CM | POA: Diagnosis not present

## 2017-07-16 DIAGNOSIS — Z23 Encounter for immunization: Secondary | ICD-10-CM | POA: Diagnosis not present

## 2017-07-16 DIAGNOSIS — N186 End stage renal disease: Secondary | ICD-10-CM | POA: Diagnosis not present

## 2017-07-16 DIAGNOSIS — Z79899 Other long term (current) drug therapy: Secondary | ICD-10-CM | POA: Diagnosis not present

## 2017-07-16 DIAGNOSIS — D631 Anemia in chronic kidney disease: Secondary | ICD-10-CM | POA: Diagnosis not present

## 2017-07-17 DIAGNOSIS — Z79899 Other long term (current) drug therapy: Secondary | ICD-10-CM | POA: Diagnosis not present

## 2017-07-17 DIAGNOSIS — R17 Unspecified jaundice: Secondary | ICD-10-CM | POA: Diagnosis not present

## 2017-07-17 DIAGNOSIS — R8299 Other abnormal findings in urine: Secondary | ICD-10-CM | POA: Diagnosis not present

## 2017-07-17 DIAGNOSIS — D631 Anemia in chronic kidney disease: Secondary | ICD-10-CM | POA: Diagnosis not present

## 2017-07-17 DIAGNOSIS — E440 Moderate protein-calorie malnutrition: Secondary | ICD-10-CM | POA: Diagnosis not present

## 2017-07-17 DIAGNOSIS — D509 Iron deficiency anemia, unspecified: Secondary | ICD-10-CM | POA: Diagnosis not present

## 2017-07-17 DIAGNOSIS — N186 End stage renal disease: Secondary | ICD-10-CM | POA: Diagnosis not present

## 2017-07-17 DIAGNOSIS — N2581 Secondary hyperparathyroidism of renal origin: Secondary | ICD-10-CM | POA: Diagnosis not present

## 2017-07-17 DIAGNOSIS — Z23 Encounter for immunization: Secondary | ICD-10-CM | POA: Diagnosis not present

## 2017-07-18 DIAGNOSIS — R17 Unspecified jaundice: Secondary | ICD-10-CM | POA: Diagnosis not present

## 2017-07-18 DIAGNOSIS — Z79899 Other long term (current) drug therapy: Secondary | ICD-10-CM | POA: Diagnosis not present

## 2017-07-18 DIAGNOSIS — N186 End stage renal disease: Secondary | ICD-10-CM | POA: Diagnosis not present

## 2017-07-18 DIAGNOSIS — R8299 Other abnormal findings in urine: Secondary | ICD-10-CM | POA: Diagnosis not present

## 2017-07-18 DIAGNOSIS — D509 Iron deficiency anemia, unspecified: Secondary | ICD-10-CM | POA: Diagnosis not present

## 2017-07-18 DIAGNOSIS — N2581 Secondary hyperparathyroidism of renal origin: Secondary | ICD-10-CM | POA: Diagnosis not present

## 2017-07-18 DIAGNOSIS — Z23 Encounter for immunization: Secondary | ICD-10-CM | POA: Diagnosis not present

## 2017-07-18 DIAGNOSIS — E440 Moderate protein-calorie malnutrition: Secondary | ICD-10-CM | POA: Diagnosis not present

## 2017-07-18 DIAGNOSIS — D631 Anemia in chronic kidney disease: Secondary | ICD-10-CM | POA: Diagnosis not present

## 2017-07-19 DIAGNOSIS — D509 Iron deficiency anemia, unspecified: Secondary | ICD-10-CM | POA: Diagnosis not present

## 2017-07-19 DIAGNOSIS — N186 End stage renal disease: Secondary | ICD-10-CM | POA: Diagnosis not present

## 2017-07-19 DIAGNOSIS — R8299 Other abnormal findings in urine: Secondary | ICD-10-CM | POA: Diagnosis not present

## 2017-07-19 DIAGNOSIS — D631 Anemia in chronic kidney disease: Secondary | ICD-10-CM | POA: Diagnosis not present

## 2017-07-19 DIAGNOSIS — R17 Unspecified jaundice: Secondary | ICD-10-CM | POA: Diagnosis not present

## 2017-07-19 DIAGNOSIS — N2581 Secondary hyperparathyroidism of renal origin: Secondary | ICD-10-CM | POA: Diagnosis not present

## 2017-07-19 DIAGNOSIS — Z79899 Other long term (current) drug therapy: Secondary | ICD-10-CM | POA: Diagnosis not present

## 2017-07-19 DIAGNOSIS — E440 Moderate protein-calorie malnutrition: Secondary | ICD-10-CM | POA: Diagnosis not present

## 2017-07-19 DIAGNOSIS — Z23 Encounter for immunization: Secondary | ICD-10-CM | POA: Diagnosis not present

## 2017-07-20 DIAGNOSIS — N2581 Secondary hyperparathyroidism of renal origin: Secondary | ICD-10-CM | POA: Diagnosis not present

## 2017-07-20 DIAGNOSIS — D509 Iron deficiency anemia, unspecified: Secondary | ICD-10-CM | POA: Diagnosis not present

## 2017-07-20 DIAGNOSIS — Z23 Encounter for immunization: Secondary | ICD-10-CM | POA: Diagnosis not present

## 2017-07-20 DIAGNOSIS — R17 Unspecified jaundice: Secondary | ICD-10-CM | POA: Diagnosis not present

## 2017-07-20 DIAGNOSIS — Z992 Dependence on renal dialysis: Secondary | ICD-10-CM | POA: Diagnosis not present

## 2017-07-20 DIAGNOSIS — D631 Anemia in chronic kidney disease: Secondary | ICD-10-CM | POA: Diagnosis not present

## 2017-07-20 DIAGNOSIS — Z79899 Other long term (current) drug therapy: Secondary | ICD-10-CM | POA: Diagnosis not present

## 2017-07-20 DIAGNOSIS — N186 End stage renal disease: Secondary | ICD-10-CM | POA: Diagnosis not present

## 2017-07-20 DIAGNOSIS — E1122 Type 2 diabetes mellitus with diabetic chronic kidney disease: Secondary | ICD-10-CM | POA: Diagnosis not present

## 2017-07-20 DIAGNOSIS — E440 Moderate protein-calorie malnutrition: Secondary | ICD-10-CM | POA: Diagnosis not present

## 2017-07-20 DIAGNOSIS — R8299 Other abnormal findings in urine: Secondary | ICD-10-CM | POA: Diagnosis not present

## 2017-07-21 DIAGNOSIS — R17 Unspecified jaundice: Secondary | ICD-10-CM | POA: Diagnosis not present

## 2017-07-21 DIAGNOSIS — N2581 Secondary hyperparathyroidism of renal origin: Secondary | ICD-10-CM | POA: Diagnosis not present

## 2017-07-21 DIAGNOSIS — E440 Moderate protein-calorie malnutrition: Secondary | ICD-10-CM | POA: Diagnosis not present

## 2017-07-21 DIAGNOSIS — R8299 Other abnormal findings in urine: Secondary | ICD-10-CM | POA: Diagnosis not present

## 2017-07-21 DIAGNOSIS — D509 Iron deficiency anemia, unspecified: Secondary | ICD-10-CM | POA: Diagnosis not present

## 2017-07-21 DIAGNOSIS — N186 End stage renal disease: Secondary | ICD-10-CM | POA: Diagnosis not present

## 2017-07-21 DIAGNOSIS — D631 Anemia in chronic kidney disease: Secondary | ICD-10-CM | POA: Diagnosis not present

## 2017-07-21 DIAGNOSIS — Z79899 Other long term (current) drug therapy: Secondary | ICD-10-CM | POA: Diagnosis not present

## 2017-07-22 DIAGNOSIS — N2581 Secondary hyperparathyroidism of renal origin: Secondary | ICD-10-CM | POA: Diagnosis not present

## 2017-07-22 DIAGNOSIS — N186 End stage renal disease: Secondary | ICD-10-CM | POA: Diagnosis not present

## 2017-07-22 DIAGNOSIS — E440 Moderate protein-calorie malnutrition: Secondary | ICD-10-CM | POA: Diagnosis not present

## 2017-07-22 DIAGNOSIS — D631 Anemia in chronic kidney disease: Secondary | ICD-10-CM | POA: Diagnosis not present

## 2017-07-22 DIAGNOSIS — R17 Unspecified jaundice: Secondary | ICD-10-CM | POA: Diagnosis not present

## 2017-07-22 DIAGNOSIS — Z79899 Other long term (current) drug therapy: Secondary | ICD-10-CM | POA: Diagnosis not present

## 2017-07-22 DIAGNOSIS — R8299 Other abnormal findings in urine: Secondary | ICD-10-CM | POA: Diagnosis not present

## 2017-07-22 DIAGNOSIS — D509 Iron deficiency anemia, unspecified: Secondary | ICD-10-CM | POA: Diagnosis not present

## 2017-07-23 DIAGNOSIS — D509 Iron deficiency anemia, unspecified: Secondary | ICD-10-CM | POA: Diagnosis not present

## 2017-07-23 DIAGNOSIS — N2581 Secondary hyperparathyroidism of renal origin: Secondary | ICD-10-CM | POA: Diagnosis not present

## 2017-07-23 DIAGNOSIS — E440 Moderate protein-calorie malnutrition: Secondary | ICD-10-CM | POA: Diagnosis not present

## 2017-07-23 DIAGNOSIS — R8299 Other abnormal findings in urine: Secondary | ICD-10-CM | POA: Diagnosis not present

## 2017-07-23 DIAGNOSIS — N186 End stage renal disease: Secondary | ICD-10-CM | POA: Diagnosis not present

## 2017-07-23 DIAGNOSIS — R17 Unspecified jaundice: Secondary | ICD-10-CM | POA: Diagnosis not present

## 2017-07-23 DIAGNOSIS — Z79899 Other long term (current) drug therapy: Secondary | ICD-10-CM | POA: Diagnosis not present

## 2017-07-23 DIAGNOSIS — D631 Anemia in chronic kidney disease: Secondary | ICD-10-CM | POA: Diagnosis not present

## 2017-07-24 DIAGNOSIS — N2581 Secondary hyperparathyroidism of renal origin: Secondary | ICD-10-CM | POA: Diagnosis not present

## 2017-07-24 DIAGNOSIS — D509 Iron deficiency anemia, unspecified: Secondary | ICD-10-CM | POA: Diagnosis not present

## 2017-07-24 DIAGNOSIS — E440 Moderate protein-calorie malnutrition: Secondary | ICD-10-CM | POA: Diagnosis not present

## 2017-07-24 DIAGNOSIS — R17 Unspecified jaundice: Secondary | ICD-10-CM | POA: Diagnosis not present

## 2017-07-24 DIAGNOSIS — R8299 Other abnormal findings in urine: Secondary | ICD-10-CM | POA: Diagnosis not present

## 2017-07-24 DIAGNOSIS — Z79899 Other long term (current) drug therapy: Secondary | ICD-10-CM | POA: Diagnosis not present

## 2017-07-24 DIAGNOSIS — N186 End stage renal disease: Secondary | ICD-10-CM | POA: Diagnosis not present

## 2017-07-24 DIAGNOSIS — D631 Anemia in chronic kidney disease: Secondary | ICD-10-CM | POA: Diagnosis not present

## 2017-07-25 DIAGNOSIS — R17 Unspecified jaundice: Secondary | ICD-10-CM | POA: Diagnosis not present

## 2017-07-25 DIAGNOSIS — R8299 Other abnormal findings in urine: Secondary | ICD-10-CM | POA: Diagnosis not present

## 2017-07-25 DIAGNOSIS — D509 Iron deficiency anemia, unspecified: Secondary | ICD-10-CM | POA: Diagnosis not present

## 2017-07-25 DIAGNOSIS — N2581 Secondary hyperparathyroidism of renal origin: Secondary | ICD-10-CM | POA: Diagnosis not present

## 2017-07-25 DIAGNOSIS — N186 End stage renal disease: Secondary | ICD-10-CM | POA: Diagnosis not present

## 2017-07-25 DIAGNOSIS — Z79899 Other long term (current) drug therapy: Secondary | ICD-10-CM | POA: Diagnosis not present

## 2017-07-25 DIAGNOSIS — D631 Anemia in chronic kidney disease: Secondary | ICD-10-CM | POA: Diagnosis not present

## 2017-07-25 DIAGNOSIS — E440 Moderate protein-calorie malnutrition: Secondary | ICD-10-CM | POA: Diagnosis not present

## 2017-07-26 DIAGNOSIS — Z79899 Other long term (current) drug therapy: Secondary | ICD-10-CM | POA: Diagnosis not present

## 2017-07-26 DIAGNOSIS — N186 End stage renal disease: Secondary | ICD-10-CM | POA: Diagnosis not present

## 2017-07-26 DIAGNOSIS — R17 Unspecified jaundice: Secondary | ICD-10-CM | POA: Diagnosis not present

## 2017-07-26 DIAGNOSIS — E440 Moderate protein-calorie malnutrition: Secondary | ICD-10-CM | POA: Diagnosis not present

## 2017-07-26 DIAGNOSIS — R8299 Other abnormal findings in urine: Secondary | ICD-10-CM | POA: Diagnosis not present

## 2017-07-26 DIAGNOSIS — D509 Iron deficiency anemia, unspecified: Secondary | ICD-10-CM | POA: Diagnosis not present

## 2017-07-26 DIAGNOSIS — N2581 Secondary hyperparathyroidism of renal origin: Secondary | ICD-10-CM | POA: Diagnosis not present

## 2017-07-26 DIAGNOSIS — D631 Anemia in chronic kidney disease: Secondary | ICD-10-CM | POA: Diagnosis not present

## 2017-07-26 NOTE — Progress Notes (Deleted)
3 MONTH FOLLOW UP  Assessment:     Over 30 minutes of exam, counseling, chart review, and critical decision making was performed    Subjective:   Eileen Perez is a 76 y.o. female who presents for 3 month follow up on hypertension, prediabetes, hyperlipidemia, vitamin D def.    Her blood pressure has been controlled at home, today their BP is   She does not workout. She denies chest pain, shortness of breath, dizziness.  She is on peritoneal dialysis, had it yesterday, doing well, no signs of fluid overload, she is on peritoneal dialysis.  Lab Results  Component Value Date   GFRAA <4 (L) 06/15/2017   She is not on cholesterol medication and denies myalgias. Her cholesterol is at goal. The cholesterol last visit was:   Lab Results  Component Value Date   CHOL 180 06/15/2017   HDL 58 06/15/2017   LDLCALC 93 06/15/2017   TRIG 147 06/15/2017   CHOLHDL 3.1 06/15/2017   She has been working on diet and exercise for prediabetes but last visit she went into the Dm range, she does not drink any sugars, admits to increased cake eating/bread, and denies foot ulcerations, hyperglycemia, hypoglycemia , increased appetite, nausea, paresthesia of the feet, polydipsia, polyuria, visual disturbances, vomiting and weight loss. Last A1C in the office was:  Lab Results  Component Value Date   HGBA1C 6.6 (H) 06/15/2017  Patient is on Vitamin D supplement. Lab Results  Component Value Date   VD25OH 33 06/15/2017      Patient is on allopurinol for gout and does not report a recent flare.  Lab Results  Component Value Date   LABURIC 2.8 06/15/2017    Medication Review Current Outpatient Prescriptions on File Prior to Visit  Medication Sig Dispense Refill  . allopurinol (ZYLOPRIM) 300 MG tablet Take 300 mg by mouth every other day.    Marland Kitchen aspirin 81 MG tablet Take 81 mg by mouth at bedtime.     Marland Kitchen b complex-vitamin c-folic acid (NEPHRO-VITE) 0.8 MG TABS tablet Take 1 tablet by mouth at bedtime.     . calcitRIOL (ROCALTROL) 0.5 MCG capsule Take 0.5 mcg by mouth every other day.    . calcium acetate (PHOSLO) 667 MG capsule Take 667 mg by mouth 3 (three) times daily with meals.     . Cholecalciferol (VITAMIN D-3 PO) Take 5,000 Units by mouth every other day.     . cinacalcet (SENSIPAR) 30 MG tablet Take 30 mg by mouth daily.    Marland Kitchen docusate sodium (COLACE) 100 MG capsule Take 100 mg by mouth 2 (two) times daily.    . Flaxseed, Linseed, (FLAXSEED OIL) 1000 MG CAPS Take 1 capsule by mouth daily.     Marland Kitchen glucose blood (ACCU-CHEK AVIVA PLUS) test strip Test once daily 100 each 6  . hydrALAZINE (APRESOLINE) 10 MG tablet TAKE 2 TABLETS BY MOUTH 2  TIMES DAILY. (Patient taking differently: Take 20 mg by mouth 2 (two) times daily. TAKE 2 TABLETS BY MOUTH 2  TIMES DAILY.) 360 tablet 3  . labetalol (NORMODYNE) 200 MG tablet TAKE 1 TABLET BY MOUTH TWO  TIMES DAILY 180 tablet 0  . Omega-3 Fatty Acids (FISH OIL) 1200 MG CAPS Take 2 capsules by mouth daily.    . sevelamer carbonate (RENVELA) 800 MG tablet Take 1,600 mg by mouth 3 (three) times daily with meals.     . zinc gluconate 50 MG tablet Take 50 mg by mouth every morning.    . [  DISCONTINUED] diltiazem (CARDIZEM) 120 MG tablet Take 120 mg by mouth 2 (two) times daily.     No current facility-administered medications on file prior to visit.     Current Problems (verified) Patient Active Problem List   Diagnosis Date Noted  . T2_NIDDM w/ESRD ([place]) 05/19/2016  . Medicare annual wellness exam 10/31/2015  . BMI 25.0-25.9,adult 10/31/2015  . ESRD (end stage renal disease) ([place]) 10/31/2015  . OA (osteoarthritis) of hip 05/26/2015  . Secondary hyperparathyroidism ([facility]) 04/16/2014  . Vitamin D deficiency 04/16/2014  . Hyperlipidemia 04/16/2014  . Medication management 04/16/2014  . Anemia of chronic Renal Dz   . Pancreatic cyst   . GERD   . Gout   . Osteopenia   . Hypertension 06/10/2013  . DJD 06/09/2013   Review of Systems  Constitutional:  Negative.   HENT: Negative.   Eyes: Negative.   Respiratory: Negative.   Cardiovascular: Negative.   Gastrointestinal: Negative.   Genitourinary: Negative.   Musculoskeletal: Negative.   Skin: Negative.      Objective:   There were no vitals filed for this visit. There is no height or weight on file to calculate BMI.  General appearance: alert, no distress, WD/WN,  female HEENT: normocephalic, sclerae anicteric, TMs pearly, nares patent, no discharge or erythema, pharynx normal Oral cavity: MMM, no lesions Neck: supple, no lymphadenopathy, no thyromegaly, no masses Heart: RRR, normal S1, S2, no murmurs Lungs: CTA bilaterally, no wheezes, rhonchi, or rales Abdomen: +bs, soft, non tender, non distended, no masses, no hepatomegaly, no splenomegaly Musculoskeletal: nontender, no swelling, no obvious deformity Extremities: no edema, no cyanosis, no clubbing Pulses: 2+ symmetric, upper and lower extremities, normal cap refill Neurological: alert, oriented x 3, CN2-12 intact, strength normal upper extremities and lower extremities, sensation normal throughout, DTRs 2+ throughout, no cerebellar signs, gait normal Psychiatric: normal affect, behavior normal, pleasant     [name], PA-C   07/26/2017

## 2017-07-27 ENCOUNTER — Ambulatory Visit: Payer: Self-pay | Admitting: Physician Assistant

## 2017-07-27 DIAGNOSIS — E440 Moderate protein-calorie malnutrition: Secondary | ICD-10-CM | POA: Diagnosis not present

## 2017-07-27 DIAGNOSIS — R17 Unspecified jaundice: Secondary | ICD-10-CM | POA: Diagnosis not present

## 2017-07-27 DIAGNOSIS — Z79899 Other long term (current) drug therapy: Secondary | ICD-10-CM | POA: Diagnosis not present

## 2017-07-27 DIAGNOSIS — N2581 Secondary hyperparathyroidism of renal origin: Secondary | ICD-10-CM | POA: Diagnosis not present

## 2017-07-27 DIAGNOSIS — D631 Anemia in chronic kidney disease: Secondary | ICD-10-CM | POA: Diagnosis not present

## 2017-07-27 DIAGNOSIS — N186 End stage renal disease: Secondary | ICD-10-CM | POA: Diagnosis not present

## 2017-07-27 DIAGNOSIS — D509 Iron deficiency anemia, unspecified: Secondary | ICD-10-CM | POA: Diagnosis not present

## 2017-07-27 DIAGNOSIS — R8299 Other abnormal findings in urine: Secondary | ICD-10-CM | POA: Diagnosis not present

## 2017-07-28 DIAGNOSIS — N2581 Secondary hyperparathyroidism of renal origin: Secondary | ICD-10-CM | POA: Diagnosis not present

## 2017-07-28 DIAGNOSIS — E440 Moderate protein-calorie malnutrition: Secondary | ICD-10-CM | POA: Diagnosis not present

## 2017-07-28 DIAGNOSIS — N186 End stage renal disease: Secondary | ICD-10-CM | POA: Diagnosis not present

## 2017-07-28 DIAGNOSIS — Z79899 Other long term (current) drug therapy: Secondary | ICD-10-CM | POA: Diagnosis not present

## 2017-07-28 DIAGNOSIS — R8299 Other abnormal findings in urine: Secondary | ICD-10-CM | POA: Diagnosis not present

## 2017-07-28 DIAGNOSIS — D631 Anemia in chronic kidney disease: Secondary | ICD-10-CM | POA: Diagnosis not present

## 2017-07-28 DIAGNOSIS — R17 Unspecified jaundice: Secondary | ICD-10-CM | POA: Diagnosis not present

## 2017-07-28 DIAGNOSIS — D509 Iron deficiency anemia, unspecified: Secondary | ICD-10-CM | POA: Diagnosis not present

## 2017-07-29 DIAGNOSIS — N2581 Secondary hyperparathyroidism of renal origin: Secondary | ICD-10-CM | POA: Diagnosis not present

## 2017-07-29 DIAGNOSIS — R8299 Other abnormal findings in urine: Secondary | ICD-10-CM | POA: Diagnosis not present

## 2017-07-29 DIAGNOSIS — Z79899 Other long term (current) drug therapy: Secondary | ICD-10-CM | POA: Diagnosis not present

## 2017-07-29 DIAGNOSIS — D631 Anemia in chronic kidney disease: Secondary | ICD-10-CM | POA: Diagnosis not present

## 2017-07-29 DIAGNOSIS — N186 End stage renal disease: Secondary | ICD-10-CM | POA: Diagnosis not present

## 2017-07-29 DIAGNOSIS — R17 Unspecified jaundice: Secondary | ICD-10-CM | POA: Diagnosis not present

## 2017-07-29 DIAGNOSIS — E440 Moderate protein-calorie malnutrition: Secondary | ICD-10-CM | POA: Diagnosis not present

## 2017-07-29 DIAGNOSIS — D509 Iron deficiency anemia, unspecified: Secondary | ICD-10-CM | POA: Diagnosis not present

## 2017-07-30 DIAGNOSIS — N2581 Secondary hyperparathyroidism of renal origin: Secondary | ICD-10-CM | POA: Diagnosis not present

## 2017-07-30 DIAGNOSIS — D509 Iron deficiency anemia, unspecified: Secondary | ICD-10-CM | POA: Diagnosis not present

## 2017-07-30 DIAGNOSIS — N186 End stage renal disease: Secondary | ICD-10-CM | POA: Diagnosis not present

## 2017-07-30 DIAGNOSIS — E440 Moderate protein-calorie malnutrition: Secondary | ICD-10-CM | POA: Diagnosis not present

## 2017-07-30 DIAGNOSIS — R17 Unspecified jaundice: Secondary | ICD-10-CM | POA: Diagnosis not present

## 2017-07-30 DIAGNOSIS — Z79899 Other long term (current) drug therapy: Secondary | ICD-10-CM | POA: Diagnosis not present

## 2017-07-30 DIAGNOSIS — D631 Anemia in chronic kidney disease: Secondary | ICD-10-CM | POA: Diagnosis not present

## 2017-07-30 DIAGNOSIS — R8299 Other abnormal findings in urine: Secondary | ICD-10-CM | POA: Diagnosis not present

## 2017-07-31 DIAGNOSIS — D509 Iron deficiency anemia, unspecified: Secondary | ICD-10-CM | POA: Diagnosis not present

## 2017-07-31 DIAGNOSIS — N186 End stage renal disease: Secondary | ICD-10-CM | POA: Diagnosis not present

## 2017-07-31 DIAGNOSIS — D631 Anemia in chronic kidney disease: Secondary | ICD-10-CM | POA: Diagnosis not present

## 2017-07-31 DIAGNOSIS — N2581 Secondary hyperparathyroidism of renal origin: Secondary | ICD-10-CM | POA: Diagnosis not present

## 2017-07-31 DIAGNOSIS — R17 Unspecified jaundice: Secondary | ICD-10-CM | POA: Diagnosis not present

## 2017-07-31 DIAGNOSIS — E440 Moderate protein-calorie malnutrition: Secondary | ICD-10-CM | POA: Diagnosis not present

## 2017-07-31 DIAGNOSIS — R8299 Other abnormal findings in urine: Secondary | ICD-10-CM | POA: Diagnosis not present

## 2017-07-31 DIAGNOSIS — Z79899 Other long term (current) drug therapy: Secondary | ICD-10-CM | POA: Diagnosis not present

## 2017-08-01 DIAGNOSIS — N2581 Secondary hyperparathyroidism of renal origin: Secondary | ICD-10-CM | POA: Diagnosis not present

## 2017-08-01 DIAGNOSIS — D509 Iron deficiency anemia, unspecified: Secondary | ICD-10-CM | POA: Diagnosis not present

## 2017-08-01 DIAGNOSIS — D631 Anemia in chronic kidney disease: Secondary | ICD-10-CM | POA: Diagnosis not present

## 2017-08-01 DIAGNOSIS — N186 End stage renal disease: Secondary | ICD-10-CM | POA: Diagnosis not present

## 2017-08-01 DIAGNOSIS — E440 Moderate protein-calorie malnutrition: Secondary | ICD-10-CM | POA: Diagnosis not present

## 2017-08-01 DIAGNOSIS — R8299 Other abnormal findings in urine: Secondary | ICD-10-CM | POA: Diagnosis not present

## 2017-08-01 DIAGNOSIS — R17 Unspecified jaundice: Secondary | ICD-10-CM | POA: Diagnosis not present

## 2017-08-01 DIAGNOSIS — Z79899 Other long term (current) drug therapy: Secondary | ICD-10-CM | POA: Diagnosis not present

## 2017-08-02 DIAGNOSIS — D509 Iron deficiency anemia, unspecified: Secondary | ICD-10-CM | POA: Diagnosis not present

## 2017-08-02 DIAGNOSIS — R8299 Other abnormal findings in urine: Secondary | ICD-10-CM | POA: Diagnosis not present

## 2017-08-02 DIAGNOSIS — N186 End stage renal disease: Secondary | ICD-10-CM | POA: Diagnosis not present

## 2017-08-02 DIAGNOSIS — Z79899 Other long term (current) drug therapy: Secondary | ICD-10-CM | POA: Diagnosis not present

## 2017-08-02 DIAGNOSIS — D631 Anemia in chronic kidney disease: Secondary | ICD-10-CM | POA: Diagnosis not present

## 2017-08-02 DIAGNOSIS — N2581 Secondary hyperparathyroidism of renal origin: Secondary | ICD-10-CM | POA: Diagnosis not present

## 2017-08-02 DIAGNOSIS — R17 Unspecified jaundice: Secondary | ICD-10-CM | POA: Diagnosis not present

## 2017-08-02 DIAGNOSIS — E440 Moderate protein-calorie malnutrition: Secondary | ICD-10-CM | POA: Diagnosis not present

## 2017-08-03 DIAGNOSIS — N186 End stage renal disease: Secondary | ICD-10-CM | POA: Diagnosis not present

## 2017-08-03 DIAGNOSIS — D509 Iron deficiency anemia, unspecified: Secondary | ICD-10-CM | POA: Diagnosis not present

## 2017-08-03 DIAGNOSIS — Z79899 Other long term (current) drug therapy: Secondary | ICD-10-CM | POA: Diagnosis not present

## 2017-08-03 DIAGNOSIS — E440 Moderate protein-calorie malnutrition: Secondary | ICD-10-CM | POA: Diagnosis not present

## 2017-08-03 DIAGNOSIS — R8299 Other abnormal findings in urine: Secondary | ICD-10-CM | POA: Diagnosis not present

## 2017-08-03 DIAGNOSIS — N2581 Secondary hyperparathyroidism of renal origin: Secondary | ICD-10-CM | POA: Diagnosis not present

## 2017-08-03 DIAGNOSIS — R17 Unspecified jaundice: Secondary | ICD-10-CM | POA: Diagnosis not present

## 2017-08-03 DIAGNOSIS — D631 Anemia in chronic kidney disease: Secondary | ICD-10-CM | POA: Diagnosis not present

## 2017-08-04 DIAGNOSIS — N186 End stage renal disease: Secondary | ICD-10-CM | POA: Diagnosis not present

## 2017-08-04 DIAGNOSIS — R8299 Other abnormal findings in urine: Secondary | ICD-10-CM | POA: Diagnosis not present

## 2017-08-04 DIAGNOSIS — Z79899 Other long term (current) drug therapy: Secondary | ICD-10-CM | POA: Diagnosis not present

## 2017-08-04 DIAGNOSIS — D509 Iron deficiency anemia, unspecified: Secondary | ICD-10-CM | POA: Diagnosis not present

## 2017-08-04 DIAGNOSIS — R17 Unspecified jaundice: Secondary | ICD-10-CM | POA: Diagnosis not present

## 2017-08-04 DIAGNOSIS — N2581 Secondary hyperparathyroidism of renal origin: Secondary | ICD-10-CM | POA: Diagnosis not present

## 2017-08-04 DIAGNOSIS — D631 Anemia in chronic kidney disease: Secondary | ICD-10-CM | POA: Diagnosis not present

## 2017-08-04 DIAGNOSIS — E440 Moderate protein-calorie malnutrition: Secondary | ICD-10-CM | POA: Diagnosis not present

## 2017-08-05 DIAGNOSIS — R17 Unspecified jaundice: Secondary | ICD-10-CM | POA: Diagnosis not present

## 2017-08-05 DIAGNOSIS — N186 End stage renal disease: Secondary | ICD-10-CM | POA: Diagnosis not present

## 2017-08-05 DIAGNOSIS — E440 Moderate protein-calorie malnutrition: Secondary | ICD-10-CM | POA: Diagnosis not present

## 2017-08-05 DIAGNOSIS — D631 Anemia in chronic kidney disease: Secondary | ICD-10-CM | POA: Diagnosis not present

## 2017-08-05 DIAGNOSIS — N2581 Secondary hyperparathyroidism of renal origin: Secondary | ICD-10-CM | POA: Diagnosis not present

## 2017-08-05 DIAGNOSIS — Z79899 Other long term (current) drug therapy: Secondary | ICD-10-CM | POA: Diagnosis not present

## 2017-08-05 DIAGNOSIS — R8299 Other abnormal findings in urine: Secondary | ICD-10-CM | POA: Diagnosis not present

## 2017-08-05 DIAGNOSIS — D509 Iron deficiency anemia, unspecified: Secondary | ICD-10-CM | POA: Diagnosis not present

## 2017-08-06 DIAGNOSIS — D631 Anemia in chronic kidney disease: Secondary | ICD-10-CM | POA: Diagnosis not present

## 2017-08-06 DIAGNOSIS — R17 Unspecified jaundice: Secondary | ICD-10-CM | POA: Diagnosis not present

## 2017-08-06 DIAGNOSIS — R8299 Other abnormal findings in urine: Secondary | ICD-10-CM | POA: Diagnosis not present

## 2017-08-06 DIAGNOSIS — N2581 Secondary hyperparathyroidism of renal origin: Secondary | ICD-10-CM | POA: Diagnosis not present

## 2017-08-06 DIAGNOSIS — N186 End stage renal disease: Secondary | ICD-10-CM | POA: Diagnosis not present

## 2017-08-06 DIAGNOSIS — D509 Iron deficiency anemia, unspecified: Secondary | ICD-10-CM | POA: Diagnosis not present

## 2017-08-06 DIAGNOSIS — E440 Moderate protein-calorie malnutrition: Secondary | ICD-10-CM | POA: Diagnosis not present

## 2017-08-06 DIAGNOSIS — Z79899 Other long term (current) drug therapy: Secondary | ICD-10-CM | POA: Diagnosis not present

## 2017-08-07 DIAGNOSIS — N186 End stage renal disease: Secondary | ICD-10-CM | POA: Diagnosis not present

## 2017-08-07 DIAGNOSIS — N2581 Secondary hyperparathyroidism of renal origin: Secondary | ICD-10-CM | POA: Diagnosis not present

## 2017-08-07 DIAGNOSIS — R17 Unspecified jaundice: Secondary | ICD-10-CM | POA: Diagnosis not present

## 2017-08-07 DIAGNOSIS — D509 Iron deficiency anemia, unspecified: Secondary | ICD-10-CM | POA: Diagnosis not present

## 2017-08-07 DIAGNOSIS — R8299 Other abnormal findings in urine: Secondary | ICD-10-CM | POA: Diagnosis not present

## 2017-08-07 DIAGNOSIS — E440 Moderate protein-calorie malnutrition: Secondary | ICD-10-CM | POA: Diagnosis not present

## 2017-08-07 DIAGNOSIS — D631 Anemia in chronic kidney disease: Secondary | ICD-10-CM | POA: Diagnosis not present

## 2017-08-07 DIAGNOSIS — Z79899 Other long term (current) drug therapy: Secondary | ICD-10-CM | POA: Diagnosis not present

## 2017-08-08 DIAGNOSIS — D509 Iron deficiency anemia, unspecified: Secondary | ICD-10-CM | POA: Diagnosis not present

## 2017-08-08 DIAGNOSIS — R17 Unspecified jaundice: Secondary | ICD-10-CM | POA: Diagnosis not present

## 2017-08-08 DIAGNOSIS — N2581 Secondary hyperparathyroidism of renal origin: Secondary | ICD-10-CM | POA: Diagnosis not present

## 2017-08-08 DIAGNOSIS — R8299 Other abnormal findings in urine: Secondary | ICD-10-CM | POA: Diagnosis not present

## 2017-08-08 DIAGNOSIS — E440 Moderate protein-calorie malnutrition: Secondary | ICD-10-CM | POA: Diagnosis not present

## 2017-08-08 DIAGNOSIS — N186 End stage renal disease: Secondary | ICD-10-CM | POA: Diagnosis not present

## 2017-08-08 DIAGNOSIS — D631 Anemia in chronic kidney disease: Secondary | ICD-10-CM | POA: Diagnosis not present

## 2017-08-08 DIAGNOSIS — Z79899 Other long term (current) drug therapy: Secondary | ICD-10-CM | POA: Diagnosis not present

## 2017-08-09 DIAGNOSIS — R8299 Other abnormal findings in urine: Secondary | ICD-10-CM | POA: Diagnosis not present

## 2017-08-09 DIAGNOSIS — N186 End stage renal disease: Secondary | ICD-10-CM | POA: Diagnosis not present

## 2017-08-09 DIAGNOSIS — Z79899 Other long term (current) drug therapy: Secondary | ICD-10-CM | POA: Diagnosis not present

## 2017-08-09 DIAGNOSIS — D631 Anemia in chronic kidney disease: Secondary | ICD-10-CM | POA: Diagnosis not present

## 2017-08-09 DIAGNOSIS — R17 Unspecified jaundice: Secondary | ICD-10-CM | POA: Diagnosis not present

## 2017-08-09 DIAGNOSIS — E440 Moderate protein-calorie malnutrition: Secondary | ICD-10-CM | POA: Diagnosis not present

## 2017-08-09 DIAGNOSIS — N2581 Secondary hyperparathyroidism of renal origin: Secondary | ICD-10-CM | POA: Diagnosis not present

## 2017-08-09 DIAGNOSIS — D509 Iron deficiency anemia, unspecified: Secondary | ICD-10-CM | POA: Diagnosis not present

## 2017-08-09 NOTE — Progress Notes (Signed)
FOLLOW UP  Assessment and Plan:   Eileen Perez was seen today for follow-up.  Diagnoses and all orders for this visit:  Essential hypertension  Continue medication  Monitor blood pressure at home.  Continue DASH diet.    Reminder to go to the ER if any CP, SOB, nausea, dizziness, severe HA, changes vision/speech, left arm numbness and tingling and jaw pain.  Magnesium  Diabetes mellitus due to underlying condition with diabetic nephropathy, without long-term current use of insulin (HCC)  Continue diet and exercise.   Perform daily foot/skin check, notify office of any concerning changes.   Check G8Q  BASIC METABOLIC PANEL WITH GFR  ESRD (end stage renal disease) (HCC)  BASIC METABOLIC PANEL WITH GFR   VITAMIN D 25 Hydroxy (Vit-D Deficiency, Fractures)  Vitamin D deficiency -     VITAMIN D 25 Hydroxy (Vit-D Deficiency, Fractures)  Hyperlipidemia  Continue diet and exercise.   Check lipid panel.   Medication management -     CBC with Differential/Platelet -     BASIC METABOLIC PANEL WITH GFR -     Hepatic function panel -     TSH -     Hemoglobin A1c -     Magnesium -     VITAMIN D 25 Hydroxy (Vit-D Deficiency, Fractures) -     Uric acid  Idiopathic gout, unspecified chronicity, unspecified site -     Uric acid  Insomnia -     Discussed sleep hygiene -     Start melatonin qHS   As she is going to the kidney center every two weeks and getting labs there, we will cancel monthly visits here and see her every 3-4 months instead.   Continue diet and meds as discussed. Further disposition pending results of labs. Discussed med's effects and SE's.   Over 30 minutes of exam, counseling, chart review, and critical decision making was performed.   Future Appointments Date Time Provider [place]  10/09/2017 8:30 AM Hayden Pedro, MD [facility] None  11/16/2017 10:45 AM Liane Comber, NP [facility] None  02/14/2018 11:00 AM Unk Pinto, MD  [facility] None    ----------------------------------------------------------------------------------------------------------------------  HPI 76 y.o. female  presents for 3 month follow up on hypertension, cholesterol, diabetes, end stage renal disease and vitamin D deficiency. She reports she has been experiencing difficulty sleeping (falling asleep) and waking up not rested for the last 3 weeks, used to get 7 hours of sleep. She currently watches TV in bed prior to sleeping; discussed sleep hygiene in length, as well as supplementing with melatonin.   Her blood pressure has been controlled at home, today their BP is BP: 136/74  She does not workout. She denies chest pain, shortness of breath, dizziness.  She is on peritoneal dialysis for her ESRD (goes to the kidney center q2 weeks), no signs of fluid overload. Port appears without signs of infection.    She is not on cholesterol medication; currently managed by diet and supplements. Her cholesterol is at goal. The cholesterol last visit was:  Lab Results  Component Value Date   CHOL 180 06/15/2017   HDL 58 06/15/2017   LDLCALC 93 06/15/2017   TRIG 147 06/15/2017   CHOLHDL 3.1 06/15/2017    She has been working on diet and exercise for diabetes, and denies foot ulcerations, hyperglycemia, hypoglycemia  and paresthesia of the feet. Last A1C in the office was:  Lab Results  Component Value Date   HGBA1C 6.6 (H) 06/15/2017   Patient  is on Vitamin D supplement.   Lab Results  Component Value Date   VD25OH 33 06/15/2017         Current Medications:  Current Outpatient Prescriptions on File Prior to Visit  Medication Sig  . allopurinol (ZYLOPRIM) 300 MG tablet Take 300 mg by mouth every other day.  Marland Kitchen aspirin 81 MG tablet Take 81 mg by mouth at bedtime.   Marland Kitchen b complex-vitamin c-folic acid (NEPHRO-VITE) 0.8 MG TABS tablet Take 1 tablet by mouth at bedtime.  . calcitRIOL (ROCALTROL) 0.5 MCG capsule Take 0.5 mcg by mouth every other  day.  . calcium acetate (PHOSLO) 667 MG capsule Take 667 mg by mouth 3 (three) times daily with meals.   . Cholecalciferol (VITAMIN D-3 PO) Take 5,000 Units by mouth every other day.   . cinacalcet (SENSIPAR) 30 MG tablet Take 30 mg by mouth daily.  Marland Kitchen docusate sodium (COLACE) 100 MG capsule Take 100 mg by mouth 2 (two) times daily.  . Flaxseed, Linseed, (FLAXSEED OIL) 1000 MG CAPS Take 1 capsule by mouth daily.   Marland Kitchen glucose blood (ACCU-CHEK AVIVA PLUS) test strip Test once daily  . hydrALAZINE (APRESOLINE) 10 MG tablet TAKE 2 TABLETS BY MOUTH 2  TIMES DAILY. (Patient taking differently: Take 20 mg by mouth 2 (two) times daily. TAKE 2 TABLETS BY MOUTH 2  TIMES DAILY.)  . labetalol (NORMODYNE) 200 MG tablet TAKE 1 TABLET BY MOUTH TWO  TIMES DAILY  . Omega-3 Fatty Acids (FISH OIL) 1200 MG CAPS Take 2 capsules by mouth daily.  . sevelamer carbonate (RENVELA) 800 MG tablet Take 1,600 mg by mouth 3 (three) times daily with meals.   . zinc gluconate 50 MG tablet Take 50 mg by mouth every morning.  . [DISCONTINUED] diltiazem (CARDIZEM) 120 MG tablet Take 120 mg by mouth 2 (two) times daily.   No current facility-administered medications on file prior to visit.      Allergies:  Allergies  Allergen Reactions  . Ace Inhibitors     unknown  . Nsaids     unknown     Medical History:  Past Medical History:  Diagnosis Date  . Anemia   . Arthritis    Osteoarthritis  . Chronic kidney disease    Chronic renal insufficiency  . Diabetes mellitus    Pre  . Diverticulosis   . GERD (gastroesophageal reflux disease)   . Gout   . Hiatal hernia   . History of blood transfusion   . Hyperlipidemia   . Hypertension   . Osteopenia   . Pancreatic cyst 1999  . Thyroid disease    Hyperparathyroid    Family history- Reviewed and unchanged Social history- Reviewed and unchanged   Review of Systems:  Review of Systems  Constitutional: Negative.   HENT: Negative for congestion, hearing loss and  sore throat.   Eyes: Negative for blurred vision.  Respiratory: Negative for cough, shortness of breath and wheezing.   Cardiovascular: Negative for chest pain and palpitations.  Gastrointestinal: Negative for abdominal pain, blood in stool, constipation, melena and nausea.  Genitourinary: Negative.   Musculoskeletal: Negative for falls and myalgias.  Skin: Negative for itching and rash.  Neurological: Negative for dizziness, sensory change and headaches.  Endo/Heme/Allergies: Negative.   Psychiatric/Behavioral: Negative for depression and memory loss. The patient has insomnia. The patient is not nervous/anxious.       Physical Exam: BP 136/74   Pulse 76   Temp (!) 97.1 F (36.2 C)   Resp 18  Ht 5\' 1"  (1.549 m)   Wt 155 lb 9.6 oz (70.6 kg)   BMI 29.40 kg/m  Wt Readings from Last 3 Encounters:  08/10/17 155 lb 9.6 oz (70.6 kg)  06/15/17 158 lb (71.7 kg)  03/16/17 154 lb 3.2 oz (69.9 kg)   General Appearance: Well nourished, in no apparent distress. Eyes: PERRLA, EOMs, conjunctiva no swelling or erythema Sinuses: No Frontal/maxillary tenderness ENT/Mouth: Left ext aud canal clear, right side obstructed by soft cerumen, Left TM without erythema, bulging. No erythema, swelling, or exudate on post pharynx.  Tonsils not swollen or erythematous. Hearing normal.  Neck: Supple, thyroid normal.  Respiratory: Respiratory effort normal, BS equal bilaterally without rales, rhonchi, wheezing or stridor.  Cardio: RRR with no MRGs. Brisk peripheral pulses without edema.  Abdomen: Soft, + BS.  Non tender, no guarding, rebound, hernias, masses. Lymphatics: Non tender without lymphadenopathy.  Musculoskeletal: Full ROM, 5/5 strength, Normal gait Skin: Warm, dry without rashes, lesions, ecchymosis. Area surrounding dialysis access site not injected, no discharge.   Neuro: Cranial nerves intact. No cerebellar symptoms.  Psych: Awake and oriented X 3, normal affect, Insight and Judgment  appropriate.    Izora Ribas, NP 12:15 PM [facility]

## 2017-08-10 ENCOUNTER — Encounter: Payer: Self-pay | Admitting: Adult Health

## 2017-08-10 ENCOUNTER — Ambulatory Visit (INDEPENDENT_AMBULATORY_CARE_PROVIDER_SITE_OTHER): Payer: Medicare Other | Admitting: Adult Health

## 2017-08-10 VITALS — BP 136/74 | HR 76 | Temp 97.1°F | Resp 18 | Ht 61.0 in | Wt 155.6 lb

## 2017-08-10 DIAGNOSIS — I10 Essential (primary) hypertension: Secondary | ICD-10-CM

## 2017-08-10 DIAGNOSIS — R17 Unspecified jaundice: Secondary | ICD-10-CM | POA: Diagnosis not present

## 2017-08-10 DIAGNOSIS — E0821 Diabetes mellitus due to underlying condition with diabetic nephropathy: Secondary | ICD-10-CM

## 2017-08-10 DIAGNOSIS — E559 Vitamin D deficiency, unspecified: Secondary | ICD-10-CM | POA: Diagnosis not present

## 2017-08-10 DIAGNOSIS — E782 Mixed hyperlipidemia: Secondary | ICD-10-CM | POA: Diagnosis not present

## 2017-08-10 DIAGNOSIS — N186 End stage renal disease: Secondary | ICD-10-CM

## 2017-08-10 DIAGNOSIS — G4700 Insomnia, unspecified: Secondary | ICD-10-CM

## 2017-08-10 DIAGNOSIS — R8299 Other abnormal findings in urine: Secondary | ICD-10-CM | POA: Diagnosis not present

## 2017-08-10 DIAGNOSIS — Z79899 Other long term (current) drug therapy: Secondary | ICD-10-CM

## 2017-08-10 DIAGNOSIS — D509 Iron deficiency anemia, unspecified: Secondary | ICD-10-CM | POA: Diagnosis not present

## 2017-08-10 DIAGNOSIS — M1000 Idiopathic gout, unspecified site: Secondary | ICD-10-CM

## 2017-08-10 DIAGNOSIS — D631 Anemia in chronic kidney disease: Secondary | ICD-10-CM | POA: Diagnosis not present

## 2017-08-10 DIAGNOSIS — E440 Moderate protein-calorie malnutrition: Secondary | ICD-10-CM | POA: Diagnosis not present

## 2017-08-10 DIAGNOSIS — N2581 Secondary hyperparathyroidism of renal origin: Secondary | ICD-10-CM | POA: Diagnosis not present

## 2017-08-10 NOTE — Patient Instructions (Signed)
Melatonin oral solid dosage forms What is this medicine? MELATONIN (mel uh TOH nin) is a dietary supplement. It is mostly promoted to help maintain normal sleep patterns. The FDA has not approved this supplement for any medical use. This supplement may be used for other purposes; ask your health care provider or pharmacist if you have questions. This medicine may be used for other purposes; ask your health care provider or pharmacist if you have questions. COMMON BRAND NAME(S): Melatonex What should I tell my health care provider before I take this medicine? They need to know if you have any of these conditions: -cancer -depression or mental illness -diabetes -hormone problems -if you often drink alcohol -immune system problems -liver disease -lung or breathing disease, like asthma -organ transplant -seizure disorder -an unusual or allergic reaction to melatonin, other medicines, foods, dyes, or preservatives -pregnant or trying to get pregnant -breast-feeding How should I use this medicine? Take this supplement by mouth with a glass of water. Do not take with food. This supplement is usually taken 1 or 2 hours before bedtime. After taking this supplement, limit your activities to those needed to prepare for bed. Some products may be chewed or dissolved in the mouth before swallowing. Some tablets or capsules must be swallowed whole; do not cut, crush or chew. Follow the directions on the package labeling, or take as directed by your health care professional. Do not take this supplement more often than directed. Talk to your pediatrician regarding the use of this supplement in children. Special care may be needed. This supplement is not recommended for use in children without a prescription. Overdosage: If you think you have taken too much of this medicine contact a poison control center or emergency room at once. NOTE: This medicine is only for you. Do not share this medicine with  others. What if I miss a dose? If you miss taking your dose at the usual time, skip that dose. If it is almost time for your next dose, take only that dose. Do not take double or extra doses. What may interact with this medicine? Do not take this medicine with any of the following medications: -fluvoxamine -ramelteon -tasimelteon This medicine may also interact with the following medications: -alcohol -caffeine -carbamazepine -certain antibiotics like ciprofloxacin -certain medicines for depression, anxiety, or psychotic disturbances -cimetidine -female hormones, like estrogens and birth control pills, patches, rings, or injections -methoxsalen -nifedipine -other medications for sleep -other herbal or dietary supplements -phenobarbital -rifampin -smoking tobacco -tamoxifen -warfarin This list may not describe all possible interactions. Give your health care provider a list of all the medicines, herbs, non-prescription drugs, or dietary supplements you use. Also tell them if you smoke, drink alcohol, or use illegal drugs. Some items may interact with your medicine. What should I watch for while using this medicine? See your doctor if your symptoms do not get better or if they get worse. Do not take this supplement for more than 2 weeks unless your doctor tells you to. You may get drowsy or dizzy. Do not drive, use machinery, or do anything that needs mental alertness until you know how this medicine affects you. Do not stand or sit up quickly, especially if you are an older patient. This reduces the risk of dizzy or fainting spells. Alcohol may interfere with the effect of this medicine. Avoid alcoholic drinks. Talk to your doctor before you use this supplement if you are currently being treated for an emotional, mental, or sleep problem. This medicine  may interfere with your treatment. Herbal or dietary supplements are not regulated like medicines. Rigid quality control standards are  not required for dietary supplements. The purity and strength of these products can vary. The safety and effect of this dietary supplement for a certain disease or illness is not well known. This product is not intended to diagnose, treat, cure or prevent any disease. The Food and Drug Administration suggests the following to help consumers protect themselves: -Always read product labels and follow directions. -Natural does not mean a product is safe for humans to take. -Look for products that include USP after the ingredient name. This means that the manufacturer followed the standards of the [place] Pharmacopoeia. -Supplements made or sold by a nationally known food or drug company are more likely to be made under tight controls. You can write to the company for more information about how the product was made. What side effects may I notice from receiving this medicine? Side effects that you should report to your doctor or health care professional as soon as possible: -allergic reactions like skin rash, itching or hives, swelling of the face, lips, or tongue -breathing problems -confusion -depressed mood, irritable, or other changes in moods or behaviors -feeling faint or lightheaded, falls -increased blood pressure -irregular or missed menstrual periods -signs and symptoms of liver injury like dark yellow or brown urine; general ill feeling or flu-like symptoms; light-colored stools; loss of appetite; nausea; right upper belly pain; unusually weak or tired; yellowing of the eyes or skin -trouble staying awake or alert during the day -unusual activities while you are still asleep like driving, eating, making phone calls -unusual bleeding or bruising Side effects that usually do not require medical attention (report to your doctor or health care professional if they continue or are bothersome): -dizziness -drowsiness -headache -hot flashes -nausea -tiredness -unusual dreams or  nightmares -upset stomach This list may not describe all possible side effects. Call your doctor for medical advice about side effects. You may report side effects to FDA at 1-800-FDA-1088. Where should I keep my medicine? Keep out of the reach of children. Store at room temperature or as directed on the package label. Protect from moisture. Throw away any unused supplement after the expiration date. NOTE: This sheet is a summary. It may not cover all possible information. If you have questions about this medicine, talk to your doctor, pharmacist, or health care provider.  2018 Elsevier/Gold Standard (2016-07-31 14:38:22)    Insomnia Insomnia is a sleep disorder that makes it difficult to fall asleep or to stay asleep. Insomnia can cause tiredness (fatigue), low energy, difficulty concentrating, mood swings, and poor performance at work or school. There are three different ways to classify insomnia:  Difficulty falling asleep.  Difficulty staying asleep.  Waking up too early in the morning.  Any type of insomnia can be long-term (chronic) or short-term (acute). Both are common. Short-term insomnia usually lasts for three months or less. Chronic insomnia occurs at least three times a week for longer than three months. What are the causes? Insomnia may be caused by another condition, situation, or substance, such as:  Anxiety.  Certain medicines.  Gastroesophageal reflux disease (GERD) or other gastrointestinal conditions.  Asthma or other breathing conditions.  Restless legs syndrome, sleep apnea, or other sleep disorders.  Chronic pain.  Menopause. This may include hot flashes.  Stroke.  Abuse of alcohol, tobacco, or illegal drugs.  Depression.  Caffeine.  Neurological disorders, such as Alzheimer disease.  An  overactive thyroid (hyperthyroidism).  The cause of insomnia may not be known. What increases the risk? Risk factors for insomnia include:  Gender. Women  are more commonly affected than men.  Age. Insomnia is more common as you get older.  Stress. This may involve your professional or personal life.  Income. Insomnia is more common in people with lower income.  Lack of exercise.  Irregular work schedule or night shifts.  Traveling between different time zones.  What are the signs or symptoms? If you have insomnia, trouble falling asleep or trouble staying asleep is the main symptom. This may lead to other symptoms, such as:  Feeling fatigued.  Feeling nervous about going to sleep.  Not feeling rested in the morning.  Having trouble concentrating.  Feeling irritable, anxious, or depressed.  How is this treated? Treatment for insomnia depends on the cause. If your insomnia is caused by an underlying condition, treatment will focus on addressing the condition. Treatment may also include:  Medicines to help you sleep.  Counseling or therapy.  Lifestyle adjustments.  Follow these instructions at home:  Take medicines only as directed by your health care provider.  Keep regular sleeping and waking hours. Avoid naps.  Keep a sleep diary to help you and your health care provider figure out what could be causing your insomnia. Include: ? When you sleep. ? When you wake up during the night. ? How well you sleep. ? How rested you feel the next day. ? Any side effects of medicines you are taking. ? What you eat and drink.  Make your bedroom a comfortable place where it is easy to fall asleep: ? Put up shades or special blackout curtains to block light from outside. ? Use a white noise machine to block noise. ? Keep the temperature cool.  Exercise regularly as directed by your health care provider. Avoid exercising right before bedtime.  Use relaxation techniques to manage stress. Ask your health care provider to suggest some techniques that may work well for you. These may include: ? Breathing exercises. ? Routines to  release muscle tension. ? Visualizing peaceful scenes.  Cut back on alcohol, caffeinated beverages, and cigarettes, especially close to bedtime. These can disrupt your sleep.  Do not overeat or eat spicy foods right before bedtime. This can lead to digestive discomfort that can make it hard for you to sleep.  Limit screen use before bedtime. This includes: ? Watching TV. ? Using your smartphone, tablet, and computer.  Stick to a routine. This can help you fall asleep faster. Try to do a quiet activity, brush your teeth, and go to bed at the same time each night.  Get out of bed if you are still awake after 15 minutes of trying to sleep. Keep the lights down, but try reading or doing a quiet activity. When you feel sleepy, go back to bed.  Make sure that you drive carefully. Avoid driving if you feel very sleepy.  Keep all follow-up appointments as directed by your health care provider. This is important. Contact a health care provider if:  You are tired throughout the day or have trouble in your daily routine due to sleepiness.  You continue to have sleep problems or your sleep problems get worse. Get help right away if:  You have serious thoughts about hurting yourself or someone else. This information is not intended to replace advice given to you by your health care provider. Make sure you discuss any questions you have with  your health care provider. Document Released: 11/03/2000 Document Revised: 04/07/2016 Document Reviewed: 08/07/2014 Elsevier Interactive Patient Education  [employer].

## 2017-08-11 DIAGNOSIS — R17 Unspecified jaundice: Secondary | ICD-10-CM | POA: Diagnosis not present

## 2017-08-11 DIAGNOSIS — E440 Moderate protein-calorie malnutrition: Secondary | ICD-10-CM | POA: Diagnosis not present

## 2017-08-11 DIAGNOSIS — Z79899 Other long term (current) drug therapy: Secondary | ICD-10-CM | POA: Diagnosis not present

## 2017-08-11 DIAGNOSIS — R8299 Other abnormal findings in urine: Secondary | ICD-10-CM | POA: Diagnosis not present

## 2017-08-11 DIAGNOSIS — N186 End stage renal disease: Secondary | ICD-10-CM | POA: Diagnosis not present

## 2017-08-11 DIAGNOSIS — D631 Anemia in chronic kidney disease: Secondary | ICD-10-CM | POA: Diagnosis not present

## 2017-08-11 DIAGNOSIS — N2581 Secondary hyperparathyroidism of renal origin: Secondary | ICD-10-CM | POA: Diagnosis not present

## 2017-08-11 DIAGNOSIS — D509 Iron deficiency anemia, unspecified: Secondary | ICD-10-CM | POA: Diagnosis not present

## 2017-08-11 LAB — CBC WITH DIFFERENTIAL/PLATELET
Basophils Absolute: 7 cells/uL (ref 0–200)
Basophils Relative: 0.1 %
EOS ABS: 180 {cells}/uL (ref 15–500)
Eosinophils Relative: 2.5 %
HCT: 22.5 % — ABNORMAL LOW (ref 35.0–45.0)
Hemoglobin: 7.3 g/dL — ABNORMAL LOW (ref 11.7–15.5)
Lymphs Abs: 2074 cells/uL (ref 850–3900)
MCH: 31.6 pg (ref 27.0–33.0)
MCHC: 32.4 g/dL (ref 32.0–36.0)
MCV: 97.4 fL (ref 80.0–100.0)
MPV: 9.8 fL (ref 7.5–12.5)
Monocytes Relative: 6 %
NEUTROS PCT: 62.6 %
Neutro Abs: 4507 cells/uL (ref 1500–7800)
PLATELETS: 233 10*3/uL (ref 140–400)
RBC: 2.31 10*6/uL — AB (ref 3.80–5.10)
RDW: 18.1 % — AB (ref 11.0–15.0)
TOTAL LYMPHOCYTE: 28.8 %
WBC: 7.2 10*3/uL (ref 3.8–10.8)
WBCMIX: 432 {cells}/uL (ref 200–950)

## 2017-08-11 LAB — HEPATIC FUNCTION PANEL
AG Ratio: 1.3 (calc) (ref 1.0–2.5)
ALT: 13 U/L (ref 6–29)
AST: 18 U/L (ref 10–35)
Albumin: 3.6 g/dL (ref 3.6–5.1)
Alkaline phosphatase (APISO): 119 U/L (ref 33–130)
BILIRUBIN DIRECT: 0.1 mg/dL (ref 0.0–0.2)
Globulin: 2.7 g/dL (calc) (ref 1.9–3.7)
Indirect Bilirubin: 0.3 mg/dL (calc) (ref 0.2–1.2)
Total Bilirubin: 0.4 mg/dL (ref 0.2–1.2)
Total Protein: 6.3 g/dL (ref 6.1–8.1)

## 2017-08-11 LAB — BASIC METABOLIC PANEL WITH GFR
BUN / CREAT RATIO: 7 (calc) (ref 6–22)
BUN: 70 mg/dL — ABNORMAL HIGH (ref 7–25)
CHLORIDE: 97 mmol/L — AB (ref 98–110)
CO2: 26 mmol/L (ref 20–32)
Calcium: 9.1 mg/dL (ref 8.6–10.4)
Creat: 10.16 mg/dL — ABNORMAL HIGH (ref 0.60–0.93)
GFR, EST AFRICAN AMERICAN: 4 mL/min/{1.73_m2} — AB (ref >=60)
GFR, Est Non African American: 3 mL/min/{1.73_m2} — ABNORMAL LOW (ref >=60)
Glucose, Bld: 117 mg/dL — ABNORMAL HIGH (ref 65–99)
POTASSIUM: 4 mmol/L (ref 3.5–5.3)
Sodium: 139 mmol/L (ref 135–146)

## 2017-08-11 LAB — HEMOGLOBIN A1C
HEMOGLOBIN A1C: 5.4 %{Hb} (ref <5.7)
Mean Plasma Glucose: 108 (calc)
eAG (mmol/L): 6 (calc)

## 2017-08-11 LAB — MAGNESIUM: Magnesium: 1.8 mg/dL (ref 1.5–2.5)

## 2017-08-11 LAB — URIC ACID: Uric Acid, Serum: 3.6 mg/dL (ref 2.5–7.0)

## 2017-08-11 LAB — TSH: TSH: 2.46 m[IU]/L (ref 0.40–4.50)

## 2017-08-11 LAB — VITAMIN D 25 HYDROXY (VIT D DEFICIENCY, FRACTURES): Vit D, 25-Hydroxy: 41 ng/mL (ref 30–100)

## 2017-08-12 DIAGNOSIS — E440 Moderate protein-calorie malnutrition: Secondary | ICD-10-CM | POA: Diagnosis not present

## 2017-08-12 DIAGNOSIS — R8299 Other abnormal findings in urine: Secondary | ICD-10-CM | POA: Diagnosis not present

## 2017-08-12 DIAGNOSIS — N2581 Secondary hyperparathyroidism of renal origin: Secondary | ICD-10-CM | POA: Diagnosis not present

## 2017-08-12 DIAGNOSIS — N186 End stage renal disease: Secondary | ICD-10-CM | POA: Diagnosis not present

## 2017-08-12 DIAGNOSIS — D509 Iron deficiency anemia, unspecified: Secondary | ICD-10-CM | POA: Diagnosis not present

## 2017-08-12 DIAGNOSIS — R17 Unspecified jaundice: Secondary | ICD-10-CM | POA: Diagnosis not present

## 2017-08-12 DIAGNOSIS — Z79899 Other long term (current) drug therapy: Secondary | ICD-10-CM | POA: Diagnosis not present

## 2017-08-12 DIAGNOSIS — D631 Anemia in chronic kidney disease: Secondary | ICD-10-CM | POA: Diagnosis not present

## 2017-08-13 DIAGNOSIS — R8299 Other abnormal findings in urine: Secondary | ICD-10-CM | POA: Diagnosis not present

## 2017-08-13 DIAGNOSIS — N186 End stage renal disease: Secondary | ICD-10-CM | POA: Diagnosis not present

## 2017-08-13 DIAGNOSIS — D631 Anemia in chronic kidney disease: Secondary | ICD-10-CM | POA: Diagnosis not present

## 2017-08-13 DIAGNOSIS — Z79899 Other long term (current) drug therapy: Secondary | ICD-10-CM | POA: Diagnosis not present

## 2017-08-13 DIAGNOSIS — R17 Unspecified jaundice: Secondary | ICD-10-CM | POA: Diagnosis not present

## 2017-08-13 DIAGNOSIS — D509 Iron deficiency anemia, unspecified: Secondary | ICD-10-CM | POA: Diagnosis not present

## 2017-08-13 DIAGNOSIS — N2581 Secondary hyperparathyroidism of renal origin: Secondary | ICD-10-CM | POA: Diagnosis not present

## 2017-08-13 DIAGNOSIS — E440 Moderate protein-calorie malnutrition: Secondary | ICD-10-CM | POA: Diagnosis not present

## 2017-08-14 DIAGNOSIS — D509 Iron deficiency anemia, unspecified: Secondary | ICD-10-CM | POA: Diagnosis not present

## 2017-08-14 DIAGNOSIS — R8299 Other abnormal findings in urine: Secondary | ICD-10-CM | POA: Diagnosis not present

## 2017-08-14 DIAGNOSIS — Z79899 Other long term (current) drug therapy: Secondary | ICD-10-CM | POA: Diagnosis not present

## 2017-08-14 DIAGNOSIS — E440 Moderate protein-calorie malnutrition: Secondary | ICD-10-CM | POA: Diagnosis not present

## 2017-08-14 DIAGNOSIS — N186 End stage renal disease: Secondary | ICD-10-CM | POA: Diagnosis not present

## 2017-08-14 DIAGNOSIS — R17 Unspecified jaundice: Secondary | ICD-10-CM | POA: Diagnosis not present

## 2017-08-14 DIAGNOSIS — D631 Anemia in chronic kidney disease: Secondary | ICD-10-CM | POA: Diagnosis not present

## 2017-08-14 DIAGNOSIS — N2581 Secondary hyperparathyroidism of renal origin: Secondary | ICD-10-CM | POA: Diagnosis not present

## 2017-08-15 DIAGNOSIS — N2581 Secondary hyperparathyroidism of renal origin: Secondary | ICD-10-CM | POA: Diagnosis not present

## 2017-08-15 DIAGNOSIS — R8299 Other abnormal findings in urine: Secondary | ICD-10-CM | POA: Diagnosis not present

## 2017-08-15 DIAGNOSIS — R17 Unspecified jaundice: Secondary | ICD-10-CM | POA: Diagnosis not present

## 2017-08-15 DIAGNOSIS — E440 Moderate protein-calorie malnutrition: Secondary | ICD-10-CM | POA: Diagnosis not present

## 2017-08-15 DIAGNOSIS — D509 Iron deficiency anemia, unspecified: Secondary | ICD-10-CM | POA: Diagnosis not present

## 2017-08-15 DIAGNOSIS — N186 End stage renal disease: Secondary | ICD-10-CM | POA: Diagnosis not present

## 2017-08-15 DIAGNOSIS — Z79899 Other long term (current) drug therapy: Secondary | ICD-10-CM | POA: Diagnosis not present

## 2017-08-15 DIAGNOSIS — D631 Anemia in chronic kidney disease: Secondary | ICD-10-CM | POA: Diagnosis not present

## 2017-08-16 DIAGNOSIS — E440 Moderate protein-calorie malnutrition: Secondary | ICD-10-CM | POA: Diagnosis not present

## 2017-08-16 DIAGNOSIS — R8299 Other abnormal findings in urine: Secondary | ICD-10-CM | POA: Diagnosis not present

## 2017-08-16 DIAGNOSIS — Z79899 Other long term (current) drug therapy: Secondary | ICD-10-CM | POA: Diagnosis not present

## 2017-08-16 DIAGNOSIS — D509 Iron deficiency anemia, unspecified: Secondary | ICD-10-CM | POA: Diagnosis not present

## 2017-08-16 DIAGNOSIS — N2581 Secondary hyperparathyroidism of renal origin: Secondary | ICD-10-CM | POA: Diagnosis not present

## 2017-08-16 DIAGNOSIS — N186 End stage renal disease: Secondary | ICD-10-CM | POA: Diagnosis not present

## 2017-08-16 DIAGNOSIS — D631 Anemia in chronic kidney disease: Secondary | ICD-10-CM | POA: Diagnosis not present

## 2017-08-16 DIAGNOSIS — R17 Unspecified jaundice: Secondary | ICD-10-CM | POA: Diagnosis not present

## 2017-08-17 DIAGNOSIS — Z79899 Other long term (current) drug therapy: Secondary | ICD-10-CM | POA: Diagnosis not present

## 2017-08-17 DIAGNOSIS — D631 Anemia in chronic kidney disease: Secondary | ICD-10-CM | POA: Diagnosis not present

## 2017-08-17 DIAGNOSIS — R17 Unspecified jaundice: Secondary | ICD-10-CM | POA: Diagnosis not present

## 2017-08-17 DIAGNOSIS — D509 Iron deficiency anemia, unspecified: Secondary | ICD-10-CM | POA: Diagnosis not present

## 2017-08-17 DIAGNOSIS — R8299 Other abnormal findings in urine: Secondary | ICD-10-CM | POA: Diagnosis not present

## 2017-08-17 DIAGNOSIS — N186 End stage renal disease: Secondary | ICD-10-CM | POA: Diagnosis not present

## 2017-08-17 DIAGNOSIS — E440 Moderate protein-calorie malnutrition: Secondary | ICD-10-CM | POA: Diagnosis not present

## 2017-08-17 DIAGNOSIS — N2581 Secondary hyperparathyroidism of renal origin: Secondary | ICD-10-CM | POA: Diagnosis not present

## 2017-08-18 DIAGNOSIS — R17 Unspecified jaundice: Secondary | ICD-10-CM | POA: Diagnosis not present

## 2017-08-18 DIAGNOSIS — D631 Anemia in chronic kidney disease: Secondary | ICD-10-CM | POA: Diagnosis not present

## 2017-08-18 DIAGNOSIS — Z79899 Other long term (current) drug therapy: Secondary | ICD-10-CM | POA: Diagnosis not present

## 2017-08-18 DIAGNOSIS — N2581 Secondary hyperparathyroidism of renal origin: Secondary | ICD-10-CM | POA: Diagnosis not present

## 2017-08-18 DIAGNOSIS — N186 End stage renal disease: Secondary | ICD-10-CM | POA: Diagnosis not present

## 2017-08-18 DIAGNOSIS — E440 Moderate protein-calorie malnutrition: Secondary | ICD-10-CM | POA: Diagnosis not present

## 2017-08-18 DIAGNOSIS — D509 Iron deficiency anemia, unspecified: Secondary | ICD-10-CM | POA: Diagnosis not present

## 2017-08-18 DIAGNOSIS — R8299 Other abnormal findings in urine: Secondary | ICD-10-CM | POA: Diagnosis not present

## 2017-08-19 DIAGNOSIS — Z992 Dependence on renal dialysis: Secondary | ICD-10-CM | POA: Diagnosis not present

## 2017-08-19 DIAGNOSIS — D509 Iron deficiency anemia, unspecified: Secondary | ICD-10-CM | POA: Diagnosis not present

## 2017-08-19 DIAGNOSIS — E1122 Type 2 diabetes mellitus with diabetic chronic kidney disease: Secondary | ICD-10-CM | POA: Diagnosis not present

## 2017-08-19 DIAGNOSIS — N186 End stage renal disease: Secondary | ICD-10-CM | POA: Diagnosis not present

## 2017-08-19 DIAGNOSIS — D631 Anemia in chronic kidney disease: Secondary | ICD-10-CM | POA: Diagnosis not present

## 2017-08-19 DIAGNOSIS — R17 Unspecified jaundice: Secondary | ICD-10-CM | POA: Diagnosis not present

## 2017-08-19 DIAGNOSIS — Z79899 Other long term (current) drug therapy: Secondary | ICD-10-CM | POA: Diagnosis not present

## 2017-08-19 DIAGNOSIS — R8299 Other abnormal findings in urine: Secondary | ICD-10-CM | POA: Diagnosis not present

## 2017-08-19 DIAGNOSIS — N2581 Secondary hyperparathyroidism of renal origin: Secondary | ICD-10-CM | POA: Diagnosis not present

## 2017-08-19 DIAGNOSIS — E440 Moderate protein-calorie malnutrition: Secondary | ICD-10-CM | POA: Diagnosis not present

## 2017-08-20 DIAGNOSIS — E7849 Other hyperlipidemia: Secondary | ICD-10-CM | POA: Diagnosis not present

## 2017-08-20 DIAGNOSIS — N2581 Secondary hyperparathyroidism of renal origin: Secondary | ICD-10-CM | POA: Diagnosis not present

## 2017-08-20 DIAGNOSIS — D631 Anemia in chronic kidney disease: Secondary | ICD-10-CM | POA: Diagnosis not present

## 2017-08-20 DIAGNOSIS — Z4932 Encounter for adequacy testing for peritoneal dialysis: Secondary | ICD-10-CM | POA: Diagnosis not present

## 2017-08-20 DIAGNOSIS — E119 Type 2 diabetes mellitus without complications: Secondary | ICD-10-CM | POA: Diagnosis not present

## 2017-08-20 DIAGNOSIS — D509 Iron deficiency anemia, unspecified: Secondary | ICD-10-CM | POA: Diagnosis not present

## 2017-08-20 DIAGNOSIS — N2589 Other disorders resulting from impaired renal tubular function: Secondary | ICD-10-CM | POA: Diagnosis not present

## 2017-08-20 DIAGNOSIS — N186 End stage renal disease: Secondary | ICD-10-CM | POA: Diagnosis not present

## 2017-08-21 DIAGNOSIS — D509 Iron deficiency anemia, unspecified: Secondary | ICD-10-CM | POA: Diagnosis not present

## 2017-08-21 DIAGNOSIS — E119 Type 2 diabetes mellitus without complications: Secondary | ICD-10-CM | POA: Diagnosis not present

## 2017-08-21 DIAGNOSIS — N2589 Other disorders resulting from impaired renal tubular function: Secondary | ICD-10-CM | POA: Diagnosis not present

## 2017-08-21 DIAGNOSIS — D631 Anemia in chronic kidney disease: Secondary | ICD-10-CM | POA: Diagnosis not present

## 2017-08-21 DIAGNOSIS — E7849 Other hyperlipidemia: Secondary | ICD-10-CM | POA: Diagnosis not present

## 2017-08-21 DIAGNOSIS — N186 End stage renal disease: Secondary | ICD-10-CM | POA: Diagnosis not present

## 2017-08-21 DIAGNOSIS — N2581 Secondary hyperparathyroidism of renal origin: Secondary | ICD-10-CM | POA: Diagnosis not present

## 2017-08-21 DIAGNOSIS — Z4932 Encounter for adequacy testing for peritoneal dialysis: Secondary | ICD-10-CM | POA: Diagnosis not present

## 2017-08-22 DIAGNOSIS — Z4932 Encounter for adequacy testing for peritoneal dialysis: Secondary | ICD-10-CM | POA: Diagnosis not present

## 2017-08-22 DIAGNOSIS — N186 End stage renal disease: Secondary | ICD-10-CM | POA: Diagnosis not present

## 2017-08-22 DIAGNOSIS — D631 Anemia in chronic kidney disease: Secondary | ICD-10-CM | POA: Diagnosis not present

## 2017-08-22 DIAGNOSIS — E119 Type 2 diabetes mellitus without complications: Secondary | ICD-10-CM | POA: Diagnosis not present

## 2017-08-22 DIAGNOSIS — E7849 Other hyperlipidemia: Secondary | ICD-10-CM | POA: Diagnosis not present

## 2017-08-22 DIAGNOSIS — D509 Iron deficiency anemia, unspecified: Secondary | ICD-10-CM | POA: Diagnosis not present

## 2017-08-22 DIAGNOSIS — N2589 Other disorders resulting from impaired renal tubular function: Secondary | ICD-10-CM | POA: Diagnosis not present

## 2017-08-22 DIAGNOSIS — N2581 Secondary hyperparathyroidism of renal origin: Secondary | ICD-10-CM | POA: Diagnosis not present

## 2017-08-23 DIAGNOSIS — N2581 Secondary hyperparathyroidism of renal origin: Secondary | ICD-10-CM | POA: Diagnosis not present

## 2017-08-23 DIAGNOSIS — Z4932 Encounter for adequacy testing for peritoneal dialysis: Secondary | ICD-10-CM | POA: Diagnosis not present

## 2017-08-23 DIAGNOSIS — N2589 Other disorders resulting from impaired renal tubular function: Secondary | ICD-10-CM | POA: Diagnosis not present

## 2017-08-23 DIAGNOSIS — E119 Type 2 diabetes mellitus without complications: Secondary | ICD-10-CM | POA: Diagnosis not present

## 2017-08-23 DIAGNOSIS — D631 Anemia in chronic kidney disease: Secondary | ICD-10-CM | POA: Diagnosis not present

## 2017-08-23 DIAGNOSIS — D509 Iron deficiency anemia, unspecified: Secondary | ICD-10-CM | POA: Diagnosis not present

## 2017-08-23 DIAGNOSIS — N186 End stage renal disease: Secondary | ICD-10-CM | POA: Diagnosis not present

## 2017-08-23 DIAGNOSIS — E7849 Other hyperlipidemia: Secondary | ICD-10-CM | POA: Diagnosis not present

## 2017-08-24 DIAGNOSIS — N186 End stage renal disease: Secondary | ICD-10-CM | POA: Diagnosis not present

## 2017-08-24 DIAGNOSIS — D509 Iron deficiency anemia, unspecified: Secondary | ICD-10-CM | POA: Diagnosis not present

## 2017-08-24 DIAGNOSIS — D631 Anemia in chronic kidney disease: Secondary | ICD-10-CM | POA: Diagnosis not present

## 2017-08-24 DIAGNOSIS — N2589 Other disorders resulting from impaired renal tubular function: Secondary | ICD-10-CM | POA: Diagnosis not present

## 2017-08-24 DIAGNOSIS — E7849 Other hyperlipidemia: Secondary | ICD-10-CM | POA: Diagnosis not present

## 2017-08-24 DIAGNOSIS — Z4932 Encounter for adequacy testing for peritoneal dialysis: Secondary | ICD-10-CM | POA: Diagnosis not present

## 2017-08-24 DIAGNOSIS — N2581 Secondary hyperparathyroidism of renal origin: Secondary | ICD-10-CM | POA: Diagnosis not present

## 2017-08-24 DIAGNOSIS — E119 Type 2 diabetes mellitus without complications: Secondary | ICD-10-CM | POA: Diagnosis not present

## 2017-08-25 DIAGNOSIS — E7849 Other hyperlipidemia: Secondary | ICD-10-CM | POA: Diagnosis not present

## 2017-08-25 DIAGNOSIS — E119 Type 2 diabetes mellitus without complications: Secondary | ICD-10-CM | POA: Diagnosis not present

## 2017-08-25 DIAGNOSIS — D509 Iron deficiency anemia, unspecified: Secondary | ICD-10-CM | POA: Diagnosis not present

## 2017-08-25 DIAGNOSIS — D631 Anemia in chronic kidney disease: Secondary | ICD-10-CM | POA: Diagnosis not present

## 2017-08-25 DIAGNOSIS — N186 End stage renal disease: Secondary | ICD-10-CM | POA: Diagnosis not present

## 2017-08-25 DIAGNOSIS — N2581 Secondary hyperparathyroidism of renal origin: Secondary | ICD-10-CM | POA: Diagnosis not present

## 2017-08-25 DIAGNOSIS — N2589 Other disorders resulting from impaired renal tubular function: Secondary | ICD-10-CM | POA: Diagnosis not present

## 2017-08-25 DIAGNOSIS — Z4932 Encounter for adequacy testing for peritoneal dialysis: Secondary | ICD-10-CM | POA: Diagnosis not present

## 2017-08-26 DIAGNOSIS — N2581 Secondary hyperparathyroidism of renal origin: Secondary | ICD-10-CM | POA: Diagnosis not present

## 2017-08-26 DIAGNOSIS — E119 Type 2 diabetes mellitus without complications: Secondary | ICD-10-CM | POA: Diagnosis not present

## 2017-08-26 DIAGNOSIS — D631 Anemia in chronic kidney disease: Secondary | ICD-10-CM | POA: Diagnosis not present

## 2017-08-26 DIAGNOSIS — Z4932 Encounter for adequacy testing for peritoneal dialysis: Secondary | ICD-10-CM | POA: Diagnosis not present

## 2017-08-26 DIAGNOSIS — N186 End stage renal disease: Secondary | ICD-10-CM | POA: Diagnosis not present

## 2017-08-26 DIAGNOSIS — N2589 Other disorders resulting from impaired renal tubular function: Secondary | ICD-10-CM | POA: Diagnosis not present

## 2017-08-26 DIAGNOSIS — D509 Iron deficiency anemia, unspecified: Secondary | ICD-10-CM | POA: Diagnosis not present

## 2017-08-26 DIAGNOSIS — E7849 Other hyperlipidemia: Secondary | ICD-10-CM | POA: Diagnosis not present

## 2017-08-27 DIAGNOSIS — Z4932 Encounter for adequacy testing for peritoneal dialysis: Secondary | ICD-10-CM | POA: Diagnosis not present

## 2017-08-27 DIAGNOSIS — N2589 Other disorders resulting from impaired renal tubular function: Secondary | ICD-10-CM | POA: Diagnosis not present

## 2017-08-27 DIAGNOSIS — E7849 Other hyperlipidemia: Secondary | ICD-10-CM | POA: Diagnosis not present

## 2017-08-27 DIAGNOSIS — D631 Anemia in chronic kidney disease: Secondary | ICD-10-CM | POA: Diagnosis not present

## 2017-08-27 DIAGNOSIS — E119 Type 2 diabetes mellitus without complications: Secondary | ICD-10-CM | POA: Diagnosis not present

## 2017-08-27 DIAGNOSIS — N2581 Secondary hyperparathyroidism of renal origin: Secondary | ICD-10-CM | POA: Diagnosis not present

## 2017-08-27 DIAGNOSIS — N186 End stage renal disease: Secondary | ICD-10-CM | POA: Diagnosis not present

## 2017-08-27 DIAGNOSIS — D509 Iron deficiency anemia, unspecified: Secondary | ICD-10-CM | POA: Diagnosis not present

## 2017-08-28 DIAGNOSIS — N2589 Other disorders resulting from impaired renal tubular function: Secondary | ICD-10-CM | POA: Diagnosis not present

## 2017-08-28 DIAGNOSIS — E7849 Other hyperlipidemia: Secondary | ICD-10-CM | POA: Diagnosis not present

## 2017-08-28 DIAGNOSIS — Z4932 Encounter for adequacy testing for peritoneal dialysis: Secondary | ICD-10-CM | POA: Diagnosis not present

## 2017-08-28 DIAGNOSIS — N2581 Secondary hyperparathyroidism of renal origin: Secondary | ICD-10-CM | POA: Diagnosis not present

## 2017-08-28 DIAGNOSIS — E119 Type 2 diabetes mellitus without complications: Secondary | ICD-10-CM | POA: Diagnosis not present

## 2017-08-28 DIAGNOSIS — D509 Iron deficiency anemia, unspecified: Secondary | ICD-10-CM | POA: Diagnosis not present

## 2017-08-28 DIAGNOSIS — N186 End stage renal disease: Secondary | ICD-10-CM | POA: Diagnosis not present

## 2017-08-28 DIAGNOSIS — D631 Anemia in chronic kidney disease: Secondary | ICD-10-CM | POA: Diagnosis not present

## 2017-08-29 DIAGNOSIS — E119 Type 2 diabetes mellitus without complications: Secondary | ICD-10-CM | POA: Diagnosis not present

## 2017-08-29 DIAGNOSIS — D509 Iron deficiency anemia, unspecified: Secondary | ICD-10-CM | POA: Diagnosis not present

## 2017-08-29 DIAGNOSIS — D631 Anemia in chronic kidney disease: Secondary | ICD-10-CM | POA: Diagnosis not present

## 2017-08-29 DIAGNOSIS — N2581 Secondary hyperparathyroidism of renal origin: Secondary | ICD-10-CM | POA: Diagnosis not present

## 2017-08-29 DIAGNOSIS — Z4932 Encounter for adequacy testing for peritoneal dialysis: Secondary | ICD-10-CM | POA: Diagnosis not present

## 2017-08-29 DIAGNOSIS — N186 End stage renal disease: Secondary | ICD-10-CM | POA: Diagnosis not present

## 2017-08-29 DIAGNOSIS — N2589 Other disorders resulting from impaired renal tubular function: Secondary | ICD-10-CM | POA: Diagnosis not present

## 2017-08-29 DIAGNOSIS — E7849 Other hyperlipidemia: Secondary | ICD-10-CM | POA: Diagnosis not present

## 2017-08-30 DIAGNOSIS — N186 End stage renal disease: Secondary | ICD-10-CM | POA: Diagnosis not present

## 2017-08-30 DIAGNOSIS — Z4932 Encounter for adequacy testing for peritoneal dialysis: Secondary | ICD-10-CM | POA: Diagnosis not present

## 2017-08-30 DIAGNOSIS — N2581 Secondary hyperparathyroidism of renal origin: Secondary | ICD-10-CM | POA: Diagnosis not present

## 2017-08-30 DIAGNOSIS — E119 Type 2 diabetes mellitus without complications: Secondary | ICD-10-CM | POA: Diagnosis not present

## 2017-08-30 DIAGNOSIS — D509 Iron deficiency anemia, unspecified: Secondary | ICD-10-CM | POA: Diagnosis not present

## 2017-08-30 DIAGNOSIS — E7849 Other hyperlipidemia: Secondary | ICD-10-CM | POA: Diagnosis not present

## 2017-08-30 DIAGNOSIS — D631 Anemia in chronic kidney disease: Secondary | ICD-10-CM | POA: Diagnosis not present

## 2017-08-30 DIAGNOSIS — N2589 Other disorders resulting from impaired renal tubular function: Secondary | ICD-10-CM | POA: Diagnosis not present

## 2017-08-31 ENCOUNTER — Ambulatory Visit: Payer: Self-pay | Admitting: Physician Assistant

## 2017-08-31 ENCOUNTER — Other Ambulatory Visit: Payer: Self-pay | Admitting: Internal Medicine

## 2017-08-31 DIAGNOSIS — N2581 Secondary hyperparathyroidism of renal origin: Secondary | ICD-10-CM | POA: Diagnosis not present

## 2017-08-31 DIAGNOSIS — N186 End stage renal disease: Secondary | ICD-10-CM | POA: Diagnosis not present

## 2017-08-31 DIAGNOSIS — D509 Iron deficiency anemia, unspecified: Secondary | ICD-10-CM | POA: Diagnosis not present

## 2017-08-31 DIAGNOSIS — E7849 Other hyperlipidemia: Secondary | ICD-10-CM | POA: Diagnosis not present

## 2017-08-31 DIAGNOSIS — E119 Type 2 diabetes mellitus without complications: Secondary | ICD-10-CM | POA: Diagnosis not present

## 2017-08-31 DIAGNOSIS — D631 Anemia in chronic kidney disease: Secondary | ICD-10-CM | POA: Diagnosis not present

## 2017-08-31 DIAGNOSIS — N2589 Other disorders resulting from impaired renal tubular function: Secondary | ICD-10-CM | POA: Diagnosis not present

## 2017-08-31 DIAGNOSIS — Z4932 Encounter for adequacy testing for peritoneal dialysis: Secondary | ICD-10-CM | POA: Diagnosis not present

## 2017-09-01 DIAGNOSIS — D631 Anemia in chronic kidney disease: Secondary | ICD-10-CM | POA: Diagnosis not present

## 2017-09-01 DIAGNOSIS — E7849 Other hyperlipidemia: Secondary | ICD-10-CM | POA: Diagnosis not present

## 2017-09-01 DIAGNOSIS — N186 End stage renal disease: Secondary | ICD-10-CM | POA: Diagnosis not present

## 2017-09-01 DIAGNOSIS — Z4932 Encounter for adequacy testing for peritoneal dialysis: Secondary | ICD-10-CM | POA: Diagnosis not present

## 2017-09-01 DIAGNOSIS — N2581 Secondary hyperparathyroidism of renal origin: Secondary | ICD-10-CM | POA: Diagnosis not present

## 2017-09-01 DIAGNOSIS — E119 Type 2 diabetes mellitus without complications: Secondary | ICD-10-CM | POA: Diagnosis not present

## 2017-09-01 DIAGNOSIS — D509 Iron deficiency anemia, unspecified: Secondary | ICD-10-CM | POA: Diagnosis not present

## 2017-09-01 DIAGNOSIS — N2589 Other disorders resulting from impaired renal tubular function: Secondary | ICD-10-CM | POA: Diagnosis not present

## 2017-09-02 DIAGNOSIS — E7849 Other hyperlipidemia: Secondary | ICD-10-CM | POA: Diagnosis not present

## 2017-09-02 DIAGNOSIS — N2581 Secondary hyperparathyroidism of renal origin: Secondary | ICD-10-CM | POA: Diagnosis not present

## 2017-09-02 DIAGNOSIS — N186 End stage renal disease: Secondary | ICD-10-CM | POA: Diagnosis not present

## 2017-09-02 DIAGNOSIS — Z4932 Encounter for adequacy testing for peritoneal dialysis: Secondary | ICD-10-CM | POA: Diagnosis not present

## 2017-09-02 DIAGNOSIS — D631 Anemia in chronic kidney disease: Secondary | ICD-10-CM | POA: Diagnosis not present

## 2017-09-02 DIAGNOSIS — N2589 Other disorders resulting from impaired renal tubular function: Secondary | ICD-10-CM | POA: Diagnosis not present

## 2017-09-02 DIAGNOSIS — E119 Type 2 diabetes mellitus without complications: Secondary | ICD-10-CM | POA: Diagnosis not present

## 2017-09-02 DIAGNOSIS — D509 Iron deficiency anemia, unspecified: Secondary | ICD-10-CM | POA: Diagnosis not present

## 2017-09-03 ENCOUNTER — Other Ambulatory Visit: Payer: Self-pay | Admitting: Physician Assistant

## 2017-09-03 DIAGNOSIS — D631 Anemia in chronic kidney disease: Secondary | ICD-10-CM | POA: Diagnosis not present

## 2017-09-03 DIAGNOSIS — N186 End stage renal disease: Secondary | ICD-10-CM | POA: Diagnosis not present

## 2017-09-03 DIAGNOSIS — Z4932 Encounter for adequacy testing for peritoneal dialysis: Secondary | ICD-10-CM | POA: Diagnosis not present

## 2017-09-03 DIAGNOSIS — E7849 Other hyperlipidemia: Secondary | ICD-10-CM | POA: Diagnosis not present

## 2017-09-03 DIAGNOSIS — N2581 Secondary hyperparathyroidism of renal origin: Secondary | ICD-10-CM | POA: Diagnosis not present

## 2017-09-03 DIAGNOSIS — D509 Iron deficiency anemia, unspecified: Secondary | ICD-10-CM | POA: Diagnosis not present

## 2017-09-03 DIAGNOSIS — N2589 Other disorders resulting from impaired renal tubular function: Secondary | ICD-10-CM | POA: Diagnosis not present

## 2017-09-03 DIAGNOSIS — E119 Type 2 diabetes mellitus without complications: Secondary | ICD-10-CM | POA: Diagnosis not present

## 2017-09-04 DIAGNOSIS — N2589 Other disorders resulting from impaired renal tubular function: Secondary | ICD-10-CM | POA: Diagnosis not present

## 2017-09-04 DIAGNOSIS — E7849 Other hyperlipidemia: Secondary | ICD-10-CM | POA: Diagnosis not present

## 2017-09-04 DIAGNOSIS — D509 Iron deficiency anemia, unspecified: Secondary | ICD-10-CM | POA: Diagnosis not present

## 2017-09-04 DIAGNOSIS — Z4932 Encounter for adequacy testing for peritoneal dialysis: Secondary | ICD-10-CM | POA: Diagnosis not present

## 2017-09-04 DIAGNOSIS — N2581 Secondary hyperparathyroidism of renal origin: Secondary | ICD-10-CM | POA: Diagnosis not present

## 2017-09-04 DIAGNOSIS — D631 Anemia in chronic kidney disease: Secondary | ICD-10-CM | POA: Diagnosis not present

## 2017-09-04 DIAGNOSIS — E119 Type 2 diabetes mellitus without complications: Secondary | ICD-10-CM | POA: Diagnosis not present

## 2017-09-04 DIAGNOSIS — N186 End stage renal disease: Secondary | ICD-10-CM | POA: Diagnosis not present

## 2017-09-05 ENCOUNTER — Encounter: Payer: Self-pay | Admitting: Internal Medicine

## 2017-09-05 DIAGNOSIS — N2581 Secondary hyperparathyroidism of renal origin: Secondary | ICD-10-CM | POA: Diagnosis not present

## 2017-09-05 DIAGNOSIS — N186 End stage renal disease: Secondary | ICD-10-CM | POA: Diagnosis not present

## 2017-09-05 DIAGNOSIS — D631 Anemia in chronic kidney disease: Secondary | ICD-10-CM | POA: Diagnosis not present

## 2017-09-05 DIAGNOSIS — E7849 Other hyperlipidemia: Secondary | ICD-10-CM | POA: Diagnosis not present

## 2017-09-05 DIAGNOSIS — N2589 Other disorders resulting from impaired renal tubular function: Secondary | ICD-10-CM | POA: Diagnosis not present

## 2017-09-05 DIAGNOSIS — E119 Type 2 diabetes mellitus without complications: Secondary | ICD-10-CM | POA: Diagnosis not present

## 2017-09-05 DIAGNOSIS — D509 Iron deficiency anemia, unspecified: Secondary | ICD-10-CM | POA: Diagnosis not present

## 2017-09-05 DIAGNOSIS — Z4932 Encounter for adequacy testing for peritoneal dialysis: Secondary | ICD-10-CM | POA: Diagnosis not present

## 2017-09-06 DIAGNOSIS — E119 Type 2 diabetes mellitus without complications: Secondary | ICD-10-CM | POA: Diagnosis not present

## 2017-09-06 DIAGNOSIS — E7849 Other hyperlipidemia: Secondary | ICD-10-CM | POA: Diagnosis not present

## 2017-09-06 DIAGNOSIS — D631 Anemia in chronic kidney disease: Secondary | ICD-10-CM | POA: Diagnosis not present

## 2017-09-06 DIAGNOSIS — N2581 Secondary hyperparathyroidism of renal origin: Secondary | ICD-10-CM | POA: Diagnosis not present

## 2017-09-06 DIAGNOSIS — D509 Iron deficiency anemia, unspecified: Secondary | ICD-10-CM | POA: Diagnosis not present

## 2017-09-06 DIAGNOSIS — N186 End stage renal disease: Secondary | ICD-10-CM | POA: Diagnosis not present

## 2017-09-06 DIAGNOSIS — Z4932 Encounter for adequacy testing for peritoneal dialysis: Secondary | ICD-10-CM | POA: Diagnosis not present

## 2017-09-06 DIAGNOSIS — N2589 Other disorders resulting from impaired renal tubular function: Secondary | ICD-10-CM | POA: Diagnosis not present

## 2017-09-07 DIAGNOSIS — D631 Anemia in chronic kidney disease: Secondary | ICD-10-CM | POA: Diagnosis not present

## 2017-09-07 DIAGNOSIS — Z4932 Encounter for adequacy testing for peritoneal dialysis: Secondary | ICD-10-CM | POA: Diagnosis not present

## 2017-09-07 DIAGNOSIS — E119 Type 2 diabetes mellitus without complications: Secondary | ICD-10-CM | POA: Diagnosis not present

## 2017-09-07 DIAGNOSIS — N186 End stage renal disease: Secondary | ICD-10-CM | POA: Diagnosis not present

## 2017-09-07 DIAGNOSIS — N2581 Secondary hyperparathyroidism of renal origin: Secondary | ICD-10-CM | POA: Diagnosis not present

## 2017-09-07 DIAGNOSIS — D509 Iron deficiency anemia, unspecified: Secondary | ICD-10-CM | POA: Diagnosis not present

## 2017-09-07 DIAGNOSIS — E7849 Other hyperlipidemia: Secondary | ICD-10-CM | POA: Diagnosis not present

## 2017-09-07 DIAGNOSIS — N2589 Other disorders resulting from impaired renal tubular function: Secondary | ICD-10-CM | POA: Diagnosis not present

## 2017-09-08 DIAGNOSIS — N2581 Secondary hyperparathyroidism of renal origin: Secondary | ICD-10-CM | POA: Diagnosis not present

## 2017-09-08 DIAGNOSIS — N186 End stage renal disease: Secondary | ICD-10-CM | POA: Diagnosis not present

## 2017-09-08 DIAGNOSIS — E119 Type 2 diabetes mellitus without complications: Secondary | ICD-10-CM | POA: Diagnosis not present

## 2017-09-08 DIAGNOSIS — E7849 Other hyperlipidemia: Secondary | ICD-10-CM | POA: Diagnosis not present

## 2017-09-08 DIAGNOSIS — D509 Iron deficiency anemia, unspecified: Secondary | ICD-10-CM | POA: Diagnosis not present

## 2017-09-08 DIAGNOSIS — D631 Anemia in chronic kidney disease: Secondary | ICD-10-CM | POA: Diagnosis not present

## 2017-09-08 DIAGNOSIS — N2589 Other disorders resulting from impaired renal tubular function: Secondary | ICD-10-CM | POA: Diagnosis not present

## 2017-09-08 DIAGNOSIS — Z4932 Encounter for adequacy testing for peritoneal dialysis: Secondary | ICD-10-CM | POA: Diagnosis not present

## 2017-09-09 DIAGNOSIS — D509 Iron deficiency anemia, unspecified: Secondary | ICD-10-CM | POA: Diagnosis not present

## 2017-09-09 DIAGNOSIS — D631 Anemia in chronic kidney disease: Secondary | ICD-10-CM | POA: Diagnosis not present

## 2017-09-09 DIAGNOSIS — Z4932 Encounter for adequacy testing for peritoneal dialysis: Secondary | ICD-10-CM | POA: Diagnosis not present

## 2017-09-09 DIAGNOSIS — E7849 Other hyperlipidemia: Secondary | ICD-10-CM | POA: Diagnosis not present

## 2017-09-09 DIAGNOSIS — N2581 Secondary hyperparathyroidism of renal origin: Secondary | ICD-10-CM | POA: Diagnosis not present

## 2017-09-09 DIAGNOSIS — N2589 Other disorders resulting from impaired renal tubular function: Secondary | ICD-10-CM | POA: Diagnosis not present

## 2017-09-09 DIAGNOSIS — N186 End stage renal disease: Secondary | ICD-10-CM | POA: Diagnosis not present

## 2017-09-09 DIAGNOSIS — E119 Type 2 diabetes mellitus without complications: Secondary | ICD-10-CM | POA: Diagnosis not present

## 2017-09-10 DIAGNOSIS — E119 Type 2 diabetes mellitus without complications: Secondary | ICD-10-CM | POA: Diagnosis not present

## 2017-09-10 DIAGNOSIS — N2581 Secondary hyperparathyroidism of renal origin: Secondary | ICD-10-CM | POA: Diagnosis not present

## 2017-09-10 DIAGNOSIS — Z4932 Encounter for adequacy testing for peritoneal dialysis: Secondary | ICD-10-CM | POA: Diagnosis not present

## 2017-09-10 DIAGNOSIS — N2589 Other disorders resulting from impaired renal tubular function: Secondary | ICD-10-CM | POA: Diagnosis not present

## 2017-09-10 DIAGNOSIS — N186 End stage renal disease: Secondary | ICD-10-CM | POA: Diagnosis not present

## 2017-09-10 DIAGNOSIS — D631 Anemia in chronic kidney disease: Secondary | ICD-10-CM | POA: Diagnosis not present

## 2017-09-10 DIAGNOSIS — E7849 Other hyperlipidemia: Secondary | ICD-10-CM | POA: Diagnosis not present

## 2017-09-10 DIAGNOSIS — D509 Iron deficiency anemia, unspecified: Secondary | ICD-10-CM | POA: Diagnosis not present

## 2017-09-11 DIAGNOSIS — E7849 Other hyperlipidemia: Secondary | ICD-10-CM | POA: Diagnosis not present

## 2017-09-11 DIAGNOSIS — N186 End stage renal disease: Secondary | ICD-10-CM | POA: Diagnosis not present

## 2017-09-11 DIAGNOSIS — D509 Iron deficiency anemia, unspecified: Secondary | ICD-10-CM | POA: Diagnosis not present

## 2017-09-11 DIAGNOSIS — E119 Type 2 diabetes mellitus without complications: Secondary | ICD-10-CM | POA: Diagnosis not present

## 2017-09-11 DIAGNOSIS — N2589 Other disorders resulting from impaired renal tubular function: Secondary | ICD-10-CM | POA: Diagnosis not present

## 2017-09-11 DIAGNOSIS — Z4932 Encounter for adequacy testing for peritoneal dialysis: Secondary | ICD-10-CM | POA: Diagnosis not present

## 2017-09-11 DIAGNOSIS — D631 Anemia in chronic kidney disease: Secondary | ICD-10-CM | POA: Diagnosis not present

## 2017-09-11 DIAGNOSIS — N2581 Secondary hyperparathyroidism of renal origin: Secondary | ICD-10-CM | POA: Diagnosis not present

## 2017-09-12 DIAGNOSIS — D631 Anemia in chronic kidney disease: Secondary | ICD-10-CM | POA: Diagnosis not present

## 2017-09-12 DIAGNOSIS — E119 Type 2 diabetes mellitus without complications: Secondary | ICD-10-CM | POA: Diagnosis not present

## 2017-09-12 DIAGNOSIS — D509 Iron deficiency anemia, unspecified: Secondary | ICD-10-CM | POA: Diagnosis not present

## 2017-09-12 DIAGNOSIS — N2589 Other disorders resulting from impaired renal tubular function: Secondary | ICD-10-CM | POA: Diagnosis not present

## 2017-09-12 DIAGNOSIS — Z4932 Encounter for adequacy testing for peritoneal dialysis: Secondary | ICD-10-CM | POA: Diagnosis not present

## 2017-09-12 DIAGNOSIS — N186 End stage renal disease: Secondary | ICD-10-CM | POA: Diagnosis not present

## 2017-09-12 DIAGNOSIS — E7849 Other hyperlipidemia: Secondary | ICD-10-CM | POA: Diagnosis not present

## 2017-09-12 DIAGNOSIS — N2581 Secondary hyperparathyroidism of renal origin: Secondary | ICD-10-CM | POA: Diagnosis not present

## 2017-09-13 DIAGNOSIS — N2589 Other disorders resulting from impaired renal tubular function: Secondary | ICD-10-CM | POA: Diagnosis not present

## 2017-09-13 DIAGNOSIS — D631 Anemia in chronic kidney disease: Secondary | ICD-10-CM | POA: Diagnosis not present

## 2017-09-13 DIAGNOSIS — N2581 Secondary hyperparathyroidism of renal origin: Secondary | ICD-10-CM | POA: Diagnosis not present

## 2017-09-13 DIAGNOSIS — Z4932 Encounter for adequacy testing for peritoneal dialysis: Secondary | ICD-10-CM | POA: Diagnosis not present

## 2017-09-13 DIAGNOSIS — D509 Iron deficiency anemia, unspecified: Secondary | ICD-10-CM | POA: Diagnosis not present

## 2017-09-13 DIAGNOSIS — E7849 Other hyperlipidemia: Secondary | ICD-10-CM | POA: Diagnosis not present

## 2017-09-13 DIAGNOSIS — E119 Type 2 diabetes mellitus without complications: Secondary | ICD-10-CM | POA: Diagnosis not present

## 2017-09-13 DIAGNOSIS — N186 End stage renal disease: Secondary | ICD-10-CM | POA: Diagnosis not present

## 2017-09-14 DIAGNOSIS — Z4932 Encounter for adequacy testing for peritoneal dialysis: Secondary | ICD-10-CM | POA: Diagnosis not present

## 2017-09-14 DIAGNOSIS — N2589 Other disorders resulting from impaired renal tubular function: Secondary | ICD-10-CM | POA: Diagnosis not present

## 2017-09-14 DIAGNOSIS — N2581 Secondary hyperparathyroidism of renal origin: Secondary | ICD-10-CM | POA: Diagnosis not present

## 2017-09-14 DIAGNOSIS — E7849 Other hyperlipidemia: Secondary | ICD-10-CM | POA: Diagnosis not present

## 2017-09-14 DIAGNOSIS — D509 Iron deficiency anemia, unspecified: Secondary | ICD-10-CM | POA: Diagnosis not present

## 2017-09-14 DIAGNOSIS — N186 End stage renal disease: Secondary | ICD-10-CM | POA: Diagnosis not present

## 2017-09-14 DIAGNOSIS — E119 Type 2 diabetes mellitus without complications: Secondary | ICD-10-CM | POA: Diagnosis not present

## 2017-09-14 DIAGNOSIS — D631 Anemia in chronic kidney disease: Secondary | ICD-10-CM | POA: Diagnosis not present

## 2017-09-15 DIAGNOSIS — N2581 Secondary hyperparathyroidism of renal origin: Secondary | ICD-10-CM | POA: Diagnosis not present

## 2017-09-15 DIAGNOSIS — E119 Type 2 diabetes mellitus without complications: Secondary | ICD-10-CM | POA: Diagnosis not present

## 2017-09-15 DIAGNOSIS — N186 End stage renal disease: Secondary | ICD-10-CM | POA: Diagnosis not present

## 2017-09-15 DIAGNOSIS — N2589 Other disorders resulting from impaired renal tubular function: Secondary | ICD-10-CM | POA: Diagnosis not present

## 2017-09-15 DIAGNOSIS — D509 Iron deficiency anemia, unspecified: Secondary | ICD-10-CM | POA: Diagnosis not present

## 2017-09-15 DIAGNOSIS — D631 Anemia in chronic kidney disease: Secondary | ICD-10-CM | POA: Diagnosis not present

## 2017-09-15 DIAGNOSIS — Z4932 Encounter for adequacy testing for peritoneal dialysis: Secondary | ICD-10-CM | POA: Diagnosis not present

## 2017-09-15 DIAGNOSIS — E7849 Other hyperlipidemia: Secondary | ICD-10-CM | POA: Diagnosis not present

## 2017-09-16 DIAGNOSIS — Z4932 Encounter for adequacy testing for peritoneal dialysis: Secondary | ICD-10-CM | POA: Diagnosis not present

## 2017-09-16 DIAGNOSIS — E7849 Other hyperlipidemia: Secondary | ICD-10-CM | POA: Diagnosis not present

## 2017-09-16 DIAGNOSIS — D631 Anemia in chronic kidney disease: Secondary | ICD-10-CM | POA: Diagnosis not present

## 2017-09-16 DIAGNOSIS — N2589 Other disorders resulting from impaired renal tubular function: Secondary | ICD-10-CM | POA: Diagnosis not present

## 2017-09-16 DIAGNOSIS — N186 End stage renal disease: Secondary | ICD-10-CM | POA: Diagnosis not present

## 2017-09-16 DIAGNOSIS — N2581 Secondary hyperparathyroidism of renal origin: Secondary | ICD-10-CM | POA: Diagnosis not present

## 2017-09-16 DIAGNOSIS — D509 Iron deficiency anemia, unspecified: Secondary | ICD-10-CM | POA: Diagnosis not present

## 2017-09-16 DIAGNOSIS — E119 Type 2 diabetes mellitus without complications: Secondary | ICD-10-CM | POA: Diagnosis not present

## 2017-09-17 DIAGNOSIS — Z4932 Encounter for adequacy testing for peritoneal dialysis: Secondary | ICD-10-CM | POA: Diagnosis not present

## 2017-09-17 DIAGNOSIS — N2589 Other disorders resulting from impaired renal tubular function: Secondary | ICD-10-CM | POA: Diagnosis not present

## 2017-09-17 DIAGNOSIS — N2581 Secondary hyperparathyroidism of renal origin: Secondary | ICD-10-CM | POA: Diagnosis not present

## 2017-09-17 DIAGNOSIS — E7849 Other hyperlipidemia: Secondary | ICD-10-CM | POA: Diagnosis not present

## 2017-09-17 DIAGNOSIS — E119 Type 2 diabetes mellitus without complications: Secondary | ICD-10-CM | POA: Diagnosis not present

## 2017-09-17 DIAGNOSIS — D631 Anemia in chronic kidney disease: Secondary | ICD-10-CM | POA: Diagnosis not present

## 2017-09-17 DIAGNOSIS — N186 End stage renal disease: Secondary | ICD-10-CM | POA: Diagnosis not present

## 2017-09-17 DIAGNOSIS — D509 Iron deficiency anemia, unspecified: Secondary | ICD-10-CM | POA: Diagnosis not present

## 2017-09-18 DIAGNOSIS — N186 End stage renal disease: Secondary | ICD-10-CM | POA: Diagnosis not present

## 2017-09-18 DIAGNOSIS — D509 Iron deficiency anemia, unspecified: Secondary | ICD-10-CM | POA: Diagnosis not present

## 2017-09-18 DIAGNOSIS — N2581 Secondary hyperparathyroidism of renal origin: Secondary | ICD-10-CM | POA: Diagnosis not present

## 2017-09-18 DIAGNOSIS — N2589 Other disorders resulting from impaired renal tubular function: Secondary | ICD-10-CM | POA: Diagnosis not present

## 2017-09-18 DIAGNOSIS — D631 Anemia in chronic kidney disease: Secondary | ICD-10-CM | POA: Diagnosis not present

## 2017-09-18 DIAGNOSIS — Z4932 Encounter for adequacy testing for peritoneal dialysis: Secondary | ICD-10-CM | POA: Diagnosis not present

## 2017-09-18 DIAGNOSIS — E7849 Other hyperlipidemia: Secondary | ICD-10-CM | POA: Diagnosis not present

## 2017-09-18 DIAGNOSIS — E119 Type 2 diabetes mellitus without complications: Secondary | ICD-10-CM | POA: Diagnosis not present

## 2017-09-19 DIAGNOSIS — E1122 Type 2 diabetes mellitus with diabetic chronic kidney disease: Secondary | ICD-10-CM | POA: Diagnosis not present

## 2017-09-19 DIAGNOSIS — D631 Anemia in chronic kidney disease: Secondary | ICD-10-CM | POA: Diagnosis not present

## 2017-09-19 DIAGNOSIS — E7849 Other hyperlipidemia: Secondary | ICD-10-CM | POA: Diagnosis not present

## 2017-09-19 DIAGNOSIS — N2581 Secondary hyperparathyroidism of renal origin: Secondary | ICD-10-CM | POA: Diagnosis not present

## 2017-09-19 DIAGNOSIS — N2589 Other disorders resulting from impaired renal tubular function: Secondary | ICD-10-CM | POA: Diagnosis not present

## 2017-09-19 DIAGNOSIS — N186 End stage renal disease: Secondary | ICD-10-CM | POA: Diagnosis not present

## 2017-09-19 DIAGNOSIS — Z992 Dependence on renal dialysis: Secondary | ICD-10-CM | POA: Diagnosis not present

## 2017-09-19 DIAGNOSIS — E119 Type 2 diabetes mellitus without complications: Secondary | ICD-10-CM | POA: Diagnosis not present

## 2017-09-19 DIAGNOSIS — D509 Iron deficiency anemia, unspecified: Secondary | ICD-10-CM | POA: Diagnosis not present

## 2017-09-19 DIAGNOSIS — Z4932 Encounter for adequacy testing for peritoneal dialysis: Secondary | ICD-10-CM | POA: Diagnosis not present

## 2017-09-20 DIAGNOSIS — E440 Moderate protein-calorie malnutrition: Secondary | ICD-10-CM | POA: Diagnosis not present

## 2017-09-20 DIAGNOSIS — N186 End stage renal disease: Secondary | ICD-10-CM | POA: Diagnosis not present

## 2017-09-20 DIAGNOSIS — R17 Unspecified jaundice: Secondary | ICD-10-CM | POA: Diagnosis not present

## 2017-09-20 DIAGNOSIS — R82998 Other abnormal findings in urine: Secondary | ICD-10-CM | POA: Diagnosis not present

## 2017-09-20 DIAGNOSIS — Z79899 Other long term (current) drug therapy: Secondary | ICD-10-CM | POA: Diagnosis not present

## 2017-09-20 DIAGNOSIS — D631 Anemia in chronic kidney disease: Secondary | ICD-10-CM | POA: Diagnosis not present

## 2017-09-20 DIAGNOSIS — N2581 Secondary hyperparathyroidism of renal origin: Secondary | ICD-10-CM | POA: Diagnosis not present

## 2017-09-20 DIAGNOSIS — D509 Iron deficiency anemia, unspecified: Secondary | ICD-10-CM | POA: Diagnosis not present

## 2017-09-21 DIAGNOSIS — R17 Unspecified jaundice: Secondary | ICD-10-CM | POA: Diagnosis not present

## 2017-09-21 DIAGNOSIS — N2581 Secondary hyperparathyroidism of renal origin: Secondary | ICD-10-CM | POA: Diagnosis not present

## 2017-09-21 DIAGNOSIS — D631 Anemia in chronic kidney disease: Secondary | ICD-10-CM | POA: Diagnosis not present

## 2017-09-21 DIAGNOSIS — N186 End stage renal disease: Secondary | ICD-10-CM | POA: Diagnosis not present

## 2017-09-21 DIAGNOSIS — D509 Iron deficiency anemia, unspecified: Secondary | ICD-10-CM | POA: Diagnosis not present

## 2017-09-21 DIAGNOSIS — Z79899 Other long term (current) drug therapy: Secondary | ICD-10-CM | POA: Diagnosis not present

## 2017-09-21 DIAGNOSIS — E440 Moderate protein-calorie malnutrition: Secondary | ICD-10-CM | POA: Diagnosis not present

## 2017-09-21 DIAGNOSIS — R82998 Other abnormal findings in urine: Secondary | ICD-10-CM | POA: Diagnosis not present

## 2017-09-22 DIAGNOSIS — E440 Moderate protein-calorie malnutrition: Secondary | ICD-10-CM | POA: Diagnosis not present

## 2017-09-22 DIAGNOSIS — D509 Iron deficiency anemia, unspecified: Secondary | ICD-10-CM | POA: Diagnosis not present

## 2017-09-22 DIAGNOSIS — N2581 Secondary hyperparathyroidism of renal origin: Secondary | ICD-10-CM | POA: Diagnosis not present

## 2017-09-22 DIAGNOSIS — R82998 Other abnormal findings in urine: Secondary | ICD-10-CM | POA: Diagnosis not present

## 2017-09-22 DIAGNOSIS — D631 Anemia in chronic kidney disease: Secondary | ICD-10-CM | POA: Diagnosis not present

## 2017-09-22 DIAGNOSIS — R17 Unspecified jaundice: Secondary | ICD-10-CM | POA: Diagnosis not present

## 2017-09-22 DIAGNOSIS — N186 End stage renal disease: Secondary | ICD-10-CM | POA: Diagnosis not present

## 2017-09-22 DIAGNOSIS — Z79899 Other long term (current) drug therapy: Secondary | ICD-10-CM | POA: Diagnosis not present

## 2017-09-23 DIAGNOSIS — R82998 Other abnormal findings in urine: Secondary | ICD-10-CM | POA: Diagnosis not present

## 2017-09-23 DIAGNOSIS — E440 Moderate protein-calorie malnutrition: Secondary | ICD-10-CM | POA: Diagnosis not present

## 2017-09-23 DIAGNOSIS — R17 Unspecified jaundice: Secondary | ICD-10-CM | POA: Diagnosis not present

## 2017-09-23 DIAGNOSIS — D509 Iron deficiency anemia, unspecified: Secondary | ICD-10-CM | POA: Diagnosis not present

## 2017-09-23 DIAGNOSIS — N2581 Secondary hyperparathyroidism of renal origin: Secondary | ICD-10-CM | POA: Diagnosis not present

## 2017-09-23 DIAGNOSIS — N186 End stage renal disease: Secondary | ICD-10-CM | POA: Diagnosis not present

## 2017-09-23 DIAGNOSIS — Z79899 Other long term (current) drug therapy: Secondary | ICD-10-CM | POA: Diagnosis not present

## 2017-09-23 DIAGNOSIS — D631 Anemia in chronic kidney disease: Secondary | ICD-10-CM | POA: Diagnosis not present

## 2017-09-24 DIAGNOSIS — R82998 Other abnormal findings in urine: Secondary | ICD-10-CM | POA: Diagnosis not present

## 2017-09-24 DIAGNOSIS — Z79899 Other long term (current) drug therapy: Secondary | ICD-10-CM | POA: Diagnosis not present

## 2017-09-24 DIAGNOSIS — D509 Iron deficiency anemia, unspecified: Secondary | ICD-10-CM | POA: Diagnosis not present

## 2017-09-24 DIAGNOSIS — R17 Unspecified jaundice: Secondary | ICD-10-CM | POA: Diagnosis not present

## 2017-09-24 DIAGNOSIS — N2581 Secondary hyperparathyroidism of renal origin: Secondary | ICD-10-CM | POA: Diagnosis not present

## 2017-09-24 DIAGNOSIS — D631 Anemia in chronic kidney disease: Secondary | ICD-10-CM | POA: Diagnosis not present

## 2017-09-24 DIAGNOSIS — N186 End stage renal disease: Secondary | ICD-10-CM | POA: Diagnosis not present

## 2017-09-24 DIAGNOSIS — E440 Moderate protein-calorie malnutrition: Secondary | ICD-10-CM | POA: Diagnosis not present

## 2017-09-25 DIAGNOSIS — N186 End stage renal disease: Secondary | ICD-10-CM | POA: Diagnosis not present

## 2017-09-25 DIAGNOSIS — N2581 Secondary hyperparathyroidism of renal origin: Secondary | ICD-10-CM | POA: Diagnosis not present

## 2017-09-25 DIAGNOSIS — D631 Anemia in chronic kidney disease: Secondary | ICD-10-CM | POA: Diagnosis not present

## 2017-09-25 DIAGNOSIS — R82998 Other abnormal findings in urine: Secondary | ICD-10-CM | POA: Diagnosis not present

## 2017-09-25 DIAGNOSIS — E440 Moderate protein-calorie malnutrition: Secondary | ICD-10-CM | POA: Diagnosis not present

## 2017-09-25 DIAGNOSIS — D509 Iron deficiency anemia, unspecified: Secondary | ICD-10-CM | POA: Diagnosis not present

## 2017-09-25 DIAGNOSIS — R17 Unspecified jaundice: Secondary | ICD-10-CM | POA: Diagnosis not present

## 2017-09-25 DIAGNOSIS — Z79899 Other long term (current) drug therapy: Secondary | ICD-10-CM | POA: Diagnosis not present

## 2017-09-26 DIAGNOSIS — R82998 Other abnormal findings in urine: Secondary | ICD-10-CM | POA: Diagnosis not present

## 2017-09-26 DIAGNOSIS — Z79899 Other long term (current) drug therapy: Secondary | ICD-10-CM | POA: Diagnosis not present

## 2017-09-26 DIAGNOSIS — N2581 Secondary hyperparathyroidism of renal origin: Secondary | ICD-10-CM | POA: Diagnosis not present

## 2017-09-26 DIAGNOSIS — R17 Unspecified jaundice: Secondary | ICD-10-CM | POA: Diagnosis not present

## 2017-09-26 DIAGNOSIS — E440 Moderate protein-calorie malnutrition: Secondary | ICD-10-CM | POA: Diagnosis not present

## 2017-09-26 DIAGNOSIS — D509 Iron deficiency anemia, unspecified: Secondary | ICD-10-CM | POA: Diagnosis not present

## 2017-09-26 DIAGNOSIS — N186 End stage renal disease: Secondary | ICD-10-CM | POA: Diagnosis not present

## 2017-09-26 DIAGNOSIS — D631 Anemia in chronic kidney disease: Secondary | ICD-10-CM | POA: Diagnosis not present

## 2017-09-27 DIAGNOSIS — N186 End stage renal disease: Secondary | ICD-10-CM | POA: Diagnosis not present

## 2017-09-27 DIAGNOSIS — E440 Moderate protein-calorie malnutrition: Secondary | ICD-10-CM | POA: Diagnosis not present

## 2017-09-27 DIAGNOSIS — D631 Anemia in chronic kidney disease: Secondary | ICD-10-CM | POA: Diagnosis not present

## 2017-09-27 DIAGNOSIS — N2581 Secondary hyperparathyroidism of renal origin: Secondary | ICD-10-CM | POA: Diagnosis not present

## 2017-09-27 DIAGNOSIS — D509 Iron deficiency anemia, unspecified: Secondary | ICD-10-CM | POA: Diagnosis not present

## 2017-09-27 DIAGNOSIS — R82998 Other abnormal findings in urine: Secondary | ICD-10-CM | POA: Diagnosis not present

## 2017-09-27 DIAGNOSIS — R17 Unspecified jaundice: Secondary | ICD-10-CM | POA: Diagnosis not present

## 2017-09-27 DIAGNOSIS — Z79899 Other long term (current) drug therapy: Secondary | ICD-10-CM | POA: Diagnosis not present

## 2017-09-28 DIAGNOSIS — R17 Unspecified jaundice: Secondary | ICD-10-CM | POA: Diagnosis not present

## 2017-09-28 DIAGNOSIS — D509 Iron deficiency anemia, unspecified: Secondary | ICD-10-CM | POA: Diagnosis not present

## 2017-09-28 DIAGNOSIS — N2581 Secondary hyperparathyroidism of renal origin: Secondary | ICD-10-CM | POA: Diagnosis not present

## 2017-09-28 DIAGNOSIS — Z79899 Other long term (current) drug therapy: Secondary | ICD-10-CM | POA: Diagnosis not present

## 2017-09-28 DIAGNOSIS — E440 Moderate protein-calorie malnutrition: Secondary | ICD-10-CM | POA: Diagnosis not present

## 2017-09-28 DIAGNOSIS — R82998 Other abnormal findings in urine: Secondary | ICD-10-CM | POA: Diagnosis not present

## 2017-09-28 DIAGNOSIS — N186 End stage renal disease: Secondary | ICD-10-CM | POA: Diagnosis not present

## 2017-09-28 DIAGNOSIS — D631 Anemia in chronic kidney disease: Secondary | ICD-10-CM | POA: Diagnosis not present

## 2017-09-29 DIAGNOSIS — D631 Anemia in chronic kidney disease: Secondary | ICD-10-CM | POA: Diagnosis not present

## 2017-09-29 DIAGNOSIS — N2581 Secondary hyperparathyroidism of renal origin: Secondary | ICD-10-CM | POA: Diagnosis not present

## 2017-09-29 DIAGNOSIS — D509 Iron deficiency anemia, unspecified: Secondary | ICD-10-CM | POA: Diagnosis not present

## 2017-09-29 DIAGNOSIS — R17 Unspecified jaundice: Secondary | ICD-10-CM | POA: Diagnosis not present

## 2017-09-29 DIAGNOSIS — Z79899 Other long term (current) drug therapy: Secondary | ICD-10-CM | POA: Diagnosis not present

## 2017-09-29 DIAGNOSIS — R82998 Other abnormal findings in urine: Secondary | ICD-10-CM | POA: Diagnosis not present

## 2017-09-29 DIAGNOSIS — E440 Moderate protein-calorie malnutrition: Secondary | ICD-10-CM | POA: Diagnosis not present

## 2017-09-29 DIAGNOSIS — N186 End stage renal disease: Secondary | ICD-10-CM | POA: Diagnosis not present

## 2017-09-30 DIAGNOSIS — N2581 Secondary hyperparathyroidism of renal origin: Secondary | ICD-10-CM | POA: Diagnosis not present

## 2017-09-30 DIAGNOSIS — R17 Unspecified jaundice: Secondary | ICD-10-CM | POA: Diagnosis not present

## 2017-09-30 DIAGNOSIS — R82998 Other abnormal findings in urine: Secondary | ICD-10-CM | POA: Diagnosis not present

## 2017-09-30 DIAGNOSIS — D509 Iron deficiency anemia, unspecified: Secondary | ICD-10-CM | POA: Diagnosis not present

## 2017-09-30 DIAGNOSIS — E440 Moderate protein-calorie malnutrition: Secondary | ICD-10-CM | POA: Diagnosis not present

## 2017-09-30 DIAGNOSIS — D631 Anemia in chronic kidney disease: Secondary | ICD-10-CM | POA: Diagnosis not present

## 2017-09-30 DIAGNOSIS — N186 End stage renal disease: Secondary | ICD-10-CM | POA: Diagnosis not present

## 2017-09-30 DIAGNOSIS — Z79899 Other long term (current) drug therapy: Secondary | ICD-10-CM | POA: Diagnosis not present

## 2017-10-01 DIAGNOSIS — D631 Anemia in chronic kidney disease: Secondary | ICD-10-CM | POA: Diagnosis not present

## 2017-10-01 DIAGNOSIS — N2581 Secondary hyperparathyroidism of renal origin: Secondary | ICD-10-CM | POA: Diagnosis not present

## 2017-10-01 DIAGNOSIS — R17 Unspecified jaundice: Secondary | ICD-10-CM | POA: Diagnosis not present

## 2017-10-01 DIAGNOSIS — R82998 Other abnormal findings in urine: Secondary | ICD-10-CM | POA: Diagnosis not present

## 2017-10-01 DIAGNOSIS — Z79899 Other long term (current) drug therapy: Secondary | ICD-10-CM | POA: Diagnosis not present

## 2017-10-01 DIAGNOSIS — N186 End stage renal disease: Secondary | ICD-10-CM | POA: Diagnosis not present

## 2017-10-01 DIAGNOSIS — D509 Iron deficiency anemia, unspecified: Secondary | ICD-10-CM | POA: Diagnosis not present

## 2017-10-01 DIAGNOSIS — E440 Moderate protein-calorie malnutrition: Secondary | ICD-10-CM | POA: Diagnosis not present

## 2017-10-02 DIAGNOSIS — D509 Iron deficiency anemia, unspecified: Secondary | ICD-10-CM | POA: Diagnosis not present

## 2017-10-02 DIAGNOSIS — D631 Anemia in chronic kidney disease: Secondary | ICD-10-CM | POA: Diagnosis not present

## 2017-10-02 DIAGNOSIS — Z79899 Other long term (current) drug therapy: Secondary | ICD-10-CM | POA: Diagnosis not present

## 2017-10-02 DIAGNOSIS — N186 End stage renal disease: Secondary | ICD-10-CM | POA: Diagnosis not present

## 2017-10-02 DIAGNOSIS — R82998 Other abnormal findings in urine: Secondary | ICD-10-CM | POA: Diagnosis not present

## 2017-10-02 DIAGNOSIS — N2581 Secondary hyperparathyroidism of renal origin: Secondary | ICD-10-CM | POA: Diagnosis not present

## 2017-10-02 DIAGNOSIS — E440 Moderate protein-calorie malnutrition: Secondary | ICD-10-CM | POA: Diagnosis not present

## 2017-10-02 DIAGNOSIS — R17 Unspecified jaundice: Secondary | ICD-10-CM | POA: Diagnosis not present

## 2017-10-03 DIAGNOSIS — R82998 Other abnormal findings in urine: Secondary | ICD-10-CM | POA: Diagnosis not present

## 2017-10-03 DIAGNOSIS — E440 Moderate protein-calorie malnutrition: Secondary | ICD-10-CM | POA: Diagnosis not present

## 2017-10-03 DIAGNOSIS — N186 End stage renal disease: Secondary | ICD-10-CM | POA: Diagnosis not present

## 2017-10-03 DIAGNOSIS — N2581 Secondary hyperparathyroidism of renal origin: Secondary | ICD-10-CM | POA: Diagnosis not present

## 2017-10-03 DIAGNOSIS — D631 Anemia in chronic kidney disease: Secondary | ICD-10-CM | POA: Diagnosis not present

## 2017-10-03 DIAGNOSIS — D509 Iron deficiency anemia, unspecified: Secondary | ICD-10-CM | POA: Diagnosis not present

## 2017-10-03 DIAGNOSIS — R17 Unspecified jaundice: Secondary | ICD-10-CM | POA: Diagnosis not present

## 2017-10-03 DIAGNOSIS — Z79899 Other long term (current) drug therapy: Secondary | ICD-10-CM | POA: Diagnosis not present

## 2017-10-04 DIAGNOSIS — R82998 Other abnormal findings in urine: Secondary | ICD-10-CM | POA: Diagnosis not present

## 2017-10-04 DIAGNOSIS — Z79899 Other long term (current) drug therapy: Secondary | ICD-10-CM | POA: Diagnosis not present

## 2017-10-04 DIAGNOSIS — E440 Moderate protein-calorie malnutrition: Secondary | ICD-10-CM | POA: Diagnosis not present

## 2017-10-04 DIAGNOSIS — N186 End stage renal disease: Secondary | ICD-10-CM | POA: Diagnosis not present

## 2017-10-04 DIAGNOSIS — N2581 Secondary hyperparathyroidism of renal origin: Secondary | ICD-10-CM | POA: Diagnosis not present

## 2017-10-04 DIAGNOSIS — D631 Anemia in chronic kidney disease: Secondary | ICD-10-CM | POA: Diagnosis not present

## 2017-10-04 DIAGNOSIS — D509 Iron deficiency anemia, unspecified: Secondary | ICD-10-CM | POA: Diagnosis not present

## 2017-10-04 DIAGNOSIS — R17 Unspecified jaundice: Secondary | ICD-10-CM | POA: Diagnosis not present

## 2017-10-05 ENCOUNTER — Ambulatory Visit: Payer: Self-pay | Admitting: Internal Medicine

## 2017-10-05 DIAGNOSIS — R17 Unspecified jaundice: Secondary | ICD-10-CM | POA: Diagnosis not present

## 2017-10-05 DIAGNOSIS — N2581 Secondary hyperparathyroidism of renal origin: Secondary | ICD-10-CM | POA: Diagnosis not present

## 2017-10-05 DIAGNOSIS — E440 Moderate protein-calorie malnutrition: Secondary | ICD-10-CM | POA: Diagnosis not present

## 2017-10-05 DIAGNOSIS — Z79899 Other long term (current) drug therapy: Secondary | ICD-10-CM | POA: Diagnosis not present

## 2017-10-05 DIAGNOSIS — D631 Anemia in chronic kidney disease: Secondary | ICD-10-CM | POA: Diagnosis not present

## 2017-10-05 DIAGNOSIS — R82998 Other abnormal findings in urine: Secondary | ICD-10-CM | POA: Diagnosis not present

## 2017-10-05 DIAGNOSIS — D509 Iron deficiency anemia, unspecified: Secondary | ICD-10-CM | POA: Diagnosis not present

## 2017-10-05 DIAGNOSIS — N186 End stage renal disease: Secondary | ICD-10-CM | POA: Diagnosis not present

## 2017-10-06 DIAGNOSIS — E440 Moderate protein-calorie malnutrition: Secondary | ICD-10-CM | POA: Diagnosis not present

## 2017-10-06 DIAGNOSIS — R17 Unspecified jaundice: Secondary | ICD-10-CM | POA: Diagnosis not present

## 2017-10-06 DIAGNOSIS — R82998 Other abnormal findings in urine: Secondary | ICD-10-CM | POA: Diagnosis not present

## 2017-10-06 DIAGNOSIS — N2581 Secondary hyperparathyroidism of renal origin: Secondary | ICD-10-CM | POA: Diagnosis not present

## 2017-10-06 DIAGNOSIS — D631 Anemia in chronic kidney disease: Secondary | ICD-10-CM | POA: Diagnosis not present

## 2017-10-06 DIAGNOSIS — Z79899 Other long term (current) drug therapy: Secondary | ICD-10-CM | POA: Diagnosis not present

## 2017-10-06 DIAGNOSIS — D509 Iron deficiency anemia, unspecified: Secondary | ICD-10-CM | POA: Diagnosis not present

## 2017-10-06 DIAGNOSIS — N186 End stage renal disease: Secondary | ICD-10-CM | POA: Diagnosis not present

## 2017-10-07 DIAGNOSIS — Z79899 Other long term (current) drug therapy: Secondary | ICD-10-CM | POA: Diagnosis not present

## 2017-10-07 DIAGNOSIS — R82998 Other abnormal findings in urine: Secondary | ICD-10-CM | POA: Diagnosis not present

## 2017-10-07 DIAGNOSIS — N2581 Secondary hyperparathyroidism of renal origin: Secondary | ICD-10-CM | POA: Diagnosis not present

## 2017-10-07 DIAGNOSIS — D631 Anemia in chronic kidney disease: Secondary | ICD-10-CM | POA: Diagnosis not present

## 2017-10-07 DIAGNOSIS — R17 Unspecified jaundice: Secondary | ICD-10-CM | POA: Diagnosis not present

## 2017-10-07 DIAGNOSIS — N186 End stage renal disease: Secondary | ICD-10-CM | POA: Diagnosis not present

## 2017-10-07 DIAGNOSIS — D509 Iron deficiency anemia, unspecified: Secondary | ICD-10-CM | POA: Diagnosis not present

## 2017-10-07 DIAGNOSIS — E440 Moderate protein-calorie malnutrition: Secondary | ICD-10-CM | POA: Diagnosis not present

## 2017-10-08 DIAGNOSIS — R17 Unspecified jaundice: Secondary | ICD-10-CM | POA: Diagnosis not present

## 2017-10-08 DIAGNOSIS — Z79899 Other long term (current) drug therapy: Secondary | ICD-10-CM | POA: Diagnosis not present

## 2017-10-08 DIAGNOSIS — D509 Iron deficiency anemia, unspecified: Secondary | ICD-10-CM | POA: Diagnosis not present

## 2017-10-08 DIAGNOSIS — N2581 Secondary hyperparathyroidism of renal origin: Secondary | ICD-10-CM | POA: Diagnosis not present

## 2017-10-08 DIAGNOSIS — N186 End stage renal disease: Secondary | ICD-10-CM | POA: Diagnosis not present

## 2017-10-08 DIAGNOSIS — E440 Moderate protein-calorie malnutrition: Secondary | ICD-10-CM | POA: Diagnosis not present

## 2017-10-08 DIAGNOSIS — R82998 Other abnormal findings in urine: Secondary | ICD-10-CM | POA: Diagnosis not present

## 2017-10-08 DIAGNOSIS — D631 Anemia in chronic kidney disease: Secondary | ICD-10-CM | POA: Diagnosis not present

## 2017-10-09 ENCOUNTER — Encounter (INDEPENDENT_AMBULATORY_CARE_PROVIDER_SITE_OTHER): Payer: Medicare Other | Admitting: Ophthalmology

## 2017-10-09 DIAGNOSIS — E113311 Type 2 diabetes mellitus with moderate nonproliferative diabetic retinopathy with macular edema, right eye: Secondary | ICD-10-CM | POA: Diagnosis not present

## 2017-10-09 DIAGNOSIS — E113292 Type 2 diabetes mellitus with mild nonproliferative diabetic retinopathy without macular edema, left eye: Secondary | ICD-10-CM | POA: Diagnosis not present

## 2017-10-09 DIAGNOSIS — H43813 Vitreous degeneration, bilateral: Secondary | ICD-10-CM

## 2017-10-09 DIAGNOSIS — I10 Essential (primary) hypertension: Secondary | ICD-10-CM

## 2017-10-09 DIAGNOSIS — Z79899 Other long term (current) drug therapy: Secondary | ICD-10-CM | POA: Diagnosis not present

## 2017-10-09 DIAGNOSIS — E11311 Type 2 diabetes mellitus with unspecified diabetic retinopathy with macular edema: Secondary | ICD-10-CM | POA: Diagnosis not present

## 2017-10-09 DIAGNOSIS — N2581 Secondary hyperparathyroidism of renal origin: Secondary | ICD-10-CM | POA: Diagnosis not present

## 2017-10-09 DIAGNOSIS — D631 Anemia in chronic kidney disease: Secondary | ICD-10-CM | POA: Diagnosis not present

## 2017-10-09 DIAGNOSIS — H35033 Hypertensive retinopathy, bilateral: Secondary | ICD-10-CM | POA: Diagnosis not present

## 2017-10-09 DIAGNOSIS — D509 Iron deficiency anemia, unspecified: Secondary | ICD-10-CM | POA: Diagnosis not present

## 2017-10-09 DIAGNOSIS — N186 End stage renal disease: Secondary | ICD-10-CM | POA: Diagnosis not present

## 2017-10-09 DIAGNOSIS — R82998 Other abnormal findings in urine: Secondary | ICD-10-CM | POA: Diagnosis not present

## 2017-10-09 DIAGNOSIS — R17 Unspecified jaundice: Secondary | ICD-10-CM | POA: Diagnosis not present

## 2017-10-09 DIAGNOSIS — E440 Moderate protein-calorie malnutrition: Secondary | ICD-10-CM | POA: Diagnosis not present

## 2017-10-10 DIAGNOSIS — E440 Moderate protein-calorie malnutrition: Secondary | ICD-10-CM | POA: Diagnosis not present

## 2017-10-10 DIAGNOSIS — N2581 Secondary hyperparathyroidism of renal origin: Secondary | ICD-10-CM | POA: Diagnosis not present

## 2017-10-10 DIAGNOSIS — Z79899 Other long term (current) drug therapy: Secondary | ICD-10-CM | POA: Diagnosis not present

## 2017-10-10 DIAGNOSIS — N186 End stage renal disease: Secondary | ICD-10-CM | POA: Diagnosis not present

## 2017-10-10 DIAGNOSIS — R82998 Other abnormal findings in urine: Secondary | ICD-10-CM | POA: Diagnosis not present

## 2017-10-10 DIAGNOSIS — D631 Anemia in chronic kidney disease: Secondary | ICD-10-CM | POA: Diagnosis not present

## 2017-10-10 DIAGNOSIS — R17 Unspecified jaundice: Secondary | ICD-10-CM | POA: Diagnosis not present

## 2017-10-10 DIAGNOSIS — D509 Iron deficiency anemia, unspecified: Secondary | ICD-10-CM | POA: Diagnosis not present

## 2017-10-11 DIAGNOSIS — D631 Anemia in chronic kidney disease: Secondary | ICD-10-CM | POA: Diagnosis not present

## 2017-10-11 DIAGNOSIS — D509 Iron deficiency anemia, unspecified: Secondary | ICD-10-CM | POA: Diagnosis not present

## 2017-10-11 DIAGNOSIS — E440 Moderate protein-calorie malnutrition: Secondary | ICD-10-CM | POA: Diagnosis not present

## 2017-10-11 DIAGNOSIS — R17 Unspecified jaundice: Secondary | ICD-10-CM | POA: Diagnosis not present

## 2017-10-11 DIAGNOSIS — N186 End stage renal disease: Secondary | ICD-10-CM | POA: Diagnosis not present

## 2017-10-11 DIAGNOSIS — Z79899 Other long term (current) drug therapy: Secondary | ICD-10-CM | POA: Diagnosis not present

## 2017-10-11 DIAGNOSIS — N2581 Secondary hyperparathyroidism of renal origin: Secondary | ICD-10-CM | POA: Diagnosis not present

## 2017-10-11 DIAGNOSIS — R82998 Other abnormal findings in urine: Secondary | ICD-10-CM | POA: Diagnosis not present

## 2017-10-12 DIAGNOSIS — E440 Moderate protein-calorie malnutrition: Secondary | ICD-10-CM | POA: Diagnosis not present

## 2017-10-12 DIAGNOSIS — R17 Unspecified jaundice: Secondary | ICD-10-CM | POA: Diagnosis not present

## 2017-10-12 DIAGNOSIS — N2581 Secondary hyperparathyroidism of renal origin: Secondary | ICD-10-CM | POA: Diagnosis not present

## 2017-10-12 DIAGNOSIS — Z79899 Other long term (current) drug therapy: Secondary | ICD-10-CM | POA: Diagnosis not present

## 2017-10-12 DIAGNOSIS — R82998 Other abnormal findings in urine: Secondary | ICD-10-CM | POA: Diagnosis not present

## 2017-10-12 DIAGNOSIS — D631 Anemia in chronic kidney disease: Secondary | ICD-10-CM | POA: Diagnosis not present

## 2017-10-12 DIAGNOSIS — N186 End stage renal disease: Secondary | ICD-10-CM | POA: Diagnosis not present

## 2017-10-12 DIAGNOSIS — D509 Iron deficiency anemia, unspecified: Secondary | ICD-10-CM | POA: Diagnosis not present

## 2017-10-13 DIAGNOSIS — N2581 Secondary hyperparathyroidism of renal origin: Secondary | ICD-10-CM | POA: Diagnosis not present

## 2017-10-13 DIAGNOSIS — E440 Moderate protein-calorie malnutrition: Secondary | ICD-10-CM | POA: Diagnosis not present

## 2017-10-13 DIAGNOSIS — D509 Iron deficiency anemia, unspecified: Secondary | ICD-10-CM | POA: Diagnosis not present

## 2017-10-13 DIAGNOSIS — R82998 Other abnormal findings in urine: Secondary | ICD-10-CM | POA: Diagnosis not present

## 2017-10-13 DIAGNOSIS — N186 End stage renal disease: Secondary | ICD-10-CM | POA: Diagnosis not present

## 2017-10-13 DIAGNOSIS — D631 Anemia in chronic kidney disease: Secondary | ICD-10-CM | POA: Diagnosis not present

## 2017-10-13 DIAGNOSIS — Z79899 Other long term (current) drug therapy: Secondary | ICD-10-CM | POA: Diagnosis not present

## 2017-10-13 DIAGNOSIS — R17 Unspecified jaundice: Secondary | ICD-10-CM | POA: Diagnosis not present

## 2017-10-14 DIAGNOSIS — R82998 Other abnormal findings in urine: Secondary | ICD-10-CM | POA: Diagnosis not present

## 2017-10-14 DIAGNOSIS — E440 Moderate protein-calorie malnutrition: Secondary | ICD-10-CM | POA: Diagnosis not present

## 2017-10-14 DIAGNOSIS — N2581 Secondary hyperparathyroidism of renal origin: Secondary | ICD-10-CM | POA: Diagnosis not present

## 2017-10-14 DIAGNOSIS — R17 Unspecified jaundice: Secondary | ICD-10-CM | POA: Diagnosis not present

## 2017-10-14 DIAGNOSIS — D631 Anemia in chronic kidney disease: Secondary | ICD-10-CM | POA: Diagnosis not present

## 2017-10-14 DIAGNOSIS — N186 End stage renal disease: Secondary | ICD-10-CM | POA: Diagnosis not present

## 2017-10-14 DIAGNOSIS — Z79899 Other long term (current) drug therapy: Secondary | ICD-10-CM | POA: Diagnosis not present

## 2017-10-14 DIAGNOSIS — D509 Iron deficiency anemia, unspecified: Secondary | ICD-10-CM | POA: Diagnosis not present

## 2017-10-15 DIAGNOSIS — D631 Anemia in chronic kidney disease: Secondary | ICD-10-CM | POA: Diagnosis not present

## 2017-10-15 DIAGNOSIS — D509 Iron deficiency anemia, unspecified: Secondary | ICD-10-CM | POA: Diagnosis not present

## 2017-10-15 DIAGNOSIS — E440 Moderate protein-calorie malnutrition: Secondary | ICD-10-CM | POA: Diagnosis not present

## 2017-10-15 DIAGNOSIS — Z79899 Other long term (current) drug therapy: Secondary | ICD-10-CM | POA: Diagnosis not present

## 2017-10-15 DIAGNOSIS — N186 End stage renal disease: Secondary | ICD-10-CM | POA: Diagnosis not present

## 2017-10-15 DIAGNOSIS — N2581 Secondary hyperparathyroidism of renal origin: Secondary | ICD-10-CM | POA: Diagnosis not present

## 2017-10-15 DIAGNOSIS — R17 Unspecified jaundice: Secondary | ICD-10-CM | POA: Diagnosis not present

## 2017-10-15 DIAGNOSIS — R82998 Other abnormal findings in urine: Secondary | ICD-10-CM | POA: Diagnosis not present

## 2017-10-16 DIAGNOSIS — D509 Iron deficiency anemia, unspecified: Secondary | ICD-10-CM | POA: Diagnosis not present

## 2017-10-16 DIAGNOSIS — N2581 Secondary hyperparathyroidism of renal origin: Secondary | ICD-10-CM | POA: Diagnosis not present

## 2017-10-16 DIAGNOSIS — R82998 Other abnormal findings in urine: Secondary | ICD-10-CM | POA: Diagnosis not present

## 2017-10-16 DIAGNOSIS — Z79899 Other long term (current) drug therapy: Secondary | ICD-10-CM | POA: Diagnosis not present

## 2017-10-16 DIAGNOSIS — D631 Anemia in chronic kidney disease: Secondary | ICD-10-CM | POA: Diagnosis not present

## 2017-10-16 DIAGNOSIS — R17 Unspecified jaundice: Secondary | ICD-10-CM | POA: Diagnosis not present

## 2017-10-16 DIAGNOSIS — E440 Moderate protein-calorie malnutrition: Secondary | ICD-10-CM | POA: Diagnosis not present

## 2017-10-16 DIAGNOSIS — N186 End stage renal disease: Secondary | ICD-10-CM | POA: Diagnosis not present

## 2017-10-17 DIAGNOSIS — N2581 Secondary hyperparathyroidism of renal origin: Secondary | ICD-10-CM | POA: Diagnosis not present

## 2017-10-17 DIAGNOSIS — E440 Moderate protein-calorie malnutrition: Secondary | ICD-10-CM | POA: Diagnosis not present

## 2017-10-17 DIAGNOSIS — R17 Unspecified jaundice: Secondary | ICD-10-CM | POA: Diagnosis not present

## 2017-10-17 DIAGNOSIS — N186 End stage renal disease: Secondary | ICD-10-CM | POA: Diagnosis not present

## 2017-10-17 DIAGNOSIS — D509 Iron deficiency anemia, unspecified: Secondary | ICD-10-CM | POA: Diagnosis not present

## 2017-10-17 DIAGNOSIS — R82998 Other abnormal findings in urine: Secondary | ICD-10-CM | POA: Diagnosis not present

## 2017-10-17 DIAGNOSIS — D631 Anemia in chronic kidney disease: Secondary | ICD-10-CM | POA: Diagnosis not present

## 2017-10-17 DIAGNOSIS — Z79899 Other long term (current) drug therapy: Secondary | ICD-10-CM | POA: Diagnosis not present

## 2017-10-18 DIAGNOSIS — D631 Anemia in chronic kidney disease: Secondary | ICD-10-CM | POA: Diagnosis not present

## 2017-10-18 DIAGNOSIS — N186 End stage renal disease: Secondary | ICD-10-CM | POA: Diagnosis not present

## 2017-10-18 DIAGNOSIS — N2581 Secondary hyperparathyroidism of renal origin: Secondary | ICD-10-CM | POA: Diagnosis not present

## 2017-10-18 DIAGNOSIS — E440 Moderate protein-calorie malnutrition: Secondary | ICD-10-CM | POA: Diagnosis not present

## 2017-10-18 DIAGNOSIS — R82998 Other abnormal findings in urine: Secondary | ICD-10-CM | POA: Diagnosis not present

## 2017-10-18 DIAGNOSIS — R17 Unspecified jaundice: Secondary | ICD-10-CM | POA: Diagnosis not present

## 2017-10-18 DIAGNOSIS — Z79899 Other long term (current) drug therapy: Secondary | ICD-10-CM | POA: Diagnosis not present

## 2017-10-18 DIAGNOSIS — D509 Iron deficiency anemia, unspecified: Secondary | ICD-10-CM | POA: Diagnosis not present

## 2017-10-19 DIAGNOSIS — E1122 Type 2 diabetes mellitus with diabetic chronic kidney disease: Secondary | ICD-10-CM | POA: Diagnosis not present

## 2017-10-19 DIAGNOSIS — Z79899 Other long term (current) drug therapy: Secondary | ICD-10-CM | POA: Diagnosis not present

## 2017-10-19 DIAGNOSIS — R17 Unspecified jaundice: Secondary | ICD-10-CM | POA: Diagnosis not present

## 2017-10-19 DIAGNOSIS — R82998 Other abnormal findings in urine: Secondary | ICD-10-CM | POA: Diagnosis not present

## 2017-10-19 DIAGNOSIS — D631 Anemia in chronic kidney disease: Secondary | ICD-10-CM | POA: Diagnosis not present

## 2017-10-19 DIAGNOSIS — D509 Iron deficiency anemia, unspecified: Secondary | ICD-10-CM | POA: Diagnosis not present

## 2017-10-19 DIAGNOSIS — N2581 Secondary hyperparathyroidism of renal origin: Secondary | ICD-10-CM | POA: Diagnosis not present

## 2017-10-19 DIAGNOSIS — E440 Moderate protein-calorie malnutrition: Secondary | ICD-10-CM | POA: Diagnosis not present

## 2017-10-19 DIAGNOSIS — N186 End stage renal disease: Secondary | ICD-10-CM | POA: Diagnosis not present

## 2017-10-19 DIAGNOSIS — Z992 Dependence on renal dialysis: Secondary | ICD-10-CM | POA: Diagnosis not present

## 2017-10-20 DIAGNOSIS — N186 End stage renal disease: Secondary | ICD-10-CM | POA: Diagnosis not present

## 2017-10-20 DIAGNOSIS — R17 Unspecified jaundice: Secondary | ICD-10-CM | POA: Diagnosis not present

## 2017-10-20 DIAGNOSIS — Z79899 Other long term (current) drug therapy: Secondary | ICD-10-CM | POA: Diagnosis not present

## 2017-10-20 DIAGNOSIS — D631 Anemia in chronic kidney disease: Secondary | ICD-10-CM | POA: Diagnosis not present

## 2017-10-20 DIAGNOSIS — E440 Moderate protein-calorie malnutrition: Secondary | ICD-10-CM | POA: Diagnosis not present

## 2017-10-20 DIAGNOSIS — K769 Liver disease, unspecified: Secondary | ICD-10-CM | POA: Diagnosis not present

## 2017-10-20 DIAGNOSIS — D509 Iron deficiency anemia, unspecified: Secondary | ICD-10-CM | POA: Diagnosis not present

## 2017-10-20 DIAGNOSIS — N2581 Secondary hyperparathyroidism of renal origin: Secondary | ICD-10-CM | POA: Diagnosis not present

## 2017-10-20 DIAGNOSIS — R82998 Other abnormal findings in urine: Secondary | ICD-10-CM | POA: Diagnosis not present

## 2017-10-21 DIAGNOSIS — R82998 Other abnormal findings in urine: Secondary | ICD-10-CM | POA: Diagnosis not present

## 2017-10-21 DIAGNOSIS — Z79899 Other long term (current) drug therapy: Secondary | ICD-10-CM | POA: Diagnosis not present

## 2017-10-21 DIAGNOSIS — D631 Anemia in chronic kidney disease: Secondary | ICD-10-CM | POA: Diagnosis not present

## 2017-10-21 DIAGNOSIS — R17 Unspecified jaundice: Secondary | ICD-10-CM | POA: Diagnosis not present

## 2017-10-21 DIAGNOSIS — E440 Moderate protein-calorie malnutrition: Secondary | ICD-10-CM | POA: Diagnosis not present

## 2017-10-21 DIAGNOSIS — K769 Liver disease, unspecified: Secondary | ICD-10-CM | POA: Diagnosis not present

## 2017-10-21 DIAGNOSIS — N186 End stage renal disease: Secondary | ICD-10-CM | POA: Diagnosis not present

## 2017-10-21 DIAGNOSIS — N2581 Secondary hyperparathyroidism of renal origin: Secondary | ICD-10-CM | POA: Diagnosis not present

## 2017-10-21 DIAGNOSIS — D509 Iron deficiency anemia, unspecified: Secondary | ICD-10-CM | POA: Diagnosis not present

## 2017-10-22 DIAGNOSIS — D631 Anemia in chronic kidney disease: Secondary | ICD-10-CM | POA: Diagnosis not present

## 2017-10-22 DIAGNOSIS — N186 End stage renal disease: Secondary | ICD-10-CM | POA: Diagnosis not present

## 2017-10-22 DIAGNOSIS — Z79899 Other long term (current) drug therapy: Secondary | ICD-10-CM | POA: Diagnosis not present

## 2017-10-22 DIAGNOSIS — E440 Moderate protein-calorie malnutrition: Secondary | ICD-10-CM | POA: Diagnosis not present

## 2017-10-22 DIAGNOSIS — R17 Unspecified jaundice: Secondary | ICD-10-CM | POA: Diagnosis not present

## 2017-10-22 DIAGNOSIS — N2581 Secondary hyperparathyroidism of renal origin: Secondary | ICD-10-CM | POA: Diagnosis not present

## 2017-10-22 DIAGNOSIS — R82998 Other abnormal findings in urine: Secondary | ICD-10-CM | POA: Diagnosis not present

## 2017-10-22 DIAGNOSIS — K769 Liver disease, unspecified: Secondary | ICD-10-CM | POA: Diagnosis not present

## 2017-10-22 DIAGNOSIS — D509 Iron deficiency anemia, unspecified: Secondary | ICD-10-CM | POA: Diagnosis not present

## 2017-10-23 DIAGNOSIS — D509 Iron deficiency anemia, unspecified: Secondary | ICD-10-CM | POA: Diagnosis not present

## 2017-10-23 DIAGNOSIS — E440 Moderate protein-calorie malnutrition: Secondary | ICD-10-CM | POA: Diagnosis not present

## 2017-10-23 DIAGNOSIS — K769 Liver disease, unspecified: Secondary | ICD-10-CM | POA: Diagnosis not present

## 2017-10-23 DIAGNOSIS — N2581 Secondary hyperparathyroidism of renal origin: Secondary | ICD-10-CM | POA: Diagnosis not present

## 2017-10-23 DIAGNOSIS — Z79899 Other long term (current) drug therapy: Secondary | ICD-10-CM | POA: Diagnosis not present

## 2017-10-23 DIAGNOSIS — N186 End stage renal disease: Secondary | ICD-10-CM | POA: Diagnosis not present

## 2017-10-23 DIAGNOSIS — D631 Anemia in chronic kidney disease: Secondary | ICD-10-CM | POA: Diagnosis not present

## 2017-10-23 DIAGNOSIS — R17 Unspecified jaundice: Secondary | ICD-10-CM | POA: Diagnosis not present

## 2017-10-23 DIAGNOSIS — R82998 Other abnormal findings in urine: Secondary | ICD-10-CM | POA: Diagnosis not present

## 2017-10-24 DIAGNOSIS — D509 Iron deficiency anemia, unspecified: Secondary | ICD-10-CM | POA: Diagnosis not present

## 2017-10-24 DIAGNOSIS — E440 Moderate protein-calorie malnutrition: Secondary | ICD-10-CM | POA: Diagnosis not present

## 2017-10-24 DIAGNOSIS — D631 Anemia in chronic kidney disease: Secondary | ICD-10-CM | POA: Diagnosis not present

## 2017-10-24 DIAGNOSIS — R17 Unspecified jaundice: Secondary | ICD-10-CM | POA: Diagnosis not present

## 2017-10-24 DIAGNOSIS — R82998 Other abnormal findings in urine: Secondary | ICD-10-CM | POA: Diagnosis not present

## 2017-10-24 DIAGNOSIS — Z79899 Other long term (current) drug therapy: Secondary | ICD-10-CM | POA: Diagnosis not present

## 2017-10-24 DIAGNOSIS — N2581 Secondary hyperparathyroidism of renal origin: Secondary | ICD-10-CM | POA: Diagnosis not present

## 2017-10-24 DIAGNOSIS — N186 End stage renal disease: Secondary | ICD-10-CM | POA: Diagnosis not present

## 2017-10-24 DIAGNOSIS — K769 Liver disease, unspecified: Secondary | ICD-10-CM | POA: Diagnosis not present

## 2017-10-25 DIAGNOSIS — D631 Anemia in chronic kidney disease: Secondary | ICD-10-CM | POA: Diagnosis not present

## 2017-10-25 DIAGNOSIS — R82998 Other abnormal findings in urine: Secondary | ICD-10-CM | POA: Diagnosis not present

## 2017-10-25 DIAGNOSIS — E440 Moderate protein-calorie malnutrition: Secondary | ICD-10-CM | POA: Diagnosis not present

## 2017-10-25 DIAGNOSIS — D509 Iron deficiency anemia, unspecified: Secondary | ICD-10-CM | POA: Diagnosis not present

## 2017-10-25 DIAGNOSIS — Z79899 Other long term (current) drug therapy: Secondary | ICD-10-CM | POA: Diagnosis not present

## 2017-10-25 DIAGNOSIS — R17 Unspecified jaundice: Secondary | ICD-10-CM | POA: Diagnosis not present

## 2017-10-25 DIAGNOSIS — N2581 Secondary hyperparathyroidism of renal origin: Secondary | ICD-10-CM | POA: Diagnosis not present

## 2017-10-25 DIAGNOSIS — K769 Liver disease, unspecified: Secondary | ICD-10-CM | POA: Diagnosis not present

## 2017-10-25 DIAGNOSIS — N186 End stage renal disease: Secondary | ICD-10-CM | POA: Diagnosis not present

## 2017-10-26 DIAGNOSIS — R17 Unspecified jaundice: Secondary | ICD-10-CM | POA: Diagnosis not present

## 2017-10-26 DIAGNOSIS — K769 Liver disease, unspecified: Secondary | ICD-10-CM | POA: Diagnosis not present

## 2017-10-26 DIAGNOSIS — Z79899 Other long term (current) drug therapy: Secondary | ICD-10-CM | POA: Diagnosis not present

## 2017-10-26 DIAGNOSIS — R82998 Other abnormal findings in urine: Secondary | ICD-10-CM | POA: Diagnosis not present

## 2017-10-26 DIAGNOSIS — N2581 Secondary hyperparathyroidism of renal origin: Secondary | ICD-10-CM | POA: Diagnosis not present

## 2017-10-26 DIAGNOSIS — D631 Anemia in chronic kidney disease: Secondary | ICD-10-CM | POA: Diagnosis not present

## 2017-10-26 DIAGNOSIS — N186 End stage renal disease: Secondary | ICD-10-CM | POA: Diagnosis not present

## 2017-10-26 DIAGNOSIS — E440 Moderate protein-calorie malnutrition: Secondary | ICD-10-CM | POA: Diagnosis not present

## 2017-10-26 DIAGNOSIS — D509 Iron deficiency anemia, unspecified: Secondary | ICD-10-CM | POA: Diagnosis not present

## 2017-10-27 DIAGNOSIS — E440 Moderate protein-calorie malnutrition: Secondary | ICD-10-CM | POA: Diagnosis not present

## 2017-10-27 DIAGNOSIS — Z79899 Other long term (current) drug therapy: Secondary | ICD-10-CM | POA: Diagnosis not present

## 2017-10-27 DIAGNOSIS — N2581 Secondary hyperparathyroidism of renal origin: Secondary | ICD-10-CM | POA: Diagnosis not present

## 2017-10-27 DIAGNOSIS — R17 Unspecified jaundice: Secondary | ICD-10-CM | POA: Diagnosis not present

## 2017-10-27 DIAGNOSIS — R82998 Other abnormal findings in urine: Secondary | ICD-10-CM | POA: Diagnosis not present

## 2017-10-27 DIAGNOSIS — D509 Iron deficiency anemia, unspecified: Secondary | ICD-10-CM | POA: Diagnosis not present

## 2017-10-27 DIAGNOSIS — K769 Liver disease, unspecified: Secondary | ICD-10-CM | POA: Diagnosis not present

## 2017-10-27 DIAGNOSIS — N186 End stage renal disease: Secondary | ICD-10-CM | POA: Diagnosis not present

## 2017-10-27 DIAGNOSIS — D631 Anemia in chronic kidney disease: Secondary | ICD-10-CM | POA: Diagnosis not present

## 2017-10-28 DIAGNOSIS — D631 Anemia in chronic kidney disease: Secondary | ICD-10-CM | POA: Diagnosis not present

## 2017-10-28 DIAGNOSIS — R17 Unspecified jaundice: Secondary | ICD-10-CM | POA: Diagnosis not present

## 2017-10-28 DIAGNOSIS — K769 Liver disease, unspecified: Secondary | ICD-10-CM | POA: Diagnosis not present

## 2017-10-28 DIAGNOSIS — R82998 Other abnormal findings in urine: Secondary | ICD-10-CM | POA: Diagnosis not present

## 2017-10-28 DIAGNOSIS — E440 Moderate protein-calorie malnutrition: Secondary | ICD-10-CM | POA: Diagnosis not present

## 2017-10-28 DIAGNOSIS — Z79899 Other long term (current) drug therapy: Secondary | ICD-10-CM | POA: Diagnosis not present

## 2017-10-28 DIAGNOSIS — N2581 Secondary hyperparathyroidism of renal origin: Secondary | ICD-10-CM | POA: Diagnosis not present

## 2017-10-28 DIAGNOSIS — N186 End stage renal disease: Secondary | ICD-10-CM | POA: Diagnosis not present

## 2017-10-28 DIAGNOSIS — D509 Iron deficiency anemia, unspecified: Secondary | ICD-10-CM | POA: Diagnosis not present

## 2017-10-29 DIAGNOSIS — N186 End stage renal disease: Secondary | ICD-10-CM | POA: Diagnosis not present

## 2017-10-29 DIAGNOSIS — R82998 Other abnormal findings in urine: Secondary | ICD-10-CM | POA: Diagnosis not present

## 2017-10-29 DIAGNOSIS — K769 Liver disease, unspecified: Secondary | ICD-10-CM | POA: Diagnosis not present

## 2017-10-29 DIAGNOSIS — D509 Iron deficiency anemia, unspecified: Secondary | ICD-10-CM | POA: Diagnosis not present

## 2017-10-29 DIAGNOSIS — N2581 Secondary hyperparathyroidism of renal origin: Secondary | ICD-10-CM | POA: Diagnosis not present

## 2017-10-29 DIAGNOSIS — E440 Moderate protein-calorie malnutrition: Secondary | ICD-10-CM | POA: Diagnosis not present

## 2017-10-29 DIAGNOSIS — R17 Unspecified jaundice: Secondary | ICD-10-CM | POA: Diagnosis not present

## 2017-10-29 DIAGNOSIS — Z79899 Other long term (current) drug therapy: Secondary | ICD-10-CM | POA: Diagnosis not present

## 2017-10-29 DIAGNOSIS — D631 Anemia in chronic kidney disease: Secondary | ICD-10-CM | POA: Diagnosis not present

## 2017-10-30 DIAGNOSIS — E440 Moderate protein-calorie malnutrition: Secondary | ICD-10-CM | POA: Diagnosis not present

## 2017-10-30 DIAGNOSIS — R17 Unspecified jaundice: Secondary | ICD-10-CM | POA: Diagnosis not present

## 2017-10-30 DIAGNOSIS — D631 Anemia in chronic kidney disease: Secondary | ICD-10-CM | POA: Diagnosis not present

## 2017-10-30 DIAGNOSIS — Z79899 Other long term (current) drug therapy: Secondary | ICD-10-CM | POA: Diagnosis not present

## 2017-10-30 DIAGNOSIS — D509 Iron deficiency anemia, unspecified: Secondary | ICD-10-CM | POA: Diagnosis not present

## 2017-10-30 DIAGNOSIS — K769 Liver disease, unspecified: Secondary | ICD-10-CM | POA: Diagnosis not present

## 2017-10-30 DIAGNOSIS — N2581 Secondary hyperparathyroidism of renal origin: Secondary | ICD-10-CM | POA: Diagnosis not present

## 2017-10-30 DIAGNOSIS — N186 End stage renal disease: Secondary | ICD-10-CM | POA: Diagnosis not present

## 2017-10-30 DIAGNOSIS — R82998 Other abnormal findings in urine: Secondary | ICD-10-CM | POA: Diagnosis not present

## 2017-10-31 DIAGNOSIS — K769 Liver disease, unspecified: Secondary | ICD-10-CM | POA: Diagnosis not present

## 2017-10-31 DIAGNOSIS — Z79899 Other long term (current) drug therapy: Secondary | ICD-10-CM | POA: Diagnosis not present

## 2017-10-31 DIAGNOSIS — N2581 Secondary hyperparathyroidism of renal origin: Secondary | ICD-10-CM | POA: Diagnosis not present

## 2017-10-31 DIAGNOSIS — D509 Iron deficiency anemia, unspecified: Secondary | ICD-10-CM | POA: Diagnosis not present

## 2017-10-31 DIAGNOSIS — E440 Moderate protein-calorie malnutrition: Secondary | ICD-10-CM | POA: Diagnosis not present

## 2017-10-31 DIAGNOSIS — R82998 Other abnormal findings in urine: Secondary | ICD-10-CM | POA: Diagnosis not present

## 2017-10-31 DIAGNOSIS — N186 End stage renal disease: Secondary | ICD-10-CM | POA: Diagnosis not present

## 2017-10-31 DIAGNOSIS — D631 Anemia in chronic kidney disease: Secondary | ICD-10-CM | POA: Diagnosis not present

## 2017-10-31 DIAGNOSIS — R17 Unspecified jaundice: Secondary | ICD-10-CM | POA: Diagnosis not present

## 2017-11-01 DIAGNOSIS — D631 Anemia in chronic kidney disease: Secondary | ICD-10-CM | POA: Diagnosis not present

## 2017-11-01 DIAGNOSIS — K769 Liver disease, unspecified: Secondary | ICD-10-CM | POA: Diagnosis not present

## 2017-11-01 DIAGNOSIS — R82998 Other abnormal findings in urine: Secondary | ICD-10-CM | POA: Diagnosis not present

## 2017-11-01 DIAGNOSIS — D509 Iron deficiency anemia, unspecified: Secondary | ICD-10-CM | POA: Diagnosis not present

## 2017-11-01 DIAGNOSIS — N2581 Secondary hyperparathyroidism of renal origin: Secondary | ICD-10-CM | POA: Diagnosis not present

## 2017-11-01 DIAGNOSIS — N186 End stage renal disease: Secondary | ICD-10-CM | POA: Diagnosis not present

## 2017-11-01 DIAGNOSIS — E440 Moderate protein-calorie malnutrition: Secondary | ICD-10-CM | POA: Diagnosis not present

## 2017-11-01 DIAGNOSIS — Z79899 Other long term (current) drug therapy: Secondary | ICD-10-CM | POA: Diagnosis not present

## 2017-11-01 DIAGNOSIS — R17 Unspecified jaundice: Secondary | ICD-10-CM | POA: Diagnosis not present

## 2017-11-02 DIAGNOSIS — E440 Moderate protein-calorie malnutrition: Secondary | ICD-10-CM | POA: Diagnosis not present

## 2017-11-02 DIAGNOSIS — D631 Anemia in chronic kidney disease: Secondary | ICD-10-CM | POA: Diagnosis not present

## 2017-11-02 DIAGNOSIS — N186 End stage renal disease: Secondary | ICD-10-CM | POA: Diagnosis not present

## 2017-11-02 DIAGNOSIS — Z79899 Other long term (current) drug therapy: Secondary | ICD-10-CM | POA: Diagnosis not present

## 2017-11-02 DIAGNOSIS — R17 Unspecified jaundice: Secondary | ICD-10-CM | POA: Diagnosis not present

## 2017-11-02 DIAGNOSIS — N2581 Secondary hyperparathyroidism of renal origin: Secondary | ICD-10-CM | POA: Diagnosis not present

## 2017-11-02 DIAGNOSIS — K769 Liver disease, unspecified: Secondary | ICD-10-CM | POA: Diagnosis not present

## 2017-11-02 DIAGNOSIS — D509 Iron deficiency anemia, unspecified: Secondary | ICD-10-CM | POA: Diagnosis not present

## 2017-11-02 DIAGNOSIS — R82998 Other abnormal findings in urine: Secondary | ICD-10-CM | POA: Diagnosis not present

## 2017-11-03 DIAGNOSIS — E440 Moderate protein-calorie malnutrition: Secondary | ICD-10-CM | POA: Diagnosis not present

## 2017-11-03 DIAGNOSIS — D509 Iron deficiency anemia, unspecified: Secondary | ICD-10-CM | POA: Diagnosis not present

## 2017-11-03 DIAGNOSIS — N186 End stage renal disease: Secondary | ICD-10-CM | POA: Diagnosis not present

## 2017-11-03 DIAGNOSIS — R17 Unspecified jaundice: Secondary | ICD-10-CM | POA: Diagnosis not present

## 2017-11-03 DIAGNOSIS — N2581 Secondary hyperparathyroidism of renal origin: Secondary | ICD-10-CM | POA: Diagnosis not present

## 2017-11-03 DIAGNOSIS — R82998 Other abnormal findings in urine: Secondary | ICD-10-CM | POA: Diagnosis not present

## 2017-11-03 DIAGNOSIS — K769 Liver disease, unspecified: Secondary | ICD-10-CM | POA: Diagnosis not present

## 2017-11-03 DIAGNOSIS — Z79899 Other long term (current) drug therapy: Secondary | ICD-10-CM | POA: Diagnosis not present

## 2017-11-03 DIAGNOSIS — D631 Anemia in chronic kidney disease: Secondary | ICD-10-CM | POA: Diagnosis not present

## 2017-11-04 DIAGNOSIS — K769 Liver disease, unspecified: Secondary | ICD-10-CM | POA: Diagnosis not present

## 2017-11-04 DIAGNOSIS — D509 Iron deficiency anemia, unspecified: Secondary | ICD-10-CM | POA: Diagnosis not present

## 2017-11-04 DIAGNOSIS — D631 Anemia in chronic kidney disease: Secondary | ICD-10-CM | POA: Diagnosis not present

## 2017-11-04 DIAGNOSIS — N2581 Secondary hyperparathyroidism of renal origin: Secondary | ICD-10-CM | POA: Diagnosis not present

## 2017-11-04 DIAGNOSIS — N186 End stage renal disease: Secondary | ICD-10-CM | POA: Diagnosis not present

## 2017-11-04 DIAGNOSIS — R82998 Other abnormal findings in urine: Secondary | ICD-10-CM | POA: Diagnosis not present

## 2017-11-04 DIAGNOSIS — R17 Unspecified jaundice: Secondary | ICD-10-CM | POA: Diagnosis not present

## 2017-11-04 DIAGNOSIS — E440 Moderate protein-calorie malnutrition: Secondary | ICD-10-CM | POA: Diagnosis not present

## 2017-11-04 DIAGNOSIS — Z79899 Other long term (current) drug therapy: Secondary | ICD-10-CM | POA: Diagnosis not present

## 2017-11-05 DIAGNOSIS — Z79899 Other long term (current) drug therapy: Secondary | ICD-10-CM | POA: Diagnosis not present

## 2017-11-05 DIAGNOSIS — K769 Liver disease, unspecified: Secondary | ICD-10-CM | POA: Diagnosis not present

## 2017-11-05 DIAGNOSIS — E440 Moderate protein-calorie malnutrition: Secondary | ICD-10-CM | POA: Diagnosis not present

## 2017-11-05 DIAGNOSIS — R82998 Other abnormal findings in urine: Secondary | ICD-10-CM | POA: Diagnosis not present

## 2017-11-05 DIAGNOSIS — D631 Anemia in chronic kidney disease: Secondary | ICD-10-CM | POA: Diagnosis not present

## 2017-11-05 DIAGNOSIS — N2581 Secondary hyperparathyroidism of renal origin: Secondary | ICD-10-CM | POA: Diagnosis not present

## 2017-11-05 DIAGNOSIS — N186 End stage renal disease: Secondary | ICD-10-CM | POA: Diagnosis not present

## 2017-11-05 DIAGNOSIS — D509 Iron deficiency anemia, unspecified: Secondary | ICD-10-CM | POA: Diagnosis not present

## 2017-11-05 DIAGNOSIS — R17 Unspecified jaundice: Secondary | ICD-10-CM | POA: Diagnosis not present

## 2017-11-06 DIAGNOSIS — D631 Anemia in chronic kidney disease: Secondary | ICD-10-CM | POA: Diagnosis not present

## 2017-11-06 DIAGNOSIS — N186 End stage renal disease: Secondary | ICD-10-CM | POA: Diagnosis not present

## 2017-11-06 DIAGNOSIS — K769 Liver disease, unspecified: Secondary | ICD-10-CM | POA: Diagnosis not present

## 2017-11-06 DIAGNOSIS — E440 Moderate protein-calorie malnutrition: Secondary | ICD-10-CM | POA: Diagnosis not present

## 2017-11-06 DIAGNOSIS — D509 Iron deficiency anemia, unspecified: Secondary | ICD-10-CM | POA: Diagnosis not present

## 2017-11-06 DIAGNOSIS — R17 Unspecified jaundice: Secondary | ICD-10-CM | POA: Diagnosis not present

## 2017-11-06 DIAGNOSIS — N2581 Secondary hyperparathyroidism of renal origin: Secondary | ICD-10-CM | POA: Diagnosis not present

## 2017-11-06 DIAGNOSIS — R82998 Other abnormal findings in urine: Secondary | ICD-10-CM | POA: Diagnosis not present

## 2017-11-06 DIAGNOSIS — Z79899 Other long term (current) drug therapy: Secondary | ICD-10-CM | POA: Diagnosis not present

## 2017-11-07 DIAGNOSIS — N186 End stage renal disease: Secondary | ICD-10-CM | POA: Diagnosis not present

## 2017-11-07 DIAGNOSIS — D509 Iron deficiency anemia, unspecified: Secondary | ICD-10-CM | POA: Diagnosis not present

## 2017-11-07 DIAGNOSIS — Z79899 Other long term (current) drug therapy: Secondary | ICD-10-CM | POA: Diagnosis not present

## 2017-11-07 DIAGNOSIS — E440 Moderate protein-calorie malnutrition: Secondary | ICD-10-CM | POA: Diagnosis not present

## 2017-11-07 DIAGNOSIS — K769 Liver disease, unspecified: Secondary | ICD-10-CM | POA: Diagnosis not present

## 2017-11-07 DIAGNOSIS — R17 Unspecified jaundice: Secondary | ICD-10-CM | POA: Diagnosis not present

## 2017-11-07 DIAGNOSIS — N2581 Secondary hyperparathyroidism of renal origin: Secondary | ICD-10-CM | POA: Diagnosis not present

## 2017-11-07 DIAGNOSIS — R82998 Other abnormal findings in urine: Secondary | ICD-10-CM | POA: Diagnosis not present

## 2017-11-07 DIAGNOSIS — D631 Anemia in chronic kidney disease: Secondary | ICD-10-CM | POA: Diagnosis not present

## 2017-11-08 DIAGNOSIS — R17 Unspecified jaundice: Secondary | ICD-10-CM | POA: Diagnosis not present

## 2017-11-08 DIAGNOSIS — D509 Iron deficiency anemia, unspecified: Secondary | ICD-10-CM | POA: Diagnosis not present

## 2017-11-08 DIAGNOSIS — K769 Liver disease, unspecified: Secondary | ICD-10-CM | POA: Diagnosis not present

## 2017-11-08 DIAGNOSIS — D631 Anemia in chronic kidney disease: Secondary | ICD-10-CM | POA: Diagnosis not present

## 2017-11-08 DIAGNOSIS — Z79899 Other long term (current) drug therapy: Secondary | ICD-10-CM | POA: Diagnosis not present

## 2017-11-08 DIAGNOSIS — N186 End stage renal disease: Secondary | ICD-10-CM | POA: Diagnosis not present

## 2017-11-08 DIAGNOSIS — N2581 Secondary hyperparathyroidism of renal origin: Secondary | ICD-10-CM | POA: Diagnosis not present

## 2017-11-08 DIAGNOSIS — E440 Moderate protein-calorie malnutrition: Secondary | ICD-10-CM | POA: Diagnosis not present

## 2017-11-08 DIAGNOSIS — R82998 Other abnormal findings in urine: Secondary | ICD-10-CM | POA: Diagnosis not present

## 2017-11-09 DIAGNOSIS — K769 Liver disease, unspecified: Secondary | ICD-10-CM | POA: Diagnosis not present

## 2017-11-09 DIAGNOSIS — E440 Moderate protein-calorie malnutrition: Secondary | ICD-10-CM | POA: Diagnosis not present

## 2017-11-09 DIAGNOSIS — N2581 Secondary hyperparathyroidism of renal origin: Secondary | ICD-10-CM | POA: Diagnosis not present

## 2017-11-09 DIAGNOSIS — D509 Iron deficiency anemia, unspecified: Secondary | ICD-10-CM | POA: Diagnosis not present

## 2017-11-09 DIAGNOSIS — Z79899 Other long term (current) drug therapy: Secondary | ICD-10-CM | POA: Diagnosis not present

## 2017-11-09 DIAGNOSIS — R17 Unspecified jaundice: Secondary | ICD-10-CM | POA: Diagnosis not present

## 2017-11-09 DIAGNOSIS — D631 Anemia in chronic kidney disease: Secondary | ICD-10-CM | POA: Diagnosis not present

## 2017-11-09 DIAGNOSIS — N186 End stage renal disease: Secondary | ICD-10-CM | POA: Diagnosis not present

## 2017-11-09 DIAGNOSIS — R82998 Other abnormal findings in urine: Secondary | ICD-10-CM | POA: Diagnosis not present

## 2017-11-10 DIAGNOSIS — K769 Liver disease, unspecified: Secondary | ICD-10-CM | POA: Diagnosis not present

## 2017-11-10 DIAGNOSIS — E440 Moderate protein-calorie malnutrition: Secondary | ICD-10-CM | POA: Diagnosis not present

## 2017-11-10 DIAGNOSIS — Z79899 Other long term (current) drug therapy: Secondary | ICD-10-CM | POA: Diagnosis not present

## 2017-11-10 DIAGNOSIS — R17 Unspecified jaundice: Secondary | ICD-10-CM | POA: Diagnosis not present

## 2017-11-10 DIAGNOSIS — D509 Iron deficiency anemia, unspecified: Secondary | ICD-10-CM | POA: Diagnosis not present

## 2017-11-10 DIAGNOSIS — D631 Anemia in chronic kidney disease: Secondary | ICD-10-CM | POA: Diagnosis not present

## 2017-11-10 DIAGNOSIS — N186 End stage renal disease: Secondary | ICD-10-CM | POA: Diagnosis not present

## 2017-11-10 DIAGNOSIS — N2581 Secondary hyperparathyroidism of renal origin: Secondary | ICD-10-CM | POA: Diagnosis not present

## 2017-11-10 DIAGNOSIS — R82998 Other abnormal findings in urine: Secondary | ICD-10-CM | POA: Diagnosis not present

## 2017-11-11 DIAGNOSIS — K769 Liver disease, unspecified: Secondary | ICD-10-CM | POA: Diagnosis not present

## 2017-11-11 DIAGNOSIS — D631 Anemia in chronic kidney disease: Secondary | ICD-10-CM | POA: Diagnosis not present

## 2017-11-11 DIAGNOSIS — R17 Unspecified jaundice: Secondary | ICD-10-CM | POA: Diagnosis not present

## 2017-11-11 DIAGNOSIS — R82998 Other abnormal findings in urine: Secondary | ICD-10-CM | POA: Diagnosis not present

## 2017-11-11 DIAGNOSIS — N2581 Secondary hyperparathyroidism of renal origin: Secondary | ICD-10-CM | POA: Diagnosis not present

## 2017-11-11 DIAGNOSIS — Z79899 Other long term (current) drug therapy: Secondary | ICD-10-CM | POA: Diagnosis not present

## 2017-11-11 DIAGNOSIS — E440 Moderate protein-calorie malnutrition: Secondary | ICD-10-CM | POA: Diagnosis not present

## 2017-11-11 DIAGNOSIS — N186 End stage renal disease: Secondary | ICD-10-CM | POA: Diagnosis not present

## 2017-11-11 DIAGNOSIS — D509 Iron deficiency anemia, unspecified: Secondary | ICD-10-CM | POA: Diagnosis not present

## 2017-11-12 DIAGNOSIS — K769 Liver disease, unspecified: Secondary | ICD-10-CM | POA: Diagnosis not present

## 2017-11-12 DIAGNOSIS — D631 Anemia in chronic kidney disease: Secondary | ICD-10-CM | POA: Diagnosis not present

## 2017-11-12 DIAGNOSIS — D509 Iron deficiency anemia, unspecified: Secondary | ICD-10-CM | POA: Diagnosis not present

## 2017-11-12 DIAGNOSIS — E440 Moderate protein-calorie malnutrition: Secondary | ICD-10-CM | POA: Diagnosis not present

## 2017-11-12 DIAGNOSIS — N186 End stage renal disease: Secondary | ICD-10-CM | POA: Diagnosis not present

## 2017-11-12 DIAGNOSIS — R17 Unspecified jaundice: Secondary | ICD-10-CM | POA: Diagnosis not present

## 2017-11-12 DIAGNOSIS — N2581 Secondary hyperparathyroidism of renal origin: Secondary | ICD-10-CM | POA: Diagnosis not present

## 2017-11-12 DIAGNOSIS — R82998 Other abnormal findings in urine: Secondary | ICD-10-CM | POA: Diagnosis not present

## 2017-11-12 DIAGNOSIS — Z79899 Other long term (current) drug therapy: Secondary | ICD-10-CM | POA: Diagnosis not present

## 2017-11-13 DIAGNOSIS — D631 Anemia in chronic kidney disease: Secondary | ICD-10-CM | POA: Diagnosis not present

## 2017-11-13 DIAGNOSIS — K769 Liver disease, unspecified: Secondary | ICD-10-CM | POA: Diagnosis not present

## 2017-11-13 DIAGNOSIS — D509 Iron deficiency anemia, unspecified: Secondary | ICD-10-CM | POA: Diagnosis not present

## 2017-11-13 DIAGNOSIS — R17 Unspecified jaundice: Secondary | ICD-10-CM | POA: Diagnosis not present

## 2017-11-13 DIAGNOSIS — N186 End stage renal disease: Secondary | ICD-10-CM | POA: Diagnosis not present

## 2017-11-13 DIAGNOSIS — N2581 Secondary hyperparathyroidism of renal origin: Secondary | ICD-10-CM | POA: Diagnosis not present

## 2017-11-13 DIAGNOSIS — R82998 Other abnormal findings in urine: Secondary | ICD-10-CM | POA: Diagnosis not present

## 2017-11-13 DIAGNOSIS — Z79899 Other long term (current) drug therapy: Secondary | ICD-10-CM | POA: Diagnosis not present

## 2017-11-13 DIAGNOSIS — E440 Moderate protein-calorie malnutrition: Secondary | ICD-10-CM | POA: Diagnosis not present

## 2017-11-14 DIAGNOSIS — D631 Anemia in chronic kidney disease: Secondary | ICD-10-CM | POA: Diagnosis not present

## 2017-11-14 DIAGNOSIS — K769 Liver disease, unspecified: Secondary | ICD-10-CM | POA: Diagnosis not present

## 2017-11-14 DIAGNOSIS — R17 Unspecified jaundice: Secondary | ICD-10-CM | POA: Diagnosis not present

## 2017-11-14 DIAGNOSIS — R82998 Other abnormal findings in urine: Secondary | ICD-10-CM | POA: Diagnosis not present

## 2017-11-14 DIAGNOSIS — D509 Iron deficiency anemia, unspecified: Secondary | ICD-10-CM | POA: Diagnosis not present

## 2017-11-14 DIAGNOSIS — E440 Moderate protein-calorie malnutrition: Secondary | ICD-10-CM | POA: Diagnosis not present

## 2017-11-14 DIAGNOSIS — N186 End stage renal disease: Secondary | ICD-10-CM | POA: Diagnosis not present

## 2017-11-14 DIAGNOSIS — Z79899 Other long term (current) drug therapy: Secondary | ICD-10-CM | POA: Diagnosis not present

## 2017-11-14 DIAGNOSIS — N2581 Secondary hyperparathyroidism of renal origin: Secondary | ICD-10-CM | POA: Diagnosis not present

## 2017-11-15 DIAGNOSIS — E440 Moderate protein-calorie malnutrition: Secondary | ICD-10-CM | POA: Diagnosis not present

## 2017-11-15 DIAGNOSIS — D631 Anemia in chronic kidney disease: Secondary | ICD-10-CM | POA: Diagnosis not present

## 2017-11-15 DIAGNOSIS — Z79899 Other long term (current) drug therapy: Secondary | ICD-10-CM | POA: Diagnosis not present

## 2017-11-15 DIAGNOSIS — N186 End stage renal disease: Secondary | ICD-10-CM | POA: Diagnosis not present

## 2017-11-15 DIAGNOSIS — D509 Iron deficiency anemia, unspecified: Secondary | ICD-10-CM | POA: Diagnosis not present

## 2017-11-15 DIAGNOSIS — R82998 Other abnormal findings in urine: Secondary | ICD-10-CM | POA: Diagnosis not present

## 2017-11-15 DIAGNOSIS — N2581 Secondary hyperparathyroidism of renal origin: Secondary | ICD-10-CM | POA: Diagnosis not present

## 2017-11-15 DIAGNOSIS — R17 Unspecified jaundice: Secondary | ICD-10-CM | POA: Diagnosis not present

## 2017-11-15 DIAGNOSIS — K769 Liver disease, unspecified: Secondary | ICD-10-CM | POA: Diagnosis not present

## 2017-11-15 NOTE — Progress Notes (Signed)
FOLLOW UP  Assessment and Plan:   Diagnoses and all orders for this visit:  Essential hypertension At goal; continue medication Monitor blood pressure at home; call if consistently over 130/80 or under 90/60 Continue DASH diet.   Reminder to go to the ER if any CP, SOB, nausea, dizziness, severe HA, changes vision/speech, left arm numbness and tingling and jaw pain.  Secondary hyperparathyroidism (CKD) Continue sevelamer Continue follow up with Dr. Moshe Cipro -     BASIC METABOLIC PANEL WITH GFR  Diabetes mellitus due to underlying condition with diabetic nephropathy, without long-term current use of insulin ([place]) Not treated by medications secondary to ESRD; A1Cs improving with diet and regular dialysis Discussed hypoglycemia symptoms, check sugars if she experiences any of these and to eat a snack and notify office of episodes Continue diet and exercise.  Reminded to get annual eye exams Perform daily foot/skin check, notify office of any concerning changes.  -     Hemoglobin A1c  ESRD (end stage renal disease) ([place]) Continue dialysis and follow up with urology -     BASIC METABOLIC PANEL WITH GFR  Anemia of chronic Renal Dz -     CBC with Differential/Platelet  Vitamin D deficiency Continue supplementation -     VITAMIN D 25 Hydroxy (Vit-D Deficiency, Fractures)  Hyperlipidemia Currently at goal with omega 3 supplementation and lifestyle Continue to encourage low cholesterol diet and exercise -     Lipid panel -     TSH  Medication management -     CBC with Differential/Platelet -     BASIC METABOLIC PANEL WITH GFR -     Hepatic function panel  Overweight (BMI 25.0-29.9) Long discussion about weight loss, diet, and exercise Recommended diet heavy in fruits and veggies and low in animal meats, cheeses, and dairy products, appropriate calorie intake Follow up in 3 months  Continue diet and meds as discussed. Further disposition pending results of labs.  Discussed med's effects and SE's.   Over 30 minutes of exam, counseling, chart review, and critical decision making was performed.   Future Appointments  Date Time Provider [place]  02/14/2018 11:00 AM Unk Pinto, MD [facility] None  02/22/2018  8:30 AM Hayden Pedro, MD [facility] None    ----------------------------------------------------------------------------------------------------------------------  HPI 76 y.o. 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 She continues to perform at home peritoneal dialysis nightly; and is followed by Dr. Moshe Cipro every two weeks. She reports she did not try melatonin which was recommended at the last visit for insomina; she reports symptoms are mild and she does not wish any further interventions.  BMI is Body mass index is 27.93 kg/m., she has been working on diet. Wt Readings from Last 3 Encounters:  11/16/17 147 lb 12.8 oz (67 kg)  08/10/17 155 lb 9.6 oz (70.6 kg)  06/15/17 158 lb (71.7 kg)   Her blood pressure has been controlled at home, today their BP is BP: 126/70  She does not currently work out related to the weather; she reports she normally would do aerobics/yoga 4 days a week. She denies chest pain, shortness of breath, dizziness.   She is not on cholesterol medication and denies myalgias. Her cholesterol is at goal. The cholesterol last visit was:   Lab Results  Component Value Date   CHOL 180 06/15/2017   HDL 58 06/15/2017   LDLCALC 93 06/15/2017   TRIG 147 06/15/2017  CHOLHDL 3.1 06/15/2017    She has been working on diet for T2DM, and denies hyperglycemia, hypoglycemia , increased appetite, nausea, paresthesia of the feet, polydipsia and polyuria. Her A1Cs have been steadily improving and she reports most recent by nephrology was 4.8. She does not currently check her blood sugars at home, denies symptoms of hypoglycemia.  Last A1C in the office was:  Lab Results  Component Value Date   HGBA1C 5.4 08/10/2017   Patient is on Vitamin D supplement but remains below goal of 70:   Lab Results  Component Value Date   VD25OH 41 08/10/2017       Current Medications:  Current Outpatient Medications on File Prior to Visit  Medication Sig  . aspirin 81 MG tablet Take 81 mg by mouth at bedtime.   Marland Kitchen b complex-vitamin c-folic acid (NEPHRO-VITE) 0.8 MG TABS tablet Take 1 tablet by mouth at bedtime.  . calcitRIOL (ROCALTROL) 0.5 MCG capsule Take 0.5 mcg by mouth every other day.  . calcium acetate (PHOSLO) 667 MG capsule Take 667 mg by mouth 3 (three) times daily with meals.   . Cholecalciferol (VITAMIN D-3 PO) Take 5,000 Units by mouth every other day.   . cinacalcet (SENSIPAR) 30 MG tablet Take 30 mg by mouth daily.  Marland Kitchen docusate sodium (COLACE) 100 MG capsule Take 100 mg by mouth 2 (two) times daily.  . Flaxseed, Linseed, (FLAXSEED OIL) 1000 MG CAPS Take 1 capsule by mouth daily.   Marland Kitchen glucose blood (ACCU-CHEK AVIVA PLUS) test strip Test once daily  . hydrALAZINE (APRESOLINE) 10 MG tablet TAKE 2 TABLETS BY MOUTH 2  TIMES DAILY. (Patient taking differently: Take 20 mg by mouth 2 (two) times daily. TAKE 2 TABLETS BY MOUTH 2  TIMES DAILY.)  . Omega-3 Fatty Acids (FISH OIL) 1200 MG CAPS Take 2 capsules by mouth daily.  . sevelamer carbonate (RENVELA) 800 MG tablet Take 1,600 mg by mouth 3 (three) times daily with meals.   . zinc gluconate 50 MG tablet Take 50 mg by mouth every morning.  . [DISCONTINUED] diltiazem (CARDIZEM) 120 MG tablet Take 120 mg by mouth 2 (two) times daily.   No current facility-administered medications on file prior to visit.      Allergies:  Allergies  Allergen Reactions  . Ace Inhibitors     unknown  . Nsaids     unknown     Medical History:  Past Medical History:  Diagnosis Date  . Anemia   . Arthritis    Osteoarthritis  . Chronic kidney disease    Chronic renal insufficiency  .  Diabetes mellitus    Pre  . Diverticulosis   . GERD (gastroesophageal reflux disease)   . Gout   . Hiatal hernia   . History of blood transfusion   . Hyperlipidemia   . Hypertension   . Osteopenia   . Pancreatic cyst 1999  . Thyroid disease    Hyperparathyroid    Family history- Reviewed and unchanged Social history- Reviewed and unchanged   Review of Systems:  Review of Systems  Constitutional: Negative for malaise/fatigue and weight loss.  HENT: Negative for hearing loss and tinnitus.   Eyes: Negative for blurred vision and double vision.  Respiratory: Negative for cough, shortness of breath and wheezing.   Cardiovascular: Negative for chest pain, palpitations, orthopnea, claudication and leg swelling.  Gastrointestinal: Negative for abdominal pain, blood in stool, constipation, diarrhea, heartburn, melena, nausea and vomiting.  Genitourinary: Negative.   Musculoskeletal: Negative for falls, joint  pain and myalgias.  Skin: Negative for rash.  Neurological: Negative for dizziness, tingling, sensory change, weakness and headaches.  Endo/Heme/Allergies: Negative for polydipsia.  Psychiatric/Behavioral: Negative for depression and memory loss. The patient has insomnia (Mild intermittent). The patient is not nervous/anxious.   All other systems reviewed and are negative.     Physical Exam: BP 126/70   Pulse 72   Temp (!) 97.5 F (36.4 C)   Ht 5\' 1"  (1.549 m)   Wt 147 lb 12.8 oz (67 kg)   SpO2 97%   BMI 27.93 kg/m  Wt Readings from Last 3 Encounters:  11/16/17 147 lb 12.8 oz (67 kg)  08/10/17 155 lb 9.6 oz (70.6 kg)  06/15/17 158 lb (71.7 kg)   General Appearance: Well nourished, in no apparent distress. Eyes: PERRLA, EOMs, conjunctiva no swelling or erythema Sinuses: No Frontal/maxillary tenderness ENT/Mouth: Ext aud canals clear, TMs without erythema, bulging. No erythema, swelling, or exudate on post pharynx.  Tonsils not swollen or erythematous. Hearing normal.   Neck: Supple, thyroid normal.  Respiratory: Respiratory effort normal, BS equal bilaterally without rales, rhonchi, wheezing or stridor.  Cardio: RRR with no MRGs. Brisk peripheral pulses without edema.  Abdomen: Soft, + BS.  Non tender, no guarding, rebound, hernias, masses. Lymphatics: Non tender without lymphadenopathy.  Musculoskeletal: Full ROM, 5/5 strength, Normal gait Skin: Warm, dry without rashes, lesions, ecchymosis. Port site on abdomen without injection or discharge.  Neuro: Cranial nerves intact. No cerebellar symptoms.  Psych: Awake and oriented X 3, normal affect, Insight and Judgment appropriate.    Izora Ribas, NP 11:19 AM [facility]

## 2017-11-16 ENCOUNTER — Encounter: Payer: Self-pay | Admitting: Adult Health

## 2017-11-16 ENCOUNTER — Ambulatory Visit (INDEPENDENT_AMBULATORY_CARE_PROVIDER_SITE_OTHER): Payer: Medicare Other | Admitting: Adult Health

## 2017-11-16 VITALS — BP 126/70 | HR 72 | Temp 97.5°F | Ht 61.0 in | Wt 147.8 lb

## 2017-11-16 DIAGNOSIS — D638 Anemia in other chronic diseases classified elsewhere: Secondary | ICD-10-CM

## 2017-11-16 DIAGNOSIS — N186 End stage renal disease: Secondary | ICD-10-CM

## 2017-11-16 DIAGNOSIS — E0821 Diabetes mellitus due to underlying condition with diabetic nephropathy: Secondary | ICD-10-CM

## 2017-11-16 DIAGNOSIS — E559 Vitamin D deficiency, unspecified: Secondary | ICD-10-CM

## 2017-11-16 DIAGNOSIS — N2581 Secondary hyperparathyroidism of renal origin: Secondary | ICD-10-CM | POA: Diagnosis not present

## 2017-11-16 DIAGNOSIS — R17 Unspecified jaundice: Secondary | ICD-10-CM | POA: Diagnosis not present

## 2017-11-16 DIAGNOSIS — E663 Overweight: Secondary | ICD-10-CM | POA: Diagnosis not present

## 2017-11-16 DIAGNOSIS — E782 Mixed hyperlipidemia: Secondary | ICD-10-CM

## 2017-11-16 DIAGNOSIS — I10 Essential (primary) hypertension: Secondary | ICD-10-CM | POA: Diagnosis not present

## 2017-11-16 DIAGNOSIS — D631 Anemia in chronic kidney disease: Secondary | ICD-10-CM | POA: Diagnosis not present

## 2017-11-16 DIAGNOSIS — Z79899 Other long term (current) drug therapy: Secondary | ICD-10-CM

## 2017-11-16 DIAGNOSIS — D509 Iron deficiency anemia, unspecified: Secondary | ICD-10-CM | POA: Diagnosis not present

## 2017-11-16 DIAGNOSIS — E440 Moderate protein-calorie malnutrition: Secondary | ICD-10-CM | POA: Diagnosis not present

## 2017-11-16 DIAGNOSIS — K769 Liver disease, unspecified: Secondary | ICD-10-CM | POA: Diagnosis not present

## 2017-11-16 DIAGNOSIS — R82998 Other abnormal findings in urine: Secondary | ICD-10-CM | POA: Diagnosis not present

## 2017-11-16 MED ORDER — LABETALOL HCL 200 MG PO TABS: 200.0000 mg | ORAL_TABLET | Freq: Two times a day (BID) | ORAL | 0 refills | Status: DC

## 2017-11-16 MED ORDER — ALLOPURINOL 300 MG PO TABS: ORAL_TABLET | ORAL | 3 refills | Status: DC

## 2017-11-16 NOTE — Patient Instructions (Signed)
Monitor your blood pressure at home. Go to the ER if any CP, SOB, nausea, dizziness, severe HA, changes vision/speech  Goal BP:  For patients younger than 60: Goal BP < 140/90. For patients 60 and older: Goal BP < 150/90. For patients with diabetes: Goal BP < 140/90. Your most recent BP: BP: 126/70   Take your medications faithfully as instructed. Maintain a healthy weight. Get at least 150 minutes of aerobic exercise per week. Minimize salt intake. Minimize alcohol intake  DASH Eating Plan DASH stands for "Dietary Approaches to Stop Hypertension." The DASH eating plan is a healthy eating plan that has been shown to reduce high blood pressure (hypertension). Additional health benefits may include reducing the risk of type 2 diabetes mellitus, heart disease, and stroke. The DASH eating plan may also help with weight loss. WHAT DO I NEED TO KNOW ABOUT THE DASH EATING PLAN? For the DASH eating plan, you will follow these general guidelines:  Choose foods with a percent daily value for sodium of less than 5% (as listed on the food label).  Use salt-free seasonings or herbs instead of table salt or sea salt.  Check with your health care provider or pharmacist before using salt substitutes.  Eat lower-sodium products, often labeled as "lower sodium" or "no salt added."  Eat fresh foods.  Eat more vegetables, fruits, and low-fat dairy products.  Choose whole grains. Look for the word "whole" as the first word in the ingredient list.  Choose fish and skinless chicken or [place] more often than red meat. Limit fish, poultry, and meat to 6 oz (170 g) each day.  Limit sweets, desserts, sugars, and sugary drinks.  Choose heart-healthy fats.  Limit cheese to 1 oz (28 g) per day.  Eat more home-cooked food and less restaurant, buffet, and fast food.  Limit fried foods.  Cook foods using methods other than frying.  Limit canned vegetables. If you do use them, rinse them well to  decrease the sodium.  When eating at a restaurant, ask that your food be prepared with less salt, or no salt if possible. WHAT FOODS CAN I EAT? Seek help from a dietitian for individual calorie needs. Grains Whole grain or whole wheat bread. Brown rice. Whole grain or whole wheat pasta. Quinoa, bulgur, and whole grain cereals. Low-sodium cereals. Corn or whole wheat flour tortillas. Whole grain cornbread. Whole grain crackers. Low-sodium crackers. Vegetables Fresh or frozen vegetables (raw, steamed, roasted, or grilled). Low-sodium or reduced-sodium tomato and vegetable juices. Low-sodium or reduced-sodium tomato sauce and paste. Low-sodium or reduced-sodium canned vegetables.  Fruits All fresh, canned (in natural juice), or frozen fruits. Meat and Other Protein Products Ground beef (85% or leaner), grass-fed beef, or beef trimmed of fat. Skinless chicken or [place]. Ground chicken or [place]. Pork trimmed of fat. All fish and seafood. Eggs. Dried beans, peas, or lentils. Unsalted nuts and seeds. Unsalted canned beans. Dairy Low-fat dairy products, such as skim or 1% milk, 2% or reduced-fat cheeses, low-fat ricotta or cottage cheese, or plain low-fat yogurt. Low-sodium or reduced-sodium cheeses. Fats and Oils Tub margarines without trans fats. Light or reduced-fat mayonnaise and salad dressings (reduced sodium). Avocado. Safflower, olive, or canola oils. Natural peanut or almond butter. Other Unsalted popcorn and pretzels. The items listed above may not be a complete list of recommended foods or beverages. Contact your dietitian for more options. WHAT FOODS ARE NOT RECOMMENDED? Grains White bread. White pasta. White rice. Refined cornbread. Bagels and croissants. Crackers that contain trans  fat. Vegetables Creamed or fried vegetables. Vegetables in a cheese sauce. Regular canned vegetables. Regular canned tomato sauce and paste. Regular tomato and vegetable juices. Fruits Dried fruits. Canned  fruit in light or heavy syrup. Fruit juice. Meat and Other Protein Products Fatty cuts of meat. Ribs, chicken wings, bacon, sausage, bologna, salami, chitterlings, fatback, hot dogs, bratwurst, and packaged luncheon meats. Salted nuts and seeds. Canned beans with salt. Dairy Whole or 2% milk, cream, half-and-half, and cream cheese. Whole-fat or sweetened yogurt. Full-fat cheeses or blue cheese. Nondairy creamers and whipped toppings. Processed cheese, cheese spreads, or cheese curds. Condiments Onion and garlic salt, seasoned salt, table salt, and sea salt. Canned and packaged gravies. Worcestershire sauce. Tartar sauce. Barbecue sauce. Teriyaki sauce. Soy sauce, including reduced sodium. Steak sauce. Fish sauce. Oyster sauce. Cocktail sauce. Horseradish. Ketchup and mustard. Meat flavorings and tenderizers. Bouillon cubes. Hot sauce. Tabasco sauce. Marinades. Taco seasonings. Relishes. Fats and Oils Butter, stick margarine, lard, shortening, ghee, and bacon fat. Coconut, palm kernel, or palm oils. Regular salad dressings. Other Pickles and olives. Salted popcorn and pretzels. The items listed above may not be a complete list of foods and beverages to avoid. Contact your dietitian for more information. WHERE CAN I FIND MORE INFORMATION? National [employer], [employer], and [employer] Institute: travelstabloid.com Document Released: 10/26/2011 Document Revised: 03/23/2014 Document Reviewed: 09/10/2013 [facility] Patient Information 2015 [place], [place]. This information is not intended to replace advice given to you by your health care provider. Make sure you discuss any questions you have with your health care provider.

## 2017-11-17 DIAGNOSIS — N2581 Secondary hyperparathyroidism of renal origin: Secondary | ICD-10-CM | POA: Diagnosis not present

## 2017-11-17 DIAGNOSIS — R17 Unspecified jaundice: Secondary | ICD-10-CM | POA: Diagnosis not present

## 2017-11-17 DIAGNOSIS — Z79899 Other long term (current) drug therapy: Secondary | ICD-10-CM | POA: Diagnosis not present

## 2017-11-17 DIAGNOSIS — D631 Anemia in chronic kidney disease: Secondary | ICD-10-CM | POA: Diagnosis not present

## 2017-11-17 DIAGNOSIS — D509 Iron deficiency anemia, unspecified: Secondary | ICD-10-CM | POA: Diagnosis not present

## 2017-11-17 DIAGNOSIS — E440 Moderate protein-calorie malnutrition: Secondary | ICD-10-CM | POA: Diagnosis not present

## 2017-11-17 DIAGNOSIS — R82998 Other abnormal findings in urine: Secondary | ICD-10-CM | POA: Diagnosis not present

## 2017-11-17 DIAGNOSIS — N186 End stage renal disease: Secondary | ICD-10-CM | POA: Diagnosis not present

## 2017-11-17 DIAGNOSIS — K769 Liver disease, unspecified: Secondary | ICD-10-CM | POA: Diagnosis not present

## 2017-11-17 LAB — HEPATIC FUNCTION PANEL
AG RATIO: 1.4 (calc) (ref 1.0–2.5)
ALT: 16 U/L (ref 6–29)
AST: 18 U/L (ref 10–35)
Albumin: 3.7 g/dL (ref 3.6–5.1)
Alkaline phosphatase (APISO): 169 U/L — ABNORMAL HIGH (ref 33–130)
BILIRUBIN DIRECT: 0.1 mg/dL (ref 0.0–0.2)
BILIRUBIN INDIRECT: 0.3 mg/dL (ref 0.2–1.2)
GLOBULIN: 2.6 g/dL (ref 1.9–3.7)
Total Bilirubin: 0.4 mg/dL (ref 0.2–1.2)
Total Protein: 6.3 g/dL (ref 6.1–8.1)

## 2017-11-17 LAB — CBC WITH DIFFERENTIAL/PLATELET
BASOS PCT: 0.6 %
Basophils Absolute: 55 cells/uL (ref 0–200)
EOS ABS: 255 {cells}/uL (ref 15–500)
Eosinophils Relative: 2.8 %
HCT: 30.1 % — ABNORMAL LOW (ref 35.0–45.0)
HEMOGLOBIN: 9.6 g/dL — AB (ref 11.7–15.5)
Lymphs Abs: 2175 cells/uL (ref 850–3900)
MCH: 29.5 pg (ref 27.0–33.0)
MCHC: 31.9 g/dL — ABNORMAL LOW (ref 32.0–36.0)
MCV: 92.6 fL (ref 80.0–100.0)
MPV: 10.1 fL (ref 7.5–12.5)
Monocytes Relative: 6.7 %
NEUTROS ABS: 6006 {cells}/uL (ref 1500–7800)
Neutrophils Relative %: 66 %
Platelets: 235 10*3/uL (ref 140–400)
RBC: 3.25 10*6/uL — ABNORMAL LOW (ref 3.80–5.10)
RDW: 15 % (ref 11.0–15.0)
Total Lymphocyte: 23.9 %
WBC: 9.1 10*3/uL (ref 3.8–10.8)
WBCMIX: 610 {cells}/uL (ref 200–950)

## 2017-11-17 LAB — LIPID PANEL
CHOL/HDL RATIO: 3.9 (calc) (ref <5.0)
Cholesterol: 207 mg/dL — ABNORMAL HIGH (ref <200)
HDL: 53 mg/dL (ref >50)
LDL Cholesterol (Calc): 121 mg/dL (calc) — ABNORMAL HIGH
NON-HDL CHOLESTEROL (CALC): 154 mg/dL — AB (ref <130)
Triglycerides: 221 mg/dL — ABNORMAL HIGH (ref <150)

## 2017-11-17 LAB — BASIC METABOLIC PANEL WITH GFR
BUN / CREAT RATIO: 6 (calc) (ref 6–22)
BUN: 74 mg/dL — AB (ref 7–25)
CO2: 29 mmol/L (ref 20–32)
CREATININE: 11.47 mg/dL — AB (ref 0.60–0.93)
Calcium: 10.2 mg/dL (ref 8.6–10.4)
Chloride: 97 mmol/L — ABNORMAL LOW (ref 98–110)
GFR, EST NON AFRICAN AMERICAN: 3 mL/min/{1.73_m2} — AB (ref >=60)
Glucose, Bld: 134 mg/dL — ABNORMAL HIGH (ref 65–99)
Potassium: 4 mmol/L (ref 3.5–5.3)
Sodium: 141 mmol/L (ref 135–146)

## 2017-11-17 LAB — VITAMIN D 25 HYDROXY (VIT D DEFICIENCY, FRACTURES): VIT D 25 HYDROXY: 38 ng/mL (ref 30–100)

## 2017-11-17 LAB — HEMOGLOBIN A1C
HEMOGLOBIN A1C: 6.9 %{Hb} — AB (ref <5.7)
Mean Plasma Glucose: 151 (calc)
eAG (mmol/L): 8.4 (calc)

## 2017-11-17 LAB — TSH: TSH: 2.24 mIU/L (ref 0.40–4.50)

## 2017-11-18 DIAGNOSIS — Z79899 Other long term (current) drug therapy: Secondary | ICD-10-CM | POA: Diagnosis not present

## 2017-11-18 DIAGNOSIS — K769 Liver disease, unspecified: Secondary | ICD-10-CM | POA: Diagnosis not present

## 2017-11-18 DIAGNOSIS — E440 Moderate protein-calorie malnutrition: Secondary | ICD-10-CM | POA: Diagnosis not present

## 2017-11-18 DIAGNOSIS — D631 Anemia in chronic kidney disease: Secondary | ICD-10-CM | POA: Diagnosis not present

## 2017-11-18 DIAGNOSIS — R82998 Other abnormal findings in urine: Secondary | ICD-10-CM | POA: Diagnosis not present

## 2017-11-18 DIAGNOSIS — R17 Unspecified jaundice: Secondary | ICD-10-CM | POA: Diagnosis not present

## 2017-11-18 DIAGNOSIS — N2581 Secondary hyperparathyroidism of renal origin: Secondary | ICD-10-CM | POA: Diagnosis not present

## 2017-11-18 DIAGNOSIS — D509 Iron deficiency anemia, unspecified: Secondary | ICD-10-CM | POA: Diagnosis not present

## 2017-11-18 DIAGNOSIS — N186 End stage renal disease: Secondary | ICD-10-CM | POA: Diagnosis not present

## 2017-11-19 DIAGNOSIS — N2581 Secondary hyperparathyroidism of renal origin: Secondary | ICD-10-CM | POA: Diagnosis not present

## 2017-11-19 DIAGNOSIS — R82998 Other abnormal findings in urine: Secondary | ICD-10-CM | POA: Diagnosis not present

## 2017-11-19 DIAGNOSIS — K769 Liver disease, unspecified: Secondary | ICD-10-CM | POA: Diagnosis not present

## 2017-11-19 DIAGNOSIS — Z992 Dependence on renal dialysis: Secondary | ICD-10-CM | POA: Diagnosis not present

## 2017-11-19 DIAGNOSIS — D509 Iron deficiency anemia, unspecified: Secondary | ICD-10-CM | POA: Diagnosis not present

## 2017-11-19 DIAGNOSIS — E440 Moderate protein-calorie malnutrition: Secondary | ICD-10-CM | POA: Diagnosis not present

## 2017-11-19 DIAGNOSIS — N186 End stage renal disease: Secondary | ICD-10-CM | POA: Diagnosis not present

## 2017-11-19 DIAGNOSIS — Z79899 Other long term (current) drug therapy: Secondary | ICD-10-CM | POA: Diagnosis not present

## 2017-11-19 DIAGNOSIS — E1122 Type 2 diabetes mellitus with diabetic chronic kidney disease: Secondary | ICD-10-CM | POA: Diagnosis not present

## 2017-11-19 DIAGNOSIS — D631 Anemia in chronic kidney disease: Secondary | ICD-10-CM | POA: Diagnosis not present

## 2017-11-19 DIAGNOSIS — R17 Unspecified jaundice: Secondary | ICD-10-CM | POA: Diagnosis not present

## 2017-11-20 DIAGNOSIS — E7849 Other hyperlipidemia: Secondary | ICD-10-CM | POA: Diagnosis not present

## 2017-11-20 DIAGNOSIS — D509 Iron deficiency anemia, unspecified: Secondary | ICD-10-CM | POA: Diagnosis not present

## 2017-11-20 DIAGNOSIS — N2581 Secondary hyperparathyroidism of renal origin: Secondary | ICD-10-CM | POA: Diagnosis not present

## 2017-11-20 DIAGNOSIS — N2589 Other disorders resulting from impaired renal tubular function: Secondary | ICD-10-CM | POA: Diagnosis not present

## 2017-11-20 DIAGNOSIS — Z4932 Encounter for adequacy testing for peritoneal dialysis: Secondary | ICD-10-CM | POA: Diagnosis not present

## 2017-11-20 DIAGNOSIS — D513 Other dietary vitamin B12 deficiency anemia: Secondary | ICD-10-CM | POA: Diagnosis not present

## 2017-11-20 DIAGNOSIS — E119 Type 2 diabetes mellitus without complications: Secondary | ICD-10-CM | POA: Diagnosis not present

## 2017-11-20 DIAGNOSIS — N186 End stage renal disease: Secondary | ICD-10-CM | POA: Diagnosis not present

## 2017-11-20 DIAGNOSIS — D631 Anemia in chronic kidney disease: Secondary | ICD-10-CM | POA: Diagnosis not present

## 2017-11-20 DIAGNOSIS — E440 Moderate protein-calorie malnutrition: Secondary | ICD-10-CM | POA: Diagnosis not present

## 2017-11-21 DIAGNOSIS — D513 Other dietary vitamin B12 deficiency anemia: Secondary | ICD-10-CM | POA: Diagnosis not present

## 2017-11-21 DIAGNOSIS — E119 Type 2 diabetes mellitus without complications: Secondary | ICD-10-CM | POA: Diagnosis not present

## 2017-11-21 DIAGNOSIS — N186 End stage renal disease: Secondary | ICD-10-CM | POA: Diagnosis not present

## 2017-11-21 DIAGNOSIS — N2581 Secondary hyperparathyroidism of renal origin: Secondary | ICD-10-CM | POA: Diagnosis not present

## 2017-11-21 DIAGNOSIS — D631 Anemia in chronic kidney disease: Secondary | ICD-10-CM | POA: Diagnosis not present

## 2017-11-21 DIAGNOSIS — E440 Moderate protein-calorie malnutrition: Secondary | ICD-10-CM | POA: Diagnosis not present

## 2017-11-21 DIAGNOSIS — Z4932 Encounter for adequacy testing for peritoneal dialysis: Secondary | ICD-10-CM | POA: Diagnosis not present

## 2017-11-21 DIAGNOSIS — N2589 Other disorders resulting from impaired renal tubular function: Secondary | ICD-10-CM | POA: Diagnosis not present

## 2017-11-21 DIAGNOSIS — E7849 Other hyperlipidemia: Secondary | ICD-10-CM | POA: Diagnosis not present

## 2017-11-21 DIAGNOSIS — D509 Iron deficiency anemia, unspecified: Secondary | ICD-10-CM | POA: Diagnosis not present

## 2017-11-22 DIAGNOSIS — E7849 Other hyperlipidemia: Secondary | ICD-10-CM | POA: Diagnosis not present

## 2017-11-22 DIAGNOSIS — N2581 Secondary hyperparathyroidism of renal origin: Secondary | ICD-10-CM | POA: Diagnosis not present

## 2017-11-22 DIAGNOSIS — E440 Moderate protein-calorie malnutrition: Secondary | ICD-10-CM | POA: Diagnosis not present

## 2017-11-22 DIAGNOSIS — N186 End stage renal disease: Secondary | ICD-10-CM | POA: Diagnosis not present

## 2017-11-22 DIAGNOSIS — E119 Type 2 diabetes mellitus without complications: Secondary | ICD-10-CM | POA: Diagnosis not present

## 2017-11-22 DIAGNOSIS — D513 Other dietary vitamin B12 deficiency anemia: Secondary | ICD-10-CM | POA: Diagnosis not present

## 2017-11-22 DIAGNOSIS — D631 Anemia in chronic kidney disease: Secondary | ICD-10-CM | POA: Diagnosis not present

## 2017-11-22 DIAGNOSIS — Z4932 Encounter for adequacy testing for peritoneal dialysis: Secondary | ICD-10-CM | POA: Diagnosis not present

## 2017-11-22 DIAGNOSIS — N2589 Other disorders resulting from impaired renal tubular function: Secondary | ICD-10-CM | POA: Diagnosis not present

## 2017-11-22 DIAGNOSIS — D509 Iron deficiency anemia, unspecified: Secondary | ICD-10-CM | POA: Diagnosis not present

## 2017-11-23 DIAGNOSIS — E7849 Other hyperlipidemia: Secondary | ICD-10-CM | POA: Diagnosis not present

## 2017-11-23 DIAGNOSIS — E119 Type 2 diabetes mellitus without complications: Secondary | ICD-10-CM | POA: Diagnosis not present

## 2017-11-23 DIAGNOSIS — D509 Iron deficiency anemia, unspecified: Secondary | ICD-10-CM | POA: Diagnosis not present

## 2017-11-23 DIAGNOSIS — E440 Moderate protein-calorie malnutrition: Secondary | ICD-10-CM | POA: Diagnosis not present

## 2017-11-23 DIAGNOSIS — D513 Other dietary vitamin B12 deficiency anemia: Secondary | ICD-10-CM | POA: Diagnosis not present

## 2017-11-23 DIAGNOSIS — D631 Anemia in chronic kidney disease: Secondary | ICD-10-CM | POA: Diagnosis not present

## 2017-11-23 DIAGNOSIS — N2589 Other disorders resulting from impaired renal tubular function: Secondary | ICD-10-CM | POA: Diagnosis not present

## 2017-11-23 DIAGNOSIS — N2581 Secondary hyperparathyroidism of renal origin: Secondary | ICD-10-CM | POA: Diagnosis not present

## 2017-11-23 DIAGNOSIS — Z4932 Encounter for adequacy testing for peritoneal dialysis: Secondary | ICD-10-CM | POA: Diagnosis not present

## 2017-11-23 DIAGNOSIS — N186 End stage renal disease: Secondary | ICD-10-CM | POA: Diagnosis not present

## 2017-11-24 DIAGNOSIS — D631 Anemia in chronic kidney disease: Secondary | ICD-10-CM | POA: Diagnosis not present

## 2017-11-24 DIAGNOSIS — N186 End stage renal disease: Secondary | ICD-10-CM | POA: Diagnosis not present

## 2017-11-24 DIAGNOSIS — E440 Moderate protein-calorie malnutrition: Secondary | ICD-10-CM | POA: Diagnosis not present

## 2017-11-24 DIAGNOSIS — E7849 Other hyperlipidemia: Secondary | ICD-10-CM | POA: Diagnosis not present

## 2017-11-24 DIAGNOSIS — N2589 Other disorders resulting from impaired renal tubular function: Secondary | ICD-10-CM | POA: Diagnosis not present

## 2017-11-24 DIAGNOSIS — D509 Iron deficiency anemia, unspecified: Secondary | ICD-10-CM | POA: Diagnosis not present

## 2017-11-24 DIAGNOSIS — N2581 Secondary hyperparathyroidism of renal origin: Secondary | ICD-10-CM | POA: Diagnosis not present

## 2017-11-24 DIAGNOSIS — Z4932 Encounter for adequacy testing for peritoneal dialysis: Secondary | ICD-10-CM | POA: Diagnosis not present

## 2017-11-24 DIAGNOSIS — D513 Other dietary vitamin B12 deficiency anemia: Secondary | ICD-10-CM | POA: Diagnosis not present

## 2017-11-24 DIAGNOSIS — E119 Type 2 diabetes mellitus without complications: Secondary | ICD-10-CM | POA: Diagnosis not present

## 2017-11-25 DIAGNOSIS — N186 End stage renal disease: Secondary | ICD-10-CM | POA: Diagnosis not present

## 2017-11-25 DIAGNOSIS — N2589 Other disorders resulting from impaired renal tubular function: Secondary | ICD-10-CM | POA: Diagnosis not present

## 2017-11-25 DIAGNOSIS — Z4932 Encounter for adequacy testing for peritoneal dialysis: Secondary | ICD-10-CM | POA: Diagnosis not present

## 2017-11-25 DIAGNOSIS — E119 Type 2 diabetes mellitus without complications: Secondary | ICD-10-CM | POA: Diagnosis not present

## 2017-11-25 DIAGNOSIS — D631 Anemia in chronic kidney disease: Secondary | ICD-10-CM | POA: Diagnosis not present

## 2017-11-25 DIAGNOSIS — E7849 Other hyperlipidemia: Secondary | ICD-10-CM | POA: Diagnosis not present

## 2017-11-25 DIAGNOSIS — D513 Other dietary vitamin B12 deficiency anemia: Secondary | ICD-10-CM | POA: Diagnosis not present

## 2017-11-25 DIAGNOSIS — N2581 Secondary hyperparathyroidism of renal origin: Secondary | ICD-10-CM | POA: Diagnosis not present

## 2017-11-25 DIAGNOSIS — E440 Moderate protein-calorie malnutrition: Secondary | ICD-10-CM | POA: Diagnosis not present

## 2017-11-25 DIAGNOSIS — D509 Iron deficiency anemia, unspecified: Secondary | ICD-10-CM | POA: Diagnosis not present

## 2017-11-26 DIAGNOSIS — E440 Moderate protein-calorie malnutrition: Secondary | ICD-10-CM | POA: Diagnosis not present

## 2017-11-26 DIAGNOSIS — N2589 Other disorders resulting from impaired renal tubular function: Secondary | ICD-10-CM | POA: Diagnosis not present

## 2017-11-26 DIAGNOSIS — N2581 Secondary hyperparathyroidism of renal origin: Secondary | ICD-10-CM | POA: Diagnosis not present

## 2017-11-26 DIAGNOSIS — E119 Type 2 diabetes mellitus without complications: Secondary | ICD-10-CM | POA: Diagnosis not present

## 2017-11-26 DIAGNOSIS — E7849 Other hyperlipidemia: Secondary | ICD-10-CM | POA: Diagnosis not present

## 2017-11-26 DIAGNOSIS — D509 Iron deficiency anemia, unspecified: Secondary | ICD-10-CM | POA: Diagnosis not present

## 2017-11-26 DIAGNOSIS — D513 Other dietary vitamin B12 deficiency anemia: Secondary | ICD-10-CM | POA: Diagnosis not present

## 2017-11-26 DIAGNOSIS — N186 End stage renal disease: Secondary | ICD-10-CM | POA: Diagnosis not present

## 2017-11-26 DIAGNOSIS — D631 Anemia in chronic kidney disease: Secondary | ICD-10-CM | POA: Diagnosis not present

## 2017-11-26 DIAGNOSIS — Z4932 Encounter for adequacy testing for peritoneal dialysis: Secondary | ICD-10-CM | POA: Diagnosis not present

## 2017-11-27 DIAGNOSIS — Z4932 Encounter for adequacy testing for peritoneal dialysis: Secondary | ICD-10-CM | POA: Diagnosis not present

## 2017-11-27 DIAGNOSIS — N2589 Other disorders resulting from impaired renal tubular function: Secondary | ICD-10-CM | POA: Diagnosis not present

## 2017-11-27 DIAGNOSIS — E119 Type 2 diabetes mellitus without complications: Secondary | ICD-10-CM | POA: Diagnosis not present

## 2017-11-27 DIAGNOSIS — E440 Moderate protein-calorie malnutrition: Secondary | ICD-10-CM | POA: Diagnosis not present

## 2017-11-27 DIAGNOSIS — D513 Other dietary vitamin B12 deficiency anemia: Secondary | ICD-10-CM | POA: Diagnosis not present

## 2017-11-27 DIAGNOSIS — D631 Anemia in chronic kidney disease: Secondary | ICD-10-CM | POA: Diagnosis not present

## 2017-11-27 DIAGNOSIS — N186 End stage renal disease: Secondary | ICD-10-CM | POA: Diagnosis not present

## 2017-11-27 DIAGNOSIS — D509 Iron deficiency anemia, unspecified: Secondary | ICD-10-CM | POA: Diagnosis not present

## 2017-11-27 DIAGNOSIS — N2581 Secondary hyperparathyroidism of renal origin: Secondary | ICD-10-CM | POA: Diagnosis not present

## 2017-11-27 DIAGNOSIS — E7849 Other hyperlipidemia: Secondary | ICD-10-CM | POA: Diagnosis not present

## 2017-11-28 DIAGNOSIS — D631 Anemia in chronic kidney disease: Secondary | ICD-10-CM | POA: Diagnosis not present

## 2017-11-28 DIAGNOSIS — D513 Other dietary vitamin B12 deficiency anemia: Secondary | ICD-10-CM | POA: Diagnosis not present

## 2017-11-28 DIAGNOSIS — E440 Moderate protein-calorie malnutrition: Secondary | ICD-10-CM | POA: Diagnosis not present

## 2017-11-28 DIAGNOSIS — N186 End stage renal disease: Secondary | ICD-10-CM | POA: Diagnosis not present

## 2017-11-28 DIAGNOSIS — D509 Iron deficiency anemia, unspecified: Secondary | ICD-10-CM | POA: Diagnosis not present

## 2017-11-28 DIAGNOSIS — E119 Type 2 diabetes mellitus without complications: Secondary | ICD-10-CM | POA: Diagnosis not present

## 2017-11-28 DIAGNOSIS — E7849 Other hyperlipidemia: Secondary | ICD-10-CM | POA: Diagnosis not present

## 2017-11-28 DIAGNOSIS — Z4932 Encounter for adequacy testing for peritoneal dialysis: Secondary | ICD-10-CM | POA: Diagnosis not present

## 2017-11-28 DIAGNOSIS — N2581 Secondary hyperparathyroidism of renal origin: Secondary | ICD-10-CM | POA: Diagnosis not present

## 2017-11-28 DIAGNOSIS — N2589 Other disorders resulting from impaired renal tubular function: Secondary | ICD-10-CM | POA: Diagnosis not present

## 2017-11-29 DIAGNOSIS — E7849 Other hyperlipidemia: Secondary | ICD-10-CM | POA: Diagnosis not present

## 2017-11-29 DIAGNOSIS — N2589 Other disorders resulting from impaired renal tubular function: Secondary | ICD-10-CM | POA: Diagnosis not present

## 2017-11-29 DIAGNOSIS — E119 Type 2 diabetes mellitus without complications: Secondary | ICD-10-CM | POA: Diagnosis not present

## 2017-11-29 DIAGNOSIS — N186 End stage renal disease: Secondary | ICD-10-CM | POA: Diagnosis not present

## 2017-11-29 DIAGNOSIS — N2581 Secondary hyperparathyroidism of renal origin: Secondary | ICD-10-CM | POA: Diagnosis not present

## 2017-11-29 DIAGNOSIS — D631 Anemia in chronic kidney disease: Secondary | ICD-10-CM | POA: Diagnosis not present

## 2017-11-29 DIAGNOSIS — Z4932 Encounter for adequacy testing for peritoneal dialysis: Secondary | ICD-10-CM | POA: Diagnosis not present

## 2017-11-29 DIAGNOSIS — D513 Other dietary vitamin B12 deficiency anemia: Secondary | ICD-10-CM | POA: Diagnosis not present

## 2017-11-29 DIAGNOSIS — E440 Moderate protein-calorie malnutrition: Secondary | ICD-10-CM | POA: Diagnosis not present

## 2017-11-29 DIAGNOSIS — D509 Iron deficiency anemia, unspecified: Secondary | ICD-10-CM | POA: Diagnosis not present

## 2017-11-30 DIAGNOSIS — E119 Type 2 diabetes mellitus without complications: Secondary | ICD-10-CM | POA: Diagnosis not present

## 2017-11-30 DIAGNOSIS — N186 End stage renal disease: Secondary | ICD-10-CM | POA: Diagnosis not present

## 2017-11-30 DIAGNOSIS — N2589 Other disorders resulting from impaired renal tubular function: Secondary | ICD-10-CM | POA: Diagnosis not present

## 2017-11-30 DIAGNOSIS — D631 Anemia in chronic kidney disease: Secondary | ICD-10-CM | POA: Diagnosis not present

## 2017-11-30 DIAGNOSIS — D509 Iron deficiency anemia, unspecified: Secondary | ICD-10-CM | POA: Diagnosis not present

## 2017-11-30 DIAGNOSIS — Z4932 Encounter for adequacy testing for peritoneal dialysis: Secondary | ICD-10-CM | POA: Diagnosis not present

## 2017-11-30 DIAGNOSIS — N2581 Secondary hyperparathyroidism of renal origin: Secondary | ICD-10-CM | POA: Diagnosis not present

## 2017-11-30 DIAGNOSIS — E440 Moderate protein-calorie malnutrition: Secondary | ICD-10-CM | POA: Diagnosis not present

## 2017-11-30 DIAGNOSIS — E7849 Other hyperlipidemia: Secondary | ICD-10-CM | POA: Diagnosis not present

## 2017-11-30 DIAGNOSIS — D513 Other dietary vitamin B12 deficiency anemia: Secondary | ICD-10-CM | POA: Diagnosis not present

## 2017-12-01 DIAGNOSIS — D509 Iron deficiency anemia, unspecified: Secondary | ICD-10-CM | POA: Diagnosis not present

## 2017-12-01 DIAGNOSIS — N186 End stage renal disease: Secondary | ICD-10-CM | POA: Diagnosis not present

## 2017-12-01 DIAGNOSIS — E7849 Other hyperlipidemia: Secondary | ICD-10-CM | POA: Diagnosis not present

## 2017-12-01 DIAGNOSIS — N2589 Other disorders resulting from impaired renal tubular function: Secondary | ICD-10-CM | POA: Diagnosis not present

## 2017-12-01 DIAGNOSIS — D513 Other dietary vitamin B12 deficiency anemia: Secondary | ICD-10-CM | POA: Diagnosis not present

## 2017-12-01 DIAGNOSIS — N2581 Secondary hyperparathyroidism of renal origin: Secondary | ICD-10-CM | POA: Diagnosis not present

## 2017-12-01 DIAGNOSIS — E440 Moderate protein-calorie malnutrition: Secondary | ICD-10-CM | POA: Diagnosis not present

## 2017-12-01 DIAGNOSIS — E119 Type 2 diabetes mellitus without complications: Secondary | ICD-10-CM | POA: Diagnosis not present

## 2017-12-01 DIAGNOSIS — Z4932 Encounter for adequacy testing for peritoneal dialysis: Secondary | ICD-10-CM | POA: Diagnosis not present

## 2017-12-01 DIAGNOSIS — D631 Anemia in chronic kidney disease: Secondary | ICD-10-CM | POA: Diagnosis not present

## 2017-12-02 DIAGNOSIS — N186 End stage renal disease: Secondary | ICD-10-CM | POA: Diagnosis not present

## 2017-12-02 DIAGNOSIS — N2589 Other disorders resulting from impaired renal tubular function: Secondary | ICD-10-CM | POA: Diagnosis not present

## 2017-12-02 DIAGNOSIS — Z4932 Encounter for adequacy testing for peritoneal dialysis: Secondary | ICD-10-CM | POA: Diagnosis not present

## 2017-12-02 DIAGNOSIS — E440 Moderate protein-calorie malnutrition: Secondary | ICD-10-CM | POA: Diagnosis not present

## 2017-12-02 DIAGNOSIS — D513 Other dietary vitamin B12 deficiency anemia: Secondary | ICD-10-CM | POA: Diagnosis not present

## 2017-12-02 DIAGNOSIS — D509 Iron deficiency anemia, unspecified: Secondary | ICD-10-CM | POA: Diagnosis not present

## 2017-12-02 DIAGNOSIS — E119 Type 2 diabetes mellitus without complications: Secondary | ICD-10-CM | POA: Diagnosis not present

## 2017-12-02 DIAGNOSIS — E7849 Other hyperlipidemia: Secondary | ICD-10-CM | POA: Diagnosis not present

## 2017-12-02 DIAGNOSIS — D631 Anemia in chronic kidney disease: Secondary | ICD-10-CM | POA: Diagnosis not present

## 2017-12-02 DIAGNOSIS — N2581 Secondary hyperparathyroidism of renal origin: Secondary | ICD-10-CM | POA: Diagnosis not present

## 2017-12-03 DIAGNOSIS — N2581 Secondary hyperparathyroidism of renal origin: Secondary | ICD-10-CM | POA: Diagnosis not present

## 2017-12-03 DIAGNOSIS — D631 Anemia in chronic kidney disease: Secondary | ICD-10-CM | POA: Diagnosis not present

## 2017-12-03 DIAGNOSIS — Z4932 Encounter for adequacy testing for peritoneal dialysis: Secondary | ICD-10-CM | POA: Diagnosis not present

## 2017-12-03 DIAGNOSIS — N186 End stage renal disease: Secondary | ICD-10-CM | POA: Diagnosis not present

## 2017-12-03 DIAGNOSIS — E119 Type 2 diabetes mellitus without complications: Secondary | ICD-10-CM | POA: Diagnosis not present

## 2017-12-03 DIAGNOSIS — D509 Iron deficiency anemia, unspecified: Secondary | ICD-10-CM | POA: Diagnosis not present

## 2017-12-03 DIAGNOSIS — D513 Other dietary vitamin B12 deficiency anemia: Secondary | ICD-10-CM | POA: Diagnosis not present

## 2017-12-03 DIAGNOSIS — N2589 Other disorders resulting from impaired renal tubular function: Secondary | ICD-10-CM | POA: Diagnosis not present

## 2017-12-03 DIAGNOSIS — E440 Moderate protein-calorie malnutrition: Secondary | ICD-10-CM | POA: Diagnosis not present

## 2017-12-03 DIAGNOSIS — E7849 Other hyperlipidemia: Secondary | ICD-10-CM | POA: Diagnosis not present

## 2017-12-04 DIAGNOSIS — D631 Anemia in chronic kidney disease: Secondary | ICD-10-CM | POA: Diagnosis not present

## 2017-12-04 DIAGNOSIS — E119 Type 2 diabetes mellitus without complications: Secondary | ICD-10-CM | POA: Diagnosis not present

## 2017-12-04 DIAGNOSIS — Z4932 Encounter for adequacy testing for peritoneal dialysis: Secondary | ICD-10-CM | POA: Diagnosis not present

## 2017-12-04 DIAGNOSIS — E440 Moderate protein-calorie malnutrition: Secondary | ICD-10-CM | POA: Diagnosis not present

## 2017-12-04 DIAGNOSIS — N2589 Other disorders resulting from impaired renal tubular function: Secondary | ICD-10-CM | POA: Diagnosis not present

## 2017-12-04 DIAGNOSIS — N2581 Secondary hyperparathyroidism of renal origin: Secondary | ICD-10-CM | POA: Diagnosis not present

## 2017-12-04 DIAGNOSIS — D513 Other dietary vitamin B12 deficiency anemia: Secondary | ICD-10-CM | POA: Diagnosis not present

## 2017-12-04 DIAGNOSIS — D509 Iron deficiency anemia, unspecified: Secondary | ICD-10-CM | POA: Diagnosis not present

## 2017-12-04 DIAGNOSIS — E7849 Other hyperlipidemia: Secondary | ICD-10-CM | POA: Diagnosis not present

## 2017-12-04 DIAGNOSIS — N186 End stage renal disease: Secondary | ICD-10-CM | POA: Diagnosis not present

## 2017-12-05 DIAGNOSIS — E7849 Other hyperlipidemia: Secondary | ICD-10-CM | POA: Diagnosis not present

## 2017-12-05 DIAGNOSIS — N186 End stage renal disease: Secondary | ICD-10-CM | POA: Diagnosis not present

## 2017-12-05 DIAGNOSIS — D631 Anemia in chronic kidney disease: Secondary | ICD-10-CM | POA: Diagnosis not present

## 2017-12-05 DIAGNOSIS — D513 Other dietary vitamin B12 deficiency anemia: Secondary | ICD-10-CM | POA: Diagnosis not present

## 2017-12-05 DIAGNOSIS — Z4932 Encounter for adequacy testing for peritoneal dialysis: Secondary | ICD-10-CM | POA: Diagnosis not present

## 2017-12-05 DIAGNOSIS — N2581 Secondary hyperparathyroidism of renal origin: Secondary | ICD-10-CM | POA: Diagnosis not present

## 2017-12-05 DIAGNOSIS — E119 Type 2 diabetes mellitus without complications: Secondary | ICD-10-CM | POA: Diagnosis not present

## 2017-12-05 DIAGNOSIS — D509 Iron deficiency anemia, unspecified: Secondary | ICD-10-CM | POA: Diagnosis not present

## 2017-12-05 DIAGNOSIS — E440 Moderate protein-calorie malnutrition: Secondary | ICD-10-CM | POA: Diagnosis not present

## 2017-12-05 DIAGNOSIS — N2589 Other disorders resulting from impaired renal tubular function: Secondary | ICD-10-CM | POA: Diagnosis not present

## 2017-12-06 DIAGNOSIS — N2589 Other disorders resulting from impaired renal tubular function: Secondary | ICD-10-CM | POA: Diagnosis not present

## 2017-12-06 DIAGNOSIS — E440 Moderate protein-calorie malnutrition: Secondary | ICD-10-CM | POA: Diagnosis not present

## 2017-12-06 DIAGNOSIS — E7849 Other hyperlipidemia: Secondary | ICD-10-CM | POA: Diagnosis not present

## 2017-12-06 DIAGNOSIS — D631 Anemia in chronic kidney disease: Secondary | ICD-10-CM | POA: Diagnosis not present

## 2017-12-06 DIAGNOSIS — N2581 Secondary hyperparathyroidism of renal origin: Secondary | ICD-10-CM | POA: Diagnosis not present

## 2017-12-06 DIAGNOSIS — Z4932 Encounter for adequacy testing for peritoneal dialysis: Secondary | ICD-10-CM | POA: Diagnosis not present

## 2017-12-06 DIAGNOSIS — D509 Iron deficiency anemia, unspecified: Secondary | ICD-10-CM | POA: Diagnosis not present

## 2017-12-06 DIAGNOSIS — D513 Other dietary vitamin B12 deficiency anemia: Secondary | ICD-10-CM | POA: Diagnosis not present

## 2017-12-06 DIAGNOSIS — N186 End stage renal disease: Secondary | ICD-10-CM | POA: Diagnosis not present

## 2017-12-06 DIAGNOSIS — E119 Type 2 diabetes mellitus without complications: Secondary | ICD-10-CM | POA: Diagnosis not present

## 2017-12-07 DIAGNOSIS — E440 Moderate protein-calorie malnutrition: Secondary | ICD-10-CM | POA: Diagnosis not present

## 2017-12-07 DIAGNOSIS — D513 Other dietary vitamin B12 deficiency anemia: Secondary | ICD-10-CM | POA: Diagnosis not present

## 2017-12-07 DIAGNOSIS — D631 Anemia in chronic kidney disease: Secondary | ICD-10-CM | POA: Diagnosis not present

## 2017-12-07 DIAGNOSIS — N2589 Other disorders resulting from impaired renal tubular function: Secondary | ICD-10-CM | POA: Diagnosis not present

## 2017-12-07 DIAGNOSIS — N2581 Secondary hyperparathyroidism of renal origin: Secondary | ICD-10-CM | POA: Diagnosis not present

## 2017-12-07 DIAGNOSIS — D509 Iron deficiency anemia, unspecified: Secondary | ICD-10-CM | POA: Diagnosis not present

## 2017-12-07 DIAGNOSIS — N186 End stage renal disease: Secondary | ICD-10-CM | POA: Diagnosis not present

## 2017-12-07 DIAGNOSIS — E7849 Other hyperlipidemia: Secondary | ICD-10-CM | POA: Diagnosis not present

## 2017-12-07 DIAGNOSIS — E119 Type 2 diabetes mellitus without complications: Secondary | ICD-10-CM | POA: Diagnosis not present

## 2017-12-07 DIAGNOSIS — Z4932 Encounter for adequacy testing for peritoneal dialysis: Secondary | ICD-10-CM | POA: Diagnosis not present

## 2017-12-08 DIAGNOSIS — N2581 Secondary hyperparathyroidism of renal origin: Secondary | ICD-10-CM | POA: Diagnosis not present

## 2017-12-08 DIAGNOSIS — Z4932 Encounter for adequacy testing for peritoneal dialysis: Secondary | ICD-10-CM | POA: Diagnosis not present

## 2017-12-08 DIAGNOSIS — N2589 Other disorders resulting from impaired renal tubular function: Secondary | ICD-10-CM | POA: Diagnosis not present

## 2017-12-08 DIAGNOSIS — E7849 Other hyperlipidemia: Secondary | ICD-10-CM | POA: Diagnosis not present

## 2017-12-08 DIAGNOSIS — E440 Moderate protein-calorie malnutrition: Secondary | ICD-10-CM | POA: Diagnosis not present

## 2017-12-08 DIAGNOSIS — N186 End stage renal disease: Secondary | ICD-10-CM | POA: Diagnosis not present

## 2017-12-08 DIAGNOSIS — E119 Type 2 diabetes mellitus without complications: Secondary | ICD-10-CM | POA: Diagnosis not present

## 2017-12-08 DIAGNOSIS — D509 Iron deficiency anemia, unspecified: Secondary | ICD-10-CM | POA: Diagnosis not present

## 2017-12-08 DIAGNOSIS — D631 Anemia in chronic kidney disease: Secondary | ICD-10-CM | POA: Diagnosis not present

## 2017-12-08 DIAGNOSIS — D513 Other dietary vitamin B12 deficiency anemia: Secondary | ICD-10-CM | POA: Diagnosis not present

## 2017-12-09 DIAGNOSIS — D513 Other dietary vitamin B12 deficiency anemia: Secondary | ICD-10-CM | POA: Diagnosis not present

## 2017-12-09 DIAGNOSIS — N2589 Other disorders resulting from impaired renal tubular function: Secondary | ICD-10-CM | POA: Diagnosis not present

## 2017-12-09 DIAGNOSIS — E440 Moderate protein-calorie malnutrition: Secondary | ICD-10-CM | POA: Diagnosis not present

## 2017-12-09 DIAGNOSIS — Z4932 Encounter for adequacy testing for peritoneal dialysis: Secondary | ICD-10-CM | POA: Diagnosis not present

## 2017-12-09 DIAGNOSIS — D509 Iron deficiency anemia, unspecified: Secondary | ICD-10-CM | POA: Diagnosis not present

## 2017-12-09 DIAGNOSIS — N186 End stage renal disease: Secondary | ICD-10-CM | POA: Diagnosis not present

## 2017-12-09 DIAGNOSIS — E7849 Other hyperlipidemia: Secondary | ICD-10-CM | POA: Diagnosis not present

## 2017-12-09 DIAGNOSIS — N2581 Secondary hyperparathyroidism of renal origin: Secondary | ICD-10-CM | POA: Diagnosis not present

## 2017-12-09 DIAGNOSIS — E119 Type 2 diabetes mellitus without complications: Secondary | ICD-10-CM | POA: Diagnosis not present

## 2017-12-09 DIAGNOSIS — D631 Anemia in chronic kidney disease: Secondary | ICD-10-CM | POA: Diagnosis not present

## 2017-12-10 DIAGNOSIS — E7849 Other hyperlipidemia: Secondary | ICD-10-CM | POA: Diagnosis not present

## 2017-12-10 DIAGNOSIS — E440 Moderate protein-calorie malnutrition: Secondary | ICD-10-CM | POA: Diagnosis not present

## 2017-12-10 DIAGNOSIS — D509 Iron deficiency anemia, unspecified: Secondary | ICD-10-CM | POA: Diagnosis not present

## 2017-12-10 DIAGNOSIS — N2581 Secondary hyperparathyroidism of renal origin: Secondary | ICD-10-CM | POA: Diagnosis not present

## 2017-12-10 DIAGNOSIS — Z4932 Encounter for adequacy testing for peritoneal dialysis: Secondary | ICD-10-CM | POA: Diagnosis not present

## 2017-12-10 DIAGNOSIS — D513 Other dietary vitamin B12 deficiency anemia: Secondary | ICD-10-CM | POA: Diagnosis not present

## 2017-12-10 DIAGNOSIS — E119 Type 2 diabetes mellitus without complications: Secondary | ICD-10-CM | POA: Diagnosis not present

## 2017-12-10 DIAGNOSIS — N186 End stage renal disease: Secondary | ICD-10-CM | POA: Diagnosis not present

## 2017-12-10 DIAGNOSIS — N2589 Other disorders resulting from impaired renal tubular function: Secondary | ICD-10-CM | POA: Diagnosis not present

## 2017-12-10 DIAGNOSIS — D631 Anemia in chronic kidney disease: Secondary | ICD-10-CM | POA: Diagnosis not present

## 2017-12-11 DIAGNOSIS — Z4932 Encounter for adequacy testing for peritoneal dialysis: Secondary | ICD-10-CM | POA: Diagnosis not present

## 2017-12-11 DIAGNOSIS — N186 End stage renal disease: Secondary | ICD-10-CM | POA: Diagnosis not present

## 2017-12-11 DIAGNOSIS — E7849 Other hyperlipidemia: Secondary | ICD-10-CM | POA: Diagnosis not present

## 2017-12-11 DIAGNOSIS — N2581 Secondary hyperparathyroidism of renal origin: Secondary | ICD-10-CM | POA: Diagnosis not present

## 2017-12-11 DIAGNOSIS — N2589 Other disorders resulting from impaired renal tubular function: Secondary | ICD-10-CM | POA: Diagnosis not present

## 2017-12-11 DIAGNOSIS — D509 Iron deficiency anemia, unspecified: Secondary | ICD-10-CM | POA: Diagnosis not present

## 2017-12-11 DIAGNOSIS — E119 Type 2 diabetes mellitus without complications: Secondary | ICD-10-CM | POA: Diagnosis not present

## 2017-12-11 DIAGNOSIS — E440 Moderate protein-calorie malnutrition: Secondary | ICD-10-CM | POA: Diagnosis not present

## 2017-12-11 DIAGNOSIS — D631 Anemia in chronic kidney disease: Secondary | ICD-10-CM | POA: Diagnosis not present

## 2017-12-11 DIAGNOSIS — D513 Other dietary vitamin B12 deficiency anemia: Secondary | ICD-10-CM | POA: Diagnosis not present

## 2017-12-12 DIAGNOSIS — D513 Other dietary vitamin B12 deficiency anemia: Secondary | ICD-10-CM | POA: Diagnosis not present

## 2017-12-12 DIAGNOSIS — N2581 Secondary hyperparathyroidism of renal origin: Secondary | ICD-10-CM | POA: Diagnosis not present

## 2017-12-12 DIAGNOSIS — E119 Type 2 diabetes mellitus without complications: Secondary | ICD-10-CM | POA: Diagnosis not present

## 2017-12-12 DIAGNOSIS — E7849 Other hyperlipidemia: Secondary | ICD-10-CM | POA: Diagnosis not present

## 2017-12-12 DIAGNOSIS — D509 Iron deficiency anemia, unspecified: Secondary | ICD-10-CM | POA: Diagnosis not present

## 2017-12-12 DIAGNOSIS — E440 Moderate protein-calorie malnutrition: Secondary | ICD-10-CM | POA: Diagnosis not present

## 2017-12-12 DIAGNOSIS — D631 Anemia in chronic kidney disease: Secondary | ICD-10-CM | POA: Diagnosis not present

## 2017-12-12 DIAGNOSIS — N2589 Other disorders resulting from impaired renal tubular function: Secondary | ICD-10-CM | POA: Diagnosis not present

## 2017-12-12 DIAGNOSIS — Z4932 Encounter for adequacy testing for peritoneal dialysis: Secondary | ICD-10-CM | POA: Diagnosis not present

## 2017-12-12 DIAGNOSIS — N186 End stage renal disease: Secondary | ICD-10-CM | POA: Diagnosis not present

## 2017-12-13 DIAGNOSIS — N186 End stage renal disease: Secondary | ICD-10-CM | POA: Diagnosis not present

## 2017-12-13 DIAGNOSIS — D631 Anemia in chronic kidney disease: Secondary | ICD-10-CM | POA: Diagnosis not present

## 2017-12-13 DIAGNOSIS — E7849 Other hyperlipidemia: Secondary | ICD-10-CM | POA: Diagnosis not present

## 2017-12-13 DIAGNOSIS — D513 Other dietary vitamin B12 deficiency anemia: Secondary | ICD-10-CM | POA: Diagnosis not present

## 2017-12-13 DIAGNOSIS — N2581 Secondary hyperparathyroidism of renal origin: Secondary | ICD-10-CM | POA: Diagnosis not present

## 2017-12-13 DIAGNOSIS — Z4932 Encounter for adequacy testing for peritoneal dialysis: Secondary | ICD-10-CM | POA: Diagnosis not present

## 2017-12-13 DIAGNOSIS — N2589 Other disorders resulting from impaired renal tubular function: Secondary | ICD-10-CM | POA: Diagnosis not present

## 2017-12-13 DIAGNOSIS — E440 Moderate protein-calorie malnutrition: Secondary | ICD-10-CM | POA: Diagnosis not present

## 2017-12-13 DIAGNOSIS — D509 Iron deficiency anemia, unspecified: Secondary | ICD-10-CM | POA: Diagnosis not present

## 2017-12-13 DIAGNOSIS — E119 Type 2 diabetes mellitus without complications: Secondary | ICD-10-CM | POA: Diagnosis not present

## 2017-12-14 DIAGNOSIS — D631 Anemia in chronic kidney disease: Secondary | ICD-10-CM | POA: Diagnosis not present

## 2017-12-14 DIAGNOSIS — D513 Other dietary vitamin B12 deficiency anemia: Secondary | ICD-10-CM | POA: Diagnosis not present

## 2017-12-14 DIAGNOSIS — E440 Moderate protein-calorie malnutrition: Secondary | ICD-10-CM | POA: Diagnosis not present

## 2017-12-14 DIAGNOSIS — E119 Type 2 diabetes mellitus without complications: Secondary | ICD-10-CM | POA: Diagnosis not present

## 2017-12-14 DIAGNOSIS — E7849 Other hyperlipidemia: Secondary | ICD-10-CM | POA: Diagnosis not present

## 2017-12-14 DIAGNOSIS — Z4932 Encounter for adequacy testing for peritoneal dialysis: Secondary | ICD-10-CM | POA: Diagnosis not present

## 2017-12-14 DIAGNOSIS — N186 End stage renal disease: Secondary | ICD-10-CM | POA: Diagnosis not present

## 2017-12-14 DIAGNOSIS — N2589 Other disorders resulting from impaired renal tubular function: Secondary | ICD-10-CM | POA: Diagnosis not present

## 2017-12-14 DIAGNOSIS — N2581 Secondary hyperparathyroidism of renal origin: Secondary | ICD-10-CM | POA: Diagnosis not present

## 2017-12-14 DIAGNOSIS — D509 Iron deficiency anemia, unspecified: Secondary | ICD-10-CM | POA: Diagnosis not present

## 2017-12-15 DIAGNOSIS — N2589 Other disorders resulting from impaired renal tubular function: Secondary | ICD-10-CM | POA: Diagnosis not present

## 2017-12-15 DIAGNOSIS — N2581 Secondary hyperparathyroidism of renal origin: Secondary | ICD-10-CM | POA: Diagnosis not present

## 2017-12-15 DIAGNOSIS — E119 Type 2 diabetes mellitus without complications: Secondary | ICD-10-CM | POA: Diagnosis not present

## 2017-12-15 DIAGNOSIS — N186 End stage renal disease: Secondary | ICD-10-CM | POA: Diagnosis not present

## 2017-12-15 DIAGNOSIS — E7849 Other hyperlipidemia: Secondary | ICD-10-CM | POA: Diagnosis not present

## 2017-12-15 DIAGNOSIS — Z4932 Encounter for adequacy testing for peritoneal dialysis: Secondary | ICD-10-CM | POA: Diagnosis not present

## 2017-12-15 DIAGNOSIS — E440 Moderate protein-calorie malnutrition: Secondary | ICD-10-CM | POA: Diagnosis not present

## 2017-12-15 DIAGNOSIS — D509 Iron deficiency anemia, unspecified: Secondary | ICD-10-CM | POA: Diagnosis not present

## 2017-12-15 DIAGNOSIS — D631 Anemia in chronic kidney disease: Secondary | ICD-10-CM | POA: Diagnosis not present

## 2017-12-15 DIAGNOSIS — D513 Other dietary vitamin B12 deficiency anemia: Secondary | ICD-10-CM | POA: Diagnosis not present

## 2017-12-16 DIAGNOSIS — E7849 Other hyperlipidemia: Secondary | ICD-10-CM | POA: Diagnosis not present

## 2017-12-16 DIAGNOSIS — Z4932 Encounter for adequacy testing for peritoneal dialysis: Secondary | ICD-10-CM | POA: Diagnosis not present

## 2017-12-16 DIAGNOSIS — D509 Iron deficiency anemia, unspecified: Secondary | ICD-10-CM | POA: Diagnosis not present

## 2017-12-16 DIAGNOSIS — N2581 Secondary hyperparathyroidism of renal origin: Secondary | ICD-10-CM | POA: Diagnosis not present

## 2017-12-16 DIAGNOSIS — E440 Moderate protein-calorie malnutrition: Secondary | ICD-10-CM | POA: Diagnosis not present

## 2017-12-16 DIAGNOSIS — D513 Other dietary vitamin B12 deficiency anemia: Secondary | ICD-10-CM | POA: Diagnosis not present

## 2017-12-16 DIAGNOSIS — D631 Anemia in chronic kidney disease: Secondary | ICD-10-CM | POA: Diagnosis not present

## 2017-12-16 DIAGNOSIS — N186 End stage renal disease: Secondary | ICD-10-CM | POA: Diagnosis not present

## 2017-12-16 DIAGNOSIS — E119 Type 2 diabetes mellitus without complications: Secondary | ICD-10-CM | POA: Diagnosis not present

## 2017-12-16 DIAGNOSIS — N2589 Other disorders resulting from impaired renal tubular function: Secondary | ICD-10-CM | POA: Diagnosis not present

## 2017-12-17 DIAGNOSIS — D631 Anemia in chronic kidney disease: Secondary | ICD-10-CM | POA: Diagnosis not present

## 2017-12-17 DIAGNOSIS — D513 Other dietary vitamin B12 deficiency anemia: Secondary | ICD-10-CM | POA: Diagnosis not present

## 2017-12-17 DIAGNOSIS — N2589 Other disorders resulting from impaired renal tubular function: Secondary | ICD-10-CM | POA: Diagnosis not present

## 2017-12-17 DIAGNOSIS — N2581 Secondary hyperparathyroidism of renal origin: Secondary | ICD-10-CM | POA: Diagnosis not present

## 2017-12-17 DIAGNOSIS — Z4932 Encounter for adequacy testing for peritoneal dialysis: Secondary | ICD-10-CM | POA: Diagnosis not present

## 2017-12-17 DIAGNOSIS — E440 Moderate protein-calorie malnutrition: Secondary | ICD-10-CM | POA: Diagnosis not present

## 2017-12-17 DIAGNOSIS — E7849 Other hyperlipidemia: Secondary | ICD-10-CM | POA: Diagnosis not present

## 2017-12-17 DIAGNOSIS — N186 End stage renal disease: Secondary | ICD-10-CM | POA: Diagnosis not present

## 2017-12-17 DIAGNOSIS — D509 Iron deficiency anemia, unspecified: Secondary | ICD-10-CM | POA: Diagnosis not present

## 2017-12-17 DIAGNOSIS — E119 Type 2 diabetes mellitus without complications: Secondary | ICD-10-CM | POA: Diagnosis not present

## 2017-12-18 DIAGNOSIS — N2589 Other disorders resulting from impaired renal tubular function: Secondary | ICD-10-CM | POA: Diagnosis not present

## 2017-12-18 DIAGNOSIS — D509 Iron deficiency anemia, unspecified: Secondary | ICD-10-CM | POA: Diagnosis not present

## 2017-12-18 DIAGNOSIS — N186 End stage renal disease: Secondary | ICD-10-CM | POA: Diagnosis not present

## 2017-12-18 DIAGNOSIS — D631 Anemia in chronic kidney disease: Secondary | ICD-10-CM | POA: Diagnosis not present

## 2017-12-18 DIAGNOSIS — E7849 Other hyperlipidemia: Secondary | ICD-10-CM | POA: Diagnosis not present

## 2017-12-18 DIAGNOSIS — E119 Type 2 diabetes mellitus without complications: Secondary | ICD-10-CM | POA: Diagnosis not present

## 2017-12-18 DIAGNOSIS — D513 Other dietary vitamin B12 deficiency anemia: Secondary | ICD-10-CM | POA: Diagnosis not present

## 2017-12-18 DIAGNOSIS — E440 Moderate protein-calorie malnutrition: Secondary | ICD-10-CM | POA: Diagnosis not present

## 2017-12-18 DIAGNOSIS — Z4932 Encounter for adequacy testing for peritoneal dialysis: Secondary | ICD-10-CM | POA: Diagnosis not present

## 2017-12-18 DIAGNOSIS — N2581 Secondary hyperparathyroidism of renal origin: Secondary | ICD-10-CM | POA: Diagnosis not present

## 2017-12-19 DIAGNOSIS — E7849 Other hyperlipidemia: Secondary | ICD-10-CM | POA: Diagnosis not present

## 2017-12-19 DIAGNOSIS — D631 Anemia in chronic kidney disease: Secondary | ICD-10-CM | POA: Diagnosis not present

## 2017-12-19 DIAGNOSIS — E119 Type 2 diabetes mellitus without complications: Secondary | ICD-10-CM | POA: Diagnosis not present

## 2017-12-19 DIAGNOSIS — N2589 Other disorders resulting from impaired renal tubular function: Secondary | ICD-10-CM | POA: Diagnosis not present

## 2017-12-19 DIAGNOSIS — D509 Iron deficiency anemia, unspecified: Secondary | ICD-10-CM | POA: Diagnosis not present

## 2017-12-19 DIAGNOSIS — N2581 Secondary hyperparathyroidism of renal origin: Secondary | ICD-10-CM | POA: Diagnosis not present

## 2017-12-19 DIAGNOSIS — E440 Moderate protein-calorie malnutrition: Secondary | ICD-10-CM | POA: Diagnosis not present

## 2017-12-19 DIAGNOSIS — Z4932 Encounter for adequacy testing for peritoneal dialysis: Secondary | ICD-10-CM | POA: Diagnosis not present

## 2017-12-19 DIAGNOSIS — N186 End stage renal disease: Secondary | ICD-10-CM | POA: Diagnosis not present

## 2017-12-19 DIAGNOSIS — D513 Other dietary vitamin B12 deficiency anemia: Secondary | ICD-10-CM | POA: Diagnosis not present

## 2017-12-20 DIAGNOSIS — D513 Other dietary vitamin B12 deficiency anemia: Secondary | ICD-10-CM | POA: Diagnosis not present

## 2017-12-20 DIAGNOSIS — D509 Iron deficiency anemia, unspecified: Secondary | ICD-10-CM | POA: Diagnosis not present

## 2017-12-20 DIAGNOSIS — E1122 Type 2 diabetes mellitus with diabetic chronic kidney disease: Secondary | ICD-10-CM | POA: Diagnosis not present

## 2017-12-20 DIAGNOSIS — E440 Moderate protein-calorie malnutrition: Secondary | ICD-10-CM | POA: Diagnosis not present

## 2017-12-20 DIAGNOSIS — N186 End stage renal disease: Secondary | ICD-10-CM | POA: Diagnosis not present

## 2017-12-20 DIAGNOSIS — D631 Anemia in chronic kidney disease: Secondary | ICD-10-CM | POA: Diagnosis not present

## 2017-12-20 DIAGNOSIS — N2581 Secondary hyperparathyroidism of renal origin: Secondary | ICD-10-CM | POA: Diagnosis not present

## 2017-12-20 DIAGNOSIS — E119 Type 2 diabetes mellitus without complications: Secondary | ICD-10-CM | POA: Diagnosis not present

## 2017-12-20 DIAGNOSIS — Z992 Dependence on renal dialysis: Secondary | ICD-10-CM | POA: Diagnosis not present

## 2017-12-20 DIAGNOSIS — N2589 Other disorders resulting from impaired renal tubular function: Secondary | ICD-10-CM | POA: Diagnosis not present

## 2017-12-20 DIAGNOSIS — E7849 Other hyperlipidemia: Secondary | ICD-10-CM | POA: Diagnosis not present

## 2017-12-20 DIAGNOSIS — Z4932 Encounter for adequacy testing for peritoneal dialysis: Secondary | ICD-10-CM | POA: Diagnosis not present

## 2017-12-21 DIAGNOSIS — E440 Moderate protein-calorie malnutrition: Secondary | ICD-10-CM | POA: Diagnosis not present

## 2017-12-21 DIAGNOSIS — Z79899 Other long term (current) drug therapy: Secondary | ICD-10-CM | POA: Diagnosis not present

## 2017-12-21 DIAGNOSIS — Z992 Dependence on renal dialysis: Secondary | ICD-10-CM | POA: Diagnosis not present

## 2017-12-21 DIAGNOSIS — D509 Iron deficiency anemia, unspecified: Secondary | ICD-10-CM | POA: Diagnosis not present

## 2017-12-21 DIAGNOSIS — N2581 Secondary hyperparathyroidism of renal origin: Secondary | ICD-10-CM | POA: Diagnosis not present

## 2017-12-21 DIAGNOSIS — N186 End stage renal disease: Secondary | ICD-10-CM | POA: Diagnosis not present

## 2017-12-21 DIAGNOSIS — R82998 Other abnormal findings in urine: Secondary | ICD-10-CM | POA: Diagnosis not present

## 2017-12-21 DIAGNOSIS — R17 Unspecified jaundice: Secondary | ICD-10-CM | POA: Diagnosis not present

## 2017-12-21 DIAGNOSIS — E1122 Type 2 diabetes mellitus with diabetic chronic kidney disease: Secondary | ICD-10-CM | POA: Diagnosis not present

## 2017-12-21 DIAGNOSIS — D631 Anemia in chronic kidney disease: Secondary | ICD-10-CM | POA: Diagnosis not present

## 2017-12-22 DIAGNOSIS — R17 Unspecified jaundice: Secondary | ICD-10-CM | POA: Diagnosis not present

## 2017-12-22 DIAGNOSIS — R82998 Other abnormal findings in urine: Secondary | ICD-10-CM | POA: Diagnosis not present

## 2017-12-22 DIAGNOSIS — N2581 Secondary hyperparathyroidism of renal origin: Secondary | ICD-10-CM | POA: Diagnosis not present

## 2017-12-22 DIAGNOSIS — D509 Iron deficiency anemia, unspecified: Secondary | ICD-10-CM | POA: Diagnosis not present

## 2017-12-22 DIAGNOSIS — N186 End stage renal disease: Secondary | ICD-10-CM | POA: Diagnosis not present

## 2017-12-22 DIAGNOSIS — D631 Anemia in chronic kidney disease: Secondary | ICD-10-CM | POA: Diagnosis not present

## 2017-12-22 DIAGNOSIS — Z79899 Other long term (current) drug therapy: Secondary | ICD-10-CM | POA: Diagnosis not present

## 2017-12-22 DIAGNOSIS — E440 Moderate protein-calorie malnutrition: Secondary | ICD-10-CM | POA: Diagnosis not present

## 2017-12-23 DIAGNOSIS — Z79899 Other long term (current) drug therapy: Secondary | ICD-10-CM | POA: Diagnosis not present

## 2017-12-23 DIAGNOSIS — D631 Anemia in chronic kidney disease: Secondary | ICD-10-CM | POA: Diagnosis not present

## 2017-12-23 DIAGNOSIS — N2581 Secondary hyperparathyroidism of renal origin: Secondary | ICD-10-CM | POA: Diagnosis not present

## 2017-12-23 DIAGNOSIS — E440 Moderate protein-calorie malnutrition: Secondary | ICD-10-CM | POA: Diagnosis not present

## 2017-12-23 DIAGNOSIS — R82998 Other abnormal findings in urine: Secondary | ICD-10-CM | POA: Diagnosis not present

## 2017-12-23 DIAGNOSIS — N186 End stage renal disease: Secondary | ICD-10-CM | POA: Diagnosis not present

## 2017-12-23 DIAGNOSIS — R17 Unspecified jaundice: Secondary | ICD-10-CM | POA: Diagnosis not present

## 2017-12-23 DIAGNOSIS — D509 Iron deficiency anemia, unspecified: Secondary | ICD-10-CM | POA: Diagnosis not present

## 2017-12-24 DIAGNOSIS — D509 Iron deficiency anemia, unspecified: Secondary | ICD-10-CM | POA: Diagnosis not present

## 2017-12-24 DIAGNOSIS — D631 Anemia in chronic kidney disease: Secondary | ICD-10-CM | POA: Diagnosis not present

## 2017-12-24 DIAGNOSIS — N186 End stage renal disease: Secondary | ICD-10-CM | POA: Diagnosis not present

## 2017-12-24 DIAGNOSIS — N2581 Secondary hyperparathyroidism of renal origin: Secondary | ICD-10-CM | POA: Diagnosis not present

## 2017-12-24 DIAGNOSIS — R82998 Other abnormal findings in urine: Secondary | ICD-10-CM | POA: Diagnosis not present

## 2017-12-24 DIAGNOSIS — E440 Moderate protein-calorie malnutrition: Secondary | ICD-10-CM | POA: Diagnosis not present

## 2017-12-24 DIAGNOSIS — R17 Unspecified jaundice: Secondary | ICD-10-CM | POA: Diagnosis not present

## 2017-12-24 DIAGNOSIS — Z79899 Other long term (current) drug therapy: Secondary | ICD-10-CM | POA: Diagnosis not present

## 2017-12-25 DIAGNOSIS — R17 Unspecified jaundice: Secondary | ICD-10-CM | POA: Diagnosis not present

## 2017-12-25 DIAGNOSIS — D509 Iron deficiency anemia, unspecified: Secondary | ICD-10-CM | POA: Diagnosis not present

## 2017-12-25 DIAGNOSIS — D631 Anemia in chronic kidney disease: Secondary | ICD-10-CM | POA: Diagnosis not present

## 2017-12-25 DIAGNOSIS — N2581 Secondary hyperparathyroidism of renal origin: Secondary | ICD-10-CM | POA: Diagnosis not present

## 2017-12-25 DIAGNOSIS — N186 End stage renal disease: Secondary | ICD-10-CM | POA: Diagnosis not present

## 2017-12-25 DIAGNOSIS — R82998 Other abnormal findings in urine: Secondary | ICD-10-CM | POA: Diagnosis not present

## 2017-12-25 DIAGNOSIS — Z79899 Other long term (current) drug therapy: Secondary | ICD-10-CM | POA: Diagnosis not present

## 2017-12-25 DIAGNOSIS — E440 Moderate protein-calorie malnutrition: Secondary | ICD-10-CM | POA: Diagnosis not present

## 2017-12-26 DIAGNOSIS — Z79899 Other long term (current) drug therapy: Secondary | ICD-10-CM | POA: Diagnosis not present

## 2017-12-26 DIAGNOSIS — D631 Anemia in chronic kidney disease: Secondary | ICD-10-CM | POA: Diagnosis not present

## 2017-12-26 DIAGNOSIS — N186 End stage renal disease: Secondary | ICD-10-CM | POA: Diagnosis not present

## 2017-12-26 DIAGNOSIS — R17 Unspecified jaundice: Secondary | ICD-10-CM | POA: Diagnosis not present

## 2017-12-26 DIAGNOSIS — D509 Iron deficiency anemia, unspecified: Secondary | ICD-10-CM | POA: Diagnosis not present

## 2017-12-26 DIAGNOSIS — N2581 Secondary hyperparathyroidism of renal origin: Secondary | ICD-10-CM | POA: Diagnosis not present

## 2017-12-26 DIAGNOSIS — E440 Moderate protein-calorie malnutrition: Secondary | ICD-10-CM | POA: Diagnosis not present

## 2017-12-26 DIAGNOSIS — R82998 Other abnormal findings in urine: Secondary | ICD-10-CM | POA: Diagnosis not present

## 2017-12-27 DIAGNOSIS — D631 Anemia in chronic kidney disease: Secondary | ICD-10-CM | POA: Diagnosis not present

## 2017-12-27 DIAGNOSIS — N186 End stage renal disease: Secondary | ICD-10-CM | POA: Diagnosis not present

## 2017-12-27 DIAGNOSIS — R82998 Other abnormal findings in urine: Secondary | ICD-10-CM | POA: Diagnosis not present

## 2017-12-27 DIAGNOSIS — N2581 Secondary hyperparathyroidism of renal origin: Secondary | ICD-10-CM | POA: Diagnosis not present

## 2017-12-27 DIAGNOSIS — R17 Unspecified jaundice: Secondary | ICD-10-CM | POA: Diagnosis not present

## 2017-12-27 DIAGNOSIS — D509 Iron deficiency anemia, unspecified: Secondary | ICD-10-CM | POA: Diagnosis not present

## 2017-12-27 DIAGNOSIS — Z79899 Other long term (current) drug therapy: Secondary | ICD-10-CM | POA: Diagnosis not present

## 2017-12-27 DIAGNOSIS — E440 Moderate protein-calorie malnutrition: Secondary | ICD-10-CM | POA: Diagnosis not present

## 2017-12-28 DIAGNOSIS — E440 Moderate protein-calorie malnutrition: Secondary | ICD-10-CM | POA: Diagnosis not present

## 2017-12-28 DIAGNOSIS — N2581 Secondary hyperparathyroidism of renal origin: Secondary | ICD-10-CM | POA: Diagnosis not present

## 2017-12-28 DIAGNOSIS — R82998 Other abnormal findings in urine: Secondary | ICD-10-CM | POA: Diagnosis not present

## 2017-12-28 DIAGNOSIS — D509 Iron deficiency anemia, unspecified: Secondary | ICD-10-CM | POA: Diagnosis not present

## 2017-12-28 DIAGNOSIS — Z79899 Other long term (current) drug therapy: Secondary | ICD-10-CM | POA: Diagnosis not present

## 2017-12-28 DIAGNOSIS — D631 Anemia in chronic kidney disease: Secondary | ICD-10-CM | POA: Diagnosis not present

## 2017-12-28 DIAGNOSIS — R17 Unspecified jaundice: Secondary | ICD-10-CM | POA: Diagnosis not present

## 2017-12-28 DIAGNOSIS — N186 End stage renal disease: Secondary | ICD-10-CM | POA: Diagnosis not present

## 2017-12-29 DIAGNOSIS — D509 Iron deficiency anemia, unspecified: Secondary | ICD-10-CM | POA: Diagnosis not present

## 2017-12-29 DIAGNOSIS — Z79899 Other long term (current) drug therapy: Secondary | ICD-10-CM | POA: Diagnosis not present

## 2017-12-29 DIAGNOSIS — E440 Moderate protein-calorie malnutrition: Secondary | ICD-10-CM | POA: Diagnosis not present

## 2017-12-29 DIAGNOSIS — N186 End stage renal disease: Secondary | ICD-10-CM | POA: Diagnosis not present

## 2017-12-29 DIAGNOSIS — R17 Unspecified jaundice: Secondary | ICD-10-CM | POA: Diagnosis not present

## 2017-12-29 DIAGNOSIS — R82998 Other abnormal findings in urine: Secondary | ICD-10-CM | POA: Diagnosis not present

## 2017-12-29 DIAGNOSIS — N2581 Secondary hyperparathyroidism of renal origin: Secondary | ICD-10-CM | POA: Diagnosis not present

## 2017-12-29 DIAGNOSIS — D631 Anemia in chronic kidney disease: Secondary | ICD-10-CM | POA: Diagnosis not present

## 2017-12-30 DIAGNOSIS — E440 Moderate protein-calorie malnutrition: Secondary | ICD-10-CM | POA: Diagnosis not present

## 2017-12-30 DIAGNOSIS — R82998 Other abnormal findings in urine: Secondary | ICD-10-CM | POA: Diagnosis not present

## 2017-12-30 DIAGNOSIS — R17 Unspecified jaundice: Secondary | ICD-10-CM | POA: Diagnosis not present

## 2017-12-30 DIAGNOSIS — N186 End stage renal disease: Secondary | ICD-10-CM | POA: Diagnosis not present

## 2017-12-30 DIAGNOSIS — N2581 Secondary hyperparathyroidism of renal origin: Secondary | ICD-10-CM | POA: Diagnosis not present

## 2017-12-30 DIAGNOSIS — D509 Iron deficiency anemia, unspecified: Secondary | ICD-10-CM | POA: Diagnosis not present

## 2017-12-30 DIAGNOSIS — Z79899 Other long term (current) drug therapy: Secondary | ICD-10-CM | POA: Diagnosis not present

## 2017-12-30 DIAGNOSIS — D631 Anemia in chronic kidney disease: Secondary | ICD-10-CM | POA: Diagnosis not present

## 2017-12-31 DIAGNOSIS — N2581 Secondary hyperparathyroidism of renal origin: Secondary | ICD-10-CM | POA: Diagnosis not present

## 2017-12-31 DIAGNOSIS — R82998 Other abnormal findings in urine: Secondary | ICD-10-CM | POA: Diagnosis not present

## 2017-12-31 DIAGNOSIS — N186 End stage renal disease: Secondary | ICD-10-CM | POA: Diagnosis not present

## 2017-12-31 DIAGNOSIS — E440 Moderate protein-calorie malnutrition: Secondary | ICD-10-CM | POA: Diagnosis not present

## 2017-12-31 DIAGNOSIS — D509 Iron deficiency anemia, unspecified: Secondary | ICD-10-CM | POA: Diagnosis not present

## 2017-12-31 DIAGNOSIS — D631 Anemia in chronic kidney disease: Secondary | ICD-10-CM | POA: Diagnosis not present

## 2017-12-31 DIAGNOSIS — Z79899 Other long term (current) drug therapy: Secondary | ICD-10-CM | POA: Diagnosis not present

## 2017-12-31 DIAGNOSIS — R17 Unspecified jaundice: Secondary | ICD-10-CM | POA: Diagnosis not present

## 2018-01-01 DIAGNOSIS — N2581 Secondary hyperparathyroidism of renal origin: Secondary | ICD-10-CM | POA: Diagnosis not present

## 2018-01-01 DIAGNOSIS — R82998 Other abnormal findings in urine: Secondary | ICD-10-CM | POA: Diagnosis not present

## 2018-01-01 DIAGNOSIS — D509 Iron deficiency anemia, unspecified: Secondary | ICD-10-CM | POA: Diagnosis not present

## 2018-01-01 DIAGNOSIS — N186 End stage renal disease: Secondary | ICD-10-CM | POA: Diagnosis not present

## 2018-01-01 DIAGNOSIS — E440 Moderate protein-calorie malnutrition: Secondary | ICD-10-CM | POA: Diagnosis not present

## 2018-01-01 DIAGNOSIS — R17 Unspecified jaundice: Secondary | ICD-10-CM | POA: Diagnosis not present

## 2018-01-01 DIAGNOSIS — Z79899 Other long term (current) drug therapy: Secondary | ICD-10-CM | POA: Diagnosis not present

## 2018-01-01 DIAGNOSIS — D631 Anemia in chronic kidney disease: Secondary | ICD-10-CM | POA: Diagnosis not present

## 2018-01-02 DIAGNOSIS — E440 Moderate protein-calorie malnutrition: Secondary | ICD-10-CM | POA: Diagnosis not present

## 2018-01-02 DIAGNOSIS — N2581 Secondary hyperparathyroidism of renal origin: Secondary | ICD-10-CM | POA: Diagnosis not present

## 2018-01-02 DIAGNOSIS — Z79899 Other long term (current) drug therapy: Secondary | ICD-10-CM | POA: Diagnosis not present

## 2018-01-02 DIAGNOSIS — R82998 Other abnormal findings in urine: Secondary | ICD-10-CM | POA: Diagnosis not present

## 2018-01-02 DIAGNOSIS — N186 End stage renal disease: Secondary | ICD-10-CM | POA: Diagnosis not present

## 2018-01-02 DIAGNOSIS — D509 Iron deficiency anemia, unspecified: Secondary | ICD-10-CM | POA: Diagnosis not present

## 2018-01-02 DIAGNOSIS — R17 Unspecified jaundice: Secondary | ICD-10-CM | POA: Diagnosis not present

## 2018-01-02 DIAGNOSIS — D631 Anemia in chronic kidney disease: Secondary | ICD-10-CM | POA: Diagnosis not present

## 2018-01-03 DIAGNOSIS — D631 Anemia in chronic kidney disease: Secondary | ICD-10-CM | POA: Diagnosis not present

## 2018-01-03 DIAGNOSIS — E440 Moderate protein-calorie malnutrition: Secondary | ICD-10-CM | POA: Diagnosis not present

## 2018-01-03 DIAGNOSIS — Z79899 Other long term (current) drug therapy: Secondary | ICD-10-CM | POA: Diagnosis not present

## 2018-01-03 DIAGNOSIS — R82998 Other abnormal findings in urine: Secondary | ICD-10-CM | POA: Diagnosis not present

## 2018-01-03 DIAGNOSIS — N186 End stage renal disease: Secondary | ICD-10-CM | POA: Diagnosis not present

## 2018-01-03 DIAGNOSIS — D509 Iron deficiency anemia, unspecified: Secondary | ICD-10-CM | POA: Diagnosis not present

## 2018-01-03 DIAGNOSIS — R17 Unspecified jaundice: Secondary | ICD-10-CM | POA: Diagnosis not present

## 2018-01-03 DIAGNOSIS — N2581 Secondary hyperparathyroidism of renal origin: Secondary | ICD-10-CM | POA: Diagnosis not present

## 2018-01-04 DIAGNOSIS — E440 Moderate protein-calorie malnutrition: Secondary | ICD-10-CM | POA: Diagnosis not present

## 2018-01-04 DIAGNOSIS — D509 Iron deficiency anemia, unspecified: Secondary | ICD-10-CM | POA: Diagnosis not present

## 2018-01-04 DIAGNOSIS — N2581 Secondary hyperparathyroidism of renal origin: Secondary | ICD-10-CM | POA: Diagnosis not present

## 2018-01-04 DIAGNOSIS — Z79899 Other long term (current) drug therapy: Secondary | ICD-10-CM | POA: Diagnosis not present

## 2018-01-04 DIAGNOSIS — N186 End stage renal disease: Secondary | ICD-10-CM | POA: Diagnosis not present

## 2018-01-04 DIAGNOSIS — R17 Unspecified jaundice: Secondary | ICD-10-CM | POA: Diagnosis not present

## 2018-01-04 DIAGNOSIS — R82998 Other abnormal findings in urine: Secondary | ICD-10-CM | POA: Diagnosis not present

## 2018-01-04 DIAGNOSIS — D631 Anemia in chronic kidney disease: Secondary | ICD-10-CM | POA: Diagnosis not present

## 2018-01-05 DIAGNOSIS — R17 Unspecified jaundice: Secondary | ICD-10-CM | POA: Diagnosis not present

## 2018-01-05 DIAGNOSIS — E440 Moderate protein-calorie malnutrition: Secondary | ICD-10-CM | POA: Diagnosis not present

## 2018-01-05 DIAGNOSIS — D509 Iron deficiency anemia, unspecified: Secondary | ICD-10-CM | POA: Diagnosis not present

## 2018-01-05 DIAGNOSIS — D631 Anemia in chronic kidney disease: Secondary | ICD-10-CM | POA: Diagnosis not present

## 2018-01-05 DIAGNOSIS — Z79899 Other long term (current) drug therapy: Secondary | ICD-10-CM | POA: Diagnosis not present

## 2018-01-05 DIAGNOSIS — N2581 Secondary hyperparathyroidism of renal origin: Secondary | ICD-10-CM | POA: Diagnosis not present

## 2018-01-05 DIAGNOSIS — N186 End stage renal disease: Secondary | ICD-10-CM | POA: Diagnosis not present

## 2018-01-05 DIAGNOSIS — R82998 Other abnormal findings in urine: Secondary | ICD-10-CM | POA: Diagnosis not present

## 2018-01-06 DIAGNOSIS — D631 Anemia in chronic kidney disease: Secondary | ICD-10-CM | POA: Diagnosis not present

## 2018-01-06 DIAGNOSIS — Z79899 Other long term (current) drug therapy: Secondary | ICD-10-CM | POA: Diagnosis not present

## 2018-01-06 DIAGNOSIS — E440 Moderate protein-calorie malnutrition: Secondary | ICD-10-CM | POA: Diagnosis not present

## 2018-01-06 DIAGNOSIS — R82998 Other abnormal findings in urine: Secondary | ICD-10-CM | POA: Diagnosis not present

## 2018-01-06 DIAGNOSIS — R17 Unspecified jaundice: Secondary | ICD-10-CM | POA: Diagnosis not present

## 2018-01-06 DIAGNOSIS — D509 Iron deficiency anemia, unspecified: Secondary | ICD-10-CM | POA: Diagnosis not present

## 2018-01-06 DIAGNOSIS — N186 End stage renal disease: Secondary | ICD-10-CM | POA: Diagnosis not present

## 2018-01-06 DIAGNOSIS — N2581 Secondary hyperparathyroidism of renal origin: Secondary | ICD-10-CM | POA: Diagnosis not present

## 2018-01-07 DIAGNOSIS — R17 Unspecified jaundice: Secondary | ICD-10-CM | POA: Diagnosis not present

## 2018-01-07 DIAGNOSIS — Z79899 Other long term (current) drug therapy: Secondary | ICD-10-CM | POA: Diagnosis not present

## 2018-01-07 DIAGNOSIS — D631 Anemia in chronic kidney disease: Secondary | ICD-10-CM | POA: Diagnosis not present

## 2018-01-07 DIAGNOSIS — E440 Moderate protein-calorie malnutrition: Secondary | ICD-10-CM | POA: Diagnosis not present

## 2018-01-07 DIAGNOSIS — R82998 Other abnormal findings in urine: Secondary | ICD-10-CM | POA: Diagnosis not present

## 2018-01-07 DIAGNOSIS — N2581 Secondary hyperparathyroidism of renal origin: Secondary | ICD-10-CM | POA: Diagnosis not present

## 2018-01-07 DIAGNOSIS — N186 End stage renal disease: Secondary | ICD-10-CM | POA: Diagnosis not present

## 2018-01-07 DIAGNOSIS — D509 Iron deficiency anemia, unspecified: Secondary | ICD-10-CM | POA: Diagnosis not present

## 2018-01-08 DIAGNOSIS — N2581 Secondary hyperparathyroidism of renal origin: Secondary | ICD-10-CM | POA: Diagnosis not present

## 2018-01-08 DIAGNOSIS — D509 Iron deficiency anemia, unspecified: Secondary | ICD-10-CM | POA: Diagnosis not present

## 2018-01-08 DIAGNOSIS — R17 Unspecified jaundice: Secondary | ICD-10-CM | POA: Diagnosis not present

## 2018-01-08 DIAGNOSIS — N186 End stage renal disease: Secondary | ICD-10-CM | POA: Diagnosis not present

## 2018-01-08 DIAGNOSIS — D631 Anemia in chronic kidney disease: Secondary | ICD-10-CM | POA: Diagnosis not present

## 2018-01-08 DIAGNOSIS — E440 Moderate protein-calorie malnutrition: Secondary | ICD-10-CM | POA: Diagnosis not present

## 2018-01-08 DIAGNOSIS — R82998 Other abnormal findings in urine: Secondary | ICD-10-CM | POA: Diagnosis not present

## 2018-01-08 DIAGNOSIS — Z79899 Other long term (current) drug therapy: Secondary | ICD-10-CM | POA: Diagnosis not present

## 2018-01-09 DIAGNOSIS — R82998 Other abnormal findings in urine: Secondary | ICD-10-CM | POA: Diagnosis not present

## 2018-01-09 DIAGNOSIS — D509 Iron deficiency anemia, unspecified: Secondary | ICD-10-CM | POA: Diagnosis not present

## 2018-01-09 DIAGNOSIS — N186 End stage renal disease: Secondary | ICD-10-CM | POA: Diagnosis not present

## 2018-01-09 DIAGNOSIS — D631 Anemia in chronic kidney disease: Secondary | ICD-10-CM | POA: Diagnosis not present

## 2018-01-09 DIAGNOSIS — N2581 Secondary hyperparathyroidism of renal origin: Secondary | ICD-10-CM | POA: Diagnosis not present

## 2018-01-09 DIAGNOSIS — R17 Unspecified jaundice: Secondary | ICD-10-CM | POA: Diagnosis not present

## 2018-01-09 DIAGNOSIS — E440 Moderate protein-calorie malnutrition: Secondary | ICD-10-CM | POA: Diagnosis not present

## 2018-01-09 DIAGNOSIS — Z79899 Other long term (current) drug therapy: Secondary | ICD-10-CM | POA: Diagnosis not present

## 2018-01-10 DIAGNOSIS — N186 End stage renal disease: Secondary | ICD-10-CM | POA: Diagnosis not present

## 2018-01-10 DIAGNOSIS — Z79899 Other long term (current) drug therapy: Secondary | ICD-10-CM | POA: Diagnosis not present

## 2018-01-10 DIAGNOSIS — D509 Iron deficiency anemia, unspecified: Secondary | ICD-10-CM | POA: Diagnosis not present

## 2018-01-10 DIAGNOSIS — R17 Unspecified jaundice: Secondary | ICD-10-CM | POA: Diagnosis not present

## 2018-01-10 DIAGNOSIS — D631 Anemia in chronic kidney disease: Secondary | ICD-10-CM | POA: Diagnosis not present

## 2018-01-10 DIAGNOSIS — R82998 Other abnormal findings in urine: Secondary | ICD-10-CM | POA: Diagnosis not present

## 2018-01-10 DIAGNOSIS — E440 Moderate protein-calorie malnutrition: Secondary | ICD-10-CM | POA: Diagnosis not present

## 2018-01-10 DIAGNOSIS — N2581 Secondary hyperparathyroidism of renal origin: Secondary | ICD-10-CM | POA: Diagnosis not present

## 2018-01-11 DIAGNOSIS — R17 Unspecified jaundice: Secondary | ICD-10-CM | POA: Diagnosis not present

## 2018-01-11 DIAGNOSIS — N2581 Secondary hyperparathyroidism of renal origin: Secondary | ICD-10-CM | POA: Diagnosis not present

## 2018-01-11 DIAGNOSIS — Z79899 Other long term (current) drug therapy: Secondary | ICD-10-CM | POA: Diagnosis not present

## 2018-01-11 DIAGNOSIS — R82998 Other abnormal findings in urine: Secondary | ICD-10-CM | POA: Diagnosis not present

## 2018-01-11 DIAGNOSIS — N186 End stage renal disease: Secondary | ICD-10-CM | POA: Diagnosis not present

## 2018-01-11 DIAGNOSIS — D631 Anemia in chronic kidney disease: Secondary | ICD-10-CM | POA: Diagnosis not present

## 2018-01-11 DIAGNOSIS — E440 Moderate protein-calorie malnutrition: Secondary | ICD-10-CM | POA: Diagnosis not present

## 2018-01-11 DIAGNOSIS — D509 Iron deficiency anemia, unspecified: Secondary | ICD-10-CM | POA: Diagnosis not present

## 2018-01-12 DIAGNOSIS — E440 Moderate protein-calorie malnutrition: Secondary | ICD-10-CM | POA: Diagnosis not present

## 2018-01-12 DIAGNOSIS — D509 Iron deficiency anemia, unspecified: Secondary | ICD-10-CM | POA: Diagnosis not present

## 2018-01-12 DIAGNOSIS — N186 End stage renal disease: Secondary | ICD-10-CM | POA: Diagnosis not present

## 2018-01-12 DIAGNOSIS — Z79899 Other long term (current) drug therapy: Secondary | ICD-10-CM | POA: Diagnosis not present

## 2018-01-12 DIAGNOSIS — R82998 Other abnormal findings in urine: Secondary | ICD-10-CM | POA: Diagnosis not present

## 2018-01-12 DIAGNOSIS — N2581 Secondary hyperparathyroidism of renal origin: Secondary | ICD-10-CM | POA: Diagnosis not present

## 2018-01-12 DIAGNOSIS — D631 Anemia in chronic kidney disease: Secondary | ICD-10-CM | POA: Diagnosis not present

## 2018-01-12 DIAGNOSIS — R17 Unspecified jaundice: Secondary | ICD-10-CM | POA: Diagnosis not present

## 2018-01-13 DIAGNOSIS — N2581 Secondary hyperparathyroidism of renal origin: Secondary | ICD-10-CM | POA: Diagnosis not present

## 2018-01-13 DIAGNOSIS — N186 End stage renal disease: Secondary | ICD-10-CM | POA: Diagnosis not present

## 2018-01-13 DIAGNOSIS — Z79899 Other long term (current) drug therapy: Secondary | ICD-10-CM | POA: Diagnosis not present

## 2018-01-13 DIAGNOSIS — R82998 Other abnormal findings in urine: Secondary | ICD-10-CM | POA: Diagnosis not present

## 2018-01-13 DIAGNOSIS — D509 Iron deficiency anemia, unspecified: Secondary | ICD-10-CM | POA: Diagnosis not present

## 2018-01-13 DIAGNOSIS — D631 Anemia in chronic kidney disease: Secondary | ICD-10-CM | POA: Diagnosis not present

## 2018-01-13 DIAGNOSIS — E440 Moderate protein-calorie malnutrition: Secondary | ICD-10-CM | POA: Diagnosis not present

## 2018-01-13 DIAGNOSIS — R17 Unspecified jaundice: Secondary | ICD-10-CM | POA: Diagnosis not present

## 2018-01-14 DIAGNOSIS — D631 Anemia in chronic kidney disease: Secondary | ICD-10-CM | POA: Diagnosis not present

## 2018-01-14 DIAGNOSIS — E440 Moderate protein-calorie malnutrition: Secondary | ICD-10-CM | POA: Diagnosis not present

## 2018-01-14 DIAGNOSIS — R82998 Other abnormal findings in urine: Secondary | ICD-10-CM | POA: Diagnosis not present

## 2018-01-14 DIAGNOSIS — N2581 Secondary hyperparathyroidism of renal origin: Secondary | ICD-10-CM | POA: Diagnosis not present

## 2018-01-14 DIAGNOSIS — Z79899 Other long term (current) drug therapy: Secondary | ICD-10-CM | POA: Diagnosis not present

## 2018-01-14 DIAGNOSIS — R17 Unspecified jaundice: Secondary | ICD-10-CM | POA: Diagnosis not present

## 2018-01-14 DIAGNOSIS — D509 Iron deficiency anemia, unspecified: Secondary | ICD-10-CM | POA: Diagnosis not present

## 2018-01-14 DIAGNOSIS — N186 End stage renal disease: Secondary | ICD-10-CM | POA: Diagnosis not present

## 2018-01-15 ENCOUNTER — Encounter: Payer: Self-pay | Admitting: Internal Medicine

## 2018-01-15 DIAGNOSIS — R82998 Other abnormal findings in urine: Secondary | ICD-10-CM | POA: Diagnosis not present

## 2018-01-15 DIAGNOSIS — R17 Unspecified jaundice: Secondary | ICD-10-CM | POA: Diagnosis not present

## 2018-01-15 DIAGNOSIS — Z79899 Other long term (current) drug therapy: Secondary | ICD-10-CM | POA: Diagnosis not present

## 2018-01-15 DIAGNOSIS — N2581 Secondary hyperparathyroidism of renal origin: Secondary | ICD-10-CM | POA: Diagnosis not present

## 2018-01-15 DIAGNOSIS — E440 Moderate protein-calorie malnutrition: Secondary | ICD-10-CM | POA: Diagnosis not present

## 2018-01-15 DIAGNOSIS — D509 Iron deficiency anemia, unspecified: Secondary | ICD-10-CM | POA: Diagnosis not present

## 2018-01-15 DIAGNOSIS — N186 End stage renal disease: Secondary | ICD-10-CM | POA: Diagnosis not present

## 2018-01-15 DIAGNOSIS — D631 Anemia in chronic kidney disease: Secondary | ICD-10-CM | POA: Diagnosis not present

## 2018-01-16 DIAGNOSIS — R17 Unspecified jaundice: Secondary | ICD-10-CM | POA: Diagnosis not present

## 2018-01-16 DIAGNOSIS — D631 Anemia in chronic kidney disease: Secondary | ICD-10-CM | POA: Diagnosis not present

## 2018-01-16 DIAGNOSIS — N2581 Secondary hyperparathyroidism of renal origin: Secondary | ICD-10-CM | POA: Diagnosis not present

## 2018-01-16 DIAGNOSIS — R82998 Other abnormal findings in urine: Secondary | ICD-10-CM | POA: Diagnosis not present

## 2018-01-16 DIAGNOSIS — D509 Iron deficiency anemia, unspecified: Secondary | ICD-10-CM | POA: Diagnosis not present

## 2018-01-16 DIAGNOSIS — E440 Moderate protein-calorie malnutrition: Secondary | ICD-10-CM | POA: Diagnosis not present

## 2018-01-16 DIAGNOSIS — Z79899 Other long term (current) drug therapy: Secondary | ICD-10-CM | POA: Diagnosis not present

## 2018-01-16 DIAGNOSIS — N186 End stage renal disease: Secondary | ICD-10-CM | POA: Diagnosis not present

## 2018-01-17 DIAGNOSIS — R82998 Other abnormal findings in urine: Secondary | ICD-10-CM | POA: Diagnosis not present

## 2018-01-17 DIAGNOSIS — D631 Anemia in chronic kidney disease: Secondary | ICD-10-CM | POA: Diagnosis not present

## 2018-01-17 DIAGNOSIS — D509 Iron deficiency anemia, unspecified: Secondary | ICD-10-CM | POA: Diagnosis not present

## 2018-01-17 DIAGNOSIS — E440 Moderate protein-calorie malnutrition: Secondary | ICD-10-CM | POA: Diagnosis not present

## 2018-01-17 DIAGNOSIS — Z79899 Other long term (current) drug therapy: Secondary | ICD-10-CM | POA: Diagnosis not present

## 2018-01-17 DIAGNOSIS — N2581 Secondary hyperparathyroidism of renal origin: Secondary | ICD-10-CM | POA: Diagnosis not present

## 2018-01-17 DIAGNOSIS — R17 Unspecified jaundice: Secondary | ICD-10-CM | POA: Diagnosis not present

## 2018-01-17 DIAGNOSIS — N186 End stage renal disease: Secondary | ICD-10-CM | POA: Diagnosis not present

## 2018-01-18 DIAGNOSIS — N186 End stage renal disease: Secondary | ICD-10-CM | POA: Diagnosis not present

## 2018-01-18 DIAGNOSIS — Z992 Dependence on renal dialysis: Secondary | ICD-10-CM | POA: Diagnosis not present

## 2018-01-18 DIAGNOSIS — R82998 Other abnormal findings in urine: Secondary | ICD-10-CM | POA: Diagnosis not present

## 2018-01-18 DIAGNOSIS — D631 Anemia in chronic kidney disease: Secondary | ICD-10-CM | POA: Diagnosis not present

## 2018-01-18 DIAGNOSIS — R17 Unspecified jaundice: Secondary | ICD-10-CM | POA: Diagnosis not present

## 2018-01-18 DIAGNOSIS — D509 Iron deficiency anemia, unspecified: Secondary | ICD-10-CM | POA: Diagnosis not present

## 2018-01-18 DIAGNOSIS — E440 Moderate protein-calorie malnutrition: Secondary | ICD-10-CM | POA: Diagnosis not present

## 2018-01-18 DIAGNOSIS — N2581 Secondary hyperparathyroidism of renal origin: Secondary | ICD-10-CM | POA: Diagnosis not present

## 2018-01-18 DIAGNOSIS — E1122 Type 2 diabetes mellitus with diabetic chronic kidney disease: Secondary | ICD-10-CM | POA: Diagnosis not present

## 2018-01-18 DIAGNOSIS — Z79899 Other long term (current) drug therapy: Secondary | ICD-10-CM | POA: Diagnosis not present

## 2018-01-19 DIAGNOSIS — E440 Moderate protein-calorie malnutrition: Secondary | ICD-10-CM | POA: Diagnosis not present

## 2018-01-19 DIAGNOSIS — N186 End stage renal disease: Secondary | ICD-10-CM | POA: Diagnosis not present

## 2018-01-19 DIAGNOSIS — Z79899 Other long term (current) drug therapy: Secondary | ICD-10-CM | POA: Diagnosis not present

## 2018-01-19 DIAGNOSIS — D509 Iron deficiency anemia, unspecified: Secondary | ICD-10-CM | POA: Diagnosis not present

## 2018-01-19 DIAGNOSIS — R82998 Other abnormal findings in urine: Secondary | ICD-10-CM | POA: Diagnosis not present

## 2018-01-19 DIAGNOSIS — N2581 Secondary hyperparathyroidism of renal origin: Secondary | ICD-10-CM | POA: Diagnosis not present

## 2018-01-19 DIAGNOSIS — D631 Anemia in chronic kidney disease: Secondary | ICD-10-CM | POA: Diagnosis not present

## 2018-01-19 DIAGNOSIS — R17 Unspecified jaundice: Secondary | ICD-10-CM | POA: Diagnosis not present

## 2018-01-20 DIAGNOSIS — R82998 Other abnormal findings in urine: Secondary | ICD-10-CM | POA: Diagnosis not present

## 2018-01-20 DIAGNOSIS — D509 Iron deficiency anemia, unspecified: Secondary | ICD-10-CM | POA: Diagnosis not present

## 2018-01-20 DIAGNOSIS — N186 End stage renal disease: Secondary | ICD-10-CM | POA: Diagnosis not present

## 2018-01-20 DIAGNOSIS — R17 Unspecified jaundice: Secondary | ICD-10-CM | POA: Diagnosis not present

## 2018-01-20 DIAGNOSIS — E440 Moderate protein-calorie malnutrition: Secondary | ICD-10-CM | POA: Diagnosis not present

## 2018-01-20 DIAGNOSIS — D631 Anemia in chronic kidney disease: Secondary | ICD-10-CM | POA: Diagnosis not present

## 2018-01-20 DIAGNOSIS — Z79899 Other long term (current) drug therapy: Secondary | ICD-10-CM | POA: Diagnosis not present

## 2018-01-20 DIAGNOSIS — N2581 Secondary hyperparathyroidism of renal origin: Secondary | ICD-10-CM | POA: Diagnosis not present

## 2018-01-21 DIAGNOSIS — N2581 Secondary hyperparathyroidism of renal origin: Secondary | ICD-10-CM | POA: Diagnosis not present

## 2018-01-21 DIAGNOSIS — D509 Iron deficiency anemia, unspecified: Secondary | ICD-10-CM | POA: Diagnosis not present

## 2018-01-21 DIAGNOSIS — R82998 Other abnormal findings in urine: Secondary | ICD-10-CM | POA: Diagnosis not present

## 2018-01-21 DIAGNOSIS — R17 Unspecified jaundice: Secondary | ICD-10-CM | POA: Diagnosis not present

## 2018-01-21 DIAGNOSIS — D631 Anemia in chronic kidney disease: Secondary | ICD-10-CM | POA: Diagnosis not present

## 2018-01-21 DIAGNOSIS — Z79899 Other long term (current) drug therapy: Secondary | ICD-10-CM | POA: Diagnosis not present

## 2018-01-21 DIAGNOSIS — N186 End stage renal disease: Secondary | ICD-10-CM | POA: Diagnosis not present

## 2018-01-21 DIAGNOSIS — E440 Moderate protein-calorie malnutrition: Secondary | ICD-10-CM | POA: Diagnosis not present

## 2018-01-22 DIAGNOSIS — N186 End stage renal disease: Secondary | ICD-10-CM | POA: Diagnosis not present

## 2018-01-22 DIAGNOSIS — Z79899 Other long term (current) drug therapy: Secondary | ICD-10-CM | POA: Diagnosis not present

## 2018-01-22 DIAGNOSIS — R82998 Other abnormal findings in urine: Secondary | ICD-10-CM | POA: Diagnosis not present

## 2018-01-22 DIAGNOSIS — R17 Unspecified jaundice: Secondary | ICD-10-CM | POA: Diagnosis not present

## 2018-01-22 DIAGNOSIS — D509 Iron deficiency anemia, unspecified: Secondary | ICD-10-CM | POA: Diagnosis not present

## 2018-01-22 DIAGNOSIS — D631 Anemia in chronic kidney disease: Secondary | ICD-10-CM | POA: Diagnosis not present

## 2018-01-22 DIAGNOSIS — E440 Moderate protein-calorie malnutrition: Secondary | ICD-10-CM | POA: Diagnosis not present

## 2018-01-22 DIAGNOSIS — N2581 Secondary hyperparathyroidism of renal origin: Secondary | ICD-10-CM | POA: Diagnosis not present

## 2018-01-23 DIAGNOSIS — R82998 Other abnormal findings in urine: Secondary | ICD-10-CM | POA: Diagnosis not present

## 2018-01-23 DIAGNOSIS — Z79899 Other long term (current) drug therapy: Secondary | ICD-10-CM | POA: Diagnosis not present

## 2018-01-23 DIAGNOSIS — E440 Moderate protein-calorie malnutrition: Secondary | ICD-10-CM | POA: Diagnosis not present

## 2018-01-23 DIAGNOSIS — N186 End stage renal disease: Secondary | ICD-10-CM | POA: Diagnosis not present

## 2018-01-23 DIAGNOSIS — N2581 Secondary hyperparathyroidism of renal origin: Secondary | ICD-10-CM | POA: Diagnosis not present

## 2018-01-23 DIAGNOSIS — R17 Unspecified jaundice: Secondary | ICD-10-CM | POA: Diagnosis not present

## 2018-01-23 DIAGNOSIS — D631 Anemia in chronic kidney disease: Secondary | ICD-10-CM | POA: Diagnosis not present

## 2018-01-23 DIAGNOSIS — D509 Iron deficiency anemia, unspecified: Secondary | ICD-10-CM | POA: Diagnosis not present

## 2018-01-24 DIAGNOSIS — D631 Anemia in chronic kidney disease: Secondary | ICD-10-CM | POA: Diagnosis not present

## 2018-01-24 DIAGNOSIS — R82998 Other abnormal findings in urine: Secondary | ICD-10-CM | POA: Diagnosis not present

## 2018-01-24 DIAGNOSIS — N2581 Secondary hyperparathyroidism of renal origin: Secondary | ICD-10-CM | POA: Diagnosis not present

## 2018-01-24 DIAGNOSIS — D509 Iron deficiency anemia, unspecified: Secondary | ICD-10-CM | POA: Diagnosis not present

## 2018-01-24 DIAGNOSIS — Z79899 Other long term (current) drug therapy: Secondary | ICD-10-CM | POA: Diagnosis not present

## 2018-01-24 DIAGNOSIS — E440 Moderate protein-calorie malnutrition: Secondary | ICD-10-CM | POA: Diagnosis not present

## 2018-01-24 DIAGNOSIS — R17 Unspecified jaundice: Secondary | ICD-10-CM | POA: Diagnosis not present

## 2018-01-24 DIAGNOSIS — N186 End stage renal disease: Secondary | ICD-10-CM | POA: Diagnosis not present

## 2018-01-25 DIAGNOSIS — N186 End stage renal disease: Secondary | ICD-10-CM | POA: Diagnosis not present

## 2018-01-25 DIAGNOSIS — N2581 Secondary hyperparathyroidism of renal origin: Secondary | ICD-10-CM | POA: Diagnosis not present

## 2018-01-25 DIAGNOSIS — R17 Unspecified jaundice: Secondary | ICD-10-CM | POA: Diagnosis not present

## 2018-01-25 DIAGNOSIS — Z79899 Other long term (current) drug therapy: Secondary | ICD-10-CM | POA: Diagnosis not present

## 2018-01-25 DIAGNOSIS — D631 Anemia in chronic kidney disease: Secondary | ICD-10-CM | POA: Diagnosis not present

## 2018-01-25 DIAGNOSIS — R82998 Other abnormal findings in urine: Secondary | ICD-10-CM | POA: Diagnosis not present

## 2018-01-25 DIAGNOSIS — D509 Iron deficiency anemia, unspecified: Secondary | ICD-10-CM | POA: Diagnosis not present

## 2018-01-25 DIAGNOSIS — E440 Moderate protein-calorie malnutrition: Secondary | ICD-10-CM | POA: Diagnosis not present

## 2018-01-26 DIAGNOSIS — N186 End stage renal disease: Secondary | ICD-10-CM | POA: Diagnosis not present

## 2018-01-26 DIAGNOSIS — R82998 Other abnormal findings in urine: Secondary | ICD-10-CM | POA: Diagnosis not present

## 2018-01-26 DIAGNOSIS — D509 Iron deficiency anemia, unspecified: Secondary | ICD-10-CM | POA: Diagnosis not present

## 2018-01-26 DIAGNOSIS — Z79899 Other long term (current) drug therapy: Secondary | ICD-10-CM | POA: Diagnosis not present

## 2018-01-26 DIAGNOSIS — E440 Moderate protein-calorie malnutrition: Secondary | ICD-10-CM | POA: Diagnosis not present

## 2018-01-26 DIAGNOSIS — N2581 Secondary hyperparathyroidism of renal origin: Secondary | ICD-10-CM | POA: Diagnosis not present

## 2018-01-26 DIAGNOSIS — R17 Unspecified jaundice: Secondary | ICD-10-CM | POA: Diagnosis not present

## 2018-01-26 DIAGNOSIS — D631 Anemia in chronic kidney disease: Secondary | ICD-10-CM | POA: Diagnosis not present

## 2018-01-27 DIAGNOSIS — R17 Unspecified jaundice: Secondary | ICD-10-CM | POA: Diagnosis not present

## 2018-01-27 DIAGNOSIS — N186 End stage renal disease: Secondary | ICD-10-CM | POA: Diagnosis not present

## 2018-01-27 DIAGNOSIS — R82998 Other abnormal findings in urine: Secondary | ICD-10-CM | POA: Diagnosis not present

## 2018-01-27 DIAGNOSIS — D509 Iron deficiency anemia, unspecified: Secondary | ICD-10-CM | POA: Diagnosis not present

## 2018-01-27 DIAGNOSIS — E440 Moderate protein-calorie malnutrition: Secondary | ICD-10-CM | POA: Diagnosis not present

## 2018-01-27 DIAGNOSIS — Z79899 Other long term (current) drug therapy: Secondary | ICD-10-CM | POA: Diagnosis not present

## 2018-01-27 DIAGNOSIS — N2581 Secondary hyperparathyroidism of renal origin: Secondary | ICD-10-CM | POA: Diagnosis not present

## 2018-01-27 DIAGNOSIS — D631 Anemia in chronic kidney disease: Secondary | ICD-10-CM | POA: Diagnosis not present

## 2018-01-28 DIAGNOSIS — D631 Anemia in chronic kidney disease: Secondary | ICD-10-CM | POA: Diagnosis not present

## 2018-01-28 DIAGNOSIS — R82998 Other abnormal findings in urine: Secondary | ICD-10-CM | POA: Diagnosis not present

## 2018-01-28 DIAGNOSIS — N2581 Secondary hyperparathyroidism of renal origin: Secondary | ICD-10-CM | POA: Diagnosis not present

## 2018-01-28 DIAGNOSIS — N186 End stage renal disease: Secondary | ICD-10-CM | POA: Diagnosis not present

## 2018-01-28 DIAGNOSIS — D509 Iron deficiency anemia, unspecified: Secondary | ICD-10-CM | POA: Diagnosis not present

## 2018-01-28 DIAGNOSIS — R17 Unspecified jaundice: Secondary | ICD-10-CM | POA: Diagnosis not present

## 2018-01-28 DIAGNOSIS — E440 Moderate protein-calorie malnutrition: Secondary | ICD-10-CM | POA: Diagnosis not present

## 2018-01-28 DIAGNOSIS — Z79899 Other long term (current) drug therapy: Secondary | ICD-10-CM | POA: Diagnosis not present

## 2018-01-29 DIAGNOSIS — N2581 Secondary hyperparathyroidism of renal origin: Secondary | ICD-10-CM | POA: Diagnosis not present

## 2018-01-29 DIAGNOSIS — R17 Unspecified jaundice: Secondary | ICD-10-CM | POA: Diagnosis not present

## 2018-01-29 DIAGNOSIS — Z79899 Other long term (current) drug therapy: Secondary | ICD-10-CM | POA: Diagnosis not present

## 2018-01-29 DIAGNOSIS — D509 Iron deficiency anemia, unspecified: Secondary | ICD-10-CM | POA: Diagnosis not present

## 2018-01-29 DIAGNOSIS — E440 Moderate protein-calorie malnutrition: Secondary | ICD-10-CM | POA: Diagnosis not present

## 2018-01-29 DIAGNOSIS — R82998 Other abnormal findings in urine: Secondary | ICD-10-CM | POA: Diagnosis not present

## 2018-01-29 DIAGNOSIS — N186 End stage renal disease: Secondary | ICD-10-CM | POA: Diagnosis not present

## 2018-01-29 DIAGNOSIS — D631 Anemia in chronic kidney disease: Secondary | ICD-10-CM | POA: Diagnosis not present

## 2018-01-30 DIAGNOSIS — R17 Unspecified jaundice: Secondary | ICD-10-CM | POA: Diagnosis not present

## 2018-01-30 DIAGNOSIS — N186 End stage renal disease: Secondary | ICD-10-CM | POA: Diagnosis not present

## 2018-01-30 DIAGNOSIS — N2581 Secondary hyperparathyroidism of renal origin: Secondary | ICD-10-CM | POA: Diagnosis not present

## 2018-01-30 DIAGNOSIS — D631 Anemia in chronic kidney disease: Secondary | ICD-10-CM | POA: Diagnosis not present

## 2018-01-30 DIAGNOSIS — E440 Moderate protein-calorie malnutrition: Secondary | ICD-10-CM | POA: Diagnosis not present

## 2018-01-30 DIAGNOSIS — Z79899 Other long term (current) drug therapy: Secondary | ICD-10-CM | POA: Diagnosis not present

## 2018-01-30 DIAGNOSIS — R82998 Other abnormal findings in urine: Secondary | ICD-10-CM | POA: Diagnosis not present

## 2018-01-30 DIAGNOSIS — D509 Iron deficiency anemia, unspecified: Secondary | ICD-10-CM | POA: Diagnosis not present

## 2018-01-31 DIAGNOSIS — R82998 Other abnormal findings in urine: Secondary | ICD-10-CM | POA: Diagnosis not present

## 2018-01-31 DIAGNOSIS — N2581 Secondary hyperparathyroidism of renal origin: Secondary | ICD-10-CM | POA: Diagnosis not present

## 2018-01-31 DIAGNOSIS — D631 Anemia in chronic kidney disease: Secondary | ICD-10-CM | POA: Diagnosis not present

## 2018-01-31 DIAGNOSIS — E113211 Type 2 diabetes mellitus with mild nonproliferative diabetic retinopathy with macular edema, right eye: Secondary | ICD-10-CM | POA: Diagnosis not present

## 2018-01-31 DIAGNOSIS — H40023 Open angle with borderline findings, high risk, bilateral: Secondary | ICD-10-CM | POA: Diagnosis not present

## 2018-01-31 DIAGNOSIS — E440 Moderate protein-calorie malnutrition: Secondary | ICD-10-CM | POA: Diagnosis not present

## 2018-01-31 DIAGNOSIS — E113292 Type 2 diabetes mellitus with mild nonproliferative diabetic retinopathy without macular edema, left eye: Secondary | ICD-10-CM | POA: Diagnosis not present

## 2018-01-31 DIAGNOSIS — N186 End stage renal disease: Secondary | ICD-10-CM | POA: Diagnosis not present

## 2018-01-31 DIAGNOSIS — D509 Iron deficiency anemia, unspecified: Secondary | ICD-10-CM | POA: Diagnosis not present

## 2018-01-31 DIAGNOSIS — R17 Unspecified jaundice: Secondary | ICD-10-CM | POA: Diagnosis not present

## 2018-01-31 DIAGNOSIS — H35033 Hypertensive retinopathy, bilateral: Secondary | ICD-10-CM | POA: Diagnosis not present

## 2018-01-31 DIAGNOSIS — Z79899 Other long term (current) drug therapy: Secondary | ICD-10-CM | POA: Diagnosis not present

## 2018-01-31 LAB — HM DIABETES EYE EXAM

## 2018-02-01 ENCOUNTER — Encounter: Payer: Self-pay | Admitting: *Deleted

## 2018-02-01 DIAGNOSIS — R17 Unspecified jaundice: Secondary | ICD-10-CM | POA: Diagnosis not present

## 2018-02-01 DIAGNOSIS — N186 End stage renal disease: Secondary | ICD-10-CM | POA: Diagnosis not present

## 2018-02-01 DIAGNOSIS — Z79899 Other long term (current) drug therapy: Secondary | ICD-10-CM | POA: Diagnosis not present

## 2018-02-01 DIAGNOSIS — E440 Moderate protein-calorie malnutrition: Secondary | ICD-10-CM | POA: Diagnosis not present

## 2018-02-01 DIAGNOSIS — D631 Anemia in chronic kidney disease: Secondary | ICD-10-CM | POA: Diagnosis not present

## 2018-02-01 DIAGNOSIS — R82998 Other abnormal findings in urine: Secondary | ICD-10-CM | POA: Diagnosis not present

## 2018-02-01 DIAGNOSIS — N2581 Secondary hyperparathyroidism of renal origin: Secondary | ICD-10-CM | POA: Diagnosis not present

## 2018-02-01 DIAGNOSIS — D509 Iron deficiency anemia, unspecified: Secondary | ICD-10-CM | POA: Diagnosis not present

## 2018-02-02 DIAGNOSIS — E440 Moderate protein-calorie malnutrition: Secondary | ICD-10-CM | POA: Diagnosis not present

## 2018-02-02 DIAGNOSIS — N186 End stage renal disease: Secondary | ICD-10-CM | POA: Diagnosis not present

## 2018-02-02 DIAGNOSIS — Z79899 Other long term (current) drug therapy: Secondary | ICD-10-CM | POA: Diagnosis not present

## 2018-02-02 DIAGNOSIS — D509 Iron deficiency anemia, unspecified: Secondary | ICD-10-CM | POA: Diagnosis not present

## 2018-02-02 DIAGNOSIS — N2581 Secondary hyperparathyroidism of renal origin: Secondary | ICD-10-CM | POA: Diagnosis not present

## 2018-02-02 DIAGNOSIS — R17 Unspecified jaundice: Secondary | ICD-10-CM | POA: Diagnosis not present

## 2018-02-02 DIAGNOSIS — R82998 Other abnormal findings in urine: Secondary | ICD-10-CM | POA: Diagnosis not present

## 2018-02-02 DIAGNOSIS — D631 Anemia in chronic kidney disease: Secondary | ICD-10-CM | POA: Diagnosis not present

## 2018-02-03 DIAGNOSIS — N2581 Secondary hyperparathyroidism of renal origin: Secondary | ICD-10-CM | POA: Diagnosis not present

## 2018-02-03 DIAGNOSIS — E440 Moderate protein-calorie malnutrition: Secondary | ICD-10-CM | POA: Diagnosis not present

## 2018-02-03 DIAGNOSIS — R17 Unspecified jaundice: Secondary | ICD-10-CM | POA: Diagnosis not present

## 2018-02-03 DIAGNOSIS — D509 Iron deficiency anemia, unspecified: Secondary | ICD-10-CM | POA: Diagnosis not present

## 2018-02-03 DIAGNOSIS — R82998 Other abnormal findings in urine: Secondary | ICD-10-CM | POA: Diagnosis not present

## 2018-02-03 DIAGNOSIS — N186 End stage renal disease: Secondary | ICD-10-CM | POA: Diagnosis not present

## 2018-02-03 DIAGNOSIS — D631 Anemia in chronic kidney disease: Secondary | ICD-10-CM | POA: Diagnosis not present

## 2018-02-03 DIAGNOSIS — Z79899 Other long term (current) drug therapy: Secondary | ICD-10-CM | POA: Diagnosis not present

## 2018-02-04 DIAGNOSIS — E440 Moderate protein-calorie malnutrition: Secondary | ICD-10-CM | POA: Diagnosis not present

## 2018-02-04 DIAGNOSIS — N2581 Secondary hyperparathyroidism of renal origin: Secondary | ICD-10-CM | POA: Diagnosis not present

## 2018-02-04 DIAGNOSIS — N186 End stage renal disease: Secondary | ICD-10-CM | POA: Diagnosis not present

## 2018-02-04 DIAGNOSIS — Z79899 Other long term (current) drug therapy: Secondary | ICD-10-CM | POA: Diagnosis not present

## 2018-02-04 DIAGNOSIS — D631 Anemia in chronic kidney disease: Secondary | ICD-10-CM | POA: Diagnosis not present

## 2018-02-04 DIAGNOSIS — R17 Unspecified jaundice: Secondary | ICD-10-CM | POA: Diagnosis not present

## 2018-02-04 DIAGNOSIS — R82998 Other abnormal findings in urine: Secondary | ICD-10-CM | POA: Diagnosis not present

## 2018-02-04 DIAGNOSIS — D509 Iron deficiency anemia, unspecified: Secondary | ICD-10-CM | POA: Diagnosis not present

## 2018-02-05 DIAGNOSIS — R82998 Other abnormal findings in urine: Secondary | ICD-10-CM | POA: Diagnosis not present

## 2018-02-05 DIAGNOSIS — E440 Moderate protein-calorie malnutrition: Secondary | ICD-10-CM | POA: Diagnosis not present

## 2018-02-05 DIAGNOSIS — R17 Unspecified jaundice: Secondary | ICD-10-CM | POA: Diagnosis not present

## 2018-02-05 DIAGNOSIS — N186 End stage renal disease: Secondary | ICD-10-CM | POA: Diagnosis not present

## 2018-02-05 DIAGNOSIS — D509 Iron deficiency anemia, unspecified: Secondary | ICD-10-CM | POA: Diagnosis not present

## 2018-02-05 DIAGNOSIS — N2581 Secondary hyperparathyroidism of renal origin: Secondary | ICD-10-CM | POA: Diagnosis not present

## 2018-02-05 DIAGNOSIS — D631 Anemia in chronic kidney disease: Secondary | ICD-10-CM | POA: Diagnosis not present

## 2018-02-05 DIAGNOSIS — Z79899 Other long term (current) drug therapy: Secondary | ICD-10-CM | POA: Diagnosis not present

## 2018-02-06 DIAGNOSIS — R82998 Other abnormal findings in urine: Secondary | ICD-10-CM | POA: Diagnosis not present

## 2018-02-06 DIAGNOSIS — R17 Unspecified jaundice: Secondary | ICD-10-CM | POA: Diagnosis not present

## 2018-02-06 DIAGNOSIS — Z79899 Other long term (current) drug therapy: Secondary | ICD-10-CM | POA: Diagnosis not present

## 2018-02-06 DIAGNOSIS — D509 Iron deficiency anemia, unspecified: Secondary | ICD-10-CM | POA: Diagnosis not present

## 2018-02-06 DIAGNOSIS — N2581 Secondary hyperparathyroidism of renal origin: Secondary | ICD-10-CM | POA: Diagnosis not present

## 2018-02-06 DIAGNOSIS — E440 Moderate protein-calorie malnutrition: Secondary | ICD-10-CM | POA: Diagnosis not present

## 2018-02-06 DIAGNOSIS — D631 Anemia in chronic kidney disease: Secondary | ICD-10-CM | POA: Diagnosis not present

## 2018-02-06 DIAGNOSIS — N186 End stage renal disease: Secondary | ICD-10-CM | POA: Diagnosis not present

## 2018-02-07 DIAGNOSIS — D631 Anemia in chronic kidney disease: Secondary | ICD-10-CM | POA: Diagnosis not present

## 2018-02-07 DIAGNOSIS — N186 End stage renal disease: Secondary | ICD-10-CM | POA: Diagnosis not present

## 2018-02-07 DIAGNOSIS — R82998 Other abnormal findings in urine: Secondary | ICD-10-CM | POA: Diagnosis not present

## 2018-02-07 DIAGNOSIS — N2581 Secondary hyperparathyroidism of renal origin: Secondary | ICD-10-CM | POA: Diagnosis not present

## 2018-02-07 DIAGNOSIS — R17 Unspecified jaundice: Secondary | ICD-10-CM | POA: Diagnosis not present

## 2018-02-07 DIAGNOSIS — E440 Moderate protein-calorie malnutrition: Secondary | ICD-10-CM | POA: Diagnosis not present

## 2018-02-07 DIAGNOSIS — D509 Iron deficiency anemia, unspecified: Secondary | ICD-10-CM | POA: Diagnosis not present

## 2018-02-07 DIAGNOSIS — Z79899 Other long term (current) drug therapy: Secondary | ICD-10-CM | POA: Diagnosis not present

## 2018-02-08 DIAGNOSIS — R17 Unspecified jaundice: Secondary | ICD-10-CM | POA: Diagnosis not present

## 2018-02-08 DIAGNOSIS — N2581 Secondary hyperparathyroidism of renal origin: Secondary | ICD-10-CM | POA: Diagnosis not present

## 2018-02-08 DIAGNOSIS — D509 Iron deficiency anemia, unspecified: Secondary | ICD-10-CM | POA: Diagnosis not present

## 2018-02-08 DIAGNOSIS — N186 End stage renal disease: Secondary | ICD-10-CM | POA: Diagnosis not present

## 2018-02-08 DIAGNOSIS — D631 Anemia in chronic kidney disease: Secondary | ICD-10-CM | POA: Diagnosis not present

## 2018-02-08 DIAGNOSIS — R82998 Other abnormal findings in urine: Secondary | ICD-10-CM | POA: Diagnosis not present

## 2018-02-08 DIAGNOSIS — Z79899 Other long term (current) drug therapy: Secondary | ICD-10-CM | POA: Diagnosis not present

## 2018-02-08 DIAGNOSIS — E440 Moderate protein-calorie malnutrition: Secondary | ICD-10-CM | POA: Diagnosis not present

## 2018-02-09 DIAGNOSIS — N186 End stage renal disease: Secondary | ICD-10-CM | POA: Diagnosis not present

## 2018-02-09 DIAGNOSIS — R82998 Other abnormal findings in urine: Secondary | ICD-10-CM | POA: Diagnosis not present

## 2018-02-09 DIAGNOSIS — N2581 Secondary hyperparathyroidism of renal origin: Secondary | ICD-10-CM | POA: Diagnosis not present

## 2018-02-09 DIAGNOSIS — D509 Iron deficiency anemia, unspecified: Secondary | ICD-10-CM | POA: Diagnosis not present

## 2018-02-09 DIAGNOSIS — D631 Anemia in chronic kidney disease: Secondary | ICD-10-CM | POA: Diagnosis not present

## 2018-02-09 DIAGNOSIS — R17 Unspecified jaundice: Secondary | ICD-10-CM | POA: Diagnosis not present

## 2018-02-09 DIAGNOSIS — Z79899 Other long term (current) drug therapy: Secondary | ICD-10-CM | POA: Diagnosis not present

## 2018-02-09 DIAGNOSIS — E440 Moderate protein-calorie malnutrition: Secondary | ICD-10-CM | POA: Diagnosis not present

## 2018-02-10 DIAGNOSIS — R17 Unspecified jaundice: Secondary | ICD-10-CM | POA: Diagnosis not present

## 2018-02-10 DIAGNOSIS — D509 Iron deficiency anemia, unspecified: Secondary | ICD-10-CM | POA: Diagnosis not present

## 2018-02-10 DIAGNOSIS — Z79899 Other long term (current) drug therapy: Secondary | ICD-10-CM | POA: Diagnosis not present

## 2018-02-10 DIAGNOSIS — N186 End stage renal disease: Secondary | ICD-10-CM | POA: Diagnosis not present

## 2018-02-10 DIAGNOSIS — D631 Anemia in chronic kidney disease: Secondary | ICD-10-CM | POA: Diagnosis not present

## 2018-02-10 DIAGNOSIS — R82998 Other abnormal findings in urine: Secondary | ICD-10-CM | POA: Diagnosis not present

## 2018-02-10 DIAGNOSIS — N2581 Secondary hyperparathyroidism of renal origin: Secondary | ICD-10-CM | POA: Diagnosis not present

## 2018-02-10 DIAGNOSIS — E440 Moderate protein-calorie malnutrition: Secondary | ICD-10-CM | POA: Diagnosis not present

## 2018-02-11 DIAGNOSIS — N186 End stage renal disease: Secondary | ICD-10-CM | POA: Diagnosis not present

## 2018-02-11 DIAGNOSIS — R17 Unspecified jaundice: Secondary | ICD-10-CM | POA: Diagnosis not present

## 2018-02-11 DIAGNOSIS — N2581 Secondary hyperparathyroidism of renal origin: Secondary | ICD-10-CM | POA: Diagnosis not present

## 2018-02-11 DIAGNOSIS — R82998 Other abnormal findings in urine: Secondary | ICD-10-CM | POA: Diagnosis not present

## 2018-02-11 DIAGNOSIS — D631 Anemia in chronic kidney disease: Secondary | ICD-10-CM | POA: Diagnosis not present

## 2018-02-11 DIAGNOSIS — D509 Iron deficiency anemia, unspecified: Secondary | ICD-10-CM | POA: Diagnosis not present

## 2018-02-11 DIAGNOSIS — Z79899 Other long term (current) drug therapy: Secondary | ICD-10-CM | POA: Diagnosis not present

## 2018-02-11 DIAGNOSIS — E440 Moderate protein-calorie malnutrition: Secondary | ICD-10-CM | POA: Diagnosis not present

## 2018-02-12 ENCOUNTER — Other Ambulatory Visit: Payer: Self-pay | Admitting: Internal Medicine

## 2018-02-12 DIAGNOSIS — D631 Anemia in chronic kidney disease: Secondary | ICD-10-CM | POA: Diagnosis not present

## 2018-02-12 DIAGNOSIS — R82998 Other abnormal findings in urine: Secondary | ICD-10-CM | POA: Diagnosis not present

## 2018-02-12 DIAGNOSIS — R17 Unspecified jaundice: Secondary | ICD-10-CM | POA: Diagnosis not present

## 2018-02-12 DIAGNOSIS — Z1231 Encounter for screening mammogram for malignant neoplasm of breast: Secondary | ICD-10-CM

## 2018-02-12 DIAGNOSIS — E440 Moderate protein-calorie malnutrition: Secondary | ICD-10-CM | POA: Diagnosis not present

## 2018-02-12 DIAGNOSIS — N2581 Secondary hyperparathyroidism of renal origin: Secondary | ICD-10-CM | POA: Diagnosis not present

## 2018-02-12 DIAGNOSIS — N186 End stage renal disease: Secondary | ICD-10-CM | POA: Diagnosis not present

## 2018-02-12 DIAGNOSIS — Z79899 Other long term (current) drug therapy: Secondary | ICD-10-CM | POA: Diagnosis not present

## 2018-02-12 DIAGNOSIS — D509 Iron deficiency anemia, unspecified: Secondary | ICD-10-CM | POA: Diagnosis not present

## 2018-02-13 DIAGNOSIS — E440 Moderate protein-calorie malnutrition: Secondary | ICD-10-CM | POA: Diagnosis not present

## 2018-02-13 DIAGNOSIS — I700 Atherosclerosis of aorta: Secondary | ICD-10-CM | POA: Insufficient documentation

## 2018-02-13 DIAGNOSIS — D509 Iron deficiency anemia, unspecified: Secondary | ICD-10-CM | POA: Diagnosis not present

## 2018-02-13 DIAGNOSIS — Z79899 Other long term (current) drug therapy: Secondary | ICD-10-CM | POA: Diagnosis not present

## 2018-02-13 DIAGNOSIS — R82998 Other abnormal findings in urine: Secondary | ICD-10-CM | POA: Diagnosis not present

## 2018-02-13 DIAGNOSIS — N186 End stage renal disease: Secondary | ICD-10-CM | POA: Diagnosis not present

## 2018-02-13 DIAGNOSIS — R17 Unspecified jaundice: Secondary | ICD-10-CM | POA: Diagnosis not present

## 2018-02-13 DIAGNOSIS — D631 Anemia in chronic kidney disease: Secondary | ICD-10-CM | POA: Diagnosis not present

## 2018-02-13 DIAGNOSIS — N2581 Secondary hyperparathyroidism of renal origin: Secondary | ICD-10-CM | POA: Diagnosis not present

## 2018-02-13 NOTE — Progress Notes (Signed)
[facility] Unk Pinto, M.D.     Uvaldo Bristle. Silverio Lay, P.A.-C Liane Comber, Jansen [address] [place] [address] Telephone [phone] Telefax [phone] Annual Screening/Preventative Visit & Comprehensive Evaluation &  Examination     This very nice 77 y.o. DBF presents for a Screening/Preventative Visit & comprehensive evaluation and management of multiple medical co-morbidities.  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 Patient has Gout controlled on her meds.       HTN predates since 1990's. Patient's BP has been controlled at home and patient denies any cardiac symptoms as chest pain, palpitations, shortness of breath, dizziness or ankle swelling. Today's BP: is at goal - 138/72.      Patient's hyperlipidemia is not controlled with diet and medications. Patient denies myalgias or other medication SE's. 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 Patient has consequent ESRD and has been on home peritoneal dialysis since Mar 2017 followed closely by Dr Raymondo Band. Patient denies reactive hypoglycemic symptoms, visual blurring, diabetic polys, or paresthesias. Last A1c was not at goal: Lab Results  Component Value Date   HGBA1C 6.9 (H) 11/16/2017      Finally, patient has history of Vitamin D Deficiency  ("39"/2014 and "31"/2016) and last Vitamin D was still low: Lab Results  Component Value Date   VD25OH 38 11/16/2017   Current Outpatient Medications on File Prior to Visit  Medication Sig  . allopurinol (ZYLOPRIM) 300 MG tablet TAKE 1/2 TO 1 TABLET BY  MOUTH DAILY AS DIRECTED  . aspirin 81 MG tablet  Take 81 mg by mouth at bedtime.   Marland Kitchen b complex-vitamin c-folic acid (NEPHRO-VITE) 0.8 MG TABS tablet Take 1 tablet by mouth at bedtime.  . calcitRIOL (ROCALTROL) 0.5 MCG capsule Take 0.5 mcg by mouth every other day.  . calcium acetate (PHOSLO) 667 MG capsule Take 667 mg by mouth 3 (three) times daily with meals.   . Cholecalciferol (VITAMIN D-3 PO) Take 5,000 Units by mouth every other day.   . cinacalcet (SENSIPAR) 30 MG tablet Take 30 mg by mouth daily.  Marland Kitchen docusate sodium (COLACE) 100 MG capsule Take 100 mg by mouth 2 (two) times daily.  . Flaxseed, Linseed, (FLAXSEED OIL) 1000 MG CAPS Take 1 capsule by mouth daily.   Marland Kitchen glucose blood (ACCU-CHEK AVIVA PLUS) test strip Test once daily  . hydrALAZINE (APRESOLINE) 10 MG tablet TAKE 2 TABLETS BY MOUTH 2  TIMES DAILY. (Patient taking differently: Take 20 mg by mouth 2 (two) times daily. TAKE 2 TABLETS BY MOUTH 2  TIMES DAILY.)  . labetalol (NORMODYNE) 200 MG tablet Take 1 tablet (200 mg total) by mouth 2 (two) times daily.  . Omega-3 Fatty Acids (FISH OIL) 1200 MG CAPS Take 2 capsules by mouth daily.  . sevelamer carbonate (RENVELA) 800 MG tablet Take 1,600 mg by mouth 3 (three) times daily with meals.   . zinc gluconate 50 MG tablet Take 50 mg by mouth every morning.   No current facility-administered medications on file prior to visit.    Allergies  Allergen Reactions  . Ace Inhibitors     unknown  .  Nsaids     unknown   Past Medical History:  Diagnosis Date  . Anemia   . Arthritis    Osteoarthritis  . Chronic kidney disease    Chronic renal insufficiency  . Diabetes mellitus    Pre  . Diverticulosis   . GERD (gastroesophageal reflux disease)   . Gout   . Hiatal hernia   . History of blood transfusion   . Hyperlipidemia   . Hypertension   . Osteopenia   . Pancreatic cyst 1999  . Thyroid disease    Hyperparathyroid    Health Maintenance  Topic Date Due  . URINE MICROALBUMIN  12/08/2017  . HEMOGLOBIN A1C  05/17/2018  .  OPHTHALMOLOGY EXAM  02/01/2019  . FOOT EXAM  02/14/2019  . TETANUS/TDAP  06/29/2024  . INFLUENZA VACCINE  Completed  . DEXA SCAN  Completed  . PNA vac Low Risk Adult  Completed   Immunization History  Administered Date(s) Administered  . DT 02/05/2014  . Influenza, High Dose Seasonal PF 09/25/2013  . Influenza-Unspecified 08/11/2015, 07/21/2017  . Pneumococcal Polysaccharide-23 11/20/2009  . Td 11/21/2003  . Zoster 11/20/2005   Last Colon - 05.12.2013 - Dr Arty Baumgartner Last MGM - 04.13.2018 and due again next month.  Past Surgical History:  Procedure Laterality Date  . CHOLECYSTECTOMY    . COLONOSCOPY  03/21/12   Next one in 2018  . EYE SURGERY Left    cataract extraction with IOL  . INSERTION OF DIALYSIS CATHETER N/A 02/15/2017   Procedure: REVISION OF DIALYSIS CATHETER;  Surgeon: Algernon Huxley, MD;  Location: [facility] ORS;  Service: Cardiovascular;  Laterality: N/A;  . JOINT REPLACEMENT  2012   left knee  . left knee replacement     . PANCREATIC CYST EXCISION  1999  . TOTAL HIP ARTHROPLASTY Right 05/26/2015   Procedure: RIGHT TOTAL HIP ARTHROPLASTY ANTERIOR APPROACH;  Surgeon: Gaynelle Arabian, MD;  Location: [facility];  Service: Orthopedics;  Laterality: Right;  . TOTAL KNEE ARTHROPLASTY Right 06/09/2013   Procedure: RIGHT TOTAL KNEE ARTHROPLASTY;  Surgeon: Gearlean Alf, MD;  Location: [facility];  Service: Orthopedics;  Laterality: Right;   Family History  Problem Relation Age of Onset  . Cancer Father        Prostate and Throat  . Hypertension Mother   . Cancer Mother        Colon with METS  . Diabetes Mother    Social History   Tobacco Use  . Smoking status: Never Smoker  . Smokeless tobacco: Never Used  Substance Use Topics  . Alcohol use: No    Alcohol/week: 0.0 oz  . Drug use: No    ROS Constitutional: Denies fever, chills, weight loss/gain, headaches, insomnia,  night sweats, and change in appetite. Does c/o fatigue. Eyes: Denies redness, blurred vision, diplopia,  discharge, itchy, watery eyes.  ENT: Denies discharge, congestion, post nasal drip, epistaxis, sore throat, earache, hearing loss, dental pain, Tinnitus, Vertigo, Sinus pain, snoring.  Cardio: Denies chest pain, palpitations, irregular heartbeat, syncope, dyspnea, diaphoresis, orthopnea, PND, claudication, edema Respiratory: denies cough, dyspnea, DOE, pleurisy, hoarseness, laryngitis, wheezing.  Gastrointestinal: Denies dysphagia, heartburn, reflux, water brash, pain, cramps, nausea, vomiting, bloating, diarrhea, constipation, hematemesis, melena, hematochezia, jaundice, hemorrhoids Genitourinary: Denies dysuria, frequency, urgency, nocturia, hesitancy, discharge, hematuria, flank pain Breast: Breast lumps, nipple discharge, bleeding.  Musculoskeletal: Denies arthralgia, myalgia, stiffness, Jt. Swelling, pain, limp, and strain/sprain. Denies falls. Skin: Denies puritis, rash, hives, warts, acne, eczema, changing in skin lesion Neuro: No weakness, tremor, incoordination,  spasms, paresthesia, pain Psychiatric: Denies confusion, memory loss, sensory loss. Denies Depression. Endocrine: Denies change in weight, skin, hair change, nocturia, and paresthesia, diabetic polys, visual blurring, hyper / hypo glycemic episodes.  Heme/Lymph: No excessive bleeding, bruising, enlarged lymph nodes.  Physical Exam  BP 138/72   Pulse 72   Temp (!) 97.3 F (36.3 C)   Resp 16   Ht 5\' 1"  (1.549 m)   Wt 147 lb 12.8 oz (67 kg)   BMI 27.93 kg/m   General Appearance: Well nourished, well groomed and in no apparent distress.  Eyes: PERRLA, EOMs, conjunctiva no swelling or erythema, normal fundi and vessels. Sinuses: No frontal/maxillary tenderness ENT/Mouth: EACs patent / TMs  nl. Nares clear without erythema, swelling, mucoid exudates. Oral hygiene is good. No erythema, swelling, or exudate. Tongue normal, non-obstructing. Tonsils not swollen or erythematous. Hearing normal.  Neck: Supple, thyroid not  palpable. No bruits, nodes or JVD. Respiratory: Respiratory effort normal.  BS equal and clear bilateral without rales, rhonci, wheezing or stridor. Cardio: Heart sounds are normal with regular rate and rhythm and no murmurs, rubs or gallops. Peripheral pulses are normal and equal bilaterally without edema. No aortic or femoral bruits. Chest: symmetric with normal excursions and percussion. Breasts: Symmetric, without lumps, nipple discharge, retractions, or fibrocystic changes.  Abdomen: Flat, soft with bowel sounds active. Nontender, no guarding, rebound, hernias, masses, or organomegaly.  Lymphatics: Non tender without lymphadenopathy.  Genitourinary:  Musculoskeletal: Full ROM all peripheral extremities, joint stability, 5/5 strength, and normal gait. Skin: Warm and dry without rashes, lesions, cyanosis, clubbing or  ecchymosis.  Neuro: Cranial nerves intact, reflexes equal bilaterally. Normal muscle tone, no cerebellar symptoms. Sensation intact to touch, vibratory and Monofilament to the toes bilaterally. Pysch: Alert and oriented X 3, normal affect, Insight and Judgment appropriate.   Assessment and Plan  1. Annual Preventative Screening Examination  2. Essential hypertension  - EKG 12-Lead - CBC with Differential/Platelet - BASIC METABOLIC PANEL WITH GFR - Magnesium - TSH  3. Hyperlipidemia, mixed  - EKG 12-Lead - Hepatic function panel - Lipid panel - TSH  4. 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 Vitamin D deficiency  - VITAMIN D 25 Hydroxyl  6. ESRD (end stage renal disease) (HCC)  - Urinalysis, Routine w reflex microscopic - Microalbumin / creatinine urine ratio -  BASIC METABOLIC PANEL WITH GFR  7. Anemia of chronic Renal Dz  - CBC with Differential/Platelet  8. Idiopathic gout  - Uric acid  9. Secondary hyperparathyroidism (CKD)   10. GERD  - CBC with Differential/Platelet  11. Screening for colorectal cancer  - confirm colonoscopy  12. Screening for ischemic heart disease  - EKG 12-Lead  13. FH: hypertension  - EKG 12-Lead  14. Aortic atherosclerosis (HCC)  - EKG 12-Lead  15. Medication management  - Uric acid - CBC with Differential/Platelet - BASIC METABOLIC PANEL WITH GFR - Hepatic function panel - Magnesium - Lipid panel - TSH - Hemoglobin A1c - Insulin, random - VITAMIN D 25 Hydroxyl           Patient was counseled in prudent diet to achieve/maintain BMI less than 25 for weight control, BP monitoring, regular exercise and medications. Discussed med's effects and  SE's. Screening labs and tests as requested with regular follow-up as recommended. Over 40 minutes of exam, counseling, chart review and high complex critical decision making was performed.

## 2018-02-13 NOTE — Patient Instructions (Signed)

## 2018-02-14 ENCOUNTER — Ambulatory Visit (INDEPENDENT_AMBULATORY_CARE_PROVIDER_SITE_OTHER): Payer: Medicare Other | Admitting: Internal Medicine

## 2018-02-14 ENCOUNTER — Encounter: Payer: Self-pay | Admitting: Internal Medicine

## 2018-02-14 VITALS — BP 138/72 | HR 72 | Temp 97.3°F | Resp 16 | Ht 61.0 in | Wt 147.8 lb

## 2018-02-14 DIAGNOSIS — D631 Anemia in chronic kidney disease: Secondary | ICD-10-CM | POA: Diagnosis not present

## 2018-02-14 DIAGNOSIS — M1000 Idiopathic gout, unspecified site: Secondary | ICD-10-CM

## 2018-02-14 DIAGNOSIS — N186 End stage renal disease: Secondary | ICD-10-CM

## 2018-02-14 DIAGNOSIS — I10 Essential (primary) hypertension: Secondary | ICD-10-CM

## 2018-02-14 DIAGNOSIS — E559 Vitamin D deficiency, unspecified: Secondary | ICD-10-CM | POA: Diagnosis not present

## 2018-02-14 DIAGNOSIS — Z79899 Other long term (current) drug therapy: Secondary | ICD-10-CM | POA: Diagnosis not present

## 2018-02-14 DIAGNOSIS — K2100 Gastro-esophageal reflux disease with esophagitis, without bleeding: Secondary | ICD-10-CM

## 2018-02-14 DIAGNOSIS — E0822 Diabetes mellitus due to underlying condition with diabetic chronic kidney disease: Secondary | ICD-10-CM | POA: Diagnosis not present

## 2018-02-14 DIAGNOSIS — N2581 Secondary hyperparathyroidism of renal origin: Secondary | ICD-10-CM | POA: Diagnosis not present

## 2018-02-14 DIAGNOSIS — Z992 Dependence on renal dialysis: Secondary | ICD-10-CM

## 2018-02-14 DIAGNOSIS — Z8249 Family history of ischemic heart disease and other diseases of the circulatory system: Secondary | ICD-10-CM

## 2018-02-14 DIAGNOSIS — R82998 Other abnormal findings in urine: Secondary | ICD-10-CM | POA: Diagnosis not present

## 2018-02-14 DIAGNOSIS — Z0001 Encounter for general adult medical examination with abnormal findings: Secondary | ICD-10-CM

## 2018-02-14 DIAGNOSIS — D638 Anemia in other chronic diseases classified elsewhere: Secondary | ICD-10-CM

## 2018-02-14 DIAGNOSIS — R17 Unspecified jaundice: Secondary | ICD-10-CM | POA: Diagnosis not present

## 2018-02-14 DIAGNOSIS — Z0000 Encounter for general adult medical examination without abnormal findings: Secondary | ICD-10-CM

## 2018-02-14 DIAGNOSIS — Z1212 Encounter for screening for malignant neoplasm of rectum: Secondary | ICD-10-CM

## 2018-02-14 DIAGNOSIS — E782 Mixed hyperlipidemia: Secondary | ICD-10-CM | POA: Diagnosis not present

## 2018-02-14 DIAGNOSIS — Z136 Encounter for screening for cardiovascular disorders: Secondary | ICD-10-CM

## 2018-02-14 DIAGNOSIS — Z1211 Encounter for screening for malignant neoplasm of colon: Secondary | ICD-10-CM

## 2018-02-14 DIAGNOSIS — E440 Moderate protein-calorie malnutrition: Secondary | ICD-10-CM | POA: Diagnosis not present

## 2018-02-14 DIAGNOSIS — I700 Atherosclerosis of aorta: Secondary | ICD-10-CM

## 2018-02-14 DIAGNOSIS — D509 Iron deficiency anemia, unspecified: Secondary | ICD-10-CM | POA: Diagnosis not present

## 2018-02-15 ENCOUNTER — Other Ambulatory Visit: Payer: Self-pay | Admitting: Internal Medicine

## 2018-02-15 DIAGNOSIS — N2581 Secondary hyperparathyroidism of renal origin: Secondary | ICD-10-CM | POA: Diagnosis not present

## 2018-02-15 DIAGNOSIS — N186 End stage renal disease: Secondary | ICD-10-CM | POA: Diagnosis not present

## 2018-02-15 DIAGNOSIS — D631 Anemia in chronic kidney disease: Secondary | ICD-10-CM | POA: Diagnosis not present

## 2018-02-15 DIAGNOSIS — R82998 Other abnormal findings in urine: Secondary | ICD-10-CM | POA: Diagnosis not present

## 2018-02-15 DIAGNOSIS — Z79899 Other long term (current) drug therapy: Secondary | ICD-10-CM | POA: Diagnosis not present

## 2018-02-15 DIAGNOSIS — D509 Iron deficiency anemia, unspecified: Secondary | ICD-10-CM | POA: Diagnosis not present

## 2018-02-15 DIAGNOSIS — R17 Unspecified jaundice: Secondary | ICD-10-CM | POA: Diagnosis not present

## 2018-02-15 DIAGNOSIS — E440 Moderate protein-calorie malnutrition: Secondary | ICD-10-CM | POA: Diagnosis not present

## 2018-02-15 LAB — CBC WITH DIFFERENTIAL/PLATELET
BASOS ABS: 57 {cells}/uL (ref 0–200)
Basophils Relative: 0.7 %
EOS ABS: 230 {cells}/uL (ref 15–500)
EOS PCT: 2.8 %
HEMATOCRIT: 34.3 % — AB (ref 35.0–45.0)
HEMOGLOBIN: 11.3 g/dL — AB (ref 11.7–15.5)
LYMPHS ABS: 2255 {cells}/uL (ref 850–3900)
MCH: 31.1 pg (ref 27.0–33.0)
MCHC: 32.9 g/dL (ref 32.0–36.0)
MCV: 94.5 fL (ref 80.0–100.0)
MPV: 10.1 fL (ref 7.5–12.5)
Monocytes Relative: 5.3 %
NEUTROS ABS: 5223 {cells}/uL (ref 1500–7800)
NEUTROS PCT: 63.7 %
Platelets: 260 10*3/uL (ref 140–400)
RBC: 3.63 10*6/uL — ABNORMAL LOW (ref 3.80–5.10)
RDW: 14.1 % (ref 11.0–15.0)
Total Lymphocyte: 27.5 %
WBC mixed population: 435 cells/uL (ref 200–950)
WBC: 8.2 10*3/uL (ref 3.8–10.8)

## 2018-02-15 LAB — HEPATIC FUNCTION PANEL
AG Ratio: 1.3 (calc) (ref 1.0–2.5)
ALT: 20 U/L (ref 6–29)
AST: 20 U/L (ref 10–35)
Albumin: 3.9 g/dL (ref 3.6–5.1)
Alkaline phosphatase (APISO): 179 U/L — ABNORMAL HIGH (ref 33–130)
BILIRUBIN DIRECT: 0 mg/dL (ref 0.0–0.2)
BILIRUBIN INDIRECT: 0.4 mg/dL (ref 0.2–1.2)
Globulin: 3 g/dL (calc) (ref 1.9–3.7)
Total Bilirubin: 0.4 mg/dL (ref 0.2–1.2)
Total Protein: 6.9 g/dL (ref 6.1–8.1)

## 2018-02-15 LAB — URIC ACID: URIC ACID, SERUM: 2.9 mg/dL (ref 2.5–7.0)

## 2018-02-15 LAB — LIPID PANEL
Cholesterol: 227 mg/dL — ABNORMAL HIGH (ref <200)
HDL: 56 mg/dL (ref >50)
LDL Cholesterol (Calc): 137 mg/dL (calc) — ABNORMAL HIGH
Non-HDL Cholesterol (Calc): 171 mg/dL (calc) — ABNORMAL HIGH (ref <130)
Total CHOL/HDL Ratio: 4.1 (calc) (ref <5.0)
Triglycerides: 206 mg/dL — ABNORMAL HIGH (ref <150)

## 2018-02-15 LAB — INSULIN, RANDOM: INSULIN: 3.6 u[IU]/mL (ref 2.0–19.6)

## 2018-02-15 LAB — VITAMIN D 25 HYDROXY (VIT D DEFICIENCY, FRACTURES): Vit D, 25-Hydroxy: 31 ng/mL (ref 30–100)

## 2018-02-15 LAB — TSH: TSH: 2.32 m[IU]/L (ref 0.40–4.50)

## 2018-02-15 LAB — BASIC METABOLIC PANEL WITH GFR
BUN / CREAT RATIO: 7 (calc) (ref 6–22)
BUN: 74 mg/dL — ABNORMAL HIGH (ref 7–25)
CHLORIDE: 95 mmol/L — AB (ref 98–110)
CO2: 31 mmol/L (ref 20–32)
Calcium: 9.6 mg/dL (ref 8.6–10.4)
Creat: 10.79 mg/dL — ABNORMAL HIGH (ref 0.60–0.93)
GFR, EST AFRICAN AMERICAN: 4 mL/min/{1.73_m2} — AB (ref >=60)
GFR, Est Non African American: 3 mL/min/{1.73_m2} — ABNORMAL LOW (ref >=60)
Glucose, Bld: 130 mg/dL — ABNORMAL HIGH (ref 65–99)
POTASSIUM: 4 mmol/L (ref 3.5–5.3)
SODIUM: 140 mmol/L (ref 135–146)

## 2018-02-15 LAB — HEMOGLOBIN A1C
EAG (MMOL/L): 6.8 (calc)
HEMOGLOBIN A1C: 5.9 %{Hb} — AB (ref <5.7)
Mean Plasma Glucose: 123 (calc)

## 2018-02-15 LAB — MAGNESIUM: Magnesium: 2 mg/dL (ref 1.5–2.5)

## 2018-02-15 MED ORDER — ROSUVASTATIN CALCIUM 40 MG PO TABS: ORAL_TABLET | ORAL | 5 refills | Status: DC

## 2018-02-16 DIAGNOSIS — E440 Moderate protein-calorie malnutrition: Secondary | ICD-10-CM | POA: Diagnosis not present

## 2018-02-16 DIAGNOSIS — R17 Unspecified jaundice: Secondary | ICD-10-CM | POA: Diagnosis not present

## 2018-02-16 DIAGNOSIS — D631 Anemia in chronic kidney disease: Secondary | ICD-10-CM | POA: Diagnosis not present

## 2018-02-16 DIAGNOSIS — N2581 Secondary hyperparathyroidism of renal origin: Secondary | ICD-10-CM | POA: Diagnosis not present

## 2018-02-16 DIAGNOSIS — Z79899 Other long term (current) drug therapy: Secondary | ICD-10-CM | POA: Diagnosis not present

## 2018-02-16 DIAGNOSIS — N186 End stage renal disease: Secondary | ICD-10-CM | POA: Diagnosis not present

## 2018-02-16 DIAGNOSIS — D509 Iron deficiency anemia, unspecified: Secondary | ICD-10-CM | POA: Diagnosis not present

## 2018-02-16 DIAGNOSIS — R82998 Other abnormal findings in urine: Secondary | ICD-10-CM | POA: Diagnosis not present

## 2018-02-17 DIAGNOSIS — N2581 Secondary hyperparathyroidism of renal origin: Secondary | ICD-10-CM | POA: Diagnosis not present

## 2018-02-17 DIAGNOSIS — E440 Moderate protein-calorie malnutrition: Secondary | ICD-10-CM | POA: Diagnosis not present

## 2018-02-17 DIAGNOSIS — Z79899 Other long term (current) drug therapy: Secondary | ICD-10-CM | POA: Diagnosis not present

## 2018-02-17 DIAGNOSIS — R82998 Other abnormal findings in urine: Secondary | ICD-10-CM | POA: Diagnosis not present

## 2018-02-17 DIAGNOSIS — N186 End stage renal disease: Secondary | ICD-10-CM | POA: Diagnosis not present

## 2018-02-17 DIAGNOSIS — R17 Unspecified jaundice: Secondary | ICD-10-CM | POA: Diagnosis not present

## 2018-02-17 DIAGNOSIS — D509 Iron deficiency anemia, unspecified: Secondary | ICD-10-CM | POA: Diagnosis not present

## 2018-02-17 DIAGNOSIS — D631 Anemia in chronic kidney disease: Secondary | ICD-10-CM | POA: Diagnosis not present

## 2018-02-18 DIAGNOSIS — D631 Anemia in chronic kidney disease: Secondary | ICD-10-CM | POA: Diagnosis not present

## 2018-02-18 DIAGNOSIS — N186 End stage renal disease: Secondary | ICD-10-CM | POA: Diagnosis not present

## 2018-02-18 DIAGNOSIS — Z4932 Encounter for adequacy testing for peritoneal dialysis: Secondary | ICD-10-CM | POA: Diagnosis not present

## 2018-02-18 DIAGNOSIS — N2589 Other disorders resulting from impaired renal tubular function: Secondary | ICD-10-CM | POA: Diagnosis not present

## 2018-02-18 DIAGNOSIS — R82998 Other abnormal findings in urine: Secondary | ICD-10-CM | POA: Diagnosis not present

## 2018-02-18 DIAGNOSIS — N2581 Secondary hyperparathyroidism of renal origin: Secondary | ICD-10-CM | POA: Diagnosis not present

## 2018-02-18 DIAGNOSIS — D509 Iron deficiency anemia, unspecified: Secondary | ICD-10-CM | POA: Diagnosis not present

## 2018-02-18 DIAGNOSIS — E7849 Other hyperlipidemia: Secondary | ICD-10-CM | POA: Diagnosis not present

## 2018-02-18 DIAGNOSIS — E119 Type 2 diabetes mellitus without complications: Secondary | ICD-10-CM | POA: Diagnosis not present

## 2018-02-19 DIAGNOSIS — D509 Iron deficiency anemia, unspecified: Secondary | ICD-10-CM | POA: Diagnosis not present

## 2018-02-19 DIAGNOSIS — R82998 Other abnormal findings in urine: Secondary | ICD-10-CM | POA: Diagnosis not present

## 2018-02-19 DIAGNOSIS — N2581 Secondary hyperparathyroidism of renal origin: Secondary | ICD-10-CM | POA: Diagnosis not present

## 2018-02-19 DIAGNOSIS — E119 Type 2 diabetes mellitus without complications: Secondary | ICD-10-CM | POA: Diagnosis not present

## 2018-02-19 DIAGNOSIS — Z4932 Encounter for adequacy testing for peritoneal dialysis: Secondary | ICD-10-CM | POA: Diagnosis not present

## 2018-02-19 DIAGNOSIS — N2589 Other disorders resulting from impaired renal tubular function: Secondary | ICD-10-CM | POA: Diagnosis not present

## 2018-02-19 DIAGNOSIS — D631 Anemia in chronic kidney disease: Secondary | ICD-10-CM | POA: Diagnosis not present

## 2018-02-19 DIAGNOSIS — E7849 Other hyperlipidemia: Secondary | ICD-10-CM | POA: Diagnosis not present

## 2018-02-19 DIAGNOSIS — N186 End stage renal disease: Secondary | ICD-10-CM | POA: Diagnosis not present

## 2018-02-20 DIAGNOSIS — N2589 Other disorders resulting from impaired renal tubular function: Secondary | ICD-10-CM | POA: Diagnosis not present

## 2018-02-20 DIAGNOSIS — D509 Iron deficiency anemia, unspecified: Secondary | ICD-10-CM | POA: Diagnosis not present

## 2018-02-20 DIAGNOSIS — R82998 Other abnormal findings in urine: Secondary | ICD-10-CM | POA: Diagnosis not present

## 2018-02-20 DIAGNOSIS — E7849 Other hyperlipidemia: Secondary | ICD-10-CM | POA: Diagnosis not present

## 2018-02-20 DIAGNOSIS — N186 End stage renal disease: Secondary | ICD-10-CM | POA: Diagnosis not present

## 2018-02-20 DIAGNOSIS — Z4932 Encounter for adequacy testing for peritoneal dialysis: Secondary | ICD-10-CM | POA: Diagnosis not present

## 2018-02-20 DIAGNOSIS — N2581 Secondary hyperparathyroidism of renal origin: Secondary | ICD-10-CM | POA: Diagnosis not present

## 2018-02-20 DIAGNOSIS — E119 Type 2 diabetes mellitus without complications: Secondary | ICD-10-CM | POA: Diagnosis not present

## 2018-02-20 DIAGNOSIS — D631 Anemia in chronic kidney disease: Secondary | ICD-10-CM | POA: Diagnosis not present

## 2018-02-21 DIAGNOSIS — Z4932 Encounter for adequacy testing for peritoneal dialysis: Secondary | ICD-10-CM | POA: Diagnosis not present

## 2018-02-21 DIAGNOSIS — N2581 Secondary hyperparathyroidism of renal origin: Secondary | ICD-10-CM | POA: Diagnosis not present

## 2018-02-21 DIAGNOSIS — N2589 Other disorders resulting from impaired renal tubular function: Secondary | ICD-10-CM | POA: Diagnosis not present

## 2018-02-21 DIAGNOSIS — R82998 Other abnormal findings in urine: Secondary | ICD-10-CM | POA: Diagnosis not present

## 2018-02-21 DIAGNOSIS — D631 Anemia in chronic kidney disease: Secondary | ICD-10-CM | POA: Diagnosis not present

## 2018-02-21 DIAGNOSIS — E7849 Other hyperlipidemia: Secondary | ICD-10-CM | POA: Diagnosis not present

## 2018-02-21 DIAGNOSIS — D509 Iron deficiency anemia, unspecified: Secondary | ICD-10-CM | POA: Diagnosis not present

## 2018-02-21 DIAGNOSIS — E119 Type 2 diabetes mellitus without complications: Secondary | ICD-10-CM | POA: Diagnosis not present

## 2018-02-21 DIAGNOSIS — N186 End stage renal disease: Secondary | ICD-10-CM | POA: Diagnosis not present

## 2018-02-22 ENCOUNTER — Encounter (INDEPENDENT_AMBULATORY_CARE_PROVIDER_SITE_OTHER): Payer: Medicare Other | Admitting: Ophthalmology

## 2018-02-22 DIAGNOSIS — H43813 Vitreous degeneration, bilateral: Secondary | ICD-10-CM

## 2018-02-22 DIAGNOSIS — Z4932 Encounter for adequacy testing for peritoneal dialysis: Secondary | ICD-10-CM | POA: Diagnosis not present

## 2018-02-22 DIAGNOSIS — E11319 Type 2 diabetes mellitus with unspecified diabetic retinopathy without macular edema: Secondary | ICD-10-CM | POA: Diagnosis not present

## 2018-02-22 DIAGNOSIS — E113391 Type 2 diabetes mellitus with moderate nonproliferative diabetic retinopathy without macular edema, right eye: Secondary | ICD-10-CM | POA: Diagnosis not present

## 2018-02-22 DIAGNOSIS — N2589 Other disorders resulting from impaired renal tubular function: Secondary | ICD-10-CM | POA: Diagnosis not present

## 2018-02-22 DIAGNOSIS — I10 Essential (primary) hypertension: Secondary | ICD-10-CM

## 2018-02-22 DIAGNOSIS — E7849 Other hyperlipidemia: Secondary | ICD-10-CM | POA: Diagnosis not present

## 2018-02-22 DIAGNOSIS — H35033 Hypertensive retinopathy, bilateral: Secondary | ICD-10-CM

## 2018-02-22 DIAGNOSIS — E113292 Type 2 diabetes mellitus with mild nonproliferative diabetic retinopathy without macular edema, left eye: Secondary | ICD-10-CM | POA: Diagnosis not present

## 2018-02-22 DIAGNOSIS — N186 End stage renal disease: Secondary | ICD-10-CM | POA: Diagnosis not present

## 2018-02-22 DIAGNOSIS — D509 Iron deficiency anemia, unspecified: Secondary | ICD-10-CM | POA: Diagnosis not present

## 2018-02-22 DIAGNOSIS — R82998 Other abnormal findings in urine: Secondary | ICD-10-CM | POA: Diagnosis not present

## 2018-02-22 DIAGNOSIS — D631 Anemia in chronic kidney disease: Secondary | ICD-10-CM | POA: Diagnosis not present

## 2018-02-22 DIAGNOSIS — N2581 Secondary hyperparathyroidism of renal origin: Secondary | ICD-10-CM | POA: Diagnosis not present

## 2018-02-22 DIAGNOSIS — E119 Type 2 diabetes mellitus without complications: Secondary | ICD-10-CM | POA: Diagnosis not present

## 2018-02-23 DIAGNOSIS — N2589 Other disorders resulting from impaired renal tubular function: Secondary | ICD-10-CM | POA: Diagnosis not present

## 2018-02-23 DIAGNOSIS — D509 Iron deficiency anemia, unspecified: Secondary | ICD-10-CM | POA: Diagnosis not present

## 2018-02-23 DIAGNOSIS — R82998 Other abnormal findings in urine: Secondary | ICD-10-CM | POA: Diagnosis not present

## 2018-02-23 DIAGNOSIS — Z4932 Encounter for adequacy testing for peritoneal dialysis: Secondary | ICD-10-CM | POA: Diagnosis not present

## 2018-02-23 DIAGNOSIS — N186 End stage renal disease: Secondary | ICD-10-CM | POA: Diagnosis not present

## 2018-02-23 DIAGNOSIS — E119 Type 2 diabetes mellitus without complications: Secondary | ICD-10-CM | POA: Diagnosis not present

## 2018-02-23 DIAGNOSIS — E7849 Other hyperlipidemia: Secondary | ICD-10-CM | POA: Diagnosis not present

## 2018-02-23 DIAGNOSIS — D631 Anemia in chronic kidney disease: Secondary | ICD-10-CM | POA: Diagnosis not present

## 2018-02-23 DIAGNOSIS — N2581 Secondary hyperparathyroidism of renal origin: Secondary | ICD-10-CM | POA: Diagnosis not present

## 2018-02-24 DIAGNOSIS — E7849 Other hyperlipidemia: Secondary | ICD-10-CM | POA: Diagnosis not present

## 2018-02-24 DIAGNOSIS — R82998 Other abnormal findings in urine: Secondary | ICD-10-CM | POA: Diagnosis not present

## 2018-02-24 DIAGNOSIS — N186 End stage renal disease: Secondary | ICD-10-CM | POA: Diagnosis not present

## 2018-02-24 DIAGNOSIS — E119 Type 2 diabetes mellitus without complications: Secondary | ICD-10-CM | POA: Diagnosis not present

## 2018-02-24 DIAGNOSIS — D509 Iron deficiency anemia, unspecified: Secondary | ICD-10-CM | POA: Diagnosis not present

## 2018-02-24 DIAGNOSIS — N2589 Other disorders resulting from impaired renal tubular function: Secondary | ICD-10-CM | POA: Diagnosis not present

## 2018-02-24 DIAGNOSIS — Z4932 Encounter for adequacy testing for peritoneal dialysis: Secondary | ICD-10-CM | POA: Diagnosis not present

## 2018-02-24 DIAGNOSIS — D631 Anemia in chronic kidney disease: Secondary | ICD-10-CM | POA: Diagnosis not present

## 2018-02-24 DIAGNOSIS — N2581 Secondary hyperparathyroidism of renal origin: Secondary | ICD-10-CM | POA: Diagnosis not present

## 2018-02-25 DIAGNOSIS — N2589 Other disorders resulting from impaired renal tubular function: Secondary | ICD-10-CM | POA: Diagnosis not present

## 2018-02-25 DIAGNOSIS — Z4932 Encounter for adequacy testing for peritoneal dialysis: Secondary | ICD-10-CM | POA: Diagnosis not present

## 2018-02-25 DIAGNOSIS — D631 Anemia in chronic kidney disease: Secondary | ICD-10-CM | POA: Diagnosis not present

## 2018-02-25 DIAGNOSIS — N186 End stage renal disease: Secondary | ICD-10-CM | POA: Diagnosis not present

## 2018-02-25 DIAGNOSIS — E119 Type 2 diabetes mellitus without complications: Secondary | ICD-10-CM | POA: Diagnosis not present

## 2018-02-25 DIAGNOSIS — N2581 Secondary hyperparathyroidism of renal origin: Secondary | ICD-10-CM | POA: Diagnosis not present

## 2018-02-25 DIAGNOSIS — D509 Iron deficiency anemia, unspecified: Secondary | ICD-10-CM | POA: Diagnosis not present

## 2018-02-25 DIAGNOSIS — R82998 Other abnormal findings in urine: Secondary | ICD-10-CM | POA: Diagnosis not present

## 2018-02-25 DIAGNOSIS — E7849 Other hyperlipidemia: Secondary | ICD-10-CM | POA: Diagnosis not present

## 2018-02-26 DIAGNOSIS — E119 Type 2 diabetes mellitus without complications: Secondary | ICD-10-CM | POA: Diagnosis not present

## 2018-02-26 DIAGNOSIS — D631 Anemia in chronic kidney disease: Secondary | ICD-10-CM | POA: Diagnosis not present

## 2018-02-26 DIAGNOSIS — Z4932 Encounter for adequacy testing for peritoneal dialysis: Secondary | ICD-10-CM | POA: Diagnosis not present

## 2018-02-26 DIAGNOSIS — N186 End stage renal disease: Secondary | ICD-10-CM | POA: Diagnosis not present

## 2018-02-26 DIAGNOSIS — N2581 Secondary hyperparathyroidism of renal origin: Secondary | ICD-10-CM | POA: Diagnosis not present

## 2018-02-26 DIAGNOSIS — N2589 Other disorders resulting from impaired renal tubular function: Secondary | ICD-10-CM | POA: Diagnosis not present

## 2018-02-26 DIAGNOSIS — D509 Iron deficiency anemia, unspecified: Secondary | ICD-10-CM | POA: Diagnosis not present

## 2018-02-26 DIAGNOSIS — R82998 Other abnormal findings in urine: Secondary | ICD-10-CM | POA: Diagnosis not present

## 2018-02-26 DIAGNOSIS — E7849 Other hyperlipidemia: Secondary | ICD-10-CM | POA: Diagnosis not present

## 2018-02-27 DIAGNOSIS — Z4932 Encounter for adequacy testing for peritoneal dialysis: Secondary | ICD-10-CM | POA: Diagnosis not present

## 2018-02-27 DIAGNOSIS — D509 Iron deficiency anemia, unspecified: Secondary | ICD-10-CM | POA: Diagnosis not present

## 2018-02-27 DIAGNOSIS — D631 Anemia in chronic kidney disease: Secondary | ICD-10-CM | POA: Diagnosis not present

## 2018-02-27 DIAGNOSIS — E119 Type 2 diabetes mellitus without complications: Secondary | ICD-10-CM | POA: Diagnosis not present

## 2018-02-27 DIAGNOSIS — N2581 Secondary hyperparathyroidism of renal origin: Secondary | ICD-10-CM | POA: Diagnosis not present

## 2018-02-27 DIAGNOSIS — N186 End stage renal disease: Secondary | ICD-10-CM | POA: Diagnosis not present

## 2018-02-27 DIAGNOSIS — R82998 Other abnormal findings in urine: Secondary | ICD-10-CM | POA: Diagnosis not present

## 2018-02-27 DIAGNOSIS — E7849 Other hyperlipidemia: Secondary | ICD-10-CM | POA: Diagnosis not present

## 2018-02-27 DIAGNOSIS — N2589 Other disorders resulting from impaired renal tubular function: Secondary | ICD-10-CM | POA: Diagnosis not present

## 2018-02-28 DIAGNOSIS — D631 Anemia in chronic kidney disease: Secondary | ICD-10-CM | POA: Diagnosis not present

## 2018-02-28 DIAGNOSIS — E119 Type 2 diabetes mellitus without complications: Secondary | ICD-10-CM | POA: Diagnosis not present

## 2018-02-28 DIAGNOSIS — E7849 Other hyperlipidemia: Secondary | ICD-10-CM | POA: Diagnosis not present

## 2018-02-28 DIAGNOSIS — D509 Iron deficiency anemia, unspecified: Secondary | ICD-10-CM | POA: Diagnosis not present

## 2018-02-28 DIAGNOSIS — N186 End stage renal disease: Secondary | ICD-10-CM | POA: Diagnosis not present

## 2018-02-28 DIAGNOSIS — Z4932 Encounter for adequacy testing for peritoneal dialysis: Secondary | ICD-10-CM | POA: Diagnosis not present

## 2018-02-28 DIAGNOSIS — R82998 Other abnormal findings in urine: Secondary | ICD-10-CM | POA: Diagnosis not present

## 2018-02-28 DIAGNOSIS — N2589 Other disorders resulting from impaired renal tubular function: Secondary | ICD-10-CM | POA: Diagnosis not present

## 2018-02-28 DIAGNOSIS — N2581 Secondary hyperparathyroidism of renal origin: Secondary | ICD-10-CM | POA: Diagnosis not present

## 2018-03-01 DIAGNOSIS — D631 Anemia in chronic kidney disease: Secondary | ICD-10-CM | POA: Diagnosis not present

## 2018-03-01 DIAGNOSIS — N186 End stage renal disease: Secondary | ICD-10-CM | POA: Diagnosis not present

## 2018-03-01 DIAGNOSIS — Z4932 Encounter for adequacy testing for peritoneal dialysis: Secondary | ICD-10-CM | POA: Diagnosis not present

## 2018-03-01 DIAGNOSIS — N2581 Secondary hyperparathyroidism of renal origin: Secondary | ICD-10-CM | POA: Diagnosis not present

## 2018-03-01 DIAGNOSIS — E7849 Other hyperlipidemia: Secondary | ICD-10-CM | POA: Diagnosis not present

## 2018-03-01 DIAGNOSIS — E119 Type 2 diabetes mellitus without complications: Secondary | ICD-10-CM | POA: Diagnosis not present

## 2018-03-01 DIAGNOSIS — N2589 Other disorders resulting from impaired renal tubular function: Secondary | ICD-10-CM | POA: Diagnosis not present

## 2018-03-01 DIAGNOSIS — R82998 Other abnormal findings in urine: Secondary | ICD-10-CM | POA: Diagnosis not present

## 2018-03-01 DIAGNOSIS — D509 Iron deficiency anemia, unspecified: Secondary | ICD-10-CM | POA: Diagnosis not present

## 2018-03-02 ENCOUNTER — Other Ambulatory Visit: Payer: Self-pay | Admitting: Adult Health

## 2018-03-02 ENCOUNTER — Other Ambulatory Visit: Payer: Self-pay | Admitting: Cardiology

## 2018-03-02 DIAGNOSIS — E7849 Other hyperlipidemia: Secondary | ICD-10-CM | POA: Diagnosis not present

## 2018-03-02 DIAGNOSIS — N2589 Other disorders resulting from impaired renal tubular function: Secondary | ICD-10-CM | POA: Diagnosis not present

## 2018-03-02 DIAGNOSIS — E119 Type 2 diabetes mellitus without complications: Secondary | ICD-10-CM | POA: Diagnosis not present

## 2018-03-02 DIAGNOSIS — D509 Iron deficiency anemia, unspecified: Secondary | ICD-10-CM | POA: Diagnosis not present

## 2018-03-02 DIAGNOSIS — D631 Anemia in chronic kidney disease: Secondary | ICD-10-CM | POA: Diagnosis not present

## 2018-03-02 DIAGNOSIS — R82998 Other abnormal findings in urine: Secondary | ICD-10-CM | POA: Diagnosis not present

## 2018-03-02 DIAGNOSIS — N186 End stage renal disease: Secondary | ICD-10-CM | POA: Diagnosis not present

## 2018-03-02 DIAGNOSIS — Z4932 Encounter for adequacy testing for peritoneal dialysis: Secondary | ICD-10-CM | POA: Diagnosis not present

## 2018-03-02 DIAGNOSIS — N2581 Secondary hyperparathyroidism of renal origin: Secondary | ICD-10-CM | POA: Diagnosis not present

## 2018-03-03 DIAGNOSIS — E7849 Other hyperlipidemia: Secondary | ICD-10-CM | POA: Diagnosis not present

## 2018-03-03 DIAGNOSIS — N2581 Secondary hyperparathyroidism of renal origin: Secondary | ICD-10-CM | POA: Diagnosis not present

## 2018-03-03 DIAGNOSIS — E119 Type 2 diabetes mellitus without complications: Secondary | ICD-10-CM | POA: Diagnosis not present

## 2018-03-03 DIAGNOSIS — N2589 Other disorders resulting from impaired renal tubular function: Secondary | ICD-10-CM | POA: Diagnosis not present

## 2018-03-03 DIAGNOSIS — R82998 Other abnormal findings in urine: Secondary | ICD-10-CM | POA: Diagnosis not present

## 2018-03-03 DIAGNOSIS — D509 Iron deficiency anemia, unspecified: Secondary | ICD-10-CM | POA: Diagnosis not present

## 2018-03-03 DIAGNOSIS — N186 End stage renal disease: Secondary | ICD-10-CM | POA: Diagnosis not present

## 2018-03-03 DIAGNOSIS — Z4932 Encounter for adequacy testing for peritoneal dialysis: Secondary | ICD-10-CM | POA: Diagnosis not present

## 2018-03-03 DIAGNOSIS — D631 Anemia in chronic kidney disease: Secondary | ICD-10-CM | POA: Diagnosis not present

## 2018-03-04 DIAGNOSIS — D631 Anemia in chronic kidney disease: Secondary | ICD-10-CM | POA: Diagnosis not present

## 2018-03-04 DIAGNOSIS — E119 Type 2 diabetes mellitus without complications: Secondary | ICD-10-CM | POA: Diagnosis not present

## 2018-03-04 DIAGNOSIS — E7849 Other hyperlipidemia: Secondary | ICD-10-CM | POA: Diagnosis not present

## 2018-03-04 DIAGNOSIS — R82998 Other abnormal findings in urine: Secondary | ICD-10-CM | POA: Diagnosis not present

## 2018-03-04 DIAGNOSIS — N186 End stage renal disease: Secondary | ICD-10-CM | POA: Diagnosis not present

## 2018-03-04 DIAGNOSIS — Z4932 Encounter for adequacy testing for peritoneal dialysis: Secondary | ICD-10-CM | POA: Diagnosis not present

## 2018-03-04 DIAGNOSIS — D509 Iron deficiency anemia, unspecified: Secondary | ICD-10-CM | POA: Diagnosis not present

## 2018-03-04 DIAGNOSIS — N2589 Other disorders resulting from impaired renal tubular function: Secondary | ICD-10-CM | POA: Diagnosis not present

## 2018-03-04 DIAGNOSIS — N2581 Secondary hyperparathyroidism of renal origin: Secondary | ICD-10-CM | POA: Diagnosis not present

## 2018-03-04 NOTE — Telephone Encounter (Signed)
Rx has been sent to the pharmacy electronically. ° °

## 2018-03-05 DIAGNOSIS — N186 End stage renal disease: Secondary | ICD-10-CM | POA: Diagnosis not present

## 2018-03-05 DIAGNOSIS — E7849 Other hyperlipidemia: Secondary | ICD-10-CM | POA: Diagnosis not present

## 2018-03-05 DIAGNOSIS — N2589 Other disorders resulting from impaired renal tubular function: Secondary | ICD-10-CM | POA: Diagnosis not present

## 2018-03-05 DIAGNOSIS — R82998 Other abnormal findings in urine: Secondary | ICD-10-CM | POA: Diagnosis not present

## 2018-03-05 DIAGNOSIS — N2581 Secondary hyperparathyroidism of renal origin: Secondary | ICD-10-CM | POA: Diagnosis not present

## 2018-03-05 DIAGNOSIS — E119 Type 2 diabetes mellitus without complications: Secondary | ICD-10-CM | POA: Diagnosis not present

## 2018-03-05 DIAGNOSIS — Z4932 Encounter for adequacy testing for peritoneal dialysis: Secondary | ICD-10-CM | POA: Diagnosis not present

## 2018-03-05 DIAGNOSIS — D509 Iron deficiency anemia, unspecified: Secondary | ICD-10-CM | POA: Diagnosis not present

## 2018-03-05 DIAGNOSIS — D631 Anemia in chronic kidney disease: Secondary | ICD-10-CM | POA: Diagnosis not present

## 2018-03-06 DIAGNOSIS — E7849 Other hyperlipidemia: Secondary | ICD-10-CM | POA: Diagnosis not present

## 2018-03-06 DIAGNOSIS — D509 Iron deficiency anemia, unspecified: Secondary | ICD-10-CM | POA: Diagnosis not present

## 2018-03-06 DIAGNOSIS — N2589 Other disorders resulting from impaired renal tubular function: Secondary | ICD-10-CM | POA: Diagnosis not present

## 2018-03-06 DIAGNOSIS — N2581 Secondary hyperparathyroidism of renal origin: Secondary | ICD-10-CM | POA: Diagnosis not present

## 2018-03-06 DIAGNOSIS — N186 End stage renal disease: Secondary | ICD-10-CM | POA: Diagnosis not present

## 2018-03-06 DIAGNOSIS — Z4932 Encounter for adequacy testing for peritoneal dialysis: Secondary | ICD-10-CM | POA: Diagnosis not present

## 2018-03-06 DIAGNOSIS — R82998 Other abnormal findings in urine: Secondary | ICD-10-CM | POA: Diagnosis not present

## 2018-03-06 DIAGNOSIS — E119 Type 2 diabetes mellitus without complications: Secondary | ICD-10-CM | POA: Diagnosis not present

## 2018-03-06 DIAGNOSIS — D631 Anemia in chronic kidney disease: Secondary | ICD-10-CM | POA: Diagnosis not present

## 2018-03-07 DIAGNOSIS — R82998 Other abnormal findings in urine: Secondary | ICD-10-CM | POA: Diagnosis not present

## 2018-03-07 DIAGNOSIS — D631 Anemia in chronic kidney disease: Secondary | ICD-10-CM | POA: Diagnosis not present

## 2018-03-07 DIAGNOSIS — E119 Type 2 diabetes mellitus without complications: Secondary | ICD-10-CM | POA: Diagnosis not present

## 2018-03-07 DIAGNOSIS — D509 Iron deficiency anemia, unspecified: Secondary | ICD-10-CM | POA: Diagnosis not present

## 2018-03-07 DIAGNOSIS — N2581 Secondary hyperparathyroidism of renal origin: Secondary | ICD-10-CM | POA: Diagnosis not present

## 2018-03-07 DIAGNOSIS — N186 End stage renal disease: Secondary | ICD-10-CM | POA: Diagnosis not present

## 2018-03-07 DIAGNOSIS — E7849 Other hyperlipidemia: Secondary | ICD-10-CM | POA: Diagnosis not present

## 2018-03-07 DIAGNOSIS — Z4932 Encounter for adequacy testing for peritoneal dialysis: Secondary | ICD-10-CM | POA: Diagnosis not present

## 2018-03-07 DIAGNOSIS — N2589 Other disorders resulting from impaired renal tubular function: Secondary | ICD-10-CM | POA: Diagnosis not present

## 2018-03-08 ENCOUNTER — Ambulatory Visit
Admission: RE | Admit: 2018-03-08 | Discharge: 2018-03-08 | Disposition: A | Discharge status: Home / Self Care | Payer: Medicare Other | Source: Ambulatory Visit | Attending: Internal Medicine | Admitting: Internal Medicine

## 2018-03-08 DIAGNOSIS — D631 Anemia in chronic kidney disease: Secondary | ICD-10-CM | POA: Diagnosis not present

## 2018-03-08 DIAGNOSIS — N2581 Secondary hyperparathyroidism of renal origin: Secondary | ICD-10-CM | POA: Diagnosis not present

## 2018-03-08 DIAGNOSIS — D509 Iron deficiency anemia, unspecified: Secondary | ICD-10-CM | POA: Diagnosis not present

## 2018-03-08 DIAGNOSIS — R82998 Other abnormal findings in urine: Secondary | ICD-10-CM | POA: Diagnosis not present

## 2018-03-08 DIAGNOSIS — E119 Type 2 diabetes mellitus without complications: Secondary | ICD-10-CM | POA: Diagnosis not present

## 2018-03-08 DIAGNOSIS — N186 End stage renal disease: Secondary | ICD-10-CM | POA: Diagnosis not present

## 2018-03-08 DIAGNOSIS — Z1231 Encounter for screening mammogram for malignant neoplasm of breast: Secondary | ICD-10-CM

## 2018-03-08 DIAGNOSIS — Z4932 Encounter for adequacy testing for peritoneal dialysis: Secondary | ICD-10-CM | POA: Diagnosis not present

## 2018-03-08 DIAGNOSIS — E7849 Other hyperlipidemia: Secondary | ICD-10-CM | POA: Diagnosis not present

## 2018-03-08 DIAGNOSIS — N2589 Other disorders resulting from impaired renal tubular function: Secondary | ICD-10-CM | POA: Diagnosis not present

## 2018-03-09 DIAGNOSIS — N2581 Secondary hyperparathyroidism of renal origin: Secondary | ICD-10-CM | POA: Diagnosis not present

## 2018-03-09 DIAGNOSIS — Z4932 Encounter for adequacy testing for peritoneal dialysis: Secondary | ICD-10-CM | POA: Diagnosis not present

## 2018-03-09 DIAGNOSIS — D631 Anemia in chronic kidney disease: Secondary | ICD-10-CM | POA: Diagnosis not present

## 2018-03-09 DIAGNOSIS — N2589 Other disorders resulting from impaired renal tubular function: Secondary | ICD-10-CM | POA: Diagnosis not present

## 2018-03-09 DIAGNOSIS — R82998 Other abnormal findings in urine: Secondary | ICD-10-CM | POA: Diagnosis not present

## 2018-03-09 DIAGNOSIS — E119 Type 2 diabetes mellitus without complications: Secondary | ICD-10-CM | POA: Diagnosis not present

## 2018-03-09 DIAGNOSIS — E7849 Other hyperlipidemia: Secondary | ICD-10-CM | POA: Diagnosis not present

## 2018-03-09 DIAGNOSIS — D509 Iron deficiency anemia, unspecified: Secondary | ICD-10-CM | POA: Diagnosis not present

## 2018-03-09 DIAGNOSIS — N186 End stage renal disease: Secondary | ICD-10-CM | POA: Diagnosis not present

## 2018-03-10 DIAGNOSIS — Z4932 Encounter for adequacy testing for peritoneal dialysis: Secondary | ICD-10-CM | POA: Diagnosis not present

## 2018-03-10 DIAGNOSIS — N2589 Other disorders resulting from impaired renal tubular function: Secondary | ICD-10-CM | POA: Diagnosis not present

## 2018-03-10 DIAGNOSIS — R82998 Other abnormal findings in urine: Secondary | ICD-10-CM | POA: Diagnosis not present

## 2018-03-10 DIAGNOSIS — N186 End stage renal disease: Secondary | ICD-10-CM | POA: Diagnosis not present

## 2018-03-10 DIAGNOSIS — N2581 Secondary hyperparathyroidism of renal origin: Secondary | ICD-10-CM | POA: Diagnosis not present

## 2018-03-10 DIAGNOSIS — E119 Type 2 diabetes mellitus without complications: Secondary | ICD-10-CM | POA: Diagnosis not present

## 2018-03-10 DIAGNOSIS — D509 Iron deficiency anemia, unspecified: Secondary | ICD-10-CM | POA: Diagnosis not present

## 2018-03-10 DIAGNOSIS — E7849 Other hyperlipidemia: Secondary | ICD-10-CM | POA: Diagnosis not present

## 2018-03-10 DIAGNOSIS — D631 Anemia in chronic kidney disease: Secondary | ICD-10-CM | POA: Diagnosis not present

## 2018-03-11 DIAGNOSIS — R82998 Other abnormal findings in urine: Secondary | ICD-10-CM | POA: Diagnosis not present

## 2018-03-11 DIAGNOSIS — N2581 Secondary hyperparathyroidism of renal origin: Secondary | ICD-10-CM | POA: Diagnosis not present

## 2018-03-11 DIAGNOSIS — N186 End stage renal disease: Secondary | ICD-10-CM | POA: Diagnosis not present

## 2018-03-11 DIAGNOSIS — E7849 Other hyperlipidemia: Secondary | ICD-10-CM | POA: Diagnosis not present

## 2018-03-11 DIAGNOSIS — D509 Iron deficiency anemia, unspecified: Secondary | ICD-10-CM | POA: Diagnosis not present

## 2018-03-11 DIAGNOSIS — D631 Anemia in chronic kidney disease: Secondary | ICD-10-CM | POA: Diagnosis not present

## 2018-03-11 DIAGNOSIS — Z4932 Encounter for adequacy testing for peritoneal dialysis: Secondary | ICD-10-CM | POA: Diagnosis not present

## 2018-03-11 DIAGNOSIS — E119 Type 2 diabetes mellitus without complications: Secondary | ICD-10-CM | POA: Diagnosis not present

## 2018-03-11 DIAGNOSIS — N2589 Other disorders resulting from impaired renal tubular function: Secondary | ICD-10-CM | POA: Diagnosis not present

## 2018-03-12 DIAGNOSIS — D631 Anemia in chronic kidney disease: Secondary | ICD-10-CM | POA: Diagnosis not present

## 2018-03-12 DIAGNOSIS — N2581 Secondary hyperparathyroidism of renal origin: Secondary | ICD-10-CM | POA: Diagnosis not present

## 2018-03-12 DIAGNOSIS — N2589 Other disorders resulting from impaired renal tubular function: Secondary | ICD-10-CM | POA: Diagnosis not present

## 2018-03-12 DIAGNOSIS — Z4932 Encounter for adequacy testing for peritoneal dialysis: Secondary | ICD-10-CM | POA: Diagnosis not present

## 2018-03-12 DIAGNOSIS — R82998 Other abnormal findings in urine: Secondary | ICD-10-CM | POA: Diagnosis not present

## 2018-03-12 DIAGNOSIS — E119 Type 2 diabetes mellitus without complications: Secondary | ICD-10-CM | POA: Diagnosis not present

## 2018-03-12 DIAGNOSIS — D509 Iron deficiency anemia, unspecified: Secondary | ICD-10-CM | POA: Diagnosis not present

## 2018-03-12 DIAGNOSIS — E7849 Other hyperlipidemia: Secondary | ICD-10-CM | POA: Diagnosis not present

## 2018-03-12 DIAGNOSIS — N186 End stage renal disease: Secondary | ICD-10-CM | POA: Diagnosis not present

## 2018-03-13 DIAGNOSIS — D631 Anemia in chronic kidney disease: Secondary | ICD-10-CM | POA: Diagnosis not present

## 2018-03-13 DIAGNOSIS — N186 End stage renal disease: Secondary | ICD-10-CM | POA: Diagnosis not present

## 2018-03-13 DIAGNOSIS — N2581 Secondary hyperparathyroidism of renal origin: Secondary | ICD-10-CM | POA: Diagnosis not present

## 2018-03-13 DIAGNOSIS — Z4932 Encounter for adequacy testing for peritoneal dialysis: Secondary | ICD-10-CM | POA: Diagnosis not present

## 2018-03-13 DIAGNOSIS — E7849 Other hyperlipidemia: Secondary | ICD-10-CM | POA: Diagnosis not present

## 2018-03-13 DIAGNOSIS — R82998 Other abnormal findings in urine: Secondary | ICD-10-CM | POA: Diagnosis not present

## 2018-03-13 DIAGNOSIS — N2589 Other disorders resulting from impaired renal tubular function: Secondary | ICD-10-CM | POA: Diagnosis not present

## 2018-03-13 DIAGNOSIS — E119 Type 2 diabetes mellitus without complications: Secondary | ICD-10-CM | POA: Diagnosis not present

## 2018-03-13 DIAGNOSIS — D509 Iron deficiency anemia, unspecified: Secondary | ICD-10-CM | POA: Diagnosis not present

## 2018-03-14 DIAGNOSIS — E119 Type 2 diabetes mellitus without complications: Secondary | ICD-10-CM | POA: Diagnosis not present

## 2018-03-14 DIAGNOSIS — R82998 Other abnormal findings in urine: Secondary | ICD-10-CM | POA: Diagnosis not present

## 2018-03-14 DIAGNOSIS — N2589 Other disorders resulting from impaired renal tubular function: Secondary | ICD-10-CM | POA: Diagnosis not present

## 2018-03-14 DIAGNOSIS — Z4932 Encounter for adequacy testing for peritoneal dialysis: Secondary | ICD-10-CM | POA: Diagnosis not present

## 2018-03-14 DIAGNOSIS — N2581 Secondary hyperparathyroidism of renal origin: Secondary | ICD-10-CM | POA: Diagnosis not present

## 2018-03-14 DIAGNOSIS — N186 End stage renal disease: Secondary | ICD-10-CM | POA: Diagnosis not present

## 2018-03-14 DIAGNOSIS — D631 Anemia in chronic kidney disease: Secondary | ICD-10-CM | POA: Diagnosis not present

## 2018-03-14 DIAGNOSIS — D509 Iron deficiency anemia, unspecified: Secondary | ICD-10-CM | POA: Diagnosis not present

## 2018-03-14 DIAGNOSIS — E7849 Other hyperlipidemia: Secondary | ICD-10-CM | POA: Diagnosis not present

## 2018-03-15 DIAGNOSIS — Z4932 Encounter for adequacy testing for peritoneal dialysis: Secondary | ICD-10-CM | POA: Diagnosis not present

## 2018-03-15 DIAGNOSIS — R82998 Other abnormal findings in urine: Secondary | ICD-10-CM | POA: Diagnosis not present

## 2018-03-15 DIAGNOSIS — N186 End stage renal disease: Secondary | ICD-10-CM | POA: Diagnosis not present

## 2018-03-15 DIAGNOSIS — D631 Anemia in chronic kidney disease: Secondary | ICD-10-CM | POA: Diagnosis not present

## 2018-03-15 DIAGNOSIS — E119 Type 2 diabetes mellitus without complications: Secondary | ICD-10-CM | POA: Diagnosis not present

## 2018-03-15 DIAGNOSIS — N2589 Other disorders resulting from impaired renal tubular function: Secondary | ICD-10-CM | POA: Diagnosis not present

## 2018-03-15 DIAGNOSIS — D509 Iron deficiency anemia, unspecified: Secondary | ICD-10-CM | POA: Diagnosis not present

## 2018-03-15 DIAGNOSIS — N2581 Secondary hyperparathyroidism of renal origin: Secondary | ICD-10-CM | POA: Diagnosis not present

## 2018-03-15 DIAGNOSIS — E7849 Other hyperlipidemia: Secondary | ICD-10-CM | POA: Diagnosis not present

## 2018-03-16 DIAGNOSIS — N2581 Secondary hyperparathyroidism of renal origin: Secondary | ICD-10-CM | POA: Diagnosis not present

## 2018-03-16 DIAGNOSIS — E7849 Other hyperlipidemia: Secondary | ICD-10-CM | POA: Diagnosis not present

## 2018-03-16 DIAGNOSIS — E119 Type 2 diabetes mellitus without complications: Secondary | ICD-10-CM | POA: Diagnosis not present

## 2018-03-16 DIAGNOSIS — N186 End stage renal disease: Secondary | ICD-10-CM | POA: Diagnosis not present

## 2018-03-16 DIAGNOSIS — N2589 Other disorders resulting from impaired renal tubular function: Secondary | ICD-10-CM | POA: Diagnosis not present

## 2018-03-16 DIAGNOSIS — D631 Anemia in chronic kidney disease: Secondary | ICD-10-CM | POA: Diagnosis not present

## 2018-03-16 DIAGNOSIS — Z4932 Encounter for adequacy testing for peritoneal dialysis: Secondary | ICD-10-CM | POA: Diagnosis not present

## 2018-03-16 DIAGNOSIS — D509 Iron deficiency anemia, unspecified: Secondary | ICD-10-CM | POA: Diagnosis not present

## 2018-03-16 DIAGNOSIS — R82998 Other abnormal findings in urine: Secondary | ICD-10-CM | POA: Diagnosis not present

## 2018-03-17 DIAGNOSIS — R82998 Other abnormal findings in urine: Secondary | ICD-10-CM | POA: Diagnosis not present

## 2018-03-17 DIAGNOSIS — D509 Iron deficiency anemia, unspecified: Secondary | ICD-10-CM | POA: Diagnosis not present

## 2018-03-17 DIAGNOSIS — N2589 Other disorders resulting from impaired renal tubular function: Secondary | ICD-10-CM | POA: Diagnosis not present

## 2018-03-17 DIAGNOSIS — N2581 Secondary hyperparathyroidism of renal origin: Secondary | ICD-10-CM | POA: Diagnosis not present

## 2018-03-17 DIAGNOSIS — N186 End stage renal disease: Secondary | ICD-10-CM | POA: Diagnosis not present

## 2018-03-17 DIAGNOSIS — D631 Anemia in chronic kidney disease: Secondary | ICD-10-CM | POA: Diagnosis not present

## 2018-03-17 DIAGNOSIS — E7849 Other hyperlipidemia: Secondary | ICD-10-CM | POA: Diagnosis not present

## 2018-03-17 DIAGNOSIS — Z4932 Encounter for adequacy testing for peritoneal dialysis: Secondary | ICD-10-CM | POA: Diagnosis not present

## 2018-03-17 DIAGNOSIS — E119 Type 2 diabetes mellitus without complications: Secondary | ICD-10-CM | POA: Diagnosis not present

## 2018-03-18 DIAGNOSIS — Z4932 Encounter for adequacy testing for peritoneal dialysis: Secondary | ICD-10-CM | POA: Diagnosis not present

## 2018-03-18 DIAGNOSIS — E119 Type 2 diabetes mellitus without complications: Secondary | ICD-10-CM | POA: Diagnosis not present

## 2018-03-18 DIAGNOSIS — D509 Iron deficiency anemia, unspecified: Secondary | ICD-10-CM | POA: Diagnosis not present

## 2018-03-18 DIAGNOSIS — E7849 Other hyperlipidemia: Secondary | ICD-10-CM | POA: Diagnosis not present

## 2018-03-18 DIAGNOSIS — R82998 Other abnormal findings in urine: Secondary | ICD-10-CM | POA: Diagnosis not present

## 2018-03-18 DIAGNOSIS — N186 End stage renal disease: Secondary | ICD-10-CM | POA: Diagnosis not present

## 2018-03-18 DIAGNOSIS — N2589 Other disorders resulting from impaired renal tubular function: Secondary | ICD-10-CM | POA: Diagnosis not present

## 2018-03-18 DIAGNOSIS — D631 Anemia in chronic kidney disease: Secondary | ICD-10-CM | POA: Diagnosis not present

## 2018-03-18 DIAGNOSIS — N2581 Secondary hyperparathyroidism of renal origin: Secondary | ICD-10-CM | POA: Diagnosis not present

## 2018-03-19 DIAGNOSIS — R82998 Other abnormal findings in urine: Secondary | ICD-10-CM | POA: Diagnosis not present

## 2018-03-19 DIAGNOSIS — D509 Iron deficiency anemia, unspecified: Secondary | ICD-10-CM | POA: Diagnosis not present

## 2018-03-19 DIAGNOSIS — E7849 Other hyperlipidemia: Secondary | ICD-10-CM | POA: Diagnosis not present

## 2018-03-19 DIAGNOSIS — N186 End stage renal disease: Secondary | ICD-10-CM | POA: Diagnosis not present

## 2018-03-19 DIAGNOSIS — N2581 Secondary hyperparathyroidism of renal origin: Secondary | ICD-10-CM | POA: Diagnosis not present

## 2018-03-19 DIAGNOSIS — E119 Type 2 diabetes mellitus without complications: Secondary | ICD-10-CM | POA: Diagnosis not present

## 2018-03-19 DIAGNOSIS — D631 Anemia in chronic kidney disease: Secondary | ICD-10-CM | POA: Diagnosis not present

## 2018-03-19 DIAGNOSIS — N2589 Other disorders resulting from impaired renal tubular function: Secondary | ICD-10-CM | POA: Diagnosis not present

## 2018-03-19 DIAGNOSIS — Z4932 Encounter for adequacy testing for peritoneal dialysis: Secondary | ICD-10-CM | POA: Diagnosis not present

## 2018-03-20 DIAGNOSIS — N2581 Secondary hyperparathyroidism of renal origin: Secondary | ICD-10-CM | POA: Diagnosis not present

## 2018-03-20 DIAGNOSIS — R82998 Other abnormal findings in urine: Secondary | ICD-10-CM | POA: Diagnosis not present

## 2018-03-20 DIAGNOSIS — Z79899 Other long term (current) drug therapy: Secondary | ICD-10-CM | POA: Diagnosis not present

## 2018-03-20 DIAGNOSIS — R17 Unspecified jaundice: Secondary | ICD-10-CM | POA: Diagnosis not present

## 2018-03-20 DIAGNOSIS — D631 Anemia in chronic kidney disease: Secondary | ICD-10-CM | POA: Diagnosis not present

## 2018-03-20 DIAGNOSIS — N186 End stage renal disease: Secondary | ICD-10-CM | POA: Diagnosis not present

## 2018-03-20 DIAGNOSIS — D509 Iron deficiency anemia, unspecified: Secondary | ICD-10-CM | POA: Diagnosis not present

## 2018-03-20 DIAGNOSIS — E440 Moderate protein-calorie malnutrition: Secondary | ICD-10-CM | POA: Diagnosis not present

## 2018-03-21 DIAGNOSIS — Z79899 Other long term (current) drug therapy: Secondary | ICD-10-CM | POA: Diagnosis not present

## 2018-03-21 DIAGNOSIS — R17 Unspecified jaundice: Secondary | ICD-10-CM | POA: Diagnosis not present

## 2018-03-21 DIAGNOSIS — D509 Iron deficiency anemia, unspecified: Secondary | ICD-10-CM | POA: Diagnosis not present

## 2018-03-21 DIAGNOSIS — R82998 Other abnormal findings in urine: Secondary | ICD-10-CM | POA: Diagnosis not present

## 2018-03-21 DIAGNOSIS — N186 End stage renal disease: Secondary | ICD-10-CM | POA: Diagnosis not present

## 2018-03-21 DIAGNOSIS — E440 Moderate protein-calorie malnutrition: Secondary | ICD-10-CM | POA: Diagnosis not present

## 2018-03-21 DIAGNOSIS — N2581 Secondary hyperparathyroidism of renal origin: Secondary | ICD-10-CM | POA: Diagnosis not present

## 2018-03-21 DIAGNOSIS — D631 Anemia in chronic kidney disease: Secondary | ICD-10-CM | POA: Diagnosis not present

## 2018-03-22 DIAGNOSIS — N186 End stage renal disease: Secondary | ICD-10-CM | POA: Diagnosis not present

## 2018-03-22 DIAGNOSIS — D509 Iron deficiency anemia, unspecified: Secondary | ICD-10-CM | POA: Diagnosis not present

## 2018-03-22 DIAGNOSIS — N2581 Secondary hyperparathyroidism of renal origin: Secondary | ICD-10-CM | POA: Diagnosis not present

## 2018-03-22 DIAGNOSIS — E440 Moderate protein-calorie malnutrition: Secondary | ICD-10-CM | POA: Diagnosis not present

## 2018-03-22 DIAGNOSIS — R17 Unspecified jaundice: Secondary | ICD-10-CM | POA: Diagnosis not present

## 2018-03-22 DIAGNOSIS — R82998 Other abnormal findings in urine: Secondary | ICD-10-CM | POA: Diagnosis not present

## 2018-03-22 DIAGNOSIS — Z79899 Other long term (current) drug therapy: Secondary | ICD-10-CM | POA: Diagnosis not present

## 2018-03-22 DIAGNOSIS — D631 Anemia in chronic kidney disease: Secondary | ICD-10-CM | POA: Diagnosis not present

## 2018-03-23 DIAGNOSIS — N2581 Secondary hyperparathyroidism of renal origin: Secondary | ICD-10-CM | POA: Diagnosis not present

## 2018-03-23 DIAGNOSIS — E440 Moderate protein-calorie malnutrition: Secondary | ICD-10-CM | POA: Diagnosis not present

## 2018-03-23 DIAGNOSIS — D509 Iron deficiency anemia, unspecified: Secondary | ICD-10-CM | POA: Diagnosis not present

## 2018-03-23 DIAGNOSIS — D631 Anemia in chronic kidney disease: Secondary | ICD-10-CM | POA: Diagnosis not present

## 2018-03-23 DIAGNOSIS — Z79899 Other long term (current) drug therapy: Secondary | ICD-10-CM | POA: Diagnosis not present

## 2018-03-23 DIAGNOSIS — N186 End stage renal disease: Secondary | ICD-10-CM | POA: Diagnosis not present

## 2018-03-23 DIAGNOSIS — R17 Unspecified jaundice: Secondary | ICD-10-CM | POA: Diagnosis not present

## 2018-03-23 DIAGNOSIS — R82998 Other abnormal findings in urine: Secondary | ICD-10-CM | POA: Diagnosis not present

## 2018-03-24 DIAGNOSIS — D631 Anemia in chronic kidney disease: Secondary | ICD-10-CM | POA: Diagnosis not present

## 2018-03-24 DIAGNOSIS — Z79899 Other long term (current) drug therapy: Secondary | ICD-10-CM | POA: Diagnosis not present

## 2018-03-24 DIAGNOSIS — N186 End stage renal disease: Secondary | ICD-10-CM | POA: Diagnosis not present

## 2018-03-24 DIAGNOSIS — E440 Moderate protein-calorie malnutrition: Secondary | ICD-10-CM | POA: Diagnosis not present

## 2018-03-24 DIAGNOSIS — N2581 Secondary hyperparathyroidism of renal origin: Secondary | ICD-10-CM | POA: Diagnosis not present

## 2018-03-24 DIAGNOSIS — D509 Iron deficiency anemia, unspecified: Secondary | ICD-10-CM | POA: Diagnosis not present

## 2018-03-24 DIAGNOSIS — R82998 Other abnormal findings in urine: Secondary | ICD-10-CM | POA: Diagnosis not present

## 2018-03-24 DIAGNOSIS — R17 Unspecified jaundice: Secondary | ICD-10-CM | POA: Diagnosis not present

## 2018-03-25 DIAGNOSIS — N186 End stage renal disease: Secondary | ICD-10-CM | POA: Diagnosis not present

## 2018-03-25 DIAGNOSIS — R17 Unspecified jaundice: Secondary | ICD-10-CM | POA: Diagnosis not present

## 2018-03-25 DIAGNOSIS — E440 Moderate protein-calorie malnutrition: Secondary | ICD-10-CM | POA: Diagnosis not present

## 2018-03-25 DIAGNOSIS — D631 Anemia in chronic kidney disease: Secondary | ICD-10-CM | POA: Diagnosis not present

## 2018-03-25 DIAGNOSIS — D509 Iron deficiency anemia, unspecified: Secondary | ICD-10-CM | POA: Diagnosis not present

## 2018-03-25 DIAGNOSIS — N2581 Secondary hyperparathyroidism of renal origin: Secondary | ICD-10-CM | POA: Diagnosis not present

## 2018-03-25 DIAGNOSIS — R82998 Other abnormal findings in urine: Secondary | ICD-10-CM | POA: Diagnosis not present

## 2018-03-25 DIAGNOSIS — Z79899 Other long term (current) drug therapy: Secondary | ICD-10-CM | POA: Diagnosis not present

## 2018-03-26 DIAGNOSIS — N186 End stage renal disease: Secondary | ICD-10-CM | POA: Diagnosis not present

## 2018-03-26 DIAGNOSIS — R82998 Other abnormal findings in urine: Secondary | ICD-10-CM | POA: Diagnosis not present

## 2018-03-26 DIAGNOSIS — Z79899 Other long term (current) drug therapy: Secondary | ICD-10-CM | POA: Diagnosis not present

## 2018-03-26 DIAGNOSIS — D509 Iron deficiency anemia, unspecified: Secondary | ICD-10-CM | POA: Diagnosis not present

## 2018-03-26 DIAGNOSIS — N2581 Secondary hyperparathyroidism of renal origin: Secondary | ICD-10-CM | POA: Diagnosis not present

## 2018-03-26 DIAGNOSIS — E440 Moderate protein-calorie malnutrition: Secondary | ICD-10-CM | POA: Diagnosis not present

## 2018-03-26 DIAGNOSIS — D631 Anemia in chronic kidney disease: Secondary | ICD-10-CM | POA: Diagnosis not present

## 2018-03-26 DIAGNOSIS — R17 Unspecified jaundice: Secondary | ICD-10-CM | POA: Diagnosis not present

## 2018-03-27 DIAGNOSIS — D509 Iron deficiency anemia, unspecified: Secondary | ICD-10-CM | POA: Diagnosis not present

## 2018-03-27 DIAGNOSIS — R82998 Other abnormal findings in urine: Secondary | ICD-10-CM | POA: Diagnosis not present

## 2018-03-27 DIAGNOSIS — Z79899 Other long term (current) drug therapy: Secondary | ICD-10-CM | POA: Diagnosis not present

## 2018-03-27 DIAGNOSIS — N2581 Secondary hyperparathyroidism of renal origin: Secondary | ICD-10-CM | POA: Diagnosis not present

## 2018-03-27 DIAGNOSIS — R17 Unspecified jaundice: Secondary | ICD-10-CM | POA: Diagnosis not present

## 2018-03-27 DIAGNOSIS — D631 Anemia in chronic kidney disease: Secondary | ICD-10-CM | POA: Diagnosis not present

## 2018-03-27 DIAGNOSIS — E440 Moderate protein-calorie malnutrition: Secondary | ICD-10-CM | POA: Diagnosis not present

## 2018-03-27 DIAGNOSIS — N186 End stage renal disease: Secondary | ICD-10-CM | POA: Diagnosis not present

## 2018-03-28 DIAGNOSIS — R17 Unspecified jaundice: Secondary | ICD-10-CM | POA: Diagnosis not present

## 2018-03-28 DIAGNOSIS — N186 End stage renal disease: Secondary | ICD-10-CM | POA: Diagnosis not present

## 2018-03-28 DIAGNOSIS — E440 Moderate protein-calorie malnutrition: Secondary | ICD-10-CM | POA: Diagnosis not present

## 2018-03-28 DIAGNOSIS — N2581 Secondary hyperparathyroidism of renal origin: Secondary | ICD-10-CM | POA: Diagnosis not present

## 2018-03-28 DIAGNOSIS — D631 Anemia in chronic kidney disease: Secondary | ICD-10-CM | POA: Diagnosis not present

## 2018-03-28 DIAGNOSIS — R82998 Other abnormal findings in urine: Secondary | ICD-10-CM | POA: Diagnosis not present

## 2018-03-28 DIAGNOSIS — D509 Iron deficiency anemia, unspecified: Secondary | ICD-10-CM | POA: Diagnosis not present

## 2018-03-28 DIAGNOSIS — Z79899 Other long term (current) drug therapy: Secondary | ICD-10-CM | POA: Diagnosis not present

## 2018-03-29 DIAGNOSIS — N2581 Secondary hyperparathyroidism of renal origin: Secondary | ICD-10-CM | POA: Diagnosis not present

## 2018-03-29 DIAGNOSIS — R17 Unspecified jaundice: Secondary | ICD-10-CM | POA: Diagnosis not present

## 2018-03-29 DIAGNOSIS — N186 End stage renal disease: Secondary | ICD-10-CM | POA: Diagnosis not present

## 2018-03-29 DIAGNOSIS — D509 Iron deficiency anemia, unspecified: Secondary | ICD-10-CM | POA: Diagnosis not present

## 2018-03-29 DIAGNOSIS — R82998 Other abnormal findings in urine: Secondary | ICD-10-CM | POA: Diagnosis not present

## 2018-03-29 DIAGNOSIS — D631 Anemia in chronic kidney disease: Secondary | ICD-10-CM | POA: Diagnosis not present

## 2018-03-29 DIAGNOSIS — E440 Moderate protein-calorie malnutrition: Secondary | ICD-10-CM | POA: Diagnosis not present

## 2018-03-29 DIAGNOSIS — Z79899 Other long term (current) drug therapy: Secondary | ICD-10-CM | POA: Diagnosis not present

## 2018-03-30 DIAGNOSIS — R17 Unspecified jaundice: Secondary | ICD-10-CM | POA: Diagnosis not present

## 2018-03-30 DIAGNOSIS — N186 End stage renal disease: Secondary | ICD-10-CM | POA: Diagnosis not present

## 2018-03-30 DIAGNOSIS — E440 Moderate protein-calorie malnutrition: Secondary | ICD-10-CM | POA: Diagnosis not present

## 2018-03-30 DIAGNOSIS — R82998 Other abnormal findings in urine: Secondary | ICD-10-CM | POA: Diagnosis not present

## 2018-03-30 DIAGNOSIS — N2581 Secondary hyperparathyroidism of renal origin: Secondary | ICD-10-CM | POA: Diagnosis not present

## 2018-03-30 DIAGNOSIS — Z79899 Other long term (current) drug therapy: Secondary | ICD-10-CM | POA: Diagnosis not present

## 2018-03-30 DIAGNOSIS — D631 Anemia in chronic kidney disease: Secondary | ICD-10-CM | POA: Diagnosis not present

## 2018-03-30 DIAGNOSIS — D509 Iron deficiency anemia, unspecified: Secondary | ICD-10-CM | POA: Diagnosis not present

## 2018-03-31 DIAGNOSIS — D509 Iron deficiency anemia, unspecified: Secondary | ICD-10-CM | POA: Diagnosis not present

## 2018-03-31 DIAGNOSIS — R17 Unspecified jaundice: Secondary | ICD-10-CM | POA: Diagnosis not present

## 2018-03-31 DIAGNOSIS — N186 End stage renal disease: Secondary | ICD-10-CM | POA: Diagnosis not present

## 2018-03-31 DIAGNOSIS — Z79899 Other long term (current) drug therapy: Secondary | ICD-10-CM | POA: Diagnosis not present

## 2018-03-31 DIAGNOSIS — D631 Anemia in chronic kidney disease: Secondary | ICD-10-CM | POA: Diagnosis not present

## 2018-03-31 DIAGNOSIS — N2581 Secondary hyperparathyroidism of renal origin: Secondary | ICD-10-CM | POA: Diagnosis not present

## 2018-03-31 DIAGNOSIS — E440 Moderate protein-calorie malnutrition: Secondary | ICD-10-CM | POA: Diagnosis not present

## 2018-03-31 DIAGNOSIS — R82998 Other abnormal findings in urine: Secondary | ICD-10-CM | POA: Diagnosis not present

## 2018-04-01 DIAGNOSIS — E440 Moderate protein-calorie malnutrition: Secondary | ICD-10-CM | POA: Diagnosis not present

## 2018-04-01 DIAGNOSIS — R82998 Other abnormal findings in urine: Secondary | ICD-10-CM | POA: Diagnosis not present

## 2018-04-01 DIAGNOSIS — N2581 Secondary hyperparathyroidism of renal origin: Secondary | ICD-10-CM | POA: Diagnosis not present

## 2018-04-01 DIAGNOSIS — R17 Unspecified jaundice: Secondary | ICD-10-CM | POA: Diagnosis not present

## 2018-04-01 DIAGNOSIS — D631 Anemia in chronic kidney disease: Secondary | ICD-10-CM | POA: Diagnosis not present

## 2018-04-01 DIAGNOSIS — D509 Iron deficiency anemia, unspecified: Secondary | ICD-10-CM | POA: Diagnosis not present

## 2018-04-01 DIAGNOSIS — N186 End stage renal disease: Secondary | ICD-10-CM | POA: Diagnosis not present

## 2018-04-01 DIAGNOSIS — Z79899 Other long term (current) drug therapy: Secondary | ICD-10-CM | POA: Diagnosis not present

## 2018-04-02 DIAGNOSIS — D509 Iron deficiency anemia, unspecified: Secondary | ICD-10-CM | POA: Diagnosis not present

## 2018-04-02 DIAGNOSIS — Z79899 Other long term (current) drug therapy: Secondary | ICD-10-CM | POA: Diagnosis not present

## 2018-04-02 DIAGNOSIS — D631 Anemia in chronic kidney disease: Secondary | ICD-10-CM | POA: Diagnosis not present

## 2018-04-02 DIAGNOSIS — R82998 Other abnormal findings in urine: Secondary | ICD-10-CM | POA: Diagnosis not present

## 2018-04-02 DIAGNOSIS — N2581 Secondary hyperparathyroidism of renal origin: Secondary | ICD-10-CM | POA: Diagnosis not present

## 2018-04-02 DIAGNOSIS — N186 End stage renal disease: Secondary | ICD-10-CM | POA: Diagnosis not present

## 2018-04-02 DIAGNOSIS — E440 Moderate protein-calorie malnutrition: Secondary | ICD-10-CM | POA: Diagnosis not present

## 2018-04-02 DIAGNOSIS — R17 Unspecified jaundice: Secondary | ICD-10-CM | POA: Diagnosis not present

## 2018-04-03 DIAGNOSIS — D509 Iron deficiency anemia, unspecified: Secondary | ICD-10-CM | POA: Diagnosis not present

## 2018-04-03 DIAGNOSIS — D631 Anemia in chronic kidney disease: Secondary | ICD-10-CM | POA: Diagnosis not present

## 2018-04-03 DIAGNOSIS — N2581 Secondary hyperparathyroidism of renal origin: Secondary | ICD-10-CM | POA: Diagnosis not present

## 2018-04-03 DIAGNOSIS — Z79899 Other long term (current) drug therapy: Secondary | ICD-10-CM | POA: Diagnosis not present

## 2018-04-03 DIAGNOSIS — N186 End stage renal disease: Secondary | ICD-10-CM | POA: Diagnosis not present

## 2018-04-03 DIAGNOSIS — R82998 Other abnormal findings in urine: Secondary | ICD-10-CM | POA: Diagnosis not present

## 2018-04-03 DIAGNOSIS — E440 Moderate protein-calorie malnutrition: Secondary | ICD-10-CM | POA: Diagnosis not present

## 2018-04-03 DIAGNOSIS — R17 Unspecified jaundice: Secondary | ICD-10-CM | POA: Diagnosis not present

## 2018-04-04 DIAGNOSIS — E440 Moderate protein-calorie malnutrition: Secondary | ICD-10-CM | POA: Diagnosis not present

## 2018-04-04 DIAGNOSIS — R82998 Other abnormal findings in urine: Secondary | ICD-10-CM | POA: Diagnosis not present

## 2018-04-04 DIAGNOSIS — Z79899 Other long term (current) drug therapy: Secondary | ICD-10-CM | POA: Diagnosis not present

## 2018-04-04 DIAGNOSIS — N186 End stage renal disease: Secondary | ICD-10-CM | POA: Diagnosis not present

## 2018-04-04 DIAGNOSIS — D631 Anemia in chronic kidney disease: Secondary | ICD-10-CM | POA: Diagnosis not present

## 2018-04-04 DIAGNOSIS — N2581 Secondary hyperparathyroidism of renal origin: Secondary | ICD-10-CM | POA: Diagnosis not present

## 2018-04-04 DIAGNOSIS — D509 Iron deficiency anemia, unspecified: Secondary | ICD-10-CM | POA: Diagnosis not present

## 2018-04-04 DIAGNOSIS — R17 Unspecified jaundice: Secondary | ICD-10-CM | POA: Diagnosis not present

## 2018-04-05 DIAGNOSIS — R82998 Other abnormal findings in urine: Secondary | ICD-10-CM | POA: Diagnosis not present

## 2018-04-05 DIAGNOSIS — E440 Moderate protein-calorie malnutrition: Secondary | ICD-10-CM | POA: Diagnosis not present

## 2018-04-05 DIAGNOSIS — N2581 Secondary hyperparathyroidism of renal origin: Secondary | ICD-10-CM | POA: Diagnosis not present

## 2018-04-05 DIAGNOSIS — N186 End stage renal disease: Secondary | ICD-10-CM | POA: Diagnosis not present

## 2018-04-05 DIAGNOSIS — D509 Iron deficiency anemia, unspecified: Secondary | ICD-10-CM | POA: Diagnosis not present

## 2018-04-05 DIAGNOSIS — R17 Unspecified jaundice: Secondary | ICD-10-CM | POA: Diagnosis not present

## 2018-04-05 DIAGNOSIS — D631 Anemia in chronic kidney disease: Secondary | ICD-10-CM | POA: Diagnosis not present

## 2018-04-05 DIAGNOSIS — Z79899 Other long term (current) drug therapy: Secondary | ICD-10-CM | POA: Diagnosis not present

## 2018-04-06 DIAGNOSIS — D509 Iron deficiency anemia, unspecified: Secondary | ICD-10-CM | POA: Diagnosis not present

## 2018-04-06 DIAGNOSIS — R17 Unspecified jaundice: Secondary | ICD-10-CM | POA: Diagnosis not present

## 2018-04-06 DIAGNOSIS — D631 Anemia in chronic kidney disease: Secondary | ICD-10-CM | POA: Diagnosis not present

## 2018-04-06 DIAGNOSIS — N186 End stage renal disease: Secondary | ICD-10-CM | POA: Diagnosis not present

## 2018-04-06 DIAGNOSIS — Z79899 Other long term (current) drug therapy: Secondary | ICD-10-CM | POA: Diagnosis not present

## 2018-04-06 DIAGNOSIS — N2581 Secondary hyperparathyroidism of renal origin: Secondary | ICD-10-CM | POA: Diagnosis not present

## 2018-04-06 DIAGNOSIS — R82998 Other abnormal findings in urine: Secondary | ICD-10-CM | POA: Diagnosis not present

## 2018-04-06 DIAGNOSIS — E440 Moderate protein-calorie malnutrition: Secondary | ICD-10-CM | POA: Diagnosis not present

## 2018-04-07 DIAGNOSIS — R17 Unspecified jaundice: Secondary | ICD-10-CM | POA: Diagnosis not present

## 2018-04-07 DIAGNOSIS — N186 End stage renal disease: Secondary | ICD-10-CM | POA: Diagnosis not present

## 2018-04-07 DIAGNOSIS — D631 Anemia in chronic kidney disease: Secondary | ICD-10-CM | POA: Diagnosis not present

## 2018-04-07 DIAGNOSIS — D509 Iron deficiency anemia, unspecified: Secondary | ICD-10-CM | POA: Diagnosis not present

## 2018-04-07 DIAGNOSIS — Z79899 Other long term (current) drug therapy: Secondary | ICD-10-CM | POA: Diagnosis not present

## 2018-04-07 DIAGNOSIS — N2581 Secondary hyperparathyroidism of renal origin: Secondary | ICD-10-CM | POA: Diagnosis not present

## 2018-04-07 DIAGNOSIS — R82998 Other abnormal findings in urine: Secondary | ICD-10-CM | POA: Diagnosis not present

## 2018-04-07 DIAGNOSIS — E440 Moderate protein-calorie malnutrition: Secondary | ICD-10-CM | POA: Diagnosis not present

## 2018-04-08 DIAGNOSIS — R17 Unspecified jaundice: Secondary | ICD-10-CM | POA: Diagnosis not present

## 2018-04-08 DIAGNOSIS — D509 Iron deficiency anemia, unspecified: Secondary | ICD-10-CM | POA: Diagnosis not present

## 2018-04-08 DIAGNOSIS — N2581 Secondary hyperparathyroidism of renal origin: Secondary | ICD-10-CM | POA: Diagnosis not present

## 2018-04-08 DIAGNOSIS — Z79899 Other long term (current) drug therapy: Secondary | ICD-10-CM | POA: Diagnosis not present

## 2018-04-08 DIAGNOSIS — E440 Moderate protein-calorie malnutrition: Secondary | ICD-10-CM | POA: Diagnosis not present

## 2018-04-08 DIAGNOSIS — N186 End stage renal disease: Secondary | ICD-10-CM | POA: Diagnosis not present

## 2018-04-08 DIAGNOSIS — D631 Anemia in chronic kidney disease: Secondary | ICD-10-CM | POA: Diagnosis not present

## 2018-04-08 DIAGNOSIS — R82998 Other abnormal findings in urine: Secondary | ICD-10-CM | POA: Diagnosis not present

## 2018-04-09 DIAGNOSIS — R17 Unspecified jaundice: Secondary | ICD-10-CM | POA: Diagnosis not present

## 2018-04-09 DIAGNOSIS — R82998 Other abnormal findings in urine: Secondary | ICD-10-CM | POA: Diagnosis not present

## 2018-04-09 DIAGNOSIS — D509 Iron deficiency anemia, unspecified: Secondary | ICD-10-CM | POA: Diagnosis not present

## 2018-04-09 DIAGNOSIS — Z79899 Other long term (current) drug therapy: Secondary | ICD-10-CM | POA: Diagnosis not present

## 2018-04-09 DIAGNOSIS — N2581 Secondary hyperparathyroidism of renal origin: Secondary | ICD-10-CM | POA: Diagnosis not present

## 2018-04-09 DIAGNOSIS — D631 Anemia in chronic kidney disease: Secondary | ICD-10-CM | POA: Diagnosis not present

## 2018-04-09 DIAGNOSIS — E440 Moderate protein-calorie malnutrition: Secondary | ICD-10-CM | POA: Diagnosis not present

## 2018-04-09 DIAGNOSIS — N186 End stage renal disease: Secondary | ICD-10-CM | POA: Diagnosis not present

## 2018-04-10 DIAGNOSIS — E440 Moderate protein-calorie malnutrition: Secondary | ICD-10-CM | POA: Diagnosis not present

## 2018-04-10 DIAGNOSIS — D631 Anemia in chronic kidney disease: Secondary | ICD-10-CM | POA: Diagnosis not present

## 2018-04-10 DIAGNOSIS — R17 Unspecified jaundice: Secondary | ICD-10-CM | POA: Diagnosis not present

## 2018-04-10 DIAGNOSIS — N186 End stage renal disease: Secondary | ICD-10-CM | POA: Diagnosis not present

## 2018-04-10 DIAGNOSIS — D509 Iron deficiency anemia, unspecified: Secondary | ICD-10-CM | POA: Diagnosis not present

## 2018-04-10 DIAGNOSIS — N2581 Secondary hyperparathyroidism of renal origin: Secondary | ICD-10-CM | POA: Diagnosis not present

## 2018-04-10 DIAGNOSIS — Z79899 Other long term (current) drug therapy: Secondary | ICD-10-CM | POA: Diagnosis not present

## 2018-04-10 DIAGNOSIS — R82998 Other abnormal findings in urine: Secondary | ICD-10-CM | POA: Diagnosis not present

## 2018-04-11 DIAGNOSIS — N2581 Secondary hyperparathyroidism of renal origin: Secondary | ICD-10-CM | POA: Diagnosis not present

## 2018-04-11 DIAGNOSIS — Z79899 Other long term (current) drug therapy: Secondary | ICD-10-CM | POA: Diagnosis not present

## 2018-04-11 DIAGNOSIS — R17 Unspecified jaundice: Secondary | ICD-10-CM | POA: Diagnosis not present

## 2018-04-11 DIAGNOSIS — N186 End stage renal disease: Secondary | ICD-10-CM | POA: Diagnosis not present

## 2018-04-11 DIAGNOSIS — R82998 Other abnormal findings in urine: Secondary | ICD-10-CM | POA: Diagnosis not present

## 2018-04-11 DIAGNOSIS — D509 Iron deficiency anemia, unspecified: Secondary | ICD-10-CM | POA: Diagnosis not present

## 2018-04-11 DIAGNOSIS — E440 Moderate protein-calorie malnutrition: Secondary | ICD-10-CM | POA: Diagnosis not present

## 2018-04-11 DIAGNOSIS — D631 Anemia in chronic kidney disease: Secondary | ICD-10-CM | POA: Diagnosis not present

## 2018-04-12 DIAGNOSIS — N186 End stage renal disease: Secondary | ICD-10-CM | POA: Diagnosis not present

## 2018-04-12 DIAGNOSIS — N2581 Secondary hyperparathyroidism of renal origin: Secondary | ICD-10-CM | POA: Diagnosis not present

## 2018-04-12 DIAGNOSIS — R82998 Other abnormal findings in urine: Secondary | ICD-10-CM | POA: Diagnosis not present

## 2018-04-12 DIAGNOSIS — E440 Moderate protein-calorie malnutrition: Secondary | ICD-10-CM | POA: Diagnosis not present

## 2018-04-12 DIAGNOSIS — D509 Iron deficiency anemia, unspecified: Secondary | ICD-10-CM | POA: Diagnosis not present

## 2018-04-12 DIAGNOSIS — D631 Anemia in chronic kidney disease: Secondary | ICD-10-CM | POA: Diagnosis not present

## 2018-04-12 DIAGNOSIS — R17 Unspecified jaundice: Secondary | ICD-10-CM | POA: Diagnosis not present

## 2018-04-12 DIAGNOSIS — Z79899 Other long term (current) drug therapy: Secondary | ICD-10-CM | POA: Diagnosis not present

## 2018-04-13 DIAGNOSIS — R17 Unspecified jaundice: Secondary | ICD-10-CM | POA: Diagnosis not present

## 2018-04-13 DIAGNOSIS — N186 End stage renal disease: Secondary | ICD-10-CM | POA: Diagnosis not present

## 2018-04-13 DIAGNOSIS — N2581 Secondary hyperparathyroidism of renal origin: Secondary | ICD-10-CM | POA: Diagnosis not present

## 2018-04-13 DIAGNOSIS — E440 Moderate protein-calorie malnutrition: Secondary | ICD-10-CM | POA: Diagnosis not present

## 2018-04-13 DIAGNOSIS — D631 Anemia in chronic kidney disease: Secondary | ICD-10-CM | POA: Diagnosis not present

## 2018-04-13 DIAGNOSIS — R82998 Other abnormal findings in urine: Secondary | ICD-10-CM | POA: Diagnosis not present

## 2018-04-13 DIAGNOSIS — Z79899 Other long term (current) drug therapy: Secondary | ICD-10-CM | POA: Diagnosis not present

## 2018-04-13 DIAGNOSIS — D509 Iron deficiency anemia, unspecified: Secondary | ICD-10-CM | POA: Diagnosis not present

## 2018-04-14 DIAGNOSIS — R17 Unspecified jaundice: Secondary | ICD-10-CM | POA: Diagnosis not present

## 2018-04-14 DIAGNOSIS — Z79899 Other long term (current) drug therapy: Secondary | ICD-10-CM | POA: Diagnosis not present

## 2018-04-14 DIAGNOSIS — E440 Moderate protein-calorie malnutrition: Secondary | ICD-10-CM | POA: Diagnosis not present

## 2018-04-14 DIAGNOSIS — N2581 Secondary hyperparathyroidism of renal origin: Secondary | ICD-10-CM | POA: Diagnosis not present

## 2018-04-14 DIAGNOSIS — N186 End stage renal disease: Secondary | ICD-10-CM | POA: Diagnosis not present

## 2018-04-14 DIAGNOSIS — D509 Iron deficiency anemia, unspecified: Secondary | ICD-10-CM | POA: Diagnosis not present

## 2018-04-14 DIAGNOSIS — D631 Anemia in chronic kidney disease: Secondary | ICD-10-CM | POA: Diagnosis not present

## 2018-04-14 DIAGNOSIS — R82998 Other abnormal findings in urine: Secondary | ICD-10-CM | POA: Diagnosis not present

## 2018-04-15 DIAGNOSIS — N186 End stage renal disease: Secondary | ICD-10-CM | POA: Diagnosis not present

## 2018-04-15 DIAGNOSIS — E440 Moderate protein-calorie malnutrition: Secondary | ICD-10-CM | POA: Diagnosis not present

## 2018-04-15 DIAGNOSIS — D509 Iron deficiency anemia, unspecified: Secondary | ICD-10-CM | POA: Diagnosis not present

## 2018-04-15 DIAGNOSIS — N2581 Secondary hyperparathyroidism of renal origin: Secondary | ICD-10-CM | POA: Diagnosis not present

## 2018-04-15 DIAGNOSIS — Z79899 Other long term (current) drug therapy: Secondary | ICD-10-CM | POA: Diagnosis not present

## 2018-04-15 DIAGNOSIS — R82998 Other abnormal findings in urine: Secondary | ICD-10-CM | POA: Diagnosis not present

## 2018-04-15 DIAGNOSIS — D631 Anemia in chronic kidney disease: Secondary | ICD-10-CM | POA: Diagnosis not present

## 2018-04-15 DIAGNOSIS — R17 Unspecified jaundice: Secondary | ICD-10-CM | POA: Diagnosis not present

## 2018-04-16 DIAGNOSIS — Z79899 Other long term (current) drug therapy: Secondary | ICD-10-CM | POA: Diagnosis not present

## 2018-04-16 DIAGNOSIS — D509 Iron deficiency anemia, unspecified: Secondary | ICD-10-CM | POA: Diagnosis not present

## 2018-04-16 DIAGNOSIS — R82998 Other abnormal findings in urine: Secondary | ICD-10-CM | POA: Diagnosis not present

## 2018-04-16 DIAGNOSIS — D631 Anemia in chronic kidney disease: Secondary | ICD-10-CM | POA: Diagnosis not present

## 2018-04-16 DIAGNOSIS — E440 Moderate protein-calorie malnutrition: Secondary | ICD-10-CM | POA: Diagnosis not present

## 2018-04-16 DIAGNOSIS — R17 Unspecified jaundice: Secondary | ICD-10-CM | POA: Diagnosis not present

## 2018-04-16 DIAGNOSIS — N2581 Secondary hyperparathyroidism of renal origin: Secondary | ICD-10-CM | POA: Diagnosis not present

## 2018-04-16 DIAGNOSIS — N186 End stage renal disease: Secondary | ICD-10-CM | POA: Diagnosis not present

## 2018-04-17 DIAGNOSIS — N186 End stage renal disease: Secondary | ICD-10-CM | POA: Diagnosis not present

## 2018-04-17 DIAGNOSIS — E440 Moderate protein-calorie malnutrition: Secondary | ICD-10-CM | POA: Diagnosis not present

## 2018-04-17 DIAGNOSIS — D509 Iron deficiency anemia, unspecified: Secondary | ICD-10-CM | POA: Diagnosis not present

## 2018-04-17 DIAGNOSIS — N2581 Secondary hyperparathyroidism of renal origin: Secondary | ICD-10-CM | POA: Diagnosis not present

## 2018-04-17 DIAGNOSIS — D631 Anemia in chronic kidney disease: Secondary | ICD-10-CM | POA: Diagnosis not present

## 2018-04-17 DIAGNOSIS — R82998 Other abnormal findings in urine: Secondary | ICD-10-CM | POA: Diagnosis not present

## 2018-04-17 DIAGNOSIS — Z79899 Other long term (current) drug therapy: Secondary | ICD-10-CM | POA: Diagnosis not present

## 2018-04-17 DIAGNOSIS — R17 Unspecified jaundice: Secondary | ICD-10-CM | POA: Diagnosis not present

## 2018-04-18 DIAGNOSIS — D509 Iron deficiency anemia, unspecified: Secondary | ICD-10-CM | POA: Diagnosis not present

## 2018-04-18 DIAGNOSIS — Z79899 Other long term (current) drug therapy: Secondary | ICD-10-CM | POA: Diagnosis not present

## 2018-04-18 DIAGNOSIS — N186 End stage renal disease: Secondary | ICD-10-CM | POA: Diagnosis not present

## 2018-04-18 DIAGNOSIS — R17 Unspecified jaundice: Secondary | ICD-10-CM | POA: Diagnosis not present

## 2018-04-18 DIAGNOSIS — N2581 Secondary hyperparathyroidism of renal origin: Secondary | ICD-10-CM | POA: Diagnosis not present

## 2018-04-18 DIAGNOSIS — E440 Moderate protein-calorie malnutrition: Secondary | ICD-10-CM | POA: Diagnosis not present

## 2018-04-18 DIAGNOSIS — D631 Anemia in chronic kidney disease: Secondary | ICD-10-CM | POA: Diagnosis not present

## 2018-04-18 DIAGNOSIS — R82998 Other abnormal findings in urine: Secondary | ICD-10-CM | POA: Diagnosis not present

## 2018-04-19 DIAGNOSIS — N2581 Secondary hyperparathyroidism of renal origin: Secondary | ICD-10-CM | POA: Diagnosis not present

## 2018-04-19 DIAGNOSIS — Z992 Dependence on renal dialysis: Secondary | ICD-10-CM | POA: Diagnosis not present

## 2018-04-19 DIAGNOSIS — E1122 Type 2 diabetes mellitus with diabetic chronic kidney disease: Secondary | ICD-10-CM | POA: Diagnosis not present

## 2018-04-19 DIAGNOSIS — D509 Iron deficiency anemia, unspecified: Secondary | ICD-10-CM | POA: Diagnosis not present

## 2018-04-19 DIAGNOSIS — D631 Anemia in chronic kidney disease: Secondary | ICD-10-CM | POA: Diagnosis not present

## 2018-04-19 DIAGNOSIS — E440 Moderate protein-calorie malnutrition: Secondary | ICD-10-CM | POA: Diagnosis not present

## 2018-04-19 DIAGNOSIS — R17 Unspecified jaundice: Secondary | ICD-10-CM | POA: Diagnosis not present

## 2018-04-19 DIAGNOSIS — Z79899 Other long term (current) drug therapy: Secondary | ICD-10-CM | POA: Diagnosis not present

## 2018-04-19 DIAGNOSIS — R82998 Other abnormal findings in urine: Secondary | ICD-10-CM | POA: Diagnosis not present

## 2018-04-19 DIAGNOSIS — N186 End stage renal disease: Secondary | ICD-10-CM | POA: Diagnosis not present

## 2018-04-20 DIAGNOSIS — N186 End stage renal disease: Secondary | ICD-10-CM | POA: Diagnosis not present

## 2018-04-20 DIAGNOSIS — E440 Moderate protein-calorie malnutrition: Secondary | ICD-10-CM | POA: Diagnosis not present

## 2018-04-20 DIAGNOSIS — Z79899 Other long term (current) drug therapy: Secondary | ICD-10-CM | POA: Diagnosis not present

## 2018-04-20 DIAGNOSIS — N2581 Secondary hyperparathyroidism of renal origin: Secondary | ICD-10-CM | POA: Diagnosis not present

## 2018-04-20 DIAGNOSIS — D509 Iron deficiency anemia, unspecified: Secondary | ICD-10-CM | POA: Diagnosis not present

## 2018-04-20 DIAGNOSIS — R17 Unspecified jaundice: Secondary | ICD-10-CM | POA: Diagnosis not present

## 2018-04-20 DIAGNOSIS — D631 Anemia in chronic kidney disease: Secondary | ICD-10-CM | POA: Diagnosis not present

## 2018-04-20 DIAGNOSIS — R82998 Other abnormal findings in urine: Secondary | ICD-10-CM | POA: Diagnosis not present

## 2018-04-21 DIAGNOSIS — N186 End stage renal disease: Secondary | ICD-10-CM | POA: Diagnosis not present

## 2018-04-21 DIAGNOSIS — D509 Iron deficiency anemia, unspecified: Secondary | ICD-10-CM | POA: Diagnosis not present

## 2018-04-21 DIAGNOSIS — R17 Unspecified jaundice: Secondary | ICD-10-CM | POA: Diagnosis not present

## 2018-04-21 DIAGNOSIS — Z79899 Other long term (current) drug therapy: Secondary | ICD-10-CM | POA: Diagnosis not present

## 2018-04-21 DIAGNOSIS — E440 Moderate protein-calorie malnutrition: Secondary | ICD-10-CM | POA: Diagnosis not present

## 2018-04-21 DIAGNOSIS — N2581 Secondary hyperparathyroidism of renal origin: Secondary | ICD-10-CM | POA: Diagnosis not present

## 2018-04-21 DIAGNOSIS — R82998 Other abnormal findings in urine: Secondary | ICD-10-CM | POA: Diagnosis not present

## 2018-04-21 DIAGNOSIS — D631 Anemia in chronic kidney disease: Secondary | ICD-10-CM | POA: Diagnosis not present

## 2018-04-22 DIAGNOSIS — D631 Anemia in chronic kidney disease: Secondary | ICD-10-CM | POA: Diagnosis not present

## 2018-04-22 DIAGNOSIS — N186 End stage renal disease: Secondary | ICD-10-CM | POA: Diagnosis not present

## 2018-04-22 DIAGNOSIS — E440 Moderate protein-calorie malnutrition: Secondary | ICD-10-CM | POA: Diagnosis not present

## 2018-04-22 DIAGNOSIS — D509 Iron deficiency anemia, unspecified: Secondary | ICD-10-CM | POA: Diagnosis not present

## 2018-04-22 DIAGNOSIS — R17 Unspecified jaundice: Secondary | ICD-10-CM | POA: Diagnosis not present

## 2018-04-22 DIAGNOSIS — R82998 Other abnormal findings in urine: Secondary | ICD-10-CM | POA: Diagnosis not present

## 2018-04-22 DIAGNOSIS — N2581 Secondary hyperparathyroidism of renal origin: Secondary | ICD-10-CM | POA: Diagnosis not present

## 2018-04-22 DIAGNOSIS — Z79899 Other long term (current) drug therapy: Secondary | ICD-10-CM | POA: Diagnosis not present

## 2018-04-23 DIAGNOSIS — N2581 Secondary hyperparathyroidism of renal origin: Secondary | ICD-10-CM | POA: Diagnosis not present

## 2018-04-23 DIAGNOSIS — R17 Unspecified jaundice: Secondary | ICD-10-CM | POA: Diagnosis not present

## 2018-04-23 DIAGNOSIS — D631 Anemia in chronic kidney disease: Secondary | ICD-10-CM | POA: Diagnosis not present

## 2018-04-23 DIAGNOSIS — D509 Iron deficiency anemia, unspecified: Secondary | ICD-10-CM | POA: Diagnosis not present

## 2018-04-23 DIAGNOSIS — Z79899 Other long term (current) drug therapy: Secondary | ICD-10-CM | POA: Diagnosis not present

## 2018-04-23 DIAGNOSIS — E440 Moderate protein-calorie malnutrition: Secondary | ICD-10-CM | POA: Diagnosis not present

## 2018-04-23 DIAGNOSIS — R82998 Other abnormal findings in urine: Secondary | ICD-10-CM | POA: Diagnosis not present

## 2018-04-23 DIAGNOSIS — N186 End stage renal disease: Secondary | ICD-10-CM | POA: Diagnosis not present

## 2018-04-24 DIAGNOSIS — R82998 Other abnormal findings in urine: Secondary | ICD-10-CM | POA: Diagnosis not present

## 2018-04-24 DIAGNOSIS — N186 End stage renal disease: Secondary | ICD-10-CM | POA: Diagnosis not present

## 2018-04-24 DIAGNOSIS — E440 Moderate protein-calorie malnutrition: Secondary | ICD-10-CM | POA: Diagnosis not present

## 2018-04-24 DIAGNOSIS — N2581 Secondary hyperparathyroidism of renal origin: Secondary | ICD-10-CM | POA: Diagnosis not present

## 2018-04-24 DIAGNOSIS — D631 Anemia in chronic kidney disease: Secondary | ICD-10-CM | POA: Diagnosis not present

## 2018-04-24 DIAGNOSIS — Z79899 Other long term (current) drug therapy: Secondary | ICD-10-CM | POA: Diagnosis not present

## 2018-04-24 DIAGNOSIS — R17 Unspecified jaundice: Secondary | ICD-10-CM | POA: Diagnosis not present

## 2018-04-24 DIAGNOSIS — D509 Iron deficiency anemia, unspecified: Secondary | ICD-10-CM | POA: Diagnosis not present

## 2018-04-25 DIAGNOSIS — N2581 Secondary hyperparathyroidism of renal origin: Secondary | ICD-10-CM | POA: Diagnosis not present

## 2018-04-25 DIAGNOSIS — R17 Unspecified jaundice: Secondary | ICD-10-CM | POA: Diagnosis not present

## 2018-04-25 DIAGNOSIS — R82998 Other abnormal findings in urine: Secondary | ICD-10-CM | POA: Diagnosis not present

## 2018-04-25 DIAGNOSIS — D631 Anemia in chronic kidney disease: Secondary | ICD-10-CM | POA: Diagnosis not present

## 2018-04-25 DIAGNOSIS — N186 End stage renal disease: Secondary | ICD-10-CM | POA: Diagnosis not present

## 2018-04-25 DIAGNOSIS — Z79899 Other long term (current) drug therapy: Secondary | ICD-10-CM | POA: Diagnosis not present

## 2018-04-25 DIAGNOSIS — E440 Moderate protein-calorie malnutrition: Secondary | ICD-10-CM | POA: Diagnosis not present

## 2018-04-25 DIAGNOSIS — D509 Iron deficiency anemia, unspecified: Secondary | ICD-10-CM | POA: Diagnosis not present

## 2018-04-26 DIAGNOSIS — Z79899 Other long term (current) drug therapy: Secondary | ICD-10-CM | POA: Diagnosis not present

## 2018-04-26 DIAGNOSIS — R82998 Other abnormal findings in urine: Secondary | ICD-10-CM | POA: Diagnosis not present

## 2018-04-26 DIAGNOSIS — E440 Moderate protein-calorie malnutrition: Secondary | ICD-10-CM | POA: Diagnosis not present

## 2018-04-26 DIAGNOSIS — N2581 Secondary hyperparathyroidism of renal origin: Secondary | ICD-10-CM | POA: Diagnosis not present

## 2018-04-26 DIAGNOSIS — N186 End stage renal disease: Secondary | ICD-10-CM | POA: Diagnosis not present

## 2018-04-26 DIAGNOSIS — D509 Iron deficiency anemia, unspecified: Secondary | ICD-10-CM | POA: Diagnosis not present

## 2018-04-26 DIAGNOSIS — R17 Unspecified jaundice: Secondary | ICD-10-CM | POA: Diagnosis not present

## 2018-04-26 DIAGNOSIS — D631 Anemia in chronic kidney disease: Secondary | ICD-10-CM | POA: Diagnosis not present

## 2018-04-27 DIAGNOSIS — R17 Unspecified jaundice: Secondary | ICD-10-CM | POA: Diagnosis not present

## 2018-04-27 DIAGNOSIS — R82998 Other abnormal findings in urine: Secondary | ICD-10-CM | POA: Diagnosis not present

## 2018-04-27 DIAGNOSIS — D509 Iron deficiency anemia, unspecified: Secondary | ICD-10-CM | POA: Diagnosis not present

## 2018-04-27 DIAGNOSIS — N2581 Secondary hyperparathyroidism of renal origin: Secondary | ICD-10-CM | POA: Diagnosis not present

## 2018-04-27 DIAGNOSIS — Z79899 Other long term (current) drug therapy: Secondary | ICD-10-CM | POA: Diagnosis not present

## 2018-04-27 DIAGNOSIS — N186 End stage renal disease: Secondary | ICD-10-CM | POA: Diagnosis not present

## 2018-04-27 DIAGNOSIS — D631 Anemia in chronic kidney disease: Secondary | ICD-10-CM | POA: Diagnosis not present

## 2018-04-27 DIAGNOSIS — E440 Moderate protein-calorie malnutrition: Secondary | ICD-10-CM | POA: Diagnosis not present

## 2018-04-28 DIAGNOSIS — N186 End stage renal disease: Secondary | ICD-10-CM | POA: Diagnosis not present

## 2018-04-28 DIAGNOSIS — R17 Unspecified jaundice: Secondary | ICD-10-CM | POA: Diagnosis not present

## 2018-04-28 DIAGNOSIS — Z79899 Other long term (current) drug therapy: Secondary | ICD-10-CM | POA: Diagnosis not present

## 2018-04-28 DIAGNOSIS — D509 Iron deficiency anemia, unspecified: Secondary | ICD-10-CM | POA: Diagnosis not present

## 2018-04-28 DIAGNOSIS — D631 Anemia in chronic kidney disease: Secondary | ICD-10-CM | POA: Diagnosis not present

## 2018-04-28 DIAGNOSIS — N2581 Secondary hyperparathyroidism of renal origin: Secondary | ICD-10-CM | POA: Diagnosis not present

## 2018-04-28 DIAGNOSIS — E440 Moderate protein-calorie malnutrition: Secondary | ICD-10-CM | POA: Diagnosis not present

## 2018-04-28 DIAGNOSIS — R82998 Other abnormal findings in urine: Secondary | ICD-10-CM | POA: Diagnosis not present

## 2018-04-29 DIAGNOSIS — R82998 Other abnormal findings in urine: Secondary | ICD-10-CM | POA: Diagnosis not present

## 2018-04-29 DIAGNOSIS — D631 Anemia in chronic kidney disease: Secondary | ICD-10-CM | POA: Diagnosis not present

## 2018-04-29 DIAGNOSIS — N186 End stage renal disease: Secondary | ICD-10-CM | POA: Diagnosis not present

## 2018-04-29 DIAGNOSIS — Z79899 Other long term (current) drug therapy: Secondary | ICD-10-CM | POA: Diagnosis not present

## 2018-04-29 DIAGNOSIS — N2581 Secondary hyperparathyroidism of renal origin: Secondary | ICD-10-CM | POA: Diagnosis not present

## 2018-04-29 DIAGNOSIS — D509 Iron deficiency anemia, unspecified: Secondary | ICD-10-CM | POA: Diagnosis not present

## 2018-04-29 DIAGNOSIS — R17 Unspecified jaundice: Secondary | ICD-10-CM | POA: Diagnosis not present

## 2018-04-29 DIAGNOSIS — E440 Moderate protein-calorie malnutrition: Secondary | ICD-10-CM | POA: Diagnosis not present

## 2018-04-30 DIAGNOSIS — D509 Iron deficiency anemia, unspecified: Secondary | ICD-10-CM | POA: Diagnosis not present

## 2018-04-30 DIAGNOSIS — N2581 Secondary hyperparathyroidism of renal origin: Secondary | ICD-10-CM | POA: Diagnosis not present

## 2018-04-30 DIAGNOSIS — E440 Moderate protein-calorie malnutrition: Secondary | ICD-10-CM | POA: Diagnosis not present

## 2018-04-30 DIAGNOSIS — N186 End stage renal disease: Secondary | ICD-10-CM | POA: Diagnosis not present

## 2018-04-30 DIAGNOSIS — R82998 Other abnormal findings in urine: Secondary | ICD-10-CM | POA: Diagnosis not present

## 2018-04-30 DIAGNOSIS — R17 Unspecified jaundice: Secondary | ICD-10-CM | POA: Diagnosis not present

## 2018-04-30 DIAGNOSIS — Z79899 Other long term (current) drug therapy: Secondary | ICD-10-CM | POA: Diagnosis not present

## 2018-04-30 DIAGNOSIS — D631 Anemia in chronic kidney disease: Secondary | ICD-10-CM | POA: Diagnosis not present

## 2018-05-01 DIAGNOSIS — N186 End stage renal disease: Secondary | ICD-10-CM | POA: Diagnosis not present

## 2018-05-01 DIAGNOSIS — N2581 Secondary hyperparathyroidism of renal origin: Secondary | ICD-10-CM | POA: Diagnosis not present

## 2018-05-01 DIAGNOSIS — D631 Anemia in chronic kidney disease: Secondary | ICD-10-CM | POA: Diagnosis not present

## 2018-05-01 DIAGNOSIS — Z79899 Other long term (current) drug therapy: Secondary | ICD-10-CM | POA: Diagnosis not present

## 2018-05-01 DIAGNOSIS — D509 Iron deficiency anemia, unspecified: Secondary | ICD-10-CM | POA: Diagnosis not present

## 2018-05-01 DIAGNOSIS — R17 Unspecified jaundice: Secondary | ICD-10-CM | POA: Diagnosis not present

## 2018-05-01 DIAGNOSIS — R82998 Other abnormal findings in urine: Secondary | ICD-10-CM | POA: Diagnosis not present

## 2018-05-01 DIAGNOSIS — E440 Moderate protein-calorie malnutrition: Secondary | ICD-10-CM | POA: Diagnosis not present

## 2018-05-02 DIAGNOSIS — D509 Iron deficiency anemia, unspecified: Secondary | ICD-10-CM | POA: Diagnosis not present

## 2018-05-02 DIAGNOSIS — R17 Unspecified jaundice: Secondary | ICD-10-CM | POA: Diagnosis not present

## 2018-05-02 DIAGNOSIS — Z79899 Other long term (current) drug therapy: Secondary | ICD-10-CM | POA: Diagnosis not present

## 2018-05-02 DIAGNOSIS — N186 End stage renal disease: Secondary | ICD-10-CM | POA: Diagnosis not present

## 2018-05-02 DIAGNOSIS — N2581 Secondary hyperparathyroidism of renal origin: Secondary | ICD-10-CM | POA: Diagnosis not present

## 2018-05-02 DIAGNOSIS — D631 Anemia in chronic kidney disease: Secondary | ICD-10-CM | POA: Diagnosis not present

## 2018-05-02 DIAGNOSIS — E440 Moderate protein-calorie malnutrition: Secondary | ICD-10-CM | POA: Diagnosis not present

## 2018-05-02 DIAGNOSIS — R82998 Other abnormal findings in urine: Secondary | ICD-10-CM | POA: Diagnosis not present

## 2018-05-03 DIAGNOSIS — D631 Anemia in chronic kidney disease: Secondary | ICD-10-CM | POA: Diagnosis not present

## 2018-05-03 DIAGNOSIS — R82998 Other abnormal findings in urine: Secondary | ICD-10-CM | POA: Diagnosis not present

## 2018-05-03 DIAGNOSIS — N2581 Secondary hyperparathyroidism of renal origin: Secondary | ICD-10-CM | POA: Diagnosis not present

## 2018-05-03 DIAGNOSIS — E440 Moderate protein-calorie malnutrition: Secondary | ICD-10-CM | POA: Diagnosis not present

## 2018-05-03 DIAGNOSIS — Z79899 Other long term (current) drug therapy: Secondary | ICD-10-CM | POA: Diagnosis not present

## 2018-05-03 DIAGNOSIS — D509 Iron deficiency anemia, unspecified: Secondary | ICD-10-CM | POA: Diagnosis not present

## 2018-05-03 DIAGNOSIS — N186 End stage renal disease: Secondary | ICD-10-CM | POA: Diagnosis not present

## 2018-05-03 DIAGNOSIS — R17 Unspecified jaundice: Secondary | ICD-10-CM | POA: Diagnosis not present

## 2018-05-04 DIAGNOSIS — N2581 Secondary hyperparathyroidism of renal origin: Secondary | ICD-10-CM | POA: Diagnosis not present

## 2018-05-04 DIAGNOSIS — R17 Unspecified jaundice: Secondary | ICD-10-CM | POA: Diagnosis not present

## 2018-05-04 DIAGNOSIS — D509 Iron deficiency anemia, unspecified: Secondary | ICD-10-CM | POA: Diagnosis not present

## 2018-05-04 DIAGNOSIS — E440 Moderate protein-calorie malnutrition: Secondary | ICD-10-CM | POA: Diagnosis not present

## 2018-05-04 DIAGNOSIS — D631 Anemia in chronic kidney disease: Secondary | ICD-10-CM | POA: Diagnosis not present

## 2018-05-04 DIAGNOSIS — R82998 Other abnormal findings in urine: Secondary | ICD-10-CM | POA: Diagnosis not present

## 2018-05-04 DIAGNOSIS — N186 End stage renal disease: Secondary | ICD-10-CM | POA: Diagnosis not present

## 2018-05-04 DIAGNOSIS — Z79899 Other long term (current) drug therapy: Secondary | ICD-10-CM | POA: Diagnosis not present

## 2018-05-05 DIAGNOSIS — R82998 Other abnormal findings in urine: Secondary | ICD-10-CM | POA: Diagnosis not present

## 2018-05-05 DIAGNOSIS — R17 Unspecified jaundice: Secondary | ICD-10-CM | POA: Diagnosis not present

## 2018-05-05 DIAGNOSIS — N2581 Secondary hyperparathyroidism of renal origin: Secondary | ICD-10-CM | POA: Diagnosis not present

## 2018-05-05 DIAGNOSIS — D509 Iron deficiency anemia, unspecified: Secondary | ICD-10-CM | POA: Diagnosis not present

## 2018-05-05 DIAGNOSIS — N186 End stage renal disease: Secondary | ICD-10-CM | POA: Diagnosis not present

## 2018-05-05 DIAGNOSIS — E440 Moderate protein-calorie malnutrition: Secondary | ICD-10-CM | POA: Diagnosis not present

## 2018-05-05 DIAGNOSIS — Z79899 Other long term (current) drug therapy: Secondary | ICD-10-CM | POA: Diagnosis not present

## 2018-05-05 DIAGNOSIS — D631 Anemia in chronic kidney disease: Secondary | ICD-10-CM | POA: Diagnosis not present

## 2018-05-06 DIAGNOSIS — E440 Moderate protein-calorie malnutrition: Secondary | ICD-10-CM | POA: Diagnosis not present

## 2018-05-06 DIAGNOSIS — N186 End stage renal disease: Secondary | ICD-10-CM | POA: Diagnosis not present

## 2018-05-06 DIAGNOSIS — R82998 Other abnormal findings in urine: Secondary | ICD-10-CM | POA: Diagnosis not present

## 2018-05-06 DIAGNOSIS — R17 Unspecified jaundice: Secondary | ICD-10-CM | POA: Diagnosis not present

## 2018-05-06 DIAGNOSIS — N2581 Secondary hyperparathyroidism of renal origin: Secondary | ICD-10-CM | POA: Diagnosis not present

## 2018-05-06 DIAGNOSIS — Z79899 Other long term (current) drug therapy: Secondary | ICD-10-CM | POA: Diagnosis not present

## 2018-05-06 DIAGNOSIS — D509 Iron deficiency anemia, unspecified: Secondary | ICD-10-CM | POA: Diagnosis not present

## 2018-05-06 DIAGNOSIS — D631 Anemia in chronic kidney disease: Secondary | ICD-10-CM | POA: Diagnosis not present

## 2018-05-07 DIAGNOSIS — N186 End stage renal disease: Secondary | ICD-10-CM | POA: Diagnosis not present

## 2018-05-07 DIAGNOSIS — Z79899 Other long term (current) drug therapy: Secondary | ICD-10-CM | POA: Diagnosis not present

## 2018-05-07 DIAGNOSIS — D631 Anemia in chronic kidney disease: Secondary | ICD-10-CM | POA: Diagnosis not present

## 2018-05-07 DIAGNOSIS — E440 Moderate protein-calorie malnutrition: Secondary | ICD-10-CM | POA: Diagnosis not present

## 2018-05-07 DIAGNOSIS — R17 Unspecified jaundice: Secondary | ICD-10-CM | POA: Diagnosis not present

## 2018-05-07 DIAGNOSIS — D509 Iron deficiency anemia, unspecified: Secondary | ICD-10-CM | POA: Diagnosis not present

## 2018-05-07 DIAGNOSIS — N2581 Secondary hyperparathyroidism of renal origin: Secondary | ICD-10-CM | POA: Diagnosis not present

## 2018-05-07 DIAGNOSIS — R82998 Other abnormal findings in urine: Secondary | ICD-10-CM | POA: Diagnosis not present

## 2018-05-08 DIAGNOSIS — Z79899 Other long term (current) drug therapy: Secondary | ICD-10-CM | POA: Diagnosis not present

## 2018-05-08 DIAGNOSIS — N2581 Secondary hyperparathyroidism of renal origin: Secondary | ICD-10-CM | POA: Diagnosis not present

## 2018-05-08 DIAGNOSIS — D509 Iron deficiency anemia, unspecified: Secondary | ICD-10-CM | POA: Diagnosis not present

## 2018-05-08 DIAGNOSIS — E440 Moderate protein-calorie malnutrition: Secondary | ICD-10-CM | POA: Diagnosis not present

## 2018-05-08 DIAGNOSIS — N186 End stage renal disease: Secondary | ICD-10-CM | POA: Diagnosis not present

## 2018-05-08 DIAGNOSIS — R82998 Other abnormal findings in urine: Secondary | ICD-10-CM | POA: Diagnosis not present

## 2018-05-08 DIAGNOSIS — R17 Unspecified jaundice: Secondary | ICD-10-CM | POA: Diagnosis not present

## 2018-05-08 DIAGNOSIS — D631 Anemia in chronic kidney disease: Secondary | ICD-10-CM | POA: Diagnosis not present

## 2018-05-09 DIAGNOSIS — N186 End stage renal disease: Secondary | ICD-10-CM | POA: Diagnosis not present

## 2018-05-09 DIAGNOSIS — D509 Iron deficiency anemia, unspecified: Secondary | ICD-10-CM | POA: Diagnosis not present

## 2018-05-09 DIAGNOSIS — Z79899 Other long term (current) drug therapy: Secondary | ICD-10-CM | POA: Diagnosis not present

## 2018-05-09 DIAGNOSIS — R17 Unspecified jaundice: Secondary | ICD-10-CM | POA: Diagnosis not present

## 2018-05-09 DIAGNOSIS — D631 Anemia in chronic kidney disease: Secondary | ICD-10-CM | POA: Diagnosis not present

## 2018-05-09 DIAGNOSIS — R82998 Other abnormal findings in urine: Secondary | ICD-10-CM | POA: Diagnosis not present

## 2018-05-09 DIAGNOSIS — E440 Moderate protein-calorie malnutrition: Secondary | ICD-10-CM | POA: Diagnosis not present

## 2018-05-09 DIAGNOSIS — N2581 Secondary hyperparathyroidism of renal origin: Secondary | ICD-10-CM | POA: Diagnosis not present

## 2018-05-10 DIAGNOSIS — N2581 Secondary hyperparathyroidism of renal origin: Secondary | ICD-10-CM | POA: Diagnosis not present

## 2018-05-10 DIAGNOSIS — R82998 Other abnormal findings in urine: Secondary | ICD-10-CM | POA: Diagnosis not present

## 2018-05-10 DIAGNOSIS — N186 End stage renal disease: Secondary | ICD-10-CM | POA: Diagnosis not present

## 2018-05-10 DIAGNOSIS — E440 Moderate protein-calorie malnutrition: Secondary | ICD-10-CM | POA: Diagnosis not present

## 2018-05-10 DIAGNOSIS — D509 Iron deficiency anemia, unspecified: Secondary | ICD-10-CM | POA: Diagnosis not present

## 2018-05-10 DIAGNOSIS — D631 Anemia in chronic kidney disease: Secondary | ICD-10-CM | POA: Diagnosis not present

## 2018-05-10 DIAGNOSIS — Z79899 Other long term (current) drug therapy: Secondary | ICD-10-CM | POA: Diagnosis not present

## 2018-05-10 DIAGNOSIS — R17 Unspecified jaundice: Secondary | ICD-10-CM | POA: Diagnosis not present

## 2018-05-11 DIAGNOSIS — N2581 Secondary hyperparathyroidism of renal origin: Secondary | ICD-10-CM | POA: Diagnosis not present

## 2018-05-11 DIAGNOSIS — R17 Unspecified jaundice: Secondary | ICD-10-CM | POA: Diagnosis not present

## 2018-05-11 DIAGNOSIS — D631 Anemia in chronic kidney disease: Secondary | ICD-10-CM | POA: Diagnosis not present

## 2018-05-11 DIAGNOSIS — Z79899 Other long term (current) drug therapy: Secondary | ICD-10-CM | POA: Diagnosis not present

## 2018-05-11 DIAGNOSIS — R82998 Other abnormal findings in urine: Secondary | ICD-10-CM | POA: Diagnosis not present

## 2018-05-11 DIAGNOSIS — D509 Iron deficiency anemia, unspecified: Secondary | ICD-10-CM | POA: Diagnosis not present

## 2018-05-11 DIAGNOSIS — N186 End stage renal disease: Secondary | ICD-10-CM | POA: Diagnosis not present

## 2018-05-11 DIAGNOSIS — E440 Moderate protein-calorie malnutrition: Secondary | ICD-10-CM | POA: Diagnosis not present

## 2018-05-12 DIAGNOSIS — N2581 Secondary hyperparathyroidism of renal origin: Secondary | ICD-10-CM | POA: Diagnosis not present

## 2018-05-12 DIAGNOSIS — N186 End stage renal disease: Secondary | ICD-10-CM | POA: Diagnosis not present

## 2018-05-12 DIAGNOSIS — R82998 Other abnormal findings in urine: Secondary | ICD-10-CM | POA: Diagnosis not present

## 2018-05-12 DIAGNOSIS — D509 Iron deficiency anemia, unspecified: Secondary | ICD-10-CM | POA: Diagnosis not present

## 2018-05-12 DIAGNOSIS — R17 Unspecified jaundice: Secondary | ICD-10-CM | POA: Diagnosis not present

## 2018-05-12 DIAGNOSIS — E440 Moderate protein-calorie malnutrition: Secondary | ICD-10-CM | POA: Diagnosis not present

## 2018-05-12 DIAGNOSIS — D631 Anemia in chronic kidney disease: Secondary | ICD-10-CM | POA: Diagnosis not present

## 2018-05-12 DIAGNOSIS — Z79899 Other long term (current) drug therapy: Secondary | ICD-10-CM | POA: Diagnosis not present

## 2018-05-13 DIAGNOSIS — N2581 Secondary hyperparathyroidism of renal origin: Secondary | ICD-10-CM | POA: Diagnosis not present

## 2018-05-13 DIAGNOSIS — R82998 Other abnormal findings in urine: Secondary | ICD-10-CM | POA: Diagnosis not present

## 2018-05-13 DIAGNOSIS — D509 Iron deficiency anemia, unspecified: Secondary | ICD-10-CM | POA: Diagnosis not present

## 2018-05-13 DIAGNOSIS — D631 Anemia in chronic kidney disease: Secondary | ICD-10-CM | POA: Diagnosis not present

## 2018-05-13 DIAGNOSIS — N186 End stage renal disease: Secondary | ICD-10-CM | POA: Diagnosis not present

## 2018-05-13 DIAGNOSIS — Z79899 Other long term (current) drug therapy: Secondary | ICD-10-CM | POA: Diagnosis not present

## 2018-05-13 DIAGNOSIS — R17 Unspecified jaundice: Secondary | ICD-10-CM | POA: Diagnosis not present

## 2018-05-13 DIAGNOSIS — E440 Moderate protein-calorie malnutrition: Secondary | ICD-10-CM | POA: Diagnosis not present

## 2018-05-14 DIAGNOSIS — N186 End stage renal disease: Secondary | ICD-10-CM | POA: Diagnosis not present

## 2018-05-14 DIAGNOSIS — N2581 Secondary hyperparathyroidism of renal origin: Secondary | ICD-10-CM | POA: Diagnosis not present

## 2018-05-14 DIAGNOSIS — Z79899 Other long term (current) drug therapy: Secondary | ICD-10-CM | POA: Diagnosis not present

## 2018-05-14 DIAGNOSIS — D509 Iron deficiency anemia, unspecified: Secondary | ICD-10-CM | POA: Diagnosis not present

## 2018-05-14 DIAGNOSIS — D631 Anemia in chronic kidney disease: Secondary | ICD-10-CM | POA: Diagnosis not present

## 2018-05-14 DIAGNOSIS — R82998 Other abnormal findings in urine: Secondary | ICD-10-CM | POA: Diagnosis not present

## 2018-05-14 DIAGNOSIS — R17 Unspecified jaundice: Secondary | ICD-10-CM | POA: Diagnosis not present

## 2018-05-14 DIAGNOSIS — E440 Moderate protein-calorie malnutrition: Secondary | ICD-10-CM | POA: Diagnosis not present

## 2018-05-15 DIAGNOSIS — D509 Iron deficiency anemia, unspecified: Secondary | ICD-10-CM | POA: Diagnosis not present

## 2018-05-15 DIAGNOSIS — R17 Unspecified jaundice: Secondary | ICD-10-CM | POA: Diagnosis not present

## 2018-05-15 DIAGNOSIS — E440 Moderate protein-calorie malnutrition: Secondary | ICD-10-CM | POA: Diagnosis not present

## 2018-05-15 DIAGNOSIS — N186 End stage renal disease: Secondary | ICD-10-CM | POA: Diagnosis not present

## 2018-05-15 DIAGNOSIS — R82998 Other abnormal findings in urine: Secondary | ICD-10-CM | POA: Diagnosis not present

## 2018-05-15 DIAGNOSIS — N2581 Secondary hyperparathyroidism of renal origin: Secondary | ICD-10-CM | POA: Diagnosis not present

## 2018-05-15 DIAGNOSIS — D631 Anemia in chronic kidney disease: Secondary | ICD-10-CM | POA: Diagnosis not present

## 2018-05-15 DIAGNOSIS — Z79899 Other long term (current) drug therapy: Secondary | ICD-10-CM | POA: Diagnosis not present

## 2018-05-16 DIAGNOSIS — R82998 Other abnormal findings in urine: Secondary | ICD-10-CM | POA: Diagnosis not present

## 2018-05-16 DIAGNOSIS — R17 Unspecified jaundice: Secondary | ICD-10-CM | POA: Diagnosis not present

## 2018-05-16 DIAGNOSIS — N186 End stage renal disease: Secondary | ICD-10-CM | POA: Diagnosis not present

## 2018-05-16 DIAGNOSIS — N2581 Secondary hyperparathyroidism of renal origin: Secondary | ICD-10-CM | POA: Diagnosis not present

## 2018-05-16 DIAGNOSIS — D631 Anemia in chronic kidney disease: Secondary | ICD-10-CM | POA: Diagnosis not present

## 2018-05-16 DIAGNOSIS — Z79899 Other long term (current) drug therapy: Secondary | ICD-10-CM | POA: Diagnosis not present

## 2018-05-16 DIAGNOSIS — D509 Iron deficiency anemia, unspecified: Secondary | ICD-10-CM | POA: Diagnosis not present

## 2018-05-16 DIAGNOSIS — E440 Moderate protein-calorie malnutrition: Secondary | ICD-10-CM | POA: Diagnosis not present

## 2018-05-17 DIAGNOSIS — N186 End stage renal disease: Secondary | ICD-10-CM | POA: Diagnosis not present

## 2018-05-17 DIAGNOSIS — D631 Anemia in chronic kidney disease: Secondary | ICD-10-CM | POA: Diagnosis not present

## 2018-05-17 DIAGNOSIS — R82998 Other abnormal findings in urine: Secondary | ICD-10-CM | POA: Diagnosis not present

## 2018-05-17 DIAGNOSIS — Z79899 Other long term (current) drug therapy: Secondary | ICD-10-CM | POA: Diagnosis not present

## 2018-05-17 DIAGNOSIS — E440 Moderate protein-calorie malnutrition: Secondary | ICD-10-CM | POA: Diagnosis not present

## 2018-05-17 DIAGNOSIS — N2581 Secondary hyperparathyroidism of renal origin: Secondary | ICD-10-CM | POA: Diagnosis not present

## 2018-05-17 DIAGNOSIS — D509 Iron deficiency anemia, unspecified: Secondary | ICD-10-CM | POA: Diagnosis not present

## 2018-05-17 DIAGNOSIS — R17 Unspecified jaundice: Secondary | ICD-10-CM | POA: Diagnosis not present

## 2018-05-18 ENCOUNTER — Other Ambulatory Visit: Payer: Self-pay | Admitting: Cardiology

## 2018-05-18 DIAGNOSIS — N186 End stage renal disease: Secondary | ICD-10-CM | POA: Diagnosis not present

## 2018-05-18 DIAGNOSIS — R17 Unspecified jaundice: Secondary | ICD-10-CM | POA: Diagnosis not present

## 2018-05-18 DIAGNOSIS — R82998 Other abnormal findings in urine: Secondary | ICD-10-CM | POA: Diagnosis not present

## 2018-05-18 DIAGNOSIS — Z79899 Other long term (current) drug therapy: Secondary | ICD-10-CM | POA: Diagnosis not present

## 2018-05-18 DIAGNOSIS — D631 Anemia in chronic kidney disease: Secondary | ICD-10-CM | POA: Diagnosis not present

## 2018-05-18 DIAGNOSIS — D509 Iron deficiency anemia, unspecified: Secondary | ICD-10-CM | POA: Diagnosis not present

## 2018-05-18 DIAGNOSIS — E440 Moderate protein-calorie malnutrition: Secondary | ICD-10-CM | POA: Diagnosis not present

## 2018-05-18 DIAGNOSIS — N2581 Secondary hyperparathyroidism of renal origin: Secondary | ICD-10-CM | POA: Diagnosis not present

## 2018-05-19 DIAGNOSIS — E440 Moderate protein-calorie malnutrition: Secondary | ICD-10-CM | POA: Diagnosis not present

## 2018-05-19 DIAGNOSIS — N186 End stage renal disease: Secondary | ICD-10-CM | POA: Diagnosis not present

## 2018-05-19 DIAGNOSIS — N2581 Secondary hyperparathyroidism of renal origin: Secondary | ICD-10-CM | POA: Diagnosis not present

## 2018-05-19 DIAGNOSIS — E1122 Type 2 diabetes mellitus with diabetic chronic kidney disease: Secondary | ICD-10-CM | POA: Diagnosis not present

## 2018-05-19 DIAGNOSIS — Z992 Dependence on renal dialysis: Secondary | ICD-10-CM | POA: Diagnosis not present

## 2018-05-19 DIAGNOSIS — Z79899 Other long term (current) drug therapy: Secondary | ICD-10-CM | POA: Diagnosis not present

## 2018-05-19 DIAGNOSIS — R17 Unspecified jaundice: Secondary | ICD-10-CM | POA: Diagnosis not present

## 2018-05-19 DIAGNOSIS — D631 Anemia in chronic kidney disease: Secondary | ICD-10-CM | POA: Diagnosis not present

## 2018-05-19 DIAGNOSIS — R82998 Other abnormal findings in urine: Secondary | ICD-10-CM | POA: Diagnosis not present

## 2018-05-19 DIAGNOSIS — D509 Iron deficiency anemia, unspecified: Secondary | ICD-10-CM | POA: Diagnosis not present

## 2018-05-20 DIAGNOSIS — D509 Iron deficiency anemia, unspecified: Secondary | ICD-10-CM | POA: Diagnosis not present

## 2018-05-20 DIAGNOSIS — N2589 Other disorders resulting from impaired renal tubular function: Secondary | ICD-10-CM | POA: Diagnosis not present

## 2018-05-20 DIAGNOSIS — N186 End stage renal disease: Secondary | ICD-10-CM | POA: Diagnosis not present

## 2018-05-20 DIAGNOSIS — N2581 Secondary hyperparathyroidism of renal origin: Secondary | ICD-10-CM | POA: Diagnosis not present

## 2018-05-20 DIAGNOSIS — Z4932 Encounter for adequacy testing for peritoneal dialysis: Secondary | ICD-10-CM | POA: Diagnosis not present

## 2018-05-20 DIAGNOSIS — E440 Moderate protein-calorie malnutrition: Secondary | ICD-10-CM | POA: Diagnosis not present

## 2018-05-20 DIAGNOSIS — R82998 Other abnormal findings in urine: Secondary | ICD-10-CM | POA: Diagnosis not present

## 2018-05-20 DIAGNOSIS — E119 Type 2 diabetes mellitus without complications: Secondary | ICD-10-CM | POA: Diagnosis not present

## 2018-05-20 DIAGNOSIS — E7849 Other hyperlipidemia: Secondary | ICD-10-CM | POA: Diagnosis not present

## 2018-05-20 DIAGNOSIS — D631 Anemia in chronic kidney disease: Secondary | ICD-10-CM | POA: Diagnosis not present

## 2018-05-21 DIAGNOSIS — D631 Anemia in chronic kidney disease: Secondary | ICD-10-CM | POA: Diagnosis not present

## 2018-05-21 DIAGNOSIS — E119 Type 2 diabetes mellitus without complications: Secondary | ICD-10-CM | POA: Diagnosis not present

## 2018-05-21 DIAGNOSIS — Z4932 Encounter for adequacy testing for peritoneal dialysis: Secondary | ICD-10-CM | POA: Diagnosis not present

## 2018-05-21 DIAGNOSIS — E7849 Other hyperlipidemia: Secondary | ICD-10-CM | POA: Diagnosis not present

## 2018-05-21 DIAGNOSIS — R82998 Other abnormal findings in urine: Secondary | ICD-10-CM | POA: Diagnosis not present

## 2018-05-21 DIAGNOSIS — N2581 Secondary hyperparathyroidism of renal origin: Secondary | ICD-10-CM | POA: Diagnosis not present

## 2018-05-21 DIAGNOSIS — E440 Moderate protein-calorie malnutrition: Secondary | ICD-10-CM | POA: Diagnosis not present

## 2018-05-21 DIAGNOSIS — N186 End stage renal disease: Secondary | ICD-10-CM | POA: Diagnosis not present

## 2018-05-21 DIAGNOSIS — N2589 Other disorders resulting from impaired renal tubular function: Secondary | ICD-10-CM | POA: Diagnosis not present

## 2018-05-21 DIAGNOSIS — D509 Iron deficiency anemia, unspecified: Secondary | ICD-10-CM | POA: Diagnosis not present

## 2018-05-22 DIAGNOSIS — N2589 Other disorders resulting from impaired renal tubular function: Secondary | ICD-10-CM | POA: Diagnosis not present

## 2018-05-22 DIAGNOSIS — N2581 Secondary hyperparathyroidism of renal origin: Secondary | ICD-10-CM | POA: Diagnosis not present

## 2018-05-22 DIAGNOSIS — E119 Type 2 diabetes mellitus without complications: Secondary | ICD-10-CM | POA: Diagnosis not present

## 2018-05-22 DIAGNOSIS — E7849 Other hyperlipidemia: Secondary | ICD-10-CM | POA: Diagnosis not present

## 2018-05-22 DIAGNOSIS — Z4932 Encounter for adequacy testing for peritoneal dialysis: Secondary | ICD-10-CM | POA: Diagnosis not present

## 2018-05-22 DIAGNOSIS — D509 Iron deficiency anemia, unspecified: Secondary | ICD-10-CM | POA: Diagnosis not present

## 2018-05-22 DIAGNOSIS — D631 Anemia in chronic kidney disease: Secondary | ICD-10-CM | POA: Diagnosis not present

## 2018-05-22 DIAGNOSIS — R82998 Other abnormal findings in urine: Secondary | ICD-10-CM | POA: Diagnosis not present

## 2018-05-22 DIAGNOSIS — E440 Moderate protein-calorie malnutrition: Secondary | ICD-10-CM | POA: Diagnosis not present

## 2018-05-22 DIAGNOSIS — N186 End stage renal disease: Secondary | ICD-10-CM | POA: Diagnosis not present

## 2018-05-23 DIAGNOSIS — N186 End stage renal disease: Secondary | ICD-10-CM | POA: Diagnosis not present

## 2018-05-23 DIAGNOSIS — E7849 Other hyperlipidemia: Secondary | ICD-10-CM | POA: Diagnosis not present

## 2018-05-23 DIAGNOSIS — D509 Iron deficiency anemia, unspecified: Secondary | ICD-10-CM | POA: Diagnosis not present

## 2018-05-23 DIAGNOSIS — Z4932 Encounter for adequacy testing for peritoneal dialysis: Secondary | ICD-10-CM | POA: Diagnosis not present

## 2018-05-23 DIAGNOSIS — E119 Type 2 diabetes mellitus without complications: Secondary | ICD-10-CM | POA: Diagnosis not present

## 2018-05-23 DIAGNOSIS — E440 Moderate protein-calorie malnutrition: Secondary | ICD-10-CM | POA: Diagnosis not present

## 2018-05-23 DIAGNOSIS — R82998 Other abnormal findings in urine: Secondary | ICD-10-CM | POA: Diagnosis not present

## 2018-05-23 DIAGNOSIS — N2581 Secondary hyperparathyroidism of renal origin: Secondary | ICD-10-CM | POA: Diagnosis not present

## 2018-05-23 DIAGNOSIS — D631 Anemia in chronic kidney disease: Secondary | ICD-10-CM | POA: Diagnosis not present

## 2018-05-23 DIAGNOSIS — N2589 Other disorders resulting from impaired renal tubular function: Secondary | ICD-10-CM | POA: Diagnosis not present

## 2018-05-24 DIAGNOSIS — N2589 Other disorders resulting from impaired renal tubular function: Secondary | ICD-10-CM | POA: Diagnosis not present

## 2018-05-24 DIAGNOSIS — D509 Iron deficiency anemia, unspecified: Secondary | ICD-10-CM | POA: Diagnosis not present

## 2018-05-24 DIAGNOSIS — N186 End stage renal disease: Secondary | ICD-10-CM | POA: Diagnosis not present

## 2018-05-24 DIAGNOSIS — R82998 Other abnormal findings in urine: Secondary | ICD-10-CM | POA: Diagnosis not present

## 2018-05-24 DIAGNOSIS — Z4932 Encounter for adequacy testing for peritoneal dialysis: Secondary | ICD-10-CM | POA: Diagnosis not present

## 2018-05-24 DIAGNOSIS — D631 Anemia in chronic kidney disease: Secondary | ICD-10-CM | POA: Diagnosis not present

## 2018-05-24 DIAGNOSIS — N2581 Secondary hyperparathyroidism of renal origin: Secondary | ICD-10-CM | POA: Diagnosis not present

## 2018-05-24 DIAGNOSIS — E119 Type 2 diabetes mellitus without complications: Secondary | ICD-10-CM | POA: Diagnosis not present

## 2018-05-24 DIAGNOSIS — E7849 Other hyperlipidemia: Secondary | ICD-10-CM | POA: Diagnosis not present

## 2018-05-24 DIAGNOSIS — H40023 Open angle with borderline findings, high risk, bilateral: Secondary | ICD-10-CM | POA: Diagnosis not present

## 2018-05-24 DIAGNOSIS — E440 Moderate protein-calorie malnutrition: Secondary | ICD-10-CM | POA: Diagnosis not present

## 2018-05-25 DIAGNOSIS — N2581 Secondary hyperparathyroidism of renal origin: Secondary | ICD-10-CM | POA: Diagnosis not present

## 2018-05-25 DIAGNOSIS — E7849 Other hyperlipidemia: Secondary | ICD-10-CM | POA: Diagnosis not present

## 2018-05-25 DIAGNOSIS — N2589 Other disorders resulting from impaired renal tubular function: Secondary | ICD-10-CM | POA: Diagnosis not present

## 2018-05-25 DIAGNOSIS — R82998 Other abnormal findings in urine: Secondary | ICD-10-CM | POA: Diagnosis not present

## 2018-05-25 DIAGNOSIS — N186 End stage renal disease: Secondary | ICD-10-CM | POA: Diagnosis not present

## 2018-05-25 DIAGNOSIS — D509 Iron deficiency anemia, unspecified: Secondary | ICD-10-CM | POA: Diagnosis not present

## 2018-05-25 DIAGNOSIS — D631 Anemia in chronic kidney disease: Secondary | ICD-10-CM | POA: Diagnosis not present

## 2018-05-25 DIAGNOSIS — E119 Type 2 diabetes mellitus without complications: Secondary | ICD-10-CM | POA: Diagnosis not present

## 2018-05-25 DIAGNOSIS — Z4932 Encounter for adequacy testing for peritoneal dialysis: Secondary | ICD-10-CM | POA: Diagnosis not present

## 2018-05-25 DIAGNOSIS — E440 Moderate protein-calorie malnutrition: Secondary | ICD-10-CM | POA: Diagnosis not present

## 2018-05-26 DIAGNOSIS — Z4932 Encounter for adequacy testing for peritoneal dialysis: Secondary | ICD-10-CM | POA: Diagnosis not present

## 2018-05-26 DIAGNOSIS — D509 Iron deficiency anemia, unspecified: Secondary | ICD-10-CM | POA: Diagnosis not present

## 2018-05-26 DIAGNOSIS — N2589 Other disorders resulting from impaired renal tubular function: Secondary | ICD-10-CM | POA: Diagnosis not present

## 2018-05-26 DIAGNOSIS — E440 Moderate protein-calorie malnutrition: Secondary | ICD-10-CM | POA: Diagnosis not present

## 2018-05-26 DIAGNOSIS — N2581 Secondary hyperparathyroidism of renal origin: Secondary | ICD-10-CM | POA: Diagnosis not present

## 2018-05-26 DIAGNOSIS — R82998 Other abnormal findings in urine: Secondary | ICD-10-CM | POA: Diagnosis not present

## 2018-05-26 DIAGNOSIS — N186 End stage renal disease: Secondary | ICD-10-CM | POA: Diagnosis not present

## 2018-05-26 DIAGNOSIS — E119 Type 2 diabetes mellitus without complications: Secondary | ICD-10-CM | POA: Diagnosis not present

## 2018-05-26 DIAGNOSIS — E7849 Other hyperlipidemia: Secondary | ICD-10-CM | POA: Diagnosis not present

## 2018-05-26 DIAGNOSIS — D631 Anemia in chronic kidney disease: Secondary | ICD-10-CM | POA: Diagnosis not present

## 2018-05-27 DIAGNOSIS — R82998 Other abnormal findings in urine: Secondary | ICD-10-CM | POA: Diagnosis not present

## 2018-05-27 DIAGNOSIS — N2581 Secondary hyperparathyroidism of renal origin: Secondary | ICD-10-CM | POA: Diagnosis not present

## 2018-05-27 DIAGNOSIS — Z4932 Encounter for adequacy testing for peritoneal dialysis: Secondary | ICD-10-CM | POA: Diagnosis not present

## 2018-05-27 DIAGNOSIS — N186 End stage renal disease: Secondary | ICD-10-CM | POA: Diagnosis not present

## 2018-05-27 DIAGNOSIS — N2589 Other disorders resulting from impaired renal tubular function: Secondary | ICD-10-CM | POA: Diagnosis not present

## 2018-05-27 DIAGNOSIS — D631 Anemia in chronic kidney disease: Secondary | ICD-10-CM | POA: Diagnosis not present

## 2018-05-27 DIAGNOSIS — D509 Iron deficiency anemia, unspecified: Secondary | ICD-10-CM | POA: Diagnosis not present

## 2018-05-27 DIAGNOSIS — E440 Moderate protein-calorie malnutrition: Secondary | ICD-10-CM | POA: Diagnosis not present

## 2018-05-27 DIAGNOSIS — E7849 Other hyperlipidemia: Secondary | ICD-10-CM | POA: Diagnosis not present

## 2018-05-27 DIAGNOSIS — E119 Type 2 diabetes mellitus without complications: Secondary | ICD-10-CM | POA: Diagnosis not present

## 2018-05-28 DIAGNOSIS — D509 Iron deficiency anemia, unspecified: Secondary | ICD-10-CM | POA: Diagnosis not present

## 2018-05-28 DIAGNOSIS — E119 Type 2 diabetes mellitus without complications: Secondary | ICD-10-CM | POA: Diagnosis not present

## 2018-05-28 DIAGNOSIS — N2581 Secondary hyperparathyroidism of renal origin: Secondary | ICD-10-CM | POA: Diagnosis not present

## 2018-05-28 DIAGNOSIS — E440 Moderate protein-calorie malnutrition: Secondary | ICD-10-CM | POA: Diagnosis not present

## 2018-05-28 DIAGNOSIS — E7849 Other hyperlipidemia: Secondary | ICD-10-CM | POA: Diagnosis not present

## 2018-05-28 DIAGNOSIS — N186 End stage renal disease: Secondary | ICD-10-CM | POA: Diagnosis not present

## 2018-05-28 DIAGNOSIS — D631 Anemia in chronic kidney disease: Secondary | ICD-10-CM | POA: Diagnosis not present

## 2018-05-28 DIAGNOSIS — R82998 Other abnormal findings in urine: Secondary | ICD-10-CM | POA: Diagnosis not present

## 2018-05-28 DIAGNOSIS — Z4932 Encounter for adequacy testing for peritoneal dialysis: Secondary | ICD-10-CM | POA: Diagnosis not present

## 2018-05-28 DIAGNOSIS — N2589 Other disorders resulting from impaired renal tubular function: Secondary | ICD-10-CM | POA: Diagnosis not present

## 2018-05-29 DIAGNOSIS — Z4932 Encounter for adequacy testing for peritoneal dialysis: Secondary | ICD-10-CM | POA: Diagnosis not present

## 2018-05-29 DIAGNOSIS — E440 Moderate protein-calorie malnutrition: Secondary | ICD-10-CM | POA: Diagnosis not present

## 2018-05-29 DIAGNOSIS — N2581 Secondary hyperparathyroidism of renal origin: Secondary | ICD-10-CM | POA: Diagnosis not present

## 2018-05-29 DIAGNOSIS — N186 End stage renal disease: Secondary | ICD-10-CM | POA: Diagnosis not present

## 2018-05-29 DIAGNOSIS — E7849 Other hyperlipidemia: Secondary | ICD-10-CM | POA: Diagnosis not present

## 2018-05-29 DIAGNOSIS — E119 Type 2 diabetes mellitus without complications: Secondary | ICD-10-CM | POA: Diagnosis not present

## 2018-05-29 DIAGNOSIS — D631 Anemia in chronic kidney disease: Secondary | ICD-10-CM | POA: Diagnosis not present

## 2018-05-29 DIAGNOSIS — D509 Iron deficiency anemia, unspecified: Secondary | ICD-10-CM | POA: Diagnosis not present

## 2018-05-29 DIAGNOSIS — R82998 Other abnormal findings in urine: Secondary | ICD-10-CM | POA: Diagnosis not present

## 2018-05-29 DIAGNOSIS — N2589 Other disorders resulting from impaired renal tubular function: Secondary | ICD-10-CM | POA: Diagnosis not present

## 2018-05-29 NOTE — Progress Notes (Signed)
MEDICARE ANNUAL WELLNESS VISIT AND FOLLOW UP  Assessment:     Medicare annual wellness exam  Essential hypertension -cont current meds -monitor at home -DASH diet -exercise   GERD -cont diet and exercise -medication prn   Secondary hyperparathyroidism (CKD) -Cont Vit D def   DJD -cont meds prn -avoid NSAIDs  Osteopenia -cont vit D   Primary osteoarthritis of right hip -heat as needed   ESRD (end stage renal disease) ([place]) -on dialysis at home - followed by Dr. Moshe Cipro   Anemia of chronic Renal Dz -monitor with CBC   Pancreatic cyst -monitor with pain   Gout without tophus, unspecified cause, unspecified chronicity, unspecified site -cont allopurinol  Vitamin D deficiency -cont supplement  Hyperlipidemia -cont diet and exercise -declines statins, pending lipids she wants to get back on welchol   Medication management Has labs every 2 weeks at kidney center - defer basic labs to q32m    BMI 25.0-25.9,adult -cont diet and exercise  Over 30 minutes of exam, counseling, chart review, and critical decision making was performed Future Appointments  Date Time Provider [place]  08/23/2018  8:15 AM Hayden Pedro, MD [facility] None  08/30/2018 11:00 AM Unk Pinto, MD [facility] None  03/10/2019  2:00 PM Unk Pinto, MD [facility] None    Plan:   During the course of the visit the patient was educated and counseled about appropriate screening and preventive services including:    Pneumococcal vaccine   Influenza vaccine  Td vaccine  Prevnar 13  Screening electrocardiogram  Screening mammography  Bone densitometry screening  Colorectal cancer screening  Diabetes screening  Glaucoma screening  Nutrition counseling   Advanced directives: given info/requested copies   Subjective:   Eileen Perez is a 77 y.o. female who presents for Medicare Annual Wellness Visit and 3 month follow up on hypertension, prediabetes,  hyperlipidemia, vitamin D def.   Patient has ESRD since 2017 on peritoneal dialysis at home, followed by Dr. Corliss Parish, no signs of fluid overload. She has history of secondary hyperparathyroidism.   BMI is Body mass index is 27.96 kg/m., she has been working on diet but not exercise.  Wt Readings from Last 3 Encounters:  05/30/18 148 lb (67.1 kg)  02/14/18 147 lb 12.8 oz (67 kg)  11/16/17 147 lb 12.8 oz (67 kg)   Her blood pressure has been controlled at home, today their BP is BP: 140/72 She does not workout. She denies chest pain, shortness of breath, dizziness.    Lab Results  Component Value Date   GFRAA 4 (L) 02/14/2018   She is not on cholesterol medication and denies myalgias. She was prescribed crestor but does not want to take, would prefer welchol.  Her cholesterol is not at goal. The cholesterol last visit was:   Lab Results  Component Value Date   CHOL 227 (H) 02/14/2018   HDL 56 02/14/2018   LDLCALC 137 (H) 02/14/2018   TRIG 206 (H) 02/14/2018   CHOLHDL 4.1 02/14/2018   She has been working on diet and exercise for prediabetes, and denies foot ulcerations, hyperglycemia, hypoglycemia , increased appetite, nausea, paresthesia of the feet, polydipsia, polyuria, visual disturbances, vomiting and weight loss. Last A1C in the office was:  Lab Results  Component Value Date   HGBA1C 5.9 (H) 02/14/2018   Patient is on Vitamin D supplement. Lab Results  Component Value Date   VD25OH 31 02/14/2018      Patient is on allopurinol for gout and does not report  a recent flare.  Lab Results  Component Value Date   LABURIC 2.9 02/14/2018    Medication Review Current Outpatient Medications on File Prior to Visit  Medication Sig Dispense Refill  . allopurinol (ZYLOPRIM) 300 MG tablet TAKE 1/2 TO 1 TABLET BY  MOUTH DAILY AS DIRECTED 90 tablet 3  . aspirin 81 MG tablet Take 81 mg by mouth at bedtime.     Marland Kitchen b complex-vitamin c-folic acid (NEPHRO-VITE) 0.8 MG TABS  tablet Take 1 tablet by mouth at bedtime.    . calcium acetate (PHOSLO) 667 MG capsule Take 667 mg by mouth 3 (three) times daily with meals.     . Cholecalciferol (VITAMIN D-3 PO) Take 5,000 Units by mouth every other day.     . cinacalcet (SENSIPAR) 30 MG tablet Take 30 mg by mouth daily.    Marland Kitchen docusate sodium (COLACE) 100 MG capsule Take 100 mg by mouth 2 (two) times daily.    . Flaxseed, Linseed, (FLAXSEED OIL) 1000 MG CAPS Take 1 capsule by mouth daily.     Marland Kitchen glucose blood (ACCU-CHEK AVIVA PLUS) test strip Test once daily 100 each 6  . hydrALAZINE (APRESOLINE) 10 MG tablet Please schedule overdue appt for future refills 2nd attempt. 120 tablet 0  . labetalol (NORMODYNE) 200 MG tablet TAKE 1 TABLET BY MOUTH TWO  TIMES DAILY 180 tablet 1  . Omega-3 Fatty Acids (FISH OIL) 1200 MG CAPS Take 2 capsules by mouth daily.    . sevelamer carbonate (RENVELA) 800 MG tablet Take 1,600 mg by mouth 3 (three) times daily with meals.     . zinc gluconate 50 MG tablet Take 50 mg by mouth every morning.    . calcitRIOL (ROCALTROL) 0.5 MCG capsule Take 0.5 mcg by mouth every other day.    . rosuvastatin (CRESTOR) 40 MG tablet Take 1/2 to 1 tablet daily or as directed for Cholesterol (Patient not taking: Reported on 05/30/2018) 90 tablet 5   No current facility-administered medications on file prior to visit.     Current Problems (verified) Patient Active Problem List   Diagnosis Date Noted  . Aortic atherosclerosis ([place]) 02/13/2018  . 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 Overweight (BMI 25.0-29.9) 10/31/2015  . ESRD (end stage renal disease) ([place]) 10/31/2015  . OA (osteoarthritis) of hip 05/26/2015  . Secondary hyperparathyroidism (CKD) 04/16/2014  . Vitamin D deficiency 04/16/2014  . Hyperlipidemia 04/16/2014  . Medication management 04/16/2014  . Anemia of chronic Renal Dz   . Pancreatic cyst   . GERD   . Gout   . Osteopenia   . Hypertension 06/10/2013  . DJD 06/09/2013    Screening  Tests Immunization History  Administered Date(s) Administered  . DT 02/05/2014  . Influenza, High Dose Seasonal PF 09/25/2013  . Influenza-Unspecified 08/11/2015, 07/21/2017  . Pneumococcal Polysaccharide-23 11/20/2009  . Td 11/21/2003  . Zoster 11/20/2005    Preventative care: Last colonoscopy: 2014 Last mammogram: 02/2018 DEXA: 2016  Prior vaccinations: TD or Tdap: 2015 Influenza: 2018  Pneumococcal: 2011 Prevnar13: Declines Shingles/Zostavax: 2007  Names of Other Physician/Practitioners you currently use: 1. [facility]- here for primary care 2. Dr. Herbert Deaner, eye doctor, last visit 01/31/2018 - report verified and abstracted 3. Does not see one, dentist, last visit 2015  Patient Care Team: Unk Pinto, MD as PCP - General (Internal Medicine) Gaynelle Arabian, MD as Consulting Physician (Orthopedic Surgery) Corliss Parish, MD as Consulting Physician (Nephrology) Monna Fam, MD as Consulting Physician (Ophthalmology) Laurence Spates, MD  as Consulting Physician (Gastroenterology) Stanford Breed Denice Bors, MD as Consulting Physician (Cardiology) Kathie Rhodes, MD as Consulting Physician (Urology)  Allergies Allergies  Allergen Reactions  . Ace Inhibitors     unknown  . Nsaids     unknown    SURGICAL HISTORY She  has a past surgical history that includes Pancreatic cyst excision (1999); Joint replacement (2012); Eye surgery (Left); Cholecystectomy; Total knee arthroplasty (Right, 06/09/2013); Colonoscopy (03/21/12); left knee replacement ; Total hip arthroplasty (Right, 05/26/2015); and Insertion of dialysis catheter (N/A, 02/15/2017). FAMILY HISTORY Her family history includes Cancer in her father and mother; Diabetes in her mother; Hypertension in her mother. SOCIAL HISTORY She  reports that she has never smoked. She has never used smokeless tobacco. She reports that she does not drink alcohol or use drugs.  MEDICARE WELLNESS  OBJECTIVES: Physical activity: Current Exercise Habits: The patient does not participate in regular exercise at present, Exercise limited by: None identified Cardiac risk factors: Cardiac Risk Factors include: hypertension;advanced age (>6men, >24 women);dyslipidemia;sedentary lifestyle;microalbuminuria Depression/mood screen:   Depression screen [facility] 2/9 05/30/2018  Decreased Interest 0  Down, Depressed, Hopeless 0  PHQ - 2 Score 0    ADLs:  In your present state of health, do you have any difficulty performing the following activities: 05/30/2018 02/14/2018  Hearing? N N  Vision? N N  Difficulty concentrating or making decisions? N N  Walking or climbing stairs? N N  Dressing or bathing? N N  Doing errands, shopping? N N  Some recent data might be hidden     Cognitive Testing  Alert? Yes  Normal Appearance?Yes  Oriented to person? Yes  Place? Yes   Time? Yes  Recall of three objects?  Yes  Can perform simple calculations? Yes  Displays appropriate judgment?Yes  Can read the correct time from a watch face?Yes  EOL planning: Does Patient Have a Medical Advance Directive?: Yes Type of Advance Directive: Healthcare Power of Attorney, Living will Does patient want to make changes to medical advance directive?: No - Patient declined Copy of [place] in Chart?: No - copy requested   Objective:   Today's Vitals   05/30/18 0929  BP: 140/72  Pulse: 74  Temp: (!) 97.4 F (36.3 C)  SpO2: 98%  Weight: 148 lb (67.1 kg)  Height: 5\' 1"  (1.549 m)   Body mass index is 27.96 kg/m.  General appearance: alert, no distress, WD/WN,  female HEENT: normocephalic, sclerae anicteric, TMs pearly, nares patent, no discharge or erythema, pharynx normal Oral cavity: MMM, no lesions Neck: supple, no lymphadenopathy, no thyromegaly, no masses Heart: RRR, normal S1, S2, no murmurs Lungs: CTA bilaterally, no wheezes, rhonchi, or rales Abdomen: +bs, soft, non tender, non  distended, no masses, no hepatomegaly, no splenomegaly, access site to LLQ without discharge or injection.  Musculoskeletal: nontender, no swelling, no obvious deformity Extremities: no edema, no cyanosis, no clubbing Pulses: 2+ symmetric, upper and lower extremities, normal cap refill Neurological: alert, oriented x 3, CN2-12 intact, strength normal upper extremities and lower extremities, sensation normal throughout, DTRs 2+ throughout, no cerebellar signs, gait normal Psychiatric: normal affect, behavior normal, pleasant  Breast: defer Gyn: defer Rectal: defer   Medicare Attestation I have personally reviewed: The patient's medical and social history Their use of alcohol, tobacco or illicit drugs Their current medications and supplements The patient's functional ability including ADLs,fall risks, home safety risks, cognitive, and hearing and visual impairment Diet and physical activities Evidence for depression or mood disorders  The patient's  weight, height, BMI, and visual acuity have been recorded in the chart.  I have made referrals, counseling, and provided education to the patient based on review of the above and I have provided the patient with a written personalized care plan for preventive services.     Izora Ribas, NP   05/30/2018

## 2018-05-30 ENCOUNTER — Ambulatory Visit (INDEPENDENT_AMBULATORY_CARE_PROVIDER_SITE_OTHER): Payer: Medicare Other | Admitting: Adult Health

## 2018-05-30 ENCOUNTER — Encounter: Payer: Self-pay | Admitting: Adult Health

## 2018-05-30 VITALS — BP 140/72 | HR 74 | Temp 97.4°F | Ht 61.0 in | Wt 148.0 lb

## 2018-05-30 DIAGNOSIS — N186 End stage renal disease: Secondary | ICD-10-CM

## 2018-05-30 DIAGNOSIS — E559 Vitamin D deficiency, unspecified: Secondary | ICD-10-CM | POA: Diagnosis not present

## 2018-05-30 DIAGNOSIS — K862 Cyst of pancreas: Secondary | ICD-10-CM | POA: Diagnosis not present

## 2018-05-30 DIAGNOSIS — R82998 Other abnormal findings in urine: Secondary | ICD-10-CM | POA: Diagnosis not present

## 2018-05-30 DIAGNOSIS — E7849 Other hyperlipidemia: Secondary | ICD-10-CM | POA: Diagnosis not present

## 2018-05-30 DIAGNOSIS — I700 Atherosclerosis of aorta: Secondary | ICD-10-CM

## 2018-05-30 DIAGNOSIS — K2100 Gastro-esophageal reflux disease with esophagitis, without bleeding: Secondary | ICD-10-CM

## 2018-05-30 DIAGNOSIS — M8580 Other specified disorders of bone density and structure, unspecified site: Secondary | ICD-10-CM

## 2018-05-30 DIAGNOSIS — E119 Type 2 diabetes mellitus without complications: Secondary | ICD-10-CM | POA: Diagnosis not present

## 2018-05-30 DIAGNOSIS — E663 Overweight: Secondary | ICD-10-CM

## 2018-05-30 DIAGNOSIS — Z4932 Encounter for adequacy testing for peritoneal dialysis: Secondary | ICD-10-CM | POA: Diagnosis not present

## 2018-05-30 DIAGNOSIS — E0821 Diabetes mellitus due to underlying condition with diabetic nephropathy: Secondary | ICD-10-CM | POA: Diagnosis not present

## 2018-05-30 DIAGNOSIS — M1000 Idiopathic gout, unspecified site: Secondary | ICD-10-CM

## 2018-05-30 DIAGNOSIS — M159 Polyosteoarthritis, unspecified: Secondary | ICD-10-CM | POA: Diagnosis not present

## 2018-05-30 DIAGNOSIS — N2581 Secondary hyperparathyroidism of renal origin: Secondary | ICD-10-CM | POA: Diagnosis not present

## 2018-05-30 DIAGNOSIS — E782 Mixed hyperlipidemia: Secondary | ICD-10-CM | POA: Diagnosis not present

## 2018-05-30 DIAGNOSIS — R6889 Other general symptoms and signs: Secondary | ICD-10-CM | POA: Diagnosis not present

## 2018-05-30 DIAGNOSIS — D509 Iron deficiency anemia, unspecified: Secondary | ICD-10-CM | POA: Diagnosis not present

## 2018-05-30 DIAGNOSIS — E440 Moderate protein-calorie malnutrition: Secondary | ICD-10-CM | POA: Diagnosis not present

## 2018-05-30 DIAGNOSIS — Z0001 Encounter for general adult medical examination with abnormal findings: Secondary | ICD-10-CM | POA: Diagnosis not present

## 2018-05-30 DIAGNOSIS — Z79899 Other long term (current) drug therapy: Secondary | ICD-10-CM

## 2018-05-30 DIAGNOSIS — D631 Anemia in chronic kidney disease: Secondary | ICD-10-CM | POA: Diagnosis not present

## 2018-05-30 DIAGNOSIS — D638 Anemia in other chronic diseases classified elsewhere: Secondary | ICD-10-CM

## 2018-05-30 DIAGNOSIS — Z0000 Encounter for general adult medical examination without abnormal findings: Secondary | ICD-10-CM

## 2018-05-30 DIAGNOSIS — I10 Essential (primary) hypertension: Secondary | ICD-10-CM | POA: Diagnosis not present

## 2018-05-30 DIAGNOSIS — N2589 Other disorders resulting from impaired renal tubular function: Secondary | ICD-10-CM | POA: Diagnosis not present

## 2018-05-30 NOTE — Patient Instructions (Signed)
Plan on 30 min of exercise 5 days a week   Aim for 7+ servings of fruits and vegetables daily  80+ fluid ounces of water or unsweet tea for healthy kidneys  Limit animal fats in diet for cholesterol and heart health - choose grass fed whenever available  Aim for low stress - take time to unwind and care for your mental health  Aim for 150 min of moderate intensity exercise weekly for heart health, and weights twice weekly for bone health  Aim for 7-9 hours of sleep daily      When it comes to diets, agreement about the perfect plan isn't easy to find, even among the experts. Experts at the [employer] developed an idea known as the Healthy Eating Plate. Just imagine a plate divided into logical, healthy portions.  The emphasis is on diet quality:  Load up on vegetables and fruits - one-half of your plate: Aim for color and variety, and remember that potatoes don't count.  Go for whole grains - one-quarter of your plate: Whole wheat, barley, wheat berries, quinoa, oats, brown rice, and foods made with them. If you want pasta, go with whole wheat pasta.  Protein power - one-quarter of your plate: Fish, chicken, beans, and nuts are all healthy, versatile protein sources. Limit red meat.  The diet, however, does go beyond the plate, offering a few other suggestions.  Use healthy plant oils, such as olive, canola, soy, corn, sunflower and peanut. Check the labels, and avoid partially hydrogenated oil, which have unhealthy trans fats.  If you're thirsty, drink water. Coffee and tea are good in moderation, but skip sugary drinks and limit milk and dairy products to one or two daily servings.  The type of carbohydrate in the diet is more important than the amount. Some sources of carbohydrates, such as vegetables, fruits, whole grains, and beans-are healthier than others.  Finally, stay active.

## 2018-05-31 ENCOUNTER — Other Ambulatory Visit: Payer: Self-pay | Admitting: *Deleted

## 2018-05-31 DIAGNOSIS — D631 Anemia in chronic kidney disease: Secondary | ICD-10-CM | POA: Diagnosis not present

## 2018-05-31 DIAGNOSIS — E119 Type 2 diabetes mellitus without complications: Secondary | ICD-10-CM | POA: Diagnosis not present

## 2018-05-31 DIAGNOSIS — E440 Moderate protein-calorie malnutrition: Secondary | ICD-10-CM | POA: Diagnosis not present

## 2018-05-31 DIAGNOSIS — E7849 Other hyperlipidemia: Secondary | ICD-10-CM | POA: Diagnosis not present

## 2018-05-31 DIAGNOSIS — R82998 Other abnormal findings in urine: Secondary | ICD-10-CM | POA: Diagnosis not present

## 2018-05-31 DIAGNOSIS — D509 Iron deficiency anemia, unspecified: Secondary | ICD-10-CM | POA: Diagnosis not present

## 2018-05-31 DIAGNOSIS — N2581 Secondary hyperparathyroidism of renal origin: Secondary | ICD-10-CM | POA: Diagnosis not present

## 2018-05-31 DIAGNOSIS — Z4932 Encounter for adequacy testing for peritoneal dialysis: Secondary | ICD-10-CM | POA: Diagnosis not present

## 2018-05-31 DIAGNOSIS — N2589 Other disorders resulting from impaired renal tubular function: Secondary | ICD-10-CM | POA: Diagnosis not present

## 2018-05-31 DIAGNOSIS — N186 End stage renal disease: Secondary | ICD-10-CM | POA: Diagnosis not present

## 2018-05-31 LAB — HEPATIC FUNCTION PANEL
AG RATIO: 1.5 (calc) (ref 1.0–2.5)
ALBUMIN MSPROF: 3.8 g/dL (ref 3.6–5.1)
ALT: 13 U/L (ref 6–29)
AST: 17 U/L (ref 10–35)
Alkaline phosphatase (APISO): 213 U/L — ABNORMAL HIGH (ref 33–130)
BILIRUBIN DIRECT: 0.1 mg/dL (ref 0.0–0.2)
Globulin: 2.5 g/dL (calc) (ref 1.9–3.7)
Indirect Bilirubin: 0.3 mg/dL (calc) (ref 0.2–1.2)
Total Bilirubin: 0.4 mg/dL (ref 0.2–1.2)
Total Protein: 6.3 g/dL (ref 6.1–8.1)

## 2018-05-31 LAB — LIPID PANEL
CHOLESTEROL: 213 mg/dL — AB (ref <200)
HDL: 46 mg/dL — ABNORMAL LOW (ref >50)
LDL Cholesterol (Calc): 123 mg/dL (calc) — ABNORMAL HIGH
Non-HDL Cholesterol (Calc): 167 mg/dL (calc) — ABNORMAL HIGH (ref <130)
TRIGLYCERIDES: 289 mg/dL — AB (ref <150)
Total CHOL/HDL Ratio: 4.6 (calc) (ref <5.0)

## 2018-05-31 LAB — HEMOGLOBIN A1C
Hgb A1c MFr Bld: 5.6 % of total Hgb (ref <5.7)
Mean Plasma Glucose: 114 (calc)
eAG (mmol/L): 6.3 (calc)

## 2018-05-31 LAB — MAGNESIUM: MAGNESIUM: 2.1 mg/dL (ref 1.5–2.5)

## 2018-05-31 LAB — TSH: TSH: 2.25 mIU/L (ref 0.40–4.50)

## 2018-05-31 MED ORDER — HYDRALAZINE HCL 10 MG PO TABS: 20.0000 mg | ORAL_TABLET | Freq: Two times a day (BID) | ORAL | 0 refills | Status: DC

## 2018-05-31 NOTE — Telephone Encounter (Signed)
Denise with [facility] left a msg on the refill vm requesting clarification on the hydralazine. Please return call at [phone] and provide reference number [id number]. Thanks, MI

## 2018-05-31 NOTE — Telephone Encounter (Signed)
Rx(s) sent to pharmacy electronically.  Previous Rx did not have medication instructions, only stated that patient needed an appointment in the sig.

## 2018-06-01 DIAGNOSIS — E7849 Other hyperlipidemia: Secondary | ICD-10-CM | POA: Diagnosis not present

## 2018-06-01 DIAGNOSIS — N2589 Other disorders resulting from impaired renal tubular function: Secondary | ICD-10-CM | POA: Diagnosis not present

## 2018-06-01 DIAGNOSIS — N186 End stage renal disease: Secondary | ICD-10-CM | POA: Diagnosis not present

## 2018-06-01 DIAGNOSIS — E119 Type 2 diabetes mellitus without complications: Secondary | ICD-10-CM | POA: Diagnosis not present

## 2018-06-01 DIAGNOSIS — D631 Anemia in chronic kidney disease: Secondary | ICD-10-CM | POA: Diagnosis not present

## 2018-06-01 DIAGNOSIS — D509 Iron deficiency anemia, unspecified: Secondary | ICD-10-CM | POA: Diagnosis not present

## 2018-06-01 DIAGNOSIS — R82998 Other abnormal findings in urine: Secondary | ICD-10-CM | POA: Diagnosis not present

## 2018-06-01 DIAGNOSIS — Z4932 Encounter for adequacy testing for peritoneal dialysis: Secondary | ICD-10-CM | POA: Diagnosis not present

## 2018-06-01 DIAGNOSIS — E440 Moderate protein-calorie malnutrition: Secondary | ICD-10-CM | POA: Diagnosis not present

## 2018-06-01 DIAGNOSIS — N2581 Secondary hyperparathyroidism of renal origin: Secondary | ICD-10-CM | POA: Diagnosis not present

## 2018-06-02 DIAGNOSIS — R82998 Other abnormal findings in urine: Secondary | ICD-10-CM | POA: Diagnosis not present

## 2018-06-02 DIAGNOSIS — D509 Iron deficiency anemia, unspecified: Secondary | ICD-10-CM | POA: Diagnosis not present

## 2018-06-02 DIAGNOSIS — N2581 Secondary hyperparathyroidism of renal origin: Secondary | ICD-10-CM | POA: Diagnosis not present

## 2018-06-02 DIAGNOSIS — Z4932 Encounter for adequacy testing for peritoneal dialysis: Secondary | ICD-10-CM | POA: Diagnosis not present

## 2018-06-02 DIAGNOSIS — N2589 Other disorders resulting from impaired renal tubular function: Secondary | ICD-10-CM | POA: Diagnosis not present

## 2018-06-02 DIAGNOSIS — D631 Anemia in chronic kidney disease: Secondary | ICD-10-CM | POA: Diagnosis not present

## 2018-06-02 DIAGNOSIS — E7849 Other hyperlipidemia: Secondary | ICD-10-CM | POA: Diagnosis not present

## 2018-06-02 DIAGNOSIS — N186 End stage renal disease: Secondary | ICD-10-CM | POA: Diagnosis not present

## 2018-06-02 DIAGNOSIS — E440 Moderate protein-calorie malnutrition: Secondary | ICD-10-CM | POA: Diagnosis not present

## 2018-06-02 DIAGNOSIS — E119 Type 2 diabetes mellitus without complications: Secondary | ICD-10-CM | POA: Diagnosis not present

## 2018-06-03 DIAGNOSIS — R82998 Other abnormal findings in urine: Secondary | ICD-10-CM | POA: Diagnosis not present

## 2018-06-03 DIAGNOSIS — D631 Anemia in chronic kidney disease: Secondary | ICD-10-CM | POA: Diagnosis not present

## 2018-06-03 DIAGNOSIS — N2589 Other disorders resulting from impaired renal tubular function: Secondary | ICD-10-CM | POA: Diagnosis not present

## 2018-06-03 DIAGNOSIS — E440 Moderate protein-calorie malnutrition: Secondary | ICD-10-CM | POA: Diagnosis not present

## 2018-06-03 DIAGNOSIS — N186 End stage renal disease: Secondary | ICD-10-CM | POA: Diagnosis not present

## 2018-06-03 DIAGNOSIS — E119 Type 2 diabetes mellitus without complications: Secondary | ICD-10-CM | POA: Diagnosis not present

## 2018-06-03 DIAGNOSIS — E7849 Other hyperlipidemia: Secondary | ICD-10-CM | POA: Diagnosis not present

## 2018-06-03 DIAGNOSIS — D509 Iron deficiency anemia, unspecified: Secondary | ICD-10-CM | POA: Diagnosis not present

## 2018-06-03 DIAGNOSIS — N2581 Secondary hyperparathyroidism of renal origin: Secondary | ICD-10-CM | POA: Diagnosis not present

## 2018-06-03 DIAGNOSIS — Z4932 Encounter for adequacy testing for peritoneal dialysis: Secondary | ICD-10-CM | POA: Diagnosis not present

## 2018-06-04 DIAGNOSIS — R82998 Other abnormal findings in urine: Secondary | ICD-10-CM | POA: Diagnosis not present

## 2018-06-04 DIAGNOSIS — N2589 Other disorders resulting from impaired renal tubular function: Secondary | ICD-10-CM | POA: Diagnosis not present

## 2018-06-04 DIAGNOSIS — E440 Moderate protein-calorie malnutrition: Secondary | ICD-10-CM | POA: Diagnosis not present

## 2018-06-04 DIAGNOSIS — D631 Anemia in chronic kidney disease: Secondary | ICD-10-CM | POA: Diagnosis not present

## 2018-06-04 DIAGNOSIS — N186 End stage renal disease: Secondary | ICD-10-CM | POA: Diagnosis not present

## 2018-06-04 DIAGNOSIS — Z4932 Encounter for adequacy testing for peritoneal dialysis: Secondary | ICD-10-CM | POA: Diagnosis not present

## 2018-06-04 DIAGNOSIS — E119 Type 2 diabetes mellitus without complications: Secondary | ICD-10-CM | POA: Diagnosis not present

## 2018-06-04 DIAGNOSIS — D509 Iron deficiency anemia, unspecified: Secondary | ICD-10-CM | POA: Diagnosis not present

## 2018-06-04 DIAGNOSIS — N2581 Secondary hyperparathyroidism of renal origin: Secondary | ICD-10-CM | POA: Diagnosis not present

## 2018-06-04 DIAGNOSIS — E7849 Other hyperlipidemia: Secondary | ICD-10-CM | POA: Diagnosis not present

## 2018-06-05 DIAGNOSIS — E7849 Other hyperlipidemia: Secondary | ICD-10-CM | POA: Diagnosis not present

## 2018-06-05 DIAGNOSIS — D509 Iron deficiency anemia, unspecified: Secondary | ICD-10-CM | POA: Diagnosis not present

## 2018-06-05 DIAGNOSIS — E119 Type 2 diabetes mellitus without complications: Secondary | ICD-10-CM | POA: Diagnosis not present

## 2018-06-05 DIAGNOSIS — R82998 Other abnormal findings in urine: Secondary | ICD-10-CM | POA: Diagnosis not present

## 2018-06-05 DIAGNOSIS — D631 Anemia in chronic kidney disease: Secondary | ICD-10-CM | POA: Diagnosis not present

## 2018-06-05 DIAGNOSIS — N2589 Other disorders resulting from impaired renal tubular function: Secondary | ICD-10-CM | POA: Diagnosis not present

## 2018-06-05 DIAGNOSIS — N186 End stage renal disease: Secondary | ICD-10-CM | POA: Diagnosis not present

## 2018-06-05 DIAGNOSIS — E440 Moderate protein-calorie malnutrition: Secondary | ICD-10-CM | POA: Diagnosis not present

## 2018-06-05 DIAGNOSIS — N2581 Secondary hyperparathyroidism of renal origin: Secondary | ICD-10-CM | POA: Diagnosis not present

## 2018-06-05 DIAGNOSIS — Z4932 Encounter for adequacy testing for peritoneal dialysis: Secondary | ICD-10-CM | POA: Diagnosis not present

## 2018-06-06 DIAGNOSIS — E7849 Other hyperlipidemia: Secondary | ICD-10-CM | POA: Diagnosis not present

## 2018-06-06 DIAGNOSIS — D509 Iron deficiency anemia, unspecified: Secondary | ICD-10-CM | POA: Diagnosis not present

## 2018-06-06 DIAGNOSIS — R82998 Other abnormal findings in urine: Secondary | ICD-10-CM | POA: Diagnosis not present

## 2018-06-06 DIAGNOSIS — Z4932 Encounter for adequacy testing for peritoneal dialysis: Secondary | ICD-10-CM | POA: Diagnosis not present

## 2018-06-06 DIAGNOSIS — N186 End stage renal disease: Secondary | ICD-10-CM | POA: Diagnosis not present

## 2018-06-06 DIAGNOSIS — E440 Moderate protein-calorie malnutrition: Secondary | ICD-10-CM | POA: Diagnosis not present

## 2018-06-06 DIAGNOSIS — E119 Type 2 diabetes mellitus without complications: Secondary | ICD-10-CM | POA: Diagnosis not present

## 2018-06-06 DIAGNOSIS — D631 Anemia in chronic kidney disease: Secondary | ICD-10-CM | POA: Diagnosis not present

## 2018-06-06 DIAGNOSIS — N2581 Secondary hyperparathyroidism of renal origin: Secondary | ICD-10-CM | POA: Diagnosis not present

## 2018-06-06 DIAGNOSIS — N2589 Other disorders resulting from impaired renal tubular function: Secondary | ICD-10-CM | POA: Diagnosis not present

## 2018-06-07 DIAGNOSIS — Z4932 Encounter for adequacy testing for peritoneal dialysis: Secondary | ICD-10-CM | POA: Diagnosis not present

## 2018-06-07 DIAGNOSIS — E440 Moderate protein-calorie malnutrition: Secondary | ICD-10-CM | POA: Diagnosis not present

## 2018-06-07 DIAGNOSIS — D509 Iron deficiency anemia, unspecified: Secondary | ICD-10-CM | POA: Diagnosis not present

## 2018-06-07 DIAGNOSIS — E119 Type 2 diabetes mellitus without complications: Secondary | ICD-10-CM | POA: Diagnosis not present

## 2018-06-07 DIAGNOSIS — N186 End stage renal disease: Secondary | ICD-10-CM | POA: Diagnosis not present

## 2018-06-07 DIAGNOSIS — N2589 Other disorders resulting from impaired renal tubular function: Secondary | ICD-10-CM | POA: Diagnosis not present

## 2018-06-07 DIAGNOSIS — R82998 Other abnormal findings in urine: Secondary | ICD-10-CM | POA: Diagnosis not present

## 2018-06-07 DIAGNOSIS — E7849 Other hyperlipidemia: Secondary | ICD-10-CM | POA: Diagnosis not present

## 2018-06-07 DIAGNOSIS — D631 Anemia in chronic kidney disease: Secondary | ICD-10-CM | POA: Diagnosis not present

## 2018-06-07 DIAGNOSIS — N2581 Secondary hyperparathyroidism of renal origin: Secondary | ICD-10-CM | POA: Diagnosis not present

## 2018-06-08 DIAGNOSIS — E440 Moderate protein-calorie malnutrition: Secondary | ICD-10-CM | POA: Diagnosis not present

## 2018-06-08 DIAGNOSIS — R82998 Other abnormal findings in urine: Secondary | ICD-10-CM | POA: Diagnosis not present

## 2018-06-08 DIAGNOSIS — E7849 Other hyperlipidemia: Secondary | ICD-10-CM | POA: Diagnosis not present

## 2018-06-08 DIAGNOSIS — E119 Type 2 diabetes mellitus without complications: Secondary | ICD-10-CM | POA: Diagnosis not present

## 2018-06-08 DIAGNOSIS — N2581 Secondary hyperparathyroidism of renal origin: Secondary | ICD-10-CM | POA: Diagnosis not present

## 2018-06-08 DIAGNOSIS — Z4932 Encounter for adequacy testing for peritoneal dialysis: Secondary | ICD-10-CM | POA: Diagnosis not present

## 2018-06-08 DIAGNOSIS — D509 Iron deficiency anemia, unspecified: Secondary | ICD-10-CM | POA: Diagnosis not present

## 2018-06-08 DIAGNOSIS — N186 End stage renal disease: Secondary | ICD-10-CM | POA: Diagnosis not present

## 2018-06-08 DIAGNOSIS — D631 Anemia in chronic kidney disease: Secondary | ICD-10-CM | POA: Diagnosis not present

## 2018-06-08 DIAGNOSIS — N2589 Other disorders resulting from impaired renal tubular function: Secondary | ICD-10-CM | POA: Diagnosis not present

## 2018-06-09 DIAGNOSIS — N2581 Secondary hyperparathyroidism of renal origin: Secondary | ICD-10-CM | POA: Diagnosis not present

## 2018-06-09 DIAGNOSIS — N2589 Other disorders resulting from impaired renal tubular function: Secondary | ICD-10-CM | POA: Diagnosis not present

## 2018-06-09 DIAGNOSIS — R82998 Other abnormal findings in urine: Secondary | ICD-10-CM | POA: Diagnosis not present

## 2018-06-09 DIAGNOSIS — E440 Moderate protein-calorie malnutrition: Secondary | ICD-10-CM | POA: Diagnosis not present

## 2018-06-09 DIAGNOSIS — N186 End stage renal disease: Secondary | ICD-10-CM | POA: Diagnosis not present

## 2018-06-09 DIAGNOSIS — Z4932 Encounter for adequacy testing for peritoneal dialysis: Secondary | ICD-10-CM | POA: Diagnosis not present

## 2018-06-09 DIAGNOSIS — E119 Type 2 diabetes mellitus without complications: Secondary | ICD-10-CM | POA: Diagnosis not present

## 2018-06-09 DIAGNOSIS — D509 Iron deficiency anemia, unspecified: Secondary | ICD-10-CM | POA: Diagnosis not present

## 2018-06-09 DIAGNOSIS — E7849 Other hyperlipidemia: Secondary | ICD-10-CM | POA: Diagnosis not present

## 2018-06-09 DIAGNOSIS — D631 Anemia in chronic kidney disease: Secondary | ICD-10-CM | POA: Diagnosis not present

## 2018-06-10 DIAGNOSIS — D509 Iron deficiency anemia, unspecified: Secondary | ICD-10-CM | POA: Diagnosis not present

## 2018-06-10 DIAGNOSIS — N2589 Other disorders resulting from impaired renal tubular function: Secondary | ICD-10-CM | POA: Diagnosis not present

## 2018-06-10 DIAGNOSIS — E7849 Other hyperlipidemia: Secondary | ICD-10-CM | POA: Diagnosis not present

## 2018-06-10 DIAGNOSIS — D631 Anemia in chronic kidney disease: Secondary | ICD-10-CM | POA: Diagnosis not present

## 2018-06-10 DIAGNOSIS — R82998 Other abnormal findings in urine: Secondary | ICD-10-CM | POA: Diagnosis not present

## 2018-06-10 DIAGNOSIS — N186 End stage renal disease: Secondary | ICD-10-CM | POA: Diagnosis not present

## 2018-06-10 DIAGNOSIS — E119 Type 2 diabetes mellitus without complications: Secondary | ICD-10-CM | POA: Diagnosis not present

## 2018-06-10 DIAGNOSIS — N2581 Secondary hyperparathyroidism of renal origin: Secondary | ICD-10-CM | POA: Diagnosis not present

## 2018-06-10 DIAGNOSIS — Z4932 Encounter for adequacy testing for peritoneal dialysis: Secondary | ICD-10-CM | POA: Diagnosis not present

## 2018-06-10 DIAGNOSIS — E440 Moderate protein-calorie malnutrition: Secondary | ICD-10-CM | POA: Diagnosis not present

## 2018-06-11 DIAGNOSIS — E7849 Other hyperlipidemia: Secondary | ICD-10-CM | POA: Diagnosis not present

## 2018-06-11 DIAGNOSIS — E119 Type 2 diabetes mellitus without complications: Secondary | ICD-10-CM | POA: Diagnosis not present

## 2018-06-11 DIAGNOSIS — D631 Anemia in chronic kidney disease: Secondary | ICD-10-CM | POA: Diagnosis not present

## 2018-06-11 DIAGNOSIS — N2589 Other disorders resulting from impaired renal tubular function: Secondary | ICD-10-CM | POA: Diagnosis not present

## 2018-06-11 DIAGNOSIS — R82998 Other abnormal findings in urine: Secondary | ICD-10-CM | POA: Diagnosis not present

## 2018-06-11 DIAGNOSIS — E440 Moderate protein-calorie malnutrition: Secondary | ICD-10-CM | POA: Diagnosis not present

## 2018-06-11 DIAGNOSIS — D509 Iron deficiency anemia, unspecified: Secondary | ICD-10-CM | POA: Diagnosis not present

## 2018-06-11 DIAGNOSIS — N2581 Secondary hyperparathyroidism of renal origin: Secondary | ICD-10-CM | POA: Diagnosis not present

## 2018-06-11 DIAGNOSIS — Z4932 Encounter for adequacy testing for peritoneal dialysis: Secondary | ICD-10-CM | POA: Diagnosis not present

## 2018-06-11 DIAGNOSIS — N186 End stage renal disease: Secondary | ICD-10-CM | POA: Diagnosis not present

## 2018-06-12 DIAGNOSIS — Z4932 Encounter for adequacy testing for peritoneal dialysis: Secondary | ICD-10-CM | POA: Diagnosis not present

## 2018-06-12 DIAGNOSIS — N2581 Secondary hyperparathyroidism of renal origin: Secondary | ICD-10-CM | POA: Diagnosis not present

## 2018-06-12 DIAGNOSIS — E7849 Other hyperlipidemia: Secondary | ICD-10-CM | POA: Diagnosis not present

## 2018-06-12 DIAGNOSIS — D509 Iron deficiency anemia, unspecified: Secondary | ICD-10-CM | POA: Diagnosis not present

## 2018-06-12 DIAGNOSIS — N2589 Other disorders resulting from impaired renal tubular function: Secondary | ICD-10-CM | POA: Diagnosis not present

## 2018-06-12 DIAGNOSIS — D631 Anemia in chronic kidney disease: Secondary | ICD-10-CM | POA: Diagnosis not present

## 2018-06-12 DIAGNOSIS — E440 Moderate protein-calorie malnutrition: Secondary | ICD-10-CM | POA: Diagnosis not present

## 2018-06-12 DIAGNOSIS — N186 End stage renal disease: Secondary | ICD-10-CM | POA: Diagnosis not present

## 2018-06-12 DIAGNOSIS — E119 Type 2 diabetes mellitus without complications: Secondary | ICD-10-CM | POA: Diagnosis not present

## 2018-06-12 DIAGNOSIS — R82998 Other abnormal findings in urine: Secondary | ICD-10-CM | POA: Diagnosis not present

## 2018-06-13 DIAGNOSIS — R82998 Other abnormal findings in urine: Secondary | ICD-10-CM | POA: Diagnosis not present

## 2018-06-13 DIAGNOSIS — E440 Moderate protein-calorie malnutrition: Secondary | ICD-10-CM | POA: Diagnosis not present

## 2018-06-13 DIAGNOSIS — N2581 Secondary hyperparathyroidism of renal origin: Secondary | ICD-10-CM | POA: Diagnosis not present

## 2018-06-13 DIAGNOSIS — N186 End stage renal disease: Secondary | ICD-10-CM | POA: Diagnosis not present

## 2018-06-13 DIAGNOSIS — Z4932 Encounter for adequacy testing for peritoneal dialysis: Secondary | ICD-10-CM | POA: Diagnosis not present

## 2018-06-13 DIAGNOSIS — E7849 Other hyperlipidemia: Secondary | ICD-10-CM | POA: Diagnosis not present

## 2018-06-13 DIAGNOSIS — E119 Type 2 diabetes mellitus without complications: Secondary | ICD-10-CM | POA: Diagnosis not present

## 2018-06-13 DIAGNOSIS — D631 Anemia in chronic kidney disease: Secondary | ICD-10-CM | POA: Diagnosis not present

## 2018-06-13 DIAGNOSIS — N2589 Other disorders resulting from impaired renal tubular function: Secondary | ICD-10-CM | POA: Diagnosis not present

## 2018-06-13 DIAGNOSIS — D509 Iron deficiency anemia, unspecified: Secondary | ICD-10-CM | POA: Diagnosis not present

## 2018-06-14 DIAGNOSIS — N2589 Other disorders resulting from impaired renal tubular function: Secondary | ICD-10-CM | POA: Diagnosis not present

## 2018-06-14 DIAGNOSIS — R82998 Other abnormal findings in urine: Secondary | ICD-10-CM | POA: Diagnosis not present

## 2018-06-14 DIAGNOSIS — N2581 Secondary hyperparathyroidism of renal origin: Secondary | ICD-10-CM | POA: Diagnosis not present

## 2018-06-14 DIAGNOSIS — D509 Iron deficiency anemia, unspecified: Secondary | ICD-10-CM | POA: Diagnosis not present

## 2018-06-14 DIAGNOSIS — Z4932 Encounter for adequacy testing for peritoneal dialysis: Secondary | ICD-10-CM | POA: Diagnosis not present

## 2018-06-14 DIAGNOSIS — D631 Anemia in chronic kidney disease: Secondary | ICD-10-CM | POA: Diagnosis not present

## 2018-06-14 DIAGNOSIS — E7849 Other hyperlipidemia: Secondary | ICD-10-CM | POA: Diagnosis not present

## 2018-06-14 DIAGNOSIS — E440 Moderate protein-calorie malnutrition: Secondary | ICD-10-CM | POA: Diagnosis not present

## 2018-06-14 DIAGNOSIS — N186 End stage renal disease: Secondary | ICD-10-CM | POA: Diagnosis not present

## 2018-06-14 DIAGNOSIS — E119 Type 2 diabetes mellitus without complications: Secondary | ICD-10-CM | POA: Diagnosis not present

## 2018-06-15 DIAGNOSIS — E7849 Other hyperlipidemia: Secondary | ICD-10-CM | POA: Diagnosis not present

## 2018-06-15 DIAGNOSIS — Z4932 Encounter for adequacy testing for peritoneal dialysis: Secondary | ICD-10-CM | POA: Diagnosis not present

## 2018-06-15 DIAGNOSIS — E119 Type 2 diabetes mellitus without complications: Secondary | ICD-10-CM | POA: Diagnosis not present

## 2018-06-15 DIAGNOSIS — E440 Moderate protein-calorie malnutrition: Secondary | ICD-10-CM | POA: Diagnosis not present

## 2018-06-15 DIAGNOSIS — D509 Iron deficiency anemia, unspecified: Secondary | ICD-10-CM | POA: Diagnosis not present

## 2018-06-15 DIAGNOSIS — R82998 Other abnormal findings in urine: Secondary | ICD-10-CM | POA: Diagnosis not present

## 2018-06-15 DIAGNOSIS — N2589 Other disorders resulting from impaired renal tubular function: Secondary | ICD-10-CM | POA: Diagnosis not present

## 2018-06-15 DIAGNOSIS — N186 End stage renal disease: Secondary | ICD-10-CM | POA: Diagnosis not present

## 2018-06-15 DIAGNOSIS — N2581 Secondary hyperparathyroidism of renal origin: Secondary | ICD-10-CM | POA: Diagnosis not present

## 2018-06-15 DIAGNOSIS — D631 Anemia in chronic kidney disease: Secondary | ICD-10-CM | POA: Diagnosis not present

## 2018-06-16 DIAGNOSIS — Z4932 Encounter for adequacy testing for peritoneal dialysis: Secondary | ICD-10-CM | POA: Diagnosis not present

## 2018-06-16 DIAGNOSIS — D509 Iron deficiency anemia, unspecified: Secondary | ICD-10-CM | POA: Diagnosis not present

## 2018-06-16 DIAGNOSIS — N186 End stage renal disease: Secondary | ICD-10-CM | POA: Diagnosis not present

## 2018-06-16 DIAGNOSIS — D631 Anemia in chronic kidney disease: Secondary | ICD-10-CM | POA: Diagnosis not present

## 2018-06-16 DIAGNOSIS — E119 Type 2 diabetes mellitus without complications: Secondary | ICD-10-CM | POA: Diagnosis not present

## 2018-06-16 DIAGNOSIS — E440 Moderate protein-calorie malnutrition: Secondary | ICD-10-CM | POA: Diagnosis not present

## 2018-06-16 DIAGNOSIS — R82998 Other abnormal findings in urine: Secondary | ICD-10-CM | POA: Diagnosis not present

## 2018-06-16 DIAGNOSIS — N2589 Other disorders resulting from impaired renal tubular function: Secondary | ICD-10-CM | POA: Diagnosis not present

## 2018-06-16 DIAGNOSIS — E7849 Other hyperlipidemia: Secondary | ICD-10-CM | POA: Diagnosis not present

## 2018-06-16 DIAGNOSIS — N2581 Secondary hyperparathyroidism of renal origin: Secondary | ICD-10-CM | POA: Diagnosis not present

## 2018-06-17 DIAGNOSIS — E7849 Other hyperlipidemia: Secondary | ICD-10-CM | POA: Diagnosis not present

## 2018-06-17 DIAGNOSIS — E440 Moderate protein-calorie malnutrition: Secondary | ICD-10-CM | POA: Diagnosis not present

## 2018-06-17 DIAGNOSIS — N2581 Secondary hyperparathyroidism of renal origin: Secondary | ICD-10-CM | POA: Diagnosis not present

## 2018-06-17 DIAGNOSIS — Z4932 Encounter for adequacy testing for peritoneal dialysis: Secondary | ICD-10-CM | POA: Diagnosis not present

## 2018-06-17 DIAGNOSIS — D631 Anemia in chronic kidney disease: Secondary | ICD-10-CM | POA: Diagnosis not present

## 2018-06-17 DIAGNOSIS — R82998 Other abnormal findings in urine: Secondary | ICD-10-CM | POA: Diagnosis not present

## 2018-06-17 DIAGNOSIS — N2589 Other disorders resulting from impaired renal tubular function: Secondary | ICD-10-CM | POA: Diagnosis not present

## 2018-06-17 DIAGNOSIS — D509 Iron deficiency anemia, unspecified: Secondary | ICD-10-CM | POA: Diagnosis not present

## 2018-06-17 DIAGNOSIS — E119 Type 2 diabetes mellitus without complications: Secondary | ICD-10-CM | POA: Diagnosis not present

## 2018-06-17 DIAGNOSIS — N186 End stage renal disease: Secondary | ICD-10-CM | POA: Diagnosis not present

## 2018-06-18 DIAGNOSIS — D631 Anemia in chronic kidney disease: Secondary | ICD-10-CM | POA: Diagnosis not present

## 2018-06-18 DIAGNOSIS — N186 End stage renal disease: Secondary | ICD-10-CM | POA: Diagnosis not present

## 2018-06-18 DIAGNOSIS — E119 Type 2 diabetes mellitus without complications: Secondary | ICD-10-CM | POA: Diagnosis not present

## 2018-06-18 DIAGNOSIS — D509 Iron deficiency anemia, unspecified: Secondary | ICD-10-CM | POA: Diagnosis not present

## 2018-06-18 DIAGNOSIS — E440 Moderate protein-calorie malnutrition: Secondary | ICD-10-CM | POA: Diagnosis not present

## 2018-06-18 DIAGNOSIS — Z4932 Encounter for adequacy testing for peritoneal dialysis: Secondary | ICD-10-CM | POA: Diagnosis not present

## 2018-06-18 DIAGNOSIS — E7849 Other hyperlipidemia: Secondary | ICD-10-CM | POA: Diagnosis not present

## 2018-06-18 DIAGNOSIS — N2589 Other disorders resulting from impaired renal tubular function: Secondary | ICD-10-CM | POA: Diagnosis not present

## 2018-06-18 DIAGNOSIS — R82998 Other abnormal findings in urine: Secondary | ICD-10-CM | POA: Diagnosis not present

## 2018-06-18 DIAGNOSIS — N2581 Secondary hyperparathyroidism of renal origin: Secondary | ICD-10-CM | POA: Diagnosis not present

## 2018-06-19 DIAGNOSIS — D631 Anemia in chronic kidney disease: Secondary | ICD-10-CM | POA: Diagnosis not present

## 2018-06-19 DIAGNOSIS — Z4932 Encounter for adequacy testing for peritoneal dialysis: Secondary | ICD-10-CM | POA: Diagnosis not present

## 2018-06-19 DIAGNOSIS — E1122 Type 2 diabetes mellitus with diabetic chronic kidney disease: Secondary | ICD-10-CM | POA: Diagnosis not present

## 2018-06-19 DIAGNOSIS — N2589 Other disorders resulting from impaired renal tubular function: Secondary | ICD-10-CM | POA: Diagnosis not present

## 2018-06-19 DIAGNOSIS — D509 Iron deficiency anemia, unspecified: Secondary | ICD-10-CM | POA: Diagnosis not present

## 2018-06-19 DIAGNOSIS — E440 Moderate protein-calorie malnutrition: Secondary | ICD-10-CM | POA: Diagnosis not present

## 2018-06-19 DIAGNOSIS — Z992 Dependence on renal dialysis: Secondary | ICD-10-CM | POA: Diagnosis not present

## 2018-06-19 DIAGNOSIS — N186 End stage renal disease: Secondary | ICD-10-CM | POA: Diagnosis not present

## 2018-06-19 DIAGNOSIS — E7849 Other hyperlipidemia: Secondary | ICD-10-CM | POA: Diagnosis not present

## 2018-06-19 DIAGNOSIS — N2581 Secondary hyperparathyroidism of renal origin: Secondary | ICD-10-CM | POA: Diagnosis not present

## 2018-06-19 DIAGNOSIS — R82998 Other abnormal findings in urine: Secondary | ICD-10-CM | POA: Diagnosis not present

## 2018-06-19 DIAGNOSIS — E119 Type 2 diabetes mellitus without complications: Secondary | ICD-10-CM | POA: Diagnosis not present

## 2018-06-20 DIAGNOSIS — R82998 Other abnormal findings in urine: Secondary | ICD-10-CM | POA: Diagnosis not present

## 2018-06-20 DIAGNOSIS — E440 Moderate protein-calorie malnutrition: Secondary | ICD-10-CM | POA: Diagnosis not present

## 2018-06-20 DIAGNOSIS — N2581 Secondary hyperparathyroidism of renal origin: Secondary | ICD-10-CM | POA: Diagnosis not present

## 2018-06-20 DIAGNOSIS — N186 End stage renal disease: Secondary | ICD-10-CM | POA: Diagnosis not present

## 2018-06-20 DIAGNOSIS — R17 Unspecified jaundice: Secondary | ICD-10-CM | POA: Diagnosis not present

## 2018-06-20 DIAGNOSIS — D631 Anemia in chronic kidney disease: Secondary | ICD-10-CM | POA: Diagnosis not present

## 2018-06-20 DIAGNOSIS — Z79899 Other long term (current) drug therapy: Secondary | ICD-10-CM | POA: Diagnosis not present

## 2018-06-20 DIAGNOSIS — D509 Iron deficiency anemia, unspecified: Secondary | ICD-10-CM | POA: Diagnosis not present

## 2018-06-21 DIAGNOSIS — N186 End stage renal disease: Secondary | ICD-10-CM | POA: Diagnosis not present

## 2018-06-21 DIAGNOSIS — E440 Moderate protein-calorie malnutrition: Secondary | ICD-10-CM | POA: Diagnosis not present

## 2018-06-21 DIAGNOSIS — Z79899 Other long term (current) drug therapy: Secondary | ICD-10-CM | POA: Diagnosis not present

## 2018-06-21 DIAGNOSIS — N2581 Secondary hyperparathyroidism of renal origin: Secondary | ICD-10-CM | POA: Diagnosis not present

## 2018-06-21 DIAGNOSIS — R82998 Other abnormal findings in urine: Secondary | ICD-10-CM | POA: Diagnosis not present

## 2018-06-21 DIAGNOSIS — R17 Unspecified jaundice: Secondary | ICD-10-CM | POA: Diagnosis not present

## 2018-06-21 DIAGNOSIS — D509 Iron deficiency anemia, unspecified: Secondary | ICD-10-CM | POA: Diagnosis not present

## 2018-06-21 DIAGNOSIS — D631 Anemia in chronic kidney disease: Secondary | ICD-10-CM | POA: Diagnosis not present

## 2018-06-22 DIAGNOSIS — N2581 Secondary hyperparathyroidism of renal origin: Secondary | ICD-10-CM | POA: Diagnosis not present

## 2018-06-22 DIAGNOSIS — R82998 Other abnormal findings in urine: Secondary | ICD-10-CM | POA: Diagnosis not present

## 2018-06-22 DIAGNOSIS — E440 Moderate protein-calorie malnutrition: Secondary | ICD-10-CM | POA: Diagnosis not present

## 2018-06-22 DIAGNOSIS — D509 Iron deficiency anemia, unspecified: Secondary | ICD-10-CM | POA: Diagnosis not present

## 2018-06-22 DIAGNOSIS — D631 Anemia in chronic kidney disease: Secondary | ICD-10-CM | POA: Diagnosis not present

## 2018-06-22 DIAGNOSIS — Z79899 Other long term (current) drug therapy: Secondary | ICD-10-CM | POA: Diagnosis not present

## 2018-06-22 DIAGNOSIS — R17 Unspecified jaundice: Secondary | ICD-10-CM | POA: Diagnosis not present

## 2018-06-22 DIAGNOSIS — N186 End stage renal disease: Secondary | ICD-10-CM | POA: Diagnosis not present

## 2018-06-23 DIAGNOSIS — E440 Moderate protein-calorie malnutrition: Secondary | ICD-10-CM | POA: Diagnosis not present

## 2018-06-23 DIAGNOSIS — R17 Unspecified jaundice: Secondary | ICD-10-CM | POA: Diagnosis not present

## 2018-06-23 DIAGNOSIS — Z79899 Other long term (current) drug therapy: Secondary | ICD-10-CM | POA: Diagnosis not present

## 2018-06-23 DIAGNOSIS — D631 Anemia in chronic kidney disease: Secondary | ICD-10-CM | POA: Diagnosis not present

## 2018-06-23 DIAGNOSIS — R82998 Other abnormal findings in urine: Secondary | ICD-10-CM | POA: Diagnosis not present

## 2018-06-23 DIAGNOSIS — D509 Iron deficiency anemia, unspecified: Secondary | ICD-10-CM | POA: Diagnosis not present

## 2018-06-23 DIAGNOSIS — N186 End stage renal disease: Secondary | ICD-10-CM | POA: Diagnosis not present

## 2018-06-23 DIAGNOSIS — N2581 Secondary hyperparathyroidism of renal origin: Secondary | ICD-10-CM | POA: Diagnosis not present

## 2018-06-24 DIAGNOSIS — R82998 Other abnormal findings in urine: Secondary | ICD-10-CM | POA: Diagnosis not present

## 2018-06-24 DIAGNOSIS — D631 Anemia in chronic kidney disease: Secondary | ICD-10-CM | POA: Diagnosis not present

## 2018-06-24 DIAGNOSIS — R17 Unspecified jaundice: Secondary | ICD-10-CM | POA: Diagnosis not present

## 2018-06-24 DIAGNOSIS — N186 End stage renal disease: Secondary | ICD-10-CM | POA: Diagnosis not present

## 2018-06-24 DIAGNOSIS — N2581 Secondary hyperparathyroidism of renal origin: Secondary | ICD-10-CM | POA: Diagnosis not present

## 2018-06-24 DIAGNOSIS — E440 Moderate protein-calorie malnutrition: Secondary | ICD-10-CM | POA: Diagnosis not present

## 2018-06-24 DIAGNOSIS — Z79899 Other long term (current) drug therapy: Secondary | ICD-10-CM | POA: Diagnosis not present

## 2018-06-24 DIAGNOSIS — D509 Iron deficiency anemia, unspecified: Secondary | ICD-10-CM | POA: Diagnosis not present

## 2018-06-25 DIAGNOSIS — D631 Anemia in chronic kidney disease: Secondary | ICD-10-CM | POA: Diagnosis not present

## 2018-06-25 DIAGNOSIS — R82998 Other abnormal findings in urine: Secondary | ICD-10-CM | POA: Diagnosis not present

## 2018-06-25 DIAGNOSIS — R17 Unspecified jaundice: Secondary | ICD-10-CM | POA: Diagnosis not present

## 2018-06-25 DIAGNOSIS — D509 Iron deficiency anemia, unspecified: Secondary | ICD-10-CM | POA: Diagnosis not present

## 2018-06-25 DIAGNOSIS — N186 End stage renal disease: Secondary | ICD-10-CM | POA: Diagnosis not present

## 2018-06-25 DIAGNOSIS — E440 Moderate protein-calorie malnutrition: Secondary | ICD-10-CM | POA: Diagnosis not present

## 2018-06-25 DIAGNOSIS — N2581 Secondary hyperparathyroidism of renal origin: Secondary | ICD-10-CM | POA: Diagnosis not present

## 2018-06-25 DIAGNOSIS — Z79899 Other long term (current) drug therapy: Secondary | ICD-10-CM | POA: Diagnosis not present

## 2018-06-26 DIAGNOSIS — R17 Unspecified jaundice: Secondary | ICD-10-CM | POA: Diagnosis not present

## 2018-06-26 DIAGNOSIS — D631 Anemia in chronic kidney disease: Secondary | ICD-10-CM | POA: Diagnosis not present

## 2018-06-26 DIAGNOSIS — Z79899 Other long term (current) drug therapy: Secondary | ICD-10-CM | POA: Diagnosis not present

## 2018-06-26 DIAGNOSIS — N186 End stage renal disease: Secondary | ICD-10-CM | POA: Diagnosis not present

## 2018-06-26 DIAGNOSIS — D509 Iron deficiency anemia, unspecified: Secondary | ICD-10-CM | POA: Diagnosis not present

## 2018-06-26 DIAGNOSIS — N2581 Secondary hyperparathyroidism of renal origin: Secondary | ICD-10-CM | POA: Diagnosis not present

## 2018-06-26 DIAGNOSIS — R82998 Other abnormal findings in urine: Secondary | ICD-10-CM | POA: Diagnosis not present

## 2018-06-26 DIAGNOSIS — E440 Moderate protein-calorie malnutrition: Secondary | ICD-10-CM | POA: Diagnosis not present

## 2018-06-27 DIAGNOSIS — R17 Unspecified jaundice: Secondary | ICD-10-CM | POA: Diagnosis not present

## 2018-06-27 DIAGNOSIS — E440 Moderate protein-calorie malnutrition: Secondary | ICD-10-CM | POA: Diagnosis not present

## 2018-06-27 DIAGNOSIS — D509 Iron deficiency anemia, unspecified: Secondary | ICD-10-CM | POA: Diagnosis not present

## 2018-06-27 DIAGNOSIS — R82998 Other abnormal findings in urine: Secondary | ICD-10-CM | POA: Diagnosis not present

## 2018-06-27 DIAGNOSIS — D631 Anemia in chronic kidney disease: Secondary | ICD-10-CM | POA: Diagnosis not present

## 2018-06-27 DIAGNOSIS — N186 End stage renal disease: Secondary | ICD-10-CM | POA: Diagnosis not present

## 2018-06-27 DIAGNOSIS — N2581 Secondary hyperparathyroidism of renal origin: Secondary | ICD-10-CM | POA: Diagnosis not present

## 2018-06-27 DIAGNOSIS — Z79899 Other long term (current) drug therapy: Secondary | ICD-10-CM | POA: Diagnosis not present

## 2018-06-28 DIAGNOSIS — D509 Iron deficiency anemia, unspecified: Secondary | ICD-10-CM | POA: Diagnosis not present

## 2018-06-28 DIAGNOSIS — N186 End stage renal disease: Secondary | ICD-10-CM | POA: Diagnosis not present

## 2018-06-28 DIAGNOSIS — N2581 Secondary hyperparathyroidism of renal origin: Secondary | ICD-10-CM | POA: Diagnosis not present

## 2018-06-28 DIAGNOSIS — Z79899 Other long term (current) drug therapy: Secondary | ICD-10-CM | POA: Diagnosis not present

## 2018-06-28 DIAGNOSIS — E440 Moderate protein-calorie malnutrition: Secondary | ICD-10-CM | POA: Diagnosis not present

## 2018-06-28 DIAGNOSIS — R82998 Other abnormal findings in urine: Secondary | ICD-10-CM | POA: Diagnosis not present

## 2018-06-28 DIAGNOSIS — D631 Anemia in chronic kidney disease: Secondary | ICD-10-CM | POA: Diagnosis not present

## 2018-06-28 DIAGNOSIS — R17 Unspecified jaundice: Secondary | ICD-10-CM | POA: Diagnosis not present

## 2018-06-29 DIAGNOSIS — R82998 Other abnormal findings in urine: Secondary | ICD-10-CM | POA: Diagnosis not present

## 2018-06-29 DIAGNOSIS — D509 Iron deficiency anemia, unspecified: Secondary | ICD-10-CM | POA: Diagnosis not present

## 2018-06-29 DIAGNOSIS — N2581 Secondary hyperparathyroidism of renal origin: Secondary | ICD-10-CM | POA: Diagnosis not present

## 2018-06-29 DIAGNOSIS — N186 End stage renal disease: Secondary | ICD-10-CM | POA: Diagnosis not present

## 2018-06-29 DIAGNOSIS — E440 Moderate protein-calorie malnutrition: Secondary | ICD-10-CM | POA: Diagnosis not present

## 2018-06-29 DIAGNOSIS — D631 Anemia in chronic kidney disease: Secondary | ICD-10-CM | POA: Diagnosis not present

## 2018-06-29 DIAGNOSIS — Z79899 Other long term (current) drug therapy: Secondary | ICD-10-CM | POA: Diagnosis not present

## 2018-06-29 DIAGNOSIS — R17 Unspecified jaundice: Secondary | ICD-10-CM | POA: Diagnosis not present

## 2018-06-30 DIAGNOSIS — R82998 Other abnormal findings in urine: Secondary | ICD-10-CM | POA: Diagnosis not present

## 2018-06-30 DIAGNOSIS — D631 Anemia in chronic kidney disease: Secondary | ICD-10-CM | POA: Diagnosis not present

## 2018-06-30 DIAGNOSIS — E440 Moderate protein-calorie malnutrition: Secondary | ICD-10-CM | POA: Diagnosis not present

## 2018-06-30 DIAGNOSIS — D509 Iron deficiency anemia, unspecified: Secondary | ICD-10-CM | POA: Diagnosis not present

## 2018-06-30 DIAGNOSIS — Z79899 Other long term (current) drug therapy: Secondary | ICD-10-CM | POA: Diagnosis not present

## 2018-06-30 DIAGNOSIS — R17 Unspecified jaundice: Secondary | ICD-10-CM | POA: Diagnosis not present

## 2018-06-30 DIAGNOSIS — N186 End stage renal disease: Secondary | ICD-10-CM | POA: Diagnosis not present

## 2018-06-30 DIAGNOSIS — N2581 Secondary hyperparathyroidism of renal origin: Secondary | ICD-10-CM | POA: Diagnosis not present

## 2018-07-01 DIAGNOSIS — R17 Unspecified jaundice: Secondary | ICD-10-CM | POA: Diagnosis not present

## 2018-07-01 DIAGNOSIS — R82998 Other abnormal findings in urine: Secondary | ICD-10-CM | POA: Diagnosis not present

## 2018-07-01 DIAGNOSIS — N186 End stage renal disease: Secondary | ICD-10-CM | POA: Diagnosis not present

## 2018-07-01 DIAGNOSIS — Z79899 Other long term (current) drug therapy: Secondary | ICD-10-CM | POA: Diagnosis not present

## 2018-07-01 DIAGNOSIS — N2581 Secondary hyperparathyroidism of renal origin: Secondary | ICD-10-CM | POA: Diagnosis not present

## 2018-07-01 DIAGNOSIS — D631 Anemia in chronic kidney disease: Secondary | ICD-10-CM | POA: Diagnosis not present

## 2018-07-01 DIAGNOSIS — E440 Moderate protein-calorie malnutrition: Secondary | ICD-10-CM | POA: Diagnosis not present

## 2018-07-01 DIAGNOSIS — D509 Iron deficiency anemia, unspecified: Secondary | ICD-10-CM | POA: Diagnosis not present

## 2018-07-02 DIAGNOSIS — D509 Iron deficiency anemia, unspecified: Secondary | ICD-10-CM | POA: Diagnosis not present

## 2018-07-02 DIAGNOSIS — D631 Anemia in chronic kidney disease: Secondary | ICD-10-CM | POA: Diagnosis not present

## 2018-07-02 DIAGNOSIS — R17 Unspecified jaundice: Secondary | ICD-10-CM | POA: Diagnosis not present

## 2018-07-02 DIAGNOSIS — R82998 Other abnormal findings in urine: Secondary | ICD-10-CM | POA: Diagnosis not present

## 2018-07-02 DIAGNOSIS — N186 End stage renal disease: Secondary | ICD-10-CM | POA: Diagnosis not present

## 2018-07-02 DIAGNOSIS — E440 Moderate protein-calorie malnutrition: Secondary | ICD-10-CM | POA: Diagnosis not present

## 2018-07-02 DIAGNOSIS — Z79899 Other long term (current) drug therapy: Secondary | ICD-10-CM | POA: Diagnosis not present

## 2018-07-02 DIAGNOSIS — N2581 Secondary hyperparathyroidism of renal origin: Secondary | ICD-10-CM | POA: Diagnosis not present

## 2018-07-03 DIAGNOSIS — R82998 Other abnormal findings in urine: Secondary | ICD-10-CM | POA: Diagnosis not present

## 2018-07-03 DIAGNOSIS — N2581 Secondary hyperparathyroidism of renal origin: Secondary | ICD-10-CM | POA: Diagnosis not present

## 2018-07-03 DIAGNOSIS — E440 Moderate protein-calorie malnutrition: Secondary | ICD-10-CM | POA: Diagnosis not present

## 2018-07-03 DIAGNOSIS — D631 Anemia in chronic kidney disease: Secondary | ICD-10-CM | POA: Diagnosis not present

## 2018-07-03 DIAGNOSIS — R17 Unspecified jaundice: Secondary | ICD-10-CM | POA: Diagnosis not present

## 2018-07-03 DIAGNOSIS — Z79899 Other long term (current) drug therapy: Secondary | ICD-10-CM | POA: Diagnosis not present

## 2018-07-03 DIAGNOSIS — D509 Iron deficiency anemia, unspecified: Secondary | ICD-10-CM | POA: Diagnosis not present

## 2018-07-03 DIAGNOSIS — N186 End stage renal disease: Secondary | ICD-10-CM | POA: Diagnosis not present

## 2018-07-04 DIAGNOSIS — R82998 Other abnormal findings in urine: Secondary | ICD-10-CM | POA: Diagnosis not present

## 2018-07-04 DIAGNOSIS — Z79899 Other long term (current) drug therapy: Secondary | ICD-10-CM | POA: Diagnosis not present

## 2018-07-04 DIAGNOSIS — D509 Iron deficiency anemia, unspecified: Secondary | ICD-10-CM | POA: Diagnosis not present

## 2018-07-04 DIAGNOSIS — N186 End stage renal disease: Secondary | ICD-10-CM | POA: Diagnosis not present

## 2018-07-04 DIAGNOSIS — R17 Unspecified jaundice: Secondary | ICD-10-CM | POA: Diagnosis not present

## 2018-07-04 DIAGNOSIS — N2581 Secondary hyperparathyroidism of renal origin: Secondary | ICD-10-CM | POA: Diagnosis not present

## 2018-07-04 DIAGNOSIS — D631 Anemia in chronic kidney disease: Secondary | ICD-10-CM | POA: Diagnosis not present

## 2018-07-04 DIAGNOSIS — E440 Moderate protein-calorie malnutrition: Secondary | ICD-10-CM | POA: Diagnosis not present

## 2018-07-05 DIAGNOSIS — D631 Anemia in chronic kidney disease: Secondary | ICD-10-CM | POA: Diagnosis not present

## 2018-07-05 DIAGNOSIS — N186 End stage renal disease: Secondary | ICD-10-CM | POA: Diagnosis not present

## 2018-07-05 DIAGNOSIS — R82998 Other abnormal findings in urine: Secondary | ICD-10-CM | POA: Diagnosis not present

## 2018-07-05 DIAGNOSIS — D509 Iron deficiency anemia, unspecified: Secondary | ICD-10-CM | POA: Diagnosis not present

## 2018-07-05 DIAGNOSIS — E440 Moderate protein-calorie malnutrition: Secondary | ICD-10-CM | POA: Diagnosis not present

## 2018-07-05 DIAGNOSIS — Z79899 Other long term (current) drug therapy: Secondary | ICD-10-CM | POA: Diagnosis not present

## 2018-07-05 DIAGNOSIS — N2581 Secondary hyperparathyroidism of renal origin: Secondary | ICD-10-CM | POA: Diagnosis not present

## 2018-07-05 DIAGNOSIS — R17 Unspecified jaundice: Secondary | ICD-10-CM | POA: Diagnosis not present

## 2018-07-06 DIAGNOSIS — R82998 Other abnormal findings in urine: Secondary | ICD-10-CM | POA: Diagnosis not present

## 2018-07-06 DIAGNOSIS — E440 Moderate protein-calorie malnutrition: Secondary | ICD-10-CM | POA: Diagnosis not present

## 2018-07-06 DIAGNOSIS — N186 End stage renal disease: Secondary | ICD-10-CM | POA: Diagnosis not present

## 2018-07-06 DIAGNOSIS — D631 Anemia in chronic kidney disease: Secondary | ICD-10-CM | POA: Diagnosis not present

## 2018-07-06 DIAGNOSIS — Z79899 Other long term (current) drug therapy: Secondary | ICD-10-CM | POA: Diagnosis not present

## 2018-07-06 DIAGNOSIS — N2581 Secondary hyperparathyroidism of renal origin: Secondary | ICD-10-CM | POA: Diagnosis not present

## 2018-07-06 DIAGNOSIS — D509 Iron deficiency anemia, unspecified: Secondary | ICD-10-CM | POA: Diagnosis not present

## 2018-07-06 DIAGNOSIS — R17 Unspecified jaundice: Secondary | ICD-10-CM | POA: Diagnosis not present

## 2018-07-07 DIAGNOSIS — N186 End stage renal disease: Secondary | ICD-10-CM | POA: Diagnosis not present

## 2018-07-07 DIAGNOSIS — R17 Unspecified jaundice: Secondary | ICD-10-CM | POA: Diagnosis not present

## 2018-07-07 DIAGNOSIS — D631 Anemia in chronic kidney disease: Secondary | ICD-10-CM | POA: Diagnosis not present

## 2018-07-07 DIAGNOSIS — D509 Iron deficiency anemia, unspecified: Secondary | ICD-10-CM | POA: Diagnosis not present

## 2018-07-07 DIAGNOSIS — N2581 Secondary hyperparathyroidism of renal origin: Secondary | ICD-10-CM | POA: Diagnosis not present

## 2018-07-07 DIAGNOSIS — R82998 Other abnormal findings in urine: Secondary | ICD-10-CM | POA: Diagnosis not present

## 2018-07-07 DIAGNOSIS — E440 Moderate protein-calorie malnutrition: Secondary | ICD-10-CM | POA: Diagnosis not present

## 2018-07-07 DIAGNOSIS — Z79899 Other long term (current) drug therapy: Secondary | ICD-10-CM | POA: Diagnosis not present

## 2018-07-08 DIAGNOSIS — N186 End stage renal disease: Secondary | ICD-10-CM | POA: Diagnosis not present

## 2018-07-08 DIAGNOSIS — E440 Moderate protein-calorie malnutrition: Secondary | ICD-10-CM | POA: Diagnosis not present

## 2018-07-08 DIAGNOSIS — D509 Iron deficiency anemia, unspecified: Secondary | ICD-10-CM | POA: Diagnosis not present

## 2018-07-08 DIAGNOSIS — D631 Anemia in chronic kidney disease: Secondary | ICD-10-CM | POA: Diagnosis not present

## 2018-07-08 DIAGNOSIS — R82998 Other abnormal findings in urine: Secondary | ICD-10-CM | POA: Diagnosis not present

## 2018-07-08 DIAGNOSIS — R17 Unspecified jaundice: Secondary | ICD-10-CM | POA: Diagnosis not present

## 2018-07-08 DIAGNOSIS — N2581 Secondary hyperparathyroidism of renal origin: Secondary | ICD-10-CM | POA: Diagnosis not present

## 2018-07-08 DIAGNOSIS — Z79899 Other long term (current) drug therapy: Secondary | ICD-10-CM | POA: Diagnosis not present

## 2018-07-09 DIAGNOSIS — E440 Moderate protein-calorie malnutrition: Secondary | ICD-10-CM | POA: Diagnosis not present

## 2018-07-09 DIAGNOSIS — D509 Iron deficiency anemia, unspecified: Secondary | ICD-10-CM | POA: Diagnosis not present

## 2018-07-09 DIAGNOSIS — R17 Unspecified jaundice: Secondary | ICD-10-CM | POA: Diagnosis not present

## 2018-07-09 DIAGNOSIS — Z79899 Other long term (current) drug therapy: Secondary | ICD-10-CM | POA: Diagnosis not present

## 2018-07-09 DIAGNOSIS — N186 End stage renal disease: Secondary | ICD-10-CM | POA: Diagnosis not present

## 2018-07-09 DIAGNOSIS — D631 Anemia in chronic kidney disease: Secondary | ICD-10-CM | POA: Diagnosis not present

## 2018-07-09 DIAGNOSIS — N2581 Secondary hyperparathyroidism of renal origin: Secondary | ICD-10-CM | POA: Diagnosis not present

## 2018-07-09 DIAGNOSIS — R82998 Other abnormal findings in urine: Secondary | ICD-10-CM | POA: Diagnosis not present

## 2018-07-10 DIAGNOSIS — N186 End stage renal disease: Secondary | ICD-10-CM | POA: Diagnosis not present

## 2018-07-10 DIAGNOSIS — R17 Unspecified jaundice: Secondary | ICD-10-CM | POA: Diagnosis not present

## 2018-07-10 DIAGNOSIS — N2581 Secondary hyperparathyroidism of renal origin: Secondary | ICD-10-CM | POA: Diagnosis not present

## 2018-07-10 DIAGNOSIS — D631 Anemia in chronic kidney disease: Secondary | ICD-10-CM | POA: Diagnosis not present

## 2018-07-10 DIAGNOSIS — R82998 Other abnormal findings in urine: Secondary | ICD-10-CM | POA: Diagnosis not present

## 2018-07-10 DIAGNOSIS — D509 Iron deficiency anemia, unspecified: Secondary | ICD-10-CM | POA: Diagnosis not present

## 2018-07-10 DIAGNOSIS — E440 Moderate protein-calorie malnutrition: Secondary | ICD-10-CM | POA: Diagnosis not present

## 2018-07-10 DIAGNOSIS — Z79899 Other long term (current) drug therapy: Secondary | ICD-10-CM | POA: Diagnosis not present

## 2018-07-11 DIAGNOSIS — N2581 Secondary hyperparathyroidism of renal origin: Secondary | ICD-10-CM | POA: Diagnosis not present

## 2018-07-11 DIAGNOSIS — D631 Anemia in chronic kidney disease: Secondary | ICD-10-CM | POA: Diagnosis not present

## 2018-07-11 DIAGNOSIS — N186 End stage renal disease: Secondary | ICD-10-CM | POA: Diagnosis not present

## 2018-07-11 DIAGNOSIS — E440 Moderate protein-calorie malnutrition: Secondary | ICD-10-CM | POA: Diagnosis not present

## 2018-07-11 DIAGNOSIS — R17 Unspecified jaundice: Secondary | ICD-10-CM | POA: Diagnosis not present

## 2018-07-11 DIAGNOSIS — R82998 Other abnormal findings in urine: Secondary | ICD-10-CM | POA: Diagnosis not present

## 2018-07-11 DIAGNOSIS — D509 Iron deficiency anemia, unspecified: Secondary | ICD-10-CM | POA: Diagnosis not present

## 2018-07-11 DIAGNOSIS — Z79899 Other long term (current) drug therapy: Secondary | ICD-10-CM | POA: Diagnosis not present

## 2018-07-12 DIAGNOSIS — R17 Unspecified jaundice: Secondary | ICD-10-CM | POA: Diagnosis not present

## 2018-07-12 DIAGNOSIS — N186 End stage renal disease: Secondary | ICD-10-CM | POA: Diagnosis not present

## 2018-07-12 DIAGNOSIS — Z79899 Other long term (current) drug therapy: Secondary | ICD-10-CM | POA: Diagnosis not present

## 2018-07-12 DIAGNOSIS — D509 Iron deficiency anemia, unspecified: Secondary | ICD-10-CM | POA: Diagnosis not present

## 2018-07-12 DIAGNOSIS — R82998 Other abnormal findings in urine: Secondary | ICD-10-CM | POA: Diagnosis not present

## 2018-07-12 DIAGNOSIS — N2581 Secondary hyperparathyroidism of renal origin: Secondary | ICD-10-CM | POA: Diagnosis not present

## 2018-07-12 DIAGNOSIS — D631 Anemia in chronic kidney disease: Secondary | ICD-10-CM | POA: Diagnosis not present

## 2018-07-12 DIAGNOSIS — E440 Moderate protein-calorie malnutrition: Secondary | ICD-10-CM | POA: Diagnosis not present

## 2018-07-13 DIAGNOSIS — R82998 Other abnormal findings in urine: Secondary | ICD-10-CM | POA: Diagnosis not present

## 2018-07-13 DIAGNOSIS — R17 Unspecified jaundice: Secondary | ICD-10-CM | POA: Diagnosis not present

## 2018-07-13 DIAGNOSIS — D509 Iron deficiency anemia, unspecified: Secondary | ICD-10-CM | POA: Diagnosis not present

## 2018-07-13 DIAGNOSIS — Z79899 Other long term (current) drug therapy: Secondary | ICD-10-CM | POA: Diagnosis not present

## 2018-07-13 DIAGNOSIS — E440 Moderate protein-calorie malnutrition: Secondary | ICD-10-CM | POA: Diagnosis not present

## 2018-07-13 DIAGNOSIS — N2581 Secondary hyperparathyroidism of renal origin: Secondary | ICD-10-CM | POA: Diagnosis not present

## 2018-07-13 DIAGNOSIS — D631 Anemia in chronic kidney disease: Secondary | ICD-10-CM | POA: Diagnosis not present

## 2018-07-13 DIAGNOSIS — N186 End stage renal disease: Secondary | ICD-10-CM | POA: Diagnosis not present

## 2018-07-14 DIAGNOSIS — N2581 Secondary hyperparathyroidism of renal origin: Secondary | ICD-10-CM | POA: Diagnosis not present

## 2018-07-14 DIAGNOSIS — Z79899 Other long term (current) drug therapy: Secondary | ICD-10-CM | POA: Diagnosis not present

## 2018-07-14 DIAGNOSIS — E440 Moderate protein-calorie malnutrition: Secondary | ICD-10-CM | POA: Diagnosis not present

## 2018-07-14 DIAGNOSIS — R17 Unspecified jaundice: Secondary | ICD-10-CM | POA: Diagnosis not present

## 2018-07-14 DIAGNOSIS — D509 Iron deficiency anemia, unspecified: Secondary | ICD-10-CM | POA: Diagnosis not present

## 2018-07-14 DIAGNOSIS — N186 End stage renal disease: Secondary | ICD-10-CM | POA: Diagnosis not present

## 2018-07-14 DIAGNOSIS — D631 Anemia in chronic kidney disease: Secondary | ICD-10-CM | POA: Diagnosis not present

## 2018-07-14 DIAGNOSIS — R82998 Other abnormal findings in urine: Secondary | ICD-10-CM | POA: Diagnosis not present

## 2018-07-15 DIAGNOSIS — D631 Anemia in chronic kidney disease: Secondary | ICD-10-CM | POA: Diagnosis not present

## 2018-07-15 DIAGNOSIS — R82998 Other abnormal findings in urine: Secondary | ICD-10-CM | POA: Diagnosis not present

## 2018-07-15 DIAGNOSIS — R17 Unspecified jaundice: Secondary | ICD-10-CM | POA: Diagnosis not present

## 2018-07-15 DIAGNOSIS — E440 Moderate protein-calorie malnutrition: Secondary | ICD-10-CM | POA: Diagnosis not present

## 2018-07-15 DIAGNOSIS — Z79899 Other long term (current) drug therapy: Secondary | ICD-10-CM | POA: Diagnosis not present

## 2018-07-15 DIAGNOSIS — N2581 Secondary hyperparathyroidism of renal origin: Secondary | ICD-10-CM | POA: Diagnosis not present

## 2018-07-15 DIAGNOSIS — N186 End stage renal disease: Secondary | ICD-10-CM | POA: Diagnosis not present

## 2018-07-15 DIAGNOSIS — D509 Iron deficiency anemia, unspecified: Secondary | ICD-10-CM | POA: Diagnosis not present

## 2018-07-16 DIAGNOSIS — D631 Anemia in chronic kidney disease: Secondary | ICD-10-CM | POA: Diagnosis not present

## 2018-07-16 DIAGNOSIS — D509 Iron deficiency anemia, unspecified: Secondary | ICD-10-CM | POA: Diagnosis not present

## 2018-07-16 DIAGNOSIS — R82998 Other abnormal findings in urine: Secondary | ICD-10-CM | POA: Diagnosis not present

## 2018-07-16 DIAGNOSIS — N186 End stage renal disease: Secondary | ICD-10-CM | POA: Diagnosis not present

## 2018-07-16 DIAGNOSIS — E440 Moderate protein-calorie malnutrition: Secondary | ICD-10-CM | POA: Diagnosis not present

## 2018-07-16 DIAGNOSIS — Z79899 Other long term (current) drug therapy: Secondary | ICD-10-CM | POA: Diagnosis not present

## 2018-07-16 DIAGNOSIS — R17 Unspecified jaundice: Secondary | ICD-10-CM | POA: Diagnosis not present

## 2018-07-16 DIAGNOSIS — N2581 Secondary hyperparathyroidism of renal origin: Secondary | ICD-10-CM | POA: Diagnosis not present

## 2018-07-17 ENCOUNTER — Other Ambulatory Visit: Payer: Self-pay | Admitting: Internal Medicine

## 2018-07-17 DIAGNOSIS — N2581 Secondary hyperparathyroidism of renal origin: Secondary | ICD-10-CM | POA: Diagnosis not present

## 2018-07-17 DIAGNOSIS — N186 End stage renal disease: Secondary | ICD-10-CM | POA: Diagnosis not present

## 2018-07-17 DIAGNOSIS — D631 Anemia in chronic kidney disease: Secondary | ICD-10-CM | POA: Diagnosis not present

## 2018-07-17 DIAGNOSIS — Z79899 Other long term (current) drug therapy: Secondary | ICD-10-CM | POA: Diagnosis not present

## 2018-07-17 DIAGNOSIS — D509 Iron deficiency anemia, unspecified: Secondary | ICD-10-CM | POA: Diagnosis not present

## 2018-07-17 DIAGNOSIS — R82998 Other abnormal findings in urine: Secondary | ICD-10-CM | POA: Diagnosis not present

## 2018-07-17 DIAGNOSIS — E440 Moderate protein-calorie malnutrition: Secondary | ICD-10-CM | POA: Diagnosis not present

## 2018-07-17 DIAGNOSIS — R17 Unspecified jaundice: Secondary | ICD-10-CM | POA: Diagnosis not present

## 2018-07-18 DIAGNOSIS — D631 Anemia in chronic kidney disease: Secondary | ICD-10-CM | POA: Diagnosis not present

## 2018-07-18 DIAGNOSIS — E440 Moderate protein-calorie malnutrition: Secondary | ICD-10-CM | POA: Diagnosis not present

## 2018-07-18 DIAGNOSIS — N186 End stage renal disease: Secondary | ICD-10-CM | POA: Diagnosis not present

## 2018-07-18 DIAGNOSIS — Z79899 Other long term (current) drug therapy: Secondary | ICD-10-CM | POA: Diagnosis not present

## 2018-07-18 DIAGNOSIS — R17 Unspecified jaundice: Secondary | ICD-10-CM | POA: Diagnosis not present

## 2018-07-18 DIAGNOSIS — D509 Iron deficiency anemia, unspecified: Secondary | ICD-10-CM | POA: Diagnosis not present

## 2018-07-18 DIAGNOSIS — R82998 Other abnormal findings in urine: Secondary | ICD-10-CM | POA: Diagnosis not present

## 2018-07-18 DIAGNOSIS — N2581 Secondary hyperparathyroidism of renal origin: Secondary | ICD-10-CM | POA: Diagnosis not present

## 2018-07-19 DIAGNOSIS — E440 Moderate protein-calorie malnutrition: Secondary | ICD-10-CM | POA: Diagnosis not present

## 2018-07-19 DIAGNOSIS — R82998 Other abnormal findings in urine: Secondary | ICD-10-CM | POA: Diagnosis not present

## 2018-07-19 DIAGNOSIS — R17 Unspecified jaundice: Secondary | ICD-10-CM | POA: Diagnosis not present

## 2018-07-19 DIAGNOSIS — N186 End stage renal disease: Secondary | ICD-10-CM | POA: Diagnosis not present

## 2018-07-19 DIAGNOSIS — Z79899 Other long term (current) drug therapy: Secondary | ICD-10-CM | POA: Diagnosis not present

## 2018-07-19 DIAGNOSIS — D509 Iron deficiency anemia, unspecified: Secondary | ICD-10-CM | POA: Diagnosis not present

## 2018-07-19 DIAGNOSIS — N2581 Secondary hyperparathyroidism of renal origin: Secondary | ICD-10-CM | POA: Diagnosis not present

## 2018-07-19 DIAGNOSIS — D631 Anemia in chronic kidney disease: Secondary | ICD-10-CM | POA: Diagnosis not present

## 2018-07-20 DIAGNOSIS — D509 Iron deficiency anemia, unspecified: Secondary | ICD-10-CM | POA: Diagnosis not present

## 2018-07-20 DIAGNOSIS — Z992 Dependence on renal dialysis: Secondary | ICD-10-CM | POA: Diagnosis not present

## 2018-07-20 DIAGNOSIS — Z79899 Other long term (current) drug therapy: Secondary | ICD-10-CM | POA: Diagnosis not present

## 2018-07-20 DIAGNOSIS — E1122 Type 2 diabetes mellitus with diabetic chronic kidney disease: Secondary | ICD-10-CM | POA: Diagnosis not present

## 2018-07-20 DIAGNOSIS — R17 Unspecified jaundice: Secondary | ICD-10-CM | POA: Diagnosis not present

## 2018-07-20 DIAGNOSIS — N2581 Secondary hyperparathyroidism of renal origin: Secondary | ICD-10-CM | POA: Diagnosis not present

## 2018-07-20 DIAGNOSIS — N186 End stage renal disease: Secondary | ICD-10-CM | POA: Diagnosis not present

## 2018-07-20 DIAGNOSIS — D631 Anemia in chronic kidney disease: Secondary | ICD-10-CM | POA: Diagnosis not present

## 2018-07-20 DIAGNOSIS — R82998 Other abnormal findings in urine: Secondary | ICD-10-CM | POA: Diagnosis not present

## 2018-07-20 DIAGNOSIS — E440 Moderate protein-calorie malnutrition: Secondary | ICD-10-CM | POA: Diagnosis not present

## 2018-07-21 DIAGNOSIS — E440 Moderate protein-calorie malnutrition: Secondary | ICD-10-CM | POA: Diagnosis not present

## 2018-07-21 DIAGNOSIS — N186 End stage renal disease: Secondary | ICD-10-CM | POA: Diagnosis not present

## 2018-07-21 DIAGNOSIS — D509 Iron deficiency anemia, unspecified: Secondary | ICD-10-CM | POA: Diagnosis not present

## 2018-07-21 DIAGNOSIS — Z23 Encounter for immunization: Secondary | ICD-10-CM | POA: Diagnosis not present

## 2018-07-21 DIAGNOSIS — N2581 Secondary hyperparathyroidism of renal origin: Secondary | ICD-10-CM | POA: Diagnosis not present

## 2018-07-21 DIAGNOSIS — D631 Anemia in chronic kidney disease: Secondary | ICD-10-CM | POA: Diagnosis not present

## 2018-07-21 DIAGNOSIS — R82998 Other abnormal findings in urine: Secondary | ICD-10-CM | POA: Diagnosis not present

## 2018-07-21 DIAGNOSIS — R17 Unspecified jaundice: Secondary | ICD-10-CM | POA: Diagnosis not present

## 2018-07-21 DIAGNOSIS — Z79899 Other long term (current) drug therapy: Secondary | ICD-10-CM | POA: Diagnosis not present

## 2018-07-22 DIAGNOSIS — R17 Unspecified jaundice: Secondary | ICD-10-CM | POA: Diagnosis not present

## 2018-07-22 DIAGNOSIS — Z23 Encounter for immunization: Secondary | ICD-10-CM | POA: Diagnosis not present

## 2018-07-22 DIAGNOSIS — N2581 Secondary hyperparathyroidism of renal origin: Secondary | ICD-10-CM | POA: Diagnosis not present

## 2018-07-22 DIAGNOSIS — D509 Iron deficiency anemia, unspecified: Secondary | ICD-10-CM | POA: Diagnosis not present

## 2018-07-22 DIAGNOSIS — D631 Anemia in chronic kidney disease: Secondary | ICD-10-CM | POA: Diagnosis not present

## 2018-07-22 DIAGNOSIS — E440 Moderate protein-calorie malnutrition: Secondary | ICD-10-CM | POA: Diagnosis not present

## 2018-07-22 DIAGNOSIS — Z79899 Other long term (current) drug therapy: Secondary | ICD-10-CM | POA: Diagnosis not present

## 2018-07-22 DIAGNOSIS — N186 End stage renal disease: Secondary | ICD-10-CM | POA: Diagnosis not present

## 2018-07-22 DIAGNOSIS — R82998 Other abnormal findings in urine: Secondary | ICD-10-CM | POA: Diagnosis not present

## 2018-07-23 DIAGNOSIS — D631 Anemia in chronic kidney disease: Secondary | ICD-10-CM | POA: Diagnosis not present

## 2018-07-23 DIAGNOSIS — R82998 Other abnormal findings in urine: Secondary | ICD-10-CM | POA: Diagnosis not present

## 2018-07-23 DIAGNOSIS — Z79899 Other long term (current) drug therapy: Secondary | ICD-10-CM | POA: Diagnosis not present

## 2018-07-23 DIAGNOSIS — N2581 Secondary hyperparathyroidism of renal origin: Secondary | ICD-10-CM | POA: Diagnosis not present

## 2018-07-23 DIAGNOSIS — Z23 Encounter for immunization: Secondary | ICD-10-CM | POA: Diagnosis not present

## 2018-07-23 DIAGNOSIS — D509 Iron deficiency anemia, unspecified: Secondary | ICD-10-CM | POA: Diagnosis not present

## 2018-07-23 DIAGNOSIS — R17 Unspecified jaundice: Secondary | ICD-10-CM | POA: Diagnosis not present

## 2018-07-23 DIAGNOSIS — N186 End stage renal disease: Secondary | ICD-10-CM | POA: Diagnosis not present

## 2018-07-23 DIAGNOSIS — E440 Moderate protein-calorie malnutrition: Secondary | ICD-10-CM | POA: Diagnosis not present

## 2018-07-24 DIAGNOSIS — N2581 Secondary hyperparathyroidism of renal origin: Secondary | ICD-10-CM | POA: Diagnosis not present

## 2018-07-24 DIAGNOSIS — R82998 Other abnormal findings in urine: Secondary | ICD-10-CM | POA: Diagnosis not present

## 2018-07-24 DIAGNOSIS — Z79899 Other long term (current) drug therapy: Secondary | ICD-10-CM | POA: Diagnosis not present

## 2018-07-24 DIAGNOSIS — D631 Anemia in chronic kidney disease: Secondary | ICD-10-CM | POA: Diagnosis not present

## 2018-07-24 DIAGNOSIS — Z23 Encounter for immunization: Secondary | ICD-10-CM | POA: Diagnosis not present

## 2018-07-24 DIAGNOSIS — E440 Moderate protein-calorie malnutrition: Secondary | ICD-10-CM | POA: Diagnosis not present

## 2018-07-24 DIAGNOSIS — D509 Iron deficiency anemia, unspecified: Secondary | ICD-10-CM | POA: Diagnosis not present

## 2018-07-24 DIAGNOSIS — N186 End stage renal disease: Secondary | ICD-10-CM | POA: Diagnosis not present

## 2018-07-24 DIAGNOSIS — R17 Unspecified jaundice: Secondary | ICD-10-CM | POA: Diagnosis not present

## 2018-07-25 DIAGNOSIS — D509 Iron deficiency anemia, unspecified: Secondary | ICD-10-CM | POA: Diagnosis not present

## 2018-07-25 DIAGNOSIS — R82998 Other abnormal findings in urine: Secondary | ICD-10-CM | POA: Diagnosis not present

## 2018-07-25 DIAGNOSIS — R17 Unspecified jaundice: Secondary | ICD-10-CM | POA: Diagnosis not present

## 2018-07-25 DIAGNOSIS — Z23 Encounter for immunization: Secondary | ICD-10-CM | POA: Diagnosis not present

## 2018-07-25 DIAGNOSIS — N186 End stage renal disease: Secondary | ICD-10-CM | POA: Diagnosis not present

## 2018-07-25 DIAGNOSIS — E440 Moderate protein-calorie malnutrition: Secondary | ICD-10-CM | POA: Diagnosis not present

## 2018-07-25 DIAGNOSIS — Z79899 Other long term (current) drug therapy: Secondary | ICD-10-CM | POA: Diagnosis not present

## 2018-07-25 DIAGNOSIS — D631 Anemia in chronic kidney disease: Secondary | ICD-10-CM | POA: Diagnosis not present

## 2018-07-25 DIAGNOSIS — N2581 Secondary hyperparathyroidism of renal origin: Secondary | ICD-10-CM | POA: Diagnosis not present

## 2018-07-26 DIAGNOSIS — N186 End stage renal disease: Secondary | ICD-10-CM | POA: Diagnosis not present

## 2018-07-26 DIAGNOSIS — R17 Unspecified jaundice: Secondary | ICD-10-CM | POA: Diagnosis not present

## 2018-07-26 DIAGNOSIS — D631 Anemia in chronic kidney disease: Secondary | ICD-10-CM | POA: Diagnosis not present

## 2018-07-26 DIAGNOSIS — D509 Iron deficiency anemia, unspecified: Secondary | ICD-10-CM | POA: Diagnosis not present

## 2018-07-26 DIAGNOSIS — N2581 Secondary hyperparathyroidism of renal origin: Secondary | ICD-10-CM | POA: Diagnosis not present

## 2018-07-26 DIAGNOSIS — E440 Moderate protein-calorie malnutrition: Secondary | ICD-10-CM | POA: Diagnosis not present

## 2018-07-26 DIAGNOSIS — Z23 Encounter for immunization: Secondary | ICD-10-CM | POA: Diagnosis not present

## 2018-07-26 DIAGNOSIS — Z79899 Other long term (current) drug therapy: Secondary | ICD-10-CM | POA: Diagnosis not present

## 2018-07-26 DIAGNOSIS — R82998 Other abnormal findings in urine: Secondary | ICD-10-CM | POA: Diagnosis not present

## 2018-07-27 DIAGNOSIS — R82998 Other abnormal findings in urine: Secondary | ICD-10-CM | POA: Diagnosis not present

## 2018-07-27 DIAGNOSIS — E440 Moderate protein-calorie malnutrition: Secondary | ICD-10-CM | POA: Diagnosis not present

## 2018-07-27 DIAGNOSIS — N186 End stage renal disease: Secondary | ICD-10-CM | POA: Diagnosis not present

## 2018-07-27 DIAGNOSIS — D509 Iron deficiency anemia, unspecified: Secondary | ICD-10-CM | POA: Diagnosis not present

## 2018-07-27 DIAGNOSIS — Z23 Encounter for immunization: Secondary | ICD-10-CM | POA: Diagnosis not present

## 2018-07-27 DIAGNOSIS — Z79899 Other long term (current) drug therapy: Secondary | ICD-10-CM | POA: Diagnosis not present

## 2018-07-27 DIAGNOSIS — N2581 Secondary hyperparathyroidism of renal origin: Secondary | ICD-10-CM | POA: Diagnosis not present

## 2018-07-27 DIAGNOSIS — R17 Unspecified jaundice: Secondary | ICD-10-CM | POA: Diagnosis not present

## 2018-07-27 DIAGNOSIS — D631 Anemia in chronic kidney disease: Secondary | ICD-10-CM | POA: Diagnosis not present

## 2018-07-28 DIAGNOSIS — E440 Moderate protein-calorie malnutrition: Secondary | ICD-10-CM | POA: Diagnosis not present

## 2018-07-28 DIAGNOSIS — N186 End stage renal disease: Secondary | ICD-10-CM | POA: Diagnosis not present

## 2018-07-28 DIAGNOSIS — Z23 Encounter for immunization: Secondary | ICD-10-CM | POA: Diagnosis not present

## 2018-07-28 DIAGNOSIS — R17 Unspecified jaundice: Secondary | ICD-10-CM | POA: Diagnosis not present

## 2018-07-28 DIAGNOSIS — D631 Anemia in chronic kidney disease: Secondary | ICD-10-CM | POA: Diagnosis not present

## 2018-07-28 DIAGNOSIS — Z79899 Other long term (current) drug therapy: Secondary | ICD-10-CM | POA: Diagnosis not present

## 2018-07-28 DIAGNOSIS — D509 Iron deficiency anemia, unspecified: Secondary | ICD-10-CM | POA: Diagnosis not present

## 2018-07-28 DIAGNOSIS — R82998 Other abnormal findings in urine: Secondary | ICD-10-CM | POA: Diagnosis not present

## 2018-07-28 DIAGNOSIS — N2581 Secondary hyperparathyroidism of renal origin: Secondary | ICD-10-CM | POA: Diagnosis not present

## 2018-07-29 ENCOUNTER — Other Ambulatory Visit: Payer: Self-pay | Admitting: Cardiology

## 2018-07-29 DIAGNOSIS — R17 Unspecified jaundice: Secondary | ICD-10-CM | POA: Diagnosis not present

## 2018-07-29 DIAGNOSIS — R82998 Other abnormal findings in urine: Secondary | ICD-10-CM | POA: Diagnosis not present

## 2018-07-29 DIAGNOSIS — N2581 Secondary hyperparathyroidism of renal origin: Secondary | ICD-10-CM | POA: Diagnosis not present

## 2018-07-29 DIAGNOSIS — Z23 Encounter for immunization: Secondary | ICD-10-CM | POA: Diagnosis not present

## 2018-07-29 DIAGNOSIS — Z79899 Other long term (current) drug therapy: Secondary | ICD-10-CM | POA: Diagnosis not present

## 2018-07-29 DIAGNOSIS — E440 Moderate protein-calorie malnutrition: Secondary | ICD-10-CM | POA: Diagnosis not present

## 2018-07-29 DIAGNOSIS — D509 Iron deficiency anemia, unspecified: Secondary | ICD-10-CM | POA: Diagnosis not present

## 2018-07-29 DIAGNOSIS — D631 Anemia in chronic kidney disease: Secondary | ICD-10-CM | POA: Diagnosis not present

## 2018-07-29 DIAGNOSIS — N186 End stage renal disease: Secondary | ICD-10-CM | POA: Diagnosis not present

## 2018-07-30 DIAGNOSIS — R82998 Other abnormal findings in urine: Secondary | ICD-10-CM | POA: Diagnosis not present

## 2018-07-30 DIAGNOSIS — Z79899 Other long term (current) drug therapy: Secondary | ICD-10-CM | POA: Diagnosis not present

## 2018-07-30 DIAGNOSIS — D509 Iron deficiency anemia, unspecified: Secondary | ICD-10-CM | POA: Diagnosis not present

## 2018-07-30 DIAGNOSIS — N186 End stage renal disease: Secondary | ICD-10-CM | POA: Diagnosis not present

## 2018-07-30 DIAGNOSIS — D631 Anemia in chronic kidney disease: Secondary | ICD-10-CM | POA: Diagnosis not present

## 2018-07-30 DIAGNOSIS — E440 Moderate protein-calorie malnutrition: Secondary | ICD-10-CM | POA: Diagnosis not present

## 2018-07-30 DIAGNOSIS — R17 Unspecified jaundice: Secondary | ICD-10-CM | POA: Diagnosis not present

## 2018-07-30 DIAGNOSIS — Z23 Encounter for immunization: Secondary | ICD-10-CM | POA: Diagnosis not present

## 2018-07-30 DIAGNOSIS — N2581 Secondary hyperparathyroidism of renal origin: Secondary | ICD-10-CM | POA: Diagnosis not present

## 2018-07-31 DIAGNOSIS — R17 Unspecified jaundice: Secondary | ICD-10-CM | POA: Diagnosis not present

## 2018-07-31 DIAGNOSIS — Z23 Encounter for immunization: Secondary | ICD-10-CM | POA: Diagnosis not present

## 2018-07-31 DIAGNOSIS — N186 End stage renal disease: Secondary | ICD-10-CM | POA: Diagnosis not present

## 2018-07-31 DIAGNOSIS — Z79899 Other long term (current) drug therapy: Secondary | ICD-10-CM | POA: Diagnosis not present

## 2018-07-31 DIAGNOSIS — D509 Iron deficiency anemia, unspecified: Secondary | ICD-10-CM | POA: Diagnosis not present

## 2018-07-31 DIAGNOSIS — E440 Moderate protein-calorie malnutrition: Secondary | ICD-10-CM | POA: Diagnosis not present

## 2018-07-31 DIAGNOSIS — R82998 Other abnormal findings in urine: Secondary | ICD-10-CM | POA: Diagnosis not present

## 2018-07-31 DIAGNOSIS — N2581 Secondary hyperparathyroidism of renal origin: Secondary | ICD-10-CM | POA: Diagnosis not present

## 2018-07-31 DIAGNOSIS — D631 Anemia in chronic kidney disease: Secondary | ICD-10-CM | POA: Diagnosis not present

## 2018-08-01 DIAGNOSIS — D509 Iron deficiency anemia, unspecified: Secondary | ICD-10-CM | POA: Diagnosis not present

## 2018-08-01 DIAGNOSIS — D631 Anemia in chronic kidney disease: Secondary | ICD-10-CM | POA: Diagnosis not present

## 2018-08-01 DIAGNOSIS — R17 Unspecified jaundice: Secondary | ICD-10-CM | POA: Diagnosis not present

## 2018-08-01 DIAGNOSIS — R82998 Other abnormal findings in urine: Secondary | ICD-10-CM | POA: Diagnosis not present

## 2018-08-01 DIAGNOSIS — E440 Moderate protein-calorie malnutrition: Secondary | ICD-10-CM | POA: Diagnosis not present

## 2018-08-01 DIAGNOSIS — Z23 Encounter for immunization: Secondary | ICD-10-CM | POA: Diagnosis not present

## 2018-08-01 DIAGNOSIS — N2581 Secondary hyperparathyroidism of renal origin: Secondary | ICD-10-CM | POA: Diagnosis not present

## 2018-08-01 DIAGNOSIS — Z79899 Other long term (current) drug therapy: Secondary | ICD-10-CM | POA: Diagnosis not present

## 2018-08-01 DIAGNOSIS — N186 End stage renal disease: Secondary | ICD-10-CM | POA: Diagnosis not present

## 2018-08-02 DIAGNOSIS — D509 Iron deficiency anemia, unspecified: Secondary | ICD-10-CM | POA: Diagnosis not present

## 2018-08-02 DIAGNOSIS — R82998 Other abnormal findings in urine: Secondary | ICD-10-CM | POA: Diagnosis not present

## 2018-08-02 DIAGNOSIS — Z23 Encounter for immunization: Secondary | ICD-10-CM | POA: Diagnosis not present

## 2018-08-02 DIAGNOSIS — D631 Anemia in chronic kidney disease: Secondary | ICD-10-CM | POA: Diagnosis not present

## 2018-08-02 DIAGNOSIS — R17 Unspecified jaundice: Secondary | ICD-10-CM | POA: Diagnosis not present

## 2018-08-02 DIAGNOSIS — E440 Moderate protein-calorie malnutrition: Secondary | ICD-10-CM | POA: Diagnosis not present

## 2018-08-02 DIAGNOSIS — Z79899 Other long term (current) drug therapy: Secondary | ICD-10-CM | POA: Diagnosis not present

## 2018-08-02 DIAGNOSIS — N186 End stage renal disease: Secondary | ICD-10-CM | POA: Diagnosis not present

## 2018-08-02 DIAGNOSIS — N2581 Secondary hyperparathyroidism of renal origin: Secondary | ICD-10-CM | POA: Diagnosis not present

## 2018-08-03 DIAGNOSIS — D509 Iron deficiency anemia, unspecified: Secondary | ICD-10-CM | POA: Diagnosis not present

## 2018-08-03 DIAGNOSIS — R17 Unspecified jaundice: Secondary | ICD-10-CM | POA: Diagnosis not present

## 2018-08-03 DIAGNOSIS — N2581 Secondary hyperparathyroidism of renal origin: Secondary | ICD-10-CM | POA: Diagnosis not present

## 2018-08-03 DIAGNOSIS — N186 End stage renal disease: Secondary | ICD-10-CM | POA: Diagnosis not present

## 2018-08-03 DIAGNOSIS — Z79899 Other long term (current) drug therapy: Secondary | ICD-10-CM | POA: Diagnosis not present

## 2018-08-03 DIAGNOSIS — Z23 Encounter for immunization: Secondary | ICD-10-CM | POA: Diagnosis not present

## 2018-08-03 DIAGNOSIS — E440 Moderate protein-calorie malnutrition: Secondary | ICD-10-CM | POA: Diagnosis not present

## 2018-08-03 DIAGNOSIS — R82998 Other abnormal findings in urine: Secondary | ICD-10-CM | POA: Diagnosis not present

## 2018-08-03 DIAGNOSIS — D631 Anemia in chronic kidney disease: Secondary | ICD-10-CM | POA: Diagnosis not present

## 2018-08-04 DIAGNOSIS — D509 Iron deficiency anemia, unspecified: Secondary | ICD-10-CM | POA: Diagnosis not present

## 2018-08-04 DIAGNOSIS — D631 Anemia in chronic kidney disease: Secondary | ICD-10-CM | POA: Diagnosis not present

## 2018-08-04 DIAGNOSIS — Z79899 Other long term (current) drug therapy: Secondary | ICD-10-CM | POA: Diagnosis not present

## 2018-08-04 DIAGNOSIS — N2581 Secondary hyperparathyroidism of renal origin: Secondary | ICD-10-CM | POA: Diagnosis not present

## 2018-08-04 DIAGNOSIS — N186 End stage renal disease: Secondary | ICD-10-CM | POA: Diagnosis not present

## 2018-08-04 DIAGNOSIS — R82998 Other abnormal findings in urine: Secondary | ICD-10-CM | POA: Diagnosis not present

## 2018-08-04 DIAGNOSIS — Z23 Encounter for immunization: Secondary | ICD-10-CM | POA: Diagnosis not present

## 2018-08-04 DIAGNOSIS — R17 Unspecified jaundice: Secondary | ICD-10-CM | POA: Diagnosis not present

## 2018-08-04 DIAGNOSIS — E440 Moderate protein-calorie malnutrition: Secondary | ICD-10-CM | POA: Diagnosis not present

## 2018-08-05 DIAGNOSIS — Z79899 Other long term (current) drug therapy: Secondary | ICD-10-CM | POA: Diagnosis not present

## 2018-08-05 DIAGNOSIS — N2581 Secondary hyperparathyroidism of renal origin: Secondary | ICD-10-CM | POA: Diagnosis not present

## 2018-08-05 DIAGNOSIS — E440 Moderate protein-calorie malnutrition: Secondary | ICD-10-CM | POA: Diagnosis not present

## 2018-08-05 DIAGNOSIS — Z23 Encounter for immunization: Secondary | ICD-10-CM | POA: Diagnosis not present

## 2018-08-05 DIAGNOSIS — R82998 Other abnormal findings in urine: Secondary | ICD-10-CM | POA: Diagnosis not present

## 2018-08-05 DIAGNOSIS — N186 End stage renal disease: Secondary | ICD-10-CM | POA: Diagnosis not present

## 2018-08-05 DIAGNOSIS — D631 Anemia in chronic kidney disease: Secondary | ICD-10-CM | POA: Diagnosis not present

## 2018-08-05 DIAGNOSIS — D509 Iron deficiency anemia, unspecified: Secondary | ICD-10-CM | POA: Diagnosis not present

## 2018-08-05 DIAGNOSIS — R17 Unspecified jaundice: Secondary | ICD-10-CM | POA: Diagnosis not present

## 2018-08-06 DIAGNOSIS — N186 End stage renal disease: Secondary | ICD-10-CM | POA: Diagnosis not present

## 2018-08-06 DIAGNOSIS — Z79899 Other long term (current) drug therapy: Secondary | ICD-10-CM | POA: Diagnosis not present

## 2018-08-06 DIAGNOSIS — Z23 Encounter for immunization: Secondary | ICD-10-CM | POA: Diagnosis not present

## 2018-08-06 DIAGNOSIS — R82998 Other abnormal findings in urine: Secondary | ICD-10-CM | POA: Diagnosis not present

## 2018-08-06 DIAGNOSIS — N2581 Secondary hyperparathyroidism of renal origin: Secondary | ICD-10-CM | POA: Diagnosis not present

## 2018-08-06 DIAGNOSIS — D631 Anemia in chronic kidney disease: Secondary | ICD-10-CM | POA: Diagnosis not present

## 2018-08-06 DIAGNOSIS — D509 Iron deficiency anemia, unspecified: Secondary | ICD-10-CM | POA: Diagnosis not present

## 2018-08-06 DIAGNOSIS — R17 Unspecified jaundice: Secondary | ICD-10-CM | POA: Diagnosis not present

## 2018-08-06 DIAGNOSIS — E440 Moderate protein-calorie malnutrition: Secondary | ICD-10-CM | POA: Diagnosis not present

## 2018-08-07 DIAGNOSIS — N2581 Secondary hyperparathyroidism of renal origin: Secondary | ICD-10-CM | POA: Diagnosis not present

## 2018-08-07 DIAGNOSIS — N186 End stage renal disease: Secondary | ICD-10-CM | POA: Diagnosis not present

## 2018-08-07 DIAGNOSIS — Z23 Encounter for immunization: Secondary | ICD-10-CM | POA: Diagnosis not present

## 2018-08-07 DIAGNOSIS — E440 Moderate protein-calorie malnutrition: Secondary | ICD-10-CM | POA: Diagnosis not present

## 2018-08-07 DIAGNOSIS — Z79899 Other long term (current) drug therapy: Secondary | ICD-10-CM | POA: Diagnosis not present

## 2018-08-07 DIAGNOSIS — R82998 Other abnormal findings in urine: Secondary | ICD-10-CM | POA: Diagnosis not present

## 2018-08-07 DIAGNOSIS — D631 Anemia in chronic kidney disease: Secondary | ICD-10-CM | POA: Diagnosis not present

## 2018-08-07 DIAGNOSIS — D509 Iron deficiency anemia, unspecified: Secondary | ICD-10-CM | POA: Diagnosis not present

## 2018-08-07 DIAGNOSIS — R17 Unspecified jaundice: Secondary | ICD-10-CM | POA: Diagnosis not present

## 2018-08-08 DIAGNOSIS — D509 Iron deficiency anemia, unspecified: Secondary | ICD-10-CM | POA: Diagnosis not present

## 2018-08-08 DIAGNOSIS — Z23 Encounter for immunization: Secondary | ICD-10-CM | POA: Diagnosis not present

## 2018-08-08 DIAGNOSIS — R17 Unspecified jaundice: Secondary | ICD-10-CM | POA: Diagnosis not present

## 2018-08-08 DIAGNOSIS — N2581 Secondary hyperparathyroidism of renal origin: Secondary | ICD-10-CM | POA: Diagnosis not present

## 2018-08-08 DIAGNOSIS — E440 Moderate protein-calorie malnutrition: Secondary | ICD-10-CM | POA: Diagnosis not present

## 2018-08-08 DIAGNOSIS — R82998 Other abnormal findings in urine: Secondary | ICD-10-CM | POA: Diagnosis not present

## 2018-08-08 DIAGNOSIS — Z79899 Other long term (current) drug therapy: Secondary | ICD-10-CM | POA: Diagnosis not present

## 2018-08-08 DIAGNOSIS — N186 End stage renal disease: Secondary | ICD-10-CM | POA: Diagnosis not present

## 2018-08-08 DIAGNOSIS — D631 Anemia in chronic kidney disease: Secondary | ICD-10-CM | POA: Diagnosis not present

## 2018-08-09 DIAGNOSIS — E440 Moderate protein-calorie malnutrition: Secondary | ICD-10-CM | POA: Diagnosis not present

## 2018-08-09 DIAGNOSIS — D631 Anemia in chronic kidney disease: Secondary | ICD-10-CM | POA: Diagnosis not present

## 2018-08-09 DIAGNOSIS — D509 Iron deficiency anemia, unspecified: Secondary | ICD-10-CM | POA: Diagnosis not present

## 2018-08-09 DIAGNOSIS — R82998 Other abnormal findings in urine: Secondary | ICD-10-CM | POA: Diagnosis not present

## 2018-08-09 DIAGNOSIS — N2581 Secondary hyperparathyroidism of renal origin: Secondary | ICD-10-CM | POA: Diagnosis not present

## 2018-08-09 DIAGNOSIS — N186 End stage renal disease: Secondary | ICD-10-CM | POA: Diagnosis not present

## 2018-08-09 DIAGNOSIS — R17 Unspecified jaundice: Secondary | ICD-10-CM | POA: Diagnosis not present

## 2018-08-09 DIAGNOSIS — Z79899 Other long term (current) drug therapy: Secondary | ICD-10-CM | POA: Diagnosis not present

## 2018-08-09 DIAGNOSIS — Z23 Encounter for immunization: Secondary | ICD-10-CM | POA: Diagnosis not present

## 2018-08-10 DIAGNOSIS — D631 Anemia in chronic kidney disease: Secondary | ICD-10-CM | POA: Diagnosis not present

## 2018-08-10 DIAGNOSIS — Z23 Encounter for immunization: Secondary | ICD-10-CM | POA: Diagnosis not present

## 2018-08-10 DIAGNOSIS — R17 Unspecified jaundice: Secondary | ICD-10-CM | POA: Diagnosis not present

## 2018-08-10 DIAGNOSIS — N2581 Secondary hyperparathyroidism of renal origin: Secondary | ICD-10-CM | POA: Diagnosis not present

## 2018-08-10 DIAGNOSIS — E440 Moderate protein-calorie malnutrition: Secondary | ICD-10-CM | POA: Diagnosis not present

## 2018-08-10 DIAGNOSIS — Z79899 Other long term (current) drug therapy: Secondary | ICD-10-CM | POA: Diagnosis not present

## 2018-08-10 DIAGNOSIS — R82998 Other abnormal findings in urine: Secondary | ICD-10-CM | POA: Diagnosis not present

## 2018-08-10 DIAGNOSIS — N186 End stage renal disease: Secondary | ICD-10-CM | POA: Diagnosis not present

## 2018-08-10 DIAGNOSIS — D509 Iron deficiency anemia, unspecified: Secondary | ICD-10-CM | POA: Diagnosis not present

## 2018-08-11 DIAGNOSIS — D509 Iron deficiency anemia, unspecified: Secondary | ICD-10-CM | POA: Diagnosis not present

## 2018-08-11 DIAGNOSIS — N2581 Secondary hyperparathyroidism of renal origin: Secondary | ICD-10-CM | POA: Diagnosis not present

## 2018-08-11 DIAGNOSIS — Z79899 Other long term (current) drug therapy: Secondary | ICD-10-CM | POA: Diagnosis not present

## 2018-08-11 DIAGNOSIS — R82998 Other abnormal findings in urine: Secondary | ICD-10-CM | POA: Diagnosis not present

## 2018-08-11 DIAGNOSIS — R17 Unspecified jaundice: Secondary | ICD-10-CM | POA: Diagnosis not present

## 2018-08-11 DIAGNOSIS — D631 Anemia in chronic kidney disease: Secondary | ICD-10-CM | POA: Diagnosis not present

## 2018-08-11 DIAGNOSIS — Z23 Encounter for immunization: Secondary | ICD-10-CM | POA: Diagnosis not present

## 2018-08-11 DIAGNOSIS — E440 Moderate protein-calorie malnutrition: Secondary | ICD-10-CM | POA: Diagnosis not present

## 2018-08-11 DIAGNOSIS — N186 End stage renal disease: Secondary | ICD-10-CM | POA: Diagnosis not present

## 2018-08-11 NOTE — Progress Notes (Signed)
Cardiology Office Note   Date:  08/13/2018   ID:  Farran Amsden, DOB [birth year], MRN [id number]  PCP:  Unk Pinto, MD  Cardiologist: [facility]  Chief Complaint  Patient presents with  . Hypertension   History of Present Illness: Eileen Perez is a 77 y.o. female who presents for ongoing assessment and management of hypertension, with multiple cardiovascular risk factors to include diabetes, hyperlipidemia, chronic renal insufficiency, on peritoneal dialysis.  Patient was last seen by cardiology on 06/16/2016, and was found to be stable, having a preoperative evaluation.  The patient was to follow-up PRN.  She comes today without complaints but needs refills on hydralazine. She is followed by PCP for annual labs. She is medically compliant and has no chest pain, DOE,fatiuge or dizziness.  She is followed by nephrology for peritoneal dialysis. She has some musculoskeletal complaints of pain in the lateral left leg at the tibial area just above the ankle when she walks.   Past Medical History:  Diagnosis Date  . Anemia   . Arthritis    Osteoarthritis  . Chronic kidney disease    Chronic renal insufficiency  . Diabetes mellitus    Pre  . Diverticulosis   . GERD (gastroesophageal reflux disease)   . Gout   . Hiatal hernia   . History of blood transfusion   . Hyperlipidemia   . Hypertension   . Osteopenia   . Pancreatic cyst 1999  . Thyroid disease    Hyperparathyroid     Past Surgical History:  Procedure Laterality Date  . CHOLECYSTECTOMY    . COLONOSCOPY  03/21/12   Next one in 2018  . EYE SURGERY Left    cataract extraction with IOL  . INSERTION OF DIALYSIS CATHETER N/A 02/15/2017   Procedure: REVISION OF DIALYSIS CATHETER;  Surgeon: Algernon Huxley, MD;  Location: [facility] ORS;  Service: Cardiovascular;  Laterality: N/A;  . JOINT REPLACEMENT  2012   left knee  . left knee replacement     . PANCREATIC CYST EXCISION  1999  . TOTAL HIP ARTHROPLASTY Right 05/26/2015   Procedure:  RIGHT TOTAL HIP ARTHROPLASTY ANTERIOR APPROACH;  Surgeon: Gaynelle Arabian, MD;  Location: [facility] ORS;  Service: Orthopedics;  Laterality: Right;  . TOTAL KNEE ARTHROPLASTY Right 06/09/2013   Procedure: RIGHT TOTAL KNEE ARTHROPLASTY;  Surgeon: Gearlean Alf, MD;  Location: [facility] ORS;  Service: Orthopedics;  Laterality: Right;     Current Outpatient Medications  Medication Sig Dispense Refill  . allopurinol (ZYLOPRIM) 300 MG tablet TAKE 1/2 TO 1 TABLET BY  MOUTH DAILY AS DIRECTED 90 tablet 3  . aspirin 81 MG tablet Take 81 mg by mouth at bedtime.     Marland Kitchen b complex-vitamin c-folic acid (NEPHRO-VITE) 0.8 MG TABS tablet Take 1 tablet by mouth at bedtime.    . calcitRIOL (ROCALTROL) 0.5 MCG capsule Take 0.5 mcg by mouth daily.     . calcium acetate (PHOSLO) 667 MG capsule Take 667 mg by mouth 3 (three) times daily with meals.     . Cholecalciferol (VITAMIN D-3 PO) Take 5,000 Units by mouth every other day.     . cinacalcet (SENSIPAR) 30 MG tablet Take 30 mg by mouth 2 (two) times daily.     Marland Kitchen docusate sodium (COLACE) 100 MG capsule Take 100 mg by mouth 2 (two) times daily.    . Flaxseed, Linseed, (FLAXSEED OIL) 1000 MG CAPS Take 1 capsule by mouth daily.     Marland Kitchen glucose blood (ACCU-CHEK AVIVA PLUS) test strip Test  once daily 100 each 6  . hydrALAZINE (APRESOLINE) 10 MG tablet Take 1 tablet (10 mg total) by mouth 2 (two) times daily. PT IS OUT OF MEDICATION-PLEASE SEND OVERNIGHT 90 tablet 3  . labetalol (NORMODYNE) 200 MG tablet TAKE 1 TABLET BY MOUTH TWO  TIMES DAILY 180 tablet 1  . Omega-3 Fatty Acids (FISH OIL) 1200 MG CAPS Take 2 capsules by mouth daily.    . rosuvastatin (CRESTOR) 40 MG tablet Take 1/2 to 1 tablet daily or as directed for Cholesterol 90 tablet 5  . sevelamer carbonate (RENVELA) 800 MG tablet Take 1,600 mg by mouth 3 (three) times daily with meals.     . zinc gluconate 50 MG tablet Take 50 mg by mouth every morning.     No current facility-administered medications for this visit.      Allergies:   Ace inhibitors and Nsaids    Social History:  The patient  reports that she has never smoked. She has never used smokeless tobacco. She reports that she does not drink alcohol or use drugs.   Family History:  The patient's family history includes Cancer in her father and mother; Diabetes in her mother; Hypertension in her mother.    ROS: All other systems are reviewed and negative. Unless otherwise mentioned in H&P    PHYSICAL EXAM: VS:  BP 112/78   Ht 5\' 2"  (1.575 m)   Wt 150 lb 9.6 oz (68.3 kg)   BMI 27.55 kg/m  , BMI Body mass index is 27.55 kg/m. GEN: Well nourished, well developed, in no acute distress  HEENT: normal  Neck: no JVD, carotid bruits, or masses Cardiac: RRR; 1/6 systolic murmurs, rubs, or gallops,no edema  Respiratory:  clear to auscultation bilaterally, normal work of breathing GI: soft, nontender, nondistended, + BS MS: no deformity or atrophy  Skin: warm and dry, no rash Neuro:  Strength and sensation are intact Psych: euthymic mood, full affect   EKG:   Recent Labs: 02/14/2018: BUN 74; Creat 10.79; Hemoglobin 11.3; Platelets 260; Potassium 4.0; Sodium 140 05/30/2018: ALT 13; Magnesium 2.1; TSH 2.25    Lipid Panel    Component Value Date/Time   CHOL 213 (H) 05/30/2018 1008   TRIG 289 (H) 05/30/2018 1008   HDL 46 (L) 05/30/2018 1008   CHOLHDL 4.6 05/30/2018 1008   VLDL 29 06/15/2017 1028   LDLCALC 123 (H) 05/30/2018 1008      Wt Readings from Last 3 Encounters:  08/13/18 150 lb 9.6 oz (68.3 kg)  05/30/18 148 lb (67.1 kg)  02/14/18 147 lb 12.8 oz (67 kg)      Other studies Reviewed: None    ASSESSMENT AND PLAN:  1.  Renovascular Hypertension: She is medically complaint with good control of her BP. I will refill her hydralazine 10 mg BID. She will continue her other antihypertensives which are also followed by nephrology and PCP.   2. Chronic Kidney Disease: She has peritoneal dialysis at night. Followed by nephrology  for labs monthly.   3. Hyperlipidemia: Followed by PCP.   Current medicines are reviewed at length with the patient today.    Labs/ tests ordered today include: None  Phill Myron. [place] Pugh, ANP, AACC   08/13/2018 11:59 AM    [place]. [address] [place], [place] [address] Phone: [phone]; Fax: [phone]

## 2018-08-12 DIAGNOSIS — E440 Moderate protein-calorie malnutrition: Secondary | ICD-10-CM | POA: Diagnosis not present

## 2018-08-12 DIAGNOSIS — Z23 Encounter for immunization: Secondary | ICD-10-CM | POA: Diagnosis not present

## 2018-08-12 DIAGNOSIS — R17 Unspecified jaundice: Secondary | ICD-10-CM | POA: Diagnosis not present

## 2018-08-12 DIAGNOSIS — N186 End stage renal disease: Secondary | ICD-10-CM | POA: Diagnosis not present

## 2018-08-12 DIAGNOSIS — Z79899 Other long term (current) drug therapy: Secondary | ICD-10-CM | POA: Diagnosis not present

## 2018-08-12 DIAGNOSIS — N2581 Secondary hyperparathyroidism of renal origin: Secondary | ICD-10-CM | POA: Diagnosis not present

## 2018-08-12 DIAGNOSIS — R82998 Other abnormal findings in urine: Secondary | ICD-10-CM | POA: Diagnosis not present

## 2018-08-12 DIAGNOSIS — D509 Iron deficiency anemia, unspecified: Secondary | ICD-10-CM | POA: Diagnosis not present

## 2018-08-12 DIAGNOSIS — D631 Anemia in chronic kidney disease: Secondary | ICD-10-CM | POA: Diagnosis not present

## 2018-08-13 ENCOUNTER — Encounter: Payer: Self-pay | Admitting: Adult Health

## 2018-08-13 ENCOUNTER — Ambulatory Visit: Payer: Medicare Other | Admitting: Adult Health

## 2018-08-13 VITALS — BP 112/78 | Ht 62.0 in | Wt 150.6 lb

## 2018-08-13 DIAGNOSIS — D509 Iron deficiency anemia, unspecified: Secondary | ICD-10-CM | POA: Diagnosis not present

## 2018-08-13 DIAGNOSIS — E7800 Pure hypercholesterolemia, unspecified: Secondary | ICD-10-CM

## 2018-08-13 DIAGNOSIS — N186 End stage renal disease: Secondary | ICD-10-CM | POA: Diagnosis not present

## 2018-08-13 DIAGNOSIS — N2581 Secondary hyperparathyroidism of renal origin: Secondary | ICD-10-CM | POA: Diagnosis not present

## 2018-08-13 DIAGNOSIS — I10 Essential (primary) hypertension: Secondary | ICD-10-CM | POA: Diagnosis not present

## 2018-08-13 DIAGNOSIS — Z23 Encounter for immunization: Secondary | ICD-10-CM | POA: Diagnosis not present

## 2018-08-13 DIAGNOSIS — R82998 Other abnormal findings in urine: Secondary | ICD-10-CM | POA: Diagnosis not present

## 2018-08-13 DIAGNOSIS — R17 Unspecified jaundice: Secondary | ICD-10-CM | POA: Diagnosis not present

## 2018-08-13 DIAGNOSIS — E440 Moderate protein-calorie malnutrition: Secondary | ICD-10-CM | POA: Diagnosis not present

## 2018-08-13 DIAGNOSIS — D631 Anemia in chronic kidney disease: Secondary | ICD-10-CM | POA: Diagnosis not present

## 2018-08-13 DIAGNOSIS — Z79899 Other long term (current) drug therapy: Secondary | ICD-10-CM | POA: Diagnosis not present

## 2018-08-13 MED ORDER — HYDRALAZINE HCL 10 MG PO TABS: 10.0000 mg | ORAL_TABLET | Freq: Two times a day (BID) | ORAL | 3 refills | Status: DC

## 2018-08-13 NOTE — Patient Instructions (Signed)
Medication Instructions:  NO CHANGES- Your physician recommends that you continue on your current medications as directed. Please refer to the Current Medication list given to you today.  If you need a refill on your cardiac medications before your next appointment, please call your pharmacy.  Follow-Up: Your physician wants you to follow-up in: [place] should receive a reminder letter in the mail two months in advance. If you do not receive a letter, please call our office in JAN 2020 to schedule your MARCH 2020 follow-up appointment.   Thank you for choosing [facility] at [facility]!!

## 2018-08-14 DIAGNOSIS — R82998 Other abnormal findings in urine: Secondary | ICD-10-CM | POA: Diagnosis not present

## 2018-08-14 DIAGNOSIS — R17 Unspecified jaundice: Secondary | ICD-10-CM | POA: Diagnosis not present

## 2018-08-14 DIAGNOSIS — D509 Iron deficiency anemia, unspecified: Secondary | ICD-10-CM | POA: Diagnosis not present

## 2018-08-14 DIAGNOSIS — D631 Anemia in chronic kidney disease: Secondary | ICD-10-CM | POA: Diagnosis not present

## 2018-08-14 DIAGNOSIS — N2581 Secondary hyperparathyroidism of renal origin: Secondary | ICD-10-CM | POA: Diagnosis not present

## 2018-08-14 DIAGNOSIS — E440 Moderate protein-calorie malnutrition: Secondary | ICD-10-CM | POA: Diagnosis not present

## 2018-08-14 DIAGNOSIS — Z79899 Other long term (current) drug therapy: Secondary | ICD-10-CM | POA: Diagnosis not present

## 2018-08-14 DIAGNOSIS — N186 End stage renal disease: Secondary | ICD-10-CM | POA: Diagnosis not present

## 2018-08-14 DIAGNOSIS — Z23 Encounter for immunization: Secondary | ICD-10-CM | POA: Diagnosis not present

## 2018-08-15 DIAGNOSIS — E440 Moderate protein-calorie malnutrition: Secondary | ICD-10-CM | POA: Diagnosis not present

## 2018-08-15 DIAGNOSIS — R17 Unspecified jaundice: Secondary | ICD-10-CM | POA: Diagnosis not present

## 2018-08-15 DIAGNOSIS — Z23 Encounter for immunization: Secondary | ICD-10-CM | POA: Diagnosis not present

## 2018-08-15 DIAGNOSIS — Z79899 Other long term (current) drug therapy: Secondary | ICD-10-CM | POA: Diagnosis not present

## 2018-08-15 DIAGNOSIS — N186 End stage renal disease: Secondary | ICD-10-CM | POA: Diagnosis not present

## 2018-08-15 DIAGNOSIS — R82998 Other abnormal findings in urine: Secondary | ICD-10-CM | POA: Diagnosis not present

## 2018-08-15 DIAGNOSIS — D509 Iron deficiency anemia, unspecified: Secondary | ICD-10-CM | POA: Diagnosis not present

## 2018-08-15 DIAGNOSIS — N2581 Secondary hyperparathyroidism of renal origin: Secondary | ICD-10-CM | POA: Diagnosis not present

## 2018-08-15 DIAGNOSIS — D631 Anemia in chronic kidney disease: Secondary | ICD-10-CM | POA: Diagnosis not present

## 2018-08-16 DIAGNOSIS — N186 End stage renal disease: Secondary | ICD-10-CM | POA: Diagnosis not present

## 2018-08-16 DIAGNOSIS — D631 Anemia in chronic kidney disease: Secondary | ICD-10-CM | POA: Diagnosis not present

## 2018-08-16 DIAGNOSIS — R82998 Other abnormal findings in urine: Secondary | ICD-10-CM | POA: Diagnosis not present

## 2018-08-16 DIAGNOSIS — N2581 Secondary hyperparathyroidism of renal origin: Secondary | ICD-10-CM | POA: Diagnosis not present

## 2018-08-16 DIAGNOSIS — Z79899 Other long term (current) drug therapy: Secondary | ICD-10-CM | POA: Diagnosis not present

## 2018-08-16 DIAGNOSIS — R17 Unspecified jaundice: Secondary | ICD-10-CM | POA: Diagnosis not present

## 2018-08-16 DIAGNOSIS — E440 Moderate protein-calorie malnutrition: Secondary | ICD-10-CM | POA: Diagnosis not present

## 2018-08-16 DIAGNOSIS — D509 Iron deficiency anemia, unspecified: Secondary | ICD-10-CM | POA: Diagnosis not present

## 2018-08-16 DIAGNOSIS — Z23 Encounter for immunization: Secondary | ICD-10-CM | POA: Diagnosis not present

## 2018-08-17 DIAGNOSIS — Z23 Encounter for immunization: Secondary | ICD-10-CM | POA: Diagnosis not present

## 2018-08-17 DIAGNOSIS — R17 Unspecified jaundice: Secondary | ICD-10-CM | POA: Diagnosis not present

## 2018-08-17 DIAGNOSIS — R82998 Other abnormal findings in urine: Secondary | ICD-10-CM | POA: Diagnosis not present

## 2018-08-17 DIAGNOSIS — N186 End stage renal disease: Secondary | ICD-10-CM | POA: Diagnosis not present

## 2018-08-17 DIAGNOSIS — N2581 Secondary hyperparathyroidism of renal origin: Secondary | ICD-10-CM | POA: Diagnosis not present

## 2018-08-17 DIAGNOSIS — E440 Moderate protein-calorie malnutrition: Secondary | ICD-10-CM | POA: Diagnosis not present

## 2018-08-17 DIAGNOSIS — Z79899 Other long term (current) drug therapy: Secondary | ICD-10-CM | POA: Diagnosis not present

## 2018-08-17 DIAGNOSIS — D631 Anemia in chronic kidney disease: Secondary | ICD-10-CM | POA: Diagnosis not present

## 2018-08-17 DIAGNOSIS — D509 Iron deficiency anemia, unspecified: Secondary | ICD-10-CM | POA: Diagnosis not present

## 2018-08-18 DIAGNOSIS — N2581 Secondary hyperparathyroidism of renal origin: Secondary | ICD-10-CM | POA: Diagnosis not present

## 2018-08-18 DIAGNOSIS — Z79899 Other long term (current) drug therapy: Secondary | ICD-10-CM | POA: Diagnosis not present

## 2018-08-18 DIAGNOSIS — R82998 Other abnormal findings in urine: Secondary | ICD-10-CM | POA: Diagnosis not present

## 2018-08-18 DIAGNOSIS — N186 End stage renal disease: Secondary | ICD-10-CM | POA: Diagnosis not present

## 2018-08-18 DIAGNOSIS — D509 Iron deficiency anemia, unspecified: Secondary | ICD-10-CM | POA: Diagnosis not present

## 2018-08-18 DIAGNOSIS — D631 Anemia in chronic kidney disease: Secondary | ICD-10-CM | POA: Diagnosis not present

## 2018-08-18 DIAGNOSIS — R17 Unspecified jaundice: Secondary | ICD-10-CM | POA: Diagnosis not present

## 2018-08-18 DIAGNOSIS — Z23 Encounter for immunization: Secondary | ICD-10-CM | POA: Diagnosis not present

## 2018-08-18 DIAGNOSIS — E440 Moderate protein-calorie malnutrition: Secondary | ICD-10-CM | POA: Diagnosis not present

## 2018-08-19 DIAGNOSIS — E440 Moderate protein-calorie malnutrition: Secondary | ICD-10-CM | POA: Diagnosis not present

## 2018-08-19 DIAGNOSIS — R82998 Other abnormal findings in urine: Secondary | ICD-10-CM | POA: Diagnosis not present

## 2018-08-19 DIAGNOSIS — Z992 Dependence on renal dialysis: Secondary | ICD-10-CM | POA: Diagnosis not present

## 2018-08-19 DIAGNOSIS — N2581 Secondary hyperparathyroidism of renal origin: Secondary | ICD-10-CM | POA: Diagnosis not present

## 2018-08-19 DIAGNOSIS — R17 Unspecified jaundice: Secondary | ICD-10-CM | POA: Diagnosis not present

## 2018-08-19 DIAGNOSIS — Z23 Encounter for immunization: Secondary | ICD-10-CM | POA: Diagnosis not present

## 2018-08-19 DIAGNOSIS — D631 Anemia in chronic kidney disease: Secondary | ICD-10-CM | POA: Diagnosis not present

## 2018-08-19 DIAGNOSIS — N186 End stage renal disease: Secondary | ICD-10-CM | POA: Diagnosis not present

## 2018-08-19 DIAGNOSIS — E1122 Type 2 diabetes mellitus with diabetic chronic kidney disease: Secondary | ICD-10-CM | POA: Diagnosis not present

## 2018-08-19 DIAGNOSIS — Z79899 Other long term (current) drug therapy: Secondary | ICD-10-CM | POA: Diagnosis not present

## 2018-08-19 DIAGNOSIS — D509 Iron deficiency anemia, unspecified: Secondary | ICD-10-CM | POA: Diagnosis not present

## 2018-08-20 DIAGNOSIS — N2589 Other disorders resulting from impaired renal tubular function: Secondary | ICD-10-CM | POA: Diagnosis not present

## 2018-08-20 DIAGNOSIS — R82998 Other abnormal findings in urine: Secondary | ICD-10-CM | POA: Diagnosis not present

## 2018-08-20 DIAGNOSIS — Z4932 Encounter for adequacy testing for peritoneal dialysis: Secondary | ICD-10-CM | POA: Diagnosis not present

## 2018-08-20 DIAGNOSIS — E7849 Other hyperlipidemia: Secondary | ICD-10-CM | POA: Diagnosis not present

## 2018-08-20 DIAGNOSIS — D509 Iron deficiency anemia, unspecified: Secondary | ICD-10-CM | POA: Diagnosis not present

## 2018-08-20 DIAGNOSIS — D631 Anemia in chronic kidney disease: Secondary | ICD-10-CM | POA: Diagnosis not present

## 2018-08-20 DIAGNOSIS — N186 End stage renal disease: Secondary | ICD-10-CM | POA: Diagnosis not present

## 2018-08-20 DIAGNOSIS — E119 Type 2 diabetes mellitus without complications: Secondary | ICD-10-CM | POA: Diagnosis not present

## 2018-08-20 DIAGNOSIS — N2581 Secondary hyperparathyroidism of renal origin: Secondary | ICD-10-CM | POA: Diagnosis not present

## 2018-08-21 DIAGNOSIS — D631 Anemia in chronic kidney disease: Secondary | ICD-10-CM | POA: Diagnosis not present

## 2018-08-21 DIAGNOSIS — N2581 Secondary hyperparathyroidism of renal origin: Secondary | ICD-10-CM | POA: Diagnosis not present

## 2018-08-21 DIAGNOSIS — N186 End stage renal disease: Secondary | ICD-10-CM | POA: Diagnosis not present

## 2018-08-21 DIAGNOSIS — Z4932 Encounter for adequacy testing for peritoneal dialysis: Secondary | ICD-10-CM | POA: Diagnosis not present

## 2018-08-21 DIAGNOSIS — R82998 Other abnormal findings in urine: Secondary | ICD-10-CM | POA: Diagnosis not present

## 2018-08-21 DIAGNOSIS — D509 Iron deficiency anemia, unspecified: Secondary | ICD-10-CM | POA: Diagnosis not present

## 2018-08-21 DIAGNOSIS — N2589 Other disorders resulting from impaired renal tubular function: Secondary | ICD-10-CM | POA: Diagnosis not present

## 2018-08-21 DIAGNOSIS — E7849 Other hyperlipidemia: Secondary | ICD-10-CM | POA: Diagnosis not present

## 2018-08-21 DIAGNOSIS — E119 Type 2 diabetes mellitus without complications: Secondary | ICD-10-CM | POA: Diagnosis not present

## 2018-08-22 DIAGNOSIS — D631 Anemia in chronic kidney disease: Secondary | ICD-10-CM | POA: Diagnosis not present

## 2018-08-22 DIAGNOSIS — E119 Type 2 diabetes mellitus without complications: Secondary | ICD-10-CM | POA: Diagnosis not present

## 2018-08-22 DIAGNOSIS — N186 End stage renal disease: Secondary | ICD-10-CM | POA: Diagnosis not present

## 2018-08-22 DIAGNOSIS — R82998 Other abnormal findings in urine: Secondary | ICD-10-CM | POA: Diagnosis not present

## 2018-08-22 DIAGNOSIS — N2581 Secondary hyperparathyroidism of renal origin: Secondary | ICD-10-CM | POA: Diagnosis not present

## 2018-08-22 DIAGNOSIS — Z4932 Encounter for adequacy testing for peritoneal dialysis: Secondary | ICD-10-CM | POA: Diagnosis not present

## 2018-08-22 DIAGNOSIS — E7849 Other hyperlipidemia: Secondary | ICD-10-CM | POA: Diagnosis not present

## 2018-08-22 DIAGNOSIS — D509 Iron deficiency anemia, unspecified: Secondary | ICD-10-CM | POA: Diagnosis not present

## 2018-08-22 DIAGNOSIS — N2589 Other disorders resulting from impaired renal tubular function: Secondary | ICD-10-CM | POA: Diagnosis not present

## 2018-08-23 ENCOUNTER — Encounter (INDEPENDENT_AMBULATORY_CARE_PROVIDER_SITE_OTHER): Payer: Medicare Other | Admitting: Ophthalmology

## 2018-08-23 DIAGNOSIS — R82998 Other abnormal findings in urine: Secondary | ICD-10-CM | POA: Diagnosis not present

## 2018-08-23 DIAGNOSIS — E7849 Other hyperlipidemia: Secondary | ICD-10-CM | POA: Diagnosis not present

## 2018-08-23 DIAGNOSIS — N2589 Other disorders resulting from impaired renal tubular function: Secondary | ICD-10-CM | POA: Diagnosis not present

## 2018-08-23 DIAGNOSIS — E113292 Type 2 diabetes mellitus with mild nonproliferative diabetic retinopathy without macular edema, left eye: Secondary | ICD-10-CM | POA: Diagnosis not present

## 2018-08-23 DIAGNOSIS — E11311 Type 2 diabetes mellitus with unspecified diabetic retinopathy with macular edema: Secondary | ICD-10-CM

## 2018-08-23 DIAGNOSIS — H35033 Hypertensive retinopathy, bilateral: Secondary | ICD-10-CM | POA: Diagnosis not present

## 2018-08-23 DIAGNOSIS — I10 Essential (primary) hypertension: Secondary | ICD-10-CM | POA: Diagnosis not present

## 2018-08-23 DIAGNOSIS — D631 Anemia in chronic kidney disease: Secondary | ICD-10-CM | POA: Diagnosis not present

## 2018-08-23 DIAGNOSIS — E119 Type 2 diabetes mellitus without complications: Secondary | ICD-10-CM | POA: Diagnosis not present

## 2018-08-23 DIAGNOSIS — Z4932 Encounter for adequacy testing for peritoneal dialysis: Secondary | ICD-10-CM | POA: Diagnosis not present

## 2018-08-23 DIAGNOSIS — D509 Iron deficiency anemia, unspecified: Secondary | ICD-10-CM | POA: Diagnosis not present

## 2018-08-23 DIAGNOSIS — N2581 Secondary hyperparathyroidism of renal origin: Secondary | ICD-10-CM | POA: Diagnosis not present

## 2018-08-23 DIAGNOSIS — N186 End stage renal disease: Secondary | ICD-10-CM | POA: Diagnosis not present

## 2018-08-23 DIAGNOSIS — E113311 Type 2 diabetes mellitus with moderate nonproliferative diabetic retinopathy with macular edema, right eye: Secondary | ICD-10-CM | POA: Diagnosis not present

## 2018-08-23 DIAGNOSIS — H43813 Vitreous degeneration, bilateral: Secondary | ICD-10-CM

## 2018-08-24 DIAGNOSIS — N186 End stage renal disease: Secondary | ICD-10-CM | POA: Diagnosis not present

## 2018-08-24 DIAGNOSIS — D509 Iron deficiency anemia, unspecified: Secondary | ICD-10-CM | POA: Diagnosis not present

## 2018-08-24 DIAGNOSIS — N2589 Other disorders resulting from impaired renal tubular function: Secondary | ICD-10-CM | POA: Diagnosis not present

## 2018-08-24 DIAGNOSIS — E119 Type 2 diabetes mellitus without complications: Secondary | ICD-10-CM | POA: Diagnosis not present

## 2018-08-24 DIAGNOSIS — E7849 Other hyperlipidemia: Secondary | ICD-10-CM | POA: Diagnosis not present

## 2018-08-24 DIAGNOSIS — Z4932 Encounter for adequacy testing for peritoneal dialysis: Secondary | ICD-10-CM | POA: Diagnosis not present

## 2018-08-24 DIAGNOSIS — N2581 Secondary hyperparathyroidism of renal origin: Secondary | ICD-10-CM | POA: Diagnosis not present

## 2018-08-24 DIAGNOSIS — R82998 Other abnormal findings in urine: Secondary | ICD-10-CM | POA: Diagnosis not present

## 2018-08-24 DIAGNOSIS — D631 Anemia in chronic kidney disease: Secondary | ICD-10-CM | POA: Diagnosis not present

## 2018-08-25 DIAGNOSIS — D509 Iron deficiency anemia, unspecified: Secondary | ICD-10-CM | POA: Diagnosis not present

## 2018-08-25 DIAGNOSIS — E7849 Other hyperlipidemia: Secondary | ICD-10-CM | POA: Diagnosis not present

## 2018-08-25 DIAGNOSIS — N186 End stage renal disease: Secondary | ICD-10-CM | POA: Diagnosis not present

## 2018-08-25 DIAGNOSIS — Z4932 Encounter for adequacy testing for peritoneal dialysis: Secondary | ICD-10-CM | POA: Diagnosis not present

## 2018-08-25 DIAGNOSIS — E119 Type 2 diabetes mellitus without complications: Secondary | ICD-10-CM | POA: Diagnosis not present

## 2018-08-25 DIAGNOSIS — N2589 Other disorders resulting from impaired renal tubular function: Secondary | ICD-10-CM | POA: Diagnosis not present

## 2018-08-25 DIAGNOSIS — D631 Anemia in chronic kidney disease: Secondary | ICD-10-CM | POA: Diagnosis not present

## 2018-08-25 DIAGNOSIS — R82998 Other abnormal findings in urine: Secondary | ICD-10-CM | POA: Diagnosis not present

## 2018-08-25 DIAGNOSIS — N2581 Secondary hyperparathyroidism of renal origin: Secondary | ICD-10-CM | POA: Diagnosis not present

## 2018-08-26 DIAGNOSIS — R82998 Other abnormal findings in urine: Secondary | ICD-10-CM | POA: Diagnosis not present

## 2018-08-26 DIAGNOSIS — Z4932 Encounter for adequacy testing for peritoneal dialysis: Secondary | ICD-10-CM | POA: Diagnosis not present

## 2018-08-26 DIAGNOSIS — E119 Type 2 diabetes mellitus without complications: Secondary | ICD-10-CM | POA: Diagnosis not present

## 2018-08-26 DIAGNOSIS — D631 Anemia in chronic kidney disease: Secondary | ICD-10-CM | POA: Diagnosis not present

## 2018-08-26 DIAGNOSIS — D509 Iron deficiency anemia, unspecified: Secondary | ICD-10-CM | POA: Diagnosis not present

## 2018-08-26 DIAGNOSIS — E7849 Other hyperlipidemia: Secondary | ICD-10-CM | POA: Diagnosis not present

## 2018-08-26 DIAGNOSIS — N186 End stage renal disease: Secondary | ICD-10-CM | POA: Diagnosis not present

## 2018-08-26 DIAGNOSIS — N2589 Other disorders resulting from impaired renal tubular function: Secondary | ICD-10-CM | POA: Diagnosis not present

## 2018-08-26 DIAGNOSIS — N2581 Secondary hyperparathyroidism of renal origin: Secondary | ICD-10-CM | POA: Diagnosis not present

## 2018-08-27 DIAGNOSIS — N2581 Secondary hyperparathyroidism of renal origin: Secondary | ICD-10-CM | POA: Diagnosis not present

## 2018-08-27 DIAGNOSIS — D631 Anemia in chronic kidney disease: Secondary | ICD-10-CM | POA: Diagnosis not present

## 2018-08-27 DIAGNOSIS — E7849 Other hyperlipidemia: Secondary | ICD-10-CM | POA: Diagnosis not present

## 2018-08-27 DIAGNOSIS — Z4932 Encounter for adequacy testing for peritoneal dialysis: Secondary | ICD-10-CM | POA: Diagnosis not present

## 2018-08-27 DIAGNOSIS — R82998 Other abnormal findings in urine: Secondary | ICD-10-CM | POA: Diagnosis not present

## 2018-08-27 DIAGNOSIS — N186 End stage renal disease: Secondary | ICD-10-CM | POA: Diagnosis not present

## 2018-08-27 DIAGNOSIS — E119 Type 2 diabetes mellitus without complications: Secondary | ICD-10-CM | POA: Diagnosis not present

## 2018-08-27 DIAGNOSIS — D509 Iron deficiency anemia, unspecified: Secondary | ICD-10-CM | POA: Diagnosis not present

## 2018-08-27 DIAGNOSIS — N2589 Other disorders resulting from impaired renal tubular function: Secondary | ICD-10-CM | POA: Diagnosis not present

## 2018-08-28 DIAGNOSIS — Z4932 Encounter for adequacy testing for peritoneal dialysis: Secondary | ICD-10-CM | POA: Diagnosis not present

## 2018-08-28 DIAGNOSIS — D631 Anemia in chronic kidney disease: Secondary | ICD-10-CM | POA: Diagnosis not present

## 2018-08-28 DIAGNOSIS — D509 Iron deficiency anemia, unspecified: Secondary | ICD-10-CM | POA: Diagnosis not present

## 2018-08-28 DIAGNOSIS — N2589 Other disorders resulting from impaired renal tubular function: Secondary | ICD-10-CM | POA: Diagnosis not present

## 2018-08-28 DIAGNOSIS — E119 Type 2 diabetes mellitus without complications: Secondary | ICD-10-CM | POA: Diagnosis not present

## 2018-08-28 DIAGNOSIS — E7849 Other hyperlipidemia: Secondary | ICD-10-CM | POA: Diagnosis not present

## 2018-08-28 DIAGNOSIS — N186 End stage renal disease: Secondary | ICD-10-CM | POA: Diagnosis not present

## 2018-08-28 DIAGNOSIS — R82998 Other abnormal findings in urine: Secondary | ICD-10-CM | POA: Diagnosis not present

## 2018-08-28 DIAGNOSIS — N2581 Secondary hyperparathyroidism of renal origin: Secondary | ICD-10-CM | POA: Diagnosis not present

## 2018-08-29 DIAGNOSIS — N186 End stage renal disease: Secondary | ICD-10-CM | POA: Diagnosis not present

## 2018-08-29 DIAGNOSIS — E119 Type 2 diabetes mellitus without complications: Secondary | ICD-10-CM | POA: Diagnosis not present

## 2018-08-29 DIAGNOSIS — E7849 Other hyperlipidemia: Secondary | ICD-10-CM | POA: Diagnosis not present

## 2018-08-29 DIAGNOSIS — D509 Iron deficiency anemia, unspecified: Secondary | ICD-10-CM | POA: Diagnosis not present

## 2018-08-29 DIAGNOSIS — Z4932 Encounter for adequacy testing for peritoneal dialysis: Secondary | ICD-10-CM | POA: Diagnosis not present

## 2018-08-29 DIAGNOSIS — N2581 Secondary hyperparathyroidism of renal origin: Secondary | ICD-10-CM | POA: Diagnosis not present

## 2018-08-29 DIAGNOSIS — R82998 Other abnormal findings in urine: Secondary | ICD-10-CM | POA: Diagnosis not present

## 2018-08-29 DIAGNOSIS — N2589 Other disorders resulting from impaired renal tubular function: Secondary | ICD-10-CM | POA: Diagnosis not present

## 2018-08-29 DIAGNOSIS — D631 Anemia in chronic kidney disease: Secondary | ICD-10-CM | POA: Diagnosis not present

## 2018-08-29 NOTE — Patient Instructions (Signed)

## 2018-08-29 NOTE — Progress Notes (Signed)
This very nice 77 y.o.  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      Patient is treated for HTN  (1990's) & BP has been controlled at home. Today's BP is elevated at 152/84. She relates that her peritoneal dialysis did not drain last night. Patient has had no complaints of any cardiac type chest pain, palpitations, dyspnea / orthopnea / PND, dizziness, claudication, or dependent edema. Patient has hx/o Gout controlled on her meds.      Hyperlipidemia is not controlled with diet & meds. Patient denies myalgias or other med SE's. Last Lipids were not at goal: Lab Results  Component Value Date   CHOL 213 (H) 05/30/2018   HDL 46 (L) 05/30/2018   LDLCALC 123 (H) 05/30/2018   TRIG 289 (H) 05/30/2018   CHOLHDL 4.6 05/30/2018      Also, the patient has history of T2_NIDDM  (A1c 6.6%/1999) controlling w/diet.  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  She has had no symptoms of reactive hypoglycemia, diabetic polys, paresthesias or visual blurring.  Last A1c was Normal & at goal: Lab Results  Component Value Date   HGBA1C 5.6 05/30/2018      Further, the patient also has history of Vitamin D Deficiency ("39"/2014 & "31"/2016) . Last vitamin D was  Still low: Lab Results  Component Value Date   VD25OH 31 02/14/2018   Current Outpatient Medications on File Prior to Visit  Medication Sig  . allopurinol (ZYLOPRIM) 300 MG tablet TAKE 1/2 TO 1 TABLET BY  MOUTH DAILY AS DIRECTED  . aspirin 81 MG tablet Take 81 mg by mouth at bedtime.   Marland Kitchen b complex-vitamin c-folic acid (NEPHRO-VITE) 0.8 MG TABS tablet Take 1 tablet by mouth at bedtime.  . calcitRIOL (ROCALTROL) 0.5 MCG capsule Take 0.5 mcg by mouth daily.   . calcium acetate (PHOSLO) 667 MG capsule Take 667 mg by mouth 3 (three) times daily with meals.   . Cholecalciferol (VITAMIN D-3 PO) Take 5,000 Units by mouth every  other day.   . cinacalcet (SENSIPAR) 30 MG tablet Take 30 mg by mouth 2 (two) times daily.   Marland Kitchen docusate sodium (COLACE) 100 MG capsule Take 100 mg by mouth 2 (two) times daily.  . Flaxseed, Linseed, (FLAXSEED OIL) 1000 MG CAPS Take 1 capsule by mouth daily.   Marland Kitchen glucose blood (ACCU-CHEK AVIVA PLUS) test strip Test once daily  . hydrALAZINE (APRESOLINE) 10 MG tablet Take 1 tablet (10 mg total) by mouth 2 (two) times daily. PT IS OUT OF MEDICATION-PLEASE SEND OVERNIGHT  . labetalol (NORMODYNE) 200 MG tablet TAKE 1 TABLET BY MOUTH TWO  TIMES DAILY  . Omega-3 Fatty Acids (FISH OIL) 1200 MG CAPS Take 2 capsules by mouth daily.  . sevelamer carbonate (RENVELA) 800 MG tablet Take 1,600 mg by mouth 3 (three) times daily with meals.   . zinc gluconate 50 MG tablet Take 50 mg by mouth every morning.  . rosuvastatin (CRESTOR) 40 MG tablet Take 1/2 to 1 tablet daily or as directed for Cholesterol (Patient not taking: Reported on 08/30/2018)   No current facility-administered medications on file prior to visit.    Allergies  Allergen Reactions  . Ace Inhibitors     unknown  . Nsaids     unknown   PMHx:   Past Medical History:  Diagnosis Date  . Anemia   .  Arthritis    Osteoarthritis  . Chronic kidney disease    Chronic renal insufficiency  . Diabetes mellitus    Pre  . Diverticulosis   . GERD (gastroesophageal reflux disease)   . Gout   . Hiatal hernia   . History of blood transfusion   . Hyperlipidemia   . Hypertension   . Osteopenia   . Pancreatic cyst 1999  . Thyroid disease    Hyperparathyroid    Immunization History  Administered Date(s) Administered  . DT 02/05/2014  . Influenza, High Dose Seasonal PF 09/25/2013  . Influenza-Unspecified 08/11/2015, 07/21/2017  . Pneumococcal Polysaccharide-23 11/20/2009  . Td 11/21/2003  . Zoster 11/20/2005   Past Surgical History:  Procedure Laterality Date  . CHOLECYSTECTOMY    . COLONOSCOPY  03/21/12   Next one in 2018  . EYE SURGERY  Left    cataract extraction with IOL  . INSERTION OF DIALYSIS CATHETER N/A 02/15/2017   Procedure: REVISION OF DIALYSIS CATHETER;  Surgeon: Algernon Huxley, MD;  Location: [facility] ORS;  Service: Cardiovascular;  Laterality: N/A;  . JOINT REPLACEMENT  2012   left knee  . left knee replacement     . PANCREATIC CYST EXCISION  1999  . TOTAL HIP ARTHROPLASTY Right 05/26/2015   Procedure: RIGHT TOTAL HIP ARTHROPLASTY ANTERIOR APPROACH;  Surgeon: Gaynelle Arabian, MD;  Location: [facility];  Service: Orthopedics;  Laterality: Right;  . TOTAL KNEE ARTHROPLASTY Right 06/09/2013   Procedure: RIGHT TOTAL KNEE ARTHROPLASTY;  Surgeon: Gearlean Alf, MD;  Location: [facility];  Service: Orthopedics;  Laterality: Right;   FHx:    Reviewed / unchanged  SHx:    Reviewed / unchanged   Systems Review:  Constitutional: Denies fever, chills, wt changes, headaches, insomnia, fatigue, night sweats, change in appetite. Eyes: Denies redness, blurred vision, diplopia, discharge, itchy, watery eyes.  ENT: Denies discharge, congestion, post nasal drip, epistaxis, sore throat, earache, hearing loss, dental pain, tinnitus, vertigo, sinus pain, snoring.  CV: Denies chest pain, palpitations, irregular heartbeat, syncope, dyspnea, diaphoresis, orthopnea, PND, claudication or edema. Respiratory: denies cough, dyspnea, DOE, pleurisy, hoarseness, laryngitis, wheezing.  Gastrointestinal: Denies dysphagia, odynophagia, heartburn, reflux, water brash, abdominal pain or cramps, nausea, vomiting, bloating, diarrhea, constipation, hematemesis, melena, hematochezia  or hemorrhoids. Genitourinary: Denies dysuria, frequency, urgency, nocturia, hesitancy, discharge, hematuria or flank pain. Musculoskeletal: Denies arthralgias, myalgias, stiffness, jt. swelling, pain, limping or strain/sprain.  Skin: Denies pruritus, rash, hives, warts, acne, eczema or change in skin lesion(s). Neuro: No weakness, tremor, incoordination, spasms, paresthesia or  pain. Psychiatric: Denies confusion, memory loss or sensory loss. Endo: Denies change in weight, skin or hair change.  Heme/Lymph: No excessive bleeding, bruising or enlarged lymph nodes.  Physical Exam  BP (!) 152/84   Pulse 80   Temp (!) 97.5 F (36.4 C)   Resp 16   Ht 5\' 2"  (1.575 m)   Wt 157 lb (71.2 kg)   BMI 28.72 kg/m   Appears  well nourished, well groomed  and in no distress.  Eyes: PERRLA, EOMs, conjunctiva no swelling or erythema. Sinuses: No frontal/maxillary tenderness ENT/Mouth: EAC's clear, TM's nl w/o erythema, bulging. Nares clear w/o erythema, swelling, exudates. Oropharynx clear without erythema or exudates. Oral hygiene is good. Tongue normal, non obstructing. Hearing intact.  Neck: Supple. Thyroid not palpable. Car 2+/2+ without bruits, nodes or JVD. Chest: Respirations nl with BS clear & equal w/o rales, rhonchi, wheezing or stridor.  Cor: Heart sounds normal w/ regular rate and rhythm without sig. murmurs, gallops, clicks  or rubs. Peripheral pulses normal and equal  without edema.  Abdomen: Soft & bowel sounds normal. Non-tender w/o guarding, rebound, hernias, masses or organomegaly.  Lymphatics: Unremarkable.  Musculoskeletal: Full ROM all peripheral extremities, joint stability, 5/5 strength and normal gait.  Skin: Warm, dry without exposed rashes, lesions or ecchymosis apparent.  Neuro: Cranial nerves intact, reflexes equal bilaterally. Sensory-motor testing grossly intact. Tendon reflexes grossly intact.  Pysch: Alert & oriented x 3.  Insight and judgement nl & appropriate. No ideations.  Assessment and Plan:  1. Essential hypertension  - Continue medication, monitor blood pressure at home.  - Continue DASH diet.  Reminder to go to the ER if any CP,  SOB, nausea, dizziness, severe HA, changes vision/speech.  - CBC with Differential/Platelet - COMPLETE METABOLIC PANEL WITH GFR - Magnesium - TSH  2. Hyperlipidemia, mixed  - Continue diet/meds,  exercise,& lifestyle modifications.  - Continue monitor periodic cholesterol/liver & renal functions   - Lipid panel - TSH  3. Type 2 diabetes mellitus with chronic kidney disease on chronic dialysis, without long-term current use of insulin (HCC)  - Continue diet, exercise,  - lifestyle modifications.  - Monitor appropriate labs.  - Hemoglobin A1c - Insulin, random  4. Vitamin D deficiency  - Continue supplementation.  - VITAMIN D 25 Hydroxyl  5. Secondary hyperparathyroidism (CKD)  - PTH, intact and calcium  6. Idiopathic gout, unspecified chronicity, unspecified site  - Uric acid  7. ESRD (end stage renal disease) ([place])  - CBC with Differential/Platelet - COMPLETE METABOLIC PANEL WITH GFR  8. Medication management  - CBC with Differential/Platelet - COMPLETE METABOLIC PANEL WITH GFR - Magnesium - Lipid panel - TSH - Hemoglobin A1c - Insulin, random - VITAMIN D 25 Hydroxyl - PTH, intact and calcium - Uric acid      Discussed  regular exercise, BP monitoring, weight control to achieve/maintain BMI less than 25 and discussed med and SE's. Recommended labs to assess and monitor clinical status with further disposition pending results of labs. Over 30 minutes of exam, counseling, chart review was performed.

## 2018-08-30 ENCOUNTER — Ambulatory Visit (INDEPENDENT_AMBULATORY_CARE_PROVIDER_SITE_OTHER): Payer: Medicare Other | Admitting: Internal Medicine

## 2018-08-30 ENCOUNTER — Encounter: Payer: Self-pay | Admitting: Internal Medicine

## 2018-08-30 VITALS — BP 152/84 | HR 80 | Temp 97.5°F | Resp 16 | Ht 62.0 in | Wt 157.0 lb

## 2018-08-30 DIAGNOSIS — E559 Vitamin D deficiency, unspecified: Secondary | ICD-10-CM

## 2018-08-30 DIAGNOSIS — E7849 Other hyperlipidemia: Secondary | ICD-10-CM | POA: Diagnosis not present

## 2018-08-30 DIAGNOSIS — R82998 Other abnormal findings in urine: Secondary | ICD-10-CM | POA: Diagnosis not present

## 2018-08-30 DIAGNOSIS — E1122 Type 2 diabetes mellitus with diabetic chronic kidney disease: Secondary | ICD-10-CM

## 2018-08-30 DIAGNOSIS — D509 Iron deficiency anemia, unspecified: Secondary | ICD-10-CM | POA: Diagnosis not present

## 2018-08-30 DIAGNOSIS — I10 Essential (primary) hypertension: Secondary | ICD-10-CM

## 2018-08-30 DIAGNOSIS — N2589 Other disorders resulting from impaired renal tubular function: Secondary | ICD-10-CM | POA: Diagnosis not present

## 2018-08-30 DIAGNOSIS — D631 Anemia in chronic kidney disease: Secondary | ICD-10-CM | POA: Diagnosis not present

## 2018-08-30 DIAGNOSIS — Z79899 Other long term (current) drug therapy: Secondary | ICD-10-CM

## 2018-08-30 DIAGNOSIS — N186 End stage renal disease: Secondary | ICD-10-CM

## 2018-08-30 DIAGNOSIS — N2581 Secondary hyperparathyroidism of renal origin: Secondary | ICD-10-CM

## 2018-08-30 DIAGNOSIS — Z4932 Encounter for adequacy testing for peritoneal dialysis: Secondary | ICD-10-CM | POA: Diagnosis not present

## 2018-08-30 DIAGNOSIS — M1000 Idiopathic gout, unspecified site: Secondary | ICD-10-CM

## 2018-08-30 DIAGNOSIS — Z992 Dependence on renal dialysis: Secondary | ICD-10-CM

## 2018-08-30 DIAGNOSIS — E782 Mixed hyperlipidemia: Secondary | ICD-10-CM | POA: Diagnosis not present

## 2018-08-30 DIAGNOSIS — E119 Type 2 diabetes mellitus without complications: Secondary | ICD-10-CM | POA: Diagnosis not present

## 2018-08-31 DIAGNOSIS — Z4932 Encounter for adequacy testing for peritoneal dialysis: Secondary | ICD-10-CM | POA: Diagnosis not present

## 2018-08-31 DIAGNOSIS — N2581 Secondary hyperparathyroidism of renal origin: Secondary | ICD-10-CM | POA: Diagnosis not present

## 2018-08-31 DIAGNOSIS — N2589 Other disorders resulting from impaired renal tubular function: Secondary | ICD-10-CM | POA: Diagnosis not present

## 2018-08-31 DIAGNOSIS — D631 Anemia in chronic kidney disease: Secondary | ICD-10-CM | POA: Diagnosis not present

## 2018-08-31 DIAGNOSIS — E7849 Other hyperlipidemia: Secondary | ICD-10-CM | POA: Diagnosis not present

## 2018-08-31 DIAGNOSIS — N186 End stage renal disease: Secondary | ICD-10-CM | POA: Diagnosis not present

## 2018-08-31 DIAGNOSIS — D509 Iron deficiency anemia, unspecified: Secondary | ICD-10-CM | POA: Diagnosis not present

## 2018-08-31 DIAGNOSIS — R82998 Other abnormal findings in urine: Secondary | ICD-10-CM | POA: Diagnosis not present

## 2018-08-31 DIAGNOSIS — E119 Type 2 diabetes mellitus without complications: Secondary | ICD-10-CM | POA: Diagnosis not present

## 2018-09-01 ENCOUNTER — Encounter: Payer: Self-pay | Admitting: Internal Medicine

## 2018-09-01 DIAGNOSIS — Z4932 Encounter for adequacy testing for peritoneal dialysis: Secondary | ICD-10-CM | POA: Diagnosis not present

## 2018-09-01 DIAGNOSIS — E7849 Other hyperlipidemia: Secondary | ICD-10-CM | POA: Diagnosis not present

## 2018-09-01 DIAGNOSIS — N186 End stage renal disease: Secondary | ICD-10-CM | POA: Diagnosis not present

## 2018-09-01 DIAGNOSIS — N2581 Secondary hyperparathyroidism of renal origin: Secondary | ICD-10-CM | POA: Diagnosis not present

## 2018-09-01 DIAGNOSIS — D631 Anemia in chronic kidney disease: Secondary | ICD-10-CM | POA: Diagnosis not present

## 2018-09-01 DIAGNOSIS — E119 Type 2 diabetes mellitus without complications: Secondary | ICD-10-CM | POA: Diagnosis not present

## 2018-09-01 DIAGNOSIS — D509 Iron deficiency anemia, unspecified: Secondary | ICD-10-CM | POA: Diagnosis not present

## 2018-09-01 DIAGNOSIS — N2589 Other disorders resulting from impaired renal tubular function: Secondary | ICD-10-CM | POA: Diagnosis not present

## 2018-09-01 DIAGNOSIS — R82998 Other abnormal findings in urine: Secondary | ICD-10-CM | POA: Diagnosis not present

## 2018-09-02 DIAGNOSIS — Z4932 Encounter for adequacy testing for peritoneal dialysis: Secondary | ICD-10-CM | POA: Diagnosis not present

## 2018-09-02 DIAGNOSIS — E7849 Other hyperlipidemia: Secondary | ICD-10-CM | POA: Diagnosis not present

## 2018-09-02 DIAGNOSIS — D509 Iron deficiency anemia, unspecified: Secondary | ICD-10-CM | POA: Diagnosis not present

## 2018-09-02 DIAGNOSIS — N186 End stage renal disease: Secondary | ICD-10-CM | POA: Diagnosis not present

## 2018-09-02 DIAGNOSIS — N2589 Other disorders resulting from impaired renal tubular function: Secondary | ICD-10-CM | POA: Diagnosis not present

## 2018-09-02 DIAGNOSIS — R82998 Other abnormal findings in urine: Secondary | ICD-10-CM | POA: Diagnosis not present

## 2018-09-02 DIAGNOSIS — D631 Anemia in chronic kidney disease: Secondary | ICD-10-CM | POA: Diagnosis not present

## 2018-09-02 DIAGNOSIS — E119 Type 2 diabetes mellitus without complications: Secondary | ICD-10-CM | POA: Diagnosis not present

## 2018-09-02 DIAGNOSIS — N2581 Secondary hyperparathyroidism of renal origin: Secondary | ICD-10-CM | POA: Diagnosis not present

## 2018-09-02 LAB — CBC WITH DIFFERENTIAL/PLATELET
BASOS PCT: 0.3 %
Basophils Absolute: 26 cells/uL (ref 0–200)
EOS ABS: 331 {cells}/uL (ref 15–500)
EOS PCT: 3.8 %
HEMATOCRIT: 30.4 % — AB (ref 35.0–45.0)
HEMOGLOBIN: 10.2 g/dL — AB (ref 11.7–15.5)
LYMPHS ABS: 2036 {cells}/uL (ref 850–3900)
MCH: 30.7 pg (ref 27.0–33.0)
MCHC: 33.6 g/dL (ref 32.0–36.0)
MCV: 91.6 fL (ref 80.0–100.0)
MONOS PCT: 5.7 %
MPV: 10.3 fL (ref 7.5–12.5)
NEUTROS ABS: 5812 {cells}/uL (ref 1500–7800)
Neutrophils Relative %: 66.8 %
Platelets: 254 10*3/uL (ref 140–400)
RBC: 3.32 10*6/uL — ABNORMAL LOW (ref 3.80–5.10)
RDW: 14.5 % (ref 11.0–15.0)
Total Lymphocyte: 23.4 %
WBC mixed population: 496 cells/uL (ref 200–950)
WBC: 8.7 10*3/uL (ref 3.8–10.8)

## 2018-09-02 LAB — INSULIN, RANDOM: INSULIN: 7.6 u[IU]/mL (ref 2.0–19.6)

## 2018-09-02 LAB — VITAMIN D 25 HYDROXY (VIT D DEFICIENCY, FRACTURES): VIT D 25 HYDROXY: 23 ng/mL — AB (ref 30–100)

## 2018-09-02 LAB — COMPLETE METABOLIC PANEL WITH GFR
AG RATIO: 1.4 (calc) (ref 1.0–2.5)
ALT: 19 U/L (ref 6–29)
AST: 20 U/L (ref 10–35)
Albumin: 3.8 g/dL (ref 3.6–5.1)
Alkaline phosphatase (APISO): 223 U/L — ABNORMAL HIGH (ref 33–130)
BILIRUBIN TOTAL: 0.4 mg/dL (ref 0.2–1.2)
BUN / CREAT RATIO: 6 (calc) (ref 6–22)
BUN: 63 mg/dL — ABNORMAL HIGH (ref 7–25)
CHLORIDE: 95 mmol/L — AB (ref 98–110)
CO2: 27 mmol/L (ref 20–32)
Calcium: 9.1 mg/dL (ref 8.6–10.4)
Creat: 10.65 mg/dL — ABNORMAL HIGH (ref 0.60–0.93)
GFR, EST AFRICAN AMERICAN: 4 mL/min/{1.73_m2} — AB (ref >=60)
GFR, Est Non African American: 3 mL/min/{1.73_m2} — ABNORMAL LOW (ref >=60)
GLOBULIN: 2.7 g/dL (ref 1.9–3.7)
Glucose, Bld: 128 mg/dL — ABNORMAL HIGH (ref 65–99)
POTASSIUM: 3.7 mmol/L (ref 3.5–5.3)
SODIUM: 138 mmol/L (ref 135–146)
TOTAL PROTEIN: 6.5 g/dL (ref 6.1–8.1)

## 2018-09-02 LAB — LIPID PANEL
CHOL/HDL RATIO: 3.9 (calc) (ref <5.0)
Cholesterol: 205 mg/dL — ABNORMAL HIGH (ref <200)
HDL: 53 mg/dL (ref >50)
LDL Cholesterol (Calc): 112 mg/dL (calc) — ABNORMAL HIGH
NON-HDL CHOLESTEROL (CALC): 152 mg/dL — AB (ref <130)
Triglycerides: 280 mg/dL — ABNORMAL HIGH (ref <150)

## 2018-09-02 LAB — HEMOGLOBIN A1C
HEMOGLOBIN A1C: 6.2 %{Hb} — AB (ref <5.7)
Mean Plasma Glucose: 131 (calc)
eAG (mmol/L): 7.3 (calc)

## 2018-09-02 LAB — PTH, INTACT AND CALCIUM
CALCIUM: 9.1 mg/dL (ref 8.6–10.4)
PTH: 279 pg/mL — ABNORMAL HIGH (ref 14–64)

## 2018-09-02 LAB — URIC ACID: URIC ACID, SERUM: 2.9 mg/dL (ref 2.5–7.0)

## 2018-09-02 LAB — TSH: TSH: 2.66 m[IU]/L (ref 0.40–4.50)

## 2018-09-02 LAB — MAGNESIUM: Magnesium: 1.8 mg/dL (ref 1.5–2.5)

## 2018-09-03 DIAGNOSIS — D509 Iron deficiency anemia, unspecified: Secondary | ICD-10-CM | POA: Diagnosis not present

## 2018-09-03 DIAGNOSIS — N2589 Other disorders resulting from impaired renal tubular function: Secondary | ICD-10-CM | POA: Diagnosis not present

## 2018-09-03 DIAGNOSIS — N186 End stage renal disease: Secondary | ICD-10-CM | POA: Diagnosis not present

## 2018-09-03 DIAGNOSIS — E119 Type 2 diabetes mellitus without complications: Secondary | ICD-10-CM | POA: Diagnosis not present

## 2018-09-03 DIAGNOSIS — N2581 Secondary hyperparathyroidism of renal origin: Secondary | ICD-10-CM | POA: Diagnosis not present

## 2018-09-03 DIAGNOSIS — E7849 Other hyperlipidemia: Secondary | ICD-10-CM | POA: Diagnosis not present

## 2018-09-03 DIAGNOSIS — Z4932 Encounter for adequacy testing for peritoneal dialysis: Secondary | ICD-10-CM | POA: Diagnosis not present

## 2018-09-03 DIAGNOSIS — R82998 Other abnormal findings in urine: Secondary | ICD-10-CM | POA: Diagnosis not present

## 2018-09-03 DIAGNOSIS — D631 Anemia in chronic kidney disease: Secondary | ICD-10-CM | POA: Diagnosis not present

## 2018-09-04 DIAGNOSIS — N2589 Other disorders resulting from impaired renal tubular function: Secondary | ICD-10-CM | POA: Diagnosis not present

## 2018-09-04 DIAGNOSIS — N2581 Secondary hyperparathyroidism of renal origin: Secondary | ICD-10-CM | POA: Diagnosis not present

## 2018-09-04 DIAGNOSIS — R82998 Other abnormal findings in urine: Secondary | ICD-10-CM | POA: Diagnosis not present

## 2018-09-04 DIAGNOSIS — E7849 Other hyperlipidemia: Secondary | ICD-10-CM | POA: Diagnosis not present

## 2018-09-04 DIAGNOSIS — D631 Anemia in chronic kidney disease: Secondary | ICD-10-CM | POA: Diagnosis not present

## 2018-09-04 DIAGNOSIS — N186 End stage renal disease: Secondary | ICD-10-CM | POA: Diagnosis not present

## 2018-09-04 DIAGNOSIS — Z4932 Encounter for adequacy testing for peritoneal dialysis: Secondary | ICD-10-CM | POA: Diagnosis not present

## 2018-09-04 DIAGNOSIS — E119 Type 2 diabetes mellitus without complications: Secondary | ICD-10-CM | POA: Diagnosis not present

## 2018-09-04 DIAGNOSIS — D509 Iron deficiency anemia, unspecified: Secondary | ICD-10-CM | POA: Diagnosis not present

## 2018-09-05 DIAGNOSIS — D631 Anemia in chronic kidney disease: Secondary | ICD-10-CM | POA: Diagnosis not present

## 2018-09-05 DIAGNOSIS — Z4932 Encounter for adequacy testing for peritoneal dialysis: Secondary | ICD-10-CM | POA: Diagnosis not present

## 2018-09-05 DIAGNOSIS — E119 Type 2 diabetes mellitus without complications: Secondary | ICD-10-CM | POA: Diagnosis not present

## 2018-09-05 DIAGNOSIS — N2581 Secondary hyperparathyroidism of renal origin: Secondary | ICD-10-CM | POA: Diagnosis not present

## 2018-09-05 DIAGNOSIS — D509 Iron deficiency anemia, unspecified: Secondary | ICD-10-CM | POA: Diagnosis not present

## 2018-09-05 DIAGNOSIS — E7849 Other hyperlipidemia: Secondary | ICD-10-CM | POA: Diagnosis not present

## 2018-09-05 DIAGNOSIS — N2589 Other disorders resulting from impaired renal tubular function: Secondary | ICD-10-CM | POA: Diagnosis not present

## 2018-09-05 DIAGNOSIS — N186 End stage renal disease: Secondary | ICD-10-CM | POA: Diagnosis not present

## 2018-09-05 DIAGNOSIS — R82998 Other abnormal findings in urine: Secondary | ICD-10-CM | POA: Diagnosis not present

## 2018-09-06 DIAGNOSIS — E7849 Other hyperlipidemia: Secondary | ICD-10-CM | POA: Diagnosis not present

## 2018-09-06 DIAGNOSIS — N2581 Secondary hyperparathyroidism of renal origin: Secondary | ICD-10-CM | POA: Diagnosis not present

## 2018-09-06 DIAGNOSIS — Z4932 Encounter for adequacy testing for peritoneal dialysis: Secondary | ICD-10-CM | POA: Diagnosis not present

## 2018-09-06 DIAGNOSIS — N186 End stage renal disease: Secondary | ICD-10-CM | POA: Diagnosis not present

## 2018-09-06 DIAGNOSIS — D509 Iron deficiency anemia, unspecified: Secondary | ICD-10-CM | POA: Diagnosis not present

## 2018-09-06 DIAGNOSIS — N2589 Other disorders resulting from impaired renal tubular function: Secondary | ICD-10-CM | POA: Diagnosis not present

## 2018-09-06 DIAGNOSIS — E119 Type 2 diabetes mellitus without complications: Secondary | ICD-10-CM | POA: Diagnosis not present

## 2018-09-06 DIAGNOSIS — R82998 Other abnormal findings in urine: Secondary | ICD-10-CM | POA: Diagnosis not present

## 2018-09-06 DIAGNOSIS — D631 Anemia in chronic kidney disease: Secondary | ICD-10-CM | POA: Diagnosis not present

## 2018-09-07 DIAGNOSIS — N2581 Secondary hyperparathyroidism of renal origin: Secondary | ICD-10-CM | POA: Diagnosis not present

## 2018-09-07 DIAGNOSIS — E7849 Other hyperlipidemia: Secondary | ICD-10-CM | POA: Diagnosis not present

## 2018-09-07 DIAGNOSIS — D509 Iron deficiency anemia, unspecified: Secondary | ICD-10-CM | POA: Diagnosis not present

## 2018-09-07 DIAGNOSIS — D631 Anemia in chronic kidney disease: Secondary | ICD-10-CM | POA: Diagnosis not present

## 2018-09-07 DIAGNOSIS — R82998 Other abnormal findings in urine: Secondary | ICD-10-CM | POA: Diagnosis not present

## 2018-09-07 DIAGNOSIS — Z4932 Encounter for adequacy testing for peritoneal dialysis: Secondary | ICD-10-CM | POA: Diagnosis not present

## 2018-09-07 DIAGNOSIS — N186 End stage renal disease: Secondary | ICD-10-CM | POA: Diagnosis not present

## 2018-09-07 DIAGNOSIS — E119 Type 2 diabetes mellitus without complications: Secondary | ICD-10-CM | POA: Diagnosis not present

## 2018-09-07 DIAGNOSIS — N2589 Other disorders resulting from impaired renal tubular function: Secondary | ICD-10-CM | POA: Diagnosis not present

## 2018-09-08 DIAGNOSIS — N186 End stage renal disease: Secondary | ICD-10-CM | POA: Diagnosis not present

## 2018-09-08 DIAGNOSIS — R82998 Other abnormal findings in urine: Secondary | ICD-10-CM | POA: Diagnosis not present

## 2018-09-08 DIAGNOSIS — E119 Type 2 diabetes mellitus without complications: Secondary | ICD-10-CM | POA: Diagnosis not present

## 2018-09-08 DIAGNOSIS — N2581 Secondary hyperparathyroidism of renal origin: Secondary | ICD-10-CM | POA: Diagnosis not present

## 2018-09-08 DIAGNOSIS — N2589 Other disorders resulting from impaired renal tubular function: Secondary | ICD-10-CM | POA: Diagnosis not present

## 2018-09-08 DIAGNOSIS — E7849 Other hyperlipidemia: Secondary | ICD-10-CM | POA: Diagnosis not present

## 2018-09-08 DIAGNOSIS — D509 Iron deficiency anemia, unspecified: Secondary | ICD-10-CM | POA: Diagnosis not present

## 2018-09-08 DIAGNOSIS — D631 Anemia in chronic kidney disease: Secondary | ICD-10-CM | POA: Diagnosis not present

## 2018-09-08 DIAGNOSIS — Z4932 Encounter for adequacy testing for peritoneal dialysis: Secondary | ICD-10-CM | POA: Diagnosis not present

## 2018-09-09 DIAGNOSIS — E119 Type 2 diabetes mellitus without complications: Secondary | ICD-10-CM | POA: Diagnosis not present

## 2018-09-09 DIAGNOSIS — N2589 Other disorders resulting from impaired renal tubular function: Secondary | ICD-10-CM | POA: Diagnosis not present

## 2018-09-09 DIAGNOSIS — N2581 Secondary hyperparathyroidism of renal origin: Secondary | ICD-10-CM | POA: Diagnosis not present

## 2018-09-09 DIAGNOSIS — N186 End stage renal disease: Secondary | ICD-10-CM | POA: Diagnosis not present

## 2018-09-09 DIAGNOSIS — D631 Anemia in chronic kidney disease: Secondary | ICD-10-CM | POA: Diagnosis not present

## 2018-09-09 DIAGNOSIS — E7849 Other hyperlipidemia: Secondary | ICD-10-CM | POA: Diagnosis not present

## 2018-09-09 DIAGNOSIS — R82998 Other abnormal findings in urine: Secondary | ICD-10-CM | POA: Diagnosis not present

## 2018-09-09 DIAGNOSIS — Z4932 Encounter for adequacy testing for peritoneal dialysis: Secondary | ICD-10-CM | POA: Diagnosis not present

## 2018-09-09 DIAGNOSIS — D509 Iron deficiency anemia, unspecified: Secondary | ICD-10-CM | POA: Diagnosis not present

## 2018-09-10 DIAGNOSIS — Z4932 Encounter for adequacy testing for peritoneal dialysis: Secondary | ICD-10-CM | POA: Diagnosis not present

## 2018-09-10 DIAGNOSIS — N2581 Secondary hyperparathyroidism of renal origin: Secondary | ICD-10-CM | POA: Diagnosis not present

## 2018-09-10 DIAGNOSIS — E7849 Other hyperlipidemia: Secondary | ICD-10-CM | POA: Diagnosis not present

## 2018-09-10 DIAGNOSIS — N186 End stage renal disease: Secondary | ICD-10-CM | POA: Diagnosis not present

## 2018-09-10 DIAGNOSIS — D631 Anemia in chronic kidney disease: Secondary | ICD-10-CM | POA: Diagnosis not present

## 2018-09-10 DIAGNOSIS — R82998 Other abnormal findings in urine: Secondary | ICD-10-CM | POA: Diagnosis not present

## 2018-09-10 DIAGNOSIS — E119 Type 2 diabetes mellitus without complications: Secondary | ICD-10-CM | POA: Diagnosis not present

## 2018-09-10 DIAGNOSIS — N2589 Other disorders resulting from impaired renal tubular function: Secondary | ICD-10-CM | POA: Diagnosis not present

## 2018-09-10 DIAGNOSIS — D509 Iron deficiency anemia, unspecified: Secondary | ICD-10-CM | POA: Diagnosis not present

## 2018-09-11 DIAGNOSIS — N2589 Other disorders resulting from impaired renal tubular function: Secondary | ICD-10-CM | POA: Diagnosis not present

## 2018-09-11 DIAGNOSIS — D509 Iron deficiency anemia, unspecified: Secondary | ICD-10-CM | POA: Diagnosis not present

## 2018-09-11 DIAGNOSIS — D631 Anemia in chronic kidney disease: Secondary | ICD-10-CM | POA: Diagnosis not present

## 2018-09-11 DIAGNOSIS — N186 End stage renal disease: Secondary | ICD-10-CM | POA: Diagnosis not present

## 2018-09-11 DIAGNOSIS — E7849 Other hyperlipidemia: Secondary | ICD-10-CM | POA: Diagnosis not present

## 2018-09-11 DIAGNOSIS — E119 Type 2 diabetes mellitus without complications: Secondary | ICD-10-CM | POA: Diagnosis not present

## 2018-09-11 DIAGNOSIS — N2581 Secondary hyperparathyroidism of renal origin: Secondary | ICD-10-CM | POA: Diagnosis not present

## 2018-09-11 DIAGNOSIS — Z4932 Encounter for adequacy testing for peritoneal dialysis: Secondary | ICD-10-CM | POA: Diagnosis not present

## 2018-09-11 DIAGNOSIS — R82998 Other abnormal findings in urine: Secondary | ICD-10-CM | POA: Diagnosis not present

## 2018-09-12 DIAGNOSIS — D631 Anemia in chronic kidney disease: Secondary | ICD-10-CM | POA: Diagnosis not present

## 2018-09-12 DIAGNOSIS — R82998 Other abnormal findings in urine: Secondary | ICD-10-CM | POA: Diagnosis not present

## 2018-09-12 DIAGNOSIS — N2581 Secondary hyperparathyroidism of renal origin: Secondary | ICD-10-CM | POA: Diagnosis not present

## 2018-09-12 DIAGNOSIS — N186 End stage renal disease: Secondary | ICD-10-CM | POA: Diagnosis not present

## 2018-09-12 DIAGNOSIS — Z4932 Encounter for adequacy testing for peritoneal dialysis: Secondary | ICD-10-CM | POA: Diagnosis not present

## 2018-09-12 DIAGNOSIS — D509 Iron deficiency anemia, unspecified: Secondary | ICD-10-CM | POA: Diagnosis not present

## 2018-09-12 DIAGNOSIS — E119 Type 2 diabetes mellitus without complications: Secondary | ICD-10-CM | POA: Diagnosis not present

## 2018-09-12 DIAGNOSIS — N2589 Other disorders resulting from impaired renal tubular function: Secondary | ICD-10-CM | POA: Diagnosis not present

## 2018-09-12 DIAGNOSIS — E7849 Other hyperlipidemia: Secondary | ICD-10-CM | POA: Diagnosis not present

## 2018-09-13 DIAGNOSIS — D509 Iron deficiency anemia, unspecified: Secondary | ICD-10-CM | POA: Diagnosis not present

## 2018-09-13 DIAGNOSIS — N186 End stage renal disease: Secondary | ICD-10-CM | POA: Diagnosis not present

## 2018-09-13 DIAGNOSIS — Z4932 Encounter for adequacy testing for peritoneal dialysis: Secondary | ICD-10-CM | POA: Diagnosis not present

## 2018-09-13 DIAGNOSIS — R82998 Other abnormal findings in urine: Secondary | ICD-10-CM | POA: Diagnosis not present

## 2018-09-13 DIAGNOSIS — N2581 Secondary hyperparathyroidism of renal origin: Secondary | ICD-10-CM | POA: Diagnosis not present

## 2018-09-13 DIAGNOSIS — E119 Type 2 diabetes mellitus without complications: Secondary | ICD-10-CM | POA: Diagnosis not present

## 2018-09-13 DIAGNOSIS — E7849 Other hyperlipidemia: Secondary | ICD-10-CM | POA: Diagnosis not present

## 2018-09-13 DIAGNOSIS — N2589 Other disorders resulting from impaired renal tubular function: Secondary | ICD-10-CM | POA: Diagnosis not present

## 2018-09-13 DIAGNOSIS — D631 Anemia in chronic kidney disease: Secondary | ICD-10-CM | POA: Diagnosis not present

## 2018-09-14 DIAGNOSIS — E7849 Other hyperlipidemia: Secondary | ICD-10-CM | POA: Diagnosis not present

## 2018-09-14 DIAGNOSIS — N186 End stage renal disease: Secondary | ICD-10-CM | POA: Diagnosis not present

## 2018-09-14 DIAGNOSIS — N2581 Secondary hyperparathyroidism of renal origin: Secondary | ICD-10-CM | POA: Diagnosis not present

## 2018-09-14 DIAGNOSIS — E119 Type 2 diabetes mellitus without complications: Secondary | ICD-10-CM | POA: Diagnosis not present

## 2018-09-14 DIAGNOSIS — D509 Iron deficiency anemia, unspecified: Secondary | ICD-10-CM | POA: Diagnosis not present

## 2018-09-14 DIAGNOSIS — D631 Anemia in chronic kidney disease: Secondary | ICD-10-CM | POA: Diagnosis not present

## 2018-09-14 DIAGNOSIS — Z4932 Encounter for adequacy testing for peritoneal dialysis: Secondary | ICD-10-CM | POA: Diagnosis not present

## 2018-09-14 DIAGNOSIS — R82998 Other abnormal findings in urine: Secondary | ICD-10-CM | POA: Diagnosis not present

## 2018-09-14 DIAGNOSIS — N2589 Other disorders resulting from impaired renal tubular function: Secondary | ICD-10-CM | POA: Diagnosis not present

## 2018-09-15 DIAGNOSIS — N2589 Other disorders resulting from impaired renal tubular function: Secondary | ICD-10-CM | POA: Diagnosis not present

## 2018-09-15 DIAGNOSIS — D509 Iron deficiency anemia, unspecified: Secondary | ICD-10-CM | POA: Diagnosis not present

## 2018-09-15 DIAGNOSIS — E7849 Other hyperlipidemia: Secondary | ICD-10-CM | POA: Diagnosis not present

## 2018-09-15 DIAGNOSIS — R82998 Other abnormal findings in urine: Secondary | ICD-10-CM | POA: Diagnosis not present

## 2018-09-15 DIAGNOSIS — N2581 Secondary hyperparathyroidism of renal origin: Secondary | ICD-10-CM | POA: Diagnosis not present

## 2018-09-15 DIAGNOSIS — Z4932 Encounter for adequacy testing for peritoneal dialysis: Secondary | ICD-10-CM | POA: Diagnosis not present

## 2018-09-15 DIAGNOSIS — D631 Anemia in chronic kidney disease: Secondary | ICD-10-CM | POA: Diagnosis not present

## 2018-09-15 DIAGNOSIS — E119 Type 2 diabetes mellitus without complications: Secondary | ICD-10-CM | POA: Diagnosis not present

## 2018-09-15 DIAGNOSIS — N186 End stage renal disease: Secondary | ICD-10-CM | POA: Diagnosis not present

## 2018-09-16 DIAGNOSIS — E119 Type 2 diabetes mellitus without complications: Secondary | ICD-10-CM | POA: Diagnosis not present

## 2018-09-16 DIAGNOSIS — N186 End stage renal disease: Secondary | ICD-10-CM | POA: Diagnosis not present

## 2018-09-16 DIAGNOSIS — D631 Anemia in chronic kidney disease: Secondary | ICD-10-CM | POA: Diagnosis not present

## 2018-09-16 DIAGNOSIS — R82998 Other abnormal findings in urine: Secondary | ICD-10-CM | POA: Diagnosis not present

## 2018-09-16 DIAGNOSIS — N2581 Secondary hyperparathyroidism of renal origin: Secondary | ICD-10-CM | POA: Diagnosis not present

## 2018-09-16 DIAGNOSIS — E7849 Other hyperlipidemia: Secondary | ICD-10-CM | POA: Diagnosis not present

## 2018-09-16 DIAGNOSIS — N2589 Other disorders resulting from impaired renal tubular function: Secondary | ICD-10-CM | POA: Diagnosis not present

## 2018-09-16 DIAGNOSIS — Z4932 Encounter for adequacy testing for peritoneal dialysis: Secondary | ICD-10-CM | POA: Diagnosis not present

## 2018-09-16 DIAGNOSIS — D509 Iron deficiency anemia, unspecified: Secondary | ICD-10-CM | POA: Diagnosis not present

## 2018-09-17 DIAGNOSIS — D509 Iron deficiency anemia, unspecified: Secondary | ICD-10-CM | POA: Diagnosis not present

## 2018-09-17 DIAGNOSIS — R82998 Other abnormal findings in urine: Secondary | ICD-10-CM | POA: Diagnosis not present

## 2018-09-17 DIAGNOSIS — N2589 Other disorders resulting from impaired renal tubular function: Secondary | ICD-10-CM | POA: Diagnosis not present

## 2018-09-17 DIAGNOSIS — N186 End stage renal disease: Secondary | ICD-10-CM | POA: Diagnosis not present

## 2018-09-17 DIAGNOSIS — E119 Type 2 diabetes mellitus without complications: Secondary | ICD-10-CM | POA: Diagnosis not present

## 2018-09-17 DIAGNOSIS — E7849 Other hyperlipidemia: Secondary | ICD-10-CM | POA: Diagnosis not present

## 2018-09-17 DIAGNOSIS — Z4932 Encounter for adequacy testing for peritoneal dialysis: Secondary | ICD-10-CM | POA: Diagnosis not present

## 2018-09-17 DIAGNOSIS — D631 Anemia in chronic kidney disease: Secondary | ICD-10-CM | POA: Diagnosis not present

## 2018-09-17 DIAGNOSIS — N2581 Secondary hyperparathyroidism of renal origin: Secondary | ICD-10-CM | POA: Diagnosis not present

## 2018-09-18 DIAGNOSIS — E7849 Other hyperlipidemia: Secondary | ICD-10-CM | POA: Diagnosis not present

## 2018-09-18 DIAGNOSIS — E119 Type 2 diabetes mellitus without complications: Secondary | ICD-10-CM | POA: Diagnosis not present

## 2018-09-18 DIAGNOSIS — D509 Iron deficiency anemia, unspecified: Secondary | ICD-10-CM | POA: Diagnosis not present

## 2018-09-18 DIAGNOSIS — N2581 Secondary hyperparathyroidism of renal origin: Secondary | ICD-10-CM | POA: Diagnosis not present

## 2018-09-18 DIAGNOSIS — D631 Anemia in chronic kidney disease: Secondary | ICD-10-CM | POA: Diagnosis not present

## 2018-09-18 DIAGNOSIS — N186 End stage renal disease: Secondary | ICD-10-CM | POA: Diagnosis not present

## 2018-09-18 DIAGNOSIS — R82998 Other abnormal findings in urine: Secondary | ICD-10-CM | POA: Diagnosis not present

## 2018-09-18 DIAGNOSIS — N2589 Other disorders resulting from impaired renal tubular function: Secondary | ICD-10-CM | POA: Diagnosis not present

## 2018-09-18 DIAGNOSIS — Z4932 Encounter for adequacy testing for peritoneal dialysis: Secondary | ICD-10-CM | POA: Diagnosis not present

## 2018-09-19 DIAGNOSIS — N2589 Other disorders resulting from impaired renal tubular function: Secondary | ICD-10-CM | POA: Diagnosis not present

## 2018-09-19 DIAGNOSIS — D509 Iron deficiency anemia, unspecified: Secondary | ICD-10-CM | POA: Diagnosis not present

## 2018-09-19 DIAGNOSIS — Z992 Dependence on renal dialysis: Secondary | ICD-10-CM | POA: Diagnosis not present

## 2018-09-19 DIAGNOSIS — E1122 Type 2 diabetes mellitus with diabetic chronic kidney disease: Secondary | ICD-10-CM | POA: Diagnosis not present

## 2018-09-19 DIAGNOSIS — N2581 Secondary hyperparathyroidism of renal origin: Secondary | ICD-10-CM | POA: Diagnosis not present

## 2018-09-19 DIAGNOSIS — R82998 Other abnormal findings in urine: Secondary | ICD-10-CM | POA: Diagnosis not present

## 2018-09-19 DIAGNOSIS — E119 Type 2 diabetes mellitus without complications: Secondary | ICD-10-CM | POA: Diagnosis not present

## 2018-09-19 DIAGNOSIS — E7849 Other hyperlipidemia: Secondary | ICD-10-CM | POA: Diagnosis not present

## 2018-09-19 DIAGNOSIS — D631 Anemia in chronic kidney disease: Secondary | ICD-10-CM | POA: Diagnosis not present

## 2018-09-19 DIAGNOSIS — Z4932 Encounter for adequacy testing for peritoneal dialysis: Secondary | ICD-10-CM | POA: Diagnosis not present

## 2018-09-19 DIAGNOSIS — N186 End stage renal disease: Secondary | ICD-10-CM | POA: Diagnosis not present

## 2018-09-20 DIAGNOSIS — N2581 Secondary hyperparathyroidism of renal origin: Secondary | ICD-10-CM | POA: Diagnosis not present

## 2018-09-20 DIAGNOSIS — Z79899 Other long term (current) drug therapy: Secondary | ICD-10-CM | POA: Diagnosis not present

## 2018-09-20 DIAGNOSIS — D509 Iron deficiency anemia, unspecified: Secondary | ICD-10-CM | POA: Diagnosis not present

## 2018-09-20 DIAGNOSIS — E440 Moderate protein-calorie malnutrition: Secondary | ICD-10-CM | POA: Diagnosis not present

## 2018-09-20 DIAGNOSIS — R82998 Other abnormal findings in urine: Secondary | ICD-10-CM | POA: Diagnosis not present

## 2018-09-20 DIAGNOSIS — Z23 Encounter for immunization: Secondary | ICD-10-CM | POA: Diagnosis not present

## 2018-09-20 DIAGNOSIS — R17 Unspecified jaundice: Secondary | ICD-10-CM | POA: Diagnosis not present

## 2018-09-20 DIAGNOSIS — N186 End stage renal disease: Secondary | ICD-10-CM | POA: Diagnosis not present

## 2018-09-20 DIAGNOSIS — D631 Anemia in chronic kidney disease: Secondary | ICD-10-CM | POA: Diagnosis not present

## 2018-09-21 DIAGNOSIS — Z23 Encounter for immunization: Secondary | ICD-10-CM | POA: Diagnosis not present

## 2018-09-21 DIAGNOSIS — D631 Anemia in chronic kidney disease: Secondary | ICD-10-CM | POA: Diagnosis not present

## 2018-09-21 DIAGNOSIS — E440 Moderate protein-calorie malnutrition: Secondary | ICD-10-CM | POA: Diagnosis not present

## 2018-09-21 DIAGNOSIS — Z79899 Other long term (current) drug therapy: Secondary | ICD-10-CM | POA: Diagnosis not present

## 2018-09-21 DIAGNOSIS — R17 Unspecified jaundice: Secondary | ICD-10-CM | POA: Diagnosis not present

## 2018-09-21 DIAGNOSIS — D509 Iron deficiency anemia, unspecified: Secondary | ICD-10-CM | POA: Diagnosis not present

## 2018-09-21 DIAGNOSIS — N2581 Secondary hyperparathyroidism of renal origin: Secondary | ICD-10-CM | POA: Diagnosis not present

## 2018-09-21 DIAGNOSIS — R82998 Other abnormal findings in urine: Secondary | ICD-10-CM | POA: Diagnosis not present

## 2018-09-21 DIAGNOSIS — N186 End stage renal disease: Secondary | ICD-10-CM | POA: Diagnosis not present

## 2018-09-22 DIAGNOSIS — Z23 Encounter for immunization: Secondary | ICD-10-CM | POA: Diagnosis not present

## 2018-09-22 DIAGNOSIS — R17 Unspecified jaundice: Secondary | ICD-10-CM | POA: Diagnosis not present

## 2018-09-22 DIAGNOSIS — E440 Moderate protein-calorie malnutrition: Secondary | ICD-10-CM | POA: Diagnosis not present

## 2018-09-22 DIAGNOSIS — D509 Iron deficiency anemia, unspecified: Secondary | ICD-10-CM | POA: Diagnosis not present

## 2018-09-22 DIAGNOSIS — R82998 Other abnormal findings in urine: Secondary | ICD-10-CM | POA: Diagnosis not present

## 2018-09-22 DIAGNOSIS — N186 End stage renal disease: Secondary | ICD-10-CM | POA: Diagnosis not present

## 2018-09-22 DIAGNOSIS — D631 Anemia in chronic kidney disease: Secondary | ICD-10-CM | POA: Diagnosis not present

## 2018-09-22 DIAGNOSIS — N2581 Secondary hyperparathyroidism of renal origin: Secondary | ICD-10-CM | POA: Diagnosis not present

## 2018-09-22 DIAGNOSIS — Z79899 Other long term (current) drug therapy: Secondary | ICD-10-CM | POA: Diagnosis not present

## 2018-09-23 DIAGNOSIS — R17 Unspecified jaundice: Secondary | ICD-10-CM | POA: Diagnosis not present

## 2018-09-23 DIAGNOSIS — Z23 Encounter for immunization: Secondary | ICD-10-CM | POA: Diagnosis not present

## 2018-09-23 DIAGNOSIS — E440 Moderate protein-calorie malnutrition: Secondary | ICD-10-CM | POA: Diagnosis not present

## 2018-09-23 DIAGNOSIS — R82998 Other abnormal findings in urine: Secondary | ICD-10-CM | POA: Diagnosis not present

## 2018-09-23 DIAGNOSIS — D509 Iron deficiency anemia, unspecified: Secondary | ICD-10-CM | POA: Diagnosis not present

## 2018-09-23 DIAGNOSIS — N186 End stage renal disease: Secondary | ICD-10-CM | POA: Diagnosis not present

## 2018-09-23 DIAGNOSIS — D631 Anemia in chronic kidney disease: Secondary | ICD-10-CM | POA: Diagnosis not present

## 2018-09-23 DIAGNOSIS — N2581 Secondary hyperparathyroidism of renal origin: Secondary | ICD-10-CM | POA: Diagnosis not present

## 2018-09-23 DIAGNOSIS — Z79899 Other long term (current) drug therapy: Secondary | ICD-10-CM | POA: Diagnosis not present

## 2018-09-24 DIAGNOSIS — Z79899 Other long term (current) drug therapy: Secondary | ICD-10-CM | POA: Diagnosis not present

## 2018-09-24 DIAGNOSIS — R82998 Other abnormal findings in urine: Secondary | ICD-10-CM | POA: Diagnosis not present

## 2018-09-24 DIAGNOSIS — R17 Unspecified jaundice: Secondary | ICD-10-CM | POA: Diagnosis not present

## 2018-09-24 DIAGNOSIS — E440 Moderate protein-calorie malnutrition: Secondary | ICD-10-CM | POA: Diagnosis not present

## 2018-09-24 DIAGNOSIS — N186 End stage renal disease: Secondary | ICD-10-CM | POA: Diagnosis not present

## 2018-09-24 DIAGNOSIS — D631 Anemia in chronic kidney disease: Secondary | ICD-10-CM | POA: Diagnosis not present

## 2018-09-24 DIAGNOSIS — N2581 Secondary hyperparathyroidism of renal origin: Secondary | ICD-10-CM | POA: Diagnosis not present

## 2018-09-24 DIAGNOSIS — Z23 Encounter for immunization: Secondary | ICD-10-CM | POA: Diagnosis not present

## 2018-09-24 DIAGNOSIS — D509 Iron deficiency anemia, unspecified: Secondary | ICD-10-CM | POA: Diagnosis not present

## 2018-09-25 DIAGNOSIS — R17 Unspecified jaundice: Secondary | ICD-10-CM | POA: Diagnosis not present

## 2018-09-25 DIAGNOSIS — Z23 Encounter for immunization: Secondary | ICD-10-CM | POA: Diagnosis not present

## 2018-09-25 DIAGNOSIS — D509 Iron deficiency anemia, unspecified: Secondary | ICD-10-CM | POA: Diagnosis not present

## 2018-09-25 DIAGNOSIS — D631 Anemia in chronic kidney disease: Secondary | ICD-10-CM | POA: Diagnosis not present

## 2018-09-25 DIAGNOSIS — N2581 Secondary hyperparathyroidism of renal origin: Secondary | ICD-10-CM | POA: Diagnosis not present

## 2018-09-25 DIAGNOSIS — Z79899 Other long term (current) drug therapy: Secondary | ICD-10-CM | POA: Diagnosis not present

## 2018-09-25 DIAGNOSIS — N186 End stage renal disease: Secondary | ICD-10-CM | POA: Diagnosis not present

## 2018-09-25 DIAGNOSIS — E440 Moderate protein-calorie malnutrition: Secondary | ICD-10-CM | POA: Diagnosis not present

## 2018-09-25 DIAGNOSIS — R82998 Other abnormal findings in urine: Secondary | ICD-10-CM | POA: Diagnosis not present

## 2018-09-26 DIAGNOSIS — Z79899 Other long term (current) drug therapy: Secondary | ICD-10-CM | POA: Diagnosis not present

## 2018-09-26 DIAGNOSIS — R82998 Other abnormal findings in urine: Secondary | ICD-10-CM | POA: Diagnosis not present

## 2018-09-26 DIAGNOSIS — D509 Iron deficiency anemia, unspecified: Secondary | ICD-10-CM | POA: Diagnosis not present

## 2018-09-26 DIAGNOSIS — N186 End stage renal disease: Secondary | ICD-10-CM | POA: Diagnosis not present

## 2018-09-26 DIAGNOSIS — R17 Unspecified jaundice: Secondary | ICD-10-CM | POA: Diagnosis not present

## 2018-09-26 DIAGNOSIS — D631 Anemia in chronic kidney disease: Secondary | ICD-10-CM | POA: Diagnosis not present

## 2018-09-26 DIAGNOSIS — Z23 Encounter for immunization: Secondary | ICD-10-CM | POA: Diagnosis not present

## 2018-09-26 DIAGNOSIS — N2581 Secondary hyperparathyroidism of renal origin: Secondary | ICD-10-CM | POA: Diagnosis not present

## 2018-09-26 DIAGNOSIS — E440 Moderate protein-calorie malnutrition: Secondary | ICD-10-CM | POA: Diagnosis not present

## 2018-09-27 DIAGNOSIS — D631 Anemia in chronic kidney disease: Secondary | ICD-10-CM | POA: Diagnosis not present

## 2018-09-27 DIAGNOSIS — D509 Iron deficiency anemia, unspecified: Secondary | ICD-10-CM | POA: Diagnosis not present

## 2018-09-27 DIAGNOSIS — Z79899 Other long term (current) drug therapy: Secondary | ICD-10-CM | POA: Diagnosis not present

## 2018-09-27 DIAGNOSIS — E440 Moderate protein-calorie malnutrition: Secondary | ICD-10-CM | POA: Diagnosis not present

## 2018-09-27 DIAGNOSIS — N2581 Secondary hyperparathyroidism of renal origin: Secondary | ICD-10-CM | POA: Diagnosis not present

## 2018-09-27 DIAGNOSIS — R17 Unspecified jaundice: Secondary | ICD-10-CM | POA: Diagnosis not present

## 2018-09-27 DIAGNOSIS — Z23 Encounter for immunization: Secondary | ICD-10-CM | POA: Diagnosis not present

## 2018-09-27 DIAGNOSIS — N186 End stage renal disease: Secondary | ICD-10-CM | POA: Diagnosis not present

## 2018-09-27 DIAGNOSIS — R82998 Other abnormal findings in urine: Secondary | ICD-10-CM | POA: Diagnosis not present

## 2018-09-28 DIAGNOSIS — D509 Iron deficiency anemia, unspecified: Secondary | ICD-10-CM | POA: Diagnosis not present

## 2018-09-28 DIAGNOSIS — Z23 Encounter for immunization: Secondary | ICD-10-CM | POA: Diagnosis not present

## 2018-09-28 DIAGNOSIS — R82998 Other abnormal findings in urine: Secondary | ICD-10-CM | POA: Diagnosis not present

## 2018-09-28 DIAGNOSIS — D631 Anemia in chronic kidney disease: Secondary | ICD-10-CM | POA: Diagnosis not present

## 2018-09-28 DIAGNOSIS — R17 Unspecified jaundice: Secondary | ICD-10-CM | POA: Diagnosis not present

## 2018-09-28 DIAGNOSIS — Z79899 Other long term (current) drug therapy: Secondary | ICD-10-CM | POA: Diagnosis not present

## 2018-09-28 DIAGNOSIS — N2581 Secondary hyperparathyroidism of renal origin: Secondary | ICD-10-CM | POA: Diagnosis not present

## 2018-09-28 DIAGNOSIS — E440 Moderate protein-calorie malnutrition: Secondary | ICD-10-CM | POA: Diagnosis not present

## 2018-09-28 DIAGNOSIS — N186 End stage renal disease: Secondary | ICD-10-CM | POA: Diagnosis not present

## 2018-09-29 DIAGNOSIS — R82998 Other abnormal findings in urine: Secondary | ICD-10-CM | POA: Diagnosis not present

## 2018-09-29 DIAGNOSIS — D509 Iron deficiency anemia, unspecified: Secondary | ICD-10-CM | POA: Diagnosis not present

## 2018-09-29 DIAGNOSIS — R17 Unspecified jaundice: Secondary | ICD-10-CM | POA: Diagnosis not present

## 2018-09-29 DIAGNOSIS — D631 Anemia in chronic kidney disease: Secondary | ICD-10-CM | POA: Diagnosis not present

## 2018-09-29 DIAGNOSIS — N186 End stage renal disease: Secondary | ICD-10-CM | POA: Diagnosis not present

## 2018-09-29 DIAGNOSIS — N2581 Secondary hyperparathyroidism of renal origin: Secondary | ICD-10-CM | POA: Diagnosis not present

## 2018-09-29 DIAGNOSIS — E440 Moderate protein-calorie malnutrition: Secondary | ICD-10-CM | POA: Diagnosis not present

## 2018-09-29 DIAGNOSIS — Z79899 Other long term (current) drug therapy: Secondary | ICD-10-CM | POA: Diagnosis not present

## 2018-09-29 DIAGNOSIS — Z23 Encounter for immunization: Secondary | ICD-10-CM | POA: Diagnosis not present

## 2018-09-30 DIAGNOSIS — E440 Moderate protein-calorie malnutrition: Secondary | ICD-10-CM | POA: Diagnosis not present

## 2018-09-30 DIAGNOSIS — N186 End stage renal disease: Secondary | ICD-10-CM | POA: Diagnosis not present

## 2018-09-30 DIAGNOSIS — R17 Unspecified jaundice: Secondary | ICD-10-CM | POA: Diagnosis not present

## 2018-09-30 DIAGNOSIS — D509 Iron deficiency anemia, unspecified: Secondary | ICD-10-CM | POA: Diagnosis not present

## 2018-09-30 DIAGNOSIS — D631 Anemia in chronic kidney disease: Secondary | ICD-10-CM | POA: Diagnosis not present

## 2018-09-30 DIAGNOSIS — N2581 Secondary hyperparathyroidism of renal origin: Secondary | ICD-10-CM | POA: Diagnosis not present

## 2018-09-30 DIAGNOSIS — Z79899 Other long term (current) drug therapy: Secondary | ICD-10-CM | POA: Diagnosis not present

## 2018-09-30 DIAGNOSIS — R82998 Other abnormal findings in urine: Secondary | ICD-10-CM | POA: Diagnosis not present

## 2018-09-30 DIAGNOSIS — Z23 Encounter for immunization: Secondary | ICD-10-CM | POA: Diagnosis not present

## 2018-10-01 DIAGNOSIS — R17 Unspecified jaundice: Secondary | ICD-10-CM | POA: Diagnosis not present

## 2018-10-01 DIAGNOSIS — D509 Iron deficiency anemia, unspecified: Secondary | ICD-10-CM | POA: Diagnosis not present

## 2018-10-01 DIAGNOSIS — N2581 Secondary hyperparathyroidism of renal origin: Secondary | ICD-10-CM | POA: Diagnosis not present

## 2018-10-01 DIAGNOSIS — R82998 Other abnormal findings in urine: Secondary | ICD-10-CM | POA: Diagnosis not present

## 2018-10-01 DIAGNOSIS — Z23 Encounter for immunization: Secondary | ICD-10-CM | POA: Diagnosis not present

## 2018-10-01 DIAGNOSIS — N186 End stage renal disease: Secondary | ICD-10-CM | POA: Diagnosis not present

## 2018-10-01 DIAGNOSIS — Z79899 Other long term (current) drug therapy: Secondary | ICD-10-CM | POA: Diagnosis not present

## 2018-10-01 DIAGNOSIS — E440 Moderate protein-calorie malnutrition: Secondary | ICD-10-CM | POA: Diagnosis not present

## 2018-10-01 DIAGNOSIS — D631 Anemia in chronic kidney disease: Secondary | ICD-10-CM | POA: Diagnosis not present

## 2018-10-02 DIAGNOSIS — D509 Iron deficiency anemia, unspecified: Secondary | ICD-10-CM | POA: Diagnosis not present

## 2018-10-02 DIAGNOSIS — E440 Moderate protein-calorie malnutrition: Secondary | ICD-10-CM | POA: Diagnosis not present

## 2018-10-02 DIAGNOSIS — D631 Anemia in chronic kidney disease: Secondary | ICD-10-CM | POA: Diagnosis not present

## 2018-10-02 DIAGNOSIS — N186 End stage renal disease: Secondary | ICD-10-CM | POA: Diagnosis not present

## 2018-10-02 DIAGNOSIS — N2581 Secondary hyperparathyroidism of renal origin: Secondary | ICD-10-CM | POA: Diagnosis not present

## 2018-10-02 DIAGNOSIS — Z79899 Other long term (current) drug therapy: Secondary | ICD-10-CM | POA: Diagnosis not present

## 2018-10-02 DIAGNOSIS — Z23 Encounter for immunization: Secondary | ICD-10-CM | POA: Diagnosis not present

## 2018-10-02 DIAGNOSIS — R82998 Other abnormal findings in urine: Secondary | ICD-10-CM | POA: Diagnosis not present

## 2018-10-02 DIAGNOSIS — R17 Unspecified jaundice: Secondary | ICD-10-CM | POA: Diagnosis not present

## 2018-10-03 DIAGNOSIS — N2581 Secondary hyperparathyroidism of renal origin: Secondary | ICD-10-CM | POA: Diagnosis not present

## 2018-10-03 DIAGNOSIS — D631 Anemia in chronic kidney disease: Secondary | ICD-10-CM | POA: Diagnosis not present

## 2018-10-03 DIAGNOSIS — R17 Unspecified jaundice: Secondary | ICD-10-CM | POA: Diagnosis not present

## 2018-10-03 DIAGNOSIS — Z79899 Other long term (current) drug therapy: Secondary | ICD-10-CM | POA: Diagnosis not present

## 2018-10-03 DIAGNOSIS — N186 End stage renal disease: Secondary | ICD-10-CM | POA: Diagnosis not present

## 2018-10-03 DIAGNOSIS — E440 Moderate protein-calorie malnutrition: Secondary | ICD-10-CM | POA: Diagnosis not present

## 2018-10-03 DIAGNOSIS — R82998 Other abnormal findings in urine: Secondary | ICD-10-CM | POA: Diagnosis not present

## 2018-10-03 DIAGNOSIS — Z23 Encounter for immunization: Secondary | ICD-10-CM | POA: Diagnosis not present

## 2018-10-03 DIAGNOSIS — D509 Iron deficiency anemia, unspecified: Secondary | ICD-10-CM | POA: Diagnosis not present

## 2018-10-04 DIAGNOSIS — N2581 Secondary hyperparathyroidism of renal origin: Secondary | ICD-10-CM | POA: Diagnosis not present

## 2018-10-04 DIAGNOSIS — D509 Iron deficiency anemia, unspecified: Secondary | ICD-10-CM | POA: Diagnosis not present

## 2018-10-04 DIAGNOSIS — R82998 Other abnormal findings in urine: Secondary | ICD-10-CM | POA: Diagnosis not present

## 2018-10-04 DIAGNOSIS — N186 End stage renal disease: Secondary | ICD-10-CM | POA: Diagnosis not present

## 2018-10-04 DIAGNOSIS — E440 Moderate protein-calorie malnutrition: Secondary | ICD-10-CM | POA: Diagnosis not present

## 2018-10-04 DIAGNOSIS — R17 Unspecified jaundice: Secondary | ICD-10-CM | POA: Diagnosis not present

## 2018-10-04 DIAGNOSIS — Z23 Encounter for immunization: Secondary | ICD-10-CM | POA: Diagnosis not present

## 2018-10-04 DIAGNOSIS — Z79899 Other long term (current) drug therapy: Secondary | ICD-10-CM | POA: Diagnosis not present

## 2018-10-04 DIAGNOSIS — D631 Anemia in chronic kidney disease: Secondary | ICD-10-CM | POA: Diagnosis not present

## 2018-10-05 DIAGNOSIS — D631 Anemia in chronic kidney disease: Secondary | ICD-10-CM | POA: Diagnosis not present

## 2018-10-05 DIAGNOSIS — E440 Moderate protein-calorie malnutrition: Secondary | ICD-10-CM | POA: Diagnosis not present

## 2018-10-05 DIAGNOSIS — N186 End stage renal disease: Secondary | ICD-10-CM | POA: Diagnosis not present

## 2018-10-05 DIAGNOSIS — R82998 Other abnormal findings in urine: Secondary | ICD-10-CM | POA: Diagnosis not present

## 2018-10-05 DIAGNOSIS — R17 Unspecified jaundice: Secondary | ICD-10-CM | POA: Diagnosis not present

## 2018-10-05 DIAGNOSIS — D509 Iron deficiency anemia, unspecified: Secondary | ICD-10-CM | POA: Diagnosis not present

## 2018-10-05 DIAGNOSIS — Z79899 Other long term (current) drug therapy: Secondary | ICD-10-CM | POA: Diagnosis not present

## 2018-10-05 DIAGNOSIS — Z23 Encounter for immunization: Secondary | ICD-10-CM | POA: Diagnosis not present

## 2018-10-05 DIAGNOSIS — N2581 Secondary hyperparathyroidism of renal origin: Secondary | ICD-10-CM | POA: Diagnosis not present

## 2018-10-06 DIAGNOSIS — R82998 Other abnormal findings in urine: Secondary | ICD-10-CM | POA: Diagnosis not present

## 2018-10-06 DIAGNOSIS — Z79899 Other long term (current) drug therapy: Secondary | ICD-10-CM | POA: Diagnosis not present

## 2018-10-06 DIAGNOSIS — D509 Iron deficiency anemia, unspecified: Secondary | ICD-10-CM | POA: Diagnosis not present

## 2018-10-06 DIAGNOSIS — D631 Anemia in chronic kidney disease: Secondary | ICD-10-CM | POA: Diagnosis not present

## 2018-10-06 DIAGNOSIS — R17 Unspecified jaundice: Secondary | ICD-10-CM | POA: Diagnosis not present

## 2018-10-06 DIAGNOSIS — N2581 Secondary hyperparathyroidism of renal origin: Secondary | ICD-10-CM | POA: Diagnosis not present

## 2018-10-06 DIAGNOSIS — Z23 Encounter for immunization: Secondary | ICD-10-CM | POA: Diagnosis not present

## 2018-10-06 DIAGNOSIS — E440 Moderate protein-calorie malnutrition: Secondary | ICD-10-CM | POA: Diagnosis not present

## 2018-10-06 DIAGNOSIS — N186 End stage renal disease: Secondary | ICD-10-CM | POA: Diagnosis not present

## 2018-10-07 DIAGNOSIS — R17 Unspecified jaundice: Secondary | ICD-10-CM | POA: Diagnosis not present

## 2018-10-07 DIAGNOSIS — N186 End stage renal disease: Secondary | ICD-10-CM | POA: Diagnosis not present

## 2018-10-07 DIAGNOSIS — E440 Moderate protein-calorie malnutrition: Secondary | ICD-10-CM | POA: Diagnosis not present

## 2018-10-07 DIAGNOSIS — Z79899 Other long term (current) drug therapy: Secondary | ICD-10-CM | POA: Diagnosis not present

## 2018-10-07 DIAGNOSIS — R82998 Other abnormal findings in urine: Secondary | ICD-10-CM | POA: Diagnosis not present

## 2018-10-07 DIAGNOSIS — D509 Iron deficiency anemia, unspecified: Secondary | ICD-10-CM | POA: Diagnosis not present

## 2018-10-07 DIAGNOSIS — D631 Anemia in chronic kidney disease: Secondary | ICD-10-CM | POA: Diagnosis not present

## 2018-10-07 DIAGNOSIS — Z23 Encounter for immunization: Secondary | ICD-10-CM | POA: Diagnosis not present

## 2018-10-07 DIAGNOSIS — N2581 Secondary hyperparathyroidism of renal origin: Secondary | ICD-10-CM | POA: Diagnosis not present

## 2018-10-08 DIAGNOSIS — D631 Anemia in chronic kidney disease: Secondary | ICD-10-CM | POA: Diagnosis not present

## 2018-10-08 DIAGNOSIS — D509 Iron deficiency anemia, unspecified: Secondary | ICD-10-CM | POA: Diagnosis not present

## 2018-10-08 DIAGNOSIS — Z23 Encounter for immunization: Secondary | ICD-10-CM | POA: Diagnosis not present

## 2018-10-08 DIAGNOSIS — R17 Unspecified jaundice: Secondary | ICD-10-CM | POA: Diagnosis not present

## 2018-10-08 DIAGNOSIS — N2581 Secondary hyperparathyroidism of renal origin: Secondary | ICD-10-CM | POA: Diagnosis not present

## 2018-10-08 DIAGNOSIS — R82998 Other abnormal findings in urine: Secondary | ICD-10-CM | POA: Diagnosis not present

## 2018-10-08 DIAGNOSIS — Z79899 Other long term (current) drug therapy: Secondary | ICD-10-CM | POA: Diagnosis not present

## 2018-10-08 DIAGNOSIS — E440 Moderate protein-calorie malnutrition: Secondary | ICD-10-CM | POA: Diagnosis not present

## 2018-10-08 DIAGNOSIS — N186 End stage renal disease: Secondary | ICD-10-CM | POA: Diagnosis not present

## 2018-10-09 DIAGNOSIS — R82998 Other abnormal findings in urine: Secondary | ICD-10-CM | POA: Diagnosis not present

## 2018-10-09 DIAGNOSIS — N186 End stage renal disease: Secondary | ICD-10-CM | POA: Diagnosis not present

## 2018-10-09 DIAGNOSIS — D631 Anemia in chronic kidney disease: Secondary | ICD-10-CM | POA: Diagnosis not present

## 2018-10-09 DIAGNOSIS — N2581 Secondary hyperparathyroidism of renal origin: Secondary | ICD-10-CM | POA: Diagnosis not present

## 2018-10-09 DIAGNOSIS — E440 Moderate protein-calorie malnutrition: Secondary | ICD-10-CM | POA: Diagnosis not present

## 2018-10-09 DIAGNOSIS — Z79899 Other long term (current) drug therapy: Secondary | ICD-10-CM | POA: Diagnosis not present

## 2018-10-09 DIAGNOSIS — Z23 Encounter for immunization: Secondary | ICD-10-CM | POA: Diagnosis not present

## 2018-10-09 DIAGNOSIS — D509 Iron deficiency anemia, unspecified: Secondary | ICD-10-CM | POA: Diagnosis not present

## 2018-10-09 DIAGNOSIS — R17 Unspecified jaundice: Secondary | ICD-10-CM | POA: Diagnosis not present

## 2018-10-10 DIAGNOSIS — E440 Moderate protein-calorie malnutrition: Secondary | ICD-10-CM | POA: Diagnosis not present

## 2018-10-10 DIAGNOSIS — D509 Iron deficiency anemia, unspecified: Secondary | ICD-10-CM | POA: Diagnosis not present

## 2018-10-10 DIAGNOSIS — R82998 Other abnormal findings in urine: Secondary | ICD-10-CM | POA: Diagnosis not present

## 2018-10-10 DIAGNOSIS — N2581 Secondary hyperparathyroidism of renal origin: Secondary | ICD-10-CM | POA: Diagnosis not present

## 2018-10-10 DIAGNOSIS — R17 Unspecified jaundice: Secondary | ICD-10-CM | POA: Diagnosis not present

## 2018-10-10 DIAGNOSIS — Z79899 Other long term (current) drug therapy: Secondary | ICD-10-CM | POA: Diagnosis not present

## 2018-10-10 DIAGNOSIS — Z23 Encounter for immunization: Secondary | ICD-10-CM | POA: Diagnosis not present

## 2018-10-10 DIAGNOSIS — D631 Anemia in chronic kidney disease: Secondary | ICD-10-CM | POA: Diagnosis not present

## 2018-10-10 DIAGNOSIS — N186 End stage renal disease: Secondary | ICD-10-CM | POA: Diagnosis not present

## 2018-10-11 DIAGNOSIS — D509 Iron deficiency anemia, unspecified: Secondary | ICD-10-CM | POA: Diagnosis not present

## 2018-10-11 DIAGNOSIS — D631 Anemia in chronic kidney disease: Secondary | ICD-10-CM | POA: Diagnosis not present

## 2018-10-11 DIAGNOSIS — R82998 Other abnormal findings in urine: Secondary | ICD-10-CM | POA: Diagnosis not present

## 2018-10-11 DIAGNOSIS — Z79899 Other long term (current) drug therapy: Secondary | ICD-10-CM | POA: Diagnosis not present

## 2018-10-11 DIAGNOSIS — E440 Moderate protein-calorie malnutrition: Secondary | ICD-10-CM | POA: Diagnosis not present

## 2018-10-11 DIAGNOSIS — Z23 Encounter for immunization: Secondary | ICD-10-CM | POA: Diagnosis not present

## 2018-10-11 DIAGNOSIS — R17 Unspecified jaundice: Secondary | ICD-10-CM | POA: Diagnosis not present

## 2018-10-11 DIAGNOSIS — N186 End stage renal disease: Secondary | ICD-10-CM | POA: Diagnosis not present

## 2018-10-11 DIAGNOSIS — N2581 Secondary hyperparathyroidism of renal origin: Secondary | ICD-10-CM | POA: Diagnosis not present

## 2018-10-12 DIAGNOSIS — N186 End stage renal disease: Secondary | ICD-10-CM | POA: Diagnosis not present

## 2018-10-12 DIAGNOSIS — D631 Anemia in chronic kidney disease: Secondary | ICD-10-CM | POA: Diagnosis not present

## 2018-10-12 DIAGNOSIS — Z79899 Other long term (current) drug therapy: Secondary | ICD-10-CM | POA: Diagnosis not present

## 2018-10-12 DIAGNOSIS — N2581 Secondary hyperparathyroidism of renal origin: Secondary | ICD-10-CM | POA: Diagnosis not present

## 2018-10-12 DIAGNOSIS — R17 Unspecified jaundice: Secondary | ICD-10-CM | POA: Diagnosis not present

## 2018-10-12 DIAGNOSIS — R82998 Other abnormal findings in urine: Secondary | ICD-10-CM | POA: Diagnosis not present

## 2018-10-12 DIAGNOSIS — Z23 Encounter for immunization: Secondary | ICD-10-CM | POA: Diagnosis not present

## 2018-10-12 DIAGNOSIS — E440 Moderate protein-calorie malnutrition: Secondary | ICD-10-CM | POA: Diagnosis not present

## 2018-10-12 DIAGNOSIS — D509 Iron deficiency anemia, unspecified: Secondary | ICD-10-CM | POA: Diagnosis not present

## 2018-10-13 DIAGNOSIS — Z79899 Other long term (current) drug therapy: Secondary | ICD-10-CM | POA: Diagnosis not present

## 2018-10-13 DIAGNOSIS — R17 Unspecified jaundice: Secondary | ICD-10-CM | POA: Diagnosis not present

## 2018-10-13 DIAGNOSIS — N186 End stage renal disease: Secondary | ICD-10-CM | POA: Diagnosis not present

## 2018-10-13 DIAGNOSIS — D631 Anemia in chronic kidney disease: Secondary | ICD-10-CM | POA: Diagnosis not present

## 2018-10-13 DIAGNOSIS — R82998 Other abnormal findings in urine: Secondary | ICD-10-CM | POA: Diagnosis not present

## 2018-10-13 DIAGNOSIS — E440 Moderate protein-calorie malnutrition: Secondary | ICD-10-CM | POA: Diagnosis not present

## 2018-10-13 DIAGNOSIS — D509 Iron deficiency anemia, unspecified: Secondary | ICD-10-CM | POA: Diagnosis not present

## 2018-10-13 DIAGNOSIS — Z23 Encounter for immunization: Secondary | ICD-10-CM | POA: Diagnosis not present

## 2018-10-13 DIAGNOSIS — N2581 Secondary hyperparathyroidism of renal origin: Secondary | ICD-10-CM | POA: Diagnosis not present

## 2018-10-14 DIAGNOSIS — Z23 Encounter for immunization: Secondary | ICD-10-CM | POA: Diagnosis not present

## 2018-10-14 DIAGNOSIS — D631 Anemia in chronic kidney disease: Secondary | ICD-10-CM | POA: Diagnosis not present

## 2018-10-14 DIAGNOSIS — Z79899 Other long term (current) drug therapy: Secondary | ICD-10-CM | POA: Diagnosis not present

## 2018-10-14 DIAGNOSIS — E440 Moderate protein-calorie malnutrition: Secondary | ICD-10-CM | POA: Diagnosis not present

## 2018-10-14 DIAGNOSIS — D509 Iron deficiency anemia, unspecified: Secondary | ICD-10-CM | POA: Diagnosis not present

## 2018-10-14 DIAGNOSIS — R82998 Other abnormal findings in urine: Secondary | ICD-10-CM | POA: Diagnosis not present

## 2018-10-14 DIAGNOSIS — R17 Unspecified jaundice: Secondary | ICD-10-CM | POA: Diagnosis not present

## 2018-10-14 DIAGNOSIS — N2581 Secondary hyperparathyroidism of renal origin: Secondary | ICD-10-CM | POA: Diagnosis not present

## 2018-10-14 DIAGNOSIS — N186 End stage renal disease: Secondary | ICD-10-CM | POA: Diagnosis not present

## 2018-10-15 DIAGNOSIS — E440 Moderate protein-calorie malnutrition: Secondary | ICD-10-CM | POA: Diagnosis not present

## 2018-10-15 DIAGNOSIS — Z23 Encounter for immunization: Secondary | ICD-10-CM | POA: Diagnosis not present

## 2018-10-15 DIAGNOSIS — R82998 Other abnormal findings in urine: Secondary | ICD-10-CM | POA: Diagnosis not present

## 2018-10-15 DIAGNOSIS — D509 Iron deficiency anemia, unspecified: Secondary | ICD-10-CM | POA: Diagnosis not present

## 2018-10-15 DIAGNOSIS — D631 Anemia in chronic kidney disease: Secondary | ICD-10-CM | POA: Diagnosis not present

## 2018-10-15 DIAGNOSIS — Z79899 Other long term (current) drug therapy: Secondary | ICD-10-CM | POA: Diagnosis not present

## 2018-10-15 DIAGNOSIS — N2581 Secondary hyperparathyroidism of renal origin: Secondary | ICD-10-CM | POA: Diagnosis not present

## 2018-10-15 DIAGNOSIS — N186 End stage renal disease: Secondary | ICD-10-CM | POA: Diagnosis not present

## 2018-10-15 DIAGNOSIS — R17 Unspecified jaundice: Secondary | ICD-10-CM | POA: Diagnosis not present

## 2018-10-16 DIAGNOSIS — R17 Unspecified jaundice: Secondary | ICD-10-CM | POA: Diagnosis not present

## 2018-10-16 DIAGNOSIS — R82998 Other abnormal findings in urine: Secondary | ICD-10-CM | POA: Diagnosis not present

## 2018-10-16 DIAGNOSIS — E440 Moderate protein-calorie malnutrition: Secondary | ICD-10-CM | POA: Diagnosis not present

## 2018-10-16 DIAGNOSIS — N186 End stage renal disease: Secondary | ICD-10-CM | POA: Diagnosis not present

## 2018-10-16 DIAGNOSIS — D509 Iron deficiency anemia, unspecified: Secondary | ICD-10-CM | POA: Diagnosis not present

## 2018-10-16 DIAGNOSIS — N2581 Secondary hyperparathyroidism of renal origin: Secondary | ICD-10-CM | POA: Diagnosis not present

## 2018-10-16 DIAGNOSIS — D631 Anemia in chronic kidney disease: Secondary | ICD-10-CM | POA: Diagnosis not present

## 2018-10-16 DIAGNOSIS — Z23 Encounter for immunization: Secondary | ICD-10-CM | POA: Diagnosis not present

## 2018-10-16 DIAGNOSIS — Z79899 Other long term (current) drug therapy: Secondary | ICD-10-CM | POA: Diagnosis not present

## 2018-10-17 DIAGNOSIS — N2581 Secondary hyperparathyroidism of renal origin: Secondary | ICD-10-CM | POA: Diagnosis not present

## 2018-10-17 DIAGNOSIS — E440 Moderate protein-calorie malnutrition: Secondary | ICD-10-CM | POA: Diagnosis not present

## 2018-10-17 DIAGNOSIS — D631 Anemia in chronic kidney disease: Secondary | ICD-10-CM | POA: Diagnosis not present

## 2018-10-17 DIAGNOSIS — Z23 Encounter for immunization: Secondary | ICD-10-CM | POA: Diagnosis not present

## 2018-10-17 DIAGNOSIS — R17 Unspecified jaundice: Secondary | ICD-10-CM | POA: Diagnosis not present

## 2018-10-17 DIAGNOSIS — N186 End stage renal disease: Secondary | ICD-10-CM | POA: Diagnosis not present

## 2018-10-17 DIAGNOSIS — D509 Iron deficiency anemia, unspecified: Secondary | ICD-10-CM | POA: Diagnosis not present

## 2018-10-17 DIAGNOSIS — R82998 Other abnormal findings in urine: Secondary | ICD-10-CM | POA: Diagnosis not present

## 2018-10-17 DIAGNOSIS — Z79899 Other long term (current) drug therapy: Secondary | ICD-10-CM | POA: Diagnosis not present

## 2018-10-18 DIAGNOSIS — N2581 Secondary hyperparathyroidism of renal origin: Secondary | ICD-10-CM | POA: Diagnosis not present

## 2018-10-18 DIAGNOSIS — R17 Unspecified jaundice: Secondary | ICD-10-CM | POA: Diagnosis not present

## 2018-10-18 DIAGNOSIS — N186 End stage renal disease: Secondary | ICD-10-CM | POA: Diagnosis not present

## 2018-10-18 DIAGNOSIS — D509 Iron deficiency anemia, unspecified: Secondary | ICD-10-CM | POA: Diagnosis not present

## 2018-10-18 DIAGNOSIS — Z79899 Other long term (current) drug therapy: Secondary | ICD-10-CM | POA: Diagnosis not present

## 2018-10-18 DIAGNOSIS — D631 Anemia in chronic kidney disease: Secondary | ICD-10-CM | POA: Diagnosis not present

## 2018-10-18 DIAGNOSIS — Z23 Encounter for immunization: Secondary | ICD-10-CM | POA: Diagnosis not present

## 2018-10-18 DIAGNOSIS — R82998 Other abnormal findings in urine: Secondary | ICD-10-CM | POA: Diagnosis not present

## 2018-10-18 DIAGNOSIS — E440 Moderate protein-calorie malnutrition: Secondary | ICD-10-CM | POA: Diagnosis not present

## 2018-10-19 DIAGNOSIS — N2581 Secondary hyperparathyroidism of renal origin: Secondary | ICD-10-CM | POA: Diagnosis not present

## 2018-10-19 DIAGNOSIS — R82998 Other abnormal findings in urine: Secondary | ICD-10-CM | POA: Diagnosis not present

## 2018-10-19 DIAGNOSIS — E440 Moderate protein-calorie malnutrition: Secondary | ICD-10-CM | POA: Diagnosis not present

## 2018-10-19 DIAGNOSIS — Z992 Dependence on renal dialysis: Secondary | ICD-10-CM | POA: Diagnosis not present

## 2018-10-19 DIAGNOSIS — Z23 Encounter for immunization: Secondary | ICD-10-CM | POA: Diagnosis not present

## 2018-10-19 DIAGNOSIS — E1122 Type 2 diabetes mellitus with diabetic chronic kidney disease: Secondary | ICD-10-CM | POA: Diagnosis not present

## 2018-10-19 DIAGNOSIS — D631 Anemia in chronic kidney disease: Secondary | ICD-10-CM | POA: Diagnosis not present

## 2018-10-19 DIAGNOSIS — N186 End stage renal disease: Secondary | ICD-10-CM | POA: Diagnosis not present

## 2018-10-19 DIAGNOSIS — R17 Unspecified jaundice: Secondary | ICD-10-CM | POA: Diagnosis not present

## 2018-10-19 DIAGNOSIS — D509 Iron deficiency anemia, unspecified: Secondary | ICD-10-CM | POA: Diagnosis not present

## 2018-10-19 DIAGNOSIS — Z79899 Other long term (current) drug therapy: Secondary | ICD-10-CM | POA: Diagnosis not present

## 2018-10-20 DIAGNOSIS — N2581 Secondary hyperparathyroidism of renal origin: Secondary | ICD-10-CM | POA: Diagnosis not present

## 2018-10-20 DIAGNOSIS — D631 Anemia in chronic kidney disease: Secondary | ICD-10-CM | POA: Diagnosis not present

## 2018-10-20 DIAGNOSIS — R82998 Other abnormal findings in urine: Secondary | ICD-10-CM | POA: Diagnosis not present

## 2018-10-20 DIAGNOSIS — N186 End stage renal disease: Secondary | ICD-10-CM | POA: Diagnosis not present

## 2018-10-20 DIAGNOSIS — E440 Moderate protein-calorie malnutrition: Secondary | ICD-10-CM | POA: Diagnosis not present

## 2018-10-20 DIAGNOSIS — Z79899 Other long term (current) drug therapy: Secondary | ICD-10-CM | POA: Diagnosis not present

## 2018-10-20 DIAGNOSIS — R17 Unspecified jaundice: Secondary | ICD-10-CM | POA: Diagnosis not present

## 2018-10-20 DIAGNOSIS — D509 Iron deficiency anemia, unspecified: Secondary | ICD-10-CM | POA: Diagnosis not present

## 2018-10-21 DIAGNOSIS — R82998 Other abnormal findings in urine: Secondary | ICD-10-CM | POA: Diagnosis not present

## 2018-10-21 DIAGNOSIS — N186 End stage renal disease: Secondary | ICD-10-CM | POA: Diagnosis not present

## 2018-10-21 DIAGNOSIS — E440 Moderate protein-calorie malnutrition: Secondary | ICD-10-CM | POA: Diagnosis not present

## 2018-10-21 DIAGNOSIS — R17 Unspecified jaundice: Secondary | ICD-10-CM | POA: Diagnosis not present

## 2018-10-21 DIAGNOSIS — D509 Iron deficiency anemia, unspecified: Secondary | ICD-10-CM | POA: Diagnosis not present

## 2018-10-21 DIAGNOSIS — D631 Anemia in chronic kidney disease: Secondary | ICD-10-CM | POA: Diagnosis not present

## 2018-10-21 DIAGNOSIS — Z79899 Other long term (current) drug therapy: Secondary | ICD-10-CM | POA: Diagnosis not present

## 2018-10-21 DIAGNOSIS — N2581 Secondary hyperparathyroidism of renal origin: Secondary | ICD-10-CM | POA: Diagnosis not present

## 2018-10-22 DIAGNOSIS — R17 Unspecified jaundice: Secondary | ICD-10-CM | POA: Diagnosis not present

## 2018-10-22 DIAGNOSIS — D631 Anemia in chronic kidney disease: Secondary | ICD-10-CM | POA: Diagnosis not present

## 2018-10-22 DIAGNOSIS — E440 Moderate protein-calorie malnutrition: Secondary | ICD-10-CM | POA: Diagnosis not present

## 2018-10-22 DIAGNOSIS — N2581 Secondary hyperparathyroidism of renal origin: Secondary | ICD-10-CM | POA: Diagnosis not present

## 2018-10-22 DIAGNOSIS — R82998 Other abnormal findings in urine: Secondary | ICD-10-CM | POA: Diagnosis not present

## 2018-10-22 DIAGNOSIS — Z79899 Other long term (current) drug therapy: Secondary | ICD-10-CM | POA: Diagnosis not present

## 2018-10-22 DIAGNOSIS — D509 Iron deficiency anemia, unspecified: Secondary | ICD-10-CM | POA: Diagnosis not present

## 2018-10-22 DIAGNOSIS — N186 End stage renal disease: Secondary | ICD-10-CM | POA: Diagnosis not present

## 2018-10-23 DIAGNOSIS — R17 Unspecified jaundice: Secondary | ICD-10-CM | POA: Diagnosis not present

## 2018-10-23 DIAGNOSIS — N186 End stage renal disease: Secondary | ICD-10-CM | POA: Diagnosis not present

## 2018-10-23 DIAGNOSIS — Z79899 Other long term (current) drug therapy: Secondary | ICD-10-CM | POA: Diagnosis not present

## 2018-10-23 DIAGNOSIS — D509 Iron deficiency anemia, unspecified: Secondary | ICD-10-CM | POA: Diagnosis not present

## 2018-10-23 DIAGNOSIS — N2581 Secondary hyperparathyroidism of renal origin: Secondary | ICD-10-CM | POA: Diagnosis not present

## 2018-10-23 DIAGNOSIS — E440 Moderate protein-calorie malnutrition: Secondary | ICD-10-CM | POA: Diagnosis not present

## 2018-10-23 DIAGNOSIS — R82998 Other abnormal findings in urine: Secondary | ICD-10-CM | POA: Diagnosis not present

## 2018-10-23 DIAGNOSIS — D631 Anemia in chronic kidney disease: Secondary | ICD-10-CM | POA: Diagnosis not present

## 2018-10-24 DIAGNOSIS — E440 Moderate protein-calorie malnutrition: Secondary | ICD-10-CM | POA: Diagnosis not present

## 2018-10-24 DIAGNOSIS — R17 Unspecified jaundice: Secondary | ICD-10-CM | POA: Diagnosis not present

## 2018-10-24 DIAGNOSIS — N186 End stage renal disease: Secondary | ICD-10-CM | POA: Diagnosis not present

## 2018-10-24 DIAGNOSIS — N2581 Secondary hyperparathyroidism of renal origin: Secondary | ICD-10-CM | POA: Diagnosis not present

## 2018-10-24 DIAGNOSIS — D631 Anemia in chronic kidney disease: Secondary | ICD-10-CM | POA: Diagnosis not present

## 2018-10-24 DIAGNOSIS — D509 Iron deficiency anemia, unspecified: Secondary | ICD-10-CM | POA: Diagnosis not present

## 2018-10-24 DIAGNOSIS — R82998 Other abnormal findings in urine: Secondary | ICD-10-CM | POA: Diagnosis not present

## 2018-10-24 DIAGNOSIS — Z79899 Other long term (current) drug therapy: Secondary | ICD-10-CM | POA: Diagnosis not present

## 2018-10-25 DIAGNOSIS — R17 Unspecified jaundice: Secondary | ICD-10-CM | POA: Diagnosis not present

## 2018-10-25 DIAGNOSIS — N186 End stage renal disease: Secondary | ICD-10-CM | POA: Diagnosis not present

## 2018-10-25 DIAGNOSIS — D631 Anemia in chronic kidney disease: Secondary | ICD-10-CM | POA: Diagnosis not present

## 2018-10-25 DIAGNOSIS — E440 Moderate protein-calorie malnutrition: Secondary | ICD-10-CM | POA: Diagnosis not present

## 2018-10-25 DIAGNOSIS — R82998 Other abnormal findings in urine: Secondary | ICD-10-CM | POA: Diagnosis not present

## 2018-10-25 DIAGNOSIS — N2581 Secondary hyperparathyroidism of renal origin: Secondary | ICD-10-CM | POA: Diagnosis not present

## 2018-10-25 DIAGNOSIS — Z79899 Other long term (current) drug therapy: Secondary | ICD-10-CM | POA: Diagnosis not present

## 2018-10-25 DIAGNOSIS — D509 Iron deficiency anemia, unspecified: Secondary | ICD-10-CM | POA: Diagnosis not present

## 2018-10-26 DIAGNOSIS — D631 Anemia in chronic kidney disease: Secondary | ICD-10-CM | POA: Diagnosis not present

## 2018-10-26 DIAGNOSIS — N186 End stage renal disease: Secondary | ICD-10-CM | POA: Diagnosis not present

## 2018-10-26 DIAGNOSIS — D509 Iron deficiency anemia, unspecified: Secondary | ICD-10-CM | POA: Diagnosis not present

## 2018-10-26 DIAGNOSIS — R17 Unspecified jaundice: Secondary | ICD-10-CM | POA: Diagnosis not present

## 2018-10-26 DIAGNOSIS — N2581 Secondary hyperparathyroidism of renal origin: Secondary | ICD-10-CM | POA: Diagnosis not present

## 2018-10-26 DIAGNOSIS — R82998 Other abnormal findings in urine: Secondary | ICD-10-CM | POA: Diagnosis not present

## 2018-10-26 DIAGNOSIS — E440 Moderate protein-calorie malnutrition: Secondary | ICD-10-CM | POA: Diagnosis not present

## 2018-10-26 DIAGNOSIS — Z79899 Other long term (current) drug therapy: Secondary | ICD-10-CM | POA: Diagnosis not present

## 2018-10-27 DIAGNOSIS — N2581 Secondary hyperparathyroidism of renal origin: Secondary | ICD-10-CM | POA: Diagnosis not present

## 2018-10-27 DIAGNOSIS — N186 End stage renal disease: Secondary | ICD-10-CM | POA: Diagnosis not present

## 2018-10-27 DIAGNOSIS — D509 Iron deficiency anemia, unspecified: Secondary | ICD-10-CM | POA: Diagnosis not present

## 2018-10-27 DIAGNOSIS — R17 Unspecified jaundice: Secondary | ICD-10-CM | POA: Diagnosis not present

## 2018-10-27 DIAGNOSIS — Z79899 Other long term (current) drug therapy: Secondary | ICD-10-CM | POA: Diagnosis not present

## 2018-10-27 DIAGNOSIS — D631 Anemia in chronic kidney disease: Secondary | ICD-10-CM | POA: Diagnosis not present

## 2018-10-27 DIAGNOSIS — R82998 Other abnormal findings in urine: Secondary | ICD-10-CM | POA: Diagnosis not present

## 2018-10-27 DIAGNOSIS — E440 Moderate protein-calorie malnutrition: Secondary | ICD-10-CM | POA: Diagnosis not present

## 2018-10-28 DIAGNOSIS — R17 Unspecified jaundice: Secondary | ICD-10-CM | POA: Diagnosis not present

## 2018-10-28 DIAGNOSIS — Z79899 Other long term (current) drug therapy: Secondary | ICD-10-CM | POA: Diagnosis not present

## 2018-10-28 DIAGNOSIS — N186 End stage renal disease: Secondary | ICD-10-CM | POA: Diagnosis not present

## 2018-10-28 DIAGNOSIS — D509 Iron deficiency anemia, unspecified: Secondary | ICD-10-CM | POA: Diagnosis not present

## 2018-10-28 DIAGNOSIS — E440 Moderate protein-calorie malnutrition: Secondary | ICD-10-CM | POA: Diagnosis not present

## 2018-10-28 DIAGNOSIS — N2581 Secondary hyperparathyroidism of renal origin: Secondary | ICD-10-CM | POA: Diagnosis not present

## 2018-10-28 DIAGNOSIS — R82998 Other abnormal findings in urine: Secondary | ICD-10-CM | POA: Diagnosis not present

## 2018-10-28 DIAGNOSIS — D631 Anemia in chronic kidney disease: Secondary | ICD-10-CM | POA: Diagnosis not present

## 2018-10-29 DIAGNOSIS — N2581 Secondary hyperparathyroidism of renal origin: Secondary | ICD-10-CM | POA: Diagnosis not present

## 2018-10-29 DIAGNOSIS — E440 Moderate protein-calorie malnutrition: Secondary | ICD-10-CM | POA: Diagnosis not present

## 2018-10-29 DIAGNOSIS — R17 Unspecified jaundice: Secondary | ICD-10-CM | POA: Diagnosis not present

## 2018-10-29 DIAGNOSIS — Z79899 Other long term (current) drug therapy: Secondary | ICD-10-CM | POA: Diagnosis not present

## 2018-10-29 DIAGNOSIS — D509 Iron deficiency anemia, unspecified: Secondary | ICD-10-CM | POA: Diagnosis not present

## 2018-10-29 DIAGNOSIS — R82998 Other abnormal findings in urine: Secondary | ICD-10-CM | POA: Diagnosis not present

## 2018-10-29 DIAGNOSIS — D631 Anemia in chronic kidney disease: Secondary | ICD-10-CM | POA: Diagnosis not present

## 2018-10-29 DIAGNOSIS — N186 End stage renal disease: Secondary | ICD-10-CM | POA: Diagnosis not present

## 2018-10-30 ENCOUNTER — Other Ambulatory Visit: Payer: Self-pay | Admitting: Adult Health

## 2018-10-30 DIAGNOSIS — Z79899 Other long term (current) drug therapy: Secondary | ICD-10-CM | POA: Diagnosis not present

## 2018-10-30 DIAGNOSIS — R82998 Other abnormal findings in urine: Secondary | ICD-10-CM | POA: Diagnosis not present

## 2018-10-30 DIAGNOSIS — D509 Iron deficiency anemia, unspecified: Secondary | ICD-10-CM | POA: Diagnosis not present

## 2018-10-30 DIAGNOSIS — E440 Moderate protein-calorie malnutrition: Secondary | ICD-10-CM | POA: Diagnosis not present

## 2018-10-30 DIAGNOSIS — N2581 Secondary hyperparathyroidism of renal origin: Secondary | ICD-10-CM | POA: Diagnosis not present

## 2018-10-30 DIAGNOSIS — R17 Unspecified jaundice: Secondary | ICD-10-CM | POA: Diagnosis not present

## 2018-10-30 DIAGNOSIS — N186 End stage renal disease: Secondary | ICD-10-CM | POA: Diagnosis not present

## 2018-10-30 DIAGNOSIS — D631 Anemia in chronic kidney disease: Secondary | ICD-10-CM | POA: Diagnosis not present

## 2018-10-31 DIAGNOSIS — Z79899 Other long term (current) drug therapy: Secondary | ICD-10-CM | POA: Diagnosis not present

## 2018-10-31 DIAGNOSIS — N2581 Secondary hyperparathyroidism of renal origin: Secondary | ICD-10-CM | POA: Diagnosis not present

## 2018-10-31 DIAGNOSIS — R17 Unspecified jaundice: Secondary | ICD-10-CM | POA: Diagnosis not present

## 2018-10-31 DIAGNOSIS — D631 Anemia in chronic kidney disease: Secondary | ICD-10-CM | POA: Diagnosis not present

## 2018-10-31 DIAGNOSIS — N186 End stage renal disease: Secondary | ICD-10-CM | POA: Diagnosis not present

## 2018-10-31 DIAGNOSIS — R82998 Other abnormal findings in urine: Secondary | ICD-10-CM | POA: Diagnosis not present

## 2018-10-31 DIAGNOSIS — E440 Moderate protein-calorie malnutrition: Secondary | ICD-10-CM | POA: Diagnosis not present

## 2018-10-31 DIAGNOSIS — D509 Iron deficiency anemia, unspecified: Secondary | ICD-10-CM | POA: Diagnosis not present

## 2018-11-01 DIAGNOSIS — N186 End stage renal disease: Secondary | ICD-10-CM | POA: Diagnosis not present

## 2018-11-01 DIAGNOSIS — N2581 Secondary hyperparathyroidism of renal origin: Secondary | ICD-10-CM | POA: Diagnosis not present

## 2018-11-01 DIAGNOSIS — D509 Iron deficiency anemia, unspecified: Secondary | ICD-10-CM | POA: Diagnosis not present

## 2018-11-01 DIAGNOSIS — R17 Unspecified jaundice: Secondary | ICD-10-CM | POA: Diagnosis not present

## 2018-11-01 DIAGNOSIS — E440 Moderate protein-calorie malnutrition: Secondary | ICD-10-CM | POA: Diagnosis not present

## 2018-11-01 DIAGNOSIS — R82998 Other abnormal findings in urine: Secondary | ICD-10-CM | POA: Diagnosis not present

## 2018-11-01 DIAGNOSIS — D631 Anemia in chronic kidney disease: Secondary | ICD-10-CM | POA: Diagnosis not present

## 2018-11-01 DIAGNOSIS — Z79899 Other long term (current) drug therapy: Secondary | ICD-10-CM | POA: Diagnosis not present

## 2018-11-02 DIAGNOSIS — R17 Unspecified jaundice: Secondary | ICD-10-CM | POA: Diagnosis not present

## 2018-11-02 DIAGNOSIS — E440 Moderate protein-calorie malnutrition: Secondary | ICD-10-CM | POA: Diagnosis not present

## 2018-11-02 DIAGNOSIS — D631 Anemia in chronic kidney disease: Secondary | ICD-10-CM | POA: Diagnosis not present

## 2018-11-02 DIAGNOSIS — Z79899 Other long term (current) drug therapy: Secondary | ICD-10-CM | POA: Diagnosis not present

## 2018-11-02 DIAGNOSIS — R82998 Other abnormal findings in urine: Secondary | ICD-10-CM | POA: Diagnosis not present

## 2018-11-02 DIAGNOSIS — D509 Iron deficiency anemia, unspecified: Secondary | ICD-10-CM | POA: Diagnosis not present

## 2018-11-02 DIAGNOSIS — N186 End stage renal disease: Secondary | ICD-10-CM | POA: Diagnosis not present

## 2018-11-02 DIAGNOSIS — N2581 Secondary hyperparathyroidism of renal origin: Secondary | ICD-10-CM | POA: Diagnosis not present

## 2018-11-03 DIAGNOSIS — D631 Anemia in chronic kidney disease: Secondary | ICD-10-CM | POA: Diagnosis not present

## 2018-11-03 DIAGNOSIS — R17 Unspecified jaundice: Secondary | ICD-10-CM | POA: Diagnosis not present

## 2018-11-03 DIAGNOSIS — D509 Iron deficiency anemia, unspecified: Secondary | ICD-10-CM | POA: Diagnosis not present

## 2018-11-03 DIAGNOSIS — E440 Moderate protein-calorie malnutrition: Secondary | ICD-10-CM | POA: Diagnosis not present

## 2018-11-03 DIAGNOSIS — Z79899 Other long term (current) drug therapy: Secondary | ICD-10-CM | POA: Diagnosis not present

## 2018-11-03 DIAGNOSIS — N2581 Secondary hyperparathyroidism of renal origin: Secondary | ICD-10-CM | POA: Diagnosis not present

## 2018-11-03 DIAGNOSIS — N186 End stage renal disease: Secondary | ICD-10-CM | POA: Diagnosis not present

## 2018-11-03 DIAGNOSIS — R82998 Other abnormal findings in urine: Secondary | ICD-10-CM | POA: Diagnosis not present

## 2018-11-04 DIAGNOSIS — E440 Moderate protein-calorie malnutrition: Secondary | ICD-10-CM | POA: Diagnosis not present

## 2018-11-04 DIAGNOSIS — D509 Iron deficiency anemia, unspecified: Secondary | ICD-10-CM | POA: Diagnosis not present

## 2018-11-04 DIAGNOSIS — N2581 Secondary hyperparathyroidism of renal origin: Secondary | ICD-10-CM | POA: Diagnosis not present

## 2018-11-04 DIAGNOSIS — R17 Unspecified jaundice: Secondary | ICD-10-CM | POA: Diagnosis not present

## 2018-11-04 DIAGNOSIS — N186 End stage renal disease: Secondary | ICD-10-CM | POA: Diagnosis not present

## 2018-11-04 DIAGNOSIS — Z79899 Other long term (current) drug therapy: Secondary | ICD-10-CM | POA: Diagnosis not present

## 2018-11-04 DIAGNOSIS — R82998 Other abnormal findings in urine: Secondary | ICD-10-CM | POA: Diagnosis not present

## 2018-11-04 DIAGNOSIS — D631 Anemia in chronic kidney disease: Secondary | ICD-10-CM | POA: Diagnosis not present

## 2018-11-05 DIAGNOSIS — E440 Moderate protein-calorie malnutrition: Secondary | ICD-10-CM | POA: Diagnosis not present

## 2018-11-05 DIAGNOSIS — Z79899 Other long term (current) drug therapy: Secondary | ICD-10-CM | POA: Diagnosis not present

## 2018-11-05 DIAGNOSIS — R82998 Other abnormal findings in urine: Secondary | ICD-10-CM | POA: Diagnosis not present

## 2018-11-05 DIAGNOSIS — N2581 Secondary hyperparathyroidism of renal origin: Secondary | ICD-10-CM | POA: Diagnosis not present

## 2018-11-05 DIAGNOSIS — R17 Unspecified jaundice: Secondary | ICD-10-CM | POA: Diagnosis not present

## 2018-11-05 DIAGNOSIS — D631 Anemia in chronic kidney disease: Secondary | ICD-10-CM | POA: Diagnosis not present

## 2018-11-05 DIAGNOSIS — D509 Iron deficiency anemia, unspecified: Secondary | ICD-10-CM | POA: Diagnosis not present

## 2018-11-05 DIAGNOSIS — N186 End stage renal disease: Secondary | ICD-10-CM | POA: Diagnosis not present

## 2018-11-06 DIAGNOSIS — D631 Anemia in chronic kidney disease: Secondary | ICD-10-CM | POA: Diagnosis not present

## 2018-11-06 DIAGNOSIS — R17 Unspecified jaundice: Secondary | ICD-10-CM | POA: Diagnosis not present

## 2018-11-06 DIAGNOSIS — Z79899 Other long term (current) drug therapy: Secondary | ICD-10-CM | POA: Diagnosis not present

## 2018-11-06 DIAGNOSIS — R82998 Other abnormal findings in urine: Secondary | ICD-10-CM | POA: Diagnosis not present

## 2018-11-06 DIAGNOSIS — D509 Iron deficiency anemia, unspecified: Secondary | ICD-10-CM | POA: Diagnosis not present

## 2018-11-06 DIAGNOSIS — E440 Moderate protein-calorie malnutrition: Secondary | ICD-10-CM | POA: Diagnosis not present

## 2018-11-06 DIAGNOSIS — N186 End stage renal disease: Secondary | ICD-10-CM | POA: Diagnosis not present

## 2018-11-06 DIAGNOSIS — N2581 Secondary hyperparathyroidism of renal origin: Secondary | ICD-10-CM | POA: Diagnosis not present

## 2018-11-07 DIAGNOSIS — N2581 Secondary hyperparathyroidism of renal origin: Secondary | ICD-10-CM | POA: Diagnosis not present

## 2018-11-07 DIAGNOSIS — N186 End stage renal disease: Secondary | ICD-10-CM | POA: Diagnosis not present

## 2018-11-07 DIAGNOSIS — E440 Moderate protein-calorie malnutrition: Secondary | ICD-10-CM | POA: Diagnosis not present

## 2018-11-07 DIAGNOSIS — Z79899 Other long term (current) drug therapy: Secondary | ICD-10-CM | POA: Diagnosis not present

## 2018-11-07 DIAGNOSIS — R82998 Other abnormal findings in urine: Secondary | ICD-10-CM | POA: Diagnosis not present

## 2018-11-07 DIAGNOSIS — D631 Anemia in chronic kidney disease: Secondary | ICD-10-CM | POA: Diagnosis not present

## 2018-11-07 DIAGNOSIS — D509 Iron deficiency anemia, unspecified: Secondary | ICD-10-CM | POA: Diagnosis not present

## 2018-11-07 DIAGNOSIS — R17 Unspecified jaundice: Secondary | ICD-10-CM | POA: Diagnosis not present

## 2018-11-08 DIAGNOSIS — N2581 Secondary hyperparathyroidism of renal origin: Secondary | ICD-10-CM | POA: Diagnosis not present

## 2018-11-08 DIAGNOSIS — E440 Moderate protein-calorie malnutrition: Secondary | ICD-10-CM | POA: Diagnosis not present

## 2018-11-08 DIAGNOSIS — D631 Anemia in chronic kidney disease: Secondary | ICD-10-CM | POA: Diagnosis not present

## 2018-11-08 DIAGNOSIS — R82998 Other abnormal findings in urine: Secondary | ICD-10-CM | POA: Diagnosis not present

## 2018-11-08 DIAGNOSIS — R17 Unspecified jaundice: Secondary | ICD-10-CM | POA: Diagnosis not present

## 2018-11-08 DIAGNOSIS — D509 Iron deficiency anemia, unspecified: Secondary | ICD-10-CM | POA: Diagnosis not present

## 2018-11-08 DIAGNOSIS — Z79899 Other long term (current) drug therapy: Secondary | ICD-10-CM | POA: Diagnosis not present

## 2018-11-08 DIAGNOSIS — N186 End stage renal disease: Secondary | ICD-10-CM | POA: Diagnosis not present

## 2018-11-09 DIAGNOSIS — E440 Moderate protein-calorie malnutrition: Secondary | ICD-10-CM | POA: Diagnosis not present

## 2018-11-09 DIAGNOSIS — N186 End stage renal disease: Secondary | ICD-10-CM | POA: Diagnosis not present

## 2018-11-09 DIAGNOSIS — D631 Anemia in chronic kidney disease: Secondary | ICD-10-CM | POA: Diagnosis not present

## 2018-11-09 DIAGNOSIS — D509 Iron deficiency anemia, unspecified: Secondary | ICD-10-CM | POA: Diagnosis not present

## 2018-11-09 DIAGNOSIS — R82998 Other abnormal findings in urine: Secondary | ICD-10-CM | POA: Diagnosis not present

## 2018-11-09 DIAGNOSIS — Z79899 Other long term (current) drug therapy: Secondary | ICD-10-CM | POA: Diagnosis not present

## 2018-11-09 DIAGNOSIS — N2581 Secondary hyperparathyroidism of renal origin: Secondary | ICD-10-CM | POA: Diagnosis not present

## 2018-11-09 DIAGNOSIS — R17 Unspecified jaundice: Secondary | ICD-10-CM | POA: Diagnosis not present

## 2018-11-10 DIAGNOSIS — D631 Anemia in chronic kidney disease: Secondary | ICD-10-CM | POA: Diagnosis not present

## 2018-11-10 DIAGNOSIS — N186 End stage renal disease: Secondary | ICD-10-CM | POA: Diagnosis not present

## 2018-11-10 DIAGNOSIS — D509 Iron deficiency anemia, unspecified: Secondary | ICD-10-CM | POA: Diagnosis not present

## 2018-11-10 DIAGNOSIS — N2581 Secondary hyperparathyroidism of renal origin: Secondary | ICD-10-CM | POA: Diagnosis not present

## 2018-11-10 DIAGNOSIS — E440 Moderate protein-calorie malnutrition: Secondary | ICD-10-CM | POA: Diagnosis not present

## 2018-11-10 DIAGNOSIS — R17 Unspecified jaundice: Secondary | ICD-10-CM | POA: Diagnosis not present

## 2018-11-10 DIAGNOSIS — R82998 Other abnormal findings in urine: Secondary | ICD-10-CM | POA: Diagnosis not present

## 2018-11-10 DIAGNOSIS — Z79899 Other long term (current) drug therapy: Secondary | ICD-10-CM | POA: Diagnosis not present

## 2018-11-11 DIAGNOSIS — R17 Unspecified jaundice: Secondary | ICD-10-CM | POA: Diagnosis not present

## 2018-11-11 DIAGNOSIS — E440 Moderate protein-calorie malnutrition: Secondary | ICD-10-CM | POA: Diagnosis not present

## 2018-11-11 DIAGNOSIS — Z79899 Other long term (current) drug therapy: Secondary | ICD-10-CM | POA: Diagnosis not present

## 2018-11-11 DIAGNOSIS — N2581 Secondary hyperparathyroidism of renal origin: Secondary | ICD-10-CM | POA: Diagnosis not present

## 2018-11-11 DIAGNOSIS — D631 Anemia in chronic kidney disease: Secondary | ICD-10-CM | POA: Diagnosis not present

## 2018-11-11 DIAGNOSIS — N186 End stage renal disease: Secondary | ICD-10-CM | POA: Diagnosis not present

## 2018-11-11 DIAGNOSIS — R82998 Other abnormal findings in urine: Secondary | ICD-10-CM | POA: Diagnosis not present

## 2018-11-11 DIAGNOSIS — D509 Iron deficiency anemia, unspecified: Secondary | ICD-10-CM | POA: Diagnosis not present

## 2018-11-12 DIAGNOSIS — N186 End stage renal disease: Secondary | ICD-10-CM | POA: Diagnosis not present

## 2018-11-12 DIAGNOSIS — E440 Moderate protein-calorie malnutrition: Secondary | ICD-10-CM | POA: Diagnosis not present

## 2018-11-12 DIAGNOSIS — D631 Anemia in chronic kidney disease: Secondary | ICD-10-CM | POA: Diagnosis not present

## 2018-11-12 DIAGNOSIS — R82998 Other abnormal findings in urine: Secondary | ICD-10-CM | POA: Diagnosis not present

## 2018-11-12 DIAGNOSIS — D509 Iron deficiency anemia, unspecified: Secondary | ICD-10-CM | POA: Diagnosis not present

## 2018-11-12 DIAGNOSIS — R17 Unspecified jaundice: Secondary | ICD-10-CM | POA: Diagnosis not present

## 2018-11-12 DIAGNOSIS — Z79899 Other long term (current) drug therapy: Secondary | ICD-10-CM | POA: Diagnosis not present

## 2018-11-12 DIAGNOSIS — N2581 Secondary hyperparathyroidism of renal origin: Secondary | ICD-10-CM | POA: Diagnosis not present

## 2018-11-13 DIAGNOSIS — D509 Iron deficiency anemia, unspecified: Secondary | ICD-10-CM | POA: Diagnosis not present

## 2018-11-13 DIAGNOSIS — N186 End stage renal disease: Secondary | ICD-10-CM | POA: Diagnosis not present

## 2018-11-13 DIAGNOSIS — R17 Unspecified jaundice: Secondary | ICD-10-CM | POA: Diagnosis not present

## 2018-11-13 DIAGNOSIS — D631 Anemia in chronic kidney disease: Secondary | ICD-10-CM | POA: Diagnosis not present

## 2018-11-13 DIAGNOSIS — Z79899 Other long term (current) drug therapy: Secondary | ICD-10-CM | POA: Diagnosis not present

## 2018-11-13 DIAGNOSIS — E440 Moderate protein-calorie malnutrition: Secondary | ICD-10-CM | POA: Diagnosis not present

## 2018-11-13 DIAGNOSIS — R82998 Other abnormal findings in urine: Secondary | ICD-10-CM | POA: Diagnosis not present

## 2018-11-13 DIAGNOSIS — N2581 Secondary hyperparathyroidism of renal origin: Secondary | ICD-10-CM | POA: Diagnosis not present

## 2018-11-14 DIAGNOSIS — D631 Anemia in chronic kidney disease: Secondary | ICD-10-CM | POA: Diagnosis not present

## 2018-11-14 DIAGNOSIS — E440 Moderate protein-calorie malnutrition: Secondary | ICD-10-CM | POA: Diagnosis not present

## 2018-11-14 DIAGNOSIS — R82998 Other abnormal findings in urine: Secondary | ICD-10-CM | POA: Diagnosis not present

## 2018-11-14 DIAGNOSIS — D509 Iron deficiency anemia, unspecified: Secondary | ICD-10-CM | POA: Diagnosis not present

## 2018-11-14 DIAGNOSIS — N186 End stage renal disease: Secondary | ICD-10-CM | POA: Diagnosis not present

## 2018-11-14 DIAGNOSIS — R17 Unspecified jaundice: Secondary | ICD-10-CM | POA: Diagnosis not present

## 2018-11-14 DIAGNOSIS — N2581 Secondary hyperparathyroidism of renal origin: Secondary | ICD-10-CM | POA: Diagnosis not present

## 2018-11-14 DIAGNOSIS — Z79899 Other long term (current) drug therapy: Secondary | ICD-10-CM | POA: Diagnosis not present

## 2018-11-15 DIAGNOSIS — N186 End stage renal disease: Secondary | ICD-10-CM | POA: Diagnosis not present

## 2018-11-15 DIAGNOSIS — N2581 Secondary hyperparathyroidism of renal origin: Secondary | ICD-10-CM | POA: Diagnosis not present

## 2018-11-15 DIAGNOSIS — D631 Anemia in chronic kidney disease: Secondary | ICD-10-CM | POA: Diagnosis not present

## 2018-11-15 DIAGNOSIS — E440 Moderate protein-calorie malnutrition: Secondary | ICD-10-CM | POA: Diagnosis not present

## 2018-11-15 DIAGNOSIS — D509 Iron deficiency anemia, unspecified: Secondary | ICD-10-CM | POA: Diagnosis not present

## 2018-11-15 DIAGNOSIS — R82998 Other abnormal findings in urine: Secondary | ICD-10-CM | POA: Diagnosis not present

## 2018-11-15 DIAGNOSIS — R17 Unspecified jaundice: Secondary | ICD-10-CM | POA: Diagnosis not present

## 2018-11-15 DIAGNOSIS — Z79899 Other long term (current) drug therapy: Secondary | ICD-10-CM | POA: Diagnosis not present

## 2018-11-16 DIAGNOSIS — R82998 Other abnormal findings in urine: Secondary | ICD-10-CM | POA: Diagnosis not present

## 2018-11-16 DIAGNOSIS — N186 End stage renal disease: Secondary | ICD-10-CM | POA: Diagnosis not present

## 2018-11-16 DIAGNOSIS — E440 Moderate protein-calorie malnutrition: Secondary | ICD-10-CM | POA: Diagnosis not present

## 2018-11-16 DIAGNOSIS — D631 Anemia in chronic kidney disease: Secondary | ICD-10-CM | POA: Diagnosis not present

## 2018-11-16 DIAGNOSIS — Z79899 Other long term (current) drug therapy: Secondary | ICD-10-CM | POA: Diagnosis not present

## 2018-11-16 DIAGNOSIS — R17 Unspecified jaundice: Secondary | ICD-10-CM | POA: Diagnosis not present

## 2018-11-16 DIAGNOSIS — N2581 Secondary hyperparathyroidism of renal origin: Secondary | ICD-10-CM | POA: Diagnosis not present

## 2018-11-16 DIAGNOSIS — D509 Iron deficiency anemia, unspecified: Secondary | ICD-10-CM | POA: Diagnosis not present

## 2018-11-17 DIAGNOSIS — E440 Moderate protein-calorie malnutrition: Secondary | ICD-10-CM | POA: Diagnosis not present

## 2018-11-17 DIAGNOSIS — D631 Anemia in chronic kidney disease: Secondary | ICD-10-CM | POA: Diagnosis not present

## 2018-11-17 DIAGNOSIS — N186 End stage renal disease: Secondary | ICD-10-CM | POA: Diagnosis not present

## 2018-11-17 DIAGNOSIS — D509 Iron deficiency anemia, unspecified: Secondary | ICD-10-CM | POA: Diagnosis not present

## 2018-11-17 DIAGNOSIS — R82998 Other abnormal findings in urine: Secondary | ICD-10-CM | POA: Diagnosis not present

## 2018-11-17 DIAGNOSIS — R17 Unspecified jaundice: Secondary | ICD-10-CM | POA: Diagnosis not present

## 2018-11-17 DIAGNOSIS — Z79899 Other long term (current) drug therapy: Secondary | ICD-10-CM | POA: Diagnosis not present

## 2018-11-17 DIAGNOSIS — N2581 Secondary hyperparathyroidism of renal origin: Secondary | ICD-10-CM | POA: Diagnosis not present

## 2018-11-18 DIAGNOSIS — D509 Iron deficiency anemia, unspecified: Secondary | ICD-10-CM | POA: Diagnosis not present

## 2018-11-18 DIAGNOSIS — E440 Moderate protein-calorie malnutrition: Secondary | ICD-10-CM | POA: Diagnosis not present

## 2018-11-18 DIAGNOSIS — R17 Unspecified jaundice: Secondary | ICD-10-CM | POA: Diagnosis not present

## 2018-11-18 DIAGNOSIS — Z79899 Other long term (current) drug therapy: Secondary | ICD-10-CM | POA: Diagnosis not present

## 2018-11-18 DIAGNOSIS — R82998 Other abnormal findings in urine: Secondary | ICD-10-CM | POA: Diagnosis not present

## 2018-11-18 DIAGNOSIS — N2581 Secondary hyperparathyroidism of renal origin: Secondary | ICD-10-CM | POA: Diagnosis not present

## 2018-11-18 DIAGNOSIS — N186 End stage renal disease: Secondary | ICD-10-CM | POA: Diagnosis not present

## 2018-11-18 DIAGNOSIS — D631 Anemia in chronic kidney disease: Secondary | ICD-10-CM | POA: Diagnosis not present

## 2018-11-19 DIAGNOSIS — E1122 Type 2 diabetes mellitus with diabetic chronic kidney disease: Secondary | ICD-10-CM | POA: Diagnosis not present

## 2018-11-19 DIAGNOSIS — E440 Moderate protein-calorie malnutrition: Secondary | ICD-10-CM | POA: Diagnosis not present

## 2018-11-19 DIAGNOSIS — N2581 Secondary hyperparathyroidism of renal origin: Secondary | ICD-10-CM | POA: Diagnosis not present

## 2018-11-19 DIAGNOSIS — R82998 Other abnormal findings in urine: Secondary | ICD-10-CM | POA: Diagnosis not present

## 2018-11-19 DIAGNOSIS — D509 Iron deficiency anemia, unspecified: Secondary | ICD-10-CM | POA: Diagnosis not present

## 2018-11-19 DIAGNOSIS — R17 Unspecified jaundice: Secondary | ICD-10-CM | POA: Diagnosis not present

## 2018-11-19 DIAGNOSIS — Z992 Dependence on renal dialysis: Secondary | ICD-10-CM | POA: Diagnosis not present

## 2018-11-19 DIAGNOSIS — N186 End stage renal disease: Secondary | ICD-10-CM | POA: Diagnosis not present

## 2018-11-19 DIAGNOSIS — D631 Anemia in chronic kidney disease: Secondary | ICD-10-CM | POA: Diagnosis not present

## 2018-11-19 DIAGNOSIS — Z79899 Other long term (current) drug therapy: Secondary | ICD-10-CM | POA: Diagnosis not present

## 2018-11-20 DIAGNOSIS — N2589 Other disorders resulting from impaired renal tubular function: Secondary | ICD-10-CM | POA: Diagnosis not present

## 2018-11-20 DIAGNOSIS — N2581 Secondary hyperparathyroidism of renal origin: Secondary | ICD-10-CM | POA: Diagnosis not present

## 2018-11-20 DIAGNOSIS — D513 Other dietary vitamin B12 deficiency anemia: Secondary | ICD-10-CM | POA: Diagnosis not present

## 2018-11-20 DIAGNOSIS — E119 Type 2 diabetes mellitus without complications: Secondary | ICD-10-CM | POA: Diagnosis not present

## 2018-11-20 DIAGNOSIS — Z4932 Encounter for adequacy testing for peritoneal dialysis: Secondary | ICD-10-CM | POA: Diagnosis not present

## 2018-11-20 DIAGNOSIS — E440 Moderate protein-calorie malnutrition: Secondary | ICD-10-CM | POA: Diagnosis not present

## 2018-11-20 DIAGNOSIS — E7849 Other hyperlipidemia: Secondary | ICD-10-CM | POA: Diagnosis not present

## 2018-11-20 DIAGNOSIS — D509 Iron deficiency anemia, unspecified: Secondary | ICD-10-CM | POA: Diagnosis not present

## 2018-11-20 DIAGNOSIS — N186 End stage renal disease: Secondary | ICD-10-CM | POA: Diagnosis not present

## 2018-11-20 DIAGNOSIS — D631 Anemia in chronic kidney disease: Secondary | ICD-10-CM | POA: Diagnosis not present

## 2018-11-21 DIAGNOSIS — D509 Iron deficiency anemia, unspecified: Secondary | ICD-10-CM | POA: Diagnosis not present

## 2018-11-21 DIAGNOSIS — Z4932 Encounter for adequacy testing for peritoneal dialysis: Secondary | ICD-10-CM | POA: Diagnosis not present

## 2018-11-21 DIAGNOSIS — E7849 Other hyperlipidemia: Secondary | ICD-10-CM | POA: Diagnosis not present

## 2018-11-21 DIAGNOSIS — N2581 Secondary hyperparathyroidism of renal origin: Secondary | ICD-10-CM | POA: Diagnosis not present

## 2018-11-21 DIAGNOSIS — D631 Anemia in chronic kidney disease: Secondary | ICD-10-CM | POA: Diagnosis not present

## 2018-11-21 DIAGNOSIS — N186 End stage renal disease: Secondary | ICD-10-CM | POA: Diagnosis not present

## 2018-11-21 DIAGNOSIS — D513 Other dietary vitamin B12 deficiency anemia: Secondary | ICD-10-CM | POA: Diagnosis not present

## 2018-11-21 DIAGNOSIS — N2589 Other disorders resulting from impaired renal tubular function: Secondary | ICD-10-CM | POA: Diagnosis not present

## 2018-11-21 DIAGNOSIS — E119 Type 2 diabetes mellitus without complications: Secondary | ICD-10-CM | POA: Diagnosis not present

## 2018-11-21 DIAGNOSIS — E440 Moderate protein-calorie malnutrition: Secondary | ICD-10-CM | POA: Diagnosis not present

## 2018-11-22 DIAGNOSIS — E119 Type 2 diabetes mellitus without complications: Secondary | ICD-10-CM | POA: Diagnosis not present

## 2018-11-22 DIAGNOSIS — E440 Moderate protein-calorie malnutrition: Secondary | ICD-10-CM | POA: Diagnosis not present

## 2018-11-22 DIAGNOSIS — D509 Iron deficiency anemia, unspecified: Secondary | ICD-10-CM | POA: Diagnosis not present

## 2018-11-22 DIAGNOSIS — D631 Anemia in chronic kidney disease: Secondary | ICD-10-CM | POA: Diagnosis not present

## 2018-11-22 DIAGNOSIS — Z4932 Encounter for adequacy testing for peritoneal dialysis: Secondary | ICD-10-CM | POA: Diagnosis not present

## 2018-11-22 DIAGNOSIS — N2589 Other disorders resulting from impaired renal tubular function: Secondary | ICD-10-CM | POA: Diagnosis not present

## 2018-11-22 DIAGNOSIS — D513 Other dietary vitamin B12 deficiency anemia: Secondary | ICD-10-CM | POA: Diagnosis not present

## 2018-11-22 DIAGNOSIS — N186 End stage renal disease: Secondary | ICD-10-CM | POA: Diagnosis not present

## 2018-11-22 DIAGNOSIS — N2581 Secondary hyperparathyroidism of renal origin: Secondary | ICD-10-CM | POA: Diagnosis not present

## 2018-11-22 DIAGNOSIS — E7849 Other hyperlipidemia: Secondary | ICD-10-CM | POA: Diagnosis not present

## 2018-11-23 DIAGNOSIS — E119 Type 2 diabetes mellitus without complications: Secondary | ICD-10-CM | POA: Diagnosis not present

## 2018-11-23 DIAGNOSIS — E7849 Other hyperlipidemia: Secondary | ICD-10-CM | POA: Diagnosis not present

## 2018-11-23 DIAGNOSIS — D631 Anemia in chronic kidney disease: Secondary | ICD-10-CM | POA: Diagnosis not present

## 2018-11-23 DIAGNOSIS — D509 Iron deficiency anemia, unspecified: Secondary | ICD-10-CM | POA: Diagnosis not present

## 2018-11-23 DIAGNOSIS — E440 Moderate protein-calorie malnutrition: Secondary | ICD-10-CM | POA: Diagnosis not present

## 2018-11-23 DIAGNOSIS — D513 Other dietary vitamin B12 deficiency anemia: Secondary | ICD-10-CM | POA: Diagnosis not present

## 2018-11-23 DIAGNOSIS — N2581 Secondary hyperparathyroidism of renal origin: Secondary | ICD-10-CM | POA: Diagnosis not present

## 2018-11-23 DIAGNOSIS — Z4932 Encounter for adequacy testing for peritoneal dialysis: Secondary | ICD-10-CM | POA: Diagnosis not present

## 2018-11-23 DIAGNOSIS — N2589 Other disorders resulting from impaired renal tubular function: Secondary | ICD-10-CM | POA: Diagnosis not present

## 2018-11-23 DIAGNOSIS — N186 End stage renal disease: Secondary | ICD-10-CM | POA: Diagnosis not present

## 2018-11-24 DIAGNOSIS — D509 Iron deficiency anemia, unspecified: Secondary | ICD-10-CM | POA: Diagnosis not present

## 2018-11-24 DIAGNOSIS — D513 Other dietary vitamin B12 deficiency anemia: Secondary | ICD-10-CM | POA: Diagnosis not present

## 2018-11-24 DIAGNOSIS — D631 Anemia in chronic kidney disease: Secondary | ICD-10-CM | POA: Diagnosis not present

## 2018-11-24 DIAGNOSIS — N2589 Other disorders resulting from impaired renal tubular function: Secondary | ICD-10-CM | POA: Diagnosis not present

## 2018-11-24 DIAGNOSIS — N186 End stage renal disease: Secondary | ICD-10-CM | POA: Diagnosis not present

## 2018-11-24 DIAGNOSIS — E119 Type 2 diabetes mellitus without complications: Secondary | ICD-10-CM | POA: Diagnosis not present

## 2018-11-24 DIAGNOSIS — Z4932 Encounter for adequacy testing for peritoneal dialysis: Secondary | ICD-10-CM | POA: Diagnosis not present

## 2018-11-24 DIAGNOSIS — E7849 Other hyperlipidemia: Secondary | ICD-10-CM | POA: Diagnosis not present

## 2018-11-24 DIAGNOSIS — N2581 Secondary hyperparathyroidism of renal origin: Secondary | ICD-10-CM | POA: Diagnosis not present

## 2018-11-24 DIAGNOSIS — E440 Moderate protein-calorie malnutrition: Secondary | ICD-10-CM | POA: Diagnosis not present

## 2018-11-25 DIAGNOSIS — E440 Moderate protein-calorie malnutrition: Secondary | ICD-10-CM | POA: Diagnosis not present

## 2018-11-25 DIAGNOSIS — D509 Iron deficiency anemia, unspecified: Secondary | ICD-10-CM | POA: Diagnosis not present

## 2018-11-25 DIAGNOSIS — N2581 Secondary hyperparathyroidism of renal origin: Secondary | ICD-10-CM | POA: Diagnosis not present

## 2018-11-25 DIAGNOSIS — N186 End stage renal disease: Secondary | ICD-10-CM | POA: Diagnosis not present

## 2018-11-25 DIAGNOSIS — Z4932 Encounter for adequacy testing for peritoneal dialysis: Secondary | ICD-10-CM | POA: Diagnosis not present

## 2018-11-25 DIAGNOSIS — E7849 Other hyperlipidemia: Secondary | ICD-10-CM | POA: Diagnosis not present

## 2018-11-25 DIAGNOSIS — N2589 Other disorders resulting from impaired renal tubular function: Secondary | ICD-10-CM | POA: Diagnosis not present

## 2018-11-25 DIAGNOSIS — D631 Anemia in chronic kidney disease: Secondary | ICD-10-CM | POA: Diagnosis not present

## 2018-11-25 DIAGNOSIS — E119 Type 2 diabetes mellitus without complications: Secondary | ICD-10-CM | POA: Diagnosis not present

## 2018-11-25 DIAGNOSIS — D513 Other dietary vitamin B12 deficiency anemia: Secondary | ICD-10-CM | POA: Diagnosis not present

## 2018-11-26 DIAGNOSIS — N2581 Secondary hyperparathyroidism of renal origin: Secondary | ICD-10-CM | POA: Diagnosis not present

## 2018-11-26 DIAGNOSIS — Z4932 Encounter for adequacy testing for peritoneal dialysis: Secondary | ICD-10-CM | POA: Diagnosis not present

## 2018-11-26 DIAGNOSIS — N186 End stage renal disease: Secondary | ICD-10-CM | POA: Diagnosis not present

## 2018-11-26 DIAGNOSIS — E440 Moderate protein-calorie malnutrition: Secondary | ICD-10-CM | POA: Diagnosis not present

## 2018-11-26 DIAGNOSIS — D631 Anemia in chronic kidney disease: Secondary | ICD-10-CM | POA: Diagnosis not present

## 2018-11-26 DIAGNOSIS — D509 Iron deficiency anemia, unspecified: Secondary | ICD-10-CM | POA: Diagnosis not present

## 2018-11-26 DIAGNOSIS — D513 Other dietary vitamin B12 deficiency anemia: Secondary | ICD-10-CM | POA: Diagnosis not present

## 2018-11-26 DIAGNOSIS — E119 Type 2 diabetes mellitus without complications: Secondary | ICD-10-CM | POA: Diagnosis not present

## 2018-11-26 DIAGNOSIS — N2589 Other disorders resulting from impaired renal tubular function: Secondary | ICD-10-CM | POA: Diagnosis not present

## 2018-11-26 DIAGNOSIS — E7849 Other hyperlipidemia: Secondary | ICD-10-CM | POA: Diagnosis not present

## 2018-11-27 DIAGNOSIS — N186 End stage renal disease: Secondary | ICD-10-CM | POA: Diagnosis not present

## 2018-11-27 DIAGNOSIS — N2589 Other disorders resulting from impaired renal tubular function: Secondary | ICD-10-CM | POA: Diagnosis not present

## 2018-11-27 DIAGNOSIS — D631 Anemia in chronic kidney disease: Secondary | ICD-10-CM | POA: Diagnosis not present

## 2018-11-27 DIAGNOSIS — E440 Moderate protein-calorie malnutrition: Secondary | ICD-10-CM | POA: Diagnosis not present

## 2018-11-27 DIAGNOSIS — D513 Other dietary vitamin B12 deficiency anemia: Secondary | ICD-10-CM | POA: Diagnosis not present

## 2018-11-27 DIAGNOSIS — E7849 Other hyperlipidemia: Secondary | ICD-10-CM | POA: Diagnosis not present

## 2018-11-27 DIAGNOSIS — Z4932 Encounter for adequacy testing for peritoneal dialysis: Secondary | ICD-10-CM | POA: Diagnosis not present

## 2018-11-27 DIAGNOSIS — D509 Iron deficiency anemia, unspecified: Secondary | ICD-10-CM | POA: Diagnosis not present

## 2018-11-27 DIAGNOSIS — N2581 Secondary hyperparathyroidism of renal origin: Secondary | ICD-10-CM | POA: Diagnosis not present

## 2018-11-27 DIAGNOSIS — E119 Type 2 diabetes mellitus without complications: Secondary | ICD-10-CM | POA: Diagnosis not present

## 2018-11-28 DIAGNOSIS — N2581 Secondary hyperparathyroidism of renal origin: Secondary | ICD-10-CM | POA: Diagnosis not present

## 2018-11-28 DIAGNOSIS — D509 Iron deficiency anemia, unspecified: Secondary | ICD-10-CM | POA: Diagnosis not present

## 2018-11-28 DIAGNOSIS — N186 End stage renal disease: Secondary | ICD-10-CM | POA: Diagnosis not present

## 2018-11-28 DIAGNOSIS — E119 Type 2 diabetes mellitus without complications: Secondary | ICD-10-CM | POA: Diagnosis not present

## 2018-11-28 DIAGNOSIS — N2589 Other disorders resulting from impaired renal tubular function: Secondary | ICD-10-CM | POA: Diagnosis not present

## 2018-11-28 DIAGNOSIS — D513 Other dietary vitamin B12 deficiency anemia: Secondary | ICD-10-CM | POA: Diagnosis not present

## 2018-11-28 DIAGNOSIS — E7849 Other hyperlipidemia: Secondary | ICD-10-CM | POA: Diagnosis not present

## 2018-11-28 DIAGNOSIS — Z4932 Encounter for adequacy testing for peritoneal dialysis: Secondary | ICD-10-CM | POA: Diagnosis not present

## 2018-11-28 DIAGNOSIS — D631 Anemia in chronic kidney disease: Secondary | ICD-10-CM | POA: Diagnosis not present

## 2018-11-28 DIAGNOSIS — E440 Moderate protein-calorie malnutrition: Secondary | ICD-10-CM | POA: Diagnosis not present

## 2018-11-29 DIAGNOSIS — N2589 Other disorders resulting from impaired renal tubular function: Secondary | ICD-10-CM | POA: Diagnosis not present

## 2018-11-29 DIAGNOSIS — N186 End stage renal disease: Secondary | ICD-10-CM | POA: Diagnosis not present

## 2018-11-29 DIAGNOSIS — Z4932 Encounter for adequacy testing for peritoneal dialysis: Secondary | ICD-10-CM | POA: Diagnosis not present

## 2018-11-29 DIAGNOSIS — D509 Iron deficiency anemia, unspecified: Secondary | ICD-10-CM | POA: Diagnosis not present

## 2018-11-29 DIAGNOSIS — N2581 Secondary hyperparathyroidism of renal origin: Secondary | ICD-10-CM | POA: Diagnosis not present

## 2018-11-29 DIAGNOSIS — E119 Type 2 diabetes mellitus without complications: Secondary | ICD-10-CM | POA: Diagnosis not present

## 2018-11-29 DIAGNOSIS — E7849 Other hyperlipidemia: Secondary | ICD-10-CM | POA: Diagnosis not present

## 2018-11-29 DIAGNOSIS — D513 Other dietary vitamin B12 deficiency anemia: Secondary | ICD-10-CM | POA: Diagnosis not present

## 2018-11-29 DIAGNOSIS — E440 Moderate protein-calorie malnutrition: Secondary | ICD-10-CM | POA: Diagnosis not present

## 2018-11-29 DIAGNOSIS — D631 Anemia in chronic kidney disease: Secondary | ICD-10-CM | POA: Diagnosis not present

## 2018-11-30 DIAGNOSIS — D509 Iron deficiency anemia, unspecified: Secondary | ICD-10-CM | POA: Diagnosis not present

## 2018-11-30 DIAGNOSIS — E119 Type 2 diabetes mellitus without complications: Secondary | ICD-10-CM | POA: Diagnosis not present

## 2018-11-30 DIAGNOSIS — E440 Moderate protein-calorie malnutrition: Secondary | ICD-10-CM | POA: Diagnosis not present

## 2018-11-30 DIAGNOSIS — N186 End stage renal disease: Secondary | ICD-10-CM | POA: Diagnosis not present

## 2018-11-30 DIAGNOSIS — Z4932 Encounter for adequacy testing for peritoneal dialysis: Secondary | ICD-10-CM | POA: Diagnosis not present

## 2018-11-30 DIAGNOSIS — E7849 Other hyperlipidemia: Secondary | ICD-10-CM | POA: Diagnosis not present

## 2018-11-30 DIAGNOSIS — N2589 Other disorders resulting from impaired renal tubular function: Secondary | ICD-10-CM | POA: Diagnosis not present

## 2018-11-30 DIAGNOSIS — D513 Other dietary vitamin B12 deficiency anemia: Secondary | ICD-10-CM | POA: Diagnosis not present

## 2018-11-30 DIAGNOSIS — D631 Anemia in chronic kidney disease: Secondary | ICD-10-CM | POA: Diagnosis not present

## 2018-11-30 DIAGNOSIS — N2581 Secondary hyperparathyroidism of renal origin: Secondary | ICD-10-CM | POA: Diagnosis not present

## 2018-12-01 DIAGNOSIS — D631 Anemia in chronic kidney disease: Secondary | ICD-10-CM | POA: Diagnosis not present

## 2018-12-01 DIAGNOSIS — D513 Other dietary vitamin B12 deficiency anemia: Secondary | ICD-10-CM | POA: Diagnosis not present

## 2018-12-01 DIAGNOSIS — N186 End stage renal disease: Secondary | ICD-10-CM | POA: Diagnosis not present

## 2018-12-01 DIAGNOSIS — N2581 Secondary hyperparathyroidism of renal origin: Secondary | ICD-10-CM | POA: Diagnosis not present

## 2018-12-01 DIAGNOSIS — Z4932 Encounter for adequacy testing for peritoneal dialysis: Secondary | ICD-10-CM | POA: Diagnosis not present

## 2018-12-01 DIAGNOSIS — E7849 Other hyperlipidemia: Secondary | ICD-10-CM | POA: Diagnosis not present

## 2018-12-01 DIAGNOSIS — E440 Moderate protein-calorie malnutrition: Secondary | ICD-10-CM | POA: Diagnosis not present

## 2018-12-01 DIAGNOSIS — N2589 Other disorders resulting from impaired renal tubular function: Secondary | ICD-10-CM | POA: Diagnosis not present

## 2018-12-01 DIAGNOSIS — E119 Type 2 diabetes mellitus without complications: Secondary | ICD-10-CM | POA: Diagnosis not present

## 2018-12-01 DIAGNOSIS — D509 Iron deficiency anemia, unspecified: Secondary | ICD-10-CM | POA: Diagnosis not present

## 2018-12-02 DIAGNOSIS — E7849 Other hyperlipidemia: Secondary | ICD-10-CM | POA: Diagnosis not present

## 2018-12-02 DIAGNOSIS — N2589 Other disorders resulting from impaired renal tubular function: Secondary | ICD-10-CM | POA: Diagnosis not present

## 2018-12-02 DIAGNOSIS — E440 Moderate protein-calorie malnutrition: Secondary | ICD-10-CM | POA: Diagnosis not present

## 2018-12-02 DIAGNOSIS — Z4932 Encounter for adequacy testing for peritoneal dialysis: Secondary | ICD-10-CM | POA: Diagnosis not present

## 2018-12-02 DIAGNOSIS — E119 Type 2 diabetes mellitus without complications: Secondary | ICD-10-CM | POA: Diagnosis not present

## 2018-12-02 DIAGNOSIS — D631 Anemia in chronic kidney disease: Secondary | ICD-10-CM | POA: Diagnosis not present

## 2018-12-02 DIAGNOSIS — N186 End stage renal disease: Secondary | ICD-10-CM | POA: Diagnosis not present

## 2018-12-02 DIAGNOSIS — D513 Other dietary vitamin B12 deficiency anemia: Secondary | ICD-10-CM | POA: Diagnosis not present

## 2018-12-02 DIAGNOSIS — D509 Iron deficiency anemia, unspecified: Secondary | ICD-10-CM | POA: Diagnosis not present

## 2018-12-02 DIAGNOSIS — N2581 Secondary hyperparathyroidism of renal origin: Secondary | ICD-10-CM | POA: Diagnosis not present

## 2018-12-03 DIAGNOSIS — E119 Type 2 diabetes mellitus without complications: Secondary | ICD-10-CM | POA: Diagnosis not present

## 2018-12-03 DIAGNOSIS — D513 Other dietary vitamin B12 deficiency anemia: Secondary | ICD-10-CM | POA: Diagnosis not present

## 2018-12-03 DIAGNOSIS — N2589 Other disorders resulting from impaired renal tubular function: Secondary | ICD-10-CM | POA: Diagnosis not present

## 2018-12-03 DIAGNOSIS — E7849 Other hyperlipidemia: Secondary | ICD-10-CM | POA: Diagnosis not present

## 2018-12-03 DIAGNOSIS — E440 Moderate protein-calorie malnutrition: Secondary | ICD-10-CM | POA: Diagnosis not present

## 2018-12-03 DIAGNOSIS — D509 Iron deficiency anemia, unspecified: Secondary | ICD-10-CM | POA: Diagnosis not present

## 2018-12-03 DIAGNOSIS — N2581 Secondary hyperparathyroidism of renal origin: Secondary | ICD-10-CM | POA: Diagnosis not present

## 2018-12-03 DIAGNOSIS — Z4932 Encounter for adequacy testing for peritoneal dialysis: Secondary | ICD-10-CM | POA: Diagnosis not present

## 2018-12-03 DIAGNOSIS — D631 Anemia in chronic kidney disease: Secondary | ICD-10-CM | POA: Diagnosis not present

## 2018-12-03 DIAGNOSIS — N186 End stage renal disease: Secondary | ICD-10-CM | POA: Diagnosis not present

## 2018-12-04 DIAGNOSIS — N2581 Secondary hyperparathyroidism of renal origin: Secondary | ICD-10-CM | POA: Diagnosis not present

## 2018-12-04 DIAGNOSIS — D631 Anemia in chronic kidney disease: Secondary | ICD-10-CM | POA: Diagnosis not present

## 2018-12-04 DIAGNOSIS — N2589 Other disorders resulting from impaired renal tubular function: Secondary | ICD-10-CM | POA: Diagnosis not present

## 2018-12-04 DIAGNOSIS — D509 Iron deficiency anemia, unspecified: Secondary | ICD-10-CM | POA: Diagnosis not present

## 2018-12-04 DIAGNOSIS — N186 End stage renal disease: Secondary | ICD-10-CM | POA: Diagnosis not present

## 2018-12-04 DIAGNOSIS — Z4932 Encounter for adequacy testing for peritoneal dialysis: Secondary | ICD-10-CM | POA: Diagnosis not present

## 2018-12-04 DIAGNOSIS — E440 Moderate protein-calorie malnutrition: Secondary | ICD-10-CM | POA: Diagnosis not present

## 2018-12-04 DIAGNOSIS — D513 Other dietary vitamin B12 deficiency anemia: Secondary | ICD-10-CM | POA: Diagnosis not present

## 2018-12-04 DIAGNOSIS — E7849 Other hyperlipidemia: Secondary | ICD-10-CM | POA: Diagnosis not present

## 2018-12-04 DIAGNOSIS — E119 Type 2 diabetes mellitus without complications: Secondary | ICD-10-CM | POA: Diagnosis not present

## 2018-12-05 DIAGNOSIS — Z4932 Encounter for adequacy testing for peritoneal dialysis: Secondary | ICD-10-CM | POA: Diagnosis not present

## 2018-12-05 DIAGNOSIS — E119 Type 2 diabetes mellitus without complications: Secondary | ICD-10-CM | POA: Diagnosis not present

## 2018-12-05 DIAGNOSIS — E440 Moderate protein-calorie malnutrition: Secondary | ICD-10-CM | POA: Diagnosis not present

## 2018-12-05 DIAGNOSIS — N2581 Secondary hyperparathyroidism of renal origin: Secondary | ICD-10-CM | POA: Diagnosis not present

## 2018-12-05 DIAGNOSIS — N2589 Other disorders resulting from impaired renal tubular function: Secondary | ICD-10-CM | POA: Diagnosis not present

## 2018-12-05 DIAGNOSIS — D509 Iron deficiency anemia, unspecified: Secondary | ICD-10-CM | POA: Diagnosis not present

## 2018-12-05 DIAGNOSIS — D631 Anemia in chronic kidney disease: Secondary | ICD-10-CM | POA: Diagnosis not present

## 2018-12-05 DIAGNOSIS — D513 Other dietary vitamin B12 deficiency anemia: Secondary | ICD-10-CM | POA: Diagnosis not present

## 2018-12-05 DIAGNOSIS — E7849 Other hyperlipidemia: Secondary | ICD-10-CM | POA: Diagnosis not present

## 2018-12-05 DIAGNOSIS — N186 End stage renal disease: Secondary | ICD-10-CM | POA: Diagnosis not present

## 2018-12-06 DIAGNOSIS — N2581 Secondary hyperparathyroidism of renal origin: Secondary | ICD-10-CM | POA: Diagnosis not present

## 2018-12-06 DIAGNOSIS — D509 Iron deficiency anemia, unspecified: Secondary | ICD-10-CM | POA: Diagnosis not present

## 2018-12-06 DIAGNOSIS — E119 Type 2 diabetes mellitus without complications: Secondary | ICD-10-CM | POA: Diagnosis not present

## 2018-12-06 DIAGNOSIS — N2589 Other disorders resulting from impaired renal tubular function: Secondary | ICD-10-CM | POA: Diagnosis not present

## 2018-12-06 DIAGNOSIS — D631 Anemia in chronic kidney disease: Secondary | ICD-10-CM | POA: Diagnosis not present

## 2018-12-06 DIAGNOSIS — Z4932 Encounter for adequacy testing for peritoneal dialysis: Secondary | ICD-10-CM | POA: Diagnosis not present

## 2018-12-06 DIAGNOSIS — E7849 Other hyperlipidemia: Secondary | ICD-10-CM | POA: Diagnosis not present

## 2018-12-06 DIAGNOSIS — E440 Moderate protein-calorie malnutrition: Secondary | ICD-10-CM | POA: Diagnosis not present

## 2018-12-06 DIAGNOSIS — D513 Other dietary vitamin B12 deficiency anemia: Secondary | ICD-10-CM | POA: Diagnosis not present

## 2018-12-06 DIAGNOSIS — N186 End stage renal disease: Secondary | ICD-10-CM | POA: Diagnosis not present

## 2018-12-07 DIAGNOSIS — E119 Type 2 diabetes mellitus without complications: Secondary | ICD-10-CM | POA: Diagnosis not present

## 2018-12-07 DIAGNOSIS — E7849 Other hyperlipidemia: Secondary | ICD-10-CM | POA: Diagnosis not present

## 2018-12-07 DIAGNOSIS — D509 Iron deficiency anemia, unspecified: Secondary | ICD-10-CM | POA: Diagnosis not present

## 2018-12-07 DIAGNOSIS — N186 End stage renal disease: Secondary | ICD-10-CM | POA: Diagnosis not present

## 2018-12-07 DIAGNOSIS — D631 Anemia in chronic kidney disease: Secondary | ICD-10-CM | POA: Diagnosis not present

## 2018-12-07 DIAGNOSIS — N2581 Secondary hyperparathyroidism of renal origin: Secondary | ICD-10-CM | POA: Diagnosis not present

## 2018-12-07 DIAGNOSIS — N2589 Other disorders resulting from impaired renal tubular function: Secondary | ICD-10-CM | POA: Diagnosis not present

## 2018-12-07 DIAGNOSIS — D513 Other dietary vitamin B12 deficiency anemia: Secondary | ICD-10-CM | POA: Diagnosis not present

## 2018-12-07 DIAGNOSIS — Z4932 Encounter for adequacy testing for peritoneal dialysis: Secondary | ICD-10-CM | POA: Diagnosis not present

## 2018-12-07 DIAGNOSIS — E440 Moderate protein-calorie malnutrition: Secondary | ICD-10-CM | POA: Diagnosis not present

## 2018-12-08 DIAGNOSIS — N186 End stage renal disease: Secondary | ICD-10-CM | POA: Diagnosis not present

## 2018-12-08 DIAGNOSIS — D631 Anemia in chronic kidney disease: Secondary | ICD-10-CM | POA: Diagnosis not present

## 2018-12-08 DIAGNOSIS — E440 Moderate protein-calorie malnutrition: Secondary | ICD-10-CM | POA: Diagnosis not present

## 2018-12-08 DIAGNOSIS — D509 Iron deficiency anemia, unspecified: Secondary | ICD-10-CM | POA: Diagnosis not present

## 2018-12-08 DIAGNOSIS — N2581 Secondary hyperparathyroidism of renal origin: Secondary | ICD-10-CM | POA: Diagnosis not present

## 2018-12-08 DIAGNOSIS — E7849 Other hyperlipidemia: Secondary | ICD-10-CM | POA: Diagnosis not present

## 2018-12-08 DIAGNOSIS — E119 Type 2 diabetes mellitus without complications: Secondary | ICD-10-CM | POA: Diagnosis not present

## 2018-12-08 DIAGNOSIS — Z4932 Encounter for adequacy testing for peritoneal dialysis: Secondary | ICD-10-CM | POA: Diagnosis not present

## 2018-12-08 DIAGNOSIS — N2589 Other disorders resulting from impaired renal tubular function: Secondary | ICD-10-CM | POA: Diagnosis not present

## 2018-12-08 DIAGNOSIS — D513 Other dietary vitamin B12 deficiency anemia: Secondary | ICD-10-CM | POA: Diagnosis not present

## 2018-12-09 DIAGNOSIS — E119 Type 2 diabetes mellitus without complications: Secondary | ICD-10-CM | POA: Diagnosis not present

## 2018-12-09 DIAGNOSIS — N186 End stage renal disease: Secondary | ICD-10-CM | POA: Diagnosis not present

## 2018-12-09 DIAGNOSIS — E440 Moderate protein-calorie malnutrition: Secondary | ICD-10-CM | POA: Diagnosis not present

## 2018-12-09 DIAGNOSIS — N2589 Other disorders resulting from impaired renal tubular function: Secondary | ICD-10-CM | POA: Diagnosis not present

## 2018-12-09 DIAGNOSIS — D509 Iron deficiency anemia, unspecified: Secondary | ICD-10-CM | POA: Diagnosis not present

## 2018-12-09 DIAGNOSIS — E7849 Other hyperlipidemia: Secondary | ICD-10-CM | POA: Diagnosis not present

## 2018-12-09 DIAGNOSIS — D631 Anemia in chronic kidney disease: Secondary | ICD-10-CM | POA: Diagnosis not present

## 2018-12-09 DIAGNOSIS — Z4932 Encounter for adequacy testing for peritoneal dialysis: Secondary | ICD-10-CM | POA: Diagnosis not present

## 2018-12-09 DIAGNOSIS — N2581 Secondary hyperparathyroidism of renal origin: Secondary | ICD-10-CM | POA: Diagnosis not present

## 2018-12-09 DIAGNOSIS — D513 Other dietary vitamin B12 deficiency anemia: Secondary | ICD-10-CM | POA: Diagnosis not present

## 2018-12-10 DIAGNOSIS — N2589 Other disorders resulting from impaired renal tubular function: Secondary | ICD-10-CM | POA: Diagnosis not present

## 2018-12-10 DIAGNOSIS — E119 Type 2 diabetes mellitus without complications: Secondary | ICD-10-CM | POA: Diagnosis not present

## 2018-12-10 DIAGNOSIS — D513 Other dietary vitamin B12 deficiency anemia: Secondary | ICD-10-CM | POA: Diagnosis not present

## 2018-12-10 DIAGNOSIS — E7849 Other hyperlipidemia: Secondary | ICD-10-CM | POA: Diagnosis not present

## 2018-12-10 DIAGNOSIS — Z4932 Encounter for adequacy testing for peritoneal dialysis: Secondary | ICD-10-CM | POA: Diagnosis not present

## 2018-12-10 DIAGNOSIS — D509 Iron deficiency anemia, unspecified: Secondary | ICD-10-CM | POA: Diagnosis not present

## 2018-12-10 DIAGNOSIS — N2581 Secondary hyperparathyroidism of renal origin: Secondary | ICD-10-CM | POA: Diagnosis not present

## 2018-12-10 DIAGNOSIS — E440 Moderate protein-calorie malnutrition: Secondary | ICD-10-CM | POA: Diagnosis not present

## 2018-12-10 DIAGNOSIS — D631 Anemia in chronic kidney disease: Secondary | ICD-10-CM | POA: Diagnosis not present

## 2018-12-10 DIAGNOSIS — N186 End stage renal disease: Secondary | ICD-10-CM | POA: Diagnosis not present

## 2018-12-11 DIAGNOSIS — D513 Other dietary vitamin B12 deficiency anemia: Secondary | ICD-10-CM | POA: Diagnosis not present

## 2018-12-11 DIAGNOSIS — E440 Moderate protein-calorie malnutrition: Secondary | ICD-10-CM | POA: Diagnosis not present

## 2018-12-11 DIAGNOSIS — D631 Anemia in chronic kidney disease: Secondary | ICD-10-CM | POA: Diagnosis not present

## 2018-12-11 DIAGNOSIS — E119 Type 2 diabetes mellitus without complications: Secondary | ICD-10-CM | POA: Diagnosis not present

## 2018-12-11 DIAGNOSIS — D509 Iron deficiency anemia, unspecified: Secondary | ICD-10-CM | POA: Diagnosis not present

## 2018-12-11 DIAGNOSIS — Z4932 Encounter for adequacy testing for peritoneal dialysis: Secondary | ICD-10-CM | POA: Diagnosis not present

## 2018-12-11 DIAGNOSIS — N2581 Secondary hyperparathyroidism of renal origin: Secondary | ICD-10-CM | POA: Diagnosis not present

## 2018-12-11 DIAGNOSIS — N2589 Other disorders resulting from impaired renal tubular function: Secondary | ICD-10-CM | POA: Diagnosis not present

## 2018-12-11 DIAGNOSIS — N186 End stage renal disease: Secondary | ICD-10-CM | POA: Diagnosis not present

## 2018-12-11 DIAGNOSIS — E7849 Other hyperlipidemia: Secondary | ICD-10-CM | POA: Diagnosis not present

## 2018-12-12 DIAGNOSIS — E7849 Other hyperlipidemia: Secondary | ICD-10-CM | POA: Diagnosis not present

## 2018-12-12 DIAGNOSIS — D631 Anemia in chronic kidney disease: Secondary | ICD-10-CM | POA: Diagnosis not present

## 2018-12-12 DIAGNOSIS — N2581 Secondary hyperparathyroidism of renal origin: Secondary | ICD-10-CM | POA: Diagnosis not present

## 2018-12-12 DIAGNOSIS — D509 Iron deficiency anemia, unspecified: Secondary | ICD-10-CM | POA: Diagnosis not present

## 2018-12-12 DIAGNOSIS — E119 Type 2 diabetes mellitus without complications: Secondary | ICD-10-CM | POA: Diagnosis not present

## 2018-12-12 DIAGNOSIS — E440 Moderate protein-calorie malnutrition: Secondary | ICD-10-CM | POA: Diagnosis not present

## 2018-12-12 DIAGNOSIS — D513 Other dietary vitamin B12 deficiency anemia: Secondary | ICD-10-CM | POA: Diagnosis not present

## 2018-12-12 DIAGNOSIS — N186 End stage renal disease: Secondary | ICD-10-CM | POA: Diagnosis not present

## 2018-12-12 DIAGNOSIS — N2589 Other disorders resulting from impaired renal tubular function: Secondary | ICD-10-CM | POA: Diagnosis not present

## 2018-12-12 DIAGNOSIS — Z4932 Encounter for adequacy testing for peritoneal dialysis: Secondary | ICD-10-CM | POA: Diagnosis not present

## 2018-12-13 DIAGNOSIS — E440 Moderate protein-calorie malnutrition: Secondary | ICD-10-CM | POA: Diagnosis not present

## 2018-12-13 DIAGNOSIS — D631 Anemia in chronic kidney disease: Secondary | ICD-10-CM | POA: Diagnosis not present

## 2018-12-13 DIAGNOSIS — N186 End stage renal disease: Secondary | ICD-10-CM | POA: Diagnosis not present

## 2018-12-13 DIAGNOSIS — D513 Other dietary vitamin B12 deficiency anemia: Secondary | ICD-10-CM | POA: Diagnosis not present

## 2018-12-13 DIAGNOSIS — N2589 Other disorders resulting from impaired renal tubular function: Secondary | ICD-10-CM | POA: Diagnosis not present

## 2018-12-13 DIAGNOSIS — D509 Iron deficiency anemia, unspecified: Secondary | ICD-10-CM | POA: Diagnosis not present

## 2018-12-13 DIAGNOSIS — E119 Type 2 diabetes mellitus without complications: Secondary | ICD-10-CM | POA: Diagnosis not present

## 2018-12-13 DIAGNOSIS — Z4932 Encounter for adequacy testing for peritoneal dialysis: Secondary | ICD-10-CM | POA: Diagnosis not present

## 2018-12-13 DIAGNOSIS — E7849 Other hyperlipidemia: Secondary | ICD-10-CM | POA: Diagnosis not present

## 2018-12-13 DIAGNOSIS — N2581 Secondary hyperparathyroidism of renal origin: Secondary | ICD-10-CM | POA: Diagnosis not present

## 2018-12-14 DIAGNOSIS — E440 Moderate protein-calorie malnutrition: Secondary | ICD-10-CM | POA: Diagnosis not present

## 2018-12-14 DIAGNOSIS — D513 Other dietary vitamin B12 deficiency anemia: Secondary | ICD-10-CM | POA: Diagnosis not present

## 2018-12-14 DIAGNOSIS — Z4932 Encounter for adequacy testing for peritoneal dialysis: Secondary | ICD-10-CM | POA: Diagnosis not present

## 2018-12-14 DIAGNOSIS — N2589 Other disorders resulting from impaired renal tubular function: Secondary | ICD-10-CM | POA: Diagnosis not present

## 2018-12-14 DIAGNOSIS — D509 Iron deficiency anemia, unspecified: Secondary | ICD-10-CM | POA: Diagnosis not present

## 2018-12-14 DIAGNOSIS — E7849 Other hyperlipidemia: Secondary | ICD-10-CM | POA: Diagnosis not present

## 2018-12-14 DIAGNOSIS — N2581 Secondary hyperparathyroidism of renal origin: Secondary | ICD-10-CM | POA: Diagnosis not present

## 2018-12-14 DIAGNOSIS — N186 End stage renal disease: Secondary | ICD-10-CM | POA: Diagnosis not present

## 2018-12-14 DIAGNOSIS — D631 Anemia in chronic kidney disease: Secondary | ICD-10-CM | POA: Diagnosis not present

## 2018-12-14 DIAGNOSIS — E119 Type 2 diabetes mellitus without complications: Secondary | ICD-10-CM | POA: Diagnosis not present

## 2018-12-15 DIAGNOSIS — D509 Iron deficiency anemia, unspecified: Secondary | ICD-10-CM | POA: Diagnosis not present

## 2018-12-15 DIAGNOSIS — N186 End stage renal disease: Secondary | ICD-10-CM | POA: Diagnosis not present

## 2018-12-15 DIAGNOSIS — N2581 Secondary hyperparathyroidism of renal origin: Secondary | ICD-10-CM | POA: Diagnosis not present

## 2018-12-15 DIAGNOSIS — Z4932 Encounter for adequacy testing for peritoneal dialysis: Secondary | ICD-10-CM | POA: Diagnosis not present

## 2018-12-15 DIAGNOSIS — N2589 Other disorders resulting from impaired renal tubular function: Secondary | ICD-10-CM | POA: Diagnosis not present

## 2018-12-15 DIAGNOSIS — E119 Type 2 diabetes mellitus without complications: Secondary | ICD-10-CM | POA: Diagnosis not present

## 2018-12-15 DIAGNOSIS — E440 Moderate protein-calorie malnutrition: Secondary | ICD-10-CM | POA: Diagnosis not present

## 2018-12-15 DIAGNOSIS — E7849 Other hyperlipidemia: Secondary | ICD-10-CM | POA: Diagnosis not present

## 2018-12-15 DIAGNOSIS — D631 Anemia in chronic kidney disease: Secondary | ICD-10-CM | POA: Diagnosis not present

## 2018-12-15 DIAGNOSIS — D513 Other dietary vitamin B12 deficiency anemia: Secondary | ICD-10-CM | POA: Diagnosis not present

## 2018-12-16 DIAGNOSIS — N2581 Secondary hyperparathyroidism of renal origin: Secondary | ICD-10-CM | POA: Diagnosis not present

## 2018-12-16 DIAGNOSIS — N186 End stage renal disease: Secondary | ICD-10-CM | POA: Diagnosis not present

## 2018-12-16 DIAGNOSIS — N2589 Other disorders resulting from impaired renal tubular function: Secondary | ICD-10-CM | POA: Diagnosis not present

## 2018-12-16 DIAGNOSIS — D631 Anemia in chronic kidney disease: Secondary | ICD-10-CM | POA: Diagnosis not present

## 2018-12-16 DIAGNOSIS — D513 Other dietary vitamin B12 deficiency anemia: Secondary | ICD-10-CM | POA: Diagnosis not present

## 2018-12-16 DIAGNOSIS — E440 Moderate protein-calorie malnutrition: Secondary | ICD-10-CM | POA: Diagnosis not present

## 2018-12-16 DIAGNOSIS — D509 Iron deficiency anemia, unspecified: Secondary | ICD-10-CM | POA: Diagnosis not present

## 2018-12-16 DIAGNOSIS — Z4932 Encounter for adequacy testing for peritoneal dialysis: Secondary | ICD-10-CM | POA: Diagnosis not present

## 2018-12-16 DIAGNOSIS — E7849 Other hyperlipidemia: Secondary | ICD-10-CM | POA: Diagnosis not present

## 2018-12-16 DIAGNOSIS — E119 Type 2 diabetes mellitus without complications: Secondary | ICD-10-CM | POA: Diagnosis not present

## 2018-12-17 DIAGNOSIS — E7849 Other hyperlipidemia: Secondary | ICD-10-CM | POA: Diagnosis not present

## 2018-12-17 DIAGNOSIS — D631 Anemia in chronic kidney disease: Secondary | ICD-10-CM | POA: Diagnosis not present

## 2018-12-17 DIAGNOSIS — N186 End stage renal disease: Secondary | ICD-10-CM | POA: Diagnosis not present

## 2018-12-17 DIAGNOSIS — E119 Type 2 diabetes mellitus without complications: Secondary | ICD-10-CM | POA: Diagnosis not present

## 2018-12-17 DIAGNOSIS — D509 Iron deficiency anemia, unspecified: Secondary | ICD-10-CM | POA: Diagnosis not present

## 2018-12-17 DIAGNOSIS — Z4932 Encounter for adequacy testing for peritoneal dialysis: Secondary | ICD-10-CM | POA: Diagnosis not present

## 2018-12-17 DIAGNOSIS — D513 Other dietary vitamin B12 deficiency anemia: Secondary | ICD-10-CM | POA: Diagnosis not present

## 2018-12-17 DIAGNOSIS — N2589 Other disorders resulting from impaired renal tubular function: Secondary | ICD-10-CM | POA: Diagnosis not present

## 2018-12-17 DIAGNOSIS — E440 Moderate protein-calorie malnutrition: Secondary | ICD-10-CM | POA: Diagnosis not present

## 2018-12-17 DIAGNOSIS — N2581 Secondary hyperparathyroidism of renal origin: Secondary | ICD-10-CM | POA: Diagnosis not present

## 2018-12-18 DIAGNOSIS — Z4932 Encounter for adequacy testing for peritoneal dialysis: Secondary | ICD-10-CM | POA: Diagnosis not present

## 2018-12-18 DIAGNOSIS — E119 Type 2 diabetes mellitus without complications: Secondary | ICD-10-CM | POA: Diagnosis not present

## 2018-12-18 DIAGNOSIS — E7849 Other hyperlipidemia: Secondary | ICD-10-CM | POA: Diagnosis not present

## 2018-12-18 DIAGNOSIS — N186 End stage renal disease: Secondary | ICD-10-CM | POA: Diagnosis not present

## 2018-12-18 DIAGNOSIS — N2589 Other disorders resulting from impaired renal tubular function: Secondary | ICD-10-CM | POA: Diagnosis not present

## 2018-12-18 DIAGNOSIS — D631 Anemia in chronic kidney disease: Secondary | ICD-10-CM | POA: Diagnosis not present

## 2018-12-18 DIAGNOSIS — E440 Moderate protein-calorie malnutrition: Secondary | ICD-10-CM | POA: Diagnosis not present

## 2018-12-18 DIAGNOSIS — N2581 Secondary hyperparathyroidism of renal origin: Secondary | ICD-10-CM | POA: Diagnosis not present

## 2018-12-18 DIAGNOSIS — D509 Iron deficiency anemia, unspecified: Secondary | ICD-10-CM | POA: Diagnosis not present

## 2018-12-18 DIAGNOSIS — D513 Other dietary vitamin B12 deficiency anemia: Secondary | ICD-10-CM | POA: Diagnosis not present

## 2018-12-19 DIAGNOSIS — E7849 Other hyperlipidemia: Secondary | ICD-10-CM | POA: Diagnosis not present

## 2018-12-19 DIAGNOSIS — E119 Type 2 diabetes mellitus without complications: Secondary | ICD-10-CM | POA: Diagnosis not present

## 2018-12-19 DIAGNOSIS — N2589 Other disorders resulting from impaired renal tubular function: Secondary | ICD-10-CM | POA: Diagnosis not present

## 2018-12-19 DIAGNOSIS — D631 Anemia in chronic kidney disease: Secondary | ICD-10-CM | POA: Diagnosis not present

## 2018-12-19 DIAGNOSIS — N2581 Secondary hyperparathyroidism of renal origin: Secondary | ICD-10-CM | POA: Diagnosis not present

## 2018-12-19 DIAGNOSIS — E440 Moderate protein-calorie malnutrition: Secondary | ICD-10-CM | POA: Diagnosis not present

## 2018-12-19 DIAGNOSIS — Z4932 Encounter for adequacy testing for peritoneal dialysis: Secondary | ICD-10-CM | POA: Diagnosis not present

## 2018-12-19 DIAGNOSIS — D513 Other dietary vitamin B12 deficiency anemia: Secondary | ICD-10-CM | POA: Diagnosis not present

## 2018-12-19 DIAGNOSIS — N186 End stage renal disease: Secondary | ICD-10-CM | POA: Diagnosis not present

## 2018-12-19 DIAGNOSIS — D509 Iron deficiency anemia, unspecified: Secondary | ICD-10-CM | POA: Diagnosis not present

## 2018-12-19 NOTE — Progress Notes (Signed)
MEDICARE ANNUAL WELLNESS VISIT AND FOLLOW UP  Assessment:    Left leg pain Decrease pulses, possible claudication, will get ABI rule out PAD Possible spinal stenosis versus neuropathy Negative straight leg No palpable cord, no swelling. -     Zinc -     VAS [place] ABI WITH/WO TBI  B12 deficiency Will check levels with paresthesias.  - B12 is low end of normal, add sublingual B12.  -     Vitamin B12 -     Methylmalonic acid, serum  Paresthesias Check levels, ? B12 def, zinc overdose -     Vitamin B12 -     Methylmalonic acid, serum -     Zinc - had recent increase in her medications, if labs negative follow up with renal doctor - ? Possible carpal tunnel, will refer to neuro for EMG if labs negative  Voiding dysfunction -     US Pelvis Limited; Future - patient states she has not voided x 1 month - no pain in AB, likely due to ESRD however will get bladder scan - any AB pain/back pain go to ER  Encounter for Medicare annual wellness exam 1 year  Aortic atherosclerosis ([place]) Control blood pressure, cholesterol, glucose, increase exercise.   Diabetes mellitus due to underlying condition with diabetic nephropathy, without long-term current use of insulin ([place]) Discussed general issues about diabetes pathophysiology and management., Educational material distributed., Suggested low cholesterol diet., Encouraged aerobic exercise., Discussed foot care., Reminded to get yearly retinal exam.  Secondary hyperparathyroidism (CKD) Need to get DEXA, will try to get with MGM Had calcium PTH and phosphorus checked -     DG Bone Density; Future  ESRD (end stage renal disease) ([place]) Continue follow up  Anemia of chronic Renal Dz Monitor  Hyperlipidemia check lipids decrease fatty foods increase activity.  -     Lipid panel -     Hepatic function panel  Medication management -     CBC with Differential/Platelet -     Magnesium  Essential hypertension - continue medications,  DASH diet, exercise and monitor at home. Call if greater than 130/80.  -     TSH  Idiopathic gout, unspecified chronicity, unspecified site Monitor  Vitamin D deficiency -     VITAMIN D 25 Hydroxy (Vit-D Deficiency, Fractures)  Overweight (BMI 25.0-29.9)  Overweight  - long discussion about weight loss, diet, and exercise -recommended diet heavy in fruits and veggies and low in animal meats, cheeses, and dairy products  Pancreatic cyst Monitor  GERD Continue PPI/H2 blocker, diet discussed  DJD Continue follow up  Osteopenia, unspecified location Need to repeat DEXA  Over 30 minutes of exam, counseling, chart review, and critical decision making was performed Future Appointments  Date Time Provider [place]  01/15/2019  3:45 PM [name], PA-C [facility] None  03/07/2019  8:15 AM Hayden Pedro, MD [facility] None  03/21/2019 11:00 AM Unk Pinto, MD [facility] None  06/10/2019  9:00 AM Liane Comber, NP [facility] None    Plan:   During the course of the visit the patient was educated and counseled about appropriate screening and preventive services including:    Pneumococcal vaccine   Influenza vaccine  Td vaccine  Prevnar 13  Screening electrocardiogram  Screening mammography  Bone densitometry screening  Colorectal cancer screening  Diabetes screening  Glaucoma screening  Nutrition counseling   Advanced directives: given info/requested copies   Subjective:   Eileen Perez is a 78 y.o. female who presents for Medicare  Annual Wellness Visit and 3 month follow up on hypertension, prediabetes, hyperlipidemia, vitamin D def.   Patient has ESRD since 2017 on peritoneal dialysis at home, followed by Dr. Corliss Parish, no signs of fluid overload. She has history of secondary hyperparathyroidism.   She complains of left leg pain x 3 months but worse x 1 month. No injury. Left lateral leg pain, worse with walking, less than 1/2  mile will start to hurt, better with resting. She has leg cramps at night in that leg only. No lower back pain, no surgery or injury there. No swelling, numbness, tingling. Potentially some weakness in that leg with walking. She avoids stairs, thinks she could go up with holding onto something. Had a fall 6 months ago, mechanical, she does not walk with a cane, just had weakness in her legs and went down, no injury.   She states she has not voided x 2 weeks, no AB pain, no fever, chills, no back pain. She is on ESRD.   She also has pins and needles bilateral hands x 3 months, she is right handed, right is worse than left. She states will be intermittent, usually about an hour, never wakes her up, has had AM, PM, can not say that anything effects it. She is on zinc, her senispar was increase from 30 to 60. She is on hydralazine. She has some atrophy thenar area, will refer to neuro for EMG.   Lab Results  Component Value Date   VITAMINB12 708 08/12/2014    She drives and does her own medications.   BMI is Body mass index is 27.8 kg/m., she has been working on diet but not exercise.  Wt Readings from Last 3 Encounters:  12/20/18 152 lb (68.9 kg)  08/30/18 157 lb (71.2 kg)  08/13/18 150 lb 9.6 oz (68.3 kg)   Her blood pressure has been controlled at home, today their BP is BP: 126/70 She does not workout. She denies chest pain, shortness of breath, dizziness.    Had her kidney function, albumin, PTH, potassium calcium, phosphorus, and A1C checked at the dialysis center. Jan 26.  Lab Results  Component Value Date   GFRAA 4 (L) 08/30/2018   She is not on cholesterol medication and denies myalgias. She was prescribed crestor but never took it.   Her cholesterol is not at goal. The cholesterol last visit was:   Lab Results  Component Value Date   CHOL 205 (H) 08/30/2018   HDL 53 08/30/2018   LDLCALC 112 (H) 08/30/2018   TRIG 280 (H) 08/30/2018   CHOLHDL 3.9 08/30/2018   She has been  working on diet and exercise for prediabetes, and denies foot ulcerations, hyperglycemia, hypoglycemia , increased appetite, nausea, paresthesia of the feet, polydipsia, polyuria, visual disturbances, vomiting and weight loss. Last A1C in the office was:  Lab Results  Component Value Date   HGBA1C 6.2 (H) 08/30/2018   Patient is on Vitamin D supplement. Lab Results  Component Value Date   VD25OH 23 (L) 08/30/2018      Patient is on allopurinol for gout and does not report a recent flare.  Lab Results  Component Value Date   LABURIC 2.9 08/30/2018    Medication Review Current Outpatient Medications on File Prior to Visit  Medication Sig Dispense Refill  . allopurinol (ZYLOPRIM) 300 MG tablet TAKE 1/2 TO 1 TABLET BY  MOUTH DAILY AS DIRECTED 90 tablet 3  . aspirin 81 MG tablet Take 81 mg by mouth at  bedtime.     Marland Kitchen b complex-vitamin c-folic acid (NEPHRO-VITE) 0.8 MG TABS tablet Take 1 tablet by mouth at bedtime.    . calcitRIOL (ROCALTROL) 0.5 MCG capsule Take 0.5 mcg by mouth daily.     . calcium acetate (PHOSLO) 667 MG capsule Take 667 mg by mouth 3 (three) times daily with meals.     . Cholecalciferol (VITAMIN D-3 PO) Take 5,000 Units by mouth every other day.     . cinacalcet (SENSIPAR) 30 MG tablet Take 30 mg by mouth 2 (two) times daily.     Marland Kitchen docusate sodium (COLACE) 100 MG capsule Take 100 mg by mouth 2 (two) times daily.    . Flaxseed, Linseed, (FLAXSEED OIL) 1000 MG CAPS Take 1 capsule by mouth daily.     Marland Kitchen glucose blood (ACCU-CHEK AVIVA PLUS) test strip Test once daily 100 each 6  . hydrALAZINE (APRESOLINE) 10 MG tablet Take 1 tablet (10 mg total) by mouth 2 (two) times daily. PT IS OUT OF MEDICATION-PLEASE SEND OVERNIGHT 90 tablet 3  . labetalol (NORMODYNE) 200 MG tablet TAKE 1 TABLET BY MOUTH TWO  TIMES DAILY 180 tablet 1  . Omega-3 Fatty Acids (FISH OIL) 1200 MG CAPS Take 2 capsules by mouth daily.    . sevelamer carbonate (RENVELA) 800 MG tablet Take 1,600 mg by mouth 3  (three) times daily with meals.     . zinc gluconate 50 MG tablet Take 50 mg by mouth every morning.     No current facility-administered medications on file prior to visit.     Current Problems (verified) Patient Active Problem List   Diagnosis Date Noted  . Aortic atherosclerosis ([place]) 02/13/2018  . T2_NIDDM w/ESRD ([place]) 05/19/2016  . Overweight (BMI 25.0-29.9) 10/31/2015  . ESRD (end stage renal disease) ([place]) 10/31/2015  . OA (osteoarthritis) of hip 05/26/2015  . Secondary hyperparathyroidism (CKD) 04/16/2014  . Vitamin D deficiency 04/16/2014  . Hyperlipidemia 04/16/2014  . Medication management 04/16/2014  . Anemia of chronic Renal Dz   . Pancreatic cyst   . GERD   . Gout   . Osteopenia   . Hypertension 06/10/2013  . DJD 06/09/2013    Screening Tests Immunization History  Administered Date(s) Administered  . DT 02/05/2014  . Influenza, High Dose Seasonal PF 09/25/2013  . Influenza-Unspecified 08/11/2015, 07/21/2017  . Pneumococcal Polysaccharide-23 11/20/2009  . Td 11/21/2003  . Zoster 11/20/2005    Preventative care: Last colonoscopy: 2014 Last mammogram: 02/2018- will get DEXA: 2016- needs repeat  Prior vaccinations: TD or Tdap: 2015 Influenza: 2018  Pneumococcal: 2011 Prevnar13: Declines Shingles/Zostavax: 2007  Names of Other Physician/Practitioners you currently use: 1. [facility]- here for primary care 2. Dr. Herbert Deaner, eye doctor, last visit 01/31/2018 - has follow up 3. Does not see one, dentist, last visit 2015  Patient Care Team: Unk Pinto, MD as PCP - General (Internal Medicine) Gaynelle Arabian, MD as Consulting Physician (Orthopedic Surgery) Corliss Parish, MD as Consulting Physician (Nephrology) Monna Fam, MD as Consulting Physician (Ophthalmology) Laurence Spates, MD as Consulting Physician (Gastroenterology) Stanford Breed Denice Bors, MD as Consulting Physician (Cardiology) Kathie Rhodes,  MD as Consulting Physician (Urology)  Allergies Allergies  Allergen Reactions  . Ace Inhibitors     unknown  . Nsaids     unknown    SURGICAL HISTORY She  has a past surgical history that includes Pancreatic cyst excision (1999); Joint replacement (2012); Eye surgery (Left); Cholecystectomy; Total knee arthroplasty (Right, 06/09/2013); Colonoscopy (03/21/12); left knee replacement ;  Total hip arthroplasty (Right, 05/26/2015); and Insertion of dialysis catheter (N/A, 02/15/2017). FAMILY HISTORY Her family history includes Cancer in her father and mother; Diabetes in her mother; Hypertension in her mother. SOCIAL HISTORY She  reports that she has never smoked. She has never used smokeless tobacco. She reports that she does not drink alcohol or use drugs.  MEDICARE WELLNESS OBJECTIVES: Physical activity:   Cardiac risk factors:   Depression/mood screen:   Depression screen [facility] 2/9 09/01/2018  Decreased Interest 0  Down, Depressed, Hopeless 0  PHQ - 2 Score 0    ADLs:  In your present state of health, do you have any difficulty performing the following activities: 09/01/2018 05/30/2018  Hearing? N N  Vision? N N  Difficulty concentrating or making decisions? N N  Walking or climbing stairs? N N  Dressing or bathing? N N  Doing errands, shopping? N N  Some recent data might be hidden     Cognitive Testing  Alert? Yes  Normal Appearance?Yes  Oriented to person? Yes  Place? Yes   Time? Yes  Recall of three objects?  Yes  Can perform simple calculations? Yes  Displays appropriate judgment?Yes  Can read the correct time from a watch face?Yes  EOL planning:     Objective:   Today's Vitals   12/20/18 0840  BP: 126/70  Pulse: 71  Temp: 97.6 F (36.4 C)  SpO2: 99%  Weight: 152 lb (68.9 kg)  Height: 5\' 2"  (1.575 m)  PainSc: 7   PainLoc: Leg   Body mass index is 27.8 kg/m.  General appearance: alert, no distress, WD/WN,  female HEENT: normocephalic, sclerae anicteric, TMs  pearly, nares patent, no discharge or erythema, pharynx normal Oral cavity: MMM, no lesions Neck: supple, no lymphadenopathy, no thyromegaly, no masses Heart: RRR, normal S1, S2, no murmurs Lungs: CTA bilaterally, no wheezes, rhonchi, or rales Abdomen: +bs, soft, non tender, non distended, no masses, no hepatomegaly, no splenomegaly, access site to LLQ without discharge or injection.  Musculoskeletal: nontender, no swelling, no obvious deformity, negative straight leg raise, no hard cords.  Extremities: no edema, no cyanosis, no clubbing Pulses: 2+ symmetric upper and decrease bilatera lower extremities, normal cap refill Neurological: alert, oriented x 3, CN2-12 intact, strength normal upper extremities and lower extremities, sensation decreased bilateral feet throughout, DTRs 2+ throughout, no cerebellar signs, gait normal Psychiatric: normal affect, behavior normal, pleasant  Breast: defer Gyn: defer Rectal: defer   Medicare Attestation I have personally reviewed: The patient's medical and social history Their use of alcohol, tobacco or illicit drugs Their current medications and supplements The patient's functional ability including ADLs,fall risks, home safety risks, cognitive, and hearing and visual impairment Diet and physical activities Evidence for depression or mood disorders  The patient's weight, height, BMI, and visual acuity have been recorded in the chart.  I have made referrals, counseling, and provided education to the patient based on review of the above and I have provided the patient with a written personalized care plan for preventive services.     [name], PA-C   12/20/2018

## 2018-12-20 ENCOUNTER — Ambulatory Visit (INDEPENDENT_AMBULATORY_CARE_PROVIDER_SITE_OTHER): Payer: Medicare Other | Admitting: Physician Assistant

## 2018-12-20 ENCOUNTER — Encounter: Payer: Self-pay | Admitting: Physician Assistant

## 2018-12-20 VITALS — BP 126/70 | HR 71 | Temp 97.6°F | Ht 62.0 in | Wt 152.0 lb

## 2018-12-20 DIAGNOSIS — Z79899 Other long term (current) drug therapy: Secondary | ICD-10-CM

## 2018-12-20 DIAGNOSIS — D509 Iron deficiency anemia, unspecified: Secondary | ICD-10-CM | POA: Diagnosis not present

## 2018-12-20 DIAGNOSIS — N2589 Other disorders resulting from impaired renal tubular function: Secondary | ICD-10-CM | POA: Diagnosis not present

## 2018-12-20 DIAGNOSIS — K2100 Gastro-esophageal reflux disease with esophagitis, without bleeding: Secondary | ICD-10-CM

## 2018-12-20 DIAGNOSIS — M79602 Pain in left arm: Secondary | ICD-10-CM

## 2018-12-20 DIAGNOSIS — E538 Deficiency of other specified B group vitamins: Secondary | ICD-10-CM | POA: Diagnosis not present

## 2018-12-20 DIAGNOSIS — M159 Polyosteoarthritis, unspecified: Secondary | ICD-10-CM

## 2018-12-20 DIAGNOSIS — E559 Vitamin D deficiency, unspecified: Secondary | ICD-10-CM

## 2018-12-20 DIAGNOSIS — D638 Anemia in other chronic diseases classified elsewhere: Secondary | ICD-10-CM | POA: Diagnosis not present

## 2018-12-20 DIAGNOSIS — M79601 Pain in right arm: Secondary | ICD-10-CM

## 2018-12-20 DIAGNOSIS — N186 End stage renal disease: Secondary | ICD-10-CM | POA: Diagnosis not present

## 2018-12-20 DIAGNOSIS — R202 Paresthesia of skin: Secondary | ICD-10-CM | POA: Diagnosis not present

## 2018-12-20 DIAGNOSIS — Z0001 Encounter for general adult medical examination with abnormal findings: Secondary | ICD-10-CM

## 2018-12-20 DIAGNOSIS — R6889 Other general symptoms and signs: Secondary | ICD-10-CM

## 2018-12-20 DIAGNOSIS — K862 Cyst of pancreas: Secondary | ICD-10-CM

## 2018-12-20 DIAGNOSIS — E0821 Diabetes mellitus due to underlying condition with diabetic nephropathy: Secondary | ICD-10-CM | POA: Diagnosis not present

## 2018-12-20 DIAGNOSIS — I700 Atherosclerosis of aorta: Secondary | ICD-10-CM

## 2018-12-20 DIAGNOSIS — E119 Type 2 diabetes mellitus without complications: Secondary | ICD-10-CM | POA: Diagnosis not present

## 2018-12-20 DIAGNOSIS — D513 Other dietary vitamin B12 deficiency anemia: Secondary | ICD-10-CM | POA: Diagnosis not present

## 2018-12-20 DIAGNOSIS — Z992 Dependence on renal dialysis: Secondary | ICD-10-CM | POA: Diagnosis not present

## 2018-12-20 DIAGNOSIS — I10 Essential (primary) hypertension: Secondary | ICD-10-CM | POA: Diagnosis not present

## 2018-12-20 DIAGNOSIS — N2581 Secondary hyperparathyroidism of renal origin: Secondary | ICD-10-CM | POA: Diagnosis not present

## 2018-12-20 DIAGNOSIS — M8580 Other specified disorders of bone density and structure, unspecified site: Secondary | ICD-10-CM

## 2018-12-20 DIAGNOSIS — E782 Mixed hyperlipidemia: Secondary | ICD-10-CM | POA: Diagnosis not present

## 2018-12-20 DIAGNOSIS — M1000 Idiopathic gout, unspecified site: Secondary | ICD-10-CM

## 2018-12-20 DIAGNOSIS — M79605 Pain in left leg: Secondary | ICD-10-CM

## 2018-12-20 DIAGNOSIS — Z0000 Encounter for general adult medical examination without abnormal findings: Secondary | ICD-10-CM

## 2018-12-20 DIAGNOSIS — Z4932 Encounter for adequacy testing for peritoneal dialysis: Secondary | ICD-10-CM | POA: Diagnosis not present

## 2018-12-20 DIAGNOSIS — E663 Overweight: Secondary | ICD-10-CM

## 2018-12-20 DIAGNOSIS — E1122 Type 2 diabetes mellitus with diabetic chronic kidney disease: Secondary | ICD-10-CM | POA: Diagnosis not present

## 2018-12-20 DIAGNOSIS — E7849 Other hyperlipidemia: Secondary | ICD-10-CM | POA: Diagnosis not present

## 2018-12-20 DIAGNOSIS — D631 Anemia in chronic kidney disease: Secondary | ICD-10-CM | POA: Diagnosis not present

## 2018-12-20 DIAGNOSIS — E440 Moderate protein-calorie malnutrition: Secondary | ICD-10-CM | POA: Diagnosis not present

## 2018-12-20 DIAGNOSIS — N398 Other specified disorders of urinary system: Secondary | ICD-10-CM

## 2018-12-20 NOTE — Patient Instructions (Addendum)
Willing to try crestor 5mg  3-4 days a week See how you do on this medication Your LDL is not in range or at goal, goal is less than 70.  Your LDL is the bad cholesterol that can lead to heart attack and stroke. To lower your number you can decrease your fatty foods, red meat, cheese, milk and increase fiber like whole grains and veggies. You can also add a fiber supplement like Citracel or Benefiber, these do not cause gas and bloating and are safe to use. Since you have risk factors that make [place] want your number below 70, we need to adjust or add medications to get your number below goal.    YOU CAN CALL TO MAKE AN ULTRASOUND..  I have put in an order for an ultrasound for you to have You can set them up at your convenience by calling this number [phone] You will likely have the ultrasound at [facility]  If you have any issues call our office and we will set this up for you.     Going to get an ABI on you to check your legs Here is more info below If this is normal it is likely from your lower back   Spinal Stenosis  Spinal stenosis occurs when the open space (spinal canal) between the bones of your spine (vertebrae) narrows, putting pressure on the spinal cord or nerves. What are the causes? This condition is caused by areas of bone pushing into the central canals of your vertebrae. This condition may be present at birth (congenital), or it may be caused by:  Arthritic deterioration of your vertebrae (spinal degeneration). This usually starts around age 64.  Injury or trauma to the spine.  Tumors in the spine.  Calcium deposits in the spine. What are the signs or symptoms? Symptoms of this condition include:  Pain in the neck or back that is generally worse with activities, particularly when standing and walking.  Numbness, tingling, hot or cold sensations, weakness, or weariness in your legs.  Pain going up and down the leg (sciatica).  Frequent  episodes of falling.  A foot-slapping gait that leads to muscle weakness. In more serious cases, you may develop:  Problemspassing stool or passing urine.  Difficulty having sex.  Loss of feeling in part or all of your leg. Symptoms may come on slowly and get worse over time. How is this diagnosed? This condition is diagnosed based on your medical history and a physical exam. Tests will also be done, such as:  MRI.  CT scan.  X-ray. How is this treated? Treatment for this condition often focuses on managing your pain and any other symptoms. Treatment may include:  Practicing good posture to lessen pressure on your nerves.  Exercising to strengthen muscles, build endurance, improve balance, and maintain good joint movement (range of motion).  Losing weight, if needed.  Taking medicines to reduce swelling, inflammation, or pain.  Assistive devices, such as a corset or brace. In some cases, surgery may be needed. The most common procedure is decompression laminectomy. This is done to remove excess bone that puts pressure on your nerve roots. Follow these instructions at home: Managing pain, stiffness, and swelling  Do all exercises and stretches as told by your health care provider.  Practice good posture. If you were given a brace or a corset, wear it as told by your health care provider.  Do not do any activities that cause pain.  Ask your health care provider what activities are safe for you.  Do not lift anything that is heavier than 10 lb (4.5 kg) or the limit that your health care provider tells you.  Maintain a healthy weight. Talk with your health care provider if you need help losing weight.  If directed, apply heat to the affected area as often as told by your health care provider. Use the heat source that your health care provider recommends, such as a moist heat pack or a heating pad. ? Place a towel between your skin and the heat source. ? Leave the heat on  for 20-30 minutes. ? Remove the heat if your skin turns bright red. This is especially important if you are not able to feel pain, heat, or cold. You may have a greater risk of getting burned. General instructions  Take over-the-counter and prescription medicines only as told by your health care provider.  Do not use any products that contain nicotine or tobacco, such as cigarettes and e-cigarettes. If you need help quitting, ask your health care provider.  Eat a healthy diet. This includes plenty of fruits and vegetables, whole grains, and low-fat (lean) protein.  Keep all follow-up visits as told by your health care provider. This is important. Contact a health care provider if:  Your symptoms do not get better or they get worse.  You have a fever. Get help right away if:  You have new or worse pain in your neck or upper back.  You have severe pain that cannot be controlled with medicines.  You are dizzy.  You have vision problems, blurred vision, or double vision.  You have a severe headache that is worse when you stand.  You have nausea or you vomit.  You develop new or worse numbness or tingling in your back or legs.  You have pain, redness, swelling, or warmth in your arm or leg. Summary  Spinal stenosis occurs when the open space (spinal canal) between the bones of your spine (vertebrae) narrows. This narrowing puts pressure on the spinal cord or nerves.  Spinal stenosis can cause numbness, weakness, or pain in the neck, back, and legs.  This condition may be caused by a birth defect, arthritic deterioration of your vertebrae, injury, tumors, or calcium deposits.  This condition is usually diagnosed with MRIs, CT scans, and X-rays. This information is not intended to replace advice given to you by your health care provider. Make sure you discuss any questions you have with your health care provider. Document Released: 01/27/2004 Document Revised: 10/11/2016 Document  Reviewed: 10/11/2016 Elsevier Interactive Patient Education  2019 [place] Index Test Why am I having this test? The ankle-brachial index (ABI) test is used to diagnose peripheral vascular disease (PVD). PVD is also known as peripheral arterial disease (PAD). PVD is the blocking or hardening of the arteries anywhere within the circulatory system beyond the heart. PVD is caused by:  Cholesterol deposits in your blood vessels (atherosclerosis). This is the most common cause of this condition.  Irritation and swelling (inflammation) in the blood vessels.  Blood clots in the vessels. Cholesterol deposits cause arteries to narrow. Normal delivery of oxygen to your tissues is affected, causing muscle pain and fatigue. This is called claudication. PVD means that there may also be a buildup of cholesterol:  In your heart. This increases the risk of heart attacks.  In your brain. This increases the risk of strokes. What is being tested? The ankle-brachial index  test measures the blood flow in your arms and legs. The blood flow will show if blood vessels in your legs have been narrowed by cholesterol deposits. How do I prepare for this test?  Wear loose clothing.  Do not use any tobacco products, including cigarettes, chewing tobacco, or e-cigarettes, for at least 30 minutes before the test. What happens during the test?  1. You will lie down in a resting position. 2. Your health care provider will use a blood pressure machine and a small ultrasound device (Doppler) to measure the systolic pressures on your upper arms and ankles. Systolic pressure is the pressure inside your arteries when your heart pumps. 3. Systolic pressure measurements will be taken several times, and at several points, on both the ankle and the arm. 4. Your health care provider will divide the highest systolic pressure of the ankle by the highest systolic pressure of the arm. The result is the  ankle-brachial pressure ratio, or ABI. Sometimes this test will be repeated after you have exercised on a treadmill for 5 minutes. You may have leg pain during the exercise portion of the test if you suffer from PVD. If the index number drops after exercise, this may show that PVD is present. How are the results reported? Your test results will be reported as a value that shows the ratio of your ankle pressure to your arm pressure (ABI ratio). Your health care provider will compare your results to normal ranges that were established after testing a large group of people (reference ranges). Reference ranges may vary among labs and hospitals. For this test, a common reference range is:  ABI ratio of 0.9 to 1.3. What do the results mean? An ABI ratio that is below the reference range is considered abnormal and may indicate PVD in the legs. Talk with your health care provider about what your results mean. Questions to ask your health care provider Ask your health care provider, or the department that is doing the test:  When will my results be ready?  How will I get my results?  What are my treatment options?  What other tests do I need?  What are my next steps? Summary  The ankle-brachial index (ABI) test is used to diagnose peripheral vascular disease (PVD). PVD is also known as peripheral arterial disease (PAD).  The ankle-brachial index test measures the blood flow in your arms and legs.  The highest systolic pressure of the ankle is divided by the highest systolic pressure of the arm. The result is the ABI ratio.  An ABI ratio that is below 0.9 is considered abnormal and may indicate PVD in the legs. This information is not intended to replace advice given to you by your health care provider. Make sure you discuss any questions you have with your health care provider. Document Released: 11/10/2004 Document Revised: 07/31/2017 Document Reviewed: 07/31/2017 Elsevier Interactive Patient  Education  [employer].

## 2018-12-21 DIAGNOSIS — D631 Anemia in chronic kidney disease: Secondary | ICD-10-CM | POA: Diagnosis not present

## 2018-12-21 DIAGNOSIS — R82998 Other abnormal findings in urine: Secondary | ICD-10-CM | POA: Diagnosis not present

## 2018-12-21 DIAGNOSIS — N186 End stage renal disease: Secondary | ICD-10-CM | POA: Diagnosis not present

## 2018-12-21 DIAGNOSIS — N2581 Secondary hyperparathyroidism of renal origin: Secondary | ICD-10-CM | POA: Diagnosis not present

## 2018-12-21 DIAGNOSIS — Z79899 Other long term (current) drug therapy: Secondary | ICD-10-CM | POA: Diagnosis not present

## 2018-12-21 DIAGNOSIS — Z23 Encounter for immunization: Secondary | ICD-10-CM | POA: Diagnosis not present

## 2018-12-21 DIAGNOSIS — D509 Iron deficiency anemia, unspecified: Secondary | ICD-10-CM | POA: Diagnosis not present

## 2018-12-21 DIAGNOSIS — R17 Unspecified jaundice: Secondary | ICD-10-CM | POA: Diagnosis not present

## 2018-12-21 DIAGNOSIS — E440 Moderate protein-calorie malnutrition: Secondary | ICD-10-CM | POA: Diagnosis not present

## 2018-12-22 DIAGNOSIS — Z79899 Other long term (current) drug therapy: Secondary | ICD-10-CM | POA: Diagnosis not present

## 2018-12-22 DIAGNOSIS — N186 End stage renal disease: Secondary | ICD-10-CM | POA: Diagnosis not present

## 2018-12-22 DIAGNOSIS — D631 Anemia in chronic kidney disease: Secondary | ICD-10-CM | POA: Diagnosis not present

## 2018-12-22 DIAGNOSIS — N2581 Secondary hyperparathyroidism of renal origin: Secondary | ICD-10-CM | POA: Diagnosis not present

## 2018-12-22 DIAGNOSIS — R17 Unspecified jaundice: Secondary | ICD-10-CM | POA: Diagnosis not present

## 2018-12-22 DIAGNOSIS — D509 Iron deficiency anemia, unspecified: Secondary | ICD-10-CM | POA: Diagnosis not present

## 2018-12-22 DIAGNOSIS — E440 Moderate protein-calorie malnutrition: Secondary | ICD-10-CM | POA: Diagnosis not present

## 2018-12-22 DIAGNOSIS — Z23 Encounter for immunization: Secondary | ICD-10-CM | POA: Diagnosis not present

## 2018-12-22 DIAGNOSIS — R82998 Other abnormal findings in urine: Secondary | ICD-10-CM | POA: Diagnosis not present

## 2018-12-23 DIAGNOSIS — R17 Unspecified jaundice: Secondary | ICD-10-CM | POA: Diagnosis not present

## 2018-12-23 DIAGNOSIS — N2581 Secondary hyperparathyroidism of renal origin: Secondary | ICD-10-CM | POA: Diagnosis not present

## 2018-12-23 DIAGNOSIS — R82998 Other abnormal findings in urine: Secondary | ICD-10-CM | POA: Diagnosis not present

## 2018-12-23 DIAGNOSIS — Z23 Encounter for immunization: Secondary | ICD-10-CM | POA: Diagnosis not present

## 2018-12-23 DIAGNOSIS — E440 Moderate protein-calorie malnutrition: Secondary | ICD-10-CM | POA: Diagnosis not present

## 2018-12-23 DIAGNOSIS — N186 End stage renal disease: Secondary | ICD-10-CM | POA: Diagnosis not present

## 2018-12-23 DIAGNOSIS — D509 Iron deficiency anemia, unspecified: Secondary | ICD-10-CM | POA: Diagnosis not present

## 2018-12-23 DIAGNOSIS — Z79899 Other long term (current) drug therapy: Secondary | ICD-10-CM | POA: Diagnosis not present

## 2018-12-23 DIAGNOSIS — D631 Anemia in chronic kidney disease: Secondary | ICD-10-CM | POA: Diagnosis not present

## 2018-12-24 DIAGNOSIS — R82998 Other abnormal findings in urine: Secondary | ICD-10-CM | POA: Diagnosis not present

## 2018-12-24 DIAGNOSIS — N2581 Secondary hyperparathyroidism of renal origin: Secondary | ICD-10-CM | POA: Diagnosis not present

## 2018-12-24 DIAGNOSIS — R17 Unspecified jaundice: Secondary | ICD-10-CM | POA: Diagnosis not present

## 2018-12-24 DIAGNOSIS — Z79899 Other long term (current) drug therapy: Secondary | ICD-10-CM | POA: Diagnosis not present

## 2018-12-24 DIAGNOSIS — Z23 Encounter for immunization: Secondary | ICD-10-CM | POA: Diagnosis not present

## 2018-12-24 DIAGNOSIS — E440 Moderate protein-calorie malnutrition: Secondary | ICD-10-CM | POA: Diagnosis not present

## 2018-12-24 DIAGNOSIS — N186 End stage renal disease: Secondary | ICD-10-CM | POA: Diagnosis not present

## 2018-12-24 DIAGNOSIS — D509 Iron deficiency anemia, unspecified: Secondary | ICD-10-CM | POA: Diagnosis not present

## 2018-12-24 DIAGNOSIS — D631 Anemia in chronic kidney disease: Secondary | ICD-10-CM | POA: Diagnosis not present

## 2018-12-24 LAB — CBC WITH DIFFERENTIAL/PLATELET
ABSOLUTE MONOCYTES: 485 {cells}/uL (ref 200–950)
BASOS ABS: 29 {cells}/uL (ref 0–200)
BASOS PCT: 0.3 %
EOS ABS: 204 {cells}/uL (ref 15–500)
EOS PCT: 2.1 %
HEMATOCRIT: 33.2 % — AB (ref 35.0–45.0)
HEMOGLOBIN: 11.2 g/dL — AB (ref 11.7–15.5)
LYMPHS ABS: 1930 {cells}/uL (ref 850–3900)
MCH: 31.5 pg (ref 27.0–33.0)
MCHC: 33.7 g/dL (ref 32.0–36.0)
MCV: 93.5 fL (ref 80.0–100.0)
MPV: 10.3 fL (ref 7.5–12.5)
Monocytes Relative: 5 %
Neutro Abs: 7052 cells/uL (ref 1500–7800)
Neutrophils Relative %: 72.7 %
Platelets: 254 10*3/uL (ref 140–400)
RBC: 3.55 10*6/uL — AB (ref 3.80–5.10)
RDW: 14.4 % (ref 11.0–15.0)
Total Lymphocyte: 19.9 %
WBC: 9.7 10*3/uL (ref 3.8–10.8)

## 2018-12-24 LAB — VITAMIN D 25 HYDROXY (VIT D DEFICIENCY, FRACTURES): VIT D 25 HYDROXY: 24 ng/mL — AB (ref 30–100)

## 2018-12-24 LAB — ZINC: Zinc: 118 ug/dL (ref 60–130)

## 2018-12-24 LAB — HEPATIC FUNCTION PANEL
AG Ratio: 1.4 (calc) (ref 1.0–2.5)
ALT: 18 U/L (ref 6–29)
AST: 19 U/L (ref 10–35)
Albumin: 3.8 g/dL (ref 3.6–5.1)
Alkaline phosphatase (APISO): 191 U/L — ABNORMAL HIGH (ref 33–130)
Bilirubin, Direct: 0 mg/dL (ref 0.0–0.2)
Globulin: 2.8 g/dL (calc) (ref 1.9–3.7)
Indirect Bilirubin: 0.4 mg/dL (calc) (ref 0.2–1.2)
Total Bilirubin: 0.4 mg/dL (ref 0.2–1.2)
Total Protein: 6.6 g/dL (ref 6.1–8.1)

## 2018-12-24 LAB — LIPID PANEL
Cholesterol: 213 mg/dL — ABNORMAL HIGH (ref <200)
HDL: 50 mg/dL — AB (ref >50)
LDL Cholesterol (Calc): 120 mg/dL (calc) — ABNORMAL HIGH
Non-HDL Cholesterol (Calc): 163 mg/dL (calc) — ABNORMAL HIGH (ref <130)
TRIGLYCERIDES: 281 mg/dL — AB (ref <150)
Total CHOL/HDL Ratio: 4.3 (calc) (ref <5.0)

## 2018-12-24 LAB — TSH: TSH: 2.51 mIU/L (ref 0.40–4.50)

## 2018-12-24 LAB — VITAMIN B12: Vitamin B-12: 1197 pg/mL — ABNORMAL HIGH (ref 200–1100)

## 2018-12-24 LAB — MAGNESIUM: Magnesium: 1.7 mg/dL (ref 1.5–2.5)

## 2018-12-24 LAB — METHYLMALONIC ACID, SERUM: Methylmalonic Acid, Quant: 649 nmol/L — ABNORMAL HIGH (ref 87–318)

## 2018-12-25 DIAGNOSIS — N2581 Secondary hyperparathyroidism of renal origin: Secondary | ICD-10-CM | POA: Diagnosis not present

## 2018-12-25 DIAGNOSIS — R17 Unspecified jaundice: Secondary | ICD-10-CM | POA: Diagnosis not present

## 2018-12-25 DIAGNOSIS — N186 End stage renal disease: Secondary | ICD-10-CM | POA: Diagnosis not present

## 2018-12-25 DIAGNOSIS — D509 Iron deficiency anemia, unspecified: Secondary | ICD-10-CM | POA: Diagnosis not present

## 2018-12-25 DIAGNOSIS — E440 Moderate protein-calorie malnutrition: Secondary | ICD-10-CM | POA: Diagnosis not present

## 2018-12-25 DIAGNOSIS — Z79899 Other long term (current) drug therapy: Secondary | ICD-10-CM | POA: Diagnosis not present

## 2018-12-25 DIAGNOSIS — D631 Anemia in chronic kidney disease: Secondary | ICD-10-CM | POA: Diagnosis not present

## 2018-12-25 DIAGNOSIS — R82998 Other abnormal findings in urine: Secondary | ICD-10-CM | POA: Diagnosis not present

## 2018-12-25 DIAGNOSIS — Z23 Encounter for immunization: Secondary | ICD-10-CM | POA: Diagnosis not present

## 2018-12-25 NOTE — Addendum Note (Signed)
Addended by: [name] R on: 12/25/2018 04:26 PM   Modules accepted: Orders

## 2018-12-26 ENCOUNTER — Other Ambulatory Visit: Payer: Self-pay | Admitting: Physician Assistant

## 2018-12-26 DIAGNOSIS — D631 Anemia in chronic kidney disease: Secondary | ICD-10-CM | POA: Diagnosis not present

## 2018-12-26 DIAGNOSIS — I739 Peripheral vascular disease, unspecified: Secondary | ICD-10-CM

## 2018-12-26 DIAGNOSIS — N2581 Secondary hyperparathyroidism of renal origin: Secondary | ICD-10-CM | POA: Diagnosis not present

## 2018-12-26 DIAGNOSIS — Z23 Encounter for immunization: Secondary | ICD-10-CM | POA: Diagnosis not present

## 2018-12-26 DIAGNOSIS — N186 End stage renal disease: Secondary | ICD-10-CM | POA: Diagnosis not present

## 2018-12-26 DIAGNOSIS — M79605 Pain in left leg: Secondary | ICD-10-CM

## 2018-12-26 DIAGNOSIS — E440 Moderate protein-calorie malnutrition: Secondary | ICD-10-CM | POA: Diagnosis not present

## 2018-12-26 DIAGNOSIS — Z79899 Other long term (current) drug therapy: Secondary | ICD-10-CM | POA: Diagnosis not present

## 2018-12-26 DIAGNOSIS — D509 Iron deficiency anemia, unspecified: Secondary | ICD-10-CM | POA: Diagnosis not present

## 2018-12-26 DIAGNOSIS — R82998 Other abnormal findings in urine: Secondary | ICD-10-CM | POA: Diagnosis not present

## 2018-12-26 DIAGNOSIS — R17 Unspecified jaundice: Secondary | ICD-10-CM | POA: Diagnosis not present

## 2018-12-26 NOTE — Addendum Note (Signed)
Addended by: [name] R on: 12/26/2018 10:44 AM   Modules accepted: Orders

## 2018-12-27 ENCOUNTER — Ambulatory Visit (HOSPITAL_COMMUNITY)
Admission: RE | Admit: 2018-12-27 | Discharge: 2018-12-27 | Disposition: A | Discharge status: Home / Self Care | Payer: Medicare Other | Source: Ambulatory Visit | Attending: Cardiovascular Disease | Admitting: Cardiovascular Disease

## 2018-12-27 ENCOUNTER — Other Ambulatory Visit: Payer: Self-pay | Admitting: Physician Assistant

## 2018-12-27 DIAGNOSIS — Z23 Encounter for immunization: Secondary | ICD-10-CM | POA: Diagnosis not present

## 2018-12-27 DIAGNOSIS — E440 Moderate protein-calorie malnutrition: Secondary | ICD-10-CM | POA: Diagnosis not present

## 2018-12-27 DIAGNOSIS — N2581 Secondary hyperparathyroidism of renal origin: Secondary | ICD-10-CM | POA: Diagnosis not present

## 2018-12-27 DIAGNOSIS — M79605 Pain in left leg: Secondary | ICD-10-CM | POA: Diagnosis not present

## 2018-12-27 DIAGNOSIS — D631 Anemia in chronic kidney disease: Secondary | ICD-10-CM | POA: Diagnosis not present

## 2018-12-27 DIAGNOSIS — I739 Peripheral vascular disease, unspecified: Secondary | ICD-10-CM | POA: Diagnosis not present

## 2018-12-27 DIAGNOSIS — N186 End stage renal disease: Secondary | ICD-10-CM | POA: Diagnosis not present

## 2018-12-27 DIAGNOSIS — R17 Unspecified jaundice: Secondary | ICD-10-CM | POA: Diagnosis not present

## 2018-12-27 DIAGNOSIS — D509 Iron deficiency anemia, unspecified: Secondary | ICD-10-CM | POA: Diagnosis not present

## 2018-12-27 DIAGNOSIS — Z79899 Other long term (current) drug therapy: Secondary | ICD-10-CM | POA: Diagnosis not present

## 2018-12-27 DIAGNOSIS — N398 Other specified disorders of urinary system: Secondary | ICD-10-CM

## 2018-12-27 DIAGNOSIS — R82998 Other abnormal findings in urine: Secondary | ICD-10-CM | POA: Diagnosis not present

## 2018-12-28 DIAGNOSIS — Z79899 Other long term (current) drug therapy: Secondary | ICD-10-CM | POA: Diagnosis not present

## 2018-12-28 DIAGNOSIS — E440 Moderate protein-calorie malnutrition: Secondary | ICD-10-CM | POA: Diagnosis not present

## 2018-12-28 DIAGNOSIS — Z23 Encounter for immunization: Secondary | ICD-10-CM | POA: Diagnosis not present

## 2018-12-28 DIAGNOSIS — R17 Unspecified jaundice: Secondary | ICD-10-CM | POA: Diagnosis not present

## 2018-12-28 DIAGNOSIS — N186 End stage renal disease: Secondary | ICD-10-CM | POA: Diagnosis not present

## 2018-12-28 DIAGNOSIS — D509 Iron deficiency anemia, unspecified: Secondary | ICD-10-CM | POA: Diagnosis not present

## 2018-12-28 DIAGNOSIS — D631 Anemia in chronic kidney disease: Secondary | ICD-10-CM | POA: Diagnosis not present

## 2018-12-28 DIAGNOSIS — N2581 Secondary hyperparathyroidism of renal origin: Secondary | ICD-10-CM | POA: Diagnosis not present

## 2018-12-28 DIAGNOSIS — R82998 Other abnormal findings in urine: Secondary | ICD-10-CM | POA: Diagnosis not present

## 2018-12-29 DIAGNOSIS — D631 Anemia in chronic kidney disease: Secondary | ICD-10-CM | POA: Diagnosis not present

## 2018-12-29 DIAGNOSIS — E440 Moderate protein-calorie malnutrition: Secondary | ICD-10-CM | POA: Diagnosis not present

## 2018-12-29 DIAGNOSIS — Z23 Encounter for immunization: Secondary | ICD-10-CM | POA: Diagnosis not present

## 2018-12-29 DIAGNOSIS — Z79899 Other long term (current) drug therapy: Secondary | ICD-10-CM | POA: Diagnosis not present

## 2018-12-29 DIAGNOSIS — N186 End stage renal disease: Secondary | ICD-10-CM | POA: Diagnosis not present

## 2018-12-29 DIAGNOSIS — N2581 Secondary hyperparathyroidism of renal origin: Secondary | ICD-10-CM | POA: Diagnosis not present

## 2018-12-29 DIAGNOSIS — D509 Iron deficiency anemia, unspecified: Secondary | ICD-10-CM | POA: Diagnosis not present

## 2018-12-29 DIAGNOSIS — R82998 Other abnormal findings in urine: Secondary | ICD-10-CM | POA: Diagnosis not present

## 2018-12-29 DIAGNOSIS — R17 Unspecified jaundice: Secondary | ICD-10-CM | POA: Diagnosis not present

## 2018-12-30 DIAGNOSIS — N186 End stage renal disease: Secondary | ICD-10-CM | POA: Diagnosis not present

## 2018-12-30 DIAGNOSIS — Z23 Encounter for immunization: Secondary | ICD-10-CM | POA: Diagnosis not present

## 2018-12-30 DIAGNOSIS — N2581 Secondary hyperparathyroidism of renal origin: Secondary | ICD-10-CM | POA: Diagnosis not present

## 2018-12-30 DIAGNOSIS — R17 Unspecified jaundice: Secondary | ICD-10-CM | POA: Diagnosis not present

## 2018-12-30 DIAGNOSIS — Z79899 Other long term (current) drug therapy: Secondary | ICD-10-CM | POA: Diagnosis not present

## 2018-12-30 DIAGNOSIS — E440 Moderate protein-calorie malnutrition: Secondary | ICD-10-CM | POA: Diagnosis not present

## 2018-12-30 DIAGNOSIS — D631 Anemia in chronic kidney disease: Secondary | ICD-10-CM | POA: Diagnosis not present

## 2018-12-30 DIAGNOSIS — D509 Iron deficiency anemia, unspecified: Secondary | ICD-10-CM | POA: Diagnosis not present

## 2018-12-30 DIAGNOSIS — R82998 Other abnormal findings in urine: Secondary | ICD-10-CM | POA: Diagnosis not present

## 2018-12-31 DIAGNOSIS — R82998 Other abnormal findings in urine: Secondary | ICD-10-CM | POA: Diagnosis not present

## 2018-12-31 DIAGNOSIS — Z23 Encounter for immunization: Secondary | ICD-10-CM | POA: Diagnosis not present

## 2018-12-31 DIAGNOSIS — E440 Moderate protein-calorie malnutrition: Secondary | ICD-10-CM | POA: Diagnosis not present

## 2018-12-31 DIAGNOSIS — D509 Iron deficiency anemia, unspecified: Secondary | ICD-10-CM | POA: Diagnosis not present

## 2018-12-31 DIAGNOSIS — Z79899 Other long term (current) drug therapy: Secondary | ICD-10-CM | POA: Diagnosis not present

## 2018-12-31 DIAGNOSIS — R17 Unspecified jaundice: Secondary | ICD-10-CM | POA: Diagnosis not present

## 2018-12-31 DIAGNOSIS — N2581 Secondary hyperparathyroidism of renal origin: Secondary | ICD-10-CM | POA: Diagnosis not present

## 2018-12-31 DIAGNOSIS — D631 Anemia in chronic kidney disease: Secondary | ICD-10-CM | POA: Diagnosis not present

## 2018-12-31 DIAGNOSIS — N186 End stage renal disease: Secondary | ICD-10-CM | POA: Diagnosis not present

## 2019-01-01 ENCOUNTER — Other Ambulatory Visit: Payer: Medicare Other

## 2019-01-01 DIAGNOSIS — Z79899 Other long term (current) drug therapy: Secondary | ICD-10-CM | POA: Diagnosis not present

## 2019-01-01 DIAGNOSIS — D509 Iron deficiency anemia, unspecified: Secondary | ICD-10-CM | POA: Diagnosis not present

## 2019-01-01 DIAGNOSIS — N2581 Secondary hyperparathyroidism of renal origin: Secondary | ICD-10-CM | POA: Diagnosis not present

## 2019-01-01 DIAGNOSIS — R17 Unspecified jaundice: Secondary | ICD-10-CM | POA: Diagnosis not present

## 2019-01-01 DIAGNOSIS — D631 Anemia in chronic kidney disease: Secondary | ICD-10-CM | POA: Diagnosis not present

## 2019-01-01 DIAGNOSIS — R82998 Other abnormal findings in urine: Secondary | ICD-10-CM | POA: Diagnosis not present

## 2019-01-01 DIAGNOSIS — Z23 Encounter for immunization: Secondary | ICD-10-CM | POA: Diagnosis not present

## 2019-01-01 DIAGNOSIS — E440 Moderate protein-calorie malnutrition: Secondary | ICD-10-CM | POA: Diagnosis not present

## 2019-01-01 DIAGNOSIS — N186 End stage renal disease: Secondary | ICD-10-CM | POA: Diagnosis not present

## 2019-01-02 DIAGNOSIS — Z23 Encounter for immunization: Secondary | ICD-10-CM | POA: Diagnosis not present

## 2019-01-02 DIAGNOSIS — R82998 Other abnormal findings in urine: Secondary | ICD-10-CM | POA: Diagnosis not present

## 2019-01-02 DIAGNOSIS — E440 Moderate protein-calorie malnutrition: Secondary | ICD-10-CM | POA: Diagnosis not present

## 2019-01-02 DIAGNOSIS — D509 Iron deficiency anemia, unspecified: Secondary | ICD-10-CM | POA: Diagnosis not present

## 2019-01-02 DIAGNOSIS — Z79899 Other long term (current) drug therapy: Secondary | ICD-10-CM | POA: Diagnosis not present

## 2019-01-02 DIAGNOSIS — N2581 Secondary hyperparathyroidism of renal origin: Secondary | ICD-10-CM | POA: Diagnosis not present

## 2019-01-02 DIAGNOSIS — R17 Unspecified jaundice: Secondary | ICD-10-CM | POA: Diagnosis not present

## 2019-01-02 DIAGNOSIS — D631 Anemia in chronic kidney disease: Secondary | ICD-10-CM | POA: Diagnosis not present

## 2019-01-02 DIAGNOSIS — N186 End stage renal disease: Secondary | ICD-10-CM | POA: Diagnosis not present

## 2019-01-03 ENCOUNTER — Ambulatory Visit (HOSPITAL_COMMUNITY): Payer: Medicare Other

## 2019-01-03 DIAGNOSIS — D631 Anemia in chronic kidney disease: Secondary | ICD-10-CM | POA: Diagnosis not present

## 2019-01-03 DIAGNOSIS — D509 Iron deficiency anemia, unspecified: Secondary | ICD-10-CM | POA: Diagnosis not present

## 2019-01-03 DIAGNOSIS — Z23 Encounter for immunization: Secondary | ICD-10-CM | POA: Diagnosis not present

## 2019-01-03 DIAGNOSIS — E440 Moderate protein-calorie malnutrition: Secondary | ICD-10-CM | POA: Diagnosis not present

## 2019-01-03 DIAGNOSIS — N2581 Secondary hyperparathyroidism of renal origin: Secondary | ICD-10-CM | POA: Diagnosis not present

## 2019-01-03 DIAGNOSIS — R82998 Other abnormal findings in urine: Secondary | ICD-10-CM | POA: Diagnosis not present

## 2019-01-03 DIAGNOSIS — N186 End stage renal disease: Secondary | ICD-10-CM | POA: Diagnosis not present

## 2019-01-03 DIAGNOSIS — R17 Unspecified jaundice: Secondary | ICD-10-CM | POA: Diagnosis not present

## 2019-01-03 DIAGNOSIS — Z79899 Other long term (current) drug therapy: Secondary | ICD-10-CM | POA: Diagnosis not present

## 2019-01-04 ENCOUNTER — Emergency Department (HOSPITAL_COMMUNITY)
Admission: EM | Admit: 2019-01-04 | Discharge: 2019-01-04 | Disposition: A | Discharge status: Home / Self Care | Payer: Medicare Other | Attending: Emergency Medicine | Admitting: Emergency Medicine

## 2019-01-04 ENCOUNTER — Other Ambulatory Visit: Payer: Self-pay

## 2019-01-04 ENCOUNTER — Encounter (HOSPITAL_COMMUNITY): Payer: Self-pay | Admitting: Emergency Medicine

## 2019-01-04 ENCOUNTER — Emergency Department (HOSPITAL_COMMUNITY): Payer: Medicare Other

## 2019-01-04 DIAGNOSIS — R17 Unspecified jaundice: Secondary | ICD-10-CM | POA: Diagnosis not present

## 2019-01-04 DIAGNOSIS — D631 Anemia in chronic kidney disease: Secondary | ICD-10-CM | POA: Diagnosis not present

## 2019-01-04 DIAGNOSIS — E876 Hypokalemia: Secondary | ICD-10-CM | POA: Diagnosis not present

## 2019-01-04 DIAGNOSIS — Z79899 Other long term (current) drug therapy: Secondary | ICD-10-CM | POA: Diagnosis not present

## 2019-01-04 DIAGNOSIS — R531 Weakness: Secondary | ICD-10-CM | POA: Diagnosis not present

## 2019-01-04 DIAGNOSIS — Z992 Dependence on renal dialysis: Secondary | ICD-10-CM | POA: Insufficient documentation

## 2019-01-04 DIAGNOSIS — E860 Dehydration: Secondary | ICD-10-CM | POA: Diagnosis not present

## 2019-01-04 DIAGNOSIS — I10 Essential (primary) hypertension: Secondary | ICD-10-CM | POA: Diagnosis not present

## 2019-01-04 DIAGNOSIS — N2581 Secondary hyperparathyroidism of renal origin: Secondary | ICD-10-CM | POA: Diagnosis not present

## 2019-01-04 DIAGNOSIS — D509 Iron deficiency anemia, unspecified: Secondary | ICD-10-CM | POA: Diagnosis not present

## 2019-01-04 DIAGNOSIS — Z7982 Long term (current) use of aspirin: Secondary | ICD-10-CM | POA: Insufficient documentation

## 2019-01-04 DIAGNOSIS — N186 End stage renal disease: Secondary | ICD-10-CM | POA: Diagnosis not present

## 2019-01-04 DIAGNOSIS — R1110 Vomiting, unspecified: Secondary | ICD-10-CM | POA: Diagnosis not present

## 2019-01-04 DIAGNOSIS — E1122 Type 2 diabetes mellitus with diabetic chronic kidney disease: Secondary | ICD-10-CM | POA: Diagnosis not present

## 2019-01-04 DIAGNOSIS — R197 Diarrhea, unspecified: Secondary | ICD-10-CM | POA: Diagnosis not present

## 2019-01-04 DIAGNOSIS — I120 Hypertensive chronic kidney disease with stage 5 chronic kidney disease or end stage renal disease: Secondary | ICD-10-CM | POA: Insufficient documentation

## 2019-01-04 DIAGNOSIS — R82998 Other abnormal findings in urine: Secondary | ICD-10-CM | POA: Diagnosis not present

## 2019-01-04 DIAGNOSIS — Z23 Encounter for immunization: Secondary | ICD-10-CM | POA: Diagnosis not present

## 2019-01-04 DIAGNOSIS — E440 Moderate protein-calorie malnutrition: Secondary | ICD-10-CM | POA: Diagnosis not present

## 2019-01-04 LAB — CBC WITH DIFFERENTIAL/PLATELET
Abs Immature Granulocytes: 0.07 10*3/uL (ref 0.00–0.07)
Basophils Absolute: 0.1 10*3/uL (ref 0.0–0.1)
Basophils Relative: 1 %
EOS ABS: 0.2 10*3/uL (ref 0.0–0.5)
Eosinophils Relative: 2 %
HCT: 43.3 % (ref 36.0–46.0)
Hemoglobin: 13 g/dL (ref 12.0–15.0)
IMMATURE GRANULOCYTES: 1 %
Lymphocytes Relative: 20 %
Lymphs Abs: 2.2 10*3/uL (ref 0.7–4.0)
MCH: 30.3 pg (ref 26.0–34.0)
MCHC: 30 g/dL (ref 30.0–36.0)
MCV: 100.9 fL — ABNORMAL HIGH (ref 80.0–100.0)
Monocytes Absolute: 0.7 10*3/uL (ref 0.1–1.0)
Monocytes Relative: 6 %
Neutro Abs: 7.6 10*3/uL (ref 1.7–7.7)
Neutrophils Relative %: 70 %
Platelets: 289 10*3/uL (ref 150–400)
RBC: 4.29 MIL/uL (ref 3.87–5.11)
RDW: 16 % — AB (ref 11.5–15.5)
WBC: 10.8 10*3/uL — ABNORMAL HIGH (ref 4.0–10.5)
nRBC: 0.3 % — ABNORMAL HIGH (ref 0.0–0.2)

## 2019-01-04 LAB — COMPREHENSIVE METABOLIC PANEL
ALT: 27 U/L (ref 0–44)
AST: 29 U/L (ref 15–41)
Albumin: 3.7 g/dL (ref 3.5–5.0)
Alkaline Phosphatase: 163 U/L — ABNORMAL HIGH (ref 38–126)
Anion gap: 22 — ABNORMAL HIGH (ref 5–15)
BUN: 44 mg/dL — ABNORMAL HIGH (ref 8–23)
CHLORIDE: 94 mmol/L — AB (ref 98–111)
CO2: 22 mmol/L (ref 22–32)
CREATININE: 11.65 mg/dL — AB (ref 0.44–1.00)
Calcium: 10 mg/dL (ref 8.9–10.3)
GFR calc Af Amer: 3 mL/min — ABNORMAL LOW (ref >60)
GFR calc non Af Amer: 3 mL/min — ABNORMAL LOW (ref >60)
Glucose, Bld: 168 mg/dL — ABNORMAL HIGH (ref 70–99)
Potassium: 2.8 mmol/L — ABNORMAL LOW (ref 3.5–5.1)
Sodium: 138 mmol/L (ref 135–145)
Total Bilirubin: 0.7 mg/dL (ref 0.3–1.2)
Total Protein: 7.9 g/dL (ref 6.5–8.1)

## 2019-01-04 LAB — POCT I-STAT EG7
Acid-Base Excess: 2 mmol/L (ref 0.0–2.0)
Bicarbonate: 28.2 mmol/L — ABNORMAL HIGH (ref 20.0–28.0)
Calcium, Ion: 1.07 mmol/L — ABNORMAL LOW (ref 1.15–1.40)
HCT: 43 % (ref 36.0–46.0)
Hemoglobin: 14.6 g/dL (ref 12.0–15.0)
O2 Saturation: 83 %
POTASSIUM: 2.9 mmol/L — AB (ref 3.5–5.1)
Sodium: 137 mmol/L (ref 135–145)
TCO2: 30 mmol/L (ref 22–32)
pCO2, Ven: 47.9 mmHg (ref 44.0–60.0)
pH, Ven: 7.377 (ref 7.250–7.430)
pO2, Ven: 49 mmHg — ABNORMAL HIGH (ref 32.0–45.0)

## 2019-01-04 LAB — LIPASE, BLOOD: LIPASE: 35 U/L (ref 11–51)

## 2019-01-04 LAB — TROPONIN I: Troponin I: <0.03 ng/mL (ref <0.03)

## 2019-01-04 MED ORDER — POTASSIUM CHLORIDE CRYS ER 20 MEQ PO TBCR
10.0000 meq | EXTENDED_RELEASE_TABLET | Freq: Once | ORAL | Status: AC
  Administered 2019-01-04: 10 meq via ORAL
  Filled 2019-01-04: qty 1

## 2019-01-04 NOTE — ED Provider Notes (Signed)
[facility] EMERGENCY DEPARTMENT Provider Note   CSN: [id number] Arrival date & time: 01/04/19  4098     History   Chief Complaint Chief Complaint  Patient presents with  . Weakness  . Emesis    HPI Eileen Perez is a 78 y.o. female with a past medical history of end-stage renal disease on peritoneal dialysis since 2017.  She presents emergency department today with complaint of weakness.  She has decreased appetite which is chronic.  2 days ago she had retching without vomiting.  Yesterday she was nauseated and had one episode of vomiting.  Patient states that she went to make sure she did not have bacterial peritonitis so she came to the ER.  She denies fevers or chills or abdominal pain.  She is not had any diarrhea.  Patient also denies chest pain, shortness of breath.  HPI  Past Medical History:  Diagnosis Date  . Anemia   . Arthritis    Osteoarthritis  . Chronic kidney disease    Chronic renal insufficiency  . Diabetes mellitus    Pre  . Diverticulosis   . GERD (gastroesophageal reflux disease)   . Gout   . Hiatal hernia   . History of blood transfusion   . Hyperlipidemia   . Hypertension   . Osteopenia   . Pancreatic cyst 1999  . Thyroid disease    Hyperparathyroid     Patient Active Problem List   Diagnosis Date Noted  . Aortic atherosclerosis ([place]) 02/13/2018  . 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 Overweight (BMI 25.0-29.9) 10/31/2015  . ESRD (end stage renal disease) ([place]) 10/31/2015  . OA (osteoarthritis) of hip 05/26/2015  . Secondary hyperparathyroidism ([facility]) 04/16/2014  . Vitamin D deficiency 04/16/2014  . Hyperlipidemia 04/16/2014  . Medication management 04/16/2014  . Anemia of chronic Renal Dz   . Pancreatic cyst   . GERD   . Gout   . Osteopenia   . Hypertension 06/10/2013  . DJD 06/09/2013    Past Surgical History:  Procedure Laterality Date  . CHOLECYSTECTOMY    . COLONOSCOPY  03/21/12   Next one in 2018  . EYE  SURGERY Left    cataract extraction with IOL  . INSERTION OF DIALYSIS CATHETER N/A 02/15/2017   Procedure: REVISION OF DIALYSIS CATHETER;  Surgeon: Algernon Huxley, MD;  Location: [facility] ORS;  Service: Cardiovascular;  Laterality: N/A;  . JOINT REPLACEMENT  2012   left knee  . left knee replacement     . PANCREATIC CYST EXCISION  1999  . TOTAL HIP ARTHROPLASTY Right 05/26/2015   Procedure: RIGHT TOTAL HIP ARTHROPLASTY ANTERIOR APPROACH;  Surgeon: Gaynelle Arabian, MD;  Location: [facility] ORS;  Service: Orthopedics;  Laterality: Right;  . TOTAL KNEE ARTHROPLASTY Right 06/09/2013   Procedure: RIGHT TOTAL KNEE ARTHROPLASTY;  Surgeon: Gearlean Alf, MD;  Location: [facility] ORS;  Service: Orthopedics;  Laterality: Right;     OB History   No obstetric history on file.      Home Medications    Prior to Admission medications   Medication Sig Start Date End Date Taking? Authorizing Provider  allopurinol (ZYLOPRIM) 300 MG tablet TAKE 1/2 TO 1 TABLET BY  MOUTH DAILY AS DIRECTED 10/30/18   Liane Comber, NP  aspirin 81 MG tablet Take 81 mg by mouth at bedtime.     [provider]  b complex-vitamin c-folic acid (NEPHRO-VITE) 0.8 MG TABS tablet Take 1 tablet by mouth at bedtime.    [provider]  calcitRIOL (ROCALTROL) 0.5 MCG capsule Take 0.5 mcg by mouth daily.     [provider]  calcium acetate (PHOSLO) 667 MG capsule Take 667 mg by mouth 3 (three) times daily with meals.  11/15/16   [provider]  Cholecalciferol (VITAMIN D-3 PO) Take 5,000 Units by mouth every other day.     [provider]  cinacalcet (SENSIPAR) 30 MG tablet Take 30 mg by mouth 2 (two) times daily.     [provider]  docusate sodium (COLACE) 100 MG capsule Take 100 mg by mouth 2 (two) times daily.    [provider]  Flaxseed, Linseed, (FLAXSEED OIL) 1000 MG CAPS Take 1 capsule by mouth daily.     [provider]  glucose blood (ACCU-CHEK AVIVA PLUS) test strip Test  once daily 10/26/15   Unk Pinto, MD  hydrALAZINE (APRESOLINE) 10 MG tablet Take 1 tablet (10 mg total) by mouth 2 (two) times daily. PT IS OUT OF MEDICATION-PLEASE SEND OVERNIGHT 08/13/18   Lendon Colonel, NP  labetalol (NORMODYNE) 200 MG tablet TAKE 1 TABLET BY MOUTH TWO  TIMES DAILY 07/17/18   Liane Comber, NP  Omega-3 Fatty Acids (FISH OIL) 1200 MG CAPS Take 2 capsules by mouth daily.    [provider]  sevelamer carbonate (RENVELA) 800 MG tablet Take 1,600 mg by mouth 3 (three) times daily with meals.     [provider]  zinc gluconate 50 MG tablet Take 50 mg by mouth every morning.    [provider]    Family History Family History  Problem Relation Age of Onset  . Cancer Father        Prostate and Throat  . Hypertension Mother   . Cancer Mother        Colon with METS  . Diabetes Mother     Social History Social History   Tobacco Use  . Smoking status: Never Smoker  . Smokeless tobacco: Never Used  Substance Use Topics  . Alcohol use: No    Alcohol/week: 0.0 standard drinks  . Drug use: No     Allergies   Ace inhibitors and Nsaids   Review of Systems Review of Systems  Ten systems reviewed and are negative for acute change, except as noted in the HPI.   Physical Exam Updated Vital Signs BP (!) 163/82 (BP Location: Right Arm)   Pulse 90   Temp 97.9 F (36.6 C)   Resp 16   Ht 5\' 2"  (1.575 m)   Wt 68.9 kg   SpO2 100%   BMI 27.80 kg/m   Physical Exam Vitals signs and nursing note reviewed.  Constitutional:      General: She is not in acute distress.    Appearance: She is well-developed. She is not diaphoretic.  HENT:     Head: Normocephalic and atraumatic.  Eyes:     General: No scleral icterus.    Conjunctiva/sclera: Conjunctivae normal.  Neck:     Musculoskeletal: Normal range of motion.  Cardiovascular:     Rate and Rhythm: Normal rate and regular rhythm.     Heart sounds: Normal heart sounds. No murmur.  No friction rub. No gallop.   Pulmonary:     Effort: Pulmonary effort is normal. No respiratory distress.     Breath sounds: Normal breath sounds.  Abdominal:     General: Bowel sounds are normal. There is no distension.     Palpations: Abdomen is soft. There is no mass.  Tenderness: There is no abdominal tenderness. There is no guarding.     Comments: Dialysis catheter in place in the abdomen.  No tenderness  Skin:    General: Skin is warm and dry.  Neurological:     Mental Status: She is alert and oriented to person, place, and time.  Psychiatric:        Behavior: Behavior normal.      ED Treatments / Results  Labs (all labs ordered are listed, but only abnormal results are displayed) Labs Reviewed - No data to display  EKG EKG Interpretation  Date/Time:  Saturday January 04 2019 10:01:04 EST Ventricular Rate:  82 PR Interval:    QRS Duration: 86 QT Interval:  391 QTC Calculation: 457 R Axis:   -21 Text Interpretation:  Sinus rhythm Atrial premature complex Borderline left axis deviation Borderline T abnormalities, anterior leads Baseline wander in lead(s) V2 Confirmed by Lennice Sites [phone]) on 01/04/2019 10:09:08 AM   Radiology No results found.  Procedures Procedures (including critical care time)  Medications Ordered in ED Medications - No data to display   Initial Impression / Assessment and Plan / ED Course  I have reviewed the triage vital signs and the nursing notes.  Pertinent labs & imaging results that were available during my care of the patient were reviewed by me and considered in my medical decision making (see chart for details).     78 year old female here with weakness.  She has no nausea or vomiting today.  Her labs show low potassium level.  Patient states that her doctor told her potassium was low 1 week ago when she had her labs done in which she was told to eat high potassium foods.  Patient states that she does not have much of an  appetite and does not eat a lot so thinks that she was unable to replenish her potassium stores.  Discussed the case with Dr. Ronnald Nian will give her 38 M EQ of Potassium. Patient labs at baseline. The patient has evidence of atypical presentation of ACS with a negative troponin, EKG without acute ischemic changes.  Chest x-ray shows no acute abnormalities.  I personally reviewed the PA film at bedside. Patient will be discharged to follow-up with her primary care physician.  She has labs every 2 weeks and is due for labs coming early next week.  Continue with home dialysis.  Patient appears appropriate for discharge at this time. Final Clinical Impressions(s) / ED Diagnoses   Final diagnoses:  None    ED Discharge Orders    None       Margarita Mail, PA-C 01/04/19 1750    Lennice Sites, DO 01/08/19 1523

## 2019-01-04 NOTE — ED Provider Notes (Signed)
Medical screening examination/treatment/procedure(s) were conducted as a shared visit with non-physician practitioner(s) and myself.  I personally evaluated the patient during the encounter. Briefly, the patient is a 78 y.o. female end-stage renal disease on peritoneal dialysis who presents to the ED with nausea, vomiting, diarrhea for the last 2 days.  Patient with no abdominal tenderness.  Normal vitals.  Peritoneal dialysis site is overall well-appearing.  No signs of peritonitis on exam.  No focal abdominal tenderness.  Neurologically patient is intact.  Possible sick contacts.  No specific food exposure.  Patient had no significant anemia, electrolyte abnormality, leukocytosis.  Patient does not make any urine.  Remained hemodynamically stable throughout her care.  Likely mild viral process.  Recommend follow-up with primary care doctor.  Had some mild hypokalemia and was given some repletion.  She is made aware and will make correction for further dialysis.  Discharged from ED in good condition.  EKG overall unremarkable.  No concern for ACS or pulmonary process.  This chart was dictated using voice recognition software.  Despite best efforts to proofread,  errors can occur which can change the documentation meaning.     EKG Interpretation  Date/Time:  Saturday January 04 2019 10:01:04 EST Ventricular Rate:  82 PR Interval:    QRS Duration: 86 QT Interval:  391 QTC Calculation: 457 R Axis:   -21 Text Interpretation:  Sinus rhythm Atrial premature complex Borderline left axis deviation Borderline T abnormalities, anterior leads Baseline wander in lead(s) V2 Confirmed by Lennice Sites [phone]) on 01/04/2019 10:09:08 AM          Lennice Sites, DO 01/04/19 1733

## 2019-01-04 NOTE — ED Triage Notes (Signed)
Pt. Arrives POV. Pt complains of weakness and vomiting x3 days. Pt. States that she has "just felt off" over the past few days. Pt. Does peritoneal dialysis on Saturdays.

## 2019-01-04 NOTE — Discharge Instructions (Signed)
Get help right away if: You feel confused. Your vision is blurry. You feel faint or you pass out. You have a severe headache. You have severe pain in your abdomen, your back, or the area between your waist and hips (pelvis). You have chest pain, shortness of breath, or an irregular or fast heartbeat. You are unable to urinate, or you urinate less than normal. You have abnormal bleeding, such as bleeding from the rectum, vagina, nose, lungs, or nipples. You vomit blood. You have thoughts about hurting yourself or others. 

## 2019-01-05 DIAGNOSIS — N186 End stage renal disease: Secondary | ICD-10-CM | POA: Diagnosis not present

## 2019-01-05 DIAGNOSIS — R82998 Other abnormal findings in urine: Secondary | ICD-10-CM | POA: Diagnosis not present

## 2019-01-05 DIAGNOSIS — Z23 Encounter for immunization: Secondary | ICD-10-CM | POA: Diagnosis not present

## 2019-01-05 DIAGNOSIS — E440 Moderate protein-calorie malnutrition: Secondary | ICD-10-CM | POA: Diagnosis not present

## 2019-01-05 DIAGNOSIS — Z79899 Other long term (current) drug therapy: Secondary | ICD-10-CM | POA: Diagnosis not present

## 2019-01-05 DIAGNOSIS — D631 Anemia in chronic kidney disease: Secondary | ICD-10-CM | POA: Diagnosis not present

## 2019-01-05 DIAGNOSIS — R17 Unspecified jaundice: Secondary | ICD-10-CM | POA: Diagnosis not present

## 2019-01-05 DIAGNOSIS — D509 Iron deficiency anemia, unspecified: Secondary | ICD-10-CM | POA: Diagnosis not present

## 2019-01-05 DIAGNOSIS — N2581 Secondary hyperparathyroidism of renal origin: Secondary | ICD-10-CM | POA: Diagnosis not present

## 2019-01-06 DIAGNOSIS — N2581 Secondary hyperparathyroidism of renal origin: Secondary | ICD-10-CM | POA: Diagnosis not present

## 2019-01-06 DIAGNOSIS — Z23 Encounter for immunization: Secondary | ICD-10-CM | POA: Diagnosis not present

## 2019-01-06 DIAGNOSIS — N186 End stage renal disease: Secondary | ICD-10-CM | POA: Diagnosis not present

## 2019-01-06 DIAGNOSIS — D509 Iron deficiency anemia, unspecified: Secondary | ICD-10-CM | POA: Diagnosis not present

## 2019-01-06 DIAGNOSIS — R82998 Other abnormal findings in urine: Secondary | ICD-10-CM | POA: Diagnosis not present

## 2019-01-06 DIAGNOSIS — Z79899 Other long term (current) drug therapy: Secondary | ICD-10-CM | POA: Diagnosis not present

## 2019-01-06 DIAGNOSIS — R17 Unspecified jaundice: Secondary | ICD-10-CM | POA: Diagnosis not present

## 2019-01-06 DIAGNOSIS — D631 Anemia in chronic kidney disease: Secondary | ICD-10-CM | POA: Diagnosis not present

## 2019-01-06 DIAGNOSIS — E440 Moderate protein-calorie malnutrition: Secondary | ICD-10-CM | POA: Diagnosis not present

## 2019-01-07 DIAGNOSIS — Z79899 Other long term (current) drug therapy: Secondary | ICD-10-CM | POA: Diagnosis not present

## 2019-01-07 DIAGNOSIS — Z23 Encounter for immunization: Secondary | ICD-10-CM | POA: Diagnosis not present

## 2019-01-07 DIAGNOSIS — N2581 Secondary hyperparathyroidism of renal origin: Secondary | ICD-10-CM | POA: Diagnosis not present

## 2019-01-07 DIAGNOSIS — R82998 Other abnormal findings in urine: Secondary | ICD-10-CM | POA: Diagnosis not present

## 2019-01-07 DIAGNOSIS — D509 Iron deficiency anemia, unspecified: Secondary | ICD-10-CM | POA: Diagnosis not present

## 2019-01-07 DIAGNOSIS — N186 End stage renal disease: Secondary | ICD-10-CM | POA: Diagnosis not present

## 2019-01-07 DIAGNOSIS — D631 Anemia in chronic kidney disease: Secondary | ICD-10-CM | POA: Diagnosis not present

## 2019-01-07 DIAGNOSIS — R17 Unspecified jaundice: Secondary | ICD-10-CM | POA: Diagnosis not present

## 2019-01-07 DIAGNOSIS — E440 Moderate protein-calorie malnutrition: Secondary | ICD-10-CM | POA: Diagnosis not present

## 2019-01-08 DIAGNOSIS — E440 Moderate protein-calorie malnutrition: Secondary | ICD-10-CM | POA: Diagnosis not present

## 2019-01-08 DIAGNOSIS — N2581 Secondary hyperparathyroidism of renal origin: Secondary | ICD-10-CM | POA: Diagnosis not present

## 2019-01-08 DIAGNOSIS — D631 Anemia in chronic kidney disease: Secondary | ICD-10-CM | POA: Diagnosis not present

## 2019-01-08 DIAGNOSIS — Z23 Encounter for immunization: Secondary | ICD-10-CM | POA: Diagnosis not present

## 2019-01-08 DIAGNOSIS — Z79899 Other long term (current) drug therapy: Secondary | ICD-10-CM | POA: Diagnosis not present

## 2019-01-08 DIAGNOSIS — R82998 Other abnormal findings in urine: Secondary | ICD-10-CM | POA: Diagnosis not present

## 2019-01-08 DIAGNOSIS — R17 Unspecified jaundice: Secondary | ICD-10-CM | POA: Diagnosis not present

## 2019-01-08 DIAGNOSIS — D509 Iron deficiency anemia, unspecified: Secondary | ICD-10-CM | POA: Diagnosis not present

## 2019-01-08 DIAGNOSIS — N186 End stage renal disease: Secondary | ICD-10-CM | POA: Diagnosis not present

## 2019-01-09 DIAGNOSIS — Z23 Encounter for immunization: Secondary | ICD-10-CM | POA: Diagnosis not present

## 2019-01-09 DIAGNOSIS — R82998 Other abnormal findings in urine: Secondary | ICD-10-CM | POA: Diagnosis not present

## 2019-01-09 DIAGNOSIS — D631 Anemia in chronic kidney disease: Secondary | ICD-10-CM | POA: Diagnosis not present

## 2019-01-09 DIAGNOSIS — R17 Unspecified jaundice: Secondary | ICD-10-CM | POA: Diagnosis not present

## 2019-01-09 DIAGNOSIS — E440 Moderate protein-calorie malnutrition: Secondary | ICD-10-CM | POA: Diagnosis not present

## 2019-01-09 DIAGNOSIS — N2581 Secondary hyperparathyroidism of renal origin: Secondary | ICD-10-CM | POA: Diagnosis not present

## 2019-01-09 DIAGNOSIS — Z79899 Other long term (current) drug therapy: Secondary | ICD-10-CM | POA: Diagnosis not present

## 2019-01-09 DIAGNOSIS — N186 End stage renal disease: Secondary | ICD-10-CM | POA: Diagnosis not present

## 2019-01-09 DIAGNOSIS — D509 Iron deficiency anemia, unspecified: Secondary | ICD-10-CM | POA: Diagnosis not present

## 2019-01-10 DIAGNOSIS — R82998 Other abnormal findings in urine: Secondary | ICD-10-CM | POA: Diagnosis not present

## 2019-01-10 DIAGNOSIS — D631 Anemia in chronic kidney disease: Secondary | ICD-10-CM | POA: Diagnosis not present

## 2019-01-10 DIAGNOSIS — E440 Moderate protein-calorie malnutrition: Secondary | ICD-10-CM | POA: Diagnosis not present

## 2019-01-10 DIAGNOSIS — D509 Iron deficiency anemia, unspecified: Secondary | ICD-10-CM | POA: Diagnosis not present

## 2019-01-10 DIAGNOSIS — Z23 Encounter for immunization: Secondary | ICD-10-CM | POA: Diagnosis not present

## 2019-01-10 DIAGNOSIS — Z79899 Other long term (current) drug therapy: Secondary | ICD-10-CM | POA: Diagnosis not present

## 2019-01-10 DIAGNOSIS — N2581 Secondary hyperparathyroidism of renal origin: Secondary | ICD-10-CM | POA: Diagnosis not present

## 2019-01-10 DIAGNOSIS — R17 Unspecified jaundice: Secondary | ICD-10-CM | POA: Diagnosis not present

## 2019-01-10 DIAGNOSIS — N186 End stage renal disease: Secondary | ICD-10-CM | POA: Diagnosis not present

## 2019-01-11 DIAGNOSIS — N2581 Secondary hyperparathyroidism of renal origin: Secondary | ICD-10-CM | POA: Diagnosis not present

## 2019-01-11 DIAGNOSIS — R82998 Other abnormal findings in urine: Secondary | ICD-10-CM | POA: Diagnosis not present

## 2019-01-11 DIAGNOSIS — D509 Iron deficiency anemia, unspecified: Secondary | ICD-10-CM | POA: Diagnosis not present

## 2019-01-11 DIAGNOSIS — N186 End stage renal disease: Secondary | ICD-10-CM | POA: Diagnosis not present

## 2019-01-11 DIAGNOSIS — Z79899 Other long term (current) drug therapy: Secondary | ICD-10-CM | POA: Diagnosis not present

## 2019-01-11 DIAGNOSIS — Z23 Encounter for immunization: Secondary | ICD-10-CM | POA: Diagnosis not present

## 2019-01-11 DIAGNOSIS — R17 Unspecified jaundice: Secondary | ICD-10-CM | POA: Diagnosis not present

## 2019-01-11 DIAGNOSIS — D631 Anemia in chronic kidney disease: Secondary | ICD-10-CM | POA: Diagnosis not present

## 2019-01-11 DIAGNOSIS — E440 Moderate protein-calorie malnutrition: Secondary | ICD-10-CM | POA: Diagnosis not present

## 2019-01-12 DIAGNOSIS — N2581 Secondary hyperparathyroidism of renal origin: Secondary | ICD-10-CM | POA: Diagnosis not present

## 2019-01-12 DIAGNOSIS — Z23 Encounter for immunization: Secondary | ICD-10-CM | POA: Diagnosis not present

## 2019-01-12 DIAGNOSIS — Z79899 Other long term (current) drug therapy: Secondary | ICD-10-CM | POA: Diagnosis not present

## 2019-01-12 DIAGNOSIS — D509 Iron deficiency anemia, unspecified: Secondary | ICD-10-CM | POA: Diagnosis not present

## 2019-01-12 DIAGNOSIS — E440 Moderate protein-calorie malnutrition: Secondary | ICD-10-CM | POA: Diagnosis not present

## 2019-01-12 DIAGNOSIS — R82998 Other abnormal findings in urine: Secondary | ICD-10-CM | POA: Diagnosis not present

## 2019-01-12 DIAGNOSIS — N186 End stage renal disease: Secondary | ICD-10-CM | POA: Diagnosis not present

## 2019-01-12 DIAGNOSIS — R17 Unspecified jaundice: Secondary | ICD-10-CM | POA: Diagnosis not present

## 2019-01-12 DIAGNOSIS — D631 Anemia in chronic kidney disease: Secondary | ICD-10-CM | POA: Diagnosis not present

## 2019-01-13 DIAGNOSIS — R82998 Other abnormal findings in urine: Secondary | ICD-10-CM | POA: Diagnosis not present

## 2019-01-13 DIAGNOSIS — Z23 Encounter for immunization: Secondary | ICD-10-CM | POA: Diagnosis not present

## 2019-01-13 DIAGNOSIS — R17 Unspecified jaundice: Secondary | ICD-10-CM | POA: Diagnosis not present

## 2019-01-13 DIAGNOSIS — E440 Moderate protein-calorie malnutrition: Secondary | ICD-10-CM | POA: Diagnosis not present

## 2019-01-13 DIAGNOSIS — N2581 Secondary hyperparathyroidism of renal origin: Secondary | ICD-10-CM | POA: Diagnosis not present

## 2019-01-13 DIAGNOSIS — D509 Iron deficiency anemia, unspecified: Secondary | ICD-10-CM | POA: Diagnosis not present

## 2019-01-13 DIAGNOSIS — N186 End stage renal disease: Secondary | ICD-10-CM | POA: Diagnosis not present

## 2019-01-13 DIAGNOSIS — D631 Anemia in chronic kidney disease: Secondary | ICD-10-CM | POA: Diagnosis not present

## 2019-01-13 DIAGNOSIS — Z79899 Other long term (current) drug therapy: Secondary | ICD-10-CM | POA: Diagnosis not present

## 2019-01-14 DIAGNOSIS — N186 End stage renal disease: Secondary | ICD-10-CM | POA: Diagnosis not present

## 2019-01-14 DIAGNOSIS — Z79899 Other long term (current) drug therapy: Secondary | ICD-10-CM | POA: Diagnosis not present

## 2019-01-14 DIAGNOSIS — N2581 Secondary hyperparathyroidism of renal origin: Secondary | ICD-10-CM | POA: Diagnosis not present

## 2019-01-14 DIAGNOSIS — D631 Anemia in chronic kidney disease: Secondary | ICD-10-CM | POA: Diagnosis not present

## 2019-01-14 DIAGNOSIS — R17 Unspecified jaundice: Secondary | ICD-10-CM | POA: Diagnosis not present

## 2019-01-14 DIAGNOSIS — E440 Moderate protein-calorie malnutrition: Secondary | ICD-10-CM | POA: Diagnosis not present

## 2019-01-14 DIAGNOSIS — D509 Iron deficiency anemia, unspecified: Secondary | ICD-10-CM | POA: Diagnosis not present

## 2019-01-14 DIAGNOSIS — Z23 Encounter for immunization: Secondary | ICD-10-CM | POA: Diagnosis not present

## 2019-01-14 DIAGNOSIS — R82998 Other abnormal findings in urine: Secondary | ICD-10-CM | POA: Diagnosis not present

## 2019-01-15 ENCOUNTER — Encounter: Payer: Self-pay | Admitting: Physician Assistant

## 2019-01-15 ENCOUNTER — Other Ambulatory Visit: Payer: Self-pay | Admitting: Adult Health

## 2019-01-15 DIAGNOSIS — R17 Unspecified jaundice: Secondary | ICD-10-CM | POA: Diagnosis not present

## 2019-01-15 DIAGNOSIS — Z79899 Other long term (current) drug therapy: Secondary | ICD-10-CM | POA: Diagnosis not present

## 2019-01-15 DIAGNOSIS — N2581 Secondary hyperparathyroidism of renal origin: Secondary | ICD-10-CM | POA: Diagnosis not present

## 2019-01-15 DIAGNOSIS — D631 Anemia in chronic kidney disease: Secondary | ICD-10-CM | POA: Diagnosis not present

## 2019-01-15 DIAGNOSIS — E440 Moderate protein-calorie malnutrition: Secondary | ICD-10-CM | POA: Diagnosis not present

## 2019-01-15 DIAGNOSIS — D509 Iron deficiency anemia, unspecified: Secondary | ICD-10-CM | POA: Diagnosis not present

## 2019-01-15 DIAGNOSIS — R82998 Other abnormal findings in urine: Secondary | ICD-10-CM | POA: Diagnosis not present

## 2019-01-15 DIAGNOSIS — Z23 Encounter for immunization: Secondary | ICD-10-CM | POA: Diagnosis not present

## 2019-01-15 DIAGNOSIS — N186 End stage renal disease: Secondary | ICD-10-CM | POA: Diagnosis not present

## 2019-01-16 DIAGNOSIS — N186 End stage renal disease: Secondary | ICD-10-CM | POA: Diagnosis not present

## 2019-01-16 DIAGNOSIS — E440 Moderate protein-calorie malnutrition: Secondary | ICD-10-CM | POA: Diagnosis not present

## 2019-01-16 DIAGNOSIS — Z79899 Other long term (current) drug therapy: Secondary | ICD-10-CM | POA: Diagnosis not present

## 2019-01-16 DIAGNOSIS — Z23 Encounter for immunization: Secondary | ICD-10-CM | POA: Diagnosis not present

## 2019-01-16 DIAGNOSIS — R17 Unspecified jaundice: Secondary | ICD-10-CM | POA: Diagnosis not present

## 2019-01-16 DIAGNOSIS — D631 Anemia in chronic kidney disease: Secondary | ICD-10-CM | POA: Diagnosis not present

## 2019-01-16 DIAGNOSIS — D509 Iron deficiency anemia, unspecified: Secondary | ICD-10-CM | POA: Diagnosis not present

## 2019-01-16 DIAGNOSIS — R82998 Other abnormal findings in urine: Secondary | ICD-10-CM | POA: Diagnosis not present

## 2019-01-16 DIAGNOSIS — N2581 Secondary hyperparathyroidism of renal origin: Secondary | ICD-10-CM | POA: Diagnosis not present

## 2019-01-17 DIAGNOSIS — R82998 Other abnormal findings in urine: Secondary | ICD-10-CM | POA: Diagnosis not present

## 2019-01-17 DIAGNOSIS — Z79899 Other long term (current) drug therapy: Secondary | ICD-10-CM | POA: Diagnosis not present

## 2019-01-17 DIAGNOSIS — D631 Anemia in chronic kidney disease: Secondary | ICD-10-CM | POA: Diagnosis not present

## 2019-01-17 DIAGNOSIS — R17 Unspecified jaundice: Secondary | ICD-10-CM | POA: Diagnosis not present

## 2019-01-17 DIAGNOSIS — N2581 Secondary hyperparathyroidism of renal origin: Secondary | ICD-10-CM | POA: Diagnosis not present

## 2019-01-17 DIAGNOSIS — N186 End stage renal disease: Secondary | ICD-10-CM | POA: Diagnosis not present

## 2019-01-17 DIAGNOSIS — Z23 Encounter for immunization: Secondary | ICD-10-CM | POA: Diagnosis not present

## 2019-01-17 DIAGNOSIS — E440 Moderate protein-calorie malnutrition: Secondary | ICD-10-CM | POA: Diagnosis not present

## 2019-01-17 DIAGNOSIS — D509 Iron deficiency anemia, unspecified: Secondary | ICD-10-CM | POA: Diagnosis not present

## 2019-01-18 DIAGNOSIS — E1122 Type 2 diabetes mellitus with diabetic chronic kidney disease: Secondary | ICD-10-CM | POA: Diagnosis not present

## 2019-01-18 DIAGNOSIS — R17 Unspecified jaundice: Secondary | ICD-10-CM | POA: Diagnosis not present

## 2019-01-18 DIAGNOSIS — N186 End stage renal disease: Secondary | ICD-10-CM | POA: Diagnosis not present

## 2019-01-18 DIAGNOSIS — N2581 Secondary hyperparathyroidism of renal origin: Secondary | ICD-10-CM | POA: Diagnosis not present

## 2019-01-18 DIAGNOSIS — D631 Anemia in chronic kidney disease: Secondary | ICD-10-CM | POA: Diagnosis not present

## 2019-01-18 DIAGNOSIS — E440 Moderate protein-calorie malnutrition: Secondary | ICD-10-CM | POA: Diagnosis not present

## 2019-01-18 DIAGNOSIS — Z23 Encounter for immunization: Secondary | ICD-10-CM | POA: Diagnosis not present

## 2019-01-18 DIAGNOSIS — R82998 Other abnormal findings in urine: Secondary | ICD-10-CM | POA: Diagnosis not present

## 2019-01-18 DIAGNOSIS — D509 Iron deficiency anemia, unspecified: Secondary | ICD-10-CM | POA: Diagnosis not present

## 2019-01-18 DIAGNOSIS — Z992 Dependence on renal dialysis: Secondary | ICD-10-CM | POA: Diagnosis not present

## 2019-01-18 DIAGNOSIS — Z79899 Other long term (current) drug therapy: Secondary | ICD-10-CM | POA: Diagnosis not present

## 2019-01-19 DIAGNOSIS — E440 Moderate protein-calorie malnutrition: Secondary | ICD-10-CM | POA: Diagnosis not present

## 2019-01-19 DIAGNOSIS — Z23 Encounter for immunization: Secondary | ICD-10-CM | POA: Diagnosis not present

## 2019-01-19 DIAGNOSIS — Z79899 Other long term (current) drug therapy: Secondary | ICD-10-CM | POA: Diagnosis not present

## 2019-01-19 DIAGNOSIS — D509 Iron deficiency anemia, unspecified: Secondary | ICD-10-CM | POA: Diagnosis not present

## 2019-01-19 DIAGNOSIS — R82998 Other abnormal findings in urine: Secondary | ICD-10-CM | POA: Diagnosis not present

## 2019-01-19 DIAGNOSIS — N186 End stage renal disease: Secondary | ICD-10-CM | POA: Diagnosis not present

## 2019-01-19 DIAGNOSIS — D631 Anemia in chronic kidney disease: Secondary | ICD-10-CM | POA: Diagnosis not present

## 2019-01-19 DIAGNOSIS — R17 Unspecified jaundice: Secondary | ICD-10-CM | POA: Diagnosis not present

## 2019-01-19 DIAGNOSIS — N2581 Secondary hyperparathyroidism of renal origin: Secondary | ICD-10-CM | POA: Diagnosis not present

## 2019-01-20 DIAGNOSIS — E440 Moderate protein-calorie malnutrition: Secondary | ICD-10-CM | POA: Diagnosis not present

## 2019-01-20 DIAGNOSIS — Z79899 Other long term (current) drug therapy: Secondary | ICD-10-CM | POA: Diagnosis not present

## 2019-01-20 DIAGNOSIS — R82998 Other abnormal findings in urine: Secondary | ICD-10-CM | POA: Diagnosis not present

## 2019-01-20 DIAGNOSIS — N2581 Secondary hyperparathyroidism of renal origin: Secondary | ICD-10-CM | POA: Diagnosis not present

## 2019-01-20 DIAGNOSIS — R17 Unspecified jaundice: Secondary | ICD-10-CM | POA: Diagnosis not present

## 2019-01-20 DIAGNOSIS — D509 Iron deficiency anemia, unspecified: Secondary | ICD-10-CM | POA: Diagnosis not present

## 2019-01-20 DIAGNOSIS — N186 End stage renal disease: Secondary | ICD-10-CM | POA: Diagnosis not present

## 2019-01-20 DIAGNOSIS — Z23 Encounter for immunization: Secondary | ICD-10-CM | POA: Diagnosis not present

## 2019-01-20 DIAGNOSIS — D631 Anemia in chronic kidney disease: Secondary | ICD-10-CM | POA: Diagnosis not present

## 2019-01-21 DIAGNOSIS — Z79899 Other long term (current) drug therapy: Secondary | ICD-10-CM | POA: Diagnosis not present

## 2019-01-21 DIAGNOSIS — D509 Iron deficiency anemia, unspecified: Secondary | ICD-10-CM | POA: Diagnosis not present

## 2019-01-21 DIAGNOSIS — D631 Anemia in chronic kidney disease: Secondary | ICD-10-CM | POA: Diagnosis not present

## 2019-01-21 DIAGNOSIS — Z23 Encounter for immunization: Secondary | ICD-10-CM | POA: Diagnosis not present

## 2019-01-21 DIAGNOSIS — R17 Unspecified jaundice: Secondary | ICD-10-CM | POA: Diagnosis not present

## 2019-01-21 DIAGNOSIS — E440 Moderate protein-calorie malnutrition: Secondary | ICD-10-CM | POA: Diagnosis not present

## 2019-01-21 DIAGNOSIS — N186 End stage renal disease: Secondary | ICD-10-CM | POA: Diagnosis not present

## 2019-01-21 DIAGNOSIS — R82998 Other abnormal findings in urine: Secondary | ICD-10-CM | POA: Diagnosis not present

## 2019-01-21 DIAGNOSIS — N2581 Secondary hyperparathyroidism of renal origin: Secondary | ICD-10-CM | POA: Diagnosis not present

## 2019-01-22 DIAGNOSIS — D509 Iron deficiency anemia, unspecified: Secondary | ICD-10-CM | POA: Diagnosis not present

## 2019-01-22 DIAGNOSIS — Z23 Encounter for immunization: Secondary | ICD-10-CM | POA: Diagnosis not present

## 2019-01-22 DIAGNOSIS — N186 End stage renal disease: Secondary | ICD-10-CM | POA: Diagnosis not present

## 2019-01-22 DIAGNOSIS — Z79899 Other long term (current) drug therapy: Secondary | ICD-10-CM | POA: Diagnosis not present

## 2019-01-22 DIAGNOSIS — D631 Anemia in chronic kidney disease: Secondary | ICD-10-CM | POA: Diagnosis not present

## 2019-01-22 DIAGNOSIS — R17 Unspecified jaundice: Secondary | ICD-10-CM | POA: Diagnosis not present

## 2019-01-22 DIAGNOSIS — E440 Moderate protein-calorie malnutrition: Secondary | ICD-10-CM | POA: Diagnosis not present

## 2019-01-22 DIAGNOSIS — N2581 Secondary hyperparathyroidism of renal origin: Secondary | ICD-10-CM | POA: Diagnosis not present

## 2019-01-22 DIAGNOSIS — R82998 Other abnormal findings in urine: Secondary | ICD-10-CM | POA: Diagnosis not present

## 2019-01-23 DIAGNOSIS — R82998 Other abnormal findings in urine: Secondary | ICD-10-CM | POA: Diagnosis not present

## 2019-01-23 DIAGNOSIS — E440 Moderate protein-calorie malnutrition: Secondary | ICD-10-CM | POA: Diagnosis not present

## 2019-01-23 DIAGNOSIS — N2581 Secondary hyperparathyroidism of renal origin: Secondary | ICD-10-CM | POA: Diagnosis not present

## 2019-01-23 DIAGNOSIS — R17 Unspecified jaundice: Secondary | ICD-10-CM | POA: Diagnosis not present

## 2019-01-23 DIAGNOSIS — D631 Anemia in chronic kidney disease: Secondary | ICD-10-CM | POA: Diagnosis not present

## 2019-01-23 DIAGNOSIS — Z23 Encounter for immunization: Secondary | ICD-10-CM | POA: Diagnosis not present

## 2019-01-23 DIAGNOSIS — Z79899 Other long term (current) drug therapy: Secondary | ICD-10-CM | POA: Diagnosis not present

## 2019-01-23 DIAGNOSIS — D509 Iron deficiency anemia, unspecified: Secondary | ICD-10-CM | POA: Diagnosis not present

## 2019-01-23 DIAGNOSIS — N186 End stage renal disease: Secondary | ICD-10-CM | POA: Diagnosis not present

## 2019-01-24 ENCOUNTER — Ambulatory Visit (HOSPITAL_COMMUNITY): Payer: Medicare Other

## 2019-01-24 DIAGNOSIS — N186 End stage renal disease: Secondary | ICD-10-CM | POA: Diagnosis not present

## 2019-01-24 DIAGNOSIS — N2581 Secondary hyperparathyroidism of renal origin: Secondary | ICD-10-CM | POA: Diagnosis not present

## 2019-01-24 DIAGNOSIS — D509 Iron deficiency anemia, unspecified: Secondary | ICD-10-CM | POA: Diagnosis not present

## 2019-01-24 DIAGNOSIS — D631 Anemia in chronic kidney disease: Secondary | ICD-10-CM | POA: Diagnosis not present

## 2019-01-24 DIAGNOSIS — R82998 Other abnormal findings in urine: Secondary | ICD-10-CM | POA: Diagnosis not present

## 2019-01-24 DIAGNOSIS — E440 Moderate protein-calorie malnutrition: Secondary | ICD-10-CM | POA: Diagnosis not present

## 2019-01-24 DIAGNOSIS — Z23 Encounter for immunization: Secondary | ICD-10-CM | POA: Diagnosis not present

## 2019-01-24 DIAGNOSIS — Z79899 Other long term (current) drug therapy: Secondary | ICD-10-CM | POA: Diagnosis not present

## 2019-01-24 DIAGNOSIS — R17 Unspecified jaundice: Secondary | ICD-10-CM | POA: Diagnosis not present

## 2019-01-25 DIAGNOSIS — R17 Unspecified jaundice: Secondary | ICD-10-CM | POA: Diagnosis not present

## 2019-01-25 DIAGNOSIS — R82998 Other abnormal findings in urine: Secondary | ICD-10-CM | POA: Diagnosis not present

## 2019-01-25 DIAGNOSIS — E440 Moderate protein-calorie malnutrition: Secondary | ICD-10-CM | POA: Diagnosis not present

## 2019-01-25 DIAGNOSIS — D631 Anemia in chronic kidney disease: Secondary | ICD-10-CM | POA: Diagnosis not present

## 2019-01-25 DIAGNOSIS — Z79899 Other long term (current) drug therapy: Secondary | ICD-10-CM | POA: Diagnosis not present

## 2019-01-25 DIAGNOSIS — N2581 Secondary hyperparathyroidism of renal origin: Secondary | ICD-10-CM | POA: Diagnosis not present

## 2019-01-25 DIAGNOSIS — Z23 Encounter for immunization: Secondary | ICD-10-CM | POA: Diagnosis not present

## 2019-01-25 DIAGNOSIS — N186 End stage renal disease: Secondary | ICD-10-CM | POA: Diagnosis not present

## 2019-01-25 DIAGNOSIS — D509 Iron deficiency anemia, unspecified: Secondary | ICD-10-CM | POA: Diagnosis not present

## 2019-01-26 DIAGNOSIS — N2581 Secondary hyperparathyroidism of renal origin: Secondary | ICD-10-CM | POA: Diagnosis not present

## 2019-01-26 DIAGNOSIS — R82998 Other abnormal findings in urine: Secondary | ICD-10-CM | POA: Diagnosis not present

## 2019-01-26 DIAGNOSIS — Z79899 Other long term (current) drug therapy: Secondary | ICD-10-CM | POA: Diagnosis not present

## 2019-01-26 DIAGNOSIS — R17 Unspecified jaundice: Secondary | ICD-10-CM | POA: Diagnosis not present

## 2019-01-26 DIAGNOSIS — N186 End stage renal disease: Secondary | ICD-10-CM | POA: Diagnosis not present

## 2019-01-26 DIAGNOSIS — Z23 Encounter for immunization: Secondary | ICD-10-CM | POA: Diagnosis not present

## 2019-01-26 DIAGNOSIS — E440 Moderate protein-calorie malnutrition: Secondary | ICD-10-CM | POA: Diagnosis not present

## 2019-01-26 DIAGNOSIS — D509 Iron deficiency anemia, unspecified: Secondary | ICD-10-CM | POA: Diagnosis not present

## 2019-01-26 DIAGNOSIS — D631 Anemia in chronic kidney disease: Secondary | ICD-10-CM | POA: Diagnosis not present

## 2019-01-27 DIAGNOSIS — R17 Unspecified jaundice: Secondary | ICD-10-CM | POA: Diagnosis not present

## 2019-01-27 DIAGNOSIS — D631 Anemia in chronic kidney disease: Secondary | ICD-10-CM | POA: Diagnosis not present

## 2019-01-27 DIAGNOSIS — E440 Moderate protein-calorie malnutrition: Secondary | ICD-10-CM | POA: Diagnosis not present

## 2019-01-27 DIAGNOSIS — R82998 Other abnormal findings in urine: Secondary | ICD-10-CM | POA: Diagnosis not present

## 2019-01-27 DIAGNOSIS — N186 End stage renal disease: Secondary | ICD-10-CM | POA: Diagnosis not present

## 2019-01-27 DIAGNOSIS — D509 Iron deficiency anemia, unspecified: Secondary | ICD-10-CM | POA: Diagnosis not present

## 2019-01-27 DIAGNOSIS — Z23 Encounter for immunization: Secondary | ICD-10-CM | POA: Diagnosis not present

## 2019-01-27 DIAGNOSIS — N2581 Secondary hyperparathyroidism of renal origin: Secondary | ICD-10-CM | POA: Diagnosis not present

## 2019-01-27 DIAGNOSIS — Z79899 Other long term (current) drug therapy: Secondary | ICD-10-CM | POA: Diagnosis not present

## 2019-01-28 DIAGNOSIS — R82998 Other abnormal findings in urine: Secondary | ICD-10-CM | POA: Diagnosis not present

## 2019-01-28 DIAGNOSIS — N186 End stage renal disease: Secondary | ICD-10-CM | POA: Diagnosis not present

## 2019-01-28 DIAGNOSIS — Z79899 Other long term (current) drug therapy: Secondary | ICD-10-CM | POA: Diagnosis not present

## 2019-01-28 DIAGNOSIS — D509 Iron deficiency anemia, unspecified: Secondary | ICD-10-CM | POA: Diagnosis not present

## 2019-01-28 DIAGNOSIS — R17 Unspecified jaundice: Secondary | ICD-10-CM | POA: Diagnosis not present

## 2019-01-28 DIAGNOSIS — D631 Anemia in chronic kidney disease: Secondary | ICD-10-CM | POA: Diagnosis not present

## 2019-01-28 DIAGNOSIS — Z23 Encounter for immunization: Secondary | ICD-10-CM | POA: Diagnosis not present

## 2019-01-28 DIAGNOSIS — E440 Moderate protein-calorie malnutrition: Secondary | ICD-10-CM | POA: Diagnosis not present

## 2019-01-28 DIAGNOSIS — N2581 Secondary hyperparathyroidism of renal origin: Secondary | ICD-10-CM | POA: Diagnosis not present

## 2019-01-29 DIAGNOSIS — D509 Iron deficiency anemia, unspecified: Secondary | ICD-10-CM | POA: Diagnosis not present

## 2019-01-29 DIAGNOSIS — Z79899 Other long term (current) drug therapy: Secondary | ICD-10-CM | POA: Diagnosis not present

## 2019-01-29 DIAGNOSIS — Z23 Encounter for immunization: Secondary | ICD-10-CM | POA: Diagnosis not present

## 2019-01-29 DIAGNOSIS — N186 End stage renal disease: Secondary | ICD-10-CM | POA: Diagnosis not present

## 2019-01-29 DIAGNOSIS — R17 Unspecified jaundice: Secondary | ICD-10-CM | POA: Diagnosis not present

## 2019-01-29 DIAGNOSIS — D631 Anemia in chronic kidney disease: Secondary | ICD-10-CM | POA: Diagnosis not present

## 2019-01-29 DIAGNOSIS — N2581 Secondary hyperparathyroidism of renal origin: Secondary | ICD-10-CM | POA: Diagnosis not present

## 2019-01-29 DIAGNOSIS — R82998 Other abnormal findings in urine: Secondary | ICD-10-CM | POA: Diagnosis not present

## 2019-01-29 DIAGNOSIS — E440 Moderate protein-calorie malnutrition: Secondary | ICD-10-CM | POA: Diagnosis not present

## 2019-01-30 DIAGNOSIS — E440 Moderate protein-calorie malnutrition: Secondary | ICD-10-CM | POA: Diagnosis not present

## 2019-01-30 DIAGNOSIS — R17 Unspecified jaundice: Secondary | ICD-10-CM | POA: Diagnosis not present

## 2019-01-30 DIAGNOSIS — Z23 Encounter for immunization: Secondary | ICD-10-CM | POA: Diagnosis not present

## 2019-01-30 DIAGNOSIS — R82998 Other abnormal findings in urine: Secondary | ICD-10-CM | POA: Diagnosis not present

## 2019-01-30 DIAGNOSIS — Z79899 Other long term (current) drug therapy: Secondary | ICD-10-CM | POA: Diagnosis not present

## 2019-01-30 DIAGNOSIS — D509 Iron deficiency anemia, unspecified: Secondary | ICD-10-CM | POA: Diagnosis not present

## 2019-01-30 DIAGNOSIS — D631 Anemia in chronic kidney disease: Secondary | ICD-10-CM | POA: Diagnosis not present

## 2019-01-30 DIAGNOSIS — N2581 Secondary hyperparathyroidism of renal origin: Secondary | ICD-10-CM | POA: Diagnosis not present

## 2019-01-30 DIAGNOSIS — N186 End stage renal disease: Secondary | ICD-10-CM | POA: Diagnosis not present

## 2019-01-31 ENCOUNTER — Other Ambulatory Visit: Payer: Self-pay | Admitting: Internal Medicine

## 2019-01-31 DIAGNOSIS — E440 Moderate protein-calorie malnutrition: Secondary | ICD-10-CM | POA: Diagnosis not present

## 2019-01-31 DIAGNOSIS — Z1231 Encounter for screening mammogram for malignant neoplasm of breast: Secondary | ICD-10-CM

## 2019-01-31 DIAGNOSIS — Z79899 Other long term (current) drug therapy: Secondary | ICD-10-CM | POA: Diagnosis not present

## 2019-01-31 DIAGNOSIS — D509 Iron deficiency anemia, unspecified: Secondary | ICD-10-CM | POA: Diagnosis not present

## 2019-01-31 DIAGNOSIS — R82998 Other abnormal findings in urine: Secondary | ICD-10-CM | POA: Diagnosis not present

## 2019-01-31 DIAGNOSIS — Z23 Encounter for immunization: Secondary | ICD-10-CM | POA: Diagnosis not present

## 2019-01-31 DIAGNOSIS — R17 Unspecified jaundice: Secondary | ICD-10-CM | POA: Diagnosis not present

## 2019-01-31 DIAGNOSIS — N186 End stage renal disease: Secondary | ICD-10-CM | POA: Diagnosis not present

## 2019-01-31 DIAGNOSIS — D631 Anemia in chronic kidney disease: Secondary | ICD-10-CM | POA: Diagnosis not present

## 2019-01-31 DIAGNOSIS — N2581 Secondary hyperparathyroidism of renal origin: Secondary | ICD-10-CM | POA: Diagnosis not present

## 2019-02-01 DIAGNOSIS — Z23 Encounter for immunization: Secondary | ICD-10-CM | POA: Diagnosis not present

## 2019-02-01 DIAGNOSIS — R17 Unspecified jaundice: Secondary | ICD-10-CM | POA: Diagnosis not present

## 2019-02-01 DIAGNOSIS — N2581 Secondary hyperparathyroidism of renal origin: Secondary | ICD-10-CM | POA: Diagnosis not present

## 2019-02-01 DIAGNOSIS — E440 Moderate protein-calorie malnutrition: Secondary | ICD-10-CM | POA: Diagnosis not present

## 2019-02-01 DIAGNOSIS — Z79899 Other long term (current) drug therapy: Secondary | ICD-10-CM | POA: Diagnosis not present

## 2019-02-01 DIAGNOSIS — N186 End stage renal disease: Secondary | ICD-10-CM | POA: Diagnosis not present

## 2019-02-01 DIAGNOSIS — D631 Anemia in chronic kidney disease: Secondary | ICD-10-CM | POA: Diagnosis not present

## 2019-02-01 DIAGNOSIS — R82998 Other abnormal findings in urine: Secondary | ICD-10-CM | POA: Diagnosis not present

## 2019-02-01 DIAGNOSIS — D509 Iron deficiency anemia, unspecified: Secondary | ICD-10-CM | POA: Diagnosis not present

## 2019-02-02 DIAGNOSIS — N2581 Secondary hyperparathyroidism of renal origin: Secondary | ICD-10-CM | POA: Diagnosis not present

## 2019-02-02 DIAGNOSIS — R17 Unspecified jaundice: Secondary | ICD-10-CM | POA: Diagnosis not present

## 2019-02-02 DIAGNOSIS — D509 Iron deficiency anemia, unspecified: Secondary | ICD-10-CM | POA: Diagnosis not present

## 2019-02-02 DIAGNOSIS — Z79899 Other long term (current) drug therapy: Secondary | ICD-10-CM | POA: Diagnosis not present

## 2019-02-02 DIAGNOSIS — R82998 Other abnormal findings in urine: Secondary | ICD-10-CM | POA: Diagnosis not present

## 2019-02-02 DIAGNOSIS — N186 End stage renal disease: Secondary | ICD-10-CM | POA: Diagnosis not present

## 2019-02-02 DIAGNOSIS — Z23 Encounter for immunization: Secondary | ICD-10-CM | POA: Diagnosis not present

## 2019-02-02 DIAGNOSIS — E440 Moderate protein-calorie malnutrition: Secondary | ICD-10-CM | POA: Diagnosis not present

## 2019-02-02 DIAGNOSIS — D631 Anemia in chronic kidney disease: Secondary | ICD-10-CM | POA: Diagnosis not present

## 2019-02-03 DIAGNOSIS — N186 End stage renal disease: Secondary | ICD-10-CM | POA: Diagnosis not present

## 2019-02-03 DIAGNOSIS — Z23 Encounter for immunization: Secondary | ICD-10-CM | POA: Diagnosis not present

## 2019-02-03 DIAGNOSIS — R17 Unspecified jaundice: Secondary | ICD-10-CM | POA: Diagnosis not present

## 2019-02-03 DIAGNOSIS — N2581 Secondary hyperparathyroidism of renal origin: Secondary | ICD-10-CM | POA: Diagnosis not present

## 2019-02-03 DIAGNOSIS — D631 Anemia in chronic kidney disease: Secondary | ICD-10-CM | POA: Diagnosis not present

## 2019-02-03 DIAGNOSIS — Z79899 Other long term (current) drug therapy: Secondary | ICD-10-CM | POA: Diagnosis not present

## 2019-02-03 DIAGNOSIS — E440 Moderate protein-calorie malnutrition: Secondary | ICD-10-CM | POA: Diagnosis not present

## 2019-02-03 DIAGNOSIS — D509 Iron deficiency anemia, unspecified: Secondary | ICD-10-CM | POA: Diagnosis not present

## 2019-02-03 DIAGNOSIS — R82998 Other abnormal findings in urine: Secondary | ICD-10-CM | POA: Diagnosis not present

## 2019-02-04 DIAGNOSIS — R82998 Other abnormal findings in urine: Secondary | ICD-10-CM | POA: Diagnosis not present

## 2019-02-04 DIAGNOSIS — D509 Iron deficiency anemia, unspecified: Secondary | ICD-10-CM | POA: Diagnosis not present

## 2019-02-04 DIAGNOSIS — Z23 Encounter for immunization: Secondary | ICD-10-CM | POA: Diagnosis not present

## 2019-02-04 DIAGNOSIS — Z79899 Other long term (current) drug therapy: Secondary | ICD-10-CM | POA: Diagnosis not present

## 2019-02-04 DIAGNOSIS — N186 End stage renal disease: Secondary | ICD-10-CM | POA: Diagnosis not present

## 2019-02-04 DIAGNOSIS — D631 Anemia in chronic kidney disease: Secondary | ICD-10-CM | POA: Diagnosis not present

## 2019-02-04 DIAGNOSIS — R17 Unspecified jaundice: Secondary | ICD-10-CM | POA: Diagnosis not present

## 2019-02-04 DIAGNOSIS — E440 Moderate protein-calorie malnutrition: Secondary | ICD-10-CM | POA: Diagnosis not present

## 2019-02-04 DIAGNOSIS — N2581 Secondary hyperparathyroidism of renal origin: Secondary | ICD-10-CM | POA: Diagnosis not present

## 2019-02-05 DIAGNOSIS — N186 End stage renal disease: Secondary | ICD-10-CM | POA: Diagnosis not present

## 2019-02-05 DIAGNOSIS — E440 Moderate protein-calorie malnutrition: Secondary | ICD-10-CM | POA: Diagnosis not present

## 2019-02-05 DIAGNOSIS — Z23 Encounter for immunization: Secondary | ICD-10-CM | POA: Diagnosis not present

## 2019-02-05 DIAGNOSIS — D509 Iron deficiency anemia, unspecified: Secondary | ICD-10-CM | POA: Diagnosis not present

## 2019-02-05 DIAGNOSIS — D631 Anemia in chronic kidney disease: Secondary | ICD-10-CM | POA: Diagnosis not present

## 2019-02-05 DIAGNOSIS — Z79899 Other long term (current) drug therapy: Secondary | ICD-10-CM | POA: Diagnosis not present

## 2019-02-05 DIAGNOSIS — R17 Unspecified jaundice: Secondary | ICD-10-CM | POA: Diagnosis not present

## 2019-02-05 DIAGNOSIS — R82998 Other abnormal findings in urine: Secondary | ICD-10-CM | POA: Diagnosis not present

## 2019-02-05 DIAGNOSIS — N2581 Secondary hyperparathyroidism of renal origin: Secondary | ICD-10-CM | POA: Diagnosis not present

## 2019-02-06 DIAGNOSIS — Z23 Encounter for immunization: Secondary | ICD-10-CM | POA: Diagnosis not present

## 2019-02-06 DIAGNOSIS — E440 Moderate protein-calorie malnutrition: Secondary | ICD-10-CM | POA: Diagnosis not present

## 2019-02-06 DIAGNOSIS — Z79899 Other long term (current) drug therapy: Secondary | ICD-10-CM | POA: Diagnosis not present

## 2019-02-06 DIAGNOSIS — N2581 Secondary hyperparathyroidism of renal origin: Secondary | ICD-10-CM | POA: Diagnosis not present

## 2019-02-06 DIAGNOSIS — D509 Iron deficiency anemia, unspecified: Secondary | ICD-10-CM | POA: Diagnosis not present

## 2019-02-06 DIAGNOSIS — N186 End stage renal disease: Secondary | ICD-10-CM | POA: Diagnosis not present

## 2019-02-06 DIAGNOSIS — R82998 Other abnormal findings in urine: Secondary | ICD-10-CM | POA: Diagnosis not present

## 2019-02-06 DIAGNOSIS — D631 Anemia in chronic kidney disease: Secondary | ICD-10-CM | POA: Diagnosis not present

## 2019-02-06 DIAGNOSIS — R17 Unspecified jaundice: Secondary | ICD-10-CM | POA: Diagnosis not present

## 2019-02-07 ENCOUNTER — Encounter: Payer: Self-pay | Admitting: Physician Assistant

## 2019-02-07 DIAGNOSIS — R82998 Other abnormal findings in urine: Secondary | ICD-10-CM | POA: Diagnosis not present

## 2019-02-07 DIAGNOSIS — E440 Moderate protein-calorie malnutrition: Secondary | ICD-10-CM | POA: Diagnosis not present

## 2019-02-07 DIAGNOSIS — D509 Iron deficiency anemia, unspecified: Secondary | ICD-10-CM | POA: Diagnosis not present

## 2019-02-07 DIAGNOSIS — D631 Anemia in chronic kidney disease: Secondary | ICD-10-CM | POA: Diagnosis not present

## 2019-02-07 DIAGNOSIS — Z23 Encounter for immunization: Secondary | ICD-10-CM | POA: Diagnosis not present

## 2019-02-07 DIAGNOSIS — R17 Unspecified jaundice: Secondary | ICD-10-CM | POA: Diagnosis not present

## 2019-02-07 DIAGNOSIS — Z79899 Other long term (current) drug therapy: Secondary | ICD-10-CM | POA: Diagnosis not present

## 2019-02-07 DIAGNOSIS — N186 End stage renal disease: Secondary | ICD-10-CM | POA: Diagnosis not present

## 2019-02-07 DIAGNOSIS — N2581 Secondary hyperparathyroidism of renal origin: Secondary | ICD-10-CM | POA: Diagnosis not present

## 2019-02-08 DIAGNOSIS — R17 Unspecified jaundice: Secondary | ICD-10-CM | POA: Diagnosis not present

## 2019-02-08 DIAGNOSIS — D631 Anemia in chronic kidney disease: Secondary | ICD-10-CM | POA: Diagnosis not present

## 2019-02-08 DIAGNOSIS — Z79899 Other long term (current) drug therapy: Secondary | ICD-10-CM | POA: Diagnosis not present

## 2019-02-08 DIAGNOSIS — N2581 Secondary hyperparathyroidism of renal origin: Secondary | ICD-10-CM | POA: Diagnosis not present

## 2019-02-08 DIAGNOSIS — Z23 Encounter for immunization: Secondary | ICD-10-CM | POA: Diagnosis not present

## 2019-02-08 DIAGNOSIS — D509 Iron deficiency anemia, unspecified: Secondary | ICD-10-CM | POA: Diagnosis not present

## 2019-02-08 DIAGNOSIS — N186 End stage renal disease: Secondary | ICD-10-CM | POA: Diagnosis not present

## 2019-02-08 DIAGNOSIS — E440 Moderate protein-calorie malnutrition: Secondary | ICD-10-CM | POA: Diagnosis not present

## 2019-02-08 DIAGNOSIS — R82998 Other abnormal findings in urine: Secondary | ICD-10-CM | POA: Diagnosis not present

## 2019-02-09 DIAGNOSIS — N186 End stage renal disease: Secondary | ICD-10-CM | POA: Diagnosis not present

## 2019-02-09 DIAGNOSIS — D631 Anemia in chronic kidney disease: Secondary | ICD-10-CM | POA: Diagnosis not present

## 2019-02-09 DIAGNOSIS — E440 Moderate protein-calorie malnutrition: Secondary | ICD-10-CM | POA: Diagnosis not present

## 2019-02-09 DIAGNOSIS — Z79899 Other long term (current) drug therapy: Secondary | ICD-10-CM | POA: Diagnosis not present

## 2019-02-09 DIAGNOSIS — N2581 Secondary hyperparathyroidism of renal origin: Secondary | ICD-10-CM | POA: Diagnosis not present

## 2019-02-09 DIAGNOSIS — R17 Unspecified jaundice: Secondary | ICD-10-CM | POA: Diagnosis not present

## 2019-02-09 DIAGNOSIS — Z23 Encounter for immunization: Secondary | ICD-10-CM | POA: Diagnosis not present

## 2019-02-09 DIAGNOSIS — D509 Iron deficiency anemia, unspecified: Secondary | ICD-10-CM | POA: Diagnosis not present

## 2019-02-09 DIAGNOSIS — R82998 Other abnormal findings in urine: Secondary | ICD-10-CM | POA: Diagnosis not present

## 2019-02-10 DIAGNOSIS — N2581 Secondary hyperparathyroidism of renal origin: Secondary | ICD-10-CM | POA: Diagnosis not present

## 2019-02-10 DIAGNOSIS — R17 Unspecified jaundice: Secondary | ICD-10-CM | POA: Diagnosis not present

## 2019-02-10 DIAGNOSIS — D509 Iron deficiency anemia, unspecified: Secondary | ICD-10-CM | POA: Diagnosis not present

## 2019-02-10 DIAGNOSIS — D631 Anemia in chronic kidney disease: Secondary | ICD-10-CM | POA: Diagnosis not present

## 2019-02-10 DIAGNOSIS — N186 End stage renal disease: Secondary | ICD-10-CM | POA: Diagnosis not present

## 2019-02-10 DIAGNOSIS — Z23 Encounter for immunization: Secondary | ICD-10-CM | POA: Diagnosis not present

## 2019-02-10 DIAGNOSIS — R82998 Other abnormal findings in urine: Secondary | ICD-10-CM | POA: Diagnosis not present

## 2019-02-10 DIAGNOSIS — E440 Moderate protein-calorie malnutrition: Secondary | ICD-10-CM | POA: Diagnosis not present

## 2019-02-10 DIAGNOSIS — Z79899 Other long term (current) drug therapy: Secondary | ICD-10-CM | POA: Diagnosis not present

## 2019-02-11 ENCOUNTER — Ambulatory Visit: Payer: Medicare Other | Admitting: Neurology

## 2019-02-11 DIAGNOSIS — R17 Unspecified jaundice: Secondary | ICD-10-CM | POA: Diagnosis not present

## 2019-02-11 DIAGNOSIS — R82998 Other abnormal findings in urine: Secondary | ICD-10-CM | POA: Diagnosis not present

## 2019-02-11 DIAGNOSIS — E440 Moderate protein-calorie malnutrition: Secondary | ICD-10-CM | POA: Diagnosis not present

## 2019-02-11 DIAGNOSIS — D631 Anemia in chronic kidney disease: Secondary | ICD-10-CM | POA: Diagnosis not present

## 2019-02-11 DIAGNOSIS — N186 End stage renal disease: Secondary | ICD-10-CM | POA: Diagnosis not present

## 2019-02-11 DIAGNOSIS — N2581 Secondary hyperparathyroidism of renal origin: Secondary | ICD-10-CM | POA: Diagnosis not present

## 2019-02-11 DIAGNOSIS — Z23 Encounter for immunization: Secondary | ICD-10-CM | POA: Diagnosis not present

## 2019-02-11 DIAGNOSIS — D509 Iron deficiency anemia, unspecified: Secondary | ICD-10-CM | POA: Diagnosis not present

## 2019-02-11 DIAGNOSIS — Z79899 Other long term (current) drug therapy: Secondary | ICD-10-CM | POA: Diagnosis not present

## 2019-02-12 DIAGNOSIS — R82998 Other abnormal findings in urine: Secondary | ICD-10-CM | POA: Diagnosis not present

## 2019-02-12 DIAGNOSIS — D509 Iron deficiency anemia, unspecified: Secondary | ICD-10-CM | POA: Diagnosis not present

## 2019-02-12 DIAGNOSIS — R17 Unspecified jaundice: Secondary | ICD-10-CM | POA: Diagnosis not present

## 2019-02-12 DIAGNOSIS — N186 End stage renal disease: Secondary | ICD-10-CM | POA: Diagnosis not present

## 2019-02-12 DIAGNOSIS — Z79899 Other long term (current) drug therapy: Secondary | ICD-10-CM | POA: Diagnosis not present

## 2019-02-12 DIAGNOSIS — Z23 Encounter for immunization: Secondary | ICD-10-CM | POA: Diagnosis not present

## 2019-02-12 DIAGNOSIS — D631 Anemia in chronic kidney disease: Secondary | ICD-10-CM | POA: Diagnosis not present

## 2019-02-12 DIAGNOSIS — N2581 Secondary hyperparathyroidism of renal origin: Secondary | ICD-10-CM | POA: Diagnosis not present

## 2019-02-12 DIAGNOSIS — E440 Moderate protein-calorie malnutrition: Secondary | ICD-10-CM | POA: Diagnosis not present

## 2019-02-13 DIAGNOSIS — R82998 Other abnormal findings in urine: Secondary | ICD-10-CM | POA: Diagnosis not present

## 2019-02-13 DIAGNOSIS — N2581 Secondary hyperparathyroidism of renal origin: Secondary | ICD-10-CM | POA: Diagnosis not present

## 2019-02-13 DIAGNOSIS — D631 Anemia in chronic kidney disease: Secondary | ICD-10-CM | POA: Diagnosis not present

## 2019-02-13 DIAGNOSIS — R17 Unspecified jaundice: Secondary | ICD-10-CM | POA: Diagnosis not present

## 2019-02-13 DIAGNOSIS — Z79899 Other long term (current) drug therapy: Secondary | ICD-10-CM | POA: Diagnosis not present

## 2019-02-13 DIAGNOSIS — D509 Iron deficiency anemia, unspecified: Secondary | ICD-10-CM | POA: Diagnosis not present

## 2019-02-13 DIAGNOSIS — Z23 Encounter for immunization: Secondary | ICD-10-CM | POA: Diagnosis not present

## 2019-02-13 DIAGNOSIS — N186 End stage renal disease: Secondary | ICD-10-CM | POA: Diagnosis not present

## 2019-02-13 DIAGNOSIS — E440 Moderate protein-calorie malnutrition: Secondary | ICD-10-CM | POA: Diagnosis not present

## 2019-02-14 ENCOUNTER — Ambulatory Visit (HOSPITAL_COMMUNITY): Payer: Medicare Other

## 2019-02-14 DIAGNOSIS — N2581 Secondary hyperparathyroidism of renal origin: Secondary | ICD-10-CM | POA: Diagnosis not present

## 2019-02-14 DIAGNOSIS — E440 Moderate protein-calorie malnutrition: Secondary | ICD-10-CM | POA: Diagnosis not present

## 2019-02-14 DIAGNOSIS — Z23 Encounter for immunization: Secondary | ICD-10-CM | POA: Diagnosis not present

## 2019-02-14 DIAGNOSIS — Z79899 Other long term (current) drug therapy: Secondary | ICD-10-CM | POA: Diagnosis not present

## 2019-02-14 DIAGNOSIS — D631 Anemia in chronic kidney disease: Secondary | ICD-10-CM | POA: Diagnosis not present

## 2019-02-14 DIAGNOSIS — R17 Unspecified jaundice: Secondary | ICD-10-CM | POA: Diagnosis not present

## 2019-02-14 DIAGNOSIS — N186 End stage renal disease: Secondary | ICD-10-CM | POA: Diagnosis not present

## 2019-02-14 DIAGNOSIS — R82998 Other abnormal findings in urine: Secondary | ICD-10-CM | POA: Diagnosis not present

## 2019-02-14 DIAGNOSIS — D509 Iron deficiency anemia, unspecified: Secondary | ICD-10-CM | POA: Diagnosis not present

## 2019-02-15 DIAGNOSIS — Z23 Encounter for immunization: Secondary | ICD-10-CM | POA: Diagnosis not present

## 2019-02-15 DIAGNOSIS — D631 Anemia in chronic kidney disease: Secondary | ICD-10-CM | POA: Diagnosis not present

## 2019-02-15 DIAGNOSIS — R17 Unspecified jaundice: Secondary | ICD-10-CM | POA: Diagnosis not present

## 2019-02-15 DIAGNOSIS — R82998 Other abnormal findings in urine: Secondary | ICD-10-CM | POA: Diagnosis not present

## 2019-02-15 DIAGNOSIS — Z79899 Other long term (current) drug therapy: Secondary | ICD-10-CM | POA: Diagnosis not present

## 2019-02-15 DIAGNOSIS — D509 Iron deficiency anemia, unspecified: Secondary | ICD-10-CM | POA: Diagnosis not present

## 2019-02-15 DIAGNOSIS — N186 End stage renal disease: Secondary | ICD-10-CM | POA: Diagnosis not present

## 2019-02-15 DIAGNOSIS — N2581 Secondary hyperparathyroidism of renal origin: Secondary | ICD-10-CM | POA: Diagnosis not present

## 2019-02-15 DIAGNOSIS — E440 Moderate protein-calorie malnutrition: Secondary | ICD-10-CM | POA: Diagnosis not present

## 2019-02-16 DIAGNOSIS — N2581 Secondary hyperparathyroidism of renal origin: Secondary | ICD-10-CM | POA: Diagnosis not present

## 2019-02-16 DIAGNOSIS — E440 Moderate protein-calorie malnutrition: Secondary | ICD-10-CM | POA: Diagnosis not present

## 2019-02-16 DIAGNOSIS — D509 Iron deficiency anemia, unspecified: Secondary | ICD-10-CM | POA: Diagnosis not present

## 2019-02-16 DIAGNOSIS — Z79899 Other long term (current) drug therapy: Secondary | ICD-10-CM | POA: Diagnosis not present

## 2019-02-16 DIAGNOSIS — D631 Anemia in chronic kidney disease: Secondary | ICD-10-CM | POA: Diagnosis not present

## 2019-02-16 DIAGNOSIS — R17 Unspecified jaundice: Secondary | ICD-10-CM | POA: Diagnosis not present

## 2019-02-16 DIAGNOSIS — N186 End stage renal disease: Secondary | ICD-10-CM | POA: Diagnosis not present

## 2019-02-16 DIAGNOSIS — R82998 Other abnormal findings in urine: Secondary | ICD-10-CM | POA: Diagnosis not present

## 2019-02-16 DIAGNOSIS — Z23 Encounter for immunization: Secondary | ICD-10-CM | POA: Diagnosis not present

## 2019-02-17 DIAGNOSIS — R82998 Other abnormal findings in urine: Secondary | ICD-10-CM | POA: Diagnosis not present

## 2019-02-17 DIAGNOSIS — D631 Anemia in chronic kidney disease: Secondary | ICD-10-CM | POA: Diagnosis not present

## 2019-02-17 DIAGNOSIS — Z79899 Other long term (current) drug therapy: Secondary | ICD-10-CM | POA: Diagnosis not present

## 2019-02-17 DIAGNOSIS — Z23 Encounter for immunization: Secondary | ICD-10-CM | POA: Diagnosis not present

## 2019-02-17 DIAGNOSIS — N2581 Secondary hyperparathyroidism of renal origin: Secondary | ICD-10-CM | POA: Diagnosis not present

## 2019-02-17 DIAGNOSIS — D509 Iron deficiency anemia, unspecified: Secondary | ICD-10-CM | POA: Diagnosis not present

## 2019-02-17 DIAGNOSIS — N186 End stage renal disease: Secondary | ICD-10-CM | POA: Diagnosis not present

## 2019-02-17 DIAGNOSIS — R17 Unspecified jaundice: Secondary | ICD-10-CM | POA: Diagnosis not present

## 2019-02-17 DIAGNOSIS — E440 Moderate protein-calorie malnutrition: Secondary | ICD-10-CM | POA: Diagnosis not present

## 2019-02-18 DIAGNOSIS — Z992 Dependence on renal dialysis: Secondary | ICD-10-CM | POA: Diagnosis not present

## 2019-02-18 DIAGNOSIS — Z79899 Other long term (current) drug therapy: Secondary | ICD-10-CM | POA: Diagnosis not present

## 2019-02-18 DIAGNOSIS — D509 Iron deficiency anemia, unspecified: Secondary | ICD-10-CM | POA: Diagnosis not present

## 2019-02-18 DIAGNOSIS — R82998 Other abnormal findings in urine: Secondary | ICD-10-CM | POA: Diagnosis not present

## 2019-02-18 DIAGNOSIS — N2581 Secondary hyperparathyroidism of renal origin: Secondary | ICD-10-CM | POA: Diagnosis not present

## 2019-02-18 DIAGNOSIS — E440 Moderate protein-calorie malnutrition: Secondary | ICD-10-CM | POA: Diagnosis not present

## 2019-02-18 DIAGNOSIS — N186 End stage renal disease: Secondary | ICD-10-CM | POA: Diagnosis not present

## 2019-02-18 DIAGNOSIS — E1122 Type 2 diabetes mellitus with diabetic chronic kidney disease: Secondary | ICD-10-CM | POA: Diagnosis not present

## 2019-02-18 DIAGNOSIS — Z23 Encounter for immunization: Secondary | ICD-10-CM | POA: Diagnosis not present

## 2019-02-18 DIAGNOSIS — D631 Anemia in chronic kidney disease: Secondary | ICD-10-CM | POA: Diagnosis not present

## 2019-02-18 DIAGNOSIS — R17 Unspecified jaundice: Secondary | ICD-10-CM | POA: Diagnosis not present

## 2019-02-19 DIAGNOSIS — E119 Type 2 diabetes mellitus without complications: Secondary | ICD-10-CM | POA: Diagnosis not present

## 2019-02-19 DIAGNOSIS — D509 Iron deficiency anemia, unspecified: Secondary | ICD-10-CM | POA: Diagnosis not present

## 2019-02-19 DIAGNOSIS — N186 End stage renal disease: Secondary | ICD-10-CM | POA: Diagnosis not present

## 2019-02-19 DIAGNOSIS — Z23 Encounter for immunization: Secondary | ICD-10-CM | POA: Diagnosis not present

## 2019-02-19 DIAGNOSIS — D631 Anemia in chronic kidney disease: Secondary | ICD-10-CM | POA: Diagnosis not present

## 2019-02-19 DIAGNOSIS — R82998 Other abnormal findings in urine: Secondary | ICD-10-CM | POA: Diagnosis not present

## 2019-02-19 DIAGNOSIS — Z4932 Encounter for adequacy testing for peritoneal dialysis: Secondary | ICD-10-CM | POA: Diagnosis not present

## 2019-02-19 DIAGNOSIS — N2589 Other disorders resulting from impaired renal tubular function: Secondary | ICD-10-CM | POA: Diagnosis not present

## 2019-02-19 DIAGNOSIS — N2581 Secondary hyperparathyroidism of renal origin: Secondary | ICD-10-CM | POA: Diagnosis not present

## 2019-02-20 ENCOUNTER — Ambulatory Visit: Payer: Medicare Other | Admitting: Adult Health

## 2019-02-20 DIAGNOSIS — Z4932 Encounter for adequacy testing for peritoneal dialysis: Secondary | ICD-10-CM | POA: Diagnosis not present

## 2019-02-20 DIAGNOSIS — D509 Iron deficiency anemia, unspecified: Secondary | ICD-10-CM | POA: Diagnosis not present

## 2019-02-20 DIAGNOSIS — Z789 Other specified health status: Secondary | ICD-10-CM | POA: Diagnosis not present

## 2019-02-20 DIAGNOSIS — I10 Essential (primary) hypertension: Secondary | ICD-10-CM | POA: Diagnosis not present

## 2019-02-20 DIAGNOSIS — N2589 Other disorders resulting from impaired renal tubular function: Secondary | ICD-10-CM | POA: Diagnosis not present

## 2019-02-20 DIAGNOSIS — R82998 Other abnormal findings in urine: Secondary | ICD-10-CM | POA: Diagnosis not present

## 2019-02-20 DIAGNOSIS — N2581 Secondary hyperparathyroidism of renal origin: Secondary | ICD-10-CM | POA: Diagnosis not present

## 2019-02-20 DIAGNOSIS — D631 Anemia in chronic kidney disease: Secondary | ICD-10-CM | POA: Diagnosis not present

## 2019-02-20 DIAGNOSIS — E1122 Type 2 diabetes mellitus with diabetic chronic kidney disease: Secondary | ICD-10-CM

## 2019-02-20 DIAGNOSIS — Z23 Encounter for immunization: Secondary | ICD-10-CM | POA: Diagnosis not present

## 2019-02-20 DIAGNOSIS — Z992 Dependence on renal dialysis: Secondary | ICD-10-CM

## 2019-02-20 DIAGNOSIS — N186 End stage renal disease: Secondary | ICD-10-CM

## 2019-02-20 DIAGNOSIS — E782 Mixed hyperlipidemia: Secondary | ICD-10-CM | POA: Diagnosis not present

## 2019-02-20 DIAGNOSIS — E119 Type 2 diabetes mellitus without complications: Secondary | ICD-10-CM | POA: Diagnosis not present

## 2019-02-20 NOTE — Progress Notes (Signed)
Virtual Visit via Telephone Note  I connected with Eileen Perez on 02/20/19 at  by telephone and verified that I am speaking with the correct person using two identifiers.   I discussed the limitations, risks, security and privacy concerns of performing an evaluation and management service by telephone and the availability of in person appointments. I also discussed with the patient that there may be a patient responsible charge related to this service. The patient expressed understanding and agreed to proceed.   History of Present Illness:  78 y.o. female with ESRD consequent of T2DM, with htn, hyperlipidemia was identified as high risk patient and called to initiate CCM and follow up on chronic health conditions.   Her blood pressure has been controlled at home, reports consistently <120/80  She does not workout. She denies chest pain, shortness of breath, dizziness.   She is not on cholesterol medication, was prescribed crestor but never started.    Her cholesterol is at goal. She is watching diet for cholesterol. The cholesterol last visit was:   Lab Results  Component Value Date   CHOL 213 (H) 12/20/2018   HDL 50 (L) 12/20/2018   LDLCALC 120 (H) 12/20/2018   TRIG 281 (H) 12/20/2018   CHOLHDL 4.3 12/20/2018    She has been working on diet and exercise for T2DM, and denies foot ulcerations, hyperglycemia, hypoglycemia , increased appetite, nausea, polydipsia, visual disturbances and weight loss. She has glucometer and supplies to check glucose but hasn't been checking due to consistently controlled glucose over recent years and no hypoglycemic episodes. Last A1C in the office was:  Lab Results  Component Value Date   HGBA1C 6.2 (H) 08/30/2018    The patient has ESRD since 2017 on peritoneal dialysis at home, followed by Dr. Corliss Parish. She is following renal diet, checks weights daily at home which she reports are stable to her baseline.  Lab Results  Component Value Date    GFRAA 3 (L) 01/04/2019   Lab Results  Component Value Date   CREATININE 11.65 (H) 01/04/2019   Lab Results  Component Value Date   BUN 44 (H) 01/04/2019     Observations/Objective:  General : Well sounding patient in no apparent distress HEENT: no hoarseness, no cough for duration of visit Lungs: speaks in complete sentences, no audible wheezing, no apparent distress Neurological: alert, oriented x 3 Psychiatric: pleasant, judgement appropriate   Assessment and Plan:   ESRD (end stage renal disease) ([place]) Continue home peritoneal dialysis Continue renal/DASH diets Continue daily weights - call with unusual weight gain Call with concerns of access site complications  Type 2 diabetes mellitus with chronic kidney disease on chronic dialysis, without long-term current use of insulin (HCC) Recently well controlled by dialysis Education: Reviewed 'ABCs' of diabetes management (respective goals in parentheses):  A1C (<7), blood pressure (<130/80), and cholesterol (LDL <70) Eye Exam yearly and Dental Exam every 6 months. Dietary recommendations Physical Activity recommendations - Strongly advisedto start checking sugars  TRY TO ADD ON WALKING, START LOW AT 20 MINS 2-3 DAYS A WEEK  Essential hypertension Continue medication Monitor blood pressure at home; call if consistently over 130/80 Continue DASH diet.   Reminder to go to the ER if any CP, SOB, nausea, dizziness, severe HA, changes vision/speech, left arm numbness and tingling and jaw pain.  Hyperlipidemia Patient declines medication Continue low cholesterol diet and exercise.  Check lipid panel at [facility]  Enrolled in chronic care management  Comprehensive care plan:  Patient/caregiver was given  comprehensive care plan We will continue to monitor these goals every 3 months with an office visit and every month by a telephone call  Patient can contact the office any time with the phone number and get to a provider  via the answering service or they can use Mychart.   Verbal permission was received from the patient to review comprehensive care management, they understand they have the right to stop CCM services at any time.   The patient is self managing medications at home.  Medications were reviewed with the patient today as well as adherence and potential interactions.   The patient does not need home health services at this time.    Follow Up Instructions:    I discussed the assessment and treatment plan with the patient. The patient was provided an opportunity to ask questions and all were answered. The patient agreed with the plan and demonstrated an understanding of the instructions.   The patient was advised to call back or seek an in-person evaluation if the symptoms worsen or if the condition fails to improve as anticipated.  I provided 30 minutes of non-face-to-face time during this encounter.   Eileen Ribas, NP

## 2019-02-20 NOTE — Chronic Care Management (AMB) (Signed)
  Comprehensive care plan:  Patient/caregiver was given comprehensive care plan We will continue to monitor these goals every 3 months with an office visit and every month by a telephone call  Patient can contact the office any time with the phone number and get to a provider via the answering service or they can use Mychart.   Verbal permission was received from the patient to review comprehensive care management, they understand they have the right to stop CCM services at any time.   The patient is self managing medications at home.  Medications were reviewed with the patient today as well as adherence and potential interactions.   The patient does not need home health services at this time.   Goals    . Blood Pressure < 130/80     Check blood pressure daily   Continue to follow DASH diet    . HEMOGLOBIN A1C < 7.0    . Record weight daily       Future Appointments  Date Time Provider [place]  03/07/2019  8:15 AM Hayden Pedro, MD TRE-TRE None  03/14/2019  8:20 AM GI-BCG MM 3 GI-BCGMM GI-BREAST CE  03/21/2019 11:00 AM Unk Pinto, MD GAAM-GAAIM None  03/27/2019  1:30 PM MC-US 1 MC-US [facility]  05/01/2019 10:00 AM Marcial Pacas, MD GNA-GNA None  06/10/2019  9:00 AM Liane Comber, NP GAAM-GAAIM None

## 2019-02-21 DIAGNOSIS — D509 Iron deficiency anemia, unspecified: Secondary | ICD-10-CM | POA: Diagnosis not present

## 2019-02-21 DIAGNOSIS — E119 Type 2 diabetes mellitus without complications: Secondary | ICD-10-CM | POA: Diagnosis not present

## 2019-02-21 DIAGNOSIS — N186 End stage renal disease: Secondary | ICD-10-CM | POA: Diagnosis not present

## 2019-02-21 DIAGNOSIS — N2581 Secondary hyperparathyroidism of renal origin: Secondary | ICD-10-CM | POA: Diagnosis not present

## 2019-02-21 DIAGNOSIS — Z23 Encounter for immunization: Secondary | ICD-10-CM | POA: Diagnosis not present

## 2019-02-21 DIAGNOSIS — R82998 Other abnormal findings in urine: Secondary | ICD-10-CM | POA: Diagnosis not present

## 2019-02-21 DIAGNOSIS — D631 Anemia in chronic kidney disease: Secondary | ICD-10-CM | POA: Diagnosis not present

## 2019-02-21 DIAGNOSIS — Z4932 Encounter for adequacy testing for peritoneal dialysis: Secondary | ICD-10-CM | POA: Diagnosis not present

## 2019-02-21 DIAGNOSIS — N2589 Other disorders resulting from impaired renal tubular function: Secondary | ICD-10-CM | POA: Diagnosis not present

## 2019-02-22 DIAGNOSIS — D509 Iron deficiency anemia, unspecified: Secondary | ICD-10-CM | POA: Diagnosis not present

## 2019-02-22 DIAGNOSIS — N2581 Secondary hyperparathyroidism of renal origin: Secondary | ICD-10-CM | POA: Diagnosis not present

## 2019-02-22 DIAGNOSIS — Z23 Encounter for immunization: Secondary | ICD-10-CM | POA: Diagnosis not present

## 2019-02-22 DIAGNOSIS — N186 End stage renal disease: Secondary | ICD-10-CM | POA: Diagnosis not present

## 2019-02-22 DIAGNOSIS — Z4932 Encounter for adequacy testing for peritoneal dialysis: Secondary | ICD-10-CM | POA: Diagnosis not present

## 2019-02-22 DIAGNOSIS — R82998 Other abnormal findings in urine: Secondary | ICD-10-CM | POA: Diagnosis not present

## 2019-02-22 DIAGNOSIS — E119 Type 2 diabetes mellitus without complications: Secondary | ICD-10-CM | POA: Diagnosis not present

## 2019-02-22 DIAGNOSIS — D631 Anemia in chronic kidney disease: Secondary | ICD-10-CM | POA: Diagnosis not present

## 2019-02-22 DIAGNOSIS — N2589 Other disorders resulting from impaired renal tubular function: Secondary | ICD-10-CM | POA: Diagnosis not present

## 2019-02-23 DIAGNOSIS — Z4932 Encounter for adequacy testing for peritoneal dialysis: Secondary | ICD-10-CM | POA: Diagnosis not present

## 2019-02-23 DIAGNOSIS — R82998 Other abnormal findings in urine: Secondary | ICD-10-CM | POA: Diagnosis not present

## 2019-02-23 DIAGNOSIS — Z23 Encounter for immunization: Secondary | ICD-10-CM | POA: Diagnosis not present

## 2019-02-23 DIAGNOSIS — N2589 Other disorders resulting from impaired renal tubular function: Secondary | ICD-10-CM | POA: Diagnosis not present

## 2019-02-23 DIAGNOSIS — N2581 Secondary hyperparathyroidism of renal origin: Secondary | ICD-10-CM | POA: Diagnosis not present

## 2019-02-23 DIAGNOSIS — D631 Anemia in chronic kidney disease: Secondary | ICD-10-CM | POA: Diagnosis not present

## 2019-02-23 DIAGNOSIS — D509 Iron deficiency anemia, unspecified: Secondary | ICD-10-CM | POA: Diagnosis not present

## 2019-02-23 DIAGNOSIS — N186 End stage renal disease: Secondary | ICD-10-CM | POA: Diagnosis not present

## 2019-02-23 DIAGNOSIS — E119 Type 2 diabetes mellitus without complications: Secondary | ICD-10-CM | POA: Diagnosis not present

## 2019-02-24 DIAGNOSIS — D631 Anemia in chronic kidney disease: Secondary | ICD-10-CM | POA: Diagnosis not present

## 2019-02-24 DIAGNOSIS — D509 Iron deficiency anemia, unspecified: Secondary | ICD-10-CM | POA: Diagnosis not present

## 2019-02-24 DIAGNOSIS — Z23 Encounter for immunization: Secondary | ICD-10-CM | POA: Diagnosis not present

## 2019-02-24 DIAGNOSIS — N2581 Secondary hyperparathyroidism of renal origin: Secondary | ICD-10-CM | POA: Diagnosis not present

## 2019-02-24 DIAGNOSIS — Z4932 Encounter for adequacy testing for peritoneal dialysis: Secondary | ICD-10-CM | POA: Diagnosis not present

## 2019-02-24 DIAGNOSIS — N186 End stage renal disease: Secondary | ICD-10-CM | POA: Diagnosis not present

## 2019-02-24 DIAGNOSIS — E119 Type 2 diabetes mellitus without complications: Secondary | ICD-10-CM | POA: Diagnosis not present

## 2019-02-24 DIAGNOSIS — R82998 Other abnormal findings in urine: Secondary | ICD-10-CM | POA: Diagnosis not present

## 2019-02-24 DIAGNOSIS — N2589 Other disorders resulting from impaired renal tubular function: Secondary | ICD-10-CM | POA: Diagnosis not present

## 2019-02-25 DIAGNOSIS — Z4932 Encounter for adequacy testing for peritoneal dialysis: Secondary | ICD-10-CM | POA: Diagnosis not present

## 2019-02-25 DIAGNOSIS — Z23 Encounter for immunization: Secondary | ICD-10-CM | POA: Diagnosis not present

## 2019-02-25 DIAGNOSIS — D631 Anemia in chronic kidney disease: Secondary | ICD-10-CM | POA: Diagnosis not present

## 2019-02-25 DIAGNOSIS — R82998 Other abnormal findings in urine: Secondary | ICD-10-CM | POA: Diagnosis not present

## 2019-02-25 DIAGNOSIS — D509 Iron deficiency anemia, unspecified: Secondary | ICD-10-CM | POA: Diagnosis not present

## 2019-02-25 DIAGNOSIS — N2589 Other disorders resulting from impaired renal tubular function: Secondary | ICD-10-CM | POA: Diagnosis not present

## 2019-02-25 DIAGNOSIS — E119 Type 2 diabetes mellitus without complications: Secondary | ICD-10-CM | POA: Diagnosis not present

## 2019-02-25 DIAGNOSIS — N2581 Secondary hyperparathyroidism of renal origin: Secondary | ICD-10-CM | POA: Diagnosis not present

## 2019-02-25 DIAGNOSIS — N186 End stage renal disease: Secondary | ICD-10-CM | POA: Diagnosis not present

## 2019-02-26 DIAGNOSIS — N186 End stage renal disease: Secondary | ICD-10-CM | POA: Diagnosis not present

## 2019-02-26 DIAGNOSIS — D631 Anemia in chronic kidney disease: Secondary | ICD-10-CM | POA: Diagnosis not present

## 2019-02-26 DIAGNOSIS — E119 Type 2 diabetes mellitus without complications: Secondary | ICD-10-CM | POA: Diagnosis not present

## 2019-02-26 DIAGNOSIS — N2589 Other disorders resulting from impaired renal tubular function: Secondary | ICD-10-CM | POA: Diagnosis not present

## 2019-02-26 DIAGNOSIS — R82998 Other abnormal findings in urine: Secondary | ICD-10-CM | POA: Diagnosis not present

## 2019-02-26 DIAGNOSIS — D509 Iron deficiency anemia, unspecified: Secondary | ICD-10-CM | POA: Diagnosis not present

## 2019-02-26 DIAGNOSIS — Z4932 Encounter for adequacy testing for peritoneal dialysis: Secondary | ICD-10-CM | POA: Diagnosis not present

## 2019-02-26 DIAGNOSIS — N2581 Secondary hyperparathyroidism of renal origin: Secondary | ICD-10-CM | POA: Diagnosis not present

## 2019-02-26 DIAGNOSIS — Z23 Encounter for immunization: Secondary | ICD-10-CM | POA: Diagnosis not present

## 2019-02-27 DIAGNOSIS — N2589 Other disorders resulting from impaired renal tubular function: Secondary | ICD-10-CM | POA: Diagnosis not present

## 2019-02-27 DIAGNOSIS — Z4932 Encounter for adequacy testing for peritoneal dialysis: Secondary | ICD-10-CM | POA: Diagnosis not present

## 2019-02-27 DIAGNOSIS — R82998 Other abnormal findings in urine: Secondary | ICD-10-CM | POA: Diagnosis not present

## 2019-02-27 DIAGNOSIS — D631 Anemia in chronic kidney disease: Secondary | ICD-10-CM | POA: Diagnosis not present

## 2019-02-27 DIAGNOSIS — N186 End stage renal disease: Secondary | ICD-10-CM | POA: Diagnosis not present

## 2019-02-27 DIAGNOSIS — D509 Iron deficiency anemia, unspecified: Secondary | ICD-10-CM | POA: Diagnosis not present

## 2019-02-27 DIAGNOSIS — E119 Type 2 diabetes mellitus without complications: Secondary | ICD-10-CM | POA: Diagnosis not present

## 2019-02-27 DIAGNOSIS — N2581 Secondary hyperparathyroidism of renal origin: Secondary | ICD-10-CM | POA: Diagnosis not present

## 2019-02-27 DIAGNOSIS — Z23 Encounter for immunization: Secondary | ICD-10-CM | POA: Diagnosis not present

## 2019-02-28 DIAGNOSIS — E119 Type 2 diabetes mellitus without complications: Secondary | ICD-10-CM | POA: Diagnosis not present

## 2019-02-28 DIAGNOSIS — D631 Anemia in chronic kidney disease: Secondary | ICD-10-CM | POA: Diagnosis not present

## 2019-02-28 DIAGNOSIS — R82998 Other abnormal findings in urine: Secondary | ICD-10-CM | POA: Diagnosis not present

## 2019-02-28 DIAGNOSIS — Z23 Encounter for immunization: Secondary | ICD-10-CM | POA: Diagnosis not present

## 2019-02-28 DIAGNOSIS — Z4932 Encounter for adequacy testing for peritoneal dialysis: Secondary | ICD-10-CM | POA: Diagnosis not present

## 2019-02-28 DIAGNOSIS — N2589 Other disorders resulting from impaired renal tubular function: Secondary | ICD-10-CM | POA: Diagnosis not present

## 2019-02-28 DIAGNOSIS — N2581 Secondary hyperparathyroidism of renal origin: Secondary | ICD-10-CM | POA: Diagnosis not present

## 2019-02-28 DIAGNOSIS — D509 Iron deficiency anemia, unspecified: Secondary | ICD-10-CM | POA: Diagnosis not present

## 2019-02-28 DIAGNOSIS — N186 End stage renal disease: Secondary | ICD-10-CM | POA: Diagnosis not present

## 2019-03-01 DIAGNOSIS — R82998 Other abnormal findings in urine: Secondary | ICD-10-CM | POA: Diagnosis not present

## 2019-03-01 DIAGNOSIS — N2581 Secondary hyperparathyroidism of renal origin: Secondary | ICD-10-CM | POA: Diagnosis not present

## 2019-03-01 DIAGNOSIS — D631 Anemia in chronic kidney disease: Secondary | ICD-10-CM | POA: Diagnosis not present

## 2019-03-01 DIAGNOSIS — N186 End stage renal disease: Secondary | ICD-10-CM | POA: Diagnosis not present

## 2019-03-01 DIAGNOSIS — E119 Type 2 diabetes mellitus without complications: Secondary | ICD-10-CM | POA: Diagnosis not present

## 2019-03-01 DIAGNOSIS — N2589 Other disorders resulting from impaired renal tubular function: Secondary | ICD-10-CM | POA: Diagnosis not present

## 2019-03-01 DIAGNOSIS — D509 Iron deficiency anemia, unspecified: Secondary | ICD-10-CM | POA: Diagnosis not present

## 2019-03-01 DIAGNOSIS — Z4932 Encounter for adequacy testing for peritoneal dialysis: Secondary | ICD-10-CM | POA: Diagnosis not present

## 2019-03-01 DIAGNOSIS — Z23 Encounter for immunization: Secondary | ICD-10-CM | POA: Diagnosis not present

## 2019-03-02 DIAGNOSIS — E119 Type 2 diabetes mellitus without complications: Secondary | ICD-10-CM | POA: Diagnosis not present

## 2019-03-02 DIAGNOSIS — R82998 Other abnormal findings in urine: Secondary | ICD-10-CM | POA: Diagnosis not present

## 2019-03-02 DIAGNOSIS — N186 End stage renal disease: Secondary | ICD-10-CM | POA: Diagnosis not present

## 2019-03-02 DIAGNOSIS — D509 Iron deficiency anemia, unspecified: Secondary | ICD-10-CM | POA: Diagnosis not present

## 2019-03-02 DIAGNOSIS — N2581 Secondary hyperparathyroidism of renal origin: Secondary | ICD-10-CM | POA: Diagnosis not present

## 2019-03-02 DIAGNOSIS — N2589 Other disorders resulting from impaired renal tubular function: Secondary | ICD-10-CM | POA: Diagnosis not present

## 2019-03-02 DIAGNOSIS — Z23 Encounter for immunization: Secondary | ICD-10-CM | POA: Diagnosis not present

## 2019-03-02 DIAGNOSIS — Z4932 Encounter for adequacy testing for peritoneal dialysis: Secondary | ICD-10-CM | POA: Diagnosis not present

## 2019-03-02 DIAGNOSIS — D631 Anemia in chronic kidney disease: Secondary | ICD-10-CM | POA: Diagnosis not present

## 2019-03-03 DIAGNOSIS — R82998 Other abnormal findings in urine: Secondary | ICD-10-CM | POA: Diagnosis not present

## 2019-03-03 DIAGNOSIS — N186 End stage renal disease: Secondary | ICD-10-CM | POA: Diagnosis not present

## 2019-03-03 DIAGNOSIS — E119 Type 2 diabetes mellitus without complications: Secondary | ICD-10-CM | POA: Diagnosis not present

## 2019-03-03 DIAGNOSIS — N2581 Secondary hyperparathyroidism of renal origin: Secondary | ICD-10-CM | POA: Diagnosis not present

## 2019-03-03 DIAGNOSIS — N2589 Other disorders resulting from impaired renal tubular function: Secondary | ICD-10-CM | POA: Diagnosis not present

## 2019-03-03 DIAGNOSIS — Z23 Encounter for immunization: Secondary | ICD-10-CM | POA: Diagnosis not present

## 2019-03-03 DIAGNOSIS — D509 Iron deficiency anemia, unspecified: Secondary | ICD-10-CM | POA: Diagnosis not present

## 2019-03-03 DIAGNOSIS — D631 Anemia in chronic kidney disease: Secondary | ICD-10-CM | POA: Diagnosis not present

## 2019-03-03 DIAGNOSIS — Z4932 Encounter for adequacy testing for peritoneal dialysis: Secondary | ICD-10-CM | POA: Diagnosis not present

## 2019-03-04 DIAGNOSIS — R82998 Other abnormal findings in urine: Secondary | ICD-10-CM | POA: Diagnosis not present

## 2019-03-04 DIAGNOSIS — D509 Iron deficiency anemia, unspecified: Secondary | ICD-10-CM | POA: Diagnosis not present

## 2019-03-04 DIAGNOSIS — N186 End stage renal disease: Secondary | ICD-10-CM | POA: Diagnosis not present

## 2019-03-04 DIAGNOSIS — N2581 Secondary hyperparathyroidism of renal origin: Secondary | ICD-10-CM | POA: Diagnosis not present

## 2019-03-04 DIAGNOSIS — Z4932 Encounter for adequacy testing for peritoneal dialysis: Secondary | ICD-10-CM | POA: Diagnosis not present

## 2019-03-04 DIAGNOSIS — Z23 Encounter for immunization: Secondary | ICD-10-CM | POA: Diagnosis not present

## 2019-03-04 DIAGNOSIS — D631 Anemia in chronic kidney disease: Secondary | ICD-10-CM | POA: Diagnosis not present

## 2019-03-04 DIAGNOSIS — N2589 Other disorders resulting from impaired renal tubular function: Secondary | ICD-10-CM | POA: Diagnosis not present

## 2019-03-04 DIAGNOSIS — E119 Type 2 diabetes mellitus without complications: Secondary | ICD-10-CM | POA: Diagnosis not present

## 2019-03-05 DIAGNOSIS — E119 Type 2 diabetes mellitus without complications: Secondary | ICD-10-CM | POA: Diagnosis not present

## 2019-03-05 DIAGNOSIS — N2581 Secondary hyperparathyroidism of renal origin: Secondary | ICD-10-CM | POA: Diagnosis not present

## 2019-03-05 DIAGNOSIS — R82998 Other abnormal findings in urine: Secondary | ICD-10-CM | POA: Diagnosis not present

## 2019-03-05 DIAGNOSIS — N2589 Other disorders resulting from impaired renal tubular function: Secondary | ICD-10-CM | POA: Diagnosis not present

## 2019-03-05 DIAGNOSIS — Z23 Encounter for immunization: Secondary | ICD-10-CM | POA: Diagnosis not present

## 2019-03-05 DIAGNOSIS — D631 Anemia in chronic kidney disease: Secondary | ICD-10-CM | POA: Diagnosis not present

## 2019-03-05 DIAGNOSIS — Z4932 Encounter for adequacy testing for peritoneal dialysis: Secondary | ICD-10-CM | POA: Diagnosis not present

## 2019-03-05 DIAGNOSIS — N186 End stage renal disease: Secondary | ICD-10-CM | POA: Diagnosis not present

## 2019-03-05 DIAGNOSIS — D509 Iron deficiency anemia, unspecified: Secondary | ICD-10-CM | POA: Diagnosis not present

## 2019-03-06 DIAGNOSIS — Z4932 Encounter for adequacy testing for peritoneal dialysis: Secondary | ICD-10-CM | POA: Diagnosis not present

## 2019-03-06 DIAGNOSIS — D631 Anemia in chronic kidney disease: Secondary | ICD-10-CM | POA: Diagnosis not present

## 2019-03-06 DIAGNOSIS — E119 Type 2 diabetes mellitus without complications: Secondary | ICD-10-CM | POA: Diagnosis not present

## 2019-03-06 DIAGNOSIS — N186 End stage renal disease: Secondary | ICD-10-CM | POA: Diagnosis not present

## 2019-03-06 DIAGNOSIS — R82998 Other abnormal findings in urine: Secondary | ICD-10-CM | POA: Diagnosis not present

## 2019-03-06 DIAGNOSIS — Z23 Encounter for immunization: Secondary | ICD-10-CM | POA: Diagnosis not present

## 2019-03-06 DIAGNOSIS — N2589 Other disorders resulting from impaired renal tubular function: Secondary | ICD-10-CM | POA: Diagnosis not present

## 2019-03-06 DIAGNOSIS — N2581 Secondary hyperparathyroidism of renal origin: Secondary | ICD-10-CM | POA: Diagnosis not present

## 2019-03-06 DIAGNOSIS — D509 Iron deficiency anemia, unspecified: Secondary | ICD-10-CM | POA: Diagnosis not present

## 2019-03-07 ENCOUNTER — Encounter (INDEPENDENT_AMBULATORY_CARE_PROVIDER_SITE_OTHER): Payer: Medicare Other | Admitting: Ophthalmology

## 2019-03-07 DIAGNOSIS — Z23 Encounter for immunization: Secondary | ICD-10-CM | POA: Diagnosis not present

## 2019-03-07 DIAGNOSIS — N186 End stage renal disease: Secondary | ICD-10-CM | POA: Diagnosis not present

## 2019-03-07 DIAGNOSIS — N2581 Secondary hyperparathyroidism of renal origin: Secondary | ICD-10-CM | POA: Diagnosis not present

## 2019-03-07 DIAGNOSIS — E119 Type 2 diabetes mellitus without complications: Secondary | ICD-10-CM | POA: Diagnosis not present

## 2019-03-07 DIAGNOSIS — Z4932 Encounter for adequacy testing for peritoneal dialysis: Secondary | ICD-10-CM | POA: Diagnosis not present

## 2019-03-07 DIAGNOSIS — D631 Anemia in chronic kidney disease: Secondary | ICD-10-CM | POA: Diagnosis not present

## 2019-03-07 DIAGNOSIS — N2589 Other disorders resulting from impaired renal tubular function: Secondary | ICD-10-CM | POA: Diagnosis not present

## 2019-03-07 DIAGNOSIS — R82998 Other abnormal findings in urine: Secondary | ICD-10-CM | POA: Diagnosis not present

## 2019-03-07 DIAGNOSIS — D509 Iron deficiency anemia, unspecified: Secondary | ICD-10-CM | POA: Diagnosis not present

## 2019-03-08 DIAGNOSIS — R82998 Other abnormal findings in urine: Secondary | ICD-10-CM | POA: Diagnosis not present

## 2019-03-08 DIAGNOSIS — Z23 Encounter for immunization: Secondary | ICD-10-CM | POA: Diagnosis not present

## 2019-03-08 DIAGNOSIS — D631 Anemia in chronic kidney disease: Secondary | ICD-10-CM | POA: Diagnosis not present

## 2019-03-08 DIAGNOSIS — N186 End stage renal disease: Secondary | ICD-10-CM | POA: Diagnosis not present

## 2019-03-08 DIAGNOSIS — Z4932 Encounter for adequacy testing for peritoneal dialysis: Secondary | ICD-10-CM | POA: Diagnosis not present

## 2019-03-08 DIAGNOSIS — D509 Iron deficiency anemia, unspecified: Secondary | ICD-10-CM | POA: Diagnosis not present

## 2019-03-08 DIAGNOSIS — N2589 Other disorders resulting from impaired renal tubular function: Secondary | ICD-10-CM | POA: Diagnosis not present

## 2019-03-08 DIAGNOSIS — E119 Type 2 diabetes mellitus without complications: Secondary | ICD-10-CM | POA: Diagnosis not present

## 2019-03-08 DIAGNOSIS — N2581 Secondary hyperparathyroidism of renal origin: Secondary | ICD-10-CM | POA: Diagnosis not present

## 2019-03-09 DIAGNOSIS — N2589 Other disorders resulting from impaired renal tubular function: Secondary | ICD-10-CM | POA: Diagnosis not present

## 2019-03-09 DIAGNOSIS — R82998 Other abnormal findings in urine: Secondary | ICD-10-CM | POA: Diagnosis not present

## 2019-03-09 DIAGNOSIS — N2581 Secondary hyperparathyroidism of renal origin: Secondary | ICD-10-CM | POA: Diagnosis not present

## 2019-03-09 DIAGNOSIS — D631 Anemia in chronic kidney disease: Secondary | ICD-10-CM | POA: Diagnosis not present

## 2019-03-09 DIAGNOSIS — Z4932 Encounter for adequacy testing for peritoneal dialysis: Secondary | ICD-10-CM | POA: Diagnosis not present

## 2019-03-09 DIAGNOSIS — D509 Iron deficiency anemia, unspecified: Secondary | ICD-10-CM | POA: Diagnosis not present

## 2019-03-09 DIAGNOSIS — N186 End stage renal disease: Secondary | ICD-10-CM | POA: Diagnosis not present

## 2019-03-09 DIAGNOSIS — Z23 Encounter for immunization: Secondary | ICD-10-CM | POA: Diagnosis not present

## 2019-03-09 DIAGNOSIS — E119 Type 2 diabetes mellitus without complications: Secondary | ICD-10-CM | POA: Diagnosis not present

## 2019-03-10 ENCOUNTER — Encounter: Payer: Self-pay | Admitting: Internal Medicine

## 2019-03-10 DIAGNOSIS — R82998 Other abnormal findings in urine: Secondary | ICD-10-CM | POA: Diagnosis not present

## 2019-03-10 DIAGNOSIS — Z4932 Encounter for adequacy testing for peritoneal dialysis: Secondary | ICD-10-CM | POA: Diagnosis not present

## 2019-03-10 DIAGNOSIS — N2581 Secondary hyperparathyroidism of renal origin: Secondary | ICD-10-CM | POA: Diagnosis not present

## 2019-03-10 DIAGNOSIS — E119 Type 2 diabetes mellitus without complications: Secondary | ICD-10-CM | POA: Diagnosis not present

## 2019-03-10 DIAGNOSIS — D631 Anemia in chronic kidney disease: Secondary | ICD-10-CM | POA: Diagnosis not present

## 2019-03-10 DIAGNOSIS — N2589 Other disorders resulting from impaired renal tubular function: Secondary | ICD-10-CM | POA: Diagnosis not present

## 2019-03-10 DIAGNOSIS — N186 End stage renal disease: Secondary | ICD-10-CM | POA: Diagnosis not present

## 2019-03-10 DIAGNOSIS — D509 Iron deficiency anemia, unspecified: Secondary | ICD-10-CM | POA: Diagnosis not present

## 2019-03-10 DIAGNOSIS — Z23 Encounter for immunization: Secondary | ICD-10-CM | POA: Diagnosis not present

## 2019-03-11 DIAGNOSIS — N2589 Other disorders resulting from impaired renal tubular function: Secondary | ICD-10-CM | POA: Diagnosis not present

## 2019-03-11 DIAGNOSIS — D631 Anemia in chronic kidney disease: Secondary | ICD-10-CM | POA: Diagnosis not present

## 2019-03-11 DIAGNOSIS — Z23 Encounter for immunization: Secondary | ICD-10-CM | POA: Diagnosis not present

## 2019-03-11 DIAGNOSIS — Z4932 Encounter for adequacy testing for peritoneal dialysis: Secondary | ICD-10-CM | POA: Diagnosis not present

## 2019-03-11 DIAGNOSIS — E119 Type 2 diabetes mellitus without complications: Secondary | ICD-10-CM | POA: Diagnosis not present

## 2019-03-11 DIAGNOSIS — N2581 Secondary hyperparathyroidism of renal origin: Secondary | ICD-10-CM | POA: Diagnosis not present

## 2019-03-11 DIAGNOSIS — D509 Iron deficiency anemia, unspecified: Secondary | ICD-10-CM | POA: Diagnosis not present

## 2019-03-11 DIAGNOSIS — R82998 Other abnormal findings in urine: Secondary | ICD-10-CM | POA: Diagnosis not present

## 2019-03-11 DIAGNOSIS — N186 End stage renal disease: Secondary | ICD-10-CM | POA: Diagnosis not present

## 2019-03-12 DIAGNOSIS — E119 Type 2 diabetes mellitus without complications: Secondary | ICD-10-CM | POA: Diagnosis not present

## 2019-03-12 DIAGNOSIS — Z4932 Encounter for adequacy testing for peritoneal dialysis: Secondary | ICD-10-CM | POA: Diagnosis not present

## 2019-03-12 DIAGNOSIS — N2581 Secondary hyperparathyroidism of renal origin: Secondary | ICD-10-CM | POA: Diagnosis not present

## 2019-03-12 DIAGNOSIS — Z23 Encounter for immunization: Secondary | ICD-10-CM | POA: Diagnosis not present

## 2019-03-12 DIAGNOSIS — N186 End stage renal disease: Secondary | ICD-10-CM | POA: Diagnosis not present

## 2019-03-12 DIAGNOSIS — R82998 Other abnormal findings in urine: Secondary | ICD-10-CM | POA: Diagnosis not present

## 2019-03-12 DIAGNOSIS — D509 Iron deficiency anemia, unspecified: Secondary | ICD-10-CM | POA: Diagnosis not present

## 2019-03-12 DIAGNOSIS — N2589 Other disorders resulting from impaired renal tubular function: Secondary | ICD-10-CM | POA: Diagnosis not present

## 2019-03-12 DIAGNOSIS — D631 Anemia in chronic kidney disease: Secondary | ICD-10-CM | POA: Diagnosis not present

## 2019-03-13 DIAGNOSIS — N2581 Secondary hyperparathyroidism of renal origin: Secondary | ICD-10-CM | POA: Diagnosis not present

## 2019-03-13 DIAGNOSIS — N186 End stage renal disease: Secondary | ICD-10-CM | POA: Diagnosis not present

## 2019-03-13 DIAGNOSIS — E119 Type 2 diabetes mellitus without complications: Secondary | ICD-10-CM | POA: Diagnosis not present

## 2019-03-13 DIAGNOSIS — N2589 Other disorders resulting from impaired renal tubular function: Secondary | ICD-10-CM | POA: Diagnosis not present

## 2019-03-13 DIAGNOSIS — R82998 Other abnormal findings in urine: Secondary | ICD-10-CM | POA: Diagnosis not present

## 2019-03-13 DIAGNOSIS — Z4932 Encounter for adequacy testing for peritoneal dialysis: Secondary | ICD-10-CM | POA: Diagnosis not present

## 2019-03-13 DIAGNOSIS — D631 Anemia in chronic kidney disease: Secondary | ICD-10-CM | POA: Diagnosis not present

## 2019-03-13 DIAGNOSIS — Z23 Encounter for immunization: Secondary | ICD-10-CM | POA: Diagnosis not present

## 2019-03-13 DIAGNOSIS — D509 Iron deficiency anemia, unspecified: Secondary | ICD-10-CM | POA: Diagnosis not present

## 2019-03-14 ENCOUNTER — Ambulatory Visit: Payer: Medicare Other

## 2019-03-14 DIAGNOSIS — D631 Anemia in chronic kidney disease: Secondary | ICD-10-CM | POA: Diagnosis not present

## 2019-03-14 DIAGNOSIS — D509 Iron deficiency anemia, unspecified: Secondary | ICD-10-CM | POA: Diagnosis not present

## 2019-03-14 DIAGNOSIS — R82998 Other abnormal findings in urine: Secondary | ICD-10-CM | POA: Diagnosis not present

## 2019-03-14 DIAGNOSIS — Z23 Encounter for immunization: Secondary | ICD-10-CM | POA: Diagnosis not present

## 2019-03-14 DIAGNOSIS — N186 End stage renal disease: Secondary | ICD-10-CM | POA: Diagnosis not present

## 2019-03-14 DIAGNOSIS — Z4932 Encounter for adequacy testing for peritoneal dialysis: Secondary | ICD-10-CM | POA: Diagnosis not present

## 2019-03-14 DIAGNOSIS — N2581 Secondary hyperparathyroidism of renal origin: Secondary | ICD-10-CM | POA: Diagnosis not present

## 2019-03-14 DIAGNOSIS — E119 Type 2 diabetes mellitus without complications: Secondary | ICD-10-CM | POA: Diagnosis not present

## 2019-03-14 DIAGNOSIS — N2589 Other disorders resulting from impaired renal tubular function: Secondary | ICD-10-CM | POA: Diagnosis not present

## 2019-03-15 DIAGNOSIS — Z23 Encounter for immunization: Secondary | ICD-10-CM | POA: Diagnosis not present

## 2019-03-15 DIAGNOSIS — D631 Anemia in chronic kidney disease: Secondary | ICD-10-CM | POA: Diagnosis not present

## 2019-03-15 DIAGNOSIS — Z4932 Encounter for adequacy testing for peritoneal dialysis: Secondary | ICD-10-CM | POA: Diagnosis not present

## 2019-03-15 DIAGNOSIS — N2581 Secondary hyperparathyroidism of renal origin: Secondary | ICD-10-CM | POA: Diagnosis not present

## 2019-03-15 DIAGNOSIS — D509 Iron deficiency anemia, unspecified: Secondary | ICD-10-CM | POA: Diagnosis not present

## 2019-03-15 DIAGNOSIS — R82998 Other abnormal findings in urine: Secondary | ICD-10-CM | POA: Diagnosis not present

## 2019-03-15 DIAGNOSIS — N186 End stage renal disease: Secondary | ICD-10-CM | POA: Diagnosis not present

## 2019-03-15 DIAGNOSIS — N2589 Other disorders resulting from impaired renal tubular function: Secondary | ICD-10-CM | POA: Diagnosis not present

## 2019-03-15 DIAGNOSIS — E119 Type 2 diabetes mellitus without complications: Secondary | ICD-10-CM | POA: Diagnosis not present

## 2019-03-16 DIAGNOSIS — N186 End stage renal disease: Secondary | ICD-10-CM | POA: Diagnosis not present

## 2019-03-16 DIAGNOSIS — N2589 Other disorders resulting from impaired renal tubular function: Secondary | ICD-10-CM | POA: Diagnosis not present

## 2019-03-16 DIAGNOSIS — N2581 Secondary hyperparathyroidism of renal origin: Secondary | ICD-10-CM | POA: Diagnosis not present

## 2019-03-16 DIAGNOSIS — E119 Type 2 diabetes mellitus without complications: Secondary | ICD-10-CM | POA: Diagnosis not present

## 2019-03-16 DIAGNOSIS — D509 Iron deficiency anemia, unspecified: Secondary | ICD-10-CM | POA: Diagnosis not present

## 2019-03-16 DIAGNOSIS — R82998 Other abnormal findings in urine: Secondary | ICD-10-CM | POA: Diagnosis not present

## 2019-03-16 DIAGNOSIS — Z4932 Encounter for adequacy testing for peritoneal dialysis: Secondary | ICD-10-CM | POA: Diagnosis not present

## 2019-03-16 DIAGNOSIS — D631 Anemia in chronic kidney disease: Secondary | ICD-10-CM | POA: Diagnosis not present

## 2019-03-16 DIAGNOSIS — Z23 Encounter for immunization: Secondary | ICD-10-CM | POA: Diagnosis not present

## 2019-03-17 DIAGNOSIS — E119 Type 2 diabetes mellitus without complications: Secondary | ICD-10-CM | POA: Diagnosis not present

## 2019-03-17 DIAGNOSIS — N2581 Secondary hyperparathyroidism of renal origin: Secondary | ICD-10-CM | POA: Diagnosis not present

## 2019-03-17 DIAGNOSIS — N186 End stage renal disease: Secondary | ICD-10-CM | POA: Diagnosis not present

## 2019-03-17 DIAGNOSIS — Z4932 Encounter for adequacy testing for peritoneal dialysis: Secondary | ICD-10-CM | POA: Diagnosis not present

## 2019-03-17 DIAGNOSIS — D631 Anemia in chronic kidney disease: Secondary | ICD-10-CM | POA: Diagnosis not present

## 2019-03-17 DIAGNOSIS — D509 Iron deficiency anemia, unspecified: Secondary | ICD-10-CM | POA: Diagnosis not present

## 2019-03-17 DIAGNOSIS — R82998 Other abnormal findings in urine: Secondary | ICD-10-CM | POA: Diagnosis not present

## 2019-03-17 DIAGNOSIS — N2589 Other disorders resulting from impaired renal tubular function: Secondary | ICD-10-CM | POA: Diagnosis not present

## 2019-03-17 DIAGNOSIS — Z23 Encounter for immunization: Secondary | ICD-10-CM | POA: Diagnosis not present

## 2019-03-18 DIAGNOSIS — N186 End stage renal disease: Secondary | ICD-10-CM | POA: Diagnosis not present

## 2019-03-18 DIAGNOSIS — N2589 Other disorders resulting from impaired renal tubular function: Secondary | ICD-10-CM | POA: Diagnosis not present

## 2019-03-18 DIAGNOSIS — Z4932 Encounter for adequacy testing for peritoneal dialysis: Secondary | ICD-10-CM | POA: Diagnosis not present

## 2019-03-18 DIAGNOSIS — Z23 Encounter for immunization: Secondary | ICD-10-CM | POA: Diagnosis not present

## 2019-03-18 DIAGNOSIS — D509 Iron deficiency anemia, unspecified: Secondary | ICD-10-CM | POA: Diagnosis not present

## 2019-03-18 DIAGNOSIS — R82998 Other abnormal findings in urine: Secondary | ICD-10-CM | POA: Diagnosis not present

## 2019-03-18 DIAGNOSIS — N2581 Secondary hyperparathyroidism of renal origin: Secondary | ICD-10-CM | POA: Diagnosis not present

## 2019-03-18 DIAGNOSIS — D631 Anemia in chronic kidney disease: Secondary | ICD-10-CM | POA: Diagnosis not present

## 2019-03-18 DIAGNOSIS — E119 Type 2 diabetes mellitus without complications: Secondary | ICD-10-CM | POA: Diagnosis not present

## 2019-03-19 DIAGNOSIS — Z4932 Encounter for adequacy testing for peritoneal dialysis: Secondary | ICD-10-CM | POA: Diagnosis not present

## 2019-03-19 DIAGNOSIS — D631 Anemia in chronic kidney disease: Secondary | ICD-10-CM | POA: Diagnosis not present

## 2019-03-19 DIAGNOSIS — E119 Type 2 diabetes mellitus without complications: Secondary | ICD-10-CM | POA: Diagnosis not present

## 2019-03-19 DIAGNOSIS — N2589 Other disorders resulting from impaired renal tubular function: Secondary | ICD-10-CM | POA: Diagnosis not present

## 2019-03-19 DIAGNOSIS — D509 Iron deficiency anemia, unspecified: Secondary | ICD-10-CM | POA: Diagnosis not present

## 2019-03-19 DIAGNOSIS — Z23 Encounter for immunization: Secondary | ICD-10-CM | POA: Diagnosis not present

## 2019-03-19 DIAGNOSIS — R82998 Other abnormal findings in urine: Secondary | ICD-10-CM | POA: Diagnosis not present

## 2019-03-19 DIAGNOSIS — N186 End stage renal disease: Secondary | ICD-10-CM | POA: Diagnosis not present

## 2019-03-19 DIAGNOSIS — N2581 Secondary hyperparathyroidism of renal origin: Secondary | ICD-10-CM | POA: Diagnosis not present

## 2019-03-20 ENCOUNTER — Other Ambulatory Visit: Payer: Self-pay

## 2019-03-20 ENCOUNTER — Encounter: Payer: Self-pay | Admitting: Internal Medicine

## 2019-03-20 ENCOUNTER — Ambulatory Visit (INDEPENDENT_AMBULATORY_CARE_PROVIDER_SITE_OTHER): Payer: Medicare Other | Admitting: Internal Medicine

## 2019-03-20 VITALS — BP 140/78 | HR 60 | Temp 97.0°F | Resp 16 | Ht 61.0 in | Wt 156.2 lb

## 2019-03-20 DIAGNOSIS — K2100 Gastro-esophageal reflux disease with esophagitis, without bleeding: Secondary | ICD-10-CM

## 2019-03-20 DIAGNOSIS — E0821 Diabetes mellitus due to underlying condition with diabetic nephropathy: Secondary | ICD-10-CM

## 2019-03-20 DIAGNOSIS — I700 Atherosclerosis of aorta: Secondary | ICD-10-CM

## 2019-03-20 DIAGNOSIS — I10 Essential (primary) hypertension: Secondary | ICD-10-CM | POA: Diagnosis not present

## 2019-03-20 DIAGNOSIS — D509 Iron deficiency anemia, unspecified: Secondary | ICD-10-CM | POA: Diagnosis not present

## 2019-03-20 DIAGNOSIS — N186 End stage renal disease: Secondary | ICD-10-CM | POA: Diagnosis not present

## 2019-03-20 DIAGNOSIS — Z79899 Other long term (current) drug therapy: Secondary | ICD-10-CM

## 2019-03-20 DIAGNOSIS — Z23 Encounter for immunization: Secondary | ICD-10-CM | POA: Diagnosis not present

## 2019-03-20 DIAGNOSIS — Z0000 Encounter for general adult medical examination without abnormal findings: Secondary | ICD-10-CM | POA: Diagnosis not present

## 2019-03-20 DIAGNOSIS — E119 Type 2 diabetes mellitus without complications: Secondary | ICD-10-CM | POA: Diagnosis not present

## 2019-03-20 DIAGNOSIS — Z0001 Encounter for general adult medical examination with abnormal findings: Secondary | ICD-10-CM

## 2019-03-20 DIAGNOSIS — Z8249 Family history of ischemic heart disease and other diseases of the circulatory system: Secondary | ICD-10-CM

## 2019-03-20 DIAGNOSIS — Z136 Encounter for screening for cardiovascular disorders: Secondary | ICD-10-CM

## 2019-03-20 DIAGNOSIS — E559 Vitamin D deficiency, unspecified: Secondary | ICD-10-CM

## 2019-03-20 DIAGNOSIS — N2581 Secondary hyperparathyroidism of renal origin: Secondary | ICD-10-CM | POA: Diagnosis not present

## 2019-03-20 DIAGNOSIS — Z1212 Encounter for screening for malignant neoplasm of rectum: Secondary | ICD-10-CM

## 2019-03-20 DIAGNOSIS — E782 Mixed hyperlipidemia: Secondary | ICD-10-CM | POA: Diagnosis not present

## 2019-03-20 DIAGNOSIS — Z992 Dependence on renal dialysis: Secondary | ICD-10-CM | POA: Diagnosis not present

## 2019-03-20 DIAGNOSIS — R82998 Other abnormal findings in urine: Secondary | ICD-10-CM | POA: Diagnosis not present

## 2019-03-20 DIAGNOSIS — D631 Anemia in chronic kidney disease: Secondary | ICD-10-CM | POA: Diagnosis not present

## 2019-03-20 DIAGNOSIS — E1122 Type 2 diabetes mellitus with diabetic chronic kidney disease: Secondary | ICD-10-CM | POA: Diagnosis not present

## 2019-03-20 DIAGNOSIS — M1000 Idiopathic gout, unspecified site: Secondary | ICD-10-CM

## 2019-03-20 DIAGNOSIS — Z4932 Encounter for adequacy testing for peritoneal dialysis: Secondary | ICD-10-CM | POA: Diagnosis not present

## 2019-03-20 DIAGNOSIS — Z1211 Encounter for screening for malignant neoplasm of colon: Secondary | ICD-10-CM

## 2019-03-20 DIAGNOSIS — N2589 Other disorders resulting from impaired renal tubular function: Secondary | ICD-10-CM | POA: Diagnosis not present

## 2019-03-20 NOTE — Progress Notes (Signed)
[facility] Unk Pinto, M.D.     Uvaldo Bristle. Silverio Lay, P.A.-C Liane Comber, [place] [address] [place], [place] [address] Telephone [phone] Telefax [phone] Annual Screening/Preventative Visit & Comprehensive Evaluation &  Examination  History of Present Illness:        This very nice 78 y.o. DBF presents for a Screening /Preventative Visit & comprehensive evaluation and management of multiple medical co-morbidities.  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 She has Gout controlled on her meds. Patient had recent ER evaluation for weakness & K was low at 2.9.       HTN predates circa 1990's. Patient's BP has been controlled at home and patient denies any cardiac symptoms as chest pain, palpitations, shortness of breath, dizziness or ankle swelling. Today's BP was initially sl elevated & rechecked at goal - 140/78.      Patient's hyperlipidemia is controlled with diet and medications. Patient denies myalgias or other medication SE's. Last lipids were not at goal: Lab Results  Component Value Date   CHOL 200 (H) 03/20/2019   HDL 43 (L) 03/20/2019   LDLCALC 131 (H) 03/20/2019   TRIG 149 03/20/2019   CHOLHDL 4.7 03/20/2019      Patient has hx/o T2_NIDDM (A1c 6.6% / 1999) and she's attempting dietarycontrol. Patient is on home peritoneal Dialysis for Diabetic ESRD since 01/2016 and is followed closely  by Dr Raymondo Band.  Patient denies reactive hypoglycemic symptoms, visual blurring, diabetic polys or paresthesias. Current A1c is not at goal: Lab Results  Component Value Date   HGBA1C 6.5 (H) 03/20/2019      Finally, patient has history of Vitamin D Deficiency ("39" / 2014 and "45" / 2016)  and current Vitamin D is not at goal (70-100): Lab Results  Component Value Date   VD25OH 41 03/20/2019   Current Outpatient Medications  on File Prior to Visit  Medication Sig  . allopurinol (ZYLOPRIM) 300 MG tablet TAKE 1/2 TO 1 TABLET BY  MOUTH DAILY AS DIRECTED (Patient taking differently: Take 300 mg by mouth every other day. )  . aspirin 81 MG tablet Take 81 mg by mouth at bedtime.   Marland Kitchen b complex-vitamin c-folic acid (NEPHRO-VITE) 0.8 MG TABS tablet Take 1 tablet by mouth at bedtime.  . calcitRIOL (ROCALTROL) 0.5 MCG capsule Take 0.5 mcg by mouth daily.   . calcium acetate (PHOSLO) 667 MG capsule Take 667 mg by mouth 3 (three) times daily with meals.   . Cholecalciferol (VITAMIN D-3 PO) Take 5,000 Units by mouth daily.   . cinacalcet (SENSIPAR) 30 MG tablet Take 30 mg by mouth 2 (two) times daily.   Marland Kitchen docusate sodium (COLACE) 100 MG capsule Take 100 mg by mouth daily.   . Flaxseed, Linseed, (FLAXSEED OIL) 1000 MG CAPS Take 1 capsule by mouth daily.   Marland Kitchen glucose blood (ACCU-CHEK AVIVA PLUS) test strip Test once daily  . hydrALAZINE (APRESOLINE) 10 MG tablet TAKE 1 TABLET BY MOUTH 2  TIMES DAILY.  Marland Kitchen labetalol (NORMODYNE) 200 MG tablet TAKE 1 TABLET BY MOUTH TWO  TIMES DAILY  . Omega-3 Fatty Acids (FISH OIL) 1200 MG CAPS Take 1 capsule by mouth daily.   . sevelamer carbonate (RENVELA) 800 MG tablet Take 800 mg by mouth 3 (three) times daily with meals.   Marland Kitchen ZINC GLUCONATE PO Take 40 mg by mouth daily.    No current facility-administered medications on  file prior to visit.    Allergies  Allergen Reactions  . Ace Inhibitors     unknown  . Nsaids     unknown   Past Medical History:  Diagnosis Date  . Anemia   . Arthritis    Osteoarthritis  . Chronic kidney disease    Chronic renal insufficiency  . Diabetes mellitus    Pre  . Diverticulosis   . GERD (gastroesophageal reflux disease)   . Gout   . Hiatal hernia   . History of blood transfusion   . Hyperlipidemia   . Hypertension   . Osteopenia   . Pancreatic cyst 1999  . Thyroid disease    Hyperparathyroid    Health Maintenance  Topic Date Due  .  OPHTHALMOLOGY EXAM  02/01/2019  . INFLUENZA VACCINE  06/21/2019  . HEMOGLOBIN A1C  09/19/2019  . FOOT EXAM  12/21/2019  . TETANUS/TDAP  06/29/2024  . DEXA SCAN  Completed  . PNA vac Low Risk Adult  Completed   Immunization History  Administered Date(s) Administered  . DT 02/05/2014  . Influenza, High Dose Seasonal PF 09/25/2013  . Influenza-Unspecified 08/11/2015, 07/21/2017  . Pneumococcal Polysaccharide-23 11/20/2009  . Td 11/21/2003  . Zoster 11/20/2005   Last Colon - 2013  -Dr Daine Gravel and no f/u recommended.   Last MGM - 03/08/2018 and f/u scheduled 05/09/2019  Past Surgical History:  Procedure Laterality Date  . CHOLECYSTECTOMY    . COLONOSCOPY  03/21/12   Next one in 2018  . EYE SURGERY Left    cataract extraction with IOL  . INSERTION OF DIALYSIS CATHETER N/A 02/15/2017   Procedure: REVISION OF DIALYSIS CATHETER;  Surgeon: Algernon Huxley, MD;  Location: [facility] ORS;  Service: Cardiovascular;  Laterality: N/A;  . JOINT REPLACEMENT  2012   left knee  . left knee replacement     . PANCREATIC CYST EXCISION  1999  . TOTAL HIP ARTHROPLASTY Right 05/26/2015   Procedure: RIGHT TOTAL HIP ARTHROPLASTY ANTERIOR APPROACH;  Surgeon: Gaynelle Arabian, MD;  Location: [facility] ORS;  Service: Orthopedics;  Laterality: Right;  . TOTAL KNEE ARTHROPLASTY Right 06/09/2013   Procedure: RIGHT TOTAL KNEE ARTHROPLASTY;  Surgeon: Gearlean Alf, MD;  Location: [facility] ORS;  Service: Orthopedics;  Laterality: Right;   Family History  Problem Relation Age of Onset  . Cancer Father        Prostate and Throat  . Hypertension Mother   . Cancer Mother        Colon with METS  . Diabetes Mother    Social History   Tobacco Use  . Smoking status: Never Smoker  . Smokeless tobacco: Never Used  Substance Use Topics  . Alcohol use: No    Alcohol/week: 0.0 standard drinks  . Drug use: No    ROS Constitutional: Denies fever, chills, weight loss/gain, headaches, insomnia,  night sweats, and change in appetite.  Does c/o fatigue. Eyes: Denies redness, blurred vision, diplopia, discharge, itchy, watery eyes.  ENT: Denies discharge, congestion, post nasal drip, epistaxis, sore throat, earache, hearing loss, dental pain, Tinnitus, Vertigo, Sinus pain, snoring.  Cardio: Denies chest pain, palpitations, irregular heartbeat, syncope, dyspnea, diaphoresis, orthopnea, PND, claudication, edema Respiratory: denies cough, dyspnea, DOE, pleurisy, hoarseness, laryngitis, wheezing.  Gastrointestinal: Denies dysphagia, heartburn, reflux, water brash, pain, cramps, nausea, vomiting, bloating, diarrhea, constipation, hematemesis, melena, hematochezia, jaundice, hemorrhoids Genitourinary: Denies dysuria, frequency, urgency, nocturia, hesitancy, discharge, hematuria, flank pain Breast: Breast lumps, nipple discharge, bleeding.  Musculoskeletal: Denies arthralgia, myalgia, stiffness, Jt. Swelling,  pain, limp, and strain/sprain. Denies falls. Skin: Denies puritis, rash, hives, warts, acne, eczema, changing in skin lesion Neuro: No weakness, tremor, incoordination, spasms, paresthesia, pain Psychiatric: Denies confusion, memory loss, sensory loss. Denies Depression. Endocrine: Denies change in weight, skin, hair change, nocturia, and paresthesia, diabetic polys, visual blurring, hyper / hypo glycemic episodes.  Heme/Lymph: No excessive bleeding, bruising, enlarged lymph nodes.  Physical Exam  BP 140/78   Pulse 60   Temp (!) 97 F (36.1 C)   Resp 16   Ht 5\' 1"  (1.549 m)   Wt 156 lb 3.2 oz (70.9 kg)   BMI 29.51 kg/m   General Appearance: Well nourished, well groomed and in no apparent distress.  Eyes: PERRLA, EOMs, conjunctiva no swelling or erythema, normal fundi and vessels. Sinuses: No frontal/maxillary tenderness ENT/Mouth: EACs patent / TMs  nl. Nares clear without erythema, swelling, mucoid exudates. Oral hygiene is good. No erythema, swelling, or exudate. Tongue normal, non-obstructing. Tonsils not swollen or  erythematous. Hearing normal.  Neck: Supple, thyroid not palpable. No bruits, nodes or JVD. Respiratory: Respiratory effort normal.  BS equal and clear bilateral without rales, rhonci, wheezing or stridor. Cardio: Heart sounds are normal with regular rate and rhythm and no murmurs, rubs or gallops. Peripheral pulses are normal and equal bilaterally without edema. No aortic or femoral bruits. Chest: symmetric with normal excursions and percussion. Breasts: Symmetric, without lumps, nipple discharge, retractions, or fibrocystic changes.  Abdomen: Flat, soft with bowel sounds active. Nontender, no guarding, rebound, hernias, masses, or organomegaly.  Lymphatics: Non tender without lymphadenopathy.  Musculoskeletal: Full ROM all peripheral extremities, joint stability, 5/5 strength, and normal gait. Skin: Warm and dry without rashes, lesions, cyanosis, clubbing or  ecchymosis.  Neuro: Cranial nerves intact, reflexes equal bilaterally. Normal muscle tone, no cerebellar symptoms. Sensation intact to touch, vibratory and Monofilament to the toes bilaterally. Pysch: Alert and oriented X 3, normal affect, Insight and Judgment appropriate.   Assessment and Plan  1. Annual Preventative Screening Examination  2. Essential hypertension  - EKG 12-Lead - TSH - Magnesium - CBC with Differential/Platelet  3. Hyperlipidemia, mixed  - EKG 12-Lead - TSH - Lipid panel  4. Diabetes mellitus due to underlying condition with diabetic nephropathy, without long-term current use of insulin (HCC)  - HM DIABETES FOOT EXAM - LOW EXTREMITY NEUR EXAM DOCUM - Insulin, random  5. Vitamin D deficiency  - VITAMIN D 25 Hydroxy  6. ESRD (end stage renal disease) ([place])   7. Idiopathic gout  - Uric acid  8. GERD  - CBC with Differential/Platelet  9. Secondary hyperparathyroidism ([facility])  10. Aortic atherosclerosis (HCC)  - EKG 12-Lead  11. Screening for colorectal cancer  - POC Hemoccult Bld/Stl    12. Screening for ischemic heart disease  - EKG 12-Lead  13. FH: hypertension  - EKG 12-Lead  14. Medication management  - VITAMIN D 25 Hydroxy - Insulin, random - TSH - Lipid panel - Magnesium - CBC with Differential/Platelet - Uric acid       Patient was counseled in prudent diet to achieve/maintain BMI less than 25 for weight control, BP monitoring, regular exercise and medications. Discussed med's effects and SE's. Screening labs and tests as requested with regular follow-up as recommended. I discussed the assessment and treatment plan as above with the patient. The patient was provided an opportunity to ask questions and all were answered. The patient agreed with the plan and demonstrated an understanding of the instructions. I provided  over 40 minutes of  exam, counseling, chart review and  complex critical decision making was performed   Kirtland Bouchard, MD

## 2019-03-20 NOTE — Patient Instructions (Signed)
>>>>>>>>>>>>>>>>>>>>>>>>>>>>>>>>>>>>>>>>>>>>>>>>>>>>>>> Coronavirus (COVID-19) Are you at risk?  Are you at risk for the Coronavirus (COVID-19)?  To be considered HIGH RISK for Coronavirus (COVID-19), you have to meet the following criteria:  . Traveled to [place], [place], [place], [place] or [place]; or in the [place] to [place], [place], [place]  . or [place]; and have fever, cough, and shortness of breath within the last 2 weeks of travel OR . Been in close contact with a person diagnosed with COVID-19 within the last 2 weeks and have  . fever, cough,and shortness of breath .  . IF YOU DO NOT MEET THESE CRITERIA, YOU ARE CONSIDERED LOW RISK FOR COVID-19.  What to do if you are HIGH RISK for COVID-19?  Marland Kitchen If you are having a medical emergency, call 911. . Seek medical care right away. Before you go to a doctor's office, urgent care or emergency department, .  call ahead and tell them about your recent travel, contact with someone diagnosed with COVID-19  .  and your symptoms.  . You should receive instructions from your physician's office regarding next steps of care.  . When you arrive at healthcare provider, tell the healthcare staff immediately you have returned from  . visiting [place], [place], [place], [place] or [place]; or traveled in the [place] to [place], [place],  . [place] or [place] in the last two weeks or you have been in close contact with a person diagnosed with  . COVID-19 in the last 2 weeks.   . Tell the health care staff about your symptoms: fever, cough and shortness of breath. . After you have been seen by a medical provider, you will be either: o Tested for (COVID-19) and discharged home on quarantine except to seek medical care if  o symptoms worsen, and asked to  - Stay home and avoid contact with others until you get your results (4-5 days)  - Avoid travel on public transportation if possible (such as bus, train, or airplane)  or o Sent to the Emergency Department by EMS for evaluation, COVID-19 testing  and  o possible admission depending on your condition and test results.  What to do if you are LOW RISK for COVID-19?  Reduce your risk of any infection by using the same precautions used for avoiding the common cold or flu:  Marland Kitchen Wash your hands often with soap and warm water for at least 20 seconds.  If soap and water are not readily available,  . use an alcohol-based hand sanitizer with at least 60% alcohol.  . If coughing or sneezing, cover your mouth and nose by coughing or sneezing into the elbow areas of your shirt or coat, .  into a tissue or into your sleeve (not your hands). . Avoid shaking hands with others and consider head nods or verbal greetings only. . Avoid touching your eyes, nose, or mouth with unwashed hands.  . Avoid close contact with people who are sick. . Avoid places or events with large numbers of people in one location, like concerts or sporting events. . Carefully consider travel plans you have or are making. . If you are planning any travel outside or inside the [place], visit the CDC's Travelers' Health webpage for the latest health notices. . If you have some symptoms but not all symptoms, continue to monitor at home and seek medical attention  . if your symptoms worsen. . If you are having a medical emergency, call 911.   . >>>>>>>>>>>>>>>>>>>>>>>>>>>>>>>>> .  We Do NOT Approve of  [facility], [employer]  Our Patients  To Do Home Visits & We Do NOT Approve of [employer] > > > > > > > > > > > > > > > > > > > > > > > > > > > > > > > > > > > > > > >  Preventive Care for Adults  A healthy lifestyle and preventive care can promote health and wellness. Preventive health guidelines for women include the following key practices.  A routine yearly physical is a good way to check with your health care provider about your health and preventive screening. It is a  chance to share any concerns and updates on your health and to receive a thorough exam.  Visit your dentist for a routine exam and preventive care every 6 months. Brush your teeth twice a day and floss once a day. Good oral hygiene prevents tooth decay and gum disease.  The frequency of eye exams is based on your age, health, family medical history, use of contact lenses, and other factors. Follow your health care provider's recommendations for frequency of eye exams.  Eat a healthy diet. Foods like vegetables, fruits, whole grains, low-fat dairy products, and lean protein foods contain the nutrients you need without too many calories. Decrease your intake of foods high in solid fats, added sugars, and salt. Eat the right amount of calories for you. Get information about a proper diet from your health care provider, if necessary.  Regular physical exercise is one of the most important things you can do for your health. Most adults should get at least 150 minutes of moderate-intensity exercise (any activity that increases your heart rate and causes you to sweat) each week. In addition, most adults need muscle-strengthening exercises on 2 or more days a week.  Maintain a healthy weight. The body mass index (BMI) is a screening tool to identify possible weight problems. It provides an estimate of body fat based on height and weight. Your health care provider can find your BMI and can help you achieve or maintain a healthy weight. For adults 20 years and older:  A BMI below 18.5 is considered underweight.  A BMI of 18.5 to 24.9 is normal.  A BMI of 25 to 29.9 is considered overweight.  A BMI of 30 and above is considered obese.  Maintain normal blood lipids and cholesterol levels by exercising and minimizing your intake of saturated fat. Eat a balanced diet with plenty of fruit and vegetables. If your lipid or cholesterol levels are high, you are over 50, or you are at high risk for heart disease,  you may need your cholesterol levels checked more frequently. Ongoing high lipid and cholesterol levels should be treated with medicines if diet and exercise are not working.  If you smoke, find out from your health care provider how to quit. If you do not use tobacco, do not start.  Lung cancer screening is recommended for adults aged 60-80 years who are at high risk for developing lung cancer because of a history of smoking. A yearly low-dose CT scan of the lungs is recommended for people who have at least a 30-pack-year history of smoking and are a current smoker or have quit within the past 15 years. A pack year of smoking is smoking an average of 1 pack of cigarettes a day for 1 year (for example: 1 pack a day for 30 years or 2 packs a  day for 15 years). Yearly screening should continue until the smoker has stopped smoking for at least 15 years. Yearly screening should be stopped for people who develop a health problem that would prevent them from having lung cancer treatment.  Avoid use of street drugs. Do not share needles with anyone. Ask for help if you need support or instructions about stopping the use of drugs.  High blood pressure causes heart disease and increases the risk of stroke.  Ongoing high blood pressure should be treated with medicines if weight loss and exercise do not work.  If you are 66-57 years old, ask your health care provider if you should take aspirin to prevent strokes.  Diabetes screening involves taking a blood sample to check your fasting blood sugar level. This should be done once every 3 years, after age 83, if you are within normal weight and without risk factors for diabetes. Testing should be considered at a younger age or be carried out more frequently if you are overweight and have at least 1 risk factor for diabetes.  Breast cancer screening is essential preventive care for women. You should practice "breast self-awareness." This means understanding the  normal appearance and feel of your breasts and may include breast self-examination. Any changes detected, no matter how small, should be reported to a health care provider. Women in their 36s and 30s should have a clinical breast exam (CBE) by a health care provider as part of a regular health exam every 1 to 3 years. After age 47, women should have a CBE every year. Starting at age 72, women should consider having a mammogram (breast X-ray test) every year. Women who have a family history of breast cancer should talk to their health care provider about genetic screening. Women at a high risk of breast cancer should talk to their health care providers about having an MRI and a mammogram every year.  Breast cancer gene (BRCA)-related cancer risk assessment is recommended for women who have family members with BRCA-related cancers. BRCA-related cancers include breast, ovarian, tubal, and peritoneal cancers. Having family members with these cancers may be associated with an increased risk for harmful changes (mutations) in the breast cancer genes BRCA1 and BRCA2. Results of the assessment will determine the need for genetic counseling and BRCA1 and BRCA2 testing.  Routine pelvic exams to screen for cancer are no longer recommended for nonpregnant women who are considered low risk for cancer of the pelvic organs (ovaries, uterus, and vagina) and who do not have symptoms. Ask your health care provider if a screening pelvic exam is right for you.  If you have had past treatment for cervical cancer or a condition that could lead to cancer, you need Pap tests and screening for cancer for at least 20 years after your treatment. If Pap tests have been discontinued, your risk factors (such as having a new sexual partner) need to be reassessed to determine if screening should be resumed. Some women have medical problems that increase the chance of getting cervical cancer. In these cases, your health care provider may  recommend more frequent screening and Pap tests.    Colorectal cancer can be detected and often prevented. Most routine colorectal cancer screening begins at the age of 60 years and continues through age 39 years. However, your health care provider may recommend screening at an earlier age if you have risk factors for colon cancer. On a yearly basis, your health care provider may provide home test kits to  check for hidden blood in the stool. Use of a small camera at the end of a tube, to directly examine the colon (sigmoidoscopy or colonoscopy), can detect the earliest forms of colorectal cancer. Talk to your health care provider about this at age 14, when routine screening begins.  Direct exam of the colon should be repeated every 5-10 years through age 37 years, unless early forms of pre-cancerous polyps or small growths are found.  Osteoporosis is a disease in which the bones lose minerals and strength with aging. This can result in serious bone fractures or breaks. The risk of osteoporosis can be identified using a bone density scan. Women ages 21 years and over and women at risk for fractures or osteoporosis should discuss screening with their health care providers. Ask your health care provider whether you should take a calcium supplement or vitamin D to reduce the rate of osteoporosis.  Menopause can be associated with physical symptoms and risks. Hormone replacement therapy is available to decrease symptoms and risks. You should talk to your health care provider about whether hormone replacement therapy is right for you.  Use sunscreen. Apply sunscreen liberally and repeatedly throughout the day. You should seek shade when your shadow is shorter than you. Protect yourself by wearing long sleeves, pants, a wide-brimmed hat, and sunglasses year round, whenever you are outdoors.  Once a month, do a whole body skin exam, using a mirror to look at the skin on your back. Tell your health care provider  of new moles, moles that have irregular borders, moles that are larger than a pencil eraser, or moles that have changed in shape or color.  Stay current with required vaccines (immunizations).  Influenza vaccine. All adults should be immunized every year.  Tetanus, diphtheria, and acellular pertussis (Td, Tdap) vaccine. Pregnant women should receive 1 dose of Tdap vaccine during each pregnancy. The dose should be obtained regardless of the length of time since the last dose. Immunization is preferred during the 27th-36th week of gestation. An adult who has not previously received Tdap or who does not know her vaccine status should receive 1 dose of Tdap. This initial dose should be followed by tetanus and diphtheria toxoids (Td) booster doses every 10 years. Adults with an unknown or incomplete history of completing a 3-dose immunization series with Td-containing vaccines should begin or complete a primary immunization series including a Tdap dose. Adults should receive a Td booster every 10 years.    Zoster vaccine. One dose is recommended for adults aged 71 years or older unless certain conditions are present.    Pneumococcal 13-valent conjugate (PCV13) vaccine. When indicated, a person who is uncertain of her immunization history and has no record of immunization should receive the PCV13 vaccine. An adult aged 35 years or older who has certain medical conditions and has not been previously immunized should receive 1 dose of PCV13 vaccine. This PCV13 should be followed with a dose of pneumococcal polysaccharide (PPSV23) vaccine. The PPSV23 vaccine dose should be obtained at least 1 or more year(s) after the dose of PCV13 vaccine. An adult aged 57 years or older who has certain medical conditions and previously received 1 or more doses of PPSV23 vaccine should receive 1 dose of PCV13. The PCV13 vaccine dose should be obtained 1 or more years after the last PPSV23 vaccine dose.    Pneumococcal  polysaccharide (PPSV23) vaccine. When PCV13 is also indicated, PCV13 should be obtained first. All adults aged 20 years and older  should be immunized. An adult younger than age 58 years who has certain medical conditions should be immunized. Any person who resides in a nursing home or long-term care facility should be immunized. An adult smoker should be immunized. People with an immunocompromised condition and certain other conditions should receive both PCV13 and PPSV23 vaccines. People with human immunodeficiency virus (HIV) infection should be immunized as soon as possible after diagnosis. Immunization during chemotherapy or radiation therapy should be avoided. Routine use of PPSV23 vaccine is not recommended for American Indians, [place] Natives, or people younger than 65 years unless there are medical conditions that require PPSV23 vaccine. When indicated, people who have unknown immunization and have no record of immunization should receive PPSV23 vaccine. One-time revaccination 5 years after the first dose of PPSV23 is recommended for people aged 19-64 years who have chronic kidney failure, nephrotic syndrome, asplenia, or immunocompromised conditions. People who received 1-2 doses of PPSV23 before age 82 years should receive another dose of PPSV23 vaccine at age 2 years or later if at least 5 years have passed since the previous dose. Doses of PPSV23 are not needed for people immunized with PPSV23 at or after age 9 years.   Preventive Services / Frequency  Ages 40 years and over  Blood pressure check.  Lipid and cholesterol check.  Lung cancer screening. / Every year if you are aged 49-80 years and have a 30-pack-year history of smoking and currently smoke or have quit within the past 15 years. Yearly screening is stopped once you have quit smoking for at least 15 years or develop a health problem that would prevent you from having lung cancer treatment.  Clinical breast exam.** / Every year  after age 53 years.   BRCA-related cancer risk assessment.** / For women who have family members with a BRCA-related cancer (breast, ovarian, tubal, or peritoneal cancers).  Mammogram.** / Every year beginning at age 56 years and continuing for as long as you are in good health. Consult with your health care provider.  Pap test.** / Every 3 years starting at age 73 years through age 8 or 48 years with 3 consecutive normal Pap tests. Testing can be stopped between 65 and 70 years with 3 consecutive normal Pap tests and no abnormal Pap or HPV tests in the past 10 years.  Fecal occult blood test (FOBT) of stool. / Every year beginning at age 58 years and continuing until age 53 years. You may not need to do this test if you get a colonoscopy every 10 years.  Flexible sigmoidoscopy or colonoscopy.** / Every 5 years for a flexible sigmoidoscopy or every 10 years for a colonoscopy beginning at age 35 years and continuing until age 78 years.  Hepatitis C blood test.** / For all people born from [birth year] through 1966 and any individual with known risks for hepatitis C.  Osteoporosis screening.** / A one-time screening for women ages 54 years and over and women at risk for fractures or osteoporosis.  Skin self-exam. / Monthly.  Influenza vaccine. / Every year.  Tetanus, diphtheria, and acellular pertussis (Tdap/Td) vaccine.** / 1 dose of Td every 10 years.  Zoster vaccine.** / 1 dose for adults aged 48 years or older.  Pneumococcal 13-valent conjugate (PCV13) vaccine.** / Consult your health care provider.  Pneumococcal polysaccharide (PPSV23) vaccine.** / 1 dose for all adults aged 14 years and older. Screening for abdominal aortic aneurysm (AAA)  by ultrasound is recommended for people who have history of high blood pressure  or who are current or former smokers. ++++++++++++++++++++ Recommend Adult Low Dose Aspirin or  coated  Aspirin 81 mg daily  To reduce risk of Colon Cancer 20 %,  Skin  Cancer 26 % ,  Melanoma 46%  and  Pancreatic cancer 60% ++++++++++++++++++++ Vitamin D goal  is between 70-100.  Please make sure that you are taking your Vitamin D as directed.  It is very important as a natural anti-inflammatory  helping hair, skin, and nails, as well as reducing stroke and heart attack risk.  It helps your bones and helps with mood. It also decreases numerous cancer risks so please take it as directed.  Low Vit D is associated with a 200-300% higher risk for CANCER  and 200-300% higher risk for HEART   ATTACK  &  STROKE.   .....................................Marland Kitchen It is also associated with higher death rate at younger ages,  autoimmune diseases like Rheumatoid arthritis, Lupus, Multiple Sclerosis.    Also many other serious conditions, like depression, Alzheimer's Dementia, infertility, muscle aches, fatigue, fibromyalgia - just to name a few. ++++++++++++++++++ Recommend the book "The END of DIETING" by Dr Excell Seltzer  & the book "The END of DIABETES " by Dr Excell Seltzer At [email or web address] - get book & Audio CD's    Being diabetic has a  300% increased risk for heart attack, stroke, cancer, and alzheimer- type vascular dementia. It is very important that you work harder with diet by avoiding all foods that are white. Avoid white rice (brown & wild rice is OK), white potatoes (sweetpotatoes in moderation is OK), White bread or wheat bread or anything made out of white flour like bagels, donuts, rolls, buns, biscuits, cakes, pastries, cookies, pizza crust, and pasta (made from white flour & egg whites) - vegetarian pasta or spinach or wheat pasta is OK. Multigrain breads like Arnold's or Pepperidge [employer], or multigrain sandwich thins or flatbreads.  Diet, exercise and weight loss can reverse and cure diabetes in the early stages.  Diet, exercise and weight loss is very important in the control and prevention of complications of diabetes which affects every system in your body, ie.  Brain - dementia/stroke, eyes - glaucoma/blindness, heart - heart attack/heart failure, kidneys - dialysis, stomach - gastric paralysis, intestines - malabsorption, nerves - severe painful neuritis, circulation - gangrene & loss of a leg(s), and finally cancer and Alzheimers.    I recommend avoid fried & greasy foods,  sweets/candy, white rice (brown or wild rice or Quinoa is OK), white potatoes (sweet potatoes are OK) - anything made from white flour - bagels, doughnuts, rolls, buns, biscuits,white and wheat breads, pizza crust and traditional pasta made of white flour & egg white(vegetarian pasta or spinach or wheat pasta is OK).  Multi-grain bread is OK - like multi-grain flat bread or sandwich thins. Avoid alcohol in excess. Exercise is also important.    Eat all the vegetables you want - avoid meat, especially red meat and dairy - especially cheese.  Cheese is the most concentrated form of trans-fats which is the worst thing to clog up our arteries. Veggie cheese is OK which can be found in the fresh produce section at [employer] or [employer] or [employer]  +++++++++++++++++++ DASH Eating Plan  DASH stands for "Dietary Approaches to Stop Hypertension."   The DASH eating plan is a healthy eating plan that has been shown to reduce high blood pressure (hypertension). Additional health benefits may include reducing the risk of type 2 diabetes mellitus, heart  disease, and stroke. The DASH eating plan may also help with weight loss. WHAT DO I NEED TO KNOW ABOUT THE DASH EATING PLAN? For the DASH eating plan, you will follow these general guidelines:  Choose foods with a percent daily value for sodium of less than 5% (as listed on the food label).  Use salt-free seasonings or herbs instead of table salt or sea salt.  Check with your health care provider or pharmacist before using salt substitutes.  Eat lower-sodium products, often labeled as "lower sodium" or "no salt added."  Eat fresh  foods.  Eat more vegetables, fruits, and low-fat dairy products.  Choose whole grains. Look for the word "whole" as the first word in the ingredient list.  Choose fish   Limit sweets, desserts, sugars, and sugary drinks.  Choose heart-healthy fats.  Eat veggie cheese   Eat more home-cooked food and less restaurant, buffet, and fast food.  Limit fried foods.  Cook foods using methods other than frying.  Limit canned vegetables. If you do use them, rinse them well to decrease the sodium.  When eating at a restaurant, ask that your food be prepared with less salt, or no salt if possible.                      WHAT FOODS CAN I EAT? Read Dr Fara Olden Fuhrman's books on The End of Dieting & The End of Diabetes  Grains Whole grain or whole wheat bread. Brown rice. Whole grain or whole wheat pasta. Quinoa, bulgur, and whole grain cereals. Low-sodium cereals. Corn or whole wheat flour tortillas. Whole grain cornbread. Whole grain crackers. Low-sodium crackers.  Vegetables Fresh or frozen vegetables (raw, steamed, roasted, or grilled). Low-sodium or reduced-sodium tomato and vegetable juices. Low-sodium or reduced-sodium tomato sauce and paste. Low-sodium or reduced-sodium canned vegetables.   Fruits All fresh, canned (in natural juice), or frozen fruits.  Protein Products  All fish and seafood.  Dried beans, peas, or lentils. Unsalted nuts and seeds. Unsalted canned beans.  Dairy Low-fat dairy products, such as skim or 1% milk, 2% or reduced-fat cheeses, low-fat ricotta or cottage cheese, or plain low-fat yogurt. Low-sodium or reduced-sodium cheeses.  Fats and Oils Tub margarines without trans fats. Light or reduced-fat mayonnaise and salad dressings (reduced sodium). Avocado. Safflower, olive, or canola oils. Natural peanut or almond butter.  Other Unsalted popcorn and pretzels. The items listed above may not be a complete list of recommended foods or beverages. Contact your  dietitian for more options.  +++++++++++++++  WHAT FOODS ARE NOT RECOMMENDED? Grains/ White flour or wheat flour White bread. White pasta. White rice. Refined cornbread. Bagels and croissants. Crackers that contain trans fat.  Vegetables  Creamed or fried vegetables. Vegetables in a . Regular canned vegetables. Regular canned tomato sauce and paste. Regular tomato and vegetable juices.  Fruits Dried fruits. Canned fruit in light or heavy syrup. Fruit juice.  Meat and Other Protein Products Meat in general - RED meat & White meat.  Fatty cuts of meat. Ribs, chicken wings, all processed meats as bacon, sausage, bologna, salami, fatback, hot dogs, bratwurst and packaged luncheon meats.  Dairy Whole or 2% milk, cream, half-and-half, and cream cheese. Whole-fat or sweetened yogurt. Full-fat cheeses or blue cheese. Non-dairy creamers and whipped toppings. Processed cheese, cheese spreads, or cheese curds.  Condiments Onion and garlic salt, seasoned salt, table salt, and sea salt. Canned and packaged gravies. Worcestershire sauce. Tartar sauce. Barbecue sauce. Teriyaki sauce. Soy sauce, including  reduced sodium. Steak sauce. Fish sauce. Oyster sauce. Cocktail sauce. Horseradish. Ketchup and mustard. Meat flavorings and tenderizers. Bouillon cubes. Hot sauce. Tabasco sauce. Marinades. Taco seasonings. Relishes.  Fats and Oils Butter, stick margarine, lard, shortening and bacon fat. Coconut, palm kernel, or palm oils. Regular salad dressings.  Pickles and olives. Salted popcorn and pretzels.  The items listed above may not be a complete list of foods and beverages to avoid.

## 2019-03-21 ENCOUNTER — Encounter: Payer: Self-pay | Admitting: Internal Medicine

## 2019-03-21 ENCOUNTER — Other Ambulatory Visit: Payer: Self-pay | Admitting: Internal Medicine

## 2019-03-21 DIAGNOSIS — N186 End stage renal disease: Secondary | ICD-10-CM | POA: Diagnosis not present

## 2019-03-21 DIAGNOSIS — R17 Unspecified jaundice: Secondary | ICD-10-CM | POA: Diagnosis not present

## 2019-03-21 DIAGNOSIS — D509 Iron deficiency anemia, unspecified: Secondary | ICD-10-CM | POA: Diagnosis not present

## 2019-03-21 DIAGNOSIS — Z23 Encounter for immunization: Secondary | ICD-10-CM | POA: Diagnosis not present

## 2019-03-21 DIAGNOSIS — E876 Hypokalemia: Secondary | ICD-10-CM

## 2019-03-21 DIAGNOSIS — D519 Vitamin B12 deficiency anemia, unspecified: Secondary | ICD-10-CM

## 2019-03-21 DIAGNOSIS — D649 Anemia, unspecified: Secondary | ICD-10-CM

## 2019-03-21 DIAGNOSIS — D631 Anemia in chronic kidney disease: Secondary | ICD-10-CM | POA: Diagnosis not present

## 2019-03-21 DIAGNOSIS — N2581 Secondary hyperparathyroidism of renal origin: Secondary | ICD-10-CM | POA: Diagnosis not present

## 2019-03-21 DIAGNOSIS — E440 Moderate protein-calorie malnutrition: Secondary | ICD-10-CM | POA: Diagnosis not present

## 2019-03-21 DIAGNOSIS — Z79899 Other long term (current) drug therapy: Secondary | ICD-10-CM | POA: Diagnosis not present

## 2019-03-21 DIAGNOSIS — R82998 Other abnormal findings in urine: Secondary | ICD-10-CM | POA: Diagnosis not present

## 2019-03-22 DIAGNOSIS — E876 Hypokalemia: Secondary | ICD-10-CM | POA: Diagnosis not present

## 2019-03-22 DIAGNOSIS — R17 Unspecified jaundice: Secondary | ICD-10-CM | POA: Diagnosis not present

## 2019-03-22 DIAGNOSIS — D509 Iron deficiency anemia, unspecified: Secondary | ICD-10-CM | POA: Diagnosis not present

## 2019-03-22 DIAGNOSIS — Z23 Encounter for immunization: Secondary | ICD-10-CM | POA: Diagnosis not present

## 2019-03-22 DIAGNOSIS — E440 Moderate protein-calorie malnutrition: Secondary | ICD-10-CM | POA: Diagnosis not present

## 2019-03-22 DIAGNOSIS — R82998 Other abnormal findings in urine: Secondary | ICD-10-CM | POA: Diagnosis not present

## 2019-03-22 DIAGNOSIS — D631 Anemia in chronic kidney disease: Secondary | ICD-10-CM | POA: Diagnosis not present

## 2019-03-22 DIAGNOSIS — N2581 Secondary hyperparathyroidism of renal origin: Secondary | ICD-10-CM | POA: Diagnosis not present

## 2019-03-22 DIAGNOSIS — Z79899 Other long term (current) drug therapy: Secondary | ICD-10-CM | POA: Diagnosis not present

## 2019-03-22 DIAGNOSIS — N186 End stage renal disease: Secondary | ICD-10-CM | POA: Diagnosis not present

## 2019-03-22 LAB — LIPID PANEL
Cholesterol: 200 mg/dL — ABNORMAL HIGH (ref <200)
HDL: 43 mg/dL — ABNORMAL LOW (ref >=50)
LDL Cholesterol (Calc): 131 mg/dL (calc) — ABNORMAL HIGH
Non-HDL Cholesterol (Calc): 157 mg/dL (calc) — ABNORMAL HIGH (ref <130)
Total CHOL/HDL Ratio: 4.7 (calc) (ref <5.0)
Triglycerides: 149 mg/dL (ref <150)

## 2019-03-22 LAB — CBC WITH DIFFERENTIAL/PLATELET
Absolute Monocytes: 540 cells/uL (ref 200–950)
Basophils Absolute: 28 cells/uL (ref 0–200)
Basophils Relative: 0.4 %
Eosinophils Absolute: 327 cells/uL (ref 15–500)
Eosinophils Relative: 4.6 %
HCT: 24.4 % — ABNORMAL LOW (ref 35.0–45.0)
Hemoglobin: 8.3 g/dL — ABNORMAL LOW (ref 11.7–15.5)
Lymphs Abs: 1882 cells/uL (ref 850–3900)
MCH: 32 pg (ref 27.0–33.0)
MCHC: 34 g/dL (ref 32.0–36.0)
MCV: 94.2 fL (ref 80.0–100.0)
MPV: 9.9 fL (ref 7.5–12.5)
Monocytes Relative: 7.6 %
Neutro Abs: 4324 cells/uL (ref 1500–7800)
Neutrophils Relative %: 60.9 %
Platelets: 296 10*3/uL (ref 140–400)
RBC: 2.59 10*6/uL — ABNORMAL LOW (ref 3.80–5.10)
RDW: 14.4 % (ref 11.0–15.0)
Total Lymphocyte: 26.5 %
WBC: 7.1 10*3/uL (ref 3.8–10.8)

## 2019-03-22 LAB — VITAMIN D 25 HYDROXY (VIT D DEFICIENCY, FRACTURES): Vit D, 25-Hydroxy: 41 ng/mL (ref 30–100)

## 2019-03-22 LAB — COMPLETE METABOLIC PANEL WITH GFR
AG Ratio: 1.3 (calc) (ref 1.0–2.5)
ALT: 18 U/L (ref 6–29)
AST: 23 U/L (ref 10–35)
Albumin: 3.2 g/dL — ABNORMAL LOW (ref 3.6–5.1)
Alkaline phosphatase (APISO): 108 U/L (ref 37–153)
BUN/Creatinine Ratio: 5 (calc) — ABNORMAL LOW (ref 6–22)
BUN: 46 mg/dL — ABNORMAL HIGH (ref 7–25)
CO2: 27 mmol/L (ref 20–32)
Calcium: 8 mg/dL — ABNORMAL LOW (ref 8.6–10.4)
Chloride: 90 mmol/L — ABNORMAL LOW (ref 98–110)
Creat: 8.67 mg/dL — ABNORMAL HIGH (ref 0.60–0.93)
GFR, Est African American: 5 mL/min/{1.73_m2} — ABNORMAL LOW (ref >=60)
GFR, Est Non African American: 4 mL/min/{1.73_m2} — ABNORMAL LOW (ref >=60)
Globulin: 2.5 g/dL (calc) (ref 1.9–3.7)
Glucose, Bld: 128 mg/dL — ABNORMAL HIGH (ref 65–99)
Potassium: 2.9 mmol/L — ABNORMAL LOW (ref 3.5–5.3)
Sodium: 132 mmol/L — ABNORMAL LOW (ref 135–146)
Total Bilirubin: 0.4 mg/dL (ref 0.2–1.2)
Total Protein: 5.7 g/dL — ABNORMAL LOW (ref 6.1–8.1)

## 2019-03-22 LAB — HEMOGLOBIN A1C
Hgb A1c MFr Bld: 6.5 % of total Hgb — ABNORMAL HIGH (ref <5.7)
Mean Plasma Glucose: 140 (calc)
eAG (mmol/L): 7.7 (calc)

## 2019-03-22 LAB — TSH: TSH: 2.6 mIU/L (ref 0.40–4.50)

## 2019-03-22 LAB — MAGNESIUM: Magnesium: 1.3 mg/dL — ABNORMAL LOW (ref 1.5–2.5)

## 2019-03-22 LAB — URIC ACID: Uric Acid, Serum: 3.3 mg/dL (ref 2.5–7.0)

## 2019-03-22 LAB — INSULIN, RANDOM: Insulin: 4.3 u[IU]/mL

## 2019-03-23 DIAGNOSIS — N2581 Secondary hyperparathyroidism of renal origin: Secondary | ICD-10-CM | POA: Diagnosis not present

## 2019-03-23 DIAGNOSIS — R17 Unspecified jaundice: Secondary | ICD-10-CM | POA: Diagnosis not present

## 2019-03-23 DIAGNOSIS — R82998 Other abnormal findings in urine: Secondary | ICD-10-CM | POA: Diagnosis not present

## 2019-03-23 DIAGNOSIS — E440 Moderate protein-calorie malnutrition: Secondary | ICD-10-CM | POA: Diagnosis not present

## 2019-03-23 DIAGNOSIS — D631 Anemia in chronic kidney disease: Secondary | ICD-10-CM | POA: Diagnosis not present

## 2019-03-23 DIAGNOSIS — Z23 Encounter for immunization: Secondary | ICD-10-CM | POA: Diagnosis not present

## 2019-03-23 DIAGNOSIS — N186 End stage renal disease: Secondary | ICD-10-CM | POA: Diagnosis not present

## 2019-03-23 DIAGNOSIS — Z79899 Other long term (current) drug therapy: Secondary | ICD-10-CM | POA: Diagnosis not present

## 2019-03-23 DIAGNOSIS — E876 Hypokalemia: Secondary | ICD-10-CM | POA: Diagnosis not present

## 2019-03-23 DIAGNOSIS — D509 Iron deficiency anemia, unspecified: Secondary | ICD-10-CM | POA: Diagnosis not present

## 2019-03-24 DIAGNOSIS — R17 Unspecified jaundice: Secondary | ICD-10-CM | POA: Diagnosis not present

## 2019-03-24 DIAGNOSIS — D631 Anemia in chronic kidney disease: Secondary | ICD-10-CM | POA: Diagnosis not present

## 2019-03-24 DIAGNOSIS — Z79899 Other long term (current) drug therapy: Secondary | ICD-10-CM | POA: Diagnosis not present

## 2019-03-24 DIAGNOSIS — Z23 Encounter for immunization: Secondary | ICD-10-CM | POA: Diagnosis not present

## 2019-03-24 DIAGNOSIS — N186 End stage renal disease: Secondary | ICD-10-CM | POA: Diagnosis not present

## 2019-03-24 DIAGNOSIS — R82998 Other abnormal findings in urine: Secondary | ICD-10-CM | POA: Diagnosis not present

## 2019-03-24 DIAGNOSIS — D509 Iron deficiency anemia, unspecified: Secondary | ICD-10-CM | POA: Diagnosis not present

## 2019-03-24 DIAGNOSIS — E440 Moderate protein-calorie malnutrition: Secondary | ICD-10-CM | POA: Diagnosis not present

## 2019-03-24 DIAGNOSIS — N2581 Secondary hyperparathyroidism of renal origin: Secondary | ICD-10-CM | POA: Diagnosis not present

## 2019-03-24 DIAGNOSIS — E876 Hypokalemia: Secondary | ICD-10-CM | POA: Diagnosis not present

## 2019-03-25 ENCOUNTER — Other Ambulatory Visit: Payer: Self-pay | Admitting: Internal Medicine

## 2019-03-25 ENCOUNTER — Ambulatory Visit (INDEPENDENT_AMBULATORY_CARE_PROVIDER_SITE_OTHER): Payer: Medicare Other

## 2019-03-25 ENCOUNTER — Other Ambulatory Visit: Payer: Self-pay

## 2019-03-25 DIAGNOSIS — D631 Anemia in chronic kidney disease: Secondary | ICD-10-CM | POA: Diagnosis not present

## 2019-03-25 DIAGNOSIS — N186 End stage renal disease: Secondary | ICD-10-CM | POA: Diagnosis not present

## 2019-03-25 DIAGNOSIS — D519 Vitamin B12 deficiency anemia, unspecified: Secondary | ICD-10-CM | POA: Diagnosis not present

## 2019-03-25 DIAGNOSIS — D649 Anemia, unspecified: Secondary | ICD-10-CM

## 2019-03-25 DIAGNOSIS — E876 Hypokalemia: Secondary | ICD-10-CM

## 2019-03-25 DIAGNOSIS — Z23 Encounter for immunization: Secondary | ICD-10-CM | POA: Diagnosis not present

## 2019-03-25 DIAGNOSIS — Z79899 Other long term (current) drug therapy: Secondary | ICD-10-CM | POA: Diagnosis not present

## 2019-03-25 DIAGNOSIS — R82998 Other abnormal findings in urine: Secondary | ICD-10-CM | POA: Diagnosis not present

## 2019-03-25 DIAGNOSIS — N2581 Secondary hyperparathyroidism of renal origin: Secondary | ICD-10-CM | POA: Diagnosis not present

## 2019-03-25 DIAGNOSIS — R17 Unspecified jaundice: Secondary | ICD-10-CM | POA: Diagnosis not present

## 2019-03-25 DIAGNOSIS — D509 Iron deficiency anemia, unspecified: Secondary | ICD-10-CM | POA: Diagnosis not present

## 2019-03-25 DIAGNOSIS — R300 Dysuria: Secondary | ICD-10-CM

## 2019-03-25 DIAGNOSIS — E440 Moderate protein-calorie malnutrition: Secondary | ICD-10-CM | POA: Diagnosis not present

## 2019-03-25 MED ORDER — POTASSIUM CHLORIDE CRYS ER 20 MEQ PO TBCR: EXTENDED_RELEASE_TABLET | ORAL | 0 refills | Status: DC

## 2019-03-25 NOTE — Progress Notes (Signed)
Patient reports for LAB  LABS were entered into Epic by PROVIDER Vitals entered into Epic as well. Patient was place in room to have blood drawn.

## 2019-03-25 NOTE — Addendum Note (Signed)
Addended by: Eulis Canner on: 03/25/2019 03:47 PM   Modules accepted: Orders

## 2019-03-25 NOTE — Addendum Note (Signed)
Addended by: Mirinda Amis D on: 03/25/2019 10:45 AM   Modules accepted: Level of Service

## 2019-03-26 ENCOUNTER — Other Ambulatory Visit: Payer: Self-pay | Admitting: Physician Assistant

## 2019-03-26 ENCOUNTER — Encounter: Payer: Self-pay | Admitting: *Deleted

## 2019-03-26 DIAGNOSIS — E876 Hypokalemia: Secondary | ICD-10-CM | POA: Diagnosis not present

## 2019-03-26 DIAGNOSIS — N186 End stage renal disease: Secondary | ICD-10-CM

## 2019-03-26 DIAGNOSIS — Z79899 Other long term (current) drug therapy: Secondary | ICD-10-CM | POA: Diagnosis not present

## 2019-03-26 DIAGNOSIS — D509 Iron deficiency anemia, unspecified: Secondary | ICD-10-CM | POA: Diagnosis not present

## 2019-03-26 DIAGNOSIS — E440 Moderate protein-calorie malnutrition: Secondary | ICD-10-CM | POA: Diagnosis not present

## 2019-03-26 DIAGNOSIS — R17 Unspecified jaundice: Secondary | ICD-10-CM | POA: Diagnosis not present

## 2019-03-26 DIAGNOSIS — R82998 Other abnormal findings in urine: Secondary | ICD-10-CM | POA: Diagnosis not present

## 2019-03-26 DIAGNOSIS — N398 Other specified disorders of urinary system: Secondary | ICD-10-CM

## 2019-03-26 DIAGNOSIS — Z23 Encounter for immunization: Secondary | ICD-10-CM | POA: Diagnosis not present

## 2019-03-26 DIAGNOSIS — D631 Anemia in chronic kidney disease: Secondary | ICD-10-CM | POA: Diagnosis not present

## 2019-03-26 DIAGNOSIS — N2581 Secondary hyperparathyroidism of renal origin: Secondary | ICD-10-CM | POA: Diagnosis not present

## 2019-03-27 ENCOUNTER — Ambulatory Visit (HOSPITAL_COMMUNITY)
Admission: RE | Admit: 2019-03-27 | Discharge: 2019-03-27 | Disposition: A | Discharge status: Home / Self Care | Payer: Medicare Other | Source: Ambulatory Visit | Attending: Physician Assistant | Admitting: Physician Assistant

## 2019-03-27 ENCOUNTER — Other Ambulatory Visit: Payer: Self-pay

## 2019-03-27 DIAGNOSIS — E440 Moderate protein-calorie malnutrition: Secondary | ICD-10-CM | POA: Diagnosis not present

## 2019-03-27 DIAGNOSIS — R17 Unspecified jaundice: Secondary | ICD-10-CM | POA: Diagnosis not present

## 2019-03-27 DIAGNOSIS — N398 Other specified disorders of urinary system: Secondary | ICD-10-CM | POA: Diagnosis not present

## 2019-03-27 DIAGNOSIS — N281 Cyst of kidney, acquired: Secondary | ICD-10-CM | POA: Diagnosis not present

## 2019-03-27 DIAGNOSIS — E876 Hypokalemia: Secondary | ICD-10-CM | POA: Diagnosis not present

## 2019-03-27 DIAGNOSIS — N186 End stage renal disease: Secondary | ICD-10-CM | POA: Insufficient documentation

## 2019-03-27 DIAGNOSIS — Z79899 Other long term (current) drug therapy: Secondary | ICD-10-CM | POA: Diagnosis not present

## 2019-03-27 DIAGNOSIS — N2581 Secondary hyperparathyroidism of renal origin: Secondary | ICD-10-CM | POA: Diagnosis not present

## 2019-03-27 DIAGNOSIS — R82998 Other abnormal findings in urine: Secondary | ICD-10-CM | POA: Diagnosis not present

## 2019-03-27 DIAGNOSIS — D631 Anemia in chronic kidney disease: Secondary | ICD-10-CM | POA: Diagnosis not present

## 2019-03-27 DIAGNOSIS — Z23 Encounter for immunization: Secondary | ICD-10-CM | POA: Diagnosis not present

## 2019-03-27 DIAGNOSIS — D509 Iron deficiency anemia, unspecified: Secondary | ICD-10-CM | POA: Diagnosis not present

## 2019-03-28 ENCOUNTER — Other Ambulatory Visit: Payer: Self-pay | Admitting: Internal Medicine

## 2019-03-28 DIAGNOSIS — N2581 Secondary hyperparathyroidism of renal origin: Secondary | ICD-10-CM | POA: Diagnosis not present

## 2019-03-28 DIAGNOSIS — R82998 Other abnormal findings in urine: Secondary | ICD-10-CM | POA: Diagnosis not present

## 2019-03-28 DIAGNOSIS — N186 End stage renal disease: Secondary | ICD-10-CM | POA: Diagnosis not present

## 2019-03-28 DIAGNOSIS — E876 Hypokalemia: Secondary | ICD-10-CM | POA: Diagnosis not present

## 2019-03-28 DIAGNOSIS — N390 Urinary tract infection, site not specified: Secondary | ICD-10-CM

## 2019-03-28 DIAGNOSIS — Z23 Encounter for immunization: Secondary | ICD-10-CM | POA: Diagnosis not present

## 2019-03-28 DIAGNOSIS — D631 Anemia in chronic kidney disease: Secondary | ICD-10-CM | POA: Diagnosis not present

## 2019-03-28 DIAGNOSIS — Z79899 Other long term (current) drug therapy: Secondary | ICD-10-CM | POA: Diagnosis not present

## 2019-03-28 DIAGNOSIS — E440 Moderate protein-calorie malnutrition: Secondary | ICD-10-CM | POA: Diagnosis not present

## 2019-03-28 DIAGNOSIS — R17 Unspecified jaundice: Secondary | ICD-10-CM | POA: Diagnosis not present

## 2019-03-28 DIAGNOSIS — D509 Iron deficiency anemia, unspecified: Secondary | ICD-10-CM | POA: Diagnosis not present

## 2019-03-28 LAB — URINALYSIS, ROUTINE W REFLEX MICROSCOPIC
Bilirubin Urine: NEGATIVE
Glucose, UA: NEGATIVE
Hyaline Cast: NONE SEEN /LPF
Ketones, ur: NEGATIVE
Nitrite: NEGATIVE
Specific Gravity, Urine: 1.007 (ref 1.001–1.03)
Squamous Epithelial / HPF: >=28 /HPF — AB (ref <=5)
WBC, UA: >=60 /HPF — AB (ref 0–5)
pH: 7 (ref 5.0–8.0)

## 2019-03-28 LAB — URINE CULTURE
MICRO NUMBER:: 447195
SPECIMEN QUALITY:: ADEQUATE

## 2019-03-28 MED ORDER — CIPROFLOXACIN HCL 250 MG PO TABS: ORAL_TABLET | ORAL | 0 refills | Status: DC

## 2019-03-29 DIAGNOSIS — E440 Moderate protein-calorie malnutrition: Secondary | ICD-10-CM | POA: Diagnosis not present

## 2019-03-29 DIAGNOSIS — Z23 Encounter for immunization: Secondary | ICD-10-CM | POA: Diagnosis not present

## 2019-03-29 DIAGNOSIS — R82998 Other abnormal findings in urine: Secondary | ICD-10-CM | POA: Diagnosis not present

## 2019-03-29 DIAGNOSIS — D631 Anemia in chronic kidney disease: Secondary | ICD-10-CM | POA: Diagnosis not present

## 2019-03-29 DIAGNOSIS — N186 End stage renal disease: Secondary | ICD-10-CM | POA: Diagnosis not present

## 2019-03-29 DIAGNOSIS — E876 Hypokalemia: Secondary | ICD-10-CM | POA: Diagnosis not present

## 2019-03-29 DIAGNOSIS — Z79899 Other long term (current) drug therapy: Secondary | ICD-10-CM | POA: Diagnosis not present

## 2019-03-29 DIAGNOSIS — R17 Unspecified jaundice: Secondary | ICD-10-CM | POA: Diagnosis not present

## 2019-03-29 DIAGNOSIS — N2581 Secondary hyperparathyroidism of renal origin: Secondary | ICD-10-CM | POA: Diagnosis not present

## 2019-03-29 DIAGNOSIS — D509 Iron deficiency anemia, unspecified: Secondary | ICD-10-CM | POA: Diagnosis not present

## 2019-03-30 DIAGNOSIS — Z79899 Other long term (current) drug therapy: Secondary | ICD-10-CM | POA: Diagnosis not present

## 2019-03-30 DIAGNOSIS — E440 Moderate protein-calorie malnutrition: Secondary | ICD-10-CM | POA: Diagnosis not present

## 2019-03-30 DIAGNOSIS — D631 Anemia in chronic kidney disease: Secondary | ICD-10-CM | POA: Diagnosis not present

## 2019-03-30 DIAGNOSIS — D509 Iron deficiency anemia, unspecified: Secondary | ICD-10-CM | POA: Diagnosis not present

## 2019-03-30 DIAGNOSIS — Z23 Encounter for immunization: Secondary | ICD-10-CM | POA: Diagnosis not present

## 2019-03-30 DIAGNOSIS — N2581 Secondary hyperparathyroidism of renal origin: Secondary | ICD-10-CM | POA: Diagnosis not present

## 2019-03-30 DIAGNOSIS — E876 Hypokalemia: Secondary | ICD-10-CM | POA: Diagnosis not present

## 2019-03-30 DIAGNOSIS — R17 Unspecified jaundice: Secondary | ICD-10-CM | POA: Diagnosis not present

## 2019-03-30 DIAGNOSIS — N186 End stage renal disease: Secondary | ICD-10-CM | POA: Diagnosis not present

## 2019-03-30 DIAGNOSIS — R82998 Other abnormal findings in urine: Secondary | ICD-10-CM | POA: Diagnosis not present

## 2019-03-30 LAB — FERRITIN: Ferritin: 1467 ng/mL — ABNORMAL HIGH (ref 16–288)

## 2019-03-30 LAB — COMPLETE METABOLIC PANEL WITH GFR
AG Ratio: 1.3 (calc) (ref 1.0–2.5)
ALT: 17 U/L (ref 6–29)
AST: 20 U/L (ref 10–35)
Albumin: 3.4 g/dL — ABNORMAL LOW (ref 3.6–5.1)
Alkaline phosphatase (APISO): 125 U/L (ref 37–153)
BUN/Creatinine Ratio: 4 (calc) — ABNORMAL LOW (ref 6–22)
BUN: 37 mg/dL — ABNORMAL HIGH (ref 7–25)
CO2: 29 mmol/L (ref 20–32)
Calcium: 8.8 mg/dL (ref 8.6–10.4)
Chloride: 91 mmol/L — ABNORMAL LOW (ref 98–110)
Creat: 9.54 mg/dL — ABNORMAL HIGH (ref 0.60–0.93)
GFR, Est African American: 4 mL/min/{1.73_m2} — ABNORMAL LOW (ref >=60)
GFR, Est Non African American: 4 mL/min/{1.73_m2} — ABNORMAL LOW (ref >=60)
Globulin: 2.7 g/dL (calc) (ref 1.9–3.7)
Glucose, Bld: 118 mg/dL — ABNORMAL HIGH (ref 65–99)
Potassium: 2.9 mmol/L — ABNORMAL LOW (ref 3.5–5.3)
Sodium: 135 mmol/L (ref 135–146)
Total Bilirubin: 0.4 mg/dL (ref 0.2–1.2)
Total Protein: 6.1 g/dL (ref 6.1–8.1)

## 2019-03-30 LAB — CBC WITH DIFFERENTIAL/PLATELET
Absolute Monocytes: 482 cells/uL (ref 200–950)
Basophils Absolute: 20 cells/uL (ref 0–200)
Basophils Relative: 0.3 %
Eosinophils Absolute: 369 cells/uL (ref 15–500)
Eosinophils Relative: 5.5 %
HCT: 26.5 % — ABNORMAL LOW (ref 35.0–45.0)
Hemoglobin: 9 g/dL — ABNORMAL LOW (ref 11.7–15.5)
Lymphs Abs: 1715 cells/uL (ref 850–3900)
MCH: 31.9 pg (ref 27.0–33.0)
MCHC: 34 g/dL (ref 32.0–36.0)
MCV: 94 fL (ref 80.0–100.0)
MPV: 10 fL (ref 7.5–12.5)
Monocytes Relative: 7.2 %
Neutro Abs: 4114 cells/uL (ref 1500–7800)
Neutrophils Relative %: 61.4 %
Platelets: 290 10*3/uL (ref 140–400)
RBC: 2.82 10*6/uL — ABNORMAL LOW (ref 3.80–5.10)
RDW: 14.2 % (ref 11.0–15.0)
Total Lymphocyte: 25.6 %
WBC: 6.7 10*3/uL (ref 3.8–10.8)

## 2019-03-30 LAB — MAGNESIUM: Magnesium: 1.5 mg/dL (ref 1.5–2.5)

## 2019-03-30 LAB — IRON, TOTAL/TOTAL IRON BINDING CAP
%SAT: 42 % (calc) (ref 16–45)
TIBC: 215 mcg/dL (calc) — ABNORMAL LOW (ref 250–450)

## 2019-03-30 LAB — RETICULOCYTES
ABS Retic: 31020 cells/uL (ref 20000–8000)
Retic Ct Pct: 1.1 %

## 2019-03-30 LAB — IRON,?TOTAL/TOTAL IRON BINDING CAP: Iron: 90 ug/dL (ref 45–160)

## 2019-03-30 LAB — VITAMIN B12: Vitamin B-12: 1037 pg/mL (ref 200–1100)

## 2019-03-31 ENCOUNTER — Other Ambulatory Visit: Payer: Self-pay | Admitting: Physician Assistant

## 2019-03-31 DIAGNOSIS — N186 End stage renal disease: Secondary | ICD-10-CM | POA: Diagnosis not present

## 2019-03-31 DIAGNOSIS — R17 Unspecified jaundice: Secondary | ICD-10-CM | POA: Diagnosis not present

## 2019-03-31 DIAGNOSIS — R82998 Other abnormal findings in urine: Secondary | ICD-10-CM | POA: Diagnosis not present

## 2019-03-31 DIAGNOSIS — E440 Moderate protein-calorie malnutrition: Secondary | ICD-10-CM | POA: Diagnosis not present

## 2019-03-31 DIAGNOSIS — E876 Hypokalemia: Secondary | ICD-10-CM | POA: Diagnosis not present

## 2019-03-31 DIAGNOSIS — D509 Iron deficiency anemia, unspecified: Secondary | ICD-10-CM | POA: Diagnosis not present

## 2019-03-31 DIAGNOSIS — N2581 Secondary hyperparathyroidism of renal origin: Secondary | ICD-10-CM | POA: Diagnosis not present

## 2019-03-31 DIAGNOSIS — Z23 Encounter for immunization: Secondary | ICD-10-CM | POA: Diagnosis not present

## 2019-03-31 DIAGNOSIS — Z79899 Other long term (current) drug therapy: Secondary | ICD-10-CM | POA: Diagnosis not present

## 2019-03-31 DIAGNOSIS — D631 Anemia in chronic kidney disease: Secondary | ICD-10-CM | POA: Diagnosis not present

## 2019-03-31 MED ORDER — CEPHALEXIN 500 MG PO CAPS: 500.0000 mg | ORAL_CAPSULE | Freq: Two times a day (BID) | ORAL | 0 refills | Status: DC

## 2019-04-01 ENCOUNTER — Other Ambulatory Visit (INDEPENDENT_AMBULATORY_CARE_PROVIDER_SITE_OTHER): Payer: Medicare Other

## 2019-04-01 DIAGNOSIS — Z1211 Encounter for screening for malignant neoplasm of colon: Secondary | ICD-10-CM

## 2019-04-01 DIAGNOSIS — Z23 Encounter for immunization: Secondary | ICD-10-CM | POA: Diagnosis not present

## 2019-04-01 DIAGNOSIS — D631 Anemia in chronic kidney disease: Secondary | ICD-10-CM | POA: Diagnosis not present

## 2019-04-01 DIAGNOSIS — N2581 Secondary hyperparathyroidism of renal origin: Secondary | ICD-10-CM | POA: Diagnosis not present

## 2019-04-01 DIAGNOSIS — R17 Unspecified jaundice: Secondary | ICD-10-CM | POA: Diagnosis not present

## 2019-04-01 DIAGNOSIS — E876 Hypokalemia: Secondary | ICD-10-CM | POA: Diagnosis not present

## 2019-04-01 DIAGNOSIS — Z79899 Other long term (current) drug therapy: Secondary | ICD-10-CM | POA: Diagnosis not present

## 2019-04-01 DIAGNOSIS — Z1212 Encounter for screening for malignant neoplasm of rectum: Secondary | ICD-10-CM

## 2019-04-01 DIAGNOSIS — E440 Moderate protein-calorie malnutrition: Secondary | ICD-10-CM | POA: Diagnosis not present

## 2019-04-01 DIAGNOSIS — R82998 Other abnormal findings in urine: Secondary | ICD-10-CM | POA: Diagnosis not present

## 2019-04-01 DIAGNOSIS — D509 Iron deficiency anemia, unspecified: Secondary | ICD-10-CM | POA: Diagnosis not present

## 2019-04-01 DIAGNOSIS — N186 End stage renal disease: Secondary | ICD-10-CM | POA: Diagnosis not present

## 2019-04-01 LAB — POC HEMOCCULT BLD/STL (HOME/3-CARD/SCREEN)
Card #2 Fecal Occult Blod, POC: NEGATIVE
Card #3 Fecal Occult Blood, POC: NEGATIVE
Fecal Occult Blood, POC: NEGATIVE

## 2019-04-02 DIAGNOSIS — Z1211 Encounter for screening for malignant neoplasm of colon: Secondary | ICD-10-CM | POA: Diagnosis not present

## 2019-04-02 DIAGNOSIS — Z79899 Other long term (current) drug therapy: Secondary | ICD-10-CM | POA: Diagnosis not present

## 2019-04-02 DIAGNOSIS — D509 Iron deficiency anemia, unspecified: Secondary | ICD-10-CM | POA: Diagnosis not present

## 2019-04-02 DIAGNOSIS — Z23 Encounter for immunization: Secondary | ICD-10-CM | POA: Diagnosis not present

## 2019-04-02 DIAGNOSIS — E440 Moderate protein-calorie malnutrition: Secondary | ICD-10-CM | POA: Diagnosis not present

## 2019-04-02 DIAGNOSIS — R82998 Other abnormal findings in urine: Secondary | ICD-10-CM | POA: Diagnosis not present

## 2019-04-02 DIAGNOSIS — N2581 Secondary hyperparathyroidism of renal origin: Secondary | ICD-10-CM | POA: Diagnosis not present

## 2019-04-02 DIAGNOSIS — D631 Anemia in chronic kidney disease: Secondary | ICD-10-CM | POA: Diagnosis not present

## 2019-04-02 DIAGNOSIS — E876 Hypokalemia: Secondary | ICD-10-CM | POA: Diagnosis not present

## 2019-04-02 DIAGNOSIS — D582 Other hemoglobinopathies: Secondary | ICD-10-CM | POA: Diagnosis not present

## 2019-04-02 DIAGNOSIS — R17 Unspecified jaundice: Secondary | ICD-10-CM | POA: Diagnosis not present

## 2019-04-02 DIAGNOSIS — N186 End stage renal disease: Secondary | ICD-10-CM | POA: Diagnosis not present

## 2019-04-03 DIAGNOSIS — Z79899 Other long term (current) drug therapy: Secondary | ICD-10-CM | POA: Diagnosis not present

## 2019-04-03 DIAGNOSIS — N186 End stage renal disease: Secondary | ICD-10-CM | POA: Diagnosis not present

## 2019-04-03 DIAGNOSIS — R17 Unspecified jaundice: Secondary | ICD-10-CM | POA: Diagnosis not present

## 2019-04-03 DIAGNOSIS — R82998 Other abnormal findings in urine: Secondary | ICD-10-CM | POA: Diagnosis not present

## 2019-04-03 DIAGNOSIS — D631 Anemia in chronic kidney disease: Secondary | ICD-10-CM | POA: Diagnosis not present

## 2019-04-03 DIAGNOSIS — N2581 Secondary hyperparathyroidism of renal origin: Secondary | ICD-10-CM | POA: Diagnosis not present

## 2019-04-03 DIAGNOSIS — Z23 Encounter for immunization: Secondary | ICD-10-CM | POA: Diagnosis not present

## 2019-04-03 DIAGNOSIS — E440 Moderate protein-calorie malnutrition: Secondary | ICD-10-CM | POA: Diagnosis not present

## 2019-04-03 DIAGNOSIS — D509 Iron deficiency anemia, unspecified: Secondary | ICD-10-CM | POA: Diagnosis not present

## 2019-04-03 DIAGNOSIS — E876 Hypokalemia: Secondary | ICD-10-CM | POA: Diagnosis not present

## 2019-04-04 DIAGNOSIS — D631 Anemia in chronic kidney disease: Secondary | ICD-10-CM | POA: Diagnosis not present

## 2019-04-04 DIAGNOSIS — N186 End stage renal disease: Secondary | ICD-10-CM | POA: Diagnosis not present

## 2019-04-04 DIAGNOSIS — N2581 Secondary hyperparathyroidism of renal origin: Secondary | ICD-10-CM | POA: Diagnosis not present

## 2019-04-04 DIAGNOSIS — Z79899 Other long term (current) drug therapy: Secondary | ICD-10-CM | POA: Diagnosis not present

## 2019-04-04 DIAGNOSIS — R17 Unspecified jaundice: Secondary | ICD-10-CM | POA: Diagnosis not present

## 2019-04-04 DIAGNOSIS — Z23 Encounter for immunization: Secondary | ICD-10-CM | POA: Diagnosis not present

## 2019-04-04 DIAGNOSIS — E440 Moderate protein-calorie malnutrition: Secondary | ICD-10-CM | POA: Diagnosis not present

## 2019-04-04 DIAGNOSIS — D509 Iron deficiency anemia, unspecified: Secondary | ICD-10-CM | POA: Diagnosis not present

## 2019-04-04 DIAGNOSIS — R82998 Other abnormal findings in urine: Secondary | ICD-10-CM | POA: Diagnosis not present

## 2019-04-04 DIAGNOSIS — E876 Hypokalemia: Secondary | ICD-10-CM | POA: Diagnosis not present

## 2019-04-05 DIAGNOSIS — R82998 Other abnormal findings in urine: Secondary | ICD-10-CM | POA: Diagnosis not present

## 2019-04-05 DIAGNOSIS — N186 End stage renal disease: Secondary | ICD-10-CM | POA: Diagnosis not present

## 2019-04-05 DIAGNOSIS — R17 Unspecified jaundice: Secondary | ICD-10-CM | POA: Diagnosis not present

## 2019-04-05 DIAGNOSIS — Z79899 Other long term (current) drug therapy: Secondary | ICD-10-CM | POA: Diagnosis not present

## 2019-04-05 DIAGNOSIS — D509 Iron deficiency anemia, unspecified: Secondary | ICD-10-CM | POA: Diagnosis not present

## 2019-04-05 DIAGNOSIS — E440 Moderate protein-calorie malnutrition: Secondary | ICD-10-CM | POA: Diagnosis not present

## 2019-04-05 DIAGNOSIS — Z23 Encounter for immunization: Secondary | ICD-10-CM | POA: Diagnosis not present

## 2019-04-05 DIAGNOSIS — E876 Hypokalemia: Secondary | ICD-10-CM | POA: Diagnosis not present

## 2019-04-05 DIAGNOSIS — D631 Anemia in chronic kidney disease: Secondary | ICD-10-CM | POA: Diagnosis not present

## 2019-04-05 DIAGNOSIS — N2581 Secondary hyperparathyroidism of renal origin: Secondary | ICD-10-CM | POA: Diagnosis not present

## 2019-04-06 DIAGNOSIS — E876 Hypokalemia: Secondary | ICD-10-CM | POA: Diagnosis not present

## 2019-04-06 DIAGNOSIS — R82998 Other abnormal findings in urine: Secondary | ICD-10-CM | POA: Diagnosis not present

## 2019-04-06 DIAGNOSIS — N186 End stage renal disease: Secondary | ICD-10-CM | POA: Diagnosis not present

## 2019-04-06 DIAGNOSIS — Z23 Encounter for immunization: Secondary | ICD-10-CM | POA: Diagnosis not present

## 2019-04-06 DIAGNOSIS — R17 Unspecified jaundice: Secondary | ICD-10-CM | POA: Diagnosis not present

## 2019-04-06 DIAGNOSIS — E440 Moderate protein-calorie malnutrition: Secondary | ICD-10-CM | POA: Diagnosis not present

## 2019-04-06 DIAGNOSIS — D631 Anemia in chronic kidney disease: Secondary | ICD-10-CM | POA: Diagnosis not present

## 2019-04-06 DIAGNOSIS — Z79899 Other long term (current) drug therapy: Secondary | ICD-10-CM | POA: Diagnosis not present

## 2019-04-06 DIAGNOSIS — N2581 Secondary hyperparathyroidism of renal origin: Secondary | ICD-10-CM | POA: Diagnosis not present

## 2019-04-06 DIAGNOSIS — D509 Iron deficiency anemia, unspecified: Secondary | ICD-10-CM | POA: Diagnosis not present

## 2019-04-07 DIAGNOSIS — N2581 Secondary hyperparathyroidism of renal origin: Secondary | ICD-10-CM | POA: Diagnosis not present

## 2019-04-07 DIAGNOSIS — E876 Hypokalemia: Secondary | ICD-10-CM | POA: Diagnosis not present

## 2019-04-07 DIAGNOSIS — D631 Anemia in chronic kidney disease: Secondary | ICD-10-CM | POA: Diagnosis not present

## 2019-04-07 DIAGNOSIS — Z23 Encounter for immunization: Secondary | ICD-10-CM | POA: Diagnosis not present

## 2019-04-07 DIAGNOSIS — N186 End stage renal disease: Secondary | ICD-10-CM | POA: Diagnosis not present

## 2019-04-07 DIAGNOSIS — E440 Moderate protein-calorie malnutrition: Secondary | ICD-10-CM | POA: Diagnosis not present

## 2019-04-07 DIAGNOSIS — D509 Iron deficiency anemia, unspecified: Secondary | ICD-10-CM | POA: Diagnosis not present

## 2019-04-07 DIAGNOSIS — R82998 Other abnormal findings in urine: Secondary | ICD-10-CM | POA: Diagnosis not present

## 2019-04-07 DIAGNOSIS — R17 Unspecified jaundice: Secondary | ICD-10-CM | POA: Diagnosis not present

## 2019-04-07 DIAGNOSIS — Z79899 Other long term (current) drug therapy: Secondary | ICD-10-CM | POA: Diagnosis not present

## 2019-04-08 DIAGNOSIS — Z79899 Other long term (current) drug therapy: Secondary | ICD-10-CM | POA: Diagnosis not present

## 2019-04-08 DIAGNOSIS — R82998 Other abnormal findings in urine: Secondary | ICD-10-CM | POA: Diagnosis not present

## 2019-04-08 DIAGNOSIS — Z23 Encounter for immunization: Secondary | ICD-10-CM | POA: Diagnosis not present

## 2019-04-08 DIAGNOSIS — D631 Anemia in chronic kidney disease: Secondary | ICD-10-CM | POA: Diagnosis not present

## 2019-04-08 DIAGNOSIS — R17 Unspecified jaundice: Secondary | ICD-10-CM | POA: Diagnosis not present

## 2019-04-08 DIAGNOSIS — N2581 Secondary hyperparathyroidism of renal origin: Secondary | ICD-10-CM | POA: Diagnosis not present

## 2019-04-08 DIAGNOSIS — N186 End stage renal disease: Secondary | ICD-10-CM | POA: Diagnosis not present

## 2019-04-08 DIAGNOSIS — D509 Iron deficiency anemia, unspecified: Secondary | ICD-10-CM | POA: Diagnosis not present

## 2019-04-08 DIAGNOSIS — E876 Hypokalemia: Secondary | ICD-10-CM | POA: Diagnosis not present

## 2019-04-08 DIAGNOSIS — E440 Moderate protein-calorie malnutrition: Secondary | ICD-10-CM | POA: Diagnosis not present

## 2019-04-08 NOTE — Addendum Note (Signed)
Addended by: Unk Pinto on: 04/08/2019 11:16 AM   Modules accepted: Orders

## 2019-04-09 DIAGNOSIS — R82998 Other abnormal findings in urine: Secondary | ICD-10-CM | POA: Diagnosis not present

## 2019-04-09 DIAGNOSIS — Z23 Encounter for immunization: Secondary | ICD-10-CM | POA: Diagnosis not present

## 2019-04-09 DIAGNOSIS — N186 End stage renal disease: Secondary | ICD-10-CM | POA: Diagnosis not present

## 2019-04-09 DIAGNOSIS — R17 Unspecified jaundice: Secondary | ICD-10-CM | POA: Diagnosis not present

## 2019-04-09 DIAGNOSIS — E876 Hypokalemia: Secondary | ICD-10-CM | POA: Diagnosis not present

## 2019-04-09 DIAGNOSIS — Z79899 Other long term (current) drug therapy: Secondary | ICD-10-CM | POA: Diagnosis not present

## 2019-04-09 DIAGNOSIS — D631 Anemia in chronic kidney disease: Secondary | ICD-10-CM | POA: Diagnosis not present

## 2019-04-09 DIAGNOSIS — N2581 Secondary hyperparathyroidism of renal origin: Secondary | ICD-10-CM | POA: Diagnosis not present

## 2019-04-09 DIAGNOSIS — D509 Iron deficiency anemia, unspecified: Secondary | ICD-10-CM | POA: Diagnosis not present

## 2019-04-09 DIAGNOSIS — E440 Moderate protein-calorie malnutrition: Secondary | ICD-10-CM | POA: Diagnosis not present

## 2019-04-10 ENCOUNTER — Telehealth: Payer: Self-pay

## 2019-04-10 DIAGNOSIS — N2581 Secondary hyperparathyroidism of renal origin: Secondary | ICD-10-CM | POA: Diagnosis not present

## 2019-04-10 DIAGNOSIS — D631 Anemia in chronic kidney disease: Secondary | ICD-10-CM | POA: Diagnosis not present

## 2019-04-10 DIAGNOSIS — N186 End stage renal disease: Secondary | ICD-10-CM | POA: Diagnosis not present

## 2019-04-10 DIAGNOSIS — E440 Moderate protein-calorie malnutrition: Secondary | ICD-10-CM | POA: Diagnosis not present

## 2019-04-10 DIAGNOSIS — E876 Hypokalemia: Secondary | ICD-10-CM | POA: Diagnosis not present

## 2019-04-10 DIAGNOSIS — R82998 Other abnormal findings in urine: Secondary | ICD-10-CM | POA: Diagnosis not present

## 2019-04-10 DIAGNOSIS — Z23 Encounter for immunization: Secondary | ICD-10-CM | POA: Diagnosis not present

## 2019-04-10 DIAGNOSIS — D509 Iron deficiency anemia, unspecified: Secondary | ICD-10-CM | POA: Diagnosis not present

## 2019-04-10 DIAGNOSIS — Z79899 Other long term (current) drug therapy: Secondary | ICD-10-CM | POA: Diagnosis not present

## 2019-04-10 DIAGNOSIS — R17 Unspecified jaundice: Secondary | ICD-10-CM | POA: Diagnosis not present
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
## 2019-04-11 DIAGNOSIS — Z79899 Other long term (current) drug therapy: Secondary | ICD-10-CM | POA: Diagnosis not present

## 2019-04-11 DIAGNOSIS — N186 End stage renal disease: Secondary | ICD-10-CM | POA: Diagnosis not present

## 2019-04-11 DIAGNOSIS — Z23 Encounter for immunization: Secondary | ICD-10-CM | POA: Diagnosis not present

## 2019-04-11 DIAGNOSIS — D631 Anemia in chronic kidney disease: Secondary | ICD-10-CM | POA: Diagnosis not present

## 2019-04-11 DIAGNOSIS — R82998 Other abnormal findings in urine: Secondary | ICD-10-CM | POA: Diagnosis not present

## 2019-04-11 DIAGNOSIS — D509 Iron deficiency anemia, unspecified: Secondary | ICD-10-CM | POA: Diagnosis not present

## 2019-04-11 DIAGNOSIS — N2581 Secondary hyperparathyroidism of renal origin: Secondary | ICD-10-CM | POA: Diagnosis not present

## 2019-04-11 DIAGNOSIS — E876 Hypokalemia: Secondary | ICD-10-CM | POA: Diagnosis not present

## 2019-04-11 DIAGNOSIS — R17 Unspecified jaundice: Secondary | ICD-10-CM | POA: Diagnosis not present

## 2019-04-11 DIAGNOSIS — E440 Moderate protein-calorie malnutrition: Secondary | ICD-10-CM | POA: Diagnosis not present

## 2019-04-12 DIAGNOSIS — D509 Iron deficiency anemia, unspecified: Secondary | ICD-10-CM | POA: Diagnosis not present

## 2019-04-12 DIAGNOSIS — E876 Hypokalemia: Secondary | ICD-10-CM | POA: Diagnosis not present

## 2019-04-12 DIAGNOSIS — N186 End stage renal disease: Secondary | ICD-10-CM | POA: Diagnosis not present

## 2019-04-12 DIAGNOSIS — Z79899 Other long term (current) drug therapy: Secondary | ICD-10-CM | POA: Diagnosis not present

## 2019-04-12 DIAGNOSIS — R82998 Other abnormal findings in urine: Secondary | ICD-10-CM | POA: Diagnosis not present

## 2019-04-12 DIAGNOSIS — E440 Moderate protein-calorie malnutrition: Secondary | ICD-10-CM | POA: Diagnosis not present

## 2019-04-12 DIAGNOSIS — R17 Unspecified jaundice: Secondary | ICD-10-CM | POA: Diagnosis not present

## 2019-04-12 DIAGNOSIS — Z23 Encounter for immunization: Secondary | ICD-10-CM | POA: Diagnosis not present

## 2019-04-12 DIAGNOSIS — N2581 Secondary hyperparathyroidism of renal origin: Secondary | ICD-10-CM | POA: Diagnosis not present

## 2019-04-12 DIAGNOSIS — D631 Anemia in chronic kidney disease: Secondary | ICD-10-CM | POA: Diagnosis not present

## 2019-04-13 DIAGNOSIS — Z79899 Other long term (current) drug therapy: Secondary | ICD-10-CM | POA: Diagnosis not present

## 2019-04-13 DIAGNOSIS — E876 Hypokalemia: Secondary | ICD-10-CM | POA: Diagnosis not present

## 2019-04-13 DIAGNOSIS — E440 Moderate protein-calorie malnutrition: Secondary | ICD-10-CM | POA: Diagnosis not present

## 2019-04-13 DIAGNOSIS — Z23 Encounter for immunization: Secondary | ICD-10-CM | POA: Diagnosis not present

## 2019-04-13 DIAGNOSIS — N2581 Secondary hyperparathyroidism of renal origin: Secondary | ICD-10-CM | POA: Diagnosis not present

## 2019-04-13 DIAGNOSIS — R17 Unspecified jaundice: Secondary | ICD-10-CM | POA: Diagnosis not present

## 2019-04-13 DIAGNOSIS — R82998 Other abnormal findings in urine: Secondary | ICD-10-CM | POA: Diagnosis not present

## 2019-04-13 DIAGNOSIS — D631 Anemia in chronic kidney disease: Secondary | ICD-10-CM | POA: Diagnosis not present

## 2019-04-13 DIAGNOSIS — N186 End stage renal disease: Secondary | ICD-10-CM | POA: Diagnosis not present

## 2019-04-13 DIAGNOSIS — D509 Iron deficiency anemia, unspecified: Secondary | ICD-10-CM | POA: Diagnosis not present

## 2019-04-14 DIAGNOSIS — E440 Moderate protein-calorie malnutrition: Secondary | ICD-10-CM | POA: Diagnosis not present

## 2019-04-14 DIAGNOSIS — D509 Iron deficiency anemia, unspecified: Secondary | ICD-10-CM | POA: Diagnosis not present

## 2019-04-14 DIAGNOSIS — Z23 Encounter for immunization: Secondary | ICD-10-CM | POA: Diagnosis not present

## 2019-04-14 DIAGNOSIS — N186 End stage renal disease: Secondary | ICD-10-CM | POA: Diagnosis not present

## 2019-04-14 DIAGNOSIS — Z79899 Other long term (current) drug therapy: Secondary | ICD-10-CM | POA: Diagnosis not present

## 2019-04-14 DIAGNOSIS — E876 Hypokalemia: Secondary | ICD-10-CM | POA: Diagnosis not present

## 2019-04-14 DIAGNOSIS — R17 Unspecified jaundice: Secondary | ICD-10-CM | POA: Diagnosis not present

## 2019-04-14 DIAGNOSIS — N2581 Secondary hyperparathyroidism of renal origin: Secondary | ICD-10-CM | POA: Diagnosis not present

## 2019-04-14 DIAGNOSIS — D631 Anemia in chronic kidney disease: Secondary | ICD-10-CM | POA: Diagnosis not present

## 2019-04-14 DIAGNOSIS — R82998 Other abnormal findings in urine: Secondary | ICD-10-CM | POA: Diagnosis not present

## 2019-04-15 DIAGNOSIS — N186 End stage renal disease: Secondary | ICD-10-CM | POA: Diagnosis not present

## 2019-04-15 DIAGNOSIS — R82998 Other abnormal findings in urine: Secondary | ICD-10-CM | POA: Diagnosis not present

## 2019-04-15 DIAGNOSIS — N2581 Secondary hyperparathyroidism of renal origin: Secondary | ICD-10-CM | POA: Diagnosis not present

## 2019-04-15 DIAGNOSIS — Z23 Encounter for immunization: Secondary | ICD-10-CM | POA: Diagnosis not present

## 2019-04-15 DIAGNOSIS — E440 Moderate protein-calorie malnutrition: Secondary | ICD-10-CM | POA: Diagnosis not present

## 2019-04-15 DIAGNOSIS — R17 Unspecified jaundice: Secondary | ICD-10-CM | POA: Diagnosis not present

## 2019-04-15 DIAGNOSIS — Z79899 Other long term (current) drug therapy: Secondary | ICD-10-CM | POA: Diagnosis not present

## 2019-04-15 DIAGNOSIS — D509 Iron deficiency anemia, unspecified: Secondary | ICD-10-CM | POA: Diagnosis not present

## 2019-04-15 DIAGNOSIS — D631 Anemia in chronic kidney disease: Secondary | ICD-10-CM | POA: Diagnosis not present

## 2019-04-15 DIAGNOSIS — E876 Hypokalemia: Secondary | ICD-10-CM | POA: Diagnosis not present

## 2019-04-16 DIAGNOSIS — R82998 Other abnormal findings in urine: Secondary | ICD-10-CM | POA: Diagnosis not present

## 2019-04-16 DIAGNOSIS — E876 Hypokalemia: Secondary | ICD-10-CM | POA: Diagnosis not present

## 2019-04-16 DIAGNOSIS — E440 Moderate protein-calorie malnutrition: Secondary | ICD-10-CM | POA: Diagnosis not present

## 2019-04-16 DIAGNOSIS — Z79899 Other long term (current) drug therapy: Secondary | ICD-10-CM | POA: Diagnosis not present

## 2019-04-16 DIAGNOSIS — D631 Anemia in chronic kidney disease: Secondary | ICD-10-CM | POA: Diagnosis not present

## 2019-04-16 DIAGNOSIS — N186 End stage renal disease: Secondary | ICD-10-CM | POA: Diagnosis not present

## 2019-04-16 DIAGNOSIS — D509 Iron deficiency anemia, unspecified: Secondary | ICD-10-CM | POA: Diagnosis not present

## 2019-04-16 DIAGNOSIS — Z23 Encounter for immunization: Secondary | ICD-10-CM | POA: Diagnosis not present

## 2019-04-16 DIAGNOSIS — N2581 Secondary hyperparathyroidism of renal origin: Secondary | ICD-10-CM | POA: Diagnosis not present

## 2019-04-16 DIAGNOSIS — R17 Unspecified jaundice: Secondary | ICD-10-CM | POA: Diagnosis not present

## 2019-04-17 ENCOUNTER — Other Ambulatory Visit: Payer: Self-pay | Admitting: Gastroenterology

## 2019-04-17 DIAGNOSIS — D631 Anemia in chronic kidney disease: Secondary | ICD-10-CM | POA: Diagnosis not present

## 2019-04-17 DIAGNOSIS — D509 Iron deficiency anemia, unspecified: Secondary | ICD-10-CM | POA: Diagnosis not present

## 2019-04-17 DIAGNOSIS — N2581 Secondary hyperparathyroidism of renal origin: Secondary | ICD-10-CM | POA: Diagnosis not present

## 2019-04-17 DIAGNOSIS — E876 Hypokalemia: Secondary | ICD-10-CM | POA: Diagnosis not present

## 2019-04-17 DIAGNOSIS — R17 Unspecified jaundice: Secondary | ICD-10-CM | POA: Diagnosis not present

## 2019-04-17 DIAGNOSIS — E440 Moderate protein-calorie malnutrition: Secondary | ICD-10-CM | POA: Diagnosis not present

## 2019-04-17 DIAGNOSIS — Z79899 Other long term (current) drug therapy: Secondary | ICD-10-CM | POA: Diagnosis not present

## 2019-04-17 DIAGNOSIS — R82998 Other abnormal findings in urine: Secondary | ICD-10-CM | POA: Diagnosis not present

## 2019-04-17 DIAGNOSIS — Z23 Encounter for immunization: Secondary | ICD-10-CM | POA: Diagnosis not present

## 2019-04-17 DIAGNOSIS — N186 End stage renal disease: Secondary | ICD-10-CM | POA: Diagnosis not present

## 2019-04-18 ENCOUNTER — Other Ambulatory Visit: Payer: Self-pay | Admitting: Gastroenterology

## 2019-04-18 DIAGNOSIS — E876 Hypokalemia: Secondary | ICD-10-CM | POA: Diagnosis not present

## 2019-04-18 DIAGNOSIS — Z23 Encounter for immunization: Secondary | ICD-10-CM | POA: Diagnosis not present

## 2019-04-18 DIAGNOSIS — N2581 Secondary hyperparathyroidism of renal origin: Secondary | ICD-10-CM | POA: Diagnosis not present

## 2019-04-18 DIAGNOSIS — R82998 Other abnormal findings in urine: Secondary | ICD-10-CM | POA: Diagnosis not present

## 2019-04-18 DIAGNOSIS — D631 Anemia in chronic kidney disease: Secondary | ICD-10-CM | POA: Diagnosis not present

## 2019-04-18 DIAGNOSIS — N186 End stage renal disease: Secondary | ICD-10-CM | POA: Diagnosis not present

## 2019-04-18 DIAGNOSIS — Z79899 Other long term (current) drug therapy: Secondary | ICD-10-CM | POA: Diagnosis not present

## 2019-04-18 DIAGNOSIS — E440 Moderate protein-calorie malnutrition: Secondary | ICD-10-CM | POA: Diagnosis not present

## 2019-04-18 DIAGNOSIS — D509 Iron deficiency anemia, unspecified: Secondary | ICD-10-CM | POA: Diagnosis not present

## 2019-04-18 DIAGNOSIS — R17 Unspecified jaundice: Secondary | ICD-10-CM | POA: Diagnosis not present

## 2019-04-19 DIAGNOSIS — N2581 Secondary hyperparathyroidism of renal origin: Secondary | ICD-10-CM | POA: Diagnosis not present

## 2019-04-19 DIAGNOSIS — Z79899 Other long term (current) drug therapy: Secondary | ICD-10-CM | POA: Diagnosis not present

## 2019-04-19 DIAGNOSIS — R17 Unspecified jaundice: Secondary | ICD-10-CM | POA: Diagnosis not present

## 2019-04-19 DIAGNOSIS — N186 End stage renal disease: Secondary | ICD-10-CM | POA: Diagnosis not present

## 2019-04-19 DIAGNOSIS — E876 Hypokalemia: Secondary | ICD-10-CM | POA: Diagnosis not present

## 2019-04-19 DIAGNOSIS — R82998 Other abnormal findings in urine: Secondary | ICD-10-CM | POA: Diagnosis not present

## 2019-04-19 DIAGNOSIS — Z23 Encounter for immunization: Secondary | ICD-10-CM | POA: Diagnosis not present

## 2019-04-19 DIAGNOSIS — E440 Moderate protein-calorie malnutrition: Secondary | ICD-10-CM | POA: Diagnosis not present

## 2019-04-19 DIAGNOSIS — D631 Anemia in chronic kidney disease: Secondary | ICD-10-CM | POA: Diagnosis not present

## 2019-04-19 DIAGNOSIS — D509 Iron deficiency anemia, unspecified: Secondary | ICD-10-CM | POA: Diagnosis not present

## 2019-04-20 DIAGNOSIS — Z992 Dependence on renal dialysis: Secondary | ICD-10-CM | POA: Diagnosis not present

## 2019-04-20 DIAGNOSIS — R82998 Other abnormal findings in urine: Secondary | ICD-10-CM | POA: Diagnosis not present

## 2019-04-20 DIAGNOSIS — R17 Unspecified jaundice: Secondary | ICD-10-CM | POA: Diagnosis not present

## 2019-04-20 DIAGNOSIS — E440 Moderate protein-calorie malnutrition: Secondary | ICD-10-CM | POA: Diagnosis not present

## 2019-04-20 DIAGNOSIS — D509 Iron deficiency anemia, unspecified: Secondary | ICD-10-CM | POA: Diagnosis not present

## 2019-04-20 DIAGNOSIS — E876 Hypokalemia: Secondary | ICD-10-CM | POA: Diagnosis not present

## 2019-04-20 DIAGNOSIS — Z79899 Other long term (current) drug therapy: Secondary | ICD-10-CM | POA: Diagnosis not present

## 2019-04-20 DIAGNOSIS — E1122 Type 2 diabetes mellitus with diabetic chronic kidney disease: Secondary | ICD-10-CM | POA: Diagnosis not present

## 2019-04-20 DIAGNOSIS — N186 End stage renal disease: Secondary | ICD-10-CM | POA: Diagnosis not present

## 2019-04-20 DIAGNOSIS — N2581 Secondary hyperparathyroidism of renal origin: Secondary | ICD-10-CM | POA: Diagnosis not present

## 2019-04-20 DIAGNOSIS — D631 Anemia in chronic kidney disease: Secondary | ICD-10-CM | POA: Diagnosis not present

## 2019-04-20 DIAGNOSIS — Z23 Encounter for immunization: Secondary | ICD-10-CM | POA: Diagnosis not present

## 2019-04-21 DIAGNOSIS — N2581 Secondary hyperparathyroidism of renal origin: Secondary | ICD-10-CM | POA: Diagnosis not present

## 2019-04-21 DIAGNOSIS — E440 Moderate protein-calorie malnutrition: Secondary | ICD-10-CM | POA: Diagnosis not present

## 2019-04-21 DIAGNOSIS — R82998 Other abnormal findings in urine: Secondary | ICD-10-CM | POA: Diagnosis not present

## 2019-04-21 DIAGNOSIS — Z79899 Other long term (current) drug therapy: Secondary | ICD-10-CM | POA: Diagnosis not present

## 2019-04-21 DIAGNOSIS — D509 Iron deficiency anemia, unspecified: Secondary | ICD-10-CM | POA: Diagnosis not present

## 2019-04-21 DIAGNOSIS — R17 Unspecified jaundice: Secondary | ICD-10-CM | POA: Diagnosis not present

## 2019-04-21 DIAGNOSIS — N186 End stage renal disease: Secondary | ICD-10-CM | POA: Diagnosis not present

## 2019-04-21 DIAGNOSIS — Z23 Encounter for immunization: Secondary | ICD-10-CM | POA: Diagnosis not present

## 2019-04-21 DIAGNOSIS — D631 Anemia in chronic kidney disease: Secondary | ICD-10-CM | POA: Diagnosis not present

## 2019-04-22 ENCOUNTER — Other Ambulatory Visit (HOSPITAL_COMMUNITY)
Admission: RE | Admit: 2019-04-22 | Discharge: 2019-04-22 | Disposition: A | Discharge status: Home / Self Care | Payer: Medicare Other | Source: Ambulatory Visit | Attending: Gastroenterology | Admitting: Gastroenterology

## 2019-04-22 ENCOUNTER — Other Ambulatory Visit: Payer: Self-pay

## 2019-04-22 ENCOUNTER — Encounter (HOSPITAL_COMMUNITY): Payer: Self-pay | Admitting: *Deleted

## 2019-04-22 DIAGNOSIS — Z1159 Encounter for screening for other viral diseases: Secondary | ICD-10-CM | POA: Diagnosis not present

## 2019-04-22 DIAGNOSIS — N186 End stage renal disease: Secondary | ICD-10-CM | POA: Diagnosis not present

## 2019-04-22 DIAGNOSIS — R17 Unspecified jaundice: Secondary | ICD-10-CM | POA: Diagnosis not present

## 2019-04-22 DIAGNOSIS — D509 Iron deficiency anemia, unspecified: Secondary | ICD-10-CM | POA: Diagnosis not present

## 2019-04-22 DIAGNOSIS — Z79899 Other long term (current) drug therapy: Secondary | ICD-10-CM | POA: Diagnosis not present

## 2019-04-22 DIAGNOSIS — Z23 Encounter for immunization: Secondary | ICD-10-CM | POA: Diagnosis not present

## 2019-04-22 DIAGNOSIS — E440 Moderate protein-calorie malnutrition: Secondary | ICD-10-CM | POA: Diagnosis not present

## 2019-04-22 DIAGNOSIS — D631 Anemia in chronic kidney disease: Secondary | ICD-10-CM | POA: Diagnosis not present

## 2019-04-22 DIAGNOSIS — R82998 Other abnormal findings in urine: Secondary | ICD-10-CM | POA: Diagnosis not present

## 2019-04-22 DIAGNOSIS — N2581 Secondary hyperparathyroidism of renal origin: Secondary | ICD-10-CM | POA: Diagnosis not present

## 2019-04-23 DIAGNOSIS — N186 End stage renal disease: Secondary | ICD-10-CM | POA: Diagnosis not present

## 2019-04-23 DIAGNOSIS — R82998 Other abnormal findings in urine: Secondary | ICD-10-CM | POA: Diagnosis not present

## 2019-04-23 DIAGNOSIS — Z79899 Other long term (current) drug therapy: Secondary | ICD-10-CM | POA: Diagnosis not present

## 2019-04-23 DIAGNOSIS — E440 Moderate protein-calorie malnutrition: Secondary | ICD-10-CM | POA: Diagnosis not present

## 2019-04-23 DIAGNOSIS — R17 Unspecified jaundice: Secondary | ICD-10-CM | POA: Diagnosis not present

## 2019-04-23 DIAGNOSIS — Z23 Encounter for immunization: Secondary | ICD-10-CM | POA: Diagnosis not present

## 2019-04-23 DIAGNOSIS — D509 Iron deficiency anemia, unspecified: Secondary | ICD-10-CM | POA: Diagnosis not present

## 2019-04-23 DIAGNOSIS — N2581 Secondary hyperparathyroidism of renal origin: Secondary | ICD-10-CM | POA: Diagnosis not present

## 2019-04-23 DIAGNOSIS — D631 Anemia in chronic kidney disease: Secondary | ICD-10-CM | POA: Diagnosis not present

## 2019-04-23 LAB — NOVEL CORONAVIRUS, NAA (HOSP ORDER, SEND-OUT TO REF LAB; TAT 18-24 HRS): SARS-CoV-2, NAA: NOT DETECTED

## 2019-04-24 DIAGNOSIS — Z23 Encounter for immunization: Secondary | ICD-10-CM | POA: Diagnosis not present

## 2019-04-24 DIAGNOSIS — D631 Anemia in chronic kidney disease: Secondary | ICD-10-CM | POA: Diagnosis not present

## 2019-04-24 DIAGNOSIS — E440 Moderate protein-calorie malnutrition: Secondary | ICD-10-CM | POA: Diagnosis not present

## 2019-04-24 DIAGNOSIS — N186 End stage renal disease: Secondary | ICD-10-CM | POA: Diagnosis not present

## 2019-04-24 DIAGNOSIS — N2581 Secondary hyperparathyroidism of renal origin: Secondary | ICD-10-CM | POA: Diagnosis not present

## 2019-04-24 DIAGNOSIS — Z79899 Other long term (current) drug therapy: Secondary | ICD-10-CM | POA: Diagnosis not present

## 2019-04-24 DIAGNOSIS — R82998 Other abnormal findings in urine: Secondary | ICD-10-CM | POA: Diagnosis not present

## 2019-04-24 DIAGNOSIS — D509 Iron deficiency anemia, unspecified: Secondary | ICD-10-CM | POA: Diagnosis not present

## 2019-04-24 DIAGNOSIS — R17 Unspecified jaundice: Secondary | ICD-10-CM | POA: Diagnosis not present

## 2019-04-25 ENCOUNTER — Ambulatory Visit (HOSPITAL_COMMUNITY): Payer: Medicare Other | Admitting: Certified Registered Nurse Anesthetist

## 2019-04-25 ENCOUNTER — Other Ambulatory Visit: Payer: Self-pay

## 2019-04-25 ENCOUNTER — Encounter (HOSPITAL_COMMUNITY): Payer: Self-pay | Admitting: *Deleted

## 2019-04-25 ENCOUNTER — Encounter (HOSPITAL_COMMUNITY)
Admission: RE | Disposition: A | Discharge status: Home / Self Care | Payer: Self-pay | Source: Home / Self Care | Attending: Gastroenterology

## 2019-04-25 ENCOUNTER — Ambulatory Visit (HOSPITAL_COMMUNITY)
Admission: RE | Admit: 2019-04-25 | Discharge: 2019-04-25 | Disposition: A | Discharge status: Home / Self Care | Payer: Medicare Other | Attending: Gastroenterology | Admitting: Gastroenterology

## 2019-04-25 DIAGNOSIS — E440 Moderate protein-calorie malnutrition: Secondary | ICD-10-CM | POA: Diagnosis not present

## 2019-04-25 DIAGNOSIS — C7A092 Malignant carcinoid tumor of the stomach: Secondary | ICD-10-CM | POA: Insufficient documentation

## 2019-04-25 DIAGNOSIS — D631 Anemia in chronic kidney disease: Secondary | ICD-10-CM | POA: Diagnosis not present

## 2019-04-25 DIAGNOSIS — K269 Duodenal ulcer, unspecified as acute or chronic, without hemorrhage or perforation: Secondary | ICD-10-CM | POA: Diagnosis not present

## 2019-04-25 DIAGNOSIS — Z96641 Presence of right artificial hip joint: Secondary | ICD-10-CM | POA: Insufficient documentation

## 2019-04-25 DIAGNOSIS — K3189 Other diseases of stomach and duodenum: Secondary | ICD-10-CM | POA: Diagnosis not present

## 2019-04-25 DIAGNOSIS — Z79899 Other long term (current) drug therapy: Secondary | ICD-10-CM | POA: Diagnosis not present

## 2019-04-25 DIAGNOSIS — R17 Unspecified jaundice: Secondary | ICD-10-CM | POA: Diagnosis not present

## 2019-04-25 DIAGNOSIS — N186 End stage renal disease: Secondary | ICD-10-CM | POA: Diagnosis not present

## 2019-04-25 DIAGNOSIS — M109 Gout, unspecified: Secondary | ICD-10-CM | POA: Insufficient documentation

## 2019-04-25 DIAGNOSIS — Z23 Encounter for immunization: Secondary | ICD-10-CM | POA: Diagnosis not present

## 2019-04-25 DIAGNOSIS — K228 Other specified diseases of esophagus: Secondary | ICD-10-CM | POA: Insufficient documentation

## 2019-04-25 DIAGNOSIS — K2980 Duodenitis without bleeding: Secondary | ICD-10-CM | POA: Insufficient documentation

## 2019-04-25 DIAGNOSIS — D125 Benign neoplasm of sigmoid colon: Secondary | ICD-10-CM | POA: Insufficient documentation

## 2019-04-25 DIAGNOSIS — D3A092 Benign carcinoid tumor of the stomach: Secondary | ICD-10-CM | POA: Diagnosis not present

## 2019-04-25 DIAGNOSIS — K449 Diaphragmatic hernia without obstruction or gangrene: Secondary | ICD-10-CM | POA: Insufficient documentation

## 2019-04-25 DIAGNOSIS — Z96651 Presence of right artificial knee joint: Secondary | ICD-10-CM | POA: Diagnosis not present

## 2019-04-25 DIAGNOSIS — K5730 Diverticulosis of large intestine without perforation or abscess without bleeding: Secondary | ICD-10-CM | POA: Insufficient documentation

## 2019-04-25 DIAGNOSIS — D131 Benign neoplasm of stomach: Secondary | ICD-10-CM | POA: Diagnosis not present

## 2019-04-25 DIAGNOSIS — Z992 Dependence on renal dialysis: Secondary | ICD-10-CM | POA: Diagnosis not present

## 2019-04-25 DIAGNOSIS — K222 Esophageal obstruction: Secondary | ICD-10-CM | POA: Insufficient documentation

## 2019-04-25 DIAGNOSIS — K2950 Unspecified chronic gastritis without bleeding: Secondary | ICD-10-CM | POA: Diagnosis not present

## 2019-04-25 DIAGNOSIS — K648 Other hemorrhoids: Secondary | ICD-10-CM | POA: Diagnosis not present

## 2019-04-25 DIAGNOSIS — K2970 Gastritis, unspecified, without bleeding: Secondary | ICD-10-CM | POA: Diagnosis not present

## 2019-04-25 DIAGNOSIS — Z7982 Long term (current) use of aspirin: Secondary | ICD-10-CM | POA: Insufficient documentation

## 2019-04-25 DIAGNOSIS — N2581 Secondary hyperparathyroidism of renal origin: Secondary | ICD-10-CM | POA: Diagnosis not present

## 2019-04-25 DIAGNOSIS — K2210 Ulcer of esophagus without bleeding: Secondary | ICD-10-CM | POA: Insufficient documentation

## 2019-04-25 DIAGNOSIS — E1122 Type 2 diabetes mellitus with diabetic chronic kidney disease: Secondary | ICD-10-CM | POA: Diagnosis not present

## 2019-04-25 DIAGNOSIS — Q394 Esophageal web: Secondary | ICD-10-CM | POA: Insufficient documentation

## 2019-04-25 DIAGNOSIS — R82998 Other abnormal findings in urine: Secondary | ICD-10-CM | POA: Diagnosis not present

## 2019-04-25 DIAGNOSIS — I120 Hypertensive chronic kidney disease with stage 5 chronic kidney disease or end stage renal disease: Secondary | ICD-10-CM | POA: Insufficient documentation

## 2019-04-25 DIAGNOSIS — D509 Iron deficiency anemia, unspecified: Secondary | ICD-10-CM | POA: Insufficient documentation

## 2019-04-25 DIAGNOSIS — K317 Polyp of stomach and duodenum: Secondary | ICD-10-CM | POA: Diagnosis not present

## 2019-04-25 HISTORY — PX: ESOPHAGOGASTRODUODENOSCOPY (EGD) WITH PROPOFOL: SHX5813

## 2019-04-25 HISTORY — PX: BIOPSY: SHX5522

## 2019-04-25 HISTORY — PX: COLONOSCOPY WITH PROPOFOL: SHX5780

## 2019-04-25 HISTORY — PX: SCLEROTHERAPY: SHX6841

## 2019-04-25 HISTORY — PX: POLYPECTOMY: SHX5525

## 2019-04-25 LAB — POCT I-STAT 4, (NA,K, GLUC, HGB,HCT)
Glucose, Bld: 143 mg/dL — ABNORMAL HIGH (ref 70–99)
HCT: 35 % — ABNORMAL LOW (ref 36.0–46.0)
Hemoglobin: 11.9 g/dL — ABNORMAL LOW (ref 12.0–15.0)
Potassium: 3.1 mmol/L — ABNORMAL LOW (ref 3.5–5.1)
Sodium: 136 mmol/L (ref 135–145)

## 2019-04-25 SURGERY — ESOPHAGOGASTRODUODENOSCOPY (EGD) WITH PROPOFOL
Anesthesia: Monitor Anesthesia Care

## 2019-04-25 MED ORDER — SPOT INK MARKER SYRINGE KIT
PACK | SUBMUCOSAL | Status: AC
  Filled 2019-04-25: qty 5

## 2019-04-25 MED ORDER — LIDOCAINE 2% (20 MG/ML) 5 ML SYRINGE
INTRAMUSCULAR | Status: DC | PRN
  Administered 2019-04-25: 100 mg via INTRAVENOUS

## 2019-04-25 MED ORDER — PHENYLEPHRINE 40 MCG/ML (10ML) SYRINGE FOR IV PUSH (FOR BLOOD PRESSURE SUPPORT)
PREFILLED_SYRINGE | INTRAVENOUS | Status: DC | PRN
  Administered 2019-04-25: 80 ug via INTRAVENOUS

## 2019-04-25 MED ORDER — SPOT INK MARKER SYRINGE KIT
PACK | SUBMUCOSAL | Status: DC | PRN
  Administered 2019-04-25: 2 mL via SUBMUCOSAL

## 2019-04-25 MED ORDER — PROPOFOL 10 MG/ML IV BOLUS
INTRAVENOUS | Status: AC
  Filled 2019-04-25: qty 20

## 2019-04-25 MED ORDER — PROPOFOL 500 MG/50ML IV EMUL
INTRAVENOUS | Status: DC | PRN
  Administered 2019-04-25: 75 ug/kg/min via INTRAVENOUS

## 2019-04-25 MED ORDER — PANTOPRAZOLE SODIUM 40 MG PO TBEC: 40.0000 mg | DELAYED_RELEASE_TABLET | Freq: Two times a day (BID) | ORAL | 1 refills | Status: DC

## 2019-04-25 MED ORDER — SODIUM CHLORIDE 0.9 % IV SOLN
INTRAVENOUS | Status: DC
  Administered 2019-04-25: 12:00:00 via INTRAVENOUS

## 2019-04-25 MED ORDER — SODIUM CHLORIDE 0.9 % IV SOLN: INTRAVENOUS | Status: DC

## 2019-04-25 MED ORDER — PROPOFOL 10 MG/ML IV BOLUS
INTRAVENOUS | Status: DC | PRN
  Administered 2019-04-25 (×7): 20 mg via INTRAVENOUS

## 2019-04-25 SURGICAL SUPPLY — 25 items

## 2019-04-25 NOTE — Op Note (Signed)
[facility] Patient Name: Eileen Perez Procedure Date: 04/25/2019 MRN: [id number] Attending MD: Otis Brace , MD Date of Birth: 1941/03/15 CSN: [id number] Age: 78 Admit Type: Inpatient Procedure:                Upper GI endoscopy Indications:              Unexplained iron deficiency anemia, Suspected upper                            gastrointestinal bleeding Providers:                Otis Brace, MD, Glori Bickers, RN, Ladona Ridgel, Technician Referring MD:              Medicines:                Sedation Administered by an Anesthesia Professional Complications:             Estimated Blood Loss:     Estimated blood loss was minimal. Procedure:                Pre-Anesthesia Assessment:                           - Prior to the procedure, a History and Physical                            was performed, and patient medications and                            allergies were reviewed. The patient's tolerance of                            previous anesthesia was also reviewed. The risks                            and benefits of the procedure and the sedation                            options and risks were discussed with the patient.                            All questions were answered, and informed consent                            was obtained. Prior Anticoagulants: The patient has                            taken no previous anticoagulant or antiplatelet                            agents. ASA Grade Assessment: III - A patient with  severe systemic disease. After reviewing the risks                            and benefits, the patient was deemed in                            satisfactory condition to undergo the procedure.                           After obtaining informed consent, the endoscope was                            passed under direct vision. Throughout the                            procedure,  the patient's blood pressure, pulse, and                            oxygen saturations were monitored continuously. The                            GIF-H190 ([id number]) Olympus gastroscope was                            introduced through the mouth, and advanced to the                            second part of duodenum. The upper GI endoscopy was                            accomplished without difficulty. The patient                            tolerated the procedure well. Scope In: Scope Out: Findings:      Few superficial esophageal ulcers with no bleeding were found in the       distal esophagus.      A non-obstructing Schatzki ring was found at the gastroesophageal       junction at 25 cm.      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      A small hiatal hernia was present.      A single small nodule with a localized distribution was found at Upper       esophageal sphincter.      A single 15 mm sessile polyp was found in the gastric body. The polyp       was removed with a piecemeal technique using a hot snare. Resection and       retrieval were complete. Area was tattooed with an injection of Spot       (carbon black). polyp removed with use of net.      Diffuse mild inflammation characterized by erythema was found in the       entire examined stomach. Biopsies were taken with a cold forceps for       histology.      A single small sessile polyp was found in the  cardia. Polypectomy was       not attempted.      A few small submucosal nodules were found in the second portion of the       duodenum. Biopsies were taken with a cold forceps for histology. Impression:               - Non-bleeding esophageal ulcers.                           - Non-obstructing Schatzki ring.                           - Web in the upper third of the esophagus.                           - Small hiatal hernia.                           - Nodule found in the esophagus.                           -  A single gastric polyp. Resected and retrieved.                            Tattooed.                           - Gastritis. Biopsied.                           - A single gastric polyp. Resection not attempted.                           - Submucosal nodule found in the duodenum. Biopsied. Moderate Sedation:      Moderate (conscious) sedation was personally administered by an       anesthesia professional. The following parameters were monitored: oxygen       saturation, heart rate, blood pressure, and response to care. Recommendation:           - Use Protonix (pantoprazole) 40 mg PO BID.                           - Await pathology results.                           - Repeat upper endoscopy in 2 months to check                            healing.                           - Perform a colonoscopy today. Procedure Code(s):        --- Professional ---                           [phone], Esophagogastroduodenoscopy, flexible,  transoral; with removal of tumor(s), polyp(s), or                            other lesion(s) by snare technique                           43236, 59, Esophagogastroduodenoscopy, flexible,                            transoral; with directed submucosal injection(s),                            any substance                           43239, 76, Esophagogastroduodenoscopy, flexible,                            transoral; with biopsy, single or multiple Diagnosis Code(s):        --- Professional ---                           K22.10, Ulcer of esophagus without bleeding                           K22.2, Esophageal obstruction                           Q39.4, Esophageal web                           K22.8, Other specified diseases of esophagus                           K31.7, Polyp of stomach and duodenum                           K29.70, Gastritis, unspecified, without bleeding                           K31.89, Other diseases of stomach and duodenum                            D50.9, Iron deficiency anemia, unspecified CPT copyright 2019 American Medical Association. All rights reserved. The codes documented in this report are preliminary and upon coder review may  be revised to meet current compliance requirements. Otis Brace, MD Otis Brace, MD 04/25/2019 1:15:08 PM Number of Addenda: 0

## 2019-04-25 NOTE — Discharge Instructions (Signed)

## 2019-04-25 NOTE — Op Note (Signed)
[facility] Patient Name: Eileen Perez Procedure Date: 04/25/2019 MRN: [id number] Attending MD: Otis Brace , MD Date of Birth: [birth year] CSN: [id number] Age: 78 Admit Type: Inpatient Procedure:                Colonoscopy Indications:              Last colonoscopy: 2013, Unexplained iron deficiency                            anemia Providers:                Otis Brace, MD, Glori Bickers, RN, Ladona Ridgel, Technician Referring MD:              Medicines:                Sedation Administered by an Anesthesia Professional Complications:            No immediate complications. Estimated Blood Loss:     Estimated blood loss was minimal. Estimated blood                            loss was minimal. Procedure:                Pre-Anesthesia Assessment:                           - Prior to the procedure, a History and Physical                            was performed, and patient medications and                            allergies were reviewed. The patient's tolerance of                            previous anesthesia was also reviewed. The risks                            and benefits of the procedure and the sedation                            options and risks were discussed with the patient.                            All questions were answered, and informed consent                            was obtained. Prior Anticoagulants: The patient has                            taken no previous anticoagulant or antiplatelet                            agents. ASA Grade Assessment: III -  A patient with                            severe systemic disease. After reviewing the risks                            and benefits, the patient was deemed in                            satisfactory condition to undergo the procedure.                           After obtaining informed consent, the colonoscope                            was passed under  direct vision. Throughout the                            procedure, the patient's blood pressure, pulse, and                            oxygen saturations were monitored continuously. The                            PCF-H190DL ([id number]) Olympus pediatric colonscope                            was introduced through the anus and advanced to the                            the terminal ileum, with identification of the                            appendiceal orifice and IC valve. The colonoscopy                            was technically difficult and complex due to                            restricted mobility of the colon. Successful                            completion of the procedure was aided by applying                            abdominal pressure. The patient tolerated the                            procedure well. The quality of the bowel                            preparation was good. Scope In: 12:38:52 PM Scope Out: 1:02:45 PM Scope Withdrawal Time: 0 hours 12 minutes 24 seconds  Total Procedure Duration: 0 hours 23 minutes 53 seconds  Findings:      The perianal and digital rectal examinations were normal.      The terminal ileum appeared normal.      Two sessile polyps were found in the sigmoid colon. The polyps were 4 to       5 mm in size. These polyps were removed with a hot snare. Resection and       retrieval were complete.      Multiple small and large-mouthed diverticula were found in the sigmoid       colon. There was narrowing of the colon in association with the       diverticular opening.      Internal hemorrhoids were found during retroflexion. The hemorrhoids       were small.      There is no endoscopic evidence of bleeding in the entire colon. Impression:               - The examined portion of the ileum was normal.                           - Two 4 to 5 mm polyps in the sigmoid colon,                            removed with a hot snare. Resected and  retrieved.                           - Mild diverticulosis in the sigmoid colon. There                            was narrowing of the colon in association with the                            diverticular opening.                           - Internal hemorrhoids. Moderate Sedation:      Moderate (conscious) sedation was personally administered by an       anesthesia professional. The following parameters were monitored: oxygen       saturation, heart rate, blood pressure, and response to care. Recommendation:           - Return patient to hospital ward for ongoing care.                           - Resume previous diet.                           - Continue present medications.                           - No aspirin, ibuprofen, naproxen, or other                            non-steroidal anti-inflammatory drugs for 5 days                            after polyp removal.                           -  Await pathology results.                           - Repeat colonoscopy date to be determined after                            pending pathology results are reviewed for                            surveillance based on pathology results.                           - Return to my office as previously scheduled. Procedure Code(s):        --- Professional ---                           [phone], Colonoscopy, flexible; with removal of                            tumor(s), polyp(s), or other lesion(s) by snare                            technique Diagnosis Code(s):        --- Professional ---                           K64.8, Other hemorrhoids                           K63.5, Polyp of colon                           D50.9, Iron deficiency anemia, unspecified                           K57.30, Diverticulosis of large intestine without                            perforation or abscess without bleeding CPT copyright 2019 American Medical Association. All rights reserved. The codes documented in this report are  preliminary and upon coder review may  be revised to meet current compliance requirements. Otis Brace, MD Otis Brace, MD 04/25/2019 1:20:45 PM Number of Addenda: 0

## 2019-04-25 NOTE — Anesthesia Preprocedure Evaluation (Addendum)
Anesthesia Evaluation  Patient identified by MRN, date of birth, ID band Patient awake    Reviewed: Allergy & Precautions, NPO status , Patient's Chart, lab work & pertinent test results  History of Anesthesia Complications Negative for: history of anesthetic complications  Airway Mallampati: II  TM Distance: >3 FB Neck ROM: Full    Dental  (+) Dental Advisory Given, Teeth Intact   Pulmonary neg pulmonary ROS,    breath sounds clear to auscultation       Cardiovascular hypertension, Pt. on medications and Pt. on home beta blockers  Rhythm:Regular Rate:Normal     Neuro/Psych negative neurological ROS  negative psych ROS   GI/Hepatic Neg liver ROS, hiatal hernia, GERD  Controlled,  Endo/Other  diabetes Pre-DM   Renal/GU ESRF and DialysisRenal disease     Musculoskeletal  (+) Arthritis ,  Gout    Abdominal   Peds  Hematology  (+) anemia ,   Anesthesia Other Findings   Reproductive/Obstetrics                            Anesthesia Physical Anesthesia Plan  ASA: III  Anesthesia Plan: MAC   Post-op Pain Management:    Induction: Intravenous  PONV Risk Score and Plan: 2 and Propofol infusion and Treatment may vary due to age or medical condition  Airway Management Planned: Nasal Cannula and Natural Airway  Additional Equipment: None  Intra-op Plan:   Post-operative Plan:   Informed Consent: I have reviewed the patients History and Physical, chart, labs and discussed the procedure including the risks, benefits and alternatives for the proposed anesthesia with the patient or authorized representative who has indicated his/her understanding and acceptance.       Plan Discussed with: CRNA and Anesthesiologist  Anesthesia Plan Comments:        Anesthesia Quick Evaluation

## 2019-04-25 NOTE — H&P (Signed)
Primary Care Physician:  Unk Pinto, MD Primary Gastroenterologist:  Dr. Alessandra Bevels  Reason for Visit :    HPI: Eileen Perez is a 78 y.o. female patient with past medical history of end-stage renal disease on peritoneal dialysis was referred to GI for evaluation of significant drop in hemoglobin. Patient was doing fine until February 2020 when she had some peritoneal dialysis catheter-related issues and was prescribed ciprofloxacin for 10 days. After taking antibiotics initially had swallowing issues for with pills and solid foods but it has resolved now. Her hemoglobin was 13 on January 04, 2019. Follow-up blood work in April showed hemoglobin down to 8.3.        She denies seeing any blood in the stool or black stool. Denies diarrhea or constipation. Denies reflux, heartburn or pain while swallowing. Denies nausea or vomiting. Denies abdominal pain. Denies unintentional weight loss.        She had EGD and colonoscopy in 2013 for iron deficiency anemia. EGD showed 2 nonbleeding gastric AVMs. Small bowel biopsy was negative for celiac disease. Colonoscopy showed diverticulosis and internal hemorrhoids. Repeat colonoscopy was recommended in 5 years because of family history of GIST in brother.  Past Medical History:  Diagnosis Date  . Anemia   . Arthritis    Osteoarthritis  . Chronic kidney disease    Chronic renal insufficiency, peritoneal dialysis qday sees dr Moshe Cipro  . Diabetes mellitus    Pre  . Diverticulosis   . GERD (gastroesophageal reflux disease)   . Gout   . Hiatal hernia   . History of blood transfusion   . Hyperlipidemia   . Hypertension   . Osteopenia   . Pancreatic cyst 1999  . Thyroid disease    Hyperparathyroid     Past Surgical History:  Procedure Laterality Date  . CHOLECYSTECTOMY    . COLONOSCOPY  03/21/12   Next one in 2018  . EYE SURGERY Left    cataract extraction with IOL  . INSERTION OF DIALYSIS CATHETER N/A 02/15/2017   Procedure: REVISION  OF DIALYSIS CATHETER;  Surgeon: Algernon Huxley, MD;  Location: [facility] ORS;  Service: Cardiovascular;  Laterality: N/A;  . JOINT REPLACEMENT  2012   left knee  . left knee replacement     . PANCREATIC CYST EXCISION  1999  . TOTAL HIP ARTHROPLASTY Right 05/26/2015   Procedure: RIGHT TOTAL HIP ARTHROPLASTY ANTERIOR APPROACH;  Surgeon: Gaynelle Arabian, MD;  Location: [facility] ORS;  Service: Orthopedics;  Laterality: Right;  . TOTAL KNEE ARTHROPLASTY Right 06/09/2013   Procedure: RIGHT TOTAL KNEE ARTHROPLASTY;  Surgeon: Gearlean Alf, MD;  Location: [facility] ORS;  Service: Orthopedics;  Laterality: Right;    Prior to Admission medications   Medication Sig Start Date End Date Taking? Authorizing Provider  allopurinol (ZYLOPRIM) 300 MG tablet TAKE 1/2 TO 1 TABLET BY  MOUTH DAILY AS DIRECTED Patient taking differently: TAKE 1/2 TO 1 TABLET BY&nbsp;&nbsp;MOUTH DAILY AS DIRECTED 10/30/18  Yes Liane Comber, NP  aspirin 81 MG tablet Take 81 mg by mouth at bedtime.    Yes [provider]  b complex-vitamin c-folic acid (NEPHRO-VITE) 0.8 MG TABS tablet Take 1 tablet by mouth at bedtime.   Yes [provider]  calcitRIOL (ROCALTROL) 0.5 MCG capsule Take 0.5 mcg by mouth daily.    Yes [provider]  Cholecalciferol (VITAMIN D-3 PO) Take 5,000 Units by mouth daily.    Yes [provider]  cinacalcet (SENSIPAR) 30 MG tablet Take 30 mg by mouth daily.  Yes [provider]  Flaxseed, Linseed, (FLAXSEED OIL) 1000 MG CAPS Take 1 capsule by mouth daily.    Yes [provider]  glucose blood (ACCU-CHEK AVIVA PLUS) test strip Test once daily 10/26/15  Yes Unk Pinto, MD  hydrALAZINE (APRESOLINE) 10 MG tablet TAKE 1 TABLET BY MOUTH 2  TIMES DAILY. 01/15/19  Yes Lendon Colonel, NP  labetalol (NORMODYNE) 200 MG tablet TAKE 1 TABLET BY MOUTH TWO  TIMES DAILY Patient taking differently: Take 200 mg by mouth 2 (two) times daily.  01/15/19  Yes Unk Pinto, MD  Omega-3  Fatty Acids (FISH OIL) 1200 MG CAPS Take 1,200 mg by mouth daily.    Yes [provider]  sevelamer carbonate (RENVELA) 800 MG tablet Take 800 mg by mouth 3 (three) times daily with meals.    Yes [provider]  ZINC GLUCONATE PO Take 40 mg by mouth daily.    Yes [provider]    Scheduled Meds: Continuous Infusions: . sodium chloride     PRN Meds:.  Allergies as of 04/18/2019 - Review Complete 03/20/2019  Allergen Reaction Noted  . Ace inhibitors  09/14/2013  . Nsaids  09/15/2013    Family History  Problem Relation Age of Onset  . Cancer Father        Prostate and Throat  . Hypertension Mother   . Cancer Mother        Colon with METS  . Diabetes Mother     Social History   Socioeconomic History  . Marital status: Divorced    Spouse name: Not on file  . Number of children: 3  . Years of education: Not on file  . Highest education level: Not on file  Occupational History  . Occupation: Retired     Comment: Publishing copy  . Financial resource strain: Not on file  . Food insecurity:    Worry: Not on file    Inability: Not on file  . Transportation needs:    Medical: Not on file    Non-medical: Not on file  Tobacco Use  . Smoking status: Never Smoker  . Smokeless tobacco: Never Used  Substance and Sexual Activity  . Alcohol use: No    Alcohol/week: 0.0 standard drinks  . Drug use: No  . Sexual activity: Not on file  Lifestyle  . Physical activity:    Days per week: Not on file    Minutes per session: Not on file  . Stress: Not on file  Relationships  . Social connections:    Talks on phone: Not on file    Gets together: Not on file    Attends religious service: Not on file    Active member of club or organization: Not on file    Attends meetings of clubs or organizations: Not on file    Relationship status: Not on file  . Intimate partner violence:    Fear of current or ex partner: Not on file    Emotionally abused:  Not on file    Physically abused: Not on file    Forced sexual activity: Not on file  Other Topics Concern  . Not on file  Social History Narrative  . Not on file    Review of Systems: All negative except as stated above in HPI.  Physical Exam: Vital signs: Vitals:   04/25/19 1114  Pulse: 84  Resp: 14  Temp: 98.4 F (36.9 C)  SpO2: 99%     General:  Alert,  Well-developed, well-nourished, pleasant and cooperative in NAD Lungs:  Clear throughout to auscultation.   No wheezes, crackles, or rhonchi. No acute distress. Heart:  Regular rate and rhythm; no murmurs, clicks, rubs,  or gallops. Abdomen: Soft, nontender, nondistended, bowel sounds present. Rectal:  Deferred  GI:  Lab Results: No results for input(s): WBC, HGB, HCT, PLT in the last 72 hours. BMET No results for input(s): NA, K, CL, CO2, GLUCOSE, BUN, CREATININE, CALCIUM in the last 72 hours. LFT No results for input(s): PROT, ALBUMIN, AST, ALT, ALKPHOS, BILITOT, BILIDIR, IBILI in the last 72 hours. PT/INR No results for input(s): LABPROT, INR in the last 72 hours.   Studies/Results: No results found.  Impression/Plan: -Significant drop in hemoglobin.  No overt bleeding. - H/O of AVMs during EGD in 2013. -Anemia. -end-stage renal disease on peritoneal dialysis  Recommendations -------------------------- -Proceed with EGD and colonoscopy today.  Risks (bleeding, infection, bowel perforation that could require surgery, sedation-related changes in cardiopulmonary systems), benefits (identification and possible treatment of source of symptoms, exclusion of certain causes of symptoms), and alternatives (watchful waiting, radiographic imaging studies, empiric medical treatment)  were explained to patient in detail and patient wishes to proceed.    LOS: 0 days   Otis Brace  MD, FACP 04/25/2019, 11:23 AM  Contact #  [phone]

## 2019-04-25 NOTE — Transfer of Care (Signed)
Immediate Anesthesia Transfer of Care Note  Patient: Jerilynn Feldmeier  Procedure(s) Performed: ESOPHAGOGASTRODUODENOSCOPY (EGD) WITH PROPOFOL (N/A ) COLONOSCOPY WITH PROPOFOL (N/A ) BIOPSY POLYPECTOMY SCLEROTHERAPY  Patient Location: Endoscopy Unit  Anesthesia Type:MAC  Level of Consciousness: awake, alert , oriented and patient cooperative  Airway & Oxygen Therapy: Patient Spontanous Breathing and Patient connected to nasal cannula oxygen  Post-op Assessment: Report given to RN, Post -op Vital signs reviewed and stable and Patient moving all extremities  Post vital signs: Reviewed and stable  Last Vitals:  Vitals Value Taken Time  BP    Temp    Pulse    Resp    SpO2      Last Pain:  Vitals:   04/25/19 1114  TempSrc: Oral  PainSc: 0-No pain         Complications: No apparent anesthesia complications

## 2019-04-25 NOTE — Anesthesia Postprocedure Evaluation (Signed)
Anesthesia Post Note  Patient: Eileen Perez  Procedure(s) Performed: ESOPHAGOGASTRODUODENOSCOPY (EGD) WITH PROPOFOL (N/A ) COLONOSCOPY WITH PROPOFOL (N/A ) BIOPSY POLYPECTOMY SCLEROTHERAPY     Patient location during evaluation: Endoscopy Anesthesia Type: MAC Level of consciousness: awake Pain management: pain level controlled Vital Signs Assessment: post-procedure vital signs reviewed and stable Respiratory status: spontaneous breathing Cardiovascular status: stable Postop Assessment: no apparent nausea or vomiting Anesthetic complications: no    Last Vitals:  Vitals:   04/25/19 1330 04/25/19 1335  BP: (!) 169/65   Pulse: 76 77  Resp: 12 12  Temp:    SpO2: 100% 98%    Last Pain:  Vitals:   04/25/19 1335  TempSrc:   PainSc: 0-No pain   Pain Goal:                   Huston Foley

## 2019-04-26 DIAGNOSIS — N2581 Secondary hyperparathyroidism of renal origin: Secondary | ICD-10-CM | POA: Diagnosis not present

## 2019-04-26 DIAGNOSIS — R17 Unspecified jaundice: Secondary | ICD-10-CM | POA: Diagnosis not present

## 2019-04-26 DIAGNOSIS — Z23 Encounter for immunization: Secondary | ICD-10-CM | POA: Diagnosis not present

## 2019-04-26 DIAGNOSIS — N186 End stage renal disease: Secondary | ICD-10-CM | POA: Diagnosis not present

## 2019-04-26 DIAGNOSIS — R82998 Other abnormal findings in urine: Secondary | ICD-10-CM | POA: Diagnosis not present

## 2019-04-26 DIAGNOSIS — D631 Anemia in chronic kidney disease: Secondary | ICD-10-CM | POA: Diagnosis not present

## 2019-04-26 DIAGNOSIS — Z79899 Other long term (current) drug therapy: Secondary | ICD-10-CM | POA: Diagnosis not present

## 2019-04-26 DIAGNOSIS — D509 Iron deficiency anemia, unspecified: Secondary | ICD-10-CM | POA: Diagnosis not present

## 2019-04-26 DIAGNOSIS — E440 Moderate protein-calorie malnutrition: Secondary | ICD-10-CM | POA: Diagnosis not present

## 2019-04-27 DIAGNOSIS — E440 Moderate protein-calorie malnutrition: Secondary | ICD-10-CM | POA: Diagnosis not present

## 2019-04-27 DIAGNOSIS — Z23 Encounter for immunization: Secondary | ICD-10-CM | POA: Diagnosis not present

## 2019-04-27 DIAGNOSIS — N2581 Secondary hyperparathyroidism of renal origin: Secondary | ICD-10-CM | POA: Diagnosis not present

## 2019-04-27 DIAGNOSIS — R17 Unspecified jaundice: Secondary | ICD-10-CM | POA: Diagnosis not present

## 2019-04-27 DIAGNOSIS — D509 Iron deficiency anemia, unspecified: Secondary | ICD-10-CM | POA: Diagnosis not present

## 2019-04-27 DIAGNOSIS — Z79899 Other long term (current) drug therapy: Secondary | ICD-10-CM | POA: Diagnosis not present

## 2019-04-27 DIAGNOSIS — R82998 Other abnormal findings in urine: Secondary | ICD-10-CM | POA: Diagnosis not present

## 2019-04-27 DIAGNOSIS — N186 End stage renal disease: Secondary | ICD-10-CM | POA: Diagnosis not present

## 2019-04-27 DIAGNOSIS — D631 Anemia in chronic kidney disease: Secondary | ICD-10-CM | POA: Diagnosis not present

## 2019-04-28 DIAGNOSIS — N186 End stage renal disease: Secondary | ICD-10-CM | POA: Diagnosis not present

## 2019-04-28 DIAGNOSIS — D631 Anemia in chronic kidney disease: Secondary | ICD-10-CM | POA: Diagnosis not present

## 2019-04-28 DIAGNOSIS — R82998 Other abnormal findings in urine: Secondary | ICD-10-CM | POA: Diagnosis not present

## 2019-04-28 DIAGNOSIS — D509 Iron deficiency anemia, unspecified: Secondary | ICD-10-CM | POA: Diagnosis not present

## 2019-04-28 DIAGNOSIS — N2581 Secondary hyperparathyroidism of renal origin: Secondary | ICD-10-CM | POA: Diagnosis not present

## 2019-04-28 DIAGNOSIS — R17 Unspecified jaundice: Secondary | ICD-10-CM | POA: Diagnosis not present

## 2019-04-28 DIAGNOSIS — Z23 Encounter for immunization: Secondary | ICD-10-CM | POA: Diagnosis not present

## 2019-04-28 DIAGNOSIS — Z79899 Other long term (current) drug therapy: Secondary | ICD-10-CM | POA: Diagnosis not present

## 2019-04-28 DIAGNOSIS — E440 Moderate protein-calorie malnutrition: Secondary | ICD-10-CM | POA: Diagnosis not present

## 2019-04-29 DIAGNOSIS — R17 Unspecified jaundice: Secondary | ICD-10-CM | POA: Diagnosis not present

## 2019-04-29 DIAGNOSIS — D509 Iron deficiency anemia, unspecified: Secondary | ICD-10-CM | POA: Diagnosis not present

## 2019-04-29 DIAGNOSIS — E440 Moderate protein-calorie malnutrition: Secondary | ICD-10-CM | POA: Diagnosis not present

## 2019-04-29 DIAGNOSIS — Z79899 Other long term (current) drug therapy: Secondary | ICD-10-CM | POA: Diagnosis not present

## 2019-04-29 DIAGNOSIS — N186 End stage renal disease: Secondary | ICD-10-CM | POA: Diagnosis not present

## 2019-04-29 DIAGNOSIS — N2581 Secondary hyperparathyroidism of renal origin: Secondary | ICD-10-CM | POA: Diagnosis not present

## 2019-04-29 DIAGNOSIS — Z23 Encounter for immunization: Secondary | ICD-10-CM | POA: Diagnosis not present

## 2019-04-29 DIAGNOSIS — R82998 Other abnormal findings in urine: Secondary | ICD-10-CM | POA: Diagnosis not present

## 2019-04-29 DIAGNOSIS — D631 Anemia in chronic kidney disease: Secondary | ICD-10-CM | POA: Diagnosis not present

## 2019-04-30 DIAGNOSIS — N2581 Secondary hyperparathyroidism of renal origin: Secondary | ICD-10-CM | POA: Diagnosis not present

## 2019-04-30 DIAGNOSIS — R82998 Other abnormal findings in urine: Secondary | ICD-10-CM | POA: Diagnosis not present

## 2019-04-30 DIAGNOSIS — E440 Moderate protein-calorie malnutrition: Secondary | ICD-10-CM | POA: Diagnosis not present

## 2019-04-30 DIAGNOSIS — Z79899 Other long term (current) drug therapy: Secondary | ICD-10-CM | POA: Diagnosis not present

## 2019-04-30 DIAGNOSIS — Z23 Encounter for immunization: Secondary | ICD-10-CM | POA: Diagnosis not present

## 2019-04-30 DIAGNOSIS — R17 Unspecified jaundice: Secondary | ICD-10-CM | POA: Diagnosis not present

## 2019-04-30 DIAGNOSIS — D631 Anemia in chronic kidney disease: Secondary | ICD-10-CM | POA: Diagnosis not present

## 2019-04-30 DIAGNOSIS — D509 Iron deficiency anemia, unspecified: Secondary | ICD-10-CM | POA: Diagnosis not present

## 2019-04-30 DIAGNOSIS — N186 End stage renal disease: Secondary | ICD-10-CM | POA: Diagnosis not present

## 2019-05-01 ENCOUNTER — Ambulatory Visit: Payer: Medicare Other | Admitting: Neurology

## 2019-05-01 DIAGNOSIS — R82998 Other abnormal findings in urine: Secondary | ICD-10-CM | POA: Diagnosis not present

## 2019-05-01 DIAGNOSIS — D509 Iron deficiency anemia, unspecified: Secondary | ICD-10-CM | POA: Diagnosis not present

## 2019-05-01 DIAGNOSIS — N2581 Secondary hyperparathyroidism of renal origin: Secondary | ICD-10-CM | POA: Diagnosis not present

## 2019-05-01 DIAGNOSIS — Z23 Encounter for immunization: Secondary | ICD-10-CM | POA: Diagnosis not present

## 2019-05-01 DIAGNOSIS — Z79899 Other long term (current) drug therapy: Secondary | ICD-10-CM | POA: Diagnosis not present

## 2019-05-01 DIAGNOSIS — D631 Anemia in chronic kidney disease: Secondary | ICD-10-CM | POA: Diagnosis not present

## 2019-05-01 DIAGNOSIS — N186 End stage renal disease: Secondary | ICD-10-CM | POA: Diagnosis not present

## 2019-05-01 DIAGNOSIS — E440 Moderate protein-calorie malnutrition: Secondary | ICD-10-CM | POA: Diagnosis not present

## 2019-05-01 DIAGNOSIS — R17 Unspecified jaundice: Secondary | ICD-10-CM | POA: Diagnosis not present

## 2019-05-02 DIAGNOSIS — D509 Iron deficiency anemia, unspecified: Secondary | ICD-10-CM | POA: Diagnosis not present

## 2019-05-02 DIAGNOSIS — R82998 Other abnormal findings in urine: Secondary | ICD-10-CM | POA: Diagnosis not present

## 2019-05-02 DIAGNOSIS — Z23 Encounter for immunization: Secondary | ICD-10-CM | POA: Diagnosis not present

## 2019-05-02 DIAGNOSIS — N2581 Secondary hyperparathyroidism of renal origin: Secondary | ICD-10-CM | POA: Diagnosis not present

## 2019-05-02 DIAGNOSIS — Z79899 Other long term (current) drug therapy: Secondary | ICD-10-CM | POA: Diagnosis not present

## 2019-05-02 DIAGNOSIS — N186 End stage renal disease: Secondary | ICD-10-CM | POA: Diagnosis not present

## 2019-05-02 DIAGNOSIS — R17 Unspecified jaundice: Secondary | ICD-10-CM | POA: Diagnosis not present

## 2019-05-02 DIAGNOSIS — E440 Moderate protein-calorie malnutrition: Secondary | ICD-10-CM | POA: Diagnosis not present

## 2019-05-02 DIAGNOSIS — D631 Anemia in chronic kidney disease: Secondary | ICD-10-CM | POA: Diagnosis not present

## 2019-05-03 DIAGNOSIS — D509 Iron deficiency anemia, unspecified: Secondary | ICD-10-CM | POA: Diagnosis not present

## 2019-05-03 DIAGNOSIS — E440 Moderate protein-calorie malnutrition: Secondary | ICD-10-CM | POA: Diagnosis not present

## 2019-05-03 DIAGNOSIS — D631 Anemia in chronic kidney disease: Secondary | ICD-10-CM | POA: Diagnosis not present

## 2019-05-03 DIAGNOSIS — R82998 Other abnormal findings in urine: Secondary | ICD-10-CM | POA: Diagnosis not present

## 2019-05-03 DIAGNOSIS — R17 Unspecified jaundice: Secondary | ICD-10-CM | POA: Diagnosis not present

## 2019-05-03 DIAGNOSIS — N2581 Secondary hyperparathyroidism of renal origin: Secondary | ICD-10-CM | POA: Diagnosis not present

## 2019-05-03 DIAGNOSIS — N186 End stage renal disease: Secondary | ICD-10-CM | POA: Diagnosis not present

## 2019-05-03 DIAGNOSIS — Z23 Encounter for immunization: Secondary | ICD-10-CM | POA: Diagnosis not present

## 2019-05-03 DIAGNOSIS — Z79899 Other long term (current) drug therapy: Secondary | ICD-10-CM | POA: Diagnosis not present

## 2019-05-04 DIAGNOSIS — D631 Anemia in chronic kidney disease: Secondary | ICD-10-CM | POA: Diagnosis not present

## 2019-05-04 DIAGNOSIS — E440 Moderate protein-calorie malnutrition: Secondary | ICD-10-CM | POA: Diagnosis not present

## 2019-05-04 DIAGNOSIS — R17 Unspecified jaundice: Secondary | ICD-10-CM | POA: Diagnosis not present

## 2019-05-04 DIAGNOSIS — D509 Iron deficiency anemia, unspecified: Secondary | ICD-10-CM | POA: Diagnosis not present

## 2019-05-04 DIAGNOSIS — Z79899 Other long term (current) drug therapy: Secondary | ICD-10-CM | POA: Diagnosis not present

## 2019-05-04 DIAGNOSIS — N2581 Secondary hyperparathyroidism of renal origin: Secondary | ICD-10-CM | POA: Diagnosis not present

## 2019-05-04 DIAGNOSIS — N186 End stage renal disease: Secondary | ICD-10-CM | POA: Diagnosis not present

## 2019-05-04 DIAGNOSIS — R82998 Other abnormal findings in urine: Secondary | ICD-10-CM | POA: Diagnosis not present

## 2019-05-04 DIAGNOSIS — Z23 Encounter for immunization: Secondary | ICD-10-CM | POA: Diagnosis not present

## 2019-05-05 ENCOUNTER — Other Ambulatory Visit: Payer: Self-pay | Admitting: Gastroenterology

## 2019-05-05 DIAGNOSIS — D631 Anemia in chronic kidney disease: Secondary | ICD-10-CM | POA: Diagnosis not present

## 2019-05-05 DIAGNOSIS — C7A00 Malignant carcinoid tumor of unspecified site: Secondary | ICD-10-CM

## 2019-05-05 DIAGNOSIS — R17 Unspecified jaundice: Secondary | ICD-10-CM | POA: Diagnosis not present

## 2019-05-05 DIAGNOSIS — N2581 Secondary hyperparathyroidism of renal origin: Secondary | ICD-10-CM | POA: Diagnosis not present

## 2019-05-05 DIAGNOSIS — R82998 Other abnormal findings in urine: Secondary | ICD-10-CM | POA: Diagnosis not present

## 2019-05-05 DIAGNOSIS — N186 End stage renal disease: Secondary | ICD-10-CM | POA: Diagnosis not present

## 2019-05-05 DIAGNOSIS — E440 Moderate protein-calorie malnutrition: Secondary | ICD-10-CM | POA: Diagnosis not present

## 2019-05-05 DIAGNOSIS — Z79899 Other long term (current) drug therapy: Secondary | ICD-10-CM | POA: Diagnosis not present

## 2019-05-05 DIAGNOSIS — Z23 Encounter for immunization: Secondary | ICD-10-CM | POA: Diagnosis not present

## 2019-05-05 DIAGNOSIS — D509 Iron deficiency anemia, unspecified: Secondary | ICD-10-CM | POA: Diagnosis not present

## 2019-05-06 DIAGNOSIS — N186 End stage renal disease: Secondary | ICD-10-CM | POA: Diagnosis not present

## 2019-05-06 DIAGNOSIS — D3A00 Benign carcinoid tumor of unspecified site: Secondary | ICD-10-CM | POA: Diagnosis not present

## 2019-05-06 DIAGNOSIS — D509 Iron deficiency anemia, unspecified: Secondary | ICD-10-CM | POA: Diagnosis not present

## 2019-05-06 DIAGNOSIS — Z79899 Other long term (current) drug therapy: Secondary | ICD-10-CM | POA: Diagnosis not present

## 2019-05-06 DIAGNOSIS — R17 Unspecified jaundice: Secondary | ICD-10-CM | POA: Diagnosis not present

## 2019-05-06 DIAGNOSIS — R82998 Other abnormal findings in urine: Secondary | ICD-10-CM | POA: Diagnosis not present

## 2019-05-06 DIAGNOSIS — Z23 Encounter for immunization: Secondary | ICD-10-CM | POA: Diagnosis not present

## 2019-05-06 DIAGNOSIS — E440 Moderate protein-calorie malnutrition: Secondary | ICD-10-CM | POA: Diagnosis not present

## 2019-05-06 DIAGNOSIS — D631 Anemia in chronic kidney disease: Secondary | ICD-10-CM | POA: Diagnosis not present

## 2019-05-06 DIAGNOSIS — N2581 Secondary hyperparathyroidism of renal origin: Secondary | ICD-10-CM | POA: Diagnosis not present

## 2019-05-07 DIAGNOSIS — R82998 Other abnormal findings in urine: Secondary | ICD-10-CM | POA: Diagnosis not present

## 2019-05-07 DIAGNOSIS — N2581 Secondary hyperparathyroidism of renal origin: Secondary | ICD-10-CM | POA: Diagnosis not present

## 2019-05-07 DIAGNOSIS — D509 Iron deficiency anemia, unspecified: Secondary | ICD-10-CM | POA: Diagnosis not present

## 2019-05-07 DIAGNOSIS — R17 Unspecified jaundice: Secondary | ICD-10-CM | POA: Diagnosis not present

## 2019-05-07 DIAGNOSIS — N186 End stage renal disease: Secondary | ICD-10-CM | POA: Diagnosis not present

## 2019-05-07 DIAGNOSIS — Z23 Encounter for immunization: Secondary | ICD-10-CM | POA: Diagnosis not present

## 2019-05-07 DIAGNOSIS — D631 Anemia in chronic kidney disease: Secondary | ICD-10-CM | POA: Diagnosis not present

## 2019-05-07 DIAGNOSIS — Z79899 Other long term (current) drug therapy: Secondary | ICD-10-CM | POA: Diagnosis not present

## 2019-05-07 DIAGNOSIS — E440 Moderate protein-calorie malnutrition: Secondary | ICD-10-CM | POA: Diagnosis not present

## 2019-05-08 DIAGNOSIS — Z79899 Other long term (current) drug therapy: Secondary | ICD-10-CM | POA: Diagnosis not present

## 2019-05-08 DIAGNOSIS — R82998 Other abnormal findings in urine: Secondary | ICD-10-CM | POA: Diagnosis not present

## 2019-05-08 DIAGNOSIS — D509 Iron deficiency anemia, unspecified: Secondary | ICD-10-CM | POA: Diagnosis not present

## 2019-05-08 DIAGNOSIS — N2581 Secondary hyperparathyroidism of renal origin: Secondary | ICD-10-CM | POA: Diagnosis not present

## 2019-05-08 DIAGNOSIS — D631 Anemia in chronic kidney disease: Secondary | ICD-10-CM | POA: Diagnosis not present

## 2019-05-08 DIAGNOSIS — Z23 Encounter for immunization: Secondary | ICD-10-CM | POA: Diagnosis not present

## 2019-05-08 DIAGNOSIS — N186 End stage renal disease: Secondary | ICD-10-CM | POA: Diagnosis not present

## 2019-05-08 DIAGNOSIS — R17 Unspecified jaundice: Secondary | ICD-10-CM | POA: Diagnosis not present

## 2019-05-08 DIAGNOSIS — E440 Moderate protein-calorie malnutrition: Secondary | ICD-10-CM | POA: Diagnosis not present

## 2019-05-09 ENCOUNTER — Ambulatory Visit
Admission: RE | Admit: 2019-05-09 | Discharge: 2019-05-09 | Disposition: A | Discharge status: Home / Self Care | Payer: Medicare Other | Source: Ambulatory Visit | Attending: Internal Medicine | Admitting: Internal Medicine

## 2019-05-09 ENCOUNTER — Other Ambulatory Visit: Payer: Self-pay

## 2019-05-09 DIAGNOSIS — N2581 Secondary hyperparathyroidism of renal origin: Secondary | ICD-10-CM | POA: Diagnosis not present

## 2019-05-09 DIAGNOSIS — D631 Anemia in chronic kidney disease: Secondary | ICD-10-CM | POA: Diagnosis not present

## 2019-05-09 DIAGNOSIS — E440 Moderate protein-calorie malnutrition: Secondary | ICD-10-CM | POA: Diagnosis not present

## 2019-05-09 DIAGNOSIS — Z79899 Other long term (current) drug therapy: Secondary | ICD-10-CM | POA: Diagnosis not present

## 2019-05-09 DIAGNOSIS — R17 Unspecified jaundice: Secondary | ICD-10-CM | POA: Diagnosis not present

## 2019-05-09 DIAGNOSIS — Z1231 Encounter for screening mammogram for malignant neoplasm of breast: Secondary | ICD-10-CM

## 2019-05-09 DIAGNOSIS — Z23 Encounter for immunization: Secondary | ICD-10-CM | POA: Diagnosis not present

## 2019-05-09 DIAGNOSIS — D509 Iron deficiency anemia, unspecified: Secondary | ICD-10-CM | POA: Diagnosis not present

## 2019-05-09 DIAGNOSIS — R82998 Other abnormal findings in urine: Secondary | ICD-10-CM | POA: Diagnosis not present

## 2019-05-09 DIAGNOSIS — N186 End stage renal disease: Secondary | ICD-10-CM | POA: Diagnosis not present

## 2019-05-10 DIAGNOSIS — R82998 Other abnormal findings in urine: Secondary | ICD-10-CM | POA: Diagnosis not present

## 2019-05-10 DIAGNOSIS — D509 Iron deficiency anemia, unspecified: Secondary | ICD-10-CM | POA: Diagnosis not present

## 2019-05-10 DIAGNOSIS — Z23 Encounter for immunization: Secondary | ICD-10-CM | POA: Diagnosis not present

## 2019-05-10 DIAGNOSIS — E440 Moderate protein-calorie malnutrition: Secondary | ICD-10-CM | POA: Diagnosis not present

## 2019-05-10 DIAGNOSIS — R17 Unspecified jaundice: Secondary | ICD-10-CM | POA: Diagnosis not present

## 2019-05-10 DIAGNOSIS — D631 Anemia in chronic kidney disease: Secondary | ICD-10-CM | POA: Diagnosis not present

## 2019-05-10 DIAGNOSIS — N2581 Secondary hyperparathyroidism of renal origin: Secondary | ICD-10-CM | POA: Diagnosis not present

## 2019-05-10 DIAGNOSIS — Z79899 Other long term (current) drug therapy: Secondary | ICD-10-CM | POA: Diagnosis not present

## 2019-05-10 DIAGNOSIS — N186 End stage renal disease: Secondary | ICD-10-CM | POA: Diagnosis not present

## 2019-05-11 DIAGNOSIS — N2581 Secondary hyperparathyroidism of renal origin: Secondary | ICD-10-CM | POA: Diagnosis not present

## 2019-05-11 DIAGNOSIS — Z23 Encounter for immunization: Secondary | ICD-10-CM | POA: Diagnosis not present

## 2019-05-11 DIAGNOSIS — E440 Moderate protein-calorie malnutrition: Secondary | ICD-10-CM | POA: Diagnosis not present

## 2019-05-11 DIAGNOSIS — D631 Anemia in chronic kidney disease: Secondary | ICD-10-CM | POA: Diagnosis not present

## 2019-05-11 DIAGNOSIS — N186 End stage renal disease: Secondary | ICD-10-CM | POA: Diagnosis not present

## 2019-05-11 DIAGNOSIS — D509 Iron deficiency anemia, unspecified: Secondary | ICD-10-CM | POA: Diagnosis not present

## 2019-05-11 DIAGNOSIS — R82998 Other abnormal findings in urine: Secondary | ICD-10-CM | POA: Diagnosis not present

## 2019-05-11 DIAGNOSIS — Z79899 Other long term (current) drug therapy: Secondary | ICD-10-CM | POA: Diagnosis not present

## 2019-05-11 DIAGNOSIS — R17 Unspecified jaundice: Secondary | ICD-10-CM | POA: Diagnosis not present

## 2019-05-12 DIAGNOSIS — E440 Moderate protein-calorie malnutrition: Secondary | ICD-10-CM | POA: Diagnosis not present

## 2019-05-12 DIAGNOSIS — N2581 Secondary hyperparathyroidism of renal origin: Secondary | ICD-10-CM | POA: Diagnosis not present

## 2019-05-12 DIAGNOSIS — Z23 Encounter for immunization: Secondary | ICD-10-CM | POA: Diagnosis not present

## 2019-05-12 DIAGNOSIS — R17 Unspecified jaundice: Secondary | ICD-10-CM | POA: Diagnosis not present

## 2019-05-12 DIAGNOSIS — D509 Iron deficiency anemia, unspecified: Secondary | ICD-10-CM | POA: Diagnosis not present

## 2019-05-12 DIAGNOSIS — N186 End stage renal disease: Secondary | ICD-10-CM | POA: Diagnosis not present

## 2019-05-12 DIAGNOSIS — R82998 Other abnormal findings in urine: Secondary | ICD-10-CM | POA: Diagnosis not present

## 2019-05-12 DIAGNOSIS — Z79899 Other long term (current) drug therapy: Secondary | ICD-10-CM | POA: Diagnosis not present

## 2019-05-12 DIAGNOSIS — D631 Anemia in chronic kidney disease: Secondary | ICD-10-CM | POA: Diagnosis not present

## 2019-05-13 DIAGNOSIS — N2581 Secondary hyperparathyroidism of renal origin: Secondary | ICD-10-CM | POA: Diagnosis not present

## 2019-05-13 DIAGNOSIS — R82998 Other abnormal findings in urine: Secondary | ICD-10-CM | POA: Diagnosis not present

## 2019-05-13 DIAGNOSIS — Z23 Encounter for immunization: Secondary | ICD-10-CM | POA: Diagnosis not present

## 2019-05-13 DIAGNOSIS — E440 Moderate protein-calorie malnutrition: Secondary | ICD-10-CM | POA: Diagnosis not present

## 2019-05-13 DIAGNOSIS — Z79899 Other long term (current) drug therapy: Secondary | ICD-10-CM | POA: Diagnosis not present

## 2019-05-13 DIAGNOSIS — R17 Unspecified jaundice: Secondary | ICD-10-CM | POA: Diagnosis not present

## 2019-05-13 DIAGNOSIS — D631 Anemia in chronic kidney disease: Secondary | ICD-10-CM | POA: Diagnosis not present

## 2019-05-13 DIAGNOSIS — D509 Iron deficiency anemia, unspecified: Secondary | ICD-10-CM | POA: Diagnosis not present

## 2019-05-13 DIAGNOSIS — N186 End stage renal disease: Secondary | ICD-10-CM | POA: Diagnosis not present

## 2019-05-14 DIAGNOSIS — N2581 Secondary hyperparathyroidism of renal origin: Secondary | ICD-10-CM | POA: Diagnosis not present

## 2019-05-14 DIAGNOSIS — D631 Anemia in chronic kidney disease: Secondary | ICD-10-CM | POA: Diagnosis not present

## 2019-05-14 DIAGNOSIS — E440 Moderate protein-calorie malnutrition: Secondary | ICD-10-CM | POA: Diagnosis not present

## 2019-05-14 DIAGNOSIS — Z79899 Other long term (current) drug therapy: Secondary | ICD-10-CM | POA: Diagnosis not present

## 2019-05-14 DIAGNOSIS — R82998 Other abnormal findings in urine: Secondary | ICD-10-CM | POA: Diagnosis not present

## 2019-05-14 DIAGNOSIS — N186 End stage renal disease: Secondary | ICD-10-CM | POA: Diagnosis not present

## 2019-05-14 DIAGNOSIS — D509 Iron deficiency anemia, unspecified: Secondary | ICD-10-CM | POA: Diagnosis not present

## 2019-05-14 DIAGNOSIS — R17 Unspecified jaundice: Secondary | ICD-10-CM | POA: Diagnosis not present

## 2019-05-14 DIAGNOSIS — Z23 Encounter for immunization: Secondary | ICD-10-CM | POA: Diagnosis not present

## 2019-05-15 ENCOUNTER — Telehealth: Payer: Self-pay

## 2019-05-15 DIAGNOSIS — Z23 Encounter for immunization: Secondary | ICD-10-CM | POA: Diagnosis not present

## 2019-05-15 DIAGNOSIS — Z79899 Other long term (current) drug therapy: Secondary | ICD-10-CM | POA: Diagnosis not present

## 2019-05-15 DIAGNOSIS — D509 Iron deficiency anemia, unspecified: Secondary | ICD-10-CM | POA: Diagnosis not present

## 2019-05-15 DIAGNOSIS — N186 End stage renal disease: Secondary | ICD-10-CM | POA: Diagnosis not present

## 2019-05-15 DIAGNOSIS — N2581 Secondary hyperparathyroidism of renal origin: Secondary | ICD-10-CM | POA: Diagnosis not present

## 2019-05-15 DIAGNOSIS — E440 Moderate protein-calorie malnutrition: Secondary | ICD-10-CM | POA: Diagnosis not present

## 2019-05-15 DIAGNOSIS — D631 Anemia in chronic kidney disease: Secondary | ICD-10-CM | POA: Diagnosis not present

## 2019-05-15 DIAGNOSIS — R82998 Other abnormal findings in urine: Secondary | ICD-10-CM | POA: Diagnosis not present

## 2019-05-15 DIAGNOSIS — R17 Unspecified jaundice: Secondary | ICD-10-CM | POA: Diagnosis not present

## 2019-05-15 NOTE — Telephone Encounter (Signed)
Attempted to reach patient to complete CCM

## 2019-05-16 ENCOUNTER — Other Ambulatory Visit: Payer: Self-pay

## 2019-05-16 ENCOUNTER — Encounter (INDEPENDENT_AMBULATORY_CARE_PROVIDER_SITE_OTHER): Payer: Medicare Other | Admitting: Ophthalmology

## 2019-05-16 DIAGNOSIS — E11311 Type 2 diabetes mellitus with unspecified diabetic retinopathy with macular edema: Secondary | ICD-10-CM | POA: Diagnosis not present

## 2019-05-16 DIAGNOSIS — H35033 Hypertensive retinopathy, bilateral: Secondary | ICD-10-CM

## 2019-05-16 DIAGNOSIS — E113292 Type 2 diabetes mellitus with mild nonproliferative diabetic retinopathy without macular edema, left eye: Secondary | ICD-10-CM

## 2019-05-16 DIAGNOSIS — H43813 Vitreous degeneration, bilateral: Secondary | ICD-10-CM

## 2019-05-16 DIAGNOSIS — E440 Moderate protein-calorie malnutrition: Secondary | ICD-10-CM | POA: Diagnosis not present

## 2019-05-16 DIAGNOSIS — D631 Anemia in chronic kidney disease: Secondary | ICD-10-CM | POA: Diagnosis not present

## 2019-05-16 DIAGNOSIS — R82998 Other abnormal findings in urine: Secondary | ICD-10-CM | POA: Diagnosis not present

## 2019-05-16 DIAGNOSIS — I10 Essential (primary) hypertension: Secondary | ICD-10-CM | POA: Diagnosis not present

## 2019-05-16 DIAGNOSIS — Z23 Encounter for immunization: Secondary | ICD-10-CM | POA: Diagnosis not present

## 2019-05-16 DIAGNOSIS — R17 Unspecified jaundice: Secondary | ICD-10-CM | POA: Diagnosis not present

## 2019-05-16 DIAGNOSIS — N2581 Secondary hyperparathyroidism of renal origin: Secondary | ICD-10-CM | POA: Diagnosis not present

## 2019-05-16 DIAGNOSIS — Z79899 Other long term (current) drug therapy: Secondary | ICD-10-CM | POA: Diagnosis not present

## 2019-05-16 DIAGNOSIS — E113311 Type 2 diabetes mellitus with moderate nonproliferative diabetic retinopathy with macular edema, right eye: Secondary | ICD-10-CM

## 2019-05-16 DIAGNOSIS — N186 End stage renal disease: Secondary | ICD-10-CM | POA: Diagnosis not present

## 2019-05-16 DIAGNOSIS — D509 Iron deficiency anemia, unspecified: Secondary | ICD-10-CM | POA: Diagnosis not present

## 2019-05-17 DIAGNOSIS — N2581 Secondary hyperparathyroidism of renal origin: Secondary | ICD-10-CM | POA: Diagnosis not present

## 2019-05-17 DIAGNOSIS — E440 Moderate protein-calorie malnutrition: Secondary | ICD-10-CM | POA: Diagnosis not present

## 2019-05-17 DIAGNOSIS — R17 Unspecified jaundice: Secondary | ICD-10-CM | POA: Diagnosis not present

## 2019-05-17 DIAGNOSIS — D631 Anemia in chronic kidney disease: Secondary | ICD-10-CM | POA: Diagnosis not present

## 2019-05-17 DIAGNOSIS — N186 End stage renal disease: Secondary | ICD-10-CM | POA: Diagnosis not present

## 2019-05-17 DIAGNOSIS — Z79899 Other long term (current) drug therapy: Secondary | ICD-10-CM | POA: Diagnosis not present

## 2019-05-17 DIAGNOSIS — D509 Iron deficiency anemia, unspecified: Secondary | ICD-10-CM | POA: Diagnosis not present

## 2019-05-17 DIAGNOSIS — R82998 Other abnormal findings in urine: Secondary | ICD-10-CM | POA: Diagnosis not present

## 2019-05-17 DIAGNOSIS — Z23 Encounter for immunization: Secondary | ICD-10-CM | POA: Diagnosis not present

## 2019-05-18 DIAGNOSIS — R82998 Other abnormal findings in urine: Secondary | ICD-10-CM | POA: Diagnosis not present

## 2019-05-18 DIAGNOSIS — D631 Anemia in chronic kidney disease: Secondary | ICD-10-CM | POA: Diagnosis not present

## 2019-05-18 DIAGNOSIS — N186 End stage renal disease: Secondary | ICD-10-CM | POA: Diagnosis not present

## 2019-05-18 DIAGNOSIS — D509 Iron deficiency anemia, unspecified: Secondary | ICD-10-CM | POA: Diagnosis not present

## 2019-05-18 DIAGNOSIS — N2581 Secondary hyperparathyroidism of renal origin: Secondary | ICD-10-CM | POA: Diagnosis not present

## 2019-05-18 DIAGNOSIS — Z23 Encounter for immunization: Secondary | ICD-10-CM | POA: Diagnosis not present

## 2019-05-18 DIAGNOSIS — Z79899 Other long term (current) drug therapy: Secondary | ICD-10-CM | POA: Diagnosis not present

## 2019-05-18 DIAGNOSIS — E440 Moderate protein-calorie malnutrition: Secondary | ICD-10-CM | POA: Diagnosis not present

## 2019-05-18 DIAGNOSIS — R17 Unspecified jaundice: Secondary | ICD-10-CM | POA: Diagnosis not present

## 2019-05-19 DIAGNOSIS — Z23 Encounter for immunization: Secondary | ICD-10-CM | POA: Diagnosis not present

## 2019-05-19 DIAGNOSIS — R17 Unspecified jaundice: Secondary | ICD-10-CM | POA: Diagnosis not present

## 2019-05-19 DIAGNOSIS — Z79899 Other long term (current) drug therapy: Secondary | ICD-10-CM | POA: Diagnosis not present

## 2019-05-19 DIAGNOSIS — N2581 Secondary hyperparathyroidism of renal origin: Secondary | ICD-10-CM | POA: Diagnosis not present

## 2019-05-19 DIAGNOSIS — N186 End stage renal disease: Secondary | ICD-10-CM | POA: Diagnosis not present

## 2019-05-19 DIAGNOSIS — D631 Anemia in chronic kidney disease: Secondary | ICD-10-CM | POA: Diagnosis not present

## 2019-05-19 DIAGNOSIS — E440 Moderate protein-calorie malnutrition: Secondary | ICD-10-CM | POA: Diagnosis not present

## 2019-05-19 DIAGNOSIS — R82998 Other abnormal findings in urine: Secondary | ICD-10-CM | POA: Diagnosis not present

## 2019-05-19 DIAGNOSIS — D509 Iron deficiency anemia, unspecified: Secondary | ICD-10-CM | POA: Diagnosis not present

## 2019-05-20 ENCOUNTER — Ambulatory Visit
Admission: RE | Admit: 2019-05-20 | Discharge: 2019-05-20 | Disposition: A | Discharge status: Home / Self Care | Payer: Medicare Other | Source: Ambulatory Visit | Attending: Gastroenterology | Admitting: Gastroenterology

## 2019-05-20 ENCOUNTER — Other Ambulatory Visit: Payer: Self-pay

## 2019-05-20 DIAGNOSIS — C7A00 Malignant carcinoid tumor of unspecified site: Secondary | ICD-10-CM

## 2019-05-20 DIAGNOSIS — R82998 Other abnormal findings in urine: Secondary | ICD-10-CM | POA: Diagnosis not present

## 2019-05-20 DIAGNOSIS — N2581 Secondary hyperparathyroidism of renal origin: Secondary | ICD-10-CM | POA: Diagnosis not present

## 2019-05-20 DIAGNOSIS — Z23 Encounter for immunization: Secondary | ICD-10-CM | POA: Diagnosis not present

## 2019-05-20 DIAGNOSIS — N186 End stage renal disease: Secondary | ICD-10-CM | POA: Diagnosis not present

## 2019-05-20 DIAGNOSIS — E440 Moderate protein-calorie malnutrition: Secondary | ICD-10-CM | POA: Diagnosis not present

## 2019-05-20 DIAGNOSIS — R17 Unspecified jaundice: Secondary | ICD-10-CM | POA: Diagnosis not present

## 2019-05-20 DIAGNOSIS — D509 Iron deficiency anemia, unspecified: Secondary | ICD-10-CM | POA: Diagnosis not present

## 2019-05-20 DIAGNOSIS — Z992 Dependence on renal dialysis: Secondary | ICD-10-CM | POA: Diagnosis not present

## 2019-05-20 DIAGNOSIS — E1122 Type 2 diabetes mellitus with diabetic chronic kidney disease: Secondary | ICD-10-CM | POA: Diagnosis not present

## 2019-05-20 DIAGNOSIS — D631 Anemia in chronic kidney disease: Secondary | ICD-10-CM | POA: Diagnosis not present

## 2019-05-20 DIAGNOSIS — R188 Other ascites: Secondary | ICD-10-CM | POA: Diagnosis not present

## 2019-05-20 DIAGNOSIS — K8689 Other specified diseases of pancreas: Secondary | ICD-10-CM | POA: Diagnosis not present

## 2019-05-20 DIAGNOSIS — Z79899 Other long term (current) drug therapy: Secondary | ICD-10-CM | POA: Diagnosis not present

## 2019-05-20 MED ORDER — IOPAMIDOL (ISOVUE-300) INJECTION 61%
100.0000 mL | Freq: Once | INTRAVENOUS | Status: AC | PRN
  Administered 2019-05-20: 14:00:00 100 mL via INTRAVENOUS

## 2019-05-21 DIAGNOSIS — E440 Moderate protein-calorie malnutrition: Secondary | ICD-10-CM | POA: Diagnosis not present

## 2019-05-21 DIAGNOSIS — Z4932 Encounter for adequacy testing for peritoneal dialysis: Secondary | ICD-10-CM | POA: Diagnosis not present

## 2019-05-21 DIAGNOSIS — E119 Type 2 diabetes mellitus without complications: Secondary | ICD-10-CM | POA: Diagnosis not present

## 2019-05-21 DIAGNOSIS — N2581 Secondary hyperparathyroidism of renal origin: Secondary | ICD-10-CM | POA: Diagnosis not present

## 2019-05-21 DIAGNOSIS — D631 Anemia in chronic kidney disease: Secondary | ICD-10-CM | POA: Diagnosis not present

## 2019-05-21 DIAGNOSIS — Z23 Encounter for immunization: Secondary | ICD-10-CM | POA: Diagnosis not present

## 2019-05-21 DIAGNOSIS — N186 End stage renal disease: Secondary | ICD-10-CM | POA: Diagnosis not present

## 2019-05-21 DIAGNOSIS — E876 Hypokalemia: Secondary | ICD-10-CM | POA: Diagnosis not present

## 2019-05-22 DIAGNOSIS — D631 Anemia in chronic kidney disease: Secondary | ICD-10-CM | POA: Diagnosis not present

## 2019-05-22 DIAGNOSIS — N2581 Secondary hyperparathyroidism of renal origin: Secondary | ICD-10-CM | POA: Diagnosis not present

## 2019-05-22 DIAGNOSIS — E119 Type 2 diabetes mellitus without complications: Secondary | ICD-10-CM | POA: Diagnosis not present

## 2019-05-22 DIAGNOSIS — N186 End stage renal disease: Secondary | ICD-10-CM | POA: Diagnosis not present

## 2019-05-22 DIAGNOSIS — E440 Moderate protein-calorie malnutrition: Secondary | ICD-10-CM | POA: Diagnosis not present

## 2019-05-22 DIAGNOSIS — E876 Hypokalemia: Secondary | ICD-10-CM | POA: Diagnosis not present

## 2019-05-22 DIAGNOSIS — Z4932 Encounter for adequacy testing for peritoneal dialysis: Secondary | ICD-10-CM | POA: Diagnosis not present

## 2019-05-22 DIAGNOSIS — Z23 Encounter for immunization: Secondary | ICD-10-CM | POA: Diagnosis not present

## 2019-05-23 DIAGNOSIS — E440 Moderate protein-calorie malnutrition: Secondary | ICD-10-CM | POA: Diagnosis not present

## 2019-05-23 DIAGNOSIS — Z4932 Encounter for adequacy testing for peritoneal dialysis: Secondary | ICD-10-CM | POA: Diagnosis not present

## 2019-05-23 DIAGNOSIS — Z23 Encounter for immunization: Secondary | ICD-10-CM | POA: Diagnosis not present

## 2019-05-23 DIAGNOSIS — N2581 Secondary hyperparathyroidism of renal origin: Secondary | ICD-10-CM | POA: Diagnosis not present

## 2019-05-23 DIAGNOSIS — E119 Type 2 diabetes mellitus without complications: Secondary | ICD-10-CM | POA: Diagnosis not present

## 2019-05-23 DIAGNOSIS — E876 Hypokalemia: Secondary | ICD-10-CM | POA: Diagnosis not present

## 2019-05-23 DIAGNOSIS — D631 Anemia in chronic kidney disease: Secondary | ICD-10-CM | POA: Diagnosis not present

## 2019-05-23 DIAGNOSIS — N186 End stage renal disease: Secondary | ICD-10-CM | POA: Diagnosis not present

## 2019-05-24 DIAGNOSIS — Z23 Encounter for immunization: Secondary | ICD-10-CM | POA: Diagnosis not present

## 2019-05-24 DIAGNOSIS — E440 Moderate protein-calorie malnutrition: Secondary | ICD-10-CM | POA: Diagnosis not present

## 2019-05-24 DIAGNOSIS — Z4932 Encounter for adequacy testing for peritoneal dialysis: Secondary | ICD-10-CM | POA: Diagnosis not present

## 2019-05-24 DIAGNOSIS — D631 Anemia in chronic kidney disease: Secondary | ICD-10-CM | POA: Diagnosis not present

## 2019-05-24 DIAGNOSIS — N186 End stage renal disease: Secondary | ICD-10-CM | POA: Diagnosis not present

## 2019-05-24 DIAGNOSIS — N2581 Secondary hyperparathyroidism of renal origin: Secondary | ICD-10-CM | POA: Diagnosis not present

## 2019-05-24 DIAGNOSIS — E876 Hypokalemia: Secondary | ICD-10-CM | POA: Diagnosis not present

## 2019-05-24 DIAGNOSIS — E119 Type 2 diabetes mellitus without complications: Secondary | ICD-10-CM | POA: Diagnosis not present

## 2019-05-25 DIAGNOSIS — Z23 Encounter for immunization: Secondary | ICD-10-CM | POA: Diagnosis not present

## 2019-05-25 DIAGNOSIS — E876 Hypokalemia: Secondary | ICD-10-CM | POA: Diagnosis not present

## 2019-05-25 DIAGNOSIS — E440 Moderate protein-calorie malnutrition: Secondary | ICD-10-CM | POA: Diagnosis not present

## 2019-05-25 DIAGNOSIS — N186 End stage renal disease: Secondary | ICD-10-CM | POA: Diagnosis not present

## 2019-05-25 DIAGNOSIS — D631 Anemia in chronic kidney disease: Secondary | ICD-10-CM | POA: Diagnosis not present

## 2019-05-25 DIAGNOSIS — E119 Type 2 diabetes mellitus without complications: Secondary | ICD-10-CM | POA: Diagnosis not present

## 2019-05-25 DIAGNOSIS — Z4932 Encounter for adequacy testing for peritoneal dialysis: Secondary | ICD-10-CM | POA: Diagnosis not present

## 2019-05-25 DIAGNOSIS — N2581 Secondary hyperparathyroidism of renal origin: Secondary | ICD-10-CM | POA: Diagnosis not present

## 2019-05-26 DIAGNOSIS — E119 Type 2 diabetes mellitus without complications: Secondary | ICD-10-CM | POA: Diagnosis not present

## 2019-05-26 DIAGNOSIS — E440 Moderate protein-calorie malnutrition: Secondary | ICD-10-CM | POA: Diagnosis not present

## 2019-05-26 DIAGNOSIS — E876 Hypokalemia: Secondary | ICD-10-CM | POA: Diagnosis not present

## 2019-05-26 DIAGNOSIS — N2581 Secondary hyperparathyroidism of renal origin: Secondary | ICD-10-CM | POA: Diagnosis not present

## 2019-05-26 DIAGNOSIS — D631 Anemia in chronic kidney disease: Secondary | ICD-10-CM | POA: Diagnosis not present

## 2019-05-26 DIAGNOSIS — Z23 Encounter for immunization: Secondary | ICD-10-CM | POA: Diagnosis not present

## 2019-05-26 DIAGNOSIS — Z4932 Encounter for adequacy testing for peritoneal dialysis: Secondary | ICD-10-CM | POA: Diagnosis not present

## 2019-05-26 DIAGNOSIS — N186 End stage renal disease: Secondary | ICD-10-CM | POA: Diagnosis not present

## 2019-05-27 DIAGNOSIS — N2581 Secondary hyperparathyroidism of renal origin: Secondary | ICD-10-CM | POA: Diagnosis not present

## 2019-05-27 DIAGNOSIS — E119 Type 2 diabetes mellitus without complications: Secondary | ICD-10-CM | POA: Diagnosis not present

## 2019-05-27 DIAGNOSIS — Z23 Encounter for immunization: Secondary | ICD-10-CM | POA: Diagnosis not present

## 2019-05-27 DIAGNOSIS — E440 Moderate protein-calorie malnutrition: Secondary | ICD-10-CM | POA: Diagnosis not present

## 2019-05-27 DIAGNOSIS — E876 Hypokalemia: Secondary | ICD-10-CM | POA: Diagnosis not present

## 2019-05-27 DIAGNOSIS — D631 Anemia in chronic kidney disease: Secondary | ICD-10-CM | POA: Diagnosis not present

## 2019-05-27 DIAGNOSIS — N186 End stage renal disease: Secondary | ICD-10-CM | POA: Diagnosis not present

## 2019-05-27 DIAGNOSIS — D72829 Elevated white blood cell count, unspecified: Secondary | ICD-10-CM | POA: Diagnosis not present

## 2019-05-27 DIAGNOSIS — Z4932 Encounter for adequacy testing for peritoneal dialysis: Secondary | ICD-10-CM | POA: Diagnosis not present

## 2019-05-28 DIAGNOSIS — Z23 Encounter for immunization: Secondary | ICD-10-CM | POA: Diagnosis not present

## 2019-05-28 DIAGNOSIS — E119 Type 2 diabetes mellitus without complications: Secondary | ICD-10-CM | POA: Diagnosis not present

## 2019-05-28 DIAGNOSIS — Z4932 Encounter for adequacy testing for peritoneal dialysis: Secondary | ICD-10-CM | POA: Diagnosis not present

## 2019-05-28 DIAGNOSIS — D631 Anemia in chronic kidney disease: Secondary | ICD-10-CM | POA: Diagnosis not present

## 2019-05-28 DIAGNOSIS — N186 End stage renal disease: Secondary | ICD-10-CM | POA: Diagnosis not present

## 2019-05-28 DIAGNOSIS — N2581 Secondary hyperparathyroidism of renal origin: Secondary | ICD-10-CM | POA: Diagnosis not present

## 2019-05-28 DIAGNOSIS — E876 Hypokalemia: Secondary | ICD-10-CM | POA: Diagnosis not present

## 2019-05-28 DIAGNOSIS — E440 Moderate protein-calorie malnutrition: Secondary | ICD-10-CM | POA: Diagnosis not present

## 2019-05-29 DIAGNOSIS — D631 Anemia in chronic kidney disease: Secondary | ICD-10-CM | POA: Diagnosis not present

## 2019-05-29 DIAGNOSIS — Z4932 Encounter for adequacy testing for peritoneal dialysis: Secondary | ICD-10-CM | POA: Diagnosis not present

## 2019-05-29 DIAGNOSIS — N2581 Secondary hyperparathyroidism of renal origin: Secondary | ICD-10-CM | POA: Diagnosis not present

## 2019-05-29 DIAGNOSIS — E876 Hypokalemia: Secondary | ICD-10-CM | POA: Diagnosis not present

## 2019-05-29 DIAGNOSIS — E440 Moderate protein-calorie malnutrition: Secondary | ICD-10-CM | POA: Diagnosis not present

## 2019-05-29 DIAGNOSIS — Z23 Encounter for immunization: Secondary | ICD-10-CM | POA: Diagnosis not present

## 2019-05-29 DIAGNOSIS — E119 Type 2 diabetes mellitus without complications: Secondary | ICD-10-CM | POA: Diagnosis not present

## 2019-05-29 DIAGNOSIS — N186 End stage renal disease: Secondary | ICD-10-CM | POA: Diagnosis not present

## 2019-05-30 DIAGNOSIS — Z23 Encounter for immunization: Secondary | ICD-10-CM | POA: Diagnosis not present

## 2019-05-30 DIAGNOSIS — E876 Hypokalemia: Secondary | ICD-10-CM | POA: Diagnosis not present

## 2019-05-30 DIAGNOSIS — Z4932 Encounter for adequacy testing for peritoneal dialysis: Secondary | ICD-10-CM | POA: Diagnosis not present

## 2019-05-30 DIAGNOSIS — E119 Type 2 diabetes mellitus without complications: Secondary | ICD-10-CM | POA: Diagnosis not present

## 2019-05-30 DIAGNOSIS — D631 Anemia in chronic kidney disease: Secondary | ICD-10-CM | POA: Diagnosis not present

## 2019-05-30 DIAGNOSIS — N2581 Secondary hyperparathyroidism of renal origin: Secondary | ICD-10-CM | POA: Diagnosis not present

## 2019-05-30 DIAGNOSIS — N186 End stage renal disease: Secondary | ICD-10-CM | POA: Diagnosis not present

## 2019-05-30 DIAGNOSIS — E440 Moderate protein-calorie malnutrition: Secondary | ICD-10-CM | POA: Diagnosis not present

## 2019-05-31 DIAGNOSIS — N186 End stage renal disease: Secondary | ICD-10-CM | POA: Diagnosis not present

## 2019-05-31 DIAGNOSIS — E876 Hypokalemia: Secondary | ICD-10-CM | POA: Diagnosis not present

## 2019-05-31 DIAGNOSIS — E440 Moderate protein-calorie malnutrition: Secondary | ICD-10-CM | POA: Diagnosis not present

## 2019-05-31 DIAGNOSIS — Z4932 Encounter for adequacy testing for peritoneal dialysis: Secondary | ICD-10-CM | POA: Diagnosis not present

## 2019-05-31 DIAGNOSIS — Z23 Encounter for immunization: Secondary | ICD-10-CM | POA: Diagnosis not present

## 2019-05-31 DIAGNOSIS — E119 Type 2 diabetes mellitus without complications: Secondary | ICD-10-CM | POA: Diagnosis not present

## 2019-05-31 DIAGNOSIS — D631 Anemia in chronic kidney disease: Secondary | ICD-10-CM | POA: Diagnosis not present

## 2019-05-31 DIAGNOSIS — N2581 Secondary hyperparathyroidism of renal origin: Secondary | ICD-10-CM | POA: Diagnosis not present

## 2019-06-01 DIAGNOSIS — E119 Type 2 diabetes mellitus without complications: Secondary | ICD-10-CM | POA: Diagnosis not present

## 2019-06-01 DIAGNOSIS — D631 Anemia in chronic kidney disease: Secondary | ICD-10-CM | POA: Diagnosis not present

## 2019-06-01 DIAGNOSIS — Z4932 Encounter for adequacy testing for peritoneal dialysis: Secondary | ICD-10-CM | POA: Diagnosis not present

## 2019-06-01 DIAGNOSIS — N2581 Secondary hyperparathyroidism of renal origin: Secondary | ICD-10-CM | POA: Diagnosis not present

## 2019-06-01 DIAGNOSIS — E876 Hypokalemia: Secondary | ICD-10-CM | POA: Diagnosis not present

## 2019-06-01 DIAGNOSIS — Z23 Encounter for immunization: Secondary | ICD-10-CM | POA: Diagnosis not present

## 2019-06-01 DIAGNOSIS — E440 Moderate protein-calorie malnutrition: Secondary | ICD-10-CM | POA: Diagnosis not present

## 2019-06-01 DIAGNOSIS — N186 End stage renal disease: Secondary | ICD-10-CM | POA: Diagnosis not present

## 2019-06-02 DIAGNOSIS — E119 Type 2 diabetes mellitus without complications: Secondary | ICD-10-CM | POA: Diagnosis not present

## 2019-06-02 DIAGNOSIS — N186 End stage renal disease: Secondary | ICD-10-CM | POA: Diagnosis not present

## 2019-06-02 DIAGNOSIS — E876 Hypokalemia: Secondary | ICD-10-CM | POA: Diagnosis not present

## 2019-06-02 DIAGNOSIS — Z4932 Encounter for adequacy testing for peritoneal dialysis: Secondary | ICD-10-CM | POA: Diagnosis not present

## 2019-06-02 DIAGNOSIS — Z23 Encounter for immunization: Secondary | ICD-10-CM | POA: Diagnosis not present

## 2019-06-02 DIAGNOSIS — N2581 Secondary hyperparathyroidism of renal origin: Secondary | ICD-10-CM | POA: Diagnosis not present

## 2019-06-02 DIAGNOSIS — D631 Anemia in chronic kidney disease: Secondary | ICD-10-CM | POA: Diagnosis not present

## 2019-06-02 DIAGNOSIS — E440 Moderate protein-calorie malnutrition: Secondary | ICD-10-CM | POA: Diagnosis not present

## 2019-06-03 DIAGNOSIS — E876 Hypokalemia: Secondary | ICD-10-CM | POA: Diagnosis not present

## 2019-06-03 DIAGNOSIS — Z23 Encounter for immunization: Secondary | ICD-10-CM | POA: Diagnosis not present

## 2019-06-03 DIAGNOSIS — Z4932 Encounter for adequacy testing for peritoneal dialysis: Secondary | ICD-10-CM | POA: Diagnosis not present

## 2019-06-03 DIAGNOSIS — E440 Moderate protein-calorie malnutrition: Secondary | ICD-10-CM | POA: Diagnosis not present

## 2019-06-03 DIAGNOSIS — E119 Type 2 diabetes mellitus without complications: Secondary | ICD-10-CM | POA: Diagnosis not present

## 2019-06-03 DIAGNOSIS — N2581 Secondary hyperparathyroidism of renal origin: Secondary | ICD-10-CM | POA: Diagnosis not present

## 2019-06-03 DIAGNOSIS — D631 Anemia in chronic kidney disease: Secondary | ICD-10-CM | POA: Diagnosis not present

## 2019-06-03 DIAGNOSIS — N186 End stage renal disease: Secondary | ICD-10-CM | POA: Diagnosis not present

## 2019-06-04 ENCOUNTER — Telehealth: Payer: Self-pay | Admitting: Oncology

## 2019-06-04 DIAGNOSIS — Z4932 Encounter for adequacy testing for peritoneal dialysis: Secondary | ICD-10-CM | POA: Diagnosis not present

## 2019-06-04 DIAGNOSIS — E119 Type 2 diabetes mellitus without complications: Secondary | ICD-10-CM | POA: Diagnosis not present

## 2019-06-04 DIAGNOSIS — E876 Hypokalemia: Secondary | ICD-10-CM | POA: Diagnosis not present

## 2019-06-04 DIAGNOSIS — D631 Anemia in chronic kidney disease: Secondary | ICD-10-CM | POA: Diagnosis not present

## 2019-06-04 DIAGNOSIS — E440 Moderate protein-calorie malnutrition: Secondary | ICD-10-CM | POA: Diagnosis not present

## 2019-06-04 DIAGNOSIS — N186 End stage renal disease: Secondary | ICD-10-CM | POA: Diagnosis not present

## 2019-06-04 DIAGNOSIS — N2581 Secondary hyperparathyroidism of renal origin: Secondary | ICD-10-CM | POA: Diagnosis not present

## 2019-06-04 DIAGNOSIS — Z23 Encounter for immunization: Secondary | ICD-10-CM | POA: Diagnosis not present

## 2019-06-04 NOTE — Telephone Encounter (Signed)
Received a new pt referral from Dr. Rosalie Gums for mild enlargement of gastrohepatic ligament lymphadenopathy otherwise no evidence of gastric disease. I cld and spoke to Eileen Perez and scheduled her to see Dr. Benay Spice on 7/28 at 2pm. She's been made aware to arrive 20 minutes early. I provided the location and address to our facility.

## 2019-06-05 DIAGNOSIS — E440 Moderate protein-calorie malnutrition: Secondary | ICD-10-CM | POA: Diagnosis not present

## 2019-06-05 DIAGNOSIS — D631 Anemia in chronic kidney disease: Secondary | ICD-10-CM | POA: Diagnosis not present

## 2019-06-05 DIAGNOSIS — E119 Type 2 diabetes mellitus without complications: Secondary | ICD-10-CM | POA: Diagnosis not present

## 2019-06-05 DIAGNOSIS — N2581 Secondary hyperparathyroidism of renal origin: Secondary | ICD-10-CM | POA: Diagnosis not present

## 2019-06-05 DIAGNOSIS — Z4932 Encounter for adequacy testing for peritoneal dialysis: Secondary | ICD-10-CM | POA: Diagnosis not present

## 2019-06-05 DIAGNOSIS — N186 End stage renal disease: Secondary | ICD-10-CM | POA: Diagnosis not present

## 2019-06-05 DIAGNOSIS — E876 Hypokalemia: Secondary | ICD-10-CM | POA: Diagnosis not present

## 2019-06-05 DIAGNOSIS — Z23 Encounter for immunization: Secondary | ICD-10-CM | POA: Diagnosis not present

## 2019-06-06 DIAGNOSIS — Z23 Encounter for immunization: Secondary | ICD-10-CM | POA: Diagnosis not present

## 2019-06-06 DIAGNOSIS — D631 Anemia in chronic kidney disease: Secondary | ICD-10-CM | POA: Diagnosis not present

## 2019-06-06 DIAGNOSIS — E119 Type 2 diabetes mellitus without complications: Secondary | ICD-10-CM | POA: Diagnosis not present

## 2019-06-06 DIAGNOSIS — Z4932 Encounter for adequacy testing for peritoneal dialysis: Secondary | ICD-10-CM | POA: Diagnosis not present

## 2019-06-06 DIAGNOSIS — E876 Hypokalemia: Secondary | ICD-10-CM | POA: Diagnosis not present

## 2019-06-06 DIAGNOSIS — E440 Moderate protein-calorie malnutrition: Secondary | ICD-10-CM | POA: Diagnosis not present

## 2019-06-06 DIAGNOSIS — N2581 Secondary hyperparathyroidism of renal origin: Secondary | ICD-10-CM | POA: Diagnosis not present

## 2019-06-06 DIAGNOSIS — N186 End stage renal disease: Secondary | ICD-10-CM | POA: Diagnosis not present

## 2019-06-07 DIAGNOSIS — N186 End stage renal disease: Secondary | ICD-10-CM | POA: Diagnosis not present

## 2019-06-07 DIAGNOSIS — Z4932 Encounter for adequacy testing for peritoneal dialysis: Secondary | ICD-10-CM | POA: Diagnosis not present

## 2019-06-07 DIAGNOSIS — Z23 Encounter for immunization: Secondary | ICD-10-CM | POA: Diagnosis not present

## 2019-06-07 DIAGNOSIS — E440 Moderate protein-calorie malnutrition: Secondary | ICD-10-CM | POA: Diagnosis not present

## 2019-06-07 DIAGNOSIS — E876 Hypokalemia: Secondary | ICD-10-CM | POA: Diagnosis not present

## 2019-06-07 DIAGNOSIS — E119 Type 2 diabetes mellitus without complications: Secondary | ICD-10-CM | POA: Diagnosis not present

## 2019-06-07 DIAGNOSIS — N2581 Secondary hyperparathyroidism of renal origin: Secondary | ICD-10-CM | POA: Diagnosis not present

## 2019-06-07 DIAGNOSIS — D631 Anemia in chronic kidney disease: Secondary | ICD-10-CM | POA: Diagnosis not present

## 2019-06-08 DIAGNOSIS — N2581 Secondary hyperparathyroidism of renal origin: Secondary | ICD-10-CM | POA: Diagnosis not present

## 2019-06-08 DIAGNOSIS — E876 Hypokalemia: Secondary | ICD-10-CM | POA: Diagnosis not present

## 2019-06-08 DIAGNOSIS — E440 Moderate protein-calorie malnutrition: Secondary | ICD-10-CM | POA: Diagnosis not present

## 2019-06-08 DIAGNOSIS — Z23 Encounter for immunization: Secondary | ICD-10-CM | POA: Diagnosis not present

## 2019-06-08 DIAGNOSIS — D631 Anemia in chronic kidney disease: Secondary | ICD-10-CM | POA: Diagnosis not present

## 2019-06-08 DIAGNOSIS — E119 Type 2 diabetes mellitus without complications: Secondary | ICD-10-CM | POA: Diagnosis not present

## 2019-06-08 DIAGNOSIS — N186 End stage renal disease: Secondary | ICD-10-CM | POA: Diagnosis not present

## 2019-06-08 DIAGNOSIS — Z4932 Encounter for adequacy testing for peritoneal dialysis: Secondary | ICD-10-CM | POA: Diagnosis not present

## 2019-06-09 DIAGNOSIS — E440 Moderate protein-calorie malnutrition: Secondary | ICD-10-CM | POA: Diagnosis not present

## 2019-06-09 DIAGNOSIS — N2581 Secondary hyperparathyroidism of renal origin: Secondary | ICD-10-CM | POA: Diagnosis not present

## 2019-06-09 DIAGNOSIS — N186 End stage renal disease: Secondary | ICD-10-CM | POA: Diagnosis not present

## 2019-06-09 DIAGNOSIS — Z23 Encounter for immunization: Secondary | ICD-10-CM | POA: Diagnosis not present

## 2019-06-09 DIAGNOSIS — E119 Type 2 diabetes mellitus without complications: Secondary | ICD-10-CM | POA: Diagnosis not present

## 2019-06-09 DIAGNOSIS — E876 Hypokalemia: Secondary | ICD-10-CM | POA: Diagnosis not present

## 2019-06-09 DIAGNOSIS — Z4932 Encounter for adequacy testing for peritoneal dialysis: Secondary | ICD-10-CM | POA: Diagnosis not present

## 2019-06-09 DIAGNOSIS — D631 Anemia in chronic kidney disease: Secondary | ICD-10-CM | POA: Diagnosis not present

## 2019-06-10 ENCOUNTER — Ambulatory Visit: Payer: Self-pay | Admitting: Adult Health

## 2019-06-10 DIAGNOSIS — D631 Anemia in chronic kidney disease: Secondary | ICD-10-CM | POA: Diagnosis not present

## 2019-06-10 DIAGNOSIS — N2581 Secondary hyperparathyroidism of renal origin: Secondary | ICD-10-CM | POA: Diagnosis not present

## 2019-06-10 DIAGNOSIS — Z23 Encounter for immunization: Secondary | ICD-10-CM | POA: Diagnosis not present

## 2019-06-10 DIAGNOSIS — E119 Type 2 diabetes mellitus without complications: Secondary | ICD-10-CM | POA: Diagnosis not present

## 2019-06-10 DIAGNOSIS — E876 Hypokalemia: Secondary | ICD-10-CM | POA: Diagnosis not present

## 2019-06-10 DIAGNOSIS — Z4932 Encounter for adequacy testing for peritoneal dialysis: Secondary | ICD-10-CM | POA: Diagnosis not present

## 2019-06-10 DIAGNOSIS — N186 End stage renal disease: Secondary | ICD-10-CM | POA: Diagnosis not present

## 2019-06-10 DIAGNOSIS — E440 Moderate protein-calorie malnutrition: Secondary | ICD-10-CM | POA: Diagnosis not present

## 2019-06-11 DIAGNOSIS — E119 Type 2 diabetes mellitus without complications: Secondary | ICD-10-CM | POA: Diagnosis not present

## 2019-06-11 DIAGNOSIS — D631 Anemia in chronic kidney disease: Secondary | ICD-10-CM | POA: Diagnosis not present

## 2019-06-11 DIAGNOSIS — E440 Moderate protein-calorie malnutrition: Secondary | ICD-10-CM | POA: Diagnosis not present

## 2019-06-11 DIAGNOSIS — E876 Hypokalemia: Secondary | ICD-10-CM | POA: Diagnosis not present

## 2019-06-11 DIAGNOSIS — Z4932 Encounter for adequacy testing for peritoneal dialysis: Secondary | ICD-10-CM | POA: Diagnosis not present

## 2019-06-11 DIAGNOSIS — N186 End stage renal disease: Secondary | ICD-10-CM | POA: Diagnosis not present

## 2019-06-11 DIAGNOSIS — Z23 Encounter for immunization: Secondary | ICD-10-CM | POA: Diagnosis not present

## 2019-06-11 DIAGNOSIS — N2581 Secondary hyperparathyroidism of renal origin: Secondary | ICD-10-CM | POA: Diagnosis not present

## 2019-06-12 DIAGNOSIS — Z4932 Encounter for adequacy testing for peritoneal dialysis: Secondary | ICD-10-CM | POA: Diagnosis not present

## 2019-06-12 DIAGNOSIS — N186 End stage renal disease: Secondary | ICD-10-CM | POA: Diagnosis not present

## 2019-06-12 DIAGNOSIS — N2581 Secondary hyperparathyroidism of renal origin: Secondary | ICD-10-CM | POA: Diagnosis not present

## 2019-06-12 DIAGNOSIS — E876 Hypokalemia: Secondary | ICD-10-CM | POA: Diagnosis not present

## 2019-06-12 DIAGNOSIS — E440 Moderate protein-calorie malnutrition: Secondary | ICD-10-CM | POA: Diagnosis not present

## 2019-06-12 DIAGNOSIS — Z23 Encounter for immunization: Secondary | ICD-10-CM | POA: Diagnosis not present

## 2019-06-12 DIAGNOSIS — D631 Anemia in chronic kidney disease: Secondary | ICD-10-CM | POA: Diagnosis not present

## 2019-06-12 DIAGNOSIS — E119 Type 2 diabetes mellitus without complications: Secondary | ICD-10-CM | POA: Diagnosis not present

## 2019-06-13 DIAGNOSIS — E119 Type 2 diabetes mellitus without complications: Secondary | ICD-10-CM | POA: Diagnosis not present

## 2019-06-13 DIAGNOSIS — Z4932 Encounter for adequacy testing for peritoneal dialysis: Secondary | ICD-10-CM | POA: Diagnosis not present

## 2019-06-13 DIAGNOSIS — K449 Diaphragmatic hernia without obstruction or gangrene: Secondary | ICD-10-CM | POA: Diagnosis not present

## 2019-06-13 DIAGNOSIS — N186 End stage renal disease: Secondary | ICD-10-CM | POA: Diagnosis not present

## 2019-06-13 DIAGNOSIS — K229 Disease of esophagus, unspecified: Secondary | ICD-10-CM | POA: Diagnosis not present

## 2019-06-13 DIAGNOSIS — N2581 Secondary hyperparathyroidism of renal origin: Secondary | ICD-10-CM | POA: Diagnosis not present

## 2019-06-13 DIAGNOSIS — K222 Esophageal obstruction: Secondary | ICD-10-CM | POA: Diagnosis not present

## 2019-06-13 DIAGNOSIS — D631 Anemia in chronic kidney disease: Secondary | ICD-10-CM | POA: Diagnosis not present

## 2019-06-13 DIAGNOSIS — K2940 Chronic atrophic gastritis without bleeding: Secondary | ICD-10-CM | POA: Diagnosis not present

## 2019-06-13 DIAGNOSIS — E876 Hypokalemia: Secondary | ICD-10-CM | POA: Diagnosis not present

## 2019-06-13 DIAGNOSIS — K317 Polyp of stomach and duodenum: Secondary | ICD-10-CM | POA: Diagnosis not present

## 2019-06-13 DIAGNOSIS — K209 Esophagitis, unspecified: Secondary | ICD-10-CM | POA: Diagnosis not present

## 2019-06-13 DIAGNOSIS — K2970 Gastritis, unspecified, without bleeding: Secondary | ICD-10-CM | POA: Diagnosis not present

## 2019-06-13 DIAGNOSIS — E440 Moderate protein-calorie malnutrition: Secondary | ICD-10-CM | POA: Diagnosis not present

## 2019-06-13 DIAGNOSIS — K210 Gastro-esophageal reflux disease with esophagitis: Secondary | ICD-10-CM | POA: Diagnosis not present

## 2019-06-13 DIAGNOSIS — Z23 Encounter for immunization: Secondary | ICD-10-CM | POA: Diagnosis not present

## 2019-06-14 DIAGNOSIS — N2581 Secondary hyperparathyroidism of renal origin: Secondary | ICD-10-CM | POA: Diagnosis not present

## 2019-06-14 DIAGNOSIS — Z23 Encounter for immunization: Secondary | ICD-10-CM | POA: Diagnosis not present

## 2019-06-14 DIAGNOSIS — E440 Moderate protein-calorie malnutrition: Secondary | ICD-10-CM | POA: Diagnosis not present

## 2019-06-14 DIAGNOSIS — E876 Hypokalemia: Secondary | ICD-10-CM | POA: Diagnosis not present

## 2019-06-14 DIAGNOSIS — D631 Anemia in chronic kidney disease: Secondary | ICD-10-CM | POA: Diagnosis not present

## 2019-06-14 DIAGNOSIS — E119 Type 2 diabetes mellitus without complications: Secondary | ICD-10-CM | POA: Diagnosis not present

## 2019-06-14 DIAGNOSIS — Z4932 Encounter for adequacy testing for peritoneal dialysis: Secondary | ICD-10-CM | POA: Diagnosis not present

## 2019-06-14 DIAGNOSIS — N186 End stage renal disease: Secondary | ICD-10-CM | POA: Diagnosis not present

## 2019-06-15 DIAGNOSIS — D631 Anemia in chronic kidney disease: Secondary | ICD-10-CM | POA: Diagnosis not present

## 2019-06-15 DIAGNOSIS — Z23 Encounter for immunization: Secondary | ICD-10-CM | POA: Diagnosis not present

## 2019-06-15 DIAGNOSIS — E876 Hypokalemia: Secondary | ICD-10-CM | POA: Diagnosis not present

## 2019-06-15 DIAGNOSIS — Z4932 Encounter for adequacy testing for peritoneal dialysis: Secondary | ICD-10-CM | POA: Diagnosis not present

## 2019-06-15 DIAGNOSIS — E119 Type 2 diabetes mellitus without complications: Secondary | ICD-10-CM | POA: Diagnosis not present

## 2019-06-15 DIAGNOSIS — E440 Moderate protein-calorie malnutrition: Secondary | ICD-10-CM | POA: Diagnosis not present

## 2019-06-15 DIAGNOSIS — N186 End stage renal disease: Secondary | ICD-10-CM | POA: Diagnosis not present

## 2019-06-15 DIAGNOSIS — N2581 Secondary hyperparathyroidism of renal origin: Secondary | ICD-10-CM | POA: Diagnosis not present

## 2019-06-16 DIAGNOSIS — N186 End stage renal disease: Secondary | ICD-10-CM | POA: Diagnosis not present

## 2019-06-16 DIAGNOSIS — E440 Moderate protein-calorie malnutrition: Secondary | ICD-10-CM | POA: Diagnosis not present

## 2019-06-16 DIAGNOSIS — E876 Hypokalemia: Secondary | ICD-10-CM | POA: Diagnosis not present

## 2019-06-16 DIAGNOSIS — D631 Anemia in chronic kidney disease: Secondary | ICD-10-CM | POA: Diagnosis not present

## 2019-06-16 DIAGNOSIS — N2581 Secondary hyperparathyroidism of renal origin: Secondary | ICD-10-CM | POA: Diagnosis not present

## 2019-06-16 DIAGNOSIS — Z23 Encounter for immunization: Secondary | ICD-10-CM | POA: Diagnosis not present

## 2019-06-16 DIAGNOSIS — E119 Type 2 diabetes mellitus without complications: Secondary | ICD-10-CM | POA: Diagnosis not present

## 2019-06-16 DIAGNOSIS — Z4932 Encounter for adequacy testing for peritoneal dialysis: Secondary | ICD-10-CM | POA: Diagnosis not present

## 2019-06-17 ENCOUNTER — Inpatient Hospital Stay: Payer: Medicare Other | Attending: Oncology | Admitting: Oncology

## 2019-06-17 ENCOUNTER — Other Ambulatory Visit: Payer: Self-pay

## 2019-06-17 VITALS — BP 132/68 | HR 75 | Temp 98.7°F | Resp 16 | Ht 61.0 in | Wt 140.2 lb

## 2019-06-17 DIAGNOSIS — Z7982 Long term (current) use of aspirin: Secondary | ICD-10-CM

## 2019-06-17 DIAGNOSIS — E440 Moderate protein-calorie malnutrition: Secondary | ICD-10-CM | POA: Diagnosis not present

## 2019-06-17 DIAGNOSIS — M5030 Other cervical disc degeneration, unspecified cervical region: Secondary | ICD-10-CM | POA: Diagnosis not present

## 2019-06-17 DIAGNOSIS — Z79899 Other long term (current) drug therapy: Secondary | ICD-10-CM

## 2019-06-17 DIAGNOSIS — E876 Hypokalemia: Secondary | ICD-10-CM | POA: Diagnosis not present

## 2019-06-17 DIAGNOSIS — N186 End stage renal disease: Secondary | ICD-10-CM | POA: Diagnosis not present

## 2019-06-17 DIAGNOSIS — Z9225 Personal history of immunosupression therapy: Secondary | ICD-10-CM

## 2019-06-17 DIAGNOSIS — Z4932 Encounter for adequacy testing for peritoneal dialysis: Secondary | ICD-10-CM | POA: Diagnosis not present

## 2019-06-17 DIAGNOSIS — C9110 Chronic lymphocytic leukemia of B-cell type not having achieved remission: Secondary | ICD-10-CM | POA: Diagnosis not present

## 2019-06-17 DIAGNOSIS — Z9221 Personal history of antineoplastic chemotherapy: Secondary | ICD-10-CM

## 2019-06-17 DIAGNOSIS — C8599 Non-Hodgkin lymphoma, unspecified, extranodal and solid organ sites: Secondary | ICD-10-CM

## 2019-06-17 DIAGNOSIS — Z23 Encounter for immunization: Secondary | ICD-10-CM | POA: Diagnosis not present

## 2019-06-17 DIAGNOSIS — E119 Type 2 diabetes mellitus without complications: Secondary | ICD-10-CM | POA: Diagnosis not present

## 2019-06-17 DIAGNOSIS — N2581 Secondary hyperparathyroidism of renal origin: Secondary | ICD-10-CM | POA: Diagnosis not present

## 2019-06-17 DIAGNOSIS — D631 Anemia in chronic kidney disease: Secondary | ICD-10-CM | POA: Diagnosis not present

## 2019-06-17 DIAGNOSIS — C7A092 Malignant carcinoid tumor of the stomach: Secondary | ICD-10-CM

## 2019-06-17 NOTE — Patient Instructions (Signed)
Please provide copy of your Medical Advanced Directive/Living Will at your next office visit to have scanned into your medical record.

## 2019-06-17 NOTE — Progress Notes (Signed)
[place] New Patient Consult   Requesting EH:UDJSH Brahmbhatt  Eileen Perez 78 y.o.  1941/03/29    Reason for Consult: Carcinoid tumor involving a gastric polyp   HPI: Eileen Perez was referred to Dr. Alessandra Bevels for evaluation of anemia.  She had a previous history of iron deficiency anemia and underwent a GI evaluation in 2013.  She was noted to have nonbleeding gastric AVMs. She was taken for an upper endoscopy on 04/25/2019.  Superficial ulcers with no bleeding found in the distal esophagus.  A nonobstructing Schatzki ring was noted at the GE junction.  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  A small nodule was noted at the upper esophageal sphincter.  A single 15 mm polyp was found in the gastric body.  The polyp was removed.  The area was tattooed.  Biopsies were taken at diffuse mild inflammation of the stomach.  A single polyp was found in the gastric cardia.  A polypectomy was not attempted.  Biopsies were taken of small submucosal nodules in the second portion of the duodenum.  The pathology [id number]) revealed no evidence of malignancy involving the duodenum and stomach biopsies.  The biopsy of the gastric polyp revealed a tubular adenoma with a well-differentiated neuroendocrine tumor.  The KI-67 returned at 1%.  A colonoscopy 04/2019 revealed 2 sessile polyps in the sigmoid colon.  The polyps were removed.  The pathology from the sigmoid polyps revealed tubular adenomas.  She was taken to repeat upper endoscopy on 05/14/2019.  She was found to have a nonobstructing Schatzki ring.  A post polypectomy scar and tattoo were noted in the gastric body C.  1 gastric polyp was resected and retrieved.  The pathology is pending.   She reports a 23-monthhistory of solid dysphagia.  This improved following the recent endoscopy procedure.   Past Medical History:  Diagnosis Date  . Anemia   . Arthritis    Osteoarthritis  . Chronic kidney disease    Chronic  renal insufficiency, peritoneal dialysis qday sees dr gMoshe Cipro . Diabetes mellitus    Pre  . Diverticulosis   . GERD (gastroesophageal reflux disease)   . Gout   . Hiatal hernia   . History of blood transfusion   . Hyperlipidemia   . Hypertension   . Osteopenia   . Pancreatic cyst 1999  . Thyroid disease    Hyperparathyroid     .  G3 P3  Past Surgical History:  Procedure Laterality Date  . BIOPSY  04/25/2019   Procedure: BIOPSY;  Surgeon: BOtis Brace MD;  Location: [facility];  Service: Gastroenterology;;  . CHOLECYSTECTOMY    . COLONOSCOPY  03/21/12   Next one in 2018  . COLONOSCOPY WITH PROPOFOL N/A 04/25/2019   Procedure: COLONOSCOPY WITH PROPOFOL;  Surgeon: BOtis Brace MD;  Location: [facility];  Service: Gastroenterology;  Laterality: N/A;  . ESOPHAGOGASTRODUODENOSCOPY (EGD) WITH PROPOFOL N/A 04/25/2019   Procedure: ESOPHAGOGASTRODUODENOSCOPY (EGD) WITH PROPOFOL;  Surgeon: BOtis Brace MD;  Location: [facility];  Service: Gastroenterology;  Laterality: N/A;  . EYE SURGERY Left    cataract extraction with IOL  . INSERTION OF DIALYSIS CATHETER N/A 02/15/2017   Procedure: REVISION OF DIALYSIS CATHETER;  Surgeon: JAlgernon Huxley MD;  Location: [facility];  Service: Cardiovascular;  Laterality: N/A;  . JOINT REPLACEMENT  2012   left knee  . left knee replacement     . PANCREATIC CYST EXCISION  1999  . POLYPECTOMY  04/25/2019  Procedure: POLYPECTOMY;  Surgeon: Otis Brace, MD;  Location: [facility];  Service: Gastroenterology;;  . SCLEROTHERAPY  04/25/2019   Procedure: SCLEROTHERAPY;  Surgeon: Otis Brace, MD;  Location: [facility];  Service: Gastroenterology;;  . TOTAL HIP ARTHROPLASTY Right 05/26/2015   Procedure: RIGHT TOTAL HIP ARTHROPLASTY ANTERIOR APPROACH;  Surgeon: Gaynelle Arabian, MD;  Location: [facility];  Service: Orthopedics;  Laterality: Right;  . TOTAL KNEE ARTHROPLASTY Right 06/09/2013   Procedure: RIGHT TOTAL KNEE ARTHROPLASTY;  Surgeon:  Gearlean Alf, MD;  Location: [facility];  Service: Orthopedics;  Laterality: Right;    Medications: Reviewed  Allergies:  Allergies  Allergen Reactions  . Ace Inhibitors     unknown  . Nsaids     unknown    Family history: Her mother had "cancer ".  Her brother had "cancer ".  Social History:   She lives alone in [place].  She is retired from work as a Art gallery manager.  She does not use cigarettes.  She reports social alcohol use.  She has received red cell transfusions in the past.  No risk factor for HIV or hepatitis.  ROS:   Positives include: Solid dysphagia for 2 months  A complete ROS was otherwise negative.  Physical Exam:  Blood pressure 132/68, pulse 75, temperature 98.7 F (37.1 C), temperature source Temporal, resp. rate 16, height 5' 1"  (1.549 m), weight 140 lb 3.2 oz (63.6 kg), SpO2 100 %. Limited physical examination secondary to distancing with the COVID pandemic HEENT: Neck without mass Cardiac: Regular rate and rhythm, no murmur or gallop Abdomen: No hepatosplenomegaly, no mass, nontender, left abdomen dialysis catheter  Vascular: Trace edema at the lower leg and ankle bilaterally Lymph nodes: No cervical, supraclavicular, axillary, or inguinal nodes Neurologic: Alert and oriented, motor exam appears intact in the upper and lower extremities bilaterally Skin: No rash   LAB:  CBC  Lab Results  Component Value Date   WBC 6.7 03/25/2019   HGB 11.9 (L) 04/25/2019   HCT 35.0 (L) 04/25/2019   MCV 94.0 03/25/2019   PLT 290 03/25/2019   NEUTROABS 4,114 03/25/2019        CMP  Lab Results  Component Value Date   NA 136 04/25/2019   K 3.1 (L) 04/25/2019   CL 91 (L) 03/25/2019   CO2 29 03/25/2019   GLUCOSE 143 (H) 04/25/2019   BUN 37 (H) 03/25/2019   CREATININE 9.54 (H) 03/25/2019   CALCIUM 8.8 03/25/2019   PROT 6.1 03/25/2019   ALBUMIN 3.7 01/04/2019   AST 20 03/25/2019   ALT 17 03/25/2019   ALKPHOS 163 (H) 01/04/2019   BILITOT 0.4  03/25/2019   GFRNONAA 4 (L) 03/25/2019   GFRAA 4 (L) 03/25/2019       Imaging: CT abdomen/pelvis 05/20/2019-reviewed     Assessment/Plan:   1. 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 Polyps removed 06/13/2019-pathology pending  CT abdomen/pelvis 05/20/2019- 1 cm gastrohepatic ligament node  2. 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   Sigmoid colon polyps-tubular adenomas on colonoscopy 04/25/2019  4.   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   Anemia  6.   Pancreas cyst excision 1999  7.   Gout  8.  Hypertension   Disposition:   Eileen Perez has been diagnosed with a carcinoid tumor involving a resected gastric polyp.  She had another polyp (s?)  removed on 06/13/2019 and the pathology is pending.  She does not have symptoms related to the carcinoid tumor.  I suspect the dysphasia is related to the esophageal web and Schatzki's ring.  There is no evidence of metastatic disease or carcinoid syndrome.  There is no indication for systemic therapy.  I recommend clinical follow-up with Dr.Brahmbatt for surveillance of the stomach.  She will return for an office visit and chromogranin a level in approximately 8 months.  I will follow-up on the pathology from the 06/13/2019 polyp resection.  I will present her case at the GI tumor conference.  Betsy Coder, MD  06/17/2019, 2:21 PM

## 2019-06-18 ENCOUNTER — Telehealth: Payer: Self-pay | Admitting: Oncology

## 2019-06-18 ENCOUNTER — Telehealth: Payer: Self-pay

## 2019-06-18 DIAGNOSIS — Z23 Encounter for immunization: Secondary | ICD-10-CM | POA: Diagnosis not present

## 2019-06-18 DIAGNOSIS — N2581 Secondary hyperparathyroidism of renal origin: Secondary | ICD-10-CM | POA: Diagnosis not present

## 2019-06-18 DIAGNOSIS — Z4932 Encounter for adequacy testing for peritoneal dialysis: Secondary | ICD-10-CM | POA: Diagnosis not present

## 2019-06-18 DIAGNOSIS — E119 Type 2 diabetes mellitus without complications: Secondary | ICD-10-CM | POA: Diagnosis not present

## 2019-06-18 DIAGNOSIS — E440 Moderate protein-calorie malnutrition: Secondary | ICD-10-CM | POA: Diagnosis not present

## 2019-06-18 DIAGNOSIS — E876 Hypokalemia: Secondary | ICD-10-CM | POA: Diagnosis not present

## 2019-06-18 DIAGNOSIS — N186 End stage renal disease: Secondary | ICD-10-CM | POA: Diagnosis not present

## 2019-06-18 DIAGNOSIS — D631 Anemia in chronic kidney disease: Secondary | ICD-10-CM | POA: Diagnosis not present

## 2019-06-18 NOTE — Telephone Encounter (Signed)
Called and left VM on home and cell # requesting call back to discuss role of GI Navigator. Left direct contact number for call back.

## 2019-06-18 NOTE — Telephone Encounter (Signed)
Spoke with patient. She reports being pleased with her care team during initial consult. No questions no navigation needs. Encouraged her to call with questions or concerns.

## 2019-06-18 NOTE — Telephone Encounter (Signed)
Scheduled per los. Mailed printout  °

## 2019-06-19 DIAGNOSIS — N2581 Secondary hyperparathyroidism of renal origin: Secondary | ICD-10-CM | POA: Diagnosis not present

## 2019-06-19 DIAGNOSIS — Z4932 Encounter for adequacy testing for peritoneal dialysis: Secondary | ICD-10-CM | POA: Diagnosis not present

## 2019-06-19 DIAGNOSIS — E876 Hypokalemia: Secondary | ICD-10-CM | POA: Diagnosis not present

## 2019-06-19 DIAGNOSIS — Z23 Encounter for immunization: Secondary | ICD-10-CM | POA: Diagnosis not present

## 2019-06-19 DIAGNOSIS — D631 Anemia in chronic kidney disease: Secondary | ICD-10-CM | POA: Diagnosis not present

## 2019-06-19 DIAGNOSIS — E440 Moderate protein-calorie malnutrition: Secondary | ICD-10-CM | POA: Diagnosis not present

## 2019-06-19 DIAGNOSIS — N186 End stage renal disease: Secondary | ICD-10-CM | POA: Diagnosis not present

## 2019-06-19 DIAGNOSIS — E119 Type 2 diabetes mellitus without complications: Secondary | ICD-10-CM | POA: Diagnosis not present

## 2019-06-20 DIAGNOSIS — E119 Type 2 diabetes mellitus without complications: Secondary | ICD-10-CM | POA: Diagnosis not present

## 2019-06-20 DIAGNOSIS — N186 End stage renal disease: Secondary | ICD-10-CM | POA: Diagnosis not present

## 2019-06-20 DIAGNOSIS — Z23 Encounter for immunization: Secondary | ICD-10-CM | POA: Diagnosis not present

## 2019-06-20 DIAGNOSIS — E440 Moderate protein-calorie malnutrition: Secondary | ICD-10-CM | POA: Diagnosis not present

## 2019-06-20 DIAGNOSIS — E1122 Type 2 diabetes mellitus with diabetic chronic kidney disease: Secondary | ICD-10-CM | POA: Diagnosis not present

## 2019-06-20 DIAGNOSIS — Z4932 Encounter for adequacy testing for peritoneal dialysis: Secondary | ICD-10-CM | POA: Diagnosis not present

## 2019-06-20 DIAGNOSIS — N2581 Secondary hyperparathyroidism of renal origin: Secondary | ICD-10-CM | POA: Diagnosis not present

## 2019-06-20 DIAGNOSIS — E876 Hypokalemia: Secondary | ICD-10-CM | POA: Diagnosis not present

## 2019-06-20 DIAGNOSIS — D631 Anemia in chronic kidney disease: Secondary | ICD-10-CM | POA: Diagnosis not present

## 2019-06-20 DIAGNOSIS — Z992 Dependence on renal dialysis: Secondary | ICD-10-CM | POA: Diagnosis not present

## 2019-06-21 DIAGNOSIS — R17 Unspecified jaundice: Secondary | ICD-10-CM | POA: Diagnosis not present

## 2019-06-21 DIAGNOSIS — D631 Anemia in chronic kidney disease: Secondary | ICD-10-CM | POA: Diagnosis not present

## 2019-06-21 DIAGNOSIS — E440 Moderate protein-calorie malnutrition: Secondary | ICD-10-CM | POA: Diagnosis not present

## 2019-06-21 DIAGNOSIS — Z992 Dependence on renal dialysis: Secondary | ICD-10-CM | POA: Diagnosis not present

## 2019-06-21 DIAGNOSIS — R82998 Other abnormal findings in urine: Secondary | ICD-10-CM | POA: Diagnosis not present

## 2019-06-21 DIAGNOSIS — D509 Iron deficiency anemia, unspecified: Secondary | ICD-10-CM | POA: Diagnosis not present

## 2019-06-21 DIAGNOSIS — Z79899 Other long term (current) drug therapy: Secondary | ICD-10-CM | POA: Diagnosis not present

## 2019-06-21 DIAGNOSIS — N2581 Secondary hyperparathyroidism of renal origin: Secondary | ICD-10-CM | POA: Diagnosis not present

## 2019-06-21 DIAGNOSIS — Z23 Encounter for immunization: Secondary | ICD-10-CM | POA: Diagnosis not present

## 2019-06-21 DIAGNOSIS — N186 End stage renal disease: Secondary | ICD-10-CM | POA: Diagnosis not present

## 2019-06-22 DIAGNOSIS — E440 Moderate protein-calorie malnutrition: Secondary | ICD-10-CM | POA: Diagnosis not present

## 2019-06-22 DIAGNOSIS — D509 Iron deficiency anemia, unspecified: Secondary | ICD-10-CM | POA: Diagnosis not present

## 2019-06-22 DIAGNOSIS — N186 End stage renal disease: Secondary | ICD-10-CM | POA: Diagnosis not present

## 2019-06-22 DIAGNOSIS — R82998 Other abnormal findings in urine: Secondary | ICD-10-CM | POA: Diagnosis not present

## 2019-06-22 DIAGNOSIS — N2581 Secondary hyperparathyroidism of renal origin: Secondary | ICD-10-CM | POA: Diagnosis not present

## 2019-06-22 DIAGNOSIS — D631 Anemia in chronic kidney disease: Secondary | ICD-10-CM | POA: Diagnosis not present

## 2019-06-22 DIAGNOSIS — Z23 Encounter for immunization: Secondary | ICD-10-CM | POA: Diagnosis not present

## 2019-06-22 DIAGNOSIS — R17 Unspecified jaundice: Secondary | ICD-10-CM | POA: Diagnosis not present

## 2019-06-22 DIAGNOSIS — Z992 Dependence on renal dialysis: Secondary | ICD-10-CM | POA: Diagnosis not present

## 2019-06-22 DIAGNOSIS — Z79899 Other long term (current) drug therapy: Secondary | ICD-10-CM | POA: Diagnosis not present

## 2019-06-23 DIAGNOSIS — D509 Iron deficiency anemia, unspecified: Secondary | ICD-10-CM | POA: Diagnosis not present

## 2019-06-23 DIAGNOSIS — N2581 Secondary hyperparathyroidism of renal origin: Secondary | ICD-10-CM | POA: Diagnosis not present

## 2019-06-23 DIAGNOSIS — Z23 Encounter for immunization: Secondary | ICD-10-CM | POA: Diagnosis not present

## 2019-06-23 DIAGNOSIS — N186 End stage renal disease: Secondary | ICD-10-CM | POA: Diagnosis not present

## 2019-06-23 DIAGNOSIS — Z86010 Personal history of colonic polyps: Secondary | ICD-10-CM | POA: Insufficient documentation

## 2019-06-23 DIAGNOSIS — K222 Esophageal obstruction: Secondary | ICD-10-CM | POA: Insufficient documentation

## 2019-06-23 DIAGNOSIS — K210 Gastro-esophageal reflux disease with esophagitis: Secondary | ICD-10-CM | POA: Diagnosis not present

## 2019-06-23 DIAGNOSIS — D3A092 Benign carcinoid tumor of the stomach: Secondary | ICD-10-CM | POA: Insufficient documentation

## 2019-06-23 DIAGNOSIS — R17 Unspecified jaundice: Secondary | ICD-10-CM | POA: Diagnosis not present

## 2019-06-23 DIAGNOSIS — E440 Moderate protein-calorie malnutrition: Secondary | ICD-10-CM | POA: Diagnosis not present

## 2019-06-23 DIAGNOSIS — R82998 Other abnormal findings in urine: Secondary | ICD-10-CM | POA: Diagnosis not present

## 2019-06-23 DIAGNOSIS — Z79899 Other long term (current) drug therapy: Secondary | ICD-10-CM | POA: Diagnosis not present

## 2019-06-23 DIAGNOSIS — K2940 Chronic atrophic gastritis without bleeding: Secondary | ICD-10-CM | POA: Diagnosis not present

## 2019-06-23 DIAGNOSIS — Z992 Dependence on renal dialysis: Secondary | ICD-10-CM | POA: Diagnosis not present

## 2019-06-23 DIAGNOSIS — D631 Anemia in chronic kidney disease: Secondary | ICD-10-CM | POA: Diagnosis not present

## 2019-06-23 NOTE — Progress Notes (Signed)
FOLLOW UP  Assessment and Plan:   Diagnoses and all orders for this visit:  Essential hypertension At goal; continue medication Monitor blood pressure at home; call if consistently over 130/80 or under 90/60 Continue DASH diet.   Reminder to go to the ER if any CP, SOB, nausea, dizziness, severe HA, changes vision/speech, left arm numbness and tingling and jaw pain.  Secondary hyperparathyroidism (CKD) Continue sevelamer Continue follow up with Dr. Moshe Cipro -     CMP WITH GFR  Diabetes mellitus due to underlying condition with diabetic nephropathy, without long-term current use of insulin ([place]) Not treated by medications secondary to ESRD; A1Cs improving with diet and regular dialysis Discussed hypoglycemia symptoms, check sugars if she experiences any of these and to eat a snack and notify office of episodes - could explain her fatigue Continue diet and exercise.  Reminded to get annual eye exams Perform daily foot/skin check, notify office of any concerning changes.  -     Defer A1C as appears getting checked monthly by nephrology; printed lab results presented by patient reviewed - last 5.7% in 05/2019  ESRD (end stage renal disease) ([place]) Continue dialysis and follow up with urology -     CMP WITH GFR  Anemia of chronic Renal Dz -     CBC with Differential/Platelet  Vitamin D deficiency Continue supplementation -     VITAMIN D 25 Hydroxy (Vit-D Deficiency, Fractures)  Hyperlipidemia Currently at goal with omega 3 supplementation and lifestyle Continue to encourage low cholesterol diet and exercise -     Lipid panel -     TSH  Medication management -     CBC with Differential/Platelet -     CMP/GFR -     Magnesium   Overweight (BMI 25.0-29.9) Long discussion about weight loss, diet, and exercise Recommended diet heavy in fruits and veggies and low in animal meats, cheeses, and dairy products, appropriate calorie intake Follow up in 3 months  Carcinoid tumor  of stomach Continue Protonix 40 mg BID per GI;  followed by Dr. Learta Codding, follow up scheduled with GI for repeat EGD  Enrolled in Chronic Care Management  Comprehensive care plan:  Patient/caregiver was given comprehensive care plan We will continue to monitor these goals every 3 months with an office visit and every month by a telephone call  Patient can contact the office any time with the phone number and get to a provider via the answering service or they can use Mychart.   Verbal permission was received from the patient to review comprehensive care management, they understand they have the right to stop CCM services at any time.   The patient is self managing medications at home.  Medications were reviewed with the patient today as well as adherence and potential interactions.   The patient does not need home health services at this time.    Continue diet and meds as discussed. Further disposition pending results of labs. Discussed med's effects and SE's.   Over 30 minutes of exam, counseling, chart review, and critical decision making was performed.   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    She continues to perform at home peritoneal dialysis nightly; and is followed by Dr. Moshe Cipro every two weeks.   She has hx of secondary hyperparathyroidism.  She recently referred to Dr. Rosalie Gums for unexplained acute on  chronic anemia and underwent EGD/colonoscopy which found Schatzki ring at GE junction, web in upper third of esophagus, non-bleeding gastric ulcers and a gastric polyp which revealed a tubular adenoma with a well differentiated neuroendocrine tumor. Colonoscopy revealed 2 tubular adenomas of the sigmoid colon.   She was apparently taken to repeat upper endoscopy on 05/14/2019 - patient presented with copies of report today, scanned into system. She was found to have a nonobstructing Schatzki ring.  A post polypectomy scar and tattoo were noted in the gastric body C.  1 gastric polyp was resected and retrieved.  The pathology is pending. Dr. Learta Codding will be presenting to GI tumor board and has recommended follow up EGD (scheduled in 4 weeks) and chromogranin level check. Has follow up scheduled in 8 months.   she has a diagnosis of GERD which is currently managed by protonix 40 mg BID  she reports symptoms is currently well controlled, and denies breakthrough reflux, burning in chest, hoarseness or cough.    She does report dysphagia has improved, previously was doing liquids only, now just avoiding "bulky" foods.   BMI is Body mass index is 26.41 kg/m., she has been working on diet. Wt Readings from Last 3 Encounters:  06/24/19 139 lb 12.8 oz (63.4 kg)  06/17/19 140 lb 3.2 oz (63.6 kg)  04/25/19 135 lb (61.2 kg)   Her blood pressure has been controlled at home (120s/70s), today their BP is BP: (!) 142/70  She does not currently work out related to the weather and fatigue; she reports she normally would do aerobics/yoga 4 days a week. She denies chest pain, shortness of breath, dizziness.   She is not on cholesterol medication (hasn't been taking fish oil due to dysphagia). Her cholesterol is not at goal. The cholesterol last visit was:   Lab Results  Component Value Date   CHOL 200 (H) 03/20/2019   HDL 43 (L) 03/20/2019   LDLCALC 131 (H) 03/20/2019   TRIG 149 03/20/2019   CHOLHDL 4.7  03/20/2019    She has been working on diet for T2DM, and denies hyperglycemia, hypoglycemia , increased appetite, nausea, paresthesia of the feet, polydipsia and polyuria.  She does not currently check her blood sugars at home, denies symptoms of hypoglycemia. Last A1C in the office was:  Lab Results  Component Value Date   HGBA1C 6.5 (H) 03/20/2019  She reports gets A1C checked ? Monthly by lab for nephrology, presents with lab report drawn 05/26/2019 demonstrating A1C of 5.7%.   Lab Results  Component Value Date   GFRAA 4 (L) 03/25/2019   Patient is on Vitamin D supplement as recommended by nephrology:   Lab Results  Component Value Date   VD25OH 41 03/20/2019     Patient is on allopurinol for gout and does not report a recent flare.  Lab Results  Component Value Date   LABURIC 3.3 03/20/2019   Anemia is manged by nephrology; stopped iron supplement due to high iron levels; she is getting monthly erythropoietin injections.  Lab Results  Component Value Date   WBC 6.7 03/25/2019   HGB 11.9 (L) 04/25/2019   HCT 35.0 (L) 04/25/2019   MCV 94.0 03/25/2019  PLT 290 03/25/2019   Lab Results  Component Value Date   IRON 90 03/25/2019   TIBC 215 (L) 03/25/2019   FERRITIN 1,467 (H) 03/25/2019     Current Medications:  Current Outpatient Medications on File Prior to Visit  Medication Sig  . allopurinol (ZYLOPRIM) 300 MG tablet TAKE 1/2 TO 1 TABLET BY  MOUTH DAILY AS DIRECTED (Patient taking differently: TAKE 1/2 TO 1 TABLET BY  MOUTH DAILY AS DIRECTED)  . aspirin 81 MG tablet Take 81 mg by mouth at bedtime.   Marland Kitchen b complex-vitamin c-folic acid (NEPHRO-VITE) 0.8 MG TABS tablet Take 1 tablet by mouth at bedtime.  . calcitRIOL (ROCALTROL) 0.5 MCG capsule Take 0.5 mcg by mouth daily.   . cinacalcet (SENSIPAR) 30 MG tablet Take 30 mg by mouth daily.   . Flaxseed, Linseed, (FLAXSEED OIL) 1000 MG CAPS Take 1 capsule by mouth daily.   . hydrALAZINE (APRESOLINE) 10 MG tablet TAKE 1 TABLET  BY MOUTH 2  TIMES DAILY.  Marland Kitchen labetalol (NORMODYNE) 200 MG tablet TAKE 1 TABLET BY MOUTH TWO  TIMES DAILY (Patient taking differently: Take 200 mg by mouth 2 (two) times daily. )  . Omega-3 Fatty Acids (FISH OIL) 1200 MG CAPS Take 1,200 mg by mouth daily.   . pantoprazole (PROTONIX) 40 MG tablet Take 1 tablet (40 mg total) by mouth 2 (two) times daily.  . sevelamer carbonate (RENVELA) 800 MG tablet Take 800 mg by mouth 3 (three) times daily with meals.   Marland Kitchen ZINC GLUCONATE PO Take 40 mg by mouth daily.    No current facility-administered medications on file prior to visit.      Allergies:  Allergies  Allergen Reactions  . Ace Inhibitors     unknown  . Nsaids     unknown     Medical History:  Past Medical History:  Diagnosis Date  . Anemia   . Arthritis    Osteoarthritis  . Chronic kidney disease    Chronic renal insufficiency, peritoneal dialysis qday sees dr Moshe Cipro  . Diabetes mellitus    Pre  . Diverticulosis   . GERD (gastroesophageal reflux disease)   . Gout   . Hiatal hernia   . History of blood transfusion   . Hyperlipidemia   . Hypertension   . Osteopenia   . Pancreatic cyst 1999  . Thyroid disease    Hyperparathyroid    Family history- Reviewed and unchanged Social history- Reviewed and unchanged   Review of Systems:  Review of Systems  Constitutional: Positive for malaise/fatigue (afternoon fatigue). Negative for weight loss.  HENT: Negative for hearing loss and tinnitus.   Eyes: Negative for blurred vision and double vision.  Respiratory: Negative for cough, shortness of breath and wheezing.   Cardiovascular: Negative for chest pain, palpitations, orthopnea, claudication and leg swelling.  Gastrointestinal: Negative for abdominal pain, blood in stool, constipation, diarrhea, heartburn, melena, nausea and vomiting.       Dysphagia  Genitourinary: Negative.   Musculoskeletal: Negative for falls, joint pain and myalgias.  Skin: Negative for rash.   Neurological: Positive for tingling (intermittent tingling of bil fingers). Negative for dizziness, sensory change, weakness and headaches.  Endo/Heme/Allergies: Negative for polydipsia.  Psychiatric/Behavioral: Negative for depression and memory loss. The patient has insomnia (Mild intermittent). The patient is not nervous/anxious.   All other systems reviewed and are negative.    Physical Exam: BP (!) 142/70   Pulse 76   Temp (!) 96.6 F (35.9 C)   Wt 139 lb  12.8 oz (63.4 kg)   SpO2 98%   BMI 26.41 kg/m  Wt Readings from Last 3 Encounters:  06/24/19 139 lb 12.8 oz (63.4 kg)  06/17/19 140 lb 3.2 oz (63.6 kg)  04/25/19 135 lb (61.2 kg)   General Appearance: Well nourished, in no apparent distress. Eyes: PERRLA, EOMs, conjunctiva no swelling or erythema Sinuses: No Frontal/maxillary tenderness ENT/Mouth: Ext aud canals clear, TMs without erythema, bulging. No erythema, swelling, or exudate on post pharynx.  Tonsils not swollen or erythematous. Hearing normal.  Neck: Supple, thyroid normal.  Respiratory: Respiratory effort normal, BS equal bilaterally without rales, rhonchi, wheezing or stridor.  Cardio: RRR with no MRGs. Brisk peripheral pulses with scant pitting edema to feet and ankles Abdomen: Soft, + BS.  Non tender, no guarding, rebound, hernias, masses. Lymphatics: Non tender without lymphadenopathy.  Musculoskeletal: Full ROM, 5/5 strength, Normal gait Skin: Warm, dry without rashes, lesions, ecchymosis. Port site on abdomen without injection or discharge.  Neuro: Cranial nerves intact. No cerebellar symptoms. Sensation intact. Strength 5/5 bil fingers/hands/wrists, neg phalen's and Tinnels.   Psych: Awake and oriented X 3, normal affect, Insight and Judgment appropriate.    Izora Ribas, NP 11:57 AM [facility]

## 2019-06-24 ENCOUNTER — Ambulatory Visit (INDEPENDENT_AMBULATORY_CARE_PROVIDER_SITE_OTHER): Payer: Medicare Other | Admitting: Adult Health

## 2019-06-24 ENCOUNTER — Encounter: Payer: Self-pay | Admitting: Adult Health

## 2019-06-24 ENCOUNTER — Other Ambulatory Visit: Payer: Self-pay

## 2019-06-24 VITALS — BP 142/70 | HR 76 | Temp 96.6°F | Wt 139.8 lb

## 2019-06-24 DIAGNOSIS — N2581 Secondary hyperparathyroidism of renal origin: Secondary | ICD-10-CM

## 2019-06-24 DIAGNOSIS — E782 Mixed hyperlipidemia: Secondary | ICD-10-CM | POA: Diagnosis not present

## 2019-06-24 DIAGNOSIS — Z23 Encounter for immunization: Secondary | ICD-10-CM | POA: Diagnosis not present

## 2019-06-24 DIAGNOSIS — E440 Moderate protein-calorie malnutrition: Secondary | ICD-10-CM | POA: Diagnosis not present

## 2019-06-24 DIAGNOSIS — Z860100 Personal history of colon polyps, unspecified: Secondary | ICD-10-CM

## 2019-06-24 DIAGNOSIS — Z789 Other specified health status: Secondary | ICD-10-CM

## 2019-06-24 DIAGNOSIS — I10 Essential (primary) hypertension: Secondary | ICD-10-CM

## 2019-06-24 DIAGNOSIS — E0821 Diabetes mellitus due to underlying condition with diabetic nephropathy: Secondary | ICD-10-CM | POA: Diagnosis not present

## 2019-06-24 DIAGNOSIS — K222 Esophageal obstruction: Secondary | ICD-10-CM

## 2019-06-24 DIAGNOSIS — E559 Vitamin D deficiency, unspecified: Secondary | ICD-10-CM

## 2019-06-24 DIAGNOSIS — Z992 Dependence on renal dialysis: Secondary | ICD-10-CM | POA: Diagnosis not present

## 2019-06-24 DIAGNOSIS — D638 Anemia in other chronic diseases classified elsewhere: Secondary | ICD-10-CM | POA: Diagnosis not present

## 2019-06-24 DIAGNOSIS — N186 End stage renal disease: Secondary | ICD-10-CM | POA: Diagnosis not present

## 2019-06-24 DIAGNOSIS — Z79899 Other long term (current) drug therapy: Secondary | ICD-10-CM

## 2019-06-24 DIAGNOSIS — E663 Overweight: Secondary | ICD-10-CM

## 2019-06-24 DIAGNOSIS — R17 Unspecified jaundice: Secondary | ICD-10-CM | POA: Diagnosis not present

## 2019-06-24 DIAGNOSIS — D631 Anemia in chronic kidney disease: Secondary | ICD-10-CM | POA: Diagnosis not present

## 2019-06-24 DIAGNOSIS — R82998 Other abnormal findings in urine: Secondary | ICD-10-CM | POA: Diagnosis not present

## 2019-06-24 DIAGNOSIS — M1000 Idiopathic gout, unspecified site: Secondary | ICD-10-CM | POA: Diagnosis not present

## 2019-06-24 DIAGNOSIS — D509 Iron deficiency anemia, unspecified: Secondary | ICD-10-CM | POA: Diagnosis not present

## 2019-06-24 NOTE — Patient Instructions (Signed)
Goals     Blood Pressure < 130/80     Check blood pressure daily   Continue to follow DASH diet     Exercise 3-4x per week (20 min per time)     HEMOGLOBIN A1C < 7.0     Record weight daily        Paresthesia Paresthesia is an abnormal burning or prickling sensation. It is usually felt in the hands, arms, legs, or feet. However, it may occur in any part of the body. Usually, paresthesia is not painful. It may feel like:  Tingling or numbness.  Buzzing.  Itching. Paresthesia may occur without any clear cause, or it may be caused by:  Breathing too quickly (hyperventilation).  Pressure on a nerve.  An underlying medical condition.  Side effects of a medication.  Nutritional deficiencies.  Exposure to toxic chemicals. Most people experience temporary (transient) paresthesia at some time in their lives. For some people, it may be long-lasting (chronic) because of an underlying medical condition. If you have paresthesia that lasts a long time, you may need to be evaluated by your health care provider. Follow these instructions at home: Alcohol use   Do not drink alcohol if: ? Your health care provider tells you not to drink. ? You are pregnant, may be pregnant, or are planning to become pregnant.  If you drink alcohol: ? Limit how much you use to:  0-1 drink a day for women.  0-2 drinks a day for men. ? Be aware of how much alcohol is in your drink. In the [place] one drink equals one 12 oz bottle of beer (355 mL), one 5 oz glass of wine (148 mL), or one 1 oz glass of hard liquor (44 mL). Nutrition   Eat a healthy diet. This includes: ? Eating foods that are high in fiber, such as fresh fruits and vegetables, whole grains, and beans. ? Limiting foods that are high in fat and processed sugars, such as fried or sweet foods. General instructions  Take over-the-counter and prescription medicines only as told by your health care provider.  Do not use any products  that contain nicotine or tobacco, such as cigarettes and e-cigarettes. These can keep blood from reaching damaged nerves. If you need help quitting, ask your health care provider.  If you have diabetes, work closely with your health care provider to keep your blood sugar under control.  If you have numbness in your feet: ? Check every day for signs of injury or infection. Watch for redness, warmth, and swelling. ? Wear padded socks and comfortable shoes. These help protect your feet.  Keep all follow-up visits as told by your health care provider. This is important. Contact a health care provider if you:  Have paresthesia that gets worse or does not go away.  Have a burning or prickling feeling that gets worse when you walk.  Have pain, cramps, or dizziness.  Develop a rash. Get help right away if you:  Feel weak.  Have trouble walking or moving.  Have problems with speech, understanding, or vision.  Feel confused.  Cannot control your bladder or bowel movements.  Have numbness after an injury.  Develop new weakness in an arm or leg.  Faint. Summary  Paresthesia is an abnormal burning or prickling sensation that is usually felt in the hands, arms, legs, or feet. It may also occur in other parts of the body.  Paresthesia may occur without any clear cause, or it may be caused  by breathing too quickly (hyperventilation), pressure on a nerve, an underlying medical condition, side effects of a medication, nutritional deficiencies, or exposure to toxic chemicals.  If you have paresthesia that lasts a long time, you may need to be evaluated by your health care provider. This information is not intended to replace advice given to you by your health care provider. Make sure you discuss any questions you have with your health care provider. Document Released: 10/27/2002 Document Revised: 12/02/2018 Document Reviewed: 11/15/2017 Elsevier Patient Education  2020 [employer].

## 2019-06-25 ENCOUNTER — Other Ambulatory Visit: Payer: Self-pay | Admitting: Adult Health

## 2019-06-25 DIAGNOSIS — R82998 Other abnormal findings in urine: Secondary | ICD-10-CM | POA: Diagnosis not present

## 2019-06-25 DIAGNOSIS — Z79899 Other long term (current) drug therapy: Secondary | ICD-10-CM | POA: Diagnosis not present

## 2019-06-25 DIAGNOSIS — D631 Anemia in chronic kidney disease: Secondary | ICD-10-CM | POA: Diagnosis not present

## 2019-06-25 DIAGNOSIS — E440 Moderate protein-calorie malnutrition: Secondary | ICD-10-CM | POA: Diagnosis not present

## 2019-06-25 DIAGNOSIS — D509 Iron deficiency anemia, unspecified: Secondary | ICD-10-CM | POA: Diagnosis not present

## 2019-06-25 DIAGNOSIS — Z992 Dependence on renal dialysis: Secondary | ICD-10-CM | POA: Diagnosis not present

## 2019-06-25 DIAGNOSIS — E782 Mixed hyperlipidemia: Secondary | ICD-10-CM

## 2019-06-25 DIAGNOSIS — N2581 Secondary hyperparathyroidism of renal origin: Secondary | ICD-10-CM | POA: Diagnosis not present

## 2019-06-25 DIAGNOSIS — N186 End stage renal disease: Secondary | ICD-10-CM | POA: Diagnosis not present

## 2019-06-25 DIAGNOSIS — R17 Unspecified jaundice: Secondary | ICD-10-CM | POA: Diagnosis not present

## 2019-06-25 DIAGNOSIS — Z23 Encounter for immunization: Secondary | ICD-10-CM | POA: Diagnosis not present

## 2019-06-25 LAB — COMPLETE METABOLIC PANEL WITH GFR
AG Ratio: 1.3 (calc) (ref 1.0–2.5)
ALT: 13 U/L (ref 6–29)
AST: 19 U/L (ref 10–35)
Albumin: 3.4 g/dL — ABNORMAL LOW (ref 3.6–5.1)
Alkaline phosphatase (APISO): 113 U/L (ref 37–153)
BUN/Creatinine Ratio: 5 (calc) — ABNORMAL LOW (ref 6–22)
BUN: 42 mg/dL — ABNORMAL HIGH (ref 7–25)
CO2: 30 mmol/L (ref 20–32)
Calcium: 9.2 mg/dL (ref 8.6–10.4)
Chloride: 94 mmol/L — ABNORMAL LOW (ref 98–110)
Creat: 8.37 mg/dL — ABNORMAL HIGH (ref 0.60–0.93)
GFR, Est African American: 5 mL/min/{1.73_m2} — ABNORMAL LOW (ref >=60)
GFR, Est Non African American: 4 mL/min/{1.73_m2} — ABNORMAL LOW (ref >=60)
Globulin: 2.6 g/dL (calc) (ref 1.9–3.7)
Glucose, Bld: 116 mg/dL — ABNORMAL HIGH (ref 65–99)
Potassium: 3.7 mmol/L (ref 3.5–5.3)
Sodium: 136 mmol/L (ref 135–146)
Total Bilirubin: 0.5 mg/dL (ref 0.2–1.2)
Total Protein: 6 g/dL — ABNORMAL LOW (ref 6.1–8.1)

## 2019-06-25 LAB — CBC WITH DIFFERENTIAL/PLATELET
Absolute Monocytes: 369 cells/uL (ref 200–950)
Basophils Absolute: 27 cells/uL (ref 0–200)
Basophils Relative: 0.4 %
Eosinophils Absolute: 322 cells/uL (ref 15–500)
Eosinophils Relative: 4.8 %
HCT: 33.8 % — ABNORMAL LOW (ref 35.0–45.0)
Hemoglobin: 11 g/dL — ABNORMAL LOW (ref 11.7–15.5)
Lymphs Abs: 1648 cells/uL (ref 850–3900)
MCH: 30.6 pg (ref 27.0–33.0)
MCHC: 32.5 g/dL (ref 32.0–36.0)
MCV: 94.2 fL (ref 80.0–100.0)
MPV: 10.2 fL (ref 7.5–12.5)
Monocytes Relative: 5.5 %
Neutro Abs: 4335 cells/uL (ref 1500–7800)
Neutrophils Relative %: 64.7 %
Platelets: 277 10*3/uL (ref 140–400)
RBC: 3.59 10*6/uL — ABNORMAL LOW (ref 3.80–5.10)
RDW: 14.6 % (ref 11.0–15.0)
Total Lymphocyte: 24.6 %
WBC: 6.7 10*3/uL (ref 3.8–10.8)

## 2019-06-25 LAB — LIPID PANEL
Cholesterol: 231 mg/dL — ABNORMAL HIGH (ref <200)
HDL: 43 mg/dL — ABNORMAL LOW (ref >=50)
LDL Cholesterol (Calc): 161 mg/dL (calc) — ABNORMAL HIGH
Non-HDL Cholesterol (Calc): 188 mg/dL (calc) — ABNORMAL HIGH (ref <130)
Total CHOL/HDL Ratio: 5.4 (calc) — ABNORMAL HIGH (ref <5.0)
Triglycerides: 142 mg/dL (ref <150)

## 2019-06-25 LAB — TSH: TSH: 2.84 mIU/L (ref 0.40–4.50)

## 2019-06-25 LAB — MAGNESIUM: Magnesium: 1.5 mg/dL (ref 1.5–2.5)

## 2019-06-25 MED ORDER — ROSUVASTATIN CALCIUM 5 MG PO TABS: 5.0000 mg | ORAL_TABLET | Freq: Every day | ORAL | 1 refills | Status: DC

## 2019-06-26 DIAGNOSIS — Z79899 Other long term (current) drug therapy: Secondary | ICD-10-CM | POA: Diagnosis not present

## 2019-06-26 DIAGNOSIS — D509 Iron deficiency anemia, unspecified: Secondary | ICD-10-CM | POA: Diagnosis not present

## 2019-06-26 DIAGNOSIS — E440 Moderate protein-calorie malnutrition: Secondary | ICD-10-CM | POA: Diagnosis not present

## 2019-06-26 DIAGNOSIS — R82998 Other abnormal findings in urine: Secondary | ICD-10-CM | POA: Diagnosis not present

## 2019-06-26 DIAGNOSIS — N186 End stage renal disease: Secondary | ICD-10-CM | POA: Diagnosis not present

## 2019-06-26 DIAGNOSIS — R17 Unspecified jaundice: Secondary | ICD-10-CM | POA: Diagnosis not present

## 2019-06-26 DIAGNOSIS — N2581 Secondary hyperparathyroidism of renal origin: Secondary | ICD-10-CM | POA: Diagnosis not present

## 2019-06-26 DIAGNOSIS — Z992 Dependence on renal dialysis: Secondary | ICD-10-CM | POA: Diagnosis not present

## 2019-06-26 DIAGNOSIS — D631 Anemia in chronic kidney disease: Secondary | ICD-10-CM | POA: Diagnosis not present

## 2019-06-26 DIAGNOSIS — Z23 Encounter for immunization: Secondary | ICD-10-CM | POA: Diagnosis not present

## 2019-06-27 DIAGNOSIS — Z992 Dependence on renal dialysis: Secondary | ICD-10-CM | POA: Diagnosis not present

## 2019-06-27 DIAGNOSIS — R82998 Other abnormal findings in urine: Secondary | ICD-10-CM | POA: Diagnosis not present

## 2019-06-27 DIAGNOSIS — Z79899 Other long term (current) drug therapy: Secondary | ICD-10-CM | POA: Diagnosis not present

## 2019-06-27 DIAGNOSIS — Z23 Encounter for immunization: Secondary | ICD-10-CM | POA: Diagnosis not present

## 2019-06-27 DIAGNOSIS — N186 End stage renal disease: Secondary | ICD-10-CM | POA: Diagnosis not present

## 2019-06-27 DIAGNOSIS — D509 Iron deficiency anemia, unspecified: Secondary | ICD-10-CM | POA: Diagnosis not present

## 2019-06-27 DIAGNOSIS — E440 Moderate protein-calorie malnutrition: Secondary | ICD-10-CM | POA: Diagnosis not present

## 2019-06-27 DIAGNOSIS — D131 Benign neoplasm of stomach: Secondary | ICD-10-CM | POA: Diagnosis not present

## 2019-06-27 DIAGNOSIS — N2581 Secondary hyperparathyroidism of renal origin: Secondary | ICD-10-CM | POA: Diagnosis not present

## 2019-06-27 DIAGNOSIS — D631 Anemia in chronic kidney disease: Secondary | ICD-10-CM | POA: Diagnosis not present

## 2019-06-27 DIAGNOSIS — R17 Unspecified jaundice: Secondary | ICD-10-CM | POA: Diagnosis not present

## 2019-06-28 DIAGNOSIS — N186 End stage renal disease: Secondary | ICD-10-CM | POA: Diagnosis not present

## 2019-06-28 DIAGNOSIS — Z79899 Other long term (current) drug therapy: Secondary | ICD-10-CM | POA: Diagnosis not present

## 2019-06-28 DIAGNOSIS — R17 Unspecified jaundice: Secondary | ICD-10-CM | POA: Diagnosis not present

## 2019-06-28 DIAGNOSIS — E440 Moderate protein-calorie malnutrition: Secondary | ICD-10-CM | POA: Diagnosis not present

## 2019-06-28 DIAGNOSIS — Z23 Encounter for immunization: Secondary | ICD-10-CM | POA: Diagnosis not present

## 2019-06-28 DIAGNOSIS — D509 Iron deficiency anemia, unspecified: Secondary | ICD-10-CM | POA: Diagnosis not present

## 2019-06-28 DIAGNOSIS — N2581 Secondary hyperparathyroidism of renal origin: Secondary | ICD-10-CM | POA: Diagnosis not present

## 2019-06-28 DIAGNOSIS — Z992 Dependence on renal dialysis: Secondary | ICD-10-CM | POA: Diagnosis not present

## 2019-06-28 DIAGNOSIS — R82998 Other abnormal findings in urine: Secondary | ICD-10-CM | POA: Diagnosis not present

## 2019-06-28 DIAGNOSIS — D631 Anemia in chronic kidney disease: Secondary | ICD-10-CM | POA: Diagnosis not present

## 2019-06-29 DIAGNOSIS — Z992 Dependence on renal dialysis: Secondary | ICD-10-CM | POA: Diagnosis not present

## 2019-06-29 DIAGNOSIS — D509 Iron deficiency anemia, unspecified: Secondary | ICD-10-CM | POA: Diagnosis not present

## 2019-06-29 DIAGNOSIS — E440 Moderate protein-calorie malnutrition: Secondary | ICD-10-CM | POA: Diagnosis not present

## 2019-06-29 DIAGNOSIS — Z23 Encounter for immunization: Secondary | ICD-10-CM | POA: Diagnosis not present

## 2019-06-29 DIAGNOSIS — Z79899 Other long term (current) drug therapy: Secondary | ICD-10-CM | POA: Diagnosis not present

## 2019-06-29 DIAGNOSIS — D631 Anemia in chronic kidney disease: Secondary | ICD-10-CM | POA: Diagnosis not present

## 2019-06-29 DIAGNOSIS — R82998 Other abnormal findings in urine: Secondary | ICD-10-CM | POA: Diagnosis not present

## 2019-06-29 DIAGNOSIS — N186 End stage renal disease: Secondary | ICD-10-CM | POA: Diagnosis not present

## 2019-06-29 DIAGNOSIS — R17 Unspecified jaundice: Secondary | ICD-10-CM | POA: Diagnosis not present

## 2019-06-29 DIAGNOSIS — N2581 Secondary hyperparathyroidism of renal origin: Secondary | ICD-10-CM | POA: Diagnosis not present

## 2019-06-30 DIAGNOSIS — D631 Anemia in chronic kidney disease: Secondary | ICD-10-CM | POA: Diagnosis not present

## 2019-06-30 DIAGNOSIS — N186 End stage renal disease: Secondary | ICD-10-CM | POA: Diagnosis not present

## 2019-06-30 DIAGNOSIS — N2581 Secondary hyperparathyroidism of renal origin: Secondary | ICD-10-CM | POA: Diagnosis not present

## 2019-06-30 DIAGNOSIS — Z23 Encounter for immunization: Secondary | ICD-10-CM | POA: Diagnosis not present

## 2019-06-30 DIAGNOSIS — R17 Unspecified jaundice: Secondary | ICD-10-CM | POA: Diagnosis not present

## 2019-06-30 DIAGNOSIS — R82998 Other abnormal findings in urine: Secondary | ICD-10-CM | POA: Diagnosis not present

## 2019-06-30 DIAGNOSIS — D509 Iron deficiency anemia, unspecified: Secondary | ICD-10-CM | POA: Diagnosis not present

## 2019-06-30 DIAGNOSIS — Z992 Dependence on renal dialysis: Secondary | ICD-10-CM | POA: Diagnosis not present

## 2019-06-30 DIAGNOSIS — E440 Moderate protein-calorie malnutrition: Secondary | ICD-10-CM | POA: Diagnosis not present

## 2019-06-30 DIAGNOSIS — Z79899 Other long term (current) drug therapy: Secondary | ICD-10-CM | POA: Diagnosis not present

## 2019-07-01 DIAGNOSIS — Z992 Dependence on renal dialysis: Secondary | ICD-10-CM | POA: Diagnosis not present

## 2019-07-01 DIAGNOSIS — D631 Anemia in chronic kidney disease: Secondary | ICD-10-CM | POA: Diagnosis not present

## 2019-07-01 DIAGNOSIS — Z23 Encounter for immunization: Secondary | ICD-10-CM | POA: Diagnosis not present

## 2019-07-01 DIAGNOSIS — N186 End stage renal disease: Secondary | ICD-10-CM | POA: Diagnosis not present

## 2019-07-01 DIAGNOSIS — D509 Iron deficiency anemia, unspecified: Secondary | ICD-10-CM | POA: Diagnosis not present

## 2019-07-01 DIAGNOSIS — R82998 Other abnormal findings in urine: Secondary | ICD-10-CM | POA: Diagnosis not present

## 2019-07-01 DIAGNOSIS — N2581 Secondary hyperparathyroidism of renal origin: Secondary | ICD-10-CM | POA: Diagnosis not present

## 2019-07-01 DIAGNOSIS — Z79899 Other long term (current) drug therapy: Secondary | ICD-10-CM | POA: Diagnosis not present

## 2019-07-01 DIAGNOSIS — R17 Unspecified jaundice: Secondary | ICD-10-CM | POA: Diagnosis not present

## 2019-07-01 DIAGNOSIS — E440 Moderate protein-calorie malnutrition: Secondary | ICD-10-CM | POA: Diagnosis not present

## 2019-07-02 DIAGNOSIS — Z992 Dependence on renal dialysis: Secondary | ICD-10-CM | POA: Diagnosis not present

## 2019-07-02 DIAGNOSIS — R17 Unspecified jaundice: Secondary | ICD-10-CM | POA: Diagnosis not present

## 2019-07-02 DIAGNOSIS — Z23 Encounter for immunization: Secondary | ICD-10-CM | POA: Diagnosis not present

## 2019-07-02 DIAGNOSIS — E440 Moderate protein-calorie malnutrition: Secondary | ICD-10-CM | POA: Diagnosis not present

## 2019-07-02 DIAGNOSIS — Z79899 Other long term (current) drug therapy: Secondary | ICD-10-CM | POA: Diagnosis not present

## 2019-07-02 DIAGNOSIS — R82998 Other abnormal findings in urine: Secondary | ICD-10-CM | POA: Diagnosis not present

## 2019-07-02 DIAGNOSIS — N2581 Secondary hyperparathyroidism of renal origin: Secondary | ICD-10-CM | POA: Diagnosis not present

## 2019-07-02 DIAGNOSIS — N186 End stage renal disease: Secondary | ICD-10-CM | POA: Diagnosis not present

## 2019-07-02 DIAGNOSIS — D631 Anemia in chronic kidney disease: Secondary | ICD-10-CM | POA: Diagnosis not present

## 2019-07-02 DIAGNOSIS — D509 Iron deficiency anemia, unspecified: Secondary | ICD-10-CM | POA: Diagnosis not present

## 2019-07-03 DIAGNOSIS — D631 Anemia in chronic kidney disease: Secondary | ICD-10-CM | POA: Diagnosis not present

## 2019-07-03 DIAGNOSIS — R17 Unspecified jaundice: Secondary | ICD-10-CM | POA: Diagnosis not present

## 2019-07-03 DIAGNOSIS — N2581 Secondary hyperparathyroidism of renal origin: Secondary | ICD-10-CM | POA: Diagnosis not present

## 2019-07-03 DIAGNOSIS — E440 Moderate protein-calorie malnutrition: Secondary | ICD-10-CM | POA: Diagnosis not present

## 2019-07-03 DIAGNOSIS — D509 Iron deficiency anemia, unspecified: Secondary | ICD-10-CM | POA: Diagnosis not present

## 2019-07-03 DIAGNOSIS — N186 End stage renal disease: Secondary | ICD-10-CM | POA: Diagnosis not present

## 2019-07-03 DIAGNOSIS — Z992 Dependence on renal dialysis: Secondary | ICD-10-CM | POA: Diagnosis not present

## 2019-07-03 DIAGNOSIS — Z79899 Other long term (current) drug therapy: Secondary | ICD-10-CM | POA: Diagnosis not present

## 2019-07-03 DIAGNOSIS — Z23 Encounter for immunization: Secondary | ICD-10-CM | POA: Diagnosis not present

## 2019-07-03 DIAGNOSIS — R82998 Other abnormal findings in urine: Secondary | ICD-10-CM | POA: Diagnosis not present

## 2019-07-04 DIAGNOSIS — D509 Iron deficiency anemia, unspecified: Secondary | ICD-10-CM | POA: Diagnosis not present

## 2019-07-04 DIAGNOSIS — D631 Anemia in chronic kidney disease: Secondary | ICD-10-CM | POA: Diagnosis not present

## 2019-07-04 DIAGNOSIS — Z79899 Other long term (current) drug therapy: Secondary | ICD-10-CM | POA: Diagnosis not present

## 2019-07-04 DIAGNOSIS — Z23 Encounter for immunization: Secondary | ICD-10-CM | POA: Diagnosis not present

## 2019-07-04 DIAGNOSIS — Z992 Dependence on renal dialysis: Secondary | ICD-10-CM | POA: Diagnosis not present

## 2019-07-04 DIAGNOSIS — N2581 Secondary hyperparathyroidism of renal origin: Secondary | ICD-10-CM | POA: Diagnosis not present

## 2019-07-04 DIAGNOSIS — E440 Moderate protein-calorie malnutrition: Secondary | ICD-10-CM | POA: Diagnosis not present

## 2019-07-04 DIAGNOSIS — R17 Unspecified jaundice: Secondary | ICD-10-CM | POA: Diagnosis not present

## 2019-07-04 DIAGNOSIS — R82998 Other abnormal findings in urine: Secondary | ICD-10-CM | POA: Diagnosis not present

## 2019-07-04 DIAGNOSIS — N186 End stage renal disease: Secondary | ICD-10-CM | POA: Diagnosis not present

## 2019-07-05 DIAGNOSIS — R82998 Other abnormal findings in urine: Secondary | ICD-10-CM | POA: Diagnosis not present

## 2019-07-05 DIAGNOSIS — E440 Moderate protein-calorie malnutrition: Secondary | ICD-10-CM | POA: Diagnosis not present

## 2019-07-05 DIAGNOSIS — D631 Anemia in chronic kidney disease: Secondary | ICD-10-CM | POA: Diagnosis not present

## 2019-07-05 DIAGNOSIS — D509 Iron deficiency anemia, unspecified: Secondary | ICD-10-CM | POA: Diagnosis not present

## 2019-07-05 DIAGNOSIS — N186 End stage renal disease: Secondary | ICD-10-CM | POA: Diagnosis not present

## 2019-07-05 DIAGNOSIS — Z23 Encounter for immunization: Secondary | ICD-10-CM | POA: Diagnosis not present

## 2019-07-05 DIAGNOSIS — Z79899 Other long term (current) drug therapy: Secondary | ICD-10-CM | POA: Diagnosis not present

## 2019-07-05 DIAGNOSIS — Z992 Dependence on renal dialysis: Secondary | ICD-10-CM | POA: Diagnosis not present

## 2019-07-05 DIAGNOSIS — N2581 Secondary hyperparathyroidism of renal origin: Secondary | ICD-10-CM | POA: Diagnosis not present

## 2019-07-05 DIAGNOSIS — R17 Unspecified jaundice: Secondary | ICD-10-CM | POA: Diagnosis not present

## 2019-07-06 DIAGNOSIS — D509 Iron deficiency anemia, unspecified: Secondary | ICD-10-CM | POA: Diagnosis not present

## 2019-07-06 DIAGNOSIS — R82998 Other abnormal findings in urine: Secondary | ICD-10-CM | POA: Diagnosis not present

## 2019-07-06 DIAGNOSIS — Z992 Dependence on renal dialysis: Secondary | ICD-10-CM | POA: Diagnosis not present

## 2019-07-06 DIAGNOSIS — E440 Moderate protein-calorie malnutrition: Secondary | ICD-10-CM | POA: Diagnosis not present

## 2019-07-06 DIAGNOSIS — N2581 Secondary hyperparathyroidism of renal origin: Secondary | ICD-10-CM | POA: Diagnosis not present

## 2019-07-06 DIAGNOSIS — Z23 Encounter for immunization: Secondary | ICD-10-CM | POA: Diagnosis not present

## 2019-07-06 DIAGNOSIS — N186 End stage renal disease: Secondary | ICD-10-CM | POA: Diagnosis not present

## 2019-07-06 DIAGNOSIS — Z79899 Other long term (current) drug therapy: Secondary | ICD-10-CM | POA: Diagnosis not present

## 2019-07-06 DIAGNOSIS — D631 Anemia in chronic kidney disease: Secondary | ICD-10-CM | POA: Diagnosis not present

## 2019-07-06 DIAGNOSIS — R17 Unspecified jaundice: Secondary | ICD-10-CM | POA: Diagnosis not present

## 2019-07-07 DIAGNOSIS — R17 Unspecified jaundice: Secondary | ICD-10-CM | POA: Diagnosis not present

## 2019-07-07 DIAGNOSIS — D631 Anemia in chronic kidney disease: Secondary | ICD-10-CM | POA: Diagnosis not present

## 2019-07-07 DIAGNOSIS — D509 Iron deficiency anemia, unspecified: Secondary | ICD-10-CM | POA: Diagnosis not present

## 2019-07-07 DIAGNOSIS — E440 Moderate protein-calorie malnutrition: Secondary | ICD-10-CM | POA: Diagnosis not present

## 2019-07-07 DIAGNOSIS — N2581 Secondary hyperparathyroidism of renal origin: Secondary | ICD-10-CM | POA: Diagnosis not present

## 2019-07-07 DIAGNOSIS — R82998 Other abnormal findings in urine: Secondary | ICD-10-CM | POA: Diagnosis not present

## 2019-07-07 DIAGNOSIS — Z79899 Other long term (current) drug therapy: Secondary | ICD-10-CM | POA: Diagnosis not present

## 2019-07-07 DIAGNOSIS — Z23 Encounter for immunization: Secondary | ICD-10-CM | POA: Diagnosis not present

## 2019-07-07 DIAGNOSIS — N186 End stage renal disease: Secondary | ICD-10-CM | POA: Diagnosis not present

## 2019-07-07 DIAGNOSIS — Z992 Dependence on renal dialysis: Secondary | ICD-10-CM | POA: Diagnosis not present

## 2019-07-08 DIAGNOSIS — Z23 Encounter for immunization: Secondary | ICD-10-CM | POA: Diagnosis not present

## 2019-07-08 DIAGNOSIS — N2581 Secondary hyperparathyroidism of renal origin: Secondary | ICD-10-CM | POA: Diagnosis not present

## 2019-07-08 DIAGNOSIS — N186 End stage renal disease: Secondary | ICD-10-CM | POA: Diagnosis not present

## 2019-07-08 DIAGNOSIS — Z79899 Other long term (current) drug therapy: Secondary | ICD-10-CM | POA: Diagnosis not present

## 2019-07-08 DIAGNOSIS — D631 Anemia in chronic kidney disease: Secondary | ICD-10-CM | POA: Diagnosis not present

## 2019-07-08 DIAGNOSIS — D509 Iron deficiency anemia, unspecified: Secondary | ICD-10-CM | POA: Diagnosis not present

## 2019-07-08 DIAGNOSIS — E440 Moderate protein-calorie malnutrition: Secondary | ICD-10-CM | POA: Diagnosis not present

## 2019-07-08 DIAGNOSIS — Z992 Dependence on renal dialysis: Secondary | ICD-10-CM | POA: Diagnosis not present

## 2019-07-08 DIAGNOSIS — R17 Unspecified jaundice: Secondary | ICD-10-CM | POA: Diagnosis not present

## 2019-07-08 DIAGNOSIS — R82998 Other abnormal findings in urine: Secondary | ICD-10-CM | POA: Diagnosis not present

## 2019-07-09 ENCOUNTER — Other Ambulatory Visit: Payer: Self-pay

## 2019-07-09 DIAGNOSIS — G5603 Carpal tunnel syndrome, bilateral upper limbs: Secondary | ICD-10-CM | POA: Diagnosis not present

## 2019-07-09 DIAGNOSIS — D509 Iron deficiency anemia, unspecified: Secondary | ICD-10-CM | POA: Diagnosis not present

## 2019-07-09 DIAGNOSIS — Z79899 Other long term (current) drug therapy: Secondary | ICD-10-CM | POA: Diagnosis not present

## 2019-07-09 DIAGNOSIS — Z992 Dependence on renal dialysis: Secondary | ICD-10-CM | POA: Diagnosis not present

## 2019-07-09 DIAGNOSIS — D631 Anemia in chronic kidney disease: Secondary | ICD-10-CM | POA: Diagnosis not present

## 2019-07-09 DIAGNOSIS — Z23 Encounter for immunization: Secondary | ICD-10-CM | POA: Diagnosis not present

## 2019-07-09 DIAGNOSIS — M47812 Spondylosis without myelopathy or radiculopathy, cervical region: Secondary | ICD-10-CM | POA: Diagnosis not present

## 2019-07-09 DIAGNOSIS — R82998 Other abnormal findings in urine: Secondary | ICD-10-CM | POA: Diagnosis not present

## 2019-07-09 DIAGNOSIS — E440 Moderate protein-calorie malnutrition: Secondary | ICD-10-CM | POA: Diagnosis not present

## 2019-07-09 DIAGNOSIS — N2581 Secondary hyperparathyroidism of renal origin: Secondary | ICD-10-CM | POA: Diagnosis not present

## 2019-07-09 DIAGNOSIS — R17 Unspecified jaundice: Secondary | ICD-10-CM | POA: Diagnosis not present

## 2019-07-09 DIAGNOSIS — N186 End stage renal disease: Secondary | ICD-10-CM | POA: Diagnosis not present

## 2019-07-10 DIAGNOSIS — Z23 Encounter for immunization: Secondary | ICD-10-CM | POA: Diagnosis not present

## 2019-07-10 DIAGNOSIS — D509 Iron deficiency anemia, unspecified: Secondary | ICD-10-CM | POA: Diagnosis not present

## 2019-07-10 DIAGNOSIS — R82998 Other abnormal findings in urine: Secondary | ICD-10-CM | POA: Diagnosis not present

## 2019-07-10 DIAGNOSIS — Z79899 Other long term (current) drug therapy: Secondary | ICD-10-CM | POA: Diagnosis not present

## 2019-07-10 DIAGNOSIS — D631 Anemia in chronic kidney disease: Secondary | ICD-10-CM | POA: Diagnosis not present

## 2019-07-10 DIAGNOSIS — E440 Moderate protein-calorie malnutrition: Secondary | ICD-10-CM | POA: Diagnosis not present

## 2019-07-10 DIAGNOSIS — R17 Unspecified jaundice: Secondary | ICD-10-CM | POA: Diagnosis not present

## 2019-07-10 DIAGNOSIS — N2581 Secondary hyperparathyroidism of renal origin: Secondary | ICD-10-CM | POA: Diagnosis not present

## 2019-07-10 DIAGNOSIS — Z992 Dependence on renal dialysis: Secondary | ICD-10-CM | POA: Diagnosis not present

## 2019-07-10 DIAGNOSIS — N186 End stage renal disease: Secondary | ICD-10-CM | POA: Diagnosis not present

## 2019-07-11 DIAGNOSIS — D631 Anemia in chronic kidney disease: Secondary | ICD-10-CM | POA: Diagnosis not present

## 2019-07-11 DIAGNOSIS — R17 Unspecified jaundice: Secondary | ICD-10-CM | POA: Diagnosis not present

## 2019-07-11 DIAGNOSIS — N2581 Secondary hyperparathyroidism of renal origin: Secondary | ICD-10-CM | POA: Diagnosis not present

## 2019-07-11 DIAGNOSIS — Z992 Dependence on renal dialysis: Secondary | ICD-10-CM | POA: Diagnosis not present

## 2019-07-11 DIAGNOSIS — Z23 Encounter for immunization: Secondary | ICD-10-CM | POA: Diagnosis not present

## 2019-07-11 DIAGNOSIS — N186 End stage renal disease: Secondary | ICD-10-CM | POA: Diagnosis not present

## 2019-07-11 DIAGNOSIS — E440 Moderate protein-calorie malnutrition: Secondary | ICD-10-CM | POA: Diagnosis not present

## 2019-07-11 DIAGNOSIS — Z79899 Other long term (current) drug therapy: Secondary | ICD-10-CM | POA: Diagnosis not present

## 2019-07-11 DIAGNOSIS — R82998 Other abnormal findings in urine: Secondary | ICD-10-CM | POA: Diagnosis not present

## 2019-07-11 DIAGNOSIS — D509 Iron deficiency anemia, unspecified: Secondary | ICD-10-CM | POA: Diagnosis not present

## 2019-07-12 DIAGNOSIS — E440 Moderate protein-calorie malnutrition: Secondary | ICD-10-CM | POA: Diagnosis not present

## 2019-07-12 DIAGNOSIS — N186 End stage renal disease: Secondary | ICD-10-CM | POA: Diagnosis not present

## 2019-07-12 DIAGNOSIS — Z992 Dependence on renal dialysis: Secondary | ICD-10-CM | POA: Diagnosis not present

## 2019-07-12 DIAGNOSIS — Z79899 Other long term (current) drug therapy: Secondary | ICD-10-CM | POA: Diagnosis not present

## 2019-07-12 DIAGNOSIS — D509 Iron deficiency anemia, unspecified: Secondary | ICD-10-CM | POA: Diagnosis not present

## 2019-07-12 DIAGNOSIS — R82998 Other abnormal findings in urine: Secondary | ICD-10-CM | POA: Diagnosis not present

## 2019-07-12 DIAGNOSIS — N2581 Secondary hyperparathyroidism of renal origin: Secondary | ICD-10-CM | POA: Diagnosis not present

## 2019-07-12 DIAGNOSIS — R17 Unspecified jaundice: Secondary | ICD-10-CM | POA: Diagnosis not present

## 2019-07-12 DIAGNOSIS — D631 Anemia in chronic kidney disease: Secondary | ICD-10-CM | POA: Diagnosis not present

## 2019-07-12 DIAGNOSIS — Z23 Encounter for immunization: Secondary | ICD-10-CM | POA: Diagnosis not present

## 2019-07-13 DIAGNOSIS — Z79899 Other long term (current) drug therapy: Secondary | ICD-10-CM | POA: Diagnosis not present

## 2019-07-13 DIAGNOSIS — Z23 Encounter for immunization: Secondary | ICD-10-CM | POA: Diagnosis not present

## 2019-07-13 DIAGNOSIS — Z992 Dependence on renal dialysis: Secondary | ICD-10-CM | POA: Diagnosis not present

## 2019-07-13 DIAGNOSIS — D509 Iron deficiency anemia, unspecified: Secondary | ICD-10-CM | POA: Diagnosis not present

## 2019-07-13 DIAGNOSIS — R17 Unspecified jaundice: Secondary | ICD-10-CM | POA: Diagnosis not present

## 2019-07-13 DIAGNOSIS — E440 Moderate protein-calorie malnutrition: Secondary | ICD-10-CM | POA: Diagnosis not present

## 2019-07-13 DIAGNOSIS — R82998 Other abnormal findings in urine: Secondary | ICD-10-CM | POA: Diagnosis not present

## 2019-07-13 DIAGNOSIS — D631 Anemia in chronic kidney disease: Secondary | ICD-10-CM | POA: Diagnosis not present

## 2019-07-13 DIAGNOSIS — N186 End stage renal disease: Secondary | ICD-10-CM | POA: Diagnosis not present

## 2019-07-13 DIAGNOSIS — N2581 Secondary hyperparathyroidism of renal origin: Secondary | ICD-10-CM | POA: Diagnosis not present

## 2019-07-14 ENCOUNTER — Other Ambulatory Visit: Payer: Self-pay | Admitting: Adult Health

## 2019-07-14 DIAGNOSIS — Z23 Encounter for immunization: Secondary | ICD-10-CM | POA: Diagnosis not present

## 2019-07-14 DIAGNOSIS — R82998 Other abnormal findings in urine: Secondary | ICD-10-CM | POA: Diagnosis not present

## 2019-07-14 DIAGNOSIS — N2581 Secondary hyperparathyroidism of renal origin: Secondary | ICD-10-CM | POA: Diagnosis not present

## 2019-07-14 DIAGNOSIS — D631 Anemia in chronic kidney disease: Secondary | ICD-10-CM | POA: Diagnosis not present

## 2019-07-14 DIAGNOSIS — Z79899 Other long term (current) drug therapy: Secondary | ICD-10-CM | POA: Diagnosis not present

## 2019-07-14 DIAGNOSIS — D509 Iron deficiency anemia, unspecified: Secondary | ICD-10-CM | POA: Diagnosis not present

## 2019-07-14 DIAGNOSIS — Z992 Dependence on renal dialysis: Secondary | ICD-10-CM | POA: Diagnosis not present

## 2019-07-14 DIAGNOSIS — N186 End stage renal disease: Secondary | ICD-10-CM | POA: Diagnosis not present

## 2019-07-14 DIAGNOSIS — E440 Moderate protein-calorie malnutrition: Secondary | ICD-10-CM | POA: Diagnosis not present

## 2019-07-14 DIAGNOSIS — R17 Unspecified jaundice: Secondary | ICD-10-CM | POA: Diagnosis not present

## 2019-07-15 DIAGNOSIS — Z992 Dependence on renal dialysis: Secondary | ICD-10-CM | POA: Diagnosis not present

## 2019-07-15 DIAGNOSIS — Z23 Encounter for immunization: Secondary | ICD-10-CM | POA: Diagnosis not present

## 2019-07-15 DIAGNOSIS — D509 Iron deficiency anemia, unspecified: Secondary | ICD-10-CM | POA: Diagnosis not present

## 2019-07-15 DIAGNOSIS — R82998 Other abnormal findings in urine: Secondary | ICD-10-CM | POA: Diagnosis not present

## 2019-07-15 DIAGNOSIS — E440 Moderate protein-calorie malnutrition: Secondary | ICD-10-CM | POA: Diagnosis not present

## 2019-07-15 DIAGNOSIS — N186 End stage renal disease: Secondary | ICD-10-CM | POA: Diagnosis not present

## 2019-07-15 DIAGNOSIS — R17 Unspecified jaundice: Secondary | ICD-10-CM | POA: Diagnosis not present

## 2019-07-15 DIAGNOSIS — Z79899 Other long term (current) drug therapy: Secondary | ICD-10-CM | POA: Diagnosis not present

## 2019-07-15 DIAGNOSIS — D631 Anemia in chronic kidney disease: Secondary | ICD-10-CM | POA: Diagnosis not present

## 2019-07-15 DIAGNOSIS — N2581 Secondary hyperparathyroidism of renal origin: Secondary | ICD-10-CM | POA: Diagnosis not present

## 2019-07-16 DIAGNOSIS — Z79899 Other long term (current) drug therapy: Secondary | ICD-10-CM | POA: Diagnosis not present

## 2019-07-16 DIAGNOSIS — R17 Unspecified jaundice: Secondary | ICD-10-CM | POA: Diagnosis not present

## 2019-07-16 DIAGNOSIS — D631 Anemia in chronic kidney disease: Secondary | ICD-10-CM | POA: Diagnosis not present

## 2019-07-16 DIAGNOSIS — Z23 Encounter for immunization: Secondary | ICD-10-CM | POA: Diagnosis not present

## 2019-07-16 DIAGNOSIS — R82998 Other abnormal findings in urine: Secondary | ICD-10-CM | POA: Diagnosis not present

## 2019-07-16 DIAGNOSIS — Z992 Dependence on renal dialysis: Secondary | ICD-10-CM | POA: Diagnosis not present

## 2019-07-16 DIAGNOSIS — E440 Moderate protein-calorie malnutrition: Secondary | ICD-10-CM | POA: Diagnosis not present

## 2019-07-16 DIAGNOSIS — N2581 Secondary hyperparathyroidism of renal origin: Secondary | ICD-10-CM | POA: Diagnosis not present

## 2019-07-16 DIAGNOSIS — N186 End stage renal disease: Secondary | ICD-10-CM | POA: Diagnosis not present

## 2019-07-16 DIAGNOSIS — D509 Iron deficiency anemia, unspecified: Secondary | ICD-10-CM | POA: Diagnosis not present

## 2019-07-17 DIAGNOSIS — N186 End stage renal disease: Secondary | ICD-10-CM | POA: Diagnosis not present

## 2019-07-17 DIAGNOSIS — N2581 Secondary hyperparathyroidism of renal origin: Secondary | ICD-10-CM | POA: Diagnosis not present

## 2019-07-17 DIAGNOSIS — D509 Iron deficiency anemia, unspecified: Secondary | ICD-10-CM | POA: Diagnosis not present

## 2019-07-17 DIAGNOSIS — R82998 Other abnormal findings in urine: Secondary | ICD-10-CM | POA: Diagnosis not present

## 2019-07-17 DIAGNOSIS — Z23 Encounter for immunization: Secondary | ICD-10-CM | POA: Diagnosis not present

## 2019-07-17 DIAGNOSIS — E440 Moderate protein-calorie malnutrition: Secondary | ICD-10-CM | POA: Diagnosis not present

## 2019-07-17 DIAGNOSIS — D631 Anemia in chronic kidney disease: Secondary | ICD-10-CM | POA: Diagnosis not present

## 2019-07-17 DIAGNOSIS — Z79899 Other long term (current) drug therapy: Secondary | ICD-10-CM | POA: Diagnosis not present

## 2019-07-17 DIAGNOSIS — R17 Unspecified jaundice: Secondary | ICD-10-CM | POA: Diagnosis not present

## 2019-07-17 DIAGNOSIS — Z992 Dependence on renal dialysis: Secondary | ICD-10-CM | POA: Diagnosis not present

## 2019-07-18 DIAGNOSIS — R82998 Other abnormal findings in urine: Secondary | ICD-10-CM | POA: Diagnosis not present

## 2019-07-18 DIAGNOSIS — E440 Moderate protein-calorie malnutrition: Secondary | ICD-10-CM | POA: Diagnosis not present

## 2019-07-18 DIAGNOSIS — Z79899 Other long term (current) drug therapy: Secondary | ICD-10-CM | POA: Diagnosis not present

## 2019-07-18 DIAGNOSIS — Z992 Dependence on renal dialysis: Secondary | ICD-10-CM | POA: Diagnosis not present

## 2019-07-18 DIAGNOSIS — D631 Anemia in chronic kidney disease: Secondary | ICD-10-CM | POA: Diagnosis not present

## 2019-07-18 DIAGNOSIS — N2581 Secondary hyperparathyroidism of renal origin: Secondary | ICD-10-CM | POA: Diagnosis not present

## 2019-07-18 DIAGNOSIS — D509 Iron deficiency anemia, unspecified: Secondary | ICD-10-CM | POA: Diagnosis not present

## 2019-07-18 DIAGNOSIS — N186 End stage renal disease: Secondary | ICD-10-CM | POA: Diagnosis not present

## 2019-07-18 DIAGNOSIS — R17 Unspecified jaundice: Secondary | ICD-10-CM | POA: Diagnosis not present

## 2019-07-18 DIAGNOSIS — Z23 Encounter for immunization: Secondary | ICD-10-CM | POA: Diagnosis not present

## 2019-07-19 DIAGNOSIS — N2581 Secondary hyperparathyroidism of renal origin: Secondary | ICD-10-CM | POA: Diagnosis not present

## 2019-07-19 DIAGNOSIS — Z23 Encounter for immunization: Secondary | ICD-10-CM | POA: Diagnosis not present

## 2019-07-19 DIAGNOSIS — R82998 Other abnormal findings in urine: Secondary | ICD-10-CM | POA: Diagnosis not present

## 2019-07-19 DIAGNOSIS — N186 End stage renal disease: Secondary | ICD-10-CM | POA: Diagnosis not present

## 2019-07-19 DIAGNOSIS — Z79899 Other long term (current) drug therapy: Secondary | ICD-10-CM | POA: Diagnosis not present

## 2019-07-19 DIAGNOSIS — Z992 Dependence on renal dialysis: Secondary | ICD-10-CM | POA: Diagnosis not present

## 2019-07-19 DIAGNOSIS — D509 Iron deficiency anemia, unspecified: Secondary | ICD-10-CM | POA: Diagnosis not present

## 2019-07-19 DIAGNOSIS — R17 Unspecified jaundice: Secondary | ICD-10-CM | POA: Diagnosis not present

## 2019-07-19 DIAGNOSIS — E440 Moderate protein-calorie malnutrition: Secondary | ICD-10-CM | POA: Diagnosis not present

## 2019-07-19 DIAGNOSIS — D631 Anemia in chronic kidney disease: Secondary | ICD-10-CM | POA: Diagnosis not present

## 2019-07-20 DIAGNOSIS — Z23 Encounter for immunization: Secondary | ICD-10-CM | POA: Diagnosis not present

## 2019-07-20 DIAGNOSIS — N186 End stage renal disease: Secondary | ICD-10-CM | POA: Diagnosis not present

## 2019-07-20 DIAGNOSIS — D509 Iron deficiency anemia, unspecified: Secondary | ICD-10-CM | POA: Diagnosis not present

## 2019-07-20 DIAGNOSIS — E440 Moderate protein-calorie malnutrition: Secondary | ICD-10-CM | POA: Diagnosis not present

## 2019-07-20 DIAGNOSIS — R82998 Other abnormal findings in urine: Secondary | ICD-10-CM | POA: Diagnosis not present

## 2019-07-20 DIAGNOSIS — R17 Unspecified jaundice: Secondary | ICD-10-CM | POA: Diagnosis not present

## 2019-07-20 DIAGNOSIS — Z992 Dependence on renal dialysis: Secondary | ICD-10-CM | POA: Diagnosis not present

## 2019-07-20 DIAGNOSIS — N2581 Secondary hyperparathyroidism of renal origin: Secondary | ICD-10-CM | POA: Diagnosis not present

## 2019-07-20 DIAGNOSIS — Z79899 Other long term (current) drug therapy: Secondary | ICD-10-CM | POA: Diagnosis not present

## 2019-07-20 DIAGNOSIS — D631 Anemia in chronic kidney disease: Secondary | ICD-10-CM | POA: Diagnosis not present

## 2019-07-21 DIAGNOSIS — N2581 Secondary hyperparathyroidism of renal origin: Secondary | ICD-10-CM | POA: Diagnosis not present

## 2019-07-21 DIAGNOSIS — R82998 Other abnormal findings in urine: Secondary | ICD-10-CM | POA: Diagnosis not present

## 2019-07-21 DIAGNOSIS — Z23 Encounter for immunization: Secondary | ICD-10-CM | POA: Diagnosis not present

## 2019-07-21 DIAGNOSIS — E440 Moderate protein-calorie malnutrition: Secondary | ICD-10-CM | POA: Diagnosis not present

## 2019-07-21 DIAGNOSIS — Z79899 Other long term (current) drug therapy: Secondary | ICD-10-CM | POA: Diagnosis not present

## 2019-07-21 DIAGNOSIS — D509 Iron deficiency anemia, unspecified: Secondary | ICD-10-CM | POA: Diagnosis not present

## 2019-07-21 DIAGNOSIS — E1122 Type 2 diabetes mellitus with diabetic chronic kidney disease: Secondary | ICD-10-CM | POA: Diagnosis not present

## 2019-07-21 DIAGNOSIS — Z992 Dependence on renal dialysis: Secondary | ICD-10-CM | POA: Diagnosis not present

## 2019-07-21 DIAGNOSIS — N186 End stage renal disease: Secondary | ICD-10-CM | POA: Diagnosis not present

## 2019-07-21 DIAGNOSIS — R17 Unspecified jaundice: Secondary | ICD-10-CM | POA: Diagnosis not present

## 2019-07-21 DIAGNOSIS — D631 Anemia in chronic kidney disease: Secondary | ICD-10-CM | POA: Diagnosis not present

## 2019-07-22 DIAGNOSIS — N186 End stage renal disease: Secondary | ICD-10-CM | POA: Diagnosis not present

## 2019-07-22 DIAGNOSIS — E876 Hypokalemia: Secondary | ICD-10-CM | POA: Diagnosis not present

## 2019-07-22 DIAGNOSIS — Z23 Encounter for immunization: Secondary | ICD-10-CM | POA: Diagnosis not present

## 2019-07-22 DIAGNOSIS — E878 Other disorders of electrolyte and fluid balance, not elsewhere classified: Secondary | ICD-10-CM | POA: Diagnosis not present

## 2019-07-22 DIAGNOSIS — D509 Iron deficiency anemia, unspecified: Secondary | ICD-10-CM | POA: Diagnosis not present

## 2019-07-22 DIAGNOSIS — R82998 Other abnormal findings in urine: Secondary | ICD-10-CM | POA: Diagnosis not present

## 2019-07-22 DIAGNOSIS — R17 Unspecified jaundice: Secondary | ICD-10-CM | POA: Diagnosis not present

## 2019-07-22 DIAGNOSIS — Z79899 Other long term (current) drug therapy: Secondary | ICD-10-CM | POA: Diagnosis not present

## 2019-07-22 DIAGNOSIS — E440 Moderate protein-calorie malnutrition: Secondary | ICD-10-CM | POA: Diagnosis not present

## 2019-07-22 DIAGNOSIS — N2581 Secondary hyperparathyroidism of renal origin: Secondary | ICD-10-CM | POA: Diagnosis not present

## 2019-07-22 DIAGNOSIS — Z992 Dependence on renal dialysis: Secondary | ICD-10-CM | POA: Diagnosis not present

## 2019-07-22 DIAGNOSIS — D631 Anemia in chronic kidney disease: Secondary | ICD-10-CM | POA: Diagnosis not present

## 2019-07-23 DIAGNOSIS — R82998 Other abnormal findings in urine: Secondary | ICD-10-CM | POA: Diagnosis not present

## 2019-07-23 DIAGNOSIS — E876 Hypokalemia: Secondary | ICD-10-CM | POA: Diagnosis not present

## 2019-07-23 DIAGNOSIS — N2581 Secondary hyperparathyroidism of renal origin: Secondary | ICD-10-CM | POA: Diagnosis not present

## 2019-07-23 DIAGNOSIS — E440 Moderate protein-calorie malnutrition: Secondary | ICD-10-CM | POA: Diagnosis not present

## 2019-07-23 DIAGNOSIS — Z79899 Other long term (current) drug therapy: Secondary | ICD-10-CM | POA: Diagnosis not present

## 2019-07-23 DIAGNOSIS — D509 Iron deficiency anemia, unspecified: Secondary | ICD-10-CM | POA: Diagnosis not present

## 2019-07-23 DIAGNOSIS — Z23 Encounter for immunization: Secondary | ICD-10-CM | POA: Diagnosis not present

## 2019-07-23 DIAGNOSIS — R17 Unspecified jaundice: Secondary | ICD-10-CM | POA: Diagnosis not present

## 2019-07-23 DIAGNOSIS — N186 End stage renal disease: Secondary | ICD-10-CM | POA: Diagnosis not present

## 2019-07-23 DIAGNOSIS — Z992 Dependence on renal dialysis: Secondary | ICD-10-CM | POA: Diagnosis not present

## 2019-07-23 DIAGNOSIS — D631 Anemia in chronic kidney disease: Secondary | ICD-10-CM | POA: Diagnosis not present

## 2019-07-23 DIAGNOSIS — E878 Other disorders of electrolyte and fluid balance, not elsewhere classified: Secondary | ICD-10-CM | POA: Diagnosis not present

## 2019-07-24 DIAGNOSIS — N186 End stage renal disease: Secondary | ICD-10-CM | POA: Diagnosis not present

## 2019-07-24 DIAGNOSIS — R82998 Other abnormal findings in urine: Secondary | ICD-10-CM | POA: Diagnosis not present

## 2019-07-24 DIAGNOSIS — D509 Iron deficiency anemia, unspecified: Secondary | ICD-10-CM | POA: Diagnosis not present

## 2019-07-24 DIAGNOSIS — E440 Moderate protein-calorie malnutrition: Secondary | ICD-10-CM | POA: Diagnosis not present

## 2019-07-24 DIAGNOSIS — D631 Anemia in chronic kidney disease: Secondary | ICD-10-CM | POA: Diagnosis not present

## 2019-07-24 DIAGNOSIS — Z79899 Other long term (current) drug therapy: Secondary | ICD-10-CM | POA: Diagnosis not present

## 2019-07-24 DIAGNOSIS — R17 Unspecified jaundice: Secondary | ICD-10-CM | POA: Diagnosis not present

## 2019-07-24 DIAGNOSIS — Z23 Encounter for immunization: Secondary | ICD-10-CM | POA: Diagnosis not present

## 2019-07-24 DIAGNOSIS — E876 Hypokalemia: Secondary | ICD-10-CM | POA: Diagnosis not present

## 2019-07-24 DIAGNOSIS — Z992 Dependence on renal dialysis: Secondary | ICD-10-CM | POA: Diagnosis not present

## 2019-07-24 DIAGNOSIS — N2581 Secondary hyperparathyroidism of renal origin: Secondary | ICD-10-CM | POA: Diagnosis not present

## 2019-07-24 DIAGNOSIS — E878 Other disorders of electrolyte and fluid balance, not elsewhere classified: Secondary | ICD-10-CM | POA: Diagnosis not present

## 2019-07-25 DIAGNOSIS — Z23 Encounter for immunization: Secondary | ICD-10-CM | POA: Diagnosis not present

## 2019-07-25 DIAGNOSIS — E878 Other disorders of electrolyte and fluid balance, not elsewhere classified: Secondary | ICD-10-CM | POA: Diagnosis not present

## 2019-07-25 DIAGNOSIS — N186 End stage renal disease: Secondary | ICD-10-CM | POA: Diagnosis not present

## 2019-07-25 DIAGNOSIS — D509 Iron deficiency anemia, unspecified: Secondary | ICD-10-CM | POA: Diagnosis not present

## 2019-07-25 DIAGNOSIS — E876 Hypokalemia: Secondary | ICD-10-CM | POA: Diagnosis not present

## 2019-07-25 DIAGNOSIS — Z992 Dependence on renal dialysis: Secondary | ICD-10-CM | POA: Diagnosis not present

## 2019-07-25 DIAGNOSIS — Z79899 Other long term (current) drug therapy: Secondary | ICD-10-CM | POA: Diagnosis not present

## 2019-07-25 DIAGNOSIS — E440 Moderate protein-calorie malnutrition: Secondary | ICD-10-CM | POA: Diagnosis not present

## 2019-07-25 DIAGNOSIS — R17 Unspecified jaundice: Secondary | ICD-10-CM | POA: Diagnosis not present

## 2019-07-25 DIAGNOSIS — R82998 Other abnormal findings in urine: Secondary | ICD-10-CM | POA: Diagnosis not present

## 2019-07-25 DIAGNOSIS — D631 Anemia in chronic kidney disease: Secondary | ICD-10-CM | POA: Diagnosis not present

## 2019-07-25 DIAGNOSIS — N2581 Secondary hyperparathyroidism of renal origin: Secondary | ICD-10-CM | POA: Diagnosis not present

## 2019-07-26 DIAGNOSIS — D509 Iron deficiency anemia, unspecified: Secondary | ICD-10-CM | POA: Diagnosis not present

## 2019-07-26 DIAGNOSIS — N186 End stage renal disease: Secondary | ICD-10-CM | POA: Diagnosis not present

## 2019-07-26 DIAGNOSIS — N2581 Secondary hyperparathyroidism of renal origin: Secondary | ICD-10-CM | POA: Diagnosis not present

## 2019-07-26 DIAGNOSIS — E440 Moderate protein-calorie malnutrition: Secondary | ICD-10-CM | POA: Diagnosis not present

## 2019-07-26 DIAGNOSIS — Z992 Dependence on renal dialysis: Secondary | ICD-10-CM | POA: Diagnosis not present

## 2019-07-26 DIAGNOSIS — Z23 Encounter for immunization: Secondary | ICD-10-CM | POA: Diagnosis not present

## 2019-07-26 DIAGNOSIS — Z79899 Other long term (current) drug therapy: Secondary | ICD-10-CM | POA: Diagnosis not present

## 2019-07-26 DIAGNOSIS — R82998 Other abnormal findings in urine: Secondary | ICD-10-CM | POA: Diagnosis not present

## 2019-07-26 DIAGNOSIS — E878 Other disorders of electrolyte and fluid balance, not elsewhere classified: Secondary | ICD-10-CM | POA: Diagnosis not present

## 2019-07-26 DIAGNOSIS — E876 Hypokalemia: Secondary | ICD-10-CM | POA: Diagnosis not present

## 2019-07-26 DIAGNOSIS — R17 Unspecified jaundice: Secondary | ICD-10-CM | POA: Diagnosis not present

## 2019-07-26 DIAGNOSIS — D631 Anemia in chronic kidney disease: Secondary | ICD-10-CM | POA: Diagnosis not present

## 2019-07-27 DIAGNOSIS — Z79899 Other long term (current) drug therapy: Secondary | ICD-10-CM | POA: Diagnosis not present

## 2019-07-27 DIAGNOSIS — N186 End stage renal disease: Secondary | ICD-10-CM | POA: Diagnosis not present

## 2019-07-27 DIAGNOSIS — E876 Hypokalemia: Secondary | ICD-10-CM | POA: Diagnosis not present

## 2019-07-27 DIAGNOSIS — Z23 Encounter for immunization: Secondary | ICD-10-CM | POA: Diagnosis not present

## 2019-07-27 DIAGNOSIS — E878 Other disorders of electrolyte and fluid balance, not elsewhere classified: Secondary | ICD-10-CM | POA: Diagnosis not present

## 2019-07-27 DIAGNOSIS — R82998 Other abnormal findings in urine: Secondary | ICD-10-CM | POA: Diagnosis not present

## 2019-07-27 DIAGNOSIS — D631 Anemia in chronic kidney disease: Secondary | ICD-10-CM | POA: Diagnosis not present

## 2019-07-27 DIAGNOSIS — E440 Moderate protein-calorie malnutrition: Secondary | ICD-10-CM | POA: Diagnosis not present

## 2019-07-27 DIAGNOSIS — D509 Iron deficiency anemia, unspecified: Secondary | ICD-10-CM | POA: Diagnosis not present

## 2019-07-27 DIAGNOSIS — N2581 Secondary hyperparathyroidism of renal origin: Secondary | ICD-10-CM | POA: Diagnosis not present

## 2019-07-27 DIAGNOSIS — R17 Unspecified jaundice: Secondary | ICD-10-CM | POA: Diagnosis not present

## 2019-07-27 DIAGNOSIS — Z992 Dependence on renal dialysis: Secondary | ICD-10-CM | POA: Diagnosis not present

## 2019-07-28 DIAGNOSIS — Z23 Encounter for immunization: Secondary | ICD-10-CM | POA: Diagnosis not present

## 2019-07-28 DIAGNOSIS — E440 Moderate protein-calorie malnutrition: Secondary | ICD-10-CM | POA: Diagnosis not present

## 2019-07-28 DIAGNOSIS — E876 Hypokalemia: Secondary | ICD-10-CM | POA: Diagnosis not present

## 2019-07-28 DIAGNOSIS — N2581 Secondary hyperparathyroidism of renal origin: Secondary | ICD-10-CM | POA: Diagnosis not present

## 2019-07-28 DIAGNOSIS — D509 Iron deficiency anemia, unspecified: Secondary | ICD-10-CM | POA: Diagnosis not present

## 2019-07-28 DIAGNOSIS — Z992 Dependence on renal dialysis: Secondary | ICD-10-CM | POA: Diagnosis not present

## 2019-07-28 DIAGNOSIS — E878 Other disorders of electrolyte and fluid balance, not elsewhere classified: Secondary | ICD-10-CM | POA: Diagnosis not present

## 2019-07-28 DIAGNOSIS — D631 Anemia in chronic kidney disease: Secondary | ICD-10-CM | POA: Diagnosis not present

## 2019-07-28 DIAGNOSIS — Z79899 Other long term (current) drug therapy: Secondary | ICD-10-CM | POA: Diagnosis not present

## 2019-07-28 DIAGNOSIS — R82998 Other abnormal findings in urine: Secondary | ICD-10-CM | POA: Diagnosis not present

## 2019-07-28 DIAGNOSIS — N186 End stage renal disease: Secondary | ICD-10-CM | POA: Diagnosis not present

## 2019-07-28 DIAGNOSIS — R17 Unspecified jaundice: Secondary | ICD-10-CM | POA: Diagnosis not present

## 2019-07-29 DIAGNOSIS — E878 Other disorders of electrolyte and fluid balance, not elsewhere classified: Secondary | ICD-10-CM | POA: Diagnosis not present

## 2019-07-29 DIAGNOSIS — Z23 Encounter for immunization: Secondary | ICD-10-CM | POA: Diagnosis not present

## 2019-07-29 DIAGNOSIS — E440 Moderate protein-calorie malnutrition: Secondary | ICD-10-CM | POA: Diagnosis not present

## 2019-07-29 DIAGNOSIS — N2581 Secondary hyperparathyroidism of renal origin: Secondary | ICD-10-CM | POA: Diagnosis not present

## 2019-07-29 DIAGNOSIS — R82998 Other abnormal findings in urine: Secondary | ICD-10-CM | POA: Diagnosis not present

## 2019-07-29 DIAGNOSIS — Z992 Dependence on renal dialysis: Secondary | ICD-10-CM | POA: Diagnosis not present

## 2019-07-29 DIAGNOSIS — Z79899 Other long term (current) drug therapy: Secondary | ICD-10-CM | POA: Diagnosis not present

## 2019-07-29 DIAGNOSIS — N186 End stage renal disease: Secondary | ICD-10-CM | POA: Diagnosis not present

## 2019-07-29 DIAGNOSIS — R17 Unspecified jaundice: Secondary | ICD-10-CM | POA: Diagnosis not present

## 2019-07-29 DIAGNOSIS — E876 Hypokalemia: Secondary | ICD-10-CM | POA: Diagnosis not present

## 2019-07-29 DIAGNOSIS — D631 Anemia in chronic kidney disease: Secondary | ICD-10-CM | POA: Diagnosis not present

## 2019-07-29 DIAGNOSIS — D509 Iron deficiency anemia, unspecified: Secondary | ICD-10-CM | POA: Diagnosis not present

## 2019-07-30 DIAGNOSIS — E876 Hypokalemia: Secondary | ICD-10-CM | POA: Diagnosis not present

## 2019-07-30 DIAGNOSIS — D509 Iron deficiency anemia, unspecified: Secondary | ICD-10-CM | POA: Diagnosis not present

## 2019-07-30 DIAGNOSIS — D631 Anemia in chronic kidney disease: Secondary | ICD-10-CM | POA: Diagnosis not present

## 2019-07-30 DIAGNOSIS — N2581 Secondary hyperparathyroidism of renal origin: Secondary | ICD-10-CM | POA: Diagnosis not present

## 2019-07-30 DIAGNOSIS — E878 Other disorders of electrolyte and fluid balance, not elsewhere classified: Secondary | ICD-10-CM | POA: Diagnosis not present

## 2019-07-30 DIAGNOSIS — Z79899 Other long term (current) drug therapy: Secondary | ICD-10-CM | POA: Diagnosis not present

## 2019-07-30 DIAGNOSIS — N186 End stage renal disease: Secondary | ICD-10-CM | POA: Diagnosis not present

## 2019-07-30 DIAGNOSIS — R82998 Other abnormal findings in urine: Secondary | ICD-10-CM | POA: Diagnosis not present

## 2019-07-30 DIAGNOSIS — Z992 Dependence on renal dialysis: Secondary | ICD-10-CM | POA: Diagnosis not present

## 2019-07-30 DIAGNOSIS — E440 Moderate protein-calorie malnutrition: Secondary | ICD-10-CM | POA: Diagnosis not present

## 2019-07-30 DIAGNOSIS — R17 Unspecified jaundice: Secondary | ICD-10-CM | POA: Diagnosis not present

## 2019-07-30 DIAGNOSIS — Z23 Encounter for immunization: Secondary | ICD-10-CM | POA: Diagnosis not present

## 2019-07-31 DIAGNOSIS — E440 Moderate protein-calorie malnutrition: Secondary | ICD-10-CM | POA: Diagnosis not present

## 2019-07-31 DIAGNOSIS — D509 Iron deficiency anemia, unspecified: Secondary | ICD-10-CM | POA: Diagnosis not present

## 2019-07-31 DIAGNOSIS — D631 Anemia in chronic kidney disease: Secondary | ICD-10-CM | POA: Diagnosis not present

## 2019-07-31 DIAGNOSIS — Z79899 Other long term (current) drug therapy: Secondary | ICD-10-CM | POA: Diagnosis not present

## 2019-07-31 DIAGNOSIS — E876 Hypokalemia: Secondary | ICD-10-CM | POA: Diagnosis not present

## 2019-07-31 DIAGNOSIS — N2581 Secondary hyperparathyroidism of renal origin: Secondary | ICD-10-CM | POA: Diagnosis not present

## 2019-07-31 DIAGNOSIS — Z23 Encounter for immunization: Secondary | ICD-10-CM | POA: Diagnosis not present

## 2019-07-31 DIAGNOSIS — Z992 Dependence on renal dialysis: Secondary | ICD-10-CM | POA: Diagnosis not present

## 2019-07-31 DIAGNOSIS — R17 Unspecified jaundice: Secondary | ICD-10-CM | POA: Diagnosis not present

## 2019-07-31 DIAGNOSIS — N186 End stage renal disease: Secondary | ICD-10-CM | POA: Diagnosis not present

## 2019-07-31 DIAGNOSIS — R82998 Other abnormal findings in urine: Secondary | ICD-10-CM | POA: Diagnosis not present

## 2019-07-31 DIAGNOSIS — E878 Other disorders of electrolyte and fluid balance, not elsewhere classified: Secondary | ICD-10-CM | POA: Diagnosis not present

## 2019-08-01 DIAGNOSIS — D631 Anemia in chronic kidney disease: Secondary | ICD-10-CM | POA: Diagnosis not present

## 2019-08-01 DIAGNOSIS — E878 Other disorders of electrolyte and fluid balance, not elsewhere classified: Secondary | ICD-10-CM | POA: Diagnosis not present

## 2019-08-01 DIAGNOSIS — N2581 Secondary hyperparathyroidism of renal origin: Secondary | ICD-10-CM | POA: Diagnosis not present

## 2019-08-01 DIAGNOSIS — Z79899 Other long term (current) drug therapy: Secondary | ICD-10-CM | POA: Diagnosis not present

## 2019-08-01 DIAGNOSIS — D509 Iron deficiency anemia, unspecified: Secondary | ICD-10-CM | POA: Diagnosis not present

## 2019-08-01 DIAGNOSIS — N186 End stage renal disease: Secondary | ICD-10-CM | POA: Diagnosis not present

## 2019-08-01 DIAGNOSIS — E440 Moderate protein-calorie malnutrition: Secondary | ICD-10-CM | POA: Diagnosis not present

## 2019-08-01 DIAGNOSIS — R82998 Other abnormal findings in urine: Secondary | ICD-10-CM | POA: Diagnosis not present

## 2019-08-01 DIAGNOSIS — E876 Hypokalemia: Secondary | ICD-10-CM | POA: Diagnosis not present

## 2019-08-01 DIAGNOSIS — R17 Unspecified jaundice: Secondary | ICD-10-CM | POA: Diagnosis not present

## 2019-08-01 DIAGNOSIS — Z992 Dependence on renal dialysis: Secondary | ICD-10-CM | POA: Diagnosis not present

## 2019-08-01 DIAGNOSIS — Z23 Encounter for immunization: Secondary | ICD-10-CM | POA: Diagnosis not present

## 2019-08-02 DIAGNOSIS — Z23 Encounter for immunization: Secondary | ICD-10-CM | POA: Diagnosis not present

## 2019-08-02 DIAGNOSIS — D631 Anemia in chronic kidney disease: Secondary | ICD-10-CM | POA: Diagnosis not present

## 2019-08-02 DIAGNOSIS — R82998 Other abnormal findings in urine: Secondary | ICD-10-CM | POA: Diagnosis not present

## 2019-08-02 DIAGNOSIS — R17 Unspecified jaundice: Secondary | ICD-10-CM | POA: Diagnosis not present

## 2019-08-02 DIAGNOSIS — E440 Moderate protein-calorie malnutrition: Secondary | ICD-10-CM | POA: Diagnosis not present

## 2019-08-02 DIAGNOSIS — N2581 Secondary hyperparathyroidism of renal origin: Secondary | ICD-10-CM | POA: Diagnosis not present

## 2019-08-02 DIAGNOSIS — Z79899 Other long term (current) drug therapy: Secondary | ICD-10-CM | POA: Diagnosis not present

## 2019-08-02 DIAGNOSIS — E878 Other disorders of electrolyte and fluid balance, not elsewhere classified: Secondary | ICD-10-CM | POA: Diagnosis not present

## 2019-08-02 DIAGNOSIS — Z992 Dependence on renal dialysis: Secondary | ICD-10-CM | POA: Diagnosis not present

## 2019-08-02 DIAGNOSIS — E876 Hypokalemia: Secondary | ICD-10-CM | POA: Diagnosis not present

## 2019-08-02 DIAGNOSIS — N186 End stage renal disease: Secondary | ICD-10-CM | POA: Diagnosis not present

## 2019-08-02 DIAGNOSIS — D509 Iron deficiency anemia, unspecified: Secondary | ICD-10-CM | POA: Diagnosis not present

## 2019-08-03 DIAGNOSIS — Z992 Dependence on renal dialysis: Secondary | ICD-10-CM | POA: Diagnosis not present

## 2019-08-03 DIAGNOSIS — Z79899 Other long term (current) drug therapy: Secondary | ICD-10-CM | POA: Diagnosis not present

## 2019-08-03 DIAGNOSIS — Z23 Encounter for immunization: Secondary | ICD-10-CM | POA: Diagnosis not present

## 2019-08-03 DIAGNOSIS — E878 Other disorders of electrolyte and fluid balance, not elsewhere classified: Secondary | ICD-10-CM | POA: Diagnosis not present

## 2019-08-03 DIAGNOSIS — D631 Anemia in chronic kidney disease: Secondary | ICD-10-CM | POA: Diagnosis not present

## 2019-08-03 DIAGNOSIS — D509 Iron deficiency anemia, unspecified: Secondary | ICD-10-CM | POA: Diagnosis not present

## 2019-08-03 DIAGNOSIS — E440 Moderate protein-calorie malnutrition: Secondary | ICD-10-CM | POA: Diagnosis not present

## 2019-08-03 DIAGNOSIS — R82998 Other abnormal findings in urine: Secondary | ICD-10-CM | POA: Diagnosis not present

## 2019-08-03 DIAGNOSIS — R17 Unspecified jaundice: Secondary | ICD-10-CM | POA: Diagnosis not present

## 2019-08-03 DIAGNOSIS — E876 Hypokalemia: Secondary | ICD-10-CM | POA: Diagnosis not present

## 2019-08-03 DIAGNOSIS — N2581 Secondary hyperparathyroidism of renal origin: Secondary | ICD-10-CM | POA: Diagnosis not present

## 2019-08-03 DIAGNOSIS — N186 End stage renal disease: Secondary | ICD-10-CM | POA: Diagnosis not present

## 2019-08-04 DIAGNOSIS — D631 Anemia in chronic kidney disease: Secondary | ICD-10-CM | POA: Diagnosis not present

## 2019-08-04 DIAGNOSIS — E440 Moderate protein-calorie malnutrition: Secondary | ICD-10-CM | POA: Diagnosis not present

## 2019-08-04 DIAGNOSIS — R82998 Other abnormal findings in urine: Secondary | ICD-10-CM | POA: Diagnosis not present

## 2019-08-04 DIAGNOSIS — N186 End stage renal disease: Secondary | ICD-10-CM | POA: Diagnosis not present

## 2019-08-04 DIAGNOSIS — Z79899 Other long term (current) drug therapy: Secondary | ICD-10-CM | POA: Diagnosis not present

## 2019-08-04 DIAGNOSIS — N2581 Secondary hyperparathyroidism of renal origin: Secondary | ICD-10-CM | POA: Diagnosis not present

## 2019-08-04 DIAGNOSIS — Z23 Encounter for immunization: Secondary | ICD-10-CM | POA: Diagnosis not present

## 2019-08-04 DIAGNOSIS — Z992 Dependence on renal dialysis: Secondary | ICD-10-CM | POA: Diagnosis not present

## 2019-08-04 DIAGNOSIS — D509 Iron deficiency anemia, unspecified: Secondary | ICD-10-CM | POA: Diagnosis not present

## 2019-08-04 DIAGNOSIS — E878 Other disorders of electrolyte and fluid balance, not elsewhere classified: Secondary | ICD-10-CM | POA: Diagnosis not present

## 2019-08-04 DIAGNOSIS — R17 Unspecified jaundice: Secondary | ICD-10-CM | POA: Diagnosis not present

## 2019-08-04 DIAGNOSIS — E876 Hypokalemia: Secondary | ICD-10-CM | POA: Diagnosis not present

## 2019-08-05 DIAGNOSIS — E440 Moderate protein-calorie malnutrition: Secondary | ICD-10-CM | POA: Diagnosis not present

## 2019-08-05 DIAGNOSIS — N2581 Secondary hyperparathyroidism of renal origin: Secondary | ICD-10-CM | POA: Diagnosis not present

## 2019-08-05 DIAGNOSIS — Z79899 Other long term (current) drug therapy: Secondary | ICD-10-CM | POA: Diagnosis not present

## 2019-08-05 DIAGNOSIS — D509 Iron deficiency anemia, unspecified: Secondary | ICD-10-CM | POA: Diagnosis not present

## 2019-08-05 DIAGNOSIS — Z23 Encounter for immunization: Secondary | ICD-10-CM | POA: Diagnosis not present

## 2019-08-05 DIAGNOSIS — R17 Unspecified jaundice: Secondary | ICD-10-CM | POA: Diagnosis not present

## 2019-08-05 DIAGNOSIS — N186 End stage renal disease: Secondary | ICD-10-CM | POA: Diagnosis not present

## 2019-08-05 DIAGNOSIS — D631 Anemia in chronic kidney disease: Secondary | ICD-10-CM | POA: Diagnosis not present

## 2019-08-05 DIAGNOSIS — Z992 Dependence on renal dialysis: Secondary | ICD-10-CM | POA: Diagnosis not present

## 2019-08-05 DIAGNOSIS — E878 Other disorders of electrolyte and fluid balance, not elsewhere classified: Secondary | ICD-10-CM | POA: Diagnosis not present

## 2019-08-05 DIAGNOSIS — R82998 Other abnormal findings in urine: Secondary | ICD-10-CM | POA: Diagnosis not present

## 2019-08-05 DIAGNOSIS — E876 Hypokalemia: Secondary | ICD-10-CM | POA: Diagnosis not present

## 2019-08-06 DIAGNOSIS — Z23 Encounter for immunization: Secondary | ICD-10-CM | POA: Diagnosis not present

## 2019-08-06 DIAGNOSIS — D631 Anemia in chronic kidney disease: Secondary | ICD-10-CM | POA: Diagnosis not present

## 2019-08-06 DIAGNOSIS — N186 End stage renal disease: Secondary | ICD-10-CM | POA: Diagnosis not present

## 2019-08-06 DIAGNOSIS — Z79899 Other long term (current) drug therapy: Secondary | ICD-10-CM | POA: Diagnosis not present

## 2019-08-06 DIAGNOSIS — E440 Moderate protein-calorie malnutrition: Secondary | ICD-10-CM | POA: Diagnosis not present

## 2019-08-06 DIAGNOSIS — D509 Iron deficiency anemia, unspecified: Secondary | ICD-10-CM | POA: Diagnosis not present

## 2019-08-06 DIAGNOSIS — R17 Unspecified jaundice: Secondary | ICD-10-CM | POA: Diagnosis not present

## 2019-08-06 DIAGNOSIS — E878 Other disorders of electrolyte and fluid balance, not elsewhere classified: Secondary | ICD-10-CM | POA: Diagnosis not present

## 2019-08-06 DIAGNOSIS — R82998 Other abnormal findings in urine: Secondary | ICD-10-CM | POA: Diagnosis not present

## 2019-08-06 DIAGNOSIS — N2581 Secondary hyperparathyroidism of renal origin: Secondary | ICD-10-CM | POA: Diagnosis not present

## 2019-08-06 DIAGNOSIS — Z992 Dependence on renal dialysis: Secondary | ICD-10-CM | POA: Diagnosis not present

## 2019-08-06 DIAGNOSIS — E876 Hypokalemia: Secondary | ICD-10-CM | POA: Diagnosis not present

## 2019-08-07 DIAGNOSIS — R82998 Other abnormal findings in urine: Secondary | ICD-10-CM | POA: Diagnosis not present

## 2019-08-07 DIAGNOSIS — N2581 Secondary hyperparathyroidism of renal origin: Secondary | ICD-10-CM | POA: Diagnosis not present

## 2019-08-07 DIAGNOSIS — Z23 Encounter for immunization: Secondary | ICD-10-CM | POA: Diagnosis not present

## 2019-08-07 DIAGNOSIS — D631 Anemia in chronic kidney disease: Secondary | ICD-10-CM | POA: Diagnosis not present

## 2019-08-07 DIAGNOSIS — D509 Iron deficiency anemia, unspecified: Secondary | ICD-10-CM | POA: Diagnosis not present

## 2019-08-07 DIAGNOSIS — E876 Hypokalemia: Secondary | ICD-10-CM | POA: Diagnosis not present

## 2019-08-07 DIAGNOSIS — Z992 Dependence on renal dialysis: Secondary | ICD-10-CM | POA: Diagnosis not present

## 2019-08-07 DIAGNOSIS — R17 Unspecified jaundice: Secondary | ICD-10-CM | POA: Diagnosis not present

## 2019-08-07 DIAGNOSIS — E440 Moderate protein-calorie malnutrition: Secondary | ICD-10-CM | POA: Diagnosis not present

## 2019-08-07 DIAGNOSIS — N186 End stage renal disease: Secondary | ICD-10-CM | POA: Diagnosis not present

## 2019-08-07 DIAGNOSIS — Z79899 Other long term (current) drug therapy: Secondary | ICD-10-CM | POA: Diagnosis not present

## 2019-08-07 DIAGNOSIS — E878 Other disorders of electrolyte and fluid balance, not elsewhere classified: Secondary | ICD-10-CM | POA: Diagnosis not present

## 2019-08-08 DIAGNOSIS — Z23 Encounter for immunization: Secondary | ICD-10-CM | POA: Diagnosis not present

## 2019-08-08 DIAGNOSIS — R17 Unspecified jaundice: Secondary | ICD-10-CM | POA: Diagnosis not present

## 2019-08-08 DIAGNOSIS — E440 Moderate protein-calorie malnutrition: Secondary | ICD-10-CM | POA: Diagnosis not present

## 2019-08-08 DIAGNOSIS — E878 Other disorders of electrolyte and fluid balance, not elsewhere classified: Secondary | ICD-10-CM | POA: Diagnosis not present

## 2019-08-08 DIAGNOSIS — N2581 Secondary hyperparathyroidism of renal origin: Secondary | ICD-10-CM | POA: Diagnosis not present

## 2019-08-08 DIAGNOSIS — Z992 Dependence on renal dialysis: Secondary | ICD-10-CM | POA: Diagnosis not present

## 2019-08-08 DIAGNOSIS — Z79899 Other long term (current) drug therapy: Secondary | ICD-10-CM | POA: Diagnosis not present

## 2019-08-08 DIAGNOSIS — R82998 Other abnormal findings in urine: Secondary | ICD-10-CM | POA: Diagnosis not present

## 2019-08-08 DIAGNOSIS — D631 Anemia in chronic kidney disease: Secondary | ICD-10-CM | POA: Diagnosis not present

## 2019-08-08 DIAGNOSIS — N186 End stage renal disease: Secondary | ICD-10-CM | POA: Diagnosis not present

## 2019-08-08 DIAGNOSIS — D509 Iron deficiency anemia, unspecified: Secondary | ICD-10-CM | POA: Diagnosis not present

## 2019-08-08 DIAGNOSIS — E876 Hypokalemia: Secondary | ICD-10-CM | POA: Diagnosis not present

## 2019-08-09 DIAGNOSIS — N186 End stage renal disease: Secondary | ICD-10-CM | POA: Diagnosis not present

## 2019-08-09 DIAGNOSIS — Z79899 Other long term (current) drug therapy: Secondary | ICD-10-CM | POA: Diagnosis not present

## 2019-08-09 DIAGNOSIS — D509 Iron deficiency anemia, unspecified: Secondary | ICD-10-CM | POA: Diagnosis not present

## 2019-08-09 DIAGNOSIS — D631 Anemia in chronic kidney disease: Secondary | ICD-10-CM | POA: Diagnosis not present

## 2019-08-09 DIAGNOSIS — R17 Unspecified jaundice: Secondary | ICD-10-CM | POA: Diagnosis not present

## 2019-08-09 DIAGNOSIS — Z992 Dependence on renal dialysis: Secondary | ICD-10-CM | POA: Diagnosis not present

## 2019-08-09 DIAGNOSIS — E876 Hypokalemia: Secondary | ICD-10-CM | POA: Diagnosis not present

## 2019-08-09 DIAGNOSIS — Z23 Encounter for immunization: Secondary | ICD-10-CM | POA: Diagnosis not present

## 2019-08-09 DIAGNOSIS — R82998 Other abnormal findings in urine: Secondary | ICD-10-CM | POA: Diagnosis not present

## 2019-08-09 DIAGNOSIS — E878 Other disorders of electrolyte and fluid balance, not elsewhere classified: Secondary | ICD-10-CM | POA: Diagnosis not present

## 2019-08-09 DIAGNOSIS — E440 Moderate protein-calorie malnutrition: Secondary | ICD-10-CM | POA: Diagnosis not present

## 2019-08-09 DIAGNOSIS — N2581 Secondary hyperparathyroidism of renal origin: Secondary | ICD-10-CM | POA: Diagnosis not present

## 2019-08-10 DIAGNOSIS — Z79899 Other long term (current) drug therapy: Secondary | ICD-10-CM | POA: Diagnosis not present

## 2019-08-10 DIAGNOSIS — Z23 Encounter for immunization: Secondary | ICD-10-CM | POA: Diagnosis not present

## 2019-08-10 DIAGNOSIS — E876 Hypokalemia: Secondary | ICD-10-CM | POA: Diagnosis not present

## 2019-08-10 DIAGNOSIS — N2581 Secondary hyperparathyroidism of renal origin: Secondary | ICD-10-CM | POA: Diagnosis not present

## 2019-08-10 DIAGNOSIS — Z992 Dependence on renal dialysis: Secondary | ICD-10-CM | POA: Diagnosis not present

## 2019-08-10 DIAGNOSIS — R17 Unspecified jaundice: Secondary | ICD-10-CM | POA: Diagnosis not present

## 2019-08-10 DIAGNOSIS — E440 Moderate protein-calorie malnutrition: Secondary | ICD-10-CM | POA: Diagnosis not present

## 2019-08-10 DIAGNOSIS — R82998 Other abnormal findings in urine: Secondary | ICD-10-CM | POA: Diagnosis not present

## 2019-08-10 DIAGNOSIS — E878 Other disorders of electrolyte and fluid balance, not elsewhere classified: Secondary | ICD-10-CM | POA: Diagnosis not present

## 2019-08-10 DIAGNOSIS — N186 End stage renal disease: Secondary | ICD-10-CM | POA: Diagnosis not present

## 2019-08-10 DIAGNOSIS — D631 Anemia in chronic kidney disease: Secondary | ICD-10-CM | POA: Diagnosis not present

## 2019-08-10 DIAGNOSIS — D509 Iron deficiency anemia, unspecified: Secondary | ICD-10-CM | POA: Diagnosis not present

## 2019-08-11 DIAGNOSIS — N186 End stage renal disease: Secondary | ICD-10-CM | POA: Diagnosis not present

## 2019-08-11 DIAGNOSIS — E440 Moderate protein-calorie malnutrition: Secondary | ICD-10-CM | POA: Diagnosis not present

## 2019-08-11 DIAGNOSIS — Z23 Encounter for immunization: Secondary | ICD-10-CM | POA: Diagnosis not present

## 2019-08-11 DIAGNOSIS — Z79899 Other long term (current) drug therapy: Secondary | ICD-10-CM | POA: Diagnosis not present

## 2019-08-11 DIAGNOSIS — N2581 Secondary hyperparathyroidism of renal origin: Secondary | ICD-10-CM | POA: Diagnosis not present

## 2019-08-11 DIAGNOSIS — E876 Hypokalemia: Secondary | ICD-10-CM | POA: Diagnosis not present

## 2019-08-11 DIAGNOSIS — Z992 Dependence on renal dialysis: Secondary | ICD-10-CM | POA: Diagnosis not present

## 2019-08-11 DIAGNOSIS — R82998 Other abnormal findings in urine: Secondary | ICD-10-CM | POA: Diagnosis not present

## 2019-08-11 DIAGNOSIS — D631 Anemia in chronic kidney disease: Secondary | ICD-10-CM | POA: Diagnosis not present

## 2019-08-11 DIAGNOSIS — D509 Iron deficiency anemia, unspecified: Secondary | ICD-10-CM | POA: Diagnosis not present

## 2019-08-11 DIAGNOSIS — E878 Other disorders of electrolyte and fluid balance, not elsewhere classified: Secondary | ICD-10-CM | POA: Diagnosis not present

## 2019-08-11 DIAGNOSIS — R17 Unspecified jaundice: Secondary | ICD-10-CM | POA: Diagnosis not present

## 2019-08-12 DIAGNOSIS — R17 Unspecified jaundice: Secondary | ICD-10-CM | POA: Diagnosis not present

## 2019-08-12 DIAGNOSIS — R82998 Other abnormal findings in urine: Secondary | ICD-10-CM | POA: Diagnosis not present

## 2019-08-12 DIAGNOSIS — N2581 Secondary hyperparathyroidism of renal origin: Secondary | ICD-10-CM | POA: Diagnosis not present

## 2019-08-12 DIAGNOSIS — N186 End stage renal disease: Secondary | ICD-10-CM | POA: Diagnosis not present

## 2019-08-12 DIAGNOSIS — E440 Moderate protein-calorie malnutrition: Secondary | ICD-10-CM | POA: Diagnosis not present

## 2019-08-12 DIAGNOSIS — Z23 Encounter for immunization: Secondary | ICD-10-CM | POA: Diagnosis not present

## 2019-08-12 DIAGNOSIS — E878 Other disorders of electrolyte and fluid balance, not elsewhere classified: Secondary | ICD-10-CM | POA: Diagnosis not present

## 2019-08-12 DIAGNOSIS — Z992 Dependence on renal dialysis: Secondary | ICD-10-CM | POA: Diagnosis not present

## 2019-08-12 DIAGNOSIS — E876 Hypokalemia: Secondary | ICD-10-CM | POA: Diagnosis not present

## 2019-08-12 DIAGNOSIS — D509 Iron deficiency anemia, unspecified: Secondary | ICD-10-CM | POA: Diagnosis not present

## 2019-08-12 DIAGNOSIS — Z79899 Other long term (current) drug therapy: Secondary | ICD-10-CM | POA: Diagnosis not present

## 2019-08-12 DIAGNOSIS — D631 Anemia in chronic kidney disease: Secondary | ICD-10-CM | POA: Diagnosis not present

## 2019-08-13 DIAGNOSIS — Z23 Encounter for immunization: Secondary | ICD-10-CM | POA: Diagnosis not present

## 2019-08-13 DIAGNOSIS — N186 End stage renal disease: Secondary | ICD-10-CM | POA: Diagnosis not present

## 2019-08-13 DIAGNOSIS — R17 Unspecified jaundice: Secondary | ICD-10-CM | POA: Diagnosis not present

## 2019-08-13 DIAGNOSIS — E876 Hypokalemia: Secondary | ICD-10-CM | POA: Diagnosis not present

## 2019-08-13 DIAGNOSIS — Z992 Dependence on renal dialysis: Secondary | ICD-10-CM | POA: Diagnosis not present

## 2019-08-13 DIAGNOSIS — R82998 Other abnormal findings in urine: Secondary | ICD-10-CM | POA: Diagnosis not present

## 2019-08-13 DIAGNOSIS — D509 Iron deficiency anemia, unspecified: Secondary | ICD-10-CM | POA: Diagnosis not present

## 2019-08-13 DIAGNOSIS — E878 Other disorders of electrolyte and fluid balance, not elsewhere classified: Secondary | ICD-10-CM | POA: Diagnosis not present

## 2019-08-13 DIAGNOSIS — Z79899 Other long term (current) drug therapy: Secondary | ICD-10-CM | POA: Diagnosis not present

## 2019-08-13 DIAGNOSIS — D631 Anemia in chronic kidney disease: Secondary | ICD-10-CM | POA: Diagnosis not present

## 2019-08-13 DIAGNOSIS — E440 Moderate protein-calorie malnutrition: Secondary | ICD-10-CM | POA: Diagnosis not present

## 2019-08-13 DIAGNOSIS — N2581 Secondary hyperparathyroidism of renal origin: Secondary | ICD-10-CM | POA: Diagnosis not present

## 2019-08-14 ENCOUNTER — Other Ambulatory Visit: Payer: Self-pay

## 2019-08-14 ENCOUNTER — Ambulatory Visit: Payer: Medicare Other | Admitting: Neurology

## 2019-08-14 DIAGNOSIS — E440 Moderate protein-calorie malnutrition: Secondary | ICD-10-CM | POA: Diagnosis not present

## 2019-08-14 DIAGNOSIS — Z992 Dependence on renal dialysis: Secondary | ICD-10-CM | POA: Diagnosis not present

## 2019-08-14 DIAGNOSIS — R17 Unspecified jaundice: Secondary | ICD-10-CM | POA: Diagnosis not present

## 2019-08-14 DIAGNOSIS — Z23 Encounter for immunization: Secondary | ICD-10-CM | POA: Diagnosis not present

## 2019-08-14 DIAGNOSIS — N186 End stage renal disease: Secondary | ICD-10-CM | POA: Diagnosis not present

## 2019-08-14 DIAGNOSIS — N2581 Secondary hyperparathyroidism of renal origin: Secondary | ICD-10-CM | POA: Diagnosis not present

## 2019-08-14 DIAGNOSIS — E878 Other disorders of electrolyte and fluid balance, not elsewhere classified: Secondary | ICD-10-CM | POA: Diagnosis not present

## 2019-08-14 DIAGNOSIS — E876 Hypokalemia: Secondary | ICD-10-CM | POA: Diagnosis not present

## 2019-08-14 DIAGNOSIS — D631 Anemia in chronic kidney disease: Secondary | ICD-10-CM | POA: Diagnosis not present

## 2019-08-14 DIAGNOSIS — Z79899 Other long term (current) drug therapy: Secondary | ICD-10-CM | POA: Diagnosis not present

## 2019-08-14 DIAGNOSIS — D509 Iron deficiency anemia, unspecified: Secondary | ICD-10-CM | POA: Diagnosis not present

## 2019-08-14 DIAGNOSIS — G5603 Carpal tunnel syndrome, bilateral upper limbs: Secondary | ICD-10-CM | POA: Diagnosis not present

## 2019-08-14 DIAGNOSIS — R82998 Other abnormal findings in urine: Secondary | ICD-10-CM | POA: Diagnosis not present

## 2019-08-14 DIAGNOSIS — G629 Polyneuropathy, unspecified: Secondary | ICD-10-CM

## 2019-08-14 NOTE — Procedures (Signed)
[facility]  [place], [place]  [place], [place] [address] Tel: [phone] Fax:  [phone] Test Date:  08/14/2019  Patient: Eileen Perez DOB: [birth year] Physician: Narda Amber, DO  Sex: Female Height: 5\' 1"  Ref Phys: Leanora Cover, MD  ID#: [id number] Temp: 32.0C Technician:    Patient Complaints: This is a 78 year old female referred for evaluation of 1 year history of bilateral hand numbness and tingling.  NCV & EMG Findings: Extensive electrodiagnostic testing of the right upper extremity and additional studies of the left shows:  1. Bilateral median sensory responses show prolonged latency (R6.8, L5.4 ms) and reduced amplitude (R3.5, L8.1 V).  Bilateral ulnar sensory responses are absent.  Radial sensory response on the left is reduced (7.0 V) and normal on the right. 2. There is diffuse, latency, reduced amplitudes, and conduction velocity slowing involving bilateral median and ulnar motor nerves.   3. Chronic motor axonal loss changes are seen affecting bilateral distal hand muscles as well as the right triceps and left biceps and deltoid muscles.  Active denervation is isolated to the right first dorsal interosseous muscle.  Impression: 1. 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  Overall, these findings are moderate in degree electrically.  Formal neurological consultation recommended. 2. Chronic right C8 radiculopathy, moderate. 3. Chronic left C5 radiculopathy, moderate.     ___________________________ Narda Amber, DO    Nerve Conduction Studies Anti Sensory Summary Table   Site NR Peak (ms) Norm Peak (ms) P-T Amp (V) Norm P-T Amp  Left Median Anti Sensory (2nd Digit)  32C  Wrist    5.4 <3.8 8.1 >10  Right Median Anti Sensory (2nd Digit)  32C  Wrist    6.8 <3.8 3.5 >10  Left Radial Anti Sensory (Base 1st Digit)  32C   Wrist    2.8 <2.8 7.0 >10  Right Radial Anti Sensory (Base 1st Digit)  32C  Wrist    2.6 <2.8 12.0 >10  Left Ulnar Anti Sensory (5th Digit)  32C  Wrist NR  <3.2  >5  Right Ulnar Anti Sensory (5th Digit)  32C  Wrist NR  <3.2  >5   Motor Summary Table   Site NR Onset (ms) Norm Onset (ms) O-P Amp (mV) Norm O-P Amp Site1 Site2 Delta-0 (ms) Dist (cm) Vel (m/s) Norm Vel (m/s)  Left Median Motor (Abd Poll Brev)  32C  Wrist    4.9 <4.0 4.1 >5 Elbow Wrist 6.2 27.0 44 >50  Elbow    11.1  4.1         Right Median Motor (Abd Poll Brev)  32C  Wrist    5.7 <4.0 4.2 >5 Elbow Wrist 7.0 29.0 41 >50  Elbow    12.7  3.4         Left Ulnar Motor (Abd Dig Minimi)  32C  Wrist    4.2 <3.1 5.4 >7 B Elbow Wrist 5.9 22.0 37 >50  B Elbow    10.1  4.2  A Elbow B Elbow 3.8 10.0 26 >50  A Elbow    13.9  3.9         Right Ulnar Motor (Abd Dig Minimi)  32C  Wrist    3.8 <3.1 5.3 >7 B Elbow Wrist 4.8 23.0 48 >50  B Elbow    8.6  4.4  A Elbow B Elbow 3.8 10.0 26 >50  A Elbow    12.4  4.2  EMG   Side Muscle Ins Act Fibs Psw Fasc Number Recrt Dur Dur. Amp Amp. Poly Poly. Comment  Right 1stDorInt Nml 1+ Nml Nml 3- Rapid Most 1+ Most 1+ Most 1+ ATR  Right ABD Dig Min Nml Nml Nml Nml 3- Rapid Most 1+ Most 1+ Most 1+ ATR  Right Ext Indicis Nml Nml Nml Nml 2- Rapid Some 1+ Some 1+ Nml Nml N/A  Right Abd Poll Brev Nml Nml Nml Nml 3- Rapid Most 1+ Most 1+ Most 1+ ATR  Right Biceps Nml Nml Nml Nml Nml Nml Nml Nml Nml Nml Nml Nml N/A  Right PronatorTeres Nml Nml Nml Nml Nml Nml Nml Nml Nml Nml Nml Nml N/A  Right Triceps Nml Nml Nml Nml 2- Rapid Many 1+ Many 1+ Many 1+ N/A  Right Deltoid Nml Nml Nml Nml Nml Nml Nml Nml Nml Nml Nml Nml N/A  Left 1stDorInt Nml Nml Nml Nml 2- Rapid Many 1+ Many 1+ Many 1+ ATR  Left Abd Poll Brev Nml Nml Nml Nml 2- Rapid Most 2+ Most 1+ Nml Nml ATR  Left Ext Indicis Nml Nml Nml Nml 2- Rapid Some 1+ Some 1+ Nml Nml N/A  Left PronatorTeres Nml Nml Nml Nml Nml Nml Nml Nml Nml Nml  Nml Nml N/A  Left Biceps Nml Nml Nml Nml 1- Rapid Some 1+ Some 1+ Nml Nml N/A  Left Triceps Nml Nml Nml Nml Nml Nml Nml Nml Nml Nml Nml Nml N/A  Left Deltoid Nml Nml Nml Nml 2- Rapid Many 1+ Many 1+ Many 1+ N/A      Waveforms:

## 2019-08-15 DIAGNOSIS — E440 Moderate protein-calorie malnutrition: Secondary | ICD-10-CM | POA: Diagnosis not present

## 2019-08-15 DIAGNOSIS — Z79899 Other long term (current) drug therapy: Secondary | ICD-10-CM | POA: Diagnosis not present

## 2019-08-15 DIAGNOSIS — E878 Other disorders of electrolyte and fluid balance, not elsewhere classified: Secondary | ICD-10-CM | POA: Diagnosis not present

## 2019-08-15 DIAGNOSIS — D509 Iron deficiency anemia, unspecified: Secondary | ICD-10-CM | POA: Diagnosis not present

## 2019-08-15 DIAGNOSIS — R82998 Other abnormal findings in urine: Secondary | ICD-10-CM | POA: Diagnosis not present

## 2019-08-15 DIAGNOSIS — Z23 Encounter for immunization: Secondary | ICD-10-CM | POA: Diagnosis not present

## 2019-08-15 DIAGNOSIS — E876 Hypokalemia: Secondary | ICD-10-CM | POA: Diagnosis not present

## 2019-08-15 DIAGNOSIS — D631 Anemia in chronic kidney disease: Secondary | ICD-10-CM | POA: Diagnosis not present

## 2019-08-15 DIAGNOSIS — R17 Unspecified jaundice: Secondary | ICD-10-CM | POA: Diagnosis not present

## 2019-08-15 DIAGNOSIS — N2581 Secondary hyperparathyroidism of renal origin: Secondary | ICD-10-CM | POA: Diagnosis not present

## 2019-08-15 DIAGNOSIS — Z992 Dependence on renal dialysis: Secondary | ICD-10-CM | POA: Diagnosis not present

## 2019-08-15 DIAGNOSIS — N186 End stage renal disease: Secondary | ICD-10-CM | POA: Diagnosis not present

## 2019-08-16 DIAGNOSIS — Z79899 Other long term (current) drug therapy: Secondary | ICD-10-CM | POA: Diagnosis not present

## 2019-08-16 DIAGNOSIS — N2581 Secondary hyperparathyroidism of renal origin: Secondary | ICD-10-CM | POA: Diagnosis not present

## 2019-08-16 DIAGNOSIS — E878 Other disorders of electrolyte and fluid balance, not elsewhere classified: Secondary | ICD-10-CM | POA: Diagnosis not present

## 2019-08-16 DIAGNOSIS — N186 End stage renal disease: Secondary | ICD-10-CM | POA: Diagnosis not present

## 2019-08-16 DIAGNOSIS — D631 Anemia in chronic kidney disease: Secondary | ICD-10-CM | POA: Diagnosis not present

## 2019-08-16 DIAGNOSIS — D509 Iron deficiency anemia, unspecified: Secondary | ICD-10-CM | POA: Diagnosis not present

## 2019-08-16 DIAGNOSIS — E440 Moderate protein-calorie malnutrition: Secondary | ICD-10-CM | POA: Diagnosis not present

## 2019-08-16 DIAGNOSIS — R82998 Other abnormal findings in urine: Secondary | ICD-10-CM | POA: Diagnosis not present

## 2019-08-16 DIAGNOSIS — E876 Hypokalemia: Secondary | ICD-10-CM | POA: Diagnosis not present

## 2019-08-16 DIAGNOSIS — Z992 Dependence on renal dialysis: Secondary | ICD-10-CM | POA: Diagnosis not present

## 2019-08-16 DIAGNOSIS — R17 Unspecified jaundice: Secondary | ICD-10-CM | POA: Diagnosis not present

## 2019-08-16 DIAGNOSIS — Z23 Encounter for immunization: Secondary | ICD-10-CM | POA: Diagnosis not present

## 2019-08-17 DIAGNOSIS — R17 Unspecified jaundice: Secondary | ICD-10-CM | POA: Diagnosis not present

## 2019-08-17 DIAGNOSIS — D509 Iron deficiency anemia, unspecified: Secondary | ICD-10-CM | POA: Diagnosis not present

## 2019-08-17 DIAGNOSIS — N2581 Secondary hyperparathyroidism of renal origin: Secondary | ICD-10-CM | POA: Diagnosis not present

## 2019-08-17 DIAGNOSIS — E878 Other disorders of electrolyte and fluid balance, not elsewhere classified: Secondary | ICD-10-CM | POA: Diagnosis not present

## 2019-08-17 DIAGNOSIS — E876 Hypokalemia: Secondary | ICD-10-CM | POA: Diagnosis not present

## 2019-08-17 DIAGNOSIS — D631 Anemia in chronic kidney disease: Secondary | ICD-10-CM | POA: Diagnosis not present

## 2019-08-17 DIAGNOSIS — Z79899 Other long term (current) drug therapy: Secondary | ICD-10-CM | POA: Diagnosis not present

## 2019-08-17 DIAGNOSIS — R82998 Other abnormal findings in urine: Secondary | ICD-10-CM | POA: Diagnosis not present

## 2019-08-17 DIAGNOSIS — N186 End stage renal disease: Secondary | ICD-10-CM | POA: Diagnosis not present

## 2019-08-17 DIAGNOSIS — Z992 Dependence on renal dialysis: Secondary | ICD-10-CM | POA: Diagnosis not present

## 2019-08-17 DIAGNOSIS — E440 Moderate protein-calorie malnutrition: Secondary | ICD-10-CM | POA: Diagnosis not present

## 2019-08-17 DIAGNOSIS — Z23 Encounter for immunization: Secondary | ICD-10-CM | POA: Diagnosis not present

## 2019-08-18 DIAGNOSIS — N186 End stage renal disease: Secondary | ICD-10-CM | POA: Diagnosis not present

## 2019-08-18 DIAGNOSIS — D509 Iron deficiency anemia, unspecified: Secondary | ICD-10-CM | POA: Diagnosis not present

## 2019-08-18 DIAGNOSIS — Z992 Dependence on renal dialysis: Secondary | ICD-10-CM | POA: Diagnosis not present

## 2019-08-18 DIAGNOSIS — E876 Hypokalemia: Secondary | ICD-10-CM | POA: Diagnosis not present

## 2019-08-18 DIAGNOSIS — D631 Anemia in chronic kidney disease: Secondary | ICD-10-CM | POA: Diagnosis not present

## 2019-08-18 DIAGNOSIS — N2581 Secondary hyperparathyroidism of renal origin: Secondary | ICD-10-CM | POA: Diagnosis not present

## 2019-08-18 DIAGNOSIS — R17 Unspecified jaundice: Secondary | ICD-10-CM | POA: Diagnosis not present

## 2019-08-18 DIAGNOSIS — Z79899 Other long term (current) drug therapy: Secondary | ICD-10-CM | POA: Diagnosis not present

## 2019-08-18 DIAGNOSIS — R82998 Other abnormal findings in urine: Secondary | ICD-10-CM | POA: Diagnosis not present

## 2019-08-18 DIAGNOSIS — E878 Other disorders of electrolyte and fluid balance, not elsewhere classified: Secondary | ICD-10-CM | POA: Diagnosis not present

## 2019-08-18 DIAGNOSIS — Z23 Encounter for immunization: Secondary | ICD-10-CM | POA: Diagnosis not present

## 2019-08-18 DIAGNOSIS — E440 Moderate protein-calorie malnutrition: Secondary | ICD-10-CM | POA: Diagnosis not present

## 2019-08-19 DIAGNOSIS — G629 Polyneuropathy, unspecified: Secondary | ICD-10-CM | POA: Diagnosis not present

## 2019-08-19 DIAGNOSIS — D631 Anemia in chronic kidney disease: Secondary | ICD-10-CM | POA: Diagnosis not present

## 2019-08-19 DIAGNOSIS — Z23 Encounter for immunization: Secondary | ICD-10-CM | POA: Diagnosis not present

## 2019-08-19 DIAGNOSIS — Z992 Dependence on renal dialysis: Secondary | ICD-10-CM | POA: Diagnosis not present

## 2019-08-19 DIAGNOSIS — R82998 Other abnormal findings in urine: Secondary | ICD-10-CM | POA: Diagnosis not present

## 2019-08-19 DIAGNOSIS — N2581 Secondary hyperparathyroidism of renal origin: Secondary | ICD-10-CM | POA: Diagnosis not present

## 2019-08-19 DIAGNOSIS — Z79899 Other long term (current) drug therapy: Secondary | ICD-10-CM | POA: Diagnosis not present

## 2019-08-19 DIAGNOSIS — D509 Iron deficiency anemia, unspecified: Secondary | ICD-10-CM | POA: Diagnosis not present

## 2019-08-19 DIAGNOSIS — R17 Unspecified jaundice: Secondary | ICD-10-CM | POA: Diagnosis not present

## 2019-08-19 DIAGNOSIS — E876 Hypokalemia: Secondary | ICD-10-CM | POA: Diagnosis not present

## 2019-08-19 DIAGNOSIS — M5412 Radiculopathy, cervical region: Secondary | ICD-10-CM | POA: Diagnosis not present

## 2019-08-19 DIAGNOSIS — E440 Moderate protein-calorie malnutrition: Secondary | ICD-10-CM | POA: Diagnosis not present

## 2019-08-19 DIAGNOSIS — E878 Other disorders of electrolyte and fluid balance, not elsewhere classified: Secondary | ICD-10-CM | POA: Diagnosis not present

## 2019-08-19 DIAGNOSIS — N186 End stage renal disease: Secondary | ICD-10-CM | POA: Diagnosis not present

## 2019-08-20 DIAGNOSIS — E878 Other disorders of electrolyte and fluid balance, not elsewhere classified: Secondary | ICD-10-CM | POA: Diagnosis not present

## 2019-08-20 DIAGNOSIS — D631 Anemia in chronic kidney disease: Secondary | ICD-10-CM | POA: Diagnosis not present

## 2019-08-20 DIAGNOSIS — D509 Iron deficiency anemia, unspecified: Secondary | ICD-10-CM | POA: Diagnosis not present

## 2019-08-20 DIAGNOSIS — E440 Moderate protein-calorie malnutrition: Secondary | ICD-10-CM | POA: Diagnosis not present

## 2019-08-20 DIAGNOSIS — Z23 Encounter for immunization: Secondary | ICD-10-CM | POA: Diagnosis not present

## 2019-08-20 DIAGNOSIS — E876 Hypokalemia: Secondary | ICD-10-CM | POA: Diagnosis not present

## 2019-08-20 DIAGNOSIS — Z79899 Other long term (current) drug therapy: Secondary | ICD-10-CM | POA: Diagnosis not present

## 2019-08-20 DIAGNOSIS — E1122 Type 2 diabetes mellitus with diabetic chronic kidney disease: Secondary | ICD-10-CM | POA: Diagnosis not present

## 2019-08-20 DIAGNOSIS — R17 Unspecified jaundice: Secondary | ICD-10-CM | POA: Diagnosis not present

## 2019-08-20 DIAGNOSIS — R82998 Other abnormal findings in urine: Secondary | ICD-10-CM | POA: Diagnosis not present

## 2019-08-20 DIAGNOSIS — N2581 Secondary hyperparathyroidism of renal origin: Secondary | ICD-10-CM | POA: Diagnosis not present

## 2019-08-20 DIAGNOSIS — N186 End stage renal disease: Secondary | ICD-10-CM | POA: Diagnosis not present

## 2019-08-20 DIAGNOSIS — Z992 Dependence on renal dialysis: Secondary | ICD-10-CM | POA: Diagnosis not present

## 2019-08-21 DIAGNOSIS — E119 Type 2 diabetes mellitus without complications: Secondary | ICD-10-CM | POA: Diagnosis not present

## 2019-08-21 DIAGNOSIS — R82998 Other abnormal findings in urine: Secondary | ICD-10-CM | POA: Diagnosis not present

## 2019-08-21 DIAGNOSIS — N186 End stage renal disease: Secondary | ICD-10-CM | POA: Diagnosis not present

## 2019-08-21 DIAGNOSIS — Z23 Encounter for immunization: Secondary | ICD-10-CM | POA: Diagnosis not present

## 2019-08-21 DIAGNOSIS — Z992 Dependence on renal dialysis: Secondary | ICD-10-CM | POA: Diagnosis not present

## 2019-08-21 DIAGNOSIS — E876 Hypokalemia: Secondary | ICD-10-CM | POA: Diagnosis not present

## 2019-08-21 DIAGNOSIS — N2589 Other disorders resulting from impaired renal tubular function: Secondary | ICD-10-CM | POA: Diagnosis not present

## 2019-08-21 DIAGNOSIS — Z4932 Encounter for adequacy testing for peritoneal dialysis: Secondary | ICD-10-CM | POA: Diagnosis not present

## 2019-08-21 DIAGNOSIS — N2581 Secondary hyperparathyroidism of renal origin: Secondary | ICD-10-CM | POA: Diagnosis not present

## 2019-08-21 DIAGNOSIS — D631 Anemia in chronic kidney disease: Secondary | ICD-10-CM | POA: Diagnosis not present

## 2019-08-21 DIAGNOSIS — D509 Iron deficiency anemia, unspecified: Secondary | ICD-10-CM | POA: Diagnosis not present

## 2019-08-22 DIAGNOSIS — E876 Hypokalemia: Secondary | ICD-10-CM | POA: Diagnosis not present

## 2019-08-22 DIAGNOSIS — D631 Anemia in chronic kidney disease: Secondary | ICD-10-CM | POA: Diagnosis not present

## 2019-08-22 DIAGNOSIS — D509 Iron deficiency anemia, unspecified: Secondary | ICD-10-CM | POA: Diagnosis not present

## 2019-08-22 DIAGNOSIS — Z4932 Encounter for adequacy testing for peritoneal dialysis: Secondary | ICD-10-CM | POA: Diagnosis not present

## 2019-08-22 DIAGNOSIS — N2581 Secondary hyperparathyroidism of renal origin: Secondary | ICD-10-CM | POA: Diagnosis not present

## 2019-08-22 DIAGNOSIS — Z23 Encounter for immunization: Secondary | ICD-10-CM | POA: Diagnosis not present

## 2019-08-22 DIAGNOSIS — Z992 Dependence on renal dialysis: Secondary | ICD-10-CM | POA: Diagnosis not present

## 2019-08-22 DIAGNOSIS — R82998 Other abnormal findings in urine: Secondary | ICD-10-CM | POA: Diagnosis not present

## 2019-08-22 DIAGNOSIS — E119 Type 2 diabetes mellitus without complications: Secondary | ICD-10-CM | POA: Diagnosis not present

## 2019-08-22 DIAGNOSIS — N2589 Other disorders resulting from impaired renal tubular function: Secondary | ICD-10-CM | POA: Diagnosis not present

## 2019-08-22 DIAGNOSIS — N186 End stage renal disease: Secondary | ICD-10-CM | POA: Diagnosis not present

## 2019-08-23 DIAGNOSIS — E876 Hypokalemia: Secondary | ICD-10-CM | POA: Diagnosis not present

## 2019-08-23 DIAGNOSIS — Z23 Encounter for immunization: Secondary | ICD-10-CM | POA: Diagnosis not present

## 2019-08-23 DIAGNOSIS — Z4932 Encounter for adequacy testing for peritoneal dialysis: Secondary | ICD-10-CM | POA: Diagnosis not present

## 2019-08-23 DIAGNOSIS — E119 Type 2 diabetes mellitus without complications: Secondary | ICD-10-CM | POA: Diagnosis not present

## 2019-08-23 DIAGNOSIS — Z992 Dependence on renal dialysis: Secondary | ICD-10-CM | POA: Diagnosis not present

## 2019-08-23 DIAGNOSIS — N186 End stage renal disease: Secondary | ICD-10-CM | POA: Diagnosis not present

## 2019-08-23 DIAGNOSIS — N2589 Other disorders resulting from impaired renal tubular function: Secondary | ICD-10-CM | POA: Diagnosis not present

## 2019-08-23 DIAGNOSIS — D631 Anemia in chronic kidney disease: Secondary | ICD-10-CM | POA: Diagnosis not present

## 2019-08-23 DIAGNOSIS — D509 Iron deficiency anemia, unspecified: Secondary | ICD-10-CM | POA: Diagnosis not present

## 2019-08-23 DIAGNOSIS — R82998 Other abnormal findings in urine: Secondary | ICD-10-CM | POA: Diagnosis not present

## 2019-08-23 DIAGNOSIS — N2581 Secondary hyperparathyroidism of renal origin: Secondary | ICD-10-CM | POA: Diagnosis not present

## 2019-08-24 DIAGNOSIS — Z4932 Encounter for adequacy testing for peritoneal dialysis: Secondary | ICD-10-CM | POA: Diagnosis not present

## 2019-08-24 DIAGNOSIS — N2589 Other disorders resulting from impaired renal tubular function: Secondary | ICD-10-CM | POA: Diagnosis not present

## 2019-08-24 DIAGNOSIS — E119 Type 2 diabetes mellitus without complications: Secondary | ICD-10-CM | POA: Diagnosis not present

## 2019-08-24 DIAGNOSIS — Z992 Dependence on renal dialysis: Secondary | ICD-10-CM | POA: Diagnosis not present

## 2019-08-24 DIAGNOSIS — Z23 Encounter for immunization: Secondary | ICD-10-CM | POA: Diagnosis not present

## 2019-08-24 DIAGNOSIS — E876 Hypokalemia: Secondary | ICD-10-CM | POA: Diagnosis not present

## 2019-08-24 DIAGNOSIS — R82998 Other abnormal findings in urine: Secondary | ICD-10-CM | POA: Diagnosis not present

## 2019-08-24 DIAGNOSIS — D631 Anemia in chronic kidney disease: Secondary | ICD-10-CM | POA: Diagnosis not present

## 2019-08-24 DIAGNOSIS — D509 Iron deficiency anemia, unspecified: Secondary | ICD-10-CM | POA: Diagnosis not present

## 2019-08-24 DIAGNOSIS — N186 End stage renal disease: Secondary | ICD-10-CM | POA: Diagnosis not present

## 2019-08-24 DIAGNOSIS — N2581 Secondary hyperparathyroidism of renal origin: Secondary | ICD-10-CM | POA: Diagnosis not present

## 2019-08-25 DIAGNOSIS — Z992 Dependence on renal dialysis: Secondary | ICD-10-CM | POA: Diagnosis not present

## 2019-08-25 DIAGNOSIS — R82998 Other abnormal findings in urine: Secondary | ICD-10-CM | POA: Diagnosis not present

## 2019-08-25 DIAGNOSIS — D509 Iron deficiency anemia, unspecified: Secondary | ICD-10-CM | POA: Diagnosis not present

## 2019-08-25 DIAGNOSIS — E119 Type 2 diabetes mellitus without complications: Secondary | ICD-10-CM | POA: Diagnosis not present

## 2019-08-25 DIAGNOSIS — Z23 Encounter for immunization: Secondary | ICD-10-CM | POA: Diagnosis not present

## 2019-08-25 DIAGNOSIS — E876 Hypokalemia: Secondary | ICD-10-CM | POA: Diagnosis not present

## 2019-08-25 DIAGNOSIS — N186 End stage renal disease: Secondary | ICD-10-CM | POA: Diagnosis not present

## 2019-08-25 DIAGNOSIS — N2589 Other disorders resulting from impaired renal tubular function: Secondary | ICD-10-CM | POA: Diagnosis not present

## 2019-08-25 DIAGNOSIS — Z4932 Encounter for adequacy testing for peritoneal dialysis: Secondary | ICD-10-CM | POA: Diagnosis not present

## 2019-08-25 DIAGNOSIS — N2581 Secondary hyperparathyroidism of renal origin: Secondary | ICD-10-CM | POA: Diagnosis not present

## 2019-08-25 DIAGNOSIS — D631 Anemia in chronic kidney disease: Secondary | ICD-10-CM | POA: Diagnosis not present

## 2019-08-26 DIAGNOSIS — Z4932 Encounter for adequacy testing for peritoneal dialysis: Secondary | ICD-10-CM | POA: Diagnosis not present

## 2019-08-26 DIAGNOSIS — Z1159 Encounter for screening for other viral diseases: Secondary | ICD-10-CM | POA: Diagnosis not present

## 2019-08-26 DIAGNOSIS — E119 Type 2 diabetes mellitus without complications: Secondary | ICD-10-CM | POA: Diagnosis not present

## 2019-08-26 DIAGNOSIS — Z992 Dependence on renal dialysis: Secondary | ICD-10-CM | POA: Diagnosis not present

## 2019-08-26 DIAGNOSIS — N2589 Other disorders resulting from impaired renal tubular function: Secondary | ICD-10-CM | POA: Diagnosis not present

## 2019-08-26 DIAGNOSIS — N186 End stage renal disease: Secondary | ICD-10-CM | POA: Diagnosis not present

## 2019-08-26 DIAGNOSIS — R82998 Other abnormal findings in urine: Secondary | ICD-10-CM | POA: Diagnosis not present

## 2019-08-26 DIAGNOSIS — D509 Iron deficiency anemia, unspecified: Secondary | ICD-10-CM | POA: Diagnosis not present

## 2019-08-26 DIAGNOSIS — N2581 Secondary hyperparathyroidism of renal origin: Secondary | ICD-10-CM | POA: Diagnosis not present

## 2019-08-26 DIAGNOSIS — E876 Hypokalemia: Secondary | ICD-10-CM | POA: Diagnosis not present

## 2019-08-26 DIAGNOSIS — D631 Anemia in chronic kidney disease: Secondary | ICD-10-CM | POA: Diagnosis not present

## 2019-08-26 DIAGNOSIS — Z23 Encounter for immunization: Secondary | ICD-10-CM | POA: Diagnosis not present

## 2019-08-27 DIAGNOSIS — E119 Type 2 diabetes mellitus without complications: Secondary | ICD-10-CM | POA: Diagnosis not present

## 2019-08-27 DIAGNOSIS — D509 Iron deficiency anemia, unspecified: Secondary | ICD-10-CM | POA: Diagnosis not present

## 2019-08-27 DIAGNOSIS — Z992 Dependence on renal dialysis: Secondary | ICD-10-CM | POA: Diagnosis not present

## 2019-08-27 DIAGNOSIS — D631 Anemia in chronic kidney disease: Secondary | ICD-10-CM | POA: Diagnosis not present

## 2019-08-27 DIAGNOSIS — N2581 Secondary hyperparathyroidism of renal origin: Secondary | ICD-10-CM | POA: Diagnosis not present

## 2019-08-27 DIAGNOSIS — N186 End stage renal disease: Secondary | ICD-10-CM | POA: Diagnosis not present

## 2019-08-27 DIAGNOSIS — E876 Hypokalemia: Secondary | ICD-10-CM | POA: Diagnosis not present

## 2019-08-27 DIAGNOSIS — Z4932 Encounter for adequacy testing for peritoneal dialysis: Secondary | ICD-10-CM | POA: Diagnosis not present

## 2019-08-27 DIAGNOSIS — N2589 Other disorders resulting from impaired renal tubular function: Secondary | ICD-10-CM | POA: Diagnosis not present

## 2019-08-27 DIAGNOSIS — R82998 Other abnormal findings in urine: Secondary | ICD-10-CM | POA: Diagnosis not present

## 2019-08-27 DIAGNOSIS — Z23 Encounter for immunization: Secondary | ICD-10-CM | POA: Diagnosis not present

## 2019-08-28 ENCOUNTER — Emergency Department (HOSPITAL_COMMUNITY): Payer: Medicare Other

## 2019-08-28 ENCOUNTER — Inpatient Hospital Stay (HOSPITAL_COMMUNITY)
Admission: EM | Admit: 2019-08-28 | Discharge: 2019-09-06 | DRG: 388 | Disposition: A | Discharge status: Home Health | Payer: Medicare Other | Attending: Internal Medicine | Admitting: Internal Medicine

## 2019-08-28 DIAGNOSIS — Z833 Family history of diabetes mellitus: Secondary | ICD-10-CM

## 2019-08-28 DIAGNOSIS — Z809 Family history of malignant neoplasm, unspecified: Secondary | ICD-10-CM

## 2019-08-28 DIAGNOSIS — R1110 Vomiting, unspecified: Secondary | ICD-10-CM | POA: Diagnosis not present

## 2019-08-28 DIAGNOSIS — Z96653 Presence of artificial knee joint, bilateral: Secondary | ICD-10-CM

## 2019-08-28 DIAGNOSIS — D131 Benign neoplasm of stomach: Secondary | ICD-10-CM | POA: Diagnosis not present

## 2019-08-28 DIAGNOSIS — E860 Dehydration: Secondary | ICD-10-CM | POA: Diagnosis present

## 2019-08-28 DIAGNOSIS — E1122 Type 2 diabetes mellitus with diabetic chronic kidney disease: Secondary | ICD-10-CM | POA: Diagnosis present

## 2019-08-28 DIAGNOSIS — D72829 Elevated white blood cell count, unspecified: Secondary | ICD-10-CM

## 2019-08-28 DIAGNOSIS — K2950 Unspecified chronic gastritis without bleeding: Secondary | ICD-10-CM | POA: Diagnosis not present

## 2019-08-28 DIAGNOSIS — K2970 Gastritis, unspecified, without bleeding: Secondary | ICD-10-CM | POA: Diagnosis not present

## 2019-08-28 DIAGNOSIS — R531 Weakness: Secondary | ICD-10-CM | POA: Diagnosis not present

## 2019-08-28 DIAGNOSIS — E213 Hyperparathyroidism, unspecified: Secondary | ICD-10-CM | POA: Diagnosis present

## 2019-08-28 DIAGNOSIS — Z8249 Family history of ischemic heart disease and other diseases of the circulatory system: Secondary | ICD-10-CM

## 2019-08-28 DIAGNOSIS — N179 Acute kidney failure, unspecified: Secondary | ICD-10-CM | POA: Diagnosis present

## 2019-08-28 DIAGNOSIS — M898X9 Other specified disorders of bone, unspecified site: Secondary | ICD-10-CM | POA: Diagnosis present

## 2019-08-28 DIAGNOSIS — K5651 Intestinal adhesions [bands], with partial obstruction: Secondary | ICD-10-CM | POA: Diagnosis not present

## 2019-08-28 DIAGNOSIS — Z4932 Encounter for adequacy testing for peritoneal dialysis: Secondary | ICD-10-CM | POA: Diagnosis not present

## 2019-08-28 DIAGNOSIS — D3A092 Benign carcinoid tumor of the stomach: Secondary | ICD-10-CM | POA: Diagnosis not present

## 2019-08-28 DIAGNOSIS — R001 Bradycardia, unspecified: Secondary | ICD-10-CM | POA: Diagnosis not present

## 2019-08-28 DIAGNOSIS — E119 Type 2 diabetes mellitus without complications: Secondary | ICD-10-CM | POA: Diagnosis not present

## 2019-08-28 DIAGNOSIS — Z23 Encounter for immunization: Secondary | ICD-10-CM | POA: Diagnosis not present

## 2019-08-28 DIAGNOSIS — N2581 Secondary hyperparathyroidism of renal origin: Secondary | ICD-10-CM | POA: Diagnosis present

## 2019-08-28 DIAGNOSIS — K5790 Diverticulosis of intestine, part unspecified, without perforation or abscess without bleeding: Secondary | ICD-10-CM | POA: Diagnosis present

## 2019-08-28 DIAGNOSIS — Z992 Dependence on renal dialysis: Secondary | ICD-10-CM

## 2019-08-28 DIAGNOSIS — E785 Hyperlipidemia, unspecified: Secondary | ICD-10-CM | POA: Diagnosis present

## 2019-08-28 DIAGNOSIS — I2510 Atherosclerotic heart disease of native coronary artery without angina pectoris: Secondary | ICD-10-CM | POA: Diagnosis not present

## 2019-08-28 DIAGNOSIS — I10 Essential (primary) hypertension: Secondary | ICD-10-CM | POA: Diagnosis present

## 2019-08-28 DIAGNOSIS — R110 Nausea: Secondary | ICD-10-CM | POA: Diagnosis not present

## 2019-08-28 DIAGNOSIS — E43 Unspecified severe protein-calorie malnutrition: Secondary | ICD-10-CM | POA: Diagnosis not present

## 2019-08-28 DIAGNOSIS — Z20828 Contact with and (suspected) exposure to other viral communicable diseases: Secondary | ICD-10-CM | POA: Diagnosis present

## 2019-08-28 DIAGNOSIS — E876 Hypokalemia: Secondary | ICD-10-CM | POA: Diagnosis not present

## 2019-08-28 DIAGNOSIS — K222 Esophageal obstruction: Secondary | ICD-10-CM | POA: Diagnosis present

## 2019-08-28 DIAGNOSIS — D631 Anemia in chronic kidney disease: Secondary | ICD-10-CM | POA: Diagnosis not present

## 2019-08-28 DIAGNOSIS — Z6824 Body mass index (BMI) 24.0-24.9, adult: Secondary | ICD-10-CM

## 2019-08-28 DIAGNOSIS — Z9049 Acquired absence of other specified parts of digestive tract: Secondary | ICD-10-CM

## 2019-08-28 DIAGNOSIS — M1990 Unspecified osteoarthritis, unspecified site: Secondary | ICD-10-CM | POA: Diagnosis present

## 2019-08-28 DIAGNOSIS — M109 Gout, unspecified: Secondary | ICD-10-CM | POA: Diagnosis present

## 2019-08-28 DIAGNOSIS — E1165 Type 2 diabetes mellitus with hyperglycemia: Secondary | ICD-10-CM | POA: Diagnosis present

## 2019-08-28 DIAGNOSIS — R112 Nausea with vomiting, unspecified: Secondary | ICD-10-CM | POA: Diagnosis not present

## 2019-08-28 DIAGNOSIS — N186 End stage renal disease: Secondary | ICD-10-CM | POA: Diagnosis not present

## 2019-08-28 DIAGNOSIS — K5730 Diverticulosis of large intestine without perforation or abscess without bleeding: Secondary | ICD-10-CM | POA: Diagnosis present

## 2019-08-28 DIAGNOSIS — R911 Solitary pulmonary nodule: Secondary | ICD-10-CM | POA: Diagnosis not present

## 2019-08-28 DIAGNOSIS — R933 Abnormal findings on diagnostic imaging of other parts of digestive tract: Secondary | ICD-10-CM | POA: Diagnosis not present

## 2019-08-28 DIAGNOSIS — Z79899 Other long term (current) drug therapy: Secondary | ICD-10-CM

## 2019-08-28 DIAGNOSIS — I959 Hypotension, unspecified: Secondary | ICD-10-CM | POA: Diagnosis not present

## 2019-08-28 DIAGNOSIS — Z7982 Long term (current) use of aspirin: Secondary | ICD-10-CM

## 2019-08-28 DIAGNOSIS — R1111 Vomiting without nausea: Secondary | ICD-10-CM | POA: Diagnosis not present

## 2019-08-28 DIAGNOSIS — K56609 Unspecified intestinal obstruction, unspecified as to partial versus complete obstruction: Secondary | ICD-10-CM | POA: Diagnosis not present

## 2019-08-28 DIAGNOSIS — D3A00 Benign carcinoid tumor of unspecified site: Secondary | ICD-10-CM | POA: Diagnosis not present

## 2019-08-28 DIAGNOSIS — K449 Diaphragmatic hernia without obstruction or gangrene: Secondary | ICD-10-CM | POA: Diagnosis not present

## 2019-08-28 DIAGNOSIS — K208 Other esophagitis without bleeding: Secondary | ICD-10-CM

## 2019-08-28 DIAGNOSIS — K56600 Partial intestinal obstruction, unspecified as to cause: Secondary | ICD-10-CM | POA: Diagnosis not present

## 2019-08-28 DIAGNOSIS — Z888 Allergy status to other drugs, medicaments and biological substances status: Secondary | ICD-10-CM

## 2019-08-28 DIAGNOSIS — Z96641 Presence of right artificial hip joint: Secondary | ICD-10-CM

## 2019-08-28 DIAGNOSIS — D509 Iron deficiency anemia, unspecified: Secondary | ICD-10-CM | POA: Diagnosis not present

## 2019-08-28 DIAGNOSIS — Z4901 Encounter for fitting and adjustment of extracorporeal dialysis catheter: Secondary | ICD-10-CM | POA: Diagnosis not present

## 2019-08-28 DIAGNOSIS — K2100 Gastro-esophageal reflux disease with esophagitis, without bleeding: Secondary | ICD-10-CM | POA: Diagnosis present

## 2019-08-28 DIAGNOSIS — M8580 Other specified disorders of bone density and structure, unspecified site: Secondary | ICD-10-CM | POA: Diagnosis present

## 2019-08-28 DIAGNOSIS — N281 Cyst of kidney, acquired: Secondary | ICD-10-CM | POA: Diagnosis not present

## 2019-08-28 DIAGNOSIS — I120 Hypertensive chronic kidney disease with stage 5 chronic kidney disease or end stage renal disease: Secondary | ICD-10-CM | POA: Diagnosis not present

## 2019-08-28 DIAGNOSIS — R82998 Other abnormal findings in urine: Secondary | ICD-10-CM | POA: Diagnosis not present

## 2019-08-28 DIAGNOSIS — N2589 Other disorders resulting from impaired renal tubular function: Secondary | ICD-10-CM | POA: Diagnosis not present

## 2019-08-28 DIAGNOSIS — K259 Gastric ulcer, unspecified as acute or chronic, without hemorrhage or perforation: Secondary | ICD-10-CM | POA: Diagnosis not present

## 2019-08-28 HISTORY — DX: Unspecified intestinal obstruction, unspecified as to partial versus complete obstruction: K56.609

## 2019-08-28 LAB — COMPREHENSIVE METABOLIC PANEL
ALT: 14 U/L (ref 0–44)
AST: 13 U/L — ABNORMAL LOW (ref 15–41)
Albumin: 2.6 g/dL — ABNORMAL LOW (ref 3.5–5.0)
Alkaline Phosphatase: 93 U/L (ref 38–126)
Anion gap: 25 — ABNORMAL HIGH (ref 5–15)
BUN: 77 mg/dL — ABNORMAL HIGH (ref 8–23)
CO2: 19 mmol/L — ABNORMAL LOW (ref 22–32)
Calcium: 10.7 mg/dL — ABNORMAL HIGH (ref 8.9–10.3)
Chloride: 89 mmol/L — ABNORMAL LOW (ref 98–111)
Creatinine, Ser: 9.55 mg/dL — ABNORMAL HIGH (ref 0.44–1.00)
GFR calc Af Amer: 4 mL/min — ABNORMAL LOW (ref >60)
GFR calc non Af Amer: 3 mL/min — ABNORMAL LOW (ref >60)
Glucose, Bld: 196 mg/dL — ABNORMAL HIGH (ref 70–99)
Potassium: 4 mmol/L (ref 3.5–5.1)
Sodium: 133 mmol/L — ABNORMAL LOW (ref 135–145)
Total Bilirubin: 0.9 mg/dL (ref 0.3–1.2)
Total Protein: 7.3 g/dL (ref 6.5–8.1)

## 2019-08-28 LAB — CBC WITH DIFFERENTIAL/PLATELET
Abs Immature Granulocytes: 0.14 10*3/uL — ABNORMAL HIGH (ref 0.00–0.07)
Basophils Absolute: 0 10*3/uL (ref 0.0–0.1)
Basophils Relative: 0 %
Eosinophils Absolute: 0 10*3/uL (ref 0.0–0.5)
Eosinophils Relative: 0 %
HCT: 32.4 % — ABNORMAL LOW (ref 36.0–46.0)
Hemoglobin: 10.1 g/dL — ABNORMAL LOW (ref 12.0–15.0)
Immature Granulocytes: 1 %
Lymphocytes Relative: 8 %
Lymphs Abs: 1.1 10*3/uL (ref 0.7–4.0)
MCH: 30.7 pg (ref 26.0–34.0)
MCHC: 31.2 g/dL (ref 30.0–36.0)
MCV: 98.5 fL (ref 80.0–100.0)
Monocytes Absolute: 0.5 10*3/uL (ref 0.1–1.0)
Monocytes Relative: 3 %
Neutro Abs: 11.5 10*3/uL — ABNORMAL HIGH (ref 1.7–7.7)
Neutrophils Relative %: 88 %
Platelets: 376 10*3/uL (ref 150–400)
RBC: 3.29 MIL/uL — ABNORMAL LOW (ref 3.87–5.11)
RDW: 15.1 % (ref 11.5–15.5)
WBC: 13.2 10*3/uL — ABNORMAL HIGH (ref 4.0–10.5)
nRBC: 0.2 % (ref 0.0–0.2)

## 2019-08-28 LAB — LIPASE, BLOOD: Lipase: 22 U/L (ref 11–51)

## 2019-08-28 LAB — BRAIN NATRIURETIC PEPTIDE: B Natriuretic Peptide: 90.6 pg/mL (ref 0.0–100.0)

## 2019-08-28 MED ORDER — HEPARIN SODIUM (PORCINE) 5000 UNIT/ML IJ SOLN
5000.0000 [IU] | Freq: Three times a day (TID) | INTRAMUSCULAR | Status: DC
  Administered 2019-08-29 (×2): 5000 [IU] via SUBCUTANEOUS
  Filled 2019-08-28: qty 1

## 2019-08-28 MED ORDER — ROSUVASTATIN CALCIUM 5 MG PO TABS: 5.0000 mg | ORAL_TABLET | Freq: Every day | ORAL | Status: DC

## 2019-08-28 MED ORDER — ASPIRIN 81 MG PO CHEW: 81.0000 mg | CHEWABLE_TABLET | Freq: Every day | ORAL | Status: DC

## 2019-08-28 MED ORDER — SENNOSIDES-DOCUSATE SODIUM 8.6-50 MG PO TABS: 1.0000 | ORAL_TABLET | Freq: Every evening | ORAL | Status: DC | PRN

## 2019-08-28 MED ORDER — PANTOPRAZOLE SODIUM 40 MG PO TBEC: 40.0000 mg | DELAYED_RELEASE_TABLET | Freq: Two times a day (BID) | ORAL | Status: DC

## 2019-08-28 MED ORDER — ONDANSETRON HCL 4 MG/2ML IJ SOLN
4.0000 mg | Freq: Once | INTRAMUSCULAR | Status: AC
  Administered 2019-08-28: 4 mg via INTRAVENOUS

## 2019-08-28 MED ORDER — TRAMADOL HCL 50 MG PO TABS
50.0000 mg | ORAL_TABLET | Freq: Four times a day (QID) | ORAL | Status: DC | PRN
  Administered 2019-08-31: 50 mg via ORAL
  Filled 2019-08-28: qty 1

## 2019-08-28 MED ORDER — SODIUM CHLORIDE 0.9 % IV SOLN
INTRAVENOUS | Status: DC
  Administered 2019-08-30: 02:00:00 via INTRAVENOUS

## 2019-08-28 MED ORDER — ACETAMINOPHEN 650 MG RE SUPP: 650.0000 mg | Freq: Four times a day (QID) | RECTAL | Status: DC | PRN

## 2019-08-28 MED ORDER — ACETAMINOPHEN 325 MG PO TABS: 650.0000 mg | ORAL_TABLET | Freq: Four times a day (QID) | ORAL | Status: DC | PRN

## 2019-08-28 MED ORDER — LABETALOL HCL 100 MG PO TABS: 200.0000 mg | ORAL_TABLET | Freq: Two times a day (BID) | ORAL | Status: DC

## 2019-08-28 MED ORDER — ONDANSETRON HCL 4 MG/2ML IJ SOLN
INTRAMUSCULAR | Status: AC
  Filled 2019-08-28: qty 2

## 2019-08-28 MED ORDER — CINACALCET HCL 30 MG PO TABS
30.0000 mg | ORAL_TABLET | Freq: Every day | ORAL | Status: DC
  Filled 2019-08-28: qty 1

## 2019-08-28 MED ORDER — HYDRALAZINE HCL 10 MG PO TABS: 10.0000 mg | ORAL_TABLET | Freq: Two times a day (BID) | ORAL | Status: DC

## 2019-08-28 MED ORDER — CALCITRIOL 0.5 MCG PO CAPS
0.5000 ug | ORAL_CAPSULE | Freq: Every day | ORAL | Status: DC
  Filled 2019-08-28: qty 1

## 2019-08-28 MED ORDER — SEVELAMER CARBONATE 800 MG PO TABS
800.0000 mg | ORAL_TABLET | Freq: Three times a day (TID) | ORAL | Status: DC
  Filled 2019-08-28 (×2): qty 1

## 2019-08-28 MED ORDER — SODIUM CHLORIDE 0.9 % IV SOLN
INTRAVENOUS | Status: DC
  Administered 2019-08-29: via INTRAVENOUS

## 2019-08-28 NOTE — H&P (Signed)
TRH H&P    Patient Demographics:    Eileen Perez, is a 78 y.o. female  MRN: [id number]  DOB - August 09, 1941  Admit Date - 08/28/2019  Referring MD/NP/PA: Vanita Panda  Outpatient Primary MD for the patient is Unk Pinto, MD  Patient coming from: Home  Chief complaint- vomiting    HPI:    Eileen Perez  is a 78 y.o. female, with history of thyroid disease, pancreatic cyst, hypertension, hyperlipidemia, GERD, diabetes mellitus, ESRD, presents to the ED today with a chief complaint of vomiting.  Patient reports that she has been vomiting for more than 3 days.  She only drinks liquids and does not eat at home for more than a week.  Still she continues to have multiple episodes of emesis each day.  Nonbloody nonbilious emesis.  She reports that before this she did have loose stools for several days.  She cannot quantify exact amount of days.  Loose stool was not watery, and nonbloody.  Patient reports that the symptoms are associated with lower abdominal pain.  It is lower abdomen and straight across.  Not on one side more than the other.  Patient does not make urine.  Patient has no vaginal bleeding.  Patient does report that she has been constipated.  Patient does report 2 abdominal surgeries in her history.  The first is a gallbladder, the second with a pancreatic cyst.  Of note patient does have ESRD with peritoneal dialysis.  She reports that her line was blocked today and she can get any of the fluid to go in or drain out and that also contributed to her coming to the ER.  ED course patient was diagnosed with a partial SBO.  Blood work showed a leukocytosis of 13.2, slight anemia with a hemoglobin of 10.1, hypochloremia with a chloride of 89, and low bicarb at 19.  Patient's creatinine is 9.55 and normal for her is 8.37 patient's BUN was 77 and normal for her is 42.  Patient hyperglycemia 196.  CT scan was done that  showed an SBO with no exact transition point.  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  Patient denies any knowledge of any carcinoid tumor, stomach cancer.  She does report that she was supposed to have an appointment with [facility] for third endoscope tomorrow where they are "removing polyps."  Likely this is actually collecting some kind of biopsies of her carcinoid tumor.    Review of systems:    In addition to the HPI above,  No Fever-chills, No Headache, No changes with Vision or hearing, No problems swallowing food or Liquids, No Chest pain, Cough or Shortness of Breath, Positive for abdominal pain, nausea, vomiting, and constipation. No Blood in stool or Urine, Does not make urine No new skin rashes or bruises, No new joints pains-aches,  No new weakness, tingling, numbness in any extremity, No recent weight gain or loss, No polyuria, polydypsia or polyphagia, No significant Mental Stressors.  All other systems reviewed and are negative.  Past History of the following :    Past Medical History:  Diagnosis Date  . Anemia   . Arthritis    Osteoarthritis  . Chronic kidney disease    Chronic renal insufficiency, peritoneal dialysis qday sees dr Moshe Cipro  . Diabetes mellitus    Pre  . Diverticulosis   . GERD (gastroesophageal reflux disease)   . Gout   . Hiatal hernia   . History of blood transfusion   . Hyperlipidemia   . Hypertension   . Osteopenia   . Pancreatic cyst 1999  . Thyroid disease    Hyperparathyroid       Past Surgical History:  Procedure Laterality Date  . BIOPSY  04/25/2019   Procedure: BIOPSY;  Surgeon: Otis Brace, MD;  Location: [facility];  Service: Gastroenterology;;  . CHOLECYSTECTOMY    . COLONOSCOPY  03/21/12   Next one in 2018  . COLONOSCOPY WITH PROPOFOL N/A 04/25/2019   Procedure: COLONOSCOPY WITH PROPOFOL;  Surgeon: Otis Brace, MD;  Location: [facility];  Service: Gastroenterology;  Laterality: N/A;  . ESOPHAGOGASTRODUODENOSCOPY (EGD) WITH PROPOFOL N/A 04/25/2019   Procedure: ESOPHAGOGASTRODUODENOSCOPY (EGD) WITH PROPOFOL;  Surgeon: Otis Brace, MD;  Location: [facility];  Service: Gastroenterology;  Laterality: N/A;  . EYE SURGERY Left    cataract extraction with IOL  . INSERTION OF DIALYSIS CATHETER N/A 02/15/2017   Procedure: REVISION OF DIALYSIS CATHETER;  Surgeon: Algernon Huxley, MD;  Location: [facility];  Service: Cardiovascular;  Laterality: N/A;  . JOINT REPLACEMENT  2012   left knee  . left knee replacement     . PANCREATIC CYST EXCISION  1999  . POLYPECTOMY  04/25/2019   Procedure: POLYPECTOMY;  Surgeon: Otis Brace, MD;  Location: [facility];  Service: Gastroenterology;;  . Clide Deutscher  04/25/2019   Procedure: SCLEROTHERAPY;  Surgeon: Otis Brace, MD;  Location: [facility];  Service: Gastroenterology;;  . TOTAL HIP ARTHROPLASTY Right 05/26/2015   Procedure: RIGHT TOTAL HIP ARTHROPLASTY ANTERIOR APPROACH;  Surgeon: Gaynelle Arabian, MD;  Location: [facility];  Service: Orthopedics;  Laterality: Right;  . TOTAL KNEE ARTHROPLASTY Right 06/09/2013   Procedure: RIGHT TOTAL KNEE ARTHROPLASTY;  Surgeon: Gearlean Alf, MD;  Location: [facility];  Service: Orthopedics;  Laterality: Right;      Social History:      Social History   Tobacco Use  . Smoking status: Never Smoker  . Smokeless tobacco: Never Used  Substance Use Topics  . Alcohol use: No    Alcohol/week: 0.0 standard drinks       Family History :     Family History  Problem Relation Age of Onset  . Cancer Father        Prostate and Throat  . Hypertension Mother   . Cancer Mother        Colon with METS  . Diabetes Mother       Home Medications:   Prior to Admission medications   Medication Sig Start Date End Date Taking? Authorizing Provider  allopurinol (ZYLOPRIM) 300 MG tablet TAKE 1/2 TO 1 TABLET BY  MOUTH DAILY AS DIRECTED Patient  taking differently: TAKE 1/2 TO 1 TABLET BY&nbsp;&nbsp;MOUTH DAILY AS DIRECTED 10/30/18   Liane Comber, NP  aspirin 81 MG tablet Take 81 mg by mouth at bedtime.     [provider]  b complex-vitamin c-folic acid (NEPHRO-VITE) 0.8 MG TABS tablet Take 1 tablet by mouth at bedtime.    [provider]  calcitRIOL (ROCALTROL) 0.5 MCG capsule Take 0.5 mcg by  mouth daily.     [provider]  cinacalcet (SENSIPAR) 30 MG tablet Take 30 mg by mouth daily.     [provider]  Flaxseed, Linseed, (FLAXSEED OIL) 1000 MG CAPS Take 1 capsule by mouth daily.     [provider]  hydrALAZINE (APRESOLINE) 10 MG tablet Take 1 tablet (10 mg total) by mouth 2 (two) times daily. NEEDS OFFICE VISIT FOR FURTHER REFILLS 07/15/19   Lendon Colonel, NP  labetalol (NORMODYNE) 200 MG tablet TAKE 1 TABLET BY MOUTH TWO  TIMES DAILY Patient taking differently: Take 200 mg by mouth 2 (two) times daily.  01/15/19   Unk Pinto, MD  Omega-3 Fatty Acids (FISH OIL) 1200 MG CAPS Take 1,200 mg by mouth daily.     [provider]  pantoprazole (PROTONIX) 40 MG tablet Take 1 tablet (40 mg total) by mouth 2 (two) times daily. 04/25/19 04/24/20  Otis Brace, MD  rosuvastatin (CRESTOR) 5 MG tablet Take 1 tablet (5 mg total) by mouth daily after supper. 06/25/19   Liane Comber, NP  sevelamer carbonate (RENVELA) 800 MG tablet Take 800 mg by mouth 3 (three) times daily with meals.     [provider]  ZINC GLUCONATE PO Take 40 mg by mouth daily.     [provider]     Allergies:     Allergies  Allergen Reactions  . Ace Inhibitors     unknown  . Nsaids     unknown     Physical Exam:   Vitals  Blood pressure (!) 115/58, pulse 73, temperature 97.7 F (36.5 C), temperature source Tympanic, resp. rate 20, height 5\' 1"  (1.549 m), weight 58.1 kg, SpO2 98 %.  1.  General: Alert resting supine in no acute distress  2. Psychiatric: Pleasant,  oriented x3  3. Neurologic: Cranial nerves II through XII are grossly intact with no focal deficits on limited exam  4. HEENMT:  Head is atraumatic normocephalic  5. Respiratory : Lungs are clear to auscultation bilaterally  6. Cardiovascular : Heart rate is normal rhythm is normal no murmur  7. Gastrointestinal:  Abdomen is soft nondistended and diffusely tender, the most intense tenderness is in the lower right and left quadrants  8. Skin:  Warm and dry  9.Musculoskeletal:  No pitting edema    Data Review:    CBC Recent Labs  Lab 08/28/19 1930  WBC 13.2*  HGB 10.1*  HCT 32.4*  PLT 376  MCV 98.5  MCH 30.7  MCHC 31.2  RDW 15.1  LYMPHSABS 1.1  MONOABS 0.5  EOSABS 0.0  BASOSABS 0.0   ------------------------------------------------------------------------------------------------------------------  Results for orders placed or performed during the hospital encounter of 08/28/19 (from the past 48 hour(s))  Comprehensive metabolic panel     Status: Abnormal   Collection Time: 08/28/19  7:30 PM  Result Value Ref Range   Sodium 133 (L) 135 - 145 mmol/L   Potassium 4.0 3.5 - 5.1 mmol/L   Chloride 89 (L) 98 - 111 mmol/L   CO2 19 (L) 22 - 32 mmol/L   Glucose, Bld 196 (H) 70 - 99 mg/dL   BUN 77 (H) 8 - 23 mg/dL   Creatinine, Ser 9.55 (H) 0.44 - 1.00 mg/dL   Calcium 10.7 (H) 8.9 - 10.3 mg/dL   Total Protein 7.3 6.5 - 8.1 g/dL   Albumin 2.6 (L) 3.5 - 5.0 g/dL   AST 13 (L) 15 - 41 U/L   ALT 14 0 - 44 U/L  Alkaline Phosphatase 93 38 - 126 U/L   Total Bilirubin 0.9 0.3 - 1.2 mg/dL   GFR calc non Af Amer 3 (L) >60 mL/min   GFR calc Af Amer 4 (L) >60 mL/min   Anion gap 25 (H) 5 - 15    Comment: RESULT CHECKED Performed at [facility] [address] [place], [place] [address]   Lipase, blood     Status: None   Collection Time: 08/28/19  7:30 PM  Result Value Ref Range   Lipase 22 11 - 51 U/L    Comment: Performed at [facility] [address], [place], [place] [address]  Brain natriuretic peptide     Status: None   Collection Time: 08/28/19  7:30 PM  Result Value Ref Range   B Natriuretic Peptide 90.6 0.0 - 100.0 pg/mL    Comment: Performed at [facility] [address] [place], [place] [address]  CBC with Differential     Status: Abnormal   Collection Time: 08/28/19  7:30 PM  Result Value Ref Range   WBC 13.2 (H) 4.0 - 10.5 K/uL   RBC 3.29 (L) 3.87 - 5.11 MIL/uL   Hemoglobin 10.1 (L) 12.0 - 15.0 g/dL   HCT 32.4 (L) 36.0 - 46.0 %   MCV 98.5 80.0 - 100.0 fL   MCH 30.7 26.0 - 34.0 pg   MCHC 31.2 30.0 - 36.0 g/dL   RDW 15.1 11.5 - 15.5 %   Platelets 376 150 - 400 K/uL   nRBC 0.2 0.0 - 0.2 %   Neutrophils Relative % 88 %   Neutro Abs 11.5 (H) 1.7 - 7.7 K/uL   Lymphocytes Relative 8 %   Lymphs Abs 1.1 0.7 - 4.0 K/uL   Monocytes Relative 3 %   Monocytes Absolute 0.5 0.1 - 1.0 K/uL   Eosinophils Relative 0 %   Eosinophils Absolute 0.0 0.0 - 0.5 K/uL   Basophils Relative 0 %   Basophils Absolute 0.0 0.0 - 0.1 K/uL   Immature Granulocytes 1 %   Abs Immature Granulocytes 0.14 (H) 0.00 - 0.07 K/uL    Comment: Performed at [facility], [address], [place], [place] [address]    Chemistries  Recent Labs  Lab 08/28/19 1930  NA 133*  K 4.0  CL 89*  CO2 19*  GLUCOSE 196*  BUN 77*  CREATININE 9.55*  CALCIUM 10.7*  AST 13*  ALT 14  ALKPHOS 93  BILITOT 0.9   ------------------------------------------------------------------------------------------------------------------  ------------------------------------------------------------------------------------------------------------------ GFR: Estimated Creatinine Clearance: 3.9 mL/min (A) (by C-G formula based on SCr of 9.55 mg/dL (H)). Liver Function Tests: Recent Labs  Lab 08/28/19 1930  AST 13*  ALT 14  ALKPHOS 93  BILITOT 0.9  PROT 7.3  ALBUMIN 2.6*   Recent Labs  Lab 08/28/19 1930  LIPASE 22   No results for input(s): AMMONIA in the  last 168 hours. Coagulation Profile: No results for input(s): INR, PROTIME in the last 168 hours. Cardiac Enzymes: No results for input(s): CKTOTAL, CKMB, CKMBINDEX, TROPONINI in the last 168 hours. BNP (last 3 results) No results for input(s): PROBNP in the last 8760 hours. HbA1C: No results for input(s): HGBA1C in the last 72 hours. CBG: No results for input(s): GLUCAP in the last 168 hours. Lipid Profile: No results for input(s): CHOL, HDL, LDLCALC, TRIG, CHOLHDL, LDLDIRECT in the last 72 hours. Thyroid Function Tests: No results for input(s): TSH, T4TOTAL, FREET4, T3FREE, THYROIDAB in the last 72 hours.  Anemia Panel: No results for input(s): VITAMINB12, FOLATE, FERRITIN, TIBC, IRON, RETICCTPCT in the last 72 hours.  --------------------------------------------------------------------------------------------------------------- Urine analysis:    Component Value Date/Time   COLORURINE YELLOW 03/25/2019 1548   APPEARANCEUR TURBID (A) 03/25/2019 1548   LABSPEC 1.007 03/25/2019 1548   PHURINE 7.0 03/25/2019 1548   GLUCOSEU NEGATIVE 03/25/2019 1548   HGBUR 2+ (A) 03/25/2019 1548   BILIRUBINUR NEGATIVE 12/08/2016 1115   KETONESUR NEGATIVE 03/25/2019 1548   PROTEINUR 2+ (A) 03/25/2019 1548   UROBILINOGEN 0.2 05/19/2015 0942   NITRITE NEGATIVE 03/25/2019 1548   LEUKOCYTESUR 3+ (A) 03/25/2019 1548      Imaging Results:    Ct Abdomen Pelvis Wo Contrast  Result Date: 08/28/2019 CLINICAL DATA:  Abdominal distension, pain, nausea and vomiting. Peritoneal dialysis. 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 EXAM: CT ABDOMEN AND PELVIS WITHOUT CONTRAST TECHNIQUE: Multidetector CT imaging of the abdomen and pelvis was performed following the standard protocol without IV contrast. COMPARISON:  05/20/2019 FINDINGS: Lower chest: The previously demonstrated 4 mm right middle lobe solid lung nodule measures 3 mm in maximum  diameter on image number 10 series 5. Hepatobiliary: No focal liver abnormality is seen. Status post cholecystectomy. No biliary dilatation. Pancreas: Stable marked atrophy of the tail and distal body of the pancreas. Spleen: Normal in size without focal abnormality. Adrenals/Urinary Tract: No significant change in bilateral adrenal hyperplasia. No significant change in multiple simple and complicated renal cysts in both kidneys. Poorly distended urinary bladder, obscured by streak artifacts from a right hip prosthesis. No visible ureteral abnormalities. Stomach/Bowel: Interval multiple moderately dilated loops of proximal small bowel with normal caliber or distal small bowel loops. The exact point of transition is difficult to determine. No obstructing mass visualized. Diffuse wall thickening in the antrum and pyloric regions of the stomach. This appears thicker and more heterogeneous posteriorly, measuring 2.2 cm in thickness. Multiple sigmoid colon diverticula. Vascular/Lymphatic: A previously demonstrated 10 mm short axis gastrohepatic ligament lymph node has a corresponding diameter of 8 mm on image number 14 series 3. No new or enlarging lymph nodes. Atheromatous arterial calcifications without aneurysm. Reproductive: Enlarged uterus containing multiple bulky, densely calcified fibroids. Other: The previously noted paraumbilical hernia containing fat has not changed significantly with other than some edema in the fat today. No air in the hernia today. The peritoneal dialysis catheter tip is in the inferior right pelvis, not well visualized due to the streak artifacts from the right hip prosthesis. Musculoskeletal: Right hip prosthesis with associated streak artifacts. Moderate left hip degenerative changes. Lumbar and lower thoracic spine degenerative changes and mild scoliosis. No evidence of bony metastatic disease. IMPRESSION: 1. Interval partial small bowel obstruction. The exact point of transition is  difficult to determine. This is most likely due to an adhesion. 2. 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 Correlation with the exact site of tumor at the time of endoscopic resection would be helpful. 3. Stable mildly enlarged gastrohepatic ligament lymph node suspicious for a possible metastatic node. 4. Sigmoid diverticulosis. 5. Stable paraumbilical hernia containing fat with other than some edema in the fat today. 6. Stable 4 mm right middle lobe solid lung nodule. 7. Stable bilateral simple and complicated renal cysts. 8. Stable uterine fibroids. 9. Stable marked atrophy of the tail and distal body of the pancreas. Electronically Signed   By: Claudie Revering M.D.   On:  08/28/2019 21:08    My personal review of EKG: Rhythm NSR, Rate 73/min, QTc 430,no Acute ST changes   Assessment & Plan:    Active Problems:   Small bowel obstruction ([place])   1. Small bowel obstruction 1. Likely secondary to adhesions from multiple abdominal surgeries 2. GEN surge consulted 3. N.p.o. 4. Will likely require NG tube but will defer to general surgery recommendations 5. Continue to monitor 2. ESRD with AKI 1. Patient has been 2 days without peritoneal dialysis 2. Consulting to nephro for morning recommendations 3. Avoid nephrotoxic agents when possible 4. Continue to monitor 3. Leukocytosis 1. No other signs of infection 2. Likely demargination 3. Continue to monitor 4. Carcinoid tumor 1. Followed by [facility] 2. Unclear if patient is aware of this diagnosis 5. Lymphadenopathy 1. Consider consult to GI versus oncology 2. This lymphadenopathy could indicate metastasis 6. Hyperglycemia 1. Patient does not take diabetic medications at home 2. Continue to monitor 3. Consider adding sliding scale if glucose remains high 7.    DVT Prophylaxis-Heparin and SCDs  AM Labs Ordered, also please review  Full Orders    Code Status: Full code  Admission status: Inpatient: Based on patients clinical presentation and evaluation of above clinical data, I have made determination that patient meets Inpatient criteria at this time.  Time spent in minutes : 68   Rolla Plate M.D on 08/28/2019 at 11:22 PM

## 2019-08-28 NOTE — Consult Note (Signed)
Reason for Consult:PSBO Referring Physician: [name] is an 78 y.o. female.  HPI: 78yo F with multiple medical problems including end-stage renal disease on peritoneal dialysis presents to the emergency department complaining of 3 days of nausea and vomiting.  She said decreased appetite.  No bowel movement in 3 days.  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  I was asked to see her in consultation.  The patient denies abdominal pain currently.  She has been unable to eat or drink much liquids at home over the past 3 days.  Past Medical History:  Diagnosis Date  . Anemia   . Arthritis    Osteoarthritis  . Chronic kidney disease    Chronic renal insufficiency, peritoneal dialysis qday sees dr Moshe Cipro  . Diabetes mellitus    Pre  . Diverticulosis   . GERD (gastroesophageal reflux disease)   . Gout   . Hiatal hernia   . History of blood transfusion   . Hyperlipidemia   . Hypertension   . Osteopenia   . Pancreatic cyst 1999  . Thyroid disease    Hyperparathyroid     Past Surgical History:  Procedure Laterality Date  . BIOPSY  04/25/2019   Procedure: BIOPSY;  Surgeon: Otis Brace, MD;  Location: [facility];  Service: Gastroenterology;;  . CHOLECYSTECTOMY    . COLONOSCOPY  03/21/12   Next one in 2018  . COLONOSCOPY WITH PROPOFOL N/A 04/25/2019   Procedure: COLONOSCOPY WITH PROPOFOL;  Surgeon: Otis Brace, MD;  Location: [facility];  Service: Gastroenterology;  Laterality: N/A;  . ESOPHAGOGASTRODUODENOSCOPY (EGD) WITH PROPOFOL N/A 04/25/2019   Procedure: ESOPHAGOGASTRODUODENOSCOPY (EGD) WITH PROPOFOL;  Surgeon: Otis Brace, MD;  Location: [facility];  Service: Gastroenterology;  Laterality: N/A;  . EYE SURGERY Left    cataract extraction with IOL  . INSERTION OF DIALYSIS CATHETER N/A 02/15/2017    Procedure: REVISION OF DIALYSIS CATHETER;  Surgeon: Algernon Huxley, MD;  Location: [facility];  Service: Cardiovascular;  Laterality: N/A;  . JOINT REPLACEMENT  2012   left knee  . left knee replacement     . PANCREATIC CYST EXCISION  1999  . POLYPECTOMY  04/25/2019   Procedure: POLYPECTOMY;  Surgeon: Otis Brace, MD;  Location: [facility];  Service: Gastroenterology;;  . Clide Deutscher  04/25/2019   Procedure: SCLEROTHERAPY;  Surgeon: Otis Brace, MD;  Location: [facility];  Service: Gastroenterology;;  . TOTAL HIP ARTHROPLASTY Right 05/26/2015   Procedure: RIGHT TOTAL HIP ARTHROPLASTY ANTERIOR APPROACH;  Surgeon: Gaynelle Arabian, MD;  Location: [facility];  Service: Orthopedics;  Laterality: Right;  . TOTAL KNEE ARTHROPLASTY Right 06/09/2013   Procedure: RIGHT TOTAL KNEE ARTHROPLASTY;  Surgeon: Gearlean Alf, MD;  Location: [facility];  Service: Orthopedics;  Laterality: Right;    Family History  Problem Relation Age of Onset  . Cancer Father        Prostate and Throat  . Hypertension Mother   . Cancer Mother        Colon with METS  . Diabetes Mother     Social History:  reports that she has never smoked. She has never used smokeless tobacco. She reports that she does not drink alcohol or use drugs.  Allergies:  Allergies  Allergen Reactions  . Ace Inhibitors     unknown  . Nsaids     unknown    Medications:  I have reviewed the patient's current medications.  Results for orders placed or performed during the hospital encounter of 08/28/19 (from the past 48 hour(s))  Comprehensive metabolic panel     Status: Abnormal   Collection Time: 08/28/19  7:30 PM  Result Value Ref Range   Sodium 133 (L) 135 - 145 mmol/L   Potassium 4.0 3.5 - 5.1 mmol/L   Chloride 89 (L) 98 - 111 mmol/L   CO2 19 (L) 22 - 32 mmol/L   Glucose, Bld 196 (H) 70 - 99 mg/dL   BUN 77 (H) 8 - 23 mg/dL   Creatinine, Ser 9.55 (H) 0.44 - 1.00 mg/dL   Calcium 10.7 (H) 8.9 - 10.3 mg/dL   Total Protein 7.3 6.5 -  8.1 g/dL   Albumin 2.6 (L) 3.5 - 5.0 g/dL   AST 13 (L) 15 - 41 U/L   ALT 14 0 - 44 U/L   Alkaline Phosphatase 93 38 - 126 U/L   Total Bilirubin 0.9 0.3 - 1.2 mg/dL   GFR calc non Af Amer 3 (L) >60 mL/min   GFR calc Af Amer 4 (L) >60 mL/min   Anion gap 25 (H) 5 - 15    Comment: RESULT CHECKED Performed at [facility], [address] [place], [place] [address]   Lipase, blood     Status: None   Collection Time: 08/28/19  7:30 PM  Result Value Ref Range   Lipase 22 11 - 51 U/L    Comment: Performed at [facility] [address], [place], [place] [address]  Brain natriuretic peptide     Status: None   Collection Time: 08/28/19  7:30 PM  Result Value Ref Range   B Natriuretic Peptide 90.6 0.0 - 100.0 pg/mL    Comment: Performed at [facility] [address], [place], [place] [address]  CBC with Differential     Status: Abnormal   Collection Time: 08/28/19  7:30 PM  Result Value Ref Range   WBC 13.2 (H) 4.0 - 10.5 K/uL   RBC 3.29 (L) 3.87 - 5.11 MIL/uL   Hemoglobin 10.1 (L) 12.0 - 15.0 g/dL   HCT 32.4 (L) 36.0 - 46.0 %   MCV 98.5 80.0 - 100.0 fL   MCH 30.7 26.0 - 34.0 pg   MCHC 31.2 30.0 - 36.0 g/dL   RDW 15.1 11.5 - 15.5 %   Platelets 376 150 - 400 K/uL   nRBC 0.2 0.0 - 0.2 %   Neutrophils Relative % 88 %   Neutro Abs 11.5 (H) 1.7 - 7.7 K/uL   Lymphocytes Relative 8 %   Lymphs Abs 1.1 0.7 - 4.0 K/uL   Monocytes Relative 3 %   Monocytes Absolute 0.5 0.1 - 1.0 K/uL   Eosinophils Relative 0 %   Eosinophils Absolute 0.0 0.0 - 0.5 K/uL   Basophils Relative 0 %   Basophils Absolute 0.0 0.0 - 0.1 K/uL   Immature Granulocytes 1 %   Abs Immature Granulocytes 0.14 (H) 0.00 - 0.07 K/uL    Comment: Performed at [facility], [address] [place], [place] [address]    Ct Abdomen Pelvis Wo Contrast  Result Date: 08/28/2019 CLINICAL DATA:  Abdominal distension, pain, nausea and vomiting. Peritoneal dialysis. 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 EXAM: CT ABDOMEN AND PELVIS WITHOUT CONTRAST TECHNIQUE: Multidetector CT imaging of the abdomen and pelvis was performed following the standard protocol without  IV contrast. COMPARISON:  05/20/2019 FINDINGS: Lower chest: The previously demonstrated 4 mm right middle lobe solid lung nodule measures 3 mm in maximum diameter on image number 10 series 5. Hepatobiliary: No focal liver abnormality is seen. Status post cholecystectomy. No biliary dilatation. Pancreas: Stable marked atrophy of the tail and distal body of the pancreas. Spleen: Normal in size without focal abnormality. Adrenals/Urinary Tract: No significant change in bilateral adrenal hyperplasia. No significant change in multiple simple and complicated renal cysts in both kidneys. Poorly distended urinary bladder, obscured by streak artifacts from a right hip prosthesis. No visible ureteral abnormalities. Stomach/Bowel: Interval multiple moderately dilated loops of proximal small bowel with normal caliber or distal small bowel loops. The exact point of transition is difficult to determine. No obstructing mass visualized. Diffuse wall thickening in the antrum and pyloric regions of the stomach. This appears thicker and more heterogeneous posteriorly, measuring 2.2 cm in thickness. Multiple sigmoid colon diverticula. Vascular/Lymphatic: A previously demonstrated 10 mm short axis gastrohepatic ligament lymph node has a corresponding diameter of 8 mm on image number 14 series 3. No new or enlarging lymph nodes. Atheromatous arterial calcifications without aneurysm. Reproductive: Enlarged uterus containing multiple bulky, densely calcified fibroids. Other: The previously noted paraumbilical hernia containing fat has not changed significantly with other than some edema in the fat today. No air in the hernia today. The peritoneal dialysis catheter tip is in the inferior right pelvis, not well  visualized due to the streak artifacts from the right hip prosthesis. Musculoskeletal: Right hip prosthesis with associated streak artifacts. Moderate left hip degenerative changes. Lumbar and lower thoracic spine degenerative changes and mild scoliosis. No evidence of bony metastatic disease. IMPRESSION: 1. Interval partial small bowel obstruction. The exact point of transition is difficult to determine. This is most likely due to an adhesion. 2. 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 Correlation with the exact site of tumor at the time of endoscopic resection would be helpful. 3. Stable mildly enlarged gastrohepatic ligament lymph node suspicious for a possible metastatic node. 4. Sigmoid diverticulosis. 5. Stable paraumbilical hernia containing fat with other than some edema in the fat today. 6. Stable 4 mm right middle lobe solid lung nodule. 7. Stable bilateral simple and complicated renal cysts. 8. Stable uterine fibroids. 9. Stable marked atrophy of the tail and distal body of the pancreas. Electronically Signed   By: Claudie Revering M.D.   On: 08/28/2019 21:08    Review of Systems  Constitutional: Negative for chills and fever.  HENT: Negative.   Eyes: Negative.   Respiratory: Negative for cough.   Cardiovascular: Negative for chest pain.  Gastrointestinal: Positive for constipation, nausea and vomiting.  Genitourinary:       ESRD  Musculoskeletal: Negative.   Skin: Negative.   Neurological: Negative.   Endo/Heme/Allergies: Negative.   Psychiatric/Behavioral: Negative.    Blood pressure (!) 115/58, pulse 73, temperature 97.7 F (36.5 C), temperature source Tympanic, resp. rate 20, height 5\' 1"  (1.549 m), weight 58.1 kg, SpO2 98 %. Physical Exam  Constitutional: She is oriented to person, place, and time. She appears well-developed and well-nourished.  HENT:  Head: Normocephalic.   Right Ear: External ear normal.  Left Ear: External ear normal.  Mouth/Throat: Oropharynx is clear and moist.  Eyes: Pupils are equal, round, and reactive to light.  Neck: Neck supple.  Cardiovascular: Normal rate, regular rhythm and normal heart sounds.  Respiratory:  Effort normal and breath sounds normal. No respiratory distress. She has no wheezes.  GI: Soft. She exhibits no distension. There is no abdominal tenderness. There is no rebound and no guarding.  Hypoactive bowel sounds, CAPD catheter in place, no significant tenderness  Neurological: She is alert and oriented to person, place, and time.  Skin: Skin is warm and dry.  Psychiatric: She has a normal mood and affect.    Assessment/Plan: PSBO -agree with medical admission, IV fluids, bowel rest.  If she vomits, may need NG tube.  If she gets NG tube and we will consider small bowel obstruction protocol in a.m.  We will follow.  No need for emergent exploration.  History of gastric carcinoid  Eileen Perez 08/28/2019, 10:51 PM

## 2019-08-28 NOTE — ED Triage Notes (Signed)
Pt came via EMS from home with the c/o of generalized weakness for the past two days. N/V past 3 days with the last vomiting episode at 1600 today. Pt is on dialysis at home and has not received treatment the past two days d/t mechanical machine error. Pt is has a endo procedure scheduled for tomorrow. 20GIV LFA, BP:114/63. HR:80, 98%RA CBG:214.

## 2019-08-28 NOTE — ED Provider Notes (Addendum)
[facility] EMERGENCY DEPARTMENT Provider Note   CSN: [id number] Arrival date & time: 08/28/19  1858     History   Chief Complaint Chief Complaint  Patient presents with  . Weakness    HPI Eileen Perez is a 78 y.o. female.     HPI  Patient presents with concern of weakness, nausea, vomiting, abdominal pain. Weakness and nausea, vomiting have been present for possibly 1 week, beginning without a clear precipitant, persistent, with inability to tolerate oral or liquids, including all medication over the last 2 or 3 days. Pain is throughout the lower abdomen, sore, severe, nonradiating. She has been intolerant of medication for possible relief, and pain is worse with palpation. There is associated nausea, anorexia. Last bowel movement was 3 days ago. Notably, the patient is on peritoneal dialysis.  Her last session was 2 days ago, and each of the last 2 sessions was not started due to computer readout stating patient side air. Notably, the patient also has ongoing evaluation with GI, and tomorrow is scheduled for endoscopy. She notes that this will be her third anoscopy in the past 4 months due to dysphagia, consideration of upper GI bleed.  Past Medical History:  Diagnosis Date  . Anemia   . Arthritis    Osteoarthritis  . Chronic kidney disease    Chronic renal insufficiency, peritoneal dialysis qday sees dr Moshe Cipro  . Diabetes mellitus    Pre  . Diverticulosis   . GERD (gastroesophageal reflux disease)   . Gout   . Hiatal hernia   . History of blood transfusion   . Hyperlipidemia   . Hypertension   . Osteopenia   . Pancreatic cyst 1999  . Thyroid disease    Hyperparathyroid     Patient Active Problem List   Diagnosis Date Noted  . Schatzki's ring of distal esophagus 06/23/2019  . 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 History of colon polyps 06/23/2019  . Enrolled in chronic care management 02/20/2019  . Aortic atherosclerosis ([place])  02/13/2018  . 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 Overweight (BMI 25.0-29.9) 10/31/2015  . ESRD (end stage renal disease) ([place]) 10/31/2015  . OA (osteoarthritis) of hip 05/26/2015  . Secondary hyperparathyroidism ([facility]) 04/16/2014  . Vitamin D deficiency 04/16/2014  . Hyperlipidemia, mixed 04/16/2014  . Medication management 04/16/2014  . Anemia of chronic Renal Dz   . Pancreatic cyst   . GERD   . Gout   . Osteopenia   . Essential hypertension 06/10/2013  . DJD 06/09/2013    Past Surgical History:  Procedure Laterality Date  . BIOPSY  04/25/2019   Procedure: BIOPSY;  Surgeon: Otis Brace, MD;  Location: [facility];  Service: Gastroenterology;;  . CHOLECYSTECTOMY    . COLONOSCOPY  03/21/12   Next one in 2018  . COLONOSCOPY WITH PROPOFOL N/A 04/25/2019   Procedure: COLONOSCOPY WITH PROPOFOL;  Surgeon: Otis Brace, MD;  Location: [facility];  Service: Gastroenterology;  Laterality: N/A;  . ESOPHAGOGASTRODUODENOSCOPY (EGD) WITH PROPOFOL N/A 04/25/2019   Procedure: ESOPHAGOGASTRODUODENOSCOPY (EGD) WITH PROPOFOL;  Surgeon: Otis Brace, MD;  Location: [facility];  Service: Gastroenterology;  Laterality: N/A;  . EYE SURGERY Left    cataract extraction with IOL  . INSERTION OF DIALYSIS CATHETER N/A 02/15/2017   Procedure: REVISION OF DIALYSIS CATHETER;  Surgeon: Algernon Huxley, MD;  Location: [facility];  Service: Cardiovascular;  Laterality: N/A;  . JOINT REPLACEMENT  2012   left knee  . left knee replacement     .  PANCREATIC CYST EXCISION  1999  . POLYPECTOMY  04/25/2019   Procedure: POLYPECTOMY;  Surgeon: Otis Brace, MD;  Location: [facility];  Service: Gastroenterology;;  . Clide Deutscher  04/25/2019   Procedure: SCLEROTHERAPY;  Surgeon: Otis Brace, MD;  Location: [facility];  Service: Gastroenterology;;  . TOTAL HIP ARTHROPLASTY Right 05/26/2015   Procedure: RIGHT TOTAL HIP ARTHROPLASTY ANTERIOR APPROACH;  Surgeon: Gaynelle Arabian, MD;  Location: [facility];   Service: Orthopedics;  Laterality: Right;  . TOTAL KNEE ARTHROPLASTY Right 06/09/2013   Procedure: RIGHT TOTAL KNEE ARTHROPLASTY;  Surgeon: Gearlean Alf, MD;  Location: [facility];  Service: Orthopedics;  Laterality: Right;     OB History   No obstetric history on file.      Home Medications    Prior to Admission medications   Medication Sig Start Date End Date Taking? Authorizing Provider  allopurinol (ZYLOPRIM) 300 MG tablet TAKE 1/2 TO 1 TABLET BY  MOUTH DAILY AS DIRECTED Patient taking differently: TAKE 1/2 TO 1 TABLET BY  MOUTH DAILY AS DIRECTED 10/30/18   Liane Comber, NP  aspirin 81 MG tablet Take 81 mg by mouth at bedtime.     [provider]  b complex-vitamin c-folic acid (NEPHRO-VITE) 0.8 MG TABS tablet Take 1 tablet by mouth at bedtime.    [provider]  calcitRIOL (ROCALTROL) 0.5 MCG capsule Take 0.5 mcg by mouth daily.     [provider]  cinacalcet (SENSIPAR) 30 MG tablet Take 30 mg by mouth daily.     [provider]  Flaxseed, Linseed, (FLAXSEED OIL) 1000 MG CAPS Take 1 capsule by mouth daily.     [provider]  hydrALAZINE (APRESOLINE) 10 MG tablet Take 1 tablet (10 mg total) by mouth 2 (two) times daily. NEEDS OFFICE VISIT FOR FURTHER REFILLS 07/15/19   Lendon Colonel, NP  labetalol (NORMODYNE) 200 MG tablet TAKE 1 TABLET BY MOUTH TWO  TIMES DAILY Patient taking differently: Take 200 mg by mouth 2 (two) times daily.  01/15/19   Unk Pinto, MD  Omega-3 Fatty Acids (FISH OIL) 1200 MG CAPS Take 1,200 mg by mouth daily.     [provider]  pantoprazole (PROTONIX) 40 MG tablet Take 1 tablet (40 mg total) by mouth 2 (two) times daily. 04/25/19 04/24/20  Otis Brace, MD  rosuvastatin (CRESTOR) 5 MG tablet Take 1 tablet (5 mg total) by mouth daily after supper. 06/25/19   Liane Comber, NP  sevelamer carbonate (RENVELA) 800 MG tablet Take 800 mg by mouth 3 (three) times daily with meals.     [provider]  ZINC GLUCONATE PO Take 40 mg by mouth daily.     [provider]    Family History Family History  Problem Relation Age of Onset  . Cancer Father        Prostate and Throat  . Hypertension Mother   . Cancer Mother        Colon with METS  . Diabetes Mother     Social History Social History   Tobacco Use  . Smoking status: Never Smoker  . Smokeless tobacco: Never Used  Substance Use Topics  . Alcohol use: No    Alcohol/week: 0.0 standard drinks  . Drug use: No     Allergies   Ace inhibitors and Nsaids   Review of Systems Review of Systems  Constitutional:       Per HPI, otherwise negative  HENT:       Per HPI, otherwise negative  Respiratory:       Per HPI, otherwise negative  Cardiovascular:       Per HPI, otherwise negative  Gastrointestinal: Positive for abdominal pain, nausea and vomiting.  Endocrine:       Negative aside from HPI  Genitourinary:       Neg aside from HPI   Musculoskeletal:       Per HPI, otherwise negative  Skin: Negative.   Allergic/Immunologic: Positive for immunocompromised state.  Neurological: Positive for weakness. Negative for syncope.     Physical Exam Updated Vital Signs BP (!) 109/55   Pulse 74   Temp 97.7 F (36.5 C) (Tympanic)   Resp 18   Ht 5\' 1"  (1.549 m)   Wt 58.1 kg   SpO2 98%   BMI 24.19 kg/m   Physical Exam Vitals signs and nursing note reviewed.  Constitutional:      General: She is not in acute distress.    Appearance: She is well-developed.  HENT:     Head: Normocephalic and atraumatic.  Eyes:     Conjunctiva/sclera: Conjunctivae normal.  Cardiovascular:     Rate and Rhythm: Normal rate and regular rhythm.  Pulmonary:     Effort: Pulmonary effort is normal. No respiratory distress.     Breath sounds: Normal breath sounds. No stridor.  Abdominal:     Tenderness: There is abdominal tenderness. There is guarding.     Comments: TTP throughout the lower abd w guarding. LUQ  HD port unremarkable  Skin:    General: Skin is warm and dry.  Neurological:     Mental Status: She is alert and oriented to person, place, and time.     Cranial Nerves: No cranial nerve deficit.      ED Treatments / Results  Labs (all labs ordered are listed, but only abnormal results are displayed) Labs Reviewed  COMPREHENSIVE METABOLIC PANEL - Abnormal; Notable for the following components:      Result Value   Sodium 133 (*)    Chloride 89 (*)    CO2 19 (*)    Glucose, Bld 196 (*)    BUN 77 (*)    Creatinine, Ser 9.55 (*)    Calcium 10.7 (*)    Albumin 2.6 (*)    AST 13 (*)    GFR calc non Af Amer 3 (*)    GFR calc Af Amer 4 (*)    Anion gap 25 (*)    All other components within normal limits  CBC WITH DIFFERENTIAL/PLATELET - Abnormal; Notable for the following components:   WBC 13.2 (*)    RBC 3.29 (*)    Hemoglobin 10.1 (*)    HCT 32.4 (*)    Neutro Abs 11.5 (*)    Abs Immature Granulocytes 0.14 (*)    All other components within normal limits  SARS CORONAVIRUS 2 (TAT 6-24 HRS)  LIPASE, BLOOD  BRAIN NATRIURETIC PEPTIDE    EKG EKG Interpretation  Date/Time:  Thursday August 28 2019 19:09:03 EDT Ventricular Rate:  73 PR Interval:    QRS Duration: 88 QT Interval:  390 QTC Calculation: 430 R Axis:   -29 Text Interpretation:  Sinus rhythm Inferior infarct, old Lateral leads are also involved No significant change since last tracing Abnormal ECG Confirmed by Carmin Muskrat [phone]) on 08/28/2019 7:31:45 PM   Radiology Ct Abdomen Pelvis Wo Contrast  Result Date: 08/28/2019 CLINICAL DATA:  Abdominal distension, pain, nausea and vomiting. Peritoneal dialysis. 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 EXAM: CT ABDOMEN AND PELVIS WITHOUT CONTRAST TECHNIQUE: Multidetector CT imaging of the abdomen and pelvis was performed following the standard protocol without IV contrast. COMPARISON:   05/20/2019 FINDINGS: Lower chest: The previously demonstrated 4 mm right middle lobe solid lung nodule measures 3 mm in maximum diameter on image number 10 series 5. Hepatobiliary: No focal liver abnormality is seen. Status post cholecystectomy. No biliary dilatation. Pancreas: Stable marked atrophy of the tail and distal body of the pancreas. Spleen: Normal in size without focal abnormality. Adrenals/Urinary Tract: No significant change in bilateral adrenal hyperplasia. No significant change in multiple simple and complicated renal cysts in both kidneys. Poorly distended urinary bladder, obscured by streak artifacts from a right hip prosthesis. No visible ureteral abnormalities. Stomach/Bowel: Interval multiple moderately dilated loops of proximal small bowel with normal caliber or distal small bowel loops. The exact point of transition is difficult to determine. No obstructing mass visualized. Diffuse wall thickening in the antrum and pyloric regions of the stomach. This appears thicker and more heterogeneous posteriorly, measuring 2.2 cm in thickness. Multiple sigmoid colon diverticula. Vascular/Lymphatic: A previously demonstrated 10 mm short axis gastrohepatic ligament lymph node has a corresponding diameter of 8 mm on image number 14 series 3. No new or enlarging lymph nodes. Atheromatous arterial calcifications without aneurysm. Reproductive: Enlarged uterus containing multiple bulky, densely calcified fibroids. Other: The previously noted paraumbilical hernia containing fat has not changed significantly with other than some edema in the fat today. No air in the hernia today. The peritoneal dialysis catheter tip is in the inferior right pelvis, not well visualized due to the streak artifacts from the right hip prosthesis. Musculoskeletal: Right hip prosthesis with associated streak artifacts. Moderate left hip degenerative changes. Lumbar and lower thoracic spine degenerative changes and mild scoliosis. No  evidence of bony metastatic disease. IMPRESSION: 1. Interval partial small bowel obstruction. The exact point of transition is difficult to determine. This is most likely due to an adhesion. 2. 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 Correlation with the exact site of tumor at the time of endoscopic resection would be helpful. 3. Stable mildly enlarged gastrohepatic ligament lymph node suspicious for a possible metastatic node. 4. Sigmoid diverticulosis. 5. Stable paraumbilical hernia containing fat with other than some edema in the fat today. 6. Stable 4 mm right middle lobe solid lung nodule. 7. Stable bilateral simple and complicated renal cysts. 8. Stable uterine fibroids. 9. Stable marked atrophy of the tail and distal body of the pancreas. Electronically Signed   By: Claudie Revering M.D.   On: 08/28/2019 21:08    Procedures Procedures (including critical care time)  Medications Ordered in ED Medications  ondansetron (ZOFRAN) injection 4 mg (has no administration in time range)  ondansetron (ZOFRAN) 4 MG/2ML injection (has no administration in time range)     Initial Impression / Assessment and Plan / ED Course  I have reviewed the triage vital signs and the nursing notes.  Pertinent labs & imaging results that were available during my care of the patient were reviewed by me and considered in my medical decision making (see chart for details).        10:15 PM On repeat exam the patient is awake, alert. She has some mild nausea, Zofran pending. I have discussed the patient's CT results with our surgery colleagues, and with our internal medicine colleagues for admission given concern for  partial small bowel obstruction.  Patient herself is aware of all findings as well.  11:11 PM I d/w the patient's niece findings thus far  This elderly female presents with abdominal pain,  nausea, vomiting. This latter complaint is evident with attempted oral intake, but is not present at rest. Patient's history includes multiple medical issues including peritoneal dialysis. No evidence for substantial new electrolyte abnormalities, though she will likely need dialysis during this hospitalization. However, the patient is found to have a partial bowel obstruction requiring admission for further monitoring, management. Fluids, antiemetics provided.  Final Clinical Impressions(s) / ED Diagnoses   Final diagnoses:  Small bowel obstruction [facility])     Carmin Muskrat, MD 08/28/19 2221    Carmin Muskrat, MD 08/28/19 2313

## 2019-08-28 NOTE — ED Notes (Signed)
Patient Tammy copeland niece calling asking for an update on patient  Please call  [phone]

## 2019-08-28 NOTE — ED Notes (Signed)
ED TO INPATIENT HANDOFF REPORT  ED Nurse Name and Phone #:  [phone]  S Name/Age/Gender Eileen Perez 78 y.o. female Room/Bed: TRAAC/TRAAC  Code Status   Code Status: Full Code  Home/SNF/Other Home Patient oriented to: self, place, time and situation Is this baseline? Yes   Triage Complete: Triage complete  Chief Complaint weakness  Triage Note Pt came via EMS from home with the c/o of generalized weakness for the past two days. N/V past 3 days with the last vomiting episode at 1600 today. Pt is on dialysis at home and has not received treatment the past two days d/t mechanical machine error. Pt is has a endo procedure scheduled for tomorrow. 20GIV LFA, BP:114/63. HR:80, 98%RA CBG:214.     Allergies Allergies  Allergen Reactions  . Ace Inhibitors     unknown  . Nsaids     unknown    Level of Care/Admitting Diagnosis ED Disposition    ED Disposition Condition Comment   Admit  Hospital Area: [facility] [[id number]]  Level of Care: Med-Surg [16]  Covid Evaluation: Asymptomatic Screening Protocol (No Symptoms)  Diagnosis: Small bowel obstruction [facility]) [[id number]]  Admitting Physician: Rolla Plate [[id number]]  Attending Physician: Rolla Plate [[id number]]  Estimated length of stay: past midnight tomorrow  Certification:: I certify this patient will need inpatient services for at least 2 midnights  PT Class (Do Not Modify): Inpatient [101]  PT Acc Code (Do Not Modify): Private [1]       B Medical/Surgery History Past Medical History:  Diagnosis Date  . Anemia   . Arthritis    Osteoarthritis  . Chronic kidney disease    Chronic renal insufficiency, peritoneal dialysis qday sees dr Moshe Cipro  . Diabetes mellitus    Pre  . Diverticulosis   . GERD (gastroesophageal reflux disease)   . Gout   . Hiatal hernia   . History of blood transfusion   . Hyperlipidemia   . Hypertension   . Osteopenia   . Pancreatic cyst 1999  . Thyroid  disease    Hyperparathyroid    Past Surgical History:  Procedure Laterality Date  . BIOPSY  04/25/2019   Procedure: BIOPSY;  Surgeon: Otis Brace, MD;  Location: [facility];  Service: Gastroenterology;;  . CHOLECYSTECTOMY    . COLONOSCOPY  03/21/12   Next one in 2018  . COLONOSCOPY WITH PROPOFOL N/A 04/25/2019   Procedure: COLONOSCOPY WITH PROPOFOL;  Surgeon: Otis Brace, MD;  Location: [facility];  Service: Gastroenterology;  Laterality: N/A;  . ESOPHAGOGASTRODUODENOSCOPY (EGD) WITH PROPOFOL N/A 04/25/2019   Procedure: ESOPHAGOGASTRODUODENOSCOPY (EGD) WITH PROPOFOL;  Surgeon: Otis Brace, MD;  Location: [facility];  Service: Gastroenterology;  Laterality: N/A;  . EYE SURGERY Left    cataract extraction with IOL  . INSERTION OF DIALYSIS CATHETER N/A 02/15/2017   Procedure: REVISION OF DIALYSIS CATHETER;  Surgeon: Algernon Huxley, MD;  Location: [facility];  Service: Cardiovascular;  Laterality: N/A;  . JOINT REPLACEMENT  2012   left knee  . left knee replacement     . PANCREATIC CYST EXCISION  1999  . POLYPECTOMY  04/25/2019   Procedure: POLYPECTOMY;  Surgeon: Otis Brace, MD;  Location: [facility];  Service: Gastroenterology;;  . Clide Deutscher  04/25/2019   Procedure: SCLEROTHERAPY;  Surgeon: Otis Brace, MD;  Location: [facility];  Service: Gastroenterology;;  . TOTAL HIP ARTHROPLASTY Right 05/26/2015   Procedure: RIGHT TOTAL HIP ARTHROPLASTY ANTERIOR APPROACH;  Surgeon: Gaynelle Arabian, MD;  Location: [facility];  Service: Orthopedics;  Laterality: Right;  . TOTAL KNEE ARTHROPLASTY Right 06/09/2013   Procedure: RIGHT TOTAL KNEE ARTHROPLASTY;  Surgeon: Gearlean Alf, MD;  Location: [facility];  Service: Orthopedics;  Laterality: Right;     A IV Location/Drains/Wounds Patient Lines/Drains/Airways Status   Active Line/Drains/Airways    Name:   Placement date:   Placement time:   Site:   Days:   Peripheral IV 08/28/19 Left Forearm   08/28/19    1917    Forearm   less  than 1   Incision (Closed) 05/26/15 Hip Right   05/26/15    0954     1555   Incision (Closed) 02/15/17 Abdomen   02/15/17    1802     924          Intake/Output Last 24 hours No intake or output data in the 24 hours ending 08/28/19 2327  Labs/Imaging Results for orders placed or performed during the hospital encounter of 08/28/19 (from the past 48 hour(s))  Comprehensive metabolic panel     Status: Abnormal   Collection Time: 08/28/19  7:30 PM  Result Value Ref Range   Sodium 133 (L) 135 - 145 mmol/L   Potassium 4.0 3.5 - 5.1 mmol/L   Chloride 89 (L) 98 - 111 mmol/L   CO2 19 (L) 22 - 32 mmol/L   Glucose, Bld 196 (H) 70 - 99 mg/dL   BUN 77 (H) 8 - 23 mg/dL   Creatinine, Ser 9.55 (H) 0.44 - 1.00 mg/dL   Calcium 10.7 (H) 8.9 - 10.3 mg/dL   Total Protein 7.3 6.5 - 8.1 g/dL   Albumin 2.6 (L) 3.5 - 5.0 g/dL   AST 13 (L) 15 - 41 U/L   ALT 14 0 - 44 U/L   Alkaline Phosphatase 93 38 - 126 U/L   Total Bilirubin 0.9 0.3 - 1.2 mg/dL   GFR calc non Af Amer 3 (L) >60 mL/min   GFR calc Af Amer 4 (L) >60 mL/min   Anion gap 25 (H) 5 - 15    Comment: RESULT CHECKED Performed at [facility], [address] [place], [place] [address]   Lipase, blood     Status: None   Collection Time: 08/28/19  7:30 PM  Result Value Ref Range   Lipase 22 11 - 51 U/L    Comment: Performed at [facility] [address] [place], [place] [address]  Brain natriuretic peptide     Status: None   Collection Time: 08/28/19  7:30 PM  Result Value Ref Range   B Natriuretic Peptide 90.6 0.0 - 100.0 pg/mL    Comment: Performed at [facility] [address] [place], [place] [address]  CBC with Differential     Status: Abnormal   Collection Time: 08/28/19  7:30 PM  Result Value Ref Range   WBC 13.2 (H) 4.0 - 10.5 K/uL   RBC 3.29 (L) 3.87 - 5.11 MIL/uL   Hemoglobin 10.1 (L) 12.0 - 15.0 g/dL   HCT 32.4 (L) 36.0 - 46.0 %   MCV 98.5 80.0 - 100.0 fL   MCH 30.7 26.0 - 34.0 pg   MCHC 31.2 30.0  - 36.0 g/dL   RDW 15.1 11.5 - 15.5 %   Platelets 376 150 - 400 K/uL   nRBC 0.2 0.0 - 0.2 %   Neutrophils Relative % 88 %   Neutro Abs 11.5 (H) 1.7 - 7.7 K/uL   Lymphocytes Relative 8 %   Lymphs Abs  1.1 0.7 - 4.0 K/uL   Monocytes Relative 3 %   Monocytes Absolute 0.5 0.1 - 1.0 K/uL   Eosinophils Relative 0 %   Eosinophils Absolute 0.0 0.0 - 0.5 K/uL   Basophils Relative 0 %   Basophils Absolute 0.0 0.0 - 0.1 K/uL   Immature Granulocytes 1 %   Abs Immature Granulocytes 0.14 (H) 0.00 - 0.07 K/uL    Comment: Performed at [facility] [address] [place], [place] [address]   Ct Abdomen Pelvis Wo Contrast  Result Date: 08/28/2019 CLINICAL DATA:  Abdominal distension, pain, nausea and vomiting. Peritoneal dialysis. 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 EXAM: CT ABDOMEN AND PELVIS WITHOUT CONTRAST TECHNIQUE: Multidetector CT imaging of the abdomen and pelvis was performed following the standard protocol without IV contrast. COMPARISON:  05/20/2019 FINDINGS: Lower chest: The previously demonstrated 4 mm right middle lobe solid lung nodule measures 3 mm in maximum diameter on image number 10 series 5. Hepatobiliary: No focal liver abnormality is seen. Status post cholecystectomy. No biliary dilatation. Pancreas: Stable marked atrophy of the tail and distal body of the pancreas. Spleen: Normal in size without focal abnormality. Adrenals/Urinary Tract: No significant change in bilateral adrenal hyperplasia. No significant change in multiple simple and complicated renal cysts in both kidneys. Poorly distended urinary bladder, obscured by streak artifacts from a right hip prosthesis. No visible ureteral abnormalities. Stomach/Bowel: Interval multiple moderately dilated loops of proximal small bowel with normal caliber or distal small bowel loops. The exact point of transition is difficult to determine. No obstructing mass  visualized. Diffuse wall thickening in the antrum and pyloric regions of the stomach. This appears thicker and more heterogeneous posteriorly, measuring 2.2 cm in thickness. Multiple sigmoid colon diverticula. Vascular/Lymphatic: A previously demonstrated 10 mm short axis gastrohepatic ligament lymph node has a corresponding diameter of 8 mm on image number 14 series 3. No new or enlarging lymph nodes. Atheromatous arterial calcifications without aneurysm. Reproductive: Enlarged uterus containing multiple bulky, densely calcified fibroids. Other: The previously noted paraumbilical hernia containing fat has not changed significantly with other than some edema in the fat today. No air in the hernia today. The peritoneal dialysis catheter tip is in the inferior right pelvis, not well visualized due to the streak artifacts from the right hip prosthesis. Musculoskeletal: Right hip prosthesis with associated streak artifacts. Moderate left hip degenerative changes. Lumbar and lower thoracic spine degenerative changes and mild scoliosis. No evidence of bony metastatic disease. IMPRESSION: 1. Interval partial small bowel obstruction. The exact point of transition is difficult to determine. This is most likely due to an adhesion. 2. 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 Correlation with the exact site of tumor at the time of endoscopic resection would be helpful. 3. Stable mildly enlarged gastrohepatic ligament lymph node suspicious for a possible metastatic node. 4. Sigmoid diverticulosis. 5. Stable paraumbilical hernia containing fat with other than some edema in the fat today. 6. Stable 4 mm right middle lobe solid lung nodule. 7. Stable bilateral simple and complicated renal cysts. 8. Stable uterine fibroids. 9. Stable marked atrophy of the tail and distal body of the pancreas. Electronically Signed   By: Claudie Revering M.D.   On: 08/28/2019 21:08    Pending Labs Unresulted Labs (From admission, onward)    Start     Ordered   08/29/19 0500  Comprehensive  metabolic panel  Tomorrow morning,   R     08/28/19 2320   08/29/19 0500  CBC  Tomorrow morning,   R     08/28/19 2320   08/28/19 2125  SARS CORONAVIRUS 2 (TAT 6-24 HRS) Nasopharyngeal Nasopharyngeal Swab  (Asymptomatic/Tier 2 Patients Labs)  Once,   STAT    Question Answer Comment  Is this test for diagnosis or screening Screening   Symptomatic for COVID-19 as defined by CDC No   Hospitalized for COVID-19 No   Admitted to ICU for COVID-19 No   Previously tested for COVID-19 Yes   Resident in a congregate (group) care setting No   Employed in healthcare setting No   Pregnant No      08/28/19 2126          Vitals/Pain Today's Vitals   08/28/19 1915 08/28/19 2000 08/28/19 2217 08/28/19 2300  BP: (!) 107/56 (!) 109/55 (!) 115/58 (!) 118/56  Pulse: 72 74 73 72  Resp: 17 18 20 19   Temp:      TempSrc:      SpO2: 98% 98% 98% 97%  Weight:      Height:      PainSc:        Isolation Precautions No active isolations  Medications Medications  0.9 %  sodium chloride infusion (has no administration in time range)  aspirin chewable tablet 81 mg (has no administration in time range)  hydrALAZINE (APRESOLINE) tablet 10 mg (has no administration in time range)  labetalol (NORMODYNE) tablet 200 mg (has no administration in time range)  rosuvastatin (CRESTOR) tablet 5 mg (has no administration in time range)  calcitRIOL (ROCALTROL) capsule 0.5 mcg (has no administration in time range)  cinacalcet (SENSIPAR) tablet 30 mg (has no administration in time range)  pantoprazole (PROTONIX) EC tablet 40 mg (has no administration in time range)  sevelamer carbonate (RENVELA) tablet 800 mg (has no administration in time range)  heparin injection 5,000 Units (has no administration in time range)  0.9 %  sodium chloride infusion (has no administration  in time range)  acetaminophen (TYLENOL) tablet 650 mg (has no administration in time range)    Or  acetaminophen (TYLENOL) suppository 650 mg (has no administration in time range)  traMADol (ULTRAM) tablet 50 mg (has no administration in time range)  senna-docusate (Senokot-S) tablet 1 tablet (has no administration in time range)  ondansetron (ZOFRAN) injection 4 mg ( Intravenous Not Given 08/28/19 2317)    Mobility walks with device Low fall risk   Focused Assessments Cardiac Assessment Handoff:  Cardiac Rhythm: Normal sinus rhythm Lab Results  Component Value Date   TROPONINI <0.03 01/04/2019   No results found for: DDIMER Does the Patient currently have chest pain? No     R Recommendations: See Admitting Provider Note  Report given to:   Additional Notes:  Pt here for RLQ pain, on peritoneal dialysis which has not been completed the last two nights (this makes night 3). Lives at home alone. Uses walker sometimes.

## 2019-08-28 NOTE — ED Notes (Signed)
Tried to call pt's nieceTammy Copeland back as requested for update on patient. Pt gives permission to update niece.  No answer, left message for return call.

## 2019-08-29 ENCOUNTER — Inpatient Hospital Stay (HOSPITAL_COMMUNITY): Payer: Medicare Other

## 2019-08-29 ENCOUNTER — Encounter (HOSPITAL_COMMUNITY): Payer: Self-pay | Admitting: General Practice

## 2019-08-29 ENCOUNTER — Other Ambulatory Visit: Payer: Self-pay

## 2019-08-29 DIAGNOSIS — N186 End stage renal disease: Secondary | ICD-10-CM | POA: Diagnosis not present

## 2019-08-29 DIAGNOSIS — Z992 Dependence on renal dialysis: Secondary | ICD-10-CM | POA: Diagnosis not present

## 2019-08-29 DIAGNOSIS — Z4932 Encounter for adequacy testing for peritoneal dialysis: Secondary | ICD-10-CM | POA: Diagnosis not present

## 2019-08-29 DIAGNOSIS — Z23 Encounter for immunization: Secondary | ICD-10-CM | POA: Diagnosis not present

## 2019-08-29 DIAGNOSIS — E119 Type 2 diabetes mellitus without complications: Secondary | ICD-10-CM | POA: Diagnosis not present

## 2019-08-29 DIAGNOSIS — N2581 Secondary hyperparathyroidism of renal origin: Secondary | ICD-10-CM | POA: Diagnosis not present

## 2019-08-29 DIAGNOSIS — D631 Anemia in chronic kidney disease: Secondary | ICD-10-CM | POA: Diagnosis not present

## 2019-08-29 DIAGNOSIS — D509 Iron deficiency anemia, unspecified: Secondary | ICD-10-CM | POA: Diagnosis not present

## 2019-08-29 DIAGNOSIS — E876 Hypokalemia: Secondary | ICD-10-CM | POA: Diagnosis not present

## 2019-08-29 DIAGNOSIS — R82998 Other abnormal findings in urine: Secondary | ICD-10-CM | POA: Diagnosis not present

## 2019-08-29 DIAGNOSIS — E43 Unspecified severe protein-calorie malnutrition: Secondary | ICD-10-CM | POA: Insufficient documentation

## 2019-08-29 DIAGNOSIS — N2589 Other disorders resulting from impaired renal tubular function: Secondary | ICD-10-CM | POA: Diagnosis not present

## 2019-08-29 HISTORY — PX: IR US GUIDE VASC ACCESS RIGHT: IMG2390

## 2019-08-29 HISTORY — PX: IR FLUORO GUIDE CV LINE RIGHT: IMG2283

## 2019-08-29 LAB — COMPREHENSIVE METABOLIC PANEL
ALT: 12 U/L (ref 0–44)
AST: 12 U/L — ABNORMAL LOW (ref 15–41)
Albumin: 2.5 g/dL — ABNORMAL LOW (ref 3.5–5.0)
Alkaline Phosphatase: 84 U/L (ref 38–126)
Anion gap: 22 — ABNORMAL HIGH (ref 5–15)
BUN: 89 mg/dL — ABNORMAL HIGH (ref 8–23)
CO2: 20 mmol/L — ABNORMAL LOW (ref 22–32)
Calcium: 10.4 mg/dL — ABNORMAL HIGH (ref 8.9–10.3)
Chloride: 94 mmol/L — ABNORMAL LOW (ref 98–111)
Creatinine, Ser: 10.77 mg/dL — ABNORMAL HIGH (ref 0.44–1.00)
GFR calc Af Amer: 4 mL/min — ABNORMAL LOW (ref >60)
GFR calc non Af Amer: 3 mL/min — ABNORMAL LOW (ref >60)
Glucose, Bld: 129 mg/dL — ABNORMAL HIGH (ref 70–99)
Potassium: 4.2 mmol/L (ref 3.5–5.1)
Sodium: 136 mmol/L (ref 135–145)
Total Bilirubin: 0.9 mg/dL (ref 0.3–1.2)
Total Protein: 6.3 g/dL — ABNORMAL LOW (ref 6.5–8.1)

## 2019-08-29 LAB — GLUCOSE, CAPILLARY: Glucose-Capillary: 122 mg/dL — ABNORMAL HIGH (ref 70–99)

## 2019-08-29 LAB — CBC
HCT: 30.2 % — ABNORMAL LOW (ref 36.0–46.0)
Hemoglobin: 9.8 g/dL — ABNORMAL LOW (ref 12.0–15.0)
MCH: 30.9 pg (ref 26.0–34.0)
MCHC: 32.5 g/dL (ref 30.0–36.0)
MCV: 95.3 fL (ref 80.0–100.0)
Platelets: 391 10*3/uL (ref 150–400)
RBC: 3.17 MIL/uL — ABNORMAL LOW (ref 3.87–5.11)
RDW: 14.8 % (ref 11.5–15.5)
WBC: 15.2 10*3/uL — ABNORMAL HIGH (ref 4.0–10.5)
nRBC: 0.3 % — ABNORMAL HIGH (ref 0.0–0.2)

## 2019-08-29 LAB — HEPATITIS B SURFACE ANTIGEN: Hepatitis B Surface Ag: NONREACTIVE

## 2019-08-29 LAB — HEPATITIS B CORE ANTIBODY, TOTAL: Hep B Core Total Ab: NONREACTIVE

## 2019-08-29 LAB — HEPATITIS B SURFACE ANTIBODY,QUALITATIVE: Hep B S Ab: REACTIVE — AB

## 2019-08-29 LAB — SARS CORONAVIRUS 2 (TAT 6-24 HRS): SARS Coronavirus 2: NEGATIVE

## 2019-08-29 LAB — MRSA PCR SCREENING: MRSA by PCR: NEGATIVE

## 2019-08-29 MED ORDER — METOPROLOL TARTRATE 5 MG/5ML IV SOLN
5.0000 mg | Freq: Three times a day (TID) | INTRAVENOUS | Status: DC
  Administered 2019-08-29: 11:00:00 5 mg via INTRAVENOUS
  Filled 2019-08-29 (×2): qty 5

## 2019-08-29 MED ORDER — HYDRALAZINE HCL 20 MG/ML IJ SOLN: 5.0000 mg | Freq: Four times a day (QID) | INTRAMUSCULAR | Status: DC | PRN

## 2019-08-29 MED ORDER — HEPARIN SODIUM (PORCINE) 1000 UNIT/ML IJ SOLN
INTRAMUSCULAR | Status: AC
  Filled 2019-08-29: qty 3

## 2019-08-29 MED ORDER — HEPARIN SODIUM (PORCINE) 1000 UNIT/ML IJ SOLN
INTRAMUSCULAR | Status: DC | PRN
  Administered 2019-08-29: 2.6 mL via INTRAVENOUS

## 2019-08-29 MED ORDER — HYDROMORPHONE HCL 1 MG/ML IJ SOLN
0.2000 mg | INTRAMUSCULAR | Status: DC | PRN
  Administered 2019-08-29 – 2019-09-02 (×2): 0.2 mg via INTRAVENOUS
  Filled 2019-08-29 (×2): qty 1

## 2019-08-29 MED ORDER — ONDANSETRON HCL 4 MG/2ML IJ SOLN
4.0000 mg | Freq: Four times a day (QID) | INTRAMUSCULAR | Status: DC | PRN
  Administered 2019-08-29 – 2019-09-05 (×8): 4 mg via INTRAVENOUS
  Filled 2019-08-29 (×9): qty 2

## 2019-08-29 MED ORDER — HEPARIN SODIUM (PORCINE) 1000 UNIT/ML IJ SOLN
INTRAMUSCULAR | Status: AC
  Filled 2019-08-29: qty 1

## 2019-08-29 MED ORDER — LIDOCAINE HCL 1 % IJ SOLN
INTRAMUSCULAR | Status: DC | PRN
  Administered 2019-08-29: 5 mL

## 2019-08-29 MED ORDER — METOPROLOL TARTRATE 5 MG/5ML IV SOLN
2.5000 mg | Freq: Three times a day (TID) | INTRAVENOUS | Status: DC
  Filled 2019-08-29: qty 5

## 2019-08-29 MED ORDER — PANTOPRAZOLE SODIUM 40 MG IV SOLR
40.0000 mg | Freq: Two times a day (BID) | INTRAVENOUS | Status: DC
  Administered 2019-08-29 – 2019-08-30 (×3): 40 mg via INTRAVENOUS
  Filled 2019-08-29 (×4): qty 40

## 2019-08-29 MED ORDER — HEPARIN SODIUM (PORCINE) 5000 UNIT/ML IJ SOLN
5000.0000 [IU] | Freq: Three times a day (TID) | INTRAMUSCULAR | Status: DC
  Administered 2019-08-29 – 2019-09-06 (×23): 5000 [IU] via SUBCUTANEOUS
  Filled 2019-08-29 (×23): qty 1

## 2019-08-29 MED ORDER — DARBEPOETIN ALFA 25 MCG/0.42ML IJ SOSY
25.0000 ug | PREFILLED_SYRINGE | INTRAMUSCULAR | Status: DC
  Filled 2019-08-29: qty 0.42

## 2019-08-29 MED ORDER — CHLORHEXIDINE GLUCONATE CLOTH 2 % EX PADS
6.0000 | MEDICATED_PAD | Freq: Every day | CUTANEOUS | Status: DC
  Administered 2019-08-29 – 2019-09-06 (×8): 6 via TOPICAL

## 2019-08-29 MED ORDER — BISACODYL 10 MG RE SUPP
10.0000 mg | Freq: Once | RECTAL | Status: AC
  Administered 2019-08-29: 10 mg via RECTAL
  Filled 2019-08-29: qty 1

## 2019-08-29 MED ORDER — LIDOCAINE HCL 1 % IJ SOLN
INTRAMUSCULAR | Status: AC
  Filled 2019-08-29: qty 20

## 2019-08-29 NOTE — Progress Notes (Signed)
Scheduled lopressor not given this pm as pt having dialysis this pm. BP running slightly soft. Will notify MD

## 2019-08-29 NOTE — Consult Note (Addendum)
Indication for Consultation:  Management of ESRD/hemodialysis; anemia, hypertension/volume and secondary hyperparathyroidism PCP: Dr. Melford Aase Nephrologist: Dr. Moshe Cipro  HPI: Eileen Perez is a 78 y.o. female with ESRD on CCPD X 7 days weekly at [facility]. She is followed by Dr. Corliss Parish. PMH: HTN, T2DM, gout, pancreatic cyst, diverticulosis, AOCD, SHPT. She has recent history of  carcinoid tumor followed by Dr. Alessandra Bevels, has been seen by Dr. Shonna Chock evidence of metastic disease per note 06/17/2019. Last PD treatment was partial treatment 08/27/2019. I had seen her at the center on 10/6- she was having nausea and vomiting at that time-  Had an endoscopy scheduled- BUT PD cath was working at that time  Patient presented to ED 08/28/2019 with C/Os nausea, says she has not have BM for several days, developed loose stools and lower abdominal pain. She denies bloody stools, hematochezia, hematemesis, fever, chills, no chest pain/SOB.  Upon arrival to ED, CT of  Abdomin/pelvis revealed partial SBO, likely a result of adhesion. Diffuse wall thickening in the antrum and pyloric regions of stomach concerning for progression of patient's carcinoid tumor. She has been seen by general surgery who are recommending bowel rest and IVF for now, will intervene aggressively if symptoms do not improve or worsen.   Currently she is resting comfortably without N & V, able to palpate abdomen without illicitting pain. She is NPO.  Patient was started on PD, has never had HD. SCr 10.77 BUN 89 K+ 4.2 CO2 19 AST 12 ALT 12 WBC 15.2 HGB 9.8 PLT 391 BS 129. Will require temporary catheter and initiation of hemodialysis while awaiting resolution of SBO.     Past Medical History:  Diagnosis Date  . Anemia   . Arthritis    Osteoarthritis  . Chronic kidney disease    Chronic renal insufficiency, peritoneal dialysis qday sees dr Moshe Cipro  . Diabetes mellitus    Pre  .  Diverticulosis   . GERD (gastroesophageal reflux disease)   . Gout   . Hiatal hernia   . History of blood transfusion   . Hyperlipidemia   . Hypertension   . Osteopenia   . Pancreatic cyst 1999  . Thyroid disease    Hyperparathyroid    Past Surgical History:  Procedure Laterality Date  . BIOPSY  04/25/2019   Procedure: BIOPSY;  Surgeon: Otis Brace, MD;  Location: [facility];  Service: Gastroenterology;;  . CHOLECYSTECTOMY    . COLONOSCOPY  03/21/12   Next one in 2018  . COLONOSCOPY WITH PROPOFOL N/A 04/25/2019   Procedure: COLONOSCOPY WITH PROPOFOL;  Surgeon: Otis Brace, MD;  Location: [facility];  Service: Gastroenterology;  Laterality: N/A;  . ESOPHAGOGASTRODUODENOSCOPY (EGD) WITH PROPOFOL N/A 04/25/2019   Procedure: ESOPHAGOGASTRODUODENOSCOPY (EGD) WITH PROPOFOL;  Surgeon: Otis Brace, MD;  Location: [facility];  Service: Gastroenterology;  Laterality: N/A;  . EYE SURGERY Left    cataract extraction with IOL  . INSERTION OF DIALYSIS CATHETER N/A 02/15/2017   Procedure: REVISION OF DIALYSIS CATHETER;  Surgeon: Algernon Huxley, MD;  Location: [facility];  Service: Cardiovascular;  Laterality: N/A;  . JOINT REPLACEMENT  2012   left knee  . left knee replacement     . PANCREATIC CYST EXCISION  1999  . POLYPECTOMY  04/25/2019   Procedure: POLYPECTOMY;  Surgeon: Otis Brace, MD;  Location: [facility];  Service: Gastroenterology;;  . SCLEROTHERAPY  04/25/2019   Procedure: SCLEROTHERAPY;  Surgeon: Otis Brace, MD;  Location: [facility];  Service: Gastroenterology;;  . TOTAL HIP ARTHROPLASTY  Right 05/26/2015   Procedure: RIGHT TOTAL HIP ARTHROPLASTY ANTERIOR APPROACH;  Surgeon: Gaynelle Arabian, MD;  Location: [facility];  Service: Orthopedics;  Laterality: Right;  . TOTAL KNEE ARTHROPLASTY Right 06/09/2013   Procedure: RIGHT TOTAL KNEE ARTHROPLASTY;  Surgeon: Gearlean Alf, MD;  Location: [facility];  Service: Orthopedics;  Laterality: Right;   Family History  Problem  Relation Age of Onset  . Cancer Father        Prostate and Throat  . Hypertension Mother   . Cancer Mother        Colon with METS  . Diabetes Mother    Social History:  reports that she has never smoked. She has never used smokeless tobacco. She reports that she does not drink alcohol or use drugs. Allergies  Allergen Reactions  . Ace Inhibitors     unknown  . Nsaids     unknown   Prior to Admission medications   Medication Sig Start Date End Date Taking? Authorizing Provider  allopurinol (ZYLOPRIM) 300 MG tablet TAKE 1/2 TO 1 TABLET BY  MOUTH DAILY AS DIRECTED Patient taking differently: TAKE 1/2 TO 1 TABLET BY&nbsp;&nbsp;MOUTH DAILY AS DIRECTED 10/30/18   Liane Comber, NP  aspirin 81 MG tablet Take 81 mg by mouth at bedtime.     [provider]  b complex-vitamin c-folic acid (NEPHRO-VITE) 0.8 MG TABS tablet Take 1 tablet by mouth at bedtime.    [provider]  calcitRIOL (ROCALTROL) 0.5 MCG capsule Take 0.5 mcg by mouth daily.     [provider]  cinacalcet (SENSIPAR) 30 MG tablet Take 30 mg by mouth daily.     [provider]  Flaxseed, Linseed, (FLAXSEED OIL) 1000 MG CAPS Take 1 capsule by mouth daily.     [provider]  hydrALAZINE (APRESOLINE) 10 MG tablet Take 1 tablet (10 mg total) by mouth 2 (two) times daily. NEEDS OFFICE VISIT FOR FURTHER REFILLS 07/15/19   Lendon Colonel, NP  labetalol (NORMODYNE) 200 MG tablet TAKE 1 TABLET BY MOUTH TWO  TIMES DAILY Patient taking differently: Take 200 mg by mouth 2 (two) times daily.  01/15/19   Unk Pinto, MD  Omega-3 Fatty Acids (FISH OIL) 1200 MG CAPS Take 1,200 mg by mouth daily.     [provider]  pantoprazole (PROTONIX) 40 MG tablet Take 1 tablet (40 mg total) by mouth 2 (two) times daily. 04/25/19 04/24/20  Otis Brace, MD  rosuvastatin (CRESTOR) 5 MG tablet Take 1 tablet (5 mg total) by mouth daily after supper. 06/25/19   Liane Comber, NP  sevelamer  carbonate (RENVELA) 800 MG tablet Take 800 mg by mouth 3 (three) times daily with meals.     [provider]  ZINC GLUCONATE PO Take 40 mg by mouth daily.     [provider]   Current Facility-Administered Medications  Medication Dose Route Frequency Provider Last Rate Last Dose  . 0.9 %  sodium chloride infusion   Intravenous Continuous Zierle-Ghosh, Asia B, DO      . acetaminophen (TYLENOL) tablet 650 mg  650 mg Oral Q6H PRN Zierle-Ghosh, Asia B, DO       Or  . acetaminophen (TYLENOL) suppository 650 mg  650 mg Rectal Q6H PRN Zierle-Ghosh, Asia B, DO      . Chlorhexidine Gluconate Cloth 2 % PADS 6 each  6 each Topical Q0600 Valentina Gu, NP      . heparin injection 5,000 Units  5,000 Units Subcutaneous Q8H Zierle-Ghosh, [place]  B, DO   5,000 Units at 08/29/19 0541  . hydrALAZINE (APRESOLINE) injection 5 mg  5 mg Intravenous Q6H PRN Omar Person, NP      . HYDROmorphone (DILAUDID) injection 0.2 mg  0.2 mg Intravenous Q4H PRN Omar Person, NP   0.2 mg at 08/29/19 0127  . metoprolol tartrate (LOPRESSOR) injection 5 mg  5 mg Intravenous Q8H Regalado, Belkys A, MD      . ondansetron (ZOFRAN) injection 4 mg  4 mg Intravenous Q6H PRN Omar Person, NP   4 mg at 08/29/19 0541  . pantoprazole (PROTONIX) injection 40 mg  40 mg Intravenous Q12H Hayden Pedro M, NP      . senna-docusate (Senokot-S) tablet 1 tablet  1 tablet Oral QHS PRN Zierle-Ghosh, Asia B, DO      . traMADol (ULTRAM) tablet 50 mg  50 mg Oral Q6H PRN Zierle-Ghosh, Asia B, DO       Labs: Basic Metabolic Panel: Recent Labs  Lab 08/28/19 1930 08/29/19 0754  NA 133* 136  K 4.0 4.2  CL 89* 94*  CO2 19* 20*  GLUCOSE 196* 129*  BUN 77* 89*  CREATININE 9.55* 10.77*  CALCIUM 10.7* 10.4*   Liver Function Tests: Recent Labs  Lab 08/28/19 1930 08/29/19 0754  AST 13* 12*  ALT 14 12  ALKPHOS 93 84  BILITOT 0.9 0.9  PROT 7.3 6.3*  ALBUMIN 2.6* 2.5*   Recent Labs  Lab  08/28/19 1930  LIPASE 22   No results for input(s): AMMONIA in the last 168 hours. CBC: Recent Labs  Lab 08/28/19 1930 08/29/19 0754  WBC 13.2* 15.2*  NEUTROABS 11.5*  --   HGB 10.1* 9.8*  HCT 32.4* 30.2*  MCV 98.5 95.3  PLT 376 391   Cardiac Enzymes: No results for input(s): CKTOTAL, CKMB, CKMBINDEX, TROPONINI in the last 168 hours. CBG: No results for input(s): GLUCAP in the last 168 hours. Iron Studies: No results for input(s): IRON, TIBC, TRANSFERRIN, FERRITIN in the last 72 hours. Studies/Results: Ct Abdomen Pelvis Wo Contrast  Result Date: 08/28/2019 CLINICAL DATA:  Abdominal distension, pain, nausea and vomiting. Peritoneal dialysis. 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 EXAM: CT ABDOMEN AND PELVIS WITHOUT CONTRAST TECHNIQUE: Multidetector CT imaging of the abdomen and pelvis was performed following the standard protocol without IV contrast. COMPARISON:  05/20/2019 FINDINGS: Lower chest: The previously demonstrated 4 mm right middle lobe solid lung nodule measures 3 mm in maximum diameter on image number 10 series 5. Hepatobiliary: No focal liver abnormality is seen. Status post cholecystectomy. No biliary dilatation. Pancreas: Stable marked atrophy of the tail and distal body of the pancreas. Spleen: Normal in size without focal abnormality. Adrenals/Urinary Tract: No significant change in bilateral adrenal hyperplasia. No significant change in multiple simple and complicated renal cysts in both kidneys. Poorly distended urinary bladder, obscured by streak artifacts from a right hip prosthesis. No visible ureteral abnormalities. Stomach/Bowel: Interval multiple moderately dilated loops of proximal small bowel with normal caliber or distal small bowel loops. The exact point of transition is difficult to determine. No obstructing mass visualized. Diffuse wall thickening in the antrum and pyloric regions of the  stomach. This appears thicker and more heterogeneous posteriorly, measuring 2.2 cm in thickness. Multiple sigmoid colon diverticula. Vascular/Lymphatic: A previously demonstrated 10 mm short axis gastrohepatic ligament lymph node has a corresponding diameter of 8 mm on image number 14 series 3. No new or enlarging lymph nodes. Atheromatous arterial calcifications  without aneurysm. Reproductive: Enlarged uterus containing multiple bulky, densely calcified fibroids. Other: The previously noted paraumbilical hernia containing fat has not changed significantly with other than some edema in the fat today. No air in the hernia today. The peritoneal dialysis catheter tip is in the inferior right pelvis, not well visualized due to the streak artifacts from the right hip prosthesis. Musculoskeletal: Right hip prosthesis with associated streak artifacts. Moderate left hip degenerative changes. Lumbar and lower thoracic spine degenerative changes and mild scoliosis. No evidence of bony metastatic disease. IMPRESSION: 1. Interval partial small bowel obstruction. The exact point of transition is difficult to determine. This is most likely due to an adhesion. 2. 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 Correlation with the exact site of tumor at the time of endoscopic resection would be helpful. 3. Stable mildly enlarged gastrohepatic ligament lymph node suspicious for a possible metastatic node. 4. Sigmoid diverticulosis. 5. Stable paraumbilical hernia containing fat with other than some edema in the fat today. 6. Stable 4 mm right middle lobe solid lung nodule. 7. Stable bilateral simple and complicated renal cysts. 8. Stable uterine fibroids. 9. Stable marked atrophy of the tail and distal body of the pancreas. Electronically Signed   By: Claudie Revering M.D.   On: 08/28/2019 21:08    ROS: As per HPI otherwise  negative.   Physical Exam: Vitals:   08/28/19 2217 08/28/19 2300 08/28/19 2359 08/29/19 0603  BP: (!) 115/58 (!) 118/56 122/62 (!) 120/59  Pulse: 73 72 76 72  Resp: 20 19 15 15   Temp:   98.7 F (37.1 C) 98.7 F (37.1 C)  TempSrc:   Oral Oral  SpO2: 98% 97% 98% 97%  Weight:   53.6 kg   Height:   5\' 1"  (1.549 m)      General: Pleasant older female in NAD Head: Normocephalic, atraumatic, sclera non-icteric, mucus membranes are moist Neck: Supple. JVD not elevated. Lungs: Clear bilaterally to auscultation without wheezes, rales, or rhonchi. Breathing is unlabored. Heart: RRR with S1 S2. No murmurs, rubs, or gallops appreciated. Abdomen: Soft, non-tender, non-distended with hypoactive active bowel sounds. No rebound/guarding. No obvious abdominal masses. M-S:  Strength and tone appear normal for age. Lower extremities:without edema or ischemic changes, no open wounds  Neuro: Alert and oriented X 3. Moves all extremities spontaneously. Psych:  Responds to questions appropriately with a normal affect. Dialysis Access: PD catheter drsg CDI LUQ of abdomin  Dialysis Orders: CCPD, 7X Week, EDW 53 (kg) Ca 2.5 (mEq/L) Mg 0.5 (mEq/L) Dextrose 1.5; 2.5; 4.25 %, # Exchanges 5, fill vol 2500 mL, Dwell Time 2 hrs 0 min,     Assessment/Plan: 1.  Partial SBO-per primary. Currently appears stable without  N & V. NPO per primary 2.  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 Did not feel that systemic treatment was needed at that time 3.  ESRD -  CCPD as noted above. SCr 10.77 BUN 89. Have consulted IR for insertion of Vas Cath for hemodialysis. Discussed with radiology-can convert vas cath to [facility] if longer time on HD needed. She has never had HD prior to today, minimal UF with HD, 2.0 hrs No heparin. Watch for dysequilibrium syndrome. MWF while in hospital on hemodialysis. Will need to probe PD catheter blockage when more stable.  4.  Hypertension/volume  - Home BP meds on hold  D/T being NPO. BP is well  controlled, no evidence of volume overload by exam.  5.  Anemia  - HGB 10.1-9.8. Last OP Iron panel OK. Give Aranesp 25 mcg IV with HD today.  6.  Metabolic bone disease -  Has been on Renvela packets 2.4grams TID AC and sensipar 30 mg daily. Corrected Ca 11.6 today- that has been a recent development - due to carcinoid ? Marland Kitchen Resume meds when eating.   7.  Nutrition - NPO at present  [place]. Owens Shark, NP-C 08/29/2019, 9:20 AM  [employer] [phone]  Patient seen and examined, agree with above note with above modifications. She says she feels better than she has been feeling - maybe a little confused ?? -  Conservative management for this partial SBO-  Unclear if it is due to her carcinoid tumor diagnosis- also unclear what role the hypercalcemia could be playing.  Given dysfunction in PD cath and her GI pathology will use HD to support-  S/p temp HD catheter  Corliss Parish, MD 08/29/2019

## 2019-08-29 NOTE — Procedures (Signed)
Patient seen and examined on Hemodialysis. BP (!) 106/57   Pulse 82   Temp 98.7 F (37.1 C) (Oral)   Resp 16   Ht 5\' 1"  (1.549 m)   Wt 51 kg Comment: standing weight  SpO2 99%   BMI 21.24 kg/m   QB 150 mL/ min via new temp catheter UF goal 1L.    Catheter not running well, likely vasospasm s/p procedure.  Positional.  Pt quite nauseated and is vomiting throughout treatment.  Will terminate rx now and bring back tomorrow when vasospasm more likely to be resolved and we can achieve necessary BFR.  Madelon Lips MD [facility] pgr [phone] 5:59 PM

## 2019-08-29 NOTE — Progress Notes (Signed)
tx stopped d/t access issues; increased arterial pressure leading to machine stoppages; Dr. Madelon Lips, Nephrology made aware on the unit.

## 2019-08-29 NOTE — Progress Notes (Signed)
Initial Nutrition Assessment  DOCUMENTATION CODES:   Severe malnutrition in context of chronic illness  INTERVENTION:   -RD will follow for diet advancement and supplement as appropriate  NUTRITION DIAGNOSIS:   Severe Malnutrition related to chronic illness(ESRD on PD) as evidenced by energy intake < or equal to 75% for > or equal to 1 month, moderate fat depletion, severe fat depletion, moderate muscle depletion, severe muscle depletion, percent weight loss.  GOAL:   Patient will meet greater than or equal to 90% of their needs  MONITOR:   PO intake, Supplement acceptance, Diet advancement, Labs, Weight trends, Skin, I & O's  REASON FOR ASSESSMENT:   Malnutrition Screening Tool    ASSESSMENT:   Eileen Perez  is a 78 y.o. female, with history of thyroid disease, pancreatic cyst, hypertension, hyperlipidemia, GERD, diabetes mellitus, ESRD, presents to the ED today with a chief complaint of vomiting.  Patient reports that she has been vomiting for more than 3 days.  She only drinks liquids and does not eat at home for more than a week.  Still she continues to have multiple episodes of emesis each day.  Nonbloody nonbilious emesis.  She reports that before this she did have loose stools for several days.  She cannot quantify exact amount of days.  Loose stool was not watery, and nonbloody.  Patient reports that the symptoms are associated with lower abdominal pain.  It is lower abdomen and straight across.  Not on one side more than the other.  Patient does not make urine.  Patient has no vaginal bleeding.  Patient does report that she has been constipated.  Patient does report 2 abdominal surgeries in her history.  The first is a gallbladder, the second with a pancreatic cyst.  Pt admitted with SBO and history of gastric tumor.   Reviewed I/O's: 0 ml x 24 hours  Per nephrology notes, pt on PD, but plan to place HD cath and transition ot HD while inpatient.   Case discussed with  RN, who reports plan to transition of HD, but plan to verify timing for HD catheter placement.   Spoke with pt at bedside, who was pleasant and in good spirits at time of visit. She reports she has experienced a general decline in health over the past 4 months, due to esophageal strictures (she reports undergoing several EGDs with no improvements). Per pt, she initially had trouble swallowing large pills, however, progressed over the past 3 weeks to being able to consume only liquids. Pt reports that she could only drink the broth of her chicken soup as consuming solids would give her a "stuck" sensation. Over the past 3 weeks, pt reports consuming only Ensure, broth, and fruit juice.   Pt denies any nausea and vomiting today. She reports "feeling better today than I have all week".   Per pt, UBW is around 140#. She estimates she has lost about 20# over the past 4 months, due to poor oral intake. Per wt hx, pt has experienced a 24% weight loss over the past 6 months, which is significant for time frame.   Pt understanding of rationale of NPO status. She is requesting oral nutritional supplements once diet is advanced.   Labs reviewed.   NUTRITION - FOCUSED PHYSICAL EXAM:    Most Recent Value  Orbital Region  Moderate depletion  Upper Arm Region  Severe depletion  Thoracic and Lumbar Region  Severe depletion  Buccal Region  Moderate depletion  Temple Region  Severe depletion  Clavicle Bone Region  Severe depletion  Clavicle and Acromion Bone Region  Severe depletion  Scapular Bone Region  Severe depletion  Dorsal Hand  Moderate depletion  Patellar Region  Moderate depletion  Anterior Thigh Region  Moderate depletion  Posterior Calf Region  Moderate depletion  Edema (RD Assessment)  None  Hair  Reviewed  Eyes  Reviewed  Mouth  Reviewed  Skin  Reviewed  Nails  Reviewed       Diet Order:   Diet Order            Diet NPO time specified  Diet effective now               EDUCATION NEEDS:   Education needs have been addressed  Skin:  Skin Assessment: Reviewed RN Assessment  Last BM:  Unknown  Height:   Ht Readings from Last 1 Encounters:  08/28/19 5\' 1"  (1.549 m)    Weight:   Wt Readings from Last 1 Encounters:  08/28/19 53.6 kg    Ideal Body Weight:  47.7 kg  BMI:  Body mass index is 22.31 kg/m.  Estimated Nutritional Needs:   Kcal:  1650-1850  Protein:  80-95 grams  Fluid:  1000 ml + UOP    Ilya Neely A. Jimmye Norman, RD, LDN, [place] Registered Dietitian II Certified Diabetes Care and Education Specialist Pager: [phone] After hours Pager: [phone]

## 2019-08-29 NOTE — Progress Notes (Signed)
PROGRESS NOTE    Eileen Perez  QBV:[id number] DOB: Sep 08, 1941 DOA: 08/28/2019 PCP: Unk Pinto, MD   Brief Narrative:   Assessment & Plan:   Active Problems:   Small bowel obstruction (HCC)   Protein-calorie malnutrition, severe   1-SBO; -Surgery following.  -hold on NG tube placement.  -Suppos.  Per surgery.   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  GI consulted.  IV protonix.  GI considering repeating endoscopy when current condition resolved. Might need EUS>   3-ESRD on Peritoneal Dialysis: Nephrology consulted.  Underwent right IJ Temp HD catheter 10-09.  4-Severe malnutrition in the context of chronic illness: N.p.o. for now due to SBO.  5-Leukocytosis;  Follow trend.  6-DM; chceck CBG. 7-HTN; on metoprolol at home. Order IV   Estimated body mass index is 22.31 kg/m as calculated from the following:   Height as of this encounter: _0  (1.549 m).   Weight as of this encounter: 53.6 kg.   DVT prophylaxis: Heparin Code Status: Full code Family Communication: Contact daughter later Disposition Plan: Remain in the hospital for management of SBO Consultants:   Nephrology  GI  Surgery  Procedures:   Temporary IJ hemodialysis catheter  Antimicrobials:    Subjective: She has less abdominal pain, still with nausea report reflux burning sensation  Objective: Vitals:   08/28/19 2300 08/28/19 2359 08/29/19 0603 08/29/19 1459  BP: (!) 118/56 122/62 (!) 120/59 119/60  Pulse: 72 76 72 76  Resp: _1 Temp:  98.7 F (37.1 C) 98.7 F (37.1 C) 98.1 F (36.7 C)  TempSrc:  Oral Oral Oral  SpO2: 97% 98% 97% 99%  Weight:  53.6 kg    Height:  _2  (1.549 m)      Intake/Output Summary (Last 24 hours) at 08/29/2019 1627 Last data filed at 08/29/2019 0603 Gross per 24 hour  Intake 0  ml  Output --  Net 0 ml   Filed Weights   08/28/19 1908 08/28/19 2359  Weight: 58.1 kg 53.6 kg    Examination:  General exam: Appears calm and comfortable  Respiratory system: Clear to auscultation. Respiratory effort normal. Cardiovascular system: S1 & S2 heard, RRR. No JVD, murmurs, rubs, gallops or clicks. No pedal edema. Gastrointestinal system: Abdomen is mild distended, soft and mild tender. No organomegaly or masses felt. Normal bowel sounds heard. Central nervous system: Alert and oriented. No focal neurological deficits. Extremities: Symmetric 5 x 5 power. Skin: No rashes, lesions or ulcers    Data Reviewed: I have personally reviewed following labs and imaging studies  CBC: Recent Labs  Lab 08/28/19 1930 08/29/19 0754  WBC 13.2* 15.2*  NEUTROABS 11.5*  --   HGB 10.1* 9.8*  HCT 32.4* 30.2*  MCV 98.5 95.3  PLT 376 280   Basic Metabolic Panel: Recent Labs  Lab 08/28/19 1930 08/29/19 0754  NA 133* 136  K 4.0 4.2  CL 89* 94*  CO2 19* 20*  GLUCOSE 196* 129*  BUN 77* 89*  CREATININE 9.55* 10.77*  CALCIUM 10.7* 10.4*   GFR: Estimated Creatinine Clearance: 3.2 mL/min (A) (by C-G formula based on SCr of 10.77 mg/dL (H)). Liver Function Tests: Recent Labs  Lab 08/28/19 1930 08/29/19 0754  AST 13* 12*  ALT 14 12  ALKPHOS 93 84  BILITOT 0.9 0.9  PROT 7.3 6.3*  ALBUMIN 2.6* 2.5*   Recent Labs  Lab 08/28/19 1930  LIPASE 22   No results for input(s): AMMONIA in the last 168 hours. Coagulation Profile: No results for input(s): INR, PROTIME in the last 168 hours. Cardiac Enzymes: No results for input(s): CKTOTAL, CKMB, CKMBINDEX, TROPONINI in the last 168 hours. BNP (last 3 results) No results for input(s): PROBNP in the last 8760 hours. HbA1C: No results for input(s): HGBA1C in the last 72 hours. CBG: No results for input(s): GLUCAP in the last 168 hours. Lipid Profile: No results for input(s): CHOL, HDL, LDLCALC, TRIG, CHOLHDL, LDLDIRECT in  the last 72 hours. Thyroid Function Tests: No results for input(s): TSH, T4TOTAL, FREET4, T3FREE, THYROIDAB in the last 72 hours. Anemia Panel: No results for input(s): VITAMINB12, FOLATE, FERRITIN, TIBC, IRON, RETICCTPCT in the last 72 hours. Sepsis Labs: No results for input(s): PROCALCITON, LATICACIDVEN in the last 168 hours.  Recent Results (from the past 240 hour(s))  SARS CORONAVIRUS 2 (TAT 6-24 HRS) Nasopharyngeal Nasopharyngeal Swab     Status: None   Collection Time: 08/28/19  9:25 PM   Specimen: Nasopharyngeal Swab  Result Value Ref Range Status   SARS Coronavirus 2 NEGATIVE NEGATIVE Final    Comment: (NOTE) SARS-CoV-2 target nucleic acids are NOT DETECTED. The SARS-CoV-2 RNA is generally detectable in upper and lower respiratory specimens during the acute phase of infection. Negative results do not preclude SARS-CoV-2 infection, do not rule out co-infections with other pathogens, and should not be used as the sole basis for treatment or other patient management decisions. Negative results must be combined with clinical observations, patient history, and epidemiological information. The expected result is Negative. Fact Sheet for Patients: SugarRoll.be Fact Sheet for Healthcare Providers: https://www.woods-mathews.com/ This test is not yet approved or cleared by the [place] FDA and  has been authorized for detection and/or diagnosis of SARS-CoV-2 by FDA under an Emergency Use Authorization (EUA). This EUA will remain  in effect (meaning this test can be used) for the duration of the COVID-19 declaration under Section 56 4(b)(1) of the Act, 21 U.S.C. section 360bbb-3(b)(1), unless the authorization is terminated or revoked sooner. Performed at [facility], [place] [address], [place], [place] [address]   MRSA PCR Screening     Status: None   Collection Time: 08/29/19  1:34 AM   Specimen: Nasopharyngeal  Result Value  Ref Range Status   MRSA by PCR NEGATIVE NEGATIVE Final    Comment:        The GeneXpert MRSA Assay (FDA approved for NASAL specimens only), is one component of a comprehensive MRSA colonization surveillance program. It is not intended to diagnose MRSA infection nor to guide or monitor treatment for MRSA infections. Performed at [facility], [place] [address] [place], [place] [address]          Radiology Studies: Ct Abdomen Pelvis Wo Contrast  Result Date: 08/28/2019 CLINICAL DATA:  Abdominal distension, pain, nausea and vomiting. Peritoneal dialysis. 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 EXAM: CT ABDOMEN AND PELVIS WITHOUT CONTRAST TECHNIQUE: Multidetector CT imaging of the abdomen and pelvis was performed following the standard protocol without IV contrast. COMPARISON:  05/20/2019 FINDINGS: Lower chest: The previously demonstrated 4 mm right middle lobe solid lung nodule measures 3 mm in maximum diameter on image number 10 series 5. Hepatobiliary: No focal liver abnormality is seen. Status post cholecystectomy. No biliary dilatation. Pancreas: Stable marked atrophy of the tail and distal body of the pancreas. Spleen: Normal in size without  focal abnormality. Adrenals/Urinary Tract: No significant change in bilateral adrenal hyperplasia. No significant change in multiple simple and complicated renal cysts in both kidneys. Poorly distended urinary bladder, obscured by streak artifacts from a right hip prosthesis. No visible ureteral abnormalities. Stomach/Bowel: Interval multiple moderately dilated loops of proximal small bowel with normal caliber or distal small bowel loops. The exact point of transition is difficult to determine. No obstructing mass visualized. Diffuse wall thickening in the antrum and pyloric regions of the stomach. This appears thicker and more heterogeneous posteriorly, measuring 2.2 cm in  thickness. Multiple sigmoid colon diverticula. Vascular/Lymphatic: A previously demonstrated 10 mm short axis gastrohepatic ligament lymph node has a corresponding diameter of 8 mm on image number 14 series 3. No new or enlarging lymph nodes. Atheromatous arterial calcifications without aneurysm. Reproductive: Enlarged uterus containing multiple bulky, densely calcified fibroids. Other: The previously noted paraumbilical hernia containing fat has not changed significantly with other than some edema in the fat today. No air in the hernia today. The peritoneal dialysis catheter tip is in the inferior right pelvis, not well visualized due to the streak artifacts from the right hip prosthesis. Musculoskeletal: Right hip prosthesis with associated streak artifacts. Moderate left hip degenerative changes. Lumbar and lower thoracic spine degenerative changes and mild scoliosis. No evidence of bony metastatic disease. IMPRESSION: 1. Interval partial small bowel obstruction. The exact point of transition is difficult to determine. This is most likely due to an adhesion. 2. 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 Correlation with the exact site of tumor at the time of endoscopic resection would be helpful. 3. Stable mildly enlarged gastrohepatic ligament lymph node suspicious for a possible metastatic node. 4. Sigmoid diverticulosis. 5. Stable paraumbilical hernia containing fat with other than some edema in the fat today. 6. Stable 4 mm right middle lobe solid lung nodule. 7. Stable bilateral simple and complicated renal cysts. 8. Stable uterine fibroids. 9. Stable marked atrophy of the tail and distal body of the pancreas. Electronically Signed   By: Claudie Revering M.D.   On: 08/28/2019 21:08   Dg Abd 1 View  Result Date: 08/29/2019 CLINICAL DATA:  Small bowel obstruction. EXAM: ABDOMEN - 1 VIEW COMPARISON:  CT  scan of August 28, 2019. Radiograph of February 14, 2017. FINDINGS: Mildly dilated small bowel loops are noted concerning for distal small bowel obstruction. No colonic dilatation is noted. Status post cholecystectomy. Dialysis catheter is seen in the pelvis. Large calcified uterine fibroid is noted. IMPRESSION: Mildly dilated small bowel loops are noted concerning for distal small bowel obstruction. Electronically Signed   By: Marijo Conception M.D.   On: 08/29/2019 09:31   Ir Fluoro Guide Cv Line Right  Result Date: 08/29/2019 INDICATION: 78 year old female with a history of renal dysfunction EXAM: IMAGE GUIDED PLACEMENT OF TEMPORARY hemodialysis catheter MEDICATIONS: None ANESTHESIA/SEDATION: None FLUOROSCOPY TIME:  Fluoroscopy Time: 0 minutes 6 seconds (1 mGy). COMPLICATIONS: None PROCEDURE: Informed written consent was obtained from the patient's family after a discussion of the risks, benefits, and alternatives to treatment. Questions regarding the procedure were encouraged and answered. The right neck was prepped with chlorhexidine in a sterile fashion, and a sterile drape was applied covering the operative field. Maximum barrier sterile technique with sterile gowns and gloves were used for the procedure. A timeout was performed prior to the initiation of the procedure. A micropuncture kit was utilized to access the right internal  jugular vein under direct, real-time ultrasound guidance after the overlying soft tissues were anesthetized with 1% lidocaine with epinephrine. Ultrasound image documentation was performed. The microwire was kinked to measure appropriate catheter length. A stiff glidewire was advanced to the level of the IVC. A 16 cm hemodialysis catheter was then placed over the wire. Final catheter positioning was confirmed and documented with a spot radiographic image. The catheter aspirates and flushes normally. The catheter was flushed with appropriate volume heparin dwells. Dressings were  applied. The patient tolerated the procedure well without immediate post procedural complication. IMPRESSION: Status post right IJ placement of temporary hemodialysis catheter. Signed, Dulcy Fanny. Dellia Nims, RPVI [facility] Electronically Signed   By: Corrie Mckusick D.O.   On: 08/29/2019 15:04   Ir US Guide Vasc Access Right  Result Date: 08/29/2019 INDICATION: 78 year old female with a history of renal dysfunction EXAM: IMAGE GUIDED PLACEMENT OF TEMPORARY hemodialysis catheter MEDICATIONS: None ANESTHESIA/SEDATION: None FLUOROSCOPY TIME:  Fluoroscopy Time: 0 minutes 6 seconds (1 mGy). COMPLICATIONS: None PROCEDURE: Informed written consent was obtained from the patient's family after a discussion of the risks, benefits, and alternatives to treatment. Questions regarding the procedure were encouraged and answered. The right neck was prepped with chlorhexidine in a sterile fashion, and a sterile drape was applied covering the operative field. Maximum barrier sterile technique with sterile gowns and gloves were used for the procedure. A timeout was performed prior to the initiation of the procedure. A micropuncture kit was utilized to access the right internal jugular vein under direct, real-time ultrasound guidance after the overlying soft tissues were anesthetized with 1% lidocaine with epinephrine. Ultrasound image documentation was performed. The microwire was kinked to measure appropriate catheter length. A stiff glidewire was advanced to the level of the IVC. A 16 cm hemodialysis catheter was then placed over the wire. Final catheter positioning was confirmed and documented with a spot radiographic image. The catheter aspirates and flushes normally. The catheter was flushed with appropriate volume heparin dwells. Dressings were applied. The patient tolerated the procedure well without immediate post procedural complication. IMPRESSION: Status post  right IJ placement of temporary hemodialysis catheter. Signed, Dulcy Fanny. Dellia Nims, RPVI [facility] Radiology Electronically Signed   By: Corrie Mckusick D.O.   On: 08/29/2019 15:04        Scheduled Meds:  Chlorhexidine Gluconate Cloth  6 each Topical Q0600   darbepoetin (ARANESP) injection - DIALYSIS  25 mcg Intravenous Q Fri-HD   heparin       heparin  5,000 Units Subcutaneous Q8H   lidocaine       metoprolol tartrate  2.5 mg Intravenous Q8H   pantoprazole (PROTONIX) IV  40 mg Intravenous Q12H   Continuous Infusions:  sodium chloride       LOS: 1 day    Time spent: 35 minutes.     Elmarie Shiley, MD [facility] Pager [phone]  If 7PM-7AM, please contact night-coverage [email or web address] Password [email or web address] 08/29/2019, 4:27 PM

## 2019-08-29 NOTE — Progress Notes (Deleted)
[facility] Renal Consultation Note    Indication for Consultation:  Management of ESRD/hemodialysis; anemia, hypertension/volume and secondary hyperparathyroidism PCP: Dr. Melford Aase Nephrologist: Dr. Moshe Cipro  HPI: Eileen Perez is a 78 y.o. female with ESRD on CCPD X 7 days weekly at [facility]. She is followed by Dr. Corliss Parish. PMH: HTN, T2DM, gout, pancreatic cyst, diverticulosis, AOCD, SHPT. She has recent history of  carcinoid tumor followed by Dr. Alessandra Bevels, has been seen by Dr. Shonna Chock evidence of metastic disease per note 06/17/2019. Last PD treatment was partial treatment 08/27/2019.   Patient presented to ED 08/28/2019 with C/Os nausea, says she has not have BM for several days, developed loose stools and lower abdominal pain. She denies bloody stools, hematochezia, hematemesis, fever, chills, no chest pain/SOB.  Upon arrival to ED, CT of  Abdomin/pelvis revealed partial SBO, likely a result of adhesion. Diffuse wall thickening in the antrum and pyloric regions of stomach concerning for progression of patient's carcinoid tumor. She has been seen by general surgery who are recommending bowel rest and IVF for now, will intervene aggressively if symptoms do not improve or worsen.   Patient was started on PD, has never had HD. SCr 10.77 BUN 89 K+ 4.2 CO2 19 AST 12 ALT 12 WBC 15.2 HGB 9.8 PLT 391 BS 129. Will require temporary catheter and initiation of hemodialysis while awaiting resolution of SBO.     Past Medical History:  Diagnosis Date  . Anemia   . Arthritis    Osteoarthritis  . Chronic kidney disease    Chronic renal insufficiency, peritoneal dialysis qday sees dr Moshe Cipro  . Diabetes mellitus    Pre  . Diverticulosis   . GERD (gastroesophageal reflux disease)   . Gout   . Hiatal hernia   . History of blood transfusion   . Hyperlipidemia   . Hypertension   . Osteopenia   . Pancreatic cyst 1999  . Thyroid  disease    Hyperparathyroid    Past Surgical History:  Procedure Laterality Date  . BIOPSY  04/25/2019   Procedure: BIOPSY;  Surgeon: Otis Brace, MD;  Location: [facility];  Service: Gastroenterology;;  . CHOLECYSTECTOMY    . COLONOSCOPY  03/21/12   Next one in 2018  . COLONOSCOPY WITH PROPOFOL N/A 04/25/2019   Procedure: COLONOSCOPY WITH PROPOFOL;  Surgeon: Otis Brace, MD;  Location: [facility];  Service: Gastroenterology;  Laterality: N/A;  . ESOPHAGOGASTRODUODENOSCOPY (EGD) WITH PROPOFOL N/A 04/25/2019   Procedure: ESOPHAGOGASTRODUODENOSCOPY (EGD) WITH PROPOFOL;  Surgeon: Otis Brace, MD;  Location: [facility];  Service: Gastroenterology;  Laterality: N/A;  . EYE SURGERY Left    cataract extraction with IOL  . INSERTION OF DIALYSIS CATHETER N/A 02/15/2017   Procedure: REVISION OF DIALYSIS CATHETER;  Surgeon: Algernon Huxley, MD;  Location: [facility];  Service: Cardiovascular;  Laterality: N/A;  . JOINT REPLACEMENT  2012   left knee  . left knee replacement     . PANCREATIC CYST EXCISION  1999  . POLYPECTOMY  04/25/2019   Procedure: POLYPECTOMY;  Surgeon: Otis Brace, MD;  Location: [facility];  Service: Gastroenterology;;  . Clide Deutscher  04/25/2019   Procedure: SCLEROTHERAPY;  Surgeon: Otis Brace, MD;  Location: [facility];  Service: Gastroenterology;;  . TOTAL HIP ARTHROPLASTY Right 05/26/2015   Procedure: RIGHT TOTAL HIP ARTHROPLASTY ANTERIOR APPROACH;  Surgeon: Gaynelle Arabian, MD;  Location: [facility];  Service: Orthopedics;  Laterality: Right;  . TOTAL KNEE ARTHROPLASTY Right 06/09/2013   Procedure: RIGHT TOTAL KNEE ARTHROPLASTY;  Surgeon: Gearlean Alf, MD;  Location: [facility];  Service: Orthopedics;  Laterality: Right;   Family History  Problem Relation Age of Onset  . Cancer Father        Prostate and Throat  . Hypertension Mother   . Cancer Mother        Colon with METS  . Diabetes Mother    Social History:  reports that she has never smoked.  She has never used smokeless tobacco. She reports that she does not drink alcohol or use drugs. Allergies  Allergen Reactions  . Ace Inhibitors     unknown  . Nsaids     unknown   Prior to Admission medications   Medication Sig Start Date End Date Taking? Authorizing Provider  allopurinol (ZYLOPRIM) 300 MG tablet TAKE 1/2 TO 1 TABLET BY  MOUTH DAILY AS DIRECTED Patient taking differently: TAKE 1/2 TO 1 TABLET BY&nbsp;&nbsp;MOUTH DAILY AS DIRECTED 10/30/18   Liane Comber, NP  aspirin 81 MG tablet Take 81 mg by mouth at bedtime.     [provider]  b complex-vitamin c-folic acid (NEPHRO-VITE) 0.8 MG TABS tablet Take 1 tablet by mouth at bedtime.    [provider]  calcitRIOL (ROCALTROL) 0.5 MCG capsule Take 0.5 mcg by mouth daily.     [provider]  cinacalcet (SENSIPAR) 30 MG tablet Take 30 mg by mouth daily.     [provider]  Flaxseed, Linseed, (FLAXSEED OIL) 1000 MG CAPS Take 1 capsule by mouth daily.     [provider]  hydrALAZINE (APRESOLINE) 10 MG tablet Take 1 tablet (10 mg total) by mouth 2 (two) times daily. NEEDS OFFICE VISIT FOR FURTHER REFILLS 07/15/19   Lendon Colonel, NP  labetalol (NORMODYNE) 200 MG tablet TAKE 1 TABLET BY MOUTH TWO  TIMES DAILY Patient taking differently: Take 200 mg by mouth 2 (two) times daily.  01/15/19   Unk Pinto, MD  Omega-3 Fatty Acids (FISH OIL) 1200 MG CAPS Take 1,200 mg by mouth daily.     [provider]  pantoprazole (PROTONIX) 40 MG tablet Take 1 tablet (40 mg total) by mouth 2 (two) times daily. 04/25/19 04/24/20  Otis Brace, MD  rosuvastatin (CRESTOR) 5 MG tablet Take 1 tablet (5 mg total) by mouth daily after supper. 06/25/19   Liane Comber, NP  sevelamer carbonate (RENVELA) 800 MG tablet Take 800 mg by mouth 3 (three) times daily with meals.     [provider]  ZINC GLUCONATE PO Take 40 mg by mouth daily.     [provider]   Current  Facility-Administered Medications  Medication Dose Route Frequency Provider Last Rate Last Dose  . 0.9 %  sodium chloride infusion   Intravenous Continuous Zierle-Ghosh, Asia B, DO      . acetaminophen (TYLENOL) tablet 650 mg  650 mg Oral Q6H PRN Zierle-Ghosh, Asia B, DO       Or  . acetaminophen (TYLENOL) suppository 650 mg  650 mg Rectal Q6H PRN Zierle-Ghosh, Asia B, DO      . Chlorhexidine Gluconate Cloth 2 % PADS 6 each  6 each Topical Q0600 Valentina Gu, NP      . heparin injection 5,000 Units  5,000 Units Subcutaneous Q8H Zierle-Ghosh, Asia B, DO   5,000 Units at 08/29/19 0541  . hydrALAZINE (APRESOLINE) injection 5 mg  5 mg Intravenous Q6H PRN Omar Person, NP      . HYDROmorphone (DILAUDID) injection 0.2 mg  0.2 mg Intravenous  Q4H PRN Omar Person, NP   0.2 mg at 08/29/19 0127  . metoprolol tartrate (LOPRESSOR) injection 5 mg  5 mg Intravenous Q8H Regalado, Belkys A, MD      . ondansetron (ZOFRAN) injection 4 mg  4 mg Intravenous Q6H PRN Omar Person, NP   4 mg at 08/29/19 0541  . pantoprazole (PROTONIX) injection 40 mg  40 mg Intravenous Q12H Hayden Pedro M, NP      . senna-docusate (Senokot-S) tablet 1 tablet  1 tablet Oral QHS PRN Zierle-Ghosh, Asia B, DO      . traMADol (ULTRAM) tablet 50 mg  50 mg Oral Q6H PRN Zierle-Ghosh, Asia B, DO       Labs: Basic Metabolic Panel: Recent Labs  Lab 08/28/19 1930 08/29/19 0754  NA 133* 136  K 4.0 4.2  CL 89* 94*  CO2 19* 20*  GLUCOSE 196* 129*  BUN 77* 89*  CREATININE 9.55* 10.77*  CALCIUM 10.7* 10.4*   Liver Function Tests: Recent Labs  Lab 08/28/19 1930 08/29/19 0754  AST 13* 12*  ALT 14 12  ALKPHOS 93 84  BILITOT 0.9 0.9  PROT 7.3 6.3*  ALBUMIN 2.6* 2.5*   Recent Labs  Lab 08/28/19 1930  LIPASE 22   No results for input(s): AMMONIA in the last 168 hours. CBC: Recent Labs  Lab 08/28/19 1930 08/29/19 0754  WBC 13.2* 15.2*  NEUTROABS 11.5*  --   HGB 10.1* 9.8*  HCT 32.4* 30.2*   MCV 98.5 95.3  PLT 376 391   Cardiac Enzymes: No results for input(s): CKTOTAL, CKMB, CKMBINDEX, TROPONINI in the last 168 hours. CBG: No results for input(s): GLUCAP in the last 168 hours. Iron Studies: No results for input(s): IRON, TIBC, TRANSFERRIN, FERRITIN in the last 72 hours. Studies/Results: Ct Abdomen Pelvis Wo Contrast  Result Date: 08/28/2019 CLINICAL DATA:  Abdominal distension, pain, nausea and vomiting. Peritoneal dialysis. 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 EXAM: CT ABDOMEN AND PELVIS WITHOUT CONTRAST TECHNIQUE: Multidetector CT imaging of the abdomen and pelvis was performed following the standard protocol without IV contrast. COMPARISON:  05/20/2019 FINDINGS: Lower chest: The previously demonstrated 4 mm right middle lobe solid lung nodule measures 3 mm in maximum diameter on image number 10 series 5. Hepatobiliary: No focal liver abnormality is seen. Status post cholecystectomy. No biliary dilatation. Pancreas: Stable marked atrophy of the tail and distal body of the pancreas. Spleen: Normal in size without focal abnormality. Adrenals/Urinary Tract: No significant change in bilateral adrenal hyperplasia. No significant change in multiple simple and complicated renal cysts in both kidneys. Poorly distended urinary bladder, obscured by streak artifacts from a right hip prosthesis. No visible ureteral abnormalities. Stomach/Bowel: Interval multiple moderately dilated loops of proximal small bowel with normal caliber or distal small bowel loops. The exact point of transition is difficult to determine. No obstructing mass visualized. Diffuse wall thickening in the antrum and pyloric regions of the stomach. This appears thicker and more heterogeneous posteriorly, measuring 2.2 cm in thickness. Multiple sigmoid colon diverticula. Vascular/Lymphatic: A previously demonstrated 10 mm short axis gastrohepatic ligament  lymph node has a corresponding diameter of 8 mm on image number 14 series 3. No new or enlarging lymph nodes. Atheromatous arterial calcifications without aneurysm. Reproductive: Enlarged uterus containing multiple bulky, densely calcified fibroids. Other: The previously noted paraumbilical hernia containing fat has not changed significantly with other than some edema in the fat today. No air in the hernia today. The peritoneal dialysis  catheter tip is in the inferior right pelvis, not well visualized due to the streak artifacts from the right hip prosthesis. Musculoskeletal: Right hip prosthesis with associated streak artifacts. Moderate left hip degenerative changes. Lumbar and lower thoracic spine degenerative changes and mild scoliosis. No evidence of bony metastatic disease. IMPRESSION: 1. Interval partial small bowel obstruction. The exact point of transition is difficult to determine. This is most likely due to an adhesion. 2. 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 Correlation with the exact site of tumor at the time of endoscopic resection would be helpful. 3. Stable mildly enlarged gastrohepatic ligament lymph node suspicious for a possible metastatic node. 4. Sigmoid diverticulosis. 5. Stable paraumbilical hernia containing fat with other than some edema in the fat today. 6. Stable 4 mm right middle lobe solid lung nodule. 7. Stable bilateral simple and complicated renal cysts. 8. Stable uterine fibroids. 9. Stable marked atrophy of the tail and distal body of the pancreas. Electronically Signed   By: Claudie Revering M.D.   On: 08/28/2019 21:08    ROS: As per HPI otherwise negative.   Physical Exam: Vitals:   08/28/19 2217 08/28/19 2300 08/28/19 2359 08/29/19 0603  BP: (!) 115/58 (!) 118/56 122/62 (!) 120/59  Pulse: 73 72 76 72  Resp: 20 19 15 15   Temp:   98.7 F (37.1 C) 98.7 F (37.1  C)  TempSrc:   Oral Oral  SpO2: 98% 97% 98% 97%  Weight:   53.6 kg   Height:   5\' 1"  (1.549 m)      General: Pleasant older female in NAD Head: Normocephalic, atraumatic, sclera non-icteric, mucus membranes are moist Neck: Supple. JVD not elevated. Lungs: Clear bilaterally to auscultation without wheezes, rales, or rhonchi. Breathing is unlabored. Heart: RRR with S1 S2. No murmurs, rubs, or gallops appreciated. Abdomen: Soft, non-tender, non-distended with hypoactive active bowel sounds. No rebound/guarding. No obvious abdominal masses. M-S:  Strength and tone appear normal for age. Lower extremities:without edema or ischemic changes, no open wounds  Neuro: Alert and oriented X 3. Moves all extremities spontaneously. Psych:  Responds to questions appropriately with a normal affect. Dialysis Access: PD catheter drsg CDI LUQ of abdomin  Dialysis Orders: CCPD, 7X Week, EDW 53 (kg) Ca 2.5 (mEq/L) Mg 0.5 (mEq/L) Dextrose 1.5; 2.5; 4.25 %, # Exchanges 5, fill vol 2500 mL, Dwell Time 2 hrs 0 min,     Assessment/Plan: 1.  Partial SBO-per primary. Currently appears stable without  N & V. NPO per primary 2.  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  3.  ESRD -  CCPD as noted above. SCr 10.77 BUN 89. Have consulted IR for insertion of Vas Cath for hemodialysis. She has never had HD prior to today, minimal UF with HD, 2.0 hrs No heparin. Watch for dysequilibrium syndrome. MWF while in hospital on hemodialysis. Will need to probe PD catheter blockage when more stable.  4.  Hypertension/volume  - Home BP meds on hold D/T being NPO. BP is well controlled, no evidence of volume overload by exam.  5.  Anemia  - HGB 10.1-9.8. Last OP Iron panel OK. Give Aranesp 40 mcg IV with HD today.  6.  Metabolic bone disease -  Has been on Renvela packets 2.4grams TID AC and sensipar 30 mg daily. Corrected Ca 11.6 today. Resume meds when eating.   7.  Nutrition - NPO at  present  [employer]  Owens Shark, NP-C 08/29/2019, 9:20 AM  [employer] [phone]

## 2019-08-29 NOTE — Progress Notes (Signed)
[facility]/Trauma Progress Note      Assessment/Plan  Hx of gastric carcinoid tumor - followed by [facility] ESRD on PD  PSBO - today pt is nontender on exam and not distended, no vomiting - will try suppository today - if pt gets nauseated or vomits she will need NGT - AM films - ambulate - we will follow  FEN: NPO VTE: SCD's, heparin  ID: none, WBC 15.2, afebrile Foley: none Follow up: TBD    LOS: 1 day    Subjective: CC: indigestion  No abdominal pain. No BM for 3 days. No flatus. No nausea or vomiting overnight.   Objective: Vital signs in last 24 hours: Temp:  [97.7 F (36.5 C)-98.7 F (37.1 C)] 98.7 F (37.1 C) (10/09 0603) Pulse Rate:  [72-76] 72 (10/09 0603) Resp:  [15-20] 15 (10/09 0603) BP: (107-122)/(55-62) 120/59 (10/09 0603) SpO2:  [97 %-99 %] 97 % (10/09 0603) Weight:  [53.6 kg-58.1 kg] 53.6 kg (10/08 2359)    Intake/Output from previous day: No intake/output data recorded. Intake/Output this shift: No intake/output data recorded.  PE: Gen:  Alert, NAD, pleasant, cooperative Pulm:  Rate and effort normal Abd: Soft, NT/ND, no HSM Skin: no rashes noted, warm and dry   Anti-infectives: Anti-infectives (From admission, onward)   None      Lab Results:  Recent Labs    08/28/19 1930 08/29/19 0754  WBC 13.2* 15.2*  HGB 10.1* 9.8*  HCT 32.4* 30.2*  PLT 376 391   BMET Recent Labs    08/28/19 1930 08/29/19 0754  NA 133* 136  K 4.0 4.2  CL 89* 94*  CO2 19* 20*  GLUCOSE 196* 129*  BUN 77* 89*  CREATININE 9.55* 10.77*  CALCIUM 10.7* 10.4*   PT/INR No results for input(s): LABPROT, INR in the last 72 hours. CMP     Component Value Date/Time   NA 136 08/29/2019 0754   K 4.2 08/29/2019 0754   CL 94 (L) 08/29/2019 0754   CO2 20 (L) 08/29/2019 0754   GLUCOSE 129 (H) 08/29/2019 0754   BUN 89 (H) 08/29/2019 0754   CREATININE 10.77 (H) 08/29/2019 0754   CREATININE 8.37 (H) 06/24/2019 1216   CALCIUM 10.4 (H)  08/29/2019 0754   CALCIUM 9.0 12/10/2015 0915   PROT 6.3 (L) 08/29/2019 0754   ALBUMIN 2.5 (L) 08/29/2019 0754   AST 12 (L) 08/29/2019 0754   ALT 12 08/29/2019 0754   ALKPHOS 84 08/29/2019 0754   BILITOT 0.9 08/29/2019 0754   GFRNONAA 3 (L) 08/29/2019 0754   GFRNONAA 4 (L) 06/24/2019 1216   GFRAA 4 (L) 08/29/2019 0754   GFRAA 5 (L) 06/24/2019 1216   Lipase     Component Value Date/Time   LIPASE 22 08/28/2019 1930    Studies/Results: Ct Abdomen Pelvis Wo Contrast  Result Date: 08/28/2019 CLINICAL DATA:  Abdominal distension, pain, nausea and vomiting. Peritoneal dialysis. 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 EXAM: CT ABDOMEN AND PELVIS WITHOUT CONTRAST TECHNIQUE: Multidetector CT imaging of the abdomen and pelvis was performed following the standard protocol without IV contrast. COMPARISON:  05/20/2019 FINDINGS: Lower chest: The previously demonstrated 4 mm right middle lobe solid lung nodule measures 3 mm in maximum diameter on image number 10 series 5. Hepatobiliary: No focal liver abnormality is seen. Status post cholecystectomy. No biliary dilatation. Pancreas: Stable marked atrophy of the tail and distal body of the pancreas. Spleen: Normal in size without focal abnormality. Adrenals/Urinary Tract: No  significant change in bilateral adrenal hyperplasia. No significant change in multiple simple and complicated renal cysts in both kidneys. Poorly distended urinary bladder, obscured by streak artifacts from a right hip prosthesis. No visible ureteral abnormalities. Stomach/Bowel: Interval multiple moderately dilated loops of proximal small bowel with normal caliber or distal small bowel loops. The exact point of transition is difficult to determine. No obstructing mass visualized. Diffuse wall thickening in the antrum and pyloric regions of the stomach. This appears thicker and more heterogeneous posteriorly, measuring  2.2 cm in thickness. Multiple sigmoid colon diverticula. Vascular/Lymphatic: A previously demonstrated 10 mm short axis gastrohepatic ligament lymph node has a corresponding diameter of 8 mm on image number 14 series 3. No new or enlarging lymph nodes. Atheromatous arterial calcifications without aneurysm. Reproductive: Enlarged uterus containing multiple bulky, densely calcified fibroids. Other: The previously noted paraumbilical hernia containing fat has not changed significantly with other than some edema in the fat today. No air in the hernia today. The peritoneal dialysis catheter tip is in the inferior right pelvis, not well visualized due to the streak artifacts from the right hip prosthesis. Musculoskeletal: Right hip prosthesis with associated streak artifacts. Moderate left hip degenerative changes. Lumbar and lower thoracic spine degenerative changes and mild scoliosis. No evidence of bony metastatic disease. IMPRESSION: 1. Interval partial small bowel obstruction. The exact point of transition is difficult to determine. This is most likely due to an adhesion. 2. 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 Correlation with the exact site of tumor at the time of endoscopic resection would be helpful. 3. Stable mildly enlarged gastrohepatic ligament lymph node suspicious for a possible metastatic node. 4. Sigmoid diverticulosis. 5. Stable paraumbilical hernia containing fat with other than some edema in the fat today. 6. Stable 4 mm right middle lobe solid lung nodule. 7. Stable bilateral simple and complicated renal cysts. 8. Stable uterine fibroids. 9. Stable marked atrophy of the tail and distal body of the pancreas. Electronically Signed   By: Claudie Revering M.D.   On: 08/28/2019 21:08   Dg Abd 1 View  Result Date: 08/29/2019 CLINICAL DATA:  Small bowel obstruction. EXAM: ABDOMEN - 1 VIEW  COMPARISON:  CT scan of August 28, 2019. Radiograph of February 14, 2017. FINDINGS: Mildly dilated small bowel loops are noted concerning for distal small bowel obstruction. No colonic dilatation is noted. Status post cholecystectomy. Dialysis catheter is seen in the pelvis. Large calcified uterine fibroid is noted. IMPRESSION: Mildly dilated small bowel loops are noted concerning for distal small bowel obstruction. Electronically Signed   By: Marijo Conception M.D.   On: 08/29/2019 09:31     Kalman Drape, [facility] Pager [phone] Cristine Polio, & Friday 7:00am - 4:30pm Thursdays 7:00am -11:30am

## 2019-08-29 NOTE — Procedures (Signed)
Interventional Radiology Procedure Note  Procedure: Placement of a right IJ approach temp HD catheter. 16cm.  Tip is positioned at the superior cavoatrial junction and catheter is ready for immediate use.  Complications: None Recommendations:  - Ok to use - Do not submerge - Routine line care   Signed,  Dulcy Fanny. Earleen Newport, DO

## 2019-08-29 NOTE — Consult Note (Signed)
Reason for Consult: Abnormal CT Referring Physician: Hospital team  Paidyn Mcferran is an 78 y.o. female.  HPI: Patient seen and examined and her case discussed with the hospital team as well as her primary gastroenterologist Dr. Alessandra Bevels and her previous endoscopies were reviewed and she was actually scheduled today for a repeat endoscopy and she has been feeling bad for about a month but has had nausea vomiting starting on Tuesday which was her last bowel movement and she is feeling better today and she has no other complaints  Past Medical History:  Diagnosis Date  . Anemia   . Arthritis    Osteoarthritis  . Chronic kidney disease    Chronic renal insufficiency, peritoneal dialysis qday sees dr Moshe Cipro  . Diabetes mellitus    Pre  . Diverticulosis   . GERD (gastroesophageal reflux disease)   . Gout   . Hiatal hernia   . History of blood transfusion   . Hyperlipidemia   . Hypertension   . Osteopenia   . Pancreatic cyst 1999  . Thyroid disease    Hyperparathyroid     Past Surgical History:  Procedure Laterality Date  . BIOPSY  04/25/2019   Procedure: BIOPSY;  Surgeon: Otis Brace, MD;  Location: [facility];  Service: Gastroenterology;;  . CHOLECYSTECTOMY    . COLONOSCOPY  03/21/12   Next one in 2018  . COLONOSCOPY WITH PROPOFOL N/A 04/25/2019   Procedure: COLONOSCOPY WITH PROPOFOL;  Surgeon: Otis Brace, MD;  Location: [facility];  Service: Gastroenterology;  Laterality: N/A;  . ESOPHAGOGASTRODUODENOSCOPY (EGD) WITH PROPOFOL N/A 04/25/2019   Procedure: ESOPHAGOGASTRODUODENOSCOPY (EGD) WITH PROPOFOL;  Surgeon: Otis Brace, MD;  Location: [facility];  Service: Gastroenterology;  Laterality: N/A;  . EYE SURGERY Left    cataract extraction with IOL  . INSERTION OF DIALYSIS CATHETER N/A 02/15/2017   Procedure: REVISION OF DIALYSIS CATHETER;  Surgeon: Algernon Huxley, MD;  Location: [facility];  Service: Cardiovascular;  Laterality: N/A;  . JOINT REPLACEMENT  2012    left knee  . left knee replacement     . PANCREATIC CYST EXCISION  1999  . POLYPECTOMY  04/25/2019   Procedure: POLYPECTOMY;  Surgeon: Otis Brace, MD;  Location: [facility];  Service: Gastroenterology;;  . Clide Deutscher  04/25/2019   Procedure: SCLEROTHERAPY;  Surgeon: Otis Brace, MD;  Location: [facility];  Service: Gastroenterology;;  . TOTAL HIP ARTHROPLASTY Right 05/26/2015   Procedure: RIGHT TOTAL HIP ARTHROPLASTY ANTERIOR APPROACH;  Surgeon: Gaynelle Arabian, MD;  Location: [facility];  Service: Orthopedics;  Laterality: Right;  . TOTAL KNEE ARTHROPLASTY Right 06/09/2013   Procedure: RIGHT TOTAL KNEE ARTHROPLASTY;  Surgeon: Gearlean Alf, MD;  Location: [facility];  Service: Orthopedics;  Laterality: Right;    Family History  Problem Relation Age of Onset  . Cancer Father        Prostate and Throat  . Hypertension Mother   . Cancer Mother        Colon with METS  . Diabetes Mother     Social History:  reports that she has never smoked. She has never used smokeless tobacco. She reports that she does not drink alcohol or use drugs.  Allergies:  Allergies  Allergen Reactions  . Ace Inhibitors     unknown  . Nsaids     unknown    Medications: I have reviewed the patient's current medications.  Results for orders placed or performed during the hospital encounter of 08/28/19 (from the past 48 hour(s))  Comprehensive metabolic panel  Status: Abnormal   Collection Time: 08/28/19  7:30 PM  Result Value Ref Range   Sodium 133 (L) 135 - 145 mmol/L   Potassium 4.0 3.5 - 5.1 mmol/L   Chloride 89 (L) 98 - 111 mmol/L   CO2 19 (L) 22 - 32 mmol/L   Glucose, Bld 196 (H) 70 - 99 mg/dL   BUN 77 (H) 8 - 23 mg/dL   Creatinine, Ser 9.55 (H) 0.44 - 1.00 mg/dL   Calcium 10.7 (H) 8.9 - 10.3 mg/dL   Total Protein 7.3 6.5 - 8.1 g/dL   Albumin 2.6 (L) 3.5 - 5.0 g/dL   AST 13 (L) 15 - 41 U/L   ALT 14 0 - 44 U/L   Alkaline Phosphatase 93 38 - 126 U/L   Total Bilirubin 0.9 0.3 - 1.2  mg/dL   GFR calc non Af Amer 3 (L) >60 mL/min   GFR calc Af Amer 4 (L) >60 mL/min   Anion gap 25 (H) 5 - 15    Comment: RESULT CHECKED Performed at [facility], [address], [place], [place] [address]   Lipase, blood     Status: None   Collection Time: 08/28/19  7:30 PM  Result Value Ref Range   Lipase 22 11 - 51 U/L    Comment: Performed at [facility] [address] [place], [place] [address]  Brain natriuretic peptide     Status: None   Collection Time: 08/28/19  7:30 PM  Result Value Ref Range   B Natriuretic Peptide 90.6 0.0 - 100.0 pg/mL    Comment: Performed at [facility] [address], [place], [place] [address]  CBC with Differential     Status: Abnormal   Collection Time: 08/28/19  7:30 PM  Result Value Ref Range   WBC 13.2 (H) 4.0 - 10.5 K/uL   RBC 3.29 (L) 3.87 - 5.11 MIL/uL   Hemoglobin 10.1 (L) 12.0 - 15.0 g/dL   HCT 32.4 (L) 36.0 - 46.0 %   MCV 98.5 80.0 - 100.0 fL   MCH 30.7 26.0 - 34.0 pg   MCHC 31.2 30.0 - 36.0 g/dL   RDW 15.1 11.5 - 15.5 %   Platelets 376 150 - 400 K/uL   nRBC 0.2 0.0 - 0.2 %   Neutrophils Relative % 88 %   Neutro Abs 11.5 (H) 1.7 - 7.7 K/uL   Lymphocytes Relative 8 %   Lymphs Abs 1.1 0.7 - 4.0 K/uL   Monocytes Relative 3 %   Monocytes Absolute 0.5 0.1 - 1.0 K/uL   Eosinophils Relative 0 %   Eosinophils Absolute 0.0 0.0 - 0.5 K/uL   Basophils Relative 0 %   Basophils Absolute 0.0 0.0 - 0.1 K/uL   Immature Granulocytes 1 %   Abs Immature Granulocytes 0.14 (H) 0.00 - 0.07 K/uL    Comment: Performed at [facility], [address] [place], [place] [address]  SARS CORONAVIRUS 2 (TAT 6-24 HRS) Nasopharyngeal Nasopharyngeal Swab     Status: None   Collection Time: 08/28/19  9:25 PM   Specimen: Nasopharyngeal Swab  Result Value Ref Range   SARS Coronavirus 2 NEGATIVE NEGATIVE    Comment: (NOTE) SARS-CoV-2 target nucleic acids are NOT DETECTED. The SARS-CoV-2 RNA is generally detectable in upper and  lower respiratory specimens during the acute phase of infection. Negative results do not preclude SARS-CoV-2 infection, do not rule out co-infections with other pathogens, and should not be used as  the sole basis for treatment or other patient management decisions. Negative results must be combined with clinical observations, patient history, and epidemiological information. The expected result is Negative. Fact Sheet for Patients: SugarRoll.be Fact Sheet for Healthcare Providers: https://www.woods-mathews.com/ This test is not yet approved or cleared by the [place] FDA and  has been authorized for detection and/or diagnosis of SARS-CoV-2 by FDA under an Emergency Use Authorization (EUA). This EUA will remain  in effect (meaning this test can be used) for the duration of the COVID-19 declaration under Section 56 4(b)(1) of the Act, 21 U.S.C. section 360bbb-3(b)(1), unless the authorization is terminated or revoked sooner. Performed at [facility], [place] [address] [place], [place] [address]   MRSA PCR Screening     Status: None   Collection Time: 08/29/19  1:34 AM   Specimen: Nasopharyngeal  Result Value Ref Range   MRSA by PCR NEGATIVE NEGATIVE    Comment:        The GeneXpert MRSA Assay (FDA approved for NASAL specimens only), is one component of a comprehensive MRSA colonization surveillance program. It is not intended to diagnose MRSA infection nor to guide or monitor treatment for MRSA infections. Performed at [facility], [place] [address] [place], [place] [address]   Comprehensive metabolic panel     Status: Abnormal   Collection Time: 08/29/19  7:54 AM  Result Value Ref Range   Sodium 136 135 - 145 mmol/L   Potassium 4.2 3.5 - 5.1 mmol/L   Chloride 94 (L) 98 - 111 mmol/L   CO2 20 (L) 22 - 32 mmol/L   Glucose, Bld 129 (H) 70 - 99 mg/dL   BUN 89 (H) 8 - 23 mg/dL   Creatinine, Ser 10.77 (H) 0.44 - 1.00  mg/dL   Calcium 10.4 (H) 8.9 - 10.3 mg/dL   Total Protein 6.3 (L) 6.5 - 8.1 g/dL   Albumin 2.5 (L) 3.5 - 5.0 g/dL   AST 12 (L) 15 - 41 U/L   ALT 12 0 - 44 U/L   Alkaline Phosphatase 84 38 - 126 U/L   Total Bilirubin 0.9 0.3 - 1.2 mg/dL   GFR calc non Af Amer 3 (L) >60 mL/min   GFR calc Af Amer 4 (L) >60 mL/min   Anion gap 22 (H) 5 - 15    Comment: Performed at [facility], [place] [address], [place], [place] [address]  CBC     Status: Abnormal   Collection Time: 08/29/19  7:54 AM  Result Value Ref Range   WBC 15.2 (H) 4.0 - 10.5 K/uL   RBC 3.17 (L) 3.87 - 5.11 MIL/uL   Hemoglobin 9.8 (L) 12.0 - 15.0 g/dL   HCT 30.2 (L) 36.0 - 46.0 %   MCV 95.3 80.0 - 100.0 fL   MCH 30.9 26.0 - 34.0 pg   MCHC 32.5 30.0 - 36.0 g/dL   RDW 14.8 11.5 - 15.5 %   Platelets 391 150 - 400 K/uL   nRBC 0.3 (H) 0.0 - 0.2 %    Comment: Performed at [facility], [place] [address] [place], [place] [address]    Ct Abdomen Pelvis Wo Contrast  Result Date: 08/28/2019 CLINICAL DATA:  Abdominal distension, pain, nausea and vomiting. Peritoneal dialysis. 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 EXAM: CT ABDOMEN AND PELVIS WITHOUT CONTRAST TECHNIQUE: Multidetector CT imaging of the abdomen and pelvis was performed following the standard protocol without IV  contrast. COMPARISON:  05/20/2019 FINDINGS: Lower chest: The previously demonstrated 4 mm right middle lobe solid lung nodule measures 3 mm in maximum diameter on image number 10 series 5. Hepatobiliary: No focal liver abnormality is seen. Status post cholecystectomy. No biliary dilatation. Pancreas: Stable marked atrophy of the tail and distal body of the pancreas. Spleen: Normal in size without focal abnormality. Adrenals/Urinary Tract: No significant change in bilateral adrenal hyperplasia. No significant change in multiple simple and complicated renal cysts in both kidneys. Poorly  distended urinary bladder, obscured by streak artifacts from a right hip prosthesis. No visible ureteral abnormalities. Stomach/Bowel: Interval multiple moderately dilated loops of proximal small bowel with normal caliber or distal small bowel loops. The exact point of transition is difficult to determine. No obstructing mass visualized. Diffuse wall thickening in the antrum and pyloric regions of the stomach. This appears thicker and more heterogeneous posteriorly, measuring 2.2 cm in thickness. Multiple sigmoid colon diverticula. Vascular/Lymphatic: A previously demonstrated 10 mm short axis gastrohepatic ligament lymph node has a corresponding diameter of 8 mm on image number 14 series 3. No new or enlarging lymph nodes. Atheromatous arterial calcifications without aneurysm. Reproductive: Enlarged uterus containing multiple bulky, densely calcified fibroids. Other: The previously noted paraumbilical hernia containing fat has not changed significantly with other than some edema in the fat today. No air in the hernia today. The peritoneal dialysis catheter tip is in the inferior right pelvis, not well visualized due to the streak artifacts from the right hip prosthesis. Musculoskeletal: Right hip prosthesis with associated streak artifacts. Moderate left hip degenerative changes. Lumbar and lower thoracic spine degenerative changes and mild scoliosis. No evidence of bony metastatic disease. IMPRESSION: 1. Interval partial small bowel obstruction. The exact point of transition is difficult to determine. This is most likely due to an adhesion. 2. 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 Correlation with the exact site of tumor at the time of endoscopic resection would be helpful. 3. Stable mildly enlarged gastrohepatic ligament lymph node suspicious for a possible metastatic node. 4. Sigmoid diverticulosis. 5.  Stable paraumbilical hernia containing fat with other than some edema in the fat today. 6. Stable 4 mm right middle lobe solid lung nodule. 7. Stable bilateral simple and complicated renal cysts. 8. Stable uterine fibroids. 9. Stable marked atrophy of the tail and distal body of the pancreas. Electronically Signed   By: Claudie Revering M.D.   On: 08/28/2019 21:08   Dg Abd 1 View  Result Date: 08/29/2019 CLINICAL DATA:  Small bowel obstruction. EXAM: ABDOMEN - 1 VIEW COMPARISON:  CT scan of August 28, 2019. Radiograph of February 14, 2017. FINDINGS: Mildly dilated small bowel loops are noted concerning for distal small bowel obstruction. No colonic dilatation is noted. Status post cholecystectomy. Dialysis catheter is seen in the pelvis. Large calcified uterine fibroid is noted. IMPRESSION: Mildly dilated small bowel loops are noted concerning for distal small bowel obstruction. Electronically Signed   By: Marijo Conception M.D.   On: 08/29/2019 09:31    ROS negative except above Blood pressure (!) 120/59, pulse 72, temperature 98.7 F (37.1 C), temperature source Oral, resp. rate 15, height 5\' 1"  (1.549 m), weight 53.6 kg, SpO2 97 %. Physical Exam patient looks better than her story sounds vital signs stable afebrile no acute distress exam pertinent for abdomen being soft nontender occasional bowel sound x-ray slightly improved CT reviewed labs reviewed white count  slightly increased  Assessment/Plan: Resolving small bowel obstruction probably from adhesions in patient with abnormal distal stomach Plan: 1 small bowel obstruction has resolved she will need a repeat endoscopy and I will discuss a questionable EUS with her primary gastroenterologist Dr. Alessandra Bevels and will ask my rounding partner Dr. Michail Sermon to check on Sunday and diet per surgical team and hemodialysis per renal team  [facility] 08/29/2019, 11:48 AM

## 2019-08-30 ENCOUNTER — Inpatient Hospital Stay (HOSPITAL_COMMUNITY): Payer: Medicare Other

## 2019-08-30 DIAGNOSIS — N2589 Other disorders resulting from impaired renal tubular function: Secondary | ICD-10-CM | POA: Diagnosis not present

## 2019-08-30 DIAGNOSIS — D509 Iron deficiency anemia, unspecified: Secondary | ICD-10-CM | POA: Diagnosis not present

## 2019-08-30 DIAGNOSIS — Z23 Encounter for immunization: Secondary | ICD-10-CM | POA: Diagnosis not present

## 2019-08-30 DIAGNOSIS — E876 Hypokalemia: Secondary | ICD-10-CM | POA: Diagnosis not present

## 2019-08-30 DIAGNOSIS — N2581 Secondary hyperparathyroidism of renal origin: Secondary | ICD-10-CM | POA: Diagnosis not present

## 2019-08-30 DIAGNOSIS — D631 Anemia in chronic kidney disease: Secondary | ICD-10-CM | POA: Diagnosis not present

## 2019-08-30 DIAGNOSIS — E119 Type 2 diabetes mellitus without complications: Secondary | ICD-10-CM | POA: Diagnosis not present

## 2019-08-30 DIAGNOSIS — Z4932 Encounter for adequacy testing for peritoneal dialysis: Secondary | ICD-10-CM | POA: Diagnosis not present

## 2019-08-30 DIAGNOSIS — Z992 Dependence on renal dialysis: Secondary | ICD-10-CM | POA: Diagnosis not present

## 2019-08-30 DIAGNOSIS — N186 End stage renal disease: Secondary | ICD-10-CM | POA: Diagnosis not present

## 2019-08-30 DIAGNOSIS — R82998 Other abnormal findings in urine: Secondary | ICD-10-CM | POA: Diagnosis not present

## 2019-08-30 LAB — RENAL FUNCTION PANEL
Albumin: 2.2 g/dL — ABNORMAL LOW (ref 3.5–5.0)
Anion gap: 21 — ABNORMAL HIGH (ref 5–15)
BUN: 81 mg/dL — ABNORMAL HIGH (ref 8–23)
CO2: 21 mmol/L — ABNORMAL LOW (ref 22–32)
Calcium: 10 mg/dL (ref 8.9–10.3)
Chloride: 95 mmol/L — ABNORMAL LOW (ref 98–111)
Creatinine, Ser: 8.96 mg/dL — ABNORMAL HIGH (ref 0.44–1.00)
GFR calc Af Amer: 4 mL/min — ABNORMAL LOW (ref >60)
GFR calc non Af Amer: 4 mL/min — ABNORMAL LOW (ref >60)
Glucose, Bld: 140 mg/dL — ABNORMAL HIGH (ref 70–99)
Phosphorus: 7.7 mg/dL — ABNORMAL HIGH (ref 2.5–4.6)
Potassium: 3.8 mmol/L (ref 3.5–5.1)
Sodium: 137 mmol/L (ref 135–145)

## 2019-08-30 LAB — GLUCOSE, CAPILLARY
Glucose-Capillary: 110 mg/dL — ABNORMAL HIGH (ref 70–99)
Glucose-Capillary: 114 mg/dL — ABNORMAL HIGH (ref 70–99)
Glucose-Capillary: 138 mg/dL — ABNORMAL HIGH (ref 70–99)
Glucose-Capillary: 96 mg/dL (ref 70–99)
Glucose-Capillary: 97 mg/dL (ref 70–99)

## 2019-08-30 LAB — CBC
HCT: 29.1 % — ABNORMAL LOW (ref 36.0–46.0)
Hemoglobin: 9.2 g/dL — ABNORMAL LOW (ref 12.0–15.0)
MCH: 31.3 pg (ref 26.0–34.0)
MCHC: 31.6 g/dL (ref 30.0–36.0)
MCV: 99 fL (ref 80.0–100.0)
Platelets: 371 10*3/uL (ref 150–400)
RBC: 2.94 MIL/uL — ABNORMAL LOW (ref 3.87–5.11)
RDW: 15.5 % (ref 11.5–15.5)
WBC: 10 10*3/uL (ref 4.0–10.5)
nRBC: 0.6 % — ABNORMAL HIGH (ref 0.0–0.2)

## 2019-08-30 MED ORDER — LIDOCAINE HCL (PF) 1 % IJ SOLN: 5.0000 mL | INTRAMUSCULAR | Status: DC | PRN

## 2019-08-30 MED ORDER — LIDOCAINE-PRILOCAINE 2.5-2.5 % EX CREA: 1.0000 "application " | TOPICAL_CREAM | CUTANEOUS | Status: DC | PRN

## 2019-08-30 MED ORDER — SODIUM CHLORIDE 0.9 % IV SOLN: 100.0000 mL | INTRAVENOUS | Status: DC | PRN

## 2019-08-30 MED ORDER — PENTAFLUOROPROP-TETRAFLUOROETH EX AERO: 1.0000 "application " | INHALATION_SPRAY | CUTANEOUS | Status: DC | PRN

## 2019-08-30 MED ORDER — ALTEPLASE 2 MG IJ SOLR: 2.0000 mg | Freq: Once | INTRAMUSCULAR | Status: DC | PRN

## 2019-08-30 MED ORDER — METOPROLOL TARTRATE 5 MG/5ML IV SOLN: 2.5000 mg | Freq: Three times a day (TID) | INTRAVENOUS | Status: DC | PRN

## 2019-08-30 MED ORDER — HEPARIN SODIUM (PORCINE) 1000 UNIT/ML DIALYSIS: 1000.0000 [IU] | INTRAMUSCULAR | Status: DC | PRN

## 2019-08-30 NOTE — Progress Notes (Signed)
Occupational Therapy Evaluation Patient Details Name: Eileen Perez MRN: [id number] DOB: [birth year] Today's Date: 08/30/2019    History of Present Illness Pt is 78 yo female presenting with small bowel obstruction. PMH including thyroid disease, pancreatic cyst, hypertension, hyperlipidemia, GERD, diabetes mellitus, ESRD.   Clinical Impression   PTA, pt lived alone and was independent for ADLs and IADLs. Pt currently presents with decreased balance, activity tolerance, and increased pain impacting safe performance of ADLs. Pt performed functional transfers, toileting, and grooming tasks with supervision- min guard A for safety and balance. Pt would benefit from continued OT services acutely to address deficits with activity tolerance, balance, pain, and safe performance of ADLs. Recommend dc home with no OT follow up once medically stable per MD.     Follow Up Recommendations  No OT follow up    Equipment Recommendations  Tub/shower bench;Other (comment)(tub bench vs tub seat pending progress and cost)    Recommendations for Other Services PT consult     Precautions / Restrictions Precautions Precautions: Fall Restrictions Weight Bearing Restrictions: No      Mobility Bed Mobility Overal bed mobility: Modified Independent             General bed mobility comments: required increased time  Transfers Overall transfer level: Needs assistance Equipment used: None Transfers: Sit to/from Stand Sit to Stand: Min guard         General transfer comment: Pt required supervision for safety    Balance Overall balance assessment: Mild deficits observed, not formally tested                                         ADL either performed or assessed with clinical judgement   ADL Overall ADL's : Needs assistance/impaired Eating/Feeding: Independent;Sitting   Grooming: Wash/dry hands;Wash/dry face;Standing;Min guard Grooming Details (indicate cue type and  reason): required min guard A for safety Upper Body Bathing: Supervision/ safety;Sitting   Lower Body Bathing: Supervison/ safety;Sit to/from stand   Upper Body Dressing : Supervision/safety;Sitting   Lower Body Dressing: Sit to/from stand;Supervision/safety Lower Body Dressing Details (indicate cue type and reason): Provided education for compensatory strategies for LB ADLs. Pt demonstrated ability to cross legs to don socks. Toilet Transfer: Min guard;Ambulation;BSC;Regular Glass blower/designer Details (indicate cue type and reason): Required min guard A for safety Toileting- Clothing Manipulation and Hygiene: Supervision/safety;Sit to/from stand Toileting - Clothing Manipulation Details (indicate cue type and reason): Pt able to perform pericare and clean self with supervision following bowel movement     Functional mobility during ADLs: Min guard General ADL Comments: Pt performed most ADLs with supervision, and functional transfers and grooming tasks with min guard A for safety     Vision         Perception     Praxis      Pertinent Vitals/Pain Pain Assessment: No/denies pain Pain Intervention(s): Monitored during session;Repositioned     Hand Dominance Right   Extremity/Trunk Assessment Upper Extremity Assessment Upper Extremity Assessment: Overall WFL for tasks assessed   Lower Extremity Assessment Lower Extremity Assessment: Defer to PT evaluation   Cervical / Trunk Assessment Cervical / Trunk Assessment: Normal   Communication Communication Communication: No difficulties   Cognition Arousal/Alertness: Awake/alert Behavior During Therapy: WFL for tasks assessed/performed Overall Cognitive Status: Within Functional Limits for tasks assessed  General Comments  Pt reported she had bowel movement and she had been sitting in it for two hours because she wanted to make sure it was complete    Exercises      Shoulder Instructions      Home Living Family/patient expects to be discharged to:: Private residence Living Arrangements: Alone Available Help at Discharge: Family;Available PRN/intermittently Type of Home: House Home Access: Stairs to enter [employer] of Steps: 1   Home Layout: One level     Bathroom Shower/Tub: Tub/shower unit;Curtain   Biochemist, clinical: Standard     Home Equipment: Environmental consultant - 2 wheels;Cane - single point;Bedside commode          Prior Functioning/Environment Level of Independence: Independent with assistive device(s)        Comments: Pt prepares simple meals for self        OT Problem List: Decreased strength;Decreased range of motion;Decreased activity tolerance;Impaired balance (sitting and/or standing);Decreased knowledge of precautions;Pain      OT Treatment/Interventions: Self-care/ADL training;DME and/or AE instruction;Patient/family education;Therapeutic activities;Balance training    OT Goals(Current goals can be found in the care plan section) Acute Rehab OT Goals Patient Stated Goal: get back to normal OT Goal Formulation: With patient Time For Goal Achievement: 09/13/19 Potential to Achieve Goals: Good ADL Goals Pt Will Perform Grooming: with modified independence;standing Pt Will Perform Lower Body Dressing: with modified independence;sit to/from stand Pt Will Transfer to Toilet: with modified independence;ambulating;bedside commode Pt Will Perform Toileting - Clothing Manipulation and hygiene: with modified independence;sit to/from stand Pt Will Perform Tub/Shower Transfer: Tub transfer;ambulating;shower seat;rolling walker  OT Frequency: Min 3X/week   Barriers to D/C:            Co-evaluation              AM-PAC OT "6 Clicks" Daily Activity     Outcome Measure Help from another person eating meals?: None Help from another person taking care of personal grooming?: A Little Help from another person  toileting, which includes using toliet, bedpan, or urinal?: A Little Help from another person bathing (including washing, rinsing, drying)?: None Help from another person to put on and taking off regular upper body clothing?: None Help from another person to put on and taking off regular lower body clothing?: None 6 Click Score: 22   End of Session Nurse Communication: Mobility status  Activity Tolerance: Patient tolerated treatment well Patient left: in chair;with call bell/phone within reach  OT Visit Diagnosis: Unsteadiness on feet (R26.81);Other abnormalities of gait and mobility (R26.89);Muscle weakness (generalized) (M62.81);Pain                Time: 1349-1420 OT Time Calculation (min): 31 min Charges:  OT General Charges $OT Visit: 1 Visit OT Evaluation $OT Eval Moderate Complexity: 1 Mod OT Treatments $Self Care/Home Management : 8-22 mins  Gus Rankin, OT Student  Gus Rankin 08/30/2019, 4:12 PM

## 2019-08-30 NOTE — Progress Notes (Signed)
PT Cancellation Note  Patient Details Name: Eileen Perez MRN: [id number] DOB: November 26, 1940   Cancelled Treatment:    Reason Eval/Treat Not Completed: Patient at procedure or test/unavailable. Pt at HD. PT will continue to f/u with pt acutely as available.    [place] 08/30/2019, 10:16 AM

## 2019-08-30 NOTE — Progress Notes (Signed)
Patient's niece, Raechel Chute wanted to be updated regarding patient's condition. The number is [phone]. Patient gave permission to disclose confidential information to her niece since patient's daughter is away and not currently available.

## 2019-08-30 NOTE — Progress Notes (Signed)
     Subjective: Just finished HD.  + BM x 2, feels better.  No abdominal complaints.  Objective: Vital signs in last 24 hours: Temp:  [97.5 F (36.4 C)-98.7 F (37.1 C)] (P) 97.5 F (36.4 C) (10/10 0754) Pulse Rate:  [71-90] (P) 73 (10/10 1000) Resp:  [15-18] 16 (10/10 0448) BP: (75-141)/(44-66) (P) 90/53 (10/10 1000) SpO2:  [96 %-99 %] 99 % (10/10 0448) Weight:  [51 kg-55.1 kg] 51.1 kg (10/10 0754)  N.p.o.  291 IV Stool x2 Afebrile vital signs are stable Creatinine 8.96 H&H is stable A.m. films shows peritoneal dialysis catheter no dilated loops of large or small bowel calcified leiomyoma/postcholecystectomy no bowel obstruction Intake/Output from previous day: 10/09 0701 - 10/10 0700 In: 291 [I.V.:291] Out: -315  Intake/Output this shift: No intake/output data recorded.  General appearance: alert, cooperative and no distress GI: soft, non-tender; bowel sounds normal; no masses,  no organomegaly  Lab Results:  Recent Labs    08/29/19 0754 08/30/19 0733  WBC 15.2* 10.0  HGB 9.8* 9.2*  HCT 30.2* 29.1*  PLT 391 371    BMET Recent Labs    08/29/19 0754 08/30/19 0733  NA 136 137  K 4.2 3.8  CL 94* 95*  CO2 20* 21*  GLUCOSE 129* 140*  BUN 89* 81*  CREATININE 10.77* 8.96*  CALCIUM 10.4* 10.0   PT/INR No results for input(s): LABPROT, INR in the last 72 hours.  Recent Labs  Lab 08/28/19 1930 08/29/19 0754 08/30/19 0733  AST 13* 12*  --   ALT 14 12  --   ALKPHOS 93 84  --   BILITOT 0.9 0.9  --   PROT 7.3 6.3*  --   ALBUMIN 2.6* 2.5* 2.2*     Lipase     Component Value Date/Time   LIPASE 22 08/28/2019 1930     Medications: . Chlorhexidine Gluconate Cloth  6 each Topical Q0600  . darbepoetin (ARANESP) injection - DIALYSIS  25 mcg Intravenous Q Fri-HD  . heparin  5,000 Units Subcutaneous Q8H  . metoprolol tartrate  2.5 mg Intravenous Q8H  . pantoprazole (PROTONIX) IV  40 mg Intravenous Q12H    Assessment/Plan Hx of gastric carcinoid  tumor - followed by [facility] on PD  PSBO - today pt is nontender on exam and not distended, no vomiting - will try suppository today - if pt gets nauseated or vomits she will need NGT - AM films - ambulate - we will follow  FEN: NPO  >> clears this AM VTE: SCD's, heparin  ID: none, WBC 15.2, afebrile Foley: none Follow up: TBD    Plan:  Clear liquids and advance as tolerated  LOS: 2 days    Eduard Penkala 08/30/2019 [id number]

## 2019-08-30 NOTE — Progress Notes (Signed)
Subjective:  Seen in HD.  Temp cath was not running well late yesterday - running better today- she said that she had a BM-  Did not need an NGT-   Objective Vital signs in last 24 hours: Vitals:   08/30/19 0754 08/30/19 0805 08/30/19 0830 08/30/19 0900  BP: 129/65 126/66 (!) 119/58 (!) 93/52  Pulse: 78 78 72 71  Resp:      Temp:      TempSrc: Oral     SpO2:      Weight:      Height:       Weight change: -7.061 kg  Intake/Output Summary (Last 24 hours) at 08/30/2019 0921 Last data filed at 08/30/2019 0519 Gross per 24 hour  Intake 291.02 ml  Output -315 ml  Net 606.02 ml   Dialysis Orders: CCPD, 7X Week, EDW 53 (kg) Ca 2.5 (mEq/L) Mg 0.5 (mEq/L) Dextrose 1.5; 2.5; 4.25 %, # Exchanges 5, fill vol 2500 mL, Dwell Time 2 hrs 0 min,     Assessment/Plan: 1.  Partial SBO-per primary. Currently appears stable without  N & V. NPO per primary - had BM per pt, I suspect they will challenge her today with some PO 2.  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 Did not feel that systemic treatment was needed at that time.  Unclear if that is related to recent events  3.  ESRD -  [facility] as noted above. PD cath was not functional in the setting of partial SBO and Felt best to give her belly a break,   S/p  Vas Cath for hemodialysis. The key here is going to be how her belly progresses.  If is better- we could attempt an exchange to see if PD cath can work and if does, have her resume PD. But is belly does not get better enough and cannot get PD cath to be functional, will need to convert vascath to [facility] and send out on hemo to get PD cath fixed as OP by Teparra  4.  Hypertension/volume  - Home BP meds on hold D/T being NPO. BP is well controlled, no evidence of volume overload by exam.  5.  Anemia  - HGB 10.1-9.8. Last OP Iron panel OK. Give Aranesp 25 mcg IV with HD.  6.  Metabolic bone disease -  Has been on Renvela packets 2.4grams TID AC and sensipar 30 mg daily. Corrected Ca  11.6 today- that has been a recent development - due to carcinoid ? Marland Kitchen Resume meds when eating.   7.  Nutrition - NPO at present    [place]: Basic Metabolic Panel: Recent Labs  Lab 08/28/19 1930 08/29/19 0754 08/30/19 0733  NA 133* 136 137  K 4.0 4.2 3.8  CL 89* 94* 95*  CO2 19* 20* 21*  GLUCOSE 196* 129* 140*  BUN 77* 89* 81*  CREATININE 9.55* 10.77* 8.96*  CALCIUM 10.7* 10.4* 10.0  PHOS  --   --  7.7*   Liver Function Tests: Recent Labs  Lab 08/28/19 1930 08/29/19 0754 08/30/19 0733  AST 13* 12*  --   ALT 14 12  --   ALKPHOS 93 84  --   BILITOT 0.9 0.9  --   PROT 7.3 6.3*  --   ALBUMIN 2.6* 2.5* 2.2*   Recent Labs  Lab 08/28/19 1930  LIPASE 22   No results for input(s): AMMONIA in the last 168 hours. CBC: Recent Labs  Lab  08/28/19 1930 08/29/19 0754 08/30/19 0733  WBC 13.2* 15.2* 10.0  NEUTROABS 11.5*  --   --   HGB 10.1* 9.8* 9.2*  HCT 32.4* 30.2* 29.1*  MCV 98.5 95.3 99.0  PLT 376 391 371   Cardiac Enzymes: No results for input(s): CKTOTAL, CKMB, CKMBINDEX, TROPONINI in the last 168 hours. CBG: Recent Labs  Lab 08/29/19 2006 08/30/19 0007 08/30/19 0447  GLUCAP 122* 114* 138*    Iron Studies: No results for input(s): IRON, TIBC, TRANSFERRIN, FERRITIN in the last 72 hours. Studies/Results: Ct Abdomen Pelvis Wo Contrast  Result Date: 08/28/2019 CLINICAL DATA:  Abdominal distension, pain, nausea and vomiting. Peritoneal dialysis. 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 EXAM: CT ABDOMEN AND PELVIS WITHOUT CONTRAST TECHNIQUE: Multidetector CT imaging of the abdomen and pelvis was performed following the standard protocol without IV contrast. COMPARISON:  05/20/2019 FINDINGS: Lower chest: The previously demonstrated 4 mm right middle lobe solid lung nodule measures 3 mm in maximum diameter on image number 10 series 5. Hepatobiliary: No focal liver  abnormality is seen. Status post cholecystectomy. No biliary dilatation. Pancreas: Stable marked atrophy of the tail and distal body of the pancreas. Spleen: Normal in size without focal abnormality. Adrenals/Urinary Tract: No significant change in bilateral adrenal hyperplasia. No significant change in multiple simple and complicated renal cysts in both kidneys. Poorly distended urinary bladder, obscured by streak artifacts from a right hip prosthesis. No visible ureteral abnormalities. Stomach/Bowel: Interval multiple moderately dilated loops of proximal small bowel with normal caliber or distal small bowel loops. The exact point of transition is difficult to determine. No obstructing mass visualized. Diffuse wall thickening in the antrum and pyloric regions of the stomach. This appears thicker and more heterogeneous posteriorly, measuring 2.2 cm in thickness. Multiple sigmoid colon diverticula. Vascular/Lymphatic: A previously demonstrated 10 mm short axis gastrohepatic ligament lymph node has a corresponding diameter of 8 mm on image number 14 series 3. No new or enlarging lymph nodes. Atheromatous arterial calcifications without aneurysm. Reproductive: Enlarged uterus containing multiple bulky, densely calcified fibroids. Other: The previously noted paraumbilical hernia containing fat has not changed significantly with other than some edema in the fat today. No air in the hernia today. The peritoneal dialysis catheter tip is in the inferior right pelvis, not well visualized due to the streak artifacts from the right hip prosthesis. Musculoskeletal: Right hip prosthesis with associated streak artifacts. Moderate left hip degenerative changes. Lumbar and lower thoracic spine degenerative changes and mild scoliosis. No evidence of bony metastatic disease. IMPRESSION: 1. Interval partial small bowel obstruction. The exact point of transition is difficult to determine. This is most likely due to an adhesion. 2.  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 Correlation with the exact site of tumor at the time of endoscopic resection would be helpful. 3. Stable mildly enlarged gastrohepatic ligament lymph node suspicious for a possible metastatic node. 4. Sigmoid diverticulosis. 5. Stable paraumbilical hernia containing fat with other than some edema in the fat today. 6. Stable 4 mm right middle lobe solid lung nodule. 7. Stable bilateral simple and complicated renal cysts. 8. Stable uterine fibroids. 9. Stable marked atrophy of the tail and distal body of the pancreas. Electronically Signed   By: Claudie Revering M.D.   On: 08/28/2019 21:08   Dg Abd 1 View  Result Date: 08/29/2019 CLINICAL DATA:  Small bowel obstruction. EXAM: ABDOMEN -  1 VIEW COMPARISON:  CT scan of August 28, 2019. Radiograph of February 14, 2017. FINDINGS: Mildly dilated small bowel loops are noted concerning for distal small bowel obstruction. No colonic dilatation is noted. Status post cholecystectomy. Dialysis catheter is seen in the pelvis. Large calcified uterine fibroid is noted. IMPRESSION: Mildly dilated small bowel loops are noted concerning for distal small bowel obstruction. Electronically Signed   By: Marijo Conception M.D.   On: 08/29/2019 09:31   Ir Fluoro Guide Cv Line Right  Result Date: 08/29/2019 INDICATION: 78 year old female with a history of renal dysfunction EXAM: IMAGE GUIDED PLACEMENT OF TEMPORARY hemodialysis catheter MEDICATIONS: None ANESTHESIA/SEDATION: None FLUOROSCOPY TIME:  Fluoroscopy Time: 0 minutes 6 seconds (1 mGy). COMPLICATIONS: None PROCEDURE: Informed written consent was obtained from the patient's family after a discussion of the risks, benefits, and alternatives to treatment. Questions regarding the procedure were encouraged and answered. The right neck was prepped with chlorhexidine in a sterile fashion, and a  sterile drape was applied covering the operative field. Maximum barrier sterile technique with sterile gowns and gloves were used for the procedure. A timeout was performed prior to the initiation of the procedure. A micropuncture kit was utilized to access the right internal jugular vein under direct, real-time ultrasound guidance after the overlying soft tissues were anesthetized with 1% lidocaine with epinephrine. Ultrasound image documentation was performed. The microwire was kinked to measure appropriate catheter length. A stiff glidewire was advanced to the level of the IVC. A 16 cm hemodialysis catheter was then placed over the wire. Final catheter positioning was confirmed and documented with a spot radiographic image. The catheter aspirates and flushes normally. The catheter was flushed with appropriate volume heparin dwells. Dressings were applied. The patient tolerated the procedure well without immediate post procedural complication. IMPRESSION: Status post right IJ placement of temporary hemodialysis catheter. Signed, Dulcy Fanny. Dellia Nims, RPVI [facility] Radiology Electronically Signed   By: Corrie Mckusick D.O.   On: 08/29/2019 15:04   Ir US Guide Vasc Access Right  Result Date: 08/29/2019 INDICATION: 78 year old female with a history of renal dysfunction EXAM: IMAGE GUIDED PLACEMENT OF TEMPORARY hemodialysis catheter MEDICATIONS: None ANESTHESIA/SEDATION: None FLUOROSCOPY TIME:  Fluoroscopy Time: 0 minutes 6 seconds (1 mGy). COMPLICATIONS: None PROCEDURE: Informed written consent was obtained from the patient's family after a discussion of the risks, benefits, and alternatives to treatment. Questions regarding the procedure were encouraged and answered. The right neck was prepped with chlorhexidine in a sterile fashion, and a sterile drape was applied covering the operative field. Maximum barrier sterile technique with sterile gowns and gloves were  used for the procedure. A timeout was performed prior to the initiation of the procedure. A micropuncture kit was utilized to access the right internal jugular vein under direct, real-time ultrasound guidance after the overlying soft tissues were anesthetized with 1% lidocaine with epinephrine. Ultrasound image documentation was performed. The microwire was kinked to measure appropriate catheter length. A stiff glidewire was advanced to the level of the IVC. A 16 cm hemodialysis catheter was then placed over the wire. Final catheter positioning was confirmed and documented with a spot radiographic image. The catheter aspirates and flushes normally. The catheter was flushed with appropriate volume heparin dwells. Dressings were applied. The patient tolerated the procedure well without immediate post procedural complication. IMPRESSION: Status post right IJ placement of temporary hemodialysis catheter. Signed, Dulcy Fanny. Dellia Nims, RPVI [facility] Radiology Electronically Signed   By:  Corrie Mckusick D.O.   On: 08/29/2019 15:04   Medications: Infusions: . sodium chloride 50 mL/hr at 08/30/19 0159  . sodium chloride    . sodium chloride      Scheduled Medications: . Chlorhexidine Gluconate Cloth  6 each Topical Q0600  . darbepoetin (ARANESP) injection - DIALYSIS  25 mcg Intravenous Q Fri-HD  . heparin  5,000 Units Subcutaneous Q8H  . metoprolol tartrate  2.5 mg Intravenous Q8H  . pantoprazole (PROTONIX) IV  40 mg Intravenous Q12H    have reviewed scheduled and prn medications.  Physical Exam: General: seen in HD-  Doing well Heart: RRR Lungs: mostly clear Abdomen: soft, non tender at present  Extremities: no edema Dialysis Access: PD cath and temp vascath placed on 10/9    08/30/2019,9:21 AM  LOS: 2 days

## 2019-08-30 NOTE — Progress Notes (Signed)
PROGRESS NOTE    Eileen Perez  GGY:[id number] DOB: 1941/09/04 DOA: 08/28/2019 PCP: Unk Pinto, MD   Brief Narrative: 78 year old with past medical history significant for pancreatic cyst, hypertension, hyperlipidemia, GERD, diabetes, end-stage renal disease on peritoneal dialysis who presents complaining of nausea and vomiting for the last 3 days prior to admission.  Patient peritoneal catheter was not working so she could not have peritoneal dialysis for the last couple of days. Evaluation in the ED CT scan with findings concerning for SBO and thickening of the antrum and pelvic area consistent with known gastric carcinoid tumor.  Assessment & Plan:   Active Problems:   Small bowel obstruction (HCC)   Protein-calorie malnutrition, severe   1-SBO; -Surgery following.  -hold on NG tube placement.  -Suppos.  -Patient had 2 bowel movement yesterday, she was a started on clear diet today.  Abdominal pain has improved.  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  GI consulted.  IV protonix.  GI considering repeating endoscopy when current condition resolved. Might need EUS>  Awaiting GI recommendation in regards to timing for repeating endoscopy.  3-ESRD on Peritoneal Dialysis: Nephrology consulted.  Underwent right IJ Temp HD catheter 10-09. Patient had hemodialysis this morning.  Tolerated it well.  4-Severe malnutrition in the context of chronic illness: We will start protein supplements.  5-Leukocytosis;  Resolved 6-DM; chceck CBG. 7-HTN; holding medication blood pressure soft.   Estimated body mass index is 21.24 kg/m as calculated from the following:   Height as of this encounter: 5' 1" (1.549 m).   Weight as of this encounter: 51 kg.   DVT prophylaxis: Heparin Code Status: Full code Family Communication:  Daughter updated on 08/29/2019 Disposition Plan: Remain in the hospital for management of SBO Consultants:   Nephrology  GI  Surgery  Procedures:   Temporary IJ hemodialysis catheter  Antimicrobials:    Subjective: She reports improvement of abdominal pain, feels better today.  Had 2 bowel movement last night.  Plan to start diet today.  Objective: Vitals:   08/30/19 0930 08/30/19 1000 08/30/19 1100 08/30/19 1125  BP: (!) 86/48 (!) 90/53 (!) 105/49 (!) 113/59  Pulse: 73 73 75 80  Resp:   18 18  Temp:   97.7 F (36.5 C) 97.8 F (36.6 C)  TempSrc:   Oral Oral  SpO2:   (P) 99% 100%  Weight:   51 kg   Height:        Intake/Output Summary (Last 24 hours) at 08/30/2019 1430 Last data filed at 08/30/2019 1030 Gross per 24 hour  Intake 291.02 ml  Output -58 ml  Net 349.02 ml   Filed Weights   08/29/19 1758 08/30/19 0754 08/30/19 1100  Weight: 55.1 kg 51.1 kg 51 kg    Examination:  General exam: NAD Respiratory system: Clear to auscultation Cardiovascular system: S1, S2 regular rate Gastrointestinal system: Bowel sounds present, soft nontender, peritoneal catheter in place Central nervous system: Alert and oriented Extremities: Symmetric power Skin: No rashes    Data Reviewed: I have personally reviewed following labs and imaging studies  CBC: Recent Labs  Lab 08/28/19 1930 08/29/19 0754 08/30/19 0733  WBC 13.2* 15.2* 10.0  NEUTROABS 11.5*  --   --   HGB 10.1* 9.8* 9.2*  HCT 32.4* 30.2* 29.1*  MCV 98.5 95.3 99.0  PLT 376 391 035   Basic Metabolic Panel: Recent Labs  Lab 08/28/19 1930 08/29/19 0754 08/30/19 0733  NA 133* 136 137  K 4.0 4.2 3.8  CL 89* 94* 95*  CO2 19* 20* 21*  GLUCOSE 196* 129* 140*  BUN 77* 89* 81*  CREATININE 9.55* 10.77* 8.96*  CALCIUM 10.7* 10.4* 10.0  PHOS  --   --  7.7*   GFR: Estimated Creatinine Clearance: 3.8 mL/min (A) (by C-G formula based on SCr of 8.96 mg/dL (H)). Liver Function Tests: Recent Labs  Lab  08/28/19 1930 08/29/19 0754 08/30/19 0733  AST 13* 12*  --   ALT 14 12  --   ALKPHOS 93 84  --   BILITOT 0.9 0.9  --   PROT 7.3 6.3*  --   ALBUMIN 2.6* 2.5* 2.2*   Recent Labs  Lab 08/28/19 1930  LIPASE 22   No results for input(s): AMMONIA in the last 168 hours. Coagulation Profile: No results for input(s): INR, PROTIME in the last 168 hours. Cardiac Enzymes: No results for input(s): CKTOTAL, CKMB, CKMBINDEX, TROPONINI in the last 168 hours. BNP (last 3 results) No results for input(s): PROBNP in the last 8760 hours. HbA1C: No results for input(s): HGBA1C in the last 72 hours. CBG: Recent Labs  Lab 08/29/19 2006 08/30/19 0007 08/30/19 0447 08/30/19 1128  GLUCAP 122* 114* 138* 97   Lipid Profile: No results for input(s): CHOL, HDL, LDLCALC, TRIG, CHOLHDL, LDLDIRECT in the last 72 hours. Thyroid Function Tests: No results for input(s): TSH, T4TOTAL, FREET4, T3FREE, THYROIDAB in the last 72 hours. Anemia Panel: No results for input(s): VITAMINB12, FOLATE, FERRITIN, TIBC, IRON, RETICCTPCT in the last 72 hours. Sepsis Labs: No results for input(s): PROCALCITON, LATICACIDVEN in the last 168 hours.  Recent Results (from the past 240 hour(s))  SARS CORONAVIRUS 2 (TAT 6-24 HRS) Nasopharyngeal Nasopharyngeal Swab     Status: None   Collection Time: 08/28/19  9:25 PM   Specimen: Nasopharyngeal Swab  Result Value Ref Range Status   SARS Coronavirus 2 NEGATIVE NEGATIVE Final    Comment: (NOTE) SARS-CoV-2 target nucleic acids are NOT DETECTED. The SARS-CoV-2 RNA is generally detectable in upper and lower respiratory specimens during the acute phase of infection. Negative results do not preclude SARS-CoV-2 infection, do not rule out co-infections with other pathogens, and should not be used as the sole basis for treatment or other patient management decisions. Negative results must be combined with clinical observations, patient history, and epidemiological information. The  expected result is Negative. Fact Sheet for Patients: SugarRoll.be Fact Sheet for Healthcare Providers: https://www.woods-mathews.com/ This test is not yet approved or cleared by the [place] FDA and  has been authorized for detection and/or diagnosis of SARS-CoV-2 by FDA under an Emergency Use Authorization (EUA). This EUA will remain  in effect (meaning this test can be used) for the duration of the COVID-19 declaration under Section 56 4(b)(1) of the Act, 21 U.S.C. section 360bbb-3(b)(1), unless the authorization is terminated or revoked sooner. Performed at [facility], [place] [address] [place], [place] [address]   MRSA PCR Screening     Status: None   Collection Time: 08/29/19  1:34 AM   Specimen: Nasopharyngeal  Result Value Ref Range Status   MRSA by PCR NEGATIVE NEGATIVE Final    Comment:        The GeneXpert MRSA Assay (FDA approved for NASAL specimens only), is one component of a comprehensive MRSA colonization surveillance program. It is not intended to diagnose MRSA infection nor to guide or monitor treatment for MRSA  infections. Performed at [facility], [place] [address] [place] [address] [place], [place] [address]          Radiology Studies: Ct Abdomen Pelvis Wo Contrast  Result Date: 08/28/2019 CLINICAL DATA:  Abdominal distension, pain, nausea and vomiting. Peritoneal dialysis. 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 EXAM: CT ABDOMEN AND PELVIS WITHOUT CONTRAST TECHNIQUE: Multidetector CT imaging of the abdomen and pelvis was performed following the standard protocol without IV contrast. COMPARISON:  05/20/2019 FINDINGS: Lower chest: The previously demonstrated 4 mm right middle lobe solid lung nodule measures 3 mm in maximum diameter on image number 10 series 5. Hepatobiliary: No focal liver abnormality is seen. Status post cholecystectomy. No  biliary dilatation. Pancreas: Stable marked atrophy of the tail and distal body of the pancreas. Spleen: Normal in size without focal abnormality. Adrenals/Urinary Tract: No significant change in bilateral adrenal hyperplasia. No significant change in multiple simple and complicated renal cysts in both kidneys. Poorly distended urinary bladder, obscured by streak artifacts from a right hip prosthesis. No visible ureteral abnormalities. Stomach/Bowel: Interval multiple moderately dilated loops of proximal small bowel with normal caliber or distal small bowel loops. The exact point of transition is difficult to determine. No obstructing mass visualized. Diffuse wall thickening in the antrum and pyloric regions of the stomach. This appears thicker and more heterogeneous posteriorly, measuring 2.2 cm in thickness. Multiple sigmoid colon diverticula. Vascular/Lymphatic: A previously demonstrated 10 mm short axis gastrohepatic ligament lymph node has a corresponding diameter of 8 mm on image number 14 series 3. No new or enlarging lymph nodes. Atheromatous arterial calcifications without aneurysm. Reproductive: Enlarged uterus containing multiple bulky, densely calcified fibroids. Other: The previously noted paraumbilical hernia containing fat has not changed significantly with other than some edema in the fat today. No air in the hernia today. The peritoneal dialysis catheter tip is in the inferior right pelvis, not well visualized due to the streak artifacts from the right hip prosthesis. Musculoskeletal: Right hip prosthesis with associated streak artifacts. Moderate left hip degenerative changes. Lumbar and lower thoracic spine degenerative changes and mild scoliosis. No evidence of bony metastatic disease. IMPRESSION: 1. Interval partial small bowel obstruction. The exact point of transition is difficult to determine. This is most likely due to an adhesion. 2. Diffuse wall thickening in the antrum and pyloric regions  of the stomach, concerning for progression of the patient's known gastric carcinoid tumor. Correlation with the exact site of tumor at the time of endoscopic resection would be helpful. 3. Stable mildly enlarged gastrohepatic ligament lymph node suspicious for a possible metastatic node. 4. Sigmoid diverticulosis. 5. Stable paraumbilical hernia containing fat with other than some edema in the fat today. 6. Stable 4 mm right middle lobe solid lung nodule. 7. Stable bilateral simple and complicated renal cysts. 8. Stable uterine fibroids. 9. Stable marked atrophy of the tail and distal body of the pancreas. Electronically Signed   By: Claudie Revering M.D.   On: 08/28/2019 21:08   Dg Abd 1 View  Result Date: 08/29/2019 CLINICAL DATA:  Small bowel obstruction. EXAM: ABDOMEN - 1 VIEW COMPARISON:  CT scan of August 28, 2019. Radiograph of February 14, 2017. FINDINGS: Mildly dilated small bowel loops are noted concerning for distal small bowel obstruction. No colonic dilatation is noted. Status post cholecystectomy. Dialysis catheter is seen in the pelvis. Large calcified uterine fibroid is noted. IMPRESSION: Mildly dilated small bowel loops are noted concerning for distal small bowel obstruction. Electronically Signed  By: Marijo Conception M.D.   On: 08/29/2019 09:31   Ir Fluoro Guide Cv Line Right  Result Date: 08/29/2019 INDICATION: 78 year old female with a history of renal dysfunction EXAM: IMAGE GUIDED PLACEMENT OF TEMPORARY hemodialysis catheter MEDICATIONS: None ANESTHESIA/SEDATION: None FLUOROSCOPY TIME:  Fluoroscopy Time: 0 minutes 6 seconds (1 mGy). COMPLICATIONS: None PROCEDURE: Informed written consent was obtained from the patient's family after a discussion of the risks, benefits, and alternatives to treatment. Questions regarding the procedure were encouraged and answered. The right neck was prepped with chlorhexidine in a sterile fashion, and a sterile drape was applied covering the operative field.  Maximum barrier sterile technique with sterile gowns and gloves were used for the procedure. A timeout was performed prior to the initiation of the procedure. A micropuncture kit was utilized to access the right internal jugular vein under direct, real-time ultrasound guidance after the overlying soft tissues were anesthetized with 1% lidocaine with epinephrine. Ultrasound image documentation was performed. The microwire was kinked to measure appropriate catheter length. A stiff glidewire was advanced to the level of the IVC. A 16 cm hemodialysis catheter was then placed over the wire. Final catheter positioning was confirmed and documented with a spot radiographic image. The catheter aspirates and flushes normally. The catheter was flushed with appropriate volume heparin dwells. Dressings were applied. The patient tolerated the procedure well without immediate post procedural complication. IMPRESSION: Status post right IJ placement of temporary hemodialysis catheter. Signed, Dulcy Fanny. Dellia Nims, RPVI [facility] Radiology Electronically Signed   By: Corrie Mckusick D.O.   On: 08/29/2019 15:04   Ir US Guide Vasc Access Right  Result Date: 08/29/2019 INDICATION: 78 year old female with a history of renal dysfunction EXAM: IMAGE GUIDED PLACEMENT OF TEMPORARY hemodialysis catheter MEDICATIONS: None ANESTHESIA/SEDATION: None FLUOROSCOPY TIME:  Fluoroscopy Time: 0 minutes 6 seconds (1 mGy). COMPLICATIONS: None PROCEDURE: Informed written consent was obtained from the patient's family after a discussion of the risks, benefits, and alternatives to treatment. Questions regarding the procedure were encouraged and answered. The right neck was prepped with chlorhexidine in a sterile fashion, and a sterile drape was applied covering the operative field. Maximum barrier sterile technique with sterile gowns and gloves were used for the procedure. A timeout was performed prior to  the initiation of the procedure. A micropuncture kit was utilized to access the right internal jugular vein under direct, real-time ultrasound guidance after the overlying soft tissues were anesthetized with 1% lidocaine with epinephrine. Ultrasound image documentation was performed. The microwire was kinked to measure appropriate catheter length. A stiff glidewire was advanced to the level of the IVC. A 16 cm hemodialysis catheter was then placed over the wire. Final catheter positioning was confirmed and documented with a spot radiographic image. The catheter aspirates and flushes normally. The catheter was flushed with appropriate volume heparin dwells. Dressings were applied. The patient tolerated the procedure well without immediate post procedural complication. IMPRESSION: Status post right IJ placement of temporary hemodialysis catheter. Signed, Dulcy Fanny. Dellia Nims, RPVI [facility] Radiology Electronically Signed   By: Corrie Mckusick D.O.   On: 08/29/2019 15:04   Dg Abd Portable 1v  Result Date: 08/30/2019 CLINICAL DATA:  small bowel obstruction Update status EXAM: PORTABLE ABDOMEN - 1 VIEW COMPARISON:  Radiograph 08/29/2019 FINDINGS: No peritoneal dialysis catheter coils in the pelvis. No dilated loops of large or small bowel. Calcified leiomyoma noted. Postcholecystectomy. IMPRESSION: No evidence of bowel obstruction. Electronically Signed  By: Suzy Bouchard M.D.   On: 08/30/2019 10:07        Scheduled Meds: . Chlorhexidine Gluconate Cloth  6 each Topical Q0600  . darbepoetin (ARANESP) injection - DIALYSIS  25 mcg Intravenous Q Fri-HD  . heparin  5,000 Units Subcutaneous Q8H  . pantoprazole (PROTONIX) IV  40 mg Intravenous Q12H   Continuous Infusions: . sodium chloride Stopped (08/30/19 0800)     LOS: 2 days    Time spent: 35 minutes.     Elmarie Shiley, MD [facility] Pager [phone]  If 7PM-7AM, please  contact night-coverage [email or web address] Password [email or web address] 08/30/2019, 2:30 PM

## 2019-08-30 NOTE — Procedures (Signed)
Patient was seen on dialysis and the procedure was supervised.  BFR 250  Via vascath BP is  93/52.   Patient appears to be tolerating treatment well- dec UF for low BP- no edema really   Louis Meckel 08/30/2019

## 2019-08-30 NOTE — Progress Notes (Signed)
OT Cancellation Note  Patient Details Name: Eileen Perez MRN: [id number] DOB: Nov 05, 1941   Cancelled Treatment:    Reason Eval/Treat Not Completed: Patient at procedure or test/ unavailable(Pt at HD. Will return as schedule allows. )  Gus Rankin, OT Student  Gus Rankin 08/30/2019, 9:14 AM

## 2019-08-31 DIAGNOSIS — N2589 Other disorders resulting from impaired renal tubular function: Secondary | ICD-10-CM | POA: Diagnosis not present

## 2019-08-31 DIAGNOSIS — Z23 Encounter for immunization: Secondary | ICD-10-CM | POA: Diagnosis not present

## 2019-08-31 DIAGNOSIS — D509 Iron deficiency anemia, unspecified: Secondary | ICD-10-CM | POA: Diagnosis not present

## 2019-08-31 DIAGNOSIS — R82998 Other abnormal findings in urine: Secondary | ICD-10-CM | POA: Diagnosis not present

## 2019-08-31 DIAGNOSIS — Z4932 Encounter for adequacy testing for peritoneal dialysis: Secondary | ICD-10-CM | POA: Diagnosis not present

## 2019-08-31 DIAGNOSIS — E876 Hypokalemia: Secondary | ICD-10-CM | POA: Diagnosis not present

## 2019-08-31 DIAGNOSIS — N2581 Secondary hyperparathyroidism of renal origin: Secondary | ICD-10-CM | POA: Diagnosis not present

## 2019-08-31 DIAGNOSIS — Z992 Dependence on renal dialysis: Secondary | ICD-10-CM | POA: Diagnosis not present

## 2019-08-31 DIAGNOSIS — D631 Anemia in chronic kidney disease: Secondary | ICD-10-CM | POA: Diagnosis not present

## 2019-08-31 DIAGNOSIS — E119 Type 2 diabetes mellitus without complications: Secondary | ICD-10-CM | POA: Diagnosis not present

## 2019-08-31 DIAGNOSIS — N186 End stage renal disease: Secondary | ICD-10-CM | POA: Diagnosis not present

## 2019-08-31 LAB — GLUCOSE, CAPILLARY
Glucose-Capillary: 112 mg/dL — ABNORMAL HIGH (ref 70–99)
Glucose-Capillary: 113 mg/dL — ABNORMAL HIGH (ref 70–99)
Glucose-Capillary: 113 mg/dL — ABNORMAL HIGH (ref 70–99)
Glucose-Capillary: 90 mg/dL (ref 70–99)
Glucose-Capillary: 92 mg/dL (ref 70–99)
Glucose-Capillary: 99 mg/dL (ref 70–99)

## 2019-08-31 MED ORDER — HEPARIN 1000 UNIT/ML FOR PERITONEAL DIALYSIS
INTRAPERITONEAL | Status: DC | PRN
  Filled 2019-08-31: qty 5000

## 2019-08-31 MED ORDER — DELFLEX-LC/1.5% DEXTROSE 344 MOSM/L IP SOLN
INTRAPERITONEAL | Status: DC
  Administered 2019-09-02 – 2019-09-05 (×5): 5000 mL via INTRAPERITONEAL

## 2019-08-31 MED ORDER — PANTOPRAZOLE SODIUM 40 MG PO TBEC
40.0000 mg | DELAYED_RELEASE_TABLET | Freq: Two times a day (BID) | ORAL | Status: DC
  Administered 2019-08-31 – 2019-09-06 (×12): 40 mg via ORAL
  Filled 2019-08-31 (×13): qty 1

## 2019-08-31 MED ORDER — GENTAMICIN SULFATE 0.1 % EX CREA
1.0000 "application " | TOPICAL_CREAM | Freq: Every day | CUTANEOUS | Status: DC
  Administered 2019-09-01 – 2019-09-06 (×9): 1 via TOPICAL
  Filled 2019-08-31: qty 15

## 2019-08-31 MED ORDER — HEPARIN 1000 UNIT/ML FOR PERITONEAL DIALYSIS: 500.0000 [IU] | INTRAMUSCULAR | Status: DC | PRN

## 2019-08-31 NOTE — Progress Notes (Signed)
[facility] Progress Note  Eileen Perez 78 y.o. Apr 20, 1941   Subjective: Feels a little better compared to prior to admission with resolution of the nausea/vomiting. Reports loose stools. Denies abdominal pain.   Objective: Vital signs: Vitals:   08/31/19 0448 08/31/19 1400  BP: (!) 125/55 134/69  Pulse: 68 80  Resp: 16 17  Temp: 98 F (36.7 C) 98.5 F (36.9 C)  SpO2: 97% 100%    Physical Exam: Gen: lethargic, elderly, no acute distress  HEENT: anicteric sclera CV: RRR Chest: CTA B Abd: minimal diffuse tenderness without guarding, peritoneal dialysis catheter noted  Ext: no edema  Lab Results: Recent Labs    08/29/19 0754 08/30/19 0733  NA 136 137  K 4.2 3.8  CL 94* 95*  CO2 20* 21*  GLUCOSE 129* 140*  BUN 89* 81*  CREATININE 10.77* 8.96*  CALCIUM 10.4* 10.0  PHOS  --  7.7*   Recent Labs    08/28/19 1930 08/29/19 0754 08/30/19 0733  AST 13* 12*  --   ALT 14 12  --   ALKPHOS 93 84  --   BILITOT 0.9 0.9  --   PROT 7.3 6.3*  --   ALBUMIN 2.6* 2.5* 2.2*   Recent Labs    08/28/19 1930 08/29/19 0754 08/30/19 0733  WBC 13.2* 15.2* 10.0  NEUTROABS 11.5*  --   --   HGB 10.1* 9.8* 9.2*  HCT 32.4* 30.2* 29.1*  MCV 98.5 95.3 99.0  PLT 376 391 371      Assessment/Plan: Resolving small bowel obstruction with thickening of distal stomach on a non-contrast CT. History of gastric carcinoid tumor. Inpt EGD scheduled for 09/02/19 by Dr. Alessandra Bevels at [place]. NPO p MN Monday night for Tuesday EGD. Continue supportive care. [facility] to f/u tomorrow.   Lear Ng 08/31/2019, 3:31 PM  Questions please call [phone] ID: Eileen Perez, female   DOB: 1941/03/06, 79 y.o.   MRN: [id number]

## 2019-08-31 NOTE — Progress Notes (Signed)
Patient ID: Eileen Perez, female   DOB: 14-Apr-1941, 78 y.o.   MRN: [id number] [facility] Surgery Progress Note:   * No surgery date entered *  Subjective: Mental status is clear;  Had HD yesterday.   Objective: Vital signs in last 24 hours: Temp:  [97.7 F (36.5 C)-98.1 F (36.7 C)] 98 F (36.7 C) (10/11 0448) Pulse Rate:  [68-81] 68 (10/11 0448) Resp:  [16-18] 16 (10/11 0448) BP: (86-125)/(48-59) 125/55 (10/11 0448) SpO2:  [97 %-100 %] 97 % (10/11 0448) Weight:  [51 kg] 51 kg (10/10 1100)  Intake/Output from previous day: 10/10 0701 - 10/11 0700 In: 116 [I.V.:116] Out: 257  Intake/Output this shift: No intake/output data recorded.  Physical Exam: Work of breathing is normal;  Flatus and BMs  Lab Results:  Results for orders placed or performed during the hospital encounter of 08/28/19 (from the past 48 hour(s))  Hepatitis B surface antigen     Status: None   Collection Time: 08/29/19  4:37 PM  Result Value Ref Range   Hepatitis B Surface Ag NON REACTIVE NON REACTIVE    Comment: Performed at [facility], [address], [place], [place] [address]  Hepatitis B surface antibody,qualitative     Status: Abnormal   Collection Time: 08/29/19  5:06 PM  Result Value Ref Range   Hep B S Ab Reactive (A) NON REACTIVE    Comment: (NOTE) Consistent with immunity, greater than 9.9 mIU/mL. Performed at [facility], [place] [address] [place], [place] [address]   Hepatitis B core antibody, total     Status: None   Collection Time: 08/29/19  5:06 PM  Result Value Ref Range   Hep B Core Total Ab NON REACTIVE NON REACTIVE    Comment: Performed at [facility] [address], [place], [place] [address]  Glucose, capillary     Status: Abnormal   Collection Time: 08/29/19  8:06 PM  Result Value Ref Range   Glucose-Capillary 122 (H) 70 - 99 mg/dL  Glucose, capillary     Status: Abnormal   Collection Time: 08/30/19 12:07 AM  Result Value Ref Range    Glucose-Capillary 114 (H) 70 - 99 mg/dL   Comment 1 Notify RN    Comment 2 Document in Chart   Glucose, capillary     Status: Abnormal   Collection Time: 08/30/19  4:47 AM  Result Value Ref Range   Glucose-Capillary 138 (H) 70 - 99 mg/dL   Comment 1 Notify RN    Comment 2 Document in Chart   Renal function panel     Status: Abnormal   Collection Time: 08/30/19  7:33 AM  Result Value Ref Range   Sodium 137 135 - 145 mmol/L   Potassium 3.8 3.5 - 5.1 mmol/L   Chloride 95 (L) 98 - 111 mmol/L   CO2 21 (L) 22 - 32 mmol/L   Glucose, Bld 140 (H) 70 - 99 mg/dL   BUN 81 (H) 8 - 23 mg/dL   Creatinine, Ser 8.96 (H) 0.44 - 1.00 mg/dL   Calcium 10.0 8.9 - 10.3 mg/dL   Phosphorus 7.7 (H) 2.5 - 4.6 mg/dL   Albumin 2.2 (L) 3.5 - 5.0 g/dL   GFR calc non Af Amer 4 (L) >60 mL/min   GFR calc Af Amer 4 (L) >60 mL/min   Anion gap 21 (H) 5 - 15    Comment: CONSISTENT WITH PREVIOUS RESULT Performed at [facility], [address],  [place], [place] [address]   CBC     Status: Abnormal   Collection Time: 08/30/19  7:33 AM  Result Value Ref Range   WBC 10.0 4.0 - 10.5 K/uL   RBC 2.94 (L) 3.87 - 5.11 MIL/uL   Hemoglobin 9.2 (L) 12.0 - 15.0 g/dL   HCT 29.1 (L) 36.0 - 46.0 %   MCV 99.0 80.0 - 100.0 fL   MCH 31.3 26.0 - 34.0 pg   MCHC 31.6 30.0 - 36.0 g/dL   RDW 15.5 11.5 - 15.5 %   Platelets 371 150 - 400 K/uL   nRBC 0.6 (H) 0.0 - 0.2 %    Comment: Performed at [facility], [place] [address] [place], [place] [address]  Glucose, capillary     Status: None   Collection Time: 08/30/19 11:28 AM  Result Value Ref Range   Glucose-Capillary 97 70 - 99 mg/dL  Glucose, capillary     Status: Abnormal   Collection Time: 08/30/19  4:22 PM  Result Value Ref Range   Glucose-Capillary 110 (H) 70 - 99 mg/dL  Glucose, capillary     Status: None   Collection Time: 08/30/19  7:33 PM  Result Value Ref Range   Glucose-Capillary 96 70 - 99 mg/dL   Comment 1 Notify RN    Comment 2 Document in Chart    Glucose, capillary     Status: None   Collection Time: 08/31/19 12:33 AM  Result Value Ref Range   Glucose-Capillary 92 70 - 99 mg/dL   Comment 1 Notify RN    Comment 2 Document in Chart   Glucose, capillary     Status: None   Collection Time: 08/31/19  4:46 AM  Result Value Ref Range   Glucose-Capillary 90 70 - 99 mg/dL   Comment 1 Notify RN    Comment 2 Document in Chart   Glucose, capillary     Status: None   Collection Time: 08/31/19  7:48 AM  Result Value Ref Range   Glucose-Capillary 99 70 - 99 mg/dL    Radiology/Results: Ir Fluoro Guide Cv Line Right  Result Date: 08/29/2019 INDICATION: 78 year old female with a history of renal dysfunction EXAM: IMAGE GUIDED PLACEMENT OF TEMPORARY hemodialysis catheter MEDICATIONS: None ANESTHESIA/SEDATION: None FLUOROSCOPY TIME:  Fluoroscopy Time: 0 minutes 6 seconds (1 mGy). COMPLICATIONS: None PROCEDURE: Informed written consent was obtained from the patient's family after a discussion of the risks, benefits, and alternatives to treatment. Questions regarding the procedure were encouraged and answered. The right neck was prepped with chlorhexidine in a sterile fashion, and a sterile drape was applied covering the operative field. Maximum barrier sterile technique with sterile gowns and gloves were used for the procedure. A timeout was performed prior to the initiation of the procedure. A micropuncture kit was utilized to access the right internal jugular vein under direct, real-time ultrasound guidance after the overlying soft tissues were anesthetized with 1% lidocaine with epinephrine. Ultrasound image documentation was performed. The microwire was kinked to measure appropriate catheter length. A stiff glidewire was advanced to the level of the IVC. A 16 cm hemodialysis catheter was then placed over the wire. Final catheter positioning was confirmed and documented with a spot radiographic image. The catheter aspirates and flushes normally. The  catheter was flushed with appropriate volume heparin dwells. Dressings were applied. The patient tolerated the procedure well without immediate post procedural complication. IMPRESSION: Status post right IJ placement of temporary hemodialysis catheter. Signed, Dulcy Fanny. Earleen Newport DO, RPVI [facility] Inter[facility]  Radiology Special[facility] Radiology Electronically Signed   By: Corrie Mckusick D.O.   On: 08/29/2019 15:04   Ir US Guide Vasc Access Right  Result Date: 08/29/2019 INDICATION: 78 year old female with a history of renal dysfunction EXAM: IMAGE GUIDED PLACEMENT OF TEMPORARY hemodialysis catheter MEDICATIONS: None ANESTHESIA/SEDATION: None FLUOROSCOPY TIME:  Fluoroscopy Time: 0 minutes 6 seconds (1 mGy). COMPLICATIONS: None PROCEDURE: Informed written consent was obtained from the patient's family after a discussion of the risks, benefits, and alternatives to treatment. Questions regarding the procedure were encouraged and answered. The right neck was prepped with chlorhexidine in a sterile fashion, and a sterile drape was applied covering the operative field. Maximum barrier sterile technique with sterile gowns and gloves were used for the procedure. A timeout was performed prior to the initiation of the procedure. A micropuncture kit was utilized to access the right internal jugular vein under direct, real-time ultrasound guidance after the overlying soft tissues were anesthetized with 1% lidocaine with epinephrine. Ultrasound image documentation was performed. The microwire was kinked to measure appropriate catheter length. A stiff glidewire was advanced to the level of the IVC. A 16 cm hemodialysis catheter was then placed over the wire. Final catheter positioning was confirmed and documented with a spot radiographic image. The catheter aspirates and flushes normally. The catheter was flushed with appropriate volume heparin dwells. Dressings were applied. The patient tolerated the procedure well  without immediate post procedural complication. IMPRESSION: Status post right IJ placement of temporary hemodialysis catheter. Signed, Dulcy Fanny. Dellia Nims, RPVI V[facility] Interventional Radiology Specialists [facility] Radiology Electronically Signed   By: Corrie Mckusick D.O.   On: 08/29/2019 15:04   Dg Abd Portable 1v  Result Date: 08/30/2019 CLINICAL DATA:  small bowel obstruction Update status EXAM: PORTABLE ABDOMEN - 1 VIEW COMPARISON:  Radiograph 08/29/2019 FINDINGS: No peritoneal dialysis catheter coils in the pelvis. No dilated loops of large or small bowel. Calcified leiomyoma noted. Postcholecystectomy. IMPRESSION: No evidence of bowel obstruction. Electronically Signed   By: Suzy Bouchard M.D.   On: 08/30/2019 10:07    Anti-infectives: Anti-infectives (From admission, onward)   None      Assessment/Plan: Problem List: Patient Active Problem List   Diagnosis Date Noted  . Protein-calorie malnutrition, severe 08/29/2019  . Small bowel obstruction ([place]) 08/28/2019  . Schatzki's ring of distal esophagus 06/23/2019  . 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 History of colon polyps 06/23/2019  . Enrolled in chronic care management 02/20/2019  . Aortic atherosclerosis ([place]) 02/13/2018  . 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 Overweight (BMI 25.0-29.9) 10/31/2015  . ESRD (end stage renal disease) ([place]) 10/31/2015  . OA (osteoarthritis) of hip 05/26/2015  . Secondary hyperparathyroidism ([place]) 04/16/2014  . Vitamin D deficiency 04/16/2014  . Hyperlipidemia, mixed 04/16/2014  . Medication management 04/16/2014  . Anemia of chronic Renal Dz   . Pancreatic cyst   . GERD   . Gout   . Osteopenia   . Essential hypertension 06/10/2013  . DJD 06/09/2013    Bowel obstruction clearing.   * No surgery date entered *    LOS: 3 days   Matt B. Hassell Done, MD, [facility] [phone] beeper [phone]  08/31/2019 9:10 AM

## 2019-08-31 NOTE — Progress Notes (Signed)
Subjective:  Had brief HD yest- tolerated well.   Partial SBO  Clearing  "I feel like a different person "   Objective Vital signs in last 24 hours: Vitals:   08/30/19 1100 08/30/19 1125 08/30/19 1935 08/31/19 0448  BP: (!) 105/49 (!) 113/59 (!) 121/55 (!) 125/55  Pulse: 75 80 81 68  Resp: 18 18 17 16   Temp: 97.7 F (36.5 C) 97.8 F (36.6 C) 98.1 F (36.7 C) 98 F (36.7 C)  TempSrc: Oral Oral Oral Oral  SpO2: 99% 100% 100% 97%  Weight: 51 kg     Height:       Weight change: 0.1 kg  Intake/Output Summary (Last 24 hours) at 08/31/2019 1309 Last data filed at 08/30/2019 1500 Gross per 24 hour  Intake 116.04 ml  Output -  Net 116.04 ml   Dialysis Orders: CCPD, 7X Week, EDW 53 (kg) Ca 2.5 (mEq/L) Mg 0.5 (mEq/L) Dextrose 1.5; 2.5; 4.25 %, # Exchanges 5, fill vol 2500 mL, Dwell Time 2 hrs 0 min,     Assessment/Plan: 1.  Partial SBO-per primary. Currently appears stable without  N & V. NPO per primary - had BM per pt, I suspect they will challenging with PO 2.  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 Did not feel that systemic treatment was needed at that time.  Unclear if that is related to recent events  3.  ESRD -  CCPD as noted above. PD cath was not functional in the setting of partial SBO and Felt best to give her belly a break,   S/p  Vas Cath and hemodialysis times one on 10/10. The key here is going to be how her belly progresses.  It is better- we are going to try tomorrow to eval for PD.  If  PD cath can work and if does, have her resume PD.  But if  cannot get PD cath to be functional, will need to convert vascath to [facility] and send out on HD to get PD cath fixed as OP by Teparra  4.  Hypertension/volume  - Home BP meds on hold D/T being NPO. BP is well controlled, no evidence of volume overload by exam.  5.  Anemia  - HGB trending down. Last OP Iron panel OK. Give Aranesp 25 mcg IV with HD.  6.  Metabolic bone disease -  Has been on Renvela packets  2.4grams TID AC and sensipar 30 mg daily. Corrected Ca 11.6 today- that has been a recent development - due to carcinoid ?  is better. Resume meds at a later date.       Louis Meckel    Labs: Basic Metabolic Panel: Recent Labs  Lab 08/28/19 1930 08/29/19 0754 08/30/19 0733  NA 133* 136 137  K 4.0 4.2 3.8  CL 89* 94* 95*  CO2 19* 20* 21*  GLUCOSE 196* 129* 140*  BUN 77* 89* 81*  CREATININE 9.55* 10.77* 8.96*  CALCIUM 10.7* 10.4* 10.0  PHOS  --   --  7.7*   Liver Function Tests: Recent Labs  Lab 08/28/19 1930 08/29/19 0754 08/30/19 0733  AST 13* 12*  --   ALT 14 12  --   ALKPHOS 93 84  --   BILITOT 0.9 0.9  --   PROT 7.3 6.3*  --   ALBUMIN 2.6* 2.5* 2.2*   Recent Labs  Lab 08/28/19 1930  LIPASE 22   No results for input(s): AMMONIA in the last 168 hours.  CBC: Recent Labs  Lab 08/28/19 1930 08/29/19 0754 08/30/19 0733  WBC 13.2* 15.2* 10.0  NEUTROABS 11.5*  --   --   HGB 10.1* 9.8* 9.2*  HCT 32.4* 30.2* 29.1*  MCV 98.5 95.3 99.0  PLT 376 391 371   Cardiac Enzymes: No results for input(s): CKTOTAL, CKMB, CKMBINDEX, TROPONINI in the last 168 hours. CBG: Recent Labs  Lab 08/30/19 1933 08/31/19 0033 08/31/19 0446 08/31/19 0748 08/31/19 1147  GLUCAP 96 92 90 99 113*    Iron Studies: No results for input(s): IRON, TIBC, TRANSFERRIN, FERRITIN in the last 72 hours. Studies/Results: Ir Fluoro Guide Cv Line Right  Result Date: 08/29/2019 INDICATION: 78 year old female with a history of renal dysfunction EXAM: IMAGE GUIDED PLACEMENT OF TEMPORARY hemodialysis catheter MEDICATIONS: None ANESTHESIA/SEDATION: None FLUOROSCOPY TIME:  Fluoroscopy Time: 0 minutes 6 seconds (1 mGy). COMPLICATIONS: None PROCEDURE: Informed written consent was obtained from the patient's family after a discussion of the risks, benefits, and alternatives to treatment. Questions regarding the procedure were encouraged and answered. The right neck was prepped with chlorhexidine  in a sterile fashion, and a sterile drape was applied covering the operative field. Maximum barrier sterile technique with sterile gowns and gloves were used for the procedure. A timeout was performed prior to the initiation of the procedure. A micropuncture kit was utilized to access the right internal jugular vein under direct, real-time ultrasound guidance after the overlying soft tissues were anesthetized with 1% lidocaine with epinephrine. Ultrasound image documentation was performed. The microwire was kinked to measure appropriate catheter length. A stiff glidewire was advanced to the level of the IVC. A 16 cm hemodialysis catheter was then placed over the wire. Final catheter positioning was confirmed and documented with a spot radiographic image. The catheter aspirates and flushes normally. The catheter was flushed with appropriate volume heparin dwells. Dressings were applied. The patient tolerated the procedure well without immediate post procedural complication. IMPRESSION: Status post right IJ placement of temporary hemodialysis catheter. Signed, Dulcy Fanny. Dellia Nims, RPVI Vascular and Interventional Radiology Specialists [facility] Radiology Electronically Signed   By: Corrie Mckusick D.O.   On: 08/29/2019 15:04   Ir US Guide Vasc Access Right  Result Date: 08/29/2019 INDICATION: 78 year old female with a history of renal dysfunction EXAM: IMAGE GUIDED PLACEMENT OF TEMPORARY hemodialysis catheter MEDICATIONS: None ANESTHESIA/SEDATION: None FLUOROSCOPY TIME:  Fluoroscopy Time: 0 minutes 6 seconds (1 mGy). COMPLICATIONS: None PROCEDURE: Informed written consent was obtained from the patient's family after a discussion of the risks, benefits, and alternatives to treatment. Questions regarding the procedure were encouraged and answered. The right neck was prepped with chlorhexidine in a sterile fashion, and a sterile drape was applied covering the operative field. Maximum barrier sterile technique with  sterile gowns and gloves were used for the procedure. A timeout was performed prior to the initiation of the procedure. A micropuncture kit was utilized to access the right internal jugular vein under direct, real-time ultrasound guidance after the overlying soft tissues were anesthetized with 1% lidocaine with epinephrine. Ultrasound image documentation was performed. The microwire was kinked to measure appropriate catheter length. A stiff glidewire was advanced to the level of the IVC. A 16 cm hemodialysis catheter was then placed over the wire. Final catheter positioning was confirmed and documented with a spot radiographic image. The catheter aspirates and flushes normally. The catheter was flushed with appropriate volume heparin dwells. Dressings were applied. The patient tolerated the procedure well without immediate post procedural complication. IMPRESSION: Status post right IJ placement  of temporary hemodialysis catheter. Signed, Dulcy Fanny. Dellia Nims, RPVI Vascular and Interventional Radiology Specialists [facility] Radiology Electronically Signed   By: Corrie Mckusick D.O.   On: 08/29/2019 15:04   Dg Abd Portable 1v  Result Date: 08/30/2019 CLINICAL DATA:  small bowel obstruction Update status EXAM: PORTABLE ABDOMEN - 1 VIEW COMPARISON:  Radiograph 08/29/2019 FINDINGS: No peritoneal dialysis catheter coils in the pelvis. No dilated loops of large or small bowel. Calcified leiomyoma noted. Postcholecystectomy. IMPRESSION: No evidence of bowel obstruction. Electronically Signed   By: Suzy Bouchard M.D.   On: 08/30/2019 10:07   Medications: Infusions:   Scheduled Medications: . Chlorhexidine Gluconate Cloth  6 each Topical Q0600  . darbepoetin (ARANESP) injection - DIALYSIS  25 mcg Intravenous Q Fri-HD  . heparin  5,000 Units Subcutaneous Q8H  . pantoprazole  40 mg Oral BID    have reviewed scheduled and prn medications.  Physical Exam: General:  Doing well Heart: RRR Lungs: mostly  clear Abdomen: soft, non tender at present  Extremities: no edema Dialysis Access: PD cath and temp vascath placed on 10/9    08/31/2019,1:09 PM  LOS: 3 days

## 2019-08-31 NOTE — Evaluation (Signed)
Physical Therapy Evaluation Patient Details Name: Eileen Perez MRN: [id number] DOB: 1940/11/28 Today's Date: 08/31/2019   History of Present Illness  Pt is 78 yo female presenting with small bowel obstruction. PMH including thyroid disease, pancreatic cyst, hypertension, hyperlipidemia, GERD, diabetes mellitus, ESRD.    Clinical Impression  Pt admitted with above diagnosis. PTA pt lived alone, independent. She has 1 step to enter her house. On eval she required supervision transfers and ambulation 400' with RW. Pt does not utilize an AD at baseline. Pt currently with functional limitations due to the deficits listed below (see PT Problem List). Pt will benefit from skilled PT to increase their independence and safety with mobility to allow discharge to the venue listed below.  PT to follow acutely. No follow up services indicated.     Follow Up Recommendations No PT follow up    Equipment Recommendations  None recommended by PT    Recommendations for Other Services       Precautions / Restrictions Precautions Precautions: Fall      Mobility  Bed Mobility Overal bed mobility: Modified Independent             General bed mobility comments: +rail, increased time  Transfers Overall transfer level: Needs assistance Equipment used: Ambulation equipment used Transfers: Sit to/from Stand Sit to Stand: Supervision         General transfer comment: supervision for safety  Ambulation/Gait Ambulation/Gait assistance: Supervision Gait Distance (Feet): 400 Feet Assistive device: Rolling walker (2 wheeled) Gait Pattern/deviations: Step-through pattern;Decreased stride length Gait velocity: mildly decreased Gait velocity interpretation: 1.31 - 2.62 ft/sec, indicative of limited community ambulator General Gait Details: supervision for safety, steady gait with RW. Pt has a RW at home from her previous TKAs (2012 and 2014) but at baseline does not use an AD.  Stairs             Wheelchair Mobility    Modified Rankin (Stroke Patients Only)       Balance Overall balance assessment: Needs assistance Sitting-balance support: No upper extremity supported;Feet supported Sitting balance-Leahy Scale: Good     Standing balance support: No upper extremity supported;During functional activity Standing balance-Leahy Scale: Fair Standing balance comment: amb in room without AD, RW for hallway amb                             Pertinent Vitals/Pain Pain Assessment: No/denies pain    Home Living Family/patient expects to be discharged to:: Private residence Living Arrangements: Alone Available Help at Discharge: Family;Available PRN/intermittently Type of Home: House Home Access: Stairs to enter   Entrance Stairs-Number of Steps: 1 Home Layout: One level Home Equipment: Walker - 2 wheels;Cane - single point;Bedside commode      Prior Function Level of Independence: Independent         Comments: Pt prepares simple meals for self. Uses [employer] for HD transportation.     Hand Dominance   Dominant Hand: Right    Extremity/Trunk Assessment   Upper Extremity Assessment Upper Extremity Assessment: Overall WFL for tasks assessed    Lower Extremity Assessment Lower Extremity Assessment: Overall WFL for tasks assessed    Cervical / Trunk Assessment Cervical / Trunk Assessment: Kyphotic  Communication   Communication: No difficulties  Cognition Arousal/Alertness: Awake/alert Behavior During Therapy: WFL for tasks assessed/performed Overall Cognitive Status: Within Functional Limits for tasks assessed  General Comments: pleasant and motivated      General Comments      Exercises     Assessment/Plan    PT Assessment Patient needs continued PT services  PT Problem List Decreased mobility;Decreased activity tolerance;Decreased balance;Decreased knowledge of use of DME       PT  Treatment Interventions DME instruction;Therapeutic activities;Gait training;Therapeutic exercise;Patient/family education;Stair training;Balance training;Functional mobility training    PT Goals (Current goals can be found in the Care Plan section)  Acute Rehab PT Goals Patient Stated Goal: get back to normal PT Goal Formulation: With patient Time For Goal Achievement: 09/14/19 Potential to Achieve Goals: Good    Frequency Min 3X/week   Barriers to discharge        Co-evaluation               AM-PAC PT "6 Clicks" Mobility  Outcome Measure Help needed turning from your back to your side while in a flat bed without using bedrails?: None Help needed moving from lying on your back to sitting on the side of a flat bed without using bedrails?: A Little Help needed moving to and from a bed to a chair (including a wheelchair)?: A Little Help needed standing up from a chair using your arms (e.g., wheelchair or bedside chair)?: A Little Help needed to walk in hospital room?: A Little Help needed climbing 3-5 steps with a railing? : A Little 6 Click Score: 19    End of Session Equipment Utilized During Treatment: Gait belt Activity Tolerance: Patient tolerated treatment well Patient left: in bed;with call bell/phone within reach Nurse Communication: Mobility status PT Visit Diagnosis: Difficulty in walking, not elsewhere classified (R26.2)    Time: 1014-1040 PT Time Calculation (min) (ACUTE ONLY): 26 min   Charges:   PT Evaluation $PT Eval Low Complexity: 1 Low PT Treatments $Gait Training: 8-22 mins        Lorrin Goodell, PT  Office # [phone] Pager [phone]   Lorriane Shire 08/31/2019, 11:40 AM

## 2019-08-31 NOTE — Progress Notes (Addendum)
PROGRESS NOTE    Ayano Douthitt  TKZ:[id number] DOB: 11-26-1940 DOA: 08/28/2019 PCP: Unk Pinto, MD   Brief Narrative: 78 year old with past medical history significant for pancreatic cyst, hypertension, hyperlipidemia, GERD, diabetes, end-stage renal disease on peritoneal dialysis who presents complaining of nausea and vomiting for the last 3 days prior to admission.  Patient peritoneal catheter was not working so she could not have peritoneal dialysis for the last couple of days. Evaluation in the ED CT scan with findings concerning for SBO and thickening of the antrum and pelvic area consistent with known gastric carcinoid tumor.  Assessment & Plan:   Active Problems:   Small bowel obstruction (HCC)   Protein-calorie malnutrition, severe   1-SBO; -Surgery following.  -hold on NG tube placement.  -Suppos.  Had multiples BM. She was only able to drink broth yesterday.  Denies abdominal pain.   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  GI consulted.  IV protonix.  GI considering repeating endoscopy when current condition resolved. Might need EUS>  Awaiting GI recommendation in regards to timing for repeating endoscopy.  3-ESRD on Peritoneal Dialysis: Nephrology consulted.  Underwent right IJ Temp HD catheter 10-09. Had 10-10  4-Severe malnutrition in the context of chronic illness: Continue with protein supplements.  5-Leukocytosis;  Resolved.  6-DM; Monitor CBG.  7-HTN; holding medication blood pressure soft.   Estimated body mass index is 21.24 kg/m as calculated from the following:   Height as of this encounter: 5' 1"  (1.549 m).   Weight as of this encounter: 51 kg.   DVT prophylaxis: Heparin Code Status: Full code Family Communication: Niece updated today.  Disposition Plan: Remain in the hospital  for management of SBO Consultants:   Nephrology  GI  Surgery  Procedures:   Temporary IJ hemodialysis catheter  Antimicrobials:    Subjective: Denies abdominal pain.  Had multiples BM Poor oral intake. She was able to eat broth yesterday.   Objective: Vitals:   08/30/19 1100 08/30/19 1125 08/30/19 1935 08/31/19 0448  BP: (!) 105/49 (!) 113/59 (!) 121/55 (!) 125/55  Pulse: 75 80 81 68  Resp: 18 18 17 16   Temp: 97.7 F (36.5 C) 97.8 F (36.6 C) 98.1 F (36.7 C) 98 F (36.7 C)  TempSrc: Oral Oral Oral Oral  SpO2: 99% 100% 100% 97%  Weight: 51 kg     Height:        Intake/Output Summary (Last 24 hours) at 08/31/2019 1149 Last data filed at 08/30/2019 1500 Gross per 24 hour  Intake 116.04 ml  Output --  Net 116.04 ml   Filed Weights   08/29/19 1758 08/30/19 0754 08/30/19 1100  Weight: 55.1 kg 51.1 kg 51 kg    Examination:  General exam: NAD Respiratory system: CTA Cardiovascular system: S 1, S 2 RRR Gastrointestinal system: BS present, soft, peritoneal catheter in place.  Central nervous system:Alert and oriented.  Extremities: Symmetric power.  Skin: no rashes.     Data Reviewed: I have personally reviewed following labs and imaging studies  CBC: Recent Labs  Lab 08/28/19 1930 08/29/19 0754 08/30/19 0733  WBC 13.2* 15.2* 10.0  NEUTROABS 11.5*  --   --   HGB 10.1* 9.8* 9.2*  HCT 32.4* 30.2* 29.1*  MCV 98.5 95.3 99.0  PLT 376 391 573   Basic Metabolic Panel: Recent Labs  Lab 08/28/19 1930 08/29/19 0754 08/30/19 0733  NA  133* 136 137  K 4.0 4.2 3.8  CL 89* 94* 95*  CO2 19* 20* 21*  GLUCOSE 196* 129* 140*  BUN 77* 89* 81*  CREATININE 9.55* 10.77* 8.96*  CALCIUM 10.7* 10.4* 10.0  PHOS  --   --  7.7*   GFR: Estimated Creatinine Clearance: 3.8 mL/min (A) (by C-G formula based on SCr of 8.96 mg/dL (H)). Liver Function Tests: Recent Labs  Lab 08/28/19 1930 08/29/19 0754 08/30/19 0733  AST 13* 12*  --   ALT 14 12  --   ALKPHOS  93 84  --   BILITOT 0.9 0.9  --   PROT 7.3 6.3*  --   ALBUMIN 2.6* 2.5* 2.2*   Recent Labs  Lab 08/28/19 1930  LIPASE 22   No results for input(s): AMMONIA in the last 168 hours. Coagulation Profile: No results for input(s): INR, PROTIME in the last 168 hours. Cardiac Enzymes: No results for input(s): CKTOTAL, CKMB, CKMBINDEX, TROPONINI in the last 168 hours. BNP (last 3 results) No results for input(s): PROBNP in the last 8760 hours. HbA1C: No results for input(s): HGBA1C in the last 72 hours. CBG: Recent Labs  Lab 08/30/19 1933 08/31/19 0033 08/31/19 0446 08/31/19 0748 08/31/19 1147  GLUCAP 96 92 90 99 113*   Lipid Profile: No results for input(s): CHOL, HDL, LDLCALC, TRIG, CHOLHDL, LDLDIRECT in the last 72 hours. Thyroid Function Tests: No results for input(s): TSH, T4TOTAL, FREET4, T3FREE, THYROIDAB in the last 72 hours. Anemia Panel: No results for input(s): VITAMINB12, FOLATE, FERRITIN, TIBC, IRON, RETICCTPCT in the last 72 hours. Sepsis Labs: No results for input(s): PROCALCITON, LATICACIDVEN in the last 168 hours.  Recent Results (from the past 240 hour(s))  SARS CORONAVIRUS 2 (TAT 6-24 HRS) Nasopharyngeal Nasopharyngeal Swab     Status: None   Collection Time: 08/28/19  9:25 PM   Specimen: Nasopharyngeal Swab  Result Value Ref Range Status   SARS Coronavirus 2 NEGATIVE NEGATIVE Final    Comment: (NOTE) SARS-CoV-2 target nucleic acids are NOT DETECTED. The SARS-CoV-2 RNA is generally detectable in upper and lower respiratory specimens during the acute phase of infection. Negative results do not preclude SARS-CoV-2 infection, do not rule out co-infections with other pathogens, and should not be used as the sole basis for treatment or other patient management decisions. Negative results must be combined with clinical observations, patient history, and epidemiological information. The expected result is Negative. Fact Sheet for  Patients: SugarRoll.be Fact Sheet for Healthcare Providers: https://www.woods-mathews.com/ This test is not yet approved or cleared by the [place] FDA and  has been authorized for detection and/or diagnosis of SARS-CoV-2 by FDA under an Emergency Use Authorization (EUA). This EUA will remain  in effect (meaning this test can be used) for the duration of the COVID-19 declaration under Section 56 4(b)(1) of the Act, 21 U.S.C. section 360bbb-3(b)(1), unless the authorization is terminated or revoked sooner. Performed at [facility], [place] [address], [place], [place] [address]   MRSA PCR Screening     Status: None   Collection Time: 08/29/19  1:34 AM   Specimen: Nasopharyngeal  Result Value Ref Range Status   MRSA by PCR NEGATIVE NEGATIVE Final    Comment:        The GeneXpert MRSA Assay (FDA approved for NASAL specimens only), is one component of a comprehensive MRSA colonization surveillance program. It is not intended to diagnose MRSA infection nor to guide or monitor treatment for MRSA infections. Performed at [facility]  Lab, [address], [place], [place] [address]          Radiology Studies: Ir Cyndy Freeze Guide Cv Line Right  Result Date: 08/29/2019 INDICATION: 78 year old female with a history of renal dysfunction EXAM: IMAGE GUIDED PLACEMENT OF TEMPORARY hemodialysis catheter MEDICATIONS: None ANESTHESIA/SEDATION: None FLUOROSCOPY TIME:  Fluoroscopy Time: 0 minutes 6 seconds (1 mGy). COMPLICATIONS: None PROCEDURE: Informed written consent was obtained from the patient's family after a discussion of the risks, benefits, and alternatives to treatment. Questions regarding the procedure were encouraged and answered. The right neck was prepped with chlorhexidine in a sterile fashion, and a sterile drape was applied covering the operative field. Maximum barrier sterile technique with sterile gowns and gloves were used for  the procedure. A timeout was performed prior to the initiation of the procedure. A micropuncture kit was utilized to access the right internal jugular vein under direct, real-time ultrasound guidance after the overlying soft tissues were anesthetized with 1% lidocaine with epinephrine. Ultrasound image documentation was performed. The microwire was kinked to measure appropriate catheter length. A stiff glidewire was advanced to the level of the IVC. A 16 cm hemodialysis catheter was then placed over the wire. Final catheter positioning was confirmed and documented with a spot radiographic image. The catheter aspirates and flushes normally. The catheter was flushed with appropriate volume heparin dwells. Dressings were applied. The patient tolerated the procedure well without immediate post procedural complication. IMPRESSION: Status post right IJ placement of temporary hemodialysis catheter. Signed, Dulcy Fanny. Dellia Nims, RPVI [facility] Radiology Electronically Signed   By: Corrie Mckusick D.O.   On: 08/29/2019 15:04   Ir US Guide Vasc Access Right  Result Date: 08/29/2019 INDICATION: 78 year old female with a history of renal dysfunction EXAM: IMAGE GUIDED PLACEMENT OF TEMPORARY hemodialysis catheter MEDICATIONS: None ANESTHESIA/SEDATION: None FLUOROSCOPY TIME:  Fluoroscopy Time: 0 minutes 6 seconds (1 mGy). COMPLICATIONS: None PROCEDURE: Informed written consent was obtained from the patient's family after a discussion of the risks, benefits, and alternatives to treatment. Questions regarding the procedure were encouraged and answered. The right neck was prepped with chlorhexidine in a sterile fashion, and a sterile drape was applied covering the operative field. Maximum barrier sterile technique with sterile gowns and gloves were used for the procedure. A timeout was performed prior to the initiation of the procedure. A micropuncture kit was utilized to access  the right internal jugular vein under direct, real-time ultrasound guidance after the overlying soft tissues were anesthetized with 1% lidocaine with epinephrine. Ultrasound image documentation was performed. The microwire was kinked to measure appropriate catheter length. A stiff glidewire was advanced to the level of the IVC. A 16 cm hemodialysis catheter was then placed over the wire. Final catheter positioning was confirmed and documented with a spot radiographic image. The catheter aspirates and flushes normally. The catheter was flushed with appropriate volume heparin dwells. Dressings were applied. The patient tolerated the procedure well without immediate post procedural complication. IMPRESSION: Status post right IJ placement of temporary hemodialysis catheter. Signed, Dulcy Fanny. Dellia Nims, RPVI [facility] Radiology Electronically Signed   By: Corrie Mckusick D.O.   On: 08/29/2019 15:04   Dg Abd Portable 1v  Result Date: 08/30/2019 CLINICAL DATA:  small bowel obstruction Update status EXAM: PORTABLE ABDOMEN - 1 VIEW COMPARISON:  Radiograph 08/29/2019 FINDINGS: No peritoneal dialysis catheter coils in the pelvis. No dilated loops of large or small bowel. Calcified leiomyoma noted. Postcholecystectomy. IMPRESSION: No evidence of  bowel obstruction. Electronically Signed   By: Suzy Bouchard M.D.   On: 08/30/2019 10:07        Scheduled Meds:  Chlorhexidine Gluconate Cloth  6 each Topical Q0600   darbepoetin (ARANESP) injection - DIALYSIS  25 mcg Intravenous Q Fri-HD   heparin  5,000 Units Subcutaneous Q8H   pantoprazole  40 mg Oral BID   Continuous Infusions:    LOS: 3 days    Time spent: 35 minutes.     Elmarie Shiley, MD [facility] Pager [phone]  If 7PM-7AM, please contact night-coverage [email or web address] Password [email or web address] 08/31/2019, 11:49 AM

## 2019-09-01 DIAGNOSIS — R82998 Other abnormal findings in urine: Secondary | ICD-10-CM | POA: Diagnosis not present

## 2019-09-01 DIAGNOSIS — E119 Type 2 diabetes mellitus without complications: Secondary | ICD-10-CM | POA: Diagnosis not present

## 2019-09-01 DIAGNOSIS — Z4932 Encounter for adequacy testing for peritoneal dialysis: Secondary | ICD-10-CM | POA: Diagnosis not present

## 2019-09-01 DIAGNOSIS — D631 Anemia in chronic kidney disease: Secondary | ICD-10-CM | POA: Diagnosis not present

## 2019-09-01 DIAGNOSIS — N2589 Other disorders resulting from impaired renal tubular function: Secondary | ICD-10-CM | POA: Diagnosis not present

## 2019-09-01 DIAGNOSIS — Z992 Dependence on renal dialysis: Secondary | ICD-10-CM | POA: Diagnosis not present

## 2019-09-01 DIAGNOSIS — Z23 Encounter for immunization: Secondary | ICD-10-CM | POA: Diagnosis not present

## 2019-09-01 DIAGNOSIS — E876 Hypokalemia: Secondary | ICD-10-CM | POA: Diagnosis not present

## 2019-09-01 DIAGNOSIS — N2581 Secondary hyperparathyroidism of renal origin: Secondary | ICD-10-CM | POA: Diagnosis not present

## 2019-09-01 DIAGNOSIS — D509 Iron deficiency anemia, unspecified: Secondary | ICD-10-CM | POA: Diagnosis not present

## 2019-09-01 DIAGNOSIS — N186 End stage renal disease: Secondary | ICD-10-CM | POA: Diagnosis not present

## 2019-09-01 LAB — CBC
HCT: 31.1 % — ABNORMAL LOW (ref 36.0–46.0)
Hemoglobin: 9.9 g/dL — ABNORMAL LOW (ref 12.0–15.0)
MCH: 31.3 pg (ref 26.0–34.0)
MCHC: 31.8 g/dL (ref 30.0–36.0)
MCV: 98.4 fL (ref 80.0–100.0)
Platelets: 339 10*3/uL (ref 150–400)
RBC: 3.16 MIL/uL — ABNORMAL LOW (ref 3.87–5.11)
RDW: 15.6 % — ABNORMAL HIGH (ref 11.5–15.5)
WBC: 11.8 10*3/uL — ABNORMAL HIGH (ref 4.0–10.5)
nRBC: 0.7 % — ABNORMAL HIGH (ref 0.0–0.2)

## 2019-09-01 LAB — BASIC METABOLIC PANEL
Anion gap: 16 — ABNORMAL HIGH (ref 5–15)
BUN: 60 mg/dL — ABNORMAL HIGH (ref 8–23)
CO2: 23 mmol/L (ref 22–32)
Calcium: 10.2 mg/dL (ref 8.9–10.3)
Chloride: 99 mmol/L (ref 98–111)
Creatinine, Ser: 8.17 mg/dL — ABNORMAL HIGH (ref 0.44–1.00)
GFR calc Af Amer: 5 mL/min — ABNORMAL LOW (ref >60)
GFR calc non Af Amer: 4 mL/min — ABNORMAL LOW (ref >60)
Glucose, Bld: 100 mg/dL — ABNORMAL HIGH (ref 70–99)
Potassium: 3.6 mmol/L (ref 3.5–5.1)
Sodium: 138 mmol/L (ref 135–145)

## 2019-09-01 LAB — GLUCOSE, CAPILLARY
Glucose-Capillary: 104 mg/dL — ABNORMAL HIGH (ref 70–99)
Glucose-Capillary: 105 mg/dL — ABNORMAL HIGH (ref 70–99)
Glucose-Capillary: 131 mg/dL — ABNORMAL HIGH (ref 70–99)
Glucose-Capillary: 155 mg/dL — ABNORMAL HIGH (ref 70–99)
Glucose-Capillary: 168 mg/dL — ABNORMAL HIGH (ref 70–99)
Glucose-Capillary: 89 mg/dL (ref 70–99)

## 2019-09-01 MED ORDER — RENA-VITE PO TABS
1.0000 | ORAL_TABLET | Freq: Every day | ORAL | Status: DC
  Administered 2019-09-01: 1 via ORAL
  Filled 2019-09-01 (×3): qty 1

## 2019-09-01 MED ORDER — ENSURE ENLIVE PO LIQD
237.0000 mL | Freq: Two times a day (BID) | ORAL | Status: DC
  Administered 2019-09-02 – 2019-09-03 (×3): 237 mL via ORAL

## 2019-09-01 NOTE — TOC Initial Note (Addendum)
Transition of Care [facility]) - Initial/Assessment Note    Patient Details  Name: Eileen Perez MRN: [id number] Date of Birth: March 12, 1941  Transition of [facility]) CM/SW Contact:    Marilu Favre, RN Phone Number: 09/01/2019, 10:21 AM  Clinical Narrative:      [id number] Patient would like [facility]. Tommi Rumps with [facility] accepted referral.              Confirmed face sheet information with patient. Patient wants HHRN to "check on Her"  Provided medicare.gov list of home health agencies. Explained may need to call several. Patient will research home health agencies. Patient wants NCM to return this afternoon for her decision.   Expected Discharge Plan: [place] Barriers to Discharge: Continued Medical Work up   Patient Goals and CMS Choice Patient states their goals for this hospitalization and ongoing recovery are:: to go home CMS Medicare.gov Compare Post Acute Care list provided to:: Patient Choice offered to / list presented to : Patient  Expected Discharge Plan and Services Expected Discharge Plan: [place]   Discharge Planning Services: CM Consult Post Acute Care Choice: [place] arrangements for the past 2 months: Single Family Home                 DME Arranged: N/A         HH Arranged: RN          Prior Living Arrangements/Services Living arrangements for the past 2 months: Single Family Home Lives with:: Self Patient language and need for interpreter reviewed:: Yes        Need for Family Participation in Patient Care: Yes (Comment) Care giver support system in place?: Yes (comment)   Criminal Activity/Legal Involvement Pertinent to Current Situation/Hospitalization: No - Comment as needed  Activities of Daily Living Home Assistive Devices/Equipment: Cane (specify quad or straight) ADL Screening (condition at time of admission) Patient's cognitive ability adequate to safely complete daily activities?: Yes Is the  patient deaf or have difficulty hearing?: No Does the patient have difficulty seeing, even when wearing glasses/contacts?: No Does the patient have difficulty concentrating, remembering, or making decisions?: No Patient able to express need for assistance with ADLs?: Yes Does the patient have difficulty dressing or bathing?: No Independently performs ADLs?: Yes (appropriate for developmental age) Does the patient have difficulty walking or climbing stairs?: No Weakness of Legs: Both Weakness of Arms/Hands: Both  Permission Sought/Granted   Permission granted to share information with : No              Emotional Assessment Appearance:: Appears stated age Attitude/Demeanor/Rapport: Engaged Affect (typically observed): Accepting Orientation: : Oriented to Self, Oriented to Place, Oriented to  Time, Oriented to Situation Alcohol / Substance Use: Not Applicable Psych Involvement: No (comment)  Admission diagnosis:  Small bowel obstruction ([place]) [K56.609] Patient Active Problem List   Diagnosis Date Noted  . Protein-calorie malnutrition, severe 08/29/2019  . Small bowel obstruction ([place]) 08/28/2019  . Schatzki's ring of distal esophagus 06/23/2019  . 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 History of colon polyps 06/23/2019  . Enrolled in chronic care management 02/20/2019  . Aortic atherosclerosis ([place]) 02/13/2018  . 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 Overweight (BMI 25.0-29.9) 10/31/2015  . ESRD (end stage renal disease) ([place]) 10/31/2015  . OA (osteoarthritis) of hip 05/26/2015  . Secondary hyperparathyroidism ([place]) 04/16/2014  . Vitamin D deficiency 04/16/2014  . Hyperlipidemia, mixed 04/16/2014  . Medication management 04/16/2014  . Anemia  of chronic Renal Dz   . Pancreatic cyst   . GERD   . Gout   . Osteopenia   . Essential hypertension 06/10/2013  . DJD 06/09/2013   PCP:  Unk Pinto, MD Pharmacy:   [facility] [place], [place] [address] [place] [address] Phone: [phone] Fax: [place], [facility] [address] [place] [address] [place] [address] Phone: [phone] Fax: [phone]     Social Determinants of Health ([place]) Interventions    Readmission Risk Interventions No flowsheet data found.

## 2019-09-01 NOTE — Progress Notes (Signed)
OT Cancellation Note  Patient Details Name: Eileen Perez MRN: [id number] DOB: 08/05/1941   Cancelled Treatment:    Reason Eval/Treat Not Completed: Patient at procedure or test/ unavailable;Other (comment) pt currently on peritoneal HD. Will check back as time allows.   [place], COTA/L Acute Rehabilitation Services Ewa Gentry 09/01/2019, 8:42 AM

## 2019-09-01 NOTE — Progress Notes (Signed)
PROGRESS NOTE    Eileen Perez  YTK:[id number] DOB: 01/19/1941 DOA: 08/28/2019 PCP: Unk Pinto, MD   Brief Narrative: 78 year old with past medical history significant for pancreatic cyst, hypertension, hyperlipidemia, GERD, diabetes, end-stage renal disease on peritoneal dialysis who presents complaining of nausea and vomiting for the last 3 days prior to admission.  Patient peritoneal catheter was not working so she could not have peritoneal dialysis for the last couple of days. Evaluation in the ED CT scan with findings concerning for SBO and thickening of the antrum and pelvic area consistent with known gastric carcinoid tumor.  Assessment & Plan:   Active Problems:   Small bowel obstruction (HCC)   Protein-calorie malnutrition, severe   1-SBO; -Surgery consulted and help with management.  -never required  NG tube placement.  -Received Suppos.  -abdominal pain resolved, tolerating clears, plan to advance to full liquid diet.   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  GI consulted.  IV protonix.  GI considering repeating endoscopy when current condition resolved. Might need EUS>  Endoscopy tomorrow.   3-ESRD on Peritoneal Dialysis: Nephrology consulted.  Underwent right IJ Temp HD catheter 10-09. Had HD 10-10 Currently receiving peritoneal HD, catheter working.   4-Severe malnutrition in the context of chronic illness: Continue with protein supplements.  5-Leukocytosis;  Resolved.  6-DM; Monitor CBG.  7-HTN; holding medication blood pressure soft.   Estimated body mass index is 21.54 kg/m as calculated from the following:   Height as of this encounter: 5\' 1"  (1.549 m).   Weight as of this encounter: 51.7 kg.   DVT prophylaxis: Heparin Code Status: Full code Family Communication: Niece updated 10-12  Disposition Plan: Remain in the hospital for management of SBO Consultants:   Nephrology  GI  Surgery  Procedures:   Temporary IJ hemodialysis catheter  Antimicrobials:    Subjective: Feeling better, denies abdominal pain. Getting Peritoneal Dialysis currently.  Tolerating full liquid.    Objective: Vitals:   08/31/19 1400 09/01/19 0352 09/01/19 0833 09/01/19 1340  BP: 134/69 114/68 127/73 122/78  Pulse: 80 75 82 74  Resp: 17 18 18 18   Temp: 98.5 F (36.9 C) 98.3 F (36.8 C) 98 F (36.7 C) 98.1 F (36.7 C)  TempSrc: Oral Oral Oral Oral  SpO2: 100% 99% 100% 100%  Weight:   51.7 kg   Height:       No intake or output data in the 24 hours ending 09/01/19 1436 Filed Weights   08/30/19 0754 08/30/19 1100 09/01/19 0833  Weight: 51.1 kg 51 kg 51.7 kg    Examination:  General exam: NAD Respiratory system: CTA Cardiovascular system: S 1, S 2 RRR Gastrointestinal system: BS present, soft, NT, peritoneal catheter in place Central nervous system: Alert, non focal.  Extremities: Symmetric power.  Skin: No rashes.     Data Reviewed: I have personally reviewed following labs and imaging studies  CBC: Recent Labs  Lab 08/28/19 1930 08/29/19 0754 08/30/19 0733 09/01/19 0324  WBC 13.2* 15.2* 10.0 11.8*  NEUTROABS 11.5*  --   --   --   HGB 10.1* 9.8* 9.2* 9.9*  HCT 32.4* 30.2* 29.1* 31.1*  MCV 98.5 95.3 99.0 98.4  PLT 376 391 371 751   Basic Metabolic Panel: Recent Labs  Lab 08/28/19 1930 08/29/19 0754 08/30/19 0733 09/01/19 0324  NA 133* 136 137 138  K 4.0 4.2 3.8 3.6  CL 89* 94* 95* 99  CO2 19* 20* 21* 23  GLUCOSE 196* 129* 140* 100*  BUN 77* 89* 81* 60*  CREATININE 9.55* 10.77* 8.96* 8.17*  CALCIUM 10.7* 10.4* 10.0 10.2  PHOS  --   --  7.7*  --    GFR: Estimated Creatinine Clearance: 4.2 mL/min (A) (by C-G formula based on SCr of 8.17 mg/dL (H)). Liver Function Tests: Recent Labs  Lab 08/28/19 1930 08/29/19 0754 08/30/19 0733  AST 13*  12*  --   ALT 14 12  --   ALKPHOS 93 84  --   BILITOT 0.9 0.9  --   PROT 7.3 6.3*  --   ALBUMIN 2.6* 2.5* 2.2*   Recent Labs  Lab 08/28/19 1930  LIPASE 22   No results for input(s): AMMONIA in the last 168 hours. Coagulation Profile: No results for input(s): INR, PROTIME in the last 168 hours. Cardiac Enzymes: No results for input(s): CKTOTAL, CKMB, CKMBINDEX, TROPONINI in the last 168 hours. BNP (last 3 results) No results for input(s): PROBNP in the last 8760 hours. HbA1C: No results for input(s): HGBA1C in the last 72 hours. CBG: Recent Labs  Lab 08/31/19 2021 09/01/19 0017 09/01/19 0351 09/01/19 0814 09/01/19 1227  GLUCAP 113* 105* 104* 89 155*   Lipid Profile: No results for input(s): CHOL, HDL, LDLCALC, TRIG, CHOLHDL, LDLDIRECT in the last 72 hours. Thyroid Function Tests: No results for input(s): TSH, T4TOTAL, FREET4, T3FREE, THYROIDAB in the last 72 hours. Anemia Panel: No results for input(s): VITAMINB12, FOLATE, FERRITIN, TIBC, IRON, RETICCTPCT in the last 72 hours. Sepsis Labs: No results for input(s): PROCALCITON, LATICACIDVEN in the last 168 hours.  Recent Results (from the past 240 hour(s))  SARS CORONAVIRUS 2 (TAT 6-24 HRS) Nasopharyngeal Nasopharyngeal Swab     Status: None   Collection Time: 08/28/19  9:25 PM   Specimen: Nasopharyngeal Swab  Result Value Ref Range Status   SARS Coronavirus 2 NEGATIVE NEGATIVE Final    Comment: (NOTE) SARS-CoV-2 target nucleic acids are NOT DETECTED. The SARS-CoV-2 RNA is generally detectable in upper and lower respiratory specimens during the acute phase of infection. Negative results do not preclude SARS-CoV-2 infection, do not rule out co-infections with other pathogens, and should not be used as the sole basis for treatment or other patient management decisions. Negative results must be combined with clinical observations, patient history, and epidemiological information. The expected result is Negative.  Fact Sheet for Patients: SugarRoll.be Fact Sheet for Healthcare Providers: https://www.woods-mathews.com/ This test is not yet approved or cleared by the [place] FDA and  has been authorized for detection and/or diagnosis of SARS-CoV-2 by FDA under an Emergency Use Authorization (EUA). This EUA will remain  in effect (meaning this test can be used) for the duration of the COVID-19 declaration under Section 56 4(b)(1) of the Act, 21 U.S.C. section 360bbb-3(b)(1), unless the authorization is terminated or revoked sooner. Performed at [facility], [place] [address], [place], [place] [address]   MRSA PCR Screening     Status: None   Collection Time: 08/29/19  1:34 AM   Specimen: Nasopharyngeal  Result Value Ref Range Status   MRSA by PCR NEGATIVE NEGATIVE Final    Comment:        The GeneXpert MRSA Assay (FDA approved for NASAL specimens only), is one component of a comprehensive MRSA colonization surveillance program. It is not intended to diagnose MRSA infection nor to guide or monitor treatment for MRSA infections. Performed at [facility]  [facility], [address], [place], [place] [address]          Radiology Studies: No results found.      Scheduled Meds: . Chlorhexidine Gluconate Cloth  6 each Topical Q0600  . darbepoetin (ARANESP) injection - DIALYSIS  25 mcg Intravenous Q Fri-HD  . gentamicin cream  1 application Topical Daily  . heparin  5,000 Units Subcutaneous Q8H  . pantoprazole  40 mg Oral BID   Continuous Infusions: . dialysis solution 1.5% low-MG/low-CA       LOS: 4 days    Time spent: 35 minutes.     Elmarie Shiley, MD [facility] Pager [phone]  If 7PM-7AM, please contact night-coverage [email or web address] Password [email or web address] 09/01/2019, 2:36 PM

## 2019-09-01 NOTE — Progress Notes (Addendum)
[facility]/Trauma Progress Note      Assessment/Plan Hx of gastric carcinoid tumor - followed by [facility].  They plan for upper endoscopy tomorrow 10/13 ESRD on PD  PSBO - today pt is nontender on exam and not distended, no vomiting - if pt gets nauseated or vomits she will need NGT - AXR on 10/10 showed no evidence of bowel obstruction - Pt is having BM's and flatus. No vomiting  FEN:FLD and advance to soft as tolerated VTE: SCD's,heparin PI:RJJO, WBC 11.8, afebrile Foley:none Follow up:TBD  Plan: advance diet as tolerated. PSBO appears to be resolving    LOS: 4 days    Subjective: CC: no complaints  Pt currently undergoing PD. She had some indigestion and abdominal discomfort last evening but this has resolved. She is having BM's and flatus. No vomiting. Tolerating clears.   Objective: Vital signs in last 24 hours: Temp:  [98 F (36.7 C)-98.5 F (36.9 C)] 98 F (36.7 C) (10/12 0833) Pulse Rate:  [75-82] 82 (10/12 0833) Resp:  [17-18] 18 (10/12 0833) BP: (114-134)/(68-73) 127/73 (10/12 0833) SpO2:  [99 %-100 %] 100 % (10/12 0833) Weight:  [51.7 kg] 51.7 kg (10/12 0833) Last BM Date: 08/30/19  Intake/Output from previous day: 10/11 0701 - 10/12 0700 In: 300 [P.O.:300] Out: -  Intake/Output this shift: No intake/output data recorded.  PE: Gen:  Alert, NAD, pleasant, cooperative Pulm:  Rate and effort normal Abd: Soft, NT/ND, +BS, no HSM, PD cath in place Skin: no rashes noted, warm and dry   Anti-infectives: Anti-infectives (From admission, onward)   None      Lab Results:  Recent Labs    08/30/19 0733 09/01/19 0324  WBC 10.0 11.8*  HGB 9.2* 9.9*  HCT 29.1* 31.1*  PLT 371 339   BMET Recent Labs    08/30/19 0733 09/01/19 0324  NA 137 138  K 3.8 3.6  CL 95* 99  CO2 21* 23  GLUCOSE 140* 100*  BUN 81* 60*  CREATININE 8.96* 8.17*  CALCIUM 10.0 10.2   PT/INR No results for input(s): LABPROT, INR in the last 72  hours. CMP     Component Value Date/Time   NA 138 09/01/2019 0324   K 3.6 09/01/2019 0324   CL 99 09/01/2019 0324   CO2 23 09/01/2019 0324   GLUCOSE 100 (H) 09/01/2019 0324   BUN 60 (H) 09/01/2019 0324   CREATININE 8.17 (H) 09/01/2019 0324   CREATININE 8.37 (H) 06/24/2019 1216   CALCIUM 10.2 09/01/2019 0324   CALCIUM 9.0 12/10/2015 0915   PROT 6.3 (L) 08/29/2019 0754   ALBUMIN 2.2 (L) 08/30/2019 0733   AST 12 (L) 08/29/2019 0754   ALT 12 08/29/2019 0754   ALKPHOS 84 08/29/2019 0754   BILITOT 0.9 08/29/2019 0754   GFRNONAA 4 (L) 09/01/2019 0324   GFRNONAA 4 (L) 06/24/2019 1216   GFRAA 5 (L) 09/01/2019 0324   GFRAA 5 (L) 06/24/2019 1216   Lipase     Component Value Date/Time   LIPASE 22 08/28/2019 1930    Studies/Results: No results found.   Kalman Drape, [facility] Surgery Pager [phone] Cristine Polio, & Friday 7:00am - 4:30pm Thursdays 7:00am -11:30am

## 2019-09-01 NOTE — Progress Notes (Signed)
[facility] Progress Note  Eileen Perez 78 y.o. 26-May-1941  CC: Small bowel obstruction, abnormal CT scan showing antral thickening   Subjective: Patient feeling better.  Tolerating diet.  Denies nausea vomiting.  Denies any bleeding episodes.  ROS : Afebrile.  Negative for chest pain and shortness of breath.   Objective: Vital signs in last 24 hours: Vitals:   08/31/19 1400 09/01/19 0352  BP: 134/69 114/68  Pulse: 80 75  Resp: 17 18  Temp: 98.5 F (36.9 C) 98.3 F (36.8 C)  SpO2: 100% 99%    Physical Exam:  General:  Alert, cooperative, no distress, appears stated age  Head:  Normocephalic, without obvious abnormality, atraumatic  Eyes:  , EOM's intact,   Lungs:   Clear to auscultation bilaterally, respirations unlabored  Heart:  Regular rate and rhythm, S1, S2 normal  Abdomen:    Mild abdominal distention, nontender, bowel sounds present.  No peritoneal signs  Extremities: Extremities normal, atraumatic, no  edema       Lab Results: Recent Labs    08/30/19 0733 09/01/19 0324  NA 137 138  K 3.8 3.6  CL 95* 99  CO2 21* 23  GLUCOSE 140* 100*  BUN 81* 60*  CREATININE 8.96* 8.17*  CALCIUM 10.0 10.2  PHOS 7.7*  --    Recent Labs    08/30/19 0733  ALBUMIN 2.2*   Recent Labs    08/30/19 0733 09/01/19 0324  WBC 10.0 11.8*  HGB 9.2* 9.9*  HCT 29.1* 31.1*  MCV 99.0 98.4  PLT 371 339   No results for input(s): LABPROT, INR in the last 72 hours.    Assessment/Plan: Partial small bowel obstruction.  Improving Abnormal CT scan showing diffuse wall thickening in the antrum and pyloric region of the stomach. Gastric carcinoid and adenoma.  EGD in June 2020 showed 15 mm polyp in the gastric body/near antrum.  It was removed with hot snare.  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  Patient also had few nodules in the duodenum.  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 End-stage renal disease.  On peritoneal dialysis. Chronic anemia  Recommendations ------------------------ -Plan for EGD tomorrow. -Keep n.p.o. past midnight  Risks (bleeding, infection, bowel perforation that could require surgery, sedation-related changes in cardiopulmonary systems), benefits (identification and possible treatment of source of symptoms, exclusion of certain causes of symptoms), and alternatives (watchful waiting, radiographic imaging studies, empiric medical treatment)  were explained to patient in detail and patient wishes to proceed.   Otis Brace MD, [place] 09/01/2019, 8:53 AM  Contact #  [phone]

## 2019-09-01 NOTE — Progress Notes (Signed)
Nutrition Follow-up  DOCUMENTATION CODES:   Severe malnutrition in context of chronic illness  INTERVENTION:   -Renal MVI daily -Ensure Enlive po BID, each supplement provides 350 kcal and 20 grams of protein  NUTRITION DIAGNOSIS:   Severe Malnutrition related to chronic illness(ESRD on PD) as evidenced by energy intake < or equal to 75% for > or equal to 1 month, moderate fat depletion, severe fat depletion, moderate muscle depletion, severe muscle depletion, percent weight loss.  Ongoing  GOAL:   Patient will meet greater than or equal to 90% of their needs  Progressing   MONITOR:   PO intake, Supplement acceptance, Diet advancement, Labs, Weight trends, Skin, I & O's  REASON FOR ASSESSMENT:   Malnutrition Screening Tool    ASSESSMENT:   Eileen Perez  is a 78 y.o. female, with history of thyroid disease, pancreatic cyst, hypertension, hyperlipidemia, GERD, diabetes mellitus, ESRD, presents to the ED today with a chief complaint of vomiting.  Patient reports that she has been vomiting for more than 3 days.  She only drinks liquids and does not eat at home for more than a week.  Still she continues to have multiple episodes of emesis each day.  Nonbloody nonbilious emesis.  She reports that before this she did have loose stools for several days.  She cannot quantify exact amount of days.  Loose stool was not watery, and nonbloody.  Patient reports that the symptoms are associated with lower abdominal pain.  It is lower abdomen and straight across.  Not on one side more than the other.  Patient does not make urine.  Patient has no vaginal bleeding.  Patient does report that she has been constipated.  Patient does report 2 abdominal surgeries in her history.  The first is a gallbladder, the second with a pancreatic cyst.  10/9- s/p rt IJ temporary HD cath placement  10/10- advanced to clear liquids  10/12- advanced to full liquids   Reviewed I/O's: +300 ml x 24 hours and +765  ml since admission  Pt resting quietly at time of visit. RD did not disturb.   Pt with good appetite; noted meal completion 90%. Per general surgery plans to advance diet as tolerated.   Per nephrology notes, plan to resume PD as SBO has resolved.   Labs reviewed: CBGS: 89-105.   Diet Order:   Diet Order            Diet full liquid Room service appropriate? Yes; Fluid consistency: Thin  Diet effective now              EDUCATION NEEDS:   Education needs have been addressed  Skin:  Skin Assessment: Reviewed RN Assessment  Last BM:  08/30/19  Height:   Ht Readings from Last 1 Encounters:  08/28/19 5\' 1"  (1.549 m)    Weight:   Wt Readings from Last 1 Encounters:  09/01/19 51.7 kg    Ideal Body Weight:  47.7 kg  BMI:  Body mass index is 21.54 kg/m.  Estimated Nutritional Needs:   Kcal:  1650-1850  Protein:  80-95 grams  Fluid:  1000 ml + UOP    Devlynn Knoff A. Jimmye Norman, RD, LDN, [place] Registered Dietitian II Certified Diabetes Care and Education Specialist Pager: [phone] After hours Pager: [phone]

## 2019-09-01 NOTE — Progress Notes (Signed)
[facility] Progress Note   Subjective: Seen while PD fluid dwelling, no issues with drain/fill. She feels much better, denies nausea.   Objective Vitals:   08/31/19 0448 08/31/19 1400 09/01/19 0352 09/01/19 0833  BP: (!) 125/55 134/69 114/68 127/73  Pulse: 68 80 75 82  Resp: 16 17 18 18   Temp: 98 F (36.7 C) 98.5 F (36.9 C) 98.3 F (36.8 C) 98 F (36.7 C)  TempSrc: Oral Oral Oral Oral  SpO2: 97% 100% 99% 100%  Weight:    51.7 kg  Height:       Physical Exam General: Pleasant older female in NAD Heart: S1,S2 RRR Lungs: CTAB A/P Abdomen: Soft, NT, PD in dwell cycle, no issues.  Extremities: No LE edema Dialysis Access: RIJ Vas Cath-drsg intact. PD cath with drain lines connected, treatment in progress.    Additional Objective Labs: Basic Metabolic Panel: Recent Labs  Lab 08/29/19 0754 08/30/19 0733 09/01/19 0324  NA 136 137 138  K 4.2 3.8 3.6  CL 94* 95* 99  CO2 20* 21* 23  GLUCOSE 129* 140* 100*  BUN 89* 81* 60*  CREATININE 10.77* 8.96* 8.17*  CALCIUM 10.4* 10.0 10.2  PHOS  --  7.7*  --    Liver Function Tests: Recent Labs  Lab 08/28/19 1930 08/29/19 0754 08/30/19 0733  AST 13* 12*  --   ALT 14 12  --   ALKPHOS 93 84  --   BILITOT 0.9 0.9  --   PROT 7.3 6.3*  --   ALBUMIN 2.6* 2.5* 2.2*   Recent Labs  Lab 08/28/19 1930  LIPASE 22   CBC: Recent Labs  Lab 08/28/19 1930 08/29/19 0754 08/30/19 0733 09/01/19 0324  WBC 13.2* 15.2* 10.0 11.8*  NEUTROABS 11.5*  --   --   --   HGB 10.1* 9.8* 9.2* 9.9*  HCT 32.4* 30.2* 29.1* 31.1*  MCV 98.5 95.3 99.0 98.4  PLT 376 391 371 339   Blood Culture No results found for: SDES, SPECREQUEST, CULT, REPTSTATUS  Cardiac Enzymes: No results for input(s): CKTOTAL, CKMB, CKMBINDEX, TROPONINI in the last 168 hours. CBG: Recent Labs  Lab 08/31/19 1613 08/31/19 2021 09/01/19 0017 09/01/19 0351 09/01/19 0814  GLUCAP 112* 113* 105* 104* 89   Iron Studies: No results for input(s): IRON,  TIBC, TRANSFERRIN, FERRITIN in the last 72 hours. @lablastinr3 @ Studies/Results: No results found. Medications: . dialysis solution 1.5% low-MG/low-CA     . Chlorhexidine Gluconate Cloth  6 each Topical Q0600  . darbepoetin (ARANESP) injection - DIALYSIS  25 mcg Intravenous Q Fri-HD  . gentamicin cream  1 application Topical Daily  . heparin  5,000 Units Subcutaneous Q8H  . pantoprazole  40 mg Oral BID     Dialysis Orders: CCPD, 7X Week, EDW 53 (kg) Ca 2.5 (mEq/L) Mg 0.5 (mEq/L) Dextrose 1.5; 2.5; 4.25 %, # Exchanges 5, fill vol 2500 mL, Dwell Time 2 hrs 0 min,    Assessment/Plan: 1. Partial SBO-Appears to be resolving. Has been advanced to full liquid diet. Surgery/GI following.  2. 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 Did not feel that systemic treatment was needed at that time.  Unclear if that is related to recent events. Scheduled for endoscopy tomorrow.  3. ESRD - CCPD as noted above. PD cath was not functional in the setting of partial SBO and Felt best to give her belly a break,   S/p  Vas Cath and hemodialysis times one on 10/10. PD catheter now functioning,  will have full treatment now and if no issues, will have vas cath removed and she can continue PD as previously ordered.  4. Hypertension/volume - Home BP meds on hold D/T being NPO. BP is well controlled, no evidence of volume overload by exam.  5. Anemia - HBG 9.9 today. Did NOT receive ESA as ordered Friday. Follow HGB.  6. Metabolic bone disease - Has been on Renvela packets 2.4grams TID AC and sensipar 30 mg daily. Corrected Ca stable at 11.6- that has been a recent development - due to carcinoid ? is better. Resume meds at a later date.   Kolby Myung H. Akeel Reffner NP-C 09/01/2019, 10:50 AM  [employer] [phone]

## 2019-09-02 ENCOUNTER — Inpatient Hospital Stay (HOSPITAL_COMMUNITY): Payer: Medicare Other | Admitting: Certified Registered Nurse Anesthetist

## 2019-09-02 ENCOUNTER — Inpatient Hospital Stay (HOSPITAL_COMMUNITY): Payer: Medicare Other

## 2019-09-02 ENCOUNTER — Encounter (HOSPITAL_COMMUNITY): Payer: Self-pay | Admitting: Certified Registered Nurse Anesthetist

## 2019-09-02 ENCOUNTER — Encounter (HOSPITAL_COMMUNITY)
Admission: EM | Disposition: A | Discharge status: Home Health | Payer: Self-pay | Source: Home / Self Care | Attending: Internal Medicine

## 2019-09-02 DIAGNOSIS — N186 End stage renal disease: Secondary | ICD-10-CM | POA: Diagnosis not present

## 2019-09-02 DIAGNOSIS — R82998 Other abnormal findings in urine: Secondary | ICD-10-CM | POA: Diagnosis not present

## 2019-09-02 DIAGNOSIS — N2589 Other disorders resulting from impaired renal tubular function: Secondary | ICD-10-CM | POA: Diagnosis not present

## 2019-09-02 DIAGNOSIS — E119 Type 2 diabetes mellitus without complications: Secondary | ICD-10-CM | POA: Diagnosis not present

## 2019-09-02 DIAGNOSIS — Z992 Dependence on renal dialysis: Secondary | ICD-10-CM | POA: Diagnosis not present

## 2019-09-02 DIAGNOSIS — D631 Anemia in chronic kidney disease: Secondary | ICD-10-CM | POA: Diagnosis not present

## 2019-09-02 DIAGNOSIS — Z4932 Encounter for adequacy testing for peritoneal dialysis: Secondary | ICD-10-CM | POA: Diagnosis not present

## 2019-09-02 DIAGNOSIS — Z23 Encounter for immunization: Secondary | ICD-10-CM | POA: Diagnosis not present

## 2019-09-02 DIAGNOSIS — N2581 Secondary hyperparathyroidism of renal origin: Secondary | ICD-10-CM | POA: Diagnosis not present

## 2019-09-02 DIAGNOSIS — E876 Hypokalemia: Secondary | ICD-10-CM | POA: Diagnosis not present

## 2019-09-02 DIAGNOSIS — D509 Iron deficiency anemia, unspecified: Secondary | ICD-10-CM | POA: Diagnosis not present

## 2019-09-02 HISTORY — PX: ESOPHAGOGASTRODUODENOSCOPY (EGD) WITH PROPOFOL: SHX5813

## 2019-09-02 HISTORY — PX: BIOPSY: SHX5522

## 2019-09-02 LAB — CBC
HCT: 33.7 % — ABNORMAL LOW (ref 36.0–46.0)
Hemoglobin: 11.1 g/dL — ABNORMAL LOW (ref 12.0–15.0)
MCH: 31.6 pg (ref 26.0–34.0)
MCHC: 32.9 g/dL (ref 30.0–36.0)
MCV: 96 fL (ref 80.0–100.0)
Platelets: 342 10*3/uL (ref 150–400)
RBC: 3.51 MIL/uL — ABNORMAL LOW (ref 3.87–5.11)
RDW: 15.9 % — ABNORMAL HIGH (ref 11.5–15.5)
WBC: 16.4 10*3/uL — ABNORMAL HIGH (ref 4.0–10.5)
nRBC: 0.4 % — ABNORMAL HIGH (ref 0.0–0.2)

## 2019-09-02 LAB — GLUCOSE, CAPILLARY
Glucose-Capillary: 129 mg/dL — ABNORMAL HIGH (ref 70–99)
Glucose-Capillary: 143 mg/dL — ABNORMAL HIGH (ref 70–99)
Glucose-Capillary: 165 mg/dL — ABNORMAL HIGH (ref 70–99)
Glucose-Capillary: 176 mg/dL — ABNORMAL HIGH (ref 70–99)
Glucose-Capillary: 178 mg/dL — ABNORMAL HIGH (ref 70–99)
Glucose-Capillary: 185 mg/dL — ABNORMAL HIGH (ref 70–99)
Glucose-Capillary: 207 mg/dL — ABNORMAL HIGH (ref 70–99)

## 2019-09-02 LAB — BASIC METABOLIC PANEL
Anion gap: 18 — ABNORMAL HIGH (ref 5–15)
BUN: 59 mg/dL — ABNORMAL HIGH (ref 8–23)
CO2: 24 mmol/L (ref 22–32)
Calcium: 10 mg/dL (ref 8.9–10.3)
Chloride: 92 mmol/L — ABNORMAL LOW (ref 98–111)
Creatinine, Ser: 8.37 mg/dL — ABNORMAL HIGH (ref 0.44–1.00)
GFR calc Af Amer: 5 mL/min — ABNORMAL LOW (ref >60)
GFR calc non Af Amer: 4 mL/min — ABNORMAL LOW (ref >60)
Glucose, Bld: 222 mg/dL — ABNORMAL HIGH (ref 70–99)
Potassium: 3.5 mmol/L (ref 3.5–5.1)
Sodium: 134 mmol/L — ABNORMAL LOW (ref 135–145)

## 2019-09-02 SURGERY — ESOPHAGOGASTRODUODENOSCOPY (EGD) WITH PROPOFOL
Anesthesia: Monitor Anesthesia Care

## 2019-09-02 MED ORDER — PROPOFOL 10 MG/ML IV BOLUS
INTRAVENOUS | Status: DC | PRN
  Administered 2019-09-02: 30 mg via INTRAVENOUS

## 2019-09-02 MED ORDER — SUCRALFATE 1 GM/10ML PO SUSP
1.0000 g | Freq: Three times a day (TID) | ORAL | Status: DC
  Administered 2019-09-02 (×3): 1 g via ORAL
  Filled 2019-09-02 (×3): qty 10

## 2019-09-02 MED ORDER — PROPOFOL 500 MG/50ML IV EMUL
INTRAVENOUS | Status: DC | PRN
  Administered 2019-09-02: 150 ug/kg/min via INTRAVENOUS

## 2019-09-02 MED ORDER — SODIUM CHLORIDE 0.9 % IV SOLN: INTRAVENOUS | Status: DC

## 2019-09-02 MED ORDER — SODIUM CHLORIDE 0.9 % IV SOLN
INTRAVENOUS | Status: AC | PRN
  Administered 2019-09-02: 1000 mL via INTRAMUSCULAR

## 2019-09-02 SURGICAL SUPPLY — 15 items

## 2019-09-02 NOTE — Progress Notes (Signed)
PROGRESS NOTE    Eileen Perez  ULA:[id number] DOB: 09-Dec-1940 DOA: 08/28/2019 PCP: Unk Pinto, MD   Brief Narrative: 78 year old with past medical history significant for pancreatic cyst, hypertension, hyperlipidemia, GERD, diabetes, end-stage renal disease on peritoneal dialysis who presents complaining of nausea and vomiting for the last 3 days prior to admission.  Patient peritoneal catheter was not working so she could not have peritoneal dialysis for the last couple of days. Evaluation in the ED CT scan with findings concerning for SBO and thickening of the antrum and pelvic area consistent with known gastric carcinoid tumor.  Admitted with SBO, which has been resolving. Now to undergo evaluation for abnormal thickening of stomach on CT scan. She was able to tolerates peritoneal Dialysis 10-12.   Assessment & Plan:   Active Problems:   Small bowel obstruction (HCC)   Protein-calorie malnutrition, severe   1-SBO:  -Surgery consulted and help with management. Sign off.  -never required  NG tube placement.  -Received Suppos.  -report mild nausea, didn't eat a lot yesterday. Had small BM yesterday.  -monitor closely. If no further improvement, might need repeat X ray and re-consult sx.   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  GI consulted.  IV protonix.  GI considering repeating endoscopy when current condition resolved. Might need EUS>  Endoscopy; planned for today.   3-ESRD on Peritoneal Dialysis: Nephrology consulted.  Underwent right IJ Temp HD catheter 10-09. Had HD 10-10 Underwent peritoneal HD 10-12, catheter was working.   4-Severe malnutrition in the context of chronic illness: Continue with protein supplements.  5-Leukocytosis;  Increase today, monitor.  Check chest x ray.   6-DM; Monitor CBG.   7-HTN; holding medication blood pressure soft.   Estimated body mass index is 20.83 kg/m as calculated from the following:   Height as of this encounter: 5\' 1"  (1.549 m).   Weight as of this encounter: 50 kg.   DVT prophylaxis: Heparin Code Status: Full code Family Communication: Daughter updated 10-13. She would like for Dr Arvid Right to call her after endoscopy.  Disposition Plan: Remain in the hospital for management of SBO, still with nausea. Poor oral intake.  Consultants:   Nephrology  GI  Surgery  Procedures:   Temporary IJ hemodialysis catheter  Antimicrobials:    Subjective: Report poor oral intake yesterday, due to nausea.  Denies abdominal pain. Had small BM yesterday.    Objective: Vitals:   09/01/19 2040 09/02/19 0232 09/02/19 0234 09/02/19 0409  BP: 130/80 130/71  124/74  Pulse: 80 85  82  Resp: 17 16  17   Temp: 97.8 F (36.6 C) 98.1 F (36.7 C)  98.2 F (36.8 C)  TempSrc: Oral Oral  Oral  SpO2: 100% 99%  99%  Weight: 50.8 kg  50 kg   Height:        Intake/Output Summary (Last 24 hours) at 09/02/2019 0853 Last data filed at 09/01/2019 1900 Gross per 24 hour  Intake 9487 ml  Output 200 ml  Net 9287 ml   Filed Weights   09/01/19 1805 09/01/19 2040 09/02/19 0234  Weight: 51.5 kg 50.8 kg 50 kg    Examination:  General exam: NAD Respiratory system: CTA Cardiovascular system: S 1, S 2 RRR Gastrointestinal system: BS present, soft, NT, peritoneal catheter in place.  Central nervous system: Alert non focal.  Extremities: Symmetric power.  Skin: No rashes.  Data Reviewed: I have personally reviewed following labs and imaging studies  CBC: Recent Labs  Lab 08/28/19 1930 08/29/19 0754 08/30/19 0733 09/01/19 0324 09/02/19 0837  WBC 13.2* 15.2* 10.0 11.8* 16.4*  NEUTROABS 11.5*  --   --   --   --   HGB 10.1* 9.8* 9.2* 9.9* 11.1*  HCT 32.4* 30.2* 29.1* 31.1* 33.7*  MCV 98.5 95.3 99.0 98.4 96.0  PLT 376 391 371 339 376   Basic  Metabolic Panel: Recent Labs  Lab 08/28/19 1930 08/29/19 0754 08/30/19 0733 09/01/19 0324  NA 133* 136 137 138  K 4.0 4.2 3.8 3.6  CL 89* 94* 95* 99  CO2 19* 20* 21* 23  GLUCOSE 196* 129* 140* 100*  BUN 77* 89* 81* 60*  CREATININE 9.55* 10.77* 8.96* 8.17*  CALCIUM 10.7* 10.4* 10.0 10.2  PHOS  --   --  7.7*  --    GFR: Estimated Creatinine Clearance: 4.2 mL/min (A) (by C-G formula based on SCr of 8.17 mg/dL (H)). Liver Function Tests: Recent Labs  Lab 08/28/19 1930 08/29/19 0754 08/30/19 0733  AST 13* 12*  --   ALT 14 12  --   ALKPHOS 93 84  --   BILITOT 0.9 0.9  --   PROT 7.3 6.3*  --   ALBUMIN 2.6* 2.5* 2.2*   Recent Labs  Lab 08/28/19 1930  LIPASE 22   No results for input(s): AMMONIA in the last 168 hours. Coagulation Profile: No results for input(s): INR, PROTIME in the last 168 hours. Cardiac Enzymes: No results for input(s): CKTOTAL, CKMB, CKMBINDEX, TROPONINI in the last 168 hours. BNP (last 3 results) No results for input(s): PROBNP in the last 8760 hours. HbA1C: No results for input(s): HGBA1C in the last 72 hours. CBG: Recent Labs  Lab 09/01/19 1700 09/01/19 2037 09/02/19 0035 09/02/19 0406 09/02/19 0739  GLUCAP 168* 131* 143* 178* 207*   Lipid Profile: No results for input(s): CHOL, HDL, LDLCALC, TRIG, CHOLHDL, LDLDIRECT in the last 72 hours. Thyroid Function Tests: No results for input(s): TSH, T4TOTAL, FREET4, T3FREE, THYROIDAB in the last 72 hours. Anemia Panel: No results for input(s): VITAMINB12, FOLATE, FERRITIN, TIBC, IRON, RETICCTPCT in the last 72 hours. Sepsis Labs: No results for input(s): PROCALCITON, LATICACIDVEN in the last 168 hours.  Recent Results (from the past 240 hour(s))  SARS CORONAVIRUS 2 (TAT 6-24 HRS) Nasopharyngeal Nasopharyngeal Swab     Status: None   Collection Time: 08/28/19  9:25 PM   Specimen: Nasopharyngeal Swab  Result Value Ref Range Status   SARS Coronavirus 2 NEGATIVE NEGATIVE Final    Comment:  (NOTE) SARS-CoV-2 target nucleic acids are NOT DETECTED. The SARS-CoV-2 RNA is generally detectable in upper and lower respiratory specimens during the acute phase of infection. Negative results do not preclude SARS-CoV-2 infection, do not rule out co-infections with other pathogens, and should not be used as the sole basis for treatment or other patient management decisions. Negative results must be combined with clinical observations, patient history, and epidemiological information. The expected result is Negative. Fact Sheet for Patients: SugarRoll.be Fact Sheet for Healthcare Providers: https://www.woods-mathews.com/ This test is not yet approved or cleared by the [place] FDA and  has been authorized for detection and/or diagnosis of SARS-CoV-2 by FDA under an Emergency Use Authorization (EUA). This EUA will remain  in effect (meaning this test can be used) for the duration of the COVID-19 declaration under Section 56 4(b)(1) of the Act, 21 U.S.C. section 360bbb-3(b)(1), unless the authorization is  terminated or revoked sooner. Performed at [facility], [place] [address], [place], [place] [address]   MRSA PCR Screening     Status: None   Collection Time: 08/29/19  1:34 AM   Specimen: Nasopharyngeal  Result Value Ref Range Status   MRSA by PCR NEGATIVE NEGATIVE Final    Comment:        The GeneXpert MRSA Assay (FDA approved for NASAL specimens only), is one component of a comprehensive MRSA colonization surveillance program. It is not intended to diagnose MRSA infection nor to guide or monitor treatment for MRSA infections. Performed at [facility], [place] [address], [place], [place] [address]          Radiology Studies: No results found.      Scheduled Meds: . Chlorhexidine Gluconate Cloth  6 each Topical Q0600  . darbepoetin (ARANESP) injection - DIALYSIS  25 mcg Intravenous Q Fri-HD  . feeding  supplement (ENSURE ENLIVE)  237 mL Oral BID BM  . gentamicin cream  1 application Topical Daily  . heparin  5,000 Units Subcutaneous Q8H  . multivitamin  1 tablet Oral QHS  . pantoprazole  40 mg Oral BID   Continuous Infusions: . dialysis solution 1.5% low-MG/low-CA       LOS: 5 days    Time spent: 35 minutes.     Elmarie Shiley, MD [facility] Pager [phone]  If 7PM-7AM, please contact night-coverage [email or web address] Password [email or web address] 09/02/2019, 8:53 AM

## 2019-09-02 NOTE — Anesthesia Preprocedure Evaluation (Addendum)
Anesthesia Evaluation  Patient identified by MRN, date of birth, ID band Patient awake    Reviewed: Allergy & Precautions, H&P , NPO status , Patient's Chart, lab work & pertinent test results, reviewed documented beta blocker date and time   Airway Mallampati: II  TM Distance: >3 FB Neck ROM: Full    Dental no notable dental hx. (+) Teeth Intact, Dental Advisory Given   Pulmonary neg pulmonary ROS,    Pulmonary exam normal breath sounds clear to auscultation       Cardiovascular hypertension, Pt. on medications and Pt. on home beta blockers  Rhythm:Regular Rate:Normal     Neuro/Psych negative neurological ROS  negative psych ROS   GI/Hepatic Neg liver ROS, hiatal hernia, GERD  Medicated and Controlled,  Endo/Other  diabetes  Renal/GU ESRF and DialysisRenal disease  negative genitourinary   Musculoskeletal  (+) Arthritis , Osteoarthritis,    Abdominal   Peds  Hematology  (+) Blood dyscrasia, anemia ,   Anesthesia Other Findings   Reproductive/Obstetrics negative OB ROS                            Anesthesia Physical Anesthesia Plan  ASA: III  Anesthesia Plan: MAC   Post-op Pain Management:    Induction: Intravenous  PONV Risk Score and Plan: 2 and Propofol infusion and Ondansetron  Airway Management Planned: Nasal Cannula  Additional Equipment:   Intra-op Plan:   Post-operative Plan:   Informed Consent: I have reviewed the patients History and Physical, chart, labs and discussed the procedure including the risks, benefits and alternatives for the proposed anesthesia with the patient or authorized representative who has indicated his/her understanding and acceptance.     Dental advisory given  Plan Discussed with: CRNA  Anesthesia Plan Comments:         Anesthesia Quick Evaluation

## 2019-09-02 NOTE — Anesthesia Postprocedure Evaluation (Signed)
Anesthesia Post Note  Patient: Litzi Binning  Procedure(s) Performed: ESOPHAGOGASTRODUODENOSCOPY (EGD) WITH PROPOFOL (N/A ) BIOPSY     Patient location during evaluation: Endoscopy Anesthesia Type: MAC Level of consciousness: awake and alert Pain management: pain level controlled Vital Signs Assessment: post-procedure vital signs reviewed and stable Respiratory status: spontaneous breathing, nonlabored ventilation and respiratory function stable Cardiovascular status: stable and blood pressure returned to baseline Postop Assessment: no apparent nausea or vomiting Anesthetic complications: no    Last Vitals:  Vitals:   09/02/19 1109 09/02/19 1118  BP: (!) 131/59 114/67  Pulse: 80 83  Resp: 20 15  Temp: 36.5 C   SpO2: 100% 99%    Last Pain:  Vitals:   09/02/19 1109  TempSrc: Temporal  PainSc: 0-No pain                 Nhyira Leano,W. EDMOND

## 2019-09-02 NOTE — Progress Notes (Signed)
[facility] Progress Note  Eileen Perez 78 y.o. 10-18-1941  CC: Small bowel obstruction, abnormal CT scan showing antral thickening   Subjective: No acute issues overnight.  Denies nausea vomiting.  Denies any bleeding episodes.     Objective: Vital signs in last 24 hours: Vitals:   09/02/19 0409 09/02/19 0954  BP: 124/74 (!) 145/78  Pulse: 82 91  Resp: 17 17  Temp: 98.2 F (36.8 C) 99 F (37.2 C)  SpO2: 99% 100%    Physical Exam:  General:  Alert, cooperative, no distress, appears stated age  Head:  Normocephalic, without obvious abnormality, atraumatic  Eyes:  , EOM's intact,   Lungs:   Clear to auscultation bilaterally, respirations unlabored  Heart:  Regular rate and rhythm, S1, S2 normal  Abdomen:    Mild abdominal distention, nontender, bowel sounds present.  No peritoneal signs  Extremities: Extremities normal, atraumatic, no  edema       Lab Results: Recent Labs    09/01/19 0324 09/02/19 0837  NA 138 134*  K 3.6 3.5  CL 99 92*  CO2 23 24  GLUCOSE 100* 222*  BUN 60* 59*  CREATININE 8.17* 8.37*  CALCIUM 10.2 10.0   No results for input(s): AST, ALT, ALKPHOS, BILITOT, PROT, ALBUMIN in the last 72 hours. Recent Labs    09/01/19 0324 09/02/19 0837  WBC 11.8* 16.4*  HGB 9.9* 11.1*  HCT 31.1* 33.7*  MCV 98.4 96.0  PLT 339 342   No results for input(s): LABPROT, INR in the last 72 hours.    Assessment/Plan: Partial small bowel obstruction.  Improving Abnormal CT scan showing diffuse wall thickening in the antrum and pyloric region of the stomach. Gastric carcinoid and adenoma.  EGD in June 2020 showed 15 mm polyp in the gastric body/near antrum.  It was removed with hot snare.  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  Patient also had few nodules in the duodenum.  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 End-stage renal disease.  On peritoneal dialysis. Chronic anemia  Recommendations ------------------------ -EGD today  Risks (bleeding, infection, bowel perforation that could require surgery, sedation-related changes in cardiopulmonary systems), benefits (identification and possible treatment of source of symptoms, exclusion of certain causes of symptoms), and alternatives (watchful waiting, radiographic imaging studies, empiric medical treatment)  were explained to patient in detail and patient wishes to proceed.   Otis Brace MD, [place] 09/02/2019, 10:30 AM  Contact #  [phone]

## 2019-09-02 NOTE — Brief Op Note (Signed)
08/28/2019 - 09/02/2019  11:09 AM  PATIENT:  Eileen Perez  78 y.o. female  PRE-OPERATIVE DIAGNOSIS:  N/V, SBO  POST-OPERATIVE DIAGNOSIS:  esophagitis, gastric biopsies   PROCEDURE:  Procedure(s): ESOPHAGOGASTRODUODENOSCOPY (EGD) WITH PROPOFOL (N/A) BIOPSY  SURGEON:  Surgeon(s) and Role:    * Glorianna Gott, MD - Primary  Findings ---------- -EGD showed LA grade B esophagitis in mid and distal esophagus as well as esophagitis at GE junction. -Previously noted tattoo was seen in the antrum without any residual polyp.  Multiple biopsies from different segments were taken. -It also showed diffuse gastritis and multiple biopsies from prepyloric stomach and gastric body were taken and placed in a separate jar.   Recommendations ----------------------- -Start full liquid diet, advance as tolerated -Continue IV twice daily PPI -Start Carafate -Repeat EGD in 2 to 3 months to document healing of esophagitis. -GI will follow  Otis Brace MD, [place] 09/02/2019, 11:11 AM  Contact #  [phone]

## 2019-09-02 NOTE — Anesthesia Procedure Notes (Signed)
Procedure Name: MAC Date/Time: 09/02/2019 10:49 AM Performed by: Larene [place], CRNA Pre-anesthesia Checklist: Patient identified, Emergency Drugs available, Suction available and Patient being monitored Patient Re-evaluated:Patient Re-evaluated prior to induction Oxygen Delivery Method: Nasal cannula Preoxygenation: Pre-oxygenation with 100% oxygen

## 2019-09-02 NOTE — Progress Notes (Signed)
Occupational Therapy Treatment Patient Details Name: Eileen Perez MRN: [id number] DOB: 09/24/1941 Today's Date: 09/02/2019    History of present illness Pt is 78 yo female presenting with small bowel obstruction. PMH including thyroid disease, pancreatic cyst, hypertension, hyperlipidemia, GERD, diabetes mellitus, ESRD.   OT comments  Pt declined OOB transfer this session as she was getting ready to leave for a procedure. Education on DME needs for home; demo'ed use of 3n1 as shower seat with pt verbalizing understanding. Updated DME recs and let OTR know about change in POC. Pt c/o of decreased strength in R hand. Issued pt theraputty HEP with level 1 theraputty. Pt return demonstrate all therapeutic exercises with good carryover. DC plan remains appropriate. Will continue to follow acutely for OT needs.   Follow Up Recommendations  No OT follow up    Equipment Recommendations  3 in 1 bedside commode    Recommendations for Other Services      Precautions / Restrictions Precautions Precautions: Fall Restrictions Weight Bearing Restrictions: No       Mobility Bed Mobility                  Transfers                 General transfer comment: declined OOB transfer    Balance                                           ADL either performed or assessed with clinical judgement   ADL Overall ADL's : Needs assistance/impaired                           Toilet Transfer Details (indicate cue type and reason): education on various DME needed for shower transfer; demo'ed use of 3n1 as shower seat with pt verbalizing understanding           General ADL Comments: pt declined further ADLs d/t having to stay in bed for test; session focus on DME needs and RUE ther ex     Vision Patient Visual Report: No change from baseline     Perception     Praxis      Cognition Arousal/Alertness: Awake/alert Behavior During Therapy: WFL for  tasks assessed/performed Overall Cognitive Status: Within Functional Limits for tasks assessed                                          Exercises Other Exercises Other Exercises: Theraputty HEP consisted of:  finger flexion/extension; pincer grasp of thumb and first finger, wrist pronation/ supination   Shoulder Instructions       General Comments      Pertinent Vitals/ Pain       Faces Pain Scale: Hurts a little bit Pain Descriptors / Indicators: Discomfort Pain Intervention(s): Repositioned  Home Living                                          Prior Functioning/Environment              Frequency  Min 3X/week        Progress Toward Goals  OT Goals(current goals  can now be found in the care plan section)  Progress towards OT goals: Progressing toward goals  Acute Rehab OT Goals Patient Stated Goal: get back to normal OT Goal Formulation: With patient Time For Goal Achievement: 09/13/19 Potential to Achieve Goals: Good ADL Goals Pt/caregiver will Perform Home Exercise Program: Right Upper extremity;Increased strength;Increased ROM;With theraputty  Plan Discharge plan remains appropriate    Co-evaluation                 AM-PAC OT "6 Clicks" Daily Activity     Outcome Measure   Help from another person eating meals?: None Help from another person taking care of personal grooming?: A Little Help from another person toileting, which includes using toliet, bedpan, or urinal?: A Little Help from another person bathing (including washing, rinsing, drying)?: None Help from another person to put on and taking off regular upper body clothing?: None Help from another person to put on and taking off regular lower body clothing?: None 6 Click Score: 22    End of Session    OT Visit Diagnosis: Unsteadiness on feet (R26.81);Other abnormalities of gait and mobility (R26.89);Muscle weakness (generalized) (M62.81);Pain    Activity Tolerance Patient tolerated treatment well   Patient Left in bed;with call bell/phone within reach   Nurse Communication Mobility status        Time: 0852-0908 OT Time Calculation (min): 16 min  Charges: OT General Charges $OT Visit: 1 Visit OT Treatments $Self Care/Home Management : 8-22 mins  [place], [place] [phone] [place] 09/02/2019, 4:19 PM

## 2019-09-02 NOTE — Progress Notes (Signed)
[facility] Progress Note   Subjective: Says she still feels a bit nauseated but no emesis. Small BM yesterday, no abdominal pain. Completed PD without issues.      Objective Vitals:   09/01/19 2040 09/02/19 0232 09/02/19 0234 09/02/19 0409  BP: 130/80 130/71  124/74  Pulse: 80 85  82  Resp: 17 16  17   Temp: 97.8 F (36.6 C) 98.1 F (36.7 C)  98.2 F (36.8 C)  TempSrc: Oral Oral  Oral  SpO2: 100% 99%  99%  Weight: 50.8 kg  50 kg   Height:       Physical Exam General: Pleasant older female in NAD Heart: S1,S2 RRR Lungs: CTAB A/P Abdomen: Soft, NT, PD in dwell cycle, no issues.  Extremities: No LE edema Dialysis Access: RIJ Vas Cath-drsg intact.     Additional Objective Labs: Basic Metabolic Panel: Recent Labs  Lab 08/29/19 0754 08/30/19 0733 09/01/19 0324  NA 136 137 138  K 4.2 3.8 3.6  CL 94* 95* 99  CO2 20* 21* 23  GLUCOSE 129* 140* 100*  BUN 89* 81* 60*  CREATININE 10.77* 8.96* 8.17*  CALCIUM 10.4* 10.0 10.2  PHOS  --  7.7*  --    Liver Function Tests: Recent Labs  Lab 08/28/19 1930 08/29/19 0754 08/30/19 0733  AST 13* 12*  --   ALT 14 12  --   ALKPHOS 93 84  --   BILITOT 0.9 0.9  --   PROT 7.3 6.3*  --   ALBUMIN 2.6* 2.5* 2.2*   Recent Labs  Lab 08/28/19 1930  LIPASE 22   CBC: Recent Labs  Lab 08/28/19 1930 08/29/19 0754 08/30/19 0733 09/01/19 0324  WBC 13.2* 15.2* 10.0 11.8*  NEUTROABS 11.5*  --   --   --   HGB 10.1* 9.8* 9.2* 9.9*  HCT 32.4* 30.2* 29.1* 31.1*  MCV 98.5 95.3 99.0 98.4  PLT 376 391 371 339   Blood Culture No results found for: SDES, SPECREQUEST, CULT, REPTSTATUS  Cardiac Enzymes: No results for input(s): CKTOTAL, CKMB, CKMBINDEX, TROPONINI in the last 168 hours. CBG: Recent Labs  Lab 09/01/19 1700 09/01/19 2037 09/02/19 0035 09/02/19 0406 09/02/19 0739  GLUCAP 168* 131* 143* 178* 207*   Iron Studies: No results for input(s): IRON, TIBC, TRANSFERRIN, FERRITIN in the last 72  hours. @lablastinr3 @ Studies/Results: No results found. Medications: . dialysis solution 1.5% low-MG/low-CA     . Chlorhexidine Gluconate Cloth  6 each Topical Q0600  . darbepoetin (ARANESP) injection - DIALYSIS  25 mcg Intravenous Q Fri-HD  . feeding supplement (ENSURE ENLIVE)  237 mL Oral BID BM  . gentamicin cream  1 application Topical Daily  . heparin  5,000 Units Subcutaneous Q8H  . multivitamin  1 tablet Oral QHS  . pantoprazole  40 mg Oral BID     Dialysis Orders: CCPD, 7X Week, EDW 53 (kg) Ca 2.5 (mEq/L) Mg 0.5 (mEq/L) Dextrose 1.5; 2.5; 4.25 %, # Exchanges 5, fill vol 2500 mL, Dwell Time 2 hrs 0 min,    Assessment/Plan: 1. Partial SBO-Appears to be resolving. Has been advanced to full liquid diet. Surgery/GI following.  2. 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 Did not feel that systemic treatment was needed at that time. Unclear if that is related to recent events. Scheduled for endoscopy today.  3. ESRD - Continue CCPD at current orders. Labs pending this AM.. PD cath was not functional in the setting of partial SBO and Felt best to  give her belly a break, S/p Vas Cath andhemodialysis times one on 10/10. PD catheter now functioning,had full treatment and will have vas cath removed and she can continue PD as previously ordered.  4. Hypertension/volume - Home BP meds on hold D/T being NPO. BP is well controlled, no evidence of volume overload by exam.  5. Anemia - HBG 9.9 today. Did NOT receive ESA as ordered Friday. Follow HGB.  6. Metabolic bone disease - Has been on Renvela packets 2.4grams TID AC and sensipar 30 mg daily. Corrected Ca stable at 11.6- that has been a recent development - due to carcinoid ?is better. Resume medsat a later date.  Zhanae Proffit H. Shekelia Boutin NP-C 09/02/2019, 8:46 AM  [employer] [phone]

## 2019-09-02 NOTE — Plan of Care (Signed)
  Problem: Education: Goal: Knowledge of General Education information will improve Description Including pain rating scale, medication(s)/side effects and non-pharmacologic comfort measures Outcome: Progressing   

## 2019-09-02 NOTE — Op Note (Signed)
[facility] Patient Name: Eileen Perez Procedure Date : 09/02/2019 MRN: [id number] Attending MD: Otis Brace , MD Date of Birth: 1941/04/23 CSN: [id number] Age: 78 Admit Type: Inpatient Procedure:                Upper GI endoscopy Indications:              Abnormal CT of the GI tract, Follow-up of gastric                            polyps Providers:                Otis Brace, MD, Jeanella Cara, RN,                            Lina Sar, Technician, Wyatt Haste Referring MD:              Medicines:                Sedation Administered by an Anesthesia Professional Complications:            No immediate complications. Estimated Blood Loss:     Estimated blood loss was minimal. Procedure:                Pre-Anesthesia Assessment:                           - Prior to the procedure, a History and Physical                            was performed, and patient medications and                            allergies were reviewed. The patient's tolerance of                            previous anesthesia was also reviewed. The risks                            and benefits of the procedure and the sedation                            options and risks were discussed with the patient.                            All questions were answered, and informed consent                            was obtained. Prior Anticoagulants: The patient has                            taken no previous anticoagulant or antiplatelet                            agents except for aspirin. ASA Grade Assessment:  III - A patient with severe systemic disease. After                            reviewing the risks and benefits, the patient was                            deemed in satisfactory condition to undergo the                            procedure.                           After obtaining informed consent, the endoscope was   passed under direct vision. Throughout the                            procedure, the patient's blood pressure, pulse, and                            oxygen saturations were monitored continuously. The                            GIF-H190 ([id number]) Olympus gastroscope was                            introduced through the mouth, and advanced to the                            second part of duodenum. The upper GI endoscopy was                            accomplished without difficulty. The patient                            tolerated the procedure well. Scope In: Scope Out: Findings:      LA Grade D (one or more mucosal breaks involving at least 75% of       esophageal circumference) esophagitis with no bleeding was found in the       mid and distal esophagus.      A non-obstructing Schatzki ring was found at the gastroesophageal       junction.      A tattoo was seen in the gastric antrum/body (greater curvature). A       post-polypectomy scar was found at the tattoo site. Biopsies were taken       with a cold forceps for histology.      Diffuse moderate inflammation characterized by congestion (edema),       erosions and erythema was found in the prepyloric region of the stomach.       Biopsies were taken with a cold forceps for histology.      Diffuse moderate inflammation characterized by congestion (edema),       erosions and erythema was found in the gastric body. Biopsies were taken       with a cold forceps for histology.      The cardia and gastric fundus were normal on retroflexion.  The duodenal bulb, first portion of the duodenum and second portion of       the duodenum were normal. Impression:               - LA Grade D esophagitis.                           - Non-obstructing Schatzki ring.                           - A tattoo was seen in the gastric antrum (greater                            curvature). A post-polypectomy scar was found at                            the  tattoo site. Biopsied.                           - Gastritis. Biopsied.                           - Gastritis. Biopsied.                           - Normal duodenal bulb, first portion of the                            duodenum and second portion of the duodenum. Recommendation:           - Patient has a contact number available for                            emergencies. The signs and symptoms of potential                            delayed complications were discussed with the                            patient. Return to normal activities tomorrow.                            Written discharge instructions were provided to the                            patient.                           - Resume previous diet.                           - Continue present medications.                           - Await pathology results.                           - Repeat upper  endoscopy in 3 months to check                            healing. Procedure Code(s):        --- Professional ---                           [phone], Esophagogastroduodenoscopy, flexible,                            transoral; with biopsy, single or multiple Diagnosis Code(s):        --- Professional ---                           K20.9, Esophagitis, unspecified                           K22.2, Esophageal obstruction                           K29.70, Gastritis, unspecified, without bleeding                           K31.7, Polyp of stomach and duodenum                           R93.3, Abnormal findings on diagnostic imaging of                            other parts of digestive tract CPT copyright 2019 American Medical Association. All rights reserved. The codes documented in this report are preliminary and upon coder review may  be revised to meet current compliance requirements. Otis Brace, MD Otis Brace, MD 09/02/2019 11:09:03 AM Number of Addenda: 0

## 2019-09-02 NOTE — Transfer of Care (Signed)
Immediate Anesthesia Transfer of Care Note  Patient: Eileen Perez  Procedure(s) Performed: ESOPHAGOGASTRODUODENOSCOPY (EGD) WITH PROPOFOL (N/A ) BIOPSY  Patient Location: Endoscopy Unit  Anesthesia Type:MAC  Level of Consciousness: drowsy and patient cooperative  Airway & Oxygen Therapy: Patient Spontanous Breathing  Post-op Assessment: Report given to RN, Post -op Vital signs reviewed and stable and Patient moving all extremities X 4  Post vital signs: Reviewed and stable  Last Vitals:  Vitals Value Taken Time  BP 131/59 09/02/19 1109  Temp    Pulse 80 09/02/19 1109  Resp 20 09/02/19 1109  SpO2 100 % 09/02/19 1109  Vitals shown include unvalidated device data.  Last Pain:  Vitals:   09/02/19 0954  TempSrc: Temporal  PainSc: 0-No pain         Complications: No apparent anesthesia complications

## 2019-09-02 NOTE — Consult Note (Signed)
   [facility] CM Inpatient Consult   09/02/2019  Eileen Perez 08-Apr-1941 [id number]   Referral: Inpatient TOC RNCM  We have reviewed your referral request for diabetes follow up for medication assistance and disease management.  This writer attempted to reach the patient by telephone.  Chart reviewed reveals from MD progress notes today:  78 year old with past medical history significant for pancreatic cyst, hypertension, hyperlipidemia, GERD, diabetes, end-stage renal disease on peritoneal dialysis who presents complaining of nausea and vomiting for the last 3 days prior to admission.  Patient peritoneal catheter was not working so she could not have peritoneal dialysis for the last couple of days.  Admitted with SBO, which has been resolving. Now to undergo evaluation for abnormal thickening of stomach on CT scan. She was able to tolerates peritoneal Dialysis 10-12.  Attempts to reach out to patient was currently unsuccessful as patient is post procedure.  Plan:  Will attempt follow up on referral for post hospital disease management needs.  Continue to follow up for disposition needs.  Natividad Brood, RN BSN [facility] Liaison  [phone] business mobile phone Toll free office [phone]  Fax number: [phone] [email or web address]

## 2019-09-02 NOTE — Progress Notes (Signed)
PT Cancellation Note  Patient Details Name: Eileen Perez MRN: [id number] DOB: 11-06-1941   Cancelled Treatment:    Reason Eval/Treat Not Completed: Patient at procedure or test/unavailable(Pt in endoscopy. Will return as able.  )   Denice Paradise 09/02/2019, 11:02 AM Marlis Oldaker,PT Acute Rehabilitation Services Pager:  [phone]  Office:  [phone]

## 2019-09-02 NOTE — Progress Notes (Signed)
Able to her patient wretching from doorway. Patient c/o vomiting  after sips of liquids and p.o. meds. Emesis discarded before nurse able to visualize.

## 2019-09-03 DIAGNOSIS — E876 Hypokalemia: Secondary | ICD-10-CM | POA: Diagnosis not present

## 2019-09-03 DIAGNOSIS — Z4932 Encounter for adequacy testing for peritoneal dialysis: Secondary | ICD-10-CM | POA: Diagnosis not present

## 2019-09-03 DIAGNOSIS — Z992 Dependence on renal dialysis: Secondary | ICD-10-CM | POA: Diagnosis not present

## 2019-09-03 DIAGNOSIS — I10 Essential (primary) hypertension: Secondary | ICD-10-CM

## 2019-09-03 DIAGNOSIS — N186 End stage renal disease: Secondary | ICD-10-CM | POA: Diagnosis not present

## 2019-09-03 DIAGNOSIS — Z23 Encounter for immunization: Secondary | ICD-10-CM | POA: Diagnosis not present

## 2019-09-03 DIAGNOSIS — N2581 Secondary hyperparathyroidism of renal origin: Secondary | ICD-10-CM | POA: Diagnosis not present

## 2019-09-03 DIAGNOSIS — E119 Type 2 diabetes mellitus without complications: Secondary | ICD-10-CM | POA: Diagnosis not present

## 2019-09-03 DIAGNOSIS — R82998 Other abnormal findings in urine: Secondary | ICD-10-CM | POA: Diagnosis not present

## 2019-09-03 DIAGNOSIS — D509 Iron deficiency anemia, unspecified: Secondary | ICD-10-CM | POA: Diagnosis not present

## 2019-09-03 DIAGNOSIS — N2589 Other disorders resulting from impaired renal tubular function: Secondary | ICD-10-CM | POA: Diagnosis not present

## 2019-09-03 DIAGNOSIS — E43 Unspecified severe protein-calorie malnutrition: Secondary | ICD-10-CM

## 2019-09-03 DIAGNOSIS — D631 Anemia in chronic kidney disease: Secondary | ICD-10-CM | POA: Diagnosis not present

## 2019-09-03 DIAGNOSIS — D72829 Elevated white blood cell count, unspecified: Secondary | ICD-10-CM

## 2019-09-03 LAB — GLUCOSE, CAPILLARY
Glucose-Capillary: 191 mg/dL — ABNORMAL HIGH (ref 70–99)
Glucose-Capillary: 149 mg/dL — ABNORMAL HIGH (ref 70–99)
Glucose-Capillary: 140 mg/dL — ABNORMAL HIGH (ref 70–99)
Glucose-Capillary: 163 mg/dL — ABNORMAL HIGH (ref 70–99)
Glucose-Capillary: 138 mg/dL — ABNORMAL HIGH (ref 70–99)

## 2019-09-03 LAB — CBC
HCT: 34.3 % — ABNORMAL LOW (ref 36.0–46.0)
Hemoglobin: 10.9 g/dL — ABNORMAL LOW (ref 12.0–15.0)
MCH: 30.7 pg (ref 26.0–34.0)
MCHC: 31.8 g/dL (ref 30.0–36.0)
MCV: 96.6 fL (ref 80.0–100.0)
Platelets: 356 10*3/uL (ref 150–400)
RBC: 3.55 MIL/uL — ABNORMAL LOW (ref 3.87–5.11)
RDW: 16 % — ABNORMAL HIGH (ref 11.5–15.5)
WBC: 15.3 10*3/uL — ABNORMAL HIGH (ref 4.0–10.5)
nRBC: 0.5 % — ABNORMAL HIGH (ref 0.0–0.2)

## 2019-09-03 LAB — BASIC METABOLIC PANEL
Anion gap: 20 — ABNORMAL HIGH (ref 5–15)
BUN: 58 mg/dL — ABNORMAL HIGH (ref 8–23)
CO2: 22 mmol/L (ref 22–32)
Calcium: 9.8 mg/dL (ref 8.9–10.3)
Chloride: 95 mmol/L — ABNORMAL LOW (ref 98–111)
Creatinine, Ser: 8.12 mg/dL — ABNORMAL HIGH (ref 0.44–1.00)
GFR calc Af Amer: 5 mL/min — ABNORMAL LOW (ref >60)
GFR calc non Af Amer: 4 mL/min — ABNORMAL LOW (ref >60)
Glucose, Bld: 180 mg/dL — ABNORMAL HIGH (ref 70–99)
Potassium: 3.2 mmol/L — ABNORMAL LOW (ref 3.5–5.1)
Sodium: 137 mmol/L (ref 135–145)

## 2019-09-03 LAB — SURGICAL PATHOLOGY

## 2019-09-03 MED ORDER — ENSURE ENLIVE PO LIQD: 237.0000 mL | Freq: Every day | ORAL | Status: DC

## 2019-09-03 MED ORDER — ALLOPURINOL 300 MG PO TABS
300.0000 mg | ORAL_TABLET | ORAL | Status: DC
  Administered 2019-09-03 – 2019-09-05 (×2): 300 mg via ORAL
  Filled 2019-09-03 (×3): qty 1

## 2019-09-03 MED ORDER — NEPRO/CARBSTEADY PO LIQD
237.0000 mL | Freq: Two times a day (BID) | ORAL | Status: DC
  Administered 2019-09-04 – 2019-09-06 (×4): 237 mL via ORAL

## 2019-09-03 NOTE — Progress Notes (Signed)
Nutrition Follow-up  DOCUMENTATION CODES:   Severe malnutrition in context of chronic illness  INTERVENTION:   -Downgrade diet to dysphagia 3 (advanced mechanical soft) for ease of intake -Renal MVI daily -Ensure Enlive po q HS, each supplement provides 350 kcal and 20 grams of protein -Nepro Shake po BID, each supplement provides 425 kcal and 19 grams protein -Magic cup TID with meals, each supplement provides 290 kcal and 9 grams of protein  NUTRITION DIAGNOSIS:   Severe Malnutrition related to chronic illness(ESRD on PD) as evidenced by energy intake < or equal to 75% for > or equal to 1 month, moderate fat depletion, severe fat depletion, moderate muscle depletion, severe muscle depletion, percent weight loss.  Ongoing  GOAL:   Patient will meet greater than or equal to 90% of their needs  Progressing   MONITOR:   PO intake, Supplement acceptance, Diet advancement, Labs, Weight trends, Skin, I & O's  REASON FOR ASSESSMENT:   Malnutrition Screening Tool    ASSESSMENT:   Eileen Perez  is a 78 y.o. female, with history of thyroid disease, pancreatic cyst, hypertension, hyperlipidemia, GERD, diabetes mellitus, ESRD, presents to the ED today with a chief complaint of vomiting.  Patient reports that she has been vomiting for more than 3 days.  She only drinks liquids and does not eat at home for more than a week.  Still she continues to have multiple episodes of emesis each day.  Nonbloody nonbilious emesis.  She reports that before this she did have loose stools for several days.  She cannot quantify exact amount of days.  Loose stool was not watery, and nonbloody.  Patient reports that the symptoms are associated with lower abdominal pain.  It is lower abdomen and straight across.  Not on one side more than the other.  Patient does not make urine.  Patient has no vaginal bleeding.  Patient does report that she has been constipated.  Patient does report 2 abdominal surgeries in  her history.  The first is a gallbladder, the second with a pancreatic cyst.  10/9- s/p rt IJ temporary HD cath placement  10/10- advanced to clear liquids  10/12- advanced to full liquids  10/14- advanced to solid foods  Reviewed I/O's: +1.4 L x 24 hours and +11.4 L since admission  Spoke with pt at bedside, who was sitting in bed drinking Ensure at time of visit. Pt reports concern over continued minimal intake. She reports consuming soup and liquids for dinner well, however, had difficulty tolerating bacon and eggs this morning. She states "it all comes right back up". Observed pt consume Ensure supplement without difficulty. Pt reports she feels more comfortable consuming liquids instead of solid foods at this time.   Pt reports "I need to do something to eat more". RD reviewed other supplement options with pt; she is amenable to adding Nepro shakes, due to increased calories.   Labs reviewed: K: 3.2, CBGS: 140-191.   Diet Order:   Diet Order            DIET DYS 3 Room service appropriate? Yes; Fluid consistency: Thin; Fluid restriction: 1200 mL Fluid  Diet effective now              EDUCATION NEEDS:   Education needs have been addressed  Skin:  Skin Assessment: Reviewed RN Assessment  Last BM:  09/02/19  Height:   Ht Readings from Last 1 Encounters:  09/02/19 5' (1.524 m)    Weight:   Wt Readings  from Last 1 Encounters:  09/03/19 49.6 kg    Ideal Body Weight:  47.7 kg  BMI:  Body mass index is 21.36 kg/m.  Estimated Nutritional Needs:   Kcal:  1650-1850  Protein:  80-95 grams  Fluid:  1000 ml + UOP    Ouida Abeyta A. Jimmye Norman, RD, LDN, [place] Registered Dietitian II Certified Diabetes Care and Education Specialist Pager: [phone] After hours Pager: [phone]

## 2019-09-03 NOTE — TOC Progression Note (Signed)
Transition of Care [facility]) - Progression Note    Patient Details  Name: Eileen Perez MRN: [id number] Date of Birth: 20-Dec-1940  Transition of Care [facility]) CM/SW Contact  Jacalyn Lefevre Edson Snowball, RN Phone Number: 09/03/2019, 4:56 PM  Clinical Narrative:    Patient requested NCM to call daughter Langley Gauss . Called from patient's cell phone , no answer left message . Patient stated daughter wants her to have a home health aide. Messaged MD for order and notified Tommi Rumps with [facility].   Expected Discharge Plan: [place] Barriers to Discharge: Continued Medical Work up  Expected Discharge Plan and Services Expected Discharge Plan: [place]   Discharge Planning Services: CM Consult Post Acute Care Choice: [place] arrangements for the past 2 months: Single Family Home                 DME Arranged: N/A         HH Arranged: RN           Social Determinants of Health (SDOH) Interventions    Readmission Risk Interventions No flowsheet data found.

## 2019-09-03 NOTE — Progress Notes (Signed)
[facility] Progress Note  Eileen Perez 78 y.o. 30-Mar-1941  CC: Small bowel obstruction, abnormal CT scan showing antral thickening   Subjective: She had few episodes of nausea and vomiting yesterday afternoon which has resolved now.  Last bowel movement yesterday.  Tolerating diet this morning.   ROS : Negative for acute chest pain and shortness of breath  Objective: Vital signs in last 24 hours: Vitals:   09/02/19 2004 09/03/19 0443  BP: 114/69 116/73  Pulse: 84 84  Resp: 18 16  Temp: 98.3 F (36.8 C) (!) 97.5 F (36.4 C)  SpO2: 98% 99%    Physical Exam:  General:  Alert, cooperative, no distress, appears stated age  Head:  Normocephalic, without obvious abnormality, atraumatic  Eyes:  , EOM's intact,   Lungs:   Clear to auscultation bilaterally, respirations unlabored  Heart:  Regular rate and rhythm, S1, S2 normal  Abdomen:    Mild abdominal distention, nontender, bowel sounds present.  No peritoneal signs  Extremities: Extremities normal, atraumatic, no  edema       Lab Results: Recent Labs    09/02/19 0837 09/03/19 0154  NA 134* 137  K 3.5 3.2*  CL 92* 95*  CO2 24 22  GLUCOSE 222* 180*  BUN 59* 58*  CREATININE 8.37* 8.12*  CALCIUM 10.0 9.8   No results for input(s): AST, ALT, ALKPHOS, BILITOT, PROT, ALBUMIN in the last 72 hours. Recent Labs    09/02/19 0837 09/03/19 0154  WBC 16.4* 15.3*  HGB 11.1* 10.9*  HCT 33.7* 34.3*  MCV 96.0 96.6  PLT 342 356   No results for input(s): LABPROT, INR in the last 72 hours.    Assessment/Plan: Partial small bowel obstruction.  Improving Abnormal CT scan showing diffuse wall thickening in the antrum and pyloric region of the stomach. Gastric carcinoid and adenoma.  EGD in June 2020 showed 15 mm polyp in the gastric body/near antrum.  It was removed with hot snare.  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  Patient also had few nodules in the duodenum.  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 End-stage renal disease.  On peritoneal dialysis. Chronic anemia  Recommendations ------------------------ -EGD yesterday showed LA grade B esophagitis in mid and distal esophagus as well as esophagitis at GE junction. Previously noted tattoo was seen in the antrum without any residual polyp.  Multiple biopsies from different segments were taken.It also showed diffuse gastritis and multiple biopsies from prepyloric stomach and gastric body were taken and placed in a separate jar.  -Biopsies pending.  On IV twice daily PPI and Carafate for now (need to discontinue Carafate prior to discharge because of end-stage renal disease) -Continue current management -Advance diet slowly as tolerated -GI will follow  Otis Brace MD, FACP 09/03/2019, 8:55 AM  Contact #  [phone]

## 2019-09-03 NOTE — Progress Notes (Addendum)
PROGRESS NOTE    Eileen Perez   JGG:[id number]  DOB: 08-31-1941  DOA: 08/28/2019 PCP: Unk Pinto, MD   Brief Narrative:  Eileen Perez is a 78 year old with end-stage renal disease on peritoneal dialysis, diabetes mellitus, hypertension, hyperlipidemia, gastroesophageal reflux, pancreatic cyst, carcinoid tumor being followed by Dr. Benay Spice with no evidence of metastasis.-She presented to the ED with nausea. 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 GI consulted on 10/9 for thickening noted in gastric area  Subjective: States that she has not vomited since yesterday, however she has not yet had anything to drink this morning and is awaiting her clear liquid tray.  She states she had a larger than usual bowel movement yesterday around 4 PM.  Currently no abdominal pain.  Assessment & Plan:   Principal Problem:   Small bowel obstruction  -Partial small bowel obstruction on admission treated conservatively with bowel rest -Resolved spontaneously-tolerating clears by 10/12  Active Problems:     Carcinoid tumor of stomach  -  increased thickening of gastric antrum and pyloric areas noted on CT now with 1 month of nausea -Previous carcinoid tumor involved a resected gastric polyp was no evidence of metastatic disease and no indication for systemic therapy-she was recommended by Dr. Benay Spice to follow-up  with Dr.Brahmbatt for surveillance EGD -Dr. Arbie Cookey plan to follow her up 8 months after July's visit -GI was consulted on 10/9 as mentioned -The patient underwent an EGD 13 which revealed the following: 1 space LA grade B esophagitis in the mid and distal esophagus as well as esophagitis and GE junction 2  diffuse gastritis -Biopsies taken from various areas will need to be followed by GI -GI plans for twice daily PPI follow and repeat up endoscopy in 3 months -Slowly advance diet from current clear liquids -she was still vomiting yesterday and has not drank anything today and thus I will not yet advance it    ESRD (end stage renal disease)   -Undergoes peritoneal dialysis at home -Appreciate nephrology- follow-up in the hospital -Status post Vas-Cath and hemodialysis 1 time on 10/10 due to problems with peritoneal dialysis -continue CCPD per nephrology    Essential hypertension -Home meds include hydralazine & labetalol -BP not elevated  Gout -Resume allopurinol    Protein-calorie malnutrition, severe -Currently on Ensure continue  Diabetes mellitus 2-diet controlled Not on any medications at home-last A1c was 6.5 when checked in 4/29-her sugars in the hospital are not very high  Time spent in minutes: 40 DVT prophylaxis: Heparin Code Status: Full code Family Communication:  Disposition Plan: Continue to follow oral intake Home with home health when ready to discharge Consultants:   Nephrology  GI  General surgery Procedures:   EGD 10/13 - LA Grade D esophagitis. - Non-obstructing Schatzki ring. - A tattoo was seen in the gastric antrum (greater curvature). A post-polypectomy scar was found at the tattoo site. Biopsied. - Gastritis. Biopsied. - Gastritis. Biopsied. - Normal duodenal bulb, first portion of the duodenum and second portion  of the duodenum. Antimicrobials:  Anti-infectives (From admission, onward)   None       Objective: Vitals:   09/02/19  1442 09/02/19 1715 09/02/19 2004 09/03/19 0443  BP: 117/70 127/74 114/69 116/73  Pulse: 84 86 84 84  Resp: 18 16 18 16   Temp: 97.9 F (36.6 C) 98.4 F (36.9 C) 98.3 F (36.8 C) (!) 97.5 F (36.4 C)  TempSrc: Oral Oral Oral Oral  SpO2: 100% 100% 98% 99%  Weight:  51.1 kg    Height:        Intake/Output Summary (Last 24 hours) at 09/03/2019 0903 Last data filed at 09/02/2019 1833 Gross per 24 hour  Intake 4688 ml  Output 3325 ml  Net 1363 ml   Filed Weights   09/02/19 0234 09/02/19 0954 09/02/19 1715  Weight: 50 kg 50 kg 51.1 kg    Examination: General exam: Appears comfortable  HEENT: PERRLA, oral mucosa moist, no sclera icterus or thrush Respiratory system: Clear to auscultation. Respiratory effort normal. Cardiovascular system: S1 & S2 heard, RRR.   Gastrointestinal system: Abdomen soft, tender in lower abdomen, nondistended. Normal bowel sounds.-Dialysis catheter present Central nervous system: Alert and oriented. No focal neurological deficits. Extremities: No cyanosis, clubbing or edema Skin: No rashes or ulcers Psychiatry:  Mood & affect appropriate.     Data Reviewed: I have personally reviewed following labs and imaging studies  CBC: Recent Labs  Lab 08/28/19 1930 08/29/19 0754 08/30/19 0733 09/01/19 0324 09/02/19 0837 09/03/19 0154  WBC 13.2* 15.2* 10.0 11.8* 16.4* 15.3*  NEUTROABS 11.5*  --   --   --   --   --   HGB 10.1* 9.8* 9.2* 9.9* 11.1* 10.9*  HCT 32.4* 30.2* 29.1* 31.1* 33.7* 34.3*  MCV 98.5 95.3 99.0 98.4 96.0 96.6  PLT 376 391 371 339 342 353   Basic Metabolic Panel: Recent Labs  Lab 08/29/19 0754 08/30/19 0733 09/01/19 0324 09/02/19 0837 09/03/19 0154  NA 136 137 138 134* 137  K 4.2 3.8 3.6 3.5 3.2*  CL 94* 95* 99 92* 95*  CO2 20* 21* 23 24 22   GLUCOSE 129* 140* 100* 222* 180*  BUN 89* 81* 60* 59* 58*  CREATININE 10.77* 8.96* 8.17* 8.37* 8.12*  CALCIUM 10.4* 10.0 10.2 10.0 9.8  PHOS  --  7.7*  --   --   --    GFR:  Estimated Creatinine Clearance: 4 mL/min (A) (by C-G formula based on SCr of 8.12 mg/dL (H)). Liver Function Tests: Recent Labs  Lab 08/28/19 1930 08/29/19 0754 08/30/19 0733  AST 13* 12*  --   ALT 14 12  --   ALKPHOS 93 84  --   BILITOT 0.9 0.9  --   PROT 7.3 6.3*  --   ALBUMIN 2.6* 2.5* 2.2*   Recent Labs  Lab 08/28/19 1930  LIPASE 22   No results for input(s): AMMONIA in the last 168 hours. Coagulation Profile: No results for input(s): INR, PROTIME in the last 168 hours. Cardiac Enzymes: No results for input(s): CKTOTAL, CKMB, CKMBINDEX, TROPONINI in the last 168 hours. BNP (last 3 results) No results for input(s): PROBNP in the last 8760 hours. HbA1C: No results for input(s): HGBA1C in the last 72 hours. CBG: Recent Labs  Lab 09/02/19 1618 09/02/19 2007 09/02/19 2340 09/03/19 0445 09/03/19 0830  GLUCAP 129* 185* 176* 191* 149*   Lipid Profile: No results for input(s): CHOL, HDL, LDLCALC, TRIG, CHOLHDL, LDLDIRECT in the last 72 hours. Thyroid Function Tests: No results for  input(s): TSH, T4TOTAL, FREET4, T3FREE, THYROIDAB in the last 72 hours. Anemia Panel: No results for input(s): VITAMINB12, FOLATE, FERRITIN, TIBC, IRON, RETICCTPCT in the last 72 hours. Urine analysis:    Component Value Date/Time   COLORURINE YELLOW 03/25/2019 1548   APPEARANCEUR TURBID (A) 03/25/2019 1548   LABSPEC 1.007 03/25/2019 1548   PHURINE 7.0 03/25/2019 1548   GLUCOSEU NEGATIVE 03/25/2019 1548   HGBUR 2+ (A) 03/25/2019 1548   BILIRUBINUR NEGATIVE 12/08/2016 1115   KETONESUR NEGATIVE 03/25/2019 1548   PROTEINUR 2+ (A) 03/25/2019 1548   UROBILINOGEN 0.2 05/19/2015 0942   NITRITE NEGATIVE 03/25/2019 1548   LEUKOCYTESUR 3+ (A) 03/25/2019 1548   Sepsis Labs: @LABRCNTIP (procalcitonin:4,lacticidven:4) ) Recent Results (from the past 240 hour(s))  SARS CORONAVIRUS 2 (TAT 6-24 HRS) Nasopharyngeal Nasopharyngeal Swab     Status: None   Collection Time: 08/28/19  9:25 PM    Specimen: Nasopharyngeal Swab  Result Value Ref Range Status   SARS Coronavirus 2 NEGATIVE NEGATIVE Final    Comment: (NOTE) SARS-CoV-2 target nucleic acids are NOT DETECTED. The SARS-CoV-2 RNA is generally detectable in upper and lower respiratory specimens during the acute phase of infection. Negative results do not preclude SARS-CoV-2 infection, do not rule out co-infections with other pathogens, and should not be used as the sole basis for treatment or other patient management decisions. Negative results must be combined with clinical observations, patient history, and epidemiological information. The expected result is Negative. Fact Sheet for Patients: SugarRoll.be Fact Sheet for Healthcare Providers: https://www.woods-mathews.com/ This test is not yet approved or cleared by the [place] FDA and  has been authorized for detection and/or diagnosis of SARS-CoV-2 by FDA under an Emergency Use Authorization (EUA). This EUA will remain  in effect (meaning this test can be used) for the duration of the COVID-19 declaration under Section 56 4(b)(1) of the Act, 21 U.S.C. section 360bbb-3(b)(1), unless the authorization is terminated or revoked sooner. Performed at [facility], [place] [address], [place], [place] [address]   MRSA PCR Screening     Status: None   Collection Time: 08/29/19  1:34 AM   Specimen: Nasopharyngeal  Result Value Ref Range Status   MRSA by PCR NEGATIVE NEGATIVE Final    Comment:        The GeneXpert MRSA Assay (FDA approved for NASAL specimens only), is one component of a comprehensive MRSA colonization surveillance program. It is not intended to diagnose MRSA infection nor to guide or monitor treatment for MRSA infections. Performed at [facility], [place] [address] [place], [place] [address]          Radiology Studies: Dg Chest Port 1 View  Result Date: 09/02/2019 CLINICAL DATA:   Leukocytosis. EXAM: PORTABLE CHEST 1 VIEW COMPARISON:  January 04, 2019. FINDINGS: The heart size and mediastinal contours are within normal limits. Both lungs are clear. No pneumothorax or pleural effusion is noted. Right internal jugular catheter is noted with tip in expected position of cavoatrial junction. The visualized skeletal structures are unremarkable. IMPRESSION: Right internal jugular catheter is noted with tip in expected position of cavoatrial junction. No acute cardiopulmonary abnormality seen. Electronically Signed   By: Marijo Conception M.D.   On: 09/02/2019 12:16      Scheduled Meds: . Chlorhexidine Gluconate Cloth  6 each Topical Q0600  . darbepoetin (ARANESP) injection - DIALYSIS  25 mcg Intravenous Q Fri-HD  . feeding supplement (ENSURE ENLIVE)  237 mL Oral BID BM  . gentamicin cream  1  application Topical Daily  . heparin  5,000 Units Subcutaneous Q8H  . multivitamin  1 tablet Oral QHS  . pantoprazole  40 mg Oral BID  . sucralfate  1 g Oral TID WC & HS   Continuous Infusions: . dialysis solution 1.5% low-MG/low-CA       LOS: 6 days      Debbe Odea, MD [facility] Pager: [email or web address] Password [email or web address] 09/03/2019, 9:03 AM

## 2019-09-03 NOTE — Progress Notes (Signed)
Physical Therapy Treatment Patient Details Name: Eileen Perez MRN: [id number] DOB: Jun 16, 1941 Today's Date: 09/03/2019    History of Present Illness Pt is 78 yo female presenting with small bowel obstruction. PMH including thyroid disease, pancreatic cyst, hypertension, hyperlipidemia, GERD, diabetes mellitus, ESRD.    PT Comments    Pt remains very limited secondary to weakness and fatigue.  She required a sitting rest break halfway through ambulation, reporting her legs were weak. Pt would continue to benefit from skilled physical therapy services at this time while admitted and after d/c to address the below listed limitations in order to improve overall safety and independence with functional mobility.   Follow Up Recommendations  Home health PT     Equipment Recommendations  None recommended by PT    Recommendations for Other Services       Precautions / Restrictions Precautions Precautions: Fall Restrictions Weight Bearing Restrictions: No    Mobility  Bed Mobility               General bed mobility comments: pt OOB in recliner chair upon arrival  Transfers Overall transfer level: Needs assistance Equipment used: Rolling walker (2 wheeled) Transfers: Sit to/from Stand Sit to Stand: Supervision         General transfer comment: supervision for safety  Ambulation/Gait Ambulation/Gait assistance: Supervision Gait Distance (Feet): 200 Feet Assistive device: Rolling walker (2 wheeled) Gait Pattern/deviations: Step-through pattern;Decreased stride length;Trunk flexed Gait velocity: decreased   General Gait Details: supervision for safety, overall steady with RW; required one sitting rest break for several minutes secondary to fatigue   Stairs             Wheelchair Mobility    Modified Rankin (Stroke Patients Only)       Balance Overall balance assessment: Needs assistance Sitting-balance support: No upper extremity supported;Feet  supported Sitting balance-Leahy Scale: Good     Standing balance support: Single extremity supported;Bilateral upper extremity supported Standing balance-Leahy Scale: Poor                              Cognition Arousal/Alertness: Awake/alert Behavior During Therapy: WFL for tasks assessed/performed Overall Cognitive Status: Within Functional Limits for tasks assessed                                        Exercises      General Comments        Pertinent Vitals/Pain Pain Assessment: No/denies pain    Home Living                      Prior Function            PT Goals (current goals can now be found in the care plan section) Acute Rehab PT Goals PT Goal Formulation: With patient Time For Goal Achievement: 09/14/19 Potential to Achieve Goals: Good Progress towards PT goals: Progressing toward goals    Frequency    Min 3X/week      PT Plan Current plan remains appropriate    Co-evaluation              AM-PAC PT "6 Clicks" Mobility   Outcome Measure  Help needed turning from your back to your side while in a flat bed without using bedrails?: None Help needed moving from lying on your back to sitting on the  side of a flat bed without using bedrails?: A Little Help needed moving to and from a bed to a chair (including a wheelchair)?: A Little Help needed standing up from a chair using your arms (e.g., wheelchair or bedside chair)?: A Little Help needed to walk in hospital room?: A Little Help needed climbing 3-5 steps with a railing? : A Little 6 Click Score: 19    End of Session   Activity Tolerance: Patient tolerated treatment well Patient left: with call bell/phone within reach;Other (comment)(in bathroom to wash up) Nurse Communication: Mobility status;Other (comment)(pt in bathroom to wash up) PT Visit Diagnosis: Difficulty in walking, not elsewhere classified (R26.2)     Time: 6122-4497 PT Time  Calculation (min) (ACUTE ONLY): 21 min  Charges:  $Gait Training: 8-22 mins                     Sherie Don, [place], DPT  Acute Rehabilitation Services Pager [phone] Office [place] 09/03/2019, 5:45 PM

## 2019-09-03 NOTE — Progress Notes (Signed)
[facility] Progress Note   Subjective: Says she tried to eat breakfast but just couldn't even get down a piece of bacon. Still nauseated, no abdominal pain at present. No issues over night with PD cath-lines still connected-awaiting weight. Says she doesn't feel she can go home yet.   Vas cath removed 09/02/19 Objective Vitals:   09/02/19 1442 09/02/19 1715 09/02/19 2004 09/03/19 0443  BP: 117/70 127/74 114/69 116/73  Pulse: 84 86 84 84  Resp: 18 16 18 16   Temp: 97.9 F (36.6 C) 98.4 F (36.9 C) 98.3 F (36.8 C) (!) 97.5 F (36.4 C)  TempSrc: Oral Oral Oral Oral  SpO2: 100% 100% 98% 99%  Weight:  51.1 kg    Height:       Physical Exam General:Pleasant older female in NAD Heart:S1,S2 RRR Lungs:CTAB A/P Abdomen:Soft, NT, PD in dwell cycle, no issues. Extremities:No LE edema Dialysis Access:PD catheter,Drsg CDI.    Additional Objective Labs: Basic Metabolic Panel: Recent Labs  Lab 08/30/19 0733 09/01/19 0324 09/02/19 0837 09/03/19 0154  NA 137 138 134* 137  K 3.8 3.6 3.5 3.2*  CL 95* 99 92* 95*  CO2 21* 23 24 22   GLUCOSE 140* 100* 222* 180*  BUN 81* 60* 59* 58*  CREATININE 8.96* 8.17* 8.37* 8.12*  CALCIUM 10.0 10.2 10.0 9.8  PHOS 7.7*  --   --   --    Liver Function Tests: Recent Labs  Lab 08/28/19 1930 08/29/19 0754 08/30/19 0733  AST 13* 12*  --   ALT 14 12  --   ALKPHOS 93 84  --   BILITOT 0.9 0.9  --   PROT 7.3 6.3*  --   ALBUMIN 2.6* 2.5* 2.2*   Recent Labs  Lab 08/28/19 1930  LIPASE 22   CBC: Recent Labs  Lab 08/28/19 1930 08/29/19 0754 08/30/19 0733 09/01/19 0324 09/02/19 0837 09/03/19 0154  WBC 13.2* 15.2* 10.0 11.8* 16.4* 15.3*  NEUTROABS 11.5*  --   --   --   --   --   HGB 10.1* 9.8* 9.2* 9.9* 11.1* 10.9*  HCT 32.4* 30.2* 29.1* 31.1* 33.7* 34.3*  MCV 98.5 95.3 99.0 98.4 96.0 96.6  PLT 376 391 371 339 342 356   Blood Culture No results found for: SDES, SPECREQUEST, CULT, REPTSTATUS  Cardiac  Enzymes: No results for input(s): CKTOTAL, CKMB, CKMBINDEX, TROPONINI in the last 168 hours. CBG: Recent Labs  Lab 09/02/19 1618 09/02/19 2007 09/02/19 2340 09/03/19 0445 09/03/19 0830  GLUCAP 129* 185* 176* 191* 149*   Iron Studies: No results for input(s): IRON, TIBC, TRANSFERRIN, FERRITIN in the last 72 hours. @lablastinr3 @ Studies/Results: Dg Chest Port 1 View  Result Date: 09/02/2019 CLINICAL DATA:  Leukocytosis. EXAM: PORTABLE CHEST 1 VIEW COMPARISON:  January 04, 2019. FINDINGS: The heart size and mediastinal contours are within normal limits. Both lungs are clear. No pneumothorax or pleural effusion is noted. Right internal jugular catheter is noted with tip in expected position of cavoatrial junction. The visualized skeletal structures are unremarkable. IMPRESSION: Right internal jugular catheter is noted with tip in expected position of cavoatrial junction. No acute cardiopulmonary abnormality seen. Electronically Signed   By: Marijo Conception M.D.   On: 09/02/2019 12:16   Medications: . dialysis solution 1.5% low-MG/low-CA     . allopurinol  300 mg Oral QODAY  . Chlorhexidine Gluconate Cloth  6 each Topical Q0600  . darbepoetin (ARANESP) injection - DIALYSIS  25 mcg Intravenous Q Fri-HD  . feeding supplement (ENSURE ENLIVE)  237 mL Oral BID BM  . gentamicin cream  1 application Topical Daily  . heparin  5,000 Units Subcutaneous Q8H  . multivitamin  1 tablet Oral QHS  . pantoprazole  40 mg Oral BID     Dialysis Orders: CCPD, 7X Week, EDW 53 (kg) Ca 2.5 (mEq/L) Mg 0.5 (mEq/L) Dextrose 1.5; 2.5; 4.25 %, # Exchanges 5, fill vol 2500 mL, Dwell Time 2 hrs 0 min,    Assessment/Plan: 1. Partial SBO-Appears to be resolving. Has been advanced to full liquid diet.Surgery/GI following. 2. 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 Did not feel that systemic treatment was needed at that time. Unclear if that is related to recent events. Endo 09/02/19 .  LA grade  B esophagitis mid and distal esophagus as well as well esophagitis at GE junction, Diffuse gastritis prepyloric stomach. Per primary/GI. IF STARTED ON CARAFATE, SHOULD NOT CONTINUE LONGER THAN 2 WEEKS d/t INCREASED RISK OF ALUMINUM TOXICITY IN ESRD.  3. ESRD - Continue CCPD at current orders.PD cath was not functional in the setting of partial SBO and felt best to give her belly a break, S/p Vas Cath andhemodialysis times one on 10/10.PD catheter now functioning,had full treatment and will have vas cath removed and she can continue PD as previously ordered.SCr 8.12 BUN 58. Comparable to OP labs. K+ 3.2. Watch for hypokalemia.  4. Hypertension/volume - Home BP meds on hold D/T being NPO. BP is well controlled, no evidence of volume overload by exam.  5. Anemia -HBG 10.9 today. Did NOT receive ESA as ordered Friday. Fortunately not needed. Follow HGB.  6. Metabolic bone disease - Has been on Renvela packets 2.4grams TID AC and sensipar 30 mg daily. Corrected Castable at11.6- that has been a recent development - due to carcinoid ?is better. Resume medsat a later date. 7. Nutrition-Albumin low. Renal diet, add prostat, renal vits.   Varetta Chavers H. Kolyn Rozario NP-C 09/03/2019, 10:13 AM  [employer] [phone]

## 2019-09-04 ENCOUNTER — Ambulatory Visit: Payer: Medicare Other | Admitting: Podiatry

## 2019-09-04 DIAGNOSIS — D509 Iron deficiency anemia, unspecified: Secondary | ICD-10-CM | POA: Diagnosis not present

## 2019-09-04 DIAGNOSIS — Z4932 Encounter for adequacy testing for peritoneal dialysis: Secondary | ICD-10-CM | POA: Diagnosis not present

## 2019-09-04 DIAGNOSIS — D631 Anemia in chronic kidney disease: Secondary | ICD-10-CM | POA: Diagnosis not present

## 2019-09-04 DIAGNOSIS — Z23 Encounter for immunization: Secondary | ICD-10-CM | POA: Diagnosis not present

## 2019-09-04 DIAGNOSIS — N2589 Other disorders resulting from impaired renal tubular function: Secondary | ICD-10-CM | POA: Diagnosis not present

## 2019-09-04 DIAGNOSIS — N186 End stage renal disease: Secondary | ICD-10-CM | POA: Diagnosis not present

## 2019-09-04 DIAGNOSIS — E876 Hypokalemia: Secondary | ICD-10-CM | POA: Diagnosis not present

## 2019-09-04 DIAGNOSIS — N2581 Secondary hyperparathyroidism of renal origin: Secondary | ICD-10-CM | POA: Diagnosis not present

## 2019-09-04 DIAGNOSIS — R82998 Other abnormal findings in urine: Secondary | ICD-10-CM | POA: Diagnosis not present

## 2019-09-04 DIAGNOSIS — E119 Type 2 diabetes mellitus without complications: Secondary | ICD-10-CM | POA: Diagnosis not present

## 2019-09-04 DIAGNOSIS — Z992 Dependence on renal dialysis: Secondary | ICD-10-CM | POA: Diagnosis not present

## 2019-09-04 LAB — CBC
HCT: 35.4 % — ABNORMAL LOW (ref 36.0–46.0)
Hemoglobin: 11.4 g/dL — ABNORMAL LOW (ref 12.0–15.0)
MCH: 31.2 pg (ref 26.0–34.0)
MCHC: 32.2 g/dL (ref 30.0–36.0)
MCV: 97 fL (ref 80.0–100.0)
Platelets: 345 10*3/uL (ref 150–400)
RBC: 3.65 MIL/uL — ABNORMAL LOW (ref 3.87–5.11)
RDW: 16.6 % — ABNORMAL HIGH (ref 11.5–15.5)
WBC: 17.4 10*3/uL — ABNORMAL HIGH (ref 4.0–10.5)
nRBC: 0.2 % (ref 0.0–0.2)

## 2019-09-04 LAB — GLUCOSE, CAPILLARY
Glucose-Capillary: 189 mg/dL — ABNORMAL HIGH (ref 70–99)
Glucose-Capillary: 149 mg/dL — ABNORMAL HIGH (ref 70–99)
Glucose-Capillary: 190 mg/dL — ABNORMAL HIGH (ref 70–99)
Glucose-Capillary: 185 mg/dL — ABNORMAL HIGH (ref 70–99)

## 2019-09-04 MED ORDER — SUCRALFATE 1 GM/10ML PO SUSP
1.0000 g | Freq: Three times a day (TID) | ORAL | Status: DC
  Administered 2019-09-04 – 2019-09-06 (×10): 1 g via ORAL
  Filled 2019-09-04 (×9): qty 10

## 2019-09-04 NOTE — Progress Notes (Addendum)
[facility] Progress Note   Subjective: Still not eating, PO intake liquids. Losing body weight. No issues with CCPD overnight.     Objective Vitals:   09/03/19 2046 09/04/19 0411 09/04/19 1036 09/04/19 1114  BP: 118/67 117/73    Pulse: 95 87    Resp: 18 16    Temp: 98.6 F (37 C) 97.8 F (36.6 C)    TempSrc: Oral Oral  Oral  SpO2: 99% 100%    Weight:   47.1 kg   Height:       Physical Exam General:Pleasant older female in NAD Heart:S1,S2 RRR Lungs:CTAB A/P Abdomen:Soft, NT, active BS.  Extremities:No LE edema Dialysis Access:PD catheter,Drsg CDI.     Additional Objective Labs: Basic Metabolic Panel: Recent Labs  Lab 08/30/19 0733 09/01/19 0324 09/02/19 0837 09/03/19 0154  NA 137 138 134* 137  K 3.8 3.6 3.5 3.2*  CL 95* 99 92* 95*  CO2 21* 23 24 22   GLUCOSE 140* 100* 222* 180*  BUN 81* 60* 59* 58*  CREATININE 8.96* 8.17* 8.37* 8.12*  CALCIUM 10.0 10.2 10.0 9.8  PHOS 7.7*  --   --   --    Liver Function Tests: Recent Labs  Lab 08/28/19 1930 08/29/19 0754 08/30/19 0733  AST 13* 12*  --   ALT 14 12  --   ALKPHOS 93 84  --   BILITOT 0.9 0.9  --   PROT 7.3 6.3*  --   ALBUMIN 2.6* 2.5* 2.2*   Recent Labs  Lab 08/28/19 1930  LIPASE 22   CBC: Recent Labs  Lab 08/28/19 1930  08/30/19 0733 09/01/19 0324 09/02/19 0837 09/03/19 0154 09/04/19 1015  WBC 13.2*   < > 10.0 11.8* 16.4* 15.3* 17.4*  NEUTROABS 11.5*  --   --   --   --   --   --   HGB 10.1*   < > 9.2* 9.9* 11.1* 10.9* 11.4*  HCT 32.4*   < > 29.1* 31.1* 33.7* 34.3* 35.4*  MCV 98.5   < > 99.0 98.4 96.0 96.6 97.0  PLT 376   < > 371 339 342 356 345   < > = values in this interval not displayed.   Blood Culture No results found for: SDES, SPECREQUEST, CULT, REPTSTATUS  Cardiac Enzymes: No results for input(s): CKTOTAL, CKMB, CKMBINDEX, TROPONINI in the last 168 hours. CBG: Recent Labs  Lab 09/03/19 0830 09/03/19 1220 09/03/19 1706 09/03/19 2049 09/04/19 0803   GLUCAP 149* 140* 163* 138* 189*   Iron Studies: No results for input(s): IRON, TIBC, TRANSFERRIN, FERRITIN in the last 72 hours. @lablastinr3 @ Studies/Results: No results found. Medications: . dialysis solution 1.5% low-MG/low-CA     . allopurinol  300 mg Oral QODAY  . Chlorhexidine Gluconate Cloth  6 each Topical Q0600  . darbepoetin (ARANESP) injection - DIALYSIS  25 mcg Intravenous Q Fri-HD  . feeding supplement (ENSURE ENLIVE)  237 mL Oral QHS  . feeding supplement (NEPRO CARB STEADY)  237 mL Oral BID BM  . gentamicin cream  1 application Topical Daily  . heparin  5,000 Units Subcutaneous Q8H  . multivitamin  1 tablet Oral QHS  . pantoprazole  40 mg Oral BID     Dialysis Orders: CCPD, 7X Week, EDW 53 (kg) Ca 2.5 (mEq/L) Mg 0.5 (mEq/L) Dextrose 1.5; 2.5; 4.25 %, # Exchanges 5, fill vol 2500 mL, Dwell Time 2 hrs 0 min,    Assessment/Plan: 1. Partial SBO-Appears to be resolving. Has been advanced to full liquid  diet.Surgery/GI following. 2. 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 Did not feel that systemic treatment was needed at that time. Unclear if that is related to recent events. Endo 09/02/19 .LA grade  B esophagitis mid and distal esophagus as well as well esophagitis at GE junction, Diffuse gastritis prepyloric stomach. Per primary/GI. IF STARTED ON CARAFATE, SHOULD NOT CONTINUE LONGER THAN 2 WEEKS d/t INCREASED RISK OF ALUMINUM TOXICITY IN ESRD. Should continue carafate for at least 2 weeks. Liberalize diet and allow pt to choose what food she thinks she can eat. Discussed with primary. 3. ESRD - Continue CCPD at current orders.PD cath was not functional in the setting of partial SBO and felt best to give her belly a break, S/p Vas Cath andhemodialysis times one on 10/10.PD catheter now functioning,hadfull treatment and will have vas cath removed and she can continue PD as previously ordered.SCr 8.12 BUN 58 09/03/19. Comparable to OP  labs. K+ 3.2. Watch for hypokalemia.  4. Hypertension/volume - Home BP meds on hold D/T being NPO. BP is well controlled, no evidence of volume overload by exam.  5. Anemia -HBG 11.4 today. Did NOT receive ESA as ordered Friday. Fortunately not needed. Follow HGB.  6. Metabolic bone disease - Has been on Renvela packets 2.4grams TID AC and sensipar 30 mg daily. Corrected Castable at11.6- that has been a recent development - due to carcinoid ?is better. Resume medsat a later date.RFP with AM labs tomorrow.  7. Nutrition-Albumin low. Renal diet, add prostat, renal vits.   Rita H. Brown NP-C 09/04/2019, 11:25 AM  [employer] [phone]

## 2019-09-04 NOTE — Plan of Care (Signed)
  Problem: Activity: Goal: Risk for activity intolerance will decrease Outcome: Progressing   Problem: Pain Managment: Goal: General experience of comfort will improve Outcome: Progressing   Problem: Skin Integrity: Goal: Risk for impaired skin integrity will decrease Outcome: Progressing   

## 2019-09-04 NOTE — Progress Notes (Signed)
[facility] Progress Note  Eileen Perez 78 y.o. [birth year]  CC: Small bowel obstruction, abnormal CT scan showing antral thickening   Subjective: Patient was doing fine yesterday evening but subsequently started having nausea at this morning.  Last bowel movement yesterday.  Denies abdominal pain   ROS : Negative for acute chest pain and shortness of breath  Objective: Vital signs in last 24 hours: Vitals:   09/04/19 0411 09/04/19 1128  BP: 117/73 123/71  Pulse: 87 82  Resp: 16 16  Temp: 97.8 F (36.6 C) 98.3 F (36.8 C)  SpO2: 100% 100%    Physical Exam:  General:  Alert, cooperative, no distress, appears stated age  Head:  Normocephalic, without obvious abnormality, atraumatic  Eyes:  , EOM's intact,   Lungs:   Clear to auscultation bilaterally, respirations unlabored  Heart:  Regular rate and rhythm, S1, S2 normal  Abdomen:    Soft, nondistended, nontender, bowel sounds present.  No peritoneal signs  Extremities: Extremities normal, atraumatic, no  edema       Lab Results: Recent Labs    09/02/19 0837 09/03/19 0154  NA 134* 137  K 3.5 3.2*  CL 92* 95*  CO2 24 22  GLUCOSE 222* 180*  BUN 59* 58*  CREATININE 8.37* 8.12*  CALCIUM 10.0 9.8   No results for input(s): AST, ALT, ALKPHOS, BILITOT, PROT, ALBUMIN in the last 72 hours. Recent Labs    09/03/19 0154 09/04/19 1015  WBC 15.3* 17.4*  HGB 10.9* 11.4*  HCT 34.3* 35.4*  MCV 96.6 97.0  PLT 356 345   No results for input(s): LABPROT, INR in the last 72 hours.    Assessment/Plan: Partial small bowel obstruction.  Improving Abnormal CT scan showing diffuse wall thickening in the antrum and pyloric region of the stomach. Gastric carcinoid and adenoma.  EGD in June 2020 showed 15 mm polyp in the gastric body/near antrum.  It was removed with hot snare.  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  Patient also had few nodules in the duodenum.  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 End-stage renal disease.  On peritoneal dialysis. Chronic anemia  -EGD 10/13/ showed LA grade B esophagitis in mid and distal esophagus as well as esophagitis at GE junction. Previously noted tattoo was seen in the antrum without any residual polyp.  Multiple biopsies from different segments were taken.It also showed diffuse gastritis and multiple biopsies from prepyloric stomach and gastric body were taken and placed in a separate jar. -Biopsies from body and fundus showed metaplasia without dysplasia.  Recommendations ------------------------ -Biopsy findings discussed with the patient. -Appreciate nephrology input.  Okay to take sucralfate for 2 weeks.  Recommend pantoprazole 40 mg twice a day for 6 weeks after discharge. -May need repeat EGD in 2 to 3 months. -Advance diet slowly as tolerated -No further inpatient GI work-up planned.  GI will sign off.  Call us back if needed.  Follow-up in GI clinic in 4 to 6 weeks.  Otis Brace MD, [place] 09/04/2019, 11:49 AM  Contact #  [phone]

## 2019-09-04 NOTE — Progress Notes (Addendum)
PROGRESS NOTE    Eileen Perez   JJO:[id number]  DOB: [birth year]  DOA: 08/28/2019 PCP: Unk Pinto, MD   Brief Narrative:  Eileen Perez is a 78 year old with end-stage renal disease on peritoneal dialysis, diabetes mellitus, hypertension, hyperlipidemia, gastroesophageal reflux, pancreatic cyst, carcinoid tumor being followed by Dr. Benay Spice with no evidence of metastasis.-She presented to the ED with nausea. 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 GI consulted on 10/9 for thickening noted in gastric area  Subjective: She continues to vomit and is not drinking much. She had a small amount of ensure yesterday and did not have anything else to drink.   Assessment & Plan:   Principal Problem:   Small bowel obstruction  -Partial small bowel obstruction on admission treated conservatively with bowel rest -Resolved spontaneously-tolerating clears by 10/12  Active Problems:     Carcinoid tumor of stomach  -  increased thickening of gastric antrum and pyloric areas noted on CT now with 1 month of nausea -Previous carcinoid tumor involved a resected gastric polyp was no evidence of metastatic disease and no indication for systemic therapy-she was recommended by Dr. Benay Spice to follow-up with Dr.Brahmbatt for surveillance EGD -Dr. Arbie Cookey plan to follow her up 8 months after July's visit -GI was consulted on  10/9 as mentioned -The patient underwent an EGD 13 which revealed the following: 1 space LA grade B esophagitis in the mid and distal esophagus as well as esophagitis and GE junction 2  diffuse gastritis -Biopsies taken from various areas will need to be followed by GI -GI plans for twice daily PPI follow and repeat up endoscopy in 3 months -Slowly advance diet from current clear liquids - still not tolerating enough liquids and not eating solids at all - cont to follow for improvement- holding off on IVF as she is a dialysis patient- will allow renal to decide if fluids should be start- I have d/c'd Tramaol in case this is causing part of the nausea  Leukocytosis - no symptoms of infection at this time. No fevers. Follow    ESRD (end stage renal disease)   -Undergoes peritoneal dialysis at home -Appreciate nephrology- follow-up in the hospital -Status post Vas-Cath and hemodialysis 1 time on 10/10 due to problems with peritoneal dialysis -continue CCPD per nephrology    Essential hypertension -Home meds include hydralazine & labetalol -BP not elevated and thus meds on hold  Gout -Resumed allopurinol    Protein-calorie malnutrition, severe - cont dietary supplements  Diabetes mellitus 2-diet controlled Not on any medications at home-last A1c was 6.5 when checked in 4/29-her sugars in the hospital are not very high  Time spent in minutes: 30 DVT prophylaxis: Heparin Code Status: Full code Family Communication:  Disposition Plan: Continue to follow oral intake Home with home health when ready to discharge Consultants:   Nephrology  GI  General surgery Procedures:   EGD 10/13 - LA Grade D esophagitis. - Non-obstructing Schatzki ring. - A tattoo was seen in the gastric antrum (  greater curvature). A post-polypectomy scar was found at the tattoo site. Biopsied. - Gastritis. Biopsied. - Gastritis. Biopsied. - Normal duodenal bulb, first portion of the duodenum and second  portion of the duodenum. Antimicrobials:  Anti-infectives (From admission, onward)   None       Objective: Vitals:   09/03/19 1042 09/03/19 1411 09/03/19 2046 09/04/19 0411  BP: 119/70 (!) 164/96 118/67 117/73  Pulse: 81 94 95 87  Resp: 18 18 18 16   Temp: 98.2 F (36.8 C) 97.8 F (36.6 C) 98.6 F (37 C) 97.8 F (36.6 C)  TempSrc: Oral Oral Oral Oral  SpO2: 99% 100% 99% 100%  Weight: 49.6 kg     Height:        Intake/Output Summary (Last 24 hours) at 09/04/2019 0948 Last data filed at 09/04/2019 0828 Gross per 24 hour  Intake 10249 ml  Output 341 ml  Net 9908 ml   Filed Weights   09/02/19 0954 09/02/19 1715 09/03/19 1042  Weight: 50 kg 51.1 kg 49.6 kg    Examination: General exam: Appears comfortable  HEENT: PERRLA, oral mucosa moist, no sclera icterus or thrush Respiratory system: Clear to auscultation. Respiratory effort normal. Cardiovascular system: S1 & S2 heard,  No murmurs  Gastrointestinal system: Abdomen soft, mid abdominal tenderness, nondistended. Normal bowel sounds   Central nervous system: Alert and oriented. No focal neurological deficits. Extremities: No cyanosis, clubbing or edema Skin: No rashes or ulcers Psychiatry:  Mood & affect appropriate.     Data Reviewed: I have personally reviewed following labs and imaging studies  CBC: Recent Labs  Lab 08/28/19 1930 08/29/19 0754 08/30/19 0733 09/01/19 0324 09/02/19 0837 09/03/19 0154  WBC 13.2* 15.2* 10.0 11.8* 16.4* 15.3*  NEUTROABS 11.5*  --   --   --   --   --   HGB 10.1* 9.8* 9.2* 9.9* 11.1* 10.9*  HCT 32.4* 30.2* 29.1* 31.1* 33.7* 34.3*  MCV 98.5 95.3 99.0 98.4 96.0 96.6  PLT 376 391 371 339 342 790   Basic Metabolic Panel: Recent Labs  Lab 08/29/19 0754 08/30/19 0733 09/01/19 0324 09/02/19 0837 09/03/19 0154  NA 136 137 138 134* 137  K 4.2 3.8 3.6 3.5 3.2*  CL 94* 95* 99 92* 95*  CO2 20* 21* 23 24 22   GLUCOSE 129* 140* 100* 222* 180*  BUN 89* 81* 60* 59* 58*   CREATININE 10.77* 8.96* 8.17* 8.37* 8.12*  CALCIUM 10.4* 10.0 10.2 10.0 9.8  PHOS  --  7.7*  --   --   --    GFR: Estimated Creatinine Clearance: 4 mL/min (A) (by C-G formula based on SCr of 8.12 mg/dL (H)). Liver Function Tests: Recent Labs  Lab 08/28/19 1930 08/29/19 0754 08/30/19 0733  AST 13* 12*  --   ALT 14 12  --   ALKPHOS 93 84  --   BILITOT 0.9 0.9  --   PROT 7.3 6.3*  --   ALBUMIN 2.6* 2.5* 2.2*   Recent Labs  Lab 08/28/19 1930  LIPASE 22   No results for input(s): AMMONIA in the last 168 hours. Coagulation Profile: No results for input(s): INR, PROTIME in the last 168 hours. Cardiac Enzymes: No results for input(s): CKTOTAL, CKMB, CKMBINDEX, TROPONINI in the last 168 hours. BNP (last 3 results) No results for input(s): PROBNP in the last 8760 hours. HbA1C: No results for input(s): HGBA1C in the last 72 hours. CBG: Recent Labs  Lab 09/03/19 0830 09/03/19 1220 09/03/19 1706 09/03/19 2049 09/04/19 [place]  149* 140* 163* 138* 189*   Lipid Profile: No results for input(s): CHOL, HDL, LDLCALC, TRIG, CHOLHDL, LDLDIRECT in the last 72 hours. Thyroid Function Tests: No results for input(s): TSH, T4TOTAL, FREET4, T3FREE, THYROIDAB in the last 72 hours. Anemia Panel: No results for input(s): VITAMINB12, FOLATE, FERRITIN, TIBC, IRON, RETICCTPCT in the last 72 hours. Urine analysis:    Component Value Date/Time   COLORURINE YELLOW 03/25/2019 1548   APPEARANCEUR TURBID (A) 03/25/2019 1548   LABSPEC 1.007 03/25/2019 1548   PHURINE 7.0 03/25/2019 1548   GLUCOSEU NEGATIVE 03/25/2019 1548   HGBUR 2+ (A) 03/25/2019 1548   BILIRUBINUR NEGATIVE 12/08/2016 1115   KETONESUR NEGATIVE 03/25/2019 1548   PROTEINUR 2+ (A) 03/25/2019 1548   UROBILINOGEN 0.2 05/19/2015 0942   NITRITE NEGATIVE 03/25/2019 1548   LEUKOCYTESUR 3+ (A) 03/25/2019 1548   Sepsis Labs: @LABRCNTIP (procalcitonin:4,lacticidven:4) ) Recent Results (from the past 240 hour(s))  SARS  CORONAVIRUS 2 (TAT 6-24 HRS) Nasopharyngeal Nasopharyngeal Swab     Status: None   Collection Time: 08/28/19  9:25 PM   Specimen: Nasopharyngeal Swab  Result Value Ref Range Status   SARS Coronavirus 2 NEGATIVE NEGATIVE Final    Comment: (NOTE) SARS-CoV-2 target nucleic acids are NOT DETECTED. The SARS-CoV-2 RNA is generally detectable in upper and lower respiratory specimens during the acute phase of infection. Negative results do not preclude SARS-CoV-2 infection, do not rule out co-infections with other pathogens, and should not be used as the sole basis for treatment or other patient management decisions. Negative results must be combined with clinical observations, patient history, and epidemiological information. The expected result is Negative. Fact Sheet for Patients: SugarRoll.be Fact Sheet for Healthcare Providers: https://www.woods-mathews.com/ This test is not yet approved or cleared by the [place] FDA and  has been authorized for detection and/or diagnosis of SARS-CoV-2 by FDA under an Emergency Use Authorization (EUA). This EUA will remain  in effect (meaning this test can be used) for the duration of the COVID-19 declaration under Section 56 4(b)(1) of the Act, 21 U.S.C. section 360bbb-3(b)(1), unless the authorization is terminated or revoked sooner. Performed at [facility], [place] [address], [place], [place] [address]   MRSA PCR Screening     Status: None   Collection Time: 08/29/19  1:34 AM   Specimen: Nasopharyngeal  Result Value Ref Range Status   MRSA by PCR NEGATIVE NEGATIVE Final    Comment:        The GeneXpert MRSA Assay (FDA approved for NASAL specimens only), is one component of a comprehensive MRSA colonization surveillance program. It is not intended to diagnose MRSA infection nor to guide or monitor treatment for MRSA infections. Performed at [facility], [place] [address],  [place], [place] [address]          Radiology Studies: No results found.    Scheduled Meds: . allopurinol  300 mg Oral QODAY  . Chlorhexidine Gluconate Cloth  6 each Topical Q0600  . darbepoetin (ARANESP) injection - DIALYSIS  25 mcg Intravenous Q Fri-HD  . feeding supplement (ENSURE ENLIVE)  237 mL Oral QHS  . feeding supplement (NEPRO CARB STEADY)  237 mL Oral BID BM  . gentamicin cream  1 application Topical Daily  . heparin  5,000 Units Subcutaneous Q8H  . multivitamin  1 tablet Oral QHS  . pantoprazole  40 mg Oral BID   Continuous Infusions: . dialysis solution 1.5% low-MG/low-CA       LOS: 7 days  Debbe Odea, MD [facility] Pager: [email or web address] Password [email or web address] 09/04/2019, 9:48 AM

## 2019-09-04 NOTE — Progress Notes (Signed)
Occupational Therapy Treatment Patient Details Name: Eileen Perez MRN: [id number] DOB: Sep 03, 1941 Today's Date: 09/04/2019    History of present illness Pt is 78 yo female presenting with small bowel obstruction. PMH including thyroid disease, pancreatic cyst, hypertension, hyperlipidemia, GERD, diabetes mellitus, ESRD.   OT comments  Pt making steady progress towards OT goals this session. Pt supervision for LB ADLs EOB.  Session focus on functional mobility as precursor to higher level ADLs. Pt reports she remembers the theraputty exercises and feels like they are just the right challenge, but hasn't been doing them today d/t nausea. Pt complete simulated toilet transfer after functional mobility in hallway with min guard assist with RW. Pt declined further ADLs d/t persistent nausea. DC plan remains appropriate. Will continue to follow acutely for OT needs.    Follow Up Recommendations  No OT follow up    Equipment Recommendations  3 in 1 bedside commode    Recommendations for Other Services      Precautions / Restrictions Restrictions Weight Bearing Restrictions: No       Mobility Bed Mobility Overal bed mobility: Modified Independent             General bed mobility comments: no assist needed; use of bed rials and elevated HOB  Transfers   Equipment used: Rolling walker (2 wheeled) Transfers: Sit to/from Stand Sit to Stand: Supervision         General transfer comment: supervision for safety    Balance Overall balance assessment: Needs assistance Sitting-balance support: No upper extremity supported;Feet supported Sitting balance-Leahy Scale: Good Sitting balance - Comments: able to pull up socks from EOB   Standing balance support: Bilateral upper extremity supported Standing balance-Leahy Scale: Poor Standing balance comment: reliant on BUE for balance during functional mobility                           ADL either performed or assessed  with clinical judgement   ADL                       Lower Body Dressing: Supervision/safety;Sitting/lateral leans Lower Body Dressing Details (indicate cue type and reason): supervision to figure four EOB to pull up socks Toilet Transfer: Min guard;Ambulation;BSC;Regular Toilet           Functional mobility during ADLs: Min guard;Rolling walker General ADL Comments: session focus on functional mobility; supervision for LB ADL at EOB; limited by nausea     Vision Patient Visual Report: No change from baseline     Perception     Praxis      Cognition Arousal/Alertness: Awake/alert Behavior During Therapy: WFL for tasks assessed/performed Overall Cognitive Status: Within Functional Limits for tasks assessed                                 General Comments: pleasant and motivated        Exercises     Shoulder Instructions       General Comments      Pertinent Vitals/ Pain       Pain Assessment: (c/o nausea vs. pain) Faces Pain Scale: Hurts a little bit Pain Location: persistent nausea Pain Descriptors / Indicators: Discomfort;Constant Pain Intervention(s): Limited activity within patient's tolerance;Monitored during session;Repositioned  Home Living  Prior Functioning/Environment              Frequency  Min 3X/week        Progress Toward Goals  OT Goals(current goals can now be found in the care plan section)  Progress towards OT goals: Progressing toward goals  Acute Rehab OT Goals Patient Stated Goal: get back to normal OT Goal Formulation: With patient Time For Goal Achievement: 09/13/19 Potential to Achieve Goals: Good  Plan Discharge plan remains appropriate    Co-evaluation                 AM-PAC OT "6 Clicks" Daily Activity     Outcome Measure   Help from another person eating meals?: None   Help from another person toileting, which includes  using toliet, bedpan, or urinal?: A Little Help from another person bathing (including washing, rinsing, drying)?: None Help from another person to put on and taking off regular upper body clothing?: None Help from another person to put on and taking off regular lower body clothing?: None 6 Click Score: 19    End of Session Equipment Utilized During Treatment: Gait belt;Rolling walker  OT Visit Diagnosis: Unsteadiness on feet (R26.81);Other abnormalities of gait and mobility (R26.89);Muscle weakness (generalized) (M62.81);Pain   Activity Tolerance Patient tolerated treatment well   Patient Left in chair;with call bell/phone within reach   Nurse Communication Mobility status        Time: 7782-4235 OT Time Calculation (min): 17 min  Charges: OT General Charges $OT Visit: 1 Visit OT Treatments $Self Care/Home Management : 8-22 mins  [place], [place] [phone] [place] 09/04/2019, 2:30 PM

## 2019-09-05 ENCOUNTER — Inpatient Hospital Stay (HOSPITAL_COMMUNITY): Payer: Medicare Other

## 2019-09-05 DIAGNOSIS — Z4932 Encounter for adequacy testing for peritoneal dialysis: Secondary | ICD-10-CM | POA: Diagnosis not present

## 2019-09-05 DIAGNOSIS — R82998 Other abnormal findings in urine: Secondary | ICD-10-CM | POA: Diagnosis not present

## 2019-09-05 DIAGNOSIS — E119 Type 2 diabetes mellitus without complications: Secondary | ICD-10-CM | POA: Diagnosis not present

## 2019-09-05 DIAGNOSIS — Z992 Dependence on renal dialysis: Secondary | ICD-10-CM | POA: Diagnosis not present

## 2019-09-05 DIAGNOSIS — D631 Anemia in chronic kidney disease: Secondary | ICD-10-CM | POA: Diagnosis not present

## 2019-09-05 DIAGNOSIS — N2581 Secondary hyperparathyroidism of renal origin: Secondary | ICD-10-CM | POA: Diagnosis not present

## 2019-09-05 DIAGNOSIS — Z23 Encounter for immunization: Secondary | ICD-10-CM | POA: Diagnosis not present

## 2019-09-05 DIAGNOSIS — D509 Iron deficiency anemia, unspecified: Secondary | ICD-10-CM | POA: Diagnosis not present

## 2019-09-05 DIAGNOSIS — E876 Hypokalemia: Secondary | ICD-10-CM | POA: Diagnosis not present

## 2019-09-05 DIAGNOSIS — N2589 Other disorders resulting from impaired renal tubular function: Secondary | ICD-10-CM | POA: Diagnosis not present

## 2019-09-05 DIAGNOSIS — N186 End stage renal disease: Secondary | ICD-10-CM | POA: Diagnosis not present

## 2019-09-05 LAB — RENAL FUNCTION PANEL
Albumin: 2.4 g/dL — ABNORMAL LOW (ref 3.5–5.0)
Anion gap: 21 — ABNORMAL HIGH (ref 5–15)
BUN: 63 mg/dL — ABNORMAL HIGH (ref 8–23)
CO2: 21 mmol/L — ABNORMAL LOW (ref 22–32)
Calcium: 9.8 mg/dL (ref 8.9–10.3)
Chloride: 92 mmol/L — ABNORMAL LOW (ref 98–111)
Creatinine, Ser: 8.33 mg/dL — ABNORMAL HIGH (ref 0.44–1.00)
GFR calc Af Amer: 5 mL/min — ABNORMAL LOW (ref >60)
GFR calc non Af Amer: 4 mL/min — ABNORMAL LOW (ref >60)
Glucose, Bld: 183 mg/dL — ABNORMAL HIGH (ref 70–99)
Phosphorus: 5.8 mg/dL — ABNORMAL HIGH (ref 2.5–4.6)
Potassium: 3.2 mmol/L — ABNORMAL LOW (ref 3.5–5.1)
Sodium: 134 mmol/L — ABNORMAL LOW (ref 135–145)

## 2019-09-05 LAB — GLUCOSE, CAPILLARY
Glucose-Capillary: 164 mg/dL — ABNORMAL HIGH (ref 70–99)
Glucose-Capillary: 148 mg/dL — ABNORMAL HIGH (ref 70–99)
Glucose-Capillary: 155 mg/dL — ABNORMAL HIGH (ref 70–99)
Glucose-Capillary: 215 mg/dL — ABNORMAL HIGH (ref 70–99)

## 2019-09-05 MED ORDER — POTASSIUM CHLORIDE CRYS ER 20 MEQ PO TBCR
20.0000 meq | EXTENDED_RELEASE_TABLET | Freq: Once | ORAL | Status: AC
  Administered 2019-09-05: 13:00:00 20 meq via ORAL
  Filled 2019-09-05: qty 1

## 2019-09-05 NOTE — Plan of Care (Signed)
  Problem: Health Behavior/Discharge Planning: Goal: Ability to manage health-related needs will improve Outcome: Progressing   

## 2019-09-05 NOTE — Progress Notes (Signed)
Physical Therapy Treatment Patient Details Name: Eileen Perez MRN: [id number] DOB: Aug 05, 1941 Today's Date: 09/05/2019    History of Present Illness Pt is 78 yo female presenting with small bowel obstruction. PMH including thyroid disease, pancreatic cyst, hypertension, hyperlipidemia, GERD, diabetes mellitus, ESRD.    PT Comments    Pt very limited this session secondary to persistent nausea. Pt's breakfast present and pt sitting at EOB in an attempt to eat when PT arrived; however, pt frequently lying down and sitting back up in an attempt to find a comfortable position. Despite encouragement, pt refusing to sit OOB in recliner chair secondary to her unrelenting nausea. Pt performing ankle pumps at EOB and therapist encouraging pt to perform throughout the day as well as she is not moving around very much. PT also reapplied SCD's to pt's bilateral LEs. Pt would continue to benefit from skilled physical therapy services at this time while admitted and after d/c to address the below listed limitations in order to improve overall safety and independence with functional mobility.    Follow Up Recommendations  Home health PT     Equipment Recommendations  None recommended by PT    Recommendations for Other Services       Precautions / Restrictions Precautions Precautions: Fall Restrictions Weight Bearing Restrictions: No    Mobility  Bed Mobility Overal bed mobility: Needs Assistance Bed Mobility: Sidelying to Sit   Sidelying to sit: Supervision       General bed mobility comments: pt performing sidelying to sit and back to sidelying multiple times throughout with supervision for safety in an attempt to find a more comfortable position  Transfers                 General transfer comment: pt refusing at this time secondary to persistent nausea  Ambulation/Gait                 Stairs             Wheelchair Mobility    Modified Rankin (Stroke  Patients Only)       Balance Overall balance assessment: Needs assistance Sitting-balance support: Feet supported Sitting balance-Leahy Scale: Good Sitting balance - Comments: pt sitting up at EOB ~10 minutes intermittently; needing to lie back down frequently secondary to nausea                                    Cognition Arousal/Alertness: Awake/alert Behavior During Therapy: WFL for tasks assessed/performed Overall Cognitive Status: Within Functional Limits for tasks assessed                                        Exercises General Exercises - Lower Extremity Ankle Circles/Pumps: AROM;Both;20 reps;Seated    General Comments        Pertinent Vitals/Pain Pain Assessment: Faces Faces Pain Scale: Hurts little more Pain Location: stomach and persistent nausea Pain Descriptors / Indicators: Discomfort;Constant Pain Intervention(s): Monitored during session;Repositioned    Home Living                      Prior Function            PT Goals (current goals can now be found in the care plan section) Acute Rehab PT Goals PT Goal Formulation: With patient Time For Goal Achievement:  09/14/19 Potential to Achieve Goals: Good Progress towards PT goals: Progressing toward goals    Frequency    Min 3X/week      PT Plan Current plan remains appropriate    Co-evaluation              AM-PAC PT "6 Clicks" Mobility   Outcome Measure  Help needed turning from your back to your side while in a flat bed without using bedrails?: None Help needed moving from lying on your back to sitting on the side of a flat bed without using bedrails?: A Little Help needed moving to and from a bed to a chair (including a wheelchair)?: A Little Help needed standing up from a chair using your arms (e.g., wheelchair or bedside chair)?: A Little Help needed to walk in hospital room?: A Little Help needed climbing 3-5 steps with a railing? : A  Little 6 Click Score: 19    End of Session   Activity Tolerance: Patient tolerated treatment well Patient left: in bed;with call bell/phone within reach Nurse Communication: Mobility status PT Visit Diagnosis: Difficulty in walking, not elsewhere classified (R26.2)     Time: 1660-6004 PT Time Calculation (min) (ACUTE ONLY): 16 min  Charges:  $Therapeutic Activity: 8-22 mins                     Sherie Don, PT, DPT  Acute Rehabilitation Services Pager [phone] Office [place] 09/05/2019, 11:28 AM

## 2019-09-05 NOTE — Progress Notes (Signed)
[name] Progress Note   Subjective:  Seen in room - no issues with PD overnight. Abd was feeling better, but now some pain again this morning. Full breakfast tray in front of her - hasn't touched it yet. BM last night. Tells me plan is likely for d/c tomorrow - needs more PD supplies, called her unit to arrange.  Objective Vitals:   09/04/19 2144 09/05/19 0519 09/05/19 0547 09/05/19 0755  BP: 99/62 97/61 97/60  111/64  Pulse: 96 90 87 86  Resp:   18 16  Temp: 97.6 F (36.4 C) 97.7 F (36.5 C) 97.9 F (36.6 C) 97.8 F (36.6 C)  TempSrc: Oral Oral Oral Oral  SpO2: 100% 99% 98% 99%  Weight:    48.4 kg  Height:       Physical Exam General: Thin, elderly woman, NAD Heart: RRR; no murmur Lungs: CTAB Abdomen: soft, non-tender Extremities: No LE edema Dialysis Access:  PD cath  Additional Objective Labs: Basic Metabolic Panel: Recent Labs  Lab 08/30/19 0733  09/02/19 0837 09/03/19 0154 09/05/19 0654  NA 137   < > 134* 137 134*  K 3.8   < > 3.5 3.2* 3.2*  CL 95*   < > 92* 95* 92*  CO2 21*   < > 24 22 21*  GLUCOSE 140*   < > 222* 180* 183*  BUN 81*   < > 59* 58* 63*  CREATININE 8.96*   < > 8.37* 8.12* 8.33*  CALCIUM 10.0   < > 10.0 9.8 9.8  PHOS 7.7*  --   --   --  5.8*   < > = values in this interval not displayed.   Liver Function Tests: Recent Labs  Lab 08/30/19 0733 09/05/19 0654  ALBUMIN 2.2* 2.4*   CBC: Recent Labs  Lab 08/30/19 0733 09/01/19 0324 09/02/19 0837 09/03/19 0154 09/04/19 1015  WBC 10.0 11.8* 16.4* 15.3* 17.4*  HGB 9.2* 9.9* 11.1* 10.9* 11.4*  HCT 29.1* 31.1* 33.7* 34.3* 35.4*  MCV 99.0 98.4 96.0 96.6 97.0  PLT 371 339 342 356 345   CBG: Recent Labs  Lab 09/04/19 0803 09/04/19 1137 09/04/19 1639 09/04/19 2142 09/05/19 0753  GLUCAP 189* 149* 190* 185* 164*   Studies/Results: Dg Abd 1 View  Result Date: 09/05/2019 CLINICAL DATA:  Intractable vomiting. EXAM: ABDOMEN - 1 VIEW COMPARISON:  08/30/2019. FINDINGS:  Peritoneal dialysis catheter noted with tip over the pelvis. Surgical clips right upper quadrant. No bowel distention or free air. Calcified pelvic densities noted consistent with fibroids. Aortoiliac and peripheral atherosclerotic vascular calcification. Degenerative change lumbar spine and left hip. Right hip replacement. IMPRESSION: One peritoneal dialysis catheter noted with tip over the pelvis. No acute intra-abdominal abnormality identified. No bowel distention or free air. 2.  Calcified pelvic fibroids. 3.  Aortoiliac and peripheral vascular disease. Electronically Signed   By: [place]   On: 09/05/2019 10:38   Medications: . dialysis solution 1.5% low-MG/low-CA     . allopurinol  300 mg Oral QODAY  . Chlorhexidine Gluconate Cloth  6 each Topical Q0600  . feeding supplement (ENSURE ENLIVE)  237 mL Oral QHS  . feeding supplement (NEPRO CARB STEADY)  237 mL Oral BID BM  . gentamicin cream  1 application Topical Daily  . heparin  5,000 Units Subcutaneous Q8H  . multivitamin  1 tablet Oral QHS  . pantoprazole  40 mg Oral BID  . sucralfate  1 g Oral TID WC & HS    Dialysis Orders: CCPD, 7X  Week, EDW 53 (kg) Ca 2.5 (mEq/L) Mg 0.5 (mEq/L) Dextrose 1.5; 2.5; 4.25 %, # Exchanges 5, fill vol 2500 mL, Dwell Time 2 hrs 0 min,   Assessment/Plan: 1. Partial SBO: Resolving, diet advanced. Mild pain this am, but having BMs. 2. 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 Did not feel that systemic treatment was needed at that time. Unclear if that is related to recent events.Endo 09/02/19.LA grade B esophagitis mid and distal esophagus as well as well esophagitis at GE junction, Diffuse gastritis prepyloric stomach. Limit Carafate to 2 weeks d/t ESRD status. 3. ESRD - Continue CCPD at current orders.PD cath was not functional in the setting of partial SBO andfelt best to give her belly a break, S/p Vas Cath andhemodialysis times one on 10/10.PD catheter now  functioning, hadfull treatment and will have vas cath removed and she can continue PD as previously ordered. 4. Hypokalemia: K 3.2 - will give KCl 51mEq today - now on regular diet. 5. Hypertension/volume : BP stable, home meds on hold. 6. Anemia: Hgb 11.4 - will resume ESA after discharge. 7. Metabolic bone disease - Has been on Renvela packets 2.4grams TID AC and sensipar 30 mg daily. CorrCa high initially, improving - follow. 8. Nutrition-Albumin low. Renal diet, add prostat, renal vits.  Veneta Penton, PA-C 09/05/2019, 10:57 AM  [place] Eileen Perez Pager: [phone]

## 2019-09-05 NOTE — Progress Notes (Addendum)
PROGRESS NOTE    Eileen Perez   LNL:[id number]  DOB: 05/23/1941  DOA: 08/28/2019 PCP: Eileen Pinto, MD   Brief Narrative:  Eileen Perez is a 78 year old with end-stage renal disease on peritoneal dialysis, diabetes mellitus, hypertension, hyperlipidemia, gastroesophageal reflux, pancreatic cyst, carcinoid tumor being followed by Dr. Benay Perez with no evidence of metastasis.-She presented to the ED with nausea. 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 GI consulted on 10/9 for thickening noted in gastric area  Subjective: Vomited again yesterday. Still had not had anything to drink today. No pain or indigestion at the moment.   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 Benay Perez to follow-up with Eileen Perez for surveillance EGD -Eileen Perez plan to follow her up 8 months after July's visit -GI was  consulted on 10/9 as mentioned -The patient underwent an EGD 13 which revealed the following: 1 space LA grade B esophagitis in the mid and distal esophagus as well as esophagitis and GE junction 2  diffuse gastritis -Biopsies taken from various areas will need to be followed by GI -GI plans for twice daily PPI and Sucralfate for 2wk and repeat endoscopy in 3 months -Slowly advance diet from current clear liquids - still not tolerating enough liquids and not eating solids at all - for ongoing dehydration>  holding off on IVF as she is a dialysis patient but did ask nephrology to appropriately adjust dialysate - she is not on any narcotics which could be causing her vomiting - I will recheck and abd xray to day to look for SBO  Leukocytosis - no symptoms of infection at this time. She does not urinate, no cough, thrush, diarrhea, or fevers - Follow    ESRD (end stage renal disease)   -Undergoes peritoneal dialysis at home -Appreciate nephrology- follow-up in the hospital -Status post Vas-Cath and hemodialysis 1 time on 10/10 due to problems with peritoneal dialysis -continue CCPD per nephrology    Essential hypertension -Home meds include hydralazine & labetalol -BP not elevated and thus meds on hold  Gout -Resumed allopurinol    Protein-calorie malnutrition, severe - cont dietary supplements  Diabetes mellitus 2-diet controlled Not on any medications at home-last A1c was 6.5 when checked in 4/29-her sugars in the hospital are not very high  Time spent in minutes: 30 DVT prophylaxis: Heparin Code Status: Full code Family Communication:  Disposition Plan: Continue to follow oral intake which is near nothing right now- Home with home health when ready to discharge Consultants:   Nephrology  GI  General surgery Procedures:   EGD 10/13 - LA Grade D esophagitis. - Non-obstructing Schatzki ring. - A tattoo was seen in the gastric antrum (greater curvature). A  post-polypectomy scar was found at the tattoo site. Biopsied. - Gastritis. Biopsied. - Gastritis. Biopsied. - Normal duodenal bulb, first portion of the duodenum and second portion of the duodenum. Antimicrobials:  Anti-infectives (From admission, onward)   None       Objective: Vitals:   09/04/19 2144 09/05/19 0519 09/05/19 0547 09/05/19 0755  BP: 99/62 97/61 97/60  111/64  Pulse: 96 90 87 86  Resp:   18 16  Temp: 97.6 F (36.4 C) 97.7 F (36.5 C) 97.9 F (36.6 C) 97.8 F (36.6 C)  TempSrc: Oral Oral Oral Oral  SpO2: 100% 99% 98% 99%  Weight:    48.4 kg  Height:        Intake/Output Summary (Last 24 hours) at 09/05/2019 0934 Last data filed at 09/04/2019 2100 Gross per 24 hour  Intake 580 ml  Output 400 ml  Net 180 ml   Filed Weights   09/04/19 1128 09/04/19 1741 09/05/19 0755  Weight: 48.3 kg 48.2 kg 48.4 kg    Examination: General exam: Appears comfortable  HEENT: PERRLA, oral mucosa moist, no sclera icterus or thrush Respiratory system: Clear to auscultation. Respiratory effort normal. Cardiovascular system: S1 & S2 heard,  No murmurs  Gastrointestinal system: Abdomen soft, mildly tender, nondistended. BS poor today. Central nervous system: Alert and oriented. No focal neurological deficits. Extremities: No cyanosis, clubbing or edema Skin: No rashes or ulcers Psychiatry:  Mood & affect appropriate.     Data Reviewed: I have personally reviewed following labs and imaging studies  CBC: Recent Labs  Lab 08/30/19 0733 09/01/19 0324 09/02/19 0837 09/03/19 0154 09/04/19 1015  WBC 10.0 11.8* 16.4* 15.3* 17.4*  HGB 9.2* 9.9* 11.1* 10.9* 11.4*  HCT 29.1* 31.1* 33.7* 34.3* 35.4*  MCV 99.0 98.4 96.0 96.6 97.0  PLT 371 339 342 356 956   Basic Metabolic Panel: Recent Labs  Lab 08/30/19 0733 09/01/19 0324 09/02/19 0837 09/03/19 0154 09/05/19 0654  NA 137 138 134* 137 134*  K 3.8 3.6 3.5 3.2* 3.2*  CL 95* 99 92* 95* 92*  CO2 21* 23 24 22  21*   GLUCOSE 140* 100* 222* 180* 183*  BUN 81* 60* 59* 58* 63*  CREATININE 8.96* 8.17* 8.37* 8.12* 8.33*  CALCIUM 10.0 10.2 10.0 9.8 9.8  PHOS 7.7*  --   --   --  5.8*   GFR: Estimated Creatinine Clearance: 3.9 mL/min (A) (by C-G formula based on SCr of 8.33 mg/dL (H)). Liver Function Tests: Recent Labs  Lab 08/30/19 0733 09/05/19 0654  ALBUMIN 2.2* 2.4*   No results for input(s): LIPASE, AMYLASE in the last 168 hours. No results for input(s): AMMONIA in the last 168 hours. Coagulation Profile: No results for input(s): INR, PROTIME in the last 168 hours. Cardiac Enzymes: No results for input(s): CKTOTAL, CKMB, CKMBINDEX, TROPONINI in the last 168 hours. BNP (last 3 results) No results for input(s): PROBNP in the last 8760 hours. HbA1C: No results for input(s): HGBA1C in the last 72 hours. CBG: Recent Labs  Lab 09/04/19 0803 09/04/19 1137 09/04/19 1639 09/04/19 2142 09/05/19 0753  GLUCAP 189* 149* 190* 185* 164*   Lipid Profile: No results for input(s): CHOL, HDL, LDLCALC, TRIG, CHOLHDL, LDLDIRECT in the last 72 hours. Thyroid Function Tests: No results for input(s): TSH, T4TOTAL, FREET4, T3FREE, THYROIDAB in the last 72 hours. Anemia Panel: No  results for input(s): VITAMINB12, FOLATE, FERRITIN, TIBC, IRON, RETICCTPCT in the last 72 hours. Urine analysis:    Component Value Date/Time   COLORURINE YELLOW 03/25/2019 1548   APPEARANCEUR TURBID (A) 03/25/2019 1548   LABSPEC 1.007 03/25/2019 1548   PHURINE 7.0 03/25/2019 1548   GLUCOSEU NEGATIVE 03/25/2019 1548   HGBUR 2+ (A) 03/25/2019 1548   BILIRUBINUR NEGATIVE 12/08/2016 1115   KETONESUR NEGATIVE 03/25/2019 1548   PROTEINUR 2+ (A) 03/25/2019 1548   UROBILINOGEN 0.2 05/19/2015 0942   NITRITE NEGATIVE 03/25/2019 1548   LEUKOCYTESUR 3+ (A) 03/25/2019 1548   Sepsis Labs: @LABRCNTIP (procalcitonin:4,lacticidven:4) ) Recent Results (from the past 240 hour(s))  SARS CORONAVIRUS 2 (TAT 6-24 HRS) Nasopharyngeal  Nasopharyngeal Swab     Status: None   Collection Time: 08/28/19  9:25 PM   Specimen: Nasopharyngeal Swab  Result Value Ref Range Status   SARS Coronavirus 2 NEGATIVE NEGATIVE Final    Comment: (NOTE) SARS-CoV-2 target nucleic acids are NOT DETECTED. The SARS-CoV-2 RNA is generally detectable in upper and lower respiratory specimens during the acute phase of infection. Negative results do not preclude SARS-CoV-2 infection, do not rule out co-infections with other pathogens, and should not be used as the sole basis for treatment or other patient management decisions. Negative results must be combined with clinical observations, patient history, and epidemiological information. The expected result is Negative. Fact Sheet for Patients: SugarRoll.be Fact Sheet for Healthcare Providers: https://www.woods-mathews.com/ This test is not yet approved or cleared by the [place] FDA and  has been authorized for detection and/or diagnosis of SARS-CoV-2 by FDA under an Emergency Use Authorization (EUA). This EUA will remain  in effect (meaning this test can be used) for the duration of the COVID-19 declaration under Section 56 4(b)(1) of the Act, 21 U.S.C. section 360bbb-3(b)(1), unless the authorization is terminated or revoked sooner. Performed at [facility], [place] [address] [place], [place] [address]   MRSA PCR Screening     Status: None   Collection Time: 08/29/19  1:34 AM   Specimen: Nasopharyngeal  Result Value Ref Range Status   MRSA by PCR NEGATIVE NEGATIVE Final    Comment:        The GeneXpert MRSA Assay (FDA approved for NASAL specimens only), is one component of a comprehensive MRSA colonization surveillance program. It is not intended to diagnose MRSA infection nor to guide or monitor treatment for MRSA infections. Performed at [facility], [place] [address] [place], [place] [address]          Radiology  Studies: No results found.    Scheduled Meds: . allopurinol  300 mg Oral QODAY  . Chlorhexidine Gluconate Cloth  6 each Topical Q0600  . feeding supplement (ENSURE ENLIVE)  237 mL Oral QHS  . feeding supplement (NEPRO CARB STEADY)  237 mL Oral BID BM  . gentamicin cream  1 application Topical Daily  . heparin  5,000 Units Subcutaneous Q8H  . multivitamin  1 tablet Oral QHS  . pantoprazole  40 mg Oral BID  . sucralfate  1 g Oral TID WC & HS   Continuous Infusions: . dialysis solution 1.5% low-MG/low-CA       LOS: 8 days      Debbe Odea, MD [facility] Pager: [email or web address] Password [facility] 09/05/2019, 9:34 AM

## 2019-09-05 NOTE — TOC Progression Note (Signed)
Transition of Care [facility]) - Progression Note    Patient Details  Name: Eileen Perez MRN: [id number] Date of Birth: 1941-08-07  Transition of Care [facility]) CM/SW [place], [place] Phone Number: 09/05/2019, 3:17 PM  Clinical Narrative:    CSW spoke with Georgina Snell with [facility]. Pt already has been accepted for RN and Aide services, requested PT as well. This will need to be added to orders when pt ready for discharge,    Expected Discharge Plan: [place] Barriers to Discharge: Continued Medical Work up  Expected Discharge Plan and Services Expected Discharge Plan: [place] Discharge Planning Services: CM Consult Post Acute Care Choice: [place] arrangements for the past 2 months: Single Family Home           DME Arranged: N/A HH Arranged: RN  Social Determinants of Health (SDOH) Interventions    Readmission Risk Interventions No flowsheet data found.

## 2019-09-06 DIAGNOSIS — Z23 Encounter for immunization: Secondary | ICD-10-CM | POA: Diagnosis not present

## 2019-09-06 DIAGNOSIS — K2970 Gastritis, unspecified, without bleeding: Secondary | ICD-10-CM

## 2019-09-06 DIAGNOSIS — R82998 Other abnormal findings in urine: Secondary | ICD-10-CM | POA: Diagnosis not present

## 2019-09-06 DIAGNOSIS — D509 Iron deficiency anemia, unspecified: Secondary | ICD-10-CM | POA: Diagnosis not present

## 2019-09-06 DIAGNOSIS — E876 Hypokalemia: Secondary | ICD-10-CM | POA: Diagnosis not present

## 2019-09-06 DIAGNOSIS — E119 Type 2 diabetes mellitus without complications: Secondary | ICD-10-CM | POA: Diagnosis not present

## 2019-09-06 DIAGNOSIS — D631 Anemia in chronic kidney disease: Secondary | ICD-10-CM | POA: Diagnosis not present

## 2019-09-06 DIAGNOSIS — N2581 Secondary hyperparathyroidism of renal origin: Secondary | ICD-10-CM | POA: Diagnosis not present

## 2019-09-06 DIAGNOSIS — Z4932 Encounter for adequacy testing for peritoneal dialysis: Secondary | ICD-10-CM | POA: Diagnosis not present

## 2019-09-06 DIAGNOSIS — N2589 Other disorders resulting from impaired renal tubular function: Secondary | ICD-10-CM | POA: Diagnosis not present

## 2019-09-06 DIAGNOSIS — N186 End stage renal disease: Secondary | ICD-10-CM | POA: Diagnosis not present

## 2019-09-06 DIAGNOSIS — Z992 Dependence on renal dialysis: Secondary | ICD-10-CM | POA: Diagnosis not present

## 2019-09-06 DIAGNOSIS — R112 Nausea with vomiting, unspecified: Secondary | ICD-10-CM

## 2019-09-06 DIAGNOSIS — K208 Other esophagitis without bleeding: Secondary | ICD-10-CM

## 2019-09-06 HISTORY — DX: Gastritis, unspecified, without bleeding: K29.70

## 2019-09-06 LAB — CBC
HCT: 35.9 % — ABNORMAL LOW (ref 36.0–46.0)
Hemoglobin: 11.6 g/dL — ABNORMAL LOW (ref 12.0–15.0)
MCH: 30.9 pg (ref 26.0–34.0)
MCHC: 32.3 g/dL (ref 30.0–36.0)
MCV: 95.5 fL (ref 80.0–100.0)
Platelets: 364 10*3/uL (ref 150–400)
RBC: 3.76 MIL/uL — ABNORMAL LOW (ref 3.87–5.11)
RDW: 16.5 % — ABNORMAL HIGH (ref 11.5–15.5)
WBC: 10 10*3/uL (ref 4.0–10.5)
nRBC: 0 % (ref 0.0–0.2)

## 2019-09-06 LAB — RENAL FUNCTION PANEL
Albumin: 2.3 g/dL — ABNORMAL LOW (ref 3.5–5.0)
Anion gap: 22 — ABNORMAL HIGH (ref 5–15)
BUN: 70 mg/dL — ABNORMAL HIGH (ref 8–23)
CO2: 20 mmol/L — ABNORMAL LOW (ref 22–32)
Calcium: 10 mg/dL (ref 8.9–10.3)
Chloride: 92 mmol/L — ABNORMAL LOW (ref 98–111)
Creatinine, Ser: 8.56 mg/dL — ABNORMAL HIGH (ref 0.44–1.00)
GFR calc Af Amer: 5 mL/min — ABNORMAL LOW (ref >60)
GFR calc non Af Amer: 4 mL/min — ABNORMAL LOW (ref >60)
Glucose, Bld: 180 mg/dL — ABNORMAL HIGH (ref 70–99)
Phosphorus: 6.9 mg/dL — ABNORMAL HIGH (ref 2.5–4.6)
Potassium: 3.3 mmol/L — ABNORMAL LOW (ref 3.5–5.1)
Sodium: 134 mmol/L — ABNORMAL LOW (ref 135–145)

## 2019-09-06 LAB — GLUCOSE, CAPILLARY
Glucose-Capillary: 179 mg/dL — ABNORMAL HIGH (ref 70–99)
Glucose-Capillary: 147 mg/dL — ABNORMAL HIGH (ref 70–99)
Glucose-Capillary: 146 mg/dL — ABNORMAL HIGH (ref 70–99)

## 2019-09-06 MED ORDER — ENSURE ENLIVE PO LIQD: 237.0000 mL | Freq: Every day | ORAL | 12 refills | Status: DC

## 2019-09-06 MED ORDER — NEPRO/CARBSTEADY PO LIQD: 237.0000 mL | Freq: Two times a day (BID) | ORAL | 12 refills | Status: DC

## 2019-09-06 MED ORDER — SUCRALFATE 1 GM/10ML PO SUSP: 1.0000 g | Freq: Three times a day (TID) | ORAL | 0 refills | Status: DC

## 2019-09-06 NOTE — Progress Notes (Addendum)
[facility] Progress Note   Subjective: Going home today. Assures me that she is eating better. No C/O nausea, no vomiting.   Objective Vitals:   09/05/19 1906 09/05/19 1926 09/05/19 2202 09/06/19 0441  BP:  103/64 (!) 93/47 93/62  Pulse:  90 99 88  Resp:   16 18  Temp:  98.1 F (36.7 C) (!) 97.5 F (36.4 C) (!) 97.5 F (36.4 C)  TempSrc:  Oral Oral Oral  SpO2:  100% 100% 100%  Weight: 47.8 kg   50.9 kg  Height:       Physical Exam General:Pleasant older female in NAD Heart:S1,S2 RRR Lungs:CTAB A/P Abdomen:Soft, NT, active BS.  Extremities:No LE edema Dialysis Access:PD catheter,Drsg CDI.   Additional Objective Labs: Basic Metabolic Panel: Recent Labs  Lab 09/03/19 0154 09/05/19 0654 09/06/19 0324  NA 137 134* 134*  K 3.2* 3.2* 3.3*  CL 95* 92* 92*  CO2 22 21* 20*  GLUCOSE 180* 183* 180*  BUN 58* 63* 70*  CREATININE 8.12* 8.33* 8.56*  CALCIUM 9.8 9.8 10.0  PHOS  --  5.8* 6.9*   Liver Function Tests: Recent Labs  Lab 09/05/19 0654 09/06/19 0324  ALBUMIN 2.4* 2.3*   No results for input(s): LIPASE, AMYLASE in the last 168 hours. CBC: Recent Labs  Lab 09/01/19 0324 09/02/19 0837 09/03/19 0154 09/04/19 1015 09/06/19 0742  WBC 11.8* 16.4* 15.3* 17.4* 10.0  HGB 9.9* 11.1* 10.9* 11.4* 11.6*  HCT 31.1* 33.7* 34.3* 35.4* 35.9*  MCV 98.4 96.0 96.6 97.0 95.5  PLT 339 342 356 345 364   Blood Culture No results found for: SDES, SPECREQUEST, CULT, REPTSTATUS  Cardiac Enzymes: No results for input(s): CKTOTAL, CKMB, CKMBINDEX, TROPONINI in the last 168 hours. CBG: Recent Labs  Lab 09/05/19 1212 09/05/19 1651 09/05/19 2152 09/06/19 0752 09/06/19 1138  GLUCAP 148* 155* 215* 179* 147*   Iron Studies: No results for input(s): IRON, TIBC, TRANSFERRIN, FERRITIN in the last 72 hours. @lablastinr3 @ Studies/Results: Dg Abd 1 View  Result Date: 09/05/2019 CLINICAL DATA:  Intractable vomiting. EXAM: ABDOMEN - 1 VIEW COMPARISON:   08/30/2019. FINDINGS: Peritoneal dialysis catheter noted with tip over the pelvis. Surgical clips right upper quadrant. No bowel distention or free air. Calcified pelvic densities noted consistent with fibroids. Aortoiliac and peripheral atherosclerotic vascular calcification. Degenerative change lumbar spine and left hip. Right hip replacement. IMPRESSION: One peritoneal dialysis catheter noted with tip over the pelvis. No acute intra-abdominal abnormality identified. No bowel distention or free air. 2.  Calcified pelvic fibroids. 3.  Aortoiliac and peripheral vascular disease. Electronically Signed   By: Bracey   On: 09/05/2019 10:38   Medications: . dialysis solution 1.5% low-MG/low-CA     . allopurinol  300 mg Oral QODAY  . Chlorhexidine Gluconate Cloth  6 each Topical Q0600  . feeding supplement (ENSURE ENLIVE)  237 mL Oral QHS  . feeding supplement (NEPRO CARB STEADY)  237 mL Oral BID BM  . gentamicin cream  1 application Topical Daily  . heparin  5,000 Units Subcutaneous Q8H  . multivitamin  1 tablet Oral QHS  . pantoprazole  40 mg Oral BID  . sucralfate  1 g Oral TID WC & HS    Dialysis Orders: CCPD, 7X Week, EDW 53 (kg) Ca 2.5 (mEq/L) Mg 0.5 (mEq/L) Dextrose 1.5; 2.5; 4.25 %, # Exchanges 5, fill vol 2500 mL, Dwell Time 2 hrs 0 min,   Assessment/Plan: 1. Partial SBO: Resolving, diet advanced. Mild pain this am, but having BMs. 2.  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 Did not feel that systemic treatment was needed at that time. Unclear if that is related to recent events.Endo 09/02/19.LA grade B esophagitis mid and distal esophagus as well as well esophagitis at GE junction, Diffuse gastritis prepyloric stomach. Limit Carafate to 2 weeks d/t ESRD status. 3. ESRD - Continue CCPD at current orders.PD cath was not functional in the setting of partial SBO andfelt best to give her belly a break, S/p Vas Cath andhemodialysis times one on  10/10.PD catheter now functioning, hadfull treatment and will have vas cath removed and she can continue PD as previously ordered. 4. Hypokalemia: K 3.3 - now on regular diet. 5. Hypertension/volume : BP stable, home meds on hold. EDW has trended 47.8-48.4 range. Wt 48.1 kg today.  6. Anemia: Hgb 11.4 - will resume ESA after discharge. 7. Metabolic bone disease - Has been on Renvela packets 2.4grams TID AC and sensipar 30 mg daily. CorrCa high initially, improving - follow. 8. Nutrition-Albumin low. Regular diet, add prostat, renal vits.  Rita H. Brown NP-C 09/06/2019, 12:07 PM  [facility] [phone]  Pt seen, examined and agree w A/P as above.  Kelly Splinter  MD 09/06/2019, 1:30 PM

## 2019-09-06 NOTE — Progress Notes (Deleted)
Discharged patient at this time via wheelchlair

## 2019-09-06 NOTE — Progress Notes (Signed)
Discharged home today. Daughter,Denise is driving patient home and will be here around 7pm. Discharged instructions given to patient and daughter via phone. Advised to pick up medications called in to pharmacy of choice. Verbalized understanding of instructions.

## 2019-09-06 NOTE — Discharge Summary (Signed)
Physician Discharge Summary  Trenell Moxey OHF:[id number] DOB: 05-Nov-1941 DOA: 08/28/2019  PCP: Unk Pinto, MD  Admit date: 08/28/2019 Discharge date: 09/06/2019  Admitted From: home Disposition:  home   Recommendations for Outpatient Follow-up:  1. Needs endoscopy repeated by EG in 3 mo 2. BP medications need to be resumed if/ when BP  Home Health:  ordered  Discharge Condition:  stable   CODE STATUS:  Full code   Consultations:  GI    Discharge Diagnoses:  Principal Problem:   Small bowel obstruction ([place]) Active Problems:   Gastritis- Esophagitis, [place] grade B   Essential hypertension   DM2- diet controlled   ESRD (end stage renal disease) (HCC)   H/o Carcinoid tumor of stomach   Protein-calorie malnutrition, severe        Brief Summary: Eileen Perez is a 78 year old with end-stage renal disease on peritoneal dialysis, diabetes mellitus, hypertension, hyperlipidemia, gastroesophageal reflux, pancreatic cyst, carcinoid tumor being followed by Dr. Benay Spice with no evidence of metastasis.-She presented to the ED with nausea. 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 GI consulted on 10/9 for thickening noted in gastric area.  Hospital Course:  Principal Problem:   Small bowel obstruction  -Partial small bowel obstruction on admission treated conservatively with bowel  rest -Resolved spontaneously- started tolerating clears by 10/12  Active Problems:     Carcinoid tumor of stomach - intractable vomiting and abdominal pain -  increased thickening of gastric antrum and pyloric areas noted on CT now with 1 month of nausea -Previous carcinoid tumor involved a resected gastric polyp was no evidence of metastatic disease and no indication for systemic therapy-she was recommended by Dr. Benay Spice to follow-up with Dr.Brahmbatt for surveillance EGD -GI was consulted on 10/9 as mentioned above -The patient underwent an EGD on 10/13 which revealed the following: 1 space LA grade B esophagitis in the mid and distal esophagus as well as esophagitis and GE junction 2  diffuse gastritis -Biopsies taken from various areas will need to be followed by GI -GI plans for twice daily PPI and Sucralfate for 2wk total and repeat endoscopy in 3 months - yesterday she was able to drink liquids, eat oatmeal, bacon and a salad  - she is stable to d/c home- given a prescription of Zofran as she may continue to have some nausea and vomiting at home   Leukocytosis - see lab trend bleow - no symptoms of infection at this time. She does not urinate, no cough, thrush, diarrhea, or fevers -has resolved and is 9 today    ESRD (end stage renal disease)   -Undergoes peritoneal dialysis at home -Appreciate nephrology- follow-up in the hospital -Status post Vas-Cath and hemodialysis 1 time on 10/10 due to problems with peritoneal dialysis -continue CCPD per nephrology   Hypotension with h/o Essential hypertension -Home meds include hydralazine & labetalol have been on hold as BP has not been elevated and remains low  Gout -Resumed allopurinol    Protein-calorie malnutrition, severe -  cont dietary supplements  Diabetes mellitus 2-diet controlled Not on any medications at home-last A1c was 6.5 when checked in 4/29-her sugars in the hospital are not very high    Discharge  Exam: Vitals:   09/05/19 2202 09/06/19 0441  BP: (!) 93/47 93/62  Pulse: 99 88  Resp: 16 18  Temp: (!) 97.5 F (36.4 C) (!) 97.5 F (36.4 C)  SpO2: 100% 100%   Vitals:   09/05/19 1906 09/05/19 1926 09/05/19 2202 09/06/19 0441  BP:  103/64 (!) 93/47 93/62  Pulse:  90 99 88  Resp:   16 18  Temp:  98.1 F (36.7 C) (!) 97.5 F (36.4 C) (!) 97.5 F (36.4 C)  TempSrc:  Oral Oral Oral  SpO2:  100% 100% 100%  Weight: 47.8 kg   50.9 kg  Height:        General: Pt is alert, awake, not in acute distress Cardiovascular: RRR, S1/S2 +, no rubs, no gallops Respiratory: CTA bilaterally, no wheezing, no rhonchi Abdominal: Soft, NT, ND, bowel sounds + Extremities: no edema, no cyanosis   Discharge Instructions  Discharge Instructions    Diet general   Complete by: As directed    Bland renal diet   Increase activity slowly   Complete by: As directed      Allergies as of 09/06/2019      Reactions   Ace Inhibitors Other (See Comments)   unknown   Nsaids Other (See Comments)   unknown      Medication List    STOP taking these medications   hydrALAZINE 10 MG tablet Commonly known as: APRESOLINE   labetalol 200 MG tablet Commonly known as: NORMODYNE     TAKE these medications   allopurinol 300 MG tablet Commonly known as: ZYLOPRIM TAKE 1/2 TO 1 TABLET BY  MOUTH DAILY AS DIRECTED What changed: See the new instructions.   aspirin 81 MG tablet Take 81 mg by mouth at bedtime.   b complex-vitamin c-folic acid 0.8 MG Tabs tablet Take 1 tablet by mouth at bedtime.   cinacalcet 30 MG tablet Commonly known as: SENSIPAR Take 30 mg by mouth daily.   feeding supplement (ENSURE ENLIVE) Liqd Take 237 mLs by mouth at bedtime.   feeding supplement (NEPRO CARB STEADY) Liqd Take 237 mLs by mouth 2 (two) times daily between meals.   Fish Oil 1200 MG Caps Take 1,200 mg by mouth daily.   Flaxseed Oil 1000 MG Caps Take 1 capsule by mouth daily.   pantoprazole 40 MG  tablet Commonly known as: Protonix Take 1 tablet (40 mg total) by mouth 2 (two) times daily.   rosuvastatin 5 MG tablet Commonly known as: CRESTOR Take 1 tablet (5 mg total) by mouth daily after supper.   sevelamer carbonate 800 MG tablet Commonly known as: RENVELA Take 800 mg by mouth 3 (three) times daily with meals.   sucralfate 1 GM/10ML suspension Commonly known as: CARAFATE Take 10 mLs (1 g total) by mouth 4 (four) times daily -  with meals and at bedtime for 10 days.   ZINC GLUCONATE PO Take 40 mg by mouth daily.      Follow-up Information    [facility] Follow up.   Specialty: Home Health Services Contact information: [place] [address] [phone]        Otis Brace, MD. Schedule an appointment as soon as possible for a visit in 4 week(s).   Specialty: Gastroenterology Why: Gastric carcinoid, esophagitis  Contact information: [place] [address] [phone]        Unk Pinto, MD Follow up.   Specialty: Internal Medicine Why: 1-2 wks Contact information: [address] [place] [id number] [phone]          Allergies  Allergen Reactions  . Ace Inhibitors Other (See Comments)    unknown  . Nsaids Other (See Comments)    unknown     Procedures/Studies: EGD  Ct Abdomen Pelvis Wo Contrast  Result Date: 08/28/2019 CLINICAL DATA:  Abdominal distension, pain, nausea and vomiting. Peritoneal dialysis. 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 EXAM: CT ABDOMEN AND PELVIS WITHOUT CONTRAST TECHNIQUE: Multidetector CT imaging of the abdomen and pelvis was performed following the standard protocol without IV contrast. COMPARISON:  05/20/2019 FINDINGS: Lower chest: The previously demonstrated 4 mm right middle lobe solid lung nodule measures 3 mm in maximum diameter on image number 10  series 5. Hepatobiliary: No focal liver abnormality is seen. Status post cholecystectomy. No biliary dilatation. Pancreas: Stable marked atrophy of the tail and distal body of the pancreas. Spleen: Normal in size without focal abnormality. Adrenals/Urinary Tract: No significant change in bilateral adrenal hyperplasia. No significant change in multiple simple and complicated renal cysts in both kidneys. Poorly distended urinary bladder, obscured by streak artifacts from a right hip prosthesis. No visible ureteral abnormalities. Stomach/Bowel: Interval multiple moderately dilated loops of proximal small bowel with normal caliber or distal small bowel loops. The exact point of transition is difficult to determine. No obstructing mass visualized. Diffuse wall thickening in the antrum and pyloric regions of the stomach. This appears thicker and more heterogeneous posteriorly, measuring 2.2 cm in thickness. Multiple sigmoid colon diverticula. Vascular/Lymphatic: A previously demonstrated 10 mm short axis gastrohepatic ligament lymph node has a corresponding diameter of 8 mm on image number 14 series 3. No new or enlarging lymph nodes. Atheromatous arterial calcifications without aneurysm. Reproductive: Enlarged uterus containing multiple bulky, densely calcified fibroids. Other: The previously noted paraumbilical hernia containing fat has not changed significantly with other than some edema in the fat today. No air in the hernia today. The peritoneal dialysis catheter tip is in the inferior right pelvis, not well visualized due to the streak artifacts from the right hip prosthesis. Musculoskeletal: Right hip prosthesis with associated streak artifacts. Moderate left hip degenerative changes. Lumbar and lower thoracic spine degenerative changes and mild scoliosis. No evidence of bony metastatic disease. IMPRESSION: 1. Interval partial small bowel obstruction. The exact point of transition is difficult to determine. This is  most likely due to an adhesion. 2. 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 Correlation with the exact site of tumor at the time of endoscopic resection would be helpful. 3. Stable mildly enlarged gastrohepatic ligament lymph node suspicious for a possible metastatic node. 4. Sigmoid diverticulosis. 5. Stable paraumbilical hernia containing fat with other than some edema in the fat today. 6. Stable 4 mm right middle lobe solid lung nodule. 7. Stable bilateral simple and complicated renal cysts. 8. Stable uterine fibroids. 9. Stable marked atrophy of the tail and distal body of the pancreas. Electronically Signed   By: Claudie Revering M.D.   On: 08/28/2019 21:08   Dg Abd 1 View  Result Date: 09/05/2019 CLINICAL DATA:  Intractable vomiting. EXAM: ABDOMEN - 1 VIEW COMPARISON:  08/30/2019. FINDINGS: Peritoneal dialysis catheter noted with  tip over the pelvis. Surgical clips right upper quadrant. No bowel distention or free air. Calcified pelvic densities noted consistent with fibroids. Aortoiliac and peripheral atherosclerotic vascular calcification. Degenerative change lumbar spine and left hip. Right hip replacement. IMPRESSION: One peritoneal dialysis catheter noted with tip over the pelvis. No acute intra-abdominal abnormality identified. No bowel distention or free air. 2.  Calcified pelvic fibroids. 3.  Aortoiliac and peripheral vascular disease. Electronically Signed   By: [place]   On: 09/05/2019 10:38   Dg Abd 1 View  Result Date: 08/29/2019 CLINICAL DATA:  Small bowel obstruction. EXAM: ABDOMEN - 1 VIEW COMPARISON:  CT scan of August 28, 2019. Radiograph of February 14, 2017. FINDINGS: Mildly dilated small bowel loops are noted concerning for distal small bowel obstruction. No colonic dilatation is noted. Status post cholecystectomy. Dialysis catheter is seen in the pelvis. Large calcified  uterine fibroid is noted. IMPRESSION: Mildly dilated small bowel loops are noted concerning for distal small bowel obstruction. Electronically Signed   By: Marijo Conception M.D.   On: 08/29/2019 09:31   Ir Fluoro Guide Cv Line Right  Result Date: 08/29/2019 INDICATION: 78 year old female with a history of renal dysfunction EXAM: IMAGE GUIDED PLACEMENT OF TEMPORARY hemodialysis catheter MEDICATIONS: None ANESTHESIA/SEDATION: None FLUOROSCOPY TIME:  Fluoroscopy Time: 0 minutes 6 seconds (1 mGy). COMPLICATIONS: None PROCEDURE: Informed written consent was obtained from the patient's family after a discussion of the risks, benefits, and alternatives to treatment. Questions regarding the procedure were encouraged and answered. The right neck was prepped with chlorhexidine in a sterile fashion, and a sterile drape was applied covering the operative field. Maximum barrier sterile technique with sterile gowns and gloves were used for the procedure. A timeout was performed prior to the initiation of the procedure. A micropuncture kit was utilized to access the right internal jugular vein under direct, real-time ultrasound guidance after the overlying soft tissues were anesthetized with 1% lidocaine with epinephrine. Ultrasound image documentation was performed. The microwire was kinked to measure appropriate catheter length. A stiff glidewire was advanced to the level of the IVC. A 16 cm hemodialysis catheter was then placed over the wire. Final catheter positioning was confirmed and documented with a spot radiographic image. The catheter aspirates and flushes normally. The catheter was flushed with appropriate volume heparin dwells. Dressings were applied. The patient tolerated the procedure well without immediate post procedural complication. IMPRESSION: Status post right IJ placement of temporary hemodialysis catheter. Signed, Dulcy Fanny. Dellia Nims, RPVI [facility]  Radiology Electronically Signed   By: Corrie Mckusick D.O.   On: 08/29/2019 15:04   Ir US Guide Vasc Access Right  Result Date: 08/29/2019 INDICATION: 78 year old female with a history of renal dysfunction EXAM: IMAGE GUIDED PLACEMENT OF TEMPORARY hemodialysis catheter MEDICATIONS: None ANESTHESIA/SEDATION: None FLUOROSCOPY TIME:  Fluoroscopy Time: 0 minutes 6 seconds (1 mGy). COMPLICATIONS: None PROCEDURE: Informed written consent was obtained from the patient's family after a discussion of the risks, benefits, and alternatives to treatment. Questions regarding the procedure were encouraged and answered. The right neck was prepped with chlorhexidine in a sterile fashion, and a sterile drape was applied covering the operative field. Maximum barrier sterile technique with sterile gowns and gloves were used for the procedure. A timeout was performed prior to the initiation of the procedure. A micropuncture kit was utilized to access the right internal jugular vein under direct, real-time ultrasound guidance after the overlying soft tissues were anesthetized with 1% lidocaine with epinephrine. Ultrasound  image documentation was performed. The microwire was kinked to measure appropriate catheter length. A stiff glidewire was advanced to the level of the IVC. A 16 cm hemodialysis catheter was then placed over the wire. Final catheter positioning was confirmed and documented with a spot radiographic image. The catheter aspirates and flushes normally. The catheter was flushed with appropriate volume heparin dwells. Dressings were applied. The patient tolerated the procedure well without immediate post procedural complication. IMPRESSION: Status post right IJ placement of temporary hemodialysis catheter. Signed, Dulcy Fanny. Dellia Nims, RPVI [facility] Radiology Electronically Signed   By: Corrie Mckusick D.O.   On: 08/29/2019 15:04   Dg Chest Port 1 View  Result Date:  09/02/2019 CLINICAL DATA:  Leukocytosis. EXAM: PORTABLE CHEST 1 VIEW COMPARISON:  January 04, 2019. FINDINGS: The heart size and mediastinal contours are within normal limits. Both lungs are clear. No pneumothorax or pleural effusion is noted. Right internal jugular catheter is noted with tip in expected position of cavoatrial junction. The visualized skeletal structures are unremarkable. IMPRESSION: Right internal jugular catheter is noted with tip in expected position of cavoatrial junction. No acute cardiopulmonary abnormality seen. Electronically Signed   By: Marijo Conception M.D.   On: 09/02/2019 12:16   Dg Abd Portable 1v  Result Date: 08/30/2019 CLINICAL DATA:  small bowel obstruction Update status EXAM: PORTABLE ABDOMEN - 1 VIEW COMPARISON:  Radiograph 08/29/2019 FINDINGS: No peritoneal dialysis catheter coils in the pelvis. No dilated loops of large or small bowel. Calcified leiomyoma noted. Postcholecystectomy. IMPRESSION: No evidence of bowel obstruction. Electronically Signed   By: Suzy Bouchard M.D.   On: 08/30/2019 10:07     The results of significant diagnostics from this hospitalization (including imaging, microbiology, ancillary and laboratory) are listed below for reference.     Microbiology: Recent Results (from the past 240 hour(s))  SARS CORONAVIRUS 2 (TAT 6-24 HRS) Nasopharyngeal Nasopharyngeal Swab     Status: None   Collection Time: 08/28/19  9:25 PM   Specimen: Nasopharyngeal Swab  Result Value Ref Range Status   SARS Coronavirus 2 NEGATIVE NEGATIVE Final    Comment: (NOTE) SARS-CoV-2 target nucleic acids are NOT DETECTED. The SARS-CoV-2 RNA is generally detectable in upper and lower respiratory specimens during the acute phase of infection. Negative results do not preclude SARS-CoV-2 infection, do not rule out co-infections with other pathogens, and should not be used as the sole basis for treatment or other patient management decisions. Negative results must be  combined with clinical observations, patient history, and epidemiological information. The expected result is Negative. Fact Sheet for Patients: SugarRoll.be Fact Sheet for Healthcare Providers: https://www.woods-mathews.com/ This test is not yet approved or cleared by the [place] FDA and  has been authorized for detection and/or diagnosis of SARS-CoV-2 by FDA under an Emergency Use Authorization (EUA). This EUA will remain  in effect (meaning this test can be used) for the duration of the COVID-19 declaration under Section 56 4(b)(1) of the Act, 21 U.S.C. section 360bbb-3(b)(1), unless the authorization is terminated or revoked sooner. Performed at [facility], [place] [address], [place], [place] [address]   MRSA PCR Screening     Status: None   Collection Time: 08/29/19  1:34 AM   Specimen: Nasopharyngeal  Result Value Ref Range Status   MRSA by PCR NEGATIVE NEGATIVE Final    Comment:        The GeneXpert MRSA Assay (FDA approved for NASAL specimens only), is one component of  a comprehensive MRSA colonization surveillance program. It is not intended to diagnose MRSA infection nor to guide or monitor treatment for MRSA infections. Performed at [facility], [place] [address] [place], [place] [address]      Labs: BNP (last 3 results) Recent Labs    08/28/19 1930  BNP 76.2   Basic Metabolic Panel: Recent Labs  Lab 09/01/19 0324 09/02/19 0837 09/03/19 0154 09/05/19 0654 09/06/19 0324  NA 138 134* 137 134* 134*  K 3.6 3.5 3.2* 3.2* 3.3*  CL 99 92* 95* 92* 92*  CO2 23 24 22  21* 20*  GLUCOSE 100* 222* 180* 183* 180*  BUN 60* 59* 58* 63* 70*  CREATININE 8.17* 8.37* 8.12* 8.33* 8.56*  CALCIUM 10.2 10.0 9.8 9.8 10.0  PHOS  --   --   --  5.8* 6.9*   Liver Function Tests: Recent Labs  Lab 09/05/19 0654 09/06/19 0324  ALBUMIN 2.4* 2.3*   No results for input(s): LIPASE, AMYLASE in the last 168 hours. No  results for input(s): AMMONIA in the last 168 hours. CBC: Recent Labs  Lab 09/01/19 0324 09/02/19 0837 09/03/19 0154 09/04/19 1015 09/06/19 0742  WBC 11.8* 16.4* 15.3* 17.4* 10.0  HGB 9.9* 11.1* 10.9* 11.4* 11.6*  HCT 31.1* 33.7* 34.3* 35.4* 35.9*  MCV 98.4 96.0 96.6 97.0 95.5  PLT 339 342 356 345 364   Cardiac Enzymes: No results for input(s): CKTOTAL, CKMB, CKMBINDEX, TROPONINI in the last 168 hours. BNP: Invalid input(s): POCBNP CBG: Recent Labs  Lab 09/05/19 0753 09/05/19 1212 09/05/19 1651 09/05/19 2152 09/06/19 0752  GLUCAP 164* 148* 155* 215* 179*   D-Dimer No results for input(s): DDIMER in the last 72 hours. Hgb A1c No results for input(s): HGBA1C in the last 72 hours. Lipid Profile No results for input(s): CHOL, HDL, LDLCALC, TRIG, CHOLHDL, LDLDIRECT in the last 72 hours. Thyroid function studies No results for input(s): TSH, T4TOTAL, T3FREE, THYROIDAB in the last 72 hours.  Invalid input(s): FREET3 Anemia work up No results for input(s): VITAMINB12, FOLATE, FERRITIN, TIBC, IRON, RETICCTPCT in the last 72 hours. Urinalysis    Component Value Date/Time   COLORURINE YELLOW 03/25/2019 1548   APPEARANCEUR TURBID (A) 03/25/2019 1548   LABSPEC 1.007 03/25/2019 1548   PHURINE 7.0 03/25/2019 1548   GLUCOSEU NEGATIVE 03/25/2019 1548   HGBUR 2+ (A) 03/25/2019 1548   BILIRUBINUR NEGATIVE 12/08/2016 1115   KETONESUR NEGATIVE 03/25/2019 1548   PROTEINUR 2+ (A) 03/25/2019 1548   UROBILINOGEN 0.2 05/19/2015 0942   NITRITE NEGATIVE 03/25/2019 1548   LEUKOCYTESUR 3+ (A) 03/25/2019 1548   Sepsis Labs Invalid input(s): PROCALCITONIN,  WBC,  LACTICIDVEN Microbiology Recent Results (from the past 240 hour(s))  SARS CORONAVIRUS 2 (TAT 6-24 HRS) Nasopharyngeal Nasopharyngeal Swab     Status: None   Collection Time: 08/28/19  9:25 PM   Specimen: Nasopharyngeal Swab  Result Value Ref Range Status   SARS Coronavirus 2 NEGATIVE NEGATIVE Final    Comment:  (NOTE) SARS-CoV-2 target nucleic acids are NOT DETECTED. The SARS-CoV-2 RNA is generally detectable in upper and lower respiratory specimens during the acute phase of infection. Negative results do not preclude SARS-CoV-2 infection, do not rule out co-infections with other pathogens, and should not be used as the sole basis for treatment or other patient management decisions. Negative results must be combined with clinical observations, patient history, and epidemiological information. The expected result is Negative. Fact Sheet for Patients: SugarRoll.be Fact Sheet for Healthcare Providers: https://www.woods-mathews.com/ This test is not yet approved  or cleared by the [place] and  has been authorized for detection and/or diagnosis of SARS-CoV-2 by FDA under an Emergency Use Authorization (EUA). This EUA will remain  in effect (meaning this test can be used) for the duration of the COVID-19 declaration under Section 56 4(b)(1) of the Act, 21 U.S.C. section 360bbb-3(b)(1), unless the authorization is terminated or revoked sooner. Performed at [facility], [place] [address], [place], [place] [address]   MRSA PCR Screening     Status: None   Collection Time: 08/29/19  1:34 AM   Specimen: Nasopharyngeal  Result Value Ref Range Status   MRSA by PCR NEGATIVE NEGATIVE Final    Comment:        The GeneXpert MRSA Assay (FDA approved for NASAL specimens only), is one component of a comprehensive MRSA colonization surveillance program. It is not intended to diagnose MRSA infection nor to guide or monitor treatment for MRSA infections. Performed at [facility], [place] [address], [place], [place] [address]      Time coordinating discharge in minutes: 65  SIGNED:   Debbe Odea, MD  [facility] 09/06/2019, 11:18 AM Pager   If 7PM-7AM, please contact night-coverage [email or web address] Password [email or web address]

## 2019-09-06 NOTE — Progress Notes (Signed)
Patient's daughter in, discharged via wheelchair

## 2019-09-06 NOTE — TOC Transition Note (Signed)
Transition of Care [facility]) - CM/SW Discharge Note   Patient Details  Name: Eileen Perez MRN: [id number] Date of Birth: 02-02-1941  Transition of Care [facility]) CM/SW Contact:  Zenon Mayo, RN Phone Number: 09/06/2019, 4:26 PM   Clinical Narrative:    Patient for dc today, NCM confirmed with Tommi Rumps with [facility] orders are in for [facility] [place].    Final next level of care: [place] Barriers to Discharge: No Barriers Identified   Patient Goals and CMS Choice Patient states their goals for this hospitalization and ongoing recovery are:: to go home CMS Medicare.gov Compare Post Acute Care list provided to:: Patient Choice offered to / list presented to : Patient  Discharge Placement                       Discharge Plan and Services   Discharge Planning Services: CM Consult Post Acute Care Choice: Home Health          DME Arranged: (NA)         HH Arranged: RN, PT [place] Agency: [facility]     Representative spoke with at [place]: cory  Social Determinants of Health ([place]) Interventions     Readmission Risk Interventions No flowsheet data found.

## 2019-09-06 NOTE — Progress Notes (Signed)
Occupational Therapy Treatment Patient Details Name: Eileen Perez MRN: [id number] DOB: 1941-07-10 Today's Date: 09/06/2019    History of present illness Pt is 78 y.o. female presenting with small bowel obstruction. PMH including thyroid disease, pancreatic cyst, hypertension, hyperlipidemia, GERD, diabetes mellitus, ESRD.   OT comments  Education provided in session. Practiced simulated tub transfer.   Follow Up Recommendations  No OT follow up    Equipment Recommendations  3 in 1 bedside commode    Recommendations for Other Services      Precautions / Restrictions Precautions Precautions: Fall Restrictions Weight Bearing Restrictions: No       Mobility Bed Mobility Overal bed mobility: Needs Assistance Bed Mobility: Sit to Supine;Supine to Sit     Supine to sit: Modified independent (Device/Increase time) Sit to supine: Supervision      Transfers Overall transfer level: Needs assistance Equipment used: Rolling walker (2 wheeled) Transfers: Sit to/from Stand Sit to Stand: Min guard         General transfer comment: cues for hand placement    Balance                                           ADL either performed or assessed with clinical judgement   ADL Overall ADL's : Needs assistance/impaired     Grooming: Oral care;Set up;Supervision/safety;Standing   Upper Body Bathing: Set up;Supervision/ safety;Sitting Upper Body Bathing Details (indicate cue type and reason): washed pt's back for her Lower Body Bathing: Set up;Supervison/ safety;Sit to/from stand   Upper Body Dressing : Minimal assistance;Sitting   Lower Body Dressing: Sit to/from stand;Moderate assistance Lower Body Dressing Details (indicate cue type and reason): donned underwear over both feet Toilet Transfer: Min guard;Ambulation;RW;Comfort height toilet   Toileting- Clothing Manipulation and Hygiene: Sit to/from stand;Minimal assistance Toileting - Clothing  Manipulation Details (indicate cue type and reason): assisted in tearing off her depends Tub/ Banker: Min guard;Ambulation;Rolling walker;Tub transfer   Functional mobility during ADLs: Min guard General ADL Comments: Educated on safety tips for home. Educated on tub transfer techniques and options for shower chair. Practiced simulated tub transfer and difficulty clearing simulated tub.      Vision       Perception     Praxis      Cognition Arousal/Alertness: Awake/alert Behavior During Therapy: WFL for tasks assessed/performed Overall Cognitive Status: Within Functional Limits for tasks assessed                                          Exercises     Shoulder Instructions       General Comments      Pertinent Vitals/ Pain       Pain Assessment: 0-10 Pain Score: (4-5) Pain Location: buttocks Pain Descriptors / Indicators: Sore Pain Intervention(s): Monitored during session  Home Living                                          Prior Functioning/Environment              Frequency  Min 3X/week        Progress Toward Goals  OT Goals(current goals can now be found in the  care plan section)  Progress towards OT goals: Progressing toward goals  Acute Rehab OT Goals Patient Stated Goal: go home OT Goal Formulation: With patient Time For Goal Achievement: 09/13/19 Potential to Achieve Goals: Good ADL Goals Pt Will Perform Grooming: with modified independence;standing Pt Will Perform Lower Body Dressing: with modified independence;sit to/from stand Pt Will Transfer to Toilet: with modified independence;ambulating;bedside commode Pt Will Perform Toileting - Clothing Manipulation and hygiene: with modified independence;sit to/from stand Pt Will Perform Tub/Shower Transfer: Tub transfer;ambulating;shower seat;rolling walker Pt/caregiver will Perform Home Exercise Program: Right Upper extremity;Increased  strength;Increased ROM;With theraputty  Plan Discharge plan remains appropriate    Co-evaluation                 AM-PAC OT "6 Clicks" Daily Activity     Outcome Measure   Help from another person eating meals?: None Help from another person taking care of personal grooming?: A Little Help from another person toileting, which includes using toliet, bedpan, or urinal?: A Little Help from another person bathing (including washing, rinsing, drying)?: A Little Help from another person to put on and taking off regular upper body clothing?: A Little Help from another person to put on and taking off regular lower body clothing?: A Lot 6 Click Score: 18    End of Session Equipment Utilized During Treatment: Gait belt;Rolling walker  OT Visit Diagnosis: Muscle weakness (generalized) (M62.81)   Activity Tolerance Patient tolerated treatment well   Patient Left in bed;with bed alarm set;with nursing/sitter in room   Nurse Communication          Time: 8329-1916 OT Time Calculation (min): 34 min  Charges: OT General Charges $OT Visit: 1 Visit OT Treatments $Self Care/Home Management : 23-37 mins  [place] Yeagley L Karion Cudd OTR/L 09/06/2019, 12:27 PM

## 2019-09-06 NOTE — Discharge Instructions (Signed)
Take a bland diabetic and renal appropriate diet for the next 3-4 weeks. Make sure to keep yourself hydrated. Your BP is too low to resume your BP medications. You can check you BP at home and call you PCP if it is running > 120/80 or you can follow up with your PCP in 1 week and have it checked there.    Bland Diet A bland diet consists of foods that are often soft and do not have a lot of fat, fiber, or extra seasonings. Foods without fat, fiber, or seasoning are easier for the body to digest. They are also less likely to irritate your mouth, throat, stomach, and other parts of your digestive system. A bland diet is sometimes called a BRAT diet. What is my plan? Your health care provider or food and nutrition specialist (dietitian) may recommend specific changes to your diet to prevent symptoms or to treat your symptoms. These changes may include:  Eating small meals often.  Cooking food until it is soft enough to chew easily.  Chewing your food well.  Drinking fluids slowly.  Not eating foods that are very spicy, sour, or fatty.  Not eating citrus fruits, such as oranges and grapefruit. What do I need to know about this diet?  Eat a variety of foods from the bland diet food list.  Do not follow a bland diet longer than needed.  Ask your health care provider whether you should take vitamins or supplements. What foods can I eat? Grains  Hot cereals, such as cream of wheat. Rice. Bread, crackers, or tortillas made from refined white flour. Vegetables Canned or cooked vegetables. Mashed or boiled potatoes. Fruits  Bananas. Applesauce. Other types of cooked or canned fruit with the skin and seeds removed, such as canned peaches or pears. Meats and other proteins  Scrambled eggs. Creamy peanut butter or other nut butters. Lean, well-cooked meats, such as chicken or fish. Tofu. Soups or broths. Dairy Low-fat dairy products, such as milk, cottage cheese, or  yogurt. Beverages  Water. Herbal tea. Apple juice. Fats and oils Mild salad dressings. Canola or olive oil. Sweets and desserts Pudding. Custard. Fruit gelatin. Ice cream. The items listed above may not be a complete list of recommended foods and beverages. Contact a dietitian for more options. What foods are not recommended? Grains Whole grain breads and cereals. Vegetables Raw vegetables. Fruits Raw fruits, especially citrus, berries, or dried fruits. Dairy Whole fat dairy foods. Beverages Caffeinated drinks. Alcohol. Seasonings and condiments Strongly flavored seasonings or condiments. Hot sauce. Salsa. Other foods Spicy foods. Fried foods. Sour foods, such as pickled or fermented foods. Foods with high sugar content. Foods high in fiber. The items listed above may not be a complete list of foods and beverages to avoid. Contact a dietitian for more information. Summary  A bland diet consists of foods that are often soft and do not have a lot of fat, fiber, or extra seasonings.  Foods without fat, fiber, or seasoning are easier for the body to digest.  Check with your health care provider to see how long you should follow this diet plan. It is not meant to be followed for long periods. This information is not intended to replace advice given to you by your health care provider. Make sure you discuss any questions you have with your health care provider. Document Released: 02/28/2016 Document Revised: 12/05/2017 Document Reviewed: 12/05/2017 Elsevier Patient Education  2020 [place] were cared for by a hospitalist during  your hospital stay. If you have any questions about your discharge medications or the care you received while you were in the hospital after you are discharged, you can call the unit and asked to speak with the hospitalist on call if the hospitalist that took care of you is not available. Once you are discharged, your primary care physician will handle  any further medical issues.   Please note that NO REFILLS for any discharge medications will be authorized once you are discharged, as it is imperative that you return to your primary care physician (or establish a relationship with a primary care physician if you do not have one) for your aftercare needs so that they can reassess your need for medications and monitor your lab values.  Please take all your medications with you for your next visit with your Primary MD. Please ask your Primary MD to get all Hospital records sent to his/her office. Please request your Primary MD to go over all hospital test results at the follow up.   If you experience worsening of your admission symptoms, develop shortness of breath, chest pain, suicidal or homicidal thoughts or a life threatening emergency, you must seek medical attention immediately by calling 911 or calling your MD.   Dennis Bast must read the complete instructions/literature along with all the possible adverse reactions/side effects for all the medicines you take including new medications that have been prescribed to you. Take new medicines after you have completely understood and accpet all the possible adverse reactions/side effects.    Do not drive when taking pain medications or sedatives.     Do not take more than prescribed Pain, Sleep and Anxiety Medications   If you have smoked or chewed Tobacco in the last 2 yrs please stop. Stop any regular alcohol  and or recreational drug use.   Wear Seat belts while driving.

## 2019-09-07 DIAGNOSIS — Z23 Encounter for immunization: Secondary | ICD-10-CM | POA: Diagnosis not present

## 2019-09-07 DIAGNOSIS — N2589 Other disorders resulting from impaired renal tubular function: Secondary | ICD-10-CM | POA: Diagnosis not present

## 2019-09-07 DIAGNOSIS — D631 Anemia in chronic kidney disease: Secondary | ICD-10-CM | POA: Diagnosis not present

## 2019-09-07 DIAGNOSIS — N186 End stage renal disease: Secondary | ICD-10-CM | POA: Diagnosis not present

## 2019-09-07 DIAGNOSIS — E876 Hypokalemia: Secondary | ICD-10-CM | POA: Diagnosis not present

## 2019-09-07 DIAGNOSIS — R82998 Other abnormal findings in urine: Secondary | ICD-10-CM | POA: Diagnosis not present

## 2019-09-07 DIAGNOSIS — N2581 Secondary hyperparathyroidism of renal origin: Secondary | ICD-10-CM | POA: Diagnosis not present

## 2019-09-07 DIAGNOSIS — Z992 Dependence on renal dialysis: Secondary | ICD-10-CM | POA: Diagnosis not present

## 2019-09-07 DIAGNOSIS — E119 Type 2 diabetes mellitus without complications: Secondary | ICD-10-CM | POA: Diagnosis not present

## 2019-09-07 DIAGNOSIS — Z4932 Encounter for adequacy testing for peritoneal dialysis: Secondary | ICD-10-CM | POA: Diagnosis not present

## 2019-09-07 DIAGNOSIS — D509 Iron deficiency anemia, unspecified: Secondary | ICD-10-CM | POA: Diagnosis not present

## 2019-09-08 ENCOUNTER — Telehealth: Payer: Self-pay | Admitting: *Deleted

## 2019-09-08 DIAGNOSIS — Z992 Dependence on renal dialysis: Secondary | ICD-10-CM | POA: Diagnosis not present

## 2019-09-08 DIAGNOSIS — I120 Hypertensive chronic kidney disease with stage 5 chronic kidney disease or end stage renal disease: Secondary | ICD-10-CM | POA: Diagnosis not present

## 2019-09-08 DIAGNOSIS — N2581 Secondary hyperparathyroidism of renal origin: Secondary | ICD-10-CM | POA: Diagnosis not present

## 2019-09-08 DIAGNOSIS — E1122 Type 2 diabetes mellitus with diabetic chronic kidney disease: Secondary | ICD-10-CM | POA: Diagnosis not present

## 2019-09-08 DIAGNOSIS — N2589 Other disorders resulting from impaired renal tubular function: Secondary | ICD-10-CM | POA: Diagnosis not present

## 2019-09-08 DIAGNOSIS — D631 Anemia in chronic kidney disease: Secondary | ICD-10-CM | POA: Diagnosis not present

## 2019-09-08 DIAGNOSIS — Z4932 Encounter for adequacy testing for peritoneal dialysis: Secondary | ICD-10-CM | POA: Diagnosis not present

## 2019-09-08 DIAGNOSIS — R82998 Other abnormal findings in urine: Secondary | ICD-10-CM | POA: Diagnosis not present

## 2019-09-08 DIAGNOSIS — N186 End stage renal disease: Secondary | ICD-10-CM | POA: Diagnosis not present

## 2019-09-08 DIAGNOSIS — K2970 Gastritis, unspecified, without bleeding: Secondary | ICD-10-CM | POA: Diagnosis not present

## 2019-09-08 DIAGNOSIS — D509 Iron deficiency anemia, unspecified: Secondary | ICD-10-CM | POA: Diagnosis not present

## 2019-09-08 DIAGNOSIS — Z23 Encounter for immunization: Secondary | ICD-10-CM | POA: Diagnosis not present

## 2019-09-08 DIAGNOSIS — E876 Hypokalemia: Secondary | ICD-10-CM | POA: Diagnosis not present

## 2019-09-08 DIAGNOSIS — K2100 Gastro-esophageal reflux disease with esophagitis, without bleeding: Secondary | ICD-10-CM | POA: Diagnosis not present

## 2019-09-08 DIAGNOSIS — E119 Type 2 diabetes mellitus without complications: Secondary | ICD-10-CM | POA: Diagnosis not present

## 2019-09-08 NOTE — Telephone Encounter (Signed)
Called patient on 09/08/2019 , 2:31 PM in an attempt to reach the patient for a hospital follow up.   Admit date: 08/28/19 Discharge: 09/06/19   She does not have any questions or concerns about medications from the hospital admission. The patient's medications were reviewed over the phone, they were counseled to bring in all current medications to the hospital follow up visit. The patient's daughter is staying with the patient currently.  I advised the patient to call if any questions or concerns arise about the hospital admission or medications    Home health was started in the hospital. Eileen Perez  is providing physical therapy in the home. All questions were answered and a follow up appointment was made. The patient has an appointment 09/09/2019 with Garnet Sierras for her hospital follow up visit.  The patient's daughter will bring her and wants to discuss a home health nurse visiting the patient, especially to help with her in-home dialysis.  Prior to Admission medications   Medication Sig Start Date End Date Taking? Authorizing Provider  allopurinol (ZYLOPRIM) 300 MG tablet TAKE 1/2 TO 1 TABLET BY  MOUTH DAILY AS DIRECTED Patient taking differently: TAKE 1/2 TO 1 TABLET BY&nbsp;&nbsp;MOUTH DAILY AS DIRECTED 10/30/18   Liane Comber, NP  aspirin 81 MG tablet Take 81 mg by mouth at bedtime.     [provider]  b complex-vitamin c-folic acid (NEPHRO-VITE) 0.8 MG TABS tablet Take 1 tablet by mouth at bedtime.    [provider]  cinacalcet (SENSIPAR) 30 MG tablet Take 30 mg by mouth daily.     [provider]  feeding supplement, ENSURE ENLIVE, (ENSURE ENLIVE) LIQD Take 237 mLs by mouth at bedtime. 09/06/19   Debbe Odea, MD  Flaxseed, Linseed, (FLAXSEED OIL) 1000 MG CAPS Take 1 capsule by mouth daily.     [provider]  Nutritional Supplements (FEEDING SUPPLEMENT, NEPRO CARB STEADY,) LIQD Take 237 mLs by mouth 2 (two) times daily between meals. 09/06/19    Debbe Odea, MD  Omega-3 Fatty Acids (FISH OIL) 1200 MG CAPS Take 1,200 mg by mouth daily.     [provider]  pantoprazole (PROTONIX) 40 MG tablet Take 1 tablet (40 mg total) by mouth 2 (two) times daily. 04/25/19 04/24/20  Otis Brace, MD  rosuvastatin (CRESTOR) 5 MG tablet Take 1 tablet (5 mg total) by mouth daily after supper. Patient not taking: Reported on 08/29/2019 06/25/19   Liane Comber, NP  sevelamer carbonate (RENVELA) 800 MG tablet Take 800 mg by mouth 3 (three) times daily with meals.     [provider]  sucralfate (CARAFATE) 1 GM/10ML suspension Take 10 mLs (1 g total) by mouth 4 (four) times daily -  with meals and at bedtime for 10 days. 09/06/19 09/16/19  Debbe Odea, MD  ZINC GLUCONATE PO Take 40 mg by mouth daily.     [provider]

## 2019-09-09 ENCOUNTER — Other Ambulatory Visit: Payer: Self-pay

## 2019-09-09 ENCOUNTER — Ambulatory Visit (INDEPENDENT_AMBULATORY_CARE_PROVIDER_SITE_OTHER): Payer: Medicare Other | Admitting: Adult Health Nurse Practitioner

## 2019-09-09 ENCOUNTER — Encounter: Payer: Self-pay | Admitting: Adult Health Nurse Practitioner

## 2019-09-09 VITALS — BP 92/68 | HR 66 | Temp 97.5°F | Ht 61.0 in | Wt 95.2 lb

## 2019-09-09 DIAGNOSIS — E0821 Diabetes mellitus due to underlying condition with diabetic nephropathy: Secondary | ICD-10-CM

## 2019-09-09 DIAGNOSIS — N186 End stage renal disease: Secondary | ICD-10-CM

## 2019-09-09 DIAGNOSIS — K208 Other esophagitis without bleeding: Secondary | ICD-10-CM

## 2019-09-09 DIAGNOSIS — R531 Weakness: Secondary | ICD-10-CM

## 2019-09-09 DIAGNOSIS — R112 Nausea with vomiting, unspecified: Secondary | ICD-10-CM

## 2019-09-09 DIAGNOSIS — I10 Essential (primary) hypertension: Secondary | ICD-10-CM

## 2019-09-09 DIAGNOSIS — E876 Hypokalemia: Secondary | ICD-10-CM | POA: Diagnosis not present

## 2019-09-09 DIAGNOSIS — K56609 Unspecified intestinal obstruction, unspecified as to partial versus complete obstruction: Secondary | ICD-10-CM

## 2019-09-09 DIAGNOSIS — E119 Type 2 diabetes mellitus without complications: Secondary | ICD-10-CM | POA: Diagnosis not present

## 2019-09-09 DIAGNOSIS — Z4932 Encounter for adequacy testing for peritoneal dialysis: Secondary | ICD-10-CM | POA: Diagnosis not present

## 2019-09-09 DIAGNOSIS — D3A092 Benign carcinoid tumor of the stomach: Secondary | ICD-10-CM

## 2019-09-09 DIAGNOSIS — Z992 Dependence on renal dialysis: Secondary | ICD-10-CM | POA: Diagnosis not present

## 2019-09-09 DIAGNOSIS — N2589 Other disorders resulting from impaired renal tubular function: Secondary | ICD-10-CM | POA: Diagnosis not present

## 2019-09-09 DIAGNOSIS — R82998 Other abnormal findings in urine: Secondary | ICD-10-CM | POA: Diagnosis not present

## 2019-09-09 DIAGNOSIS — Z23 Encounter for immunization: Secondary | ICD-10-CM | POA: Diagnosis not present

## 2019-09-09 DIAGNOSIS — D509 Iron deficiency anemia, unspecified: Secondary | ICD-10-CM | POA: Diagnosis not present

## 2019-09-09 DIAGNOSIS — D631 Anemia in chronic kidney disease: Secondary | ICD-10-CM | POA: Diagnosis not present

## 2019-09-09 DIAGNOSIS — N2581 Secondary hyperparathyroidism of renal origin: Secondary | ICD-10-CM | POA: Diagnosis not present

## 2019-09-09 NOTE — Progress Notes (Addendum)
Hospital follow up  Assessment and Plan: Hospital visit follow up for small bowel obstruction   Eileen Perez was seen today for follow-up, emesis and fatigue.  Diagnoses and all orders for this visit:  Small bowel obstruction ([place]) Continue clear liquids and work way to bland foods  ESRD (end stage renal disease) ([place]) Will do referral for Home Health RN skilled nursing and/or infusion therapy for assistance with peritoneal dialysis -     CBC with Differential/Platelet -     COMPLETE METABOLIC PANEL WITH GFR  Diabetes mellitus due to underlying condition with diabetic nephropathy, without long-term current use of insulin (HCC) Diet controlled No medications Will continue to monitor  Essential hypertension B/P's soft, likely related to dehydration? DO NOT TAKE ANY BLOOD PRESSURE MEDICATION. HOLD Hydralazine and Labetolol until instructed to restart. Monitor blood pressure -     CBC with Differential/Platelet -     COMPLETE METABOLIC PANEL WITH GFR  Esophagitis, [place] grade B -Carafate 4 times a day Instructed daughter to obtain from pharmacy Take this for two weeks, no longer related to ESRD.  Carcinoid tumor of stomach, unspecified whether malignant Follow up with GI  Intractable nausea and vomiting Discussed Zofran OTD prescription. Stressed importance of hydration when nausea subsides. IM Phenergan in the office, symptoms subsided and able to take sips of water.  Weakness generalized Using walker at this time Will do referral for home health OT & Skilled Concern as noted above with peritoneal dialysis self care. Daughter is visiting from out of town, here 1 week, to assist for now.  Discussed patient condition and management with daughter and patient at length.  Discussed hospital precautions, if patient is not able to consume liquids after using carafate and zofran.  Discussed medications and non-essential medications related to nausea and emesis.  All  medications were reviewed with patient and family and fully reconciled. All questions answered fully, and patient and family members were encouraged to call the office with any further questions or concerns. Discussed goal to avoid readmission related to this diagnosis.  Greater than 79mn spent with patient and daughter discussing and coordinating care.  Medications Discontinued During This Encounter  Medication Reason  . hydrALAZINE (APRESOLINE) 10 MG tablet   . labetalol (NORMODYNE) 200 MG tablet Discontinued by provider    CAN NOT DO FOR BCBS REGULAR OR MEDICARE Over 40 minutes of interview.exam, counseling, chart review, and complex, high/moderate level critical decision making was performed this visit.   Future Appointments  Date Time Provider DCrow Agency 09/10/2019  3:30 PM [facility] DX DEXA 1 [facility] CE  09/19/2019  9:50 AM PNarda AmberK, DO [facility] None  10/01/2019 10:30 AM MUnk Pinto MD [facility] None  11/24/2019  8:15 AM MHayden Pedro MD TRE-TRE None  02/16/2020 12:00 PM [facility] LAB 3 [facility] None  02/16/2020 12:30 PM SLadell Pier MD [facility] None  04/26/2020 10:00 AM MUnk Pinto MD [facility] None  07/28/2020 11:15 AM CLiane Comber NP [facility] None     HPI 78y.o.female presents for follow up for transition from recent hospitalization. Admit date to the hospital was 08/28/19, patient was discharged from the hospital on 09/06/19 and our clinical staff contacted the office the day after discharge to set up a follow up appointment. The discharge summary, medications, and diagnostic test results were reviewed before meeting with the patient. The patient was admitted for abdominal pain and vomiting that started three days prior to admission and inability to complete peritoneal dialysis related to  blocked line.  She was diagnosis with small bowel obstruction and luekocytosis of 13.2, hgb10, chloride 89, bicarb 19.  CRE 9.55 with baseline  around 8, BUN 77 with baselines 42.   She has history of HTN, HLD, thyroid disease, pancreatic cyst, GERD, DMII diet controlled, anemia, diverticulosis and ESRD on peritoneal dialysis.   Home health is involved.  She is receiving PT from [facility].  Daughter reports they have made contact and come to the house for initial consultation.  She reports that there was discussion about also adding speech therapy related to her impaired/difficulty swallowing.  She is accompanied by her daughter Tami Ribas today from [place].  She is here for the next week to help her mother related to the acuity of her mothers condition and her weakness while in the hospital.  The patient had discharge instructions, daughter picked her up but did not speak with anyone regarding this. Today the patient is ambulating with a walker, she appears weak and pale.  She is alert and oriented and reports she has not improved since leaving the hospital.  It appears it is difficult for her to respond to questions related to nausea and weakness. During the appointment she had multiple episodes of emesis, yellow.  Her daughter reports she has been getting sick since she has come home.  She reports that she has tried to encourage her mother to drink fluids and the Neopro supplements.  She reports today her mother has sipped on less than half bottle of water and half of a supplement.  Patient has not been taking anything for nausea and has not been able to take any of her daily medications related to the nausea and vomiting.   She was instructed to consume a bland diet and her daughter reports that she has not been able to eat any solid food.  She is having severe reflux symptoms and difficult for her to lay down.  She has not been sleep well over the past three days.  Her LBM was at 0400 today and she reports it was liquid.  She has cramps prior to bowel movements but resolve after.  She has constant abdominal pressure.  She has  not tried to complete any home peritoneal dialysis since discharge.  Last Hemodyalisis was on 10/10 in the hospital. Highly concerned for patients ability to preform home peritoneal dialysis related to weakness.  She has been on dialysis for diabetic ESRD since 01/2016 and follows with Dr Jonnie Finner.  She is not any any diabetic medications and manages with diet at this time.  She would benefit form Occupational therapy as well as Home Health skilled nursing/ and or infusion RN to assist with dialysis until regains strength.  Patient is not back to baseline as of today.  She has follow up with GI scheduled in 4 weeks Dr Alessandra Bevels.  Daughter has called to bee seen sooner. Related to concerns stated in CT impression below.  Concerned for dehydrated related to duration of emesis since discharge.  Patient has not been using zofran for nausea and did not pick up carafate.  Unable to assess or report on any dysphagia concerns as patient has not been consuming sold food or any substantial liquid intake.    Images while in the hospital: Ct Abdomen Pelvis Wo Contrast  Result Date: 08/28/2019 CLINICAL DATA:  Abdominal distension, pain, nausea and vomiting. Peritoneal dialysis. 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 EXAM: CT ABDOMEN AND  PELVIS WITHOUT CONTRAST TECHNIQUE: Multidetector CT imaging of the abdomen and pelvis was performed following the standard protocol without IV contrast. COMPARISON:  05/20/2019 FINDINGS: Lower chest: The previously demonstrated 4 mm right middle lobe solid lung nodule measures 3 mm in maximum diameter on image number 10 series 5. Hepatobiliary: No focal liver abnormality is seen. Status post cholecystectomy. No biliary dilatation. Pancreas: Stable marked atrophy of the tail and distal body of the pancreas. Spleen: Normal in size without focal abnormality. Adrenals/Urinary Tract: No significant change in  bilateral adrenal hyperplasia. No significant change in multiple simple and complicated renal cysts in both kidneys. Poorly distended urinary bladder, obscured by streak artifacts from a right hip prosthesis. No visible ureteral abnormalities. Stomach/Bowel: Interval multiple moderately dilated loops of proximal small bowel with normal caliber or distal small bowel loops. The exact point of transition is difficult to determine. No obstructing mass visualized. Diffuse wall thickening in the antrum and pyloric regions of the stomach. This appears thicker and more heterogeneous posteriorly, measuring 2.2 cm in thickness. Multiple sigmoid colon diverticula. Vascular/Lymphatic: A previously demonstrated 10 mm short axis gastrohepatic ligament lymph node has a corresponding diameter of 8 mm on image number 14 series 3. No new or enlarging lymph nodes. Atheromatous arterial calcifications without aneurysm. Reproductive: Enlarged uterus containing multiple bulky, densely calcified fibroids. Other: The previously noted paraumbilical hernia containing fat has not changed significantly with other than some edema in the fat today. No air in the hernia today. The peritoneal dialysis catheter tip is in the inferior right pelvis, not well visualized due to the streak artifacts from the right hip prosthesis. Musculoskeletal: Right hip prosthesis with associated streak artifacts. Moderate left hip degenerative changes. Lumbar and lower thoracic spine degenerative changes and mild scoliosis. No evidence of bony metastatic disease. IMPRESSION: 1. Interval partial small bowel obstruction. The exact point of transition is difficult to determine. This is most likely due to an adhesion. 2. 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 Correlation with the exact site of tumor at the time of endoscopic resection would be helpful. 3. Stable  mildly enlarged gastrohepatic ligament lymph node suspicious for a possible metastatic node. 4. Sigmoid diverticulosis. 5. Stable paraumbilical hernia containing fat with other than some edema in the fat today. 6. Stable 4 mm right middle lobe solid lung nodule. 7. Stable bilateral simple and complicated renal cysts. 8. Stable uterine fibroids. 9. Stable marked atrophy of the tail and distal body of the pancreas. Electronically Signed   By: Claudie Revering M.D.   On: 08/28/2019 21:08   Dg Abd 1 View  Result Date: 08/29/2019 CLINICAL DATA:  Small bowel obstruction. EXAM: ABDOMEN - 1 VIEW COMPARISON:  CT scan of August 28, 2019. Radiograph of February 14, 2017. FINDINGS: Mildly dilated small bowel loops are noted concerning for distal small bowel obstruction. No colonic dilatation is noted. Status post cholecystectomy. Dialysis catheter is seen in the pelvis. Large calcified uterine fibroid is noted. IMPRESSION: Mildly dilated small bowel loops are noted concerning for distal small bowel obstruction. Electronically Signed   By: Marijo Conception M.D.   On: 08/29/2019 09:31   Ir Fluoro Guide Cv Line Right  Result Date: 08/29/2019 INDICATION: 78 year old female with a history of renal dysfunction EXAM: IMAGE GUIDED PLACEMENT OF TEMPORARY hemodialysis catheter MEDICATIONS: None ANESTHESIA/SEDATION: None FLUOROSCOPY TIME:  Fluoroscopy Time: 0 minutes 6 seconds (1 mGy). COMPLICATIONS: None PROCEDURE: Informed written consent  was obtained from the patient's family after a discussion of the risks, benefits, and alternatives to treatment. Questions regarding the procedure were encouraged and answered. The right neck was prepped with chlorhexidine in a sterile fashion, and a sterile drape was applied covering the operative field. Maximum barrier sterile technique with sterile gowns and gloves were used for the procedure. A timeout was performed prior to the initiation of the procedure. A micropuncture kit was utilized to  access the right internal jugular vein under direct, real-time ultrasound guidance after the overlying soft tissues were anesthetized with 1% lidocaine with epinephrine. Ultrasound image documentation was performed. The microwire was kinked to measure appropriate catheter length. A stiff glidewire was advanced to the level of the IVC. A 16 cm hemodialysis catheter was then placed over the wire. Final catheter positioning was confirmed and documented with a spot radiographic image. The catheter aspirates and flushes normally. The catheter was flushed with appropriate volume heparin dwells. Dressings were applied. The patient tolerated the procedure well without immediate post procedural complication. IMPRESSION: Status post right IJ placement of temporary hemodialysis catheter. Signed, Dulcy Fanny. Dellia Nims, RPVI [facility] Rad[facility] Electronically Signed   By: Corrie Mckusick D.O.   On: 08/29/2019 15:04   Ir US Guide Vasc Access Right  Result Date: 08/29/2019 INDICATION: 78 year old female with a history of renal dysfunction EXAM: IMAGE GUIDED PLACEMENT OF TEMPORARY hemodialysis catheter MEDICATIONS: None ANESTHESIA/SEDATION: None FLUOROSCOPY TIME:  Fluoroscopy Time: 0 minutes 6 seconds (1 mGy). COMPLICATIONS: None PROCEDURE: Informed written consent was obtained from the patient's family after a discussion of the risks, benefits, and alternatives to treatment. Questions regarding the procedure were encouraged and answered. The right neck was prepped with chlorhexidine in a sterile fashion, and a sterile drape was applied covering the operative field. Maximum barrier sterile technique with sterile gowns and gloves were used for the procedure. A timeout was performed prior to the initiation of the procedure. A micropuncture kit was utilized to access the right internal jugular vein under direct, real-time ultrasound guidance after the overlying soft tissues were  anesthetized with 1% lidocaine with epinephrine. Ultrasound image documentation was performed. The microwire was kinked to measure appropriate catheter length. A stiff glidewire was advanced to the level of the IVC. A 16 cm hemodialysis catheter was then placed over the wire. Final catheter positioning was confirmed and documented with a spot radiographic image. The catheter aspirates and flushes normally. The catheter was flushed with appropriate volume heparin dwells. Dressings were applied. The patient tolerated the procedure well without immediate post procedural complication. IMPRESSION: Status post right IJ placement of temporary hemodialysis catheter. Signed, Dulcy Fanny. Dellia Nims, RPVI [facility] Radiology Electronically Signed   By: Corrie Mckusick D.O.   On: 08/29/2019 15:04     Current Outpatient Medications (Endocrine & Metabolic):  .  cinacalcet (SENSIPAR) 30 MG tablet, Take 30 mg by mouth daily.   Current Outpatient Medications (Cardiovascular):  .  rosuvastatin (CRESTOR) 5 MG tablet, Take 1 tablet (5 mg total) by mouth daily after supper.   Current Outpatient Medications (Analgesics):  .  allopurinol (ZYLOPRIM) 300 MG tablet, TAKE 1/2 TO 1 TABLET BY  MOUTH DAILY AS DIRECTED (Patient taking differently: TAKE 1/2 TO 1 TABLET BY  MOUTH DAILY AS DIRECTED) .  aspirin 81 MG tablet, Take 81 mg by mouth at bedtime.    Current Outpatient Medications (Other):  .  b complex-vitamin c-folic acid (NEPHRO-VITE) 0.8 MG TABS tablet,  Take 1 tablet by mouth at bedtime. .  feeding supplement, ENSURE ENLIVE, (ENSURE ENLIVE) LIQD, Take 237 mLs by mouth at bedtime. .  Flaxseed, Linseed, (FLAXSEED OIL) 1000 MG CAPS, Take 1 capsule by mouth daily.  .  Nutritional Supplements (FEEDING SUPPLEMENT, NEPRO CARB STEADY,) LIQD, Take 237 mLs by mouth 2 (two) times daily between meals. .  Omega-3 Fatty Acids (FISH OIL) 1200 MG CAPS, Take 1,200 mg by mouth daily.  .   pantoprazole (PROTONIX) 40 MG tablet, Take 1 tablet (40 mg total) by mouth 2 (two) times daily. .  sevelamer carbonate (RENVELA) 800 MG tablet, Take 800 mg by mouth 3 (three) times daily with meals.  .  sucralfate (CARAFATE) 1 GM/10ML suspension, Take 10 mLs (1 g total) by mouth 4 (four) times daily -  with meals and at bedtime for 10 days. Marland Kitchen  ZINC GLUCONATE PO, Take 40 mg by mouth daily.  .  ondansetron (ZOFRAN-ODT) 4 MG disintegrating tablet, DIS 1 T ON TONGUE Q 8 H PRF NV .  Potassium Chloride ER 20 MEQ TBCR, TK 1 T PO QD  Past Medical History:  Diagnosis Date  . Anemia   . Arthritis    Osteoarthritis  . Chronic kidney disease    Chronic renal insufficiency, peritoneal dialysis qday sees dr Moshe Cipro  . Diabetes mellitus    Pre  . Diverticulosis   . GERD (gastroesophageal reflux disease)   . Gout   . Hiatal hernia   . History of blood transfusion   . Hyperlipidemia   . Hypertension   . Osteopenia   . Pancreatic cyst 1999  . Thyroid disease    Hyperparathyroid      Allergies  Allergen Reactions  . Ace Inhibitors Other (See Comments)    unknown  . Nsaids Other (See Comments)    unknown    ROS: all negative except above.   Physical Exam: Filed Weights   09/09/19 1423  Weight: 95 lb 3.2 oz (43.2 kg)   BP 92/68   Pulse 66   Temp (!) 97.5 F (36.4 C)   Ht _0  (1.549 m)   Wt 95 lb 3.2 oz (43.2 kg)   SpO2 99%   BMI 17.99 kg/m  General Appearance: Pale and weak. Eyes: PERRLA, EOMs, conjunctiva no swelling or erythema Sinuses: No Frontal/maxillary tenderness ENT/Mouth: Ext aud canals clear, TMs without erythema, bulging. No erythema, swelling, or exudate on post pharynx.  Tonsils not swollen or erythematous. Hearing normal.  Neck: Supple, thyroid normal.  Respiratory: Respiratory effort normal, BS equal bilaterally without rales, rhonchi, wheezing or stridor.  Cardio: RRR with no MRGs. Brisk peripheral pulses without edema.  Abdomen: Soft, tender + BS  hypoactive.  , no guarding, rebound, hernias, masses. Lymphatics: Non tender without lymphadenopathy.  Musculoskeletal: Full ROM, 3/5 strength, normal gait with rolling walker. Skin: Warm, dry without rashes, lesions, ecchymosis.  Neuro: Cranial nerves intact. Normal muscle tone, no cerebellar symptoms. Sensation intact.  Psych: Awake and oriented X 3, normal affect, Insight and Judgment appropriate.     Garnet Sierras, NP 3:00PM [facility]

## 2019-09-09 NOTE — Patient Instructions (Addendum)
Your blood pressure is still very low, DO NOT take ANY blood pressure medications at this time.   You may take the next zofran at 7:30pm then every 8 hours as needed for nausea.  It is VERY important to increase her fluid intake when her nausea has decreased,  All clear liquids.  We are concerned about dehydration.  Pick up the Carafate and take asap.  This should be taken 4 times a day and will coat the stomach.  IF you are not able to keep liquids down with the medication [provided please return to the emergency department.  We will do a referral for home health Skilled Nursing and Occupational therapy.    Clear Liquid Diet, Adult A clear liquid diet is a diet that includes only liquids and semi-liquids that you can see through. You do not eat any food on this diet. Most people need to follow this diet for only a short time. You may need to follow a clear liquid diet if:  You have a problem right before or after you have surgery.  You did not eat food for a long time.  You had any of these: ? Feeling sick to your stomach (nausea). ? Throwing up (vomiting). ? Passing a watery stool (diarrhea).  You are going to have an exam to look at parts of your digestive system.  You are going to have bowel surgery. The goals of this diet are:  To rest the stomach.  To help you clear the digestive system before an exam.  To make sure that there is enough fluid in your body.  To make sure you get some energy.  To help you get back to eating like you used to. What are tips for following this plan?  A clear liquid is a liquid or semi-liquid that you can see through when you hold it up to a light. An example of this is gelatin.  This diet does not give you all the nutrients that you need. Choose a variety of the liquids that your doctor says you can drink on this diet. That way, you will get as many nutrients as possible.  If you are not sure whether you can have certain items, ask  your doctor. If you are unable to swallow a thin liquid, you will need to thicken it before taking it. This will stop you from breathing it in (aspiration). What foods should I eat?   Water and flavored water.  Fruit juices that do not have pulp, such as cranberry juice and apple juice.  Tea and coffee without milk or cream.  Clear bouillon or broth.  Broth-based soups that have been strained.  Flavored gelatins.  Honey.  Sugar water.  Ice or frozen ice pops that do not have any milk, yogurt, fruit pieces, or fruit pulp in them.  Clear sodas.  Clear sports drinks. The items listed above may not be a complete list of what you can eat and drink. Contact a dietitian for more options. What foods should I avoid?  Juices that have pulp.  Milk.  Cream or cream-based soups.  Yogurt.  Normal foods that are not clear liquids or semi-liquids. The items listed above may not be a complete list of what you should not eat and drink. Contact a dietitian for options. Questions to ask your health care provider  How long do I need to follow this diet?  Are there any medicines that I should change while on this diet? Summary  A clear liquid diet is a diet that includes only liquids and semi-liquids that you can see through.  Some goals of this diet are to rest your stomach, make sure you get enough fluid, and give you some energy.  Avoid liquids with milk, cream, or pulp while you are on this diet. This information is not intended to replace advice given to you by your health care provider. Make sure you discuss any questions you have with your health care provider. Document Released: 10/19/2008 Document Revised: 04/29/2018 Document Reviewed: 04/29/2018 Elsevier Patient Education  2020 [place] Diet A bland diet consists of foods that are often soft and do not have a lot of fat, fiber, or extra seasonings. Foods without fat, fiber, or seasoning are easier for the body  to digest. They are also less likely to irritate your mouth, throat, stomach, and other parts of your digestive system. A bland diet is sometimes called a BRAT diet. What is my plan? Your health care provider or food and nutrition specialist (dietitian) may recommend specific changes to your diet to prevent symptoms or to treat your symptoms. These changes may include:  Eating small meals often.  Cooking food until it is soft enough to chew easily.  Chewing your food well.  Drinking fluids slowly.  Not eating foods that are very spicy, sour, or fatty.  Not eating citrus fruits, such as oranges and grapefruit. What do I need to know about this diet?  Eat a variety of foods from the bland diet food list.  Do not follow a bland diet longer than needed.  Ask your health care provider whether you should take vitamins or supplements. What foods can I eat? Grains  Hot cereals, such as cream of wheat. Rice. Bread, crackers, or tortillas made from refined white flour. Vegetables Canned or cooked vegetables. Mashed or boiled potatoes. Fruits  Bananas. Applesauce. Other types of cooked or canned fruit with the skin and seeds removed, such as canned peaches or pears. Meats and other proteins  Scrambled eggs. Creamy peanut butter or other nut butters. Lean, well-cooked meats, such as chicken or fish. Tofu. Soups or broths. Dairy Low-fat dairy products, such as milk, cottage cheese, or yogurt. Beverages  Water. Herbal tea. Apple juice. Fats and oils Mild salad dressings. Canola or olive oil. Sweets and desserts Pudding. Custard. Fruit gelatin. Ice cream. The items listed above may not be a complete list of recommended foods and beverages. Contact a dietitian for more options. What foods are not recommended? Grains Whole grain breads and cereals. Vegetables Raw vegetables. Fruits Raw fruits, especially citrus, berries, or dried fruits. Dairy Whole fat dairy  foods. Beverages Caffeinated drinks. Alcohol. Seasonings and condiments Strongly flavored seasonings or condiments. Hot sauce. Salsa. Other foods Spicy foods. Fried foods. Sour foods, such as pickled or fermented foods. Foods with high sugar content. Foods high in fiber. The items listed above may not be a complete list of foods and beverages to avoid. Contact a dietitian for more information. Summary  A bland diet consists of foods that are often soft and do not have a lot of fat, fiber, or extra seasonings.  Foods without fat, fiber, or seasoning are easier for the body to digest.  Check with your health care provider to see how long you should follow this diet plan. It is not meant to be followed for long periods. This information is not intended to replace advice given to you by your health care provider. Make sure  you discuss any questions you have with your health care provider. Document Released: 02/28/2016 Document Revised: 12/05/2017 Document Reviewed: 12/05/2017 Elsevier Patient Education  2020 [employer].

## 2019-09-10 ENCOUNTER — Emergency Department (HOSPITAL_COMMUNITY): Payer: Medicare Other

## 2019-09-10 ENCOUNTER — Inpatient Hospital Stay: Admission: RE | Admit: 2019-09-10 | Payer: Medicare Other | Source: Ambulatory Visit

## 2019-09-10 ENCOUNTER — Inpatient Hospital Stay (HOSPITAL_COMMUNITY)
Admission: EM | Admit: 2019-09-10 | Discharge: 2019-09-14 | DRG: 314 | Disposition: A | Discharge status: Home Health | Payer: Medicare Other | Attending: Internal Medicine | Admitting: Internal Medicine

## 2019-09-10 ENCOUNTER — Other Ambulatory Visit: Payer: Self-pay

## 2019-09-10 ENCOUNTER — Encounter: Payer: Self-pay | Admitting: Adult Health Nurse Practitioner

## 2019-09-10 DIAGNOSIS — K2900 Acute gastritis without bleeding: Secondary | ICD-10-CM | POA: Diagnosis not present

## 2019-09-10 DIAGNOSIS — E872 Acidosis: Secondary | ICD-10-CM | POA: Diagnosis present

## 2019-09-10 DIAGNOSIS — Z20828 Contact with and (suspected) exposure to other viral communicable diseases: Secondary | ICD-10-CM | POA: Diagnosis present

## 2019-09-10 DIAGNOSIS — I120 Hypertensive chronic kidney disease with stage 5 chronic kidney disease or end stage renal disease: Secondary | ICD-10-CM | POA: Diagnosis present

## 2019-09-10 DIAGNOSIS — M1990 Unspecified osteoarthritis, unspecified site: Secondary | ICD-10-CM | POA: Diagnosis present

## 2019-09-10 DIAGNOSIS — K2100 Gastro-esophageal reflux disease with esophagitis, without bleeding: Secondary | ICD-10-CM | POA: Diagnosis present

## 2019-09-10 DIAGNOSIS — R112 Nausea with vomiting, unspecified: Secondary | ICD-10-CM

## 2019-09-10 DIAGNOSIS — R197 Diarrhea, unspecified: Secondary | ICD-10-CM | POA: Diagnosis not present

## 2019-09-10 DIAGNOSIS — D3A092 Benign carcinoid tumor of the stomach: Secondary | ICD-10-CM | POA: Diagnosis not present

## 2019-09-10 DIAGNOSIS — K2970 Gastritis, unspecified, without bleeding: Secondary | ICD-10-CM | POA: Diagnosis present

## 2019-09-10 DIAGNOSIS — I959 Hypotension, unspecified: Principal | ICD-10-CM

## 2019-09-10 DIAGNOSIS — I9589 Other hypotension: Secondary | ICD-10-CM | POA: Diagnosis not present

## 2019-09-10 DIAGNOSIS — Z992 Dependence on renal dialysis: Secondary | ICD-10-CM

## 2019-09-10 DIAGNOSIS — Z8249 Family history of ischemic heart disease and other diseases of the circulatory system: Secondary | ICD-10-CM

## 2019-09-10 DIAGNOSIS — R1111 Vomiting without nausea: Secondary | ICD-10-CM | POA: Diagnosis not present

## 2019-09-10 DIAGNOSIS — D631 Anemia in chronic kidney disease: Secondary | ICD-10-CM | POA: Diagnosis present

## 2019-09-10 DIAGNOSIS — E43 Unspecified severe protein-calorie malnutrition: Secondary | ICD-10-CM | POA: Diagnosis not present

## 2019-09-10 DIAGNOSIS — K5790 Diverticulosis of intestine, part unspecified, without perforation or abscess without bleeding: Secondary | ICD-10-CM | POA: Diagnosis not present

## 2019-09-10 DIAGNOSIS — E860 Dehydration: Secondary | ICD-10-CM | POA: Diagnosis present

## 2019-09-10 DIAGNOSIS — D509 Iron deficiency anemia, unspecified: Secondary | ICD-10-CM | POA: Diagnosis not present

## 2019-09-10 DIAGNOSIS — Z809 Family history of malignant neoplasm, unspecified: Secondary | ICD-10-CM

## 2019-09-10 DIAGNOSIS — Z4932 Encounter for adequacy testing for peritoneal dialysis: Secondary | ICD-10-CM | POA: Diagnosis not present

## 2019-09-10 DIAGNOSIS — R531 Weakness: Secondary | ICD-10-CM

## 2019-09-10 DIAGNOSIS — Z23 Encounter for immunization: Secondary | ICD-10-CM | POA: Diagnosis not present

## 2019-09-10 DIAGNOSIS — R82998 Other abnormal findings in urine: Secondary | ICD-10-CM | POA: Diagnosis not present

## 2019-09-10 DIAGNOSIS — N186 End stage renal disease: Secondary | ICD-10-CM | POA: Diagnosis present

## 2019-09-10 DIAGNOSIS — L89152 Pressure ulcer of sacral region, stage 2: Secondary | ICD-10-CM | POA: Diagnosis present

## 2019-09-10 DIAGNOSIS — E1165 Type 2 diabetes mellitus with hyperglycemia: Secondary | ICD-10-CM | POA: Diagnosis not present

## 2019-09-10 DIAGNOSIS — E785 Hyperlipidemia, unspecified: Secondary | ICD-10-CM | POA: Diagnosis not present

## 2019-09-10 DIAGNOSIS — E1122 Type 2 diabetes mellitus with diabetic chronic kidney disease: Secondary | ICD-10-CM | POA: Diagnosis not present

## 2019-09-10 DIAGNOSIS — N2581 Secondary hyperparathyroidism of renal origin: Secondary | ICD-10-CM | POA: Diagnosis present

## 2019-09-10 DIAGNOSIS — E1151 Type 2 diabetes mellitus with diabetic peripheral angiopathy without gangrene: Secondary | ICD-10-CM | POA: Diagnosis present

## 2019-09-10 DIAGNOSIS — Z833 Family history of diabetes mellitus: Secondary | ICD-10-CM

## 2019-09-10 DIAGNOSIS — Z886 Allergy status to analgesic agent status: Secondary | ICD-10-CM

## 2019-09-10 DIAGNOSIS — Z888 Allergy status to other drugs, medicaments and biological substances status: Secondary | ICD-10-CM

## 2019-09-10 DIAGNOSIS — A498 Other bacterial infections of unspecified site: Secondary | ICD-10-CM | POA: Diagnosis not present

## 2019-09-10 DIAGNOSIS — M8580 Other specified disorders of bone density and structure, unspecified site: Secondary | ICD-10-CM | POA: Diagnosis present

## 2019-09-10 DIAGNOSIS — A0472 Enterocolitis due to Clostridium difficile, not specified as recurrent: Secondary | ICD-10-CM | POA: Diagnosis present

## 2019-09-10 DIAGNOSIS — Z681 Body mass index (BMI) 19 or less, adult: Secondary | ICD-10-CM

## 2019-09-10 DIAGNOSIS — R110 Nausea: Secondary | ICD-10-CM | POA: Diagnosis not present

## 2019-09-10 DIAGNOSIS — Z7982 Long term (current) use of aspirin: Secondary | ICD-10-CM

## 2019-09-10 DIAGNOSIS — Z79899 Other long term (current) drug therapy: Secondary | ICD-10-CM

## 2019-09-10 DIAGNOSIS — E876 Hypokalemia: Secondary | ICD-10-CM | POA: Diagnosis not present

## 2019-09-10 DIAGNOSIS — K208 Other esophagitis without bleeding: Secondary | ICD-10-CM | POA: Diagnosis not present

## 2019-09-10 DIAGNOSIS — E119 Type 2 diabetes mellitus without complications: Secondary | ICD-10-CM | POA: Diagnosis not present

## 2019-09-10 DIAGNOSIS — N2589 Other disorders resulting from impaired renal tubular function: Secondary | ICD-10-CM | POA: Diagnosis not present

## 2019-09-10 DIAGNOSIS — M109 Gout, unspecified: Secondary | ICD-10-CM | POA: Diagnosis present

## 2019-09-10 DIAGNOSIS — Z9049 Acquired absence of other specified parts of digestive tract: Secondary | ICD-10-CM

## 2019-09-10 DIAGNOSIS — Z8589 Personal history of malignant neoplasm of other organs and systems: Secondary | ICD-10-CM | POA: Diagnosis not present

## 2019-09-10 DIAGNOSIS — E861 Hypovolemia: Secondary | ICD-10-CM | POA: Diagnosis present

## 2019-09-10 DIAGNOSIS — Z0389 Encounter for observation for other suspected diseases and conditions ruled out: Secondary | ICD-10-CM | POA: Diagnosis not present

## 2019-09-10 DIAGNOSIS — L8990 Pressure ulcer of unspecified site, unspecified stage: Secondary | ICD-10-CM | POA: Diagnosis present

## 2019-09-10 LAB — COMPLETE METABOLIC PANEL WITH GFR
AG Ratio: 1.1 (calc) (ref 1.0–2.5)
ALT: 13 U/L (ref 6–29)
AST: 10 U/L (ref 10–35)
Albumin: 3.1 g/dL — ABNORMAL LOW (ref 3.6–5.1)
Alkaline phosphatase (APISO): 113 U/L (ref 37–153)
BUN/Creatinine Ratio: 9 (calc) (ref 6–22)
BUN: 78 mg/dL — ABNORMAL HIGH (ref 7–25)
CO2: 21 mmol/L (ref 20–32)
Calcium: 9.9 mg/dL (ref 8.6–10.4)
Chloride: 90 mmol/L — ABNORMAL LOW (ref 98–110)
Creat: 8.28 mg/dL — ABNORMAL HIGH (ref 0.60–0.93)
GFR, Est African American: 5 mL/min/{1.73_m2} — ABNORMAL LOW (ref >=60)
GFR, Est Non African American: 4 mL/min/{1.73_m2} — ABNORMAL LOW (ref >=60)
Globulin: 2.9 g/dL (calc) (ref 1.9–3.7)
Glucose, Bld: 176 mg/dL — ABNORMAL HIGH (ref 65–99)
Potassium: 3.2 mmol/L — ABNORMAL LOW (ref 3.5–5.3)
Sodium: 138 mmol/L (ref 135–146)
Total Bilirubin: 0.5 mg/dL (ref 0.2–1.2)
Total Protein: 6 g/dL — ABNORMAL LOW (ref 6.1–8.1)

## 2019-09-10 LAB — COMPREHENSIVE METABOLIC PANEL
ALT: 19 U/L (ref 0–44)
AST: 49 U/L — ABNORMAL HIGH (ref 15–41)
Albumin: 2.1 g/dL — ABNORMAL LOW (ref 3.5–5.0)
Alkaline Phosphatase: 99 U/L (ref 38–126)
Anion gap: 24 — ABNORMAL HIGH (ref 5–15)
BUN: 79 mg/dL — ABNORMAL HIGH (ref 8–23)
CO2: 18 mmol/L — ABNORMAL LOW (ref 22–32)
Calcium: 9.2 mg/dL (ref 8.9–10.3)
Chloride: 92 mmol/L — ABNORMAL LOW (ref 98–111)
Creatinine, Ser: 7.58 mg/dL — ABNORMAL HIGH (ref 0.44–1.00)
GFR calc Af Amer: 5 mL/min — ABNORMAL LOW (ref >60)
GFR calc non Af Amer: 5 mL/min — ABNORMAL LOW (ref >60)
Glucose, Bld: 187 mg/dL — ABNORMAL HIGH (ref 70–99)
Potassium: 4.2 mmol/L (ref 3.5–5.1)
Sodium: 134 mmol/L — ABNORMAL LOW (ref 135–145)
Total Bilirubin: 1.6 mg/dL — ABNORMAL HIGH (ref 0.3–1.2)
Total Protein: 5.7 g/dL — ABNORMAL LOW (ref 6.5–8.1)

## 2019-09-10 LAB — CBC WITH DIFFERENTIAL/PLATELET
Abs Immature Granulocytes: 0 10*3/uL (ref 0.00–0.07)
Absolute Monocytes: 1068 cells/uL — ABNORMAL HIGH (ref 200–950)
Basophils Absolute: 44 cells/uL (ref 0–200)
Basophils Absolute: 0 10*3/uL (ref 0.0–0.1)
Basophils Relative: 0.2 %
Basophils Relative: 0 %
Eosinophils Absolute: 22 cells/uL (ref 15–500)
Eosinophils Absolute: 0 10*3/uL (ref 0.0–0.5)
Eosinophils Relative: 0.1 %
Eosinophils Relative: 0 %
HCT: 34.8 % — ABNORMAL LOW (ref 35.0–45.0)
HCT: 32 % — ABNORMAL LOW (ref 36.0–46.0)
Hemoglobin: 10.9 g/dL — ABNORMAL LOW (ref 11.7–15.5)
Hemoglobin: 10.4 g/dL — ABNORMAL LOW (ref 12.0–15.0)
Lymphocytes Relative: 8 %
Lymphs Abs: 1330 cells/uL (ref 850–3900)
Lymphs Abs: 1 10*3/uL (ref 0.7–4.0)
MCH: 30 pg (ref 27.0–33.0)
MCH: 31.3 pg (ref 26.0–34.0)
MCHC: 31.3 g/dL — ABNORMAL LOW (ref 32.0–36.0)
MCHC: 32.5 g/dL (ref 30.0–36.0)
MCV: 95.9 fL (ref 80.0–100.0)
MCV: 96.4 fL (ref 80.0–100.0)
MPV: 10.3 fL (ref 7.5–12.5)
Monocytes Absolute: 1.5 10*3/uL — ABNORMAL HIGH (ref 0.1–1.0)
Monocytes Relative: 4.9 %
Monocytes Relative: 12 %
Neutro Abs: 19337 cells/uL — ABNORMAL HIGH (ref 1500–7800)
Neutro Abs: 9.8 10*3/uL — ABNORMAL HIGH (ref 1.7–7.7)
Neutrophils Relative %: 88.7 %
Neutrophils Relative %: 80 %
Platelets: 415 10*3/uL — ABNORMAL HIGH (ref 140–400)
Platelets: 308 10*3/uL (ref 150–400)
RBC: 3.63 10*6/uL — ABNORMAL LOW (ref 3.80–5.10)
RBC: 3.32 MIL/uL — ABNORMAL LOW (ref 3.87–5.11)
RDW: 14.8 % (ref 11.0–15.0)
RDW: 17 % — ABNORMAL HIGH (ref 11.5–15.5)
Total Lymphocyte: 6.1 %
WBC: 21.8 10*3/uL — ABNORMAL HIGH (ref 3.8–10.8)
WBC: 12.2 10*3/uL — ABNORMAL HIGH (ref 4.0–10.5)
nRBC: 0.3 % — ABNORMAL HIGH (ref 0.0–0.2)
nRBC: 1 /100 WBC — ABNORMAL HIGH

## 2019-09-10 LAB — LACTIC ACID, PLASMA
Lactic Acid, Venous: 2.8 mmol/L (ref 0.5–1.9)
Lactic Acid, Venous: 2.9 mmol/L (ref 0.5–1.9)

## 2019-09-10 LAB — CBG MONITORING, ED: Glucose-Capillary: 143 mg/dL — ABNORMAL HIGH (ref 70–99)

## 2019-09-10 LAB — PHOSPHORUS: Phosphorus: 6 mg/dL — ABNORMAL HIGH (ref 2.5–4.6)

## 2019-09-10 LAB — MAGNESIUM: Magnesium: 1.6 mg/dL — ABNORMAL LOW (ref 1.7–2.4)

## 2019-09-10 MED ORDER — SODIUM CHLORIDE 0.9 % IV BOLUS
500.0000 mL | Freq: Once | INTRAVENOUS | Status: AC
  Administered 2019-09-10: 500 mL via INTRAVENOUS

## 2019-09-10 MED ORDER — ONDANSETRON HCL 4 MG/2ML IJ SOLN
4.0000 mg | Freq: Four times a day (QID) | INTRAMUSCULAR | Status: DC | PRN
  Filled 2019-09-10: qty 2

## 2019-09-10 MED ORDER — CINACALCET HCL 30 MG PO TABS
30.0000 mg | ORAL_TABLET | Freq: Every day | ORAL | Status: DC
  Administered 2019-09-11 – 2019-09-14 (×4): 30 mg via ORAL
  Filled 2019-09-10 (×4): qty 1

## 2019-09-10 MED ORDER — MAGNESIUM SULFATE IN D5W 1-5 GM/100ML-% IV SOLN
1.0000 g | Freq: Once | INTRAVENOUS | Status: AC
  Administered 2019-09-11: 1 g via INTRAVENOUS
  Filled 2019-09-10: qty 100

## 2019-09-10 MED ORDER — ONDANSETRON HCL 4 MG/2ML IJ SOLN
4.0000 mg | Freq: Once | INTRAMUSCULAR | Status: AC
  Administered 2019-09-10: 4 mg via INTRAVENOUS
  Filled 2019-09-10: qty 2

## 2019-09-10 MED ORDER — ACETAMINOPHEN 650 MG RE SUPP: 650.0000 mg | Freq: Four times a day (QID) | RECTAL | Status: DC | PRN

## 2019-09-10 MED ORDER — ALLOPURINOL 300 MG PO TABS
300.0000 mg | ORAL_TABLET | ORAL | Status: DC
  Administered 2019-09-12 – 2019-09-14 (×2): 300 mg via ORAL
  Filled 2019-09-10 (×4): qty 1

## 2019-09-10 MED ORDER — PANTOPRAZOLE SODIUM 40 MG IV SOLR
40.0000 mg | Freq: Two times a day (BID) | INTRAVENOUS | Status: DC
  Administered 2019-09-10 – 2019-09-13 (×6): 40 mg via INTRAVENOUS
  Filled 2019-09-10 (×7): qty 40

## 2019-09-10 MED ORDER — HEPARIN SODIUM (PORCINE) 5000 UNIT/ML IJ SOLN
5000.0000 [IU] | Freq: Three times a day (TID) | INTRAMUSCULAR | Status: DC
  Administered 2019-09-11 – 2019-09-14 (×9): 5000 [IU] via SUBCUTANEOUS
  Filled 2019-09-10 (×9): qty 1

## 2019-09-10 MED ORDER — SEVELAMER CARBONATE 800 MG PO TABS
800.0000 mg | ORAL_TABLET | Freq: Three times a day (TID) | ORAL | Status: DC
  Administered 2019-09-11 – 2019-09-14 (×11): 800 mg via ORAL
  Filled 2019-09-10 (×12): qty 1

## 2019-09-10 MED ORDER — NEPRO/CARBSTEADY PO LIQD: 237.0000 mL | Freq: Two times a day (BID) | ORAL | Status: DC

## 2019-09-10 MED ORDER — ASPIRIN 81 MG PO CHEW
81.0000 mg | CHEWABLE_TABLET | Freq: Every day | ORAL | Status: DC
  Administered 2019-09-11 – 2019-09-13 (×3): 81 mg via ORAL
  Filled 2019-09-10 (×3): qty 1

## 2019-09-10 MED ORDER — RENA-VITE PO TABS
1.0000 | ORAL_TABLET | Freq: Every day | ORAL | Status: DC
  Administered 2019-09-11 – 2019-09-13 (×3): 1 via ORAL
  Filled 2019-09-10 (×4): qty 1

## 2019-09-10 MED ORDER — ROSUVASTATIN CALCIUM 5 MG PO TABS
5.0000 mg | ORAL_TABLET | Freq: Every day | ORAL | Status: DC
  Administered 2019-09-11 – 2019-09-13 (×3): 5 mg via ORAL
  Filled 2019-09-10 (×3): qty 1

## 2019-09-10 MED ORDER — ACETAMINOPHEN 325 MG PO TABS: 650.0000 mg | ORAL_TABLET | Freq: Four times a day (QID) | ORAL | Status: DC | PRN

## 2019-09-10 MED ORDER — SUCRALFATE 1 GM/10ML PO SUSP
1.0000 g | Freq: Three times a day (TID) | ORAL | Status: DC
  Administered 2019-09-11 – 2019-09-13 (×9): 1 g via ORAL
  Filled 2019-09-10 (×11): qty 10

## 2019-09-10 MED ORDER — INSULIN ASPART 100 UNIT/ML ~~LOC~~ SOLN
0.0000 [IU] | Freq: Three times a day (TID) | SUBCUTANEOUS | Status: DC
  Administered 2019-09-12: 1 [IU] via SUBCUTANEOUS
  Administered 2019-09-12: 2 [IU] via SUBCUTANEOUS
  Administered 2019-09-13: 1 [IU] via SUBCUTANEOUS
  Administered 2019-09-14: 2 [IU] via SUBCUTANEOUS

## 2019-09-10 NOTE — ED Notes (Signed)
Patient transported to X-ray 

## 2019-09-10 NOTE — ED Triage Notes (Signed)
Pt presents from home via EMS for hypotension. Has been experiencing N/V/D x2 months related to esophagitis. Today, experienced increased weakness and low BP.  Liquid diet at home. Pt peritoneal dialysis, states that "I did something wrong with it last night" but is unable to tell staff if she received too much or too little H/o DM EMS provided 500cc NS, increase BP from 60/40to 90/50

## 2019-09-10 NOTE — H&P (Signed)
History and Physical    Eileen Perez VGK:[id number] DOB: [birth year] DOA: 09/10/2019  PCP: Unk Pinto, MD Patient coming from: Home  Chief Complaint: Low blood pressure  HPI: Eileen Perez is a 78 y.o. 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  History provided by patient and daughter at bedside.  Patient's blood pressure has been low since her recent hospital discharge.  She has not been taking her blood pressure medications.  She does peritoneal dialysis at home.  She has continued to vomit since her hospital discharge.  She has also been having multiple episodes of watery, nonbloody diarrhea since her hospital discharge.  No recent antibiotic use.  Denies fevers or abdominal pain.  ED Course: Blood pressure 60/40 per EMS and improved to 90/50 after patient was given a 500 cc normal saline bolus.  Not tachycardic or febrile.  White blood cell count 12.2, significantly improved compared to labs done yesterday.  Lactic acid 2.8 >2.9.  Blood culture x2 pending.  Hemoglobin 10.4, recently ranging from 10.9-11.6.  T bili 1.6, AST 49.  ALT and alk phos normal.  Magnesium 1.6.  C. difficile panel pending.  GI pathogen panel pending.  Abdominal x-ray showing no evidence of bowel obstruction or free intraperitoneal air.  Chest x-ray showing no acute cardiopulmonary disease. Patient received Zofran and a 500 cc normal saline bolus.  Review of Systems:  All systems reviewed and apart from history of presenting illness, are negative.  Past Medical History:  Diagnosis Date  . Anemia   . Arthritis    Osteoarthritis  . Chronic kidney disease    Chronic renal insufficiency, peritoneal dialysis qday  sees dr Moshe Cipro  . Diabetes mellitus    Pre  . Diverticulosis   . GERD (gastroesophageal reflux disease)   . Gout   . Hiatal hernia   . History of blood transfusion   . Hyperlipidemia   . Hypertension   . Osteopenia   . Pancreatic cyst 1999  . Thyroid disease    Hyperparathyroid     Past Surgical History:  Procedure Laterality Date  . BIOPSY  04/25/2019   Procedure: BIOPSY;  Surgeon: Otis Brace, MD;  Location: [facility];  Service: Gastroenterology;;  . BIOPSY  09/02/2019   Procedure: BIOPSY;  Surgeon: Otis Brace, MD;  Location: [facility];  Service: Gastroenterology;;  . CHOLECYSTECTOMY    . COLONOSCOPY  03/21/12   Next one in 2018  . COLONOSCOPY WITH PROPOFOL N/A 04/25/2019   Procedure: COLONOSCOPY WITH PROPOFOL;  Surgeon: Otis Brace, MD;  Location: [facility];  Service: Gastroenterology;  Laterality: N/A;  . ESOPHAGOGASTRODUODENOSCOPY (EGD) WITH PROPOFOL N/A 04/25/2019   Procedure: ESOPHAGOGASTRODUODENOSCOPY (EGD) WITH PROPOFOL;  Surgeon: Otis Brace, MD;  Location: [facility];  Service: Gastroenterology;  Laterality: N/A;  . ESOPHAGOGASTRODUODENOSCOPY (EGD) WITH PROPOFOL N/A 09/02/2019   Procedure: ESOPHAGOGASTRODUODENOSCOPY (EGD) WITH PROPOFOL;  Surgeon: Otis Brace, MD;  Location: [facility];  Service: Gastroenterology;  Laterality: N/A;  . EYE SURGERY Left    cataract extraction with IOL  . INSERTION OF DIALYSIS CATHETER N/A 02/15/2017   Procedure: REVISION OF DIALYSIS CATHETER;  Surgeon: Algernon Huxley, MD;  Location: [facility];  Service: Cardiovascular;  Laterality: N/A;  . IR FLUORO GUIDE CV LINE RIGHT  08/29/2019  . IR US GUIDE VASC ACCESS RIGHT  08/29/2019  .  JOINT REPLACEMENT  2012   left knee  . left knee replacement     . PANCREATIC CYST EXCISION  1999  . POLYPECTOMY  04/25/2019   Procedure: POLYPECTOMY;  Surgeon: Otis Brace, MD;  Location: [facility];  Service: Gastroenterology;;  . Clide Deutscher  04/25/2019    Procedure: SCLEROTHERAPY;  Surgeon: Otis Brace, MD;  Location: [facility];  Service: Gastroenterology;;  . TOTAL HIP ARTHROPLASTY Right 05/26/2015   Procedure: RIGHT TOTAL HIP ARTHROPLASTY ANTERIOR APPROACH;  Surgeon: Gaynelle Arabian, MD;  Location: [facility];  Service: Orthopedics;  Laterality: Right;  . TOTAL KNEE ARTHROPLASTY Right 06/09/2013   Procedure: RIGHT TOTAL KNEE ARTHROPLASTY;  Surgeon: Gearlean Alf, MD;  Location: [facility];  Service: Orthopedics;  Laterality: Right;     reports that she has never smoked. She has never used smokeless tobacco. She reports that she does not drink alcohol or use drugs.  Allergies  Allergen Reactions  . Ace Inhibitors Other (See Comments)    unknown  . Nsaids Other (See Comments)    unknown    Family History  Problem Relation Age of Onset  . Cancer Father        Prostate and Throat  . Hypertension Mother   . Cancer Mother        Colon with METS  . Diabetes Mother     Prior to Admission medications   Medication Sig Start Date End Date Taking? Authorizing Provider  allopurinol (ZYLOPRIM) 300 MG tablet TAKE 1/2 TO 1 TABLET BY  MOUTH DAILY AS DIRECTED Patient taking differently: TAKE 1/2 TO 1 TABLET BY&nbsp;&nbsp;MOUTH DAILY AS DIRECTED 10/30/18  Yes Liane Comber, NP  aspirin 81 MG tablet Take 81 mg by mouth at bedtime.    Yes [provider]  b complex-vitamin c-folic acid (NEPHRO-VITE) 0.8 MG TABS tablet Take 1 tablet by mouth at bedtime.   Yes [provider]  cinacalcet (SENSIPAR) 30 MG tablet Take 30 mg by mouth daily.    Yes [provider]  feeding supplement, ENSURE ENLIVE, (ENSURE ENLIVE) LIQD Take 237 mLs by mouth at bedtime. 09/06/19  Yes Rizwan, Eunice Blase, MD  Flaxseed, Linseed, (FLAXSEED OIL) 1000 MG CAPS Take 1 capsule by mouth daily.    Yes [provider]  Nutritional Supplements (FEEDING SUPPLEMENT, NEPRO CARB STEADY,) LIQD Take 237 mLs by mouth 2 (two) times daily between meals. 09/06/19   Yes Debbe Odea, MD  Omega-3 Fatty Acids (FISH OIL) 1200 MG CAPS Take 1,200 mg by mouth daily.    Yes [provider]  ondansetron (ZOFRAN-ODT) 4 MG disintegrating tablet Take 4 mg by mouth every 8 (eight) hours as needed for nausea or vomiting.  08/25/19  Yes [provider]  pantoprazole (PROTONIX) 40 MG tablet Take 1 tablet (40 mg total) by mouth 2 (two) times daily. 04/25/19 04/24/20 Yes Brahmbhatt, Parag, MD  Potassium Chloride ER 20 MEQ TBCR Take 20 mEq by mouth daily.  08/26/19  Yes [provider]  rosuvastatin (CRESTOR) 5 MG tablet Take 1 tablet (5 mg total) by mouth daily after supper. 06/25/19  Yes Liane Comber, NP  sevelamer carbonate (RENVELA) 800 MG tablet Take 800 mg by mouth 3 (three) times daily with meals.    Yes [provider]  sucralfate (CARAFATE) 1 GM/10ML suspension Take 10 mLs (1 g total) by mouth 4 (four) times daily -  with meals and at bedtime for 10 days. 09/06/19 09/16/19 Yes Debbe Odea, MD  ZINC GLUCONATE PO Take 40 mg by mouth daily.  Yes [provider]    Physical Exam: Vitals:   09/10/19 2300 09/10/19 2330 09/11/19 0000 09/11/19 0058  BP: (!) 86/48 (!) 90/47 (!) 99/44 122/63  Pulse: 81 80  78  Resp: 19 (!) 24 (!) 21 19  Temp:    97.7 F (36.5 C)  TempSrc:    Oral  SpO2: 100% 100%  90%  Weight:      Height:        Physical Exam  Constitutional: She is oriented to person, place, and time. No distress.  HENT:  Head: Normocephalic.  Dry mucous membranes  Eyes: Right eye exhibits no discharge. Left eye exhibits no discharge.  Neck: Neck supple.  Cardiovascular: Normal rate, regular rhythm and intact distal pulses.  Pulmonary/Chest: Effort normal. No respiratory distress. She has no wheezes. She has no rales.  Abdominal: Soft. Bowel sounds are normal. She exhibits no distension. There is no abdominal tenderness. There is no guarding.  Musculoskeletal:        General: No edema.  Neurological: She is alert  and oriented to person, place, and time.  Skin: Skin is warm and dry. She is not diaphoretic.     Labs on Admission: I have personally reviewed following labs and imaging studies  CBC: Recent Labs  Lab 09/04/19 1015 09/06/19 0742 09/09/19 1555 09/10/19 1519  WBC 17.4* 10.0 21.8* 12.2*  NEUTROABS  --   --  19,337* 9.8*  HGB 11.4* 11.6* 10.9* 10.4*  HCT 35.4* 35.9* 34.8* 32.0*  MCV 97.0 95.5 95.9 96.4  PLT 345 364 415* 867   Basic Metabolic Panel: Recent Labs  Lab 09/05/19 0654 09/06/19 0324 09/09/19 1555 09/10/19 1519  NA 134* 134* 138 134*  K 3.2* 3.3* 3.2* 4.2  CL 92* 92* 90* 92*  CO2 21* 20* 21 18*  GLUCOSE 183* 180* 176* 187*  BUN 63* 70* 78* 79*  CREATININE 8.33* 8.56* 8.28* 7.58*  CALCIUM 9.8 10.0 9.9 9.2  MG  --   --   --  1.6*  PHOS 5.8* 6.9*  --  6.0*   GFR: Estimated Creatinine Clearance: 4.1 mL/min (A) (by C-G formula based on SCr of 7.58 mg/dL (H)). Liver Function Tests: Recent Labs  Lab 09/05/19 0654 09/06/19 0324 09/09/19 1555 09/10/19 1519  AST  --   --  10 49*  ALT  --   --  13 19  ALKPHOS  --   --   --  99  BILITOT  --   --  0.5 1.6*  PROT  --   --  6.0* 5.7*  ALBUMIN 2.4* 2.3*  --  2.1*   No results for input(s): LIPASE, AMYLASE in the last 168 hours. No results for input(s): AMMONIA in the last 168 hours. Coagulation Profile: No results for input(s): INR, PROTIME in the last 168 hours. Cardiac Enzymes: No results for input(s): CKTOTAL, CKMB, CKMBINDEX, TROPONINI in the last 168 hours. BNP (last 3 results) No results for input(s): PROBNP in the last 8760 hours. HbA1C: No results for input(s): HGBA1C in the last 72 hours. CBG: Recent Labs  Lab 09/05/19 2152 09/06/19 0752 09/06/19 1138 09/06/19 1635 09/10/19 2304  GLUCAP 215* 179* 147* 146* 143*   Lipid Profile: No results for input(s): CHOL, HDL, LDLCALC, TRIG, CHOLHDL, LDLDIRECT in the last 72 hours. Thyroid Function Tests: No results for input(s): TSH, T4TOTAL, FREET4,  T3FREE, THYROIDAB in the last 72 hours. Anemia Panel: No results for input(s): VITAMINB12, FOLATE, FERRITIN, TIBC, IRON, RETICCTPCT in the last 72 hours.  Urine analysis:    Component Value Date/Time   COLORURINE YELLOW 03/25/2019 1548   APPEARANCEUR TURBID (A) 03/25/2019 1548   LABSPEC 1.007 03/25/2019 1548   PHURINE 7.0 03/25/2019 1548   GLUCOSEU NEGATIVE 03/25/2019 1548   HGBUR 2+ (A) 03/25/2019 1548   BILIRUBINUR NEGATIVE 12/08/2016 1115   KETONESUR NEGATIVE 03/25/2019 1548   PROTEINUR 2+ (A) 03/25/2019 1548   UROBILINOGEN 0.2 05/19/2015 0942   NITRITE NEGATIVE 03/25/2019 1548   LEUKOCYTESUR 3+ (A) 03/25/2019 1548    Radiological Exams on Admission: Dg Abd Acute 2+v W 1v Chest  Result Date: 09/10/2019 CLINICAL DATA:  Concern for small bowel obstruction EXAM: DG ABDOMEN ACUTE W/ 1V CHEST COMPARISON:  Abdominal radiograph dated 09/05/2019 FINDINGS: There is no evidence of dilated bowel loops or free intraperitoneal air. Degenerative changes are seen in the left hip and lumbar spine. A right hip arthroplasty is seen. A peritoneal dialysis catheter is noted in the pelvis. A calcified fibroid is redemonstrated. Heart size and mediastinal contours are within normal limits. Vascular calcifications are seen in the aortic arch. Both lungs are clear. IMPRESSION: No evidence of bowel obstruction or free intraperitoneal air. No acute cardiopulmonary findings. Aortic Atherosclerosis (ICD10-I70.0). Electronically Signed   By: Zerita Boers M.D.   On: 09/10/2019 17:20    EKG: Independently reviewed.  Sinus rhythm, low voltage.  No acute ischemic changes.  Assessment/Plan Principal Problem:   Hypotension Active Problems:   ESRD (end stage renal disease) (HCC)   Intractable nausea and vomiting   Diarrhea   Hypomagnesemia   Hypotension Suspect related to volume depletion from peritoneal dialysis and ongoing vomiting and diarrhea.  Sepsis less likely given no fever or tachycardia.  White blood  cell count improved compared to labs done a day ago.  Lactic acid mildly elevated, suspect related to dehydration.  Appears dry on exam.  Undergoes peritoneal dialysis but peritonitis less likely given no fever or abdominal pain.  Hypotension less likely related to acute blood loss anemia as no significant change in hemoglobin compared to recent labs.  Received 1.5 L fluid boluses and blood pressure has now improved. -Admit to progressive care unit.  Monitor vitals closely. -Blood culture x2 pending -Continue to trend lactate  Intractable nausea and vomiting Likely multifactorial.  Patient has carcinoid tumor of the stomach.  EGD done 10/13 with evidence of esophagitis and diffuse gastritis.  No complaints of abdominal pain and exam benign.  Abdominal x-ray without evidence of bowel obstruction. -IV Zofran as needed -IV Protonix 40 mg twice daily -Continue sucralfate -Consult GI in a.m.  Diarrhea No complaints of abdominal pain and exam benign.  Diarrhea is nonbloody.  No fever.  White blood cell count improved compared to labs done a day ago. -Loperamide as needed -GI pathogen panel and C. difficile PCR pending  ESRD on peritoneal dialysis Hypotensive due to volume depletion.  Received fluid boluses and blood pressure has now improved. -Consult nephrology in a.m.  Mildly elevated LFTs T bili 1.6, AST 49.  ALT and alk phos normal.  History of cholecystectomy. -Repeat LFTs in a.m.  If still elevated, consider obtaining ultrasound.  Hypomagnesemia Magnesium 1.6. -Replete  History of prediabetes -Sliding scale insulin sensitive and CBG checks.  DVT prophylaxis: Subcutaneous heparin Code Status: Patient wishes to be full code. Family Communication: Daughter at bedside. Disposition Plan: Anticipate discharge after clinical improvement. Consults called: None Admission status: It is my clinical opinion that referral for OBSERVATION is reasonable and necessary in this patient based on  the  above information provided. The aforementioned taken together are felt to place the patient at high risk for further clinical deterioration. However it is anticipated that the patient may be medically stable for discharge from the hospital within 24 to 48 hours.  The medical decision making on this patient was of high complexity and the patient is at high risk for clinical deterioration, therefore this is a level 3 visit.  Shela Leff MD [facility] Pager [phone]  If 7PM-7AM, please contact night-coverage [email or web address] Password [email or web address]  09/11/2019, 1:03 AM

## 2019-09-10 NOTE — ED Notes (Signed)
ED TO INPATIENT HANDOFF REPORT  ED Nurse Name and Phone #:  [phone]  S Name/Age/Gender Eileen Perez 78 y.o. female Room/Bed: 020C/020C  Code Status   Code Status: Full Code  Home/SNF/Other Home Patient oriented to: self, place, time and situation Is this baseline? Yes   Triage Complete: Triage complete  Chief Complaint hypotension  Triage Note Pt presents from home via EMS for hypotension. Has been experiencing N/V/D x2 months related to esophagitis. Today, experienced increased weakness and low BP.  Liquid diet at home. Pt peritoneal dialysis, states that "I did something wrong with it last night" but is unable to tell staff if she received too much or too little H/o DM EMS provided 500cc NS, increase BP from 60/40to 90/50   Allergies Allergies  Allergen Reactions  . Ace Inhibitors Other (See Comments)    unknown  . Nsaids Other (See Comments)    unknown    Level of Care/Admitting Diagnosis ED Disposition    ED Disposition Condition Comment   Admit  Hospital Area: [facility] [[id number]]  Level of Care: Progressive [102]  I expect the patient will be discharged within 24 hours: No (not a candidate for 5C-Observation unit)  Covid Evaluation: Asymptomatic Screening Protocol (No Symptoms)  Diagnosis: Hypotension [[id number]]  Admitting Physician: Shela Leff [[id number]]  Attending Physician: Shela Leff [[id number]]  PT Class (Do Not Modify): Observation [104]  PT Acc Code (Do Not Modify): Observation [10022]       B Medical/Surgery History Past Medical History:  Diagnosis Date  . Anemia   . Arthritis    Osteoarthritis  . Chronic kidney disease    Chronic renal insufficiency, peritoneal dialysis qday sees dr Moshe Cipro  . Diabetes mellitus    Pre  . Diverticulosis   . GERD (gastroesophageal reflux disease)   . Gout   . Hiatal hernia   . History of blood transfusion   . Hyperlipidemia   . Hypertension   . Osteopenia   .  Pancreatic cyst 1999  . Thyroid disease    Hyperparathyroid    Past Surgical History:  Procedure Laterality Date  . BIOPSY  04/25/2019   Procedure: BIOPSY;  Surgeon: Otis Brace, MD;  Location: [facility];  Service: Gastroenterology;;  . BIOPSY  09/02/2019   Procedure: BIOPSY;  Surgeon: Otis Brace, MD;  Location: [facility];  Service: Gastroenterology;;  . CHOLECYSTECTOMY    . COLONOSCOPY  03/21/12   Next one in 2018  . COLONOSCOPY WITH PROPOFOL N/A 04/25/2019   Procedure: COLONOSCOPY WITH PROPOFOL;  Surgeon: Otis Brace, MD;  Location: [facility];  Service: Gastroenterology;  Laterality: N/A;  . ESOPHAGOGASTRODUODENOSCOPY (EGD) WITH PROPOFOL N/A 04/25/2019   Procedure: ESOPHAGOGASTRODUODENOSCOPY (EGD) WITH PROPOFOL;  Surgeon: Otis Brace, MD;  Location: [facility];  Service: Gastroenterology;  Laterality: N/A;  . ESOPHAGOGASTRODUODENOSCOPY (EGD) WITH PROPOFOL N/A 09/02/2019   Procedure: ESOPHAGOGASTRODUODENOSCOPY (EGD) WITH PROPOFOL;  Surgeon: Otis Brace, MD;  Location: [facility];  Service: Gastroenterology;  Laterality: N/A;  . EYE SURGERY Left    cataract extraction with IOL  . INSERTION OF DIALYSIS CATHETER N/A 02/15/2017   Procedure: REVISION OF DIALYSIS CATHETER;  Surgeon: Algernon Huxley, MD;  Location: [facility];  Service: Cardiovascular;  Laterality: N/A;  . IR FLUORO GUIDE CV LINE RIGHT  08/29/2019  . IR US GUIDE VASC ACCESS RIGHT  08/29/2019  . JOINT REPLACEMENT  2012   left knee  . left knee replacement     . PANCREATIC CYST EXCISION  1999  . POLYPECTOMY  04/25/2019   Procedure: POLYPECTOMY;  Surgeon: Otis Brace, MD;  Location: [facility];  Service: Gastroenterology;;  . SCLEROTHERAPY  04/25/2019   Procedure: SCLEROTHERAPY;  Surgeon: Otis Brace, MD;  Location: [facility];  Service: Gastroenterology;;  . TOTAL HIP ARTHROPLASTY Right 05/26/2015   Procedure: RIGHT TOTAL HIP ARTHROPLASTY ANTERIOR APPROACH;  Surgeon: Gaynelle Arabian, MD;   Location: [facility];  Service: Orthopedics;  Laterality: Right;  . TOTAL KNEE ARTHROPLASTY Right 06/09/2013   Procedure: RIGHT TOTAL KNEE ARTHROPLASTY;  Surgeon: Gearlean Alf, MD;  Location: [facility];  Service: Orthopedics;  Laterality: Right;     A IV Location/Drains/Wounds Patient Lines/Drains/Airways Status   Active Line/Drains/Airways    Name:   Placement date:   Placement time:   Site:   Days:   Peripheral IV 09/10/19 Right Antecubital   09/10/19    1441    Antecubital   less than 1   Incision (Closed) 05/26/15 Hip Right   05/26/15    0954     1568   Incision (Closed) 02/15/17 Abdomen   02/15/17    1802     937   Pressure Injury 09/10/19 Coccyx Mid Stage II -  Partial thickness loss of dermis presenting as a shallow open ulcer with a red, pink wound bed without slough.   09/10/19    1515     less than 1          Intake/Output Last 24 hours  Intake/Output Summary (Last 24 hours) at 09/10/2019 2347 Last data filed at 09/10/2019 1818 Gross per 24 hour  Intake 1000 ml  Output -  Net 1000 ml    Labs/Imaging Results for orders placed or performed during the hospital encounter of 09/10/19 (from the past 48 hour(s))  Comprehensive metabolic panel     Status: Abnormal   Collection Time: 09/10/19  3:19 PM  Result Value Ref Range   Sodium 134 (L) 135 - 145 mmol/L   Potassium 4.2 3.5 - 5.1 mmol/L    Comment: HEMOLYSIS AT THIS LEVEL MAY AFFECT RESULT   Chloride 92 (L) 98 - 111 mmol/L   CO2 18 (L) 22 - 32 mmol/L   Glucose, Bld 187 (H) 70 - 99 mg/dL   BUN 79 (H) 8 - 23 mg/dL   Creatinine, Ser 7.58 (H) 0.44 - 1.00 mg/dL   Calcium 9.2 8.9 - 10.3 mg/dL   Total Protein 5.7 (L) 6.5 - 8.1 g/dL   Albumin 2.1 (L) 3.5 - 5.0 g/dL   AST 49 (H) 15 - 41 U/L   ALT 19 0 - 44 U/L   Alkaline Phosphatase 99 38 - 126 U/L   Total Bilirubin 1.6 (H) 0.3 - 1.2 mg/dL   GFR calc non Af Amer 5 (L) >60 mL/min   GFR calc Af Amer 5 (L) >60 mL/min   Anion gap 24 (H) 5 - 15    Comment: Performed at [facility], [address] [place],  [address]  CBC with Differential     Status: Abnormal   Collection Time: 09/10/19  3:19 PM  Result Value Ref Range   WBC 12.2 (H) 4.0 - 10.5 K/uL   RBC 3.32 (L) 3.87 - 5.11 MIL/uL   Hemoglobin 10.4 (L) 12.0 - 15.0 g/dL   HCT 32.0 (L) 36.0 - 46.0 %   MCV 96.4 80.0 - 100.0 fL   MCH 31.3 26.0 - 34.0 pg   MCHC 32.5 30.0 - 36.0 g/dL   RDW 17.0 (H) 11.5 - 15.5 %  Platelets 308 150 - 400 K/uL   nRBC 0.3 (H) 0.0 - 0.2 %   Neutrophils Relative % 80 %   Neutro Abs 9.8 (H) 1.7 - 7.7 K/uL   Lymphocytes Relative 8 %   Lymphs Abs 1.0 0.7 - 4.0 K/uL   Monocytes Relative 12 %   Monocytes Absolute 1.5 (H) 0.1 - 1.0 K/uL   Eosinophils Relative 0 %   Eosinophils Absolute 0.0 0.0 - 0.5 K/uL   Basophils Relative 0 %   Basophils Absolute 0.0 0.0 - 0.1 K/uL   nRBC 1 (H) 0 /100 WBC   Abs Immature Granulocytes 0.00 0.00 - 0.07 K/uL   Tear Drop Cells PRESENT    Polychromasia PRESENT     Comment: Performed at [facility] [address] [place], [place] [address]  Magnesium     Status: Abnormal   Collection Time: 09/10/19  3:19 PM  Result Value Ref Range   Magnesium 1.6 (L) 1.7 - 2.4 mg/dL    Comment: Performed at [facility] [address] [place], [place] [address]  Phosphorus     Status: Abnormal   Collection Time: 09/10/19  3:19 PM  Result Value Ref Range   Phosphorus 6.0 (H) 2.5 - 4.6 mg/dL    Comment: Performed at [facility] [address] [place], [place] [address]  Lactic acid, plasma     Status: Abnormal   Collection Time: 09/10/19  3:19 PM  Result Value Ref Range   Lactic Acid, Venous 2.8 (HH) 0.5 - 1.9 mmol/L    Comment: CRITICAL RESULT CALLED TO, READ BACK BY AND VERIFIED WITH: B HONEYCUTT,RN 1614 09/10/2019 WBOND Performed at [facility], [place] [address], [place], [place] [address]   Lactic acid, plasma     Status: Abnormal   Collection Time: 09/10/19  5:19 PM  Result Value Ref Range   Lactic Acid,  Venous 2.9 (HH) 0.5 - 1.9 mmol/L    Comment: CRITICAL VALUE NOTED.  VALUE IS CONSISTENT WITH PREVIOUSLY REPORTED AND CALLED VALUE. Performed at [facility], [place] [address] [place], [place] [address]   CBG monitoring, ED     Status: Abnormal   Collection Time: 09/10/19 11:04 PM  Result Value Ref Range   Glucose-Capillary 143 (H) 70 - 99 mg/dL   Dg Abd Acute 2+v W 1v Chest  Result Date: 09/10/2019 CLINICAL DATA:  Concern for small bowel obstruction EXAM: DG ABDOMEN ACUTE W/ 1V CHEST COMPARISON:  Abdominal radiograph dated 09/05/2019 FINDINGS: There is no evidence of dilated bowel loops or free intraperitoneal air. Degenerative changes are seen in the left hip and lumbar spine. A right hip arthroplasty is seen. A peritoneal dialysis catheter is noted in the pelvis. A calcified fibroid is redemonstrated. Heart size and mediastinal contours are within normal limits. Vascular calcifications are seen in the aortic arch. Both lungs are clear. IMPRESSION: No evidence of bowel obstruction or free intraperitoneal air. No acute cardiopulmonary findings. Aortic Atherosclerosis (ICD10-I70.0). Electronically Signed   By: Zerita Boers M.D.   On: 09/10/2019 17:20    Pending Labs Unresulted Labs (From admission, onward)    Start     Ordered   09/11/19 0500  CBC  Tomorrow morning,   R     09/10/19 2141   09/11/19 0500  Comprehensive metabolic panel  Tomorrow morning,   R     09/10/19 2141   09/10/19 2141  Lactic acid, plasma  Once,   STAT  Comments: Collect after fluid bolus.    09/10/19 2141   09/10/19 2033  Blood culture (routine x 2)  BLOOD CULTURE X 2,   STAT     09/10/19 2032   09/10/19 2033  GI pathogen panel by PCR, stool  (Gastrointestinal Panel by PCR, Stool                                                                                                                                                     *Does Not include CLOSTRIDIUM DIFFICILE testing.**If CDIFF testing is needed, select  the C Difficile Quick Screen w PCR reflex order below)  Once,   STAT     09/10/19 2032   09/10/19 1805  C difficile quick scan w PCR reflex  (C Difficile quick screen w PCR reflex panel)  Once, for 24 hours,   STAT     09/10/19 1804          Vitals/Pain Today's Vitals   09/10/19 2145 09/10/19 2207 09/10/19 2230 09/10/19 2300  BP: (!) 90/50 (!) 86/42 (!) 92/47 (!) 86/48  Pulse: 82  80 81  Resp: (!) 25  (!) 22 19  Temp:      TempSrc:      SpO2: (!) 87%  99% 100%  Weight:      Height:      PainSc:        Isolation Precautions Enteric precautions (UV disinfection)  Medications Medications  ondansetron (ZOFRAN) injection 4 mg (has no administration in time range)  allopurinol (ZYLOPRIM) tablet 300 mg (has no administration in time range)  rosuvastatin (CRESTOR) tablet 5 mg (has no administration in time range)  aspirin chewable tablet 81 mg (has no administration in time range)  cinacalcet (SENSIPAR) tablet 30 mg (has no administration in time range)  pantoprazole (PROTONIX) injection 40 mg (40 mg Intravenous Given 09/10/19 2217)  sevelamer carbonate (RENVELA) tablet 800 mg (has no administration in time range)  sucralfate (CARAFATE) 1 GM/10ML suspension 1 g (has no administration in time range)  multivitamin (RENA-VIT) tablet 1 tablet (has no administration in time range)  feeding supplement (NEPRO CARB STEADY) liquid 237 mL (has no administration in time range)  heparin injection 5,000 Units (has no administration in time range)  acetaminophen (TYLENOL) tablet 650 mg (has no administration in time range)    Or  acetaminophen (TYLENOL) suppository 650 mg (has no administration in time range)  insulin aspart (novoLOG) injection 0-9 Units (has no administration in time range)  magnesium sulfate IVPB 1 g 100 mL (has no administration in time range)  sodium chloride 0.9 % bolus 500 mL (0 mLs Intravenous Stopped 09/10/19 1818)  ondansetron (ZOFRAN) injection 4 mg (4 mg  Intravenous Given 09/10/19 1600)  sodium chloride 0.9 % bolus 500 mL (500 mLs Intravenous New Bag/Given 09/10/19 2218)    Mobility  non-ambulatory     Focused Assessments Cardiac Assessment Handoff:  Cardiac Rhythm: Normal sinus rhythm Lab Results  Component Value Date   TROPONINI <0.03 01/04/2019   No results found for: DDIMER Does the Patient currently have chest pain? No     R Recommendations: See Admitting Provider Note  Report given to:   Additional Notes:

## 2019-09-10 NOTE — ED Notes (Signed)
Pt able to tolerate small sips of lukewarm water, states "it's still uncomfortable because of the lump in my throat , but it feels a little better than when I was at home. I can actually get it down."

## 2019-09-10 NOTE — ED Provider Notes (Signed)
[facility] EMERGENCY DEPARTMENT Provider Note   CSN: [id number] Arrival date & time: 09/10/19  1437     History   Chief Complaint Chief Complaint  Patient presents with  . Hypotension    HPI Eileen Perez is a 78 y.o. female.     HPI  78 year old female comes in a chief complaint of low BP, weakness. 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  She was just discharged from the hospital within the last 1 week.  Patient was admitted for small bowel obstruction.  Patient reports that after being discharged she continues to have nausea throughout the day.  She gets about 2-3 episodes of emesis and few episodes of loose bowel movements.  She saw her PCP yesterday and they were concerned that she was getting dehydrated.  She was advised to come to the ER if her symptoms are progressing or she was feeling unwell.  Today, family decided to bring her in as patient felt weaker.  Per EMS records, patient was hypotensive when they first arrived.  She was given 100 cc of bolus along with Zofran.  Patient is taking Carafate.  She does not think she was prescribed anything for nausea or diarrhea.  She denies any recent antibiotics.  No blood in the stool.   Review of systems negative for any fevers, chills. BP has responded to IV fluid.  Past Medical History:  Diagnosis Date  . Anemia   . Arthritis    Osteoarthritis  . Chronic kidney disease    Chronic renal insufficiency, peritoneal dialysis qday sees dr Moshe Cipro  . Diabetes mellitus    Pre  . Diverticulosis   . GERD (gastroesophageal reflux disease)   . Gout   . Hiatal hernia   . History of blood transfusion   . Hyperlipidemia   . Hypertension   . Osteopenia   . Pancreatic cyst 1999  . Thyroid disease    Hyperparathyroid     Patient Active Problem List   Diagnosis Date Noted  . Esophagitis, [place] grade B 09/06/2019  . Gastritis 09/06/2019  .  Protein-calorie malnutrition, severe 08/29/2019  . Small bowel obstruction ([place]) 08/28/2019  . Schatzki's ring of distal esophagus 06/23/2019  . 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 History of colon polyps 06/23/2019  . Enrolled in chronic care management 02/20/2019  . Aortic atherosclerosis ([place]) 02/13/2018  . 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) 05/19/2016  . Overweight (BMI 25.0-29.9) 10/31/2015  . ESRD (end stage renal disease) ([place]) 10/31/2015  . OA (osteoarthritis) of hip 05/26/2015  . Secondary hyperparathyroidism (CKD) 04/16/2014  . Vitamin D deficiency 04/16/2014  . Hyperlipidemia, mixed 04/16/2014  . Medication management 04/16/2014  . Anemia of chronic Renal Dz   . Pancreatic cyst   . GERD   . Gout   . Osteopenia   . Essential hypertension 06/10/2013  . DJD 06/09/2013    Past Surgical History:  Procedure Laterality Date  . BIOPSY  04/25/2019   Procedure: BIOPSY;  Surgeon: Otis Brace, MD;  Location: [facility];  Service: Gastroenterology;;  . BIOPSY  09/02/2019   Procedure: BIOPSY;  Surgeon: Otis Brace, MD;  Location: [facility];  Service: Gastroenterology;;  . CHOLECYSTECTOMY    . COLONOSCOPY  03/21/12   Next one in 2018  . COLONOSCOPY WITH PROPOFOL N/A 04/25/2019   Procedure: COLONOSCOPY WITH PROPOFOL;  Surgeon: Otis Brace, MD;  Location: [facility];  Service: Gastroenterology;  Laterality: N/A;  . ESOPHAGOGASTRODUODENOSCOPY (EGD) WITH PROPOFOL N/A 04/25/2019  Procedure: ESOPHAGOGASTRODUODENOSCOPY (EGD) WITH PROPOFOL;  Surgeon: Otis Brace, MD;  Location: [facility];  Service: Gastroenterology;  Laterality: N/A;  . ESOPHAGOGASTRODUODENOSCOPY (EGD) WITH PROPOFOL N/A 09/02/2019   Procedure: ESOPHAGOGASTRODUODENOSCOPY (EGD) WITH PROPOFOL;  Surgeon: Otis Brace, MD;  Location: [facility];  Service: Gastroenterology;  Laterality: N/A;  . EYE SURGERY Left    cataract extraction with IOL  . INSERTION OF DIALYSIS CATHETER N/A 02/15/2017    Procedure: REVISION OF DIALYSIS CATHETER;  Surgeon: Algernon Huxley, MD;  Location: [facility];  Service: Cardiovascular;  Laterality: N/A;  . IR FLUORO GUIDE CV LINE RIGHT  08/29/2019  . IR US GUIDE VASC ACCESS RIGHT  08/29/2019  . JOINT REPLACEMENT  2012   left knee  . left knee replacement     . PANCREATIC CYST EXCISION  1999  . POLYPECTOMY  04/25/2019   Procedure: POLYPECTOMY;  Surgeon: Otis Brace, MD;  Location: [facility];  Service: Gastroenterology;;  . Clide Deutscher  04/25/2019   Procedure: SCLEROTHERAPY;  Surgeon: Otis Brace, MD;  Location: [facility];  Service: Gastroenterology;;  . TOTAL HIP ARTHROPLASTY Right 05/26/2015   Procedure: RIGHT TOTAL HIP ARTHROPLASTY ANTERIOR APPROACH;  Surgeon: Gaynelle Arabian, MD;  Location: [facility];  Service: Orthopedics;  Laterality: Right;  . TOTAL KNEE ARTHROPLASTY Right 06/09/2013   Procedure: RIGHT TOTAL KNEE ARTHROPLASTY;  Surgeon: Gearlean Alf, MD;  Location: [facility];  Service: Orthopedics;  Laterality: Right;     OB History   No obstetric history on file.      Home Medications    Prior to Admission medications   Medication Sig Start Date End Date Taking? Authorizing Provider  allopurinol (ZYLOPRIM) 300 MG tablet TAKE 1/2 TO 1 TABLET BY  MOUTH DAILY AS DIRECTED Patient taking differently: TAKE 1/2 TO 1 TABLET BY  MOUTH DAILY AS DIRECTED 10/30/18  Yes Liane Comber, NP  aspirin 81 MG tablet Take 81 mg by mouth at bedtime.    Yes [provider]  b complex-vitamin c-folic acid (NEPHRO-VITE) 0.8 MG TABS tablet Take 1 tablet by mouth at bedtime.   Yes [provider]  cinacalcet (SENSIPAR) 30 MG tablet Take 30 mg by mouth daily.    Yes [provider]  feeding supplement, ENSURE ENLIVE, (ENSURE ENLIVE) LIQD Take 237 mLs by mouth at bedtime. 09/06/19  Yes Rizwan, Eunice Blase, MD  Flaxseed, Linseed, (FLAXSEED OIL) 1000 MG CAPS Take 1 capsule by mouth daily.    Yes [provider]  Nutritional Supplements  (FEEDING SUPPLEMENT, NEPRO CARB STEADY,) LIQD Take 237 mLs by mouth 2 (two) times daily between meals. 09/06/19  Yes Debbe Odea, MD  Omega-3 Fatty Acids (FISH OIL) 1200 MG CAPS Take 1,200 mg by mouth daily.    Yes [provider]  ondansetron (ZOFRAN-ODT) 4 MG disintegrating tablet Take 4 mg by mouth every 8 (eight) hours as needed for nausea or vomiting.  08/25/19  Yes [provider]  pantoprazole (PROTONIX) 40 MG tablet Take 1 tablet (40 mg total) by mouth 2 (two) times daily. 04/25/19 04/24/20 Yes Brahmbhatt, Parag, MD  Potassium Chloride ER 20 MEQ TBCR Take 20 mEq by mouth daily.  08/26/19  Yes [provider]  rosuvastatin (CRESTOR) 5 MG tablet Take 1 tablet (5 mg total) by mouth daily after supper. 06/25/19  Yes Liane Comber, NP  sevelamer carbonate (RENVELA) 800 MG tablet Take 800 mg by mouth 3 (three) times daily with meals.    Yes [provider]  sucralfate (CARAFATE) 1 GM/10ML suspension Take 10 mLs (1  g total) by mouth 4 (four) times daily -  with meals and at bedtime for 10 days. 09/06/19 09/16/19 Yes Debbe Odea, MD  ZINC GLUCONATE PO Take 40 mg by mouth daily.    Yes [provider]    Family History Family History  Problem Relation Age of Onset  . Cancer Father        Prostate and Throat  . Hypertension Mother   . Cancer Mother        Colon with METS  . Diabetes Mother     Social History Social History   Tobacco Use  . Smoking status: Never Smoker  . Smokeless tobacco: Never Used  Substance Use Topics  . Alcohol use: No    Alcohol/week: 0.0 standard drinks  . Drug use: No     Allergies   Ace inhibitors and Nsaids   Review of Systems Review of Systems  Constitutional: Positive for activity change and fatigue.  Respiratory: Negative for shortness of breath.   Cardiovascular: Negative for chest pain.  Gastrointestinal: Positive for diarrhea, nausea and vomiting.  Allergic/Immunologic: Negative for  immunocompromised state.  Neurological: Positive for weakness.  All other systems reviewed and are negative.    Physical Exam Updated Vital Signs BP (!) 90/56   Pulse 89   Temp 97.6 F (36.4 C) (Oral)   Resp 16   Ht 5\' 1"  (1.549 m)   Wt 43.2 kg   SpO2 97%   BMI 17.99 kg/m   Physical Exam Vitals signs and nursing note reviewed.  Constitutional:      Appearance: She is well-developed.  HENT:     Head: Normocephalic and atraumatic.     Mouth/Throat:     Mouth: Mucous membranes are dry.  Neck:     Musculoskeletal: Normal range of motion and neck supple.  Cardiovascular:     Rate and Rhythm: Normal rate.  Pulmonary:     Effort: Pulmonary effort is normal.  Abdominal:     General: Bowel sounds are normal.     Tenderness: There is abdominal tenderness. There is no guarding or rebound.     Comments: Peritoneal dialysis catheter peers clean.  Skin:    General: Skin is warm and dry.  Neurological:     Mental Status: She is alert and oriented to person, place, and time.      ED Treatments / Results  Labs (all labs ordered are listed, but only abnormal results are displayed) Labs Reviewed  COMPREHENSIVE METABOLIC PANEL - Abnormal; Notable for the following components:      Result Value   Sodium 134 (*)    Chloride 92 (*)    CO2 18 (*)    Glucose, Bld 187 (*)    BUN 79 (*)    Creatinine, Ser 7.58 (*)    Total Protein 5.7 (*)    Albumin 2.1 (*)    AST 49 (*)    Total Bilirubin 1.6 (*)    GFR calc non Af Amer 5 (*)    GFR calc Af Amer 5 (*)    Anion gap 24 (*)    All other components within normal limits  CBC WITH DIFFERENTIAL/PLATELET - Abnormal; Notable for the following components:   WBC 12.2 (*)    RBC 3.32 (*)    Hemoglobin 10.4 (*)    HCT 32.0 (*)    RDW 17.0 (*)    nRBC 0.3 (*)    Neutro Abs 9.8 (*)    Monocytes Absolute 1.5 (*)  nRBC 1 (*)    All other components within normal limits  MAGNESIUM - Abnormal; Notable for the following components:    Magnesium 1.6 (*)    All other components within normal limits  PHOSPHORUS - Abnormal; Notable for the following components:   Phosphorus 6.0 (*)    All other components within normal limits  LACTIC ACID, PLASMA - Abnormal; Notable for the following components:   Lactic Acid, Venous 2.8 (*)    All other components within normal limits  LACTIC ACID, PLASMA    EKG EKG Interpretation  Date/Time:  Wednesday September 10 2019 14:39:34 EDT Ventricular Rate:  89 PR Interval:    QRS Duration: 96 QT Interval:  396 QTC Calculation: 482 R Axis:   -30 Text Interpretation:  Sinus rhythm Low voltage, extremity and precordial leads Consider anterior infarct ST elevation, consider inferior injury No acute changes Confirmed by Varney Biles [phone]) on 09/10/2019 5:12:31 PM   Radiology Dg Abd Acute 2+v W 1v Chest  Result Date: 09/10/2019 CLINICAL DATA:  Concern for small bowel obstruction EXAM: DG ABDOMEN ACUTE W/ 1V CHEST COMPARISON:  Abdominal radiograph dated 09/05/2019 FINDINGS: There is no evidence of dilated bowel loops or free intraperitoneal air. Degenerative changes are seen in the left hip and lumbar spine. A right hip arthroplasty is seen. A peritoneal dialysis catheter is noted in the pelvis. A calcified fibroid is redemonstrated. Heart size and mediastinal contours are within normal limits. Vascular calcifications are seen in the aortic arch. Both lungs are clear. IMPRESSION: No evidence of bowel obstruction or free intraperitoneal air. No acute cardiopulmonary findings. Aortic Atherosclerosis (ICD10-I70.0). Electronically Signed   By: Zerita Boers M.D.   On: 09/10/2019 17:20    Procedures Procedures (including critical care time)  Medications Ordered in ED Medications  sodium chloride 0.9 % bolus 500 mL (500 mLs Intravenous New Bag/Given 09/10/19 1559)  ondansetron (ZOFRAN) injection 4 mg (4 mg Intravenous Given 09/10/19 1600)     Initial Impression / Assessment and Plan / ED  Course  I have reviewed the triage vital signs and the nursing notes.  Pertinent labs & imaging results that were available during my care of the patient were reviewed by me and considered in my medical decision making (see chart for details).        78 year old comes in a chief complaint of weakness.  She has history of ESRD on peritoneal dialysis.  She was recently admitted to the hospital for small bowel obstruction.  Our evaluation reveals that she is dry.  She was hypotensive when paramedics arrived but her BP is improved with 100 cc of bolus.  It is unclear why she is having nausea, diarrhea.  Differential diagnosis does include small bowel obstruction versus ileus.  There also could have been hypotension because of hypovolemia secondary to her poor p.o. intake and vomiting, diarrhea.  Labs ordered.  Will reassess. Based on exam we do not think she has peritonitis.  Final Clinical Impressions(s) / ED Diagnoses   Final diagnoses:  None    ED Discharge Orders    None       Varney Biles, MD 09/10/19 1736

## 2019-09-10 NOTE — Progress Notes (Signed)
Called home number and Mrs Garner(daughter) cell phone this am to discuss results x2, no answer.  Discussed results with Daughter via telephone.  Patient condition not improved from appointment in office yesterday.  Continued emesis through the night and this am, fluid intake minimal, unable to assess blood pressure via home monitor.  Likely hypotensive related to dehydration and concern for bacterial involvement related to WBC elevated 21, doubled since hospital discharge.  Recommendations for daughter to contact EMS for transport to hospital for further evaluation.  Garnet Sierras, NP [facility] 09/10/2019  1:44 PM

## 2019-09-11 ENCOUNTER — Encounter: Payer: Self-pay | Admitting: Internal Medicine

## 2019-09-11 DIAGNOSIS — I120 Hypertensive chronic kidney disease with stage 5 chronic kidney disease or end stage renal disease: Secondary | ICD-10-CM | POA: Diagnosis present

## 2019-09-11 DIAGNOSIS — R197 Diarrhea, unspecified: Secondary | ICD-10-CM

## 2019-09-11 DIAGNOSIS — Z886 Allergy status to analgesic agent status: Secondary | ICD-10-CM

## 2019-09-11 DIAGNOSIS — I9589 Other hypotension: Secondary | ICD-10-CM

## 2019-09-11 DIAGNOSIS — Z888 Allergy status to other drugs, medicaments and biological substances status: Secondary | ICD-10-CM

## 2019-09-11 DIAGNOSIS — A498 Other bacterial infections of unspecified site: Secondary | ICD-10-CM | POA: Diagnosis not present

## 2019-09-11 DIAGNOSIS — E872 Acidosis: Secondary | ICD-10-CM | POA: Diagnosis present

## 2019-09-11 DIAGNOSIS — E785 Hyperlipidemia, unspecified: Secondary | ICD-10-CM | POA: Diagnosis present

## 2019-09-11 DIAGNOSIS — N186 End stage renal disease: Secondary | ICD-10-CM | POA: Diagnosis not present

## 2019-09-11 DIAGNOSIS — L89152 Pressure ulcer of sacral region, stage 2: Secondary | ICD-10-CM | POA: Diagnosis present

## 2019-09-11 DIAGNOSIS — K2100 Gastro-esophageal reflux disease with esophagitis, without bleeding: Secondary | ICD-10-CM | POA: Diagnosis present

## 2019-09-11 DIAGNOSIS — E1122 Type 2 diabetes mellitus with diabetic chronic kidney disease: Secondary | ICD-10-CM

## 2019-09-11 DIAGNOSIS — N2589 Other disorders resulting from impaired renal tubular function: Secondary | ICD-10-CM | POA: Diagnosis not present

## 2019-09-11 DIAGNOSIS — I959 Hypotension, unspecified: Secondary | ICD-10-CM | POA: Diagnosis not present

## 2019-09-11 DIAGNOSIS — K2970 Gastritis, unspecified, without bleeding: Secondary | ICD-10-CM

## 2019-09-11 DIAGNOSIS — E860 Dehydration: Secondary | ICD-10-CM

## 2019-09-11 DIAGNOSIS — N2581 Secondary hyperparathyroidism of renal origin: Secondary | ICD-10-CM | POA: Diagnosis not present

## 2019-09-11 DIAGNOSIS — E861 Hypovolemia: Secondary | ICD-10-CM | POA: Diagnosis present

## 2019-09-11 DIAGNOSIS — Z8589 Personal history of malignant neoplasm of other organs and systems: Secondary | ICD-10-CM

## 2019-09-11 DIAGNOSIS — M1990 Unspecified osteoarthritis, unspecified site: Secondary | ICD-10-CM | POA: Diagnosis present

## 2019-09-11 DIAGNOSIS — E876 Hypokalemia: Secondary | ICD-10-CM | POA: Diagnosis not present

## 2019-09-11 DIAGNOSIS — Z992 Dependence on renal dialysis: Secondary | ICD-10-CM | POA: Diagnosis not present

## 2019-09-11 DIAGNOSIS — A0472 Enterocolitis due to Clostridium difficile, not specified as recurrent: Secondary | ICD-10-CM | POA: Diagnosis not present

## 2019-09-11 DIAGNOSIS — Z23 Encounter for immunization: Secondary | ICD-10-CM | POA: Diagnosis not present

## 2019-09-11 DIAGNOSIS — Z4932 Encounter for adequacy testing for peritoneal dialysis: Secondary | ICD-10-CM | POA: Diagnosis not present

## 2019-09-11 DIAGNOSIS — D509 Iron deficiency anemia, unspecified: Secondary | ICD-10-CM | POA: Diagnosis not present

## 2019-09-11 DIAGNOSIS — Z20828 Contact with and (suspected) exposure to other viral communicable diseases: Secondary | ICD-10-CM | POA: Diagnosis present

## 2019-09-11 DIAGNOSIS — M8580 Other specified disorders of bone density and structure, unspecified site: Secondary | ICD-10-CM | POA: Diagnosis present

## 2019-09-11 DIAGNOSIS — K5790 Diverticulosis of intestine, part unspecified, without perforation or abscess without bleeding: Secondary | ICD-10-CM | POA: Diagnosis present

## 2019-09-11 DIAGNOSIS — D631 Anemia in chronic kidney disease: Secondary | ICD-10-CM | POA: Diagnosis not present

## 2019-09-11 DIAGNOSIS — D3A092 Benign carcinoid tumor of the stomach: Secondary | ICD-10-CM | POA: Diagnosis present

## 2019-09-11 DIAGNOSIS — K208 Other esophagitis without bleeding: Secondary | ICD-10-CM | POA: Diagnosis not present

## 2019-09-11 DIAGNOSIS — R112 Nausea with vomiting, unspecified: Secondary | ICD-10-CM

## 2019-09-11 DIAGNOSIS — K2900 Acute gastritis without bleeding: Secondary | ICD-10-CM | POA: Diagnosis not present

## 2019-09-11 DIAGNOSIS — L8990 Pressure ulcer of unspecified site, unspecified stage: Secondary | ICD-10-CM | POA: Diagnosis present

## 2019-09-11 DIAGNOSIS — Z681 Body mass index (BMI) 19 or less, adult: Secondary | ICD-10-CM | POA: Diagnosis not present

## 2019-09-11 DIAGNOSIS — E43 Unspecified severe protein-calorie malnutrition: Secondary | ICD-10-CM | POA: Diagnosis present

## 2019-09-11 DIAGNOSIS — E1151 Type 2 diabetes mellitus with diabetic peripheral angiopathy without gangrene: Secondary | ICD-10-CM | POA: Diagnosis present

## 2019-09-11 DIAGNOSIS — R82998 Other abnormal findings in urine: Secondary | ICD-10-CM | POA: Diagnosis not present

## 2019-09-11 DIAGNOSIS — M109 Gout, unspecified: Secondary | ICD-10-CM | POA: Diagnosis present

## 2019-09-11 DIAGNOSIS — E119 Type 2 diabetes mellitus without complications: Secondary | ICD-10-CM | POA: Diagnosis not present

## 2019-09-11 HISTORY — DX: Diarrhea, unspecified: R19.7

## 2019-09-11 LAB — BASIC METABOLIC PANEL
Anion gap: 21 — ABNORMAL HIGH (ref 5–15)
BUN: 91 mg/dL — ABNORMAL HIGH (ref 8–23)
CO2: 19 mmol/L — ABNORMAL LOW (ref 22–32)
Calcium: 9.2 mg/dL (ref 8.9–10.3)
Chloride: 97 mmol/L — ABNORMAL LOW (ref 98–111)
Creatinine, Ser: 7.81 mg/dL — ABNORMAL HIGH (ref 0.44–1.00)
GFR calc Af Amer: 5 mL/min — ABNORMAL LOW (ref >60)
GFR calc non Af Amer: 4 mL/min — ABNORMAL LOW (ref >60)
Glucose, Bld: 99 mg/dL (ref 70–99)
Potassium: 3.4 mmol/L — ABNORMAL LOW (ref 3.5–5.1)
Sodium: 137 mmol/L (ref 135–145)

## 2019-09-11 LAB — CBC
HCT: 33 % — ABNORMAL LOW (ref 36.0–46.0)
Hemoglobin: 10.6 g/dL — ABNORMAL LOW (ref 12.0–15.0)
MCH: 30.5 pg (ref 26.0–34.0)
MCHC: 32.1 g/dL (ref 30.0–36.0)
MCV: 94.8 fL (ref 80.0–100.0)
Platelets: 287 10*3/uL (ref 150–400)
RBC: 3.48 MIL/uL — ABNORMAL LOW (ref 3.87–5.11)
RDW: 16.7 % — ABNORMAL HIGH (ref 11.5–15.5)
WBC: 11.6 10*3/uL — ABNORMAL HIGH (ref 4.0–10.5)
nRBC: 0.4 % — ABNORMAL HIGH (ref 0.0–0.2)

## 2019-09-11 LAB — COMPREHENSIVE METABOLIC PANEL
ALT: 14 U/L (ref 0–44)
AST: 16 U/L (ref 15–41)
Albumin: 1.9 g/dL — ABNORMAL LOW (ref 3.5–5.0)
Alkaline Phosphatase: 93 U/L (ref 38–126)
Anion gap: 22 — ABNORMAL HIGH (ref 5–15)
BUN: 86 mg/dL — ABNORMAL HIGH (ref 8–23)
CO2: 18 mmol/L — ABNORMAL LOW (ref 22–32)
Calcium: 9.2 mg/dL (ref 8.9–10.3)
Chloride: 97 mmol/L — ABNORMAL LOW (ref 98–111)
Creatinine, Ser: 7.45 mg/dL — ABNORMAL HIGH (ref 0.44–1.00)
GFR calc Af Amer: 5 mL/min — ABNORMAL LOW (ref >60)
GFR calc non Af Amer: 5 mL/min — ABNORMAL LOW (ref >60)
Glucose, Bld: 114 mg/dL — ABNORMAL HIGH (ref 70–99)
Potassium: 3 mmol/L — ABNORMAL LOW (ref 3.5–5.1)
Sodium: 137 mmol/L (ref 135–145)
Total Bilirubin: 0.9 mg/dL (ref 0.3–1.2)
Total Protein: 5.6 g/dL — ABNORMAL LOW (ref 6.5–8.1)

## 2019-09-11 LAB — GLUCOSE, CAPILLARY
Glucose-Capillary: 111 mg/dL — ABNORMAL HIGH (ref 70–99)
Glucose-Capillary: 112 mg/dL — ABNORMAL HIGH (ref 70–99)
Glucose-Capillary: 102 mg/dL — ABNORMAL HIGH (ref 70–99)
Glucose-Capillary: 93 mg/dL (ref 70–99)
Glucose-Capillary: 124 mg/dL — ABNORMAL HIGH (ref 70–99)

## 2019-09-11 LAB — C DIFFICILE QUICK SCREEN W PCR REFLEX
C Diff antigen: POSITIVE — AB
C Diff interpretation: DETECTED
C Diff toxin: POSITIVE — AB

## 2019-09-11 LAB — LACTIC ACID, PLASMA
Lactic Acid, Venous: 3 mmol/L (ref 0.5–1.9)
Lactic Acid, Venous: 1.3 mmol/L (ref 0.5–1.9)

## 2019-09-11 LAB — PHOSPHORUS: Phosphorus: 5.9 mg/dL — ABNORMAL HIGH (ref 2.5–4.6)

## 2019-09-11 LAB — SARS CORONAVIRUS 2 (TAT 6-24 HRS): SARS Coronavirus 2: NEGATIVE

## 2019-09-11 LAB — MAGNESIUM: Magnesium: 1.7 mg/dL (ref 1.7–2.4)

## 2019-09-11 MED ORDER — VANCOMYCIN 50 MG/ML ORAL SOLUTION
500.0000 mg | Freq: Four times a day (QID) | ORAL | Status: DC
  Administered 2019-09-11 – 2019-09-13 (×10): 500 mg via ORAL
  Filled 2019-09-11 (×11): qty 10

## 2019-09-11 MED ORDER — METRONIDAZOLE IN NACL 5-0.79 MG/ML-% IV SOLN
500.0000 mg | Freq: Three times a day (TID) | INTRAVENOUS | Status: DC
  Administered 2019-09-11 – 2019-09-12 (×4): 500 mg via INTRAVENOUS
  Filled 2019-09-11 (×4): qty 100

## 2019-09-11 MED ORDER — NEPRO/CARBSTEADY PO LIQD
237.0000 mL | Freq: Every day | ORAL | Status: DC
  Administered 2019-09-12: 237 mL via ORAL

## 2019-09-11 MED ORDER — GENTAMICIN SULFATE 0.1 % EX CREA
1.0000 "application " | TOPICAL_CREAM | Freq: Every day | CUTANEOUS | Status: DC
  Administered 2019-09-11 – 2019-09-13 (×3): 1 via TOPICAL
  Filled 2019-09-11: qty 15

## 2019-09-11 MED ORDER — POTASSIUM CHLORIDE CRYS ER 20 MEQ PO TBCR
30.0000 meq | EXTENDED_RELEASE_TABLET | Freq: Once | ORAL | Status: AC
  Administered 2019-09-11: 30 meq via ORAL
  Filled 2019-09-11: qty 1

## 2019-09-11 MED ORDER — BOOST / RESOURCE BREEZE PO LIQD CUSTOM
1.0000 | Freq: Two times a day (BID) | ORAL | Status: DC
  Administered 2019-09-11 – 2019-09-12 (×3): 1 via ORAL

## 2019-09-11 MED ORDER — SODIUM CHLORIDE 0.9 % IV BOLUS
250.0000 mL | Freq: Once | INTRAVENOUS | Status: AC
  Administered 2019-09-11: 250 mL via INTRAVENOUS

## 2019-09-11 MED ORDER — PRO-STAT SUGAR FREE PO LIQD
30.0000 mL | Freq: Three times a day (TID) | ORAL | Status: DC
  Administered 2019-09-11 – 2019-09-14 (×9): 30 mL via ORAL
  Filled 2019-09-11 (×10): qty 30

## 2019-09-11 MED ORDER — SODIUM CHLORIDE 0.9 % IV SOLN
INTRAVENOUS | Status: DC
  Administered 2019-09-11 – 2019-09-12 (×2): via INTRAVENOUS

## 2019-09-11 MED ORDER — LOPERAMIDE HCL 2 MG PO CAPS: 2.0000 mg | ORAL_CAPSULE | ORAL | Status: DC | PRN

## 2019-09-11 MED ORDER — HEPARIN 1000 UNIT/ML FOR PERITONEAL DIALYSIS
INTRAPERITONEAL | Status: DC | PRN
  Filled 2019-09-11: qty 3000

## 2019-09-11 MED ORDER — HEPARIN 1000 UNIT/ML FOR PERITONEAL DIALYSIS: 500.0000 [IU] | INTRAMUSCULAR | Status: DC | PRN

## 2019-09-11 MED ORDER — DELFLEX-LC/1.5% DEXTROSE 344 MOSM/L IP SOLN
INTRAPERITONEAL | Status: DC
  Administered 2019-09-12 – 2019-09-13 (×2): 5000 mL via INTRAPERITONEAL

## 2019-09-11 NOTE — Progress Notes (Signed)
Initial Nutrition Assessment  DOCUMENTATION CODES:   Severe malnutrition in context of chronic illness  INTERVENTION:   -Downgrade diet to dysphagia 2 (mechanical soft) for ease of intake -Continue renal MVI daily -30 ml Prostat TID with meals, each supplement provides 100 kcals and 15 grams protein -Boost Breeze po BID, each supplement provides 250 kcal and 9 grams of protein -Decrease Nepro Shake po daily, each supplement provides 425 kcal and 19 grams protein  NUTRITION DIAGNOSIS:   Severe Malnutrition related to chronic illness(ESRD on PD) as evidenced by energy intake < or equal to 75% for > or equal to 1 month, percent weight loss.  GOAL:   Patient will meet greater than or equal to 90% of their needs  MONITOR:   PO intake, Supplement acceptance, Labs, Weight trends, Skin, I & O's  REASON FOR ASSESSMENT:   Malnutrition Screening Tool    ASSESSMENT:   Eileen Perez is a 78 y.o. 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  History provided by patient and daughter at bedside.  Patient's blood pressure has been low since her recent hospital discharge.  She has not been taking her blood pressure medications.  She does peritoneal dialysis at home.  She has continued to vomit since her hospital discharge.  She has also been having multiple episodes of watery, nonbloody diarrhea since her hospital discharge.  No recent antibiotic use.  Denies fevers or abdominal pain.  Pt admitted with hypotension related to volume depletion from PD and ongoing vomiting and diarrhea.   10/7- last hospital discharge  Reviewed I/O's: +1.6 L x 24 hours  Pt on enteric precautions. In an effort to  preserve PPE, RD did not enter pt room.   Spoke with pt over the phone, who reports feeling discomfort related to buttocks pain and diarrhea. She reports that she consumed some oatmeal for breakfast.   Pt familiar to this RD from previous hospitalization. Pt has had inadequate intake over the past 4-5 weeks. Per last hospitalization, intake PTA consisted of Ensure and fruit juices. Intake remained minimal during hospitalization; pt consumed mainly soups and oral nutrition supplements. She reports that since hospital discharge, she has been consuming only about 1/2 of a container of Nepro daily, as well as bites of soft fruit, puddings, jello, and oatmeal. She consumed a little bit of Nepro this morning, but is afraid it exacerbated her diarrhea.   Pt remains with ongoing weight loss, likely due to continued poor oral intake. Pt has experienced a 25.8% wt loss over the past 3 months, which is significant for time frame. Pt also with stage II pressure injury to coccyx, which was not present during last hospital admission, which also supports continued decline of nutritional status.   Discussed with pt supplement options to help assist with improved nutritional intake. She is amenable to Prostat and Boost Breeze supplements, in addition to diet downgrade for ease of intake.   Labs reviewed: K: 3.0, CBGS: 111 (inpatient orders for glycemic control are 0-9 units insulin aspart TID with meals).   Diet Order:   Diet Order            DIET DYS 2 Room service appropriate? Yes; Fluid consistency: Thin  Diet effective now  EDUCATION NEEDS:   Education needs have been addressed  Skin:  Skin Assessment: Skin Integrity Issues: Skin Integrity Issues:: Stage II Stage II: coccyx  Last BM:  09/11/19  Height:   Ht Readings from Last 1 Encounters:  09/10/19 5\' 1"  (1.549 m)    Weight:   Wt Readings from Last 1 Encounters:  09/11/19 47.2 kg    Ideal Body Weight:  47.7 kg  BMI:   Body mass index is 19.67 kg/m.  Estimated Nutritional Needs:   Kcal:  1700-1900  Protein:  90-105 grams  Fluid:  1.7-1.9 L    Alyson Ki A. Jimmye Norman, RD, LDN, [place] Registered Dietitian II Certified Diabetes Care and Education Specialist Pager: [phone] After hours Pager: [phone]

## 2019-09-11 NOTE — Consult Note (Signed)
[facility]  Total days of antibiotics 1               Reason for Consult: severe clostridium difficile   Referring Physician: griffith  Principal Problem:   Hypotension Active Problems:   ESRD (end stage renal disease) ([place])   Intractable nausea and vomiting   Diarrhea   Hypomagnesemia   Pressure injury of skin   Clostridioides difficile infection   Dehydration    HPI: Eileen Perez is a 78 y.o. female with ESRD, with hx of carcinoid tumor, DM2, esophagits-gastritis who recent was admitted for partial small bowel obstruction but had been discharged on 10/17, she is now readmitted for profuse watery diarhea with abdominal cramping, fatigue, poor appetite. On admit found to have leukocytosis of 21.8K, and stool studies positive for PCR. Vitals significant for hypotension. She responded to ivfluids, and initiation of high dose oral vancomycin and iv metronidazole. She remains afebrile. Difficult to tell if worsening AKI since she is ESRD. Hypotension resolved  Past Medical History:  Diagnosis Date  . Anemia   . Arthritis    Osteoarthritis  . Chronic kidney disease    Chronic renal insufficiency, peritoneal dialysis qday sees dr Moshe Cipro  . Diabetes mellitus    Pre  . Diverticulosis   . GERD (gastroesophageal reflux disease)   . Gout   . Hiatal hernia   . History of blood transfusion   . Hyperlipidemia   . Hypertension   . Osteopenia   . Pancreatic cyst 1999  . Thyroid disease    Hyperparathyroid     Allergies:  Allergies  Allergen Reactions  . Ace Inhibitors Other (See Comments)    unknown  . Nsaids Other (See Comments)    unknown    MEDICATIONS: . allopurinol  300 mg Oral QODAY  . aspirin  81 mg Oral QHS  . cinacalcet  30 mg Oral Q breakfast  . feeding supplement  1 Container Oral BID BM  . [START ON 09/12/2019] feeding supplement (NEPRO CARB STEADY)  237 mL Oral QHS  . feeding supplement (PRO-STAT SUGAR FREE 64)  30 mL Oral  TID WC  . gentamicin cream  1 application Topical Daily  . heparin  5,000 Units Subcutaneous Q8H  . insulin aspart  0-9 Units Subcutaneous TID WC  . multivitamin  1 tablet Oral QHS  . pantoprazole (PROTONIX) IV  40 mg Intravenous Q12H  . rosuvastatin  5 mg Oral QPC supper  . sevelamer carbonate  800 mg Oral TID WC  . sucralfate  1 g Oral TID WC & HS  . vancomycin  500 mg Oral Q6H    Social History   Tobacco Use  . Smoking status: Never Smoker  . Smokeless tobacco: Never Used  Substance Use Topics  . Alcohol use: No    Alcohol/week: 0.0 standard drinks  . Drug use: No    Family History  Problem Relation Age of Onset  . Cancer Father        Prostate and Throat  . Hypertension Mother   . Cancer Mother        Colon with METS  . Diabetes Mother     Review of Systems  Constitutional: Negative for fever, chills, diaphoresis, activity change, appetite change, fatigue and unexpected weight change.  HENT: Negative for congestion, sore throat, rhinorrhea, sneezing, trouble swallowing and sinus pressure.  Eyes: Negative for photophobia and visual disturbance.  Respiratory: Negative for cough, chest tightness, shortness of breath, wheezing and  stridor.  Cardiovascular: Negative for chest pain, palpitations and leg swelling.  Gastrointestinal: Negative for +nausea but no vomiting, + abdominal pain, +diarrhea,  No constipation,no  blood in stool, no abdominal distention and no anal bleeding.  Genitourinary: Negative for dysuria, hematuria, flank pain and difficulty urinating.  Musculoskeletal: Negative for myalgias, back pain, joint swelling, arthralgias and gait problem.  Skin: Negative for color change, pallor, rash and wound.  Neurological: Negative for dizziness, tremors, weakness and light-headedness.  Hematological: Negative for adenopathy. Does not bruise/bleed easily.  Psychiatric/Behavioral: Negative for behavioral problems, confusion, sleep disturbance, dysphoric mood,  decreased concentration and agitation.      OBJECTIVE: Temp:  [97.3 F (36.3 C)-98.6 F (37 C)] 97.8 F (36.6 C) (10/22 1753) Pulse Rate:  [77-89] 87 (10/22 1753) Resp:  [14-25] 18 (10/22 1753) BP: (84-122)/(42-75) 100/57 (10/22 1753) SpO2:  [87 %-100 %] 100 % (10/22 1753) Weight:  [47.2 kg-51.3 kg] 51.3 kg (10/22 1753) Physical Exam  Constitutional:  oriented to person, place, and time. appears well-developed and well-nourished. No distress.  HENT: [place]/AT, PERRLA, no scleral icterus Mouth/Throat: Oropharynx is clear and moist. No oropharyngeal exudate.  Cardiovascular: Normal rate, regular rhythm and normal heart sounds. Exam reveals no gallop and no friction rub.  No murmur heard.  Pulmonary/Chest: Effort normal and breath sounds normal. No respiratory distress.  has no wheezes.  Neck = supple, no nuchal rigidity Abdominal: Soft. Bowel sounds are slow.  Mild distension. There is no tenderness.  Lymphadenopathy: no cervical adenopathy. No axillary adenopathy Neurological: alert and oriented to person, place, and time.  Skin: Skin is warm and dry. No rash noted. No erythema.  Psychiatric: a normal mood and affect.  behavior is normal.    LABS: Results for orders placed or performed during the hospital encounter of 09/10/19 (from the past 48 hour(s))  Comprehensive metabolic panel     Status: Abnormal   Collection Time: 09/10/19  3:19 PM  Result Value Ref Range   Sodium 134 (L) 135 - 145 mmol/L   Potassium 4.2 3.5 - 5.1 mmol/L    Comment: HEMOLYSIS AT THIS LEVEL MAY AFFECT RESULT   Chloride 92 (L) 98 - 111 mmol/L   CO2 18 (L) 22 - 32 mmol/L   Glucose, Bld 187 (H) 70 - 99 mg/dL   BUN 79 (H) 8 - 23 mg/dL   Creatinine, Ser 7.58 (H) 0.44 - 1.00 mg/dL   Calcium 9.2 8.9 - 10.3 mg/dL   Total Protein 5.7 (L) 6.5 - 8.1 g/dL   Albumin 2.1 (L) 3.5 - 5.0 g/dL   AST 49 (H) 15 - 41 U/L   ALT 19 0 - 44 U/L   Alkaline Phosphatase 99 38 - 126 U/L   Total Bilirubin 1.6 (H) 0.3 - 1.2  mg/dL   GFR calc non Af Amer 5 (L) >60 mL/min   GFR calc Af Amer 5 (L) >60 mL/min   Anion gap 24 (H) 5 - 15    Comment: Performed at [facility], [address], [place], [place] [address]  CBC with Differential     Status: Abnormal   Collection Time: 09/10/19  3:19 PM  Result Value Ref Range   WBC 12.2 (H) 4.0 - 10.5 K/uL   RBC 3.32 (L) 3.87 - 5.11 MIL/uL   Hemoglobin 10.4 (L) 12.0 - 15.0 g/dL   HCT 32.0 (L) 36.0 - 46.0 %   MCV 96.4 80.0 - 100.0 fL   MCH 31.3 26.0 - 34.0 pg   MCHC  32.5 30.0 - 36.0 g/dL   RDW 17.0 (H) 11.5 - 15.5 %   Platelets 308 150 - 400 K/uL   nRBC 0.3 (H) 0.0 - 0.2 %   Neutrophils Relative % 80 %   Neutro Abs 9.8 (H) 1.7 - 7.7 K/uL   Lymphocytes Relative 8 %   Lymphs Abs 1.0 0.7 - 4.0 K/uL   Monocytes Relative 12 %   Monocytes Absolute 1.5 (H) 0.1 - 1.0 K/uL   Eosinophils Relative 0 %   Eosinophils Absolute 0.0 0.0 - 0.5 K/uL   Basophils Relative 0 %   Basophils Absolute 0.0 0.0 - 0.1 K/uL   nRBC 1 (H) 0 /100 WBC   Abs Immature Granulocytes 0.00 0.00 - 0.07 K/uL   Tear Drop Cells PRESENT    Polychromasia PRESENT     Comment: Performed at [facility], [place] [address], [place], [place] [address]  Magnesium     Status: Abnormal   Collection Time: 09/10/19  3:19 PM  Result Value Ref Range   Magnesium 1.6 (L) 1.7 - 2.4 mg/dL    Comment: Performed at [facility] [address], [place], [place] [address]  Phosphorus     Status: Abnormal   Collection Time: 09/10/19  3:19 PM  Result Value Ref Range   Phosphorus 6.0 (H) 2.5 - 4.6 mg/dL    Comment: Performed at [facility] [address] [place], [place] [address]  Lactic acid, plasma     Status: Abnormal   Collection Time: 09/10/19  3:19 PM  Result Value Ref Range   Lactic Acid, Venous 2.8 (HH) 0.5 - 1.9 mmol/L    Comment: CRITICAL RESULT CALLED TO, READ BACK BY AND VERIFIED WITH: B HONEYCUTT,RN 1614 09/10/2019 WBOND Performed at [facility], [place] [address],  [place], [place] [address]   Lactic acid, plasma     Status: Abnormal   Collection Time: 09/10/19  5:19 PM  Result Value Ref Range   Lactic Acid, Venous 2.9 (HH) 0.5 - 1.9 mmol/L    Comment: CRITICAL VALUE NOTED.  VALUE IS CONSISTENT WITH PREVIOUSLY REPORTED AND CALLED VALUE. Performed at [facility], [place] [address], [place], [place] [address]   C difficile quick scan w PCR reflex     Status: Abnormal   Collection Time: 09/10/19 10:05 PM   Specimen: STOOL  Result Value Ref Range   C Diff antigen POSITIVE (A) NEGATIVE    Comment: CRITICAL RESULT CALLED TO, READ BACK BY AND VERIFIED WITH: K GILMORE RN 09/11/19 0112 JDW    C Diff toxin POSITIVE (A) NEGATIVE   C Diff interpretation Toxin producing C. difficile detected.     Comment: Performed at [facility], [place] [address], [place], [place] [address]  CBG monitoring, ED     Status: Abnormal   Collection Time: 09/10/19 11:04 PM  Result Value Ref Range   Glucose-Capillary 143 (H) 70 - 99 mg/dL  SARS CORONAVIRUS 2 (TAT 6-24 HRS) Nasopharyngeal Nasopharyngeal Swab     Status: None   Collection Time: 09/11/19 12:25 AM   Specimen: Nasopharyngeal Swab  Result Value Ref Range   SARS Coronavirus 2 NEGATIVE NEGATIVE    Comment: (NOTE) SARS-CoV-2 target nucleic acids are NOT DETECTED. The SARS-CoV-2 RNA is generally detectable in upper and lower respiratory specimens during the acute phase of infection. Negative results do not preclude SARS-CoV-2 infection, do not rule out co-infections with other pathogens, and should not be used as  the sole basis for treatment or other patient management decisions. Negative results must be combined with clinical observations, patient history, and epidemiological information. The expected result is Negative. Fact Sheet for Patients: SugarRoll.be Fact Sheet for Healthcare Providers: https://www.woods-mathews.com/ This test is not yet approved or cleared by  the [place] FDA and  has been authorized for detection and/or diagnosis of SARS-CoV-2 by FDA under an Emergency Use Authorization (EUA). This EUA will remain  in effect (meaning this test can be used) for the duration of the COVID-19 declaration under Section 56 4(b)(1) of the Act, 21 U.S.C. section 360bbb-3(b)(1), unless the authorization is terminated or revoked sooner. Performed at [facility], [place] [address] [place], [place] [address]   CBC     Status: Abnormal   Collection Time: 09/11/19  4:06 AM  Result Value Ref Range   WBC 11.6 (H) 4.0 - 10.5 K/uL   RBC 3.48 (L) 3.87 - 5.11 MIL/uL   Hemoglobin 10.6 (L) 12.0 - 15.0 g/dL   HCT 33.0 (L) 36.0 - 46.0 %   MCV 94.8 80.0 - 100.0 fL   MCH 30.5 26.0 - 34.0 pg   MCHC 32.1 30.0 - 36.0 g/dL   RDW 16.7 (H) 11.5 - 15.5 %   Platelets 287 150 - 400 K/uL   nRBC 0.4 (H) 0.0 - 0.2 %    Comment: Performed at [facility], [place] [address] [place], [place] [address]  Comprehensive metabolic panel     Status: Abnormal   Collection Time: 09/11/19  4:06 AM  Result Value Ref Range   Sodium 137 135 - 145 mmol/L   Potassium 3.0 (L) 3.5 - 5.1 mmol/L   Chloride 97 (L) 98 - 111 mmol/L   CO2 18 (L) 22 - 32 mmol/L   Glucose, Bld 114 (H) 70 - 99 mg/dL   BUN 86 (H) 8 - 23 mg/dL   Creatinine, Ser 7.45 (H) 0.44 - 1.00 mg/dL   Calcium 9.2 8.9 - 10.3 mg/dL   Total Protein 5.6 (L) 6.5 - 8.1 g/dL   Albumin 1.9 (L) 3.5 - 5.0 g/dL   AST 16 15 - 41 U/L   ALT 14 0 - 44 U/L   Alkaline Phosphatase 93 38 - 126 U/L   Total Bilirubin 0.9 0.3 - 1.2 mg/dL   GFR calc non Af Amer 5 (L) >60 mL/min   GFR calc Af Amer 5 (L) >60 mL/min   Anion gap 22 (H) 5 - 15    Comment: Performed at [facility], [place] [address] [place], [place] [address]  Lactic acid, plasma     Status: Abnormal   Collection Time: 09/11/19  4:06 AM  Result Value Ref Range   Lactic Acid, Venous 3.0 (HH) 0.5 - 1.9 mmol/L    Comment: CRITICAL VALUE NOTED.  VALUE IS  CONSISTENT WITH PREVIOUSLY REPORTED AND CALLED VALUE. Performed at [facility], [place] [address] [place], [place] [address]   Glucose, capillary     Status: Abnormal   Collection Time: 09/11/19  5:40 AM  Result Value Ref Range   Glucose-Capillary 111 (H) 70 - 99 mg/dL  Glucose, capillary     Status: Abnormal   Collection Time: 09/11/19  6:26 AM  Result Value Ref Range   Glucose-Capillary 112 (H) 70 - 99 mg/dL  Glucose, capillary     Status: Abnormal   Collection Time: 09/11/19 11:57 AM  Result Value Ref Range   Glucose-Capillary 102 (H) 70 -  99 mg/dL  Basic metabolic panel     Status: Abnormal   Collection Time: 09/11/19  4:11 PM  Result Value Ref Range   Sodium 137 135 - 145 mmol/L   Potassium 3.4 (L) 3.5 - 5.1 mmol/L   Chloride 97 (L) 98 - 111 mmol/L   CO2 19 (L) 22 - 32 mmol/L   Glucose, Bld 99 70 - 99 mg/dL   BUN 91 (H) 8 - 23 mg/dL   Creatinine, Ser 7.81 (H) 0.44 - 1.00 mg/dL   Calcium 9.2 8.9 - 10.3 mg/dL   GFR calc non Af Amer 4 (L) >60 mL/min   GFR calc Af Amer 5 (L) >60 mL/min   Anion gap 21 (H) 5 - 15    Comment: Performed at [facility] [address] [place], [place] [address]  Magnesium     Status: None   Collection Time: 09/11/19  4:11 PM  Result Value Ref Range   Magnesium 1.7 1.7 - 2.4 mg/dL    Comment: Performed at [facility] [address], [place], [place] [address]  Phosphorus     Status: Abnormal   Collection Time: 09/11/19  4:11 PM  Result Value Ref Range   Phosphorus 5.9 (H) 2.5 - 4.6 mg/dL    Comment: Performed at [facility] [address], [place], [place] [address]  Lactic acid, plasma     Status: None   Collection Time: 09/11/19  4:25 PM  Result Value Ref Range   Lactic Acid, Venous 1.3 0.5 - 1.9 mmol/L    Comment: Performed at [facility] [address] [place], [place] [address]  Glucose, capillary     Status: None   Collection Time: 09/11/19  4:58 PM  Result Value Ref Range   Glucose-Capillary 93  70 - 99 mg/dL    MICRO: reviewed IMAGING: Dg Abd Acute 2+v W 1v Chest  Result Date: 09/10/2019 CLINICAL DATA:  Concern for small bowel obstruction EXAM: DG ABDOMEN ACUTE W/ 1V CHEST COMPARISON:  Abdominal radiograph dated 09/05/2019 FINDINGS: There is no evidence of dilated bowel loops or free intraperitoneal air. Degenerative changes are seen in the left hip and lumbar spine. A right hip arthroplasty is seen. A peritoneal dialysis catheter is noted in the pelvis. A calcified fibroid is redemonstrated. Heart size and mediastinal contours are within normal limits. Vascular calcifications are seen in the aortic arch. Both lungs are clear. IMPRESSION: No evidence of bowel obstruction or free intraperitoneal air. No acute cardiopulmonary findings. Aortic Atherosclerosis (ICD10-I70.0). Electronically Signed   By: Zerita Boers M.D.   On: 09/10/2019 17:20    Assessment/Plan:  78yo F with ESRD, with hx of carcinoid tumor, DM2, esophagits-gastritis who recent was admitted for partial small bowel obstruction but now found to have cdifficile without recent exposure to antibiotics - continue with high dose oral vancomycin plus iv metronidazole - if continues to improve, would stop iv metronidazole tomorrow - continue with symptoms management of nausea - other risk factors includes ppi.  ESRD = defer to renal for management of fluid status.  [facility] [phone]

## 2019-09-11 NOTE — Progress Notes (Signed)
PROGRESS NOTE    Leslie Jester  ALP:[id number] DOB: July 07, 1941 DOA: 09/10/2019 PCP: Unk Pinto, MD    Brief Narrative:  78 y.o. 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  History provided by patient and daughter at bedside.  Patient's blood pressure has been low since her recent hospital discharge.  She has not been taking her blood pressure medications. She has continued to vomit since her hospital discharge.  She has also been having multiple episodes of watery, nonbloody diarrhea since her hospital discharge.  No recent antibiotic use.  Denies fevers or abdominal pain. In the ED, blood pressure 60/40 per EMS and improved to 90/50 after 500 cc normal saline bolus.  Not tachycardic or febrile.  White blood cell count 12.2, significantly improved compared to labs done yesterday.  Lactic acid 2.8 >2.9.  Blood culture x2 pending.  Hemoglobin 10.4, recently ranging from 10.9-11.6.  T bili 1.6, AST 49.  ALT and alk phos normal.  Magnesium 1.6, repleted.  C. difficile positive.  GI pathogen panel pending.  Abdominal x-ray showing no evidence of bowel obstruction or free intraperitoneal air. Chest x-ray showing no acute cardiopulmonary disease. Patient received Zofran and a 500 cc normal saline bolus.  Patient was admitted to telemetry, and started on therapy for fulminant C. difficile infection given her hypotension.  Infectious disease to follow.  Blood pressure has improved with IV fluids but still on the soft side.  Subjective 10/22: Patient seen and examined awake laying in bed.  Continues to report some nausea, but no vomiting.  Continues with frequent watery diarrhea.  States she does not feel  like eating solid foods such as meats.  Reports feeling better than when she arrived.  Denies fevers or chills, chest pain, shortness of breath.  Assessment & Plan:   Principal Problem:   Hypotension Active Problems:   Hypomagnesemia   ESRD (end stage renal disease) (HCC)   Intractable nausea and vomiting   Diarrhea   Pressure injury of skin  Meets criteria for inpatient admission given fulminant C. difficile infection with associated hypotension, requiring ongoing hospital care for more than 2 midnights.  Fulminant C. difficile infection -C. difficile antigen and toxin returned positive.  Given hypotension thisis considered fulminant CDI. - Infectious disease to follow - C. difficile order set utilized - Started oral vancomycin 500 mg q6h,, IV Flagyl 500 mg q8h  Hypotension -improving.  Secondary to hypovolemia from ongoing diarrhea and vomiting, possibly peritoneal dialysis as well.  Doubt sepsis given no fever or tachycardia.  Leukocytosis significantly improved 11.6 today from 21.82 days ago.  Lactate elevated at 2.8, 2.9 likely secondary to hypovolemia.  Doubt peritonitis given no abdominal pain or fever.  No evidence of acute blood loss.  Blood pressure improved with IV fluid boluses and continued maintenance fluids however still on the soft side. - Continued monitoring on PCU  - monitor vitals closely. - Will give additional fluid boluses as needed -Blood culture x2 pending, follow-up -Continue to trend lactate  Lactic acidosis-resolved.  Secondary to hypovolemia in the setting of ongoing vomiting and diarrhea.  Intractable nausea and vomiting -improved Likely multifactorial in the setting of C. difficile infection, esophagitis and diffuse gastritis seen on EGD 10/13, as well as known carcinoid tumor of the stomach.  No abdominal pain reported and  nontender on exam.    Of note, recently admitted for SBO, however abdominal x-ray on admission showed no evidence of bowel  obstruction. -IV Zofran as needed -IV Protonix 40 mg twice daily -Continue sucralfate -consider GI consult if persistent, however symptoms improved today  Diarrhea -secondary to C. difficile infection.  Stopped loperamide. Diarrhea watery and nonbloody.  No fever.  Suspect this is primary cause of patient's hypotension.  -follow up GI pathogen panel   ESRD on peritoneal dialysis -Nephrology following and has placed orders for peritoneal dialysis  Mildly elevated LFTs T bili 1.6, AST 49.  ALT and alk phos normal.  History of cholecystectomy. -We will repeat CMP to monitor -Consider right upper quadrant also ultrasound if persistent or worsening  Hypomagnesemia Magnesium 1.6 on admission, repleted.  Within normal limits today. -Monitor replete as needed, maintain Mg> 2  History of prediabetes -Sliding scale insulin sensitive and CBG checks.   DVT prophylaxis: Heparin Code Status:   Code Status: Full Code Family Communication: None at bedside Disposition Plan:  Expect d/c home, pending clinical improvement   Consultants:   Infectious disease  Nephrology  Procedures:   Peritoneal dialysis per nephrology  Antimicrobials:   Flagyl IV, start 10/22  Vancomycin PO, start 10/22   Objective: Vitals:   09/10/19 2330 09/11/19 0000 09/11/19 0058 09/11/19 0421  BP: (!) 90/47 (!) 99/44 122/63 (!) 96/50  Pulse: 80  78 77  Resp: (!) 24 (!) 21 19 16   Temp:   97.7 F (36.5 C) (!) 97.5 F (36.4 C)  TempSrc:   Oral Oral  SpO2: 100%  90% 99%  Weight:      Height:        Intake/Output Summary (Last 24 hours) at 09/11/2019 0706 Last data filed at 09/11/2019 0400 Gross per 24 hour  Intake 1600 ml  Output -  Net 1600 ml   Filed Weights   09/10/19 1443  Weight: 43.2 kg    Examination:  General exam: awake, alert, no acute distress HEENT: atraumatic, clear conjunctiva, anicteric sclera, dry mucus membranes, hearing grossly normal  Respiratory system: clear to  auscultation bilaterally, no wheezes, rales or rhonchi, normal respiratory effort. Cardiovascular system: normal S1/S2, RRR, no JVD, murmurs, rubs, gallops, no pedal edema.   Gastrointestinal system: soft, non-tender, non-distended abdomen, no organomegaly or masses felt, hyperactive bowel sounds. Central nervous system: alert and oriented x4. no gross focal neurologic deficits, normal speech Extremities: moves all, no edema, normal tone Skin: dry, intact, normal temperature Psychiatry: normal mood, congruent affect, judgement and insight appear normal    Data Reviewed: I have personally reviewed following labs and imaging studies  CBC: Recent Labs  Lab 09/04/19 1015 09/06/19 0742 09/09/19 1555 09/10/19 1519 09/11/19 0406  WBC 17.4* 10.0 21.8* 12.2* 11.6*  NEUTROABS  --   --  19,337* 9.8*  --   HGB 11.4* 11.6* 10.9* 10.4* 10.6*  HCT 35.4* 35.9* 34.8* 32.0* 33.0*  MCV 97.0 95.5 95.9 96.4 94.8  PLT 345 364 415* 308 656   Basic Metabolic Panel: Recent Labs  Lab 09/05/19 0654 09/06/19 0324 09/09/19 1555 09/10/19 1519 09/11/19 0406  NA 134* 134* 138 134* 137  K 3.2* 3.3* 3.2* 4.2 3.0*  CL 92* 92* 90* 92* 97*  CO2 21* 20* 21 18* 18*  GLUCOSE 183* 180* 176* 187* 114*  BUN 63* 70* 78* 79* 86*  CREATININE 8.33* 8.56* 8.28* 7.58* 7.45*  CALCIUM 9.8 10.0 9.9 9.2 9.2  MG  --   --   --  1.6*  --   PHOS 5.8* 6.9*  --  6.0*  --    GFR: Estimated Creatinine Clearance: 4.2 mL/min (A) (by C-G formula based on SCr of 7.45 mg/dL (H)). Liver Function Tests: Recent Labs  Lab 09/05/19 0654 09/06/19 0324 09/09/19 1555 09/10/19 1519 09/11/19 0406  AST  --   --  10 49* 16  ALT  --   --  13 19 14   ALKPHOS  --   --   --  99 93  BILITOT  --   --  0.5 1.6* 0.9  PROT  --   --  6.0* 5.7* 5.6*  ALBUMIN 2.4* 2.3*  --  2.1* 1.9*   No results for input(s): LIPASE, AMYLASE in the last 168 hours. No results for input(s): AMMONIA in the last 168 hours. Coagulation Profile: No results for  input(s): INR, PROTIME in the last 168 hours. Cardiac Enzymes: No results for input(s): CKTOTAL, CKMB, CKMBINDEX, TROPONINI in the last 168 hours. BNP (last 3 results) No results for input(s): PROBNP in the last 8760 hours. HbA1C: No results for input(s): HGBA1C in the last 72 hours. CBG: Recent Labs  Lab 09/06/19 1138 09/06/19 1635 09/10/19 2304 09/11/19 0540 09/11/19 0626  GLUCAP 147* 146* 143* 111* 112*   Lipid Profile: No results for input(s): CHOL, HDL, LDLCALC, TRIG, CHOLHDL, LDLDIRECT in the last 72 hours. Thyroid Function Tests: No results for input(s): TSH, T4TOTAL, FREET4, T3FREE, THYROIDAB in the last 72 hours. Anemia Panel: No results for input(s): VITAMINB12, FOLATE, FERRITIN, TIBC, IRON, RETICCTPCT in the last 72 hours. Sepsis Labs: Recent Labs  Lab 09/10/19 1519 09/10/19 1719 09/11/19 0406  LATICACIDVEN 2.8* 2.9* 3.0*    Recent Results (from the past 240 hour(s))  C difficile quick scan w PCR reflex     Status: Abnormal   Collection Time: 09/10/19 10:05 PM   Specimen: STOOL  Result Value Ref Range Status   C Diff antigen POSITIVE (A) NEGATIVE Final    Comment: CRITICAL RESULT CALLED TO, READ BACK BY AND VERIFIED WITH: K GILMORE RN 09/11/19 0112 JDW    C Diff toxin POSITIVE (A) NEGATIVE Final   C Diff interpretation Toxin producing C. difficile detected.  Final    Comment: Performed at [facility], [place] [address] [place], [place] [address]  SARS CORONAVIRUS 2 (TAT 6-24 HRS) Nasopharyngeal Nasopharyngeal Swab     Status: None   Collection Time: 09/11/19 12:25 AM   Specimen: Nasopharyngeal Swab  Result Value Ref Range Status   SARS Coronavirus 2 NEGATIVE NEGATIVE Final    Comment: (NOTE) SARS-CoV-2 target nucleic acids are NOT DETECTED. The SARS-CoV-2 RNA is generally detectable in upper and lower respiratory specimens during the acute phase of infection. Negative results do not preclude SARS-CoV-2 infection, do not rule out co-infections with  other pathogens, and should not be used as the sole basis for treatment or other patient management decisions. Negative results must be combined with clinical observations, patient history, and epidemiological information. The expected result is Negative. Fact Sheet for Patients: SugarRoll.be Fact Sheet for Healthcare Providers: https://www.woods-mathews.com/ This test is not yet approved or cleared by the [place] FDA and  has been authorized for detection and/or diagnosis of SARS-CoV-2 by FDA under an Emergency Use Authorization (EUA). This EUA will remain  in effect (meaning this test can be used) for the duration of the COVID-19 declaration under Section 56 4(b)(1) of the Act, 21 U.S.C. section 360bbb-3(b)(1), unless the authorization is terminated or revoked sooner. Performed  at [facility], [place] [address] [place], [place] [address]          Radiology Studies: Dg Abd Acute 2+v W 1v Chest  Result Date: 09/10/2019 CLINICAL DATA:  Concern for small bowel obstruction EXAM: DG ABDOMEN ACUTE W/ 1V CHEST COMPARISON:  Abdominal radiograph dated 09/05/2019 FINDINGS: There is no evidence of dilated bowel loops or free intraperitoneal air. Degenerative changes are seen in the left hip and lumbar spine. A right hip arthroplasty is seen. A peritoneal dialysis catheter is noted in the pelvis. A calcified fibroid is redemonstrated. Heart size and mediastinal contours are within normal limits. Vascular calcifications are seen in the aortic arch. Both lungs are clear. IMPRESSION: No evidence of bowel obstruction or free intraperitoneal air. No acute cardiopulmonary findings. Aortic Atherosclerosis (ICD10-I70.0). Electronically Signed   By: Zerita Boers M.D.   On: 09/10/2019 17:20        Scheduled Meds: . allopurinol  300 mg Oral QODAY  . aspirin  81 mg Oral QHS  . cinacalcet  30 mg Oral Q breakfast  . feeding supplement (NEPRO CARB  STEADY)  237 mL Oral BID BM  . heparin  5,000 Units Subcutaneous Q8H  . insulin aspart  0-9 Units Subcutaneous TID WC  . multivitamin  1 tablet Oral QHS  . pantoprazole (PROTONIX) IV  40 mg Intravenous Q12H  . rosuvastatin  5 mg Oral QPC supper  . sevelamer carbonate  800 mg Oral TID WC  . sucralfate  1 g Oral TID WC & HS  . vancomycin  500 mg Oral Q6H   Continuous Infusions: . sodium chloride    . metronidazole       LOS: 0 days    Time spent: 40-45 min    Ezekiel Slocumb, DO [facility] Pager: [phone]  If 7PM-7AM, please contact night-coverage [email or web address] Password [email or web address] 09/11/2019, 7:06 AM

## 2019-09-11 NOTE — Consult Note (Signed)
[place] Nurse wound consult note Patient receiving care in [place].  Consult completed remotely after review of record and phone conversation with primary RN, [place]. Reason for Consult: coccyx wound Wound type: description from [facility] consistent with a stage 2 PI.  Area is impacted by home use of diapers and ongoing fecal incontinence. Pressure Injury POA: Yes Measurement: Per Kellee, approximately 2 inches on either side of the top aspect of the gluteal fold Wound bed: pink, moist Drainage (amount, consistency, odor) none Periwound: intact Dressing procedure/placement/frequency:  I have enacted the Skin Care Standing order set for this wound.  I have entered orders for the primary RN to measure the area and place the measurements and wound characteristics in the flowsheet section. Monitor the wound area(s) for worsening of condition such as: Signs/symptoms of infection,  Increase in size,  Development of or worsening of odor, Development of pain, or increased pain at the affected locations.  Notify the medical team if any of these develop.  Thank you for the consult.  Discussed plan of care with the bedside nurse.  [facility] nurse will not follow at this time.  Please re-consult the [place] team if needed.  Val Riles, RN, MSN, CWOCN, CNS-BC, pager [phone]

## 2019-09-11 NOTE — Progress Notes (Addendum)
[facility] Progress Note   Subjective:   Patinet has been on PD x2.5 years with no Hx hemodialysis.  Followed as OP by Dr. Moshe Cipro.  Recent [facility] admit from 10/8-10/17 d/t SBO.  Hx carcinoid tumor of the stomach. Lives at home alone.  Presented to ED due to weakness, nausea and severe diarrhea. Denies CP, SOB, fever, chills and edema.  Admits to nausea and abdominal cramping. C Diff antigen positive.   Objective Vitals:   09/11/19 0058 09/11/19 0421 09/11/19 0801 09/11/19 1005  BP: 122/63 (!) 96/50 103/62   Pulse: 78 77 79   Resp: 19 16 18    Temp: 97.7 F (36.5 C) (!) 97.5 F (36.4 C) 98.6 F (37 C)   TempSrc: Oral Oral Oral   SpO2: 90% 99% 100%   Weight:    47.2 kg  Height:       Physical Exam General:NAD, well appearing female, laying in bed Heart:RRR, no murmur, rub or gallop Lungs:CTAB, nml WOB Abdomen:soft, +BS, NTND Extremities:no edema, scarring on b/l knees Dialysis Access: PD catheter in LLQ - dressed   Filed Weights   09/10/19 1443 09/11/19 1005  Weight: 43.2 kg 47.2 kg    Intake/Output Summary (Last 24 hours) at 09/11/2019 1033 Last data filed at 09/11/2019 7342 Gross per 24 hour  Intake 1720 ml  Output -  Net 1720 ml    Additional Objective Labs: Basic Metabolic Panel: Recent Labs  Lab 09/05/19 0654 09/06/19 0324 09/09/19 1555 09/10/19 1519 09/11/19 0406  NA 134* 134* 138 134* 137  K 3.2* 3.3* 3.2* 4.2 3.0*  CL 92* 92* 90* 92* 97*  CO2 21* 20* 21 18* 18*  GLUCOSE 183* 180* 176* 187* 114*  BUN 63* 70* 78* 79* 86*  CREATININE 8.33* 8.56* 8.28* 7.58* 7.45*  CALCIUM 9.8 10.0 9.9 9.2 9.2  PHOS 5.8* 6.9*  --  6.0*  --    Liver Function Tests: Recent Labs  Lab 09/06/19 0324 09/09/19 1555 09/10/19 1519 09/11/19 0406  AST  --  10 49* 16  ALT  --  13 19 14   ALKPHOS  --   --  99 93  BILITOT  --  0.5 1.6* 0.9  PROT  --  6.0* 5.7* 5.6*  ALBUMIN 2.3*  --  2.1* 1.9*   CBC: Recent Labs  Lab 09/06/19 0742 09/09/19 1555  09/10/19 1519 09/11/19 0406  WBC 10.0 21.8* 12.2* 11.6*  NEUTROABS  --  19,337* 9.8*  --   HGB 11.6* 10.9* 10.4* 10.6*  HCT 35.9* 34.8* 32.0* 33.0*  MCV 95.5 95.9 96.4 94.8  PLT 364 415* 308 287   CBG: Recent Labs  Lab 09/06/19 1138 09/06/19 1635 09/10/19 2304 09/11/19 0540 09/11/19 0626  GLUCAP 147* 146* 143* 111* 112*   Studies/Results: Dg Abd Acute 2+v W 1v Chest  Result Date: 09/10/2019 CLINICAL DATA:  Concern for small bowel obstruction EXAM: DG ABDOMEN ACUTE W/ 1V CHEST COMPARISON:  Abdominal radiograph dated 09/05/2019 FINDINGS: There is no evidence of dilated bowel loops or free intraperitoneal air. Degenerative changes are seen in the left hip and lumbar spine. A right hip arthroplasty is seen. A peritoneal dialysis catheter is noted in the pelvis. A calcified fibroid is redemonstrated. Heart size and mediastinal contours are within normal limits. Vascular calcifications are seen in the aortic arch. Both lungs are clear. IMPRESSION: No evidence of bowel obstruction or free intraperitoneal air. No acute cardiopulmonary findings. Aortic Atherosclerosis (ICD10-I70.0). Electronically Signed   By: Harley Hallmark.D.  On: 09/10/2019 17:20    Medications: . sodium chloride 75 mL/hr at 09/11/19 0851  . metronidazole 500 mg (09/11/19 0848)   . allopurinol  300 mg Oral QODAY  . aspirin  81 mg Oral QHS  . cinacalcet  30 mg Oral Q breakfast  . feeding supplement  1 Container Oral BID BM  . [START ON 09/12/2019] feeding supplement (NEPRO CARB STEADY)  237 mL Oral QHS  . feeding supplement (PRO-STAT SUGAR FREE 64)  30 mL Oral TID WC  . heparin  5,000 Units Subcutaneous Q8H  . insulin aspart  0-9 Units Subcutaneous TID WC  . multivitamin  1 tablet Oral QHS  . pantoprazole (PROTONIX) IV  40 mg Intravenous Q12H  . rosuvastatin  5 mg Oral QPC supper  . sevelamer carbonate  800 mg Oral TID WC  . sucralfate  1 g Oral TID WC & HS  . vancomycin  500 mg Oral Q6H    Dialysis  Orders: CCPD - 7x wk EDW 48.5kg 5 exchanges Fill vol 2562mL, Dwell time 2hrs, No day dwell Uses 1.5% solution  Assessment/Plan: 1. C. Diff - C diff antigen +. Sig diarrhea burden per pt. Per primary 2. Intractable n/v - multifactorial.  No SBO on abdominal x-ray. Per primary  3. Hypokalemia - K 3.0. On K supplements. 4. ESRD - on PD.  Orders written for PD using 1.5% solution. 5. 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 No indication for ESA at this time. 6. Secondary hyperparathyroidism - Ca elevated, CCa 10.8.  Will check phos. No VDRA, use low Ca bath when able. Continue sensipar. 7. Hypotension/volume - BP soft likely due to volume depletion, continue NS 35mL/hr for now to off set GI losses. Continue using 1.5% solution with PD.  Not on antihypertensives.  8. Nutrition - Renal diet w/fluid restrictions.    Jen Mow, PA-C [facility] Pager: [phone] 09/11/2019,10:33 AM  LOS: 0 days   Pt seen, examined and agree w A/P as above.  Kelly Splinter  MD 09/11/2019, 4:43 PM

## 2019-09-11 NOTE — Progress Notes (Signed)
RN paged [facility] via Amion, concerning Pt recent lab results.  Lactic acid 3.0 and Potassium

## 2019-09-12 DIAGNOSIS — D509 Iron deficiency anemia, unspecified: Secondary | ICD-10-CM | POA: Diagnosis not present

## 2019-09-12 DIAGNOSIS — Z23 Encounter for immunization: Secondary | ICD-10-CM | POA: Diagnosis not present

## 2019-09-12 DIAGNOSIS — Z992 Dependence on renal dialysis: Secondary | ICD-10-CM | POA: Diagnosis not present

## 2019-09-12 DIAGNOSIS — N2581 Secondary hyperparathyroidism of renal origin: Secondary | ICD-10-CM | POA: Diagnosis not present

## 2019-09-12 DIAGNOSIS — R82998 Other abnormal findings in urine: Secondary | ICD-10-CM | POA: Diagnosis not present

## 2019-09-12 DIAGNOSIS — D631 Anemia in chronic kidney disease: Secondary | ICD-10-CM | POA: Diagnosis not present

## 2019-09-12 DIAGNOSIS — N2589 Other disorders resulting from impaired renal tubular function: Secondary | ICD-10-CM | POA: Diagnosis not present

## 2019-09-12 DIAGNOSIS — E876 Hypokalemia: Secondary | ICD-10-CM | POA: Diagnosis not present

## 2019-09-12 DIAGNOSIS — N186 End stage renal disease: Secondary | ICD-10-CM | POA: Diagnosis not present

## 2019-09-12 DIAGNOSIS — Z4932 Encounter for adequacy testing for peritoneal dialysis: Secondary | ICD-10-CM | POA: Diagnosis not present

## 2019-09-12 DIAGNOSIS — E119 Type 2 diabetes mellitus without complications: Secondary | ICD-10-CM | POA: Diagnosis not present

## 2019-09-12 LAB — CBC WITH DIFFERENTIAL/PLATELET
Abs Immature Granulocytes: 0.06 10*3/uL (ref 0.00–0.07)
Basophils Absolute: 0 10*3/uL (ref 0.0–0.1)
Basophils Relative: 0 %
Eosinophils Absolute: 0.1 10*3/uL (ref 0.0–0.5)
Eosinophils Relative: 0 %
HCT: 27.7 % — ABNORMAL LOW (ref 36.0–46.0)
Hemoglobin: 8.7 g/dL — ABNORMAL LOW (ref 12.0–15.0)
Immature Granulocytes: 1 %
Lymphocytes Relative: 8 %
Lymphs Abs: 1 10*3/uL (ref 0.7–4.0)
MCH: 29.9 pg (ref 26.0–34.0)
MCHC: 31.4 g/dL (ref 30.0–36.0)
MCV: 95.2 fL (ref 80.0–100.0)
Monocytes Absolute: 0.5 10*3/uL (ref 0.1–1.0)
Monocytes Relative: 5 %
Neutro Abs: 10 10*3/uL — ABNORMAL HIGH (ref 1.7–7.7)
Neutrophils Relative %: 86 %
Platelets: 285 10*3/uL (ref 150–400)
RBC: 2.91 MIL/uL — ABNORMAL LOW (ref 3.87–5.11)
RDW: 16.8 % — ABNORMAL HIGH (ref 11.5–15.5)
WBC: 11.9 10*3/uL — ABNORMAL HIGH (ref 4.0–10.5)
nRBC: 0 % (ref 0.0–0.2)

## 2019-09-12 LAB — BASIC METABOLIC PANEL
Anion gap: 17 — ABNORMAL HIGH (ref 5–15)
BUN: 77 mg/dL — ABNORMAL HIGH (ref 8–23)
CO2: 19 mmol/L — ABNORMAL LOW (ref 22–32)
Calcium: 8.9 mg/dL (ref 8.9–10.3)
Chloride: 99 mmol/L (ref 98–111)
Creatinine, Ser: 7.23 mg/dL — ABNORMAL HIGH (ref 0.44–1.00)
GFR calc Af Amer: 6 mL/min — ABNORMAL LOW (ref >60)
GFR calc non Af Amer: 5 mL/min — ABNORMAL LOW (ref >60)
Glucose, Bld: 116 mg/dL — ABNORMAL HIGH (ref 70–99)
Potassium: 3 mmol/L — ABNORMAL LOW (ref 3.5–5.1)
Sodium: 135 mmol/L (ref 135–145)

## 2019-09-12 LAB — COMPREHENSIVE METABOLIC PANEL
ALT: 12 U/L (ref 0–44)
AST: 14 U/L — ABNORMAL LOW (ref 15–41)
Albumin: 1.7 g/dL — ABNORMAL LOW (ref 3.5–5.0)
Alkaline Phosphatase: 90 U/L (ref 38–126)
Anion gap: 19 — ABNORMAL HIGH (ref 5–15)
BUN: 78 mg/dL — ABNORMAL HIGH (ref 8–23)
CO2: 16 mmol/L — ABNORMAL LOW (ref 22–32)
Calcium: 9 mg/dL (ref 8.9–10.3)
Chloride: 100 mmol/L (ref 98–111)
Creatinine, Ser: 7.06 mg/dL — ABNORMAL HIGH (ref 0.44–1.00)
GFR calc Af Amer: 6 mL/min — ABNORMAL LOW (ref >60)
GFR calc non Af Amer: 5 mL/min — ABNORMAL LOW (ref >60)
Glucose, Bld: 157 mg/dL — ABNORMAL HIGH (ref 70–99)
Potassium: 2.7 mmol/L — CL (ref 3.5–5.1)
Sodium: 135 mmol/L (ref 135–145)
Total Bilirubin: 0.5 mg/dL (ref 0.3–1.2)
Total Protein: 4.7 g/dL — ABNORMAL LOW (ref 6.5–8.1)

## 2019-09-12 LAB — GLUCOSE, CAPILLARY
Glucose-Capillary: 170 mg/dL — ABNORMAL HIGH (ref 70–99)
Glucose-Capillary: 124 mg/dL — ABNORMAL HIGH (ref 70–99)
Glucose-Capillary: 107 mg/dL — ABNORMAL HIGH (ref 70–99)
Glucose-Capillary: 131 mg/dL — ABNORMAL HIGH (ref 70–99)

## 2019-09-12 LAB — MAGNESIUM
Magnesium: 1.6 mg/dL — ABNORMAL LOW (ref 1.7–2.4)
Magnesium: 2.1 mg/dL (ref 1.7–2.4)

## 2019-09-12 MED ORDER — MAGNESIUM SULFATE 4 GM/100ML IV SOLN
4.0000 g | Freq: Once | INTRAVENOUS | Status: AC
  Administered 2019-09-12: 4 g via INTRAVENOUS
  Filled 2019-09-12: qty 100

## 2019-09-12 MED ORDER — POTASSIUM CHLORIDE 10 MEQ/100ML IV SOLN
10.0000 meq | INTRAVENOUS | Status: AC
  Administered 2019-09-12 (×3): 10 meq via INTRAVENOUS
  Filled 2019-09-12 (×2): qty 100

## 2019-09-12 MED ORDER — POTASSIUM CHLORIDE 10 MEQ/100ML IV SOLN
10.0000 meq | INTRAVENOUS | Status: AC
  Administered 2019-09-12 (×3): 10 meq via INTRAVENOUS
  Filled 2019-09-12 (×3): qty 100

## 2019-09-12 NOTE — Progress Notes (Addendum)
PROGRESS NOTE    Eileen Perez  LJQ:[id number] DOB: 08/29/1941 DOA: 09/10/2019 PCP: Unk Pinto, MD    Brief Narrative:  78 y.o. 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  History provided by patient and daughter at bedside. Patient's blood pressure has been low since her recent hospital discharge, so she has not been taking her blood pressure medications. Also reports vomiting, as well as multiple episodes of watery, nonbloody diarrhea since her hospital discharge.  No recent antibiotic use.  Denies fevers or abdominal pain.  In the ED, blood pressure 60/40 per EMS and improved to 90/50 after 500 cc normal saline bolus.  Not tachycardic or febrile.  White blood cell count 12.2, down from 21.8 just yesterday (10/20).  Lactic acid 2.8 >2.9.  Blood culture x2 pending.  Hemoglobin 10.4, recently ranging from 10.9-11.6.  T bili 1.6, AST 49.  ALT and alk phos normal.  Magnesium 1.6, repleted.  C. difficile positive.  GI pathogen panel pending.  Abdominal x-ray showing no evidence of bowel obstruction or free intraperitoneal air. Chest x-ray showing no acute cardiopulmonary disease. Patient received Zofran and a 500 cc normal saline bolus.   Patient was admitted to telemetry, and started on therapy for fulminant C. difficile infection with oral vancomycin and IV metronidazole, given her hypotension.  Infectious disease following.  Blood pressure has improved with IV fluids but still on the soft side.  Nephrology also following for peritoneal dialysis during her stay.  Subjective 10/23: Patient seen this morning, laying in bed, awake.  PD had finished.  Reports feeling better today, nausea improved, no  vomiting.  Reports stool has become more formed, no longer watery, and less frequent.  Still reports poor appetite, wanting soft bland foods only for now.  Assessment & Plan:   Principal Problem:   Hypotension Active Problems:   Hypomagnesemia   ESRD (end stage renal disease) (HCC)   Intractable nausea and vomiting   Diarrhea   Pressure injury of skin   Clostridioides difficile infection   Dehydration   Fulminant C. difficile infection  improving.  C. difficile antigen and toxin positive.  Given hypotension this is considered fulminant CDI.  Risk factors include recent hospital admission for SBO, PPI use.  No recent antibiotics. - Infectious disease to follow - C. difficile order set utilized - continue high dose oral vancomycin 500 mg q6h - will stop IV Flagyl 500 mg q8h since improving  Hypomagnesemia - recurrent in setting of diarrhea. Mg 1.6 this AM. - Monitor & replete as needed - Maintain Mg> 2  Hypokalemia - in setting of diarrhea/GI loss. K 2.7 this AM. - repleted by IV - re-check this afternoon - continue monitoring & replete as needed for goal K >=4   Hypotension -improving.  Secondary to hypovolemia from ongoing diarrhea and vomiting, possibly peritoneal dialysis as well.  Doubt sepsis given no fever or tachycardia.  Leukocytosis significantly improved 11.9 today from 21.82 on 10/20.  Lactate elevated at 2.8, 2.9 likely secondary to hypovolemia, resolved with fluids.  Doubt peritonitis given no abdominal pain or fever present on admission but will monitor closely given risk factor of peritoneal dialysis.  No evidence of acute blood loss.  Blood pressure improved with IV fluid boluses and continued maintenance fluids however still on the soft side. -  Continued monitoring on PCU  - monitor vitals closely - maintain MAP > 65 - Will give additional fluid boluses as needed - Blood culture x2 pending, follow-up (no growth x1 day)   Lactic acidosisresolved.  Secondary to  hypovolemia in the setting of ongoing vomiting and diarrhea.   Intractable nausea and vomiting -improved Likely multifactorial in the setting of C. difficile infection, esophagitis and diffuse gastritis seen on EGD 10/13, as well as known carcinoid tumor of the stomach.  No abdominal pain reported and nontender on exam.    Of note, recently admitted for SBO, however abdominal x-ray on admission showed no evidence of bowel obstruction. -IV Zofran as needed -IV Protonix 40 mg twice daily -Continue sucralfate -consider GI consult if persistent or worsening, however symptoms improved    Diarrhea -improving.  Secondary to C. difficile infection.  Stopped loperamide.  Diarrhea watery and nonbloody.  No fever.  Suspect this is primary cause of patient's hypotension.  -follow up GI pathogen panel    ESRD on peritoneal dialysis -Nephrology following and has placed orders for peritoneal dialysis   Mildly elevated LFTs - resolved, now WNL. On admission: T bili 1.6, AST 49.  ALT and alk phos normal.  History of cholecystectomy. - monitor   History of prediabetes -Sliding scale insulin sensitive and CBG checks.   Stage 2 pressure injury coccyx POA  - wound care following  DVT prophylaxis: Heparin Code Status:   Code Status: Full Code Family Communication: None at bedside Disposition Plan:  Expect d/c home, pending further clinical improvement, estimate 2 more days     Consultants:  Infectious disease Nephrology   Procedures:  Peritoneal dialysis per nephrology   Antimicrobials:  Flagyl IV, start 10/22 Vancomycin PO, start 10/22   Objective: Vitals:   09/12/19 0031 09/12/19 0540 09/12/19 0703 09/12/19 0810  BP: (!) 107/54 (!) 108/59  104/69  Pulse: 84 87  84  Resp: _0 Temp: 97.6 F (36.4 C) 98.3 F (36.8 C)  97.7 F (36.5 C)  TempSrc: Oral Oral  Oral  SpO2: 100% 100%  100%  Weight:   51.3 kg   Height:        Intake/Output Summary (Last 24 hours) at 09/12/2019 0934  Last data filed at 09/11/2019 2245 Gross per 24 hour  Intake 1259.36 ml  Output   Net 1259.36 ml   Filed Weights   09/11/19 1005 09/11/19 1753 09/12/19 0703  Weight: 47.2 kg 51.3 kg 51.3 kg    Examination:  General exam: awake, alert, no acute distress, frail Respiratory system: clear to auscultation bilaterally, normal respiratory effort. Cardiovascular system: normal S1/S2, RRR, no murmurs, rubs, gallops, no pedal edema.   Gastrointestinal system: PD catheter in place, soft, mildly tender, non-distended, normal bowel sounds. Central nervous system: alert and oriented x4. no gross focal neurologic deficits, normal speech Extremities: moves all, no edema, normal tone Skin: dry, intact, normal temperature,  Psychiatry: normal mood, congruent affect, judgement and insight appear normal    Data Reviewed: I have personally reviewed following labs and imaging studies  CBC: Recent Labs  Lab 09/06/19 0742 09/09/19 1555 09/10/19 1519 09/11/19 0406 09/12/19 0440  WBC 10.0 21.8* 12.2* 11.6* 11.9*  NEUTROABS  --  19,337* 9.8*  --  10.0*  HGB 11.6* 10.9* 10.4* 10.6* 8.7*  HCT 35.9* 34.8* 32.0* 33.0* 27.7*  MCV 95.5 95.9 96.4 94.8 95.2  PLT 364 415* 308 287 177   Basic Metabolic Panel: Recent Labs  Lab 09/06/19 0324 09/09/19  1555 09/10/19 1519 09/11/19 0406 09/11/19 1611 09/12/19 0440  NA 134* 138 134* 137 137 135  K 3.3* 3.2* 4.2 3.0* 3.4* 2.7*  CL 92* 90* 92* 97* 97* 100  CO2 20* 21 18* 18* 19* 16*  GLUCOSE 180* 176* 187* 114* 99 157*  BUN 70* 78* 79* 86* 91* 78*  CREATININE 8.56* 8.28* 7.58* 7.45* 7.81* 7.06*  CALCIUM 10.0 9.9 9.2 9.2 9.2 9.0  MG  --   --  1.6*  --  1.7 1.6*  PHOS 6.9*  --  6.0*  --  5.9*  --    GFR: Estimated Creatinine Clearance: 4.9 mL/min (A) (by C-G formula based on SCr of 7.06 mg/dL (H)). Liver Function Tests: Recent Labs  Lab 09/06/19 0324 09/09/19 1555 09/10/19 1519 09/11/19 0406 09/12/19 0440  AST  --  10 49* 16 14*  ALT  --  _0 ALKPHOS  --   --  99 93 90  BILITOT  --  0.5 1.6* 0.9 0.5  PROT  --  6.0* 5.7* 5.6* 4.7*  ALBUMIN 2.3*  --  2.1* 1.9* 1.7*   No results for input(s): LIPASE, AMYLASE in the last 168 hours. No results for input(s): AMMONIA in the last 168 hours. Coagulation Profile: No results for input(s): INR, PROTIME in the last 168 hours. Cardiac Enzymes: No results for input(s): CKTOTAL, CKMB, CKMBINDEX, TROPONINI in the last 168 hours. BNP (last 3 results) No results for input(s): PROBNP in the last 8760 hours. HbA1C: No results for input(s): HGBA1C in the last 72 hours. CBG: Recent Labs  Lab 09/11/19 0626 09/11/19 1157 09/11/19 1658 09/11/19 2148 09/12/19 0608  GLUCAP 112* 102* 93 124* 170*   Lipid Profile: No results for input(s): CHOL, HDL, LDLCALC, TRIG, CHOLHDL, LDLDIRECT in the last 72 hours. Thyroid Function Tests: No results for input(s): TSH, T4TOTAL, FREET4, T3FREE, THYROIDAB in the last 72 hours. Anemia Panel: No results for input(s): VITAMINB12, FOLATE, FERRITIN, TIBC, IRON, RETICCTPCT in the last 72 hours. Sepsis Labs: Recent Labs  Lab 09/10/19 1519 09/10/19 1719 09/11/19 0406 09/11/19 1625  LATICACIDVEN 2.8* 2.9* 3.0* 1.3    Recent Results (from the past 240 hour(s))  Blood culture (routine x 2)     Status: None (Preliminary result)   Collection Time: 09/10/19  1:52 AM   Specimen: BLOOD RIGHT FOREARM  Result Value Ref Range Status   Specimen Description BLOOD RIGHT FOREARM  Final   Special Requests   Final    BOTTLES DRAWN AEROBIC AND ANAEROBIC Blood Culture results may not be optimal due to an inadequate volume of blood received in culture bottles   Culture   Final    NO GROWTH 1 DAY Performed at [facility], [place] [address], [place], [place] [address]    Report Status PENDING  Incomplete  C difficile quick scan w PCR reflex     Status: Abnormal   Collection Time: 09/10/19 10:05 PM   Specimen: STOOL  Result Value Ref Range Status   C Diff  antigen POSITIVE (A) NEGATIVE Final    Comment: CRITICAL RESULT CALLED TO, READ BACK BY AND VERIFIED WITH: K GILMORE RN 09/11/19 0112 JDW    C Diff toxin POSITIVE (A) NEGATIVE Final   C Diff interpretation Toxin producing C. difficile detected.  Final    Comment: Performed at [facility], [place] [address], [place], [place] [address]  SARS CORONAVIRUS 2 (TAT 6-24 HRS) Nasopharyngeal Nasopharyngeal Swab     Status:  None   Collection Time: 09/11/19 12:25 AM   Specimen: Nasopharyngeal Swab  Result Value Ref Range Status   SARS Coronavirus 2 NEGATIVE NEGATIVE Final    Comment: (NOTE) SARS-CoV-2 target nucleic acids are NOT DETECTED. The SARS-CoV-2 RNA is generally detectable in upper and lower respiratory specimens during the acute phase of infection. Negative results do not preclude SARS-CoV-2 infection, do not rule out co-infections with other pathogens, and should not be used as the sole basis for treatment or other patient management decisions. Negative results must be combined with clinical observations, patient history, and epidemiological information. The expected result is Negative. Fact Sheet for Patients: SugarRoll.be Fact Sheet for Healthcare Providers: https://www.woods-mathews.com/ This test is not yet approved or cleared by the [place] FDA and  has been authorized for detection and/or diagnosis of SARS-CoV-2 by FDA under an Emergency Use Authorization (EUA). This EUA will remain  in effect (meaning this test can be used) for the duration of the COVID-19 declaration under Section 56 4(b)(1) of the Act, 21 U.S.C. section 360bbb-3(b)(1), unless the authorization is terminated or revoked sooner. Performed at [facility], [place] [address] [place], [place] [address]   Blood culture (routine x 2)     Status: None (Preliminary result)   Collection Time: 09/11/19  3:48 AM   Specimen: BLOOD LEFT HAND  Result Value Ref  Range Status   Specimen Description BLOOD LEFT HAND  Final   Special Requests   Final    AEROBIC BOTTLE ONLY Blood Culture results may not be optimal due to an inadequate volume of blood received in culture bottles   Culture   Final    NO GROWTH 1 DAY Performed at [facility], [place] [address], [place], [place] [address]    Report Status PENDING  Incomplete         Radiology Studies: Dg Abd Acute 2+v W 1v Chest  Result Date: 09/10/2019 CLINICAL DATA:  Concern for small bowel obstruction EXAM: DG ABDOMEN ACUTE W/ 1V CHEST COMPARISON:  Abdominal radiograph dated 09/05/2019 FINDINGS: There is no evidence of dilated bowel loops or free intraperitoneal air. Degenerative changes are seen in the left hip and lumbar spine. A right hip arthroplasty is seen. A peritoneal dialysis catheter is noted in the pelvis. A calcified fibroid is redemonstrated. Heart size and mediastinal contours are within normal limits. Vascular calcifications are seen in the aortic arch. Both lungs are clear. IMPRESSION: No evidence of bowel obstruction or free intraperitoneal air. No acute cardiopulmonary findings. Aortic Atherosclerosis (ICD10-I70.0). Electronically Signed   By: Zerita Boers M.D.   On: 09/10/2019 17:20        Scheduled Meds:  allopurinol  300 mg Oral QODAY   aspirin  81 mg Oral QHS   cinacalcet  30 mg Oral Q breakfast   feeding supplement  1 Container Oral BID BM   feeding supplement (NEPRO CARB STEADY)  237 mL Oral QHS   feeding supplement (PRO-STAT SUGAR FREE 64)  30 mL Oral TID WC   gentamicin cream  1 application Topical Daily   heparin  5,000 Units Subcutaneous Q8H   insulin aspart  0-9 Units Subcutaneous TID WC   multivitamin  1 tablet Oral QHS   pantoprazole (PROTONIX) IV  40 mg Intravenous Q12H   rosuvastatin  5 mg Oral QPC supper   sevelamer carbonate  800 mg Oral TID WC   sucralfate  1 g Oral TID WC & HS   vancomycin  500 mg Oral  Q6H   Continuous Infusions:  sodium  chloride 75 mL/hr at 09/12/19 0025   dialysis solution 1.5% low-MG/low-CA     magnesium sulfate bolus IVPB     metronidazole 500 mg (09/12/19 0607)   potassium chloride       LOS: 1 day    Time spent: 30-35 min    Ezekiel Slocumb, DO [facility] Pager: [phone]  If 7PM-7AM, please contact night-coverage [email or web address] Password [facility] 09/12/2019, 9:34 AM

## 2019-09-12 NOTE — TOC Initial Note (Addendum)
Transition of Care [facility]) - Initial/Assessment Note    Patient Details  Name: Eileen Perez MRN: [id number] Date of Birth: May 01, 1941  Transition of [facility]) CM/SW Contact:    Zenon Mayo, RN Phone Number: 09/12/2019, 7:06 PM  Clinical Narrative:                 Patient from home alone, she was previously set up with [facility] for RN, PT, and aide from previous NCM note, so should be still active. She states she has transportation at discharge and her daughter has set up with the pharmacy to have her meds to be delivered to her.  NCM will need to make sure patient is still active with [facility] and see if she wants to continue with [facility] , she states her daughter is looking into this.  She states they have not really started yet.   Expected Discharge Plan: [place] Barriers to Discharge: No Barriers Identified   Patient Goals and CMS Choice Patient states their goals for this hospitalization and ongoing recovery are:: to be able to do what she has to do      Expected Discharge Plan and Services Expected Discharge Plan: [place]   Discharge Planning Services: CM Consult Post Acute Care Choice: Resumption of Svcs/PTA Provider Living arrangements for the past 2 months: Single Family Home                 DME Arranged: (NA)                    Prior Living Arrangements/Services Living arrangements for the past 2 months: Single Family Home Lives with:: Self Patient language and need for interpreter reviewed:: Yes Do you feel safe going back to the place where you live?: Yes      Need for Family Participation in Patient Care: Yes (Comment) Care giver support system in place?: Yes (comment) Current home services: DME, Home RN, Homehealth aide, Home PT(walker and shower chair) Criminal Activity/Legal Involvement Pertinent to Current Situation/Hospitalization: No - Comment as needed  Activities of Daily Living Home Assistive  Devices/Equipment: Environmental consultant (specify type) ADL Screening (condition at time of admission) Patient's cognitive ability adequate to safely complete daily activities?: Yes Is the patient deaf or have difficulty hearing?: No Does the patient have difficulty seeing, even when wearing glasses/contacts?: No Does the patient have difficulty concentrating, remembering, or making decisions?: No Patient able to express need for assistance with ADLs?: Yes Does the patient have difficulty dressing or bathing?: Yes Independently performs ADLs?: Yes (appropriate for developmental age) Does the patient have difficulty walking or climbing stairs?: Yes Weakness of Legs: Both Weakness of Arms/Hands: None  Permission Sought/Granted                  Emotional Assessment   Attitude/Demeanor/Rapport: Gracious Affect (typically observed): Appropriate Orientation: : Oriented to Self, Oriented to Place, Oriented to  Time, Oriented to Situation Alcohol / Substance Use: Not Applicable Psych Involvement: No (comment)  Admission diagnosis:  Dehydration [E86.0] Weakness [R53.1] Diarrhea, unspecified type [R19.7] Hypotension due to hypovolemia [I95.89, E86.1] Patient Active Problem List   Diagnosis Date Noted  . Intractable nausea and vomiting 09/11/2019  . Diarrhea 09/11/2019  . Hypomagnesemia 09/11/2019  . Pressure injury of skin 09/11/2019  . Clostridioides difficile infection 09/11/2019  . Dehydration   . Hypotension 09/10/2019  . Esophagitis, [place] grade B 09/06/2019  . Gastritis 09/06/2019  . Protein-calorie malnutrition, severe 08/29/2019  .  Small bowel obstruction ([place]) 08/28/2019  . Schatzki's ring of distal esophagus 06/23/2019  . 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 History of colon polyps 06/23/2019  . Enrolled in chronic care management 02/20/2019  . Aortic atherosclerosis ([place]) 02/13/2018  . 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 Overweight (BMI 25.0-29.9) 10/31/2015  . ESRD  (end stage renal disease) ([place]) 10/31/2015  . OA (osteoarthritis) of hip 05/26/2015  . Secondary hyperparathyroidism ([place]D) 04/16/2014  . Vitamin D deficiency 04/16/2014  . Hyperlipidemia, mixed 04/16/2014  . Medication management 04/16/2014  . Anemia of chronic Renal Dz   . Pancreatic cyst   . GERD   . Gout   . Osteopenia   . Essential hypertension 06/10/2013  . DJD 06/09/2013   PCP:  Unk Pinto, MD Pharmacy:   [facility], [place] [address] [place] [address] Phone: [phone] Fax: [place], [facility] [address] [place] [address] [place] [address] [place] [address] Phone: [phone] Fax: [phone]     Social Determinants of Health ([place]) Interventions    Readmission Risk Interventions Readmission Risk Prevention Plan 09/12/2019  Transportation Screening Complete  Social Work Consult for [name] Planning/Counseling [place] Screening Complete  Medication Review Press photographer) Complete  Some recent data might be hidden

## 2019-09-12 NOTE — Progress Notes (Addendum)
[facility] Progress Note   Subjective:   Patient seen and examined at bedside.  Reports feeling bad today, mostly tired and weak.   Increased bloating and some nausea with initial PD fill last night, but improved throughout the night.  Not eating, doesn't feel hungry. States her stool is starting to "thicken up" not as watery as before.   Objective Vitals:   09/12/19 0031 09/12/19 0540 09/12/19 0703 09/12/19 0810  BP: (!) 107/54 (!) 108/59  104/69  Pulse: 84 87  84  Resp: 18 20  17   Temp: 97.6 F (36.4 C) 98.3 F (36.8 C)  97.7 F (36.5 C)  TempSrc: Oral Oral  Oral  SpO2: 100% 100%  100%  Weight:   51.3 kg   Height:       Physical Exam General:NAD Heart:RRR Lungs:CTAB Abdomen:soft, NTND, PD cath in LLQ accessed Extremities:no LE edema Dialysis Access: PD cath in LLQ accessed   [facility] Weights   09/11/19 1005 09/11/19 1753 09/12/19 0703  Weight: 47.2 kg 51.3 kg 51.3 kg    Intake/Output Summary (Last 24 hours) at 09/12/2019 1137 Last data filed at 09/12/2019 0943 Gross per 24 hour  Intake 1183.51 ml  Output -  Net 1183.51 ml    Additional Objective Labs: Basic Metabolic Panel: Recent Labs  Lab 09/06/19 0324  09/10/19 1519 09/11/19 0406 09/11/19 1611 09/12/19 0440  NA 134*   < > 134* 137 137 135  K 3.3*   < > 4.2 3.0* 3.4* 2.7*  CL 92*   < > 92* 97* 97* 100  CO2 20*   < > 18* 18* 19* 16*  GLUCOSE 180*   < > 187* 114* 99 157*  BUN 70*   < > 79* 86* 91* 78*  CREATININE 8.56*   < > 7.58* 7.45* 7.81* 7.06*  CALCIUM 10.0   < > 9.2 9.2 9.2 9.0  PHOS 6.9*  --  6.0*  --  5.9*  --    < > = values in this interval not displayed.   Liver Function Tests: Recent Labs  Lab 09/10/19 1519 09/11/19 0406 09/12/19 0440  AST 49* 16 14*  ALT 19 14 12   ALKPHOS 99 93 90  BILITOT 1.6* 0.9 0.5  PROT 5.7* 5.6* 4.7*  ALBUMIN 2.1* 1.9* 1.7*   CBC: Recent Labs  Lab 09/06/19 0742 09/09/19 1555 09/10/19 1519 09/11/19 0406 09/12/19 0440  WBC 10.0 21.8*  12.2* 11.6* 11.9*  NEUTROABS  --  19,337* 9.8*  --  10.0*  HGB 11.6* 10.9* 10.4* 10.6* 8.7*  HCT 35.9* 34.8* 32.0* 33.0* 27.7*  MCV 95.5 95.9 96.4 94.8 95.2  PLT 364 415* 308 287 285   Blood Culture    Component Value Date/Time   SDES BLOOD LEFT HAND 09/11/2019 0348   SPECREQUEST  09/11/2019 0348    AEROBIC BOTTLE ONLY Blood Culture results may not be optimal due to an inadequate volume of blood received in culture bottles   CULT  09/11/2019 0348    NO GROWTH 1 DAY Performed at [facility], [place] [address], [place], [place] [address]    REPTSTATUS PENDING 09/11/2019 0348    CBG: Recent Labs  Lab 09/11/19 0626 09/11/19 1157 09/11/19 1658 09/11/19 2148 09/12/19 0608  GLUCAP 112* 102* 93 124* 170*   Studies/Results: Dg Abd Acute 2+v W 1v Chest  Result Date: 09/10/2019 CLINICAL DATA:  Concern for small bowel obstruction EXAM: DG ABDOMEN ACUTE W/ 1V CHEST COMPARISON:  Abdominal radiograph dated 09/05/2019 FINDINGS:  There is no evidence of dilated bowel loops or free intraperitoneal air. Degenerative changes are seen in the left hip and lumbar spine. A right hip arthroplasty is seen. A peritoneal dialysis catheter is noted in the pelvis. A calcified fibroid is redemonstrated. Heart size and mediastinal contours are within normal limits. Vascular calcifications are seen in the aortic arch. Both lungs are clear. IMPRESSION: No evidence of bowel obstruction or free intraperitoneal air. No acute cardiopulmonary findings. Aortic Atherosclerosis (ICD10-I70.0). Electronically Signed   By: Zerita Boers M.D.   On: 09/10/2019 17:20    Medications: . sodium chloride 75 mL/hr at 09/12/19 0025  . dialysis solution 1.5% low-MG/low-CA    . magnesium sulfate bolus IVPB 4 g (09/12/19 1017)  . potassium chloride 10 mEq (09/12/19 1018)   . allopurinol  300 mg Oral QODAY  . aspirin  81 mg Oral QHS  . cinacalcet  30 mg Oral Q breakfast  . feeding supplement  1 Container Oral BID BM  .  feeding supplement (NEPRO CARB STEADY)  237 mL Oral QHS  . feeding supplement (PRO-STAT SUGAR FREE 64)  30 mL Oral TID WC  . gentamicin cream  1 application Topical Daily  . heparin  5,000 Units Subcutaneous Q8H  . insulin aspart  0-9 Units Subcutaneous TID WC  . multivitamin  1 tablet Oral QHS  . pantoprazole (PROTONIX) IV  40 mg Intravenous Q12H  . rosuvastatin  5 mg Oral QPC supper  . sevelamer carbonate  800 mg Oral TID WC  . sucralfate  1 g Oral TID WC & HS  . vancomycin  500 mg Oral Q6H    Dialysis Orders: CCPD - 7x wk EDW 48.5kg 5 exchanges Fill vol 257mL, Dwell time 2hrs, No day dwell Uses 1.5% solution  Assessment/Plan: 1. C. Diff - C diff antigen +. improving per patient. Per primary 2. Leukocytosis: improved.  BC with NGTD. 3. Intractable n/v - multifactorial.  No SBO on abdominal x-ray. Carafate started, use with caution in ESRD patient, can only be used for a short period of time due to risks of aluminum toxicity. Per primary  4. Hypokalemia - K 2.7. Given IV K this AM, continue daily supplements, liberalize diet and encouraged nutrition.  5. Hypomagnesemia - Mg 1.7 today.  IV repletion ordered.  6. ESRD - on PD.  Orders written for PD using 1.5% solution. 7. 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 No indication for ESA at this time. 8. Secondary hyperparathyroidism - Ca elevated, CCa 10.8.  Phos elevated. No VDRA, use low Ca bath when able. Continue sensipar and binders. 9. Hypotension/volume - BP improved today, continue NS 34mL/hr for now to off set GI losses. Continue using 1.5% solution with PD.  Not on antihypertensives.  10. Nutrition - Alb 1.7. Liberalized diet due to low K.  Encouraged nutrition.   Jen Mow, PA-C [facility] Pager: [phone] 09/12/2019,11:37 AM  LOS: 1 day   Pt seen, examined and agree w A/P as above.  Kelly Splinter  MD 09/12/2019, 3:02 PM

## 2019-09-12 NOTE — Progress Notes (Signed)
RN notified Dr. Kennon Holter via Shea Evans of pt critical lab value for Potassium 2.7.

## 2019-09-13 DIAGNOSIS — Z4932 Encounter for adequacy testing for peritoneal dialysis: Secondary | ICD-10-CM | POA: Diagnosis not present

## 2019-09-13 DIAGNOSIS — K208 Other esophagitis without bleeding: Secondary | ICD-10-CM

## 2019-09-13 DIAGNOSIS — E876 Hypokalemia: Secondary | ICD-10-CM | POA: Diagnosis not present

## 2019-09-13 DIAGNOSIS — K2900 Acute gastritis without bleeding: Secondary | ICD-10-CM

## 2019-09-13 DIAGNOSIS — N2589 Other disorders resulting from impaired renal tubular function: Secondary | ICD-10-CM | POA: Diagnosis not present

## 2019-09-13 DIAGNOSIS — N186 End stage renal disease: Secondary | ICD-10-CM | POA: Diagnosis not present

## 2019-09-13 DIAGNOSIS — D631 Anemia in chronic kidney disease: Secondary | ICD-10-CM | POA: Diagnosis not present

## 2019-09-13 DIAGNOSIS — Z992 Dependence on renal dialysis: Secondary | ICD-10-CM | POA: Diagnosis not present

## 2019-09-13 DIAGNOSIS — E119 Type 2 diabetes mellitus without complications: Secondary | ICD-10-CM | POA: Diagnosis not present

## 2019-09-13 DIAGNOSIS — R82998 Other abnormal findings in urine: Secondary | ICD-10-CM | POA: Diagnosis not present

## 2019-09-13 DIAGNOSIS — Z23 Encounter for immunization: Secondary | ICD-10-CM | POA: Diagnosis not present

## 2019-09-13 DIAGNOSIS — N2581 Secondary hyperparathyroidism of renal origin: Secondary | ICD-10-CM | POA: Diagnosis not present

## 2019-09-13 DIAGNOSIS — D509 Iron deficiency anemia, unspecified: Secondary | ICD-10-CM | POA: Diagnosis not present

## 2019-09-13 LAB — BASIC METABOLIC PANEL
Anion gap: 14 (ref 5–15)
BUN: 66 mg/dL — ABNORMAL HIGH (ref 8–23)
CO2: 19 mmol/L — ABNORMAL LOW (ref 22–32)
Calcium: 8.7 mg/dL — ABNORMAL LOW (ref 8.9–10.3)
Chloride: 103 mmol/L (ref 98–111)
Creatinine, Ser: 7.04 mg/dL — ABNORMAL HIGH (ref 0.44–1.00)
GFR calc Af Amer: 6 mL/min — ABNORMAL LOW (ref >60)
GFR calc non Af Amer: 5 mL/min — ABNORMAL LOW (ref >60)
Glucose, Bld: 132 mg/dL — ABNORMAL HIGH (ref 70–99)
Potassium: 3.1 mmol/L — ABNORMAL LOW (ref 3.5–5.1)
Sodium: 136 mmol/L (ref 135–145)

## 2019-09-13 LAB — GLUCOSE, CAPILLARY
Glucose-Capillary: 186 mg/dL — ABNORMAL HIGH (ref 70–99)
Glucose-Capillary: 139 mg/dL — ABNORMAL HIGH (ref 70–99)
Glucose-Capillary: 90 mg/dL (ref 70–99)
Glucose-Capillary: 135 mg/dL — ABNORMAL HIGH (ref 70–99)

## 2019-09-13 MED ORDER — ALUM & MAG HYDROXIDE-SIMETH 200-200-20 MG/5ML PO SUSP
15.0000 mL | Freq: Three times a day (TID) | ORAL | Status: DC
  Administered 2019-09-13 – 2019-09-14 (×3): 15 mL via ORAL
  Filled 2019-09-13 (×3): qty 30

## 2019-09-13 MED ORDER — ENSURE ENLIVE PO LIQD
237.0000 mL | Freq: Two times a day (BID) | ORAL | Status: DC
  Administered 2019-09-13 – 2019-09-14 (×3): 237 mL via ORAL

## 2019-09-13 MED ORDER — LIDOCAINE VISCOUS HCL 2 % MT SOLN: 15.0000 mL | Freq: Three times a day (TID) | OROMUCOSAL | Status: DC

## 2019-09-13 MED ORDER — VANCOMYCIN 50 MG/ML ORAL SOLUTION
125.0000 mg | Freq: Four times a day (QID) | ORAL | Status: DC
  Administered 2019-09-13 – 2019-09-14 (×4): 125 mg via ORAL
  Filled 2019-09-13 (×5): qty 2.5

## 2019-09-13 MED ORDER — PANTOPRAZOLE SODIUM 40 MG PO TBEC
40.0000 mg | DELAYED_RELEASE_TABLET | Freq: Two times a day (BID) | ORAL | Status: DC
  Administered 2019-09-13 – 2019-09-14 (×2): 40 mg via ORAL
  Filled 2019-09-13 (×2): qty 1

## 2019-09-13 MED ORDER — LIDOCAINE VISCOUS HCL 2 % MT SOLN
15.0000 mL | Freq: Three times a day (TID) | OROMUCOSAL | Status: DC
  Administered 2019-09-13 – 2019-09-14 (×3): 15 mL via ORAL
  Filled 2019-09-13 (×4): qty 15

## 2019-09-13 MED ORDER — ALUM & MAG HYDROXIDE-SIMETH 200-200-20 MG/5ML PO SUSP: 30.0000 mL | Freq: Three times a day (TID) | ORAL | Status: DC

## 2019-09-13 MED ORDER — POTASSIUM CHLORIDE CRYS ER 20 MEQ PO TBCR
40.0000 meq | EXTENDED_RELEASE_TABLET | ORAL | Status: AC
  Administered 2019-09-13 (×2): 40 meq via ORAL
  Filled 2019-09-13 (×3): qty 2

## 2019-09-13 NOTE — Evaluation (Signed)
Physical Therapy Evaluation Patient Details Name: Eileen Perez MRN: [id number] DOB: Feb 25, 1941 Today's Date: 09/13/2019   History of Present Illness  Pt is a 78 y.o. female admitted 09/10/19 with nausea/vomiting and hypotension; likely related to volume depletion. Of note, recent admission 08/2019 with partial SBO and d/c home last week. EGD done 10/13 with evidence of esophagitis and diffuse gastritis. PMH includes ESRD (on peritoneal dialysis at home), arthritis, gastric carcinoid tumor, HTN, HLD.    Clinical Impression  Pt presents with an overall decrease in functional mobility secondary to above. PTA, pt recently d/c home to start with HHPT, children have been providing increased assist as needed; ambulatory with rollator. Today, pt mod indep with bed mobility; ambulation limited by peritoneal dialysis. Pt planning to return home with HHPT and children's assist. Encouraged ambulation later today with nursing staff. Will follow acutely to address established goals.     Follow Up Recommendations Home health PT;Supervision for mobility/OOB    Equipment Recommendations  None recommended by PT    Recommendations for Other Services       Precautions / Restrictions Precautions Precautions: Fall Restrictions Weight Bearing Restrictions: No      Mobility  Bed Mobility Overal bed mobility: Modified Independent             General bed mobility comments: Indep to roll R/L with use of bed rail  Transfers                 General transfer comment: Limited by peritoneal dialysis  Ambulation/Gait                Stairs            Wheelchair Mobility    Modified Rankin (Stroke Patients Only)       Balance                                             Pertinent Vitals/Pain Pain Assessment: Faces Faces Pain Scale: Hurts little more Pain Location: Generalized Pain Descriptors / Indicators: Discomfort Pain Intervention(s): Monitored  during session    Home Living Family/patient expects to be discharged to:: Private residence Living Arrangements: Alone Available Help at Discharge: Family;Available PRN/intermittently Type of Home: House Home Access: Stairs to enter   Entrance Stairs-Number of Steps: 1 Home Layout: One level Home Equipment: Walker - 4 wheels;Bedside commode Additional Comments: Multiple children to rotate providing care as needed    Prior Function Level of Independence: Needs assistance   Gait / Transfers Assistance Needed: Ambulation with rollator. On peritoneal dialysis at home  ADL's / Homemaking Assistance Needed: Daughter assisting with bathing (son will be able to help)  Comments: Pt prepares simple meals for self. Uses [employer] for HD transportation.     Hand Dominance        Extremity/Trunk Assessment   Upper Extremity Assessment Upper Extremity Assessment: Overall WFL for tasks assessed    Lower Extremity Assessment Lower Extremity Assessment: Generalized weakness    Cervical / Trunk Assessment Cervical / Trunk Assessment: Kyphotic  Communication   Communication: No difficulties  Cognition Arousal/Alertness: Awake/alert Behavior During Therapy: WFL for tasks assessed/performed Overall Cognitive Status: Within Functional Limits for tasks assessed  General Comments      Exercises     Assessment/Plan    PT Assessment Patient needs continued PT services  PT Problem List Decreased mobility;Decreased activity tolerance;Decreased balance;Decreased strength;Cardiopulmonary status limiting activity       PT Treatment Interventions DME instruction;Therapeutic activities;Gait training;Therapeutic exercise;Patient/family education;Stair training;Balance training;Functional mobility training    PT Goals (Current goals can be found in the Care Plan section)  Acute Rehab PT Goals Patient Stated Goal: go home PT Goal  Formulation: With patient Time For Goal Achievement: 09/27/19 Potential to Achieve Goals: Good    Frequency Min 3X/week   Barriers to discharge        Co-evaluation               AM-PAC PT "6 Clicks" Mobility  Outcome Measure Help needed turning from your back to your side while in a flat bed without using bedrails?: None Help needed moving from lying on your back to sitting on the side of a flat bed without using bedrails?: A Little Help needed moving to and from a bed to a chair (including a wheelchair)?: A Little Help needed standing up from a chair using your arms (e.g., wheelchair or bedside chair)?: A Little Help needed to walk in hospital room?: A Little Help needed climbing 3-5 steps with a railing? : A Little 6 Click Score: 19    End of Session   Activity Tolerance: Other (comment)(limited by PD) Patient left: in bed;with call bell/phone within reach;with bed alarm set Nurse Communication: Mobility status PT Visit Diagnosis: Difficulty in walking, not elsewhere classified (R26.2)    Time: 4888-9169 PT Time Calculation (min) (ACUTE ONLY): 16 min   Charges:   PT Evaluation $PT Eval Moderate Complexity: 1 Mod     Mabeline Caras, PT, DPT Acute Rehabilitation Services  Pager [phone] Office [place] 09/13/2019, 10:47 AM

## 2019-09-13 NOTE — Progress Notes (Addendum)
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 Also with intractable nausea, vomiting, and multiple episodes of watery, nonbloody diarrhea since discharge. No recent antibiotic use, fevers or abdominal pain. In the ED, BP 60/40 per EMS and improved to 90/50 after 500 cc normal saline bolus. Not tachycardic or febrile. White blood cell count 12.2, down from 21.8 just the day before. Lactic acid 2.8 >2.9 which normalized after IV hydration.Blood cultures no growth x2 days.Stool studies obtained and patient C. difficilepositive. GI pathogen panel pending. Abdominal x-ray showing no evidence of bowel obstruction or free intraperitoneal air.  Admitted to telemetry, started on therapy for fulminant C. difficile infection with oral vancomycin and IV metronidazole, given her hypotension. Infectious disease consulted.  Patient's bowel movements improved by day 2 of admission, and became formed by day 3.  Hypotension resolved after IV fluids, and now holding off fluids.  Nephrology also following for peritoneal dialysis during her stay.  Of note, patient followed by gastroenterology with Dr. Alessandra Bevels.  Had EGD on 10/13 which showed esophagitis, gastritis and a non-obstructing Schatzki ring.  She is taking Protonix 40 mg BID, and Carafate.  Patient reports ongoing difficulty with swallowing, and not tolerating solid  foods.  Reports Carafate not very helpful and has made her nauseous at times.  Discussed with on-call GI.  Will stop Carafate, as it can cause aluminum toxicity in ESRD patients.  Started GI cocktail with viscous lidocaine.  Subjective: Patient seen and examined sitting up in bed and eating breakfast this morning.  Continues to report difficulty with anything but very soft bland foods due to pain and difficulty swallowing.  Reports no issue with appetite but pain and frequent nausea with swallowing solid foods, stating "just do not want those foods in my throat".  Reports diarrhea resolved, had 3 formed BMs yesterday.  Denies fevers or chills.  Assessment & Plan:   Principal Problem:   Hypotension Active Problems:   Hypomagnesemia   ESRD (end stage renal disease) (HCC)   Intractable nausea and vomiting   Diarrhea   Pressure injury of skin   Clostridioides difficile infection   Dehydration   Fulminant C. difficile infection - improving.  C. difficile antigen and toxin positive on admission. Given hypotension this is consideredfulminant CDI.  Risk factors include recent hospital admission for SBO, PPI use.  No recent antibiotics.  Diarrhea improved by day 2, formed stools by day 3 of admission. -Infectious disease consulted -continue high dose oral vancomycin 500 mgq6h x 14 days  Hypomagnesemia -improved.  Recurrent in setting of diarrhea. Mg normal at 2.1 this AM. - Monitor & replete as needed - Maintain Mg>2  Hypokalemia - in setting of diarrhea/GI loss. K 3.0 this AM. - repleted by PO - re-check this afternoon - continue monitoring & replete as needed for goal K >=4  Hypotension-resolved. Secondary to hypovolemia from diarrhea and vomiting. Doubt sepsis given no fever or tachycardia.Leukocytosis significantly improved 11.9 from 21.82 on 10/20. Lactate elevated at 2.8,2.9 likely secondary to hypovolemia, resolved with fluids. Doubt peritonitis given no abdominal pain  or fever present on admission but will monitor closely given risk factor of peritoneal dialysis. No evidence of acute blood loss. Blood pressure improved with IV fluid and is now stable off fluids. However I do not remember no problem -monitor vitals closely - maintain MAP > 65 -Will give additional fluid boluses as needed - Blood culture x2 pending - no growth x2 days  Lactic acidosis-resolved. Secondary to hypovolemia in the setting of ongoing vomiting and diarrhea.  Intractable nausea and vomiting-improved.  Likely multifactorialin the setting of C. difficile infection,esophagitis and diffuse gastritis seen on EGD 10/13,as well as knowncarcinoid tumor of the stomach. No abdominal painreported and nontender on exam.Of note, recently admitted for SBO, however abdominal x-rayon admission showed no evidence of bowel obstruction.  Patient reported little relief, and often nausea after taking Carafate.  When I had stopped Carafate as patient is ESRD and this can cause aluminum toxicity.  Will try GI cocktail with viscous lidocaine instead.  Case discussed with on-call gastroenterologist.  Patient has close follow-up with Dr. Alessandra Bevels and no indication for inpatient procedures given recent EGD.   - IV Zofran as needed - d/c IV Protonix  - resume home PO Protonix 40 mg BID - GI cocktail with viscous lidocaine TID before meals  Diarrhea-resolved.  Secondary to C. difficile infection. Stools now formed.  -follow upGI pathogen panel   ESRD on peritoneal dialysis -Nephrology following and has placed orders for peritoneal dialysis  Mildly elevated LFTs - resolved, now WNL. On admission: T bili 1.6, AST 49. ALT and alk phos normal. History of cholecystectomy. - monitor  History of prediabetes -Sliding scale insulin sensitive and CBG checks.  Stage 2 pressure injury coccyx POA - wound care following  DVT prophylaxis:Heparin Code Status:Code Status: Full Code  Family Communication:Daughter, [name], updated by phone today 10/24 and agrees with the plan.  She is to be primary contact person for all updates.   Disposition Plan:Expect d/c home tomorrow, 10/25.  Patient already has [place] home health services arranged, and will have son or daughter staying with her.  Consultants:  Infectious disease  Nephrology  Procedures:  Peritoneal dialysis per nephrology  Antimicrobials:  Flagyl IV, start 10/22  VancomycinPO, start 10/22  Objective: Vitals:   09/12/19 1955 09/13/19 0014 09/13/19 0642 09/13/19 0935  BP: 127/68 123/66 116/75 129/68  Pulse: 86 81 93 85  Resp: _0 Temp: 98.1 F (36.7 C) 98 F (36.7 C) 98.3 F (36.8 C) 98 F (36.7 C)  TempSrc: Oral Oral Oral Oral  SpO2: 99% 100% 100% 100%  Weight:    50.8 kg  Height:        Intake/Output Summary (Last 24 hours) at 09/13/2019 1205 Last data filed at 09/13/2019 0500 Gross per 24 hour  Intake 1407.09 ml  Output 1 ml  Net 1406.09 ml   Filed Weights   09/12/19 1001 09/12/19 1840 09/13/19 0935  Weight: 51 kg 50.5 kg 50.8 kg    Examination:  General exam: awake, alert, no acute distress Respiratory system: clear to auscultation bilaterally, no wheezes, rales or rhonchi, normal respiratory effort. Cardiovascular system: normal S1/S2, RRR, no JVD, murmurs, rubs, gallops, no pedal edema.   Gastrointestinal system: PD catheter in place, soft, non-tender, non-distended abdomen, normal bowel sounds. Central nervous system: alert and oriented x4. no gross focal neurologic deficits, normal speech Extremities: moves all, no edema, normal tone Skin: dry, intact, normal temperature Psychiatry: normal mood, congruent affect, judgement and insight appear normal    Data  Reviewed: I have personally reviewed following labs and imaging studies  CBC: Recent Labs  Lab 09/09/19 1555 09/10/19 1519 09/11/19 0406 09/12/19 0440  WBC 21.8* 12.2* 11.6* 11.9*  NEUTROABS  19,337* 9.8*  --  10.0*  HGB 10.9* 10.4* 10.6* 8.7*  HCT 34.8* 32.0* 33.0* 27.7*  MCV 95.9 96.4 94.8 95.2  PLT 415* 308 287 654   Basic Metabolic Panel: Recent Labs  Lab 09/10/19 1519 09/11/19 0406 09/11/19 1611 09/12/19 0440 09/12/19 1551  NA 134* 137 137 135 135  K 4.2 3.0* 3.4* 2.7* 3.0*  CL 92* 97* 97* 100 99  CO2 18* 18* 19* 16* 19*  GLUCOSE 187* 114* 99 157* 116*  BUN 79* 86* 91* 78* 77*  CREATININE 7.58* 7.45* 7.81* 7.06* 7.23*  CALCIUM 9.2 9.2 9.2 9.0 8.9  MG 1.6*  --  1.7 1.6* 2.1  PHOS 6.0*  --  5.9*  --   --    GFR: Estimated Creatinine Clearance: 4.8 mL/min (A) (by C-G formula based on SCr of 7.23 mg/dL (H)). Liver Function Tests: Recent Labs  Lab 09/09/19 1555 09/10/19 1519 09/11/19 0406 09/12/19 0440  AST 10 49* 16 14*  ALT _0 ALKPHOS  --  99 93 90  BILITOT 0.5 1.6* 0.9 0.5  PROT 6.0* 5.7* 5.6* 4.7*  ALBUMIN  --  2.1* 1.9* 1.7*   No results for input(s): LIPASE, AMYLASE in the last 168 hours. No results for input(s): AMMONIA in the last 168 hours. Coagulation Profile: No results for input(s): INR, PROTIME in the last 168 hours. Cardiac Enzymes: No results for input(s): CKTOTAL, CKMB, CKMBINDEX, TROPONINI in the last 168 hours. BNP (last 3 results) No results for input(s): PROBNP in the last 8760 hours. HbA1C: No results for input(s): HGBA1C in the last 72 hours. CBG: Recent Labs  Lab 09/12/19 0608 09/12/19 1140 09/12/19 1632 09/12/19 2055 09/13/19 0621  GLUCAP 170* 124* 107* 131* 186*   Lipid Profile: No results for input(s): CHOL, HDL, LDLCALC, TRIG, CHOLHDL, LDLDIRECT in the last 72 hours. Thyroid Function Tests: No results for input(s): TSH, T4TOTAL, FREET4, T3FREE, THYROIDAB in the last 72 hours. Anemia Panel: No results for input(s): VITAMINB12, FOLATE, FERRITIN, TIBC, IRON, RETICCTPCT in the last 72 hours. Sepsis Labs: Recent Labs  Lab 09/10/19 1519 09/10/19 1719 09/11/19 0406 09/11/19 1625  LATICACIDVEN 2.8* 2.9*  3.0* 1.3    Recent Results (from the past 240 hour(s))  Blood culture (routine x 2)     Status: None (Preliminary result)   Collection Time: 09/10/19  1:52 AM   Specimen: BLOOD RIGHT FOREARM  Result Value Ref Range Status   Specimen Description BLOOD RIGHT FOREARM  Final   Special Requests   Final    BOTTLES DRAWN AEROBIC AND ANAEROBIC Blood Culture results may not be optimal due to an inadequate volume of blood received in culture bottles   Culture   Final    NO GROWTH 2 DAYS Performed at [facility], [place] [address] [place], [place] [address]    Report Status PENDING  Incomplete  C difficile quick scan w PCR reflex     Status: Abnormal   Collection Time: 09/10/19 10:05 PM   Specimen: STOOL  Result Value Ref Range Status   C Diff antigen POSITIVE (A) NEGATIVE Final    Comment: CRITICAL RESULT CALLED TO, READ BACK BY AND VERIFIED WITH: Harvest Dark RN 09/11/19 0112 JDW    C Diff toxin POSITIVE (A) NEGATIVE Final   C Diff  interpretation Toxin producing C. difficile detected.  Final    Comment: Performed at [facility], [place] [address] [place], [place] [address]  SARS CORONAVIRUS 2 (TAT 6-24 HRS) Nasopharyngeal Nasopharyngeal Swab     Status: None   Collection Time: 09/11/19 12:25 AM   Specimen: Nasopharyngeal Swab  Result Value Ref Range Status   SARS Coronavirus 2 NEGATIVE NEGATIVE Final    Comment: (NOTE) SARS-CoV-2 target nucleic acids are NOT DETECTED. The SARS-CoV-2 RNA is generally detectable in upper and lower respiratory specimens during the acute phase of infection. Negative results do not preclude SARS-CoV-2 infection, do not rule out co-infections with other pathogens, and should not be used as the sole basis for treatment or other patient management decisions. Negative results must be combined with clinical observations, patient history, and epidemiological information. The expected result is Negative. Fact Sheet for Patients:  SugarRoll.be Fact Sheet for Healthcare Providers: https://www.woods-mathews.com/ This test is not yet approved or cleared by the [place] FDA and  has been authorized for detection and/or diagnosis of SARS-CoV-2 by FDA under an Emergency Use Authorization (EUA). This EUA will remain  in effect (meaning this test can be used) for the duration of the COVID-19 declaration under Section 56 4(b)(1) of the Act, 21 U.S.C. section 360bbb-3(b)(1), unless the authorization is terminated or revoked sooner. Performed at [facility], [place] [address] [place], [place] [address]   Blood culture (routine x 2)     Status: None (Preliminary result)   Collection Time: 09/11/19  3:48 AM   Specimen: BLOOD LEFT HAND  Result Value Ref Range Status   Specimen Description BLOOD LEFT HAND  Final   Special Requests   Final    AEROBIC BOTTLE ONLY Blood Culture results may not be optimal due to an inadequate volume of blood received in culture bottles   Culture   Final    NO GROWTH 2 DAYS Performed at [facility], [place] [address] [place], [place] [address]    Report Status PENDING  Incomplete         Radiology Studies: No results found.      Scheduled Meds: . allopurinol  300 mg Oral QODAY  . aspirin  81 mg Oral QHS  . cinacalcet  30 mg Oral Q breakfast  . feeding supplement (ENSURE ENLIVE)  237 mL Oral BID BM  . feeding supplement (PRO-STAT SUGAR FREE 64)  30 mL Oral TID WC  . gentamicin cream  1 application Topical Daily  . heparin  5,000 Units Subcutaneous Q8H  . insulin aspart  0-9 Units Subcutaneous TID WC  . multivitamin  1 tablet Oral QHS  . pantoprazole (PROTONIX) IV  40 mg Intravenous Q12H  . potassium chloride  40 mEq Oral Q4H  . rosuvastatin  5 mg Oral QPC supper  . sevelamer carbonate  800 mg Oral TID WC  . sucralfate  1 g Oral TID WC & HS  . vancomycin  500 mg Oral Q6H   Continuous Infusions: . sodium chloride 10  mL/hr at 09/13/19 0107  . dialysis solution 1.5% low-MG/low-CA       LOS: 2 days    Time spent: 30-35 min    Ezekiel Slocumb, DO [facility] Pager: [phone]  If 7PM-7AM, please contact night-coverage [email or web address] Password [email or web address] 09/13/2019, 12:05 PM

## 2019-09-13 NOTE — Progress Notes (Addendum)
[facility] Progress Note   Dialysis Orders: CCPD - 7x wk EDW 48.5kg 5 exchanges Fill vol 2591mL, Dwell time 2hrs, No day dwell Uses 1.5% solution  Assessment/Plan: 1.C. Diff - C diff antigen +.improving per patient.Per primary 2. Leukocytosis: improved.  BC with NGTD. 3. Intractable n/v - multifactorial. No SBO on abdominal x-ray. Carafate started, use with caution in ESRD patient, can only be used for a short period of time due to risks of aluminum toxicity. Per primary  4. Hypokalemia - K 2.7 > 3  continue daily supplements- 40 bid today., liberalized diet and encouraged ^ K selection of foods. 5. Hypomagnesemia - 2.1 s/p course of IV Mg 6. ESRD -on PD. Orders written for PD using 1.5% solution. 7. Anemia of CKD-Hgb 10.6. > 8.7 - not clear why she would have a 2 gm drop - repeat tomorrow - start ESA and check Fe stores if remains low  8. Secondary hyperparathyroidism -Corr Ca elevated,  Phos elevated. No VDRA, use low Ca bath when able. Continue sensipar and binders. 9. Hypotension/volume -BP improved today,  Continue using 1.5% solution with PD. Not on antihypertensives.  10. Nutrition - Alb 1.7. -try Ensure Enlive - higher in [place] 09/13/2019,8:49 AM  LOS: 2 days   I have seen and examined this patient and agree with the plan of care. Seen on PD; 1.5%. Cannot locate the UF under CCPD flowsheets but she appears to be euvolemic and tolerating treatments.  VSS 5 exchanges on CCPD. OVerall appetite still not good but she's feeling better than yesterday.  Dwana Melena, MD 09/13/2019, 10:54 AM  Subjective:   Poor appetite - can't tolerate prostat or nepro - will try Ensure which she likes Diarrhea better.  Likes alone but has family to help for the next couple weeks.   Objective Vitals:   09/12/19 1840 09/12/19 1955 09/13/19 0014 09/13/19 0642  BP:  127/68 123/66 116/75  Pulse:  86 81  93  Resp:  17 16 18   Temp:  98.1 F (36.7 C) 98 F (36.7 C) 98.3 F (36.8 C)  TempSrc:  Oral Oral Oral  SpO2:  99% 100% 100%  Weight: 50.5 kg     Height:       Physical Exam General: NAD frail Heart: RRR Lungs: no rales Abdomen: soft NT Extremities: no LE edema Dialysis Access:  PD cath   Additional Objective Labs: Basic Metabolic Panel: Recent Labs  Lab 09/10/19 1519  09/11/19 1611 09/12/19 0440 09/12/19 1551  NA 134*   < > 137 135 135  K 4.2   < > 3.4* 2.7* 3.0*  CL 92*   < > 97* 100 99  CO2 18*   < > 19* 16* 19*  GLUCOSE 187*   < > 99 157* 116*  BUN 79*   < > 91* 78* 77*  CREATININE 7.58*   < > 7.81* 7.06* 7.23*  CALCIUM 9.2   < > 9.2 9.0 8.9  PHOS 6.0*  --  5.9*  --   --    < > = values in this interval not displayed.   Liver Function Tests: Recent Labs  Lab 09/10/19 1519 09/11/19 0406 09/12/19 0440  AST 49* 16 14*  ALT 19 14 12   ALKPHOS 99 93 90  BILITOT 1.6* 0.9 0.5  PROT 5.7* 5.6* 4.7*  ALBUMIN 2.1* 1.9* 1.7*   No results for input(s): LIPASE, AMYLASE in the last 168 hours.  CBC: Recent Labs  Lab 09/09/19 1555 09/10/19 1519 09/11/19 0406 09/12/19 0440  WBC 21.8* 12.2* 11.6* 11.9*  NEUTROABS 19,337* 9.8*  --  10.0*  HGB 10.9* 10.4* 10.6* 8.7*  HCT 34.8* 32.0* 33.0* 27.7*  MCV 95.9 96.4 94.8 95.2  PLT 415* 308 287 285   Blood Culture    Component Value Date/Time   SDES BLOOD LEFT HAND 09/11/2019 0348   SPECREQUEST  09/11/2019 0348    AEROBIC BOTTLE ONLY Blood Culture results may not be optimal due to an inadequate volume of blood received in culture bottles   CULT  09/11/2019 0348    NO GROWTH 2 DAYS Performed at [facility] [address], [place], [place] [address]    REPTSTATUS PENDING 09/11/2019 [phone]    Cardiac Enzymes: No results for input(s): CKTOTAL, CKMB, CKMBINDEX, TROPONINI in the last 168 hours. CBG: Recent Labs  Lab 09/12/19 0608 09/12/19 1140 09/12/19 1632 09/12/19 2055 09/13/19 0621  GLUCAP 170* 124*  107* 131* 186*   Iron Studies: No results for input(s): IRON, TIBC, TRANSFERRIN, FERRITIN in the last 72 hours. Lab Results  Component Value Date   INR 1.09 02/15/2017   INR 1.06 05/19/2015   INR 0.99 06/02/2013   Studies/Results: No results found. Medications: . sodium chloride 10 mL/hr at 09/13/19 0107  . dialysis solution 1.5% low-MG/low-CA     . allopurinol  300 mg Oral QODAY  . aspirin  81 mg Oral QHS  . cinacalcet  30 mg Oral Q breakfast  . feeding supplement  1 Container Oral BID BM  . feeding supplement (NEPRO CARB STEADY)  237 mL Oral QHS  . feeding supplement (PRO-STAT SUGAR FREE 64)  30 mL Oral TID WC  . gentamicin cream  1 application Topical Daily  . heparin  5,000 Units Subcutaneous Q8H  . insulin aspart  0-9 Units Subcutaneous TID WC  . multivitamin  1 tablet Oral QHS  . pantoprazole (PROTONIX) IV  40 mg Intravenous Q12H  . potassium chloride  40 mEq Oral Q4H  . rosuvastatin  5 mg Oral QPC supper  . sevelamer carbonate  800 mg Oral TID WC  . sucralfate  1 g Oral TID WC & HS  . vancomycin  500 mg Oral Q6H

## 2019-09-13 NOTE — Progress Notes (Signed)
ID PROGRESS NOTE  78yo F with cdifficile sepsis, no day 3 of treatment for severe cdifficile  S: Patient had 3 formed stools yesterday, afebrile, leukocytosis resolved yesterday   Plan: Recommend to treat with oral vancomycin 125mg  QID x 10 days excluding what she has received up til now.  Will sign off.  Elzie Rings [place] [facility] [phone]

## 2019-09-14 DIAGNOSIS — N2581 Secondary hyperparathyroidism of renal origin: Secondary | ICD-10-CM | POA: Diagnosis not present

## 2019-09-14 DIAGNOSIS — D631 Anemia in chronic kidney disease: Secondary | ICD-10-CM | POA: Diagnosis not present

## 2019-09-14 DIAGNOSIS — N186 End stage renal disease: Secondary | ICD-10-CM | POA: Diagnosis not present

## 2019-09-14 DIAGNOSIS — E876 Hypokalemia: Secondary | ICD-10-CM | POA: Diagnosis not present

## 2019-09-14 DIAGNOSIS — Z992 Dependence on renal dialysis: Secondary | ICD-10-CM | POA: Diagnosis not present

## 2019-09-14 DIAGNOSIS — Z4932 Encounter for adequacy testing for peritoneal dialysis: Secondary | ICD-10-CM | POA: Diagnosis not present

## 2019-09-14 DIAGNOSIS — Z23 Encounter for immunization: Secondary | ICD-10-CM | POA: Diagnosis not present

## 2019-09-14 DIAGNOSIS — E119 Type 2 diabetes mellitus without complications: Secondary | ICD-10-CM | POA: Diagnosis not present

## 2019-09-14 DIAGNOSIS — N2589 Other disorders resulting from impaired renal tubular function: Secondary | ICD-10-CM | POA: Diagnosis not present

## 2019-09-14 DIAGNOSIS — R82998 Other abnormal findings in urine: Secondary | ICD-10-CM | POA: Diagnosis not present

## 2019-09-14 DIAGNOSIS — D509 Iron deficiency anemia, unspecified: Secondary | ICD-10-CM | POA: Diagnosis not present

## 2019-09-14 LAB — BASIC METABOLIC PANEL
Anion gap: 15 (ref 5–15)
BUN: 61 mg/dL — ABNORMAL HIGH (ref 8–23)
CO2: 19 mmol/L — ABNORMAL LOW (ref 22–32)
Calcium: 8.5 mg/dL — ABNORMAL LOW (ref 8.9–10.3)
Chloride: 102 mmol/L (ref 98–111)
Creatinine, Ser: 6.99 mg/dL — ABNORMAL HIGH (ref 0.44–1.00)
GFR calc Af Amer: 6 mL/min — ABNORMAL LOW (ref >60)
GFR calc non Af Amer: 5 mL/min — ABNORMAL LOW (ref >60)
Glucose, Bld: 162 mg/dL — ABNORMAL HIGH (ref 70–99)
Potassium: 3.6 mmol/L (ref 3.5–5.1)
Sodium: 136 mmol/L (ref 135–145)

## 2019-09-14 LAB — GI PATHOGEN PANEL BY PCR, STOOL

## 2019-09-14 LAB — GLUCOSE, CAPILLARY
Glucose-Capillary: 174 mg/dL — ABNORMAL HIGH (ref 70–99)
Glucose-Capillary: 94 mg/dL (ref 70–99)

## 2019-09-14 LAB — CBC
HCT: 29.5 % — ABNORMAL LOW (ref 36.0–46.0)
Hemoglobin: 9.5 g/dL — ABNORMAL LOW (ref 12.0–15.0)
MCH: 30.2 pg (ref 26.0–34.0)
MCHC: 32.2 g/dL (ref 30.0–36.0)
MCV: 93.7 fL (ref 80.0–100.0)
Platelets: 233 10*3/uL (ref 150–400)
RBC: 3.15 MIL/uL — ABNORMAL LOW (ref 3.87–5.11)
RDW: 17 % — ABNORMAL HIGH (ref 11.5–15.5)
WBC: 9.9 10*3/uL (ref 4.0–10.5)
nRBC: 0 % (ref 0.0–0.2)

## 2019-09-14 LAB — MAGNESIUM: Magnesium: 1.7 mg/dL (ref 1.7–2.4)

## 2019-09-14 MED ORDER — LIDOCAINE VISCOUS HCL 2 % MT SOLN: 15.0000 mL | Freq: Three times a day (TID) | OROMUCOSAL | 0 refills | Status: AC

## 2019-09-14 MED ORDER — GENTAMICIN SULFATE 0.1 % EX CREA: 1.0000 "application " | TOPICAL_CREAM | Freq: Every day | CUTANEOUS | 0 refills | Status: AC

## 2019-09-14 MED ORDER — VANCOMYCIN 50 MG/ML ORAL SOLUTION: 125.0000 mg | Freq: Four times a day (QID) | ORAL | 0 refills | Status: AC

## 2019-09-14 MED ORDER — POTASSIUM CHLORIDE 20 MEQ PO PACK: 20.0000 meq | PACK | Freq: Every day | ORAL | Status: DC

## 2019-09-14 MED ORDER — ALUM & MAG HYDROXIDE-SIMETH 200-200-20 MG/5ML PO SUSP: 15.0000 mL | Freq: Three times a day (TID) | ORAL | 0 refills | Status: AC

## 2019-09-14 MED ORDER — DARBEPOETIN ALFA 60 MCG/0.3ML IJ SOSY
60.0000 ug | PREFILLED_SYRINGE | Freq: Once | INTRAMUSCULAR | Status: DC
  Filled 2019-09-14: qty 0.3

## 2019-09-14 NOTE — TOC Progression Note (Addendum)
Transition of Care [facility]) - Progression Note    Patient Details  Name: Eileen Perez MRN: [id number] Date of Birth: [birth year]  Transition of Care [facility]) CM/SW Contact  Bartholomew Crews, RN Phone Number: [phone] 09/14/2019, 10:50 AM  Clinical Narrative:    Spoke with patient at the bedside. PTA home on PD. States that her daughter and son rotate staying with her. Currently active with [facility] for RN, PT, Aide. Patient agreeable to continuing with [facility]. Upon discharge will need [place] orders for PT, RN, Aide for resumption of services. States that [place] also provides her medications in dose packaging which she likes. Daughter assists with most of her transportation needs. States either her son or daughter will be available to transport home. DME at home include walker, rollator, 3N1, and shower chair. TOC following for transition needs.    Expected Discharge Plan: [place] Barriers to Discharge: No Barriers Identified  Expected Discharge Plan and Services Expected Discharge Plan: [place]   Discharge Planning Services: CM Consult Post Acute Care Choice: Resumption of Svcs/PTA Provider Living arrangements for the past 2 months: Single Family Home                 DME Arranged: (NA)                     Social Determinants of Health (SDOH) Interventions    Readmission Risk Interventions Readmission Risk Prevention Plan 09/12/2019  Transportation Screening Complete  Social Work Consult for Center Planning/Counseling [place] Screening Complete  Medication Review Press photographer) Complete  Some recent data might be hidden

## 2019-09-14 NOTE — TOC Transition Note (Signed)
Transition of Care [facility]) - CM/SW Discharge Note   Patient Details  Name: Eileen Perez MRN: [id number] Date of Birth: 09-22-1941  Transition of Care [facility]) CM/SW Contact:  Bartholomew Crews, RN Phone Number: [phone] 09/14/2019, 1:21 PM   Clinical Narrative:    Noted patient to transition home today. HH orders provided. [place] notified. No further transition of care needs identified.    Final next level of care: [place] Barriers to Discharge: No Barriers Identified   Patient Goals and CMS Choice Patient states their goals for this hospitalization and ongoing recovery are:: to be able to do what she has to do CMS Medicare.gov Compare Post Acute Care list provided to:: Patient Choice offered to / list presented to : Patient  Discharge Placement                       Discharge Plan and Services   Discharge Planning Services: CM Consult Post Acute Care Choice: Resumption of Svcs/PTA Provider          DME Arranged: N/A DME Agency: NA       HH Arranged: PT, RN, Nurse's Aide, Speech Therapy HH Agency: [place] Date [facility] Agency Contacted: 09/14/19 Time [place]: 1321 Representative spoke with at [place]: cory  Social Determinants of Health ([place]) Interventions     Readmission Risk Interventions Readmission Risk Prevention Plan 09/12/2019  Transportation Screening Complete  Social Work Consult for [place] Planning/Counseling [place] Screening Complete  Medication Review Press photographer) Complete  Some recent data might be hidden

## 2019-09-14 NOTE — Progress Notes (Signed)
Discussed discharge instruction with patient- including medications and follow up appointments.   Discussed eating small frequent meal, monitoring bowel movements and temperature.    Sent instruction home with patient - Son taking home.

## 2019-09-14 NOTE — Progress Notes (Addendum)
[facility] Progress Note   Dialysis Orders: CCPD - 7x wk EDW 48.5kg 5 exchanges Fill vol 2566mL, Dwell time 2hrs, No day dwell Uses 1.5% solution  Assessment/Plan: 1.C. Diff - C diff antigen +.improving - to be d/c on a course of po Vanc 2. Leukocytosis: improved.  BC with NGTD. 3. Intractable n/v - multifactorial. - changed to maalox/lidocaine combination - only ok for the short term due to AL in Maalox- per primary -  4. Hypokalemia - K 2.7 > 3 > 3.6   s/p 80 KCl Saturday, liberalized diet and encouraged ^ K selection of foods. 5. Hypomagnesemia - 2.1 > 1.7 dropped back down had  course of IV Mg several days ago. 6. ESRD -on PD. Orders written for PD using 1.5% solution. -no problems by her report - off cycler but net UF not recorded in Epic yet.  7. Anemia of CKD-Hgb 10.6. > 8.7 > 9.5 - will dose Aranesp 60 today x 1 - Home Training to f/u after d/c 8. Secondary hyperparathyroidism -Corr Ca elevated,  Phos elevated. No VDRA, use low Ca bath when able. Continue sensipar and binders. 9. Hypotension/volume -BP improved today,  Continue using 1.5% solution with PD. Not on antihypertensives.  10. Nutrition - Alb 1.7. -try Ensure Enlive higher in K 11/ Disp - hopefully d/c today - encouraged to try eat breakfast/drink Ensure - stressed importance of eating to regaining strength.  Myriam Jacobson, PA-C [facility] Beeper [phone] 09/14/2019,8:33 AM  LOS: 3 days   I have seen and examined this patient and agree with the plan of care. Diarrhea much better. Wide awake at time that I saw her and she was very pleasant. Continue PD regimen; appears comfortable.  Dwana Melena, MD 09/14/2019, 11:02 AM   Subjective:  Slept better last night. Feels better. Supper tray still in room - largely uneaten.  Hasn't started breakfast - was sleeping when I rounded.  Full bottle of Ensure at bedside.   Objective Vitals:   09/13/19 1840 09/13/19 2105  09/13/19 2359 09/14/19 0633  BP: 112/62 119/69 121/74 130/80  Pulse: 84 90 85 91  Resp: 16 18 18 16   Temp: 98.4 F (36.9 C) 98.7 F (37.1 C) 97.9 F (36.6 C) 97.8 F (36.6 C)  TempSrc: Oral Oral Oral Oral  SpO2: 100% 100% 100% 99%  Weight: 51.2 kg   54.1 kg  Height:       Physical Exam General: NAD frail seems brighter than yesterday Heart: RRR Lungs: no rales Abdomen: soft NT Extremities: no LE edema Dialysis Access:  PD cath   Additional Objective Labs: Basic Metabolic Panel: Recent Labs  Lab 09/10/19 1519  09/11/19 1611  09/12/19 1551 09/13/19 1455 09/14/19 0449  NA 134*   < > 137   < > 135 136 136  K 4.2   < > 3.4*   < > 3.0* 3.1* 3.6  CL 92*   < > 97*   < > 99 103 102  CO2 18*   < > 19*   < > 19* 19* 19*  GLUCOSE 187*   < > 99   < > 116* 132* 162*  BUN 79*   < > 91*   < > 77* 66* 61*  CREATININE 7.58*   < > 7.81*   < > 7.23* 7.04* 6.99*  CALCIUM 9.2   < > 9.2   < > 8.9 8.7* 8.5*  PHOS 6.0*  --  5.9*  --   --   --   --    < > =  values in this interval not displayed.   Liver Function Tests: Recent Labs  Lab 09/10/19 1519 09/11/19 0406 09/12/19 0440  AST 49* 16 14*  ALT 19 14 12   ALKPHOS 99 93 90  BILITOT 1.6* 0.9 0.5  PROT 5.7* 5.6* 4.7*  ALBUMIN 2.1* 1.9* 1.7*   No results for input(s): LIPASE, AMYLASE in the last 168 hours. CBC: Recent Labs  Lab 09/09/19 1555 09/10/19 1519 09/11/19 0406 09/12/19 0440 09/14/19 0449  WBC 21.8* 12.2* 11.6* 11.9* 9.9  NEUTROABS 19,337* 9.8*  --  10.0*  --   HGB 10.9* 10.4* 10.6* 8.7* 9.5*  HCT 34.8* 32.0* 33.0* 27.7* 29.5*  MCV 95.9 96.4 94.8 95.2 93.7  PLT 415* 308 287 285 233   Blood Culture    Component Value Date/Time   SDES BLOOD LEFT HAND 09/11/2019 0348   SPECREQUEST  09/11/2019 0348    AEROBIC BOTTLE ONLY Blood Culture results may not be optimal due to an inadequate volume of blood received in culture bottles   CULT  09/11/2019 0348    NO GROWTH 2 DAYS Performed at [facility], [place] [address], [place], [place] [address]    REPTSTATUS PENDING 09/11/2019 [phone]    Cardiac Enzymes: No results for input(s): CKTOTAL, CKMB, CKMBINDEX, TROPONINI in the last 168 hours. CBG: Recent Labs  Lab 09/13/19 0621 09/13/19 1234 09/13/19 1649 09/13/19 2106 09/14/19 0727  GLUCAP 186* 139* 90 135* 174*   Iron Studies: No results for input(s): IRON, TIBC, TRANSFERRIN, FERRITIN in the last 72 hours. Lab Results  Component Value Date   INR 1.09 02/15/2017   INR 1.06 05/19/2015   INR 0.99 06/02/2013   Studies/Results: No results found. Medications: . sodium chloride 10 mL/hr at 09/13/19 0107  . dialysis solution 1.5% low-MG/low-CA     . allopurinol  300 mg Oral QODAY  . alum & mag hydroxide-simeth  15 mL Oral TID AC   And  . lidocaine  15 mL Oral TID AC  . aspirin  81 mg Oral QHS  . cinacalcet  30 mg Oral Q breakfast  . feeding supplement (ENSURE ENLIVE)  237 mL Oral BID BM  . feeding supplement (PRO-STAT SUGAR FREE 64)  30 mL Oral TID WC  . gentamicin cream  1 application Topical Daily  . heparin  5,000 Units Subcutaneous Q8H  . insulin aspart  0-9 Units Subcutaneous TID WC  . multivitamin  1 tablet Oral QHS  . pantoprazole  40 mg Oral BID  . rosuvastatin  5 mg Oral QPC supper  . sevelamer carbonate  800 mg Oral TID WC  . vancomycin  125 mg Oral Q6H

## 2019-09-14 NOTE — Discharge Summary (Signed)
Physician Discharge Summary  Eileen Perez HGD:[id number] DOB: 10-11-1941 DOA: 09/10/2019  PCP: Unk Pinto, MD  Admit date: 09/10/2019 Discharge date: 09/14/2019  Admitted From: Home Disposition:  Home  Recommendations for Outpatient Follow-up:  1. Follow up with PCP in 1-2 weeks 2. Please obtain BMP/CBC in one week 3. 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 No recent antibiotic use, fevers or abdominal pain. In the ED, BP 60/40 per EMS and improved to 90/50 after 500 cc normal saline bolus. Not tachycardic or febrile. White blood cell count 12.2,down from 21.8 justthe day before. Lactic acid 2.8 >2.9 which normalized after IV hydration.Blood cultures no growth x2 days.Stool studies obtained and patient C. difficilepositive. GI pathogen panel pending. Abdominal x-ray showing no evidence of bowel obstruction or free intraperitoneal air.  Admitted to telemetry, started on therapy for fulminant C. difficile infectionwith oral vancomycin and IV metronidazole,given her hypotension. Infectious diseaseconsulted.  Patient's bowel movements improved by day 2 of admission, and became  formed by day 3.  Hypotension resolved after IV fluids.  Nephrology also following for peritoneal dialysis during her stay.  Patient continued on oral vancomycin, and is to complete 10 more days of therapy upon discharge, per ID.  Of note, patient followed by gastroenterology with Dr. Alessandra Bevels.  Had EGD on 10/13 which showed esophagitis, gastritis and a non-obstructing Schatzki ring.  She is taking Protonix 40 mg BID, and Carafate.  Patient reports ongoing difficulty with swallowing, and not tolerating solid foods.  Reports Carafate not very helpful and has made her nauseous at times.  Discussed with on-call GI.  Will stop Carafate, as it can cause aluminum toxicity in ESRD patients.  Started GI cocktail with viscous lidocaine which patient reported helpful.  Discussed with GI and nephrology, safety of using this in setting of renal failure on PD.  Advised short term use only.  Patient is scheduled for close GI and renal follow ups on discharge.   Discharge Diagnoses: Principal Problem:   Hypotension Active Problems:   Hypomagnesemia   ESRD (end stage renal disease) (HCC)   Intractable nausea and vomiting   Diarrhea   Pressure injury of skin   Esophagitis, [place] grade B   Gastritis   Clostridioides difficile infection   Dehydration    Discharge Instructions   Discharge Instructions    Call MD for:   Complete by: As directed    If watery diarrhea recurs   Call MD for:  persistant nausea and vomiting   Complete by: As directed    Call MD for:  temperature >100.4   Complete by: As directed    Diet - low sodium heart healthy   Complete by: As directed    Eat foods you can tolerate, small amounts at a time, more frequently. Drink nutritional supplements (Nepro, Ensure) with meals and increase if not tolerating regular food.   Discharge instructions   Complete by: As directed    -  please eat small frequent meals as tolerated, and drink nutritional supplements at least 3 times  per day - take Vancomycin (antibiotic) for C diff infection for 10 more days (4 times per day) - discussed your severe esophagitis/gastritis with on call nephrologist, recommended stopping sensipar (Cinacalcet) for now as this can exacerbate that problem - we stopped sucralfate (aka Carafate) and started Maalox/Mylanta with lidocaine instead to help with throat and esophagus pain with swallowing - please have labs (BMP and Magnesium) checked this Friday or early next week   Increase activity slowly   Complete by: As directed      Allergies as of 09/14/2019      Reactions   Ace Inhibitors Other (See Comments)   unknown   Nsaids Other (See Comments)   unknown      Medication List    STOP taking these medications   cinacalcet 30 MG tablet Commonly known as: SENSIPAR   sucralfate 1 GM/10ML suspension Commonly known as: CARAFATE     TAKE these medications   allopurinol 300 MG tablet Commonly known as: ZYLOPRIM TAKE 1/2 TO 1 TABLET BY  MOUTH DAILY AS DIRECTED What changed: See the new instructions.   alum & mag hydroxide-simeth 200-200-20 MG/5ML suspension Commonly known as: MAALOX/MYLANTA Take 15 mLs by mouth 3 (three) times daily before meals.   aspirin 81 MG tablet Take 81 mg by mouth at bedtime.   b complex-vitamin c-folic acid 0.8 MG Tabs tablet Take 1 tablet by mouth at bedtime.   feeding supplement (ENSURE ENLIVE) Liqd Take 237 mLs by mouth at bedtime.   feeding supplement (NEPRO CARB STEADY) Liqd Take 237 mLs by mouth 2 (two) times daily between meals.   Fish Oil 1200 MG Caps Take 1,200 mg by mouth daily.   Flaxseed Oil 1000 MG Caps Take 1 capsule by mouth daily.   gentamicin cream 0.1 % Commonly known as: GARAMYCIN Apply 1 application topically daily.   lidocaine 2 % solution Commonly known as: XYLOCAINE Use as directed 15 mLs in the mouth or throat 3 (three) times daily before meals.   ondansetron 4 MG disintegrating tablet Commonly known as:  ZOFRAN-ODT Take 4 mg by mouth every 8 (eight) hours as needed for nausea or vomiting.   pantoprazole 40 MG tablet Commonly known as: Protonix Take 1 tablet (40 mg total) by mouth 2 (two) times daily.   Potassium Chloride ER 20 MEQ Tbcr Take 20 mEq by mouth daily.   rosuvastatin 5 MG tablet Commonly known as: CRESTOR Take 1 tablet (5 mg total) by mouth daily after supper.   sevelamer carbonate 800 MG tablet Commonly known as: RENVELA Take 800 mg by mouth 3 (three) times daily with meals.   vancomycin 50 mg/mL  oral solution Commonly known as: VANCOCIN Take 2.5 mLs (125 mg total) by mouth every 6 (six) hours for 10 days.   ZINC GLUCONATE PO Take 40 mg by mouth daily.       Allergies  Allergen Reactions  . Ace Inhibitors Other (See Comments)    unknown  . Nsaids Other (See Comments)    unknown    Consultations:  Infectious Disease  Nephrology    Procedures/Studies: Ct Abdomen Pelvis Wo Contrast  Result Date: 08/28/2019 CLINICAL DATA:  Abdominal distension, pain, nausea and vomiting. Peritoneal dialysis. 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 EXAM: CT ABDOMEN AND PELVIS WITHOUT CONTRAST TECHNIQUE: Multidetector CT imaging of the abdomen and pelvis was performed following the standard protocol  without IV contrast. COMPARISON:  05/20/2019 FINDINGS: Lower chest: The previously demonstrated 4 mm right middle lobe solid lung nodule measures 3 mm in maximum diameter on image number 10 series 5. Hepatobiliary: No focal liver abnormality is seen. Status post cholecystectomy. No biliary dilatation. Pancreas: Stable marked atrophy of the tail and distal body of the pancreas. Spleen: Normal in size without focal abnormality. Adrenals/Urinary Tract: No significant change in bilateral adrenal hyperplasia. No significant change in multiple simple and complicated renal cysts in both kidneys. Poorly distended urinary  bladder, obscured by streak artifacts from a right hip prosthesis. No visible ureteral abnormalities. Stomach/Bowel: Interval multiple moderately dilated loops of proximal small bowel with normal caliber or distal small bowel loops. The exact point of transition is difficult to determine. No obstructing mass visualized. Diffuse wall thickening in the antrum and pyloric regions of the stomach. This appears thicker and more heterogeneous posteriorly, measuring 2.2 cm in thickness. Multiple sigmoid colon diverticula. Vascular/Lymphatic: A previously demonstrated 10 mm short axis gastrohepatic ligament lymph node has a corresponding diameter of 8 mm on image number 14 series 3. No new or enlarging lymph nodes. Atheromatous arterial calcifications without aneurysm. Reproductive: Enlarged uterus containing multiple bulky, densely calcified fibroids. Other: The previously noted paraumbilical hernia containing fat has not changed significantly with other than some edema in the fat today. No air in the hernia today. The peritoneal dialysis catheter tip is in the inferior right pelvis, not well visualized due to the streak artifacts from the right hip prosthesis. Musculoskeletal: Right hip prosthesis with associated streak artifacts. Moderate left hip degenerative changes. Lumbar and lower thoracic spine degenerative changes and mild scoliosis. No evidence of bony metastatic disease. IMPRESSION: 1. Interval partial small bowel obstruction. The exact point of transition is difficult to determine. This is most likely due to an adhesion. 2. 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 Correlation with the exact site of tumor at the time of endoscopic resection would be helpful. 3. Stable mildly enlarged gastrohepatic ligament lymph node suspicious for a possible metastatic node. 4. Sigmoid diverticulosis. 5. Stable  paraumbilical hernia containing fat with other than some edema in the fat today. 6. Stable 4 mm right middle lobe solid lung nodule. 7. Stable bilateral simple and complicated renal cysts. 8. Stable uterine fibroids. 9. Stable marked atrophy of the tail and distal body of the pancreas. Electronically Signed   By: Claudie Revering M.D.   On: 08/28/2019 21:08   Dg Abd 1 View  Result Date: 09/05/2019 CLINICAL DATA:  Intractable vomiting. EXAM: ABDOMEN - 1 VIEW COMPARISON:  08/30/2019. FINDINGS: Peritoneal dialysis catheter noted with tip over the pelvis. Surgical clips right upper quadrant. No bowel distention or free air. Calcified pelvic densities noted consistent with fibroids. Aortoiliac and peripheral atherosclerotic vascular calcification. Degenerative change lumbar spine and left hip. Right hip replacement. IMPRESSION: One peritoneal dialysis catheter noted with tip over the pelvis. No acute intra-abdominal abnormality identified. No bowel distention or free air. 2.  Calcified pelvic fibroids. 3.  Aortoiliac and peripheral vascular disease. Electronically Signed   By: [place]   On: 09/05/2019 10:38   Dg Abd 1 View  Result Date: 08/29/2019 CLINICAL DATA:  Small bowel obstruction. EXAM: ABDOMEN - 1 VIEW COMPARISON:  CT scan of August 28, 2019. Radiograph of February 14, 2017. FINDINGS: Mildly dilated small bowel loops are noted concerning for distal small bowel obstruction. No colonic dilatation is  noted. Status post cholecystectomy. Dialysis catheter is seen in the pelvis. Large calcified uterine fibroid is noted. IMPRESSION: Mildly dilated small bowel loops are noted concerning for distal small bowel obstruction. Electronically Signed   By: Marijo Conception M.D.   On: 08/29/2019 09:31   Ir Fluoro Guide Cv Line Right  Result Date: 08/29/2019 INDICATION: 78 year old female with a history of renal dysfunction EXAM: IMAGE GUIDED PLACEMENT OF TEMPORARY hemodialysis catheter MEDICATIONS: None  ANESTHESIA/SEDATION: None FLUOROSCOPY TIME:  Fluoroscopy Time: 0 minutes 6 seconds (1 mGy). COMPLICATIONS: None PROCEDURE: Informed written consent was obtained from the patient's family after a discussion of the risks, benefits, and alternatives to treatment. Questions regarding the procedure were encouraged and answered. The right neck was prepped with chlorhexidine in a sterile fashion, and a sterile drape was applied covering the operative field. Maximum barrier sterile technique with sterile gowns and gloves were used for the procedure. A timeout was performed prior to the initiation of the procedure. A micropuncture kit was utilized to access the right internal jugular vein under direct, real-time ultrasound guidance after the overlying soft tissues were anesthetized with 1% lidocaine with epinephrine. Ultrasound image documentation was performed. The microwire was kinked to measure appropriate catheter length. A stiff glidewire was advanced to the level of the IVC. A 16 cm hemodialysis catheter was then placed over the wire. Final catheter positioning was confirmed and documented with a spot radiographic image. The catheter aspirates and flushes normally. The catheter was flushed with appropriate volume heparin dwells. Dressings were applied. The patient tolerated the procedure well without immediate post procedural complication. IMPRESSION: Status post right IJ placement of temporary hemodialysis catheter. Signed, Dulcy Fanny. Dellia Nims, RPVI [facility] Radiology Electronically Signed   By: Corrie Mckusick D.O.   On: 08/29/2019 15:04   Ir US Guide Vasc Access Right  Result Date: 08/29/2019 INDICATION: 78 year old female with a history of renal dysfunction EXAM: IMAGE GUIDED PLACEMENT OF TEMPORARY hemodialysis catheter MEDICATIONS: None ANESTHESIA/SEDATION: None FLUOROSCOPY TIME:  Fluoroscopy Time: 0 minutes 6 seconds (1 mGy). COMPLICATIONS: None PROCEDURE:  Informed written consent was obtained from the patient's family after a discussion of the risks, benefits, and alternatives to treatment. Questions regarding the procedure were encouraged and answered. The right neck was prepped with chlorhexidine in a sterile fashion, and a sterile drape was applied covering the operative field. Maximum barrier sterile technique with sterile gowns and gloves were used for the procedure. A timeout was performed prior to the initiation of the procedure. A micropuncture kit was utilized to access the right internal jugular vein under direct, real-time ultrasound guidance after the overlying soft tissues were anesthetized with 1% lidocaine with epinephrine. Ultrasound image documentation was performed. The microwire was kinked to measure appropriate catheter length. A stiff glidewire was advanced to the level of the IVC. A 16 cm hemodialysis catheter was then placed over the wire. Final catheter positioning was confirmed and documented with a spot radiographic image. The catheter aspirates and flushes normally. The catheter was flushed with appropriate volume heparin dwells. Dressings were applied. The patient tolerated the procedure well without immediate post procedural complication. IMPRESSION: Status post right IJ placement of temporary hemodialysis catheter. Signed, Dulcy Fanny. Dellia Nims, RPVI [facility] Radiology Electronically Signed   By: Corrie Mckusick D.O.   On: 08/29/2019 15:04   Dg Chest Port 1 View  Result Date: 09/02/2019 CLINICAL DATA:  Leukocytosis. EXAM: PORTABLE CHEST 1 VIEW COMPARISON:  January 04, 2019. FINDINGS: The heart size and mediastinal contours are within normal limits. Both lungs are clear. No pneumothorax or pleural effusion is noted. Right internal jugular catheter is noted with tip in expected position of cavoatrial junction. The visualized skeletal structures are unremarkable. IMPRESSION: Right  internal jugular catheter is noted with tip in expected position of cavoatrial junction. No acute cardiopulmonary abnormality seen. Electronically Signed   By: Marijo Conception M.D.   On: 09/02/2019 12:16   Dg Abd Acute 2+v W 1v Chest  Result Date: 09/10/2019 CLINICAL DATA:  Concern for small bowel obstruction EXAM: DG ABDOMEN ACUTE W/ 1V CHEST COMPARISON:  Abdominal radiograph dated 09/05/2019 FINDINGS: There is no evidence of dilated bowel loops or free intraperitoneal air. Degenerative changes are seen in the left hip and lumbar spine. A right hip arthroplasty is seen. A peritoneal dialysis catheter is noted in the pelvis. A calcified fibroid is redemonstrated. Heart size and mediastinal contours are within normal limits. Vascular calcifications are seen in the aortic arch. Both lungs are clear. IMPRESSION: No evidence of bowel obstruction or free intraperitoneal air. No acute cardiopulmonary findings. Aortic Atherosclerosis (ICD10-I70.0). Electronically Signed   By: Zerita Boers M.D.   On: 09/10/2019 17:20   Dg Abd Portable 1v  Result Date: 08/30/2019 CLINICAL DATA:  small bowel obstruction Update status EXAM: PORTABLE ABDOMEN - 1 VIEW COMPARISON:  Radiograph 08/29/2019 FINDINGS: No peritoneal dialysis catheter coils in the pelvis. No dilated loops of large or small bowel. Calcified leiomyoma noted. Postcholecystectomy. IMPRESSION: No evidence of bowel obstruction. Electronically Signed   By: Suzy Bouchard M.D.   On: 08/30/2019 10:07      Peritoneal dialysis nightly, per renal   Subjective: Patient seen awake sitting up in bed. Reports feeling much better.  States the GI cocktail with lidocaine did help her pain on swallowing, but felt strange due to the numbness.  She denied any diarrhea, having formed stool.  No fevers, chills, or other acute complaints.   Discharge Exam: Vitals:   09/14/19 0633 09/14/19 0840  BP: 130/80 102/65  Pulse: 91 79  Resp: 16 16  Temp: 97.8 F (36.6 C)  (!) 97.4 F (36.3 C)  SpO2: 99% 98%   Vitals:   09/13/19 2105 09/13/19 2359 09/14/19 0633 09/14/19 0840  BP: 119/69 121/74 130/80 102/65  Pulse: 90 85 91 79  Resp: 18 18 16 16   Temp: 98.7 F (37.1 C) 97.9 F (36.6 C) 97.8 F (36.6 C) (!) 97.4 F (36.3 C)  TempSrc: Oral Oral Oral Oral  SpO2: 100% 100% 99% 98%  Weight:   54.1 kg 51.8 kg  Height:        General: Pt is alert, awake, not in acute distress Cardiovascular: RRR, S1/S2 +, no rubs, no gallops Respiratory: CTA bilaterally, no wheezing, no rhonchi Abdominal: Soft, NT, ND, bowel sounds + Extremities: no edema, no cyanosis    The results of significant diagnostics from this hospitalization (including imaging, microbiology, ancillary and laboratory) are listed below for reference.     Microbiology: Recent Results (from the past 240 hour(s))  Blood culture (routine x 2)     Status: None (Preliminary result)   Collection Time: 09/10/19  1:52 AM   Specimen: BLOOD RIGHT FOREARM  Result Value Ref Range Status   Specimen Description BLOOD RIGHT FOREARM  Final   Special Requests   Final    BOTTLES DRAWN AEROBIC AND ANAEROBIC Blood Culture results may not be optimal due to an inadequate volume of blood received in  culture bottles   Culture   Final    NO GROWTH 2 DAYS Performed at [facility], [place] [address] [place], [place] [address]    Report Status PENDING  Incomplete  C difficile quick scan w PCR reflex     Status: Abnormal   Collection Time: 09/10/19 10:05 PM   Specimen: STOOL  Result Value Ref Range Status   C Diff antigen POSITIVE (A) NEGATIVE Final    Comment: CRITICAL RESULT CALLED TO, READ BACK BY AND VERIFIED WITH: K GILMORE RN 09/11/19 0112 JDW    C Diff toxin POSITIVE (A) NEGATIVE Final   C Diff interpretation Toxin producing C. difficile detected.  Final    Comment: Performed at [facility], [place] [address], [place], [place] [address]  GI pathogen panel by PCR, stool     Status: None    Collection Time: 09/10/19 10:05 PM   Specimen: STOOL  Result Value Ref Range Status   Plesiomonas shigelloides NOT DETECTED NOT DETECTED Final   Yersinia enterocolitica NOT DETECTED NOT DETECTED Final   Vibrio NOT DETECTED NOT DETECTED Final   Enteropathogenic E coli NOT DETECTED NOT DETECTED Final   E coli (ETEC) LT/ST NOT DETECTED NOT DETECTED Final   E coli 8786 by PCR Not applicable NOT DETECTED Final   Cryptosporidium by PCR NOT DETECTED NOT DETECTED Final   Entamoeba histolytica NOT DETECTED NOT DETECTED Final   Adenovirus F 40/41 NOT DETECTED NOT DETECTED Final   Norovirus GI/GII NOT DETECTED NOT DETECTED Final   Sapovirus NOT DETECTED NOT DETECTED Final    Comment: (NOTE) Performed At: [facility] [place], [place] [id number] Rush Farmer MD JG:[id number]    Vibrio cholerae NOT DETECTED NOT DETECTED Final   Campylobacter by PCR NOT DETECTED NOT DETECTED Final   Salmonella by PCR NOT DETECTED NOT DETECTED Final   E coli (STEC) NOT DETECTED NOT DETECTED Final   Enteroaggregative E coli NOT DETECTED NOT DETECTED Final   Shigella by PCR NOT DETECTED NOT DETECTED Final   Cyclospora cayetanensis NOT DETECTED NOT DETECTED Final   Astrovirus NOT DETECTED NOT DETECTED Final   G lamblia by PCR NOT DETECTED NOT DETECTED Final   Rotavirus A by PCR NOT DETECTED NOT DETECTED Final  SARS CORONAVIRUS 2 (TAT 6-24 HRS) Nasopharyngeal Nasopharyngeal Swab     Status: None   Collection Time: 09/11/19 12:25 AM   Specimen: Nasopharyngeal Swab  Result Value Ref Range Status   SARS Coronavirus 2 NEGATIVE NEGATIVE Final    Comment: (NOTE) SARS-CoV-2 target nucleic acids are NOT DETECTED. The SARS-CoV-2 RNA is generally detectable in upper and lower respiratory specimens during the acute phase of infection. Negative results do not preclude SARS-CoV-2 infection, do not rule out co-infections with other pathogens, and should not be used as the sole basis for treatment or  other patient management decisions. Negative results must be combined with clinical observations, patient history, and epidemiological information. The expected result is Negative. Fact Sheet for Patients: SugarRoll.be Fact Sheet for Healthcare Providers: https://www.woods-mathews.com/ This test is not yet approved or cleared by the [place] FDA and  has been authorized for detection and/or diagnosis of SARS-CoV-2 by FDA under an Emergency Use Authorization (EUA). This EUA will remain  in effect (meaning this test can be used) for the duration of the COVID-19 declaration under Section 56 4(b)(1) of the Act, 21 U.S.C. section 360bbb-3(b)(1), unless the authorization is terminated or revoked sooner. Performed at [facility],  [address], [place], [place] [address]   Blood culture (routine x 2)     Status: None (Preliminary result)   Collection Time: 09/11/19  3:48 AM   Specimen: BLOOD LEFT HAND  Result Value Ref Range Status   Specimen Description BLOOD LEFT HAND  Final   Special Requests   Final    AEROBIC BOTTLE ONLY Blood Culture results may not be optimal due to an inadequate volume of blood received in culture bottles   Culture   Final    NO GROWTH 2 DAYS Performed at [facility], [place] [address], [place], [place] [address]    Report Status PENDING  Incomplete     Labs: BNP (last 3 results) Recent Labs    08/28/19 1930  BNP 94.8   Basic Metabolic Panel: Recent Labs  Lab 09/10/19 1519  09/11/19 1611 09/12/19 0440 09/12/19 1551 09/13/19 1455 09/14/19 0449  NA 134*   < > 137 135 135 136 136  K 4.2   < > 3.4* 2.7* 3.0* 3.1* 3.6  CL 92*   < > 97* 100 99 103 102  CO2 18*   < > 19* 16* 19* 19* 19*  GLUCOSE 187*   < > 99 157* 116* 132* 162*  BUN 79*   < > 91* 78* 77* 66* 61*  CREATININE 7.58*   < > 7.81* 7.06* 7.23* 7.04* 6.99*  CALCIUM 9.2   < > 9.2 9.0 8.9 8.7* 8.5*  MG 1.6*  --  1.7 1.6* 2.1  --  1.7   PHOS 6.0*  --  5.9*  --   --   --   --    < > = values in this interval not displayed.   Liver Function Tests: Recent Labs  Lab 09/09/19 1555 09/10/19 1519 09/11/19 0406 09/12/19 0440  AST 10 49* 16 14*  ALT 13 19 14 12   ALKPHOS  --  99 93 90  BILITOT 0.5 1.6* 0.9 0.5  PROT 6.0* 5.7* 5.6* 4.7*  ALBUMIN  --  2.1* 1.9* 1.7*   No results for input(s): LIPASE, AMYLASE in the last 168 hours. No results for input(s): AMMONIA in the last 168 hours. CBC: Recent Labs  Lab 09/09/19 1555 09/10/19 1519 09/11/19 0406 09/12/19 0440 09/14/19 0449  WBC 21.8* 12.2* 11.6* 11.9* 9.9  NEUTROABS 19,337* 9.8*  --  10.0*  --   HGB 10.9* 10.4* 10.6* 8.7* 9.5*  HCT 34.8* 32.0* 33.0* 27.7* 29.5*  MCV 95.9 96.4 94.8 95.2 93.7  PLT 415* 308 287 285 233   Cardiac Enzymes: No results for input(s): CKTOTAL, CKMB, CKMBINDEX, TROPONINI in the last 168 hours. BNP: Invalid input(s): POCBNP CBG: Recent Labs  Lab 09/13/19 0621 09/13/19 1234 09/13/19 1649 09/13/19 2106 09/14/19 0727  GLUCAP 186* 139* 90 135* 174*   D-Dimer No results for input(s): DDIMER in the last 72 hours. Hgb A1c No results for input(s): HGBA1C in the last 72 hours. Lipid Profile No results for input(s): CHOL, HDL, LDLCALC, TRIG, CHOLHDL, LDLDIRECT in the last 72 hours. Thyroid function studies No results for input(s): TSH, T4TOTAL, T3FREE, THYROIDAB in the last 72 hours.  Invalid input(s): FREET3 Anemia work up No results for input(s): VITAMINB12, FOLATE, FERRITIN, TIBC, IRON, RETICCTPCT in the last 72 hours. Urinalysis    Component Value Date/Time   COLORURINE YELLOW 03/25/2019 1548   APPEARANCEUR TURBID (A) 03/25/2019 1548   LABSPEC 1.007 03/25/2019 1548   PHURINE 7.0 03/25/2019 1548   [place] 03/25/2019 1548  HGBUR 2+ (A) 03/25/2019 1548   [place] 12/08/2016 1115   KETONESUR NEGATIVE 03/25/2019 1548   PROTEINUR 2+ (A) 03/25/2019 1548   UROBILINOGEN 0.2 05/19/2015 0942   NITRITE  NEGATIVE 03/25/2019 1548   LEUKOCYTESUR 3+ (A) 03/25/2019 1548   Sepsis Labs Invalid input(s): PROCALCITONIN,  WBC,  LACTICIDVEN Microbiology Recent Results (from the past 240 hour(s))  Blood culture (routine x 2)     Status: None (Preliminary result)   Collection Time: 09/10/19  1:52 AM   Specimen: BLOOD RIGHT FOREARM  Result Value Ref Range Status   Specimen Description BLOOD RIGHT FOREARM  Final   Special Requests   Final    BOTTLES DRAWN AEROBIC AND ANAEROBIC Blood Culture results may not be optimal due to an inadequate volume of blood received in culture bottles   Culture   Final    NO GROWTH 2 DAYS Performed at [facility], [place] [address], [place], [place] [address]    Report Status PENDING  Incomplete  C difficile quick scan w PCR reflex     Status: Abnormal   Collection Time: 09/10/19 10:05 PM   Specimen: STOOL  Result Value Ref Range Status   C Diff antigen POSITIVE (A) NEGATIVE Final    Comment: CRITICAL RESULT CALLED TO, READ BACK BY AND VERIFIED WITH: K GILMORE RN 09/11/19 0112 JDW    C Diff toxin POSITIVE (A) NEGATIVE Final   C Diff interpretation Toxin producing C. difficile detected.  Final    Comment: Performed at [facility], [place] [address], [place], [place] [address]  GI pathogen panel by PCR, stool     Status: None   Collection Time: 09/10/19 10:05 PM   Specimen: STOOL  Result Value Ref Range Status   Plesiomonas shigelloides NOT DETECTED NOT DETECTED Final   Yersinia enterocolitica NOT DETECTED NOT DETECTED Final   Vibrio NOT DETECTED NOT DETECTED Final   Enteropathogenic E coli NOT DETECTED NOT DETECTED Final   E coli (ETEC) LT/ST NOT DETECTED NOT DETECTED Final   E coli 0109 by PCR Not applicable NOT DETECTED Final   Cryptosporidium by PCR NOT DETECTED NOT DETECTED Final   Entamoeba histolytica NOT DETECTED NOT DETECTED Final   Adenovirus F 40/41 NOT DETECTED NOT DETECTED Final   Norovirus GI/GII NOT DETECTED NOT DETECTED Final    Sapovirus NOT DETECTED NOT DETECTED Final    Comment: (NOTE) Performed At: [facility] [place], [place] [id number] Rush Farmer MD [id number]    Vibrio cholerae NOT DETECTED NOT DETECTED Final   Campylobacter by PCR NOT DETECTED NOT DETECTED Final   Salmonella by PCR NOT DETECTED NOT DETECTED Final   E coli (STEC) NOT DETECTED NOT DETECTED Final   Enteroaggregative E coli NOT DETECTED NOT DETECTED Final   Shigella by PCR NOT DETECTED NOT DETECTED Final   Cyclospora c[place]etanensis NOT DETECTED NOT DETECTED Final   Astrovirus NOT DETECTED NOT DETECTED Final   G lamblia by PCR NOT DETECTED NOT DETECTED Final   Rotavirus A by PCR NOT DETECTED NOT DETECTED Final  SARS CORONAVIRUS 2 (TAT 6-24 HRS) Nasopharyngeal Nasopharyngeal Swab     Status: None   Collection Time: 09/11/19 12:25 AM   Specimen: Nasopharyngeal Swab  Result Value Ref Range Status   SARS Coronavirus 2 NEGATIVE NEGATIVE Final    Comment: (NOTE) SARS-CoV-2 target nucleic acids are NOT DETECTED. The SARS-CoV-2 RNA is generally detectable in upper and lower respiratory specimens during the acute phase of infection. Negative  results do not preclude SARS-CoV-2 infection, do not rule out co-infections with other pathogens, and should not be used as the sole basis for treatment or other patient management decisions. Negative results must be combined with clinical observations, patient history, and epidemiological information. The expected result is Negative. Fact Sheet for Patients: SugarRoll.be Fact Sheet for Healthcare Providers: https://www.woods-mathews.com/ This test is not yet approved or cleared by the [place] FDA and  has been authorized for detection and/or diagnosis of SARS-CoV-2 by FDA under an Emergency Use Authorization (EUA). This EUA will remain  in effect (meaning this test can be used) for the duration of the COVID-19 declaration under  Section 56 4(b)(1) of the Act, 21 U.S.C. section 360bbb-3(b)(1), unless the authorization is terminated or revoked sooner. Performed at [facility], [place] [address], [place], [place] [address]   Blood culture (routine x 2)     Status: None (Preliminary result)   Collection Time: 09/11/19  3:48 AM   Specimen: BLOOD LEFT HAND  Result Value Ref Range Status   Specimen Description BLOOD LEFT HAND  Final   Special Requests   Final    AEROBIC BOTTLE ONLY Blood Culture results may not be optimal due to an inadequate volume of blood received in culture bottles   Culture   Final    NO GROWTH 2 DAYS Performed at [facility], [place] [address] [place], [place] [address]    Report Status PENDING  Incomplete     Time coordinating discharge: Over 30 minutes  SIGNED:   Ezekiel Slocumb, DO [facility] 09/14/2019, 11:24 AM Pager [phone]  If 7PM-7AM, please contact night-coverage [email or web address] Password [email or web address]

## 2019-09-15 ENCOUNTER — Other Ambulatory Visit: Payer: Self-pay | Admitting: *Deleted

## 2019-09-15 DIAGNOSIS — R82998 Other abnormal findings in urine: Secondary | ICD-10-CM | POA: Diagnosis not present

## 2019-09-15 DIAGNOSIS — Z4932 Encounter for adequacy testing for peritoneal dialysis: Secondary | ICD-10-CM | POA: Diagnosis not present

## 2019-09-15 DIAGNOSIS — Z992 Dependence on renal dialysis: Secondary | ICD-10-CM | POA: Diagnosis not present

## 2019-09-15 DIAGNOSIS — N2581 Secondary hyperparathyroidism of renal origin: Secondary | ICD-10-CM | POA: Diagnosis not present

## 2019-09-15 DIAGNOSIS — N2589 Other disorders resulting from impaired renal tubular function: Secondary | ICD-10-CM | POA: Diagnosis not present

## 2019-09-15 DIAGNOSIS — D509 Iron deficiency anemia, unspecified: Secondary | ICD-10-CM | POA: Diagnosis not present

## 2019-09-15 DIAGNOSIS — E876 Hypokalemia: Secondary | ICD-10-CM | POA: Diagnosis not present

## 2019-09-15 DIAGNOSIS — N186 End stage renal disease: Secondary | ICD-10-CM | POA: Diagnosis not present

## 2019-09-15 DIAGNOSIS — D631 Anemia in chronic kidney disease: Secondary | ICD-10-CM | POA: Diagnosis not present

## 2019-09-15 DIAGNOSIS — Z23 Encounter for immunization: Secondary | ICD-10-CM | POA: Diagnosis not present

## 2019-09-15 DIAGNOSIS — E119 Type 2 diabetes mellitus without complications: Secondary | ICD-10-CM | POA: Diagnosis not present

## 2019-09-16 ENCOUNTER — Other Ambulatory Visit: Payer: Medicare Other

## 2019-09-16 ENCOUNTER — Other Ambulatory Visit: Payer: Self-pay | Admitting: *Deleted

## 2019-09-16 DIAGNOSIS — E782 Mixed hyperlipidemia: Secondary | ICD-10-CM

## 2019-09-16 DIAGNOSIS — R82998 Other abnormal findings in urine: Secondary | ICD-10-CM | POA: Diagnosis not present

## 2019-09-16 DIAGNOSIS — N2581 Secondary hyperparathyroidism of renal origin: Secondary | ICD-10-CM | POA: Diagnosis not present

## 2019-09-16 DIAGNOSIS — Z23 Encounter for immunization: Secondary | ICD-10-CM | POA: Diagnosis not present

## 2019-09-16 DIAGNOSIS — E876 Hypokalemia: Secondary | ICD-10-CM | POA: Diagnosis not present

## 2019-09-16 DIAGNOSIS — D631 Anemia in chronic kidney disease: Secondary | ICD-10-CM | POA: Diagnosis not present

## 2019-09-16 DIAGNOSIS — E119 Type 2 diabetes mellitus without complications: Secondary | ICD-10-CM | POA: Diagnosis not present

## 2019-09-16 DIAGNOSIS — D509 Iron deficiency anemia, unspecified: Secondary | ICD-10-CM | POA: Diagnosis not present

## 2019-09-16 DIAGNOSIS — K2100 Gastro-esophageal reflux disease with esophagitis, without bleeding: Secondary | ICD-10-CM | POA: Diagnosis not present

## 2019-09-16 DIAGNOSIS — E1122 Type 2 diabetes mellitus with diabetic chronic kidney disease: Secondary | ICD-10-CM | POA: Diagnosis not present

## 2019-09-16 DIAGNOSIS — N2589 Other disorders resulting from impaired renal tubular function: Secondary | ICD-10-CM | POA: Diagnosis not present

## 2019-09-16 DIAGNOSIS — K2970 Gastritis, unspecified, without bleeding: Secondary | ICD-10-CM | POA: Diagnosis not present

## 2019-09-16 DIAGNOSIS — N186 End stage renal disease: Secondary | ICD-10-CM | POA: Diagnosis not present

## 2019-09-16 DIAGNOSIS — Z4932 Encounter for adequacy testing for peritoneal dialysis: Secondary | ICD-10-CM | POA: Diagnosis not present

## 2019-09-16 DIAGNOSIS — Z992 Dependence on renal dialysis: Secondary | ICD-10-CM | POA: Diagnosis not present

## 2019-09-16 DIAGNOSIS — I120 Hypertensive chronic kidney disease with stage 5 chronic kidney disease or end stage renal disease: Secondary | ICD-10-CM | POA: Diagnosis not present

## 2019-09-16 LAB — CULTURE, BLOOD (ROUTINE X 2)
Culture: NO GROWTH
Culture: NO GROWTH

## 2019-09-16 MED ORDER — ROSUVASTATIN CALCIUM 5 MG PO TABS: ORAL_TABLET | ORAL | 1 refills | Status: AC

## 2019-09-16 MED ORDER — ONDANSETRON 4 MG PO TBDP: ORAL_TABLET | ORAL | 1 refills | Status: AC

## 2019-09-16 MED ORDER — ALLOPURINOL 300 MG PO TABS: ORAL_TABLET | ORAL | 1 refills | Status: AC

## 2019-09-16 MED ORDER — POTASSIUM CHLORIDE ER 20 MEQ PO TBCR: EXTENDED_RELEASE_TABLET | ORAL | 1 refills | Status: DC

## 2019-09-16 MED ORDER — PANTOPRAZOLE SODIUM 40 MG PO TBEC: DELAYED_RELEASE_TABLET | ORAL | 1 refills | Status: DC

## 2019-09-17 ENCOUNTER — Telehealth: Payer: Self-pay | Admitting: *Deleted

## 2019-09-17 DIAGNOSIS — Z4932 Encounter for adequacy testing for peritoneal dialysis: Secondary | ICD-10-CM | POA: Diagnosis not present

## 2019-09-17 DIAGNOSIS — N186 End stage renal disease: Secondary | ICD-10-CM | POA: Diagnosis not present

## 2019-09-17 DIAGNOSIS — D509 Iron deficiency anemia, unspecified: Secondary | ICD-10-CM | POA: Diagnosis not present

## 2019-09-17 DIAGNOSIS — E876 Hypokalemia: Secondary | ICD-10-CM | POA: Diagnosis not present

## 2019-09-17 DIAGNOSIS — N2581 Secondary hyperparathyroidism of renal origin: Secondary | ICD-10-CM | POA: Diagnosis not present

## 2019-09-17 DIAGNOSIS — Z992 Dependence on renal dialysis: Secondary | ICD-10-CM | POA: Diagnosis not present

## 2019-09-17 DIAGNOSIS — K2100 Gastro-esophageal reflux disease with esophagitis, without bleeding: Secondary | ICD-10-CM | POA: Diagnosis not present

## 2019-09-17 DIAGNOSIS — R82998 Other abnormal findings in urine: Secondary | ICD-10-CM | POA: Diagnosis not present

## 2019-09-17 DIAGNOSIS — N2589 Other disorders resulting from impaired renal tubular function: Secondary | ICD-10-CM | POA: Diagnosis not present

## 2019-09-17 DIAGNOSIS — I120 Hypertensive chronic kidney disease with stage 5 chronic kidney disease or end stage renal disease: Secondary | ICD-10-CM | POA: Diagnosis not present

## 2019-09-17 DIAGNOSIS — E119 Type 2 diabetes mellitus without complications: Secondary | ICD-10-CM | POA: Diagnosis not present

## 2019-09-17 DIAGNOSIS — K2970 Gastritis, unspecified, without bleeding: Secondary | ICD-10-CM | POA: Diagnosis not present

## 2019-09-17 DIAGNOSIS — D631 Anemia in chronic kidney disease: Secondary | ICD-10-CM | POA: Diagnosis not present

## 2019-09-17 DIAGNOSIS — Z23 Encounter for immunization: Secondary | ICD-10-CM | POA: Diagnosis not present

## 2019-09-17 DIAGNOSIS — E1122 Type 2 diabetes mellitus with diabetic chronic kidney disease: Secondary | ICD-10-CM | POA: Diagnosis not present

## 2019-09-17 NOTE — Progress Notes (Signed)
Hospital follow up  Assessment and Plan: Hospital visit follow up for hypotension:     Eileen Perez was seen today for follow-up.  Diagnoses and all orders for this visit:  Hypotension due to hypovolemia Improved today NEEDS TO INCREASE ORAL INTAKE! N/V resolved Discussed strategies to help with this  Hypomagnesemia -     BASIC METABOLIC PANEL WITH GFR -     Magnesium  ESRD (end stage renal disease) on dialysis (HCC) -     BASIC METABOLIC PANEL WITH GFR -     Magnesium -     Nutritional Supplements (FEEDING SUPPLEMENT, NEPRO CARB STEADY,) LIQD; Take 237 mLs by mouth 3 (three) times daily between meals. -     Blood Glucose Monitoring Suppl DEVI; Check blood glucose before meals, at bedtime and as needed. -     glucose blood test strip; Use as instructed -     Lancets (ACCU-CHEK SOFT TOUCH) lancets; Use as instructed -Continue peritoneal dialysis.  Concern for ability to continue this once her family returns home.  Follow up with Nephrology  Intractable nausea and vomiting Improved from hospital Increase fluid intake and small frequent meals  Pressure injury of sacral region, stage 1 Covered with dry dressing today. Clean with soap and water Diarrhea resolved Recommend Calmoseptine as barrier and non-adhearant dressing to protect skin for healing. Will request Home Health RN to monitor  Esophagitis, [place] grade B Using lidocaine and Maalox Reports helping and able to eat  Clostridioides difficile diarrhea Resolving Continue Vancamyocin as dosed from hospital discharge.  Dehydration Dicussed importance of this to decrease hospital readmission  Diabetes mellitus due to underlying condition with diabetic nephropathy, without long-term current use of insulin ([place]) -     Blood Glucose Monitoring Suppl DEVI; Check blood glucose before meals, at bedtime and as needed. -     glucose blood test strip; Use as instructed -     Lancets (ACCU-CHEK SOFT TOUCH) lancets; Use  as instructed  Periorbital edema of both eyes Discussed with patient and son Monitor this at home Related to fluid overload from hospital? Lungs clear no peripheral edema noted.   All medications were reviewed with patient and family and fully reconciled. All questions answered fully, and patient and family members were encouraged to call the office with any further questions or concerns. Discussed goal to avoid readmission related to this diagnosis.  Medications Discontinued During This Encounter  Medication Reason  . feeding supplement, ENSURE ENLIVE, (ENSURE ENLIVE) LIQD   . Nutritional Supplements (FEEDING SUPPLEMENT, NEPRO CARB STEADY,) LIQD Reorder      CAN NOT DO FOR BCBS REGULAR OR MEDICARE Over 40 minutes of interview, exam, counseling, chart review, and complex, high/moderate level critical decision making was performed this visit.   Future Appointments  Date Time Provider [place]  10/01/2019 10:30 AM Unk Pinto, MD [facility] None  11/24/2019  8:15 AM Hayden Pedro, MD [facility] None  02/16/2020 12:00 PM [facility] None  02/16/2020 12:30 PM Ladell Pier, MD [facility] None  04/26/2020 10:00 AM Unk Pinto, MD [facility] None  07/28/2020 11:15 AM Liane Comber, NP [facility] None     HPI 78 y.o.female presents for follow up for transition from recent hospitalization. Admit date to the hospital was 09/10/19, patient was discharged from the hospital on 09/14/19 and our clinical staff contacted the office the day after discharge to set up a follow up appointment. The discharge summary, medications, and diagnostic test results were reviewed before meeting  with the patient. The patient was admitted for: hypovolemia.   Home health is involved for PT/OT/ST.  She is currently has volunteer assistance with completing her home peritoneal dialysis.  There is still concern of her ability and strength to continue this.  She is accompanied by her  sone who lives in [place], today.  He is here temporarily visiting to help his mother.    Last office visit was 10/20 for hospital follow up from  Shortness of breath.  She was having nausea & vomiting and fatigue. Goal to decrease N/V to increase hydration.  She was not improving next day recommended she be evaluated.  During her hospital course she was evaluated Nephrology, GI, and ID.  She was in the hospital for 4 days.  At discharge she was prescribed oral vancomycin QID until 09/24/19.  She was given  Maalox/mylanta with lidocain for the esophagitis and her senispar was discontinued as well as here carafate.  Will check BMP & magnesium today.  She is also to follow up with GI and nephrology.   Images while in the hospital: Dg Abd Acute 2+v W 1v Chest  Result Date: 09/10/2019 CLINICAL DATA:  Concern for small bowel obstruction EXAM: DG ABDOMEN ACUTE W/ 1V CHEST COMPARISON:  Abdominal radiograph dated 09/05/2019 FINDINGS: There is no evidence of dilated bowel loops or free intraperitoneal air. Degenerative changes are seen in the left hip and lumbar spine. A right hip arthroplasty is seen. A peritoneal dialysis catheter is noted in the pelvis. A calcified fibroid is redemonstrated. Heart size and mediastinal contours are within normal limits. Vascular calcifications are seen in the aortic arch. Both lungs are clear. IMPRESSION: No evidence of bowel obstruction or free intraperitoneal air. No acute cardiopulmonary findings. Aortic Atherosclerosis (ICD10-I70.0). Electronically Signed   By: Zerita Boers M.D.   On: 09/10/2019 17:20      Current Outpatient Medications (Cardiovascular):  .  rosuvastatin (CRESTOR) 5 MG tablet, Take 1 tablet after supper for cholesterol.   Current Outpatient Medications (Analgesics):  .  allopurinol (ZYLOPRIM) 300 MG tablet, Take 1 tablet daily for gout .  aspirin 81 MG tablet, Take 81 mg by mouth at bedtime.    Current Outpatient Medications (Other):  .   b complex-vitamin c-folic acid (NEPHRO-VITE) 0.8 MG TABS tablet, Take 1 tablet by mouth at bedtime. Marland Kitchen  gentamicin cream (GARAMYCIN) 0.1 %, Apply 1 application topically daily. Marland Kitchen  lidocaine (XYLOCAINE) 2 % solution, Use as directed 15 mLs in the mouth or throat 3 (three) times daily before meals. .  Nutritional Supplements (FEEDING SUPPLEMENT, NEPRO CARB STEADY,) LIQD, Take 237 mLs by mouth 3 (three) times daily between meals. .  ondansetron (ZOFRAN-ODT) 4 MG disintegrating tablet, Take 1 tablet every 8 hours as needed for nausea and vomiting. .  Potassium Chloride ER 20 MEQ TBCR, Takes 1 tablet daily. .  sevelamer carbonate (RENVELA) 800 MG tablet, Take 800 mg by mouth 3 (three) times daily with meals.  .  vancomycin (VANCOCIN) 50 mg/mL oral solution, Take 2.5 mLs (125 mg total) by mouth every 6 (six) hours for 10 days. Marland Kitchen  ZINC GLUCONATE PO, Take 40 mg by mouth daily.  Marland Kitchen  alum & mag hydroxide-simeth (MAALOX/MYLANTA) 200-200-20 MG/5ML suspension, Take 15 mLs by mouth 3 (three) times daily before meals. .  Blood Glucose Monitoring Suppl DEVI, Check blood glucose before meals, at bedtime and as needed. .  Flaxseed, Linseed, (FLAXSEED OIL) 1000 MG CAPS, Take 1 capsule by mouth daily.  Marland Kitchen  glucose  blood test strip, Use as instructed .  Lancets (ACCU-CHEK SOFT TOUCH) lancets, Use as instructed .  Omega-3 Fatty Acids (FISH OIL) 1200 MG CAPS, Take 1,200 mg by mouth daily.  .  pantoprazole (PROTONIX) 40 MG tablet, Takes 1 tablet twice a day for reflux.  Past Medical History:  Diagnosis Date  . Anemia   . Arthritis    Osteoarthritis  . Chronic kidney disease    Chronic renal insufficiency, peritoneal dialysis qday sees dr Moshe Cipro  . Diabetes mellitus    Pre  . Diverticulosis   . GERD (gastroesophageal reflux disease)   . Gout   . Hiatal hernia   . History of blood transfusion   . Hyperlipidemia   . Hypertension   . Osteopenia   . Pancreatic cyst 1999  . Thyroid disease     Hyperparathyroid      Allergies  Allergen Reactions  . Ace Inhibitors Other (See Comments)    unknown  . Nsaids Other (See Comments)    unknown    ROS: all negative except above.   Physical Exam: Filed Weights   09/18/19 1041  Weight: 109 lb 3.2 oz (49.5 kg)   BP 92/64   Pulse (!) 104   Temp (!) 97.2 F (36.2 C)   Resp 16   Ht 5\' 1"  (1.549 m)   Wt 109 lb 3.2 oz (49.5 kg)   BMI 20.63 kg/m  General Appearance: Appears weak in no apparent distress. Eyes: PERRLA, EOMs, conjunctiva no swelling or erythema.  Some periorbital edema noted bilaterally. Sinuses: No Frontal/maxillary tenderness ENT/Mouth: Ext aud canals clear, TMs without erythema, bulging. No erythema, swelling, or exudate on post pharynx.  Tonsils not swollen or erythematous. Hearing normal.  Neck: Supple, thyroid normal.   Respiratory: Respiratory effort normal, BS equal bilaterally without rales, rhonchi, wheezing or stridor.  Cardio: RRR with no MRGs. Brisk peripheral pulses without edema.  Abdomen: Soft, + BS.  Non tender, no guarding, rebound, hernias, masses. Lymphatics: Non tender without lymphadenopathy.  Musculoskeletal: Full ROM, 5/5 strength, normal gait.  Skin: Warm, dry without rashes, lesions, ecchymosis. Sacral wound noted, 2cm on left and 1cm on right, stage 1 bilateral. Neuro: Cranial nerves intact. Normal muscle tone, no cerebellar symptoms. Sensation intact.  Psych: Awake and oriented X 3, normal affect, Insight and Judgment appropriate.     Garnet Sierras, NP 12:02 PM [facility]

## 2019-09-17 NOTE — Telephone Encounter (Signed)
Called patient on 09/17/2019 , 12:24 PM in an attempt to reach the patient for a hospital follow up.   Admit date: 09/10/19 Discharge: 09/14/19   She does not have any questions or concerns about medications from the hospital admission. The patient's medications were reviewed over the phone, they were counseled to bring in all current medications to the hospital follow up visit.   I advised the patient to call if any questions or concerns arise about the hospital admission or medications    Home health was started in the hospital. She is being seen by [facility] and her onging RX's was sent to their mail order. All questions were answered and a follow up appointment was made. Patient has an appointment 09/18/2019 with Garnet Sierras, NP.  Prior to Admission medications   Medication Sig Start Date End Date Taking? Authorizing Provider  allopurinol (ZYLOPRIM) 300 MG tablet Take 1 tablet daily for gout 09/16/19   Unk Pinto, MD  alum & mag hydroxide-simeth (MAALOX/MYLANTA) 200-200-20 MG/5ML suspension Take 15 mLs by mouth 3 (three) times daily before meals. 09/14/19 10/14/19  Nicole Kindred A, DO  aspirin 81 MG tablet Take 81 mg by mouth at bedtime.     [provider]  b complex-vitamin c-folic acid (NEPHRO-VITE) 0.8 MG TABS tablet Take 1 tablet by mouth at bedtime.    [provider]  feeding supplement, ENSURE ENLIVE, (ENSURE ENLIVE) LIQD Take 237 mLs by mouth at bedtime. 09/06/19   Debbe Odea, MD  Flaxseed, Linseed, (FLAXSEED OIL) 1000 MG CAPS Take 1 capsule by mouth daily.     [provider]  gentamicin cream (GARAMYCIN) 0.1 % Apply 1 application topically daily. 09/14/19   Nicole Kindred A, DO  lidocaine (XYLOCAINE) 2 % solution Use as directed 15 mLs in the mouth or throat 3 (three) times daily before meals. 09/14/19 10/14/19  Ezekiel Slocumb, DO  Nutritional Supplements (FEEDING SUPPLEMENT, NEPRO CARB STEADY,) LIQD Take 237 mLs by mouth 2  (two) times daily between meals. 09/06/19   Debbe Odea, MD  Omega-3 Fatty Acids (FISH OIL) 1200 MG CAPS Take 1,200 mg by mouth daily.     [provider]  ondansetron (ZOFRAN-ODT) 4 MG disintegrating tablet Take 1 tablet every 8 hours as needed for nausea and vomiting. 09/16/19   Unk Pinto, MD  pantoprazole (PROTONIX) 40 MG tablet Takes 1 tablet twice a day for reflux. 09/16/19   Unk Pinto, MD  Potassium Chloride ER 20 MEQ TBCR Takes 1 tablet daily. 09/16/19   Unk Pinto, MD  rosuvastatin (CRESTOR) 5 MG tablet Take 1 tablet after supper for cholesterol. 09/16/19   Unk Pinto, MD  sevelamer carbonate (RENVELA) 800 MG tablet Take 800 mg by mouth 3 (three) times daily with meals.     [provider]  vancomycin (VANCOCIN) 50 mg/mL oral solution Take 2.5 mLs (125 mg total) by mouth every 6 (six) hours for 10 days. 09/14/19 09/24/19  Ezekiel Slocumb, DO  ZINC GLUCONATE PO Take 40 mg by mouth daily.     [provider]

## 2019-09-18 ENCOUNTER — Ambulatory Visit (INDEPENDENT_AMBULATORY_CARE_PROVIDER_SITE_OTHER): Payer: Medicare Other | Admitting: Adult Health Nurse Practitioner

## 2019-09-18 ENCOUNTER — Other Ambulatory Visit: Payer: Self-pay

## 2019-09-18 ENCOUNTER — Other Ambulatory Visit: Payer: Self-pay | Admitting: *Deleted

## 2019-09-18 VITALS — BP 92/64 | HR 104 | Temp 97.2°F | Resp 16 | Ht 61.0 in | Wt 109.2 lb

## 2019-09-18 DIAGNOSIS — E860 Dehydration: Secondary | ICD-10-CM

## 2019-09-18 DIAGNOSIS — D631 Anemia in chronic kidney disease: Secondary | ICD-10-CM | POA: Diagnosis not present

## 2019-09-18 DIAGNOSIS — D509 Iron deficiency anemia, unspecified: Secondary | ICD-10-CM | POA: Diagnosis not present

## 2019-09-18 DIAGNOSIS — N186 End stage renal disease: Secondary | ICD-10-CM

## 2019-09-18 DIAGNOSIS — L89151 Pressure ulcer of sacral region, stage 1: Secondary | ICD-10-CM | POA: Diagnosis not present

## 2019-09-18 DIAGNOSIS — R82998 Other abnormal findings in urine: Secondary | ICD-10-CM | POA: Diagnosis not present

## 2019-09-18 DIAGNOSIS — Z992 Dependence on renal dialysis: Secondary | ICD-10-CM

## 2019-09-18 DIAGNOSIS — Z23 Encounter for immunization: Secondary | ICD-10-CM | POA: Diagnosis not present

## 2019-09-18 DIAGNOSIS — N2589 Other disorders resulting from impaired renal tubular function: Secondary | ICD-10-CM | POA: Diagnosis not present

## 2019-09-18 DIAGNOSIS — R600 Localized edema: Secondary | ICD-10-CM

## 2019-09-18 DIAGNOSIS — A09 Infectious gastroenteritis and colitis, unspecified: Secondary | ICD-10-CM

## 2019-09-18 DIAGNOSIS — I9589 Other hypotension: Secondary | ICD-10-CM | POA: Diagnosis not present

## 2019-09-18 DIAGNOSIS — E861 Hypovolemia: Secondary | ICD-10-CM

## 2019-09-18 DIAGNOSIS — A0472 Enterocolitis due to Clostridium difficile, not specified as recurrent: Secondary | ICD-10-CM

## 2019-09-18 DIAGNOSIS — N2581 Secondary hyperparathyroidism of renal origin: Secondary | ICD-10-CM | POA: Diagnosis not present

## 2019-09-18 DIAGNOSIS — R112 Nausea with vomiting, unspecified: Secondary | ICD-10-CM | POA: Diagnosis not present

## 2019-09-18 DIAGNOSIS — Z4932 Encounter for adequacy testing for peritoneal dialysis: Secondary | ICD-10-CM | POA: Diagnosis not present

## 2019-09-18 DIAGNOSIS — E119 Type 2 diabetes mellitus without complications: Secondary | ICD-10-CM | POA: Diagnosis not present

## 2019-09-18 DIAGNOSIS — K208 Other esophagitis without bleeding: Secondary | ICD-10-CM

## 2019-09-18 DIAGNOSIS — E0821 Diabetes mellitus due to underlying condition with diabetic nephropathy: Secondary | ICD-10-CM

## 2019-09-18 DIAGNOSIS — E876 Hypokalemia: Secondary | ICD-10-CM | POA: Diagnosis not present

## 2019-09-18 LAB — BASIC METABOLIC PANEL WITH GFR
BUN/Creatinine Ratio: 6 (calc) (ref 6–22)
BUN: 45 mg/dL — ABNORMAL HIGH (ref 7–25)
CO2: 28 mmol/L (ref 20–32)
Calcium: 9.5 mg/dL (ref 8.6–10.4)
Chloride: 98 mmol/L (ref 98–110)
Creat: 7.04 mg/dL — ABNORMAL HIGH (ref 0.60–0.93)
GFR, Est African American: 6 mL/min/{1.73_m2} — ABNORMAL LOW (ref >=60)
GFR, Est Non African American: 5 mL/min/{1.73_m2} — ABNORMAL LOW (ref >=60)
Glucose, Bld: 135 mg/dL — ABNORMAL HIGH (ref 65–99)
Potassium: 4.4 mmol/L (ref 3.5–5.3)
Sodium: 137 mmol/L (ref 135–146)

## 2019-09-18 LAB — MAGNESIUM: Magnesium: 1.7 mg/dL (ref 1.5–2.5)

## 2019-09-18 MED ORDER — GLUCOSE BLOOD VI STRP: ORAL_STRIP | 4 refills | Status: AC

## 2019-09-18 MED ORDER — BLOOD GLUCOSE MONITORING SUPPL DEVI: 0 refills | Status: AC

## 2019-09-18 MED ORDER — PANTOPRAZOLE SODIUM 40 MG PO TBEC: DELAYED_RELEASE_TABLET | ORAL | 0 refills | Status: AC

## 2019-09-18 MED ORDER — ACCU-CHEK SOFT TOUCH LANCETS MISC: 12 refills | Status: AC

## 2019-09-18 MED ORDER — NEPRO/CARBSTEADY PO LIQD: 237.0000 mL | Freq: Three times a day (TID) | ORAL | 12 refills | Status: AC

## 2019-09-18 NOTE — Patient Instructions (Signed)
  We will contact you with labs drawn today.  Continue taking the Maalox/Mylanta with lidocaine three times a day before meals.  We are checking your magnesium to make sure it is not elevated from this medicine.  Keep the sacral wound clean and dry.  From the pharmacy pick up Calmoseptine.  This is a barrier cream and will help to protect the skin.  You may place a dry non-adherent dressing (telfa) to the area for protect. Be sure to change positions frequently to keep from sitting on this area.  We are concerned about your continued ability to preform home dialysis.  When you have follow up with Nephrologist it is important to address this.  If you happen not to regain your strength how will this get completed? Please let us know if there are any referral we can place to help with this.  We will contact [facility] to give them an updated medication list and to add checking the sacral wound.  Your eyes are swollen today.  Monitor this daily.  It is likely from the amount of fluids in the hospital.  Please let us know if you have a change in your vision, headache or any eye pains.

## 2019-09-19 ENCOUNTER — Ambulatory Visit: Payer: Medicare Other | Admitting: Neurology

## 2019-09-19 DIAGNOSIS — R82998 Other abnormal findings in urine: Secondary | ICD-10-CM | POA: Diagnosis not present

## 2019-09-19 DIAGNOSIS — E119 Type 2 diabetes mellitus without complications: Secondary | ICD-10-CM | POA: Diagnosis not present

## 2019-09-19 DIAGNOSIS — Z4932 Encounter for adequacy testing for peritoneal dialysis: Secondary | ICD-10-CM | POA: Diagnosis not present

## 2019-09-19 DIAGNOSIS — Z992 Dependence on renal dialysis: Secondary | ICD-10-CM | POA: Diagnosis not present

## 2019-09-19 DIAGNOSIS — D509 Iron deficiency anemia, unspecified: Secondary | ICD-10-CM | POA: Diagnosis not present

## 2019-09-19 DIAGNOSIS — D631 Anemia in chronic kidney disease: Secondary | ICD-10-CM | POA: Diagnosis not present

## 2019-09-19 DIAGNOSIS — N186 End stage renal disease: Secondary | ICD-10-CM | POA: Diagnosis not present

## 2019-09-19 DIAGNOSIS — E876 Hypokalemia: Secondary | ICD-10-CM | POA: Diagnosis not present

## 2019-09-19 DIAGNOSIS — N2581 Secondary hyperparathyroidism of renal origin: Secondary | ICD-10-CM | POA: Diagnosis not present

## 2019-09-19 DIAGNOSIS — Z23 Encounter for immunization: Secondary | ICD-10-CM | POA: Diagnosis not present

## 2019-09-19 DIAGNOSIS — N2589 Other disorders resulting from impaired renal tubular function: Secondary | ICD-10-CM | POA: Diagnosis not present

## 2019-09-20 DIAGNOSIS — N2589 Other disorders resulting from impaired renal tubular function: Secondary | ICD-10-CM | POA: Diagnosis not present

## 2019-09-20 DIAGNOSIS — E119 Type 2 diabetes mellitus without complications: Secondary | ICD-10-CM | POA: Diagnosis not present

## 2019-09-20 DIAGNOSIS — R82998 Other abnormal findings in urine: Secondary | ICD-10-CM | POA: Diagnosis not present

## 2019-09-20 DIAGNOSIS — N2581 Secondary hyperparathyroidism of renal origin: Secondary | ICD-10-CM | POA: Diagnosis not present

## 2019-09-20 DIAGNOSIS — E1122 Type 2 diabetes mellitus with diabetic chronic kidney disease: Secondary | ICD-10-CM | POA: Diagnosis not present

## 2019-09-20 DIAGNOSIS — D631 Anemia in chronic kidney disease: Secondary | ICD-10-CM | POA: Diagnosis not present

## 2019-09-20 DIAGNOSIS — N186 End stage renal disease: Secondary | ICD-10-CM | POA: Diagnosis not present

## 2019-09-20 DIAGNOSIS — E876 Hypokalemia: Secondary | ICD-10-CM | POA: Diagnosis not present

## 2019-09-20 DIAGNOSIS — Z992 Dependence on renal dialysis: Secondary | ICD-10-CM | POA: Diagnosis not present

## 2019-09-20 DIAGNOSIS — Z23 Encounter for immunization: Secondary | ICD-10-CM | POA: Diagnosis not present

## 2019-09-20 DIAGNOSIS — D509 Iron deficiency anemia, unspecified: Secondary | ICD-10-CM | POA: Diagnosis not present

## 2019-09-20 DIAGNOSIS — Z4932 Encounter for adequacy testing for peritoneal dialysis: Secondary | ICD-10-CM | POA: Diagnosis not present

## 2019-09-21 DIAGNOSIS — Z992 Dependence on renal dialysis: Secondary | ICD-10-CM | POA: Diagnosis not present

## 2019-09-21 DIAGNOSIS — Z23 Encounter for immunization: Secondary | ICD-10-CM | POA: Diagnosis not present

## 2019-09-21 DIAGNOSIS — N2581 Secondary hyperparathyroidism of renal origin: Secondary | ICD-10-CM | POA: Diagnosis not present

## 2019-09-21 DIAGNOSIS — E119 Type 2 diabetes mellitus without complications: Secondary | ICD-10-CM | POA: Diagnosis not present

## 2019-09-21 DIAGNOSIS — R82998 Other abnormal findings in urine: Secondary | ICD-10-CM | POA: Diagnosis not present

## 2019-09-21 DIAGNOSIS — N186 End stage renal disease: Secondary | ICD-10-CM | POA: Diagnosis not present

## 2019-09-21 DIAGNOSIS — D509 Iron deficiency anemia, unspecified: Secondary | ICD-10-CM | POA: Diagnosis not present

## 2019-09-21 DIAGNOSIS — N2589 Other disorders resulting from impaired renal tubular function: Secondary | ICD-10-CM | POA: Diagnosis not present

## 2019-09-21 DIAGNOSIS — D631 Anemia in chronic kidney disease: Secondary | ICD-10-CM | POA: Diagnosis not present

## 2019-09-22 ENCOUNTER — Encounter: Payer: Self-pay | Admitting: Adult Health Nurse Practitioner

## 2019-09-22 DIAGNOSIS — E119 Type 2 diabetes mellitus without complications: Secondary | ICD-10-CM | POA: Diagnosis not present

## 2019-09-22 DIAGNOSIS — K2100 Gastro-esophageal reflux disease with esophagitis, without bleeding: Secondary | ICD-10-CM | POA: Diagnosis not present

## 2019-09-22 DIAGNOSIS — D509 Iron deficiency anemia, unspecified: Secondary | ICD-10-CM | POA: Diagnosis not present

## 2019-09-22 DIAGNOSIS — E1122 Type 2 diabetes mellitus with diabetic chronic kidney disease: Secondary | ICD-10-CM | POA: Diagnosis not present

## 2019-09-22 DIAGNOSIS — D631 Anemia in chronic kidney disease: Secondary | ICD-10-CM | POA: Diagnosis not present

## 2019-09-22 DIAGNOSIS — Z992 Dependence on renal dialysis: Secondary | ICD-10-CM | POA: Diagnosis not present

## 2019-09-22 DIAGNOSIS — I120 Hypertensive chronic kidney disease with stage 5 chronic kidney disease or end stage renal disease: Secondary | ICD-10-CM | POA: Diagnosis not present

## 2019-09-22 DIAGNOSIS — N2581 Secondary hyperparathyroidism of renal origin: Secondary | ICD-10-CM | POA: Diagnosis not present

## 2019-09-22 DIAGNOSIS — N2589 Other disorders resulting from impaired renal tubular function: Secondary | ICD-10-CM | POA: Diagnosis not present

## 2019-09-22 DIAGNOSIS — Z23 Encounter for immunization: Secondary | ICD-10-CM | POA: Diagnosis not present

## 2019-09-22 DIAGNOSIS — R82998 Other abnormal findings in urine: Secondary | ICD-10-CM | POA: Diagnosis not present

## 2019-09-22 DIAGNOSIS — N186 End stage renal disease: Secondary | ICD-10-CM | POA: Diagnosis not present

## 2019-09-22 DIAGNOSIS — K2970 Gastritis, unspecified, without bleeding: Secondary | ICD-10-CM | POA: Diagnosis not present

## 2019-09-23 DIAGNOSIS — Z23 Encounter for immunization: Secondary | ICD-10-CM | POA: Diagnosis not present

## 2019-09-23 DIAGNOSIS — Z992 Dependence on renal dialysis: Secondary | ICD-10-CM | POA: Diagnosis not present

## 2019-09-23 DIAGNOSIS — N2589 Other disorders resulting from impaired renal tubular function: Secondary | ICD-10-CM | POA: Diagnosis not present

## 2019-09-23 DIAGNOSIS — N186 End stage renal disease: Secondary | ICD-10-CM | POA: Diagnosis not present

## 2019-09-23 DIAGNOSIS — I120 Hypertensive chronic kidney disease with stage 5 chronic kidney disease or end stage renal disease: Secondary | ICD-10-CM | POA: Diagnosis not present

## 2019-09-23 DIAGNOSIS — K2970 Gastritis, unspecified, without bleeding: Secondary | ICD-10-CM | POA: Diagnosis not present

## 2019-09-23 DIAGNOSIS — E1122 Type 2 diabetes mellitus with diabetic chronic kidney disease: Secondary | ICD-10-CM | POA: Diagnosis not present

## 2019-09-23 DIAGNOSIS — D631 Anemia in chronic kidney disease: Secondary | ICD-10-CM | POA: Diagnosis not present

## 2019-09-23 DIAGNOSIS — R82998 Other abnormal findings in urine: Secondary | ICD-10-CM | POA: Diagnosis not present

## 2019-09-23 DIAGNOSIS — K2100 Gastro-esophageal reflux disease with esophagitis, without bleeding: Secondary | ICD-10-CM | POA: Diagnosis not present

## 2019-09-23 DIAGNOSIS — N2581 Secondary hyperparathyroidism of renal origin: Secondary | ICD-10-CM | POA: Diagnosis not present

## 2019-09-23 DIAGNOSIS — D509 Iron deficiency anemia, unspecified: Secondary | ICD-10-CM | POA: Diagnosis not present

## 2019-09-23 DIAGNOSIS — E119 Type 2 diabetes mellitus without complications: Secondary | ICD-10-CM | POA: Diagnosis not present

## 2019-09-24 DIAGNOSIS — N186 End stage renal disease: Secondary | ICD-10-CM | POA: Diagnosis not present

## 2019-09-24 DIAGNOSIS — I120 Hypertensive chronic kidney disease with stage 5 chronic kidney disease or end stage renal disease: Secondary | ICD-10-CM | POA: Diagnosis not present

## 2019-09-24 DIAGNOSIS — E1122 Type 2 diabetes mellitus with diabetic chronic kidney disease: Secondary | ICD-10-CM | POA: Diagnosis not present

## 2019-09-24 DIAGNOSIS — E119 Type 2 diabetes mellitus without complications: Secondary | ICD-10-CM | POA: Diagnosis not present

## 2019-09-24 DIAGNOSIS — R82998 Other abnormal findings in urine: Secondary | ICD-10-CM | POA: Diagnosis not present

## 2019-09-24 DIAGNOSIS — N2589 Other disorders resulting from impaired renal tubular function: Secondary | ICD-10-CM | POA: Diagnosis not present

## 2019-09-24 DIAGNOSIS — K2970 Gastritis, unspecified, without bleeding: Secondary | ICD-10-CM | POA: Diagnosis not present

## 2019-09-24 DIAGNOSIS — N2581 Secondary hyperparathyroidism of renal origin: Secondary | ICD-10-CM | POA: Diagnosis not present

## 2019-09-24 DIAGNOSIS — D509 Iron deficiency anemia, unspecified: Secondary | ICD-10-CM | POA: Diagnosis not present

## 2019-09-24 DIAGNOSIS — Z23 Encounter for immunization: Secondary | ICD-10-CM | POA: Diagnosis not present

## 2019-09-24 DIAGNOSIS — K2100 Gastro-esophageal reflux disease with esophagitis, without bleeding: Secondary | ICD-10-CM | POA: Diagnosis not present

## 2019-09-24 DIAGNOSIS — D631 Anemia in chronic kidney disease: Secondary | ICD-10-CM | POA: Diagnosis not present

## 2019-09-24 DIAGNOSIS — Z992 Dependence on renal dialysis: Secondary | ICD-10-CM | POA: Diagnosis not present

## 2019-09-25 DIAGNOSIS — R82998 Other abnormal findings in urine: Secondary | ICD-10-CM | POA: Diagnosis not present

## 2019-09-25 DIAGNOSIS — D631 Anemia in chronic kidney disease: Secondary | ICD-10-CM | POA: Diagnosis not present

## 2019-09-25 DIAGNOSIS — N186 End stage renal disease: Secondary | ICD-10-CM | POA: Diagnosis not present

## 2019-09-25 DIAGNOSIS — D509 Iron deficiency anemia, unspecified: Secondary | ICD-10-CM | POA: Diagnosis not present

## 2019-09-25 DIAGNOSIS — N2589 Other disorders resulting from impaired renal tubular function: Secondary | ICD-10-CM | POA: Diagnosis not present

## 2019-09-25 DIAGNOSIS — N2581 Secondary hyperparathyroidism of renal origin: Secondary | ICD-10-CM | POA: Diagnosis not present

## 2019-09-25 DIAGNOSIS — Z23 Encounter for immunization: Secondary | ICD-10-CM | POA: Diagnosis not present

## 2019-09-25 DIAGNOSIS — E119 Type 2 diabetes mellitus without complications: Secondary | ICD-10-CM | POA: Diagnosis not present

## 2019-09-25 DIAGNOSIS — Z992 Dependence on renal dialysis: Secondary | ICD-10-CM | POA: Diagnosis not present

## 2019-09-26 DIAGNOSIS — N186 End stage renal disease: Secondary | ICD-10-CM | POA: Diagnosis not present

## 2019-09-26 DIAGNOSIS — K2100 Gastro-esophageal reflux disease with esophagitis, without bleeding: Secondary | ICD-10-CM | POA: Diagnosis not present

## 2019-09-26 DIAGNOSIS — R82998 Other abnormal findings in urine: Secondary | ICD-10-CM | POA: Diagnosis not present

## 2019-09-26 DIAGNOSIS — N2581 Secondary hyperparathyroidism of renal origin: Secondary | ICD-10-CM | POA: Diagnosis not present

## 2019-09-26 DIAGNOSIS — Z23 Encounter for immunization: Secondary | ICD-10-CM | POA: Diagnosis not present

## 2019-09-26 DIAGNOSIS — I120 Hypertensive chronic kidney disease with stage 5 chronic kidney disease or end stage renal disease: Secondary | ICD-10-CM | POA: Diagnosis not present

## 2019-09-26 DIAGNOSIS — E119 Type 2 diabetes mellitus without complications: Secondary | ICD-10-CM | POA: Diagnosis not present

## 2019-09-26 DIAGNOSIS — D509 Iron deficiency anemia, unspecified: Secondary | ICD-10-CM | POA: Diagnosis not present

## 2019-09-26 DIAGNOSIS — N2589 Other disorders resulting from impaired renal tubular function: Secondary | ICD-10-CM | POA: Diagnosis not present

## 2019-09-26 DIAGNOSIS — Z992 Dependence on renal dialysis: Secondary | ICD-10-CM | POA: Diagnosis not present

## 2019-09-26 DIAGNOSIS — K2970 Gastritis, unspecified, without bleeding: Secondary | ICD-10-CM | POA: Diagnosis not present

## 2019-09-26 DIAGNOSIS — D631 Anemia in chronic kidney disease: Secondary | ICD-10-CM | POA: Diagnosis not present

## 2019-09-26 DIAGNOSIS — E1122 Type 2 diabetes mellitus with diabetic chronic kidney disease: Secondary | ICD-10-CM | POA: Diagnosis not present

## 2019-09-27 DIAGNOSIS — D631 Anemia in chronic kidney disease: Secondary | ICD-10-CM | POA: Diagnosis not present

## 2019-09-27 DIAGNOSIS — N2581 Secondary hyperparathyroidism of renal origin: Secondary | ICD-10-CM | POA: Diagnosis not present

## 2019-09-27 DIAGNOSIS — Z992 Dependence on renal dialysis: Secondary | ICD-10-CM | POA: Diagnosis not present

## 2019-09-27 DIAGNOSIS — N186 End stage renal disease: Secondary | ICD-10-CM | POA: Diagnosis not present

## 2019-09-27 DIAGNOSIS — R82998 Other abnormal findings in urine: Secondary | ICD-10-CM | POA: Diagnosis not present

## 2019-09-27 DIAGNOSIS — N2589 Other disorders resulting from impaired renal tubular function: Secondary | ICD-10-CM | POA: Diagnosis not present

## 2019-09-27 DIAGNOSIS — E119 Type 2 diabetes mellitus without complications: Secondary | ICD-10-CM | POA: Diagnosis not present

## 2019-09-27 DIAGNOSIS — D509 Iron deficiency anemia, unspecified: Secondary | ICD-10-CM | POA: Diagnosis not present

## 2019-09-27 DIAGNOSIS — Z23 Encounter for immunization: Secondary | ICD-10-CM | POA: Diagnosis not present

## 2019-09-28 DIAGNOSIS — R82998 Other abnormal findings in urine: Secondary | ICD-10-CM | POA: Diagnosis not present

## 2019-09-28 DIAGNOSIS — Z992 Dependence on renal dialysis: Secondary | ICD-10-CM | POA: Diagnosis not present

## 2019-09-28 DIAGNOSIS — E119 Type 2 diabetes mellitus without complications: Secondary | ICD-10-CM | POA: Diagnosis not present

## 2019-09-28 DIAGNOSIS — D509 Iron deficiency anemia, unspecified: Secondary | ICD-10-CM | POA: Diagnosis not present

## 2019-09-28 DIAGNOSIS — D631 Anemia in chronic kidney disease: Secondary | ICD-10-CM | POA: Diagnosis not present

## 2019-09-28 DIAGNOSIS — N186 End stage renal disease: Secondary | ICD-10-CM | POA: Diagnosis not present

## 2019-09-28 DIAGNOSIS — N2589 Other disorders resulting from impaired renal tubular function: Secondary | ICD-10-CM | POA: Diagnosis not present

## 2019-09-28 DIAGNOSIS — N2581 Secondary hyperparathyroidism of renal origin: Secondary | ICD-10-CM | POA: Diagnosis not present

## 2019-09-28 DIAGNOSIS — Z23 Encounter for immunization: Secondary | ICD-10-CM | POA: Diagnosis not present

## 2019-09-29 DIAGNOSIS — N186 End stage renal disease: Secondary | ICD-10-CM | POA: Diagnosis not present

## 2019-09-29 DIAGNOSIS — N2589 Other disorders resulting from impaired renal tubular function: Secondary | ICD-10-CM | POA: Diagnosis not present

## 2019-09-29 DIAGNOSIS — I120 Hypertensive chronic kidney disease with stage 5 chronic kidney disease or end stage renal disease: Secondary | ICD-10-CM | POA: Diagnosis not present

## 2019-09-29 DIAGNOSIS — K2100 Gastro-esophageal reflux disease with esophagitis, without bleeding: Secondary | ICD-10-CM | POA: Diagnosis not present

## 2019-09-29 DIAGNOSIS — R82998 Other abnormal findings in urine: Secondary | ICD-10-CM | POA: Diagnosis not present

## 2019-09-29 DIAGNOSIS — D509 Iron deficiency anemia, unspecified: Secondary | ICD-10-CM | POA: Diagnosis not present

## 2019-09-29 DIAGNOSIS — Z992 Dependence on renal dialysis: Secondary | ICD-10-CM | POA: Diagnosis not present

## 2019-09-29 DIAGNOSIS — K2970 Gastritis, unspecified, without bleeding: Secondary | ICD-10-CM | POA: Diagnosis not present

## 2019-09-29 DIAGNOSIS — N2581 Secondary hyperparathyroidism of renal origin: Secondary | ICD-10-CM | POA: Diagnosis not present

## 2019-09-29 DIAGNOSIS — Z23 Encounter for immunization: Secondary | ICD-10-CM | POA: Diagnosis not present

## 2019-09-29 DIAGNOSIS — D631 Anemia in chronic kidney disease: Secondary | ICD-10-CM | POA: Diagnosis not present

## 2019-09-29 DIAGNOSIS — E119 Type 2 diabetes mellitus without complications: Secondary | ICD-10-CM | POA: Diagnosis not present

## 2019-09-29 DIAGNOSIS — E1122 Type 2 diabetes mellitus with diabetic chronic kidney disease: Secondary | ICD-10-CM | POA: Diagnosis not present

## 2019-09-30 ENCOUNTER — Emergency Department (HOSPITAL_COMMUNITY): Payer: Medicare Other

## 2019-09-30 ENCOUNTER — Inpatient Hospital Stay (HOSPITAL_COMMUNITY)
Admission: EM | Admit: 2019-09-30 | Discharge: 2019-10-12 | DRG: 064 | Disposition: A | Discharge status: Home Health | Payer: Medicare Other | Attending: Internal Medicine | Admitting: Internal Medicine

## 2019-09-30 ENCOUNTER — Encounter: Payer: Self-pay | Admitting: Internal Medicine

## 2019-09-30 ENCOUNTER — Encounter (HOSPITAL_COMMUNITY): Payer: Self-pay | Admitting: Emergency Medicine

## 2019-09-30 ENCOUNTER — Other Ambulatory Visit: Payer: Self-pay

## 2019-09-30 DIAGNOSIS — E213 Hyperparathyroidism, unspecified: Secondary | ICD-10-CM | POA: Diagnosis present

## 2019-09-30 DIAGNOSIS — R402142 Coma scale, eyes open, spontaneous, at arrival to emergency department: Secondary | ICD-10-CM | POA: Diagnosis present

## 2019-09-30 DIAGNOSIS — R402362 Coma scale, best motor response, obeys commands, at arrival to emergency department: Secondary | ICD-10-CM | POA: Diagnosis present

## 2019-09-30 DIAGNOSIS — Z833 Family history of diabetes mellitus: Secondary | ICD-10-CM

## 2019-09-30 DIAGNOSIS — I700 Atherosclerosis of aorta: Secondary | ICD-10-CM | POA: Diagnosis present

## 2019-09-30 DIAGNOSIS — Z743 Need for continuous supervision: Secondary | ICD-10-CM | POA: Diagnosis not present

## 2019-09-30 DIAGNOSIS — I120 Hypertensive chronic kidney disease with stage 5 chronic kidney disease or end stage renal disease: Secondary | ICD-10-CM | POA: Diagnosis not present

## 2019-09-30 DIAGNOSIS — E43 Unspecified severe protein-calorie malnutrition: Secondary | ICD-10-CM | POA: Diagnosis not present

## 2019-09-30 DIAGNOSIS — E875 Hyperkalemia: Secondary | ICD-10-CM | POA: Diagnosis not present

## 2019-09-30 DIAGNOSIS — R4182 Altered mental status, unspecified: Secondary | ICD-10-CM | POA: Diagnosis not present

## 2019-09-30 DIAGNOSIS — D631 Anemia in chronic kidney disease: Secondary | ICD-10-CM | POA: Diagnosis not present

## 2019-09-30 DIAGNOSIS — I959 Hypotension, unspecified: Secondary | ICD-10-CM | POA: Diagnosis not present

## 2019-09-30 DIAGNOSIS — G9341 Metabolic encephalopathy: Secondary | ICD-10-CM | POA: Diagnosis present

## 2019-09-30 DIAGNOSIS — N2589 Other disorders resulting from impaired renal tubular function: Secondary | ICD-10-CM | POA: Diagnosis not present

## 2019-09-30 DIAGNOSIS — Z86010 Personal history of colonic polyps: Secondary | ICD-10-CM

## 2019-09-30 DIAGNOSIS — E119 Type 2 diabetes mellitus without complications: Secondary | ICD-10-CM | POA: Diagnosis not present

## 2019-09-30 DIAGNOSIS — N2581 Secondary hyperparathyroidism of renal origin: Secondary | ICD-10-CM | POA: Diagnosis not present

## 2019-09-30 DIAGNOSIS — R29701 NIHSS score 1: Secondary | ICD-10-CM | POA: Diagnosis present

## 2019-09-30 DIAGNOSIS — Z515 Encounter for palliative care: Secondary | ICD-10-CM | POA: Diagnosis not present

## 2019-09-30 DIAGNOSIS — Z8673 Personal history of transient ischemic attack (TIA), and cerebral infarction without residual deficits: Secondary | ICD-10-CM | POA: Insufficient documentation

## 2019-09-30 DIAGNOSIS — F0390 Unspecified dementia without behavioral disturbance: Secondary | ICD-10-CM | POA: Diagnosis present

## 2019-09-30 DIAGNOSIS — Z7189 Other specified counseling: Secondary | ICD-10-CM

## 2019-09-30 DIAGNOSIS — E1151 Type 2 diabetes mellitus with diabetic peripheral angiopathy without gangrene: Secondary | ICD-10-CM | POA: Diagnosis present

## 2019-09-30 DIAGNOSIS — G9340 Encephalopathy, unspecified: Secondary | ICD-10-CM | POA: Diagnosis present

## 2019-09-30 DIAGNOSIS — R197 Diarrhea, unspecified: Secondary | ICD-10-CM | POA: Diagnosis present

## 2019-09-30 DIAGNOSIS — I10 Essential (primary) hypertension: Secondary | ICD-10-CM | POA: Diagnosis present

## 2019-09-30 DIAGNOSIS — D3A092 Benign carcinoid tumor of the stomach: Secondary | ICD-10-CM | POA: Diagnosis present

## 2019-09-30 DIAGNOSIS — D638 Anemia in other chronic diseases classified elsewhere: Secondary | ICD-10-CM | POA: Diagnosis present

## 2019-09-30 DIAGNOSIS — Z23 Encounter for immunization: Secondary | ICD-10-CM | POA: Diagnosis not present

## 2019-09-30 DIAGNOSIS — Z20828 Contact with and (suspected) exposure to other viral communicable diseases: Secondary | ICD-10-CM | POA: Diagnosis present

## 2019-09-30 DIAGNOSIS — M1990 Unspecified osteoarthritis, unspecified site: Secondary | ICD-10-CM | POA: Diagnosis present

## 2019-09-30 DIAGNOSIS — R410 Disorientation, unspecified: Secondary | ICD-10-CM | POA: Diagnosis not present

## 2019-09-30 DIAGNOSIS — Z96653 Presence of artificial knee joint, bilateral: Secondary | ICD-10-CM | POA: Diagnosis present

## 2019-09-30 DIAGNOSIS — E8809 Other disorders of plasma-protein metabolism, not elsewhere classified: Secondary | ICD-10-CM | POA: Diagnosis not present

## 2019-09-30 DIAGNOSIS — K862 Cyst of pancreas: Secondary | ICD-10-CM | POA: Diagnosis not present

## 2019-09-30 DIAGNOSIS — R402242 Coma scale, best verbal response, confused conversation, at arrival to emergency department: Secondary | ICD-10-CM | POA: Diagnosis present

## 2019-09-30 DIAGNOSIS — Z961 Presence of intraocular lens: Secondary | ICD-10-CM | POA: Diagnosis not present

## 2019-09-30 DIAGNOSIS — Z96641 Presence of right artificial hip joint: Secondary | ICD-10-CM | POA: Diagnosis present

## 2019-09-30 DIAGNOSIS — Z8249 Family history of ischemic heart disease and other diseases of the circulatory system: Secondary | ICD-10-CM

## 2019-09-30 DIAGNOSIS — Z6824 Body mass index (BMI) 24.0-24.9, adult: Secondary | ICD-10-CM

## 2019-09-30 DIAGNOSIS — K219 Gastro-esophageal reflux disease without esophagitis: Secondary | ICD-10-CM | POA: Diagnosis not present

## 2019-09-30 DIAGNOSIS — Z9842 Cataract extraction status, left eye: Secondary | ICD-10-CM

## 2019-09-30 DIAGNOSIS — N186 End stage renal disease: Secondary | ICD-10-CM | POA: Diagnosis not present

## 2019-09-30 DIAGNOSIS — Z888 Allergy status to other drugs, medicaments and biological substances status: Secondary | ICD-10-CM

## 2019-09-30 DIAGNOSIS — E785 Hyperlipidemia, unspecified: Secondary | ICD-10-CM | POA: Diagnosis not present

## 2019-09-30 DIAGNOSIS — Z79899 Other long term (current) drug therapy: Secondary | ICD-10-CM

## 2019-09-30 DIAGNOSIS — M109 Gout, unspecified: Secondary | ICD-10-CM | POA: Diagnosis present

## 2019-09-30 DIAGNOSIS — M8580 Other specified disorders of bone density and structure, unspecified site: Secondary | ICD-10-CM | POA: Diagnosis not present

## 2019-09-30 DIAGNOSIS — K2100 Gastro-esophageal reflux disease with esophagitis, without bleeding: Secondary | ICD-10-CM | POA: Diagnosis not present

## 2019-09-30 DIAGNOSIS — K2970 Gastritis, unspecified, without bleeding: Secondary | ICD-10-CM | POA: Diagnosis not present

## 2019-09-30 DIAGNOSIS — I6389 Other cerebral infarction: Secondary | ICD-10-CM | POA: Diagnosis not present

## 2019-09-30 DIAGNOSIS — Z992 Dependence on renal dialysis: Secondary | ICD-10-CM

## 2019-09-30 DIAGNOSIS — I6310 Cerebral infarction due to embolism of unspecified precerebral artery: Secondary | ICD-10-CM | POA: Diagnosis not present

## 2019-09-30 DIAGNOSIS — I639 Cerebral infarction, unspecified: Secondary | ICD-10-CM | POA: Diagnosis not present

## 2019-09-30 DIAGNOSIS — I6349 Cerebral infarction due to embolism of other cerebral artery: Secondary | ICD-10-CM | POA: Diagnosis not present

## 2019-09-30 DIAGNOSIS — D509 Iron deficiency anemia, unspecified: Secondary | ICD-10-CM | POA: Diagnosis not present

## 2019-09-30 DIAGNOSIS — E1122 Type 2 diabetes mellitus with diabetic chronic kidney disease: Secondary | ICD-10-CM | POA: Diagnosis not present

## 2019-09-30 DIAGNOSIS — I6340 Cerebral infarction due to embolism of unspecified cerebral artery: Principal | ICD-10-CM | POA: Diagnosis present

## 2019-09-30 DIAGNOSIS — R4020 Unspecified coma: Secondary | ICD-10-CM | POA: Diagnosis not present

## 2019-09-30 DIAGNOSIS — R82998 Other abnormal findings in urine: Secondary | ICD-10-CM | POA: Diagnosis not present

## 2019-09-30 DIAGNOSIS — R531 Weakness: Secondary | ICD-10-CM | POA: Diagnosis not present

## 2019-09-30 DIAGNOSIS — Z7982 Long term (current) use of aspirin: Secondary | ICD-10-CM

## 2019-09-30 LAB — COMPREHENSIVE METABOLIC PANEL
ALT: 14 U/L (ref 0–44)
AST: 16 U/L (ref 15–41)
Albumin: 1.9 g/dL — ABNORMAL LOW (ref 3.5–5.0)
Alkaline Phosphatase: 141 U/L — ABNORMAL HIGH (ref 38–126)
Anion gap: 14 (ref 5–15)
BUN: 32 mg/dL — ABNORMAL HIGH (ref 8–23)
CO2: 26 mmol/L (ref 22–32)
Calcium: 10.6 mg/dL — ABNORMAL HIGH (ref 8.9–10.3)
Chloride: 95 mmol/L — ABNORMAL LOW (ref 98–111)
Creatinine, Ser: 7.3 mg/dL — ABNORMAL HIGH (ref 0.44–1.00)
GFR calc Af Amer: 6 mL/min — ABNORMAL LOW (ref >60)
GFR calc non Af Amer: 5 mL/min — ABNORMAL LOW (ref >60)
Glucose, Bld: 152 mg/dL — ABNORMAL HIGH (ref 70–99)
Potassium: 4.5 mmol/L (ref 3.5–5.1)
Sodium: 135 mmol/L (ref 135–145)
Total Bilirubin: 0.3 mg/dL (ref 0.3–1.2)
Total Protein: 5.4 g/dL — ABNORMAL LOW (ref 6.5–8.1)

## 2019-09-30 LAB — PHOSPHORUS: Phosphorus: 2.2 mg/dL — ABNORMAL LOW (ref 2.5–4.6)

## 2019-09-30 LAB — CBC WITH DIFFERENTIAL/PLATELET
Abs Immature Granulocytes: 0.14 10*3/uL — ABNORMAL HIGH (ref 0.00–0.07)
Basophils Absolute: 0 10*3/uL (ref 0.0–0.1)
Basophils Relative: 0 %
Eosinophils Absolute: 0 10*3/uL (ref 0.0–0.5)
Eosinophils Relative: 0 %
HCT: 37.5 % (ref 36.0–46.0)
Hemoglobin: 11 g/dL — ABNORMAL LOW (ref 12.0–15.0)
Immature Granulocytes: 1 %
Lymphocytes Relative: 9 %
Lymphs Abs: 1.2 10*3/uL (ref 0.7–4.0)
MCH: 29.9 pg (ref 26.0–34.0)
MCHC: 29.3 g/dL — ABNORMAL LOW (ref 30.0–36.0)
MCV: 101.9 fL — ABNORMAL HIGH (ref 80.0–100.0)
Monocytes Absolute: 0.6 10*3/uL (ref 0.1–1.0)
Monocytes Relative: 5 %
Neutro Abs: 11.6 10*3/uL — ABNORMAL HIGH (ref 1.7–7.7)
Neutrophils Relative %: 85 %
Platelets: 293 10*3/uL (ref 150–400)
RBC: 3.68 MIL/uL — ABNORMAL LOW (ref 3.87–5.11)
RDW: 17.3 % — ABNORMAL HIGH (ref 11.5–15.5)
WBC: 13.6 10*3/uL — ABNORMAL HIGH (ref 4.0–10.5)
nRBC: 0.1 % (ref 0.0–0.2)

## 2019-09-30 LAB — AMMONIA: Ammonia: 16 umol/L (ref 9–35)

## 2019-09-30 LAB — PROTIME-INR
INR: 0.9 (ref 0.8–1.2)
Prothrombin Time: 12.1 seconds (ref 11.4–15.2)

## 2019-09-30 LAB — MAGNESIUM: Magnesium: 3.2 mg/dL — ABNORMAL HIGH (ref 1.7–2.4)

## 2019-09-30 LAB — CBG MONITORING, ED: Glucose-Capillary: 139 mg/dL — ABNORMAL HIGH (ref 70–99)

## 2019-09-30 MED ORDER — ROSUVASTATIN CALCIUM 5 MG PO TABS
5.0000 mg | ORAL_TABLET | Freq: Every day | ORAL | Status: DC
  Administered 2019-10-01 – 2019-10-08 (×8): 5 mg via ORAL
  Filled 2019-09-30 (×8): qty 1

## 2019-09-30 MED ORDER — INSULIN ASPART 100 UNIT/ML ~~LOC~~ SOLN
0.0000 [IU] | Freq: Three times a day (TID) | SUBCUTANEOUS | Status: DC
  Administered 2019-10-01 – 2019-10-03 (×4): 2 [IU] via SUBCUTANEOUS
  Administered 2019-10-05: 1 [IU] via SUBCUTANEOUS
  Administered 2019-10-05 – 2019-10-07 (×3): 2 [IU] via SUBCUTANEOUS
  Administered 2019-10-08 – 2019-10-10 (×4): 1 [IU] via SUBCUTANEOUS
  Administered 2019-10-11: 2 [IU] via SUBCUTANEOUS
  Administered 2019-10-11: 1 [IU] via SUBCUTANEOUS

## 2019-09-30 MED ORDER — ONDANSETRON HCL 4 MG PO TABS: 4.0000 mg | ORAL_TABLET | Freq: Four times a day (QID) | ORAL | Status: DC | PRN

## 2019-09-30 MED ORDER — NEPRO/CARBSTEADY PO LIQD
237.0000 mL | Freq: Three times a day (TID) | ORAL | Status: DC
  Administered 2019-10-01 – 2019-10-02 (×5): 237 mL via ORAL
  Filled 2019-09-30: qty 237

## 2019-09-30 MED ORDER — ONDANSETRON HCL 4 MG/2ML IJ SOLN
4.0000 mg | Freq: Four times a day (QID) | INTRAMUSCULAR | Status: DC | PRN
  Administered 2019-10-01 – 2019-10-02 (×2): 4 mg via INTRAVENOUS
  Filled 2019-09-30 (×3): qty 2

## 2019-09-30 MED ORDER — ASPIRIN EC 81 MG PO TBEC
81.0000 mg | DELAYED_RELEASE_TABLET | Freq: Every day | ORAL | Status: DC
  Administered 2019-10-01 – 2019-10-11 (×11): 81 mg via ORAL
  Filled 2019-09-30 (×11): qty 1

## 2019-09-30 MED ORDER — OMEGA-3-ACID ETHYL ESTERS 1 G PO CAPS
1.0000 g | ORAL_CAPSULE | Freq: Every day | ORAL | Status: DC
  Administered 2019-10-01 – 2019-10-11 (×10): 1 g via ORAL
  Filled 2019-09-30 (×11): qty 1

## 2019-09-30 MED ORDER — ACETAMINOPHEN 325 MG PO TABS: 650.0000 mg | ORAL_TABLET | Freq: Four times a day (QID) | ORAL | Status: DC | PRN

## 2019-09-30 MED ORDER — ALLOPURINOL 300 MG PO TABS
300.0000 mg | ORAL_TABLET | Freq: Every day | ORAL | Status: DC
  Administered 2019-10-01 – 2019-10-11 (×10): 300 mg via ORAL
  Filled 2019-09-30 (×11): qty 1

## 2019-09-30 MED ORDER — SEVELAMER CARBONATE 800 MG PO TABS
800.0000 mg | ORAL_TABLET | Freq: Three times a day (TID) | ORAL | Status: DC
  Administered 2019-10-01 – 2019-10-04 (×7): 800 mg via ORAL
  Filled 2019-09-30 (×7): qty 1

## 2019-09-30 MED ORDER — HEPARIN SODIUM (PORCINE) 5000 UNIT/ML IJ SOLN
5000.0000 [IU] | Freq: Three times a day (TID) | INTRAMUSCULAR | Status: DC
  Administered 2019-10-01 – 2019-10-02 (×4): 5000 [IU] via SUBCUTANEOUS
  Filled 2019-09-30 (×4): qty 1

## 2019-09-30 MED ORDER — ACETAMINOPHEN 650 MG RE SUPP: 650.0000 mg | Freq: Four times a day (QID) | RECTAL | Status: DC | PRN

## 2019-09-30 MED ORDER — PANTOPRAZOLE SODIUM 40 MG PO TBEC
40.0000 mg | DELAYED_RELEASE_TABLET | Freq: Two times a day (BID) | ORAL | Status: DC
  Administered 2019-10-01 – 2019-10-11 (×21): 40 mg via ORAL
  Filled 2019-09-30 (×5): qty 1
  Filled 2019-09-30: qty 2
  Filled 2019-09-30 (×16): qty 1

## 2019-09-30 NOTE — H&P (Signed)
History and Physical    Eileen Perez SEG:[id number] DOB: 05-12-1941 DOA: 09/30/2019  PCP: Unk Pinto, MD  Patient coming from: Home.  Chief Complaint: Increasing confusion.  History obtained from patient's granddaughter.  HPI: Eileen Perez is a 78 y.o. 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 difficile and presently on oral vancomycin still has at least one episode of diarrhea prior to visit last month patient also was admitted for partial small bowel obstruction also had a EGD that showed Schatzki's ring on Protonix was brought to the ER after patient's granddaughter found that patient was increasingly confused since this morning.  Has not had any nausea vomiting diarrhea abdominal pain chest pain fever chills or any falls.  Was not following commands according to patient's home health aide and was referred to the ER.  ED Course: In the ER patient is oriented to her name CT head is unremarkable ammonia levels were negative.  Per patient's granddaughter patient is not able to do her peritoneal dialysis as instructed.  Lab work show COVID-19 was negative.  EKG shows normal sinus rhythm.  Chest x-ray unremarkable.  Potassium 4.5 creatinine 7.3 phosphorus 2.2 magnesium 3.2 hemoglobin 11 platelets 193 WBC 13.6.  Chest x-ray unremarkable.  Patient admitted for acute encephalopathy.  ER physician does not discuss with on-call nephrologist who at this time advised that patient will need to be converted to hemodialysis since patient is getting confused and not able to follow commands and lives alone.  Review of Systems: As per HPI, rest all negative.   Past Medical History:  Diagnosis Date  . Anemia   . Arthritis    Osteoarthritis  . Chronic kidney disease    Chronic renal insufficiency, peritoneal dialysis qday sees dr  Moshe Cipro  . Diabetes mellitus    Pre  . Diverticulosis   . GERD (gastroesophageal reflux disease)   . Gout   . Hiatal hernia   . History of blood transfusion   . Hyperlipidemia   . Hypertension   . Osteopenia   . Pancreatic cyst 1999  . Thyroid disease    Hyperparathyroid     Past Surgical History:  Procedure Laterality Date  . BIOPSY  04/25/2019   Procedure: BIOPSY;  Surgeon: Otis Brace, MD;  Location: [facility];  Service: Gastroenterology;;  . BIOPSY  09/02/2019   Procedure: BIOPSY;  Surgeon: Otis Brace, MD;  Location: [place];  Service: Gastroenterology;;  . CHOLECYSTECTOMY    . COLONOSCOPY  03/21/12   Next one in 2018  . COLONOSCOPY WITH PROPOFOL N/A 04/25/2019   Procedure: COLONOSCOPY WITH PROPOFOL;  Surgeon: Otis Brace, MD;  Location: [facility];  Service: Gastroenterology;  Laterality: N/A;  . ESOPHAGOGASTRODUODENOSCOPY (EGD) WITH PROPOFOL N/A 04/25/2019   Procedure: ESOPHAGOGASTRODUODENOSCOPY (EGD) WITH PROPOFOL;  Surgeon: Otis Brace, MD;  Location: [facility];  Service: Gastroenterology;  Laterality: N/A;  . ESOPHAGOGASTRODUODENOSCOPY (EGD) WITH PROPOFOL N/A 09/02/2019   Procedure: ESOPHAGOGASTRODUODENOSCOPY (EGD) WITH PROPOFOL;  Surgeon: Otis Brace, MD;  Location: [facility];  Service: Gastroenterology;  Laterality: N/A;  . EYE SURGERY Left    cataract extraction with IOL  . INSERTION OF DIALYSIS CATHETER N/A 02/15/2017   Procedure: REVISION OF DIALYSIS CATHETER;  Surgeon: Algernon Huxley, MD;  Location: [facility];  Service: Cardiovascular;  Laterality: N/A;  . IR FLUORO GUIDE CV LINE RIGHT  08/29/2019  . IR US GUIDE VASC  ACCESS RIGHT  08/29/2019  . JOINT REPLACEMENT  2012   left knee  . left knee replacement     . PANCREATIC CYST EXCISION  1999  . POLYPECTOMY  04/25/2019   Procedure: POLYPECTOMY;  Surgeon: Otis Brace, MD;  Location: [facility];  Service: Gastroenterology;;  . Clide Deutscher  04/25/2019   Procedure:  SCLEROTHERAPY;  Surgeon: Otis Brace, MD;  Location: [facility];  Service: Gastroenterology;;  . TOTAL HIP ARTHROPLASTY Right 05/26/2015   Procedure: RIGHT TOTAL HIP ARTHROPLASTY ANTERIOR APPROACH;  Surgeon: Gaynelle Arabian, MD;  Location: [facility];  Service: Orthopedics;  Laterality: Right;  . TOTAL KNEE ARTHROPLASTY Right 06/09/2013   Procedure: RIGHT TOTAL KNEE ARTHROPLASTY;  Surgeon: Gearlean Alf, MD;  Location: [facility];  Service: Orthopedics;  Laterality: Right;     reports that she has never smoked. She has never used smokeless tobacco. She reports that she does not drink alcohol or use drugs.  Allergies  Allergen Reactions  . Ace Inhibitors Other (See Comments)    unknown  . Nsaids Other (See Comments)    unknown    Family History  Problem Relation Age of Onset  . Cancer Father        Prostate and Throat  . Hypertension Mother   . Cancer Mother        Colon with METS  . Diabetes Mother     Prior to Admission medications   Medication Sig Start Date End Date Taking? Authorizing Provider  allopurinol (ZYLOPRIM) 300 MG tablet Take 1 tablet daily for gout 09/16/19   Unk Pinto, MD  alum & mag hydroxide-simeth (MAALOX/MYLANTA) 200-200-20 MG/5ML suspension Take 15 mLs by mouth 3 (three) times daily before meals. 09/14/19 10/14/19  Nicole Kindred A, DO  aspirin 81 MG tablet Take 81 mg by mouth at bedtime.     [provider]  b complex-vitamin c-folic acid (NEPHRO-VITE) 0.8 MG TABS tablet Take 1 tablet by mouth at bedtime.    [provider]  Blood Glucose Monitoring Suppl DEVI Check blood glucose before meals, at bedtime and as needed. 09/18/19   McClanahan, Danton Sewer, NP  Flaxseed, Linseed, (FLAXSEED OIL) 1000 MG CAPS Take 1 capsule by mouth daily.     [provider]  gentamicin cream (GARAMYCIN) 0.1 % Apply 1 application topically daily. 09/14/19   Nicole Kindred A, DO  glucose blood test strip Use as instructed 09/18/19   Garnet Sierras, NP   Lancets (ACCU-CHEK SOFT TOUCH) lancets Use as instructed 09/18/19   Garnet Sierras, NP  lidocaine (XYLOCAINE) 2 % solution Use as directed 15 mLs in the mouth or throat 3 (three) times daily before meals. 09/14/19 10/14/19  Ezekiel Slocumb, DO  Nutritional Supplements (FEEDING SUPPLEMENT, NEPRO CARB STEADY,) LIQD Take 237 mLs by mouth 3 (three) times daily between meals. 09/18/19 10/18/19  Garnet Sierras, NP  Omega-3 Fatty Acids (FISH OIL) 1200 MG CAPS Take 1,200 mg by mouth daily.     [provider]  ondansetron (ZOFRAN-ODT) 4 MG disintegrating tablet Take 1 tablet every 8 hours as needed for nausea and vomiting. 09/16/19   Unk Pinto, MD  pantoprazole (PROTONIX) 40 MG tablet Takes 1 tablet twice a day for reflux. 09/18/19   Garnet Sierras, NP  Potassium Chloride ER 20 MEQ TBCR Takes 1 tablet daily. 09/16/19   Unk Pinto, MD  rosuvastatin (CRESTOR) 5 MG tablet Take 1 tablet after supper for cholesterol. 09/16/19   Unk Pinto, MD  sevelamer carbonate (RENVELA) 800 MG tablet Take 800 mg by  mouth 3 (three) times daily with meals.     [provider]  ZINC GLUCONATE PO Take 40 mg by mouth daily.     [provider]    Physical Exam: Constitutional: Moderately built and nourished. Vitals:   09/30/19 1407 09/30/19 1408 09/30/19 1415 09/30/19 1900  BP:  100/66 118/70 97/60  Pulse:  100  85  Resp:  20 15 18   Temp: 97.8 F (36.6 C)     TempSrc: Oral     SpO2:  97%  98%   Eyes: Anicteric no pallor. ENMT: No discharge from the ears eyes nose or mouth. Neck: No mass felt.  No neck rigidity. Respiratory: No rhonchi or crepitations. Cardiovascular: S1-S2 heard. Abdomen: Soft nontender bowel sounds present. Musculoskeletal: No edema.  No joint effusion. Skin: No rash. Neurologic: Alert awake oriented to name moves all extremities.  Pupils equal and reactive light. Psychiatric: Oriented to name.   Labs on Admission: I have personally  reviewed following labs and imaging studies  CBC: Recent Labs  Lab 09/30/19 1442  WBC 13.6*  NEUTROABS 11.6*  HGB 11.0*  HCT 37.5  MCV 101.9*  PLT 532   Basic Metabolic Panel: Recent Labs  Lab 09/30/19 1442  NA 135  K 4.5  CL 95*  CO2 26  GLUCOSE 152*  BUN 32*  CREATININE 7.30*  CALCIUM 10.6*  MG 3.2*  PHOS 2.2*   GFR: Estimated Creatinine Clearance: 4.7 mL/min (A) (by C-G formula based on SCr of 7.3 mg/dL (H)). Liver Function Tests: Recent Labs  Lab 09/30/19 1442  AST 16  ALT 14  ALKPHOS 141*  BILITOT 0.3  PROT 5.4*  ALBUMIN 1.9*   No results for input(s): LIPASE, AMYLASE in the last 168 hours. Recent Labs  Lab 09/30/19 1529  AMMONIA 16   Coagulation Profile: Recent Labs  Lab 09/30/19 1442  INR 0.9   Cardiac Enzymes: No results for input(s): CKTOTAL, CKMB, CKMBINDEX, TROPONINI in the last 168 hours. BNP (last 3 results) No results for input(s): PROBNP in the last 8760 hours. HbA1C: No results for input(s): HGBA1C in the last 72 hours. CBG: Recent Labs  Lab 09/30/19 1408  GLUCAP 139*   Lipid Profile: No results for input(s): CHOL, HDL, LDLCALC, TRIG, CHOLHDL, LDLDIRECT in the last 72 hours. Thyroid Function Tests: No results for input(s): TSH, T4TOTAL, FREET4, T3FREE, THYROIDAB in the last 72 hours. Anemia Panel: No results for input(s): VITAMINB12, FOLATE, FERRITIN, TIBC, IRON, RETICCTPCT in the last 72 hours. Urine analysis:    Component Value Date/Time   COLORURINE YELLOW 03/25/2019 1548   APPEARANCEUR TURBID (A) 03/25/2019 1548   LABSPEC 1.007 03/25/2019 1548   PHURINE 7.0 03/25/2019 1548   GLUCOSEU NEGATIVE 03/25/2019 1548   HGBUR 2+ (A) 03/25/2019 1548   BILIRUBINUR NEGATIVE 12/08/2016 1115   KETONESUR NEGATIVE 03/25/2019 1548   PROTEINUR 2+ (A) 03/25/2019 1548   UROBILINOGEN 0.2 05/19/2015 0942   NITRITE NEGATIVE 03/25/2019 1548   LEUKOCYTESUR 3+ (A) 03/25/2019 1548   Sepsis Labs: @LABRCNTIP (procalcitonin:4,lacticidven:4)  )No results found for this or any previous visit (from the past 240 hour(s)).   Radiological Exams on Admission: Ct Head Wo Contrast  Result Date: 09/30/2019 CLINICAL DATA:  Altered level of consciousness. EXAM: CT HEAD WITHOUT CONTRAST TECHNIQUE: Contiguous axial images were obtained from the base of the skull through the vertex without intravenous contrast. COMPARISON:  September 10, 2015. FINDINGS: Brain: Mild diffuse cortical atrophy is noted. Mild chronic ischemic white matter disease is noted. No mass effect or midline  shift is noted. Ventricular size is within normal limits. There is no evidence of mass lesion, hemorrhage or acute infarction. Vascular: No hyperdense vessel or unexpected calcification. Skull: Normal. Negative for fracture or focal lesion. Sinuses/Orbits: Stable left maxillary mucous retention cyst is noted. Other: None. IMPRESSION: Mild diffuse cortical atrophy. Mild chronic ischemic white matter disease. No acute intracranial abnormality seen. Electronically Signed   By: Marijo Conception M.D.   On: 09/30/2019 15:12   Dg Chest Port 1 View  Result Date: 09/30/2019 CLINICAL DATA:  Altered EXAM: PORTABLE CHEST 1 VIEW COMPARISON:  Chest radiograph 09/02/2019 FINDINGS: Heart size within normal limits.  Aortic atherosclerosis. There is no focal consolidation within the lungs. No evidence of pleural effusion or pneumothorax. No acute bony abnormality. Overlying monitoring leads. IMPRESSION: No evidence of acute cardiopulmonary disease. Aortic atherosclerosis. Electronically Signed   By: Kellie Simmering DO   On: 09/30/2019 15:38    EKG: Independently reviewed.  Normal sinus rhythm.  Assessment/Plan Principal Problem:   Acute encephalopathy Active Problems:   Essential hypertension   Anemia of chronic Renal Dz   ESRD (end stage renal disease) (HCC)   Carcinoid tumor of stomach    1. Acute encephalopathy -patient appears nonfocal.  Cause not clear.  May be uremia.  If confusion  persist after hemodialysis may consider MRI brain. 2. ESRD on peritoneal dialysis nephrology considering to change to hemodialysis. 3. Anemia likely from ESRD follow CBC. 4. Recent diagnosis of C. difficile still has at least 1 episode of diarrhea daily.  I have placed patient on enteric precaution and p.o. vancomycin which I have discussed with pharmacy to dose. 5. History of carcinoid tumor of the stomach being followed by oncologist. 6. Hyperlipidemia on statins.   DVT prophylaxis: Heparin. Code Status: Full code. Family Communication: Patient's granddaughter. Disposition Plan: Home. Consults called: ER physician discussed with nephrologist. Admission status: Observation.   Rise Patience MD [facility] Pager [phone].  If 7PM-7AM, please contact night-coverage [email or web address] Password [email or web address]  09/30/2019, 10:18 PM

## 2019-09-30 NOTE — Evaluation (Signed)
Physical Therapy Evaluation Patient Details Name: Eileen Perez MRN: [id number] DOB: July 22, 1941 Today's Date: 09/30/2019   History of Present Illness  78 y.o. 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  Clinical Impression  Patient presents with mobility little less safe than it was during last admission 2 weeks ago.  Now needing minguard A for safety with bed mobility and slower with ambulation.  Grandaughter expressing concern for her decline in addition to concern for her AMS.  Currently needs close 24 hour assist and family travelling from a distance taking turns staying with her.  May benefit from SNF level rehab stay to progress mobility and for safety.  Unsure if they can continue PD in SNF and grandaughter not sure about her capacity to tolerate HD.  Will follow up if remains in the hosptial.      Follow Up Recommendations SNF    Equipment Recommendations  None recommended by PT    Recommendations for Other Services       Precautions / Restrictions Precautions Precautions: Fall      Mobility  Bed Mobility Overal bed mobility: Needs Assistance Bed Mobility: Supine to Sit;Sit to Supine     Supine to sit: Min guard Sit to supine: Min guard   General bed mobility comments: assist for safety/balance  Transfers Overall transfer level: Needs assistance Equipment used: Rolling walker (2 wheeled) Transfers: Sit to/from Stand Sit to Stand: Min guard         General transfer comment: assist for balance  Ambulation/Gait Ambulation/Gait assistance: Min guard;Supervision Gait Distance (Feet): 150 Feet Assistive device: Rolling walker (2 wheeled) Gait Pattern/deviations: Step-through pattern;Decreased  stride length     General Gait Details: slow and hesitant assist for directions  Stairs            Wheelchair Mobility    Modified Rankin (Stroke Patients Only)       Balance Overall balance assessment: Needs assistance   Sitting balance-Leahy Scale: Good     Standing balance support: Bilateral upper extremity supported Standing balance-Leahy Scale: Poor Standing balance comment: UE support for balance                             Pertinent Vitals/Pain Pain Assessment: No/denies pain    Home Living Family/patient expects to be discharged to:: Private residence Living Arrangements: Alone Available Help at Discharge: Family;Available 24 hours/day Type of Home: House Home Access: Stairs to enter   [employer] of Steps: 1 Home Layout: One level Home Equipment: Walker - 4 wheels;Bedside commode Additional Comments: Multiple children to rotate providing care as needed    Prior Function Level of Independence: Needs assistance   Gait / Transfers Assistance Needed: Ambulation with rollator. On peritoneal dialysis at home  ADL's / Homemaking Assistance Needed: grandaughter here from [place] to help for a week, then another family member will come to help  Comments: was getting HHPT/OT and had an aide, grandaughter not sure how often aide comes     Hand Dominance        Extremity/Trunk Assessment   Upper Extremity Assessment Upper Extremity Assessment: Generalized weakness    Lower Extremity Assessment Lower Extremity Assessment: Generalized weakness       Communication   Communication: No difficulties  Cognition Arousal/Alertness: Awake/alert Behavior  During Therapy: Flat affect Overall Cognitive Status: Impaired/Different from baseline Area of Impairment: Orientation;Memory;Safety/judgement;Following commands;Problem solving                 Orientation Level: Disoriented to;Place;Time;Situation   Memory: Decreased  short-term memory Following Commands: Follows one step commands consistently;Follows one step commands inconsistently Safety/Judgement: Decreased awareness of safety;Decreased awareness of deficits   Problem Solving: Slow processing;Requires verbal cues General Comments: difficulty squeezing my fingers, but able to perform other parts of extremity evaluation, states she is at physical therapy      General Comments General comments (skin integrity, edema, etc.): VSS after ambulation HR 103, SpO2 99% on RA, BP 117/73    Exercises     Assessment/Plan    PT Assessment Patient needs continued PT services  PT Problem List Decreased safety awareness;Decreased cognition;Decreased mobility;Decreased strength;Decreased balance;Decreased knowledge of precautions       PT Treatment Interventions DME instruction;Therapeutic activities;Balance training;Patient/family education;Therapeutic exercise;Functional mobility training;Gait training;Cognitive remediation    PT Goals (Current goals can be found in the Care Plan section)  Acute Rehab PT Goals Patient Stated Goal: unsure PT Goal Formulation: With patient/family Time For Goal Achievement: 10/14/19 Potential to Achieve Goals: Good    Frequency Min 3X/week   Barriers to discharge        Co-evaluation               AM-PAC PT "6 Clicks" Mobility  Outcome Measure Help needed turning from your back to your side while in a flat bed without using bedrails?: A Little Help needed moving from lying on your back to sitting on the side of a flat bed without using bedrails?: A Little Help needed moving to and from a bed to a chair (including a wheelchair)?: A Little Help needed standing up from a chair using your arms (e.g., wheelchair or bedside chair)?: A Little Help needed to walk in hospital room?: A Little Help needed climbing 3-5 steps with a railing? : A Little 6 Click Score: 18    End of Session   Activity Tolerance: Patient  tolerated treatment well Patient left: in bed;with call bell/phone within reach;with family/visitor present   PT Visit Diagnosis: Muscle weakness (generalized) (M62.81);Other symptoms and signs involving the nervous system (R29.898)    Time: 3833-3832 PT Time Calculation (min) (ACUTE ONLY): 28 min   Charges:   PT Evaluation $PT Eval Moderate Complexity: 1 Mod PT Treatments $Gait Training: 8-22 mins        Magda Kiel, [place] [facility] [phone] 09/30/2019   Reginia Naas 09/30/2019, 5:52 PM

## 2019-09-30 NOTE — ED Provider Notes (Signed)
I received the patient in signout from Dr. Kathrynn Humble.  Briefly the patient is a 78 year old female with a chief complaints of worsening mental status.  His plan is to evaluate the laboratory evaluation and if this is unremarkable then he would consult social work for evaluation for possible placement.  Lab work has returned and there is no significant change from her baseline.  Patient is confused on my exam.  I discussed case with social work who evaluated the patient.  Felt that she was currently not able to be placed due to her being on peritoneal dialysis.  This was discussed with the family who would like to take her home at this point however they are concerned because there is no way to get peritoneal dialysis done tonight. Recommended medical admission for transition to HD.  Admit.    Deno Etienne, DO 09/30/19 2125

## 2019-09-30 NOTE — Care Management (Addendum)
ED CM received verbal consult from Dr. Tyrone Nine concerning assistance with transitional care from the ED.  Patient presented today with change of mental status as per family.  Met with patient and grand daughter at bedside. Patient and family reports that patient has become increasingly weaker over the last month and is currently receiving HH with [place], and family from [place]. Have been rotating coming down caring for patient.  Patient is on PD dialysis nightly patient was able to do PD on her own until recently.  Her Dialysis Center is [facility] on [address].  ED evaluation still pending.  CM discussed the goals of care with patient and family.  Patient and family would like for patient to return home with supportive care. Discussed Palliative services and Private Duty. Patient and family are agreeable with the plan, also thinks patient would benefit from a 3n1 and a shower bench.   745: Discussed plan EDP and DME ordered, EDP evaluation was complete no new findings. Provided contact information for [employer] to pick up the 3n1 and tub bench tomorrow. Reviewed plan with patient and and family, however they have now expressed concerns over how would she receive PD tonight because patient is unable to perform the treatment.  ED CM contacted the [facility] [phone] spoke with Ayana RN who reported she could not find a clinic tonight in the [place] area that would be able to accommodate patient but will be able to explore this in the am.  Updated Dr. Tyrone Nine who will consult Nephrology.   9:01 Patient will be admitted and transitioned to HD as per Nephrologist.

## 2019-09-30 NOTE — ED Triage Notes (Addendum)
Pt arrives to ED from home where she lives alone with complaints of altered mental status with an unknown LKN. Per [facility] patient was last seen by daughter who lives out of town who noticed patient was a bit altered on this past Saturday. Per EMS patient was seen by a home health nurse today who also stated patient was altered and who called 911. Patient normally does her peritoneal dialysis at home everyday by herself and states she has not missed a day. At baseline patient is A&O x4. Patient is disoriented to situation, place, and time at this moment. Patient is alert to self only.

## 2019-09-30 NOTE — Progress Notes (Signed)
CANCELED

## 2019-09-30 NOTE — ED Notes (Signed)
Help get patient undress on the monitor did ekg shown to er doctor patient is resting with call bell in reach

## 2019-09-30 NOTE — ED Notes (Signed)
Social work at bedside.  

## 2019-09-30 NOTE — ED Provider Notes (Signed)
[facility] EMERGENCY DEPARTMENT Provider Note   CSN: [id number] Arrival date & time: 09/30/19  1405     History   Chief Complaint Chief Complaint  Patient presents with  . Altered Mental Status    HPI Eileen Perez is a 78 y.o. female.     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  Level 5 caveat for altered mental status I spoke with patient's daughter at [phone] and granddaughter at [phone]  Patient is sluggish.  She is able to tell me her name and her age, however she is slow to respond.  She is also able to tell me her daughter's name but again is slow to respond.  She has no complaints from her side but does agree that she is not as sharp as usual and also feeling little weak.  Patient's granddaughter is staying with her right now.  She reports that when she came to be with her grandmother few days ago she was surprised to see how quickly she had declined as far as her confusion is concerned.  Over the past 2 or 3 days that confusion has only gotten worse.  For example, patient would get confused in telling her the steps needed for peritoneal dialysis, which is normally not the case.  Patient allegedly was balancing her checkbook's and paying her bills without difficulty month ago, prior to the 2 admissions she had in October.  Patient does not make any urine.  Granddaughter has not appreciated any new cough, fevers, chills.  She had falls 4 to 6 weeks ago.  Past Medical History:  Diagnosis Date  . Anemia   . Arthritis    Osteoarthritis  . Chronic kidney disease    Chronic renal insufficiency, peritoneal dialysis qday sees dr Moshe Cipro  . Diabetes mellitus     Pre  . Diverticulosis   . GERD (gastroesophageal reflux disease)   . Gout   . Hiatal hernia   . History of blood transfusion   . Hyperlipidemia   . Hypertension   . Osteopenia   . Pancreatic cyst 1999  . Thyroid disease    Hyperparathyroid     Patient Active Problem List   Diagnosis Date Noted  . Intractable nausea and vomiting 09/11/2019  . Diarrhea 09/11/2019  . Hypomagnesemia 09/11/2019  . Pressure injury of skin 09/11/2019  . Clostridioides difficile infection 09/11/2019  . Dehydration   . Hypotension 09/10/2019  . Esophagitis, [place] grade B 09/06/2019  . Gastritis 09/06/2019  . Protein-calorie malnutrition, severe 08/29/2019  . Small bowel obstruction ([place]) 08/28/2019  . Schatzki's ring of distal esophagus 06/23/2019  . 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 History of colon polyps 06/23/2019  . Enrolled in chronic care management 02/20/2019  . Aortic atherosclerosis ([place]) 02/13/2018  . 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 Overweight (BMI 25.0-29.9) 10/31/2015  . ESRD (end stage renal disease) ([place]) 10/31/2015  . OA (osteoarthritis) of hip 05/26/2015  . Secondary hyperparathyroidism ([facility]) 04/16/2014  . Vitamin D deficiency 04/16/2014  . Hyperlipidemia, mixed 04/16/2014  . Medication management 04/16/2014  . Anemia of chronic Renal Dz   . Pancreatic cyst   . GERD   . Gout   . Osteopenia   . Essential hypertension 06/10/2013  . DJD 06/09/2013    Past Surgical History:  Procedure  Laterality Date  . BIOPSY  04/25/2019   Procedure: BIOPSY;  Surgeon: Otis Brace, MD;  Location: [facility];  Service: Gastroenterology;;  . BIOPSY  09/02/2019   Procedure: BIOPSY;  Surgeon: Otis Brace, MD;  Location: [place];  Service: Gastroenterology;;  . CHOLECYSTECTOMY    . COLONOSCOPY  03/21/12   Next one in 2018  . COLONOSCOPY WITH PROPOFOL N/A 04/25/2019   Procedure: COLONOSCOPY WITH PROPOFOL;  Surgeon: Otis Brace, MD;  [facility]: [facility];   Service: Gastroenterology;  Laterality: N/A;  . ESOPHAGOGASTRODUODENOSCOPY (EGD) WITH PROPOFOL N/A 04/25/2019   Procedure: ESOPHAGOGASTRODUODENOSCOPY (EGD) WITH PROPOFOL;  Surgeon: Otis Brace, MD;  [facility];  Service: Gastroenterology;  Laterality: N/A;  . ESOPHAGOGASTRODUODENOSCOPY (EGD) WITH PROPOFOL N/A 09/02/2019   Procedure: ESOPHAGOGASTRODUODENOSCOPY (EGD) WITH PROPOFOL;  Surgeon: Otis Brace, MD;  Location: [facility];  Service: Gastroenterology;  Laterality: N/A;  . EYE SURGERY Left    cataract extraction with IOL  . INSERTION OF DIALYSIS CATHETER N/A 02/15/2017   Procedure: REVISION OF DIALYSIS CATHETER;  Surgeon: Algernon Huxley, MD;  Location: [facility];  Service: Cardiovascular;  Laterality: N/A;  . IR FLUORO GUIDE CV LINE RIGHT  08/29/2019  . IR US GUIDE VASC ACCESS RIGHT  08/29/2019  . JOINT REPLACEMENT  2012   left knee  . left knee replacement     . PANCREATIC CYST EXCISION  1999  . POLYPECTOMY  04/25/2019   Procedure: POLYPECTOMY;  Surgeon: Otis Brace, MD;  [facility]: [facility];  Service: Gastroenterology;;  . Clide Deutscher  04/25/2019   Procedure: SCLEROTHERAPY;  Surgeon: Otis Brace, MD;  Location: [facility];  Service: Gastroenterology;;  . TOTAL HIP ARTHROPLASTY Right 05/26/2015   Procedure: RIGHT TOTAL HIP ARTHROPLASTY ANTERIOR APPROACH;  Surgeon: Gaynelle Arabian, MD;  Location: [facility];  Service: Orthopedics;  Laterality: Right;  . TOTAL KNEE ARTHROPLASTY Right 06/09/2013   Procedure: RIGHT TOTAL KNEE ARTHROPLASTY;  Surgeon: Gearlean Alf, MD;  Location: [facility];  Service: Orthopedics;  Laterality: Right;     OB History   No obstetric history on file.      Home Medications    Prior to Admission medications   Medication Sig Start Date End Date Taking? Authorizing Provider  allopurinol (ZYLOPRIM) 300 MG tablet Take 1 tablet daily for gout 09/16/19   Unk Pinto, MD  alum & mag hydroxide-simeth (MAALOX/MYLANTA) 200-200-20 MG/5ML  suspension Take 15 mLs by mouth 3 (three) times daily before meals. 09/14/19 10/14/19  Nicole Kindred A, DO  aspirin 81 MG tablet Take 81 mg by mouth at bedtime.     [provider]  b complex-vitamin c-folic acid (NEPHRO-VITE) 0.8 MG TABS tablet Take 1 tablet by mouth at bedtime.    [provider]  Blood Glucose Monitoring Suppl DEVI Check blood glucose before meals, at bedtime and as needed. 09/18/19   McClanahan, Danton Sewer, NP  Flaxseed, Linseed, (FLAXSEED OIL) 1000 MG CAPS Take 1 capsule by mouth daily.     [provider]  gentamicin cream (GARAMYCIN) 0.1 % Apply 1 application topically daily. 09/14/19   Nicole Kindred A, DO  glucose blood test strip Use as instructed 09/18/19   Garnet Sierras, NP  Lancets (ACCU-CHEK SOFT TOUCH) lancets Use as instructed 09/18/19   Garnet Sierras, NP  lidocaine (XYLOCAINE) 2 % solution Use as directed 15 mLs in the mouth or throat 3 (three) times daily before meals. 09/14/19 10/14/19  Ezekiel Slocumb, DO  Nutritional Supplements (FEEDING SUPPLEMENT, NEPRO CARB STEADY,) LIQD Take 237 mLs by mouth 3 (  three) times daily between meals. 09/18/19 10/18/19  Garnet Sierras, NP  Omega-3 Fatty Acids (FISH OIL) 1200 MG CAPS Take 1,200 mg by mouth daily.     [provider]  ondansetron (ZOFRAN-ODT) 4 MG disintegrating tablet Take 1 tablet every 8 hours as needed for nausea and vomiting. 09/16/19   Unk Pinto, MD  pantoprazole (PROTONIX) 40 MG tablet Takes 1 tablet twice a day for reflux. 09/18/19   Garnet Sierras, NP  Potassium Chloride ER 20 MEQ TBCR Takes 1 tablet daily. 09/16/19   Unk Pinto, MD  rosuvastatin (CRESTOR) 5 MG tablet Take 1 tablet after supper for cholesterol. 09/16/19   Unk Pinto, MD  sevelamer carbonate (RENVELA) 800 MG tablet Take 800 mg by mouth 3 (three) times daily with meals.     [provider]  ZINC GLUCONATE PO Take 40 mg by mouth daily.     [provider]     Family History Family History  Problem Relation Age of Onset  . Cancer Father        Prostate and Throat  . Hypertension Mother   . Cancer Mother        Colon with METS  . Diabetes Mother     Social History Social History   Tobacco Use  . Smoking status: Never Smoker  . Smokeless tobacco: Never Used  Substance Use Topics  . Alcohol use: No    Alcohol/week: 0.0 standard drinks  . Drug use: No     Allergies   Ace inhibitors and Nsaids   Review of Systems Review of Systems  Unable to perform ROS: Mental status change     Physical Exam Updated Vital Signs BP 100/66 (BP Location: Left Arm)   Pulse 100   Temp 97.8 F (36.6 C) (Oral)   Resp 20   SpO2 97%   Physical Exam Vitals signs and nursing note reviewed.  Constitutional:      Appearance: She is well-developed.     Comments: Somnolent  HENT:     Head: Normocephalic and atraumatic.  Eyes:     Extraocular Movements: Extraocular movements intact.     Pupils: Pupils are equal, round, and reactive to light.  Neck:     Musculoskeletal: Normal range of motion and neck supple.  Cardiovascular:     Rate and Rhythm: Normal rate.  Pulmonary:     Effort: Pulmonary effort is normal.  Abdominal:     General: Bowel sounds are normal.  Musculoskeletal:     Right lower leg: No edema.     Left lower leg: No edema.  Skin:    General: Skin is warm and dry.  Neurological:     Sensory: No sensory deficit.     Comments: Moving all 4 extremities      ED Treatments / Results  Labs (all labs ordered are listed, but only abnormal results are displayed) Labs Reviewed  CBG MONITORING, ED - Abnormal; Notable for the following components:      Result Value   Glucose-Capillary 139 (*)    All other components within normal limits    EKG None  Radiology No results found.  Procedures Procedures (including critical care time)  Medications Ordered in ED Medications - No data to display   Initial Impression /  Assessment and Plan / ED Course  I have reviewed the triage vital signs and the nursing notes.  Pertinent labs & imaging results that were available during my care of the patient were reviewed by me and  considered in my medical decision making (see chart for details).        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  It seems clinically that she has possibly new vascular dementia.  She does not carry that diagnosis therefore stroke is also in the differential diagnosis, along with metabolic derangement and infection.  History and exam are not suggestive of any infection.  Patient gets peritoneal dialysis but she has soft and nontender abdomen.  Basic labs have been ordered.  CT head has been ordered because of recent fall.  If her work-up is negative then she will need a social work consult for possible placement and family update.  Dr. Tyrone Nine to take over patient's care.   Final Clinical Impressions(s) / ED Diagnoses   Final diagnoses:  None    ED Discharge Orders    None       Varney Biles, MD 09/30/19 1538

## 2019-10-01 ENCOUNTER — Ambulatory Visit: Payer: Medicare Other | Admitting: Internal Medicine

## 2019-10-01 DIAGNOSIS — I6310 Cerebral infarction due to embolism of unspecified precerebral artery: Secondary | ICD-10-CM | POA: Diagnosis not present

## 2019-10-01 DIAGNOSIS — M109 Gout, unspecified: Secondary | ICD-10-CM | POA: Diagnosis present

## 2019-10-01 DIAGNOSIS — R82998 Other abnormal findings in urine: Secondary | ICD-10-CM | POA: Diagnosis not present

## 2019-10-01 DIAGNOSIS — I639 Cerebral infarction, unspecified: Secondary | ICD-10-CM | POA: Diagnosis not present

## 2019-10-01 DIAGNOSIS — N2589 Other disorders resulting from impaired renal tubular function: Secondary | ICD-10-CM | POA: Diagnosis not present

## 2019-10-01 DIAGNOSIS — N2581 Secondary hyperparathyroidism of renal origin: Secondary | ICD-10-CM | POA: Diagnosis not present

## 2019-10-01 DIAGNOSIS — Z7189 Other specified counseling: Secondary | ICD-10-CM | POA: Diagnosis not present

## 2019-10-01 DIAGNOSIS — E1122 Type 2 diabetes mellitus with diabetic chronic kidney disease: Secondary | ICD-10-CM | POA: Diagnosis not present

## 2019-10-01 DIAGNOSIS — Z96653 Presence of artificial knee joint, bilateral: Secondary | ICD-10-CM | POA: Diagnosis present

## 2019-10-01 DIAGNOSIS — R410 Disorientation, unspecified: Secondary | ICD-10-CM | POA: Diagnosis not present

## 2019-10-01 DIAGNOSIS — M8580 Other specified disorders of bone density and structure, unspecified site: Secondary | ICD-10-CM | POA: Diagnosis present

## 2019-10-01 DIAGNOSIS — I6389 Other cerebral infarction: Secondary | ICD-10-CM | POA: Diagnosis not present

## 2019-10-01 DIAGNOSIS — E43 Unspecified severe protein-calorie malnutrition: Secondary | ICD-10-CM | POA: Diagnosis present

## 2019-10-01 DIAGNOSIS — G9341 Metabolic encephalopathy: Secondary | ICD-10-CM | POA: Diagnosis present

## 2019-10-01 DIAGNOSIS — F0390 Unspecified dementia without behavioral disturbance: Secondary | ICD-10-CM | POA: Diagnosis present

## 2019-10-01 DIAGNOSIS — K219 Gastro-esophageal reflux disease without esophagitis: Secondary | ICD-10-CM | POA: Diagnosis present

## 2019-10-01 DIAGNOSIS — Z20828 Contact with and (suspected) exposure to other viral communicable diseases: Secondary | ICD-10-CM | POA: Diagnosis present

## 2019-10-01 DIAGNOSIS — E213 Hyperparathyroidism, unspecified: Secondary | ICD-10-CM | POA: Diagnosis present

## 2019-10-01 DIAGNOSIS — R197 Diarrhea, unspecified: Secondary | ICD-10-CM | POA: Diagnosis present

## 2019-10-01 DIAGNOSIS — D3A092 Benign carcinoid tumor of the stomach: Secondary | ICD-10-CM | POA: Diagnosis not present

## 2019-10-01 DIAGNOSIS — D631 Anemia in chronic kidney disease: Secondary | ICD-10-CM | POA: Diagnosis not present

## 2019-10-01 DIAGNOSIS — Z515 Encounter for palliative care: Secondary | ICD-10-CM | POA: Diagnosis not present

## 2019-10-01 DIAGNOSIS — G9340 Encephalopathy, unspecified: Secondary | ICD-10-CM | POA: Diagnosis not present

## 2019-10-01 DIAGNOSIS — I6349 Cerebral infarction due to embolism of other cerebral artery: Secondary | ICD-10-CM | POA: Diagnosis not present

## 2019-10-01 DIAGNOSIS — E8809 Other disorders of plasma-protein metabolism, not elsewhere classified: Secondary | ICD-10-CM | POA: Diagnosis not present

## 2019-10-01 DIAGNOSIS — Z4901 Encounter for fitting and adjustment of extracorporeal dialysis catheter: Secondary | ICD-10-CM | POA: Diagnosis not present

## 2019-10-01 DIAGNOSIS — I700 Atherosclerosis of aorta: Secondary | ICD-10-CM | POA: Diagnosis present

## 2019-10-01 DIAGNOSIS — E875 Hyperkalemia: Secondary | ICD-10-CM | POA: Diagnosis not present

## 2019-10-01 DIAGNOSIS — E785 Hyperlipidemia, unspecified: Secondary | ICD-10-CM | POA: Diagnosis present

## 2019-10-01 DIAGNOSIS — I6340 Cerebral infarction due to embolism of unspecified cerebral artery: Secondary | ICD-10-CM | POA: Diagnosis not present

## 2019-10-01 DIAGNOSIS — N186 End stage renal disease: Secondary | ICD-10-CM | POA: Diagnosis not present

## 2019-10-01 DIAGNOSIS — Z961 Presence of intraocular lens: Secondary | ICD-10-CM | POA: Diagnosis present

## 2019-10-01 DIAGNOSIS — R4182 Altered mental status, unspecified: Secondary | ICD-10-CM | POA: Diagnosis present

## 2019-10-01 DIAGNOSIS — Z23 Encounter for immunization: Secondary | ICD-10-CM | POA: Diagnosis not present

## 2019-10-01 DIAGNOSIS — D509 Iron deficiency anemia, unspecified: Secondary | ICD-10-CM | POA: Diagnosis not present

## 2019-10-01 DIAGNOSIS — E119 Type 2 diabetes mellitus without complications: Secondary | ICD-10-CM | POA: Diagnosis not present

## 2019-10-01 DIAGNOSIS — N179 Acute kidney failure, unspecified: Secondary | ICD-10-CM | POA: Diagnosis not present

## 2019-10-01 DIAGNOSIS — M1990 Unspecified osteoarthritis, unspecified site: Secondary | ICD-10-CM | POA: Diagnosis present

## 2019-10-01 DIAGNOSIS — I10 Essential (primary) hypertension: Secondary | ICD-10-CM | POA: Diagnosis not present

## 2019-10-01 DIAGNOSIS — K862 Cyst of pancreas: Secondary | ICD-10-CM | POA: Diagnosis present

## 2019-10-01 DIAGNOSIS — D638 Anemia in other chronic diseases classified elsewhere: Secondary | ICD-10-CM | POA: Diagnosis not present

## 2019-10-01 DIAGNOSIS — Z96641 Presence of right artificial hip joint: Secondary | ICD-10-CM | POA: Diagnosis present

## 2019-10-01 DIAGNOSIS — Z992 Dependence on renal dialysis: Secondary | ICD-10-CM | POA: Diagnosis not present

## 2019-10-01 DIAGNOSIS — E1151 Type 2 diabetes mellitus with diabetic peripheral angiopathy without gangrene: Secondary | ICD-10-CM | POA: Diagnosis present

## 2019-10-01 LAB — CBC
HCT: 35.7 % — ABNORMAL LOW (ref 36.0–46.0)
Hemoglobin: 10.5 g/dL — ABNORMAL LOW (ref 12.0–15.0)
MCH: 29.8 pg (ref 26.0–34.0)
MCHC: 29.4 g/dL — ABNORMAL LOW (ref 30.0–36.0)
MCV: 101.4 fL — ABNORMAL HIGH (ref 80.0–100.0)
Platelets: 297 10*3/uL (ref 150–400)
RBC: 3.52 MIL/uL — ABNORMAL LOW (ref 3.87–5.11)
RDW: 17.3 % — ABNORMAL HIGH (ref 11.5–15.5)
WBC: 16.3 10*3/uL — ABNORMAL HIGH (ref 4.0–10.5)
nRBC: 0 % (ref 0.0–0.2)

## 2019-10-01 LAB — BASIC METABOLIC PANEL
Anion gap: 13 (ref 5–15)
BUN: 36 mg/dL — ABNORMAL HIGH (ref 8–23)
CO2: 27 mmol/L (ref 22–32)
Calcium: 10.6 mg/dL — ABNORMAL HIGH (ref 8.9–10.3)
Chloride: 95 mmol/L — ABNORMAL LOW (ref 98–111)
Creatinine, Ser: 8.5 mg/dL — ABNORMAL HIGH (ref 0.44–1.00)
GFR calc Af Amer: 5 mL/min — ABNORMAL LOW (ref >60)
GFR calc non Af Amer: 4 mL/min — ABNORMAL LOW (ref >60)
Glucose, Bld: 103 mg/dL — ABNORMAL HIGH (ref 70–99)
Potassium: 5.3 mmol/L — ABNORMAL HIGH (ref 3.5–5.1)
Sodium: 135 mmol/L (ref 135–145)

## 2019-10-01 LAB — GLUCOSE, CAPILLARY
Glucose-Capillary: 102 mg/dL — ABNORMAL HIGH (ref 70–99)
Glucose-Capillary: 93 mg/dL (ref 70–99)
Glucose-Capillary: 152 mg/dL — ABNORMAL HIGH (ref 70–99)
Glucose-Capillary: 171 mg/dL — ABNORMAL HIGH (ref 70–99)

## 2019-10-01 LAB — SURGICAL PCR SCREEN
MRSA, PCR: NEGATIVE
Staphylococcus aureus: NEGATIVE

## 2019-10-01 LAB — SARS CORONAVIRUS 2 (TAT 6-24 HRS): SARS Coronavirus 2: NEGATIVE

## 2019-10-01 LAB — VITAMIN B12: Vitamin B-12: 778 pg/mL (ref 180–914)

## 2019-10-01 LAB — TSH: TSH: 3.102 u[IU]/mL (ref 0.350–4.500)

## 2019-10-01 MED ORDER — DELFLEX-LC/1.5% DEXTROSE 344 MOSM/L IP SOLN: INTRAPERITONEAL | Status: DC

## 2019-10-01 MED ORDER — HEPARIN 1000 UNIT/ML FOR PERITONEAL DIALYSIS
INTRAPERITONEAL | Status: DC | PRN
  Filled 2019-10-01: qty 5000

## 2019-10-01 MED ORDER — HEPARIN 1000 UNIT/ML FOR PERITONEAL DIALYSIS: 500.0000 [IU] | INTRAMUSCULAR | Status: DC | PRN

## 2019-10-01 MED ORDER — GENTAMICIN SULFATE 0.1 % EX CREA
1.0000 "application " | TOPICAL_CREAM | Freq: Every day | CUTANEOUS | Status: DC
  Administered 2019-10-02: 1 via TOPICAL
  Filled 2019-10-01: qty 15

## 2019-10-01 MED ORDER — VANCOMYCIN 50 MG/ML ORAL SOLUTION
125.0000 mg | Freq: Four times a day (QID) | ORAL | Status: DC
  Administered 2019-10-01: 125 mg via ORAL
  Filled 2019-10-01 (×4): qty 2.5

## 2019-10-01 NOTE — Progress Notes (Addendum)
Patient arrived to unit [facility] bed 25 from emergency department. Assisted patient to bed by nursing staff.Patient is confused alert to self only. Oriented patient to nursing unit and call bell system.Call bell and phone within reach.Bed alarm on for safety/.Patient has healing stage 2 to sacrum on admission . Recently discharged from hospital with this pressure sore. No acute distress noted at present time.Will continue to monitor.Unable to complete health history no family at bedside. Will report off to day shift nurse to complete.

## 2019-10-01 NOTE — Consult Note (Signed)
Renal Service Consult Note [facility]  Eileen Perez 10/01/2019 [place] Requesting Physician: Dr Broadus John  Reason for Consult:  ESRD pt on PD w/ AMS HPI: The patient is a 78 y.o. year-old with hx of hx of HTN, HL, gout, DM2 and ESRD started peritoneal dialysis in 2017 presenting to ED yest w/ confusion worsening over time.  No other c/o's.  Patient lives alone and hasn't been doing her PD, family has been going to her house daily to try and help, so has the PD staff reportedly. We are asked to see for ESRD.   Pt is confused about recent events. Remembers that she was started on PD in 2017. She doesn't know how she get here or why she is here, not sure who lives with at home, doesn't know the building or city or year, does know "[place]".    ROS  denies CP  no joint pain   no HA  no blurry vision  no rash  no diarrhea  no nausea/ vomiting   Past Medical History  Past Medical History:  Diagnosis Date  . Anemia   . Arthritis    Osteoarthritis  . Chronic kidney disease    Chronic renal insufficiency, peritoneal dialysis qday sees dr Moshe Cipro  . Diabetes mellitus    Pre  . Diverticulosis   . GERD (gastroesophageal reflux disease)   . Gout   . Hiatal hernia   . History of blood transfusion   . Hyperlipidemia   . Hypertension   . Osteopenia   . Pancreatic cyst 1999  . Thyroid disease    Hyperparathyroid    Past Surgical History  Past Surgical History:  Procedure Laterality Date  . BIOPSY  04/25/2019   Procedure: BIOPSY;  Surgeon: Otis Brace, MD;  [facility]: [facility];  Service: Gastroenterology;;  . BIOPSY  09/02/2019   Procedure: BIOPSY;  Surgeon: Otis Brace, MD;  Location: [place] [facility];  Service: Gastroenterology;;  . CHOLECYSTECTOMY    . COLONOSCOPY  03/21/12   Next one in 2018  . COLONOSCOPY WITH PROPOFOL N/A 04/25/2019   Procedure: COLONOSCOPY WITH PROPOFOL;  Surgeon: Otis Brace, MD;  [facility];   Service: Gastroenterology;  Laterality: N/A;  . ESOPHAGOGASTRODUODENOSCOPY (EGD) WITH PROPOFOL N/A 04/25/2019   Procedure: ESOPHAGOGASTRODUODENOSCOPY (EGD) WITH PROPOFOL;  Surgeon: Otis Brace, MD;  [facility];  Service: Gastroenterology;  Laterality: N/A;  . ESOPHAGOGASTRODUODENOSCOPY (EGD) WITH PROPOFOL N/A 09/02/2019   Procedure: ESOPHAGOGASTRODUODENOSCOPY (EGD) WITH PROPOFOL;  Surgeon: Otis Brace, MD;  [facility]: [facility];  Service: Gastroenterology;  Laterality: N/A;  . EYE SURGERY Left    cataract extraction with IOL  . INSERTION OF DIALYSIS CATHETER N/A 02/15/2017   Procedure: REVISION OF DIALYSIS CATHETER;  Surgeon: Algernon Huxley, MD;  Location: [facility];  Service: Cardiovascular;  Laterality: N/A;  . IR FLUORO GUIDE CV LINE RIGHT  08/29/2019  . IR US GUIDE VASC ACCESS RIGHT  08/29/2019  . JOINT REPLACEMENT  2012   left knee  . left knee replacement     . PANCREATIC CYST EXCISION  1999  . POLYPECTOMY  04/25/2019   Procedure: POLYPECTOMY;  Surgeon: Otis Brace, MD;  [facility];  Service: Gastroenterology;;  . Clide Deutscher  04/25/2019   Procedure: SCLEROTHERAPY;  Surgeon: Otis Brace, MD;  [facility];  Service: Gastroenterology;;  . TOTAL HIP ARTHROPLASTY Right 05/26/2015   Procedure: RIGHT TOTAL HIP ARTHROPLASTY ANTERIOR APPROACH;  Surgeon: Gaynelle Arabian, MD;  [facility];  Service: Orthopedics;  Laterality: Right;  .  TOTAL KNEE ARTHROPLASTY Right 06/09/2013   Procedure: RIGHT TOTAL KNEE ARTHROPLASTY;  Surgeon: Gearlean Alf, MD;  [facility];  Service: Orthopedics;  Laterality: Right;   Family History  Family History  Problem Relation Age of Onset  . Cancer Father        Prostate and Throat  . Hypertension Mother   . Cancer Mother        Colon with METS  . Diabetes Mother    Social History  reports that she has never smoked. She has never used smokeless tobacco. She reports that she does not drink alcohol or use  drugs. Allergies  Allergies  Allergen Reactions  . Ace Inhibitors Other (See Comments)    unknown  . Nsaids Other (See Comments)    unknown   Home medications Prior to Admission medications   Medication Sig Start Date End Date Taking? Authorizing Provider  allopurinol (ZYLOPRIM) 300 MG tablet Take 1 tablet daily for gout 09/16/19  Yes Unk Pinto, MD  alum & mag hydroxide-simeth (MAALOX/MYLANTA) 200-200-20 MG/5ML suspension Take 15 mLs by mouth 3 (three) times daily before meals. 09/14/19 10/14/19 Yes Nicole Kindred A, DO  aspirin 81 MG tablet Take 81 mg by mouth at bedtime.    Yes [provider]  b complex-vitamin c-folic acid (NEPHRO-VITE) 0.8 MG TABS tablet Take 1 tablet by mouth daily.    Yes [provider]  Blood Glucose Monitoring Suppl DEVI Check blood glucose before meals, at bedtime and as needed. 09/18/19  Yes McClanahan, Danton Sewer, NP  gentamicin cream (GARAMYCIN) 0.1 % Apply 1 application topically daily. Patient taking differently: Apply 1 application topically every evening.  09/14/19  Yes Nicole Kindred A, DO  glucose blood test strip Use as instructed 09/18/19  Yes McClanahan, Kyra, NP  Lancets (ACCU-CHEK SOFT TOUCH) lancets Use as instructed 09/18/19  Yes McClanahan, Kyra, NP  lidocaine (XYLOCAINE) 2 % solution Use as directed 15 mLs in the mouth or throat 3 (three) times daily before meals. 09/14/19 10/14/19 Yes Ezekiel Slocumb, DO  Nutritional Supplements (FEEDING SUPPLEMENT, NEPRO CARB STEADY,) LIQD Take 237 mLs by mouth 3 (three) times daily between meals. 09/18/19 10/18/19 Yes McClanahan, Kyra, NP  ondansetron (ZOFRAN-ODT) 4 MG disintegrating tablet Take 1 tablet every 8 hours as needed for nausea and vomiting. 09/16/19  Yes Unk Pinto, MD  pantoprazole (PROTONIX) 40 MG tablet Takes 1 tablet twice a day for reflux. Patient taking differently: Take 40 mg by mouth 2 (two) times daily.  09/18/19  Yes McClanahan, Danton Sewer, NP  Potassium Chloride  ER 20 MEQ TBCR Takes 1 tablet daily. Patient taking differently: Take 20 mEq by mouth daily. Takes 1 tablet daily. 09/16/19  Yes Unk Pinto, MD  rosuvastatin (CRESTOR) 5 MG tablet Take 1 tablet after supper for cholesterol. Patient taking differently: Take 5 mg by mouth daily after supper. Take 1 tablet after supper for cholesterol. 09/16/19  Yes Unk Pinto, MD  Vancomycin HCl 50 MG/ML SOLR Take 2.5 mLs by mouth every 6 (six) hours. For 10 days   Yes [provider]  ZINC GLUCONATE PO Take 40 mg by mouth daily.    Yes [provider]   Liver Function Tests Recent Labs  Lab 09/30/19 1442  AST 16  ALT 14  ALKPHOS 141*  BILITOT 0.3  PROT 5.4*  ALBUMIN 1.9*   No results for input(s): LIPASE, AMYLASE in the last 168 hours. CBC Recent Labs  Lab 09/30/19 1442 10/01/19 0336  WBC 13.6* 16.3*  NEUTROABS 11.6*  --  HGB 11.0* 10.5*  HCT 37.5 35.7*  MCV 101.9* 101.4*  PLT 293 952   Basic Metabolic Panel Recent Labs  Lab 09/30/19 1442 10/01/19 0336  NA 135 135  K 4.5 5.3*  CL 95* 95*  CO2 26 27  GLUCOSE 152* 103*  BUN 32* 36*  CREATININE 7.30* 8.50*  CALCIUM 10.6* 10.6*  PHOS 2.2*  --    Iron/TIBC/Ferritin/ %Sat    Component Value Date/Time   IRON 90 03/25/2019 1141   TIBC 215 (L) 03/25/2019 1141   FERRITIN 1,467 (H) 03/25/2019 1141   IRONPCTSAT 42 03/25/2019 1141    Vitals:   10/01/19 0900 10/01/19 1000 10/01/19 1100 10/01/19 1218  BP:    (!) 100/56  Pulse:    88  Resp: 20 19 15 20   Temp:    97.7 F (36.5 C)  TempSrc:    Oral  SpO2:    100%  Weight:        Exam Gen alert, disoriented, pleasant and conversant No rash, cyanosis or gangrene Sclera anicteric, throat clear  No jvd or bruits Chest clear bilat to bses RRR no MRG Abd soft ntnd no mass or ascites LLQ PD cath +bs GU defer MS no joint effusions or deformity Ext no LE edema, no wounds or ulcers Neuro is alert, Ox 1 only, "[place]", doesn't know the building,  why she is here or who she lives with at home, doesn't know the year    Home meds:  - allopurinol 300/ pantoprazole 40 bid  - rosuvastatin 5 qd/ Kdur 20 qd  - prn's/ vitamins/ supplements     Outpt PD: 5 exchanges, 2500 dwell, 2 hr each,  no day bag, edw 48.5kg  - started in 2017, last clearance here was 2.05 on 08/2019.       Assessment/ Plan: 1. AMS/ confusion - reportedly worsening over this year and acutely the past 1-2 weeks.  Not doing her PD properly.  May have some uremic component.  Will plan PD tonight. Could not reach family.  Will discuss w/ them next course tomorrow. Options are try HD and see if confusion improves vs transition to hospice. Will consult palliative care for [place] 2. ESRD - on PD x 3 yrs, not doing well , not able to do by herself at home 3. Volume - no vol excess on exam, under dry wt 4. Anemia ckd - Hb 10.5, no need esa for now      Kelly Splinter  MD 10/01/2019, 3:10 PM

## 2019-10-01 NOTE — Evaluation (Signed)
Occupational Therapy Evaluation Patient Details Name: Eileen Perez MRN: [id number] DOB: 1941-04-18 Today's Date: 10/01/2019    History of Present Illness 78 y.o. 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   Clinical Impression   Pt was admitted for the above.  Per chart, pt had 24/7 family with her and aide.  It appears family was traveling from a distance to be with her. Pt would benefit from SNF for rehab unless family decides to continue to provide 24/7.  Pt needs min to mod A for LB adls and cues for safety. Goals in acute are for min guard level.    Follow Up Recommendations  SNF(unless family continues to provide 24/7; if so, HHOT)    Equipment Recommendations  None recommended by OT    Recommendations for Other Services       Precautions / Restrictions Precautions Precautions: Fall Restrictions Weight Bearing Restrictions: No      Mobility Bed Mobility         Supine to sit: Min guard     General bed mobility comments: extra time  Transfers   Equipment used: Rolling walker (2 wheeled)   Sit to Stand: Min guard;Min assist         General transfer comment: min A from bed and min guard from chair with extra time    Balance                                           ADL either performed or assessed with clinical judgement   ADL Overall ADL's : Needs assistance/impaired Eating/Feeding: Independent   Grooming: Min guard;Standing;Oral care   Upper Body Bathing: Supervision/ safety   Lower Body Bathing: Minimal assistance;Moderate assistance   Upper Body Dressing : Minimal assistance   Lower Body Dressing: Minimal assistance;Moderate assistance   Toilet Transfer:  Minimal assistance;Stand-pivot;RW(chair)   Toileting- Clothing Manipulation and Hygiene: Minimal assistance         General ADL Comments: pt stood at chair for teeth and got up to chair. Pt is able to cross legs for adls. Cues for safety     Vision         Perception     Praxis      Pertinent Vitals/Pain Pain Assessment: No/denies pain     Hand Dominance Right   Extremity/Trunk Assessment Upper Extremity Assessment Upper Extremity Assessment: Generalized weakness           Communication Communication Communication: No difficulties   Cognition Arousal/Alertness: Awake/alert Behavior During Therapy: Flat affect                     Orientation Level: Disoriented to;Place;Time;Situation   Memory: Decreased short-term memory Following Commands: Follows one step commands consistently Safety/Judgement: Decreased awareness of safety;Decreased awareness of deficits     General Comments: difficulty answering questions.  Could not answer type of place she is at. Was able to say that she did have an aide. Cues for safety:  did not remember to use walker in front of her at sink to return to chair   General Comments       Exercises     Shoulder Instructions      Home Living  Family/patient expects to be discharged to:: Unsure Living Arrangements: Alone Available Help at Discharge: Family;Available 24 hours/day Type of Home: House Home Access: Stairs to enter [employer] of Steps: 1   Home Layout: One level     Bathroom Shower/Tub: Tub/shower unit;Curtain   Biochemist, clinical: Standard     Home Equipment: Environmental consultant - 4 wheels;Bedside commode   Additional Comments: Multiple children to rotate providing care as needed      Prior Functioning/Environment    Gait / Transfers Assistance Needed: Ambulation with rollator. On peritoneal dialysis at home ADL's / Homemaking Assistance Needed: grandaughter here from [place] to help for a week, then  another family member will come to help   Comments: was getting HHPT/OT and had an aide, grandaughter not sure how often aide comes:  pt unable to provide info. Says "I've been out of it". Information from PT eval        OT Problem List: Decreased strength;Decreased activity tolerance;Impaired balance (sitting and/or standing);Decreased cognition;Decreased safety awareness;Decreased knowledge of use of DME or AE;Impaired UE functional use      OT Treatment/Interventions: Self-care/ADL training;DME and/or AE instruction;Patient/family education;Balance training;Cognitive remediation/compensation;Therapeutic activities;Therapeutic exercise    OT Goals(Current goals can be found in the care plan section) Acute Rehab OT Goals Patient Stated Goal: non stated OT Goal Formulation: Patient unable to participate in goal setting Time For Goal Achievement: 10/15/19 Potential to Achieve Goals: Good ADL Goals Pt Will Perform Grooming: with supervision;standing Pt Will Transfer to Toilet: with min guard assist;ambulating;bedside commode Pt Will Perform Toileting - Clothing Manipulation and hygiene: with min guard assist;sit to/from stand Additional ADL Goal #1: pt will not need more than 2 safety cues per session during adls/bathroom mobility  OT Frequency: Min 2X/week   Barriers to D/C:            Co-evaluation              AM-PAC OT "6 Clicks" Daily Activity     Outcome Measure Help from another person eating meals?: None Help from another person taking care of personal grooming?: A Little Help from another person toileting, which includes using toliet, bedpan, or urinal?: A Little Help from another person bathing (including washing, rinsing, drying)?: A Lot Help from another person to put on and taking off regular upper body clothing?: A Little Help from another person to put on and taking off regular lower body clothing?: A Lot 6 Click Score: 17   End of Session Nurse  Communication: (NT: pt up with alarm on; dumped milk)  Activity Tolerance: Patient tolerated treatment well Patient left: in chair;with call bell/phone within reach;with chair alarm set  OT Visit Diagnosis: Muscle weakness (generalized) (M62.81);Cognitive communication deficit (R41.841)                Time: 4709-6283 OT Time Calculation (min): 27 min Charges:  OT General Charges $OT Visit: 1 Visit OT Evaluation $OT Eval Moderate Complexity: 1 Mod OT Treatments $Self Care/Home Management : 8-22 mins  Lesle Chris, OTR/L Acute Rehabilitation Services [phone] [facility] pager [phone] office 10/01/2019  [place] 10/01/2019, 8:41 AM

## 2019-10-01 NOTE — Progress Notes (Signed)
Unable to collect urine for  UA/urine c&s, patient reports does not make urine output and has not for sometime now.

## 2019-10-01 NOTE — Progress Notes (Addendum)
PROGRESS NOTE    Safari Cinque  IPJ:[id number] DOB: 1941/03/03 DOA: 09/30/2019 PCP: Unk Pinto, MD  Brief Narrative Uilani Sanville is a 78 y.o. 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 difficile and presently on oral vancomycin  -Prior to that was admitted for partial small bowel obstruction also had a EGD that showed Schatzki's ring - was brought to the ER after patient's granddaughter found that patient was increasingly confused since 11/10 morning.  Has not had any nausea vomiting diarrhea abdominal pain chest pain fever chills or any falls.  Was not following commands according to patient's home health aide and was referred to the ER.    ED Course: In the ER patient is oriented to her name CT head is unremarkable ammonia levels were negative.  Per patient's granddaughter patient was not able to do her peritoneal dialysis as instructed.  Lab work show COVID-19 was negative.  EKG shows normal sinus rhythm.  Chest x-ray unremarkable.  Potassium 4.5 creatinine 7.3 phosphorus 2.2 magnesium 3.2 hemoglobin 11 platelets 193 WBC 13.6.  Chest x-ray unremarkable.  Patient admitted for  encephalopathy.   Assessment & Plan:   1. Encephalopathy  -Patient is nonfocal on exam, she is awake and alert however has significant delay in answering questions and prominent short-term memory deficits -Could be secondary to uremia, inadequate PD recently -CT head is unremarkable -Chest x-ray is benign as well, -Mild leukocytosis noted however patient is afebrile, no localizing signs of infection, check urinalysis -Called and discussed with daughter, family has been taking turns to help her, daughter reports multiple recent hospitalizations after which cognitive function has declined, however much more drastically over this past week -Monitor  post HD, will consider MRI brain if develops localizing signs or persistence -Also add on TSH and B12, ammonia  2. ESRD on peritoneal dialysis, mild hyperkalemia -Nephrology consulted, would not be a candidate for peritoneal dialysis any further given cognitive deficits, nephrology recommended palliative care discussion as well  3. 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 Recent diagnosis of C. difficile colitis  -Does not report ongoing diarrhea, also has carcinoid which can also contribute to some chronic diarrhea  -Per ID note recommendation was to continue oral vancomycin for 10 days from 10/24 which was completed -No evidence of recurrence yet  5. History of carcinoid tumor of the stomach -Concern for possible progression and questionable lymph node metastasis on last imaging, CT abdomen pelvis 10/8  6. Hyperlipidemia on statins.   DVT prophylaxis: Heparin. Code Status: Full code. Family Communication:  Family at bedside called and discussed with daughter Disposition Plan: Home. Consults called:  Nephrology   Procedures:   Antimicrobials:    Subjective: -Significant delay in answering questions  Objective: Vitals:   10/01/19 0900 10/01/19 1000 10/01/19 1100 10/01/19 1218  BP:    (!) 100/56  Pulse:    88  Resp: 20 19 15 20   Temp:    97.7 F (36.5 C)  TempSrc:    Oral  SpO2:    100%  Weight:        Intake/Output Summary (Last 24 hours) at 10/01/2019 1223 Last data filed at 10/01/2019 0850 Gross per 24 hour  Intake 120 ml  Output -  Net 120 ml   Filed Weights   10/01/19 0511  Weight: 44.8 kg    Examination:  General exam: Pleasant thinly  built elderly female awake alert oriented to self only, significant memory deficits noted long delay in answering orientation questions Respiratory system: Decreased breath sounds bases otherwise clear Cardiovascular system: S1 & S2 heard, RRR. Gastrointestinal system: Abdomen is nondistended, soft and  nontender.Normal bowel sounds heard. Central nervous system: Alert and oriented. No focal neurological deficits. Extremities: No edema Skin: No rashes, lesions or ulcers Psychiatry: Poor insight and judgment    Data Reviewed:   CBC: Recent Labs  Lab 09/30/19 1442 10/01/19 0336  WBC 13.6* 16.3*  NEUTROABS 11.6*  --   HGB 11.0* 10.5*  HCT 37.5 35.7*  MCV 101.9* 101.4*  PLT 293 846   Basic Metabolic Panel: Recent Labs  Lab 09/30/19 1442 10/01/19 0336  NA 135 135  K 4.5 5.3*  CL 95* 95*  CO2 26 27  GLUCOSE 152* 103*  BUN 32* 36*  CREATININE 7.30* 8.50*  CALCIUM 10.6* 10.6*  MG 3.2*  --   PHOS 2.2*  --    GFR: Estimated Creatinine Clearance: 3.8 mL/min (A) (by C-G formula based on SCr of 8.5 mg/dL (H)). Liver Function Tests: Recent Labs  Lab 09/30/19 1442  AST 16  ALT 14  ALKPHOS 141*  BILITOT 0.3  PROT 5.4*  ALBUMIN 1.9*   No results for input(s): LIPASE, AMYLASE in the last 168 hours. Recent Labs  Lab 09/30/19 1529  AMMONIA 16   Coagulation Profile: Recent Labs  Lab 09/30/19 1442  INR 0.9   Cardiac Enzymes: No results for input(s): CKTOTAL, CKMB, CKMBINDEX, TROPONINI in the last 168 hours. BNP (last 3 results) No results for input(s): PROBNP in the last 8760 hours. HbA1C: No results for input(s): HGBA1C in the last 72 hours. CBG: Recent Labs  Lab 09/30/19 1408 10/01/19 0135 10/01/19 0623  GLUCAP 139* 102* 93   Lipid Profile: No results for input(s): CHOL, HDL, LDLCALC, TRIG, CHOLHDL, LDLDIRECT in the last 72 hours. Thyroid Function Tests: Recent Labs    10/01/19 0902  TSH 3.102   Anemia Panel: Recent Labs    10/01/19 0902  VITAMINB12 778   Urine analysis:    Component Value Date/Time   COLORURINE YELLOW 03/25/2019 1548   APPEARANCEUR TURBID (A) 03/25/2019 1548   LABSPEC 1.007 03/25/2019 1548   PHURINE 7.0 03/25/2019 1548   GLUCOSEU NEGATIVE 03/25/2019 1548   HGBUR 2+ (A) 03/25/2019 1548   BILIRUBINUR NEGATIVE 12/08/2016  1115   KETONESUR NEGATIVE 03/25/2019 1548   PROTEINUR 2+ (A) 03/25/2019 1548   UROBILINOGEN 0.2 05/19/2015 0942   NITRITE NEGATIVE 03/25/2019 1548   LEUKOCYTESUR 3+ (A) 03/25/2019 1548   Sepsis Labs: @LABRCNTIP (procalcitonin:4,lacticidven:4)  ) Recent Results (from the past 240 hour(s))  SARS CORONAVIRUS 2 (TAT 6-24 HRS) Nasopharyngeal Nasopharyngeal Swab     Status: None   Collection Time: 09/30/19  7:10 PM   Specimen: Nasopharyngeal Swab  Result Value Ref Range Status   SARS Coronavirus 2 NEGATIVE NEGATIVE Final    Comment: (NOTE) SARS-CoV-2 target nucleic acids are NOT DETECTED. The SARS-CoV-2 RNA is generally detectable in upper and lower respiratory specimens during the acute phase of infection. Negative results do not preclude SARS-CoV-2 infection, do not rule out co-infections with other pathogens, and should not be used as the sole basis for treatment or other patient management decisions. Negative results must be combined with clinical observations, patient history, and epidemiological information. The expected result is Negative. Fact Sheet for Patients: SugarRoll.be Fact Sheet for Healthcare Providers: https://www.woods-mathews.com/ This test is not yet approved or cleared by the [place]  FDA and  has been authorized for detection and/or diagnosis of SARS-CoV-2 by FDA under an Emergency Use Authorization (EUA). This EUA will remain  in effect (meaning this test can be used) for the duration of the COVID-19 declaration under Section 56 4(b)(1) of the Act, 21 U.S.C. section 360bbb-3(b)(1), unless the authorization is terminated or revoked sooner. Performed at [facility], [place] [address] [place], [place] [address]   Surgical pcr screen     Status: None   Collection Time: 10/01/19  1:44 AM   Specimen: Nasal Mucosa; Nasal Swab  Result Value Ref Range Status   MRSA, PCR NEGATIVE NEGATIVE Final   Staphylococcus  aureus NEGATIVE NEGATIVE Final    Comment: (NOTE) The Xpert SA Assay (FDA approved for NASAL specimens in patients 33 years of age and older), is one component of a comprehensive surveillance program. It is not intended to diagnose infection nor to guide or monitor treatment. Performed at [facility], [place] [address] [place], [place] [address]          Radiology Studies: Ct Head Wo Contrast  Result Date: 09/30/2019 CLINICAL DATA:  Altered level of consciousness. EXAM: CT HEAD WITHOUT CONTRAST TECHNIQUE: Contiguous axial images were obtained from the base of the skull through the vertex without intravenous contrast. COMPARISON:  September 10, 2015. FINDINGS: Brain: Mild diffuse cortical atrophy is noted. Mild chronic ischemic white matter disease is noted. No mass effect or midline shift is noted. Ventricular size is within normal limits. There is no evidence of mass lesion, hemorrhage or acute infarction. Vascular: No hyperdense vessel or unexpected calcification. Skull: Normal. Negative for fracture or focal lesion. Sinuses/Orbits: Stable left maxillary mucous retention cyst is noted. Other: None. IMPRESSION: Mild diffuse cortical atrophy. Mild chronic ischemic white matter disease. No acute intracranial abnormality seen. Electronically Signed   By: Marijo Conception M.D.   On: 09/30/2019 15:12   Dg Chest Port 1 View  Result Date: 09/30/2019 CLINICAL DATA:  Altered EXAM: PORTABLE CHEST 1 VIEW COMPARISON:  Chest radiograph 09/02/2019 FINDINGS: Heart size within normal limits.  Aortic atherosclerosis. There is no focal consolidation within the lungs. No evidence of pleural effusion or pneumothorax. No acute bony abnormality. Overlying monitoring leads. IMPRESSION: No evidence of acute cardiopulmonary disease. Aortic atherosclerosis. Electronically Signed   By: Kellie Simmering DO   On: 09/30/2019 15:38        Scheduled Meds: . allopurinol  300 mg Oral Daily  . aspirin EC  81 mg Oral  QHS  . feeding supplement (NEPRO CARB STEADY)  237 mL Oral TID BM  . heparin  5,000 Units Subcutaneous Q8H  . insulin aspart  0-9 Units Subcutaneous TID WC  . omega-3 acid ethyl esters  1 g Oral Daily  . pantoprazole  40 mg Oral BID  . rosuvastatin  5 mg Oral q1800  . sevelamer carbonate  800 mg Oral TID WC  . vancomycin  125 mg Oral QID   Continuous Infusions:   LOS: 0 days    Time spent: [id number]    Domenic Polite, MD [facility] Page via [email or web address], password [email or web address] After 7PM please contact night-coverage  10/01/2019, 12:23 PM

## 2019-10-02 ENCOUNTER — Other Ambulatory Visit: Payer: Self-pay | Admitting: Internal Medicine

## 2019-10-02 DIAGNOSIS — D3A092 Benign carcinoid tumor of the stomach: Secondary | ICD-10-CM

## 2019-10-02 DIAGNOSIS — E8809 Other disorders of plasma-protein metabolism, not elsewhere classified: Secondary | ICD-10-CM

## 2019-10-02 DIAGNOSIS — Z7189 Other specified counseling: Secondary | ICD-10-CM

## 2019-10-02 DIAGNOSIS — D638 Anemia in other chronic diseases classified elsewhere: Secondary | ICD-10-CM

## 2019-10-02 LAB — URINALYSIS, ROUTINE W REFLEX MICROSCOPIC
Bilirubin Urine: NEGATIVE
Glucose, UA: 50 mg/dL — AB
Ketones, ur: NEGATIVE mg/dL
Nitrite: NEGATIVE
Protein, ur: >=300 mg/dL — AB
Specific Gravity, Urine: 1.012 (ref 1.005–1.030)
pH: 6 (ref 5.0–8.0)

## 2019-10-02 LAB — CBC
HCT: 35.7 % — ABNORMAL LOW (ref 36.0–46.0)
Hemoglobin: 10.4 g/dL — ABNORMAL LOW (ref 12.0–15.0)
MCH: 29.8 pg (ref 26.0–34.0)
MCHC: 29.1 g/dL — ABNORMAL LOW (ref 30.0–36.0)
MCV: 102.3 fL — ABNORMAL HIGH (ref 80.0–100.0)
Platelets: 255 10*3/uL (ref 150–400)
RBC: 3.49 MIL/uL — ABNORMAL LOW (ref 3.87–5.11)
RDW: 17.4 % — ABNORMAL HIGH (ref 11.5–15.5)
WBC: 11.4 10*3/uL — ABNORMAL HIGH (ref 4.0–10.5)
nRBC: 0 % (ref 0.0–0.2)

## 2019-10-02 LAB — COMPREHENSIVE METABOLIC PANEL
ALT: 15 U/L (ref 0–44)
AST: 28 U/L (ref 15–41)
Albumin: 1.7 g/dL — ABNORMAL LOW (ref 3.5–5.0)
Alkaline Phosphatase: 134 U/L — ABNORMAL HIGH (ref 38–126)
Anion gap: 15 (ref 5–15)
BUN: 40 mg/dL — ABNORMAL HIGH (ref 8–23)
CO2: 25 mmol/L (ref 22–32)
Calcium: 10.4 mg/dL — ABNORMAL HIGH (ref 8.9–10.3)
Chloride: 94 mmol/L — ABNORMAL LOW (ref 98–111)
Creatinine, Ser: 8.39 mg/dL — ABNORMAL HIGH (ref 0.44–1.00)
GFR calc Af Amer: 5 mL/min — ABNORMAL LOW (ref >60)
GFR calc non Af Amer: 4 mL/min — ABNORMAL LOW (ref >60)
Glucose, Bld: 171 mg/dL — ABNORMAL HIGH (ref 70–99)
Potassium: 4.7 mmol/L (ref 3.5–5.1)
Sodium: 134 mmol/L — ABNORMAL LOW (ref 135–145)
Total Bilirubin: 0.7 mg/dL (ref 0.3–1.2)
Total Protein: 5 g/dL — ABNORMAL LOW (ref 6.5–8.1)

## 2019-10-02 LAB — AMMONIA: Ammonia: 24 umol/L (ref 9–35)

## 2019-10-02 LAB — GLUCOSE, CAPILLARY
Glucose-Capillary: 133 mg/dL — ABNORMAL HIGH (ref 70–99)
Glucose-Capillary: 172 mg/dL — ABNORMAL HIGH (ref 70–99)
Glucose-Capillary: 112 mg/dL — ABNORMAL HIGH (ref 70–99)
Glucose-Capillary: 114 mg/dL — ABNORMAL HIGH (ref 70–99)
Glucose-Capillary: 159 mg/dL — ABNORMAL HIGH (ref 70–99)

## 2019-10-02 MED ORDER — CHLORHEXIDINE GLUCONATE CLOTH 2 % EX PADS
6.0000 | MEDICATED_PAD | Freq: Every day | CUTANEOUS | Status: DC
  Administered 2019-10-02 – 2019-10-10 (×9): 6 via TOPICAL

## 2019-10-02 MED ORDER — HEPARIN 1000 UNIT/ML FOR PERITONEAL DIALYSIS: 500.0000 [IU] | INTRAMUSCULAR | Status: DC | PRN

## 2019-10-02 MED ORDER — GENTAMICIN SULFATE 0.1 % EX CREA: 1.0000 "application " | TOPICAL_CREAM | Freq: Every day | CUTANEOUS | Status: DC

## 2019-10-02 MED ORDER — HEPARIN SODIUM (PORCINE) 5000 UNIT/ML IJ SOLN
5000.0000 [IU] | Freq: Three times a day (TID) | INTRAMUSCULAR | Status: AC
  Administered 2019-10-02 (×2): 5000 [IU] via SUBCUTANEOUS
  Filled 2019-10-02 (×2): qty 1

## 2019-10-02 MED ORDER — CEFAZOLIN SODIUM-DEXTROSE 2-4 GM/100ML-% IV SOLN
2.0000 g | Freq: Once | INTRAVENOUS | Status: AC
  Administered 2019-10-03: 2 g via INTRAVENOUS
  Filled 2019-10-02 (×3): qty 100

## 2019-10-02 MED ORDER — DELFLEX-LC/1.5% DEXTROSE 344 MOSM/L IP SOLN
INTRAPERITONEAL | Status: DC
  Administered 2019-10-02: 5000 mL via INTRAPERITONEAL

## 2019-10-02 MED ORDER — CEFAZOLIN SODIUM-DEXTROSE 2-4 GM/100ML-% IV SOLN
2.0000 g | Freq: Once | INTRAVENOUS | Status: DC
  Filled 2019-10-02: qty 100

## 2019-10-02 NOTE — Progress Notes (Signed)
Physical Therapy Treatment Patient Details Name: Eileen Perez MRN: [id number] DOB: November 22, 1940 Today's Date: 10/02/2019    History of Present Illness 78 y.o. 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    PT Comments    Patient received in bed, asleep but easily woken and willing to participate in PT session. Able to complete bed mobility with min guard, functional transfers with min guard and RW with cues for safety, and gait approximately 156f with min guard/RW and cues for safe use of device. VSS on room air. She was left up in the chair with all needs met, chair alarm active.     Follow Up Recommendations  SNF     Equipment Recommendations  None recommended by PT    Recommendations for Other Services       Precautions / Restrictions Precautions Precautions: Fall Restrictions Weight Bearing Restrictions: No    Mobility  Bed Mobility Overal bed mobility: Needs Assistance Bed Mobility: Supine to Sit     Supine to sit: Min guard     General bed mobility comments: extra time  Transfers Overall transfer level: Needs assistance Equipment used: Rolling walker (2 wheeled) Transfers: Sit to/from SOmnicareSit to Stand: Min guard Stand pivot transfers: Min guard       General transfer comment: min guard with extra time, able to stand for pericare with min guard and then pivoted to chair with min guard and VC for safety/hand placement  Ambulation/Gait Ambulation/Gait assistance: Supervision Gait Distance (Feet): 175 Feet Assistive device: Rolling walker (2 wheeled) Gait Pattern/deviations: Step-through pattern;Decreased stride length Gait velocity: decreased   General Gait Details:  improved gait speed today, cues for safety with RW on turns   Stairs             Wheelchair Mobility    Modified Rankin (Stroke Patients Only)       Balance Overall balance assessment: Needs assistance Sitting-balance support: Bilateral upper extremity supported;Feet supported Sitting balance-Leahy Scale: Good     Standing balance support: Bilateral upper extremity supported Standing balance-Leahy Scale: Fair Standing balance comment: UE support for balance                            Cognition Arousal/Alertness: Awake/alert Behavior During Therapy: Flat affect Overall Cognitive Status: Impaired/Different from baseline Area of Impairment: Orientation;Memory;Safety/judgement;Following commands;Problem solving;Attention;Awareness                 Orientation Level: Disoriented to;Place;Time;Situation Current Attention Level: Sustained Memory: Decreased short-term memory Following Commands: Follows one step commands consistently Safety/Judgement: Decreased awareness of safety;Decreased awareness of deficits Awareness: Intellectual Problem Solving: Slow processing;Requires verbal cues General Comments: cognition improving today, remains very flat but safety improved      Exercises      General Comments        Pertinent Vitals/Pain Pain Assessment: No/denies pain Faces Pain Scale: No hurt Pain Intervention(s): Monitored during session;Limited activity within patient's tolerance    Home Living                      Prior Function            PT Goals (current goals can now be found in the care plan section) Acute Rehab PT Goals Patient Stated  Goal: non stated PT Goal Formulation: With patient/family Time For Goal Achievement: 10/14/19 Potential to Achieve Goals: Good Progress towards PT goals: Progressing toward goals    Frequency    Min 3X/week      PT Plan Current plan remains appropriate    Co-evaluation               AM-PAC PT "6 Clicks" Mobility   Outcome Measure  Help needed turning from your back to your side while in a flat bed without using bedrails?: None Help needed moving from lying on your back to sitting on the side of a flat bed without using bedrails?: None Help needed moving to and from a bed to a chair (including a wheelchair)?: A Little Help needed standing up from a chair using your arms (e.g., wheelchair or bedside chair)?: A Little Help needed to walk in hospital room?: A Little Help needed climbing 3-5 steps with a railing? : A Little 6 Click Score: 20    End of Session   Activity Tolerance: Patient tolerated treatment well Patient left: in chair;with call bell/phone within reach;with chair alarm set   PT Visit Diagnosis: Muscle weakness (generalized) (M62.81);Other symptoms and signs involving the nervous system (R29.898)     Time: 5015-8682 PT Time Calculation (min) (ACUTE ONLY): 17 min  Charges:  $Gait Training: 8-22 mins                     Windell Norfolk, DPT, CBIS  Supplemental Physical Therapist [place]    Pager [phone] Acute Rehab Office [phone]

## 2019-10-02 NOTE — Progress Notes (Addendum)
PROGRESS NOTE    Uzma Hellmer   MLY:[id number]  DOB: 1941/10/04  DOA: 09/30/2019 PCP: Unk Pinto, MD   Brief Narrative:  Sereniti Wan the patient is a 78 y.o. year-old who lives alone with ESRD on peritoneal dialysis, gastric carcinoid, gout, DM2   presenting to ED with confusion. She lives alone. Her grand daugther noted confusion over the weekend and her [facility] went to check on her on Tuesday and noted severe confusion. Her daughter [name] does not live with her tells me that a dialysis RN or a family member were helping with her dialysis. She was also eating poorly this past week.   In ED> head CT unrevealing, Ammonia level negative   Subjective: No complaints- pleasantly confused. She is not aware of where she is, her children's names, or month/year.    Assessment & Plan:   Principal Problem:   Acute metabolic encephalopathy - the patient was sharp enough to do her own dialysis but has been confused about 1 wk now -  thought to be due to inability to do dialysis properly and subsequent uremia - she remains confused today - WBC count slightly elevated-  no UA done- will ask RN to do I and O cath and will get a UA and culture. 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 If related to recent C diff infection  DM2 - last A1c 6.5 in 4/20- not on medications at home so I suspect it is diet controlled   HLD Crestor   Carcinoid tumor of stomach   Time spent in minutes: 35  DVT prophylaxis: Heparin Code Status: Full code Family Communication:  Daughter [name] Disposition Plan: to be determined Consultants:   nephrology Procedures:   dialysis Antimicrobials:  Anti-infectives (From admission, onward)   Start     Dose/Rate Route Frequency Ordered Stop   10/01/19 0800  vancomycin (VANCOCIN) 50 mg/mL oral solution 125 mg  Status:  Discontinued     125 mg Oral 4 times daily 10/01/19 0631 10/01/19 1313       Objective: Vitals:   10/02/19 0605 10/02/19 0656 10/02/19 0709 10/02/19 0745  BP: 103/60  (!) 111/59 (!) 106/54  Pulse: 78  82 83  Resp: 18  16 15   Temp: 97.6 F (36.4 C)  97.8 F (36.6 C) 97.8 F (36.6 C)  TempSrc: Oral  Oral Oral  SpO2: 99%  100% 98%  Weight:  45.4 kg 45.7 kg     Intake/Output Summary (Last 24 hours) at 10/02/2019 1032 Last data filed at 10/01/2019 2030 Gross per 24 hour  Intake 717 ml  Output 0 ml  Net 717 ml   Filed Weights   10/01/19 0511 10/02/19 0656 10/02/19 0709  Weight: 44.8 kg 45.4 kg 45.7 kg    Examination: General exam: Appears comfortable  HEENT: PERRLA, oral mucosa moist, no sclera icterus or thrush  Respiratory system: Clear to auscultation. Respiratory effort normal. Cardiovascular system: S1 & S2 heard, RRR.   Gastrointestinal system: Abdomen soft, non-tender, nondistended. Normal bowel sounds- dialysis catheter present. Central nervous system: Alert and oriented only to person- No focal neurological deficits. Extremities: No cyanosis, clubbing or edema Skin: No rashes or ulcers Psychiatry:  Mood & affect appropriate.     Data Reviewed: I have personally reviewed following labs and imaging studies  CBC: Recent Labs  Lab 09/30/19 1442 10/01/19 0336 10/02/19 0530  WBC 13.6* 16.3* 11.4*  NEUTROABS 11.6*  --   --   HGB 11.0* 10.5* 10.4*  HCT 37.5 35.7* 35.7*  MCV 101.9* 101.4* 102.3*  PLT 293 297 283    Basic Metabolic Panel: Recent Labs  Lab 09/30/19 1442 10/01/19 0336 10/02/19 0530  NA 135 135 134*  K 4.5 5.3* 4.7  CL 95* 95* 94*  CO2 26 27 25   GLUCOSE 152* 103* 171*  BUN 32* 36* 40*  CREATININE 7.30* 8.50* 8.39*  CALCIUM 10.6* 10.6* 10.4*  MG 3.2*  --   --   PHOS 2.2*  --   --    GFR: Estimated Creatinine Clearance: 3.9 mL/min (A) (by C-G formula based on SCr of 8.39 mg/dL (H)). Liver Function Tests: Recent Labs  Lab 09/30/19 1442 10/02/19 0530  AST 16 28  ALT 14 15  ALKPHOS 141* 134*  BILITOT 0.3 0.7  PROT 5.4* 5.0*  ALBUMIN 1.9* 1.7*   No results for input(s): LIPASE, AMYLASE in the last 168 hours. Recent Labs  Lab 09/30/19 1529 10/02/19 0530  AMMONIA 16 24   Coagulation Profile: Recent Labs  Lab 09/30/19 1442  INR 0.9   Cardiac Enzymes: No results for input(s): CKTOTAL, CKMB, CKMBINDEX, TROPONINI in the last 168 hours. BNP (last 3 results) No results for input(s): PROBNP in the last 8760 hours. HbA1C: No results for input(s): HGBA1C in the last 72 hours. CBG: Recent Labs  Lab 10/01/19 0623 10/01/19 1215 10/01/19 1625 10/01/19 2219 10/02/19 0538  GLUCAP 93 152* 171* 133* 172*   Lipid Profile: No results for input(s): CHOL, HDL, LDLCALC, TRIG, CHOLHDL, LDLDIRECT in the last 72 hours. Thyroid Function Tests: Recent Labs    10/01/19 0902  TSH 3.102   Anemia Panel: Recent Labs    10/01/19 0902  VITAMINB12 778   Urine analysis:    Component Value Date/Time   COLORURINE YELLOW 03/25/2019 1548   APPEARANCEUR TURBID (A) 03/25/2019 1548   LABSPEC 1.007 03/25/2019 1548   PHURINE 7.0 03/25/2019 1548   GLUCOSEU NEGATIVE 03/25/2019 1548   HGBUR 2+ (A) 03/25/2019 1548   BILIRUBINUR NEGATIVE 12/08/2016 1115   KETONESUR NEGATIVE 03/25/2019 1548   PROTEINUR 2+ (A) 03/25/2019 1548   UROBILINOGEN 0.2 05/19/2015 0942   NITRITE NEGATIVE 03/25/2019 1548   LEUKOCYTESUR 3+ (A) 03/25/2019 1548   Sepsis Labs:  @[email or web address] (procalcitonin:4,lacticidven:4) ) Recent Results (from the past 240 hour(s))  SARS CORONAVIRUS 2 (TAT 6-24 HRS) Nasopharyngeal Nasopharyngeal Swab     Status: None   Collection Time: 09/30/19  7:10 PM   Specimen: Nasopharyngeal Swab  Result Value Ref Range Status   SARS Coronavirus 2 NEGATIVE NEGATIVE Final    Comment: (NOTE) SARS-CoV-2 target nucleic acids are NOT DETECTED. The SARS-CoV-2 RNA is generally detectable in upper and lower respiratory specimens during the acute phase of infection. Negative results do not preclude SARS-CoV-2 infection, do not rule out co-infections with other pathogens, and should not be used as the sole basis for treatment or other patient management decisions.  Negative results must be combined with clinical observations, patient history, and epidemiological information. The expected result is Negative. Fact Sheet for Patients: SugarRoll.be Fact Sheet for Healthcare Providers: https://www.woods-mathews.com/ This test is not yet approved or cleared by the [place] FDA and  has been authorized for detection and/or diagnosis of SARS-CoV-2 by FDA under an Emergency Use Authorization (EUA). This EUA will remain  in effect (meaning this test can be used) for the duration of the COVID-19 declaration under Section 56 4(b)(1) of the Act, 21 U.S.C. section 360bbb-3(b)(1), unless the authorization is terminated or revoked sooner. Performed at [facility], [place] [address], [place], [place] [address]   Surgical pcr screen     Status: None   Collection Time: 10/01/19  1:44 AM   Specimen: Nasal Mucosa; Nasal Swab  Result Value Ref Range Status   MRSA, PCR NEGATIVE NEGATIVE Final   Staphylococcus aureus NEGATIVE NEGATIVE Final    Comment: (NOTE) The Xpert SA Assay (FDA approved for NASAL specimens in patients 52 years of age and older), is one component of a comprehensive surveillance program. It is  not intended to diagnose infection nor to guide or monitor treatment. Performed at [facility], [place] [address] [place], [place] [address]          Radiology Studies: Ct Head Wo Contrast  Result Date: 09/30/2019 CLINICAL DATA:  Altered level of consciousness. EXAM: CT HEAD WITHOUT CONTRAST TECHNIQUE: Contiguous axial images were obtained from the base of the skull through the vertex without intravenous contrast. COMPARISON:  September 10, 2015. FINDINGS: Brain: Mild diffuse cortical atrophy is noted. Mild chronic ischemic white matter disease is noted. No mass effect or midline shift is noted. Ventricular size is within normal limits. There is no evidence of mass lesion, hemorrhage or acute infarction. Vascular: No hyperdense vessel or unexpected calcification. Skull: Normal. Negative for fracture or focal lesion. Sinuses/Orbits: Stable left maxillary mucous retention cyst is noted. Other: None. IMPRESSION: Mild diffuse cortical atrophy. Mild chronic ischemic white matter disease. No acute intracranial abnormality seen. Electronically Signed   By: Marijo Conception M.D.   On: 09/30/2019 15:12   Dg Chest Port 1 View  Result Date: 09/30/2019 CLINICAL DATA:  Altered EXAM: PORTABLE CHEST 1 VIEW COMPARISON:  Chest radiograph 09/02/2019 FINDINGS: Heart size within normal limits.  Aortic atherosclerosis. There is no focal consolidation within the lungs. No evidence of pleural effusion or pneumothorax. No acute bony abnormality. Overlying monitoring leads. IMPRESSION: No evidence of acute cardiopulmonary disease. Aortic atherosclerosis. Electronically Signed   By: Kellie Simmering DO   On: 09/30/2019 15:38      Scheduled Meds: . allopurinol  300 mg Oral Daily  . aspirin EC  81 mg Oral QHS  . feeding supplement (NEPRO CARB STEADY)  237 mL Oral TID BM  . gentamicin cream  1 application Topical Daily  . heparin  5,000 Units Subcutaneous Q8H  . insulin aspart  0-9 Units Subcutaneous TID WC  .  omega-3 acid ethyl esters  1 g Oral Daily  . pantoprazole  40 mg Oral BID  . rosuvastatin  5 mg Oral q1800  . sevelamer carbonate  800 mg Oral TID WC   Continuous Infusions: . dialysis solution 1.5% low-MG/low-CA       LOS: 1 day      Debbe Odea, MD [facility] Pager: [email or web address] Password [facility] 10/02/2019, 10:32 AM

## 2019-10-02 NOTE — Consult Note (Signed)
Consultation Note Date: 10/02/2019   Patient Name: Eileen Perez  DOB: 07/31/1941  MRN: [id number]  Age / Sex: 78 y.o., female  PCP: Unk Pinto, MD Referring Physician: Debbe Odea, MD  Reason for Consultation: Establishing goals of care  HPI/Patient Profile: 78 y.o. female  with past medical history of ESRD on PD (since 2017), anemia, GERD, gout, prediabetes, gastric carcinoid )on surveillance with GI), recent C. diff infection  admitted on 09/30/2019 with increased confusion of unknown etiology. Could be a uremia factor to confusion with plans for hemodialysis trial. Also plans for MRI head.   Clinical Assessment and Goals of Care: I met today with Eileen Perez. She is very alert and smile and greets me. She initially seemed to be more aware but quickly noticed she continues to be very confused. I asked Eileen if she knew where she is or why she is Eileen and she would respond yes but then when ask for details she does not know. She was unable to tell me where she is from. I provided reassurance that she is in the hospital and we are communicating with Eileen family.   I called and spoke with Eileen daughter, Eileen Perez, via telephone. She in in [place] but was with Eileen Perez Eileen first week at discharge, then Eileen brother was with Eileen, and then [facility] daughter has been with Eileen until this admission. Confusion has been a more acute issue but she has been struggling with weight loss and poor intake.  Eileen Perez is very clear that comfort and QOL is Eileen priority with for Eileen Perez. She is concerned with Eileen Perez's decline and if she needs transition to HD how she would tolerate and how this would work in Eileen QOL.They are also concerned because they cannot continue to provide 24/7 care for Eileen long term. Eileen Perez appears to have very good understanding of Eileen Perez's obstacles and potential poor prognosis.   We will  have conference call with all children tomorrow to further discuss [place].    Primary Decision Maker NEXT OF KIN daughter and son    SUMMARY OF RECOMMENDATIONS   - Plan for conference call with family for [place] tomorrow  Code Status/Advance Care Planning:  Full code - will discuss further tomorrow   Symptom Management:   Per primary  Palliative Prophylaxis:   Aspiration, Delirium Protocol, Frequent Pain Assessment and Turn Reposition  Psycho-social/Spiritual:   Desire for further Chaplaincy support:no  Additional Recommendations: Caregiving  Support/Resources and Education on Hospice  Prognosis:   Unable to determine. Overall prognosis poor with overall decline and failure to thrive with multiple admissions for multiple health complications.   Discharge Planning: To Be Determined      Primary Diagnoses: Present on Admission: . Acute encephalopathy . Essential hypertension . ESRD (end stage renal disease) ([place]) . Carcinoid tumor of stomach . Anemia of chronic Renal Dz   I have reviewed the medical record, interviewed the patient and family, and examined the patient. The following aspects are pertinent.  Past Medical History:  Diagnosis  Date  . Anemia   . Arthritis    Osteoarthritis  . Chronic kidney disease    Chronic renal insufficiency, peritoneal dialysis qday sees dr Moshe Cipro  . Diabetes mellitus    Pre  . Diverticulosis   . GERD (gastroesophageal reflux disease)   . Gout   . Hiatal hernia   . History of blood transfusion   . Hyperlipidemia   . Hypertension   . Osteopenia   . Pancreatic cyst 1999  . Thyroid disease    Hyperparathyroid    Social History   Socioeconomic History  . Marital status: Divorced    Spouse name: Not on file  . Number of children: 3  . Years of education: Not on file  . Highest education level: Not on file  Occupational History  . Occupation: Retired     Comment: Publishing copy  . Financial resource strain:  Not on file  . Food insecurity    Worry: Not on file    Inability: Not on file  . Transportation needs    Medical: Not on file    Non-medical: Not on file  Tobacco Use  . Smoking status: Never Smoker  . Smokeless tobacco: Never Used  Substance and Sexual Activity  . Alcohol use: No    Alcohol/week: 0.0 standard drinks  . Drug use: No  . Sexual activity: Not on file  Lifestyle  . Physical activity    Days per week: Not on file    Minutes per session: Not on file  . Stress: Not on file  Relationships  . Social Herbalist on phone: Not on file    Gets together: Not on file    Attends religious service: Not on file    Active member of club or organization: Not on file    Attends meetings of clubs or organizations: Not on file    Relationship status: Not on file  Other Topics Concern  . Not on file  Social History Narrative  . Not on file   Family History  Problem Relation Age of Onset  . Cancer Father        Prostate and Throat  . Hypertension Perez   . Cancer Perez        Colon with METS  . Diabetes Perez    Scheduled Meds: . allopurinol  300 mg Oral Daily  . aspirin EC  81 mg Oral QHS  . feeding supplement (NEPRO CARB STEADY)  237 mL Oral TID BM  . heparin  5,000 Units Subcutaneous Q8H  . insulin aspart  0-9 Units Subcutaneous TID WC  . omega-3 acid ethyl esters  1 g Oral Daily  . pantoprazole  40 mg Oral BID  . rosuvastatin  5 mg Oral q1800  . sevelamer carbonate  800 mg Oral TID WC   Continuous Infusions: . dialysis solution 1.5% low-MG/low-CA     PRN Meds:.acetaminophen **OR** acetaminophen, dianeal solution for CAPD/CCPD with heparin, ondansetron **OR** ondansetron (ZOFRAN) IV Allergies  Allergen Reactions  . Ace Inhibitors Other (See Comments)    unknown  . Nsaids Other (See Comments)    unknown   Review of Systems  Unable to perform ROS: Mental status change    Physical Exam Vitals signs and nursing note reviewed.  Constitutional:       General: She is not in acute distress.    Comments: Thin, frail, elderly  Cardiovascular:     Rate and Rhythm: Normal rate.  Pulmonary:  Effort: Pulmonary effort is normal. No tachypnea, accessory muscle usage or respiratory distress.  Abdominal:     Palpations: Abdomen is soft.  Neurological:     Mental Status: She is alert. She is confused.     Comments: Oriented to self only     Vital Signs: BP 115/70 (BP Location: Right Arm)   Pulse 92   Temp 97.7 F (36.5 C) (Oral)   Resp 16   Wt 45.7 kg   SpO2 100%   BMI 19.04 kg/m  Pain Scale: 0-10   Pain Score: 0-No pain   SpO2: SpO2: 100 % O2 Device:SpO2: 100 % O2 Flow Rate: .   IO: Intake/output summary:   Intake/Output Summary (Last 24 hours) at 10/02/2019 1321 Last data filed at 10/02/2019 1318 Gross per 24 hour  Intake 747 ml  Output 0 ml  Net 747 ml    LBM: Last BM Date: 10/01/19 Baseline Weight: Weight: 44.8 kg Most recent weight: Weight: 45.7 kg     Palliative Assessment/Data:     Time In: 1330 Time Out: 1430 Time Total: 60 MIN Greater than 50%  of this time was spent counseling and coordinating care related to the above assessment and plan.  Signed by: Vinie Sill, NP Palliative Medicine Team Pager # [phone] (M-F 8a-5p) Team Phone # [phone] (Nights/Weekends)

## 2019-10-02 NOTE — Consult Note (Signed)
Chief Complaint: Patient was seen in consultation today for renal failure  Referring Physician(s): Dr. Jonnie Finner  Supervising Physician: Arne Cleveland  Patient Status: [facility] - In-pt  History of Present Illness: Eileen Perez is a 78 y.o. female with past medical history of DM, GERD, HTN, HLD, and chronic kidney disease on peritoneal dailysis daily who presented to [facility] with increased confusion.  Patient reportedly lives alone and has been missing PD sessions with worsening mental status. She is in need of dialysis catheter placement for transition to hemodialysis.   Patient assessed at bedside this afternoon.  She is alert and sitting comfortably in chair. She is conversant, but somewhat confused.  Spoke with daughter Sherylynn regarding dialysis catheter placement.   Past Medical History:  Diagnosis Date  . Anemia   . Arthritis    Osteoarthritis  . Chronic kidney disease    Chronic renal insufficiency, peritoneal dialysis qday sees dr Moshe Cipro  . Diabetes mellitus    Pre  . Diverticulosis   . GERD (gastroesophageal reflux disease)   . Gout   . Hiatal hernia   . History of blood transfusion   . Hyperlipidemia   . Hypertension   . Osteopenia   . Pancreatic cyst 1999  . Thyroid disease    Hyperparathyroid     Past Surgical History:  Procedure Laterality Date  . BIOPSY  04/25/2019   Procedure: BIOPSY;  Surgeon: Otis Brace, MD;  Location: [facility];  Service: Gastroenterology;;  . BIOPSY  09/02/2019   Procedure: BIOPSY;  Surgeon: Otis Brace, MD;  Location: [place];  Service: Gastroenterology;;  . CHOLECYSTECTOMY    . COLONOSCOPY  03/21/12   Next one in 2018  . COLONOSCOPY WITH PROPOFOL N/A 04/25/2019   Procedure: COLONOSCOPY WITH PROPOFOL;  Surgeon: Otis Brace, MD;  Location: [facility];  Service: Gastroenterology;  Laterality: N/A;  . ESOPHAGOGASTRODUODENOSCOPY (EGD) WITH PROPOFOL N/A 04/25/2019   Procedure: ESOPHAGOGASTRODUODENOSCOPY (EGD)  WITH PROPOFOL;  Surgeon: Otis Brace, MD;  [facility]: [facility];  Service: Gastroenterology;  Laterality: N/A;  . ESOPHAGOGASTRODUODENOSCOPY (EGD) WITH PROPOFOL N/A 09/02/2019   Procedure: ESOPHAGOGASTRODUODENOSCOPY (EGD) WITH PROPOFOL;  Surgeon: Otis Brace, MD;  Location: [facility];  Service: Gastroenterology;  Laterality: N/A;  . EYE SURGERY Left    cataract extraction with IOL  . INSERTION OF DIALYSIS CATHETER N/A 02/15/2017   Procedure: REVISION OF DIALYSIS CATHETER;  Surgeon: Algernon Huxley, MD;  Location: [facility];  Service: Cardiovascular;  Laterality: N/A;  . IR FLUORO GUIDE CV LINE RIGHT  08/29/2019  . IR US GUIDE VASC ACCESS RIGHT  08/29/2019  . JOINT REPLACEMENT  2012   left knee  . left knee replacement     . PANCREATIC CYST EXCISION  1999  . POLYPECTOMY  04/25/2019   Procedure: POLYPECTOMY;  Surgeon: Otis Brace, MD;  Location: [facility];  Service: Gastroenterology;;  . Clide Deutscher  04/25/2019   Procedure: SCLEROTHERAPY;  Surgeon: Otis Brace, MD;  Location: [facility];  Service: Gastroenterology;;  . TOTAL HIP ARTHROPLASTY Right 05/26/2015   Procedure: RIGHT TOTAL HIP ARTHROPLASTY ANTERIOR APPROACH;  Surgeon: Gaynelle Arabian, MD;  Location: [facility];  Service: Orthopedics;  Laterality: Right;  . TOTAL KNEE ARTHROPLASTY Right 06/09/2013   Procedure: RIGHT TOTAL KNEE ARTHROPLASTY;  Surgeon: Gearlean Alf, MD;  Location: [facility];  Service: Orthopedics;  Laterality: Right;    Allergies: Ace inhibitors and Nsaids  Medications: Prior to Admission medications   Medication Sig Start Date End Date Taking? Authorizing Provider  allopurinol (ZYLOPRIM) 300 MG tablet Take  1 tablet daily for gout 09/16/19  Yes Unk Pinto, MD  alum & mag hydroxide-simeth (MAALOX/MYLANTA) 200-200-20 MG/5ML suspension Take 15 mLs by mouth 3 (three) times daily before meals. 09/14/19 10/14/19 Yes Nicole Kindred A, DO  aspirin 81 MG tablet Take 81 mg by mouth at bedtime.    Yes  [provider]  b complex-vitamin c-folic acid (NEPHRO-VITE) 0.8 MG TABS tablet Take 1 tablet by mouth daily.    Yes [provider]  Blood Glucose Monitoring Suppl DEVI Check blood glucose before meals, at bedtime and as needed. 09/18/19  Yes McClanahan, Danton Sewer, NP  gentamicin cream (GARAMYCIN) 0.1 % Apply 1 application topically daily. Patient taking differently: Apply 1 application topically every evening.  09/14/19  Yes Nicole Kindred A, DO  glucose blood test strip Use as instructed 09/18/19  Yes McClanahan, Kyra, NP  Lancets (ACCU-CHEK SOFT TOUCH) lancets Use as instructed 09/18/19  Yes McClanahan, Kyra, NP  lidocaine (XYLOCAINE) 2 % solution Use as directed 15 mLs in the mouth or throat 3 (three) times daily before meals. 09/14/19 10/14/19 Yes Ezekiel Slocumb, DO  Nutritional Supplements (FEEDING SUPPLEMENT, NEPRO CARB STEADY,) LIQD Take 237 mLs by mouth 3 (three) times daily between meals. 09/18/19 10/18/19 Yes McClanahan, Kyra, NP  ondansetron (ZOFRAN-ODT) 4 MG disintegrating tablet Take 1 tablet every 8 hours as needed for nausea and vomiting. 09/16/19  Yes Unk Pinto, MD  pantoprazole (PROTONIX) 40 MG tablet Takes 1 tablet twice a day for reflux. Patient taking differently: Take 40 mg by mouth 2 (two) times daily.  09/18/19  Yes McClanahan, Danton Sewer, NP  Potassium Chloride ER 20 MEQ TBCR Takes 1 tablet daily. Patient taking differently: Take 20 mEq by mouth daily. Takes 1 tablet daily. 09/16/19  Yes Unk Pinto, MD  rosuvastatin (CRESTOR) 5 MG tablet Take 1 tablet after supper for cholesterol. Patient taking differently: Take 5 mg by mouth daily after supper. Take 1 tablet after supper for cholesterol. 09/16/19  Yes Unk Pinto, MD  Vancomycin HCl 50 MG/ML SOLR Take 2.5 mLs by mouth every 6 (six) hours. For 10 days   Yes [provider]  ZINC GLUCONATE PO Take 40 mg by mouth daily.    Yes [provider]     Family History  Problem  Relation Age of Onset  . Cancer Father        Prostate and Throat  . Hypertension Mother   . Cancer Mother        Colon with METS  . Diabetes Mother     Social History   Socioeconomic History  . Marital status: Divorced    Spouse name: Not on file  . Number of children: 3  . Years of education: Not on file  . Highest education level: Not on file  Occupational History  . Occupation: Retired     Comment: Publishing copy  . Financial resource strain: Not on file  . Food insecurity    Worry: Not on file    Inability: Not on file  . Transportation needs    Medical: Not on file    Non-medical: Not on file  Tobacco Use  . Smoking status: Never Smoker  . Smokeless tobacco: Never Used  Substance and Sexual Activity  . Alcohol use: No    Alcohol/week: 0.0 standard drinks  . Drug use: No  . Sexual activity: Not on file  Lifestyle  . Physical activity    Days per week: Not on file    Minutes per  session: Not on file  . Stress: Not on file  Relationships  . Social Herbalist on phone: Not on file    Gets together: Not on file    Attends religious service: Not on file    Active member of club or organization: Not on file    Attends meetings of clubs or organizations: Not on file    Relationship status: Not on file  Other Topics Concern  . Not on file  Social History Narrative  . Not on file     Review of Systems: A 12 point ROS discussed and pertinent positives are indicated in the HPI above.  All other systems are negative.  Review of Systems  Constitutional: Negative for fatigue and fever.  Respiratory: Negative for cough and shortness of breath.   Cardiovascular: Negative for chest pain.  Gastrointestinal: Negative for abdominal pain.  Genitourinary: Negative for dysuria.  Musculoskeletal: Negative for back pain.  Psychiatric/Behavioral: Negative for behavioral problems and confusion.    Vital Signs: BP 115/70 (BP Location: Right Arm)   Pulse  92   Temp 97.7 F (36.5 C) (Oral)   Resp 16   Wt 100 lb 12 oz (45.7 kg)   SpO2 100%   BMI 19.04 kg/m   Physical Exam Vitals signs and nursing note reviewed.  Constitutional:      General: She is not in acute distress.    Appearance: Normal appearance.  HENT:     Mouth/Throat:     Mouth: Mucous membranes are moist.     Pharynx: Oropharynx is clear.  Neck:     Musculoskeletal: Normal range of motion and neck supple.  Cardiovascular:     Rate and Rhythm: Normal rate and regular rhythm.     Pulses: Normal pulses.  Pulmonary:     Effort: Pulmonary effort is normal. No respiratory distress.     Breath sounds: Normal breath sounds.  Abdominal:     General: Abdomen is flat.     Palpations: Abdomen is soft.  Skin:    General: Skin is warm and dry.  Neurological:     General: No focal deficit present.     Mental Status: She is alert and oriented to person, place, and time. Mental status is at baseline.  Psychiatric:        Mood and Affect: Mood normal.        Behavior: Behavior normal.        Thought Content: Thought content normal.        Judgment: Judgment normal.      MD Evaluation Airway: WNL Heart: WNL Abdomen: WNL Chest/ Lungs: WNL ASA  Classification: 3 Mallampati/Airway Score: One   Imaging: Dg Abd 1 View  Result Date: 09/05/2019 CLINICAL DATA:  Intractable vomiting. EXAM: ABDOMEN - 1 VIEW COMPARISON:  08/30/2019. FINDINGS: Peritoneal dialysis catheter noted with tip over the pelvis. Surgical clips right upper quadrant. No bowel distention or free air. Calcified pelvic densities noted consistent with fibroids. Aortoiliac and peripheral atherosclerotic vascular calcification. Degenerative change lumbar spine and left hip. Right hip replacement. IMPRESSION: One peritoneal dialysis catheter noted with tip over the pelvis. No acute intra-abdominal abnormality identified. No bowel distention or free air. 2.  Calcified pelvic fibroids. 3.  Aortoiliac and peripheral  vascular disease. Electronically Signed   By: Marcello Moores  Register   On: 09/05/2019 10:38   Ct Head Wo Contrast  Result Date: 09/30/2019 CLINICAL DATA:  Altered level of consciousness. EXAM: CT HEAD WITHOUT CONTRAST TECHNIQUE: Contiguous axial  images were obtained from the base of the skull through the vertex without intravenous contrast. COMPARISON:  September 10, 2015. FINDINGS: Brain: Mild diffuse cortical atrophy is noted. Mild chronic ischemic white matter disease is noted. No mass effect or midline shift is noted. Ventricular size is within normal limits. There is no evidence of mass lesion, hemorrhage or acute infarction. Vascular: No hyperdense vessel or unexpected calcification. Skull: Normal. Negative for fracture or focal lesion. Sinuses/Orbits: Stable left maxillary mucous retention cyst is noted. Other: None. IMPRESSION: Mild diffuse cortical atrophy. Mild chronic ischemic white matter disease. No acute intracranial abnormality seen. Electronically Signed   By: Marijo Conception M.D.   On: 09/30/2019 15:12   Dg Chest Port 1 View  Result Date: 09/30/2019 CLINICAL DATA:  Altered EXAM: PORTABLE CHEST 1 VIEW COMPARISON:  Chest radiograph 09/02/2019 FINDINGS: Heart size within normal limits.  Aortic atherosclerosis. There is no focal consolidation within the lungs. No evidence of pleural effusion or pneumothorax. No acute bony abnormality. Overlying monitoring leads. IMPRESSION: No evidence of acute cardiopulmonary disease. Aortic atherosclerosis. Electronically Signed   By: Kellie Simmering DO   On: 09/30/2019 15:38   Dg Abd Acute 2+v W 1v Chest  Result Date: 09/10/2019 CLINICAL DATA:  Concern for small bowel obstruction EXAM: DG ABDOMEN ACUTE W/ 1V CHEST COMPARISON:  Abdominal radiograph dated 09/05/2019 FINDINGS: There is no evidence of dilated bowel loops or free intraperitoneal air. Degenerative changes are seen in the left hip and lumbar spine. A right hip arthroplasty is seen. A peritoneal dialysis  catheter is noted in the pelvis. A calcified fibroid is redemonstrated. Heart size and mediastinal contours are within normal limits. Vascular calcifications are seen in the aortic arch. Both lungs are clear. IMPRESSION: No evidence of bowel obstruction or free intraperitoneal air. No acute cardiopulmonary findings. Aortic Atherosclerosis (ICD10-I70.0). Electronically Signed   By: Zerita Boers M.D.   On: 09/10/2019 17:20    Labs:  CBC: Recent Labs    09/14/19 0449 09/30/19 1442 10/01/19 0336 10/02/19 0530  WBC 9.9 13.6* 16.3* 11.4*  HGB 9.5* 11.0* 10.5* 10.4*  HCT 29.5* 37.5 35.7* 35.7*  PLT 233 293 297 255    COAGS: Recent Labs    09/30/19 1442  INR 0.9    BMP: Recent Labs    09/18/19 1138 09/30/19 1442 10/01/19 0336 10/02/19 0530  NA 137 135 135 134*  K 4.4 4.5 5.3* 4.7  CL 98 95* 95* 94*  CO2 28 26 27 25   GLUCOSE 135* 152* 103* 171*  BUN 45* 32* 36* 40*  CALCIUM 9.5 10.6* 10.6* 10.4*  CREATININE 7.04* 7.30* 8.50* 8.39*  GFRNONAA 5* 5* 4* 4*  GFRAA 6* 6* 5* 5*    LIVER FUNCTION TESTS: Recent Labs    09/11/19 0406 09/12/19 0440 09/30/19 1442 10/02/19 0530  BILITOT 0.9 0.5 0.3 0.7  AST 16 14* 16 28  ALT 14 12 14 15   ALKPHOS 93 90 141* 134*  PROT 5.6* 4.7* 5.4* 5.0*  ALBUMIN 1.9* 1.7* 1.9* 1.7*    TUMOR MARKERS: No results for input(s): AFPTM, CEA, CA199, CHROMGRNA in the last 8760 hours.  Assessment and Plan: End stage renal disease Patient with renal failure, maintained on peritoneal dialysis for the past 3 years.   Previously was doing her own exchanges, however with recent confusion and worsening mental status at home.  Now at [facility] with confusion and in need of transition to HD.  IR consulted for tunneled dialysis catheter placement.  NPO p MN. Ancef  ordered.    Risks and benefits discussed with the patient including, but not limited to bleeding, infection, vascular injury, pneumothorax which may require chest tube placement, air embolism or  even death  All of the patient's questions were answered, patient is agreeable to proceed. Consent signed and in chart.  Thank you for this interesting consult.  I greatly enjoyed meeting Corine Solorio and look forward to participating in their care.  A copy of this report was sent to the requesting provider on this date.  Electronically Signed: Docia Barrier, PA 10/02/2019, 3:57 PM   I spent a total of 20 Minutes    in face to face in clinical consultation, greater than 50% of which was counseling/coordinating care for renal failure.

## 2019-10-02 NOTE — Progress Notes (Signed)
In out cath performed for UA and urine c&s minimal urine obtained sent what I could as patient is anuric.

## 2019-10-02 NOTE — Progress Notes (Signed)
[facility] Progress Note  Subjective: feeling ok, still confused  , a bit better today  Vitals:   10/02/19 0656 10/02/19 0709 10/02/19 0745 10/02/19 1154  BP:  (!) 111/59 (!) 106/54 115/70  Pulse:  82 83 92  Resp:  16 15 16   Temp:  97.8 F (36.6 C) 97.8 F (36.6 C) 97.7 F (36.5 C)  TempSrc:  Oral Oral Oral  SpO2:  100% 98% 100%  Weight: 45.4 kg 45.7 kg      Inpatient medications: . allopurinol  300 mg Oral Daily  . aspirin EC  81 mg Oral QHS  . feeding supplement (NEPRO CARB STEADY)  237 mL Oral TID BM  . gentamicin cream  1 application Topical Daily  . heparin  5,000 Units Subcutaneous Q8H  . insulin aspart  0-9 Units Subcutaneous TID WC  . omega-3 acid ethyl esters  1 g Oral Daily  . pantoprazole  40 mg Oral BID  . rosuvastatin  5 mg Oral q1800  . sevelamer carbonate  800 mg Oral TID WC   . dialysis solution 1.5% low-MG/low-CA     acetaminophen **OR** acetaminophen, dianeal solution for CAPD/CCPD with heparin, ondansetron **OR** ondansetron (ZOFRAN) IV    Exam: Gen alert, disoriented, pleasant and conversant No rash, cyanosis or gangrene Sclera anicteric, throat clear  No jvd or bruits Chest clear bilat to bses RRR no MRG Abd soft ntnd no mass or ascites LLQ PD cath +bs GU defer MS no joint effusions or deformity Ext no LE edema, no wounds or ulcers Neuro is alert, Ox 1 only, "[place]", doesn't know the building, why she is here or who she lives with at home, doesn't know the year    Home meds:  - allopurinol 300/ pantoprazole 40 bid  - rosuvastatin 5 qd/ Kdur 20 qd  - prn's/ vitamins/ supplements     Outpt PD: 5 exchanges, 2500 dwell, 2 hr each,  no day bag, edw 48.5kg  - started in 2017, last clearance here was 2.05 on 08/2019.       Assessment/ Plan: 1. AMS/ confusion - reportedly worsening over this year and acutely the past 1-2 weeks.  Not doing her PD properly.  May have some uremic component. A little better  today.  Talked w/ daughter, pt lives alone and all her family are out of town. Pt confused can't do PD reliably, will dc PD and do a trial of hemodialysis here, hopefully her MS will recover. In the meantime will consult palliative care for [place].  2. ESRD - on PD x 3 yrs. As above, transition to HD trial for now.  3. Volume - no vol excess on exam, under dry wt 4. Anemia ckd - Hb 10.5, no need esa for now    Rob Raiquan Chandler 10/02/2019, 12:00 PM  Iron/TIBC/Ferritin/ %Sat    Component Value Date/Time   IRON 90 03/25/2019 1141   TIBC 215 (L) 03/25/2019 1141   FERRITIN 1,467 (H) 03/25/2019 1141   IRONPCTSAT 42 03/25/2019 1141   Recent Labs  Lab 09/30/19 1442  10/02/19 0530  NA 135   < > 134*  K 4.5   < > 4.7  CL 95*   < > 94*  CO2 26   < > 25  GLUCOSE 152*   < > 171*  BUN 32*   < > 40*  CREATININE 7.30*   < > 8.39*  CALCIUM 10.6*   < > 10.4*  PHOS 2.2*  --   --  ALBUMIN 1.9*  --  1.7*  INR 0.9  --   --    < > = values in this interval not displayed.   Recent Labs  Lab 10/02/19 0530  AST 28  ALT 15  ALKPHOS 134*  BILITOT 0.7  PROT 5.0*   Recent Labs  Lab 10/02/19 0530  WBC 11.4*  HGB 10.4*  HCT 35.7*  PLT 255

## 2019-10-02 NOTE — Plan of Care (Signed)
  Problem: Education: Goal: Knowledge of General Education information will improve Description: Including pain rating scale, medication(s)/side effects and non-pharmacologic comfort measures Outcome: Progressing   Problem: Health Behavior/Discharge Planning: Goal: Ability to manage health-related needs will improve Outcome: Progressing   Problem: Clinical Measurements: Goal: Ability to maintain clinical measurements within normal limits will improve Outcome: Progressing Goal: Will remain free from infection Outcome: Progressing Goal: Diagnostic test results will improve Outcome: Progressing Goal: Cardiovascular complication will be avoided Outcome: Progressing   Problem: Activity: Goal: Risk for activity intolerance will decrease Outcome: Progressing   Problem: Nutrition: Goal: Adequate nutrition will be maintained Outcome: Progressing   Problem: Coping: Goal: Level of anxiety will decrease Outcome: Progressing   Problem: Coping: Goal: Level of anxiety will decrease Outcome: Progressing   Problem: Elimination: Goal: Will not experience complications related to bowel motility Outcome: Progressing   Problem: Pain Managment: Goal: General experience of comfort will improve Outcome: Progressing   Problem: Safety: Goal: Ability to remain free from injury will improve Outcome: Progressing   Problem: Skin Integrity: Goal: Risk for impaired skin integrity will decrease Outcome: Progressing

## 2019-10-02 NOTE — Progress Notes (Signed)
In to perform in and out cath for urine culture as requested via MD patient reported did not want to go to bed right now was sitting up in geri chair at present would allow she goes back to bed.

## 2019-10-03 ENCOUNTER — Inpatient Hospital Stay (HOSPITAL_COMMUNITY): Payer: Medicare Other

## 2019-10-03 ENCOUNTER — Encounter (HOSPITAL_COMMUNITY): Payer: Self-pay | Admitting: Interventional Radiology

## 2019-10-03 HISTORY — PX: IR US GUIDE VASC ACCESS RIGHT: IMG2390

## 2019-10-03 HISTORY — PX: IR FLUORO GUIDE CV LINE RIGHT: IMG2283

## 2019-10-03 LAB — BASIC METABOLIC PANEL
Anion gap: 17 — ABNORMAL HIGH (ref 5–15)
BUN: 40 mg/dL — ABNORMAL HIGH (ref 8–23)
CO2: 26 mmol/L (ref 22–32)
Calcium: 10.3 mg/dL (ref 8.9–10.3)
Chloride: 92 mmol/L — ABNORMAL LOW (ref 98–111)
Creatinine, Ser: 7.97 mg/dL — ABNORMAL HIGH (ref 0.44–1.00)
GFR calc Af Amer: 5 mL/min — ABNORMAL LOW (ref >60)
GFR calc non Af Amer: 4 mL/min — ABNORMAL LOW (ref >60)
Glucose, Bld: 168 mg/dL — ABNORMAL HIGH (ref 70–99)
Potassium: 4 mmol/L (ref 3.5–5.1)
Sodium: 135 mmol/L (ref 135–145)

## 2019-10-03 LAB — GLUCOSE, CAPILLARY
Glucose-Capillary: 162 mg/dL — ABNORMAL HIGH (ref 70–99)
Glucose-Capillary: 84 mg/dL (ref 70–99)
Glucose-Capillary: 71 mg/dL (ref 70–99)
Glucose-Capillary: 86 mg/dL (ref 70–99)

## 2019-10-03 LAB — HEPATITIS B SURFACE ANTIGEN: Hepatitis B Surface Ag: NONREACTIVE

## 2019-10-03 LAB — CBC
HCT: 32.9 % — ABNORMAL LOW (ref 36.0–46.0)
Hemoglobin: 9.5 g/dL — ABNORMAL LOW (ref 12.0–15.0)
MCH: 29.4 pg (ref 26.0–34.0)
MCHC: 28.9 g/dL — ABNORMAL LOW (ref 30.0–36.0)
MCV: 101.9 fL — ABNORMAL HIGH (ref 80.0–100.0)
Platelets: 301 10*3/uL (ref 150–400)
RBC: 3.23 MIL/uL — ABNORMAL LOW (ref 3.87–5.11)
RDW: 17 % — ABNORMAL HIGH (ref 11.5–15.5)
WBC: 9 10*3/uL (ref 4.0–10.5)
nRBC: 0.2 % (ref 0.0–0.2)

## 2019-10-03 LAB — HEPATITIS B SURFACE ANTIBODY,QUALITATIVE: Hep B S Ab: NONREACTIVE

## 2019-10-03 LAB — HEPATITIS B CORE ANTIBODY, TOTAL: Hep B Core Total Ab: NONREACTIVE

## 2019-10-03 MED ORDER — LIDOCAINE HCL 1 % IJ SOLN
INTRAMUSCULAR | Status: AC | PRN
  Administered 2019-10-03: 8 mL

## 2019-10-03 MED ORDER — LIDOCAINE HCL 1 % IJ SOLN
INTRAMUSCULAR | Status: AC
  Filled 2019-10-03: qty 20

## 2019-10-03 MED ORDER — MIDAZOLAM HCL 2 MG/2ML IJ SOLN
INTRAMUSCULAR | Status: AC
  Filled 2019-10-03: qty 2

## 2019-10-03 MED ORDER — GELATIN ABSORBABLE 12-7 MM EX MISC
CUTANEOUS | Status: AC | PRN
  Administered 2019-10-03: 1 via TOPICAL

## 2019-10-03 MED ORDER — CEFAZOLIN SODIUM-DEXTROSE 2-4 GM/100ML-% IV SOLN
INTRAVENOUS | Status: AC
  Administered 2019-10-03: 2000 mg
  Filled 2019-10-03: qty 100

## 2019-10-03 MED ORDER — FENTANYL CITRATE (PF) 100 MCG/2ML IJ SOLN
INTRAMUSCULAR | Status: AC
  Filled 2019-10-03: qty 2

## 2019-10-03 MED ORDER — HEPARIN SODIUM (PORCINE) 1000 UNIT/ML IJ SOLN
INTRAMUSCULAR | Status: AC
  Filled 2019-10-03: qty 1

## 2019-10-03 MED ORDER — HEPARIN SODIUM (PORCINE) 1000 UNIT/ML IJ SOLN
INTRAMUSCULAR | Status: AC
  Filled 2019-10-03: qty 4

## 2019-10-03 MED ORDER — CHLORHEXIDINE GLUCONATE CLOTH 2 % EX PADS
6.0000 | MEDICATED_PAD | Freq: Every day | CUTANEOUS | Status: DC
  Administered 2019-10-04 – 2019-10-11 (×8): 6 via TOPICAL

## 2019-10-03 MED ORDER — MIDAZOLAM HCL 2 MG/2ML IJ SOLN
INTRAMUSCULAR | Status: AC | PRN
  Administered 2019-10-03: 1 mg via INTRAVENOUS

## 2019-10-03 MED ORDER — FENTANYL CITRATE (PF) 100 MCG/2ML IJ SOLN
INTRAMUSCULAR | Status: AC | PRN
  Administered 2019-10-03: 25 ug via INTRAVENOUS

## 2019-10-03 MED ORDER — HEPARIN SODIUM (PORCINE) 1000 UNIT/ML IJ SOLN
INTRAMUSCULAR | Status: AC | PRN
  Administered 2019-10-03: 3.2 mL via INTRAVENOUS

## 2019-10-03 MED ORDER — HEPARIN SODIUM (PORCINE) 1000 UNIT/ML DIALYSIS
2000.0000 [IU] | INTRAMUSCULAR | Status: DC | PRN
  Administered 2019-10-03: 3200 [IU] via INTRAVENOUS_CENTRAL
  Filled 2019-10-03: qty 2

## 2019-10-03 MED ORDER — LIDOCAINE VISCOUS HCL 2 % MT SOLN
15.0000 mL | Freq: Three times a day (TID) | OROMUCOSAL | Status: DC
  Administered 2019-10-04 – 2019-10-11 (×22): 15 mL via OROMUCOSAL
  Filled 2019-10-03 (×22): qty 15

## 2019-10-03 MED ORDER — GELATIN ABSORBABLE 12-7 MM EX MISC
CUTANEOUS | Status: AC
  Filled 2019-10-03: qty 1

## 2019-10-03 NOTE — Consult Note (Signed)
   [facility] CM Inpatient Consult   10/03/2019  Eileen Perez 1941-02-22 [id number]   Patient screened for high risk score for unplanned readmission score and for less than 30 days re-hospitalization.  Review of patient's medical record reveals patient is patient is currently being recommended for a skilled facility disposition.  Also, reviewed Palliative Care consult.  Patient with [facility] in [place].  Primary Care Provider is Unk Pinto, MD noted.  Plan: Follow for disposition and if transitions to SNF no [facility] Care Management will be followed.   For questions contact:   Natividad Brood, RN BSN [facility] Liaison  [phone] business mobile phone Toll free office [phone]  Fax number: [phone] [email or web address]

## 2019-10-03 NOTE — Progress Notes (Signed)
PT Cancellation Note  Patient Details Name: Debbi Strandberg MRN: [id number] DOB: 1941-06-27   Cancelled Treatment:    Reason Eval/Treat Not Completed: Patient at procedure or test/unavailable Pt currently getting peritoneal dialysis, will follow.   Marguarite Arbour A Brenton Joines 10/03/2019, 8:03 AM Marisa Severin, PT, DPT Acute Rehabilitation Services Pager [phone] Office [phone]

## 2019-10-03 NOTE — Progress Notes (Signed)
Physical Therapy Treatment Patient Details Name: Eileen Perez MRN: [id number] DOB: [birth year] Today's Date: 10/03/2019    History of Present Illness 78 y.o. 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 Plan for dialysis catheter 11/13.    PT Comments    Patient progressing well towards PT goals. Tolerated gait training with supervision and use of RW for support. Requires cues for RW management esp during turns. Noted to fatigue quickly. Continues to be confused, oriented to self only but able to [facility] with some cues. Encouraged walking with nursing daily. Continue to recommend SNF due to cognitive deficits and poor safety awareness. Will follow.   Follow Up Recommendations  SNF     Equipment Recommendations  None recommended by PT    Recommendations for Other Services       Precautions / Restrictions Precautions Precautions: Fall Restrictions Weight Bearing Restrictions: No    Mobility  Bed Mobility Overal bed mobility: Needs Assistance Bed Mobility: Supine to Sit     Supine to sit: Supervision;HOB elevated     General bed mobility comments: Supervision for safety.  Transfers Overall transfer level: Needs assistance Equipment used: Rolling walker (2 wheeled) Transfers: Sit to/from Stand Sit to Stand: Min guard         General transfer comment: min guard with extra time, transferred to chair post ambulation  Ambulation/Gait Ambulation/Gait assistance: Supervision Gait Distance (Feet): 175 Feet Assistive device: Rolling walker (2 wheeled) Gait Pattern/deviations: Step-through pattern;Decreased stride length Gait velocity: decreased   General Gait Details: Slow, mostly steady gait with  cues for RW management esp with turns. Fatigues.   Stairs             Wheelchair Mobility    Modified Rankin (Stroke Patients Only)       Balance Overall balance assessment: Needs assistance Sitting-balance support: Bilateral upper extremity supported;Feet supported Sitting balance-Leahy Scale: Good     Standing balance support: During functional activity Standing balance-Leahy Scale: Fair Standing balance comment: UE support for balance                            Cognition Arousal/Alertness: Awake/alert Behavior During Therapy: Flat affect Overall Cognitive Status: Impaired/Different from baseline Area of Impairment: Orientation;Memory;Safety/judgement;Attention                 Orientation Level: Disoriented to;Place;Time;Situation Current Attention Level: Sustained Memory: Decreased short-term memory Following Commands: Follows one step commands consistently Safety/Judgement: Decreased awareness of safety;Decreased awareness of deficits Awareness: Intellectual Problem Solving: Slow processing;Requires verbal cues General Comments: Remains flat.      Exercises      General Comments General comments (skin integrity, edema, etc.): VSS throughout      Pertinent Vitals/Pain Pain Assessment: No/denies pain    Home Living                      Prior Function            PT Goals (current goals can now be found in the care plan section) Progress towards PT goals: Progressing toward goals    Frequency    Min 2X/week      PT Plan Current plan remains appropriate;Frequency needs to be updated    Co-evaluation  AM-PAC PT "6 Clicks" Mobility   Outcome Measure  Help needed turning from your back to your side while in a flat bed without using bedrails?: None Help needed moving from lying on your back to sitting on the side of a flat bed without using bedrails?: None Help needed moving to and from a bed to a  chair (including a wheelchair)?: A Little Help needed standing up from a chair using your arms (e.g., wheelchair or bedside chair)?: A Little Help needed to walk in hospital room?: A Little Help needed climbing 3-5 steps with a railing? : A Little 6 Click Score: 20    End of Session Equipment Utilized During Treatment: Gait belt Activity Tolerance: Patient tolerated treatment well Patient left: in chair;with call bell/phone within reach;with chair alarm set Nurse Communication: Mobility status PT Visit Diagnosis: Muscle weakness (generalized) (M62.81);Other symptoms and signs involving the nervous system (R29.898)     Time: 1145-1200 PT Time Calculation (min) (ACUTE ONLY): 15 min  Charges:  $Gait Training: 8-22 mins                     Marisa Severin, PT, DPT Acute Rehabilitation Services Pager [phone] Office [phone]       [place] 10/03/2019, 12:55 PM

## 2019-10-03 NOTE — Progress Notes (Signed)
Pt to move to [facility] following HD treatment.  HD cath placed today with first HD treatment performed today.  Updated family concerning transfer by calling son Meridee Score.

## 2019-10-03 NOTE — Progress Notes (Signed)
PROGRESS NOTE    Eileen Perez   XKG:[id number]  DOB: 04-22-1941  DOA: 09/30/2019 PCP: Unk Pinto, MD   Brief Narrative:  Eileen Perez the patient is a 78 y.o. year-old who lives alone with ESRD on peritoneal dialysis, gastric carcinoid, gout, DM2   presenting to ED with confusion. She lives alone. Her grand daugther noted confusion over the weekend and her [facility] went to check on her on Tuesday and noted severe confusion. Her daughter [name] does not live with her tells me that a dialysis RN or a family member were helping with her dialysis. She was also eating poorly this past week.   In ED> head CT unrevealing, Ammonia level negative   Subjective: More oriented today. She has no complaints. Per RN, she had 4 loose stools last night and is not on enteric precautions.  Assessment & Plan:   Principal Problem:   Acute metabolic encephalopathy - the patient was sharp enough to do her own dialysis but has been confused about 1 wk now -  thought to be due to inability to do dialysis properly and subsequent uremia - she remains confused today but I feel she is a little improved- plans are for a dialysis cath today and then HD- this may improver her cognitive status further- will d/c MRI for now - B12 is 778 , ammonia 16-     Active Problems:     ESRD (end stage renal disease) on peritoneal dialysis since 2017 - appreciate nephrology assisting with dialysis- HD to be started  - palliative care was consulted  Diarrhea - abdomen non-tender, no leukocytosis of fevers thus likely not infectious - d/c Nepro- follow     Anemia of chronic disease - stable  Gout - cont allopurinol  Recent C diff colitis - no evidence of recurrence- WBC 16 on 11/11 but improved todau  Weight loss - per daughter she lost about 13 lb but has recently gained some back  Hypoalbuminemia - ? If related to recent C diff infection  DM2 - last A1c 6.5 in 4/20- not on medications at home so I  suspect it is diet controlled  HLD Crestor   Carcinoid tumor of stomach   Time spent in minutes: 35  DVT prophylaxis: Heparin Code Status: Full code Family Communication:  Daughter [name] Disposition Plan: to be determined Consultants:   Nephrology  Palliative care Procedures:     Antimicrobials:  Anti-infectives (From admission, onward)   Start     Dose/Rate Route Frequency Ordered Stop   10/03/19 0000  ceFAZolin (ANCEF) IVPB 2g/100 mL premix     2 g 200 mL/hr over 30 Minutes Intravenous  Once 10/02/19 1703 10/03/19 0249   10/02/19 1530  ceFAZolin (ANCEF) IVPB 2g/100 mL premix  Status:  Discontinued     2 g 200 mL/hr over 30 Minutes Intravenous  Once 10/02/19 1518 10/02/19 1703   10/01/19 0800  vancomycin (VANCOCIN) 50 mg/mL oral solution 125 mg  Status:  Discontinued     125 mg Oral 4 times daily 10/01/19 0631 10/01/19 1313       Objective: Vitals:   10/02/19 2039 10/03/19 0653 10/03/19 0915 10/03/19 1030  BP: 104/62 113/69 106/63 (!) 108/59  Pulse: 88 87 88 95  Resp: 20 19 18 16   Temp:  98.6 F (37 C) 97.8 F (36.6 C) 98 F (36.7 C)  TempSrc:  Oral Oral Oral  SpO2: 99% (!) 73% 100% 100%  Weight:  45.6 kg 45.5 kg  Intake/Output Summary (Last 24 hours) at 10/03/2019 1236 Last data filed at 10/03/2019 0343 Gross per 24 hour  Intake 235.41 ml  Output 100 ml  Net 135.41 ml   Filed Weights   10/02/19 0709 10/03/19 0653 10/03/19 0915  Weight: 45.7 kg 45.6 kg 45.5 kg    Examination: General exam: Appears comfortable  HEENT: PERRLA, oral mucosa moist, no sclera icterus or thrush Respiratory system: Clear to auscultation. Respiratory effort normal. Cardiovascular system: S1 & S2 heard,  No murmurs  Gastrointestinal system: Abdomen soft, non-tender, nondistended. Normal bowel sounds  - peritoneal cath in place Central nervous system: Alert and oriented to person and to year- not to place, month or situation. No focal neurological deficits.  Extremities: No cyanosis, clubbing or edema Skin: No rashes or ulcers Psychiatry:  Mood & affect appropriate.     Data Reviewed: I have personally reviewed following labs and imaging studies  CBC: Recent Labs  Lab 09/30/19 1442 10/01/19 0336 10/02/19 0530 10/03/19 0434  WBC 13.6* 16.3* 11.4* 9.0  NEUTROABS 11.6*  --   --   --   HGB 11.0* 10.5* 10.4* 9.5*  HCT 37.5 35.7* 35.7* 32.9*  MCV 101.9* 101.4* 102.3* 101.9*  PLT 293 297 255 712   Basic Metabolic Panel: Recent Labs  Lab 09/30/19 1442 10/01/19 0336 10/02/19 0530 10/03/19 0434  NA 135 135 134* 135  K 4.5 5.3* 4.7 4.0  CL 95* 95* 94* 92*  CO2 26 27 25 26   GLUCOSE 152* 103* 171* 168*  BUN 32* 36* 40* 40*  CREATININE 7.30* 8.50* 8.39* 7.97*  CALCIUM 10.6* 10.6* 10.4* 10.3  MG 3.2*  --   --   --   PHOS 2.2*  --   --   --    GFR: Estimated Creatinine Clearance: 4.1 mL/min (A) (by C-G formula based on SCr of 7.97 mg/dL (H)). Liver Function Tests: Recent Labs  Lab 09/30/19 1442 10/02/19 0530  AST 16 28  ALT 14 15  ALKPHOS 141* 134*  BILITOT 0.3 0.7  PROT 5.4* 5.0*  ALBUMIN 1.9* 1.7*   No results for input(s): LIPASE, AMYLASE in the last 168 hours. Recent Labs  Lab 09/30/19 1529 10/02/19 0530  AMMONIA 16 24   Coagulation Profile: Recent Labs  Lab 09/30/19 1442  INR 0.9   Cardiac Enzymes: No results for input(s): CKTOTAL, CKMB, CKMBINDEX, TROPONINI in the last 168 hours. BNP (last 3 results) No results for input(s): PROBNP in the last 8760 hours. HbA1C: No results for input(s): HGBA1C in the last 72 hours. CBG: Recent Labs  Lab 10/02/19 1156 10/02/19 1607 10/02/19 2149 10/03/19 0641 10/03/19 1216  GLUCAP 112* 114* 159* 162* 84   Lipid Profile: No results for input(s): CHOL, HDL, LDLCALC, TRIG, CHOLHDL, LDLDIRECT in the last 72 hours. Thyroid Function Tests: Recent Labs    10/01/19 0902  TSH 3.102   Anemia Panel: Recent Labs    10/01/19 0902  VITAMINB12 778   Urine analysis:     Component Value Date/Time   COLORURINE AMBER (A) 10/02/2019 1600   APPEARANCEUR CLOUDY (A) 10/02/2019 1600   LABSPEC 1.012 10/02/2019 1600   PHURINE 6.0 10/02/2019 1600   GLUCOSEU 50 (A) 10/02/2019 1600   HGBUR MODERATE (A) 10/02/2019 1600   BILIRUBINUR NEGATIVE 10/02/2019 1600   KETONESUR NEGATIVE 10/02/2019 1600   PROTEINUR >=300 (A) 10/02/2019 1600   UROBILINOGEN 0.2 05/19/2015 0942   NITRITE NEGATIVE 10/02/2019 1600   LEUKOCYTESUR LARGE (A) 10/02/2019 1600   Sepsis Labs: @LABRCNTIP (procalcitonin:4,lacticidven:4) ) Recent Results (  from the past 240 hour(s))  SARS CORONAVIRUS 2 (TAT 6-24 HRS) Nasopharyngeal Nasopharyngeal Swab     Status: None   Collection Time: 09/30/19  7:10 PM   Specimen: Nasopharyngeal Swab  Result Value Ref Range Status   SARS Coronavirus 2 NEGATIVE NEGATIVE Final    Comment: (NOTE) SARS-CoV-2 target nucleic acids are NOT DETECTED. The SARS-CoV-2 RNA is generally detectable in upper and lower respiratory specimens during the acute phase of infection. Negative results do not preclude SARS-CoV-2 infection, do not rule out co-infections with other pathogens, and should not be used as the sole basis for treatment or other patient management decisions. Negative results must be combined with clinical observations, patient history, and epidemiological information. The expected result is Negative. Fact Sheet for Patients: SugarRoll.be Fact Sheet for Healthcare Providers: https://www.woods-mathews.com/ This test is not yet approved or cleared by the [place] FDA and  has been authorized for detection and/or diagnosis of SARS-CoV-2 by FDA under an Emergency Use Authorization (EUA). This EUA will remain  in effect (meaning this test can be used) for the duration of the COVID-19 declaration under Section 56 4(b)(1) of the Act, 21 U.S.C. section 360bbb-3(b)(1), unless the authorization is terminated or revoked  sooner. Performed at [facility], [place] [address] [place], [place] [address]   Surgical pcr screen     Status: None   Collection Time: 10/01/19  1:44 AM   Specimen: Nasal Mucosa; Nasal Swab  Result Value Ref Range Status   MRSA, PCR NEGATIVE NEGATIVE Final   Staphylococcus aureus NEGATIVE NEGATIVE Final    Comment: (NOTE) The Xpert SA Assay (FDA approved for NASAL specimens in patients 78 years of age and older), is one component of a comprehensive surveillance program. It is not intended to diagnose infection nor to guide or monitor treatment. Performed at [facility], [place] [address], [place], [place] [address]          Radiology Studies: No results found.    Scheduled Meds: . allopurinol  300 mg Oral Daily  . aspirin EC  81 mg Oral QHS  . Chlorhexidine Gluconate Cloth  6 each Topical Daily  . [START ON 10/04/2019] Chlorhexidine Gluconate Cloth  6 each Topical Q0600  . insulin aspart  0-9 Units Subcutaneous TID WC  . omega-3 acid ethyl esters  1 g Oral Daily  . pantoprazole  40 mg Oral BID  . rosuvastatin  5 mg Oral q1800  . sevelamer carbonate  800 mg Oral TID WC   Continuous Infusions: . dialysis solution 1.5% low-MG/low-CA       LOS: 2 days      Debbe Odea, MD [facility] Pager: [email or web address] Password [facility] 10/03/2019, 12:36 PM

## 2019-10-03 NOTE — Progress Notes (Signed)
[facility] Progress Note  Subjective: stable, had new TDC placed by IR this am, on HD now  Vitals:   10/03/19 1406 10/03/19 1425 10/03/19 1430 10/03/19 1435  BP: 113/63 (!) 107/58 (!) 120/59 (!) 109/54  Pulse: 89 82 84 84  Resp: 16 12 16 15   Temp:      TempSrc:      SpO2: 100% 100% 100% 100%  Weight:        Inpatient medications: . allopurinol  300 mg Oral Daily  . aspirin EC  81 mg Oral QHS  . Chlorhexidine Gluconate Cloth  6 each Topical Daily  . [START ON 10/04/2019] Chlorhexidine Gluconate Cloth  6 each Topical Q0600  . fentaNYL      . gelatin adsorbable      . gelatin adsorbable      . heparin      . insulin aspart  0-9 Units Subcutaneous TID WC  . lidocaine      . [START ON 10/04/2019] lidocaine  15 mL Mouth/Throat TID AC  . midazolam      . omega-3 acid ethyl esters  1 g Oral Daily  . pantoprazole  40 mg Oral BID  . rosuvastatin  5 mg Oral q1800  . sevelamer carbonate  800 mg Oral TID WC   . dialysis solution 1.5% low-MG/low-CA     acetaminophen **OR** acetaminophen, dianeal solution for CAPD/CCPD with heparin, ondansetron **OR** ondansetron (ZOFRAN) IV    Exam: Gen alert, disoriented, pleasant No jvd or bruits Chest clear bilat to bses RRR no MRG Abd soft ntnd LLQ PD cath +bs Ext no LE edema, no wounds or ulcers Neuro is alert, nonfocal    Home meds:  - allopurinol 300/ pantoprazole 40 bid  - rosuvastatin 5 qd/ Kdur 20 qd  - prn's/ vitamins/ supplements     Outpt PD: 5 exchanges, 2500 dwell, 2 hr each,  no day bag, edw 48.5kg  - started in 2017, last clearance here was 2.05 on 08/2019.       Assessment/ Plan: 1. AMS/ confusion - reportedly worsening over time but acutely the past 1-2 weeks.  Not doing her PD properly, confusion may be uremia in part. Talked w/ daughter, plan is trial of hemodialysis here, consult palliative care for GOC and see how she does.   2. ESRD - on PD x 3 yrs. As above, transition to HD trial for  now. HD today. No further PD.  3. Volume - no vol excess on exam, under dry wt 4. Anemia ckd - Hb 10.5, no need esa for now    Rob Nuri Branca 10/03/2019, 5:04 PM  Iron/TIBC/Ferritin/ %Sat    Component Value Date/Time   IRON 90 03/25/2019 1141   TIBC 215 (L) 03/25/2019 1141   FERRITIN 1,467 (H) 03/25/2019 1141   IRONPCTSAT 42 03/25/2019 1141   Recent Labs  Lab 09/30/19 1442  10/02/19 0530 10/03/19 0434  NA 135   < > 134* 135  K 4.5   < > 4.7 4.0  CL 95*   < > 94* 92*  CO2 26   < > 25 26  GLUCOSE 152*   < > 171* 168*  BUN 32*   < > 40* 40*  CREATININE 7.30*   < > 8.39* 7.97*  CALCIUM 10.6*   < > 10.4* 10.3  PHOS 2.2*  --   --   --   ALBUMIN 1.9*  --  1.7*  --   INR 0.9  --   --   --    < > =  values in this interval not displayed.   Recent Labs  Lab 10/02/19 0530  AST 28  ALT 15  ALKPHOS 134*  BILITOT 0.7  PROT 5.0*   Recent Labs  Lab 10/03/19 0434  WBC 9.0  HGB 9.5*  HCT 32.9*  PLT 301

## 2019-10-03 NOTE — Progress Notes (Signed)
Palliative:  HPI: 78 y.o. female  with past medical history of ESRD on PD (since 2017), anemia, GERD, gout, prediabetes, gastric carcinoid )on surveillance with GI), recent C. diff infection  admitted on 09/30/2019 with increased confusion of unknown etiology. Could be a uremia factor to confusion with plans for hemodialysis trial.   I met today with Eileen Perez and she continues to be alert and interactive. Today she is oriented person and place. She tells me that she is at [name] and did not know this yesterday. I asked her if any of her doctors have been by today or what they have told her. She cannot remember but asks if "Corliss Parish" was coming to see her and I told her that Dr. Shelva Majestic partner has been following with her and she is satisfied. I explained to her plan for HD catheter to be placed today for trial of HD. She does not seem pleased with plan for HD and seemed to become anxious upon hearing this plan. I reassured her that this is only a trial and that we plan to continue working with her and her family to come up with a plan. I encouraged her that we will just take one day at a time and this is the plan for today. She tells me "okay."  I had a conference call with her daughter, 2 sons, multiple siblings and multiple grandchildren. They had many questions/concerns that I answered to the best of my ability. One sibling was particularly concerned that she has peritonitis and I reassured them that this is on our radar but there are no indications or signs of peritonitis at this time. We discussed current plans for HD and reviewed medications and test results.   We also discussed concern that she will not be able to return to peritoneal dialysis (they are very concerned about this) and would like to reconsider PD if she improves. They indicate that hemodialysis is a hard stop for her but they would like to continue with aggressive care and HD for now with hopes of improvement. We  did discuss likely need for HD and how this could impact her QOL and likely increase care needs. Family understand but would like to follow her progress and continue to discuss. Hopefully she will be able to participate in decision making.   Family are also concerned as she has recently developed an issue with her hands becoming numb and "like an itch she cannot scratch" and she has associated anxiety with this. She can only obtain relief by trying to rub her hands on something hard and this usually lasts ~30-60 min. Family say she was scheduled to visit with neurologist for this condition. They request assistance from nursing as they do not want her to feel anxious and miserable.   Exam: Alert, oriented to person and place. No distress. Thin, frail. Muscle wasting. HR regular. Breathing regular, unlabored. Generalized weakness.   Plan: - Continue with trial of HD.  - Continue GOC conversation International aid/development worker, [name] to follow) with primary contact daughter, [name].   [place]err[place], NP Palliative Medicine Team Pager [phone] (Please see [email or web address] for schedule) Team Phone [phone]    Greater than 50%  of this time was spent counseling and coordinating care related to the above assessment and plan

## 2019-10-03 NOTE — Procedures (Signed)
Interventional Radiology Procedure Note  Procedure: Placement of a right IJ approach double lumen HD catheter, tunneled. Tip is positioned at the superior cavoatrial junction and catheter is ready for immediate use.  Complications: None Recommendations:  - Ok to use - Do not submerge - Routine line care   Signed,  Dulcy Fanny. Earleen Newport, DO

## 2019-10-03 NOTE — Progress Notes (Signed)
RN spoke with Pt. Daughter(Sherlynn) @ 2222.  Daughter gave verbal consent via phone, for pt to have HD catheter placed on 10/03/19.  Charge nurse was 2nd verifier to confirm verbal consent.  At this time, no OR orders.  Pt will be NPO after midnight.

## 2019-10-03 NOTE — Progress Notes (Signed)
Gave report to [place], RN on [facility].  Advise receiving nurse that pt went directly from HD cath placement to HD to [facility] so patient was not on the floor at 1700 and 1800 so these medications need to be given on her floor.

## 2019-10-03 NOTE — Progress Notes (Signed)
New Admission Note:   Arrival Method: Bed  Mental Orientation: Alert Telemetry: Box 20  Assessment: Completed Skin:  IV: right forearm  Pain: 0/10  Tubes: Safety Measures: Safety Fall Prevention Plan has been given, discussed and signed Admission: Completed [facility] Orientation: Patient has been orientated to the room, unit and staff.  Family: none   Orders have been reviewed and implemented. Will continue to monitor the patient. Call light has been placed within reach and bed alarm has been activated.   Zulay Corrie RN [place] [facility] Phone: [phone]

## 2019-10-04 DIAGNOSIS — I10 Essential (primary) hypertension: Secondary | ICD-10-CM

## 2019-10-04 LAB — GLUCOSE, CAPILLARY
Glucose-Capillary: 79 mg/dL (ref 70–99)
Glucose-Capillary: 81 mg/dL (ref 70–99)
Glucose-Capillary: 112 mg/dL — ABNORMAL HIGH (ref 70–99)
Glucose-Capillary: 107 mg/dL — ABNORMAL HIGH (ref 70–99)

## 2019-10-04 MED ORDER — RENA-VITE PO TABS
1.0000 | ORAL_TABLET | Freq: Every day | ORAL | Status: DC
  Administered 2019-10-04 – 2019-10-11 (×8): 1 via ORAL
  Filled 2019-10-04 (×8): qty 1

## 2019-10-04 MED ORDER — CINACALCET HCL 30 MG PO TABS
30.0000 mg | ORAL_TABLET | Freq: Every day | ORAL | Status: DC
  Administered 2019-10-05: 30 mg via ORAL
  Filled 2019-10-04: qty 1

## 2019-10-04 MED ORDER — PRO-STAT SUGAR FREE PO LIQD
30.0000 mL | Freq: Two times a day (BID) | ORAL | Status: DC
  Administered 2019-10-04 – 2019-10-06 (×5): 30 mL via ORAL
  Filled 2019-10-04 (×5): qty 30

## 2019-10-04 NOTE — Progress Notes (Signed)
PROGRESS NOTE    Donnis Hoheisel   MRN:[id number]  DOB: 12/07/1940  DOA: 09/30/2019 PCP: McKeown, William, MD   Brief Narrative:  Eileen Perez the patient is a 78 y.o. year-old who lives alone with ESRD on peritoneal dialysis, gastric carcinoid, gout, DM2   presenting to ED with confusion. She lives alone. Her grand daugther noted confusion over the weekend and her HHRN went to check on her on Tuesday and noted severe confusion. Her daughter Sherylynwho does not live with her tells me that a dialysis RN or a family member were helping with her dialysis. She was also eating poorly this past week.   In ED> head CT unrevealing, Ammonia level negative   Subjective: She has no complaints today.  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  - palliative care was consulted- the family would like the patient to make a decision when she is more oriented (after being dialyzed adequately)  Diarrhea - abdomen non-tender, no leukocytosis of fevers thus likely not infectious - d/c Nepro- diarrhea has improved    Anemia of chronic disease - stable  Gout - cont allopurinol  Recent C diff colitis - no evidence of recurrence- WBC 16 on 11/11 but improved today per RN  Weight loss - per daughter she lost about 13 lb but has recently gained some back  Hypoalbuminemia - ? If related to recent C diff infection and resultant poor oral intake  DM2 - last A1c 6.5 in 4/20- not on medications at home so I  suspect it is diet controlled  HLD Crestor   Carcinoid tumor of stomach   Time spent in minutes: 35  DVT prophylaxis: Heparin Code Status: Full code Family Communication:  Daughter [name] Disposition Plan: to be determined Consultants:   Nephrology  Palliative care  IR Procedures:   HD cath  Antimicrobials:  Anti-infectives (From admission, onward)   Start     Dose/Rate Route Frequency Ordered Stop   10/03/19 1403  ceFAZolin (ANCEF) 2-4 GM/100ML-% IVPB    Note to Pharmacy: Morgan, Michael   : cabinet override      10/03/19 1403 10/03/19 1407   10/03/19 0000  ceFAZolin (ANCEF) IVPB 2g/100 mL premix     2 g 200 mL/hr over 30 Minutes Intravenous  Once 10/02/19 1703 10/03/19 0249   10/02/19 1530  ceFAZolin (ANCEF) IVPB 2g/100 mL premix  Status:  Discontinued     2 g 200 mL/hr over 30 Minutes Intravenous  Once 10/02/19 1518 10/02/19 1703   10/01/19 0800  vancomycin (VANCOCIN) 50 mg/mL oral solution 125 mg  Status:  Discontinued     125 mg Oral 4 times daily 10/01/19 0631 10/01/19 1313       Objective: Vitals:   10/03/19 1856 10/03/19 2124 10/04/19 0500 10/04/19 1101  BP: (!) 100/53 (!) 101/59 114/68 120/63  Pulse:  88 83 84  Resp:  18 18 18  Temp: 98.3 F (36.8 C) 97.8 F (36.6   C) 98.8 F (37.1 C) (!) 97.3 F (36.3 C)  TempSrc: Oral Oral Oral Oral  SpO2: 99% 99% 100% 100%  Weight:        Intake/Output Summary (Last 24 hours) at 10/04/2019 1205 Last data filed at 10/04/2019 0600 Gross per 24 hour  Intake 60 ml  Output -629 ml  Net 689 ml   Filed Weights   10/03/19 0653 10/03/19 0915 10/03/19 1450  Weight: 45.6 kg 45.5 kg 45.3 kg    Examination: General exam: Appears comfortable  HEENT: PERRLA, oral mucosa moist, no sclera icterus or thrush- HR cath in left upper chest wall Respiratory system: Clear to auscultation. Respiratory effort normal. Cardiovascular system: S1 & S2 heard,  No murmurs  Gastrointestinal system: Abdomen soft, non-tender,  nondistended. Normal bowel sounds  - peritoneal cath in place Central nervous system: Alert and oriented to person and place today. No focal neurological deficits. Extremities: No cyanosis, clubbing or edema Skin: No rashes or ulcers Psychiatry:  Mood & affect appropriate.      Data Reviewed: I have personally reviewed following labs and imaging studies  CBC: Recent Labs  Lab 09/30/19 1442 10/01/19 0336 10/02/19 0530 10/03/19 0434  WBC 13.6* 16.3* 11.4* 9.0  NEUTROABS 11.6*  --   --   --   HGB 11.0* 10.5* 10.4* 9.5*  HCT 37.5 35.7* 35.7* 32.9*  MCV 101.9* 101.4* 102.3* 101.9*  PLT 293 297 255 932   Basic Metabolic Panel: Recent Labs  Lab 09/30/19 1442 10/01/19 0336 10/02/19 0530 10/03/19 0434  NA 135 135 134* 135  K 4.5 5.3* 4.7 4.0  CL 95* 95* 94* 92*  CO2 _0 GLUCOSE 152* 103* 171* 168*  BUN 32* 36* 40* 40*  CREATININE 7.30* 8.50* 8.39* 7.97*  CALCIUM 10.6* 10.6* 10.4* 10.3  MG 3.2*  --   --   --   PHOS 2.2*  --   --   --    GFR: Estimated Creatinine Clearance: 4.1 mL/min (A) (by C-G formula based on SCr of 7.97 mg/dL (H)). Liver Function Tests: Recent Labs  Lab 09/30/19 1442 10/02/19 0530  AST 16 28  ALT 14 15  ALKPHOS 141* 134*  BILITOT 0.3 0.7  PROT 5.4* 5.0*  ALBUMIN 1.9* 1.7*   No results for input(s): LIPASE, AMYLASE in the last 168 hours. Recent Labs  Lab 09/30/19 1529 10/02/19 0530  AMMONIA 16 24   Coagulation Profile: Recent Labs  Lab 09/30/19 1442  INR 0.9   Cardiac Enzymes: No results for input(s): CKTOTAL, CKMB, CKMBINDEX, TROPONINI in the last 168 hours. BNP (last 3 results) No results for input(s): PROBNP in the last 8760 hours. HbA1C: No results for input(s): HGBA1C in the last 72 hours. CBG: Recent Labs  Lab 10/03/19 0641 10/03/19 1216 10/03/19 1851 10/03/19 2131 10/04/19 0742  GLUCAP 162* 84 71 86 79   Lipid Profile: No results for input(s): CHOL, HDL, LDLCALC, TRIG, CHOLHDL, LDLDIRECT in the last 72  hours. Thyroid Function Tests: No results for input(s): TSH, T4TOTAL, FREET4, T3FREE, THYROIDAB in the last 72 hours. Anemia Panel: No results for input(s): VITAMINB12, FOLATE, FERRITIN, TIBC, IRON, RETICCTPCT in the last 72 hours. Urine analysis:    Component Value Date/Time   COLORURINE AMBER (A) 10/02/2019 1600   APPEARANCEUR CLOUDY (A) 10/02/2019 1600   LABSPEC 1.012 10/02/2019 1600   PHURINE 6.0 10/02/2019 1600   GLUCOSEU 50 (A) 10/02/2019 1600   HGBUR MODERATE (A) 10/02/2019 1600   BILIRUBINUR NEGATIVE 10/02/2019 1600  KETONESUR NEGATIVE 10/02/2019 1600   PROTEINUR >=300 (A) 10/02/2019 1600   UROBILINOGEN 0.2 05/19/2015 0942   NITRITE NEGATIVE 10/02/2019 1600   LEUKOCYTESUR LARGE (A) 10/02/2019 1600   Sepsis Labs: _0 (procalcitonin:4,lacticidven:4) ) Recent Results (from the past 240 hour(s))  SARS CORONAVIRUS 2 (TAT 6-24 HRS) Nasopharyngeal Nasopharyngeal Swab     Status: None   Collection Time: 09/30/19  7:10 PM   Specimen: Nasopharyngeal Swab  Result Value Ref Range Status   SARS Coronavirus 2 NEGATIVE NEGATIVE Final    Comment: (NOTE) SARS-CoV-2 target nucleic acids are NOT DETECTED. The SARS-CoV-2 RNA is generally detectable in upper and lower respiratory specimens during the acute phase of infection. Negative results do not preclude SARS-CoV-2 infection, do not rule out co-infections with other pathogens, and should not be used as the sole basis for treatment or other patient management decisions. Negative results must be combined with clinical observations, patient history, and epidemiological information. The expected result is Negative. Fact Sheet for Patients: SugarRoll.be Fact Sheet for Healthcare Providers: https://www.woods-mathews.com/ This test is not yet approved or cleared by the [place] FDA and  has been authorized for detection and/or diagnosis of SARS-CoV-2 by FDA under an Emergency Use  Authorization (EUA). This EUA will remain  in effect (meaning this test can be used) for the duration of the COVID-19 declaration under Section 56 4(b)(1) of the Act, 21 U.S.C. section 360bbb-3(b)(1), unless the authorization is terminated or revoked sooner. Performed at [facility], [place] [address], [place], [place] [address]   Surgical pcr screen     Status: None   Collection Time: 10/01/19  1:44 AM   Specimen: Nasal Mucosa; Nasal Swab  Result Value Ref Range Status   MRSA, PCR NEGATIVE NEGATIVE Final   Staphylococcus aureus NEGATIVE NEGATIVE Final    Comment: (NOTE) The Xpert SA Assay (FDA approved for NASAL specimens in patients 67 years of age and older), is one component of a comprehensive surveillance program. It is not intended to diagnose infection nor to guide or monitor treatment. Performed at [facility], [place] [address] [place], [place] [address]          Radiology Studies: Ir Cyndy Freeze Guide Cv Line Right  Result Date: 10/03/2019 INDICATION: 78 year old female with a history of peritoneal hemodialysis EXAM: TUNNELED CENTRAL VENOUS HEMODIALYSIS CATHETER PLACEMENT WITH ULTRASOUND AND FLUOROSCOPIC GUIDANCE MEDICATIONS: 2 g Ancef. The antibiotic was given in an appropriate time interval prior to skin puncture. ANESTHESIA/SEDATION: Moderate (conscious) sedation was employed during this procedure. A total of Versed 1.0 mg and Fentanyl 25 mcg was administered intravenously. Moderate Sedation Time: 13 minutes. The patient's level of consciousness and vital signs were monitored continuously by radiology nursing throughout the procedure under my direct supervision. FLUOROSCOPY TIME:  Fluoroscopy Time: 0 minutes 6 seconds (1 mGy). COMPLICATIONS: None PROCEDURE: Informed written consent was obtained from the patient after a discussion of the risks, benefits, and alternatives to treatment. Questions regarding the procedure were encouraged and answered. The right neck and  chest were prepped with chlorhexidine in a sterile fashion, and a sterile drape was applied covering the operative field. Maximum barrier sterile technique with sterile gowns and gloves were used for the procedure. A timeout was performed prior to the initiation of the procedure. After creating a small venotomy incision, a micropuncture kit was utilized to access the right internal jugular vein under direct, real-time ultrasound guidance after the overlying soft tissues were anesthetized with 1% lidocaine with epinephrine. Ultrasound image documentation was performed.  The microwire was marked to measure appropriate internal catheter length. External tunneled length was estimated. A total tip to cuff length of 19 cm was selected. Skin and subcutaneous tissues of chest wall below the clavicle were generously infiltrated with 1% lidocaine for local anesthesia. A small stab incision was made with 11 blade scalpel. The selected hemodialysis catheter was tunneled in a retrograde fashion from the anterior chest wall to the venotomy incision. A guidewire was advanced to the level of the IVC and the micropuncture sheath was exchanged for a peel-away sheath. The catheter was then placed through the peel-away sheath with tips ultimately positioned within the superior aspect of the right atrium. Final catheter positioning was confirmed and documented with a spot radiographic image. The catheter aspirates and flushes normally. The catheter was flushed with appropriate volume heparin dwells. The catheter exit site was secured with a 0-Prolene retention suture. The venotomy incision was closed Derma bond and sterile dressing. Dressings were applied at the chest wall. Patient tolerated the procedure well and remained hemodynamically stable throughout. No complications were encountered and no significant blood loss encountered. IMPRESSION: Status post right IJ tunneled hemodialysis catheter placement. Catheter ready for use. Signed,  Jaime S. Wagner, DO, RPVI [facility] Electronically Signed   By: Jaime  Wagner D.O.   On: 10/03/2019 15:31   Ir Us Guide Vasc Access Right  Result Date: 10/03/2019 INDICATION: 78-year-old female with a history of peritoneal hemodialysis EXAM: TUNNELED CENTRAL VENOUS HEMODIALYSIS CATHETER PLACEMENT WITH ULTRASOUND AND FLUOROSCOPIC GUIDANCE MEDICATIONS: 2 g Ancef. The antibiotic was given in an appropriate time interval prior to skin puncture. ANESTHESIA/SEDATION: Moderate (conscious) sedation was employed during this procedure. A total of Versed 1.0 mg and Fentanyl 25 mcg was administered intravenously. Moderate Sedation Time: 13 minutes. The patient's level of consciousness and vital signs were monitored continuously by radiology nursing throughout the procedure under my direct supervision. FLUOROSCOPY TIME:  Fluoroscopy Time: 0 minutes 6 seconds (1 mGy). COMPLICATIONS: None PROCEDURE: Informed written consent was obtained from the patient after a discussion of the risks, benefits, and alternatives to treatment. Questions regarding the procedure were encouraged and answered. The right neck and chest were prepped with chlorhexidine in a sterile fashion, and a sterile drape was applied covering the operative field. Maximum barrier sterile technique with sterile gowns and gloves were used for the procedure. A timeout was performed prior to the initiation of the procedure. After creating a small venotomy incision, a micropuncture kit was utilized to access the right internal jugular vein under direct, real-time ultrasound guidance after the overlying soft tissues were anesthetized with 1% lidocaine with epinephrine. Ultrasound image documentation was performed. The microwire was marked to measure appropriate internal catheter length. External tunneled length was estimated. A total tip to cuff length of 19 cm was selected. Skin and subcutaneous tissues of  chest wall below the clavicle were generously infiltrated with 1% lidocaine for local anesthesia. A small stab incision was made with 11 blade scalpel. The selected hemodialysis catheter was tunneled in a retrograde fashion from the anterior chest wall to the venotomy incision. A guidewire was advanced to the level of the IVC and the micropuncture sheath was exchanged for a peel-away sheath. The catheter was then placed through the peel-away sheath with tips ultimately positioned within the superior aspect of the right atrium. Final catheter positioning was confirmed and documented with a spot radiographic image. The catheter aspirates and flushes normally. The catheter was flushed with appropriate volume heparin   dwells. The catheter exit site was secured with a 0-Prolene retention suture. The venotomy incision was closed Derma bond and sterile dressing. Dressings were applied at the chest wall. Patient tolerated the procedure well and remained hemodynamically stable throughout. No complications were encountered and no significant blood loss encountered. IMPRESSION: Status post right IJ tunneled hemodialysis catheter placement. Catheter ready for use. Signed, Dulcy Fanny. Dellia Nims, RPVI [facility] Electronically Signed   By: Corrie Mckusick D.O.   On: 10/03/2019 15:31      Scheduled Meds: . allopurinol  300 mg Oral Daily  . aspirin EC  81 mg Oral QHS  . Chlorhexidine Gluconate Cloth  6 each Topical Daily  . Chlorhexidine Gluconate Cloth  6 each Topical Q0600  . insulin aspart  0-9 Units Subcutaneous TID WC  . lidocaine  15 mL Mouth/Throat TID AC  . omega-3 acid ethyl esters  1 g Oral Daily  . pantoprazole  40 mg Oral BID  . rosuvastatin  5 mg Oral q1800  . sevelamer carbonate  800 mg Oral TID WC   Continuous Infusions: . dialysis solution 1.5% low-MG/low-CA       LOS: 3 days      Debbe Odea, MD [facility] Pager:  [email or web address] Password [email or web address] 10/04/2019, 12:05 PM

## 2019-10-04 NOTE — Progress Notes (Addendum)
Subjective:  No cos, resting in BED, does not remember HD yesterday  Or even Placement of Perm cath until asking a second time about perm cath   Objective Vital signs in last 24 hours: Vitals:   10/03/19 1856 10/03/19 2124 10/04/19 0500 10/04/19 1101  BP: (!) 100/53 (!) 101/59 114/68 120/63  Pulse:  88 83 84  Resp:  18 18 18   Temp: 98.3 F (36.8 C) 97.8 F (36.6 C) 98.8 F (37.1 C) (!) 97.3 F (36.3 C)  TempSrc: Oral Oral Oral Oral  SpO2: 99% 99% 100% 100%  Weight:       Weight change: -0.2 kg  Physical Exam:  General= alert, soft spoke elderly female pleasantly disoriented,  NAD  Chest =clear bilatto bses Card= RRR no MRG Abd = soft ntnd LLQ PD cath+bs Ext= no LEedema/  R IJ Perm cath  Neuro =  alert, Oriented to place only, not sure of month or day of week, , "2020", "Biden now President "  otherwise  non focal   Home meds: - allopurinol 300/ pantoprazole 40 bid - rosuvastatin 5 qd/ Kdur 20 qd - prn's/ vitamins/ supplements  OutptPD: 5 exchanges, 2500 dwell, 2 hr each, no day bag, edw 48.5kg - started in 2017, last clearance here was 2.05 on 08/2019.   Problem/Plan: 1. AMS/ confusion - reportedly worsening over time but acutely the past 1-2 weeks. Not doing her PD properly, confusion Combination  may be uremia / advancing Dementia  Dr Jonnie Finner  Talked w/ daughter, plan of  trial  hemodialysis here, consult palliative care for [place] and see how she does.   2. ESRD - on PD x 3 yrs. As above, transition to HD trial for now. HD yesterday . No further PD. /  3. Volume - no vol excess on exam, under dry wt 4. Anemia ckd - Hb 10.5, no need esa for now 5. Secondary Hyperparathyroidism - phos 2.2  Ca 10.3  corec = 12.1  Use low ca bath .Continue sensipar 30mg   DC Renvela, No vit  D  Fu ca/ phos trend  6. DMT2 - per admit  7. Hypoalbuminemia- ?sec  Combination PD , decreased appetite with uremia  / prostat  Supplement  , hold nepro  With recent diarrhea   [name], PA-C [facility] [phone] 10/04/2019,11:19 AM  LOS: 3 days   Labs: Basic Metabolic Panel: Recent Labs  Lab 09/30/19 1442 10/01/19 0336 10/02/19 0530 10/03/19 0434  NA 135 135 134* 135  K 4.5 5.3* 4.7 4.0  CL 95* 95* 94* 92*  CO2 26 27 25 26   GLUCOSE 152* 103* 171* 168*  BUN 32* 36* 40* 40*  CREATININE 7.30* 8.50* 8.39* 7.97*  CALCIUM 10.6* 10.6* 10.4* 10.3  PHOS 2.2*  --   --   --    Liver Function Tests: Recent Labs  Lab 09/30/19 1442 10/02/19 0530  AST 16 28  ALT 14 15  ALKPHOS 141* 134*  BILITOT 0.3 0.7  PROT 5.4* 5.0*  ALBUMIN 1.9* 1.7*   No results for input(s): LIPASE, AMYLASE in the last 168 hours. Recent Labs  Lab 09/30/19 1529 10/02/19 0530  AMMONIA 16 24   CBC: Recent Labs  Lab 09/30/19 1442 10/01/19 0336 10/02/19 0530 10/03/19 0434  WBC 13.6* 16.3* 11.4* 9.0  NEUTROABS 11.6*  --   --   --   HGB 11.0* 10.5* 10.4* 9.5*  HCT 37.5 35.7* 35.7* 32.9*  MCV 101.9* 101.4* 102.3* 101.9*  PLT 293  297 255 301   Cardiac Enzymes: No results for input(s): CKTOTAL, CKMB, CKMBINDEX, TROPONINI in the last 168 hours. CBG: Recent Labs  Lab 10/03/19 0641 10/03/19 1216 10/03/19 1851 10/03/19 2131 10/04/19 0742  GLUCAP 162* 84 71 86 79     Medications: . dialysis solution 1.5% low-MG/low-CA     . allopurinol  300 mg Oral Daily  . aspirin EC  81 mg Oral QHS  . Chlorhexidine Gluconate Cloth  6 each Topical Daily  . Chlorhexidine Gluconate Cloth  6 each Topical Q0600  . insulin aspart  0-9 Units Subcutaneous TID WC  . lidocaine  15 mL Mouth/Throat TID AC  . omega-3 acid ethyl esters  1 g Oral Daily  . pantoprazole  40 mg Oral BID  . rosuvastatin  5 mg Oral q1800  . sevelamer carbonate  800 mg Oral TID WC

## 2019-10-05 DIAGNOSIS — Z7189 Other specified counseling: Secondary | ICD-10-CM

## 2019-10-05 LAB — BASIC METABOLIC PANEL
Anion gap: 12 (ref 5–15)
BUN: 34 mg/dL — ABNORMAL HIGH (ref 8–23)
CO2: 26 mmol/L (ref 22–32)
Calcium: 10.3 mg/dL (ref 8.9–10.3)
Chloride: 100 mmol/L (ref 98–111)
Creatinine, Ser: 6.2 mg/dL — ABNORMAL HIGH (ref 0.44–1.00)
GFR calc Af Amer: 7 mL/min — ABNORMAL LOW (ref >60)
GFR calc non Af Amer: 6 mL/min — ABNORMAL LOW (ref >60)
Glucose, Bld: 119 mg/dL — ABNORMAL HIGH (ref 70–99)
Potassium: 4 mmol/L (ref 3.5–5.1)
Sodium: 138 mmol/L (ref 135–145)

## 2019-10-05 LAB — GLUCOSE, CAPILLARY
Glucose-Capillary: 113 mg/dL — ABNORMAL HIGH (ref 70–99)
Glucose-Capillary: 152 mg/dL — ABNORMAL HIGH (ref 70–99)
Glucose-Capillary: 149 mg/dL — ABNORMAL HIGH (ref 70–99)
Glucose-Capillary: 152 mg/dL — ABNORMAL HIGH (ref 70–99)

## 2019-10-05 MED ORDER — DARBEPOETIN ALFA 40 MCG/0.4ML IJ SOSY
40.0000 ug | PREFILLED_SYRINGE | INTRAMUSCULAR | Status: DC
  Administered 2019-10-06: 10:00:00 40 ug via INTRAVENOUS
  Filled 2019-10-05: qty 0.4

## 2019-10-05 MED ORDER — SODIUM CHLORIDE 0.9 % IV SOLN
125.0000 mg | INTRAVENOUS | Status: AC
  Administered 2019-10-06 – 2019-10-08 (×2): 125 mg via INTRAVENOUS
  Filled 2019-10-05 (×3): qty 10

## 2019-10-05 NOTE — Progress Notes (Signed)
Occupational Therapy Treatment Patient Details Name: Eileen Perez MRN: [id number] DOB: 31-May-1941 Today's Date: 10/05/2019    History of present illness 78 y.o. 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 Plan for dialysis catheter 11/13.   OT comments  Pt received in bed, awake and alert, agreeable of session. Session involved sink level ADL (Min guard) for safety in standing with RW, chair provided for safety if fatigued. Pt required assistance to manage toothpaste due to decreased grip strength. Pt observed to have bowel incontinence during session and unaware. Session limited due to clean up. Pt does still demonstrate some confusion. Continue POC to address established goals.     Follow Up Recommendations  SNF(unless family continues to provide 24/7; if so, HHOT)    Equipment Recommendations  None recommended by OT    Recommendations for Other Services      Precautions / Restrictions Precautions Precautions: Fall Restrictions Weight Bearing Restrictions: No       Mobility Bed Mobility Overal bed mobility: Needs Assistance Bed Mobility: Supine to Sit     Supine to sit: Supervision;HOB elevated Sit to supine: Supervision   General bed mobility comments: Supervision for safety.  Transfers Overall transfer level: Needs assistance Equipment used: Rolling walker (2 wheeled) Transfers: Sit to/from Stand Sit to Stand: Min guard Stand pivot transfers: Min guard       General transfer comment: min guard with extra time, transferred to sink for ADL, chair provided for safety if pt became fatigued.    Balance Overall balance assessment: Needs assistance Sitting-balance support: Bilateral upper extremity  supported;Feet supported Sitting balance-Leahy Scale: Good     Standing balance support: During functional activity Standing balance-Leahy Scale: Fair Standing balance comment: UE support for balance                           ADL either performed or assessed with clinical judgement   ADL Overall ADL's : Needs assistance/impaired     Grooming: Min guard;Standing;Oral care Grooming Details (indicate cue type and reason): Pt requires assist to manage opening tooth paste due to decreased fine motor grasp.                 Toilet Transfer: Minimal assistance;RW;Ambulation(chair) Armed forces technical officer Details (indicate cue type and reason): simulated toilet transfer from bed <> bed for safety. Toileting- Clothing Manipulation and Hygiene: Minimal assistance;Sit to/from stand Toileting - Clothing Manipulation Details (indicate cue type and reason): Pt observed to have bowel incontinence while standing at for sink level ADL. pt reports being unaware.      Functional mobility during ADLs: Min guard;Minimal assistance       Vision       Perception     Praxis      Cognition Arousal/Alertness: Awake/alert Behavior During Therapy: Flat affect Overall Cognitive Status: Impaired/Different from baseline Area of Impairment: Orientation;Memory;Safety/judgement;Attention                 Orientation Level: Disoriented to;Place;Time;Situation Current Attention Level: Sustained Memory: Decreased short-term memory Following Commands: Follows one step commands consistently Safety/Judgement: Decreased awareness of safety;Decreased awareness of deficits Awareness: Intellectual Problem Solving: Slow processing;Requires verbal cues General Comments: Remains flat.        Exercises     Shoulder Instructions  General Comments      Pertinent Vitals/ Pain       Pain Assessment: No/denies pain Faces Pain Scale: No hurt Pain Location: Generalized Pain Descriptors /  Indicators: Discomfort Pain Intervention(s): Monitored during session  Home Living                                          Prior Functioning/Environment              Frequency  Min 2X/week        Progress Toward Goals  OT Goals(current goals can now be found in the care plan section)  Progress towards OT goals: Progressing toward goals  Acute Rehab OT Goals Patient Stated Goal: non stated OT Goal Formulation: Patient unable to participate in goal setting Time For Goal Achievement: 10/15/19 Potential to Achieve Goals: Good ADL Goals Pt Will Perform Grooming: with supervision;standing Pt Will Transfer to Toilet: with min guard assist;ambulating;bedside commode Pt Will Perform Toileting - Clothing Manipulation and hygiene: with min guard assist;sit to/from stand Additional ADL Goal #1: pt will not need more than 2 safety cues per session during adls/bathroom mobility  Plan Discharge plan remains appropriate;Frequency remains appropriate    Co-evaluation                 AM-PAC OT "6 Clicks" Daily Activity     Outcome Measure   Help from another person eating meals?: None Help from another person taking care of personal grooming?: A Little Help from another person toileting, which includes using toliet, bedpan, or urinal?: A Little Help from another person bathing (including washing, rinsing, drying)?: A Lot Help from another person to put on and taking off regular upper body clothing?: A Little Help from another person to put on and taking off regular lower body clothing?: A Lot 6 Click Score: 17    End of Session Equipment Utilized During Treatment: Gait belt;Rolling walker  OT Visit Diagnosis: Muscle weakness (generalized) (M62.81);Cognitive communication deficit (R41.841)   Activity Tolerance Patient tolerated treatment well   Patient Left with call bell/phone within reach;in bed;with bed alarm set   Nurse Communication Mobility  status(informed of bowel incontinence)        Time: 6144-3154 OT Time Calculation (min): 25 min  Charges: OT General Charges $OT Visit: 1 Visit OT Treatments $Self Care/Home Management : 8-22 mins  Minus Breeding, MSOT, OTR/L  Supplemental Rehabilitation Services  [phone]    Marius Ditch 10/05/2019, 3:14 PM

## 2019-10-05 NOTE — Progress Notes (Addendum)
Subjective:  noc os ,and no change in Pleasant confusion   Objective Vital signs in last 24 hours: Vitals:   10/04/19 1101 10/04/19 1805 10/04/19 2056 10/05/19 0421  BP: 120/63 (!) 105/58 107/67 128/70  Pulse: 84 89 71 89  Resp: _0 Temp: (!) 97.3 F (36.3 C) 98.2 F (36.8 C) 98.1 F (36.7 C) 98.6 F (37 C)  TempSrc: Oral Oral Oral Oral  SpO2: 100% 99% 99% 100%  Weight:    44.9 kg   Weight change: -0.6 kg  PPhysical Exam:  General= alert, soft spoke elderly female pleasantly disoriented,  NAD  Chest =clear bilatto bses Card= RRR no MRG Abd = soft ntnd LLQ PD cath+bs Ext= no LEedema/  R IJ Perm cath  Neuro =  alert, Oriented to place only, still not sure of month or day of week,   otherwise  non focal // Per RN Albert needed [employer]  assistance to ambulated to bathroom"   Home meds: - allopurinol 300/ pantoprazole 40 bid - rosuvastatin 5 qd/ Kdur 20 qd - prn's/ vitamins/ supplements  OutptPD: 5 exchanges, 2500 dwell, 2 hr each, no day bag, edw 48.5kg - started in 2017, last clearance here was 2.05 on 08/2019.   Problem/Plan: 1. AMS/ confusion - reportedly worseningover time butacutely the past 1-2 weeks. Not doing her PD properly, confusion Combination  may be uremia / advancing Dementia  Dr Jonnie Finner Talked w/ daughter,plan of trial  hemodialysis here, consult palliative care for [facility] see how she does.Next HD Monday, needs clip for op hd  2. ESRD - Was on PD x 3 yrs. As above, transition to HD trial for now.Scr 8.5 to 6.20 this am , K 4.0  After HD X1  3. Volume/BP- no vol excess on exam, under dry wt/ bp stable without meds  4. Anemia ckd - Hb 10.5>9.5 / Start Aranesp 11/16 hd  40 mcg  q wk hd  op iron studies =09/23/19 22% Ferritin 1155  Start venofer x 3  5. Secondary Hyperparathyroidism - phos 2.2  Ca 10.3  corec = 12.1  Use low ca bath  sensipar 47m  q day, DC Renvela, No vit  D  Fu ca/ phos trend // Noted op records cannot tolerate  sensipar  Will dc / PTH 242  09/23/19  6. DMT2 - per admit  7. Hypoalbuminemia- ?sec  Combination PD , decreased appetite with uremia  / prostat  Supplement  , hold nepro  With recent diarrhea   DErnest Haber PA-C [place]/15/2020,9:43 AM  LOS: 4 days   Pt seen, examined and agree w A/P as above.  RKelly Splinter MD 10/05/2019, 12:16 PM    Labs: Basic Metabolic Panel: Recent Labs  Lab 09/30/19 1442  10/02/19 0530 10/03/19 0434 10/05/19 0741  NA 135   < > 134* 135 138  K 4.5   < > 4.7 4.0 4.0  CL 95*   < > 94* 92* 100  CO2 26   < > _1 GLUCOSE 152*   < > 171* 168* 119*  BUN 32*   < > 40* 40* 34*  CREATININE 7.30*   < > 8.39* 7.97* 6.20*  CALCIUM 10.6*   < > 10.4* 10.3 10.3  PHOS 2.2*  --   --   --   --    < > = values in this interval not displayed.   Liver Function Tests: Recent Labs  Lab 09/30/19  1442 10/02/19 0530  AST 16 28  ALT 14 15  ALKPHOS 141* 134*  BILITOT 0.3 0.7  PROT 5.4* 5.0*  ALBUMIN 1.9* 1.7*   No results for input(s): LIPASE, AMYLASE in the last 168 hours. Recent Labs  Lab 09/30/19 1529 10/02/19 0530  AMMONIA 16 24   CBC: Recent Labs  Lab 09/30/19 1442 10/01/19 0336 10/02/19 0530 10/03/19 0434  WBC 13.6* 16.3* 11.4* 9.0  NEUTROABS 11.6*  --   --   --   HGB 11.0* 10.5* 10.4* 9.5*  HCT 37.5 35.7* 35.7* 32.9*  MCV 101.9* 101.4* 102.3* 101.9*  PLT 293 297 255 301   Cardiac Enzymes: No results for input(s): CKTOTAL, CKMB, CKMBINDEX, TROPONINI in the last 168 hours. CBG: Recent Labs  Lab 10/04/19 0742 10/04/19 1204 10/04/19 1729 10/04/19 2057 10/05/19 0700  GLUCAP 79 81 112* 107* 113*    Studies/Results: Ir Fluoro Guide Cv Line Right  Result Date: 10/03/2019 INDICATION: 78 year old female with a history of peritoneal hemodialysis EXAM: TUNNELED CENTRAL VENOUS HEMODIALYSIS CATHETER PLACEMENT WITH ULTRASOUND AND FLUOROSCOPIC GUIDANCE MEDICATIONS: 2 g Ancef. The antibiotic was given in  an appropriate time interval prior to skin puncture. ANESTHESIA/SEDATION: Moderate (conscious) sedation was employed during this procedure. A total of Versed 1.0 mg and Fentanyl 25 mcg was administered intravenously. Moderate Sedation Time: 13 minutes. The patient's level of consciousness and vital signs were monitored continuously by radiology nursing throughout the procedure under my direct supervision. FLUOROSCOPY TIME:  Fluoroscopy Time: 0 minutes 6 seconds (1 mGy). COMPLICATIONS: None PROCEDURE: Informed written consent was obtained from the patient after a discussion of the risks, benefits, and alternatives to treatment. Questions regarding the procedure were encouraged and answered. The right neck and chest were prepped with chlorhexidine in a sterile fashion, and a sterile drape was applied covering the operative field. Maximum barrier sterile technique with sterile gowns and gloves were used for the procedure. A timeout was performed prior to the initiation of the procedure. After creating a small venotomy incision, a micropuncture kit was utilized to access the right internal jugular vein under direct, real-time ultrasound guidance after the overlying soft tissues were anesthetized with 1% lidocaine with epinephrine. Ultrasound image documentation was performed. The microwire was marked to measure appropriate internal catheter length. External tunneled length was estimated. A total tip to cuff length of 19 cm was selected. Skin and subcutaneous tissues of chest wall below the clavicle were generously infiltrated with 1% lidocaine for local anesthesia. A small stab incision was made with 11 blade scalpel. The selected hemodialysis catheter was tunneled in a retrograde fashion from the anterior chest wall to the venotomy incision. A guidewire was advanced to the level of the IVC and the micropuncture sheath was exchanged for a peel-away sheath. The catheter was then placed through the peel-away sheath with  tips ultimately positioned within the superior aspect of the right atrium. Final catheter positioning was confirmed and documented with a spot radiographic image. The catheter aspirates and flushes normally. The catheter was flushed with appropriate volume heparin dwells. The catheter exit site was secured with a 0-Prolene retention suture. The venotomy incision was closed Derma bond and sterile dressing. Dressings were applied at the chest wall. Patient tolerated the procedure well and remained hemodynamically stable throughout. No complications were encountered and no significant blood loss encountered. IMPRESSION: Status post right IJ tunneled hemodialysis catheter placement. Catheter ready for use. Signed, Dulcy Fanny. Dellia Nims, RPVI [facility] Radiology Electronically Signed   By:  Corrie Mckusick D.O.   On: 10/03/2019 15:31   Ir US Guide Vasc Access Right  Result Date: 10/03/2019 INDICATION: 78 year old female with a history of peritoneal hemodialysis EXAM: TUNNELED CENTRAL VENOUS HEMODIALYSIS CATHETER PLACEMENT WITH ULTRASOUND AND FLUOROSCOPIC GUIDANCE MEDICATIONS: 2 g Ancef. The antibiotic was given in an appropriate time interval prior to skin puncture. ANESTHESIA/SEDATION: Moderate (conscious) sedation was employed during this procedure. A total of Versed 1.0 mg and Fentanyl 25 mcg was administered intravenously. Moderate Sedation Time: 13 minutes. The patient's level of consciousness and vital signs were monitored continuously by radiology nursing throughout the procedure under my direct supervision. FLUOROSCOPY TIME:  Fluoroscopy Time: 0 minutes 6 seconds (1 mGy). COMPLICATIONS: None PROCEDURE: Informed written consent was obtained from the patient after a discussion of the risks, benefits, and alternatives to treatment. Questions regarding the procedure were encouraged and answered. The right neck and chest were prepped with chlorhexidine in a sterile  fashion, and a sterile drape was applied covering the operative field. Maximum barrier sterile technique with sterile gowns and gloves were used for the procedure. A timeout was performed prior to the initiation of the procedure. After creating a small venotomy incision, a micropuncture kit was utilized to access the right internal jugular vein under direct, real-time ultrasound guidance after the overlying soft tissues were anesthetized with 1% lidocaine with epinephrine. Ultrasound image documentation was performed. The microwire was marked to measure appropriate internal catheter length. External tunneled length was estimated. A total tip to cuff length of 19 cm was selected. Skin and subcutaneous tissues of chest wall below the clavicle were generously infiltrated with 1% lidocaine for local anesthesia. A small stab incision was made with 11 blade scalpel. The selected hemodialysis catheter was tunneled in a retrograde fashion from the anterior chest wall to the venotomy incision. A guidewire was advanced to the level of the IVC and the micropuncture sheath was exchanged for a peel-away sheath. The catheter was then placed through the peel-away sheath with tips ultimately positioned within the superior aspect of the right atrium. Final catheter positioning was confirmed and documented with a spot radiographic image. The catheter aspirates and flushes normally. The catheter was flushed with appropriate volume heparin dwells. The catheter exit site was secured with a 0-Prolene retention suture. The venotomy incision was closed Derma bond and sterile dressing. Dressings were applied at the chest wall. Patient tolerated the procedure well and remained hemodynamically stable throughout. No complications were encountered and no significant blood loss encountered. IMPRESSION: Status post right IJ tunneled hemodialysis catheter placement. Catheter ready for use. Signed, Dulcy Fanny. Dellia Nims, RPVI [facility]  [facility] Radiology [facility] Radiology Electronically Signed   By: Corrie Mckusick D.O.   On: 10/03/2019 15:31   Medications: . dialysis solution 1.5% low-MG/low-CA     . allopurinol  300 mg Oral Daily  . aspirin EC  81 mg Oral QHS  . Chlorhexidine Gluconate Cloth  6 each Topical Daily  . Chlorhexidine Gluconate Cloth  6 each Topical Q0600  . cinacalcet  30 mg Oral Q breakfast  . feeding supplement (PRO-STAT SUGAR FREE 64)  30 mL Oral BID  . insulin aspart  0-9 Units Subcutaneous TID WC  . lidocaine  15 mL Mouth/Throat TID AC  . multivitamin  1 tablet Oral QHS  . omega-3 acid ethyl esters  1 g Oral Daily  . pantoprazole  40 mg Oral BID  . rosuvastatin  5 mg Oral q1800

## 2019-10-05 NOTE — Progress Notes (Addendum)
PROGRESS NOTE    Eileen Perez   ZOX:[id number]  DOB: 1941-01-15  DOA: 09/30/2019 PCP: Eileen Pinto, MD   Brief Narrative:  Eileen Perez the patient is a 78 y.o. year-old who lives alone with ESRD on peritoneal dialysis, gastric carcinoid, gout, DM2   presenting to ED with confusion. She lives alone. Her grand daugther noted confusion over the weekend and her [name] went to check on her on Tuesday and noted severe confusion. Her daughter Eileen Perez does not live with her tells me that a dialysis RN or a family member were helping with her dialysis. She was also eating poorly this past week.   In ED> head CT unrevealing, Ammonia level negative   Subjective: She has no complaints today. She thinks she was not in the hospital yesterday.   Assessment & Plan:   Principal Problem:   Acute metabolic encephalopathy -per daughter,  the patient was sharp enough to do her own dialysis at home but has been confused about 1 wk prior to admission -  thought to be due to inability to do dialysis properly and subsequent uremia - she remains confused but I feel she is improving with dialysis-  - B12 is 778 , ammonia 16-     Active Problems:     ESRD (end stage renal disease) on peritoneal dialysis since 2017 - appreciate nephrology assisting with dialysis- HD started 11/13 and will be dialyzed again today.  - palliative care was consulted- the family would like the patient to make a decision when she is more oriented (after being dialyzed adequately)  Diarrhea  - abdomen non-tender, no leukocytosis of fevers thus likely not infectious - d/c Nepro- diarrhea has improved    Anemia of chronic disease - stable  Gout - cont allopurinol  Recent C diff colitis - no evidence of recurrence- WBC 16 on 11/11 but improved today per RN  Weight loss - per daughter she lost about 13 lb but has recently gained some back  Hypoalbuminemia - ? If related to recent C diff infection and resultant  poor oral intake  DM2 - last A1c 6.5 in 4/20- not on medications at home so I suspect it is diet controlled- sugars have been quite stable and thus will d/c his fingerstick's.  HLD Crestor   Carcinoid tumor of stomach   Time spent in minutes: 35  DVT prophylaxis: Heparin Code Status: Full code Family Communication:  Daughter Eileen Perez Disposition Plan: to be determined Consultants:   Nephrology  Palliative care  IR Procedures:   HD cath  Antimicrobials:  Anti-infectives (From admission, onward)   Start     Dose/Rate Route Frequency Ordered Stop   10/03/19 1403  ceFAZolin (ANCEF) 2-4 GM/100ML-% IVPB    Note to Pharmacy: Eileen Perez   : cabinet override      10/03/19 1403 10/03/19 1407   10/03/19 0000  ceFAZolin (ANCEF) IVPB 2g/100 mL premix     2 g 200 mL/hr over 30 Minutes Intravenous  Once 10/02/19 1703 10/03/19 0249   10/02/19 1530  ceFAZolin (ANCEF) IVPB 2g/100 mL premix  Status:  Discontinued     2 g 200 mL/hr over 30 Minutes Intravenous  Once 10/02/19 1518 10/02/19 1703   10/01/19 0800  vancomycin (VANCOCIN) 50 mg/mL oral solution 125 mg  Status:  Discontinued     125 mg Oral 4 times daily 10/01/19 0631 10/01/19 1313       Objective: Vitals:   10/04/19 1101 10/04/19 1805 10/04/19 2056 10/05/19 0421  BP:  120/63 (!) 105/58 107/67 128/70  Pulse: 84 89 71 89  Resp: _0 Temp: (!) 97.3 F (36.3 C) 98.2 F (36.8 C) 98.1 F (36.7 C) 98.6 F (37 C)  TempSrc: Oral Oral Oral Oral  SpO2: 100% 99% 99% 100%  Weight:    44.9 kg    Intake/Output Summary (Last 24 hours) at 10/05/2019 0940 Last data filed at 10/05/2019 0600 Gross per 24 hour  Intake 460 ml  Output 0 ml  Net 460 ml   Filed Weights   10/03/19 0915 10/03/19 1450 10/05/19 0421  Weight: 45.5 kg 45.3 kg 44.9 kg    Examination: General exam: Appears comfortable  HEENT: PERRLA, oral mucosa moist, no sclera icterus or thrush Respiratory system: Clear to auscultation. Respiratory effort  normal. Cardiovascular system: S1 & S2 heard,  No murmurs  Gastrointestinal system: Abdomen soft, non-tender, nondistended. Normal bowel sounds   Central nervous system: Alert and oriented to person and place today. Still poorly oriented to situation and thinks the month is February. No focal neurological deficits. Extremities: No cyanosis, clubbing or edema Skin: No rashes or ulcers Psychiatry:  Mood & affect appropriate.   Data Reviewed: I have personally reviewed following labs and imaging studies  CBC: Recent Labs  Lab 09/30/19 1442 10/01/19 0336 10/02/19 0530 10/03/19 0434  WBC 13.6* 16.3* 11.4* 9.0  NEUTROABS 11.6*  --   --   --   HGB 11.0* 10.5* 10.4* 9.5*  HCT 37.5 35.7* 35.7* 32.9*  MCV 101.9* 101.4* 102.3* 101.9*  PLT 293 297 255 993   Basic Metabolic Panel: Recent Labs  Lab 09/30/19 1442 10/01/19 0336 10/02/19 0530 10/03/19 0434 10/05/19 0741  NA 135 135 134* 135 138  K 4.5 5.3* 4.7 4.0 4.0  CL 95* 95* 94* 92* 100  CO2 _1 GLUCOSE 152* 103* 171* 168* 119*  BUN 32* 36* 40* 40* 34*  CREATININE 7.30* 8.50* 8.39* 7.97* 6.20*  CALCIUM 10.6* 10.6* 10.4* 10.3 10.3  MG 3.2*  --   --   --   --   PHOS 2.2*  --   --   --   --    GFR: Estimated Creatinine Clearance: 5.2 mL/min (A) (by C-G formula based on SCr of 6.2 mg/dL (H)). Liver Function Tests: Recent Labs  Lab 09/30/19 1442 10/02/19 0530  AST 16 28  ALT 14 15  ALKPHOS 141* 134*  BILITOT 0.3 0.7  PROT 5.4* 5.0*  ALBUMIN 1.9* 1.7*   No results for input(s): LIPASE, AMYLASE in the last 168 hours. Recent Labs  Lab 09/30/19 1529 10/02/19 0530  AMMONIA 16 24   Coagulation Profile: Recent Labs  Lab 09/30/19 1442  INR 0.9   Cardiac Enzymes: No results for input(s): CKTOTAL, CKMB, CKMBINDEX, TROPONINI in the last 168 hours. BNP (last 3 results) No results for input(s): PROBNP in the last 8760 hours. HbA1C: No results for input(s): HGBA1C in the last 72 hours. CBG: Recent Labs  Lab  10/04/19 0742 10/04/19 1204 10/04/19 1729 10/04/19 2057 10/05/19 0700  GLUCAP 79 81 112* 107* 113*   Lipid Profile: No results for input(s): CHOL, HDL, LDLCALC, TRIG, CHOLHDL, LDLDIRECT in the last 72 hours. Thyroid Function Tests: No results for input(s): TSH, T4TOTAL, FREET4, T3FREE, THYROIDAB in the last 72 hours. Anemia Panel: No results for input(s): VITAMINB12, FOLATE, FERRITIN, TIBC, IRON, RETICCTPCT in the last 72 hours. Urine analysis:    Component Value Date/Time   COLORURINE AMBER (A) 10/02/2019 1600  APPEARANCEUR CLOUDY (A) 10/02/2019 1600   LABSPEC 1.012 10/02/2019 1600   PHURINE 6.0 10/02/2019 1600   GLUCOSEU 50 (A) 10/02/2019 1600   HGBUR MODERATE (A) 10/02/2019 1600   BILIRUBINUR NEGATIVE 10/02/2019 1600   KETONESUR NEGATIVE 10/02/2019 1600   PROTEINUR >=300 (A) 10/02/2019 1600   UROBILINOGEN 0.2 05/19/2015 0942   NITRITE NEGATIVE 10/02/2019 1600   LEUKOCYTESUR LARGE (A) 10/02/2019 1600   Sepsis Labs: _0 (procalcitonin:4,lacticidven:4) ) Recent Results (from the past 240 hour(s))  SARS CORONAVIRUS 2 (TAT 6-24 HRS) Nasopharyngeal Nasopharyngeal Swab     Status: None   Collection Time: 09/30/19  7:10 PM   Specimen: Nasopharyngeal Swab  Result Value Ref Range Status   SARS Coronavirus 2 NEGATIVE NEGATIVE Final    Comment: (NOTE) SARS-CoV-2 target nucleic acids are NOT DETECTED. The SARS-CoV-2 RNA is generally detectable in upper and lower respiratory specimens during the acute phase of infection. Negative results do not preclude SARS-CoV-2 infection, do not rule out co-infections with other pathogens, and should not be used as the sole basis for treatment or other patient management decisions. Negative results must be combined with clinical observations, patient history, and epidemiological information. The expected result is Negative. Fact Sheet for Patients: SugarRoll.be Fact Sheet for Healthcare Providers:  https://www.woods-mathews.com/ This test is not yet approved or cleared by the [place] FDA and  has been authorized for detection and/or diagnosis of SARS-CoV-2 by FDA under an Emergency Use Authorization (EUA). This EUA will remain  in effect (meaning this test can be used) for the duration of the COVID-19 declaration under Section 56 4(b)(1) of the Act, 21 U.S.C. section 360bbb-3(b)(1), unless the authorization is terminated or revoked sooner. Performed at [facility], [place] [address], [place], [place] [address]   Surgical pcr screen     Status: None   Collection Time: 10/01/19  1:44 AM   Specimen: Nasal Mucosa; Nasal Swab  Result Value Ref Range Status   MRSA, PCR NEGATIVE NEGATIVE Final   Staphylococcus aureus NEGATIVE NEGATIVE Final    Comment: (NOTE) The Xpert SA Assay (FDA approved for NASAL specimens in patients 66 years of age and older), is one component of a comprehensive surveillance program. It is not intended to diagnose infection nor to guide or monitor treatment. Performed at [facility], [place] [address], [place], [place] [address]          Radiology Studies: Ir Cyndy Freeze Guide Cv Line Right  Result Date: 10/03/2019 INDICATION: 78 year old female with a history of peritoneal hemodialysis EXAM: TUNNELED CENTRAL VENOUS HEMODIALYSIS CATHETER PLACEMENT WITH ULTRASOUND AND FLUOROSCOPIC GUIDANCE MEDICATIONS: 2 g Ancef. The antibiotic was given in an appropriate time interval prior to skin puncture. ANESTHESIA/SEDATION: Moderate (conscious) sedation was employed during this procedure. A total of Versed 1.0 mg and Fentanyl 25 mcg was administered intravenously. Moderate Sedation Time: 13 minutes. The patient's level of consciousness and vital signs were monitored continuously by radiology nursing throughout the procedure under my direct supervision. FLUOROSCOPY TIME:  Fluoroscopy Time: 0 minutes 6 seconds (1 mGy). COMPLICATIONS: None PROCEDURE:  Informed written consent was obtained from the patient after a discussion of the risks, benefits, and alternatives to treatment. Questions regarding the procedure were encouraged and answered. The right neck and chest were prepped with chlorhexidine in a sterile fashion, and a sterile drape was applied covering the operative field. Maximum barrier sterile technique with sterile gowns and gloves were used for the procedure. A timeout was performed prior to the initiation of the procedure. After  creating a small venotomy incision, a micropuncture kit was utilized to access the right internal jugular vein under direct, real-time ultrasound guidance after the overlying soft tissues were anesthetized with 1% lidocaine with epinephrine. Ultrasound image documentation was performed. The microwire was marked to measure appropriate internal catheter length. External tunneled length was estimated. A total tip to cuff length of 19 cm was selected. Skin and subcutaneous tissues of chest wall below the clavicle were generously infiltrated with 1% lidocaine for local anesthesia. A small stab incision was made with 11 blade scalpel. The selected hemodialysis catheter was tunneled in a retrograde fashion from the anterior chest wall to the venotomy incision. A guidewire was advanced to the level of the IVC and the micropuncture sheath was exchanged for a peel-away sheath. The catheter was then placed through the peel-away sheath with tips ultimately positioned within the superior aspect of the right atrium. Final catheter positioning was confirmed and documented with a spot radiographic image. The catheter aspirates and flushes normally. The catheter was flushed with appropriate volume heparin dwells. The catheter exit site was secured with a 0-Prolene retention suture. The venotomy incision was closed Derma bond and sterile dressing. Dressings were applied at the chest wall. Patient tolerated the procedure well and remained  hemodynamically stable throughout. No complications were encountered and no significant blood loss encountered. IMPRESSION: Status post right IJ tunneled hemodialysis catheter placement. Catheter ready for use. Signed, Dulcy Fanny. Dellia Nims, RPVI [facility] Electronically Signed   By: Corrie Mckusick D.O.   On: 10/03/2019 15:31   Ir US Guide Vasc Access Right  Result Date: 10/03/2019 INDICATION: 78 year old female with a history of peritoneal hemodialysis EXAM: TUNNELED CENTRAL VENOUS HEMODIALYSIS CATHETER PLACEMENT WITH ULTRASOUND AND FLUOROSCOPIC GUIDANCE MEDICATIONS: 2 g Ancef. The antibiotic was given in an appropriate time interval prior to skin puncture. ANESTHESIA/SEDATION: Moderate (conscious) sedation was employed during this procedure. A total of Versed 1.0 mg and Fentanyl 25 mcg was administered intravenously. Moderate Sedation Time: 13 minutes. The patient's level of consciousness and vital signs were monitored continuously by radiology nursing throughout the procedure under my direct supervision. FLUOROSCOPY TIME:  Fluoroscopy Time: 0 minutes 6 seconds (1 mGy). COMPLICATIONS: None PROCEDURE: Informed written consent was obtained from the patient after a discussion of the risks, benefits, and alternatives to treatment. Questions regarding the procedure were encouraged and answered. The right neck and chest were prepped with chlorhexidine in a sterile fashion, and a sterile drape was applied covering the operative field. Maximum barrier sterile technique with sterile gowns and gloves were used for the procedure. A timeout was performed prior to the initiation of the procedure. After creating a small venotomy incision, a micropuncture kit was utilized to access the right internal jugular vein under direct, real-time ultrasound guidance after the overlying soft tissues were anesthetized with 1% lidocaine with epinephrine. Ultrasound image  documentation was performed. The microwire was marked to measure appropriate internal catheter length. External tunneled length was estimated. A total tip to cuff length of 19 cm was selected. Skin and subcutaneous tissues of chest wall below the clavicle were generously infiltrated with 1% lidocaine for local anesthesia. A small stab incision was made with 11 blade scalpel. The selected hemodialysis catheter was tunneled in a retrograde fashion from the anterior chest wall to the venotomy incision. A guidewire was advanced to the level of the IVC and the micropuncture sheath was exchanged for a peel-away sheath. The catheter was then placed through the peel-away  sheath with tips ultimately positioned within the superior aspect of the right atrium. Final catheter positioning was confirmed and documented with a spot radiographic image. The catheter aspirates and flushes normally. The catheter was flushed with appropriate volume heparin dwells. The catheter exit site was secured with a 0-Prolene retention suture. The venotomy incision was closed Derma bond and sterile dressing. Dressings were applied at the chest wall. Patient tolerated the procedure well and remained hemodynamically stable throughout. No complications were encountered and no significant blood loss encountered. IMPRESSION: Status post right IJ tunneled hemodialysis catheter placement. Catheter ready for use. Signed, Dulcy Fanny. Dellia Nims, RPVI [facility] Electronically Signed   By: Corrie Mckusick D.O.   On: 10/03/2019 15:31      Scheduled Meds: . allopurinol  300 mg Oral Daily  . aspirin EC  81 mg Oral QHS  . Chlorhexidine Gluconate Cloth  6 each Topical Daily  . Chlorhexidine Gluconate Cloth  6 each Topical Q0600  . cinacalcet  30 mg Oral Q breakfast  . feeding supplement (PRO-STAT SUGAR FREE 64)  30 mL Oral BID  . insulin aspart  0-9 Units Subcutaneous TID WC  . lidocaine  15 mL  Mouth/Throat TID AC  . multivitamin  1 tablet Oral QHS  . omega-3 acid ethyl esters  1 g Oral Daily  . pantoprazole  40 mg Oral BID  . rosuvastatin  5 mg Oral q1800   Continuous Infusions: . dialysis solution 1.5% low-MG/low-CA       LOS: 4 days      Debbe Odea, MD [facility] Pager: [email or web address] Password [email or web address] 10/05/2019, 9:40 AM

## 2019-10-05 NOTE — Progress Notes (Addendum)
Daily Progress Note   Patient Name: Eileen Perez       Date: 10/05/2019 DOB: 01-Sep-1941  Age: 78 y.o. MRN#: [id number] Attending Physician: Debbe Odea, MD Primary Care Physician: Unk Pinto, MD Admit Date: 09/30/2019  Reason for Consultation/Follow-up: Establishing goals of care and Psychosocial/spiritual support  Subjective: Spoke with Eileen Perez at bedside.  She is extremely pleasant.  When I asked her where she was from she paused and told me [place].  The she shook her head no and put her head in her hands.  She was uncertain of how to answer the question.  He Iphone rang and she was unable to answer it.  When I answered it for her she accidentally hung it up.   I asked her to tell me about hemodialysis.  She put her head in her hands.  I asked her what she didn't like about it - was it painful?  She replied I don't know I've never had it.  Reportedly the patient's sister who lived in [place] prior to her death was on hemodialysis.  I spoke with her daughter Eileen Perez on the phone.  She is in [place].  The family takes turns coming down to spend time with Eileen Perez.  [name] understood how confused her mother is.  This is so unexpected to the family.  We talked about the patient's last hospitalization for low BP (60/30!). Eileen Perez had taken the BP after PD.  We discussed the brain not being perfused properly at such low BP.  Over time this could cause a form of dementia.  We discussed anticipatory needs - Durable POA, Patient not living alone, living with or near family.  Eileen Perez is has a meeting today with an "expert" on elder care to try to get prepared.  I emailed her a Occupational hygienist book.  We discussed DNR.  Eileen Perez will consider it but is not ready to give me a decision today.   Her mother has had such a great quality of life (everyone loves her!) and this is such an acute change.  Eileen Perez asked for the chaplain to please, please, please go and pray with her mother - this would give her security.   Assessment: Patient very pleasantly confused.  Ate a good lunch.   Patient Profile/HPI:  78 y.o. female  with past medical history  of ESRD on PD (since 2017), anemia, GERD, gout, prediabetes, gastric carcinoid )on surveillance with GI), recent C. diff infection  admitted on 09/30/2019 with increased confusion of unknown etiology. Could be a uremia factor to confusion with plans for hemodialysis trial. Also plans for MRI head.   Length of Stay: 4  Current Medications: Scheduled Meds:  . allopurinol  300 mg Oral Daily  . aspirin EC  81 mg Oral QHS  . Chlorhexidine Gluconate Cloth  6 each Topical Daily  . Chlorhexidine Gluconate Cloth  6 each Topical Q0600  . [START ON 10/06/2019] darbepoetin (ARANESP) injection - DIALYSIS  40 mcg Intravenous Q Mon-HD  . feeding supplement (PRO-STAT SUGAR FREE 64)  30 mL Oral BID  . insulin aspart  0-9 Units Subcutaneous TID WC  . lidocaine  15 mL Mouth/Throat TID AC  . multivitamin  1 tablet Oral QHS  . omega-3 acid ethyl esters  1 g Oral Daily  . pantoprazole  40 mg Oral BID  . rosuvastatin  5 mg Oral q1800    Continuous Infusions: . dialysis solution 1.5% low-MG/low-CA    . [START ON 10/06/2019] ferric gluconate (FERRLECIT/NULECIT) IV      PRN Meds: acetaminophen **OR** acetaminophen, dianeal solution for CAPD/CCPD with heparin, ondansetron **OR** ondansetron (ZOFRAN) IV  Physical Exam        Very pleasant well developed female, awake, alert, confused CV rrr Resp no distress Abdomen soft, nt, nd  Vital Signs: BP 102/63 (BP Location: Right Arm)   Pulse 99   Temp 98.1 F (36.7 C) (Oral)   Resp 16   Wt 44.9 kg   SpO2 99%   BMI 18.70 kg/m  SpO2: SpO2: 99 % O2 Device: O2 Device: Room Air O2 Flow Rate: O2 Flow Rate  (L/min): 2 L/min  Intake/output summary:   Intake/Output Summary (Last 24 hours) at 10/05/2019 1449 Last data filed at 10/05/2019 0800 Gross per 24 hour  Intake 440 ml  Output 0 ml  Net 440 ml   LBM: Last BM Date: 10/03/19 Baseline Weight: Weight: 44.8 kg Most recent weight: Weight: 44.9 kg       Palliative Assessment/Data:  50%      Patient Active Problem List   Diagnosis Date Noted  . Acute encephalopathy 09/30/2019  . Intractable nausea and vomiting 09/11/2019  . Diarrhea 09/11/2019  . Hypomagnesemia 09/11/2019  . Pressure injury of skin 09/11/2019  . Clostridioides difficile infection 09/11/2019  . Dehydration   . Hypotension 09/10/2019  . Esophagitis, [place] grade B 09/06/2019  . Gastritis 09/06/2019  . Protein-calorie malnutrition, severe 08/29/2019  . Small bowel obstruction ([place]) 08/28/2019  . Schatzki's ring of distal esophagus 06/23/2019  . 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 History of colon polyps 06/23/2019  . Enrolled in chronic care management 02/20/2019  . Aortic atherosclerosis ([place]) 02/13/2018  . T2_NIDDM w/ESRD ([place]) 05/19/2016  . Overweight (BMI 25.0-29.9) 10/31/2015  . ESRD (end stage renal disease) ([place]) 10/31/2015  . OA (osteoarthritis) of hip 05/26/2015  . Secondary hyperparathyroidism ([facility]) 04/16/2014  . Vitamin D deficiency 04/16/2014  . Hyperlipidemia, mixed 04/16/2014  . Medication management 04/16/2014  . Anemia of chronic Renal Dz   . Pancreatic cyst   . GERD   . Gout   . Osteopenia   . Essential hypertension 06/10/2013  . DJD 06/09/2013    Palliative Care Plan    Recommendations/Plan:  Continue current care.  Is an MR brain still being considered?  Daughter interested.  Will ask for regular  ambulation  Family needs to make decisions about where she will live (son in [place], dtr in [place]) - [place] team.  Full code for now.  Dtr considering.  Sounds like she will need HD long term.  Goals of Care and  Additional Recommendations:  Limitations on Scope of Treatment: Full Scope Treatment  Code Status:  Full code  Prognosis:   Unable to determine.     Discharge Planning:  To Be Determined.  TOC team for help with disposition.  Care plan was discussed with daughter.  Thank you for allowing the Palliative Medicine Team to assist in the care of this patient.  Total time spent:  35 min.     Greater than 50%  of this time was spent counseling and coordinating care related to the above assessment and plan.  Florentina Jenny, PA-C Palliative Medicine  Please contact Palliative MedicineTeam phone at [phone] for questions and concerns between 7 am - 7 pm.   Please see AMION for individual provider pager numbers.

## 2019-10-05 NOTE — Progress Notes (Signed)
   10/05/19 1700  Clinical Encounter Type  Visited With Health care provider;Patient not available  Visit Type Initial  Referral From Palliative care team   Presented to unit to see pt for referral from PMT.  Per charge RN, pt has d/c.  Temple Pacini [place], [phone]

## 2019-10-06 DIAGNOSIS — N2589 Other disorders resulting from impaired renal tubular function: Secondary | ICD-10-CM | POA: Diagnosis not present

## 2019-10-06 DIAGNOSIS — D631 Anemia in chronic kidney disease: Secondary | ICD-10-CM | POA: Diagnosis not present

## 2019-10-06 DIAGNOSIS — R82998 Other abnormal findings in urine: Secondary | ICD-10-CM | POA: Diagnosis not present

## 2019-10-06 DIAGNOSIS — Z992 Dependence on renal dialysis: Secondary | ICD-10-CM | POA: Diagnosis not present

## 2019-10-06 DIAGNOSIS — E119 Type 2 diabetes mellitus without complications: Secondary | ICD-10-CM | POA: Diagnosis not present

## 2019-10-06 DIAGNOSIS — Z23 Encounter for immunization: Secondary | ICD-10-CM | POA: Diagnosis not present

## 2019-10-06 DIAGNOSIS — N2581 Secondary hyperparathyroidism of renal origin: Secondary | ICD-10-CM | POA: Diagnosis not present

## 2019-10-06 DIAGNOSIS — D509 Iron deficiency anemia, unspecified: Secondary | ICD-10-CM | POA: Diagnosis not present

## 2019-10-06 DIAGNOSIS — N186 End stage renal disease: Secondary | ICD-10-CM | POA: Diagnosis not present

## 2019-10-06 LAB — RENAL FUNCTION PANEL
Albumin: 1.6 g/dL — ABNORMAL LOW (ref 3.5–5.0)
Anion gap: 12 (ref 5–15)
BUN: 60 mg/dL — ABNORMAL HIGH (ref 8–23)
CO2: 26 mmol/L (ref 22–32)
Calcium: 9.9 mg/dL (ref 8.9–10.3)
Chloride: 102 mmol/L (ref 98–111)
Creatinine, Ser: 8.07 mg/dL — ABNORMAL HIGH (ref 0.44–1.00)
GFR calc Af Amer: 5 mL/min — ABNORMAL LOW (ref >60)
GFR calc non Af Amer: 4 mL/min — ABNORMAL LOW (ref >60)
Glucose, Bld: 120 mg/dL — ABNORMAL HIGH (ref 70–99)
Phosphorus: 4.5 mg/dL (ref 2.5–4.6)
Potassium: 4 mmol/L (ref 3.5–5.1)
Sodium: 140 mmol/L (ref 135–145)

## 2019-10-06 LAB — CBC
HCT: 35 % — ABNORMAL LOW (ref 36.0–46.0)
Hemoglobin: 9.9 g/dL — ABNORMAL LOW (ref 12.0–15.0)
MCH: 29.6 pg (ref 26.0–34.0)
MCHC: 28.3 g/dL — ABNORMAL LOW (ref 30.0–36.0)
MCV: 104.5 fL — ABNORMAL HIGH (ref 80.0–100.0)
Platelets: 242 10*3/uL (ref 150–400)
RBC: 3.35 MIL/uL — ABNORMAL LOW (ref 3.87–5.11)
RDW: 16.5 % — ABNORMAL HIGH (ref 11.5–15.5)
WBC: 11.2 10*3/uL — ABNORMAL HIGH (ref 4.0–10.5)
nRBC: 0 % (ref 0.0–0.2)

## 2019-10-06 LAB — GLUCOSE, CAPILLARY
Glucose-Capillary: 88 mg/dL (ref 70–99)
Glucose-Capillary: 96 mg/dL (ref 70–99)
Glucose-Capillary: 138 mg/dL — ABNORMAL HIGH (ref 70–99)

## 2019-10-06 MED ORDER — HEPARIN SODIUM (PORCINE) 1000 UNIT/ML IJ SOLN
INTRAMUSCULAR | Status: AC
  Administered 2019-10-06: 1000 [IU] via INTRAVENOUS_CENTRAL
  Filled 2019-10-06: qty 1

## 2019-10-06 MED ORDER — HEPARIN 1000 UNIT/ML FOR PERITONEAL DIALYSIS
1000.0000 [IU] | Freq: Once | INTRAMUSCULAR | Status: AC
  Administered 2019-10-06: 08:00:00 1000 [IU] via INTRAVENOUS_CENTRAL

## 2019-10-06 MED ORDER — HEPARIN SODIUM (PORCINE) 1000 UNIT/ML IJ SOLN
INTRAMUSCULAR | Status: AC
  Filled 2019-10-06: qty 4

## 2019-10-06 MED ORDER — HEPARIN SODIUM (PORCINE) 1000 UNIT/ML DIALYSIS
3200.0000 [IU] | INTRAMUSCULAR | Status: DC | PRN
  Administered 2019-10-06 – 2019-10-12 (×4): 3200 [IU] via INTRAVENOUS_CENTRAL
  Filled 2019-10-06 (×2): qty 3.2

## 2019-10-06 MED ORDER — NEPRO/CARBSTEADY PO LIQD
237.0000 mL | Freq: Three times a day (TID) | ORAL | Status: DC
  Administered 2019-10-06 – 2019-10-10 (×12): 237 mL via ORAL

## 2019-10-06 MED ORDER — DARBEPOETIN ALFA 40 MCG/0.4ML IJ SOSY
PREFILLED_SYRINGE | INTRAMUSCULAR | Status: AC
  Administered 2019-10-06: 40 ug via INTRAVENOUS
  Filled 2019-10-06: qty 0.4

## 2019-10-06 NOTE — Progress Notes (Signed)
Subjective:  Seen on HD - confused still  Objective Vital signs in last 24 hours: Vitals:   10/06/19 0800 10/06/19 0814 10/06/19 0830 10/06/19 0900  BP: 99/60 (!) 77/47 (!) 82/53 (!) 80/52  Pulse: 100 (!) 101 100 93  Resp:      Temp:      TempSrc:      SpO2:      Weight:       Weight change: 0.9 kg  PPhysical Exam:  General= alert, soft spoke elderly female pleasantly disoriented,  NAD -  Think she recognizes me  Chest =clear bilatto bses Card= RRR no MRG Abd = soft ntnd LLQ PD cath+bs Ext= no LEedema/  R IJ Perm cath  Neuro =  alert, Oriented to place only, still not sure of month or day of week,    Home meds: - allopurinol 300/ pantoprazole 40 bid - rosuvastatin 5 qd/ Kdur 20 qd - prn's/ vitamins/ supplements  OutptPD: 5 exchanges, 2500 dwell, 2 hr each, no day bag, edw 48.5kg - started in 2017, last clearance here was 2.05 on 08/2019.   Problem/Plan: 1. AMS/ confusion - reportedly worseningover time butacutely the past 1-2 weeks. Not doing her PD properly, confusion Combination  may be uremia / advancing Dementia -  Dr Jonnie Finner Talked w/ daughter,plan of trial  hemodialysis here, consult palliative care for [facility] see how she does.Next HD today, needs clip for op hd -  Not sure if she is much clearer  2. ESRD - Was on PD x 3 yrs. As above, transition to HD trial for now. 3. Volume/BP- no vol excess on exam, under dry wt/ bp stable without meds -  Does not need much UF- not tolerating it - no BP meds  4. Anemia ckd - Aranesp 11/16 hd  40 mcg  q wk hd  op iron studies =09/23/19 22% Ferritin 1155  Start venofer x 3  5. Secondary Hyperparathyroidism -  labs fine on no meds  6. DMT2 - per admit  7. Hypoalbuminemia- ?sec  Combination PD , decreased appetite with uremia  / prostat  Supplement  , hold nepro  With recent diarrhea   Khalil Belote A Jermiyah Ricotta   10/06/2019,9:43 AM  LOS: 5 days     Labs: Basic Metabolic Panel: Recent Labs  Lab  09/30/19 1442  10/03/19 0434 10/05/19 0741 10/06/19 0804  NA 135   < > 135 138 140  K 4.5   < > 4.0 4.0 4.0  CL 95*   < > 92* 100 102  CO2 26   < > 26 26 26   GLUCOSE 152*   < > 168* 119* 120*  BUN 32*   < > 40* 34* 60*  CREATININE 7.30*   < > 7.97* 6.20* 8.07*  CALCIUM 10.6*   < > 10.3 10.3 9.9  PHOS 2.2*  --   --   --  4.5   < > = values in this interval not displayed.   Liver Function Tests: Recent Labs  Lab 09/30/19 1442 10/02/19 0530 10/06/19 0804  AST 16 28  --   ALT 14 15  --   ALKPHOS 141* 134*  --   BILITOT 0.3 0.7  --   PROT 5.4* 5.0*  --   ALBUMIN 1.9* 1.7* 1.6*   No results for input(s): LIPASE, AMYLASE in the last 168 hours. Recent Labs  Lab 09/30/19 1529 10/02/19 0530  AMMONIA 16 24   CBC: Recent Labs  Lab 09/30/19 1442 10/01/19 0336  10/02/19 0530 10/03/19 0434 10/06/19 0803  WBC 13.6* 16.3* 11.4* 9.0 11.2*  NEUTROABS 11.6*  --   --   --   --   HGB 11.0* 10.5* 10.4* 9.5* 9.9*  HCT 37.5 35.7* 35.7* 32.9* 35.0*  MCV 101.9* 101.4* 102.3* 101.9* 104.5*  PLT 293 297 255 301 242   Cardiac Enzymes: No results for input(s): CKTOTAL, CKMB, CKMBINDEX, TROPONINI in the last 168 hours. CBG: Recent Labs  Lab 10/04/19 2057 10/05/19 0700 10/05/19 1137 10/05/19 1719 10/05/19 2028  GLUCAP 107* 113* 152* 149* 152*    Studies/Results: No results found. Medications: . dialysis solution 1.5% low-MG/low-CA    . ferric gluconate (FERRLECIT/NULECIT) IV 125 mg (10/06/19 2979)   . allopurinol  300 mg Oral Daily  . aspirin EC  81 mg Oral QHS  . Chlorhexidine Gluconate Cloth  6 each Topical Daily  . Chlorhexidine Gluconate Cloth  6 each Topical Q0600  . darbepoetin (ARANESP) injection - DIALYSIS  40 mcg Intravenous Q Mon-HD  . feeding supplement (PRO-STAT SUGAR FREE 64)  30 mL Oral BID  . insulin aspart  0-9 Units Subcutaneous TID WC  . lidocaine  15 mL Mouth/Throat TID AC  . multivitamin  1 tablet Oral QHS  . omega-3 acid ethyl esters  1 g Oral Daily   . pantoprazole  40 mg Oral BID  . rosuvastatin  5 mg Oral q1800

## 2019-10-06 NOTE — Progress Notes (Signed)
Initial Nutrition Assessment  DOCUMENTATION CODES:   Severe malnutrition in context of chronic illness  INTERVENTION:    Nepro Shake po TID, each supplement provides 425 kcal and 19 grams protein  Renal MVI  NUTRITION DIAGNOSIS:   Severe Malnutrition related to chronic illness(ESRD) as evidenced by severe fat depletion, severe muscle depletion.  GOAL:   Patient will meet greater than or equal to 90% of their needs  MONITOR:   PO intake, Supplement acceptance, Weight trends, Labs, I & O's  REASON FOR ASSESSMENT:   Malnutrition Screening Tool    ASSESSMENT:   Patient with PMH significant for ESRD on PD, gastric carcinoid, and DM. Presents this admission with acute metabolic encephalopathy due to suspected missed/inproper PD treatments.   Had first HD treatment on 11/13. Will likely require long term HD post discharge.   Pt remain slightly confused. Reports having a poor appetite for 1 week PTA. Unable to recall meals. Does not like Ensure and drinks Nepro inconsistently.  Meal completions charted as 35-100% for her last five meals. Swallowing issues documented in the past, no complaints this admission. RD to provide supplementation to maximize kcal and protein this admission.   Pt unsure of UBW. Per chart records pt weighed 139 lb on 8/4 and 98 lb this admission. Unable to determine how much is dry wt loss versus fluid fluctuation. Per daughter pt lost 13 lb in the month PTA but gained some back.   I/O: +2,441 ml since admit   Medications: aranesp, SS novolog, rena-vit Labs: CBG 88-152  Diet Order:   Diet Order            Diet renal with fluid restriction Fluid restriction: 1200 mL Fluid; Room service appropriate? Yes; Fluid consistency: Thin  Diet effective now              EDUCATION NEEDS:   Education needs have been addressed  Skin:  Skin Assessment: Skin Integrity Issues: Skin Integrity Issues:: Stage II, Incisions Stage II: coccyx Incisions: R neck, R  chest  Last BM:  11/16  Height:   Ht Readings from Last 1 Encounters:  09/18/19 5\' 1"  (1.549 m)    Weight:   Wt Readings from Last 1 Encounters:  10/06/19 45.6 kg    Ideal Body Weight:  47.7 kg  BMI:  Body mass index is 18.99 kg/m.  Estimated Nutritional Needs:   Kcal:  1500-1700 kcal  Protein:  75-90 grams  Fluid:  1000 ml + UOP   Mariana Single RD, LDN Clinical Nutrition Pager # [phone]

## 2019-10-06 NOTE — Procedures (Signed)
Patient was seen on dialysis and the procedure was supervised.  BFR 350  Via TDC BP is  73/46.   Patient appears to be tolerating treatment well  Louis Meckel 10/06/2019

## 2019-10-06 NOTE — Progress Notes (Signed)
Chaplain followed-up with the patient and offered prayer.  The chaplain will follow-up if necessary.  Brion Aliment Chaplain Resident For questions concerning this note please contact me by pager [phone]

## 2019-10-06 NOTE — Progress Notes (Signed)
PROGRESS NOTE    [name]   JHE:[id number]  DOB: 1941/07/15  DOA: 09/30/2019 PCP: Unk Pinto, MD   Brief Narrative:  Eileen Perez the patient is a 78 y.o. year-old who lives alone with ESRD on peritoneal dialysis, gastric carcinoid, gout, DM2   presenting to ED with confusion. She lives alone. Her grand daugther noted confusion over the weekend and her [facility] went to check on her on Tuesday and noted severe confusion. Her daughter [name] does not live with her tells me that a dialysis RN or a family member were helping with her dialysis. She was also eating poorly this past week.   In ED> head CT unrevealing, Ammonia level negative   Subjective: No new complaints. Has ongoing confusion.  Assessment & Plan:   Principal Problem:   Acute metabolic encephalopathy -per daughter,  the patient was sharp enough to do her own dialysis at home but has been confused about 1 wk prior to admission -  thought to be due to inability to do dialysis properly and subsequent uremia - she remains confused but nephrology and I feel she is improving with dialysis- if she does not improve further after dialysis today, will consider an MRI - B12 is 778 , ammonia 16-     Active Problems:     ESRD (end stage renal disease) on peritoneal dialysis since 2017 - appreciate nephrology assisting with dialysis- HD started 11/13  - she will need it long term and needs Clipping  Diarrhea - Recent C diff colitis - abdomen non-tender, no leukocytosis of fevers thus likely not infectious - d/c'd Nepro- diarrhea has improved    Anemia of chronic disease - stable  Gout - cont allopurinol  Weight loss - per daughter she lost about 13 lb but has recently gained some back  Hypoalbuminemia - ? If related to recent C diff infection and resultant poor oral intake  DM2 - last A1c 6.5 in 4/20- not on medications at home so I suspect it is diet controlled- sugars have been normal and thus d/c'd   fingerstick's.  HLD Crestor   Carcinoid tumor of stomach   Time spent in minutes: 35  DVT prophylaxis: Heparin Code Status: Full code Family Communication:  Daughter [name] Disposition Plan: to be determined Consultants:   Nephrology  Palliative care  IR Procedures:   HD cath  Antimicrobials:  Anti-infectives (From admission, onward)   Start     Dose/Rate Route Frequency Ordered Stop   10/03/19 1403  ceFAZolin (ANCEF) 2-4 GM/100ML-% IVPB    Note to Pharmacy: Manuela Neptune   : cabinet override      10/03/19 1403 10/03/19 1407   10/03/19 0000  ceFAZolin (ANCEF) IVPB 2g/100 mL premix     2 g 200 mL/hr over 30 Minutes Intravenous  Once 10/02/19 1703 10/03/19 0249   10/02/19 1530  ceFAZolin (ANCEF) IVPB 2g/100 mL premix  Status:  Discontinued     2 g 200 mL/hr over 30 Minutes Intravenous  Once 10/02/19 1518 10/02/19 1703   10/01/19 0800  vancomycin (VANCOCIN) 50 mg/mL oral solution 125 mg  Status:  Discontinued     125 mg Oral 4 times daily 10/01/19 0631 10/01/19 1313       Objective: Vitals:   10/06/19 0900 10/06/19 0930 10/06/19 0945 10/06/19 1000  BP: (!) 80/52 (!) 92/45 (!) 73/46 (!) (P) 73/45  Pulse: 93 96 96 (!) (P) 108  Resp:      Temp:      TempSrc:  SpO2:      Weight:        Intake/Output Summary (Last 24 hours) at 10/06/2019 1205 Last data filed at 10/06/2019 0900 Gross per 24 hour  Intake 120 ml  Output 0 ml  Net 120 ml   Filed Weights   10/03/19 1450 10/05/19 0421 10/06/19 0700  Weight: 45.3 kg 44.9 kg 45.8 kg    Examination: General exam: Appears comfortable  HEENT: PERRLA, oral mucosa moist, no sclera icterus or thrush Respiratory system: Clear to auscultation. Respiratory effort normal. Cardiovascular system: S1 & S2 heard,  No murmurs  Gastrointestinal system: Abdomen soft, non-tender, nondistended. Normal bowel sounds   Central nervous system: Alert and oriented to person and place. No focal neurological  deficits. Extremities: No cyanosis, clubbing or edema Skin: No rashes or ulcers Psychiatry:  Mood & affect appropriate.    Data Reviewed: I have personally reviewed following labs and imaging studies  CBC: Recent Labs  Lab 09/30/19 1442 10/01/19 0336 10/02/19 0530 10/03/19 0434 10/06/19 0803  WBC 13.6* 16.3* 11.4* 9.0 11.2*  NEUTROABS 11.6*  --   --   --   --   HGB 11.0* 10.5* 10.4* 9.5* 9.9*  HCT 37.5 35.7* 35.7* 32.9* 35.0*  MCV 101.9* 101.4* 102.3* 101.9* 104.5*  PLT 293 297 255 301 161   Basic Metabolic Panel: Recent Labs  Lab 09/30/19 1442 10/01/19 0336 10/02/19 0530 10/03/19 0434 10/05/19 0741 10/06/19 0804  NA 135 135 134* 135 138 140  K 4.5 5.3* 4.7 4.0 4.0 4.0  CL 95* 95* 94* 92* 100 102  CO2 26 27 25 26 26 26   GLUCOSE 152* 103* 171* 168* 119* 120*  BUN 32* 36* 40* 40* 34* 60*  CREATININE 7.30* 8.50* 8.39* 7.97* 6.20* 8.07*  CALCIUM 10.6* 10.6* 10.4* 10.3 10.3 9.9  MG 3.2*  --   --   --   --   --   PHOS 2.2*  --   --   --   --  4.5   GFR: Estimated Creatinine Clearance: 4.1 mL/min (A) (by C-G formula based on SCr of 8.07 mg/dL (H)). Liver Function Tests: Recent Labs  Lab 09/30/19 1442 10/02/19 0530 10/06/19 0804  AST 16 28  --   ALT 14 15  --   ALKPHOS 141* 134*  --   BILITOT 0.3 0.7  --   PROT 5.4* 5.0*  --   ALBUMIN 1.9* 1.7* 1.6*   No results for input(s): LIPASE, AMYLASE in the last 168 hours. Recent Labs  Lab 09/30/19 1529 10/02/19 0530  AMMONIA 16 24   Coagulation Profile: Recent Labs  Lab 09/30/19 1442  INR 0.9   Cardiac Enzymes: No results for input(s): CKTOTAL, CKMB, CKMBINDEX, TROPONINI in the last 168 hours. BNP (last 3 results) No results for input(s): PROBNP in the last 8760 hours. HbA1C: No results for input(s): HGBA1C in the last 72 hours. CBG: Recent Labs  Lab 10/04/19 2057 10/05/19 0700 10/05/19 1137 10/05/19 1719 10/05/19 2028  GLUCAP 107* 113* 152* 149* 152*   Lipid Profile: No results for input(s):  CHOL, HDL, LDLCALC, TRIG, CHOLHDL, LDLDIRECT in the last 72 hours. Thyroid Function Tests: No results for input(s): TSH, T4TOTAL, FREET4, T3FREE, THYROIDAB in the last 72 hours. Anemia Panel: No results for input(s): VITAMINB12, FOLATE, FERRITIN, TIBC, IRON, RETICCTPCT in the last 72 hours. Urine analysis:    Component Value Date/Time   COLORURINE AMBER (A) 10/02/2019 1600   APPEARANCEUR CLOUDY (A) 10/02/2019 1600   LABSPEC 1.012 10/02/2019 1600  PHURINE 6.0 10/02/2019 1600   GLUCOSEU 50 (A) 10/02/2019 1600   HGBUR MODERATE (A) 10/02/2019 1600   BILIRUBINUR NEGATIVE 10/02/2019 1600   KETONESUR NEGATIVE 10/02/2019 1600   PROTEINUR >=300 (A) 10/02/2019 1600   UROBILINOGEN 0.2 05/19/2015 0942   NITRITE NEGATIVE 10/02/2019 1600   LEUKOCYTESUR LARGE (A) 10/02/2019 1600   Sepsis Labs: @LABRCNTIP (procalcitonin:4,lacticidven:4) ) Recent Results (from the past 240 hour(s))  SARS CORONAVIRUS 2 (TAT 6-24 HRS) Nasopharyngeal Nasopharyngeal Swab     Status: None   Collection Time: 09/30/19  7:10 PM   Specimen: Nasopharyngeal Swab  Result Value Ref Range Status   SARS Coronavirus 2 NEGATIVE NEGATIVE Final    Comment: (NOTE) SARS-CoV-2 target nucleic acids are NOT DETECTED. The SARS-CoV-2 RNA is generally detectable in upper and lower respiratory specimens during the acute phase of infection. Negative results do not preclude SARS-CoV-2 infection, do not rule out co-infections with other pathogens, and should not be used as the sole basis for treatment or other patient management decisions. Negative results must be combined with clinical observations, patient history, and epidemiological information. The expected result is Negative. Fact Sheet for Patients: SugarRoll.be Fact Sheet for Healthcare Providers: https://www.woods-mathews.com/ This test is not yet approved or cleared by the [place] FDA and  has been authorized for detection and/or  diagnosis of SARS-CoV-2 by FDA under an Emergency Use Authorization (EUA). This EUA will remain  in effect (meaning this test can be used) for the duration of the COVID-19 declaration under Section 56 4(b)(1) of the Act, 21 U.S.C. section 360bbb-3(b)(1), unless the authorization is terminated or revoked sooner. Performed at [facility], [place] [address], [place], [place] [address]   Surgical pcr screen     Status: None   Collection Time: 10/01/19  1:44 AM   Specimen: Nasal Mucosa; Nasal Swab  Result Value Ref Range Status   MRSA, PCR NEGATIVE NEGATIVE Final   Staphylococcus aureus NEGATIVE NEGATIVE Final    Comment: (NOTE) The Xpert SA Assay (FDA approved for NASAL specimens in patients 70 years of age and older), is one component of a comprehensive surveillance program. It is not intended to diagnose infection nor to guide or monitor treatment. Performed at [facility], [place] [address] [place], [place] [address]          Radiology Studies: No results found.    Scheduled Meds:  allopurinol  300 mg Oral Daily   aspirin EC  81 mg Oral QHS   Chlorhexidine Gluconate Cloth  6 each Topical Daily   Chlorhexidine Gluconate Cloth  6 each Topical Q0600   darbepoetin (ARANESP) injection - DIALYSIS  40 mcg Intravenous Q Mon-HD   feeding supplement (PRO-STAT SUGAR FREE 64)  30 mL Oral BID   insulin aspart  0-9 Units Subcutaneous TID WC   lidocaine  15 mL Mouth/Throat TID AC   multivitamin  1 tablet Oral QHS   omega-3 acid ethyl esters  1 g Oral Daily   pantoprazole  40 mg Oral BID   rosuvastatin  5 mg Oral q1800   Continuous Infusions:  dialysis solution 1.5% low-MG/low-CA     ferric gluconate (FERRLECIT/NULECIT) IV Stopped (10/06/19 1041)     LOS: 5 days      Debbe Odea, MD [facility] Pager: [email or web address] Password [email or web address] 10/06/2019, 12:05 PM

## 2019-10-07 ENCOUNTER — Encounter (HOSPITAL_COMMUNITY): Payer: Self-pay | Admitting: Radiology

## 2019-10-07 ENCOUNTER — Inpatient Hospital Stay (HOSPITAL_COMMUNITY): Payer: Medicare Other

## 2019-10-07 DIAGNOSIS — Z992 Dependence on renal dialysis: Secondary | ICD-10-CM | POA: Diagnosis not present

## 2019-10-07 DIAGNOSIS — D631 Anemia in chronic kidney disease: Secondary | ICD-10-CM | POA: Diagnosis not present

## 2019-10-07 DIAGNOSIS — Z515 Encounter for palliative care: Secondary | ICD-10-CM

## 2019-10-07 DIAGNOSIS — Z8673 Personal history of transient ischemic attack (TIA), and cerebral infarction without residual deficits: Secondary | ICD-10-CM | POA: Insufficient documentation

## 2019-10-07 DIAGNOSIS — Z23 Encounter for immunization: Secondary | ICD-10-CM | POA: Diagnosis not present

## 2019-10-07 DIAGNOSIS — N186 End stage renal disease: Secondary | ICD-10-CM | POA: Diagnosis not present

## 2019-10-07 DIAGNOSIS — I639 Cerebral infarction, unspecified: Secondary | ICD-10-CM

## 2019-10-07 DIAGNOSIS — N2581 Secondary hyperparathyroidism of renal origin: Secondary | ICD-10-CM | POA: Diagnosis not present

## 2019-10-07 DIAGNOSIS — R82998 Other abnormal findings in urine: Secondary | ICD-10-CM | POA: Diagnosis not present

## 2019-10-07 DIAGNOSIS — E119 Type 2 diabetes mellitus without complications: Secondary | ICD-10-CM | POA: Diagnosis not present

## 2019-10-07 DIAGNOSIS — N2589 Other disorders resulting from impaired renal tubular function: Secondary | ICD-10-CM | POA: Diagnosis not present

## 2019-10-07 DIAGNOSIS — D509 Iron deficiency anemia, unspecified: Secondary | ICD-10-CM | POA: Diagnosis not present

## 2019-10-07 HISTORY — DX: Personal history of transient ischemic attack (TIA), and cerebral infarction without residual deficits: Z86.73

## 2019-10-07 LAB — GLUCOSE, CAPILLARY
Glucose-Capillary: 157 mg/dL — ABNORMAL HIGH (ref 70–99)
Glucose-Capillary: 95 mg/dL (ref 70–99)
Glucose-Capillary: 141 mg/dL — ABNORMAL HIGH (ref 70–99)

## 2019-10-07 MED ORDER — PRO-STAT SUGAR FREE PO LIQD
30.0000 mL | Freq: Two times a day (BID) | ORAL | Status: DC
  Administered 2019-10-07 – 2019-10-11 (×10): 30 mL via ORAL
  Filled 2019-10-07 (×11): qty 30

## 2019-10-07 NOTE — Progress Notes (Signed)
Occupational Therapy Treatment Patient Details Name: Eileen Perez MRN: [id number] DOB: [birth year] Today's Date: 10/07/2019    History of present illness 78 y.o. 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 Plan for dialysis catheter 11/13.   OT comments  Patient in bed upon arrival, requesting to brush her teeth. Patient incontinent of stool in bed, patient unaware. Cue patient to transfer to bathroom patient require min A for thoroughness with peri care. Patient min guard/min assist with ambulation using rolling walker for ambulation and functional transfers. Patient require assist to squeeze tooth paste to brush her teeth, min guard assist. Patient request to return to bed at end of session.   Follow Up Recommendations  SNF;Other (comment)(unless family continues to provide 24/7 then [facility])    Equipment Recommendations  None recommended by OT       Precautions / Restrictions Precautions Precautions: Fall;Other (comment)(enteric) Restrictions Weight Bearing Restrictions: No       Mobility Bed Mobility Overal bed mobility: Needs Assistance Bed Mobility: Supine to Sit;Sit to Supine     Supine to sit: Supervision Sit to supine: Supervision   General bed mobility comments: patient needs to use her arms to lift L LE onto edge of bed  Transfers Overall transfer level: Needs assistance Equipment used: Rolling walker (2 wheeled) Transfers: Sit to/from Stand Sit to Stand: Min guard         General transfer comment: S for safety, cues for hand placement, extended time    Balance Overall balance assessment: Needs assistance Sitting-balance support: Feet supported Sitting balance-Leahy Scale: Good     Standing  balance support: During functional activity;Bilateral upper extremity supported Standing balance-Leahy Scale: Poor Standing balance comment: UE support for balance using rolling walker                           ADL either performed or assessed with clinical judgement   ADL       Grooming: Wash/dry face;Oral care;Min guard;Standing Grooming Details (indicate cue type and reason): pt requires assist to squeeze tooth paste due to decreased grip strength             Lower Body Dressing: Minimal assistance;Sitting/lateral leans Lower Body Dressing Details (indicate cue type and reason): patient able to complete 75% of donning socks, require assist over heels socks are tight around back of foot Toilet Transfer: Minimal assistance;RW;Ambulation;Comfort height toilet Toilet Transfer Details (indicate cue type and reason): patient reaching back for commode arm upon sitting Toileting- Clothing Manipulation and Hygiene: Minimal assistance;Sit to/from stand Toileting - Clothing Manipulation Details (indicate cue type and reason): obsevered bowel incontinence when getting out of bed, patient unaware. min A for thoroughness of peri care     Functional mobility during ADLs: Min guard;Minimal assistance;Rolling walker;Cueing for safety                 Cognition Arousal/Alertness: Awake/alert Behavior During Therapy: Flat affect Overall Cognitive Status: Impaired/Different from baseline Area of Impairment: Awareness, Safety/Judgement                Following Commands: Follows one step commands consistently Safety/Judgement: Decreased awareness of safety;Decreased awareness of deficits Awareness: Intellectual          Exercises     Shoulder Instructions  General Comments      Pertinent Vitals/ Pain       Pain Assessment: No/denies pain          Frequency  Min 2X/week        Progress Toward Goals  OT Goals(current goals can now be found in  the care plan section)  Progress towards OT goals: Progressing toward goals  Acute Rehab OT Goals Patient Stated Goal: to brush my teeth OT Goal Formulation: With patient Time For Goal Achievement: 10/15/19 Potential to Achieve Goals: Good ADL Goals Pt Will Perform Grooming: with supervision;standing Pt Will Transfer to Toilet: with supervision;ambulating;bedside commode Pt Will Perform Toileting - Clothing Manipulation and hygiene: with supervision;sit to/from stand Additional ADL Goal #1: pt will not need more than 2 safety cues per session during adls/bathroom mobility  Plan Discharge plan remains appropriate;Frequency remains appropriate       AM-PAC OT "6 Clicks" Daily Activity     Outcome Measure   Help from another person eating meals?: None Help from another person taking care of personal grooming?: A Little Help from another person toileting, which includes using toliet, bedpan, or urinal?: A Little Help from another person bathing (including washing, rinsing, drying)?: A Little Help from another person to put on and taking off regular upper body clothing?: A Little Help from another person to put on and taking off regular lower body clothing?: A Little 6 Click Score: 19    End of Session Equipment utilized during treatment: Rolling walker Activity tolerance:Patient tolerated treatment well Patient left: In bed with call light and bed alarmed   OT Visit Diagnosis: Muscle weakness (generalized) (M62.81);Cognitive communication deficit (R41.841)                 Time: 1552-0802 OT Time Calculation (min): 23 min  Charges: OT General Charges $OT Visit: 1 Visit OT Treatments $Self Care/Home Management : 23-37 mins  Shon Millet OT OT office: [place] 10/07/2019, 1:48 PM

## 2019-10-07 NOTE — Progress Notes (Addendum)
Subjective:  No Cos,  Confusion continues, Does not remember Hd yesterday . She was able to pull my name out -  Understands that she is on hemo now and is going OK  Objective Vital signs in last 24 hours: Vitals:   10/06/19 1044 10/06/19 1633 10/06/19 2123 10/07/19 0517  BP: (!) 90/52 (!) 91/48 101/60 (!) 110/58  Pulse: (!) 101 99 94 (!) 101  Resp: 16 18 15 14   Temp: (!) 97.4 F (36.3 C) 99.2 F (37.3 C) 98.4 F (36.9 C) 98.4 F (36.9 C)  TempSrc: Oral Oral Oral Oral  SpO2: 97% 92% 98% 98%  Weight: 45.6 kg   45 kg   Weight change: -0.2 kg  Physical Exam: General=alert,soft spoke elderly female pleasantlydisoriented,NAD Chest=clear bilatto bses Card=RRR no MRG Abd=soft ntnd LLQ PD cath+bs Ext=no LEedema/ R IJ Perm cath  Neuro=alert, Oriented to place only and 2020, still not sure of month or day of week, Moves all extrem to request   Home meds: - allopurinol 300/ pantoprazole 40 bid - rosuvastatin 5 qd/ Kdur 20 qd - prn's/ vitamins/ supplements  OutptPD: 5 exchanges, 2500 dwell, 2 hr each, no day bag, edw 48.5kg - started in 2017, last clearance here was 2.05 on 08/2019.   Problem/Plan: 1. AMS/ confusion - reportedly worseningover time butacutely the past 1-2 weeks. Not doing her PD properly, confusionCombinationmay be uremia / advancing Dementia -  Dr Dorie Rank w/ daughter,planoftrial hemodialysis here, consult palliative care for [facility] see how she does.Next HD tomorrow , After HD X2 still not sure if she is much clearer / MRI  Per admit pending  2. ESRD - Was on PD x 3 yrs. As above, transition to HD trial for now.needs clip for op hd - 3. Volume/BP- no vol excess on exam, under dry wt/ bp stable without meds -  Does not need much UF- not tolerating it - no BP meds  4. Anemia ckd - HGB 9.9  Aranesp 11/16 hd  40 mcg  q wk hd  op iron studies =09/23/19 22% Ferritin 1155 have  Started venofer x 3  5. Secondary  Hyperparathyroidism - phos 4.5  Ca  9.9  Corec 11.8  on no meds / use low ca bath  6. DMT2 - per admit  7. Hypoalbuminemia- ?sec Combination PD , decreased appetite with uremia /Nepro started 11/16 , hold nepro if Diarrhea  recurrent ( HO recent diarrhea ) Pro stat    [name], PA-C [place] [phone] 10/07/2019,8:52 AM  LOS: 6 days    Patient seen and examined, agree with above note with above modifications. She is maybe more oriented today.  Planning for HD tomorrow on schedule and will plan for long term HD and likely discharge to a SNF   Unless family wants to move in and do the PD for her or take her up Nicholes Calamity, MD 10/07/2019     Labs: Basic Metabolic Panel: Recent Labs  Lab 09/30/19 1442  10/03/19 0434 10/05/19 0741 10/06/19 0804  NA 135   < > 135 138 140  K 4.5   < > 4.0 4.0 4.0  CL 95*   < > 92* 100 102  CO2 26   < > 26 26 26   GLUCOSE 152*   < > 168* 119* 120*  BUN 32*   < > 40* 34* 60*  CREATININE 7.30*   < > 7.97* 6.20* 8.07*  CALCIUM 10.6*   < > 10.3 10.3 9.9  PHOS 2.2*  --   --   --  4.5   < > = values in this interval not displayed.   Liver Function Tests: Recent Labs  Lab 09/30/19 1442 10/02/19 0530 10/06/19 0804  AST 16 28  --   ALT 14 15  --   ALKPHOS 141* 134*  --   BILITOT 0.3 0.7  --   PROT 5.4* 5.0*  --   ALBUMIN 1.9* 1.7* 1.6*   No results for input(s): LIPASE, AMYLASE in the last 168 hours. Recent Labs  Lab 09/30/19 1529 10/02/19 0530  AMMONIA 16 24   CBC: Recent Labs  Lab 09/30/19 1442 10/01/19 0336 10/02/19 0530 10/03/19 0434 10/06/19 0803  WBC 13.6* 16.3* 11.4* 9.0 11.2*  NEUTROABS 11.6*  --   --   --   --   HGB 11.0* 10.5* 10.4* 9.5* 9.9*  HCT 37.5 35.7* 35.7* 32.9* 35.0*  MCV 101.9* 101.4* 102.3* 101.9* 104.5*  PLT 293 297 255 301 242   Cardiac Enzymes: No results for input(s): CKTOTAL, CKMB, CKMBINDEX, TROPONINI in the last 168 hours. CBG: Recent Labs  Lab  10/05/19 2028 10/06/19 1210 10/06/19 1725 10/06/19 2123 10/07/19 0818  GLUCAP 152* 88 96 138* 157*    Studies/Results: No results found. Medications: . dialysis solution 1.5% low-MG/low-CA    . ferric gluconate (FERRLECIT/NULECIT) IV Stopped (10/06/19 1041)   . allopurinol  300 mg Oral Daily  . aspirin EC  81 mg Oral QHS  . Chlorhexidine Gluconate Cloth  6 each Topical Daily  . Chlorhexidine Gluconate Cloth  6 each Topical Q0600  . darbepoetin (ARANESP) injection - DIALYSIS  40 mcg Intravenous Q Mon-HD  . feeding supplement (NEPRO CARB STEADY)  237 mL Oral TID BM  . insulin aspart  0-9 Units Subcutaneous TID WC  . lidocaine  15 mL Mouth/Throat TID AC  . multivitamin  1 tablet Oral QHS  . omega-3 acid ethyl esters  1 g Oral Daily  . pantoprazole  40 mg Oral BID  . rosuvastatin  5 mg Oral q1800

## 2019-10-07 NOTE — Consult Note (Addendum)
Neurology Consultation  Reason for Consult: Stroke  Referring Physician: Dr. Wynelle Cleveland  CC: Confusion  History is obtained from: Chart  HPI: Eileen Perez is a 78 y.o. female with past medical history of pancreatic cyst, hypertension, hyperlipidemia, hiatal hernia, history of blood transfusion, diabetes and chronic kidney disease.  Patient was admitted secondary to increased confusion.  On admission patient's potassium is 4.5, creatinine 7.3, phosphorus 2.2, magnesium 3.2, hemoglobin 11 with platelets of 193.  WBC was 13.6 and chest x-ray was unremarkable.  Her AMS was initially felt to be due to uremia, but she remained confused after 2 hemodialysis treatments. MRI was obtained, revealing multiple small areas of acute infarct bilaterally that suggested a cardioembolic etiology.     LKW: Unknown tpa given?: no, out of window Premorbid modified Rankin scale (mRS): 1 NIH stroke scale of 2 secondary to unable to answer questions    Past Medical History:  Diagnosis Date  . Anemia   . Arthritis    Osteoarthritis  . Chronic kidney disease    Chronic renal insufficiency, peritoneal dialysis qday sees dr Moshe Cipro  . Diabetes mellitus    Pre  . Diverticulosis   . GERD (gastroesophageal reflux disease)   . Gout   . Hiatal hernia   . History of blood transfusion   . Hyperlipidemia   . Hypertension   . Osteopenia   . Pancreatic cyst 1999  . Thyroid disease    Hyperparathyroid     Family History  Problem Relation Age of Onset  . Cancer Father        Prostate and Throat  . Hypertension Mother   . Cancer Mother        Colon with METS  . Diabetes Mother    Social History:   reports that she has never smoked. She has never used smokeless tobacco. She reports that she does not drink alcohol or use drugs.  Medications  Current Facility-Administered Medications:  .  acetaminophen (TYLENOL) tablet 650 mg, 650 mg, Oral, Q6H PRN **OR** acetaminophen (TYLENOL) suppository 650 mg, 650  mg, Rectal, Q6H PRN, Rise Patience, MD .  allopurinol (ZYLOPRIM) tablet 300 mg, 300 mg, Oral, Daily, Rise Patience, MD, 300 mg at 10/07/19 0834 .  aspirin EC tablet 81 mg, 81 mg, Oral, QHS, Rise Patience, MD, 81 mg at 10/06/19 2147 .  Chlorhexidine Gluconate Cloth 2 % PADS 6 each, 6 each, Topical, Daily, Debbe Odea, MD, 6 each at 10/07/19 [phone] .  Chlorhexidine Gluconate Cloth 2 % PADS 6 each, 6 each, Topical, Q0600, Roney Jaffe, MD, 6 each at 10/07/19 [phone] .  Darbepoetin Alfa (ARANESP) injection 40 mcg, 40 mcg, Intravenous, Q Mon-HD, [name], PA-C, 40 mcg at 10/06/19 0934 .  dialysis solution 1.5% low-MG/low-CA dianeal solution, , Intraperitoneal, Q24H, Roney Jaffe, MD, 5,000 mL at 10/02/19 1842 .  feeding supplement (NEPRO CARB STEADY) liquid 237 mL, 237 mL, Oral, TID BM, Rizwan, Saima, MD, 237 mL at 10/07/19 1652 .  feeding supplement (PRO-STAT SUGAR FREE 64) liquid 30 mL, 30 mL, Oral, BID, [name], PA-C, 30 mL at 10/07/19 1208 .  ferric gluconate (NULECIT) 125 mg in sodium chloride 0.9 % 100 mL IVPB, 125 mg, Intravenous, Q M,W,F-HD, Zeyfang, David, PA-C, Stopped at 10/06/19 1041 .  heparin 1000 unit/ml injection 3,200 Units, 3,200 Units, Dialysis, PRN, [name], PA-C, 3,200 Units at 10/06/19 1040 .  heparin 2,500 Units in dialysis solution 1.5% low-MG/low-CA 5,000 mL dialysis solution, , Peritoneal Dialysis, PRN, Roney Jaffe, MD .  insulin aspart (novoLOG) injection 0-9 Units, 0-9 Units, Subcutaneous, TID WC, Rise Patience, MD, 2 Units at 10/07/19 1209 .  lidocaine (XYLOCAINE) 2 % viscous mouth solution 15 mL, 15 mL, Mouth/Throat, TID AC, Pershing Proud, NP, 15 mL at 10/07/19 1209 .  multivitamin (RENA-VIT) tablet 1 tablet, 1 tablet, Oral, QHS, [name], PA-C, 1 tablet at 10/06/19 2147 .  omega-3 acid ethyl esters (LOVAZA) capsule 1 g, 1 g, Oral, Daily, Rise Patience, MD, 1 g at 10/07/19 0834 .  ondansetron (ZOFRAN)  tablet 4 mg, 4 mg, Oral, Q6H PRN **OR** ondansetron (ZOFRAN) injection 4 mg, 4 mg, Intravenous, Q6H PRN, Rise Patience, MD, 4 mg at 10/02/19 2115 .  pantoprazole (PROTONIX) EC tablet 40 mg, 40 mg, Oral, BID, Rise Patience, MD, 40 mg at 10/07/19 0834 .  rosuvastatin (CRESTOR) tablet 5 mg, 5 mg, Oral, q1800, Rise Patience, MD, 5 mg at 10/06/19 1729   Exam: Current vital signs: BP 104/62 (BP Location: Right Arm)   Pulse 95   Temp 98.2 F (36.8 C) (Oral)   Resp 16   Wt 45 kg   SpO2 99%   BMI 18.74 kg/m  Vital signs in last 24 hours: Temp:  [98.2 F (36.8 C)-98.4 F (36.9 C)] 98.2 F (36.8 C) (11/17 0938) Pulse Rate:  [94-101] 95 (11/17 1634) Resp:  [14-16] 16 (11/17 1634) BP: (101-110)/(58-62) 104/62 (11/17 1634) SpO2:  [98 %-99 %] 99 % (11/17 1634) Weight:  [45 kg] 45 kg (11/17 0517)  ROS:    General ROS: negative for - chills, fatigue, fever, night sweats, weight gain or weight loss Psychological ROS: Positive for - , memory difficulties Ophthalmic ROS: negative for - blurry vision, double vision, eye pain or loss of vision ENT ROS: negative for - epistaxis, nasal discharge, oral lesions, sore throat, tinnitus or vertigo Respiratory ROS: negative for - cough, hemoptysis, shortness of breath or wheezing Cardiovascular ROS: negative for - chest pain, dyspnea on exertion, edema or irregular heartbeat Gastrointestinal ROS: Positive for -  diarrhea, Genito-Urinary ROS: negative for - dysuria, hematuria, incontinence or urinary frequency/urgency Musculoskeletal ROS: negative for - joint swelling or muscular weakness Neurological ROS: as noted in HPI Dermatological ROS: negative for rash and skin lesion changes   Physical Exam  Constitutional: Appears well-developed and well-nourished.  Psych: Affect appropriate to situation Eyes: No scleral injection HENT: No OP obstrucion Head: Normocephalic.  Cardiovascular: Normal rate and regular rhythm.  Respiratory:  Effort normal, non-labored breathing GI: Soft.  No distension. There is no tenderness.  Skin: WDI  Neuro: Mental Status: Patient is alert.  She knows the year. When asked the month she perseverates on the year.  Cannot repeat phrases secondary to confusion.  Able to count fingers and name a glove.  She does not know what city or state that she is in.  When asked what the name of her thumb is she answers "yeah" when asked when she became confused at home she answered "I do not know" Cranial Nerves: II: Visual Fields are full. PERRL III,IV, VI: EOMI without ptosis or diplopia.  Esotropia is noted, with right eye deviated slightly medially relative to the fixating left eye.   V: Facial sensation is symmetric to temperature VII: Facial movement is symmetric.  VIII: hearing is intact to voice X: Palate elevates symmetrically XI: Shoulder shrug is symmetric. XII: tongue is midline without atrophy or fasciculations.  Motor: Tone is normal. Bulk is normal.  Left leg is 4+/5. Otherwise, she  has 5/5 strength in all other extremities  Sensory: Sensation is symmetric to light touch and temperature in the arms and legs. Deep Tendon Reflexes: 1+ and symmetric in the biceps and patellae.  Cerebellar: FNFintact bilaterally   Labs I have reviewed labs in epic and the results pertinent to this consultation are:   CBC    Component Value Date/Time   WBC 11.2 (H) 10/06/2019 0803   RBC 3.35 (L) 10/06/2019 0803   HGB 9.9 (L) 10/06/2019 0803   HCT 35.0 (L) 10/06/2019 0803   PLT 242 10/06/2019 0803   MCV 104.5 (H) 10/06/2019 0803   MCH 29.6 10/06/2019 0803   MCHC 28.3 (L) 10/06/2019 0803   RDW 16.5 (H) 10/06/2019 0803   LYMPHSABS 1.2 09/30/2019 1442   MONOABS 0.6 09/30/2019 1442   EOSABS 0.0 09/30/2019 1442   BASOSABS 0.0 09/30/2019 1442    CMP     Component Value Date/Time   NA 140 10/06/2019 0804   K 4.0 10/06/2019 0804   CL 102 10/06/2019 0804   CO2 26 10/06/2019 0804   GLUCOSE 120 (H)  10/06/2019 0804   BUN 60 (H) 10/06/2019 0804   CREATININE 8.07 (H) 10/06/2019 0804   CREATININE 7.04 (H) 09/18/2019 1138   CALCIUM 9.9 10/06/2019 0804   CALCIUM 9.0 12/10/2015 0915   PROT 5.0 (L) 10/02/2019 0530   ALBUMIN 1.6 (L) 10/06/2019 0804   AST 28 10/02/2019 0530   ALT 15 10/02/2019 0530   ALKPHOS 134 (H) 10/02/2019 0530   BILITOT 0.7 10/02/2019 0530   GFRNONAA 4 (L) 10/06/2019 0804   GFRNONAA 5 (L) 09/18/2019 1138   GFRAA 5 (L) 10/06/2019 0804   GFRAA 6 (L) 09/18/2019 1138    Lipid Panel     Component Value Date/Time   CHOL 231 (H) 06/24/2019 1216   TRIG 142 06/24/2019 1216   HDL 43 (L) 06/24/2019 1216   CHOLHDL 5.4 (H) 06/24/2019 1216   VLDL 29 06/15/2017 1028   LDLCALC 161 (H) 06/24/2019 1216     Imaging I have reviewed the images obtained:  CT-scan of the brain-mild diffuse cortical atrophy.  Mild chronic ischemic white matter disease.  No acute intracranial abnormality seen.  MRI examination of the brain-multiple small areas of acute infarct bilaterally in different vascular distributions, suggestive of multiple small emboli.  Etta Quill PA-C [facility] [phone] 10/07/2019, 4:55 PM   Assessment: 78 year old female who presented to the hospital with multiple days of confusion. Mentation did not improve as much as expected after 2 days of hemodialysis. MRI was obtained revealing multiple small areas of acute infarct bilaterally in different vascular distributions, suggestive of multiple small emboli.  1. Although cardioembolic mechanism is suspected for the strokes, the patient has not shown any atrial fibrillation on telemetry while in the hospital.  A TTE and possibly a TEE will be needed. May also need a loop recorder if echocardiography is unrevealing.  2. At this time patient does remain confused. Other than mild weakness of LLE, Neurological exam does not show any lateralizing features. 3. Stroke risk factors: DM, HLD and HTN    Recommendations: # MRA Head  # Carotid ultrasound  # A TTE and possibly a TEE will be needed. May also need a loop recorder if echocardiography is unrevealing.   # Increase ASA to 325 mg daily. May need to be switched to an oral anticoagulant pending results of additional imaging studies and telemetry.   # Switch rosuvastatin to atorvastatin 40 mg po qd.  #  BP goal: Normotensive. Out of permissive HTN time window. # HBAIC and Lipid profile # Telemetry monitoring # Frequent neuro checks # NPO until passes stroke swallow screen # Please page stroke NP  Or  PA  Or MD from 8am -4 pm  as this patient from this time will be  followed by the stroke.   You can look them up on [email or web address]  Password [email or web address]  I have seen and examined the patient. I have formulated the assessment and plan. 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 Exam reveals confusion and mild LLE weakness. Recommendations as above.  Electronically signed: Dr. Kerney Elbe

## 2019-10-07 NOTE — Progress Notes (Signed)
Physical Therapy Treatment Patient Details Name: Eileen Perez MRN: [id number] DOB: 09/01/1941 Today's Date: 10/07/2019    History of Present Illness 78 y.o. 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 Plan for dialysis catheter 11/13.    PT Comments    Patient received in bed, pleasantly confused and willing to work with PT today. Able to complete bed mobility, functional transfers, and gait approximately 200ft with RW and S today with cues for safety. Continues to require cues for RW management and remains fatigable. Continue to note cognitive impairments that would prevent her from living alone safely- required cues for navigation in hall as she kept trying to enter rooms that were not hers. She declines chair today, stating "I already sat up and already got back". She was left in bed with all needs met, bed alarm active.     Follow Up Recommendations  SNF     Equipment Recommendations  None recommended by PT    Recommendations for Other Services       Precautions / Restrictions Precautions Precautions: Fall Restrictions Weight Bearing Restrictions: No    Mobility  Bed Mobility Overal bed mobility: Needs Assistance Bed Mobility: Supine to Sit     Supine to sit: Supervision Sit to supine: Supervision   General bed mobility comments: Supervision for safety.  Transfers Overall transfer level: Needs assistance Equipment used: Rolling walker (2 wheeled) Transfers: Sit to/from Stand Sit to Stand: Supervision         General transfer comment: S for safety, cues for hand placement, extended time  Ambulation/Gait Ambulation/Gait assistance: Supervision Gait Distance (Feet): 200 Feet Assistive device:  Rolling walker (2 wheeled) Gait Pattern/deviations: Step-through pattern;Decreased stride length Gait velocity: decreased   General Gait Details: Slow, mostly steady gait with cues for RW management esp with turns. Fatigues.   Stairs             Wheelchair Mobility    Modified Rankin (Stroke Patients Only)       Balance Overall balance assessment: Needs assistance Sitting-balance support: Bilateral upper extremity supported;Feet supported Sitting balance-Leahy Scale: Normal     Standing balance support: During functional activity Standing balance-Leahy Scale: Fair Standing balance comment: UE support for balance                            Cognition Arousal/Alertness: Awake/alert Behavior During Therapy: Flat affect Overall Cognitive Status: Impaired/Different from baseline Area of Impairment: Orientation;Memory;Safety/judgement;Attention                 Orientation Level: Disoriented to;Place;Time;Situation Current Attention Level: Sustained Memory: Decreased short-term memory Following Commands: Follows one step commands consistently Safety/Judgement: Decreased awareness of safety;Decreased awareness of deficits Awareness: Intellectual Problem Solving: Slow processing;Requires verbal cues General Comments: remains flat but did smile occasionally with therapist today, when in hall needed Mod cues for navigation as she kept trying to enter rooms that were not hers      Exercises      General Comments        Pertinent Vitals/Pain Pain Assessment: Faces Pain Score: 0-No pain Faces Pain Scale: No hurt Pain Intervention(s): Limited activity within patient's tolerance;Monitored during session    Home Living                        Prior Function            PT Goals (current goals can now be found in the care plan section) Acute Rehab PT Goals Patient Stated Goal: non stated PT Goal Formulation: With patient/family Time For Goal  Achievement: 10/14/19 Potential to Achieve Goals: Good Progress towards PT goals: Progressing toward goals    Frequency    Min 2X/week      PT Plan Current plan remains appropriate    Co-evaluation              AM-PAC PT "6 Clicks" Mobility   Outcome Measure  Help needed turning from your back to your side while in a flat bed without using bedrails?: None Help needed moving from lying on your back to sitting on the side of a flat bed without using bedrails?: None Help needed moving to and from a bed to a chair (including a wheelchair)?: A Little Help needed standing up from a chair using your arms (e.g., wheelchair or bedside chair)?: A Little Help needed to walk in hospital room?: A Little Help needed climbing 3-5 steps with a railing? : A Little 6 Click Score: 20    End of Session   Activity Tolerance: Patient tolerated treatment well Patient left: in bed;with call bell/phone within reach;with bed alarm set   PT Visit Diagnosis: Muscle weakness (generalized) (M62.81);Other symptoms and signs involving the nervous system (R29.898)     Time: 3546-5681 PT Time Calculation (min) (ACUTE ONLY): 15 min  Charges:  $Gait Training: 8-22 mins                     Windell Norfolk, DPT, PN1   Supplemental Physical Therapist [place]    Pager [phone] Acute Rehab Office [phone]

## 2019-10-07 NOTE — Progress Notes (Addendum)
PROGRESS NOTE    Eileen Perez   ZOX:[id number]  DOB: 1941/02/16  DOA: 09/30/2019 PCP: Unk Pinto, MD   Brief Narrative:  Eileen Perez the patient is a 78 y.o. year-old who lives alone with ESRD on peritoneal dialysis, gastric carcinoid, gout, DM2   presenting to ED with confusion. She lives alone. Her grand daugther noted confusion over the weekend and her [facility] went to check on her on Tuesday and noted severe confusion. Her daughter [name] does not live with her tells me that a dialysis RN or a family member were helping with her dialysis. She was also eating poorly this past week.   In ED> head CT unrevealing, Ammonia level negative   Subjective: No new complaints today.   Assessment & Plan:   Principal Problem:   Acute metabolic encephalopathy -per daughter,  the patient was sharp enough to do her own dialysis at home but has been confused about 1 wk prior to admission - B12 is 778 , ammonia 16-   -  thought to be due to inability to do dialysis properly at home and subsequent uremia - mental status has improve daily however, she remains confused after 2 HD treatments -  obtaining and MRI today - Addendum- MRI>  Multiple small areas of acute infarction bilaterally in different vascular distributions, suggestive of multiple small emboli. - ? If this is embolic- Telemetry has not shown any A-fib- already on ASA 81 mg - Have asked for neuro eval and updated her daughter    Active Problems:    ESRD (end stage renal disease) on peritoneal dialysis since 2017  SHPT - appreciate nephrology assisting with management- HD started 11/13- initially thought that she would continue to need HD - palliative care consulted by nephrology to help make further decisions  - Dr Clover Mealy wrote in her note today that she feels that if family can do the PD for her, she would not need HD-   Diarrhea - Recent C diff colitis - abdomen non-tender, no leukocytosis of fevers thus likely not  infectious - d/c'd Nepro- diarrhea has improved   Anemia of chronic disease - stable  Gout - cont allopurinol  Weight loss - per daughter she lost about 13 lb but has recently gained some back  Hypoalbuminemia - ? If related to recent C diff infection and resultant poor oral intake  DM2 - last A1c 6.5 in 4/20- not on medications at home so I suspect it is diet controlled- sugars have been normal and thus d/c'd  fingerstick's.  HLD Crestor  Carcinoid tumor of stomach   Time spent in minutes: 35  DVT prophylaxis: Heparin Code Status: Full code Family Communication:  Daughter Special educational needs teacher-  family would like to take her home and provide 24 hr care including peritoneal dialysis rather than send her to SNF- they are making arrangements Disposition Plan: If indeed, she can have peritoneal dialysis by a family member, I feel she can be discharged in a couple of days after neuro work up is completed.  Consultants:   Nephrology  Palliative care  IR Procedures:   HD cath  Antimicrobials:  Anti-infectives (From admission, onward)   Start     Dose/Rate Route Frequency Ordered Stop   10/03/19 1403  ceFAZolin (ANCEF) 2-4 GM/100ML-% IVPB    Note to Pharmacy: Manuela Neptune   : cabinet override      10/03/19 1403 10/03/19 1407   10/03/19 0000  ceFAZolin (ANCEF) IVPB 2g/100 mL premix     2  g 200 mL/hr over 30 Minutes Intravenous  Once 10/02/19 1703 10/03/19 0249   10/02/19 1530  ceFAZolin (ANCEF) IVPB 2g/100 mL premix  Status:  Discontinued     2 g 200 mL/hr over 30 Minutes Intravenous  Once 10/02/19 1518 10/02/19 1703   10/01/19 0800  vancomycin (VANCOCIN) 50 mg/mL oral solution 125 mg  Status:  Discontinued     125 mg Oral 4 times daily 10/01/19 0631 10/01/19 1313       Objective: Vitals:   10/06/19 1633 10/06/19 2123 10/07/19 0517 10/07/19 0938  BP: (!) 91/48 101/60 (!) 110/58 106/61  Pulse: 99 94 (!) 101 94  Resp: 18 15 14 16   Temp: 99.2 F (37.3 C) 98.4 F (36.9 C)  98.4 F (36.9 C) 98.2 F (36.8 C)  TempSrc: Oral Oral Oral Oral  SpO2: 92% 98% 98% 98%  Weight:   45 kg     Intake/Output Summary (Last 24 hours) at 10/07/2019 1121 Last data filed at 10/07/2019 0900 Gross per 24 hour  Intake 360 ml  Output 0 ml  Net 360 ml   Filed Weights   10/06/19 0700 10/06/19 1044 10/07/19 0517  Weight: 45.8 kg 45.6 kg 45 kg    Examination: General exam: Appears comfortable  HEENT: PERRLA, oral mucosa moist, no sclera icterus or thrush Respiratory system: Clear to auscultation. Respiratory effort normal. Cardiovascular system: S1 & S2 heard,  No murmurs  Gastrointestinal system: Abdomen soft, non-tender, nondistended. Normal bowel sounds  - PD cath in place Central nervous system: Alert and oriented to person and place- not time. No focal neurological deficits. Extremities: No cyanosis, clubbing or edema Skin: No rashes or ulcers Psychiatry:  Mood & affect appropriate.    Data Reviewed: I have personally reviewed following labs and imaging studies  CBC: Recent Labs  Lab 09/30/19 1442 10/01/19 0336 10/02/19 0530 10/03/19 0434 10/06/19 0803  WBC 13.6* 16.3* 11.4* 9.0 11.2*  NEUTROABS 11.6*  --   --   --   --   HGB 11.0* 10.5* 10.4* 9.5* 9.9*  HCT 37.5 35.7* 35.7* 32.9* 35.0*  MCV 101.9* 101.4* 102.3* 101.9* 104.5*  PLT 293 297 255 301 478   Basic Metabolic Panel: Recent Labs  Lab 09/30/19 1442 10/01/19 0336 10/02/19 0530 10/03/19 0434 10/05/19 0741 10/06/19 0804  NA 135 135 134* 135 138 140  K 4.5 5.3* 4.7 4.0 4.0 4.0  CL 95* 95* 94* 92* 100 102  CO2 26 27 25 26 26 26   GLUCOSE 152* 103* 171* 168* 119* 120*  BUN 32* 36* 40* 40* 34* 60*  CREATININE 7.30* 8.50* 8.39* 7.97* 6.20* 8.07*  CALCIUM 10.6* 10.6* 10.4* 10.3 10.3 9.9  MG 3.2*  --   --   --   --   --   PHOS 2.2*  --   --   --   --  4.5   GFR: Estimated Creatinine Clearance: 4 mL/min (A) (by C-G formula based on SCr of 8.07 mg/dL (H)). Liver Function Tests: Recent Labs   Lab 09/30/19 1442 10/02/19 0530 10/06/19 0804  AST 16 28  --   ALT 14 15  --   ALKPHOS 141* 134*  --   BILITOT 0.3 0.7  --   PROT 5.4* 5.0*  --   ALBUMIN 1.9* 1.7* 1.6*   No results for input(s): LIPASE, AMYLASE in the last 168 hours. Recent Labs  Lab 09/30/19 1529 10/02/19 0530  AMMONIA 16 24   Coagulation Profile: Recent Labs  Lab 09/30/19 1442  INR 0.9   Cardiac Enzymes: No results for input(s): CKTOTAL, CKMB, CKMBINDEX, TROPONINI in the last 168 hours. BNP (last 3 results) No results for input(s): PROBNP in the last 8760 hours. HbA1C: No results for input(s): HGBA1C in the last 72 hours. CBG: Recent Labs  Lab 10/05/19 2028 10/06/19 1210 10/06/19 1725 10/06/19 2123 10/07/19 0818  GLUCAP 152* 88 96 138* 157*   Lipid Profile: No results for input(s): CHOL, HDL, LDLCALC, TRIG, CHOLHDL, LDLDIRECT in the last 72 hours. Thyroid Function Tests: No results for input(s): TSH, T4TOTAL, FREET4, T3FREE, THYROIDAB in the last 72 hours. Anemia Panel: No results for input(s): VITAMINB12, FOLATE, FERRITIN, TIBC, IRON, RETICCTPCT in the last 72 hours. Urine analysis:    Component Value Date/Time   COLORURINE AMBER (A) 10/02/2019 1600   APPEARANCEUR CLOUDY (A) 10/02/2019 1600   LABSPEC 1.012 10/02/2019 1600   PHURINE 6.0 10/02/2019 1600   GLUCOSEU 50 (A) 10/02/2019 1600   HGBUR MODERATE (A) 10/02/2019 1600   BILIRUBINUR NEGATIVE 10/02/2019 1600   KETONESUR NEGATIVE 10/02/2019 1600   PROTEINUR >=300 (A) 10/02/2019 1600   UROBILINOGEN 0.2 05/19/2015 0942   NITRITE NEGATIVE 10/02/2019 1600   LEUKOCYTESUR LARGE (A) 10/02/2019 1600   Sepsis Labs: @LABRCNTIP (procalcitonin:4,lacticidven:4) ) Recent Results (from the past 240 hour(s))  SARS CORONAVIRUS 2 (TAT 6-24 HRS) Nasopharyngeal Nasopharyngeal Swab     Status: None   Collection Time: 09/30/19  7:10 PM   Specimen: Nasopharyngeal Swab  Result Value Ref Range Status   SARS Coronavirus 2 NEGATIVE NEGATIVE Final     Comment: (NOTE) SARS-CoV-2 target nucleic acids are NOT DETECTED. The SARS-CoV-2 RNA is generally detectable in upper and lower respiratory specimens during the acute phase of infection. Negative results do not preclude SARS-CoV-2 infection, do not rule out co-infections with other pathogens, and should not be used as the sole basis for treatment or other patient management decisions. Negative results must be combined with clinical observations, patient history, and epidemiological information. The expected result is Negative. Fact Sheet for Patients: SugarRoll.be Fact Sheet for Healthcare Providers: https://www.woods-mathews.com/ This test is not yet approved or cleared by the [place] FDA and  has been authorized for detection and/or diagnosis of SARS-CoV-2 by FDA under an Emergency Use Authorization (EUA). This EUA will remain  in effect (meaning this test can be used) for the duration of the COVID-19 declaration under Section 56 4(b)(1) of the Act, 21 U.S.C. section 360bbb-3(b)(1), unless the authorization is terminated or revoked sooner. Performed at [facility], [place] [address] [place], [place] [address]   Surgical pcr screen     Status: None   Collection Time: 10/01/19  1:44 AM   Specimen: Nasal Mucosa; Nasal Swab  Result Value Ref Range Status   MRSA, PCR NEGATIVE NEGATIVE Final   Staphylococcus aureus NEGATIVE NEGATIVE Final    Comment: (NOTE) The Xpert SA Assay (FDA approved for NASAL specimens in patients 43 years of age and older), is one component of a comprehensive surveillance program. It is not intended to diagnose infection nor to guide or monitor treatment. Performed at [facility], [place] [address] [place], [place] [address]          Radiology Studies: No results found.    Scheduled Meds: . allopurinol  300 mg Oral Daily  . aspirin EC  81 mg Oral QHS  . Chlorhexidine Gluconate Cloth  6  each Topical Daily  . Chlorhexidine Gluconate Cloth  6 each Topical Q0600  . darbepoetin (ARANESP) injection -  DIALYSIS  40 mcg Intravenous Q Mon-HD  . feeding supplement (NEPRO CARB STEADY)  237 mL Oral TID BM  . feeding supplement (PRO-STAT SUGAR FREE 64)  30 mL Oral BID  . insulin aspart  0-9 Units Subcutaneous TID WC  . lidocaine  15 mL Mouth/Throat TID AC  . multivitamin  1 tablet Oral QHS  . omega-3 acid ethyl esters  1 g Oral Daily  . pantoprazole  40 mg Oral BID  . rosuvastatin  5 mg Oral q1800   Continuous Infusions: . dialysis solution 1.5% low-MG/low-CA    . ferric gluconate (FERRLECIT/NULECIT) IV Stopped (10/06/19 1041)     LOS: 6 days      Debbe Odea, MD [facility] Pager: [email or web address] Password [email or web address] 10/07/2019, 11:21 AM

## 2019-10-07 NOTE — Progress Notes (Signed)
Renal Navigator was notified by Renal PA/D. Zeyfang that Eileen Perez needs OP HD referral for modality change from PD to HD. Renal Navigator reviewed medical record, especially PMT notes, which state considerable concerns by family and Eileen Perez with a permanent transition from PD to HD. Navigator discussed case briefly with Dr. Goldsborough/Nephrology before speaking with Eileen Perez or calling Eileen Perez's daughter. Dr. Moshe Cipro stated that there may be a possibility that Eileen Perez could continue with PD if she has a support person with her to administer the treatment for her. Renal Navigator also notes that PT/OT are recommending SNF or 24/7 supervision.  Renal Navigator attempted to meet with Eileen Perez at bedside, but she was on the phone. Renal Navigator notes phone calls to Eileen Perez's daughter/[name] Fabio Neighbors and that Ms. Fabio Neighbors is listed as ER Contact on Eileen Perez's chart and contacted Ms. Fabio Neighbors by phone. Renal Navigator had a 35 minute conversation with Eileen Perez's daughter about Eileen Perez's family, hard decisions, and ultimately, that everyone wants what is best for Eileen Perez. She reports that Eileen Perez has two sisters and a brother, and two sons and a daughter who are all collaborating to help Eileen Perez make decisions. She reports that she really feels they are all acting in love and Eileen Perez's best interest. She states that Eileen Perez recently lost her older sister and no one was asked to be involved and Ms. Fabio Neighbors feels that this hurt her mother very much.  Ms. Fabio Neighbors spoke about reading the book "Hard Decisions" given to her by PMT and sharing it with her family. She states they are still in the process of having conversations, but feel that a good possibility for her mother is to move to her brother/Telorian's home in [place] (Eileen Perez's son). Ms. Fabio Neighbors states that her mother would have 24 hour supervision here and someone to assist with PD. We discussed SNF recommendation, and although she listed to why SNF would be  recommended, she states she cannot fathom her mother in isolation without visitors being allowed and does not want to pursue SNF placement. She is also worried about the COVID cases within SNF facilities. Renal Navigator validated her concerns. She is interested in having [facility] for her mother, as she had after her last hospitalization and asked if this could be put in place at her brother's home in [name]. Navigator will discuss this with CM/CSW if taking Eileen Perez to [facility] becomes the plan. Renal Navigator discussed PD vs HD even if Eileen Perez goes to [facility] and that Dr. Moshe Cipro is evaluating whether she feels this is a viable option for Eileen Perez. Renal Navigator informed Ms. Fabio Neighbors that HD can be arranged for Eileen Perez in [name]. She states she just is not sure that in-center HD aligns with her mother's idea of quality of life from what she understands of in-center HD. She states that her mother enjoys calm and peace. Renal Navigator explained that in-center HD is 4 hour treatments 3 times per week surrounded by many people, and again, Renal Navigator normalized and validated what she was expressing about what she wishes for her mother and her idea of quality of life. Ms. Fabio Neighbors states that it is "completely unlike my mother to be confused." She asked if Navigator feels that her mother's confusion is getting better. Navigator reported inability to answer this first hand, but it does seem that notes are conveying this. She states she feels this way when she speaks to her mother. She asked Navigator if it is the HD that is helping clear up her mother's confusion. Renal Navigator reported that  this was a good question for the MD, but that it could be. She states she knows that HD is more of an aggressive treatment than PD. Ms. Fabio Neighbors seems to have a very good grasp of the situation and is taking in everything that she is being told. She truly appears to be appreciative of the assistance of staff and care her mother is  receiving. She wants what is best for her mother and it is clear that Eileen Perez is adored by her family. Ms. Fabio Neighbors reports that her daughter (Eileen Perez's granddaughter) is planning to visit Eileen Perez this weekend, especially to read scripture with her. She wanted to know if more than one visitor would be allowed, because her brother (Eileen Perez's son)/Telorian would also like to plan a visit this weekend in order to meet with the medical staff and have a face to face meeting with PMT to further discuss [place]. Renal Navigator will discuss with Nursing Director and call Ms. Fabio Neighbors back. Renal Navigator informed CSW that family does not want to pursue SNF and called Nephrologist about significant possibility that Eileen Perez may discharge to son's home when medically stable, wishing to resume PD if medically able.  Renal Navigator to continue to follow closely for needs and support.  Alphonzo Cruise,  Renal Navigator [phone]

## 2019-10-08 ENCOUNTER — Inpatient Hospital Stay (HOSPITAL_COMMUNITY): Payer: Medicare Other

## 2019-10-08 ENCOUNTER — Encounter (HOSPITAL_COMMUNITY): Payer: Medicare Other

## 2019-10-08 DIAGNOSIS — R82998 Other abnormal findings in urine: Secondary | ICD-10-CM | POA: Diagnosis not present

## 2019-10-08 DIAGNOSIS — E119 Type 2 diabetes mellitus without complications: Secondary | ICD-10-CM | POA: Diagnosis not present

## 2019-10-08 DIAGNOSIS — Z23 Encounter for immunization: Secondary | ICD-10-CM | POA: Diagnosis not present

## 2019-10-08 DIAGNOSIS — N2581 Secondary hyperparathyroidism of renal origin: Secondary | ICD-10-CM | POA: Diagnosis not present

## 2019-10-08 DIAGNOSIS — D631 Anemia in chronic kidney disease: Secondary | ICD-10-CM | POA: Diagnosis not present

## 2019-10-08 DIAGNOSIS — I6340 Cerebral infarction due to embolism of unspecified cerebral artery: Secondary | ICD-10-CM

## 2019-10-08 DIAGNOSIS — N186 End stage renal disease: Secondary | ICD-10-CM | POA: Diagnosis not present

## 2019-10-08 DIAGNOSIS — G9340 Encephalopathy, unspecified: Secondary | ICD-10-CM

## 2019-10-08 DIAGNOSIS — I6389 Other cerebral infarction: Secondary | ICD-10-CM

## 2019-10-08 DIAGNOSIS — D509 Iron deficiency anemia, unspecified: Secondary | ICD-10-CM | POA: Diagnosis not present

## 2019-10-08 DIAGNOSIS — Z992 Dependence on renal dialysis: Secondary | ICD-10-CM | POA: Diagnosis not present

## 2019-10-08 DIAGNOSIS — N2589 Other disorders resulting from impaired renal tubular function: Secondary | ICD-10-CM | POA: Diagnosis not present

## 2019-10-08 LAB — HEMOGLOBIN A1C
Hgb A1c MFr Bld: 5 % (ref 4.8–5.6)
Mean Plasma Glucose: 96.8 mg/dL

## 2019-10-08 LAB — RENAL FUNCTION PANEL
Albumin: 1.5 g/dL — ABNORMAL LOW (ref 3.5–5.0)
Albumin: 1.6 g/dL — ABNORMAL LOW (ref 3.5–5.0)
Anion gap: 15 (ref 5–15)
Anion gap: 13 (ref 5–15)
BUN: 50 mg/dL — ABNORMAL HIGH (ref 8–23)
BUN: 59 mg/dL — ABNORMAL HIGH (ref 8–23)
CO2: 26 mmol/L (ref 22–32)
CO2: 26 mmol/L (ref 22–32)
Calcium: 9.6 mg/dL (ref 8.9–10.3)
Calcium: 9.6 mg/dL (ref 8.9–10.3)
Chloride: 102 mmol/L (ref 98–111)
Chloride: 103 mmol/L (ref 98–111)
Creatinine, Ser: 6.36 mg/dL — ABNORMAL HIGH (ref 0.44–1.00)
Creatinine, Ser: 6.85 mg/dL — ABNORMAL HIGH (ref 0.44–1.00)
GFR calc Af Amer: 7 mL/min — ABNORMAL LOW (ref >60)
GFR calc Af Amer: 6 mL/min — ABNORMAL LOW (ref >60)
GFR calc non Af Amer: 6 mL/min — ABNORMAL LOW (ref >60)
GFR calc non Af Amer: 5 mL/min — ABNORMAL LOW (ref >60)
Glucose, Bld: 117 mg/dL — ABNORMAL HIGH (ref 70–99)
Glucose, Bld: 132 mg/dL — ABNORMAL HIGH (ref 70–99)
Phosphorus: 2.9 mg/dL (ref 2.5–4.6)
Phosphorus: 3.2 mg/dL (ref 2.5–4.6)
Potassium: 4.3 mmol/L (ref 3.5–5.1)
Potassium: 4.3 mmol/L (ref 3.5–5.1)
Sodium: 143 mmol/L (ref 135–145)
Sodium: 142 mmol/L (ref 135–145)

## 2019-10-08 LAB — CBC
HCT: 31.5 % — ABNORMAL LOW (ref 36.0–46.0)
HCT: 32.2 % — ABNORMAL LOW (ref 36.0–46.0)
Hemoglobin: 9 g/dL — ABNORMAL LOW (ref 12.0–15.0)
Hemoglobin: 9.1 g/dL — ABNORMAL LOW (ref 12.0–15.0)
MCH: 29.5 pg (ref 26.0–34.0)
MCH: 29.5 pg (ref 26.0–34.0)
MCHC: 28.6 g/dL — ABNORMAL LOW (ref 30.0–36.0)
MCHC: 28.3 g/dL — ABNORMAL LOW (ref 30.0–36.0)
MCV: 103.3 fL — ABNORMAL HIGH (ref 80.0–100.0)
MCV: 104.5 fL — ABNORMAL HIGH (ref 80.0–100.0)
Platelets: 183 10*3/uL (ref 150–400)
Platelets: 193 10*3/uL (ref 150–400)
RBC: 3.05 MIL/uL — ABNORMAL LOW (ref 3.87–5.11)
RBC: 3.08 MIL/uL — ABNORMAL LOW (ref 3.87–5.11)
RDW: 16.3 % — ABNORMAL HIGH (ref 11.5–15.5)
RDW: 16.4 % — ABNORMAL HIGH (ref 11.5–15.5)
WBC: 12.8 10*3/uL — ABNORMAL HIGH (ref 4.0–10.5)
WBC: 13.3 10*3/uL — ABNORMAL HIGH (ref 4.0–10.5)
nRBC: 0.4 % — ABNORMAL HIGH (ref 0.0–0.2)
nRBC: 0.7 % — ABNORMAL HIGH (ref 0.0–0.2)

## 2019-10-08 LAB — LIPID PANEL
Cholesterol: 77 mg/dL (ref 0–200)
HDL: 29 mg/dL — ABNORMAL LOW (ref >40)
LDL Cholesterol: 20 mg/dL (ref 0–99)
Total CHOL/HDL Ratio: 2.7 RATIO
Triglycerides: 141 mg/dL (ref <150)
VLDL: 28 mg/dL (ref 0–40)

## 2019-10-08 LAB — ECHOCARDIOGRAM COMPLETE: Weight: 1608.48 oz

## 2019-10-08 LAB — GLUCOSE, CAPILLARY
Glucose-Capillary: 110 mg/dL — ABNORMAL HIGH (ref 70–99)
Glucose-Capillary: 132 mg/dL — ABNORMAL HIGH (ref 70–99)
Glucose-Capillary: 86 mg/dL (ref 70–99)
Glucose-Capillary: 125 mg/dL — ABNORMAL HIGH (ref 70–99)

## 2019-10-08 MED ORDER — HEPARIN SODIUM (PORCINE) 1000 UNIT/ML IJ SOLN
INTRAMUSCULAR | Status: AC
  Administered 2019-10-08: 3200 [IU] via INTRAVENOUS_CENTRAL
  Filled 2019-10-08: qty 1

## 2019-10-08 NOTE — Procedures (Signed)
Patient was seen on dialysis and the procedure was supervised.  BFR 350  Via TDC BP is  98/62.   Patient appears to be tolerating treatment well  Eileen Perez 10/08/2019

## 2019-10-08 NOTE — Progress Notes (Signed)
Daily Progress Note   Patient Name: Eileen Perez       Date: 10/08/2019 DOB: 03-08-1941  Age: 78 y.o. MRN#: [id number] Attending Physician: Verlee Monte, MD Primary Care Physician: Unk Pinto, MD Admit Date: 09/30/2019  Reason for Consultation/Follow-up: Establishing goals of care and Psychosocial/spiritual support  Subjective: Patient OTF in dialysis during visit.  GOC:  Received call from patient's daughter, [name] via telephone to discuss plan of care/goals of care for her mother.   [name] is very [place] attorney of discussions with nephrology team including MD and social work. She confirms understanding that patient will need to continue hemodialysis once moved to [place].   [name] explains that her daughter (patient's granddaughter) plans to arrive to [place] on Saturday. [name] is hopeful that her mother could remain inpatient until Sunday/Monday. The family does NOT want Lochlyn to discharge to SNF following hospitalization. They would like her to return home with granddaughter until arrangements are made in [place], for which family will then move her down to [place] with patient's son.   Discussed hospital diagnoses and plan of care including expectations following mini-strokes and neurology workup.   Follow-up discussion regarding advance directives and code status. Patient has documented HCPOA's--her brother Winferd Humphrey), sister Janae Bridgeman), and daughter [name]). [name] reports that Winferd Humphrey and Janae Bridgeman defer decisions to her and her brothers. [name] and brothers and in frequent communication with Winferd Humphrey and [place]. They have a phone conference this evening to talk about code status.   [name] asks questions about code status. Medically recommended against  resuscitation/heroic measures at EOL with underlying age, frailty, chronic conditions, and poor chance of survival/meaningful recovery if her mother required resuscitation. [name] appreciates Hard Choices copy that was emailed by Haynes Dage, PMT PA. Introduced and discussed MOST form and consideration of limitations to care (DNR/DNI, NO feeding tube) but treating the treatable including ABX/IVF if necessary. Also continuing dialysis as this point. Sent [name] a copy of MOST form to review in her discussion with family this evening. Discussed 'hoping for the best, but also preparing for the worst' and considering limitations to care.   Adelaide's quality of life is most important to family.  [name] requests PMT provider follow-up on Friday. This gives her one day to further discuss code status and plan with family.   Answered all questions and concerns to the best of  my ability. [name] has PMT contact information and very appreciative of discussion.    Length of Stay: 7  Current Medications: Scheduled Meds:  . allopurinol  300 mg Oral Daily  . aspirin EC  81 mg Oral QHS  . Chlorhexidine Gluconate Cloth  6 each Topical Daily  . Chlorhexidine Gluconate Cloth  6 each Topical Q0600  . darbepoetin (ARANESP) injection - DIALYSIS  40 mcg Intravenous Q Mon-HD  . feeding supplement (NEPRO CARB STEADY)  237 mL Oral TID BM  . feeding supplement (PRO-STAT SUGAR FREE 64)  30 mL Oral BID  . insulin aspart  0-9 Units Subcutaneous TID WC  . lidocaine  15 mL Mouth/Throat TID AC  . multivitamin  1 tablet Oral QHS  . omega-3 acid ethyl esters  1 g Oral Daily  . pantoprazole  40 mg Oral BID  . rosuvastatin  5 mg Oral q1800    Continuous Infusions: . dialysis solution 1.5% low-MG/low-CA      PRN Meds: acetaminophen **OR** acetaminophen, dianeal solution for CAPD/CCPD with heparin, heparin, ondansetron **OR** ondansetron (ZOFRAN) IV  Physical Exam : Pt OTF during visit. In dialysis.        Vital  Signs: BP 120/74 (BP Location: Left Arm)   Pulse 97   Temp 98.4 F (36.9 C) (Oral)   Resp 18   Wt 46.9 kg   SpO2 99%   BMI 19.54 kg/m  SpO2: SpO2: 99 % O2 Device: O2 Device: Room Air O2 Flow Rate: O2 Flow Rate (L/min): 2 L/min  Intake/output summary:   Intake/Output Summary (Last 24 hours) at 10/08/2019 1704 Last data filed at 10/08/2019 0601 Gross per 24 hour  Intake 237 ml  Output 0 ml  Net 237 ml   LBM: Last BM Date: 10/06/19 Baseline Weight: Weight: 44.8 kg Most recent weight: Weight: 46.9 kg       Palliative Assessment/Data:  50%    Flowsheet Rows     Most Recent Value  Intake Tab  Referral Department  Hospitalist  Unit at Time of Referral  Cardiac/Telemetry Unit  Palliative Care Primary Diagnosis  Neurology  Date Notified  10/01/19  Palliative Care Type  New Palliative care  Reason for referral  Clarify Goals of Care  Date of Admission  09/30/19  Date first seen by Palliative Care  10/02/19  # of days Palliative referral response time  1 Day(s)  # of days IP prior to Palliative referral  1  Clinical Assessment  Psychosocial & Spiritual Assessment  Palliative Care Outcomes      Patient Active Problem List   Diagnosis Date Noted  . Cerebral embolism with cerebral infarction 10/07/2019  . Palliative care encounter   . Acute encephalopathy 09/30/2019  . Intractable nausea and vomiting 09/11/2019  . Diarrhea 09/11/2019  . Hypomagnesemia 09/11/2019  . Pressure injury of skin 09/11/2019  . Clostridioides difficile infection 09/11/2019  . Dehydration   . Hypotension 09/10/2019  . Esophagitis, [place] grade B 09/06/2019  . Gastritis 09/06/2019  . Protein-calorie malnutrition, severe 08/29/2019  . Small bowel obstruction ([place]) 08/28/2019  . Schatzki's ring of distal esophagus 06/23/2019  . 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 History of colon polyps 06/23/2019  . Enrolled in chronic care management 02/20/2019  . Aortic atherosclerosis ([place])  02/13/2018  . T2_NIDDM w/ESRD ([place]) 05/19/2016  . Overweight (BMI 25.0-29.9) 10/31/2015  . ESRD (end stage renal disease) ([place]) 10/31/2015  . OA (osteoarthritis) of hip 05/26/2015  . Secondary hyperparathyroidism ([facility]) 04/16/2014  . Vitamin  D deficiency 04/16/2014  . Hyperlipidemia, mixed 04/16/2014  . Medication management 04/16/2014  . Anemia of chronic Renal Dz   . Pancreatic cyst   . GERD   . Gout   . Osteopenia   . Essential hypertension 06/10/2013  . DJD 06/09/2013    Palliative Care Plan   Patient Profile/HPI:  78 y.o. female  with past medical history of ESRD on PD (since 2017), anemia, GERD, gout, prediabetes, gastric carcinoid )on surveillance with GI), recent C. diff infection  admitted on 09/30/2019 with increased confusion of unknown etiology. Could be a uremia factor to confusion with plans for hemodialysis trial. Also plans for MRI head.   Assessment: ESRD on dialysis Acute metabolic encephalopathy Multiple small areas of acute infarction on MRI Anemia of chronic disease Recent c. Diff infection DM2  Recommendations/Plan:  Continue FULL Code/FULL scope treatment. Family to have conference call this evening to discuss advance directives/code status. Educated on medical recommendation against heroic measures at EOL and consideration of completion of MOST form.   Granddaughter to arrive to [place] on Saturday. Family does NOT want her discharging to SNF. Family wishes for her to discharge home with granddaughter until arrangements can be made to move her to [place] with son.   Continue hemodialysis. Appreciate nephrology discussions with family.   Stroke work-up per neurology.   May benefit from outpatient palliative referral.    Goals of Care and Additional Recommendations:  Limitations on Scope of Treatment: Full Scope Treatment  Code Status:  Full code  Prognosis:   Unable to determine.     Discharge Planning:  To Be Determined  Care plan was  discussed with daughter Jake Shark)  Thank you for allowing the Palliative Medicine Team to assist in the care of this patient.  Total time spent:  35 min.     Greater than 50%  of this time was spent counseling and coordinating care related to the above assessment and plan.  Ihor Dow, DNP, FNP-C Palliative Medicine Team  Phone: [phone] Fax: [phone]  Please contact Palliative MedicineTeam phone at [phone] for questions and concerns between 7 am - 7 pm.   Please see AMION for individual provider pager numbers.

## 2019-10-08 NOTE — Progress Notes (Signed)
[facility] PROGRESS NOTE  Eileen Perez MVH:[id number] DOB: 04-16-1941 DOA: 09/30/2019 PCP: Unk Pinto, MD   Subjective    Brief history  Eileen Perez the patient is a78 y.o.year-old who lives alone with ESRD on peritoneal dialysis, gastric carcinoid, gout, DM2   presenting to ED with confusion. She lives alone. Her grand daugther noted confusion over the weekend and her [facility] went to check on her on Tuesday and noted severe confusion. Her daughter [name] does not live with her tells me that a dialysis RN or a family member were helping with her dialysis. She was also eating poorly this past week.   In ED> head CT unrevealing, Ammonia level negative  Assessment and plan   Principal Problem:   Acute encephalopathy Active Problems:   Essential hypertension   Anemia of chronic Renal Dz   ESRD (end stage renal disease) (HCC)   Carcinoid tumor of stomach   Palliative care encounter   Cerebral embolism with cerebral infarction    Acute CVA Patient was presented with acute confusional state. MRI showed multiple small areas of acute infarction bilaterally. Neurology consulted, stroke work-up is started. Aspirin increased to full dose, follow telemetry  Acute metabolic encephalopathy This is likely secondary to acute CVA B12, folate and TSH WNL.    ESRD (end stage renal disease) on peritoneal dialysis since 2017  SHPT - appreciate nephrology assisting with management- HD started 11/13- initially thought that she would continue to need HD - palliative care consulted by nephrology to help make further decisions Family willing to take her home when help with peritoneal dialysis for her.  Diarrhea - Recent C diff colitis - abdomen non-tender, no leukocytosis of fevers thus likely not infectious - d/c'd Nepro- diarrhea has improved   Anemia of chronic disease - stable  Gout - cont allopurinol  Weight loss - per daughter she lost about 13 lb but has  recently gained some back  Hypoalbuminemia - ? If related to recent C diff infection and resultant poor oral intake  DM2 - last A1c 6.5 in 4/20- not on medications at home so I suspect it is diet controlled- sugars have been normal and thus d/c'd  fingerstick's.  HLD Crestor  Carcinoid tumor of stomach  Code Status: Full Code Family Communication: Daughter wants her back home Disposition Plan: Remains inpatient Diet:  Diet Order            Diet renal with fluid restriction Fluid restriction: 1200 mL Fluid; Room service appropriate? Yes; Fluid consistency: Thin  Diet effective now              Consultants   Neuro.  Nephrology  Procedures  . None  Antibiotics   None   Objective   Vitals:   10/08/19 0535 10/08/19 0900  BP: 122/74 129/72  Pulse: 95 89  Resp: 16 17  Temp: 98.1 F (36.7 C) 98 F (36.7 C)  SpO2: 100% 98%    Intake/Output Summary (Last 24 hours) at 10/08/2019 1133 Last data filed at 10/08/2019 0601 Gross per 24 hour  Intake 357 ml  Output 0 ml  Net 357 ml   Filed Weights   10/06/19 1044 10/07/19 0517 10/08/19 0535  Weight: 45.6 kg 45 kg 45.6 kg    Physical examination  General: Alert and awake, oriented x3, not in any acute distress. HEENT: anicteric sclera, pupils reactive to light and accommodation, EOMI CVS: S1-S2 clear, no murmur rubs or gallops Chest: clear to auscultation bilaterally, no wheezing, rales or rhonchi  Abdomen: soft nontender, nondistended, normal bowel sounds, no organomegaly Extremities: no cyanosis, clubbing or edema noted bilaterally Neuro: Cranial nerves II-XII intact, no focal neurological deficits  Reviewed data  Basic Metabolic Panel: Recent Labs  Lab 10/02/19 0530 10/03/19 0434 10/05/19 0741 10/06/19 0804 10/08/19 0453  NA 134* 135 138 140 143  K 4.7 4.0 4.0 4.0 4.3  CL 94* 92* 100 102 102  CO2 25 26 26 26 26   GLUCOSE 171* 168* 119* 120* 117*  BUN 40* 40* 34* 60* 50*  CREATININE 8.39*  7.97* 6.20* 8.07* 6.36*  CALCIUM 10.4* 10.3 10.3 9.9 9.6  PHOS  --   --   --  4.5 2.9   Liver Function Tests: Recent Labs  Lab 10/02/19 0530 10/06/19 0804 10/08/19 0453  AST 28  --   --   ALT 15  --   --   ALKPHOS 134*  --   --   BILITOT 0.7  --   --   PROT 5.0*  --   --   ALBUMIN 1.7* 1.6* 1.5*   No results for input(s): LIPASE, AMYLASE in the last 168 hours. Recent Labs  Lab 10/02/19 0530  AMMONIA 24   CBC: Recent Labs  Lab 10/02/19 0530 10/03/19 0434 10/06/19 0803 10/08/19 0453  WBC 11.4* 9.0 11.2* 12.8*  HGB 10.4* 9.5* 9.9* 9.0*  HCT 35.7* 32.9* 35.0* 31.5*  MCV 102.3* 101.9* 104.5* 103.3*  PLT 255 301 242 183   Cardiac Enzymes: No results for input(s): CKTOTAL, CKMB, CKMBINDEX, TROPONINI in the last 168 hours. BNP (last 3 results) Recent Labs    08/28/19 1930  BNP 90.6    ProBNP (last 3 results) No results for input(s): PROBNP in the last 8760 hours.  CBG: Recent Labs  Lab 10/07/19 0818 10/07/19 1635 10/07/19 2101 10/08/19 0651 10/08/19 1104  GLUCAP 157* 95 141* 110* 132*    Micro Recent Results (from the past 240 hour(s))  SARS CORONAVIRUS 2 (TAT 6-24 HRS) Nasopharyngeal Nasopharyngeal Swab     Status: None   Collection Time: 09/30/19  7:10 PM   Specimen: Nasopharyngeal Swab  Result Value Ref Range Status   SARS Coronavirus 2 NEGATIVE NEGATIVE Final    Comment: (NOTE) SARS-CoV-2 target nucleic acids are NOT DETECTED. The SARS-CoV-2 RNA is generally detectable in upper and lower respiratory specimens during the acute phase of infection. Negative results do not preclude SARS-CoV-2 infection, do not rule out co-infections with other pathogens, and should not be used as the sole basis for treatment or other patient management decisions. Negative results must be combined with clinical observations, patient history, and epidemiological information. The expected result is Negative. Fact Sheet for Patients:  SugarRoll.be Fact Sheet for Healthcare Providers: https://www.woods-mathews.com/ This test is not yet approved or cleared by the [place] FDA and  has been authorized for detection and/or diagnosis of SARS-CoV-2 by FDA under an Emergency Use Authorization (EUA). This EUA will remain  in effect (meaning this test can be used) for the duration of the COVID-19 declaration under Section 56 4(b)(1) of the Act, 21 U.S.C. section 360bbb-3(b)(1), unless the authorization is terminated or revoked sooner. Performed at [facility], [place] [address] [place], [place] [address]   Surgical pcr screen     Status: None   Collection Time: 10/01/19  1:44 AM   Specimen: Nasal Mucosa; Nasal Swab  Result Value Ref Range Status   MRSA, PCR NEGATIVE NEGATIVE Final   Staphylococcus aureus NEGATIVE NEGATIVE Final    Comment: (NOTE)  The Xpert SA Assay (FDA approved for NASAL specimens in patients 56 years of age and older), is one component of a comprehensive surveillance program. It is not intended to diagnose infection nor to guide or monitor treatment. Performed at [facility], [place] [address], [place], [place] [address]      Radiological studies, reviewed  Mr Virgel Paling DX Contrast  Result Date: 10/08/2019 CLINICAL DATA:  Confusion EXAM: MRA HEAD WITHOUT CONTRAST TECHNIQUE: Angiographic images of the Circle of Willis were obtained using MRA technique without intravenous contrast. COMPARISON:  None. FINDINGS: Intracranial internal carotid arteries, middle cerebral arteries, and anterior cerebral arteries are patent. There is a patent left posterior communicating artery. Intracranial vertebral arteries, posterior inferior cerebellar arteries basilar artery, and posterior cerebral arteries are patent. No significant stenosis or aneurysm. IMPRESSION: No proximal intracranial vessel occlusion or significant stenosis. Electronically Signed   By: Macy Mis M.D.   On: 10/08/2019 07:12   Mr Brain Wo Contrast  Result Date: 10/07/2019 CLINICAL DATA:  Altered level of consciousness. ESRD on peritoneal dialysis. Confusion. EXAM: MRI HEAD WITHOUT CONTRAST TECHNIQUE: Multiplanar, multiecho pulse sequences of the brain and surrounding structures were obtained without intravenous contrast. COMPARISON:  CT head 09/30/2019 FINDINGS: Brain: Multiple small areas of restricted diffusion are present compatible with acute infarct. Small acute infarcts in the cerebellum bilaterally. Scattered small acute cortical infarcts in the parietal lobes bilaterally. Acute infarct in the left frontal white matter and adjacent cortex. No associated hemorrhage. Moderate atrophy without hydrocephalus.  Negative for mass or edema. Vascular: Normal arterial flow voids. Skull and upper cervical spine: No focal skeletal lesion. Sinuses/Orbits: Surgery. Mucosal edema paranasal sinuses. Bilateral cataract Other: None IMPRESSION: Multiple small areas of acute infarction bilaterally in different vascular distributions, suggestive of multiple small emboli. No associated hemorrhage Moderate atrophy Electronically Signed   By: Franchot Gallo M.D.   On: 10/07/2019 11:26   Vas US Carotid  Result Date: 10/08/2019 Carotid Arterial Duplex Study Indications:       CVA. Risk Factors:      None. Limitations        Today's exam was limited due to a central line and Bandages. Comparison Study:  No prior studies. Performing Technologist: Oliver Hum RVT  Examination Guidelines: A complete evaluation includes B-mode imaging, spectral Doppler, color Doppler, and power Doppler as needed of all accessible portions of each vessel. Bilateral testing is considered an integral part of a complete examination. Limited examinations for reoccurring indications may be performed as noted.  Right Carotid Findings: +----------+--------+--------+--------+--------------------------+--------+           PSV cm/sEDV  cm/sStenosisPlaque Description        Comments +----------+--------+--------+--------+--------------------------+--------+ CCA Prox  83      13              smooth and heterogenous            +----------+--------+--------+--------+--------------------------+--------+ CCA Distal55      11              smooth and heterogenous            +----------+--------+--------+--------+--------------------------+--------+ ICA Prox  47      13              irregular and heterogenoustortuous +----------+--------+--------+--------+--------------------------+--------+ ICA Distal71      25  tortuous +----------+--------+--------+--------+--------------------------+--------+ ECA       84      0                                                  +----------+--------+--------+--------+--------------------------+--------+ +----------+--------+-------+------------+-------------------+           PSV cm/sEDV cmsDescribe    Arm Pressure (mmHG) +----------+--------+-------+------------+-------------------+ Subclavian               Not assessed                    +----------+--------+-------+------------+-------------------+ +---------+--------+--+--------+--+---------+ VertebralPSV cm/s77EDV cm/s17Antegrade +---------+--------+--+--------+--+---------+  Left Carotid Findings: +----------+--------+--------+--------+-----------------------+--------+           PSV cm/sEDV cm/sStenosisPlaque Description     Comments +----------+--------+--------+--------+-----------------------+--------+ CCA Prox  67      13              smooth and heterogenous         +----------+--------+--------+--------+-----------------------+--------+ CCA Distal61      16              smooth and heterogenous         +----------+--------+--------+--------+-----------------------+--------+ ICA Prox  71      20              smooth and heterogenoustortuous  +----------+--------+--------+--------+-----------------------+--------+ ICA Distal86      27                                     tortuous +----------+--------+--------+--------+-----------------------+--------+ ECA       72      4                                               +----------+--------+--------+--------+-----------------------+--------+ +----------+--------+--------+--------+-------------------+           PSV cm/sEDV cm/sDescribeArm Pressure (mmHG) +----------+--------+--------+--------+-------------------+ [id number]                                         +----------+--------+--------+--------+-------------------+ +---------+--------+---+--------+--+---------+ VertebralPSV cm/s116EDV cm/s32Antegrade +---------+--------+---+--------+--+---------+  Summary: Right Carotid: Velocities in the right ICA are consistent with a 1-39% stenosis. Left Carotid: Velocities in the left ICA are consistent with a 1-39% stenosis. Vertebrals: Bilateral vertebral arteries demonstrate antegrade flow. *See table(s) above for measurements and observations.     Preliminary     Scheduled Meds: . allopurinol  300 mg Oral Daily  . aspirin EC  81 mg Oral QHS  . Chlorhexidine Gluconate Cloth  6 each Topical Daily  . Chlorhexidine Gluconate Cloth  6 each Topical Q0600  . darbepoetin (ARANESP) injection - DIALYSIS  40 mcg Intravenous Q Mon-HD  . feeding supplement (NEPRO CARB STEADY)  237 mL Oral TID BM  . feeding supplement (PRO-STAT SUGAR FREE 64)  30 mL Oral BID  . insulin aspart  0-9 Units Subcutaneous TID WC  . lidocaine  15 mL Mouth/Throat TID AC  . multivitamin  1 tablet Oral QHS  . omega-3 acid ethyl esters  1 g Oral Daily  . pantoprazole  40 mg Oral BID  . rosuvastatin  5  mg Oral q1800   Continuous Infusions: . dialysis solution 1.5% low-MG/low-CA    . ferric gluconate (FERRLECIT/NULECIT) IV Stopped (10/06/19 1041)     Time spent: 35 minutes  [facility] Pager [phone] If 7PM-7AM, please contact night-coverage at [email or web address], password [facility] 10/08/2019, 11:33 AM  LOS: 7 days

## 2019-10-08 NOTE — Progress Notes (Signed)
Carotid artery duplex has been completed. Preliminary results can be found in CV Proc through chart review.   10/08/19 10:32 AM Carlos Levering RVT

## 2019-10-08 NOTE — Progress Notes (Addendum)
Subjective:  Just had 2d echoand carotid duplex  this am  , and does not remember it . For Hd today.  Called me Dr. Moshe Perez today  Remembered she only said Eileen Perez yesterday.  MRI results noted- possible embolic dz   Objective Vital signs in last 24 hours: Vitals:   10/07/19 1634 10/07/19 2102 10/08/19 0535 10/08/19 0900  BP: 104/62 (!) 105/59 122/74 129/72  Pulse: 95 100 95 89  Resp: 16 15 16 17   Temp:  98.7 F (37.1 C) 98.1 F (36.7 C) 98 F (36.7 C)  TempSrc:  Oral Oral Oral  SpO2: 99% 98% 100% 98%  Weight:   45.6 kg    Weight change: 0 kg  Physical Exam: General=alert,elderly female pleasantlydisoriented  No significant  Change  ,NAD Chest=CTA  Card=RRR no MRG Abd=soft + BS ,ntnd LLQ PD cath Ext=no LEedema/ R IJ Perm cath   Home meds: - allopurinol 300/ pantoprazole 40 bid - rosuvastatin 5 qd/ Kdur 20 qd - prn's/ vitamins/ supplements  OutptPD: 5 exchanges, 2500 dwell, 2 hr each, no day bag, edw 48.5kg - started in 2017, last clearance here was 2.05 on 08/2019.   Problem/Plan 1.AMS/ confusion/ Acute CVA  On MRI yest - . Neuro  Seeing now ,CVA  Eileen Perez started  - 2. ESRD - Was on PD x 3 yrs. / not able to preform now with CVA  transition to HD trial for now.needs clip for op hd- 3. Volume/BP- no vol excess on exam, under dry wt/ bp stable without meds- Does not need much UF- not tolerating it - no BP meds  4. Anemia ckd - HGB 9.0<9.9  Aranesp 11/16 hd 40 mcg q wk hd op iron studies =09/23/19 22% Ferritin 1155 have Started venofer x 3  5. Secondary Hyperparathyroidism -phos 4.5  Ca  9.9  Corec 11.8  on no meds/ use low ca bath  6. DMT2 - per admit  7. Recent C Diff - diarrhea has improved 8. Hypoalbuminemia- sec Combination PD , decreased appetite with uremia /Nepro started 11/16 , held nepro for  Diarrhea  recen tc diff / use  Pro stat \  Eileen Haber, PA-C [place] [phone] 10/08/2019,11:12  AM  LOS: 7 days    Patient seen and examined, agree with above note with above modifications. Her confusion waxes and wanes it appears.  She does not appear phased that she is not on hemodialysis but not sure she truly understands.   I spoke to patients daughter and son.  They are extremely interested in keeping her on PD-  Moving in with the son in [place].   My concern is that her albumin is so low- PD would not be great long term and I have told them that.  They are agreeable to keep her on HD.  Will need a transient assignment here when she is at home with 24/7 care-  From granddaughter but eventual plan will be to move to [place] to live with son.  He is going to look for HD units in his area for her  Eileen Parish, MD 10/08/2019    Labs: Basic Metabolic Panel: Recent Labs  Lab 10/05/19 0741 10/06/19 0804 10/08/19 0453  NA 138 140 143  K 4.0 4.0 4.3  CL 100 102 102  CO2 26 26 26   GLUCOSE 119* 120* 117*  BUN 34* 60* 50*  CREATININE 6.20* 8.07* 6.36*  CALCIUM 10.3 9.9 9.6  PHOS  --  4.5 2.9  Liver Function Tests: Recent Labs  Lab 10/02/19 0530 10/06/19 0804 10/08/19 0453  AST 28  --   --   ALT 15  --   --   ALKPHOS 134*  --   --   BILITOT 0.7  --   --   PROT 5.0*  --   --   ALBUMIN 1.7* 1.6* 1.5*   No results for input(s): LIPASE, AMYLASE in the last 168 hours. Recent Labs  Lab 10/02/19 0530  AMMONIA 24   CBC: Recent Labs  Lab 10/02/19 0530 10/03/19 0434 10/06/19 0803 10/08/19 0453  WBC 11.4* 9.0 11.2* 12.8*  HGB 10.4* 9.5* 9.9* 9.0*  HCT 35.7* 32.9* 35.0* 31.5*  MCV 102.3* 101.9* 104.5* 103.3*  PLT 255 301 242 183   Cardiac Enzymes: No results for input(s): CKTOTAL, CKMB, CKMBINDEX, TROPONINI in the last 168 hours. CBG: Recent Labs  Lab 10/07/19 0818 10/07/19 1635 10/07/19 2101 10/08/19 0651 10/08/19 1104  GLUCAP 157* 95 141* 110* 132*    Studies/Results: Mr Eileen Perez Head Wo Contrast  Result Date: 10/08/2019 CLINICAL DATA:  Confusion  EXAM: MRA HEAD WITHOUT CONTRAST TECHNIQUE: Angiographic images of the Circle of Willis were obtained using MRA technique without intravenous contrast. COMPARISON:  None. FINDINGS: Intracranial internal carotid arteries, middle cerebral arteries, and anterior cerebral arteries are patent. There is a patent left posterior communicating artery. Intracranial vertebral arteries, posterior inferior cerebellar arteries basilar artery, and posterior cerebral arteries are patent. No significant stenosis or aneurysm. IMPRESSION: No proximal intracranial vessel occlusion or significant stenosis. Electronically Signed   By: Eileen Perez M.D.   On: 10/08/2019 07:12   Mr Brain Wo Contrast  Result Date: 10/07/2019 CLINICAL DATA:  Altered level of consciousness. ESRD on peritoneal dialysis. Confusion. EXAM: MRI HEAD WITHOUT CONTRAST TECHNIQUE: Multiplanar, multiecho pulse sequences of the brain and surrounding structures were obtained without intravenous contrast. COMPARISON:  CT head 09/30/2019 FINDINGS: Brain: Multiple small areas of restricted diffusion are present compatible with acute infarct. Small acute infarcts in the cerebellum bilaterally. Scattered small acute cortical infarcts in the parietal lobes bilaterally. Acute infarct in the left frontal white matter and adjacent cortex. No associated hemorrhage. Moderate atrophy without hydrocephalus.  Negative for mass or edema. Vascular: Normal arterial flow voids. Skull and upper cervical spine: No focal skeletal lesion. Sinuses/Orbits: Surgery. Mucosal edema paranasal sinuses. Bilateral cataract Other: None IMPRESSION: Multiple small areas of acute infarction bilaterally in different vascular distributions, suggestive of multiple small emboli. No associated hemorrhage Moderate atrophy Electronically Signed   By: Eileen Perez M.D.   On: 10/07/2019 11:26   Vas US Carotid  Result Date: 10/08/2019 Carotid Arterial Duplex Study Indications:       CVA. Risk Factors:       None. Limitations        Today's exam was limited due to a central line and Bandages. Comparison Study:  No prior studies. Performing Technologist: Eileen Perez RVT  Examination Guidelines: A complete evaluation includes B-mode imaging, spectral Doppler, color Doppler, and power Doppler as needed of all accessible portions of each vessel. Bilateral testing is considered an integral part of a complete examination. Limited examinations for reoccurring indications may be performed as noted.  Right Carotid Findings: +----------+--------+--------+--------+--------------------------+--------+           PSV cm/sEDV cm/sStenosisPlaque Description        Comments +----------+--------+--------+--------+--------------------------+--------+ CCA Prox  83      13              smooth and heterogenous            +----------+--------+--------+--------+--------------------------+--------+  CCA Distal55      11              smooth and heterogenous            +----------+--------+--------+--------+--------------------------+--------+ ICA Prox  47      13              irregular and heterogenoustortuous +----------+--------+--------+--------+--------------------------+--------+ ICA Distal71      25                                        tortuous +----------+--------+--------+--------+--------------------------+--------+ ECA       84      0                                                  +----------+--------+--------+--------+--------------------------+--------+ +----------+--------+-------+------------+-------------------+           PSV cm/sEDV cmsDescribe    Arm Pressure (mmHG) +----------+--------+-------+------------+-------------------+ Subclavian               Not assessed                    +----------+--------+-------+------------+-------------------+ +---------+--------+--+--------+--+---------+ VertebralPSV cm/s77EDV cm/s17Antegrade  +---------+--------+--+--------+--+---------+  Left Carotid Findings: +----------+--------+--------+--------+-----------------------+--------+           PSV cm/sEDV cm/sStenosisPlaque Description     Comments +----------+--------+--------+--------+-----------------------+--------+ CCA Prox  67      13              smooth and heterogenous         +----------+--------+--------+--------+-----------------------+--------+ CCA Distal61      16              smooth and heterogenous         +----------+--------+--------+--------+-----------------------+--------+ ICA Prox  71      20              smooth and heterogenoustortuous +----------+--------+--------+--------+-----------------------+--------+ ICA Distal86      27                                     tortuous +----------+--------+--------+--------+-----------------------+--------+ ECA       72      4                                               +----------+--------+--------+--------+-----------------------+--------+ +----------+--------+--------+--------+-------------------+           PSV cm/sEDV cm/sDescribeArm Pressure (mmHG) +----------+--------+--------+--------+-------------------+ [id number]                                         +----------+--------+--------+--------+-------------------+ +---------+--------+---+--------+--+---------+ VertebralPSV cm/s116EDV cm/s32Antegrade +---------+--------+---+--------+--+---------+  Summary: Right Carotid: Velocities in the right ICA are consistent with a 1-39% stenosis. Left Carotid: Velocities in the left ICA are consistent with a 1-39% stenosis. Vertebrals: Bilateral vertebral arteries demonstrate antegrade flow. *See table(s) above for measurements and observations.     Preliminary    Medications: . dialysis solution 1.5% low-MG/low-CA    . ferric gluconate (FERRLECIT/NULECIT) IV Stopped (10/06/19 1041)   .  allopurinol  300 mg Oral Daily  . aspirin EC   81 mg Oral QHS  . Chlorhexidine Gluconate Cloth  6 each Topical Daily  . Chlorhexidine Gluconate Cloth  6 each Topical Q0600  . darbepoetin (ARANESP) injection - DIALYSIS  40 mcg Intravenous Q Mon-HD  . feeding supplement (NEPRO CARB STEADY)  237 mL Oral TID BM  . feeding supplement (PRO-STAT SUGAR FREE 64)  30 mL Oral BID  . insulin aspart  0-9 Units Subcutaneous TID WC  . lidocaine  15 mL Mouth/Throat TID AC  . multivitamin  1 tablet Oral QHS  . omega-3 acid ethyl esters  1 g Oral Daily  . pantoprazole  40 mg Oral BID  . rosuvastatin  5 mg Oral q1800

## 2019-10-08 NOTE — Progress Notes (Addendum)
STROKE TEAM PROGRESS NOTE   INTERVAL HISTORY Pt lying in bed, knowing her name and age as well as  Year, but not to month or situation. She has no weakness. I called her son and discussed about further stroke work up with LE venous doppler and loop recorder, he is in agreement.   Vitals:   10/07/19 1634 10/07/19 2102 10/08/19 0535 10/08/19 0900  BP: 104/62 (!) 105/59 122/74 129/72  Pulse: 95 100 95 89  Resp: 16 15 16 17   Temp:  98.7 F (37.1 C) 98.1 F (36.7 C) 98 F (36.7 C)  TempSrc:  Oral Oral Oral  SpO2: 99% 98% 100% 98%  Weight:   45.6 kg     CBC:  Recent Labs  Lab 10/06/19 0803 10/08/19 0453  WBC 11.2* 12.8*  HGB 9.9* 9.0*  HCT 35.0* 31.5*  MCV 104.5* 103.3*  PLT 242 564    Basic Metabolic Panel:  Recent Labs  Lab 10/06/19 0804 10/08/19 0453  NA 140 143  K 4.0 4.3  CL 102 102  CO2 26 26  GLUCOSE 120* 117*  BUN 60* 50*  CREATININE 8.07* 6.36*  CALCIUM 9.9 9.6  PHOS 4.5 2.9   Lipid Panel:     Component Value Date/Time   CHOL 77 10/08/2019 0453   TRIG 141 10/08/2019 0453   HDL 29 (L) 10/08/2019 0453   CHOLHDL 2.7 10/08/2019 0453   VLDL 28 10/08/2019 0453   LDLCALC 20 10/08/2019 0453   LDLCALC 161 (H) 06/24/2019 1216   HgbA1c:  Lab Results  Component Value Date   HGBA1C 5.0 10/08/2019   Urine Drug Screen: No results found for: LABOPIA, COCAINSCRNUR, LABBENZ, AMPHETMU, THCU, LABBARB  Alcohol Level No results found for: [facility]  IMAGING Mr Jodene Nam Head Wo Contrast  Result Date: 10/08/2019 CLINICAL DATA:  Confusion EXAM: MRA HEAD WITHOUT CONTRAST TECHNIQUE: Angiographic images of the Circle of Willis were obtained using MRA technique without intravenous contrast. COMPARISON:  None. FINDINGS: Intracranial internal carotid arteries, middle cerebral arteries, and anterior cerebral arteries are patent. There is a patent left posterior communicating artery. Intracranial vertebral arteries, posterior inferior cerebellar arteries basilar artery, and posterior  cerebral arteries are patent. No significant stenosis or aneurysm. IMPRESSION: No proximal intracranial vessel occlusion or significant stenosis. Electronically Signed   By: Macy Mis M.D.   On: 10/08/2019 07:12   Mr Brain Wo Contrast  Result Date: 10/07/2019 CLINICAL DATA:  Altered level of consciousness. ESRD on peritoneal dialysis. Confusion. EXAM: MRI HEAD WITHOUT CONTRAST TECHNIQUE: Multiplanar, multiecho pulse sequences of the brain and surrounding structures were obtained without intravenous contrast. COMPARISON:  CT head 09/30/2019 FINDINGS: Brain: Multiple small areas of restricted diffusion are present compatible with acute infarct. Small acute infarcts in the cerebellum bilaterally. Scattered small acute cortical infarcts in the parietal lobes bilaterally. Acute infarct in the left frontal white matter and adjacent cortex. No associated hemorrhage. Moderate atrophy without hydrocephalus.  Negative for mass or edema. Vascular: Normal arterial flow voids. Skull and upper cervical spine: No focal skeletal lesion. Sinuses/Orbits: Surgery. Mucosal edema paranasal sinuses. Bilateral cataract Other: None IMPRESSION: Multiple small areas of acute infarction bilaterally in different vascular distributions, suggestive of multiple small emboli. No associated hemorrhage Moderate atrophy Electronically Signed   By: Franchot Gallo M.D.   On: 10/07/2019 11:26   Vas US Carotid  Result Date: 10/08/2019 Carotid Arterial Duplex Study Indications:       CVA. Risk Factors:      None. Limitations  Today's exam was limited due to a central line and Bandages. Comparison Study:  No prior studies. Performing Technologist: Oliver Hum RVT  Examination Guidelines: A complete evaluation includes B-mode imaging, spectral Doppler, color Doppler, and power Doppler as needed of all accessible portions of each vessel. Bilateral testing is considered an integral part of a complete examination. Limited examinations  for reoccurring indications may be performed as noted.  Right Carotid Findings: +----------+--------+--------+--------+--------------------------+--------+           PSV cm/sEDV cm/sStenosisPlaque Description        Comments +----------+--------+--------+--------+--------------------------+--------+ CCA Prox  83      13              smooth and heterogenous            +----------+--------+--------+--------+--------------------------+--------+ CCA Distal55      11              smooth and heterogenous            +----------+--------+--------+--------+--------------------------+--------+ ICA Prox  47      13              irregular and heterogenoustortuous +----------+--------+--------+--------+--------------------------+--------+ ICA Distal71      25                                        tortuous +----------+--------+--------+--------+--------------------------+--------+ ECA       84      0                                                  +----------+--------+--------+--------+--------------------------+--------+ +----------+--------+-------+------------+-------------------+           PSV cm/sEDV cmsDescribe    Arm Pressure (mmHG) +----------+--------+-------+------------+-------------------+ Subclavian               Not assessed                    +----------+--------+-------+------------+-------------------+ +---------+--------+--+--------+--+---------+ VertebralPSV cm/s77EDV cm/s17Antegrade +---------+--------+--+--------+--+---------+  Left Carotid Findings: +----------+--------+--------+--------+-----------------------+--------+           PSV cm/sEDV cm/sStenosisPlaque Description     Comments +----------+--------+--------+--------+-----------------------+--------+ CCA Prox  67      13              smooth and heterogenous         +----------+--------+--------+--------+-----------------------+--------+ CCA Distal61      16               smooth and heterogenous         +----------+--------+--------+--------+-----------------------+--------+ ICA Prox  71      20              smooth and heterogenoustortuous +----------+--------+--------+--------+-----------------------+--------+ ICA Distal86      27                                     tortuous +----------+--------+--------+--------+-----------------------+--------+ ECA       72      4                                               +----------+--------+--------+--------+-----------------------+--------+ +----------+--------+--------+--------+-------------------+  PSV cm/sEDV cm/sDescribeArm Pressure (mmHG) +----------+--------+--------+--------+-------------------+ [id number]                                         +----------+--------+--------+--------+-------------------+ +---------+--------+---+--------+--+---------+ VertebralPSV cm/s116EDV cm/s32Antegrade +---------+--------+---+--------+--+---------+  Summary: Right Carotid: Velocities in the right ICA are consistent with a 1-39% stenosis. Left Carotid: Velocities in the left ICA are consistent with a 1-39% stenosis. Vertebrals: Bilateral vertebral arteries demonstrate antegrade flow. *See table(s) above for measurements and observations.     Preliminary     PHYSICAL EXAM  Temp:  [98 F (36.7 C)-98.7 F (37.1 C)] 98 F (36.7 C) (11/18 0900) Pulse Rate:  [89-100] 89 (11/18 0900) Resp:  [15-17] 17 (11/18 0900) BP: (104-129)/(59-74) 129/72 (11/18 0900) SpO2:  [98 %-100 %] 98 % (11/18 0900) Weight:  [45.6 kg] 45.6 kg (11/18 0535)  General - Well nourished, well developed, in no apparent distress.  Ophthalmologic - fundi not visualized due to noncooperation.  Cardiovascular - Regular rhythm and rate.  Mental Status -  Level of arousal and orientation to year, place, and age, but confused on people and not orientated to month or situation, not knowing why she is in  hospital. Language including expression, naming, repetition, comprehension was assessed and found intact. Recent and remote memory were impaired Fund of Knowledge was assessed and was impaired  Cranial Nerves II - XII - II - Visual field intact OU. III, IV, VI - Extraocular movements intact. V - Facial sensation intact bilaterally. VII - Facial movement intact bilaterally. VIII - Hearing & vestibular intact bilaterally. X - Palate elevates symmetrically. XI - Chin turning & shoulder shrug intact bilaterally. XII - Tongue protrusion intact.  Motor Strength - The patient's strength was normal in all extremities and pronator drift was absent.  Bulk was normal and fasciculations were absent.   Motor Tone - Muscle tone was assessed at the neck and appendages and was normal.  Reflexes - The patient's reflexes were symmetrical in all extremities and she had no pathological reflexes.  Sensory - Light touch, temperature/pinprick were assessed and were symmetrical.    Coordination - The patient had normal movements in the hands and feet with no ataxia or dysmetria.  Tremor was absent.  Gait and Station - deferred.   ASSESSMENT/PLAN Eileen Perez is a 78 y.o. female with history of pancreatic cyst, hypertension, hyperlipidemia, hiatal hernia, history of blood transfusion, diabetes and chronic kidney disease admitted on 09/30/2019 with confusion in setting of uremia. Remained confused post HD x 2 with MRI confirming multiple B infarcts. As per son, pt likely started confusion 2 days prior to admission.   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 Small vessel disease. Atrophy.   MRI  Multiple B small anterior and posterior infarcts  MRA  Unremarkable   Carotid Doppler  B ICA 1-39% stenosis, VAs antegrade   2D Echo pending  LE venous doppler pending  Consider LOOP placement to look for AF  as source of stroke if above work up negative   LDL 20  HgbA1c 5.0  No listed VTE prophylaxis, added SCDs  aspirin 81 mg daily prior to admission, now on aspirin 81 mg daily. May consider DAPT if anemia improves  Therapy recommendations:  SNF   Disposition:  pending   Hypertension  Stable  No BP meds now . 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 Now on HD. renal onboard.     Hyperparathyroidism  Diarrhea w/ recent c diff  Gout   Hypoalbuminemia   Carcinoid tumor of the stomach  Hospital day # 7  I had long discussion with son over the phone, updated pt current condition, treatment plan and potential prognosis, and answered all the questions. He expressed understanding and appreciation. He is in agreement of loop recorder if needed. He works in [employer] in [place], MD PhD Stroke Neurology 10/08/2019 12:23 PM   To contact Stroke Continuity provider, please refer to [email or web address] After hours, contact General Neurology

## 2019-10-08 NOTE — Progress Notes (Signed)
  Echocardiogram 2D Echocardiogram has been performed.  Cloee Dunwoody G Medina Degraffenreid 10/08/2019, 9:40 AM

## 2019-10-09 ENCOUNTER — Inpatient Hospital Stay (HOSPITAL_COMMUNITY): Payer: Medicare Other

## 2019-10-09 ENCOUNTER — Encounter (HOSPITAL_COMMUNITY)
Admission: EM | Disposition: A | Discharge status: Home Health | Payer: Self-pay | Source: Home / Self Care | Attending: Internal Medicine

## 2019-10-09 ENCOUNTER — Other Ambulatory Visit: Payer: Self-pay | Admitting: Student

## 2019-10-09 DIAGNOSIS — I639 Cerebral infarction, unspecified: Secondary | ICD-10-CM

## 2019-10-09 DIAGNOSIS — I6310 Cerebral infarction due to embolism of unspecified precerebral artery: Secondary | ICD-10-CM

## 2019-10-09 DIAGNOSIS — Z23 Encounter for immunization: Secondary | ICD-10-CM | POA: Diagnosis not present

## 2019-10-09 DIAGNOSIS — D631 Anemia in chronic kidney disease: Secondary | ICD-10-CM | POA: Diagnosis not present

## 2019-10-09 DIAGNOSIS — R82998 Other abnormal findings in urine: Secondary | ICD-10-CM | POA: Diagnosis not present

## 2019-10-09 DIAGNOSIS — N186 End stage renal disease: Secondary | ICD-10-CM | POA: Diagnosis not present

## 2019-10-09 DIAGNOSIS — E119 Type 2 diabetes mellitus without complications: Secondary | ICD-10-CM | POA: Diagnosis not present

## 2019-10-09 DIAGNOSIS — N2581 Secondary hyperparathyroidism of renal origin: Secondary | ICD-10-CM | POA: Diagnosis not present

## 2019-10-09 DIAGNOSIS — Z515 Encounter for palliative care: Secondary | ICD-10-CM

## 2019-10-09 DIAGNOSIS — D509 Iron deficiency anemia, unspecified: Secondary | ICD-10-CM | POA: Diagnosis not present

## 2019-10-09 DIAGNOSIS — N2589 Other disorders resulting from impaired renal tubular function: Secondary | ICD-10-CM | POA: Diagnosis not present

## 2019-10-09 DIAGNOSIS — Z992 Dependence on renal dialysis: Secondary | ICD-10-CM | POA: Diagnosis not present

## 2019-10-09 LAB — GLUCOSE, CAPILLARY
Glucose-Capillary: 124 mg/dL — ABNORMAL HIGH (ref 70–99)
Glucose-Capillary: 135 mg/dL — ABNORMAL HIGH (ref 70–99)
Glucose-Capillary: 101 mg/dL — ABNORMAL HIGH (ref 70–99)
Glucose-Capillary: 119 mg/dL — ABNORMAL HIGH (ref 70–99)

## 2019-10-09 SURGERY — LOOP RECORDER INSERTION

## 2019-10-09 MED ORDER — CLOPIDOGREL BISULFATE 75 MG PO TABS
75.0000 mg | ORAL_TABLET | Freq: Every day | ORAL | Status: DC
  Administered 2019-10-09 – 2019-10-11 (×3): 75 mg via ORAL
  Filled 2019-10-09 (×4): qty 1

## 2019-10-09 NOTE — Progress Notes (Addendum)
STROKE TEAM PROGRESS NOTE   INTERVAL HISTORY Pt lying in bed, no acute event overnight. She still disorientated to time and situation. Diarrhea as reported better, but pt can not tell me whether she had bowel movement or not. Pending loop recorder.    Vitals:   10/08/19 1655 10/08/19 2109 10/09/19 0548 10/09/19 0955  BP: 120/74 (!) 109/55 113/66   Pulse: 97 98 97   Resp: _0 Temp:  99.1 F (37.3 C) 98.8 F (37.1 C) 98.6 F (37 C)  TempSrc:  Oral Oral Oral  SpO2: 99% 98% 98% 98%  Weight:  46.9 kg      CBC:  Recent Labs  Lab 10/08/19 0453 10/08/19 1307  WBC 12.8* 13.3*  HGB 9.0* 9.1*  HCT 31.5* 32.2*  MCV 103.3* 104.5*  PLT 183 062    Basic Metabolic Panel:  Recent Labs  Lab 10/08/19 0453 10/08/19 1307  NA 143 142  K 4.3 4.3  CL 102 103  CO2 26 26  GLUCOSE 117* 132*  BUN 50* 59*  CREATININE 6.36* 6.85*  CALCIUM 9.6 9.6  PHOS 2.9 3.2   Lipid Panel:     Component Value Date/Time   CHOL 77 10/08/2019 0453   TRIG 141 10/08/2019 0453   HDL 29 (L) 10/08/2019 0453   CHOLHDL 2.7 10/08/2019 0453   VLDL 28 10/08/2019 0453   LDLCALC 20 10/08/2019 0453   LDLCALC 161 (H) 06/24/2019 1216   HgbA1c:  Lab Results  Component Value Date   HGBA1C 5.0 10/08/2019   Urine Drug Screen: No results found for: LABOPIA, COCAINSCRNUR, LABBENZ, AMPHETMU, THCU, LABBARB  Alcohol Level No results found for: [facility]  IMAGING Mr Jodene Nam Head Wo Contrast  Result Date: 10/08/2019 CLINICAL DATA:  Confusion EXAM: MRA HEAD WITHOUT CONTRAST TECHNIQUE: Angiographic images of the Circle of Willis were obtained using MRA technique without intravenous contrast. COMPARISON:  None. FINDINGS: Intracranial internal carotid arteries, middle cerebral arteries, and anterior cerebral arteries are patent. There is a patent left posterior communicating artery. Intracranial vertebral arteries, posterior inferior cerebellar arteries basilar artery, and posterior cerebral arteries are patent. No  significant stenosis or aneurysm. IMPRESSION: No proximal intracranial vessel occlusion or significant stenosis. Electronically Signed   By: Macy Mis M.D.   On: 10/08/2019 07:12   Vas US Carotid  Result Date: 10/08/2019 Carotid Arterial Duplex Study Indications:       CVA. Risk Factors:      None. Limitations        Today's exam was limited due to a central line and Bandages. Comparison Study:  No prior studies. Performing Technologist: Oliver Hum RVT  Examination Guidelines: A complete evaluation includes B-mode imaging, spectral Doppler, color Doppler, and power Doppler as needed of all accessible portions of each vessel. Bilateral testing is considered an integral part of a complete examination. Limited examinations for reoccurring indications may be performed as noted.  Right Carotid Findings: +----------+--------+--------+--------+--------------------------+--------+           PSV cm/sEDV cm/sStenosisPlaque Description        Comments +----------+--------+--------+--------+--------------------------+--------+ CCA Prox  83      13              smooth and heterogenous            +----------+--------+--------+--------+--------------------------+--------+ CCA Distal55      11              smooth and heterogenous            +----------+--------+--------+--------+--------------------------+--------+ ICA  Prox  47      13              irregular and heterogenoustortuous +----------+--------+--------+--------+--------------------------+--------+ ICA Distal71      25                                        tortuous +----------+--------+--------+--------+--------------------------+--------+ ECA       84      0                                                  +----------+--------+--------+--------+--------------------------+--------+ +----------+--------+-------+------------+-------------------+           PSV cm/sEDV cmsDescribe    Arm Pressure (mmHG)  +----------+--------+-------+------------+-------------------+ Subclavian               Not assessed                    +----------+--------+-------+------------+-------------------+ +---------+--------+--+--------+--+---------+ VertebralPSV cm/s77EDV cm/s17Antegrade +---------+--------+--+--------+--+---------+  Left Carotid Findings: +----------+--------+--------+--------+-----------------------+--------+           PSV cm/sEDV cm/sStenosisPlaque Description     Comments +----------+--------+--------+--------+-----------------------+--------+ CCA Prox  67      13              smooth and heterogenous         +----------+--------+--------+--------+-----------------------+--------+ CCA Distal61      16              smooth and heterogenous         +----------+--------+--------+--------+-----------------------+--------+ ICA Prox  71      20              smooth and heterogenoustortuous +----------+--------+--------+--------+-----------------------+--------+ ICA Distal86      27                                     tortuous +----------+--------+--------+--------+-----------------------+--------+ ECA       72      4                                               +----------+--------+--------+--------+-----------------------+--------+ +----------+--------+--------+--------+-------------------+           PSV cm/sEDV cm/sDescribeArm Pressure (mmHG) +----------+--------+--------+--------+-------------------+ [id number]                                         +----------+--------+--------+--------+-------------------+ +---------+--------+---+--------+--+---------+ VertebralPSV cm/s116EDV cm/s32Antegrade +---------+--------+---+--------+--+---------+  Summary: Right Carotid: Velocities in the right ICA are consistent with a 1-39% stenosis. Left Carotid: Velocities in the left ICA are consistent with a 1-39% stenosis. Vertebrals: Bilateral vertebral  arteries demonstrate antegrade flow. *See table(s) above for measurements and observations.     Preliminary    Vas [place] Lower Extremity Venous (dvt)  Result Date: 10/09/2019  Lower Venous Study Indications: Stroke.  Limitations: Body habitus and poor ultrasound/tissue interface. Comparison Study: No prior study Performing Technologist: Maudry Mayhew MHA, RDMS, RVT, RDCS  Examination Guidelines: A complete evaluation includes B-mode imaging, spectral Doppler, color Doppler, and power Doppler as needed  of all accessible portions of each vessel. Bilateral testing is considered an integral part of a complete examination. Limited examinations for reoccurring indications may be performed as noted.  +---------+---------------+---------+-----------+----------+--------------+ RIGHT    CompressibilityPhasicitySpontaneityPropertiesThrombus Aging +---------+---------------+---------+-----------+----------+--------------+ CFV      Full           Yes      Yes                                 +---------+---------------+---------+-----------+----------+--------------+ SFJ      Full                                                        +---------+---------------+---------+-----------+----------+--------------+ FV Prox  Full                                                        +---------+---------------+---------+-----------+----------+--------------+ FV Mid   Full                                                        +---------+---------------+---------+-----------+----------+--------------+ FV DistalFull                                                        +---------+---------------+---------+-----------+----------+--------------+ PFV      Full                                                        +---------+---------------+---------+-----------+----------+--------------+ POP      Full           Yes      Yes                                  +---------+---------------+---------+-----------+----------+--------------+ PTV      Full                                                        +---------+---------------+---------+-----------+----------+--------------+ PERO     Full                                                        +---------+---------------+---------+-----------+----------+--------------+  +---------+---------------+---------+-----------+----------+--------------+ LEFT     CompressibilityPhasicitySpontaneityPropertiesThrombus Aging +---------+---------------+---------+-----------+----------+--------------+ CFV      Full  Yes      Yes                                 +---------+---------------+---------+-----------+----------+--------------+ SFJ      Full                                                        +---------+---------------+---------+-----------+----------+--------------+ FV Prox  Full                                                        +---------+---------------+---------+-----------+----------+--------------+ FV Mid   Full                                                        +---------+---------------+---------+-----------+----------+--------------+ FV DistalFull                                                        +---------+---------------+---------+-----------+----------+--------------+ PFV      Full                                                        +---------+---------------+---------+-----------+----------+--------------+ POP      Full           Yes      Yes                                 +---------+---------------+---------+-----------+----------+--------------+ PTV      Full                                                        +---------+---------------+---------+-----------+----------+--------------+ PERO     Full                                                         +---------+---------------+---------+-----------+----------+--------------+  Summary: Right: There is no evidence of deep vein thrombosis in the lower extremity. However, portions of this examination were limited- see technologist comments above. No cystic structure found in the popliteal fossa. Left: There is no evidence of deep vein thrombosis in the lower extremity. However, portions of this examination were limited- see technologist comments above. No cystic structure found in the popliteal fossa.  Unable to adequately visualize bilateral  calf veins. *See table(s) above for measurements and observations.    Preliminary     PHYSICAL EXAM   Temp:  [97.9 F (36.6 C)-99.1 F (37.3 C)] 98.6 F (37 C) (11/19 0955) Pulse Rate:  [94-98] 97 (11/19 0548) Resp:  [16-18] 18 (11/19 0955) BP: (87-120)/(53-74) 113/66 (11/19 0548) SpO2:  [98 %-99 %] 98 % (11/19 0955) Weight:  [46.9 kg] 46.9 kg (11/18 2109)  General - Well nourished, well developed, in no apparent distress.  Ophthalmologic - fundi not visualized due to noncooperation.  Cardiovascular - Regular rhythm and rate.  Mental Status -  Level of arousal and orientation to year, place, and age, but still not orientated to month or situation, not knowing why she is in hospital, can not tell me whether she had bowel movement or not. Language including expression, naming, repetition, comprehension was assessed and found intact. Recent and remote memory were impaired Fund of Knowledge was assessed and was impaired  Cranial Nerves II - XII - II - Visual field intact OU. III, IV, VI - Extraocular movements intact. V - Facial sensation intact bilaterally. VII - Facial movement intact bilaterally. VIII - Hearing & vestibular intact bilaterally. X - Palate elevates symmetrically. XI - Chin turning & shoulder shrug intact bilaterally. XII - Tongue protrusion intact.  Motor Strength - The patient's strength was normal in all extremities and  pronator drift was absent.  Bulk was normal and fasciculations were absent.   Motor Tone - Muscle tone was assessed at the neck and appendages and was normal.  Reflexes - The patient's reflexes were symmetrical in all extremities and she had no pathological reflexes.  Sensory - Light touch, temperature/pinprick were assessed and were symmetrical.    Coordination - The patient had normal movements in the hands and feet with no ataxia or dysmetria.  Tremor was absent.  Gait and Station - deferred.   ASSESSMENT/PLAN Ms. Eileen Perez is a 78 y.o. female with history of pancreatic cyst, hypertension, hyperlipidemia, hiatal hernia, history of blood transfusion, diabetes and chronic kidney disease admitted on 09/30/2019 with confusion in setting of uremia. Remained confused post HD x 2 with MRI confirming multiple B infarcts. As per son, pt likely started confusion 2 days prior to admission.   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 Small vessel disease. Atrophy.   MRI  Multiple B small anterior and posterior infarcts  MRA  Unremarkable   Carotid Doppler  B ICA 1-39% stenosis, VAs antegrade   2D Echo EF 60-65%. No source of embolus   LE venous doppler no DVT  [facility] EP cardiology consulted for LOOP placement to look for AF as source of stroke - given plan for [facility] following by possiblie move to [place] vs immediate move [place] - loop deferred -  Plan 30d monitoring w/ f/u in office in 6 weeks, o/w f/u with cards in [place]  LDL 20  HgbA1c 5.0  No listed VTE prophylaxis, added SCDs  aspirin 81 mg daily prior to admission, now on aspirin 81 mg daily and clopidogrel 75 mg daily. Continue DAPT x 3 weeks then plavix alone   Therapy recommendations:  SNF   Disposition:  pending   Hypotension   Intermittent, likely related to HD  As low as 70s  Recommend avoid low BP during HD  Orthostatic vital  negative for orthostatic hypotension (lying 118/77, sitting 116/66, standing 108/63)  Hyperlipidemia  Home  meds:  crestor 5, resumed in hospital  LDL 20, goal < 70  Will not increase statin dose d/t very low LDL  Continue statin at discharge  Diabetes type II Controlled  HgbA1c 5.0, goal < 7.0  CBGs  SSI  Anemia of chronic dz  Hb 10.4->9.9->9.0->9.1   On ASA 81 and plavix  Close monitor CBC  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 Polyps removed 06/13/2019 - pathology no malignancy   CT abdomen/pelvis 05/20/2019 - 1 cm gastrohepatic ligament node   Do not feel related to current stroke  Other Stroke Risk Factors  Advanced age  Other Active Problems  ESRD on PD PTA (since 2017). Now on HD. renal onboard.   Hyperparathyroidism  Diarrhea w/ recent c diff  Gout on allopurinol  Hypoalbuminemia   Severe protein calorie malnutrition  Palliative consulted. To continue full scope treatment. Family to discuss code status. Plan home w/ granddaughter prior to eventual move to [place] w/ son. Want ongoing HD. Consider OP palliative referral.   Hospital day # 8  Neurology will sign off. Please call with questions. Pt will follow up with stroke clinic NP at [facility] in about 4 weeks or otherwise follow up with [place] neurology. Thanks for the consult.  Rosalin Hawking, MD PhD Stroke Neurology 10/09/2019 1:32 PM   To contact Stroke Continuity provider, please refer to [email or web address] After hours, contact General Neurology

## 2019-10-09 NOTE — Progress Notes (Signed)
[facility] PROGRESS NOTE  [name]:[id number] DOB: December 10, 1940 DOA: 09/30/2019 PCP: Unk Pinto, MD   Subjective  Denies any complaints today, she is awake, alert oriented x3. She was talking on the phone when I interviewed her, denies any complaints Good strength in upper extremities Nephrology, neurology, palliative and cardiology's help all appreciated.  Brief history  Eileen Perez the patient is a78 y.o.year-old who lives alone with ESRD on peritoneal dialysis, gastric carcinoid, gout, DM2   presenting to ED with confusion. She lives alone. Her grand daugther noted confusion over the weekend and her [facility] went to check on her on Tuesday and noted severe confusion. Her daughter [name] does not live with her tells me that a dialysis RN or a family member were helping with her dialysis. She was also eating poorly this past week.   In ED> head CT unrevealing, Ammonia level negative  Assessment and plan   Principal Problem:   Acute encephalopathy Active Problems:   Essential hypertension   Anemia of chronic Renal Dz   ESRD (end stage renal disease) (HCC)   Carcinoid tumor of stomach   DNR (do not resuscitate) discussion   Cerebral embolism with cerebral infarction    Acute CVA Patient was presented with acute confusional state. MRI showed multiple small areas of acute infarction bilaterally. Neurology consulted, stroke work-up is started. Aspirin increased to full dose, follow telemetry Telemetry shows no A. fib, 2D echo without intracardiac thrombus. Cardiology consulted for event monitor versus ILR.  Acute metabolic encephalopathy This is likely secondary to acute CVA B12, folate and TSH WNL.    ESRD (end stage renal disease) on peritoneal dialysis since 2017  SHPT - appreciate nephrology assisting with management- HD started 11/13- initially thought that she would continue to need HD - palliative care consulted by nephrology to help make  further decisions Family willing to take her home when help with peritoneal dialysis for her.  Diarrhea - Recent C diff colitis - abdomen non-tender, no leukocytosis of fevers thus likely not infectious - d/c'd Nepro- diarrhea has improved   Anemia of chronic disease - stable  Gout - cont allopurinol  Weight loss - per daughter she lost about 13 lb but has recently gained some back  Severe protein calorie malnutrition She weighs 46 kg, albumin is in the low side. Dietitian consulted for further evaluation.  DM2 - last A1c 6.5 in 4/20- not on medications at home so I suspect it is diet controlled- sugars have been normal and thus d/c'd  fingerstick's.  HLD Crestor  Carcinoid tumor of stomach  Code Status: Full Code Family Communication: Daughter wants her back home Disposition Plan: Remains inpatient Diet:  Diet Order            Diet renal with fluid restriction Fluid restriction: 1200 mL Fluid; Room service appropriate? Yes; Fluid consistency: Thin  Diet effective now              Consultants   Neuro.  Nephrology  Procedures  . None  Antibiotics   None   Objective   Vitals:   10/09/19 0548 10/09/19 0955  BP: 113/66   Pulse: 97   Resp: 18 18  Temp: 98.8 F (37.1 C) 98.6 F (37 C)  SpO2: 98% 98%    Intake/Output Summary (Last 24 hours) at 10/09/2019 1010 Last data filed at 10/09/2019 0900 Gross per 24 hour  Intake 857 ml  Output -407 ml  Net 1264 ml   Filed Weights   10/08/19  1245 10/08/19 1628 10/08/19 2109  Weight: 46.9 kg 46.9 kg 46.9 kg    Physical examination  General: Alert and awake, oriented x3, not in any acute distress. HEENT: anicteric sclera, pupils reactive to light and accommodation, EOMI CVS: S1-S2 clear, no murmur rubs or gallops Chest: clear to auscultation bilaterally, no wheezing, rales or rhonchi Abdomen: soft nontender, nondistended, normal bowel sounds, no organomegaly Extremities: no cyanosis, clubbing  or edema noted bilaterally Neuro: Cranial nerves II-XII intact, no focal neurological deficits  Reviewed data  Basic Metabolic Panel: Recent Labs  Lab 10/03/19 0434 10/05/19 0741 10/06/19 0804 10/08/19 0453 10/08/19 1307  NA 135 138 140 143 142  K 4.0 4.0 4.0 4.3 4.3  CL 92* 100 102 102 103  CO2 26 26 26 26 26   GLUCOSE 168* 119* 120* 117* 132*  BUN 40* 34* 60* 50* 59*  CREATININE 7.97* 6.20* 8.07* 6.36* 6.85*  CALCIUM 10.3 10.3 9.9 9.6 9.6  PHOS  --   --  4.5 2.9 3.2   Liver Function Tests: Recent Labs  Lab 10/06/19 0804 10/08/19 0453 10/08/19 1307  ALBUMIN 1.6* 1.5* 1.6*   No results for input(s): LIPASE, AMYLASE in the last 168 hours. No results for input(s): AMMONIA in the last 168 hours. CBC: Recent Labs  Lab 10/03/19 0434 10/06/19 0803 10/08/19 0453 10/08/19 1307  WBC 9.0 11.2* 12.8* 13.3*  HGB 9.5* 9.9* 9.0* 9.1*  HCT 32.9* 35.0* 31.5* 32.2*  MCV 101.9* 104.5* 103.3* 104.5*  PLT 301 242 183 193   Cardiac Enzymes: No results for input(s): CKTOTAL, CKMB, CKMBINDEX, TROPONINI in the last 168 hours. BNP (last 3 results) Recent Labs    08/28/19 1930  BNP 90.6    ProBNP (last 3 results) No results for input(s): PROBNP in the last 8760 hours.  CBG: Recent Labs  Lab 10/08/19 0651 10/08/19 1104 10/08/19 1654 10/08/19 2110 10/09/19 0847  GLUCAP 110* 132* 86 125* 124*    Micro Recent Results (from the past 240 hour(s))  SARS CORONAVIRUS 2 (TAT 6-24 HRS) Nasopharyngeal Nasopharyngeal Swab     Status: None   Collection Time: 09/30/19  7:10 PM   Specimen: Nasopharyngeal Swab  Result Value Ref Range Status   SARS Coronavirus 2 NEGATIVE NEGATIVE Final    Comment: (NOTE) SARS-CoV-2 target nucleic acids are NOT DETECTED. The SARS-CoV-2 RNA is generally detectable in upper and lower respiratory specimens during the acute phase of infection. Negative results do not preclude SARS-CoV-2 infection, do not rule out co-infections with other pathogens, and  should not be used as the sole basis for treatment or other patient management decisions. Negative results must be combined with clinical observations, patient history, and epidemiological information. The expected result is Negative. Fact Sheet for Patients: SugarRoll.be Fact Sheet for Healthcare Providers: https://www.woods-mathews.com/ This test is not yet approved or cleared by the [place] FDA and  has been authorized for detection and/or diagnosis of SARS-CoV-2 by FDA under an Emergency Use Authorization (EUA). This EUA will remain  in effect (meaning this test can be used) for the duration of the COVID-19 declaration under Section 56 4(b)(1) of the Act, 21 U.S.C. section 360bbb-3(b)(1), unless the authorization is terminated or revoked sooner. Performed at [facility], [place] [address] [place], [place] [address]   Surgical pcr screen     Status: None   Collection Time: 10/01/19  1:44 AM   Specimen: Nasal Mucosa; Nasal Swab  Result Value Ref Range Status   MRSA, PCR NEGATIVE NEGATIVE Final  Staphylococcus aureus NEGATIVE NEGATIVE Final    Comment: (NOTE) The Xpert SA Assay (FDA approved for NASAL specimens in patients 66 years of age and older), is one component of a comprehensive surveillance program. It is not intended to diagnose infection nor to guide or monitor treatment. Performed at [facility], [place] [address] [place], [place] [address]      Radiological studies, reviewed  Mr Virgel Paling JQ Contrast  Result Date: 10/08/2019 CLINICAL DATA:  Confusion EXAM: MRA HEAD WITHOUT CONTRAST TECHNIQUE: Angiographic images of the Circle of Willis were obtained using MRA technique without intravenous contrast. COMPARISON:  None. FINDINGS: Intracranial internal carotid arteries, middle cerebral arteries, and anterior cerebral arteries are patent. There is a patent left posterior communicating artery. Intracranial  vertebral arteries, posterior inferior cerebellar arteries basilar artery, and posterior cerebral arteries are patent. No significant stenosis or aneurysm. IMPRESSION: No proximal intracranial vessel occlusion or significant stenosis. Electronically Signed   By: Macy Mis M.D.   On: 10/08/2019 07:12   Mr Brain Wo Contrast  Result Date: 10/07/2019 CLINICAL DATA:  Altered level of consciousness. ESRD on peritoneal dialysis. Confusion. EXAM: MRI HEAD WITHOUT CONTRAST TECHNIQUE: Multiplanar, multiecho pulse sequences of the brain and surrounding structures were obtained without intravenous contrast. COMPARISON:  CT head 09/30/2019 FINDINGS: Brain: Multiple small areas of restricted diffusion are present compatible with acute infarct. Small acute infarcts in the cerebellum bilaterally. Scattered small acute cortical infarcts in the parietal lobes bilaterally. Acute infarct in the left frontal white matter and adjacent cortex. No associated hemorrhage. Moderate atrophy without hydrocephalus.  Negative for mass or edema. Vascular: Normal arterial flow voids. Skull and upper cervical spine: No focal skeletal lesion. Sinuses/Orbits: Surgery. Mucosal edema paranasal sinuses. Bilateral cataract Other: None IMPRESSION: Multiple small areas of acute infarction bilaterally in different vascular distributions, suggestive of multiple small emboli. No associated hemorrhage Moderate atrophy Electronically Signed   By: Franchot Gallo M.D.   On: 10/07/2019 11:26   Vas US Carotid  Result Date: 10/08/2019 Carotid Arterial Duplex Study Indications:       CVA. Risk Factors:      None. Limitations        Today's exam was limited due to a central line and Bandages. Comparison Study:  No prior studies. Performing Technologist: Oliver Hum RVT  Examination Guidelines: A complete evaluation includes B-mode imaging, spectral Doppler, color Doppler, and power Doppler as needed of all accessible portions of each vessel.  Bilateral testing is considered an integral part of a complete examination. Limited examinations for reoccurring indications may be performed as noted.  Right Carotid Findings: +----------+--------+--------+--------+--------------------------+--------+           PSV cm/sEDV cm/sStenosisPlaque Description        Comments +----------+--------+--------+--------+--------------------------+--------+ CCA Prox  83      13              smooth and heterogenous            +----------+--------+--------+--------+--------------------------+--------+ CCA Distal55      11              smooth and heterogenous            +----------+--------+--------+--------+--------------------------+--------+ ICA Prox  47      13              irregular and heterogenoustortuous +----------+--------+--------+--------+--------------------------+--------+ ICA Distal71      25  tortuous +----------+--------+--------+--------+--------------------------+--------+ ECA       84      0                                                  +----------+--------+--------+--------+--------------------------+--------+ +----------+--------+-------+------------+-------------------+           PSV cm/sEDV cmsDescribe    Arm Pressure (mmHG) +----------+--------+-------+------------+-------------------+ Subclavian               Not assessed                    +----------+--------+-------+------------+-------------------+ +---------+--------+--+--------+--+---------+ VertebralPSV cm/s77EDV cm/s17Antegrade +---------+--------+--+--------+--+---------+  Left Carotid Findings: +----------+--------+--------+--------+-----------------------+--------+           PSV cm/sEDV cm/sStenosisPlaque Description     Comments +----------+--------+--------+--------+-----------------------+--------+ CCA Prox  67      13              smooth and heterogenous          +----------+--------+--------+--------+-----------------------+--------+ CCA Distal61      16              smooth and heterogenous         +----------+--------+--------+--------+-----------------------+--------+ ICA Prox  71      20              smooth and heterogenoustortuous +----------+--------+--------+--------+-----------------------+--------+ ICA Distal86      27                                     tortuous +----------+--------+--------+--------+-----------------------+--------+ ECA       72      4                                               +----------+--------+--------+--------+-----------------------+--------+ +----------+--------+--------+--------+-------------------+           PSV cm/sEDV cm/sDescribeArm Pressure (mmHG) +----------+--------+--------+--------+-------------------+ [id number]                                         +----------+--------+--------+--------+-------------------+ +---------+--------+---+--------+--+---------+ VertebralPSV cm/s116EDV cm/s32Antegrade +---------+--------+---+--------+--+---------+  Summary: Right Carotid: Velocities in the right ICA are consistent with a 1-39% stenosis. Left Carotid: Velocities in the left ICA are consistent with a 1-39% stenosis. Vertebrals: Bilateral vertebral arteries demonstrate antegrade flow. *See table(s) above for measurements and observations.     Preliminary     Scheduled Meds: . allopurinol  300 mg Oral Daily  . aspirin EC  81 mg Oral QHS  . Chlorhexidine Gluconate Cloth  6 each Topical Daily  . Chlorhexidine Gluconate Cloth  6 each Topical Q0600  . clopidogrel  75 mg Oral Daily  . darbepoetin (ARANESP) injection - DIALYSIS  40 mcg Intravenous Q Mon-HD  . feeding supplement (NEPRO CARB STEADY)  237 mL Oral TID BM  . feeding supplement (PRO-STAT SUGAR FREE 64)  30 mL Oral BID  . insulin aspart  0-9 Units Subcutaneous TID WC  . lidocaine  15 mL Mouth/Throat TID AC  .  multivitamin  1 tablet Oral QHS  . omega-3 acid ethyl esters  1 g Oral Daily  . pantoprazole  40 mg Oral BID   Continuous Infusions: . dialysis solution 1.5% low-MG/low-CA       Time spent: 35 minutes  [facility] Pager [phone] If 7PM-7AM, please contact night-coverage at [email or web address], password [facility] 10/09/2019, 10:10 AM  LOS: 8 days

## 2019-10-09 NOTE — Progress Notes (Addendum)
Subjective:  No cos, no changes in pleasant confusion ,doesn't remember HD yesterday . Noted family plans to be here from Out of town Saturday and dc home with Granddaughter- HD yest-  No volume removal    Objective Vital signs in last 24 hours: Vitals:   10/08/19 1628 10/08/19 1655 10/08/19 2109 10/09/19 0548  BP: (!) 111/57 120/74 (!) 109/55 113/66  Pulse: 94 97 98 97  Resp: 16 18 18 18   Temp: 97.9 F (36.6 C)  99.1 F (37.3 C) 98.8 F (37.1 C)  TempSrc: Oral  Oral Oral  SpO2: 98% 99% 98% 98%  Weight: 46.9 kg  46.9 kg    Weight change: 1.3 kg  Physical Exam: General=alert,elderly female pleasantlydisoriented  No significant  Change  ,NAD Chest=CTA  Card=RRR no MRG Abd=soft + BS ,ntnd LLQ PD cath Ext=no LEedema/ R IJ Perm cath   Home meds: - allopurinol 300/ pantoprazole 40 bid - rosuvastatin 5 qd/ Kdur 20 qd - prn's/ vitamins/ supplements  OutptPD: 5 exchanges, 2500 dwell, 2 hr each, no day bag, edw 48.5kg - started in 2017, last clearance here was 2.05 on 08/2019.   Problem/Plan 1.AMS/ confusion/ Acute CVA  On MRI  - . Neuro  Seeing now ,CVA  Workup in progress  - multiple punctate appear to be embolic infarcts  2. ESRD - Was on PD x 3 yrs. / not able to preform now with CVA  transition to HD trial for now.awaiting  clip for op hd- will run as transient locally until moves with son to [place] 3. Volume/BP- no vol excess on exam, under dry wt/ bp stable without meds- Does not need much UF-  no BP meds  4. Anemia ckd -HGB 9.1<9.9Aranesp 11/16 hd 40 mcg q wk hd op iron studies =09/23/19 22% Ferritin 1155haveStartedvenofer x 3  5. Secondary Hyperparathyroidism -phos 3.2 Ca Corec 11.5 on no meds/ use low ca bath 6. DMT2 - per admit  7. Recent C Diff - diarrhea has improved 8. Hypoalbuminemia- sec Combination PD , decreased appetite with uremia /Nepro started 11/16, held neprofor diarrhea ho  9. Diarrhea recent c diff  admit /  no diarrhea  This am per RN   [name], PA-C [place] [phone] 10/09/2019,9:39 AM  LOS: 8 days    Patient seen and examined, agree with above note with above modifications. Ultimate plan is to send patient home with granddaughter once stroke work up is complete and OP HD is arranged.  Then likely long term to go to [place] with son -  Looking at OP HD units close to where he lives.  Patient is nervous but trusts the people who are handling things.  HD tomorrow  Corliss Parish, MD 10/09/2019     Labs: Basic Metabolic Panel: Recent Labs  Lab 10/06/19 0804 10/08/19 0453 10/08/19 1307  NA 140 143 142  K 4.0 4.3 4.3  CL 102 102 103  CO2 26 26 26   GLUCOSE 120* 117* 132*  BUN 60* 50* 59*  CREATININE 8.07* 6.36* 6.85*  CALCIUM 9.9 9.6 9.6  PHOS 4.5 2.9 3.2   Liver Function Tests: Recent Labs  Lab 10/06/19 0804 10/08/19 0453 10/08/19 1307  ALBUMIN 1.6* 1.5* 1.6*   No results for input(s): LIPASE, AMYLASE in the last 168 hours. No results for input(s): AMMONIA in the last 168 hours. CBC: Recent Labs  Lab 10/03/19 0434 10/06/19 0803 10/08/19 0453 10/08/19 1307  WBC 9.0 11.2* 12.8* 13.3*  HGB 9.5* 9.9* 9.0* 9.1*  HCT 32.9* 35.0* 31.5* 32.2*  MCV 101.9* 104.5* 103.3* 104.5*  PLT 301 242 183 193   Cardiac Enzymes: No results for input(s): CKTOTAL, CKMB, CKMBINDEX, TROPONINI in the last 168 hours. CBG: Recent Labs  Lab 10/08/19 0651 10/08/19 1104 10/08/19 1654 10/08/19 2110 10/09/19 0847  GLUCAP 110* 132* 86 125* 124*    Studies/Results: Mr Jodene Nam Head Wo Contrast  Result Date: 10/08/2019 CLINICAL DATA:  Confusion EXAM: MRA HEAD WITHOUT CONTRAST TECHNIQUE: Angiographic images of the Circle of Willis were obtained using MRA technique without intravenous contrast. COMPARISON:  None. FINDINGS: Intracranial internal carotid arteries, middle cerebral arteries, and anterior cerebral arteries are patent. There is a patent left  posterior communicating artery. Intracranial vertebral arteries, posterior inferior cerebellar arteries basilar artery, and posterior cerebral arteries are patent. No significant stenosis or aneurysm. IMPRESSION: No proximal intracranial vessel occlusion or significant stenosis. Electronically Signed   By: Macy Mis M.D.   On: 10/08/2019 07:12   Mr Brain Wo Contrast  Result Date: 10/07/2019 CLINICAL DATA:  Altered level of consciousness. ESRD on peritoneal dialysis. Confusion. EXAM: MRI HEAD WITHOUT CONTRAST TECHNIQUE: Multiplanar, multiecho pulse sequences of the brain and surrounding structures were obtained without intravenous contrast. COMPARISON:  CT head 09/30/2019 FINDINGS: Brain: Multiple small areas of restricted diffusion are present compatible with acute infarct. Small acute infarcts in the cerebellum bilaterally. Scattered small acute cortical infarcts in the parietal lobes bilaterally. Acute infarct in the left frontal white matter and adjacent cortex. No associated hemorrhage. Moderate atrophy without hydrocephalus.  Negative for mass or edema. Vascular: Normal arterial flow voids. Skull and upper cervical spine: No focal skeletal lesion. Sinuses/Orbits: Surgery. Mucosal edema paranasal sinuses. Bilateral cataract Other: None IMPRESSION: Multiple small areas of acute infarction bilaterally in different vascular distributions, suggestive of multiple small emboli. No associated hemorrhage Moderate atrophy Electronically Signed   By: Franchot Gallo M.D.   On: 10/07/2019 11:26   Vas US Carotid  Result Date: 10/08/2019 Carotid Arterial Duplex Study Indications:       CVA. Risk Factors:      None. Limitations        Today's exam was limited due to a central line and Bandages. Comparison Study:  No prior studies. Performing Technologist: Oliver Hum RVT  Examination Guidelines: A complete evaluation includes B-mode imaging, spectral Doppler, color Doppler, and power Doppler as needed of all  accessible portions of each vessel. Bilateral testing is considered an integral part of a complete examination. Limited examinations for reoccurring indications may be performed as noted.  Right Carotid Findings: +----------+--------+--------+--------+--------------------------+--------+           PSV cm/sEDV cm/sStenosisPlaque Description        Comments +----------+--------+--------+--------+--------------------------+--------+ CCA Prox  83      13              smooth and heterogenous            +----------+--------+--------+--------+--------------------------+--------+ CCA Distal55      11              smooth and heterogenous            +----------+--------+--------+--------+--------------------------+--------+ ICA Prox  47      13              irregular and heterogenoustortuous +----------+--------+--------+--------+--------------------------+--------+ ICA Distal71      25  tortuous +----------+--------+--------+--------+--------------------------+--------+ ECA       84      0                                                  +----------+--------+--------+--------+--------------------------+--------+ +----------+--------+-------+------------+-------------------+           PSV cm/sEDV cmsDescribe    Arm Pressure (mmHG) +----------+--------+-------+------------+-------------------+ Subclavian               Not assessed                    +----------+--------+-------+------------+-------------------+ +---------+--------+--+--------+--+---------+ VertebralPSV cm/s77EDV cm/s17Antegrade +---------+--------+--+--------+--+---------+  Left Carotid Findings: +----------+--------+--------+--------+-----------------------+--------+           PSV cm/sEDV cm/sStenosisPlaque Description     Comments +----------+--------+--------+--------+-----------------------+--------+ CCA Prox  67      13              smooth and  heterogenous         +----------+--------+--------+--------+-----------------------+--------+ CCA Distal61      16              smooth and heterogenous         +----------+--------+--------+--------+-----------------------+--------+ ICA Prox  71      20              smooth and heterogenoustortuous +----------+--------+--------+--------+-----------------------+--------+ ICA Distal86      27                                     tortuous +----------+--------+--------+--------+-----------------------+--------+ ECA       72      4                                               +----------+--------+--------+--------+-----------------------+--------+ +----------+--------+--------+--------+-------------------+           PSV cm/sEDV cm/sDescribeArm Pressure (mmHG) +----------+--------+--------+--------+-------------------+ [id number]                                         +----------+--------+--------+--------+-------------------+ +---------+--------+---+--------+--+---------+ VertebralPSV cm/s116EDV cm/s32Antegrade +---------+--------+---+--------+--+---------+  Summary: Right Carotid: Velocities in the right ICA are consistent with a 1-39% stenosis. Left Carotid: Velocities in the left ICA are consistent with a 1-39% stenosis. Vertebrals: Bilateral vertebral arteries demonstrate antegrade flow. *See table(s) above for measurements and observations.     Preliminary    Medications: . dialysis solution 1.5% low-MG/low-CA     . allopurinol  300 mg Oral Daily  . aspirin EC  81 mg Oral QHS  . Chlorhexidine Gluconate Cloth  6 each Topical Daily  . Chlorhexidine Gluconate Cloth  6 each Topical Q0600  . clopidogrel  75 mg Oral Daily  . darbepoetin (ARANESP) injection - DIALYSIS  40 mcg Intravenous Q Mon-HD  . feeding supplement (NEPRO CARB STEADY)  237 mL Oral TID BM  . feeding supplement (PRO-STAT SUGAR FREE 64)  30 mL Oral BID  . insulin aspart  0-9 Units  Subcutaneous TID WC  . lidocaine  15 mL Mouth/Throat TID AC  . multivitamin  1 tablet Oral QHS  . omega-3 acid ethyl esters  1  g Oral Daily  . pantoprazole  40 mg Oral BID

## 2019-10-09 NOTE — Progress Notes (Signed)
Renal Navigator submitted OP HD referral to [facility]missions and will continue to follow closely.   Alphonzo Cruise, [place] [facility] [phone]

## 2019-10-09 NOTE — Progress Notes (Signed)
Renal Navigator has discussed patient's care with Dr. 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 She states she has spoken with patient's daughter/Ms. Fabio Neighbors.  Renal Navigator called Ms. Fabio Neighbors to discuss plan of care for patient as well as update her on decision regarding hospital visitation. Ms. Fabio Neighbors was pleasant and appreciative of call. She states understanding that her mother needs to continue HD at discharge and that this can be a trial, with continued evaluation of her quality of life. She would like patient to be set up at an OP HD clinic in [place] while the family makes arrangements for her mother to be moved to patient's son's home near [place], [place]. She reports that her brother is checking in to the OP HD clinics near his home to see which one he feels would suit their mother the best. Renal Navigator will refer patient to a small unit in [place], as transportation is not an issue, so proximity does not matter to the family. Patient's daughter is greatly appreciative. She states that her daughter will stay in [place] with her mother until the move to [place] can take place.  Ms. Fabio Neighbors is unsure when her brother plans to come to [facility], but was informed by Renal Navigator that only one family member can visit patient per day in the hospital. She was extremely thankful for this decision.   Alphonzo Cruise, [place] Renal Navigator [phone]

## 2019-10-09 NOTE — Progress Notes (Signed)
Patient has been accepted for OP HD treatment at [facility] on a MWF schedule with a seat time of 12:30pm. She needs to arrive to her appointments at 12:10pm. On her first day of treatment, she needs to arrive at 11:30am to complete intake paperwork prior to her first appointment.  Renal Navigator notified Nephrologist/Dr. Moshe Cipro and patient's daughter/Ms. Fabio Neighbors. Renal Navigator following for discharge readiness and will assist with smooth transition from hospital to OP HD clinic.   Alphonzo Cruise, [place] Renal Navigator [phone]

## 2019-10-09 NOTE — Progress Notes (Signed)
PMT follow-up.  Daughter, [name] requesting family conference call this weekend with palliative provider. They are considering MOST form and limitations to care and would like to further discuss. This PMT provider requesting Florentina Jenny, PA to f/u on Saturday 11/21. Patient and family known to [place] from discussions last weekend. Discussed plan of care with [name] this afternoon.   Granddaughter to arrive to [place] on Saturday. Family does NOT want her discharging to SNF. Family wishes for her to discharge home with granddaughter until arrangements can be made to move her to [place] with son.   Also discussed with Jaclyn Shaggy, SW renal navigator.    Non-face-to-face time: 20 minutes  Ihor Dow, DNP, FNP-C Palliative Medicine Team  Phone: [phone] Fax: [phone]

## 2019-10-09 NOTE — Consult Note (Addendum)
ELECTROPHYSIOLOGY CONSULT NOTE  Patient ID: Eileen Perez MRN: [id number], DOB/AGE: 1941/04/22   Admit date: 09/30/2019 Date of Consult: 10/09/2019  Primary Physician: Unk Pinto, MD Primary Cardiologist: No primary care provider on file.  Primary Electrophysiologist: New to [facility] Reason for Consultation: Cryptogenic stroke; recommendations regarding Implantable Loop Recorder  History of Present Illness EP has been asked to evaluate [place] for placement of an implantable loop recorder to monitor for atrial fibrillation by Dr Erlinda Hong.  The patient was admitted on 09/30/2019 with increasing confusion.  They first developed symptoms while at home.  Imaging demonstrated non acute CT. MRi showed multiple bilatearly small anterior and posterior infarcts. MRA unremarkable. Carotid dopplers B ICA 1-39%, with VAs antegrade.  They have undergone workup for stroke including echocardiogram and carotid dopplers.  The patient has been monitored on telemetry which has demonstrated sinus rhythm with no arrhythmias.  Inpatient stroke work-up Micha Erck not require a TEE per Neurology.   Echocardiogram this admission demonstrated LVEF 60-65% and no obvious thrombus.  Lab work is reviewed.  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 Difficile, still on vanc.   Prior to admission, the patient denies chest pain, shortness of breath, dizziness, palpitations, or syncope.  They are recovering from their stroke with plans to rehab at SNF vs home with 24 hr care at discharge.  Past Medical History:  Diagnosis Date   Anemia    Arthritis    Osteoarthritis   Chronic kidney disease    Chronic renal insufficiency, peritoneal dialysis qday sees dr Moshe Cipro   Diabetes mellitus    Pre   Diverticulosis    GERD (gastroesophageal reflux disease)    Gout     Hiatal hernia    History of blood transfusion    Hyperlipidemia    Hypertension    Osteopenia    Pancreatic cyst 1999   Thyroid disease    Hyperparathyroid      Surgical History:  Past Surgical History:  Procedure Laterality Date   BIOPSY  04/25/2019   Procedure: BIOPSY;  Surgeon: Otis Brace, MD;  Location: [facility];  Service: Gastroenterology;;   BIOPSY  09/02/2019   Procedure: BIOPSY;  Surgeon: Otis Brace, MD;  Location: [place];  Service: Gastroenterology;;   CHOLECYSTECTOMY     COLONOSCOPY  03/21/12   Next one in 2018   COLONOSCOPY WITH PROPOFOL N/A 04/25/2019   Procedure: COLONOSCOPY WITH PROPOFOL;  Surgeon: Otis Brace, MD;  Location: [facility];  Service: Gastroenterology;  Laterality: N/A;   ESOPHAGOGASTRODUODENOSCOPY (EGD) WITH PROPOFOL N/A 04/25/2019   Procedure: ESOPHAGOGASTRODUODENOSCOPY (EGD) WITH PROPOFOL;  Surgeon: Otis Brace, MD;  Location: [facility];  Service: Gastroenterology;  Laterality: N/A;   ESOPHAGOGASTRODUODENOSCOPY (EGD) WITH PROPOFOL N/A 09/02/2019   Procedure: ESOPHAGOGASTRODUODENOSCOPY (EGD) WITH PROPOFOL;  Surgeon: Otis Brace, MD;  Location: [place];  Service: Gastroenterology;  Laterality: N/A;   EYE SURGERY Left    cataract extraction with IOL   INSERTION OF DIALYSIS CATHETER N/A 02/15/2017   Procedure: REVISION OF DIALYSIS CATHETER;  Surgeon: Algernon Huxley, MD;  Location: [facility];  Service: Cardiovascular;  Laterality: N/A;   IR FLUORO GUIDE CV LINE RIGHT  08/29/2019   IR FLUORO GUIDE CV LINE RIGHT  10/03/2019   IR US GUIDE VASC ACCESS RIGHT  08/29/2019   IR US GUIDE VASC [place] RIGHT  10/03/2019   JOINT REPLACEMENT  2012   left knee  left knee replacement      PANCREATIC CYST EXCISION  1999   POLYPECTOMY  04/25/2019   Procedure: POLYPECTOMY;  Surgeon: Otis Brace, MD;  Location: [facility];  Service: Gastroenterology;;   Clide Deutscher  04/25/2019   Procedure: Clide Deutscher;   Surgeon: Otis Brace, MD;  Location: [facility];  Service: Gastroenterology;;   TOTAL HIP ARTHROPLASTY Right 05/26/2015   Procedure: RIGHT TOTAL HIP ARTHROPLASTY ANTERIOR APPROACH;  Surgeon: Gaynelle Arabian, MD;  Location: [facility];  Service: Orthopedics;  Laterality: Right;   TOTAL KNEE ARTHROPLASTY Right 06/09/2013   Procedure: RIGHT TOTAL KNEE ARTHROPLASTY;  Surgeon: Gearlean Alf, MD;  Location: [facility];  Service: Orthopedics;  Laterality: Right;     Medications Prior to Admission  Medication Sig Dispense Refill Last Dose   allopurinol (ZYLOPRIM) 300 MG tablet Take 1 tablet daily for gout 90 tablet 1 09/30/2019 at Unknown time   alum & mag hydroxide-simeth (MAALOX/MYLANTA) 200-200-20 MG/5ML suspension Take 15 mLs by mouth 3 (three) times daily before meals. 1350 mL 0 09/30/2019 at Unknown time   aspirin 81 MG tablet Take 81 mg by mouth at bedtime.    09/29/2019 at Unknown time   b complex-vitamin c-folic acid (NEPHRO-VITE) 0.8 MG TABS tablet Take 1 tablet by mouth daily.    09/30/2019 at Unknown time   Blood Glucose Monitoring Suppl DEVI Check blood glucose before meals, at bedtime and as needed. 1 each 0    gentamicin cream (GARAMYCIN) 0.1 % Apply 1 application topically daily. (Patient taking differently: Apply 1 application topically every evening. ) 15 g 0 09/29/2019 at Unknown time   glucose blood test strip Use as instructed 100 each 4    Lancets (ACCU-CHEK SOFT TOUCH) lancets Use as instructed 100 each 12    lidocaine (XYLOCAINE) 2 % solution Use as directed 15 mLs in the mouth or throat 3 (three) times daily before meals. 1350 mL 0 09/30/2019 at Unknown time   Nutritional Supplements (FEEDING SUPPLEMENT, NEPRO CARB STEADY,) LIQD Take 237 mLs by mouth 3 (three) times daily between meals. 237 mL 12 09/29/2019 at Unknown time   ondansetron (ZOFRAN-ODT) 4 MG disintegrating tablet Take 1 tablet every 8 hours as needed for nausea and vomiting. 90 tablet 1 09/29/2019 at Unknown  time   pantoprazole (PROTONIX) 40 MG tablet Takes 1 tablet twice a day for reflux. (Patient taking differently: Take 40 mg by mouth 2 (two) times daily. ) 60 tablet 0 09/30/2019 at Unknown time   Potassium Chloride ER 20 MEQ TBCR Takes 1 tablet daily. (Patient taking differently: Take 20 mEq by mouth daily. Takes 1 tablet daily.) 90 tablet 1 09/30/2019 at Unknown time   rosuvastatin (CRESTOR) 5 MG tablet Take 1 tablet after supper for cholesterol. (Patient taking differently: Take 5 mg by mouth daily after supper. Take 1 tablet after supper for cholesterol.) 90 tablet 1 unknown   Vancomycin HCl 50 MG/ML SOLR Take 2.5 mLs by mouth every 6 (six) hours. For 10 days   09/30/2019 at Unknown time   ZINC GLUCONATE PO Take 40 mg by mouth daily.    09/30/2019 at Unknown time    Inpatient Medications:   allopurinol  300 mg Oral Daily   aspirin EC  81 mg Oral QHS   Chlorhexidine Gluconate Cloth  6 each Topical Daily   Chlorhexidine Gluconate Cloth  6 each Topical Q0600   darbepoetin (ARANESP) injection - DIALYSIS  40 mcg Intravenous Q Mon-HD   feeding supplement (NEPRO CARB STEADY)  237 mL Oral TID  BM   feeding supplement (PRO-STAT SUGAR FREE 64)  30 mL Oral BID   insulin aspart  0-9 Units Subcutaneous TID WC   lidocaine  15 mL Mouth/Throat TID AC   multivitamin  1 tablet Oral QHS   omega-3 acid ethyl esters  1 g Oral Daily   pantoprazole  40 mg Oral BID   rosuvastatin  5 mg Oral q1800    Allergies:  Allergies  Allergen Reactions   Ace Inhibitors Other (See Comments)    unknown   Nsaids Other (See Comments)    unknown    Social History   Socioeconomic History   Marital status: Divorced    Spouse name: Not on file   Number of children: 3   Years of education: Not on file   Highest education level: Not on file  Occupational History   Occupation: Retired     Comment: Chiropractor strain: Not on file   Food insecurity    Worry:  Not on file    Inability: Not on Lexicographer needs    Medical: Not on file    Non-medical: Not on file  Tobacco Use   Smoking status: Never Smoker   Smokeless tobacco: Never Used  Substance and Sexual Activity   Alcohol use: No    Alcohol/week: 0.0 standard drinks   Drug use: No   Sexual activity: Not on file  Lifestyle   Physical activity    Days per week: Not on file    Minutes per session: Not on file   Stress: Not on file  Relationships   Social connections    Talks on phone: Not on file    Gets together: Not on file    Attends religious service: Not on file    Active member of club or organization: Not on file    Attends meetings of clubs or organizations: Not on file    Relationship status: Not on file   Intimate partner violence    Fear of current or ex partner: Not on file    Emotionally abused: Not on file    Physically abused: Not on file    Forced sexual activity: Not on file  Other Topics Concern   Not on file  Social History Narrative   Not on file     Family History  Problem Relation Age of Onset   Cancer Father        Prostate and Throat   Hypertension Mother    Cancer Mother        Colon with METS   Diabetes Mother       Review of Systems: All other systems reviewed and are otherwise negative except as noted above.  Physical Exam: Vitals:   10/08/19 1628 10/08/19 1655 10/08/19 2109 10/09/19 0548  BP: (!) 111/57 120/74 (!) 109/55 113/66  Pulse: 94 97 98 97  Resp: 16 18 18 18   Temp: 97.9 F (36.6 C)  99.1 F (37.3 C) 98.8 F (37.1 C)  TempSrc: Oral  Oral Oral  SpO2: 98% 99% 98% 98%  Weight: 46.9 kg  46.9 kg     GEN- The patient is well appearing, alert and oriented x 3 today.   Head- normocephalic, atraumatic Eyes-  Sclera clear, conjunctiva pink Ears- hearing intact Oropharynx- clear Neck- supple Lungs- Clear to ausculation bilaterally, normal work of breathing Heart- Regular rate and rhythm, no murmurs,  rubs or gallops  GI- soft, NT, ND, + BS  Extremities- no clubbing, cyanosis, or edema MS- no significant deformity or atrophy Skin- no rash or lesion Psych- euthymic mood, full affect   Labs:   Lab Results  Component Value Date   WBC 13.3 (H) 10/08/2019   HGB 9.1 (L) 10/08/2019   HCT 32.2 (L) 10/08/2019   MCV 104.5 (H) 10/08/2019   PLT 193 10/08/2019    Recent Labs  Lab 10/08/19 1307  NA 142  K 4.3  CL 103  CO2 26  BUN 59*  CREATININE 6.85*  CALCIUM 9.6  GLUCOSE 132*     Radiology/Studies: Ct Head Wo Contrast  Result Date: 09/30/2019 CLINICAL DATA:  Altered level of consciousness. EXAM: CT HEAD WITHOUT CONTRAST TECHNIQUE: Contiguous axial images were obtained from the base of the skull through the vertex without intravenous contrast. COMPARISON:  September 10, 2015. FINDINGS: Brain: Mild diffuse cortical atrophy is noted. Mild chronic ischemic white matter disease is noted. No mass effect or midline shift is noted. Ventricular size is within normal limits. There is no evidence of mass lesion, hemorrhage or acute infarction. Vascular: No hyperdense vessel or unexpected calcification. Skull: Normal. Negative for fracture or focal lesion. Sinuses/Orbits: Stable left maxillary mucous retention cyst is noted. Other: None. IMPRESSION: Mild diffuse cortical atrophy. Mild chronic ischemic white matter disease. No acute intracranial abnormality seen. Electronically Signed   By: Marijo Conception M.D.   On: 09/30/2019 15:12   Mr Jodene Nam Head Wo Contrast  Result Date: 10/08/2019 CLINICAL DATA:  Confusion EXAM: MRA HEAD WITHOUT CONTRAST TECHNIQUE: Angiographic images of the Circle of Willis were obtained using MRA technique without intravenous contrast. COMPARISON:  None. FINDINGS: Intracranial internal carotid arteries, middle cerebral arteries, and anterior cerebral arteries are patent. There is a patent left posterior communicating artery. Intracranial vertebral arteries, posterior inferior  cerebellar arteries basilar artery, and posterior cerebral arteries are patent. No significant stenosis or aneurysm. IMPRESSION: No proximal intracranial vessel occlusion or significant stenosis. Electronically Signed   By: Macy Mis M.D.   On: 10/08/2019 07:12   Mr Brain Wo Contrast  Result Date: 10/07/2019 CLINICAL DATA:  Altered level of consciousness. ESRD on peritoneal dialysis. Confusion. EXAM: MRI HEAD WITHOUT CONTRAST TECHNIQUE: Multiplanar, multiecho pulse sequences of the brain and surrounding structures were obtained without intravenous contrast. COMPARISON:  CT head 09/30/2019 FINDINGS: Brain: Multiple small areas of restricted diffusion are present compatible with acute infarct. Small acute infarcts in the cerebellum bilaterally. Scattered small acute cortical infarcts in the parietal lobes bilaterally. Acute infarct in the left frontal white matter and adjacent cortex. No associated hemorrhage. Moderate atrophy without hydrocephalus.  Negative for mass or edema. Vascular: Normal arterial flow voids. Skull and upper cervical spine: No focal skeletal lesion. Sinuses/Orbits: Surgery. Mucosal edema paranasal sinuses. Bilateral cataract Other: None IMPRESSION: Multiple small areas of acute infarction bilaterally in different vascular distributions, suggestive of multiple small emboli. No associated hemorrhage Moderate atrophy Electronically Signed   By: Franchot Gallo M.D.   On: 10/07/2019 11:26   Ir Fluoro Guide Cv Line Right  Result Date: 10/03/2019 INDICATION: 78 year old female with a history of peritoneal hemodialysis EXAM: TUNNELED CENTRAL VENOUS HEMODIALYSIS CATHETER PLACEMENT WITH ULTRASOUND AND FLUOROSCOPIC GUIDANCE MEDICATIONS: 2 g Ancef. The antibiotic was given in an appropriate time interval prior to skin puncture. ANESTHESIA/SEDATION: Moderate (conscious) sedation was employed during this procedure. A total of Versed 1.0 mg and Fentanyl 25 mcg was administered intravenously.  Moderate Sedation Time: 13 minutes. The patient's level of consciousness and vital signs were monitored continuously by radiology  nursing throughout the procedure under my direct supervision. FLUOROSCOPY TIME:  Fluoroscopy Time: 0 minutes 6 seconds (1 mGy). COMPLICATIONS: None PROCEDURE: Informed written consent was obtained from the patient after a discussion of the risks, benefits, and alternatives to treatment. Questions regarding the procedure were encouraged and answered. The right neck and chest were prepped with chlorhexidine in a sterile fashion, and a sterile drape was applied covering the operative field. Maximum barrier sterile technique with sterile gowns and gloves were used for the procedure. A timeout was performed prior to the initiation of the procedure. After creating a small venotomy incision, a micropuncture kit was utilized to access the right internal jugular vein under direct, real-time ultrasound guidance after the overlying soft tissues were anesthetized with 1% lidocaine with epinephrine. Ultrasound image documentation was performed. The microwire was marked to measure appropriate internal catheter length. External tunneled length was estimated. A total tip to cuff length of 19 cm was selected. Skin and subcutaneous tissues of chest wall below the clavicle were generously infiltrated with 1% lidocaine for local anesthesia. A small stab incision was made with 11 blade scalpel. The selected hemodialysis catheter was tunneled in a retrograde fashion from the anterior chest wall to the venotomy incision. A guidewire was advanced to the level of the IVC and the micropuncture sheath was exchanged for a peel-away sheath. The catheter was then placed through the peel-away sheath with tips ultimately positioned within the superior aspect of the right atrium. Final catheter positioning was confirmed and documented with a spot radiographic image. The catheter aspirates and flushes normally. The  catheter was flushed with appropriate volume heparin dwells. The catheter exit site was secured with a 0-Prolene retention suture. The venotomy incision was closed Derma bond and sterile dressing. Dressings were applied at the chest wall. Patient tolerated the procedure well and remained hemodynamically stable throughout. No complications were encountered and no significant blood loss encountered. IMPRESSION: Status post right IJ tunneled hemodialysis catheter placement. Catheter ready for use. Signed, Dulcy Fanny. Dellia Nims, RPVI [facility] Radiology Electronically Signed   By: Corrie Mckusick D.O.   On: 10/03/2019 15:31   Ir US Guide Vasc Access Right  Result Date: 10/03/2019 INDICATION: 78 year old female with a history of peritoneal hemodialysis EXAM: TUNNELED CENTRAL VENOUS HEMODIALYSIS CATHETER PLACEMENT WITH ULTRASOUND AND FLUOROSCOPIC GUIDANCE MEDICATIONS: 2 g Ancef. The antibiotic was given in an appropriate time interval prior to skin puncture. ANESTHESIA/SEDATION: Moderate (conscious) sedation was employed during this procedure. A total of Versed 1.0 mg and Fentanyl 25 mcg was administered intravenously. Moderate Sedation Time: 13 minutes. The patient's level of consciousness and vital signs were monitored continuously by radiology nursing throughout the procedure under my direct supervision. FLUOROSCOPY TIME:  Fluoroscopy Time: 0 minutes 6 seconds (1 mGy). COMPLICATIONS: None PROCEDURE: Informed written consent was obtained from the patient after a discussion of the risks, benefits, and alternatives to treatment. Questions regarding the procedure were encouraged and answered. The right neck and chest were prepped with chlorhexidine in a sterile fashion, and a sterile drape was applied covering the operative field. Maximum barrier sterile technique with sterile gowns and gloves were used for the procedure. A timeout was performed prior to the initiation of  the procedure. After creating a small venotomy incision, a micropuncture kit was utilized to access the right internal jugular vein under direct, real-time ultrasound guidance after the overlying soft tissues were anesthetized with 1% lidocaine with epinephrine. Ultrasound image documentation was performed. The microwire was marked  to measure appropriate internal catheter length. External tunneled length was estimated. A total tip to cuff length of 19 cm was selected. Skin and subcutaneous tissues of chest wall below the clavicle were generously infiltrated with 1% lidocaine for local anesthesia. A small stab incision was made with 11 blade scalpel. The selected hemodialysis catheter was tunneled in a retrograde fashion from the anterior chest wall to the venotomy incision. A guidewire was advanced to the level of the IVC and the micropuncture sheath was exchanged for a peel-away sheath. The catheter was then placed through the peel-away sheath with tips ultimately positioned within the superior aspect of the right atrium. Final catheter positioning was confirmed and documented with a spot radiographic image. The catheter aspirates and flushes normally. The catheter was flushed with appropriate volume heparin dwells. The catheter exit site was secured with a 0-Prolene retention suture. The venotomy incision was closed Derma bond and sterile dressing. Dressings were applied at the chest wall. Patient tolerated the procedure well and remained hemodynamically stable throughout. No complications were encountered and no significant blood loss encountered. IMPRESSION: Status post right IJ tunneled hemodialysis catheter placement. Catheter ready for use. Signed, Dulcy Fanny. Dellia Nims, RPVI [facility] Radiology Electronically Signed   By: Corrie Mckusick D.O.   On: 10/03/2019 15:31   Dg Chest Port 1 View  Result Date: 09/30/2019 CLINICAL DATA:  Altered EXAM: PORTABLE CHEST  1 VIEW COMPARISON:  Chest radiograph 09/02/2019 FINDINGS: Heart size within normal limits.  Aortic atherosclerosis. There is no focal consolidation within the lungs. No evidence of pleural effusion or pneumothorax. No acute bony abnormality. Overlying monitoring leads. IMPRESSION: No evidence of acute cardiopulmonary disease. Aortic atherosclerosis. Electronically Signed   By: Kellie Simmering DO   On: 09/30/2019 15:38   Dg Abd Acute 2+v W 1v Chest  Result Date: 09/10/2019 CLINICAL DATA:  Concern for small bowel obstruction EXAM: DG ABDOMEN ACUTE W/ 1V CHEST COMPARISON:  Abdominal radiograph dated 09/05/2019 FINDINGS: There is no evidence of dilated bowel loops or free intraperitoneal air. Degenerative changes are seen in the left hip and lumbar spine. A right hip arthroplasty is seen. A peritoneal dialysis catheter is noted in the pelvis. A calcified fibroid is redemonstrated. Heart size and mediastinal contours are within normal limits. Vascular calcifications are seen in the aortic arch. Both lungs are clear. IMPRESSION: No evidence of bowel obstruction or free intraperitoneal air. No acute cardiopulmonary findings. Aortic Atherosclerosis (ICD10-I70.0). Electronically Signed   By: Zerita Boers M.D.   On: 09/10/2019 17:20   Vas US Carotid  Result Date: 10/08/2019 Carotid Arterial Duplex Study Indications:       CVA. Risk Factors:      None. Limitations        Today's exam was limited due to a central line and Bandages. Comparison Study:  No prior studies. Performing Technologist: Oliver Hum RVT  Examination Guidelines: A complete evaluation includes B-mode imaging, spectral Doppler, color Doppler, and power Doppler as needed of all accessible portions of each vessel. Bilateral testing is considered an integral part of a complete examination. Limited examinations for reoccurring indications may be performed as noted.  Right Carotid Findings:  +----------+--------+--------+--------+--------------------------+--------+             PSV cm/s EDV cm/s Stenosis Plaque Description         Comments  +----------+--------+--------+--------+--------------------------+--------+  CCA Prox   83       13  smooth and heterogenous              +----------+--------+--------+--------+--------------------------+--------+  CCA Distal 55       11                smooth and heterogenous              +----------+--------+--------+--------+--------------------------+--------+  ICA Prox   47       13                irregular and heterogenous tortuous  +----------+--------+--------+--------+--------------------------+--------+  ICA Distal 71       25                                           tortuous  +----------+--------+--------+--------+--------------------------+--------+  ECA        84       0                                                      +----------+--------+--------+--------+--------------------------+--------+ +----------+--------+-------+------------+-------------------+             PSV cm/s EDV cms Describe     Arm Pressure (mmHG)  +----------+--------+-------+------------+-------------------+  Subclavian                  Not assessed                      +----------+--------+-------+------------+-------------------+ +---------+--------+--+--------+--+---------+  Vertebral PSV cm/s 77 EDV cm/s 17 Antegrade  +---------+--------+--+--------+--+---------+  Left Carotid Findings: +----------+--------+--------+--------+-----------------------+--------+             PSV cm/s EDV cm/s Stenosis Plaque Description      Comments  +----------+--------+--------+--------+-----------------------+--------+  CCA Prox   67       13                smooth and heterogenous           +----------+--------+--------+--------+-----------------------+--------+  CCA Distal 61       16                smooth and heterogenous            +----------+--------+--------+--------+-----------------------+--------+  ICA Prox   71       20                smooth and heterogenous tortuous  +----------+--------+--------+--------+-----------------------+--------+  ICA Distal 86       27                                        tortuous  +----------+--------+--------+--------+-----------------------+--------+  ECA        72       4                                                   +----------+--------+--------+--------+-----------------------+--------+ +----------+--------+--------+--------+-------------------+             PSV cm/s EDV cm/s Describe Arm Pressure (mmHG)  +----------+--------+--------+--------+-------------------+  Subclavian 146                                             +----------+--------+--------+--------+-------------------+ +---------+--------+---+--------+--+---------+  Vertebral PSV cm/s 116 EDV cm/s 32 Antegrade  +---------+--------+---+--------+--+---------+  Summary: Right Carotid: Velocities in the right ICA are consistent with a 1-39% stenosis. Left Carotid: Velocities in the left ICA are consistent with a 1-39% stenosis. Vertebrals: Bilateral vertebral arteries demonstrate antegrade flow. *See table(s) above for measurements and observations.     Preliminary     12-lead ECG 09/30/2019 NSR at 99 bpm (personally reviewed) All prior EKG's in EPIC reviewed with no documented atrial fibrillation  Telemetry She is not currently connected; Spoke with central tele and she was initially on telemetry, but last data two days ago.  Unable to boot back her old tele data at this time.  Available strip shows NSR.   Assessment and Plan:   1. Cryptogenic stroke The patient presents with cryptogenic stroke.  The patient does not have a TEE planned.  I spoke at length with the patient about monitoring for afib with an implantable loop recorder.  Risks, benefits, and alteratives to implantable loop recorder were discussed with the patient  today and family today. Pt is planning to move to [place] with her son for more around the clock care after this admission. Thus, discussed event monitor in place of loop recorder, and close follow up with cardiology once she moves there.    With unclear plan for temporary SNF then moving to [place] vs straight to [place], at this time Tinley Rought plan 30 day event monitor.  Efrat Zuidema plan office follow up in 6 weeks if she does decide to stay in [place]. Otherwise, she should follow up with cardiology in [place] for further.   Shirley Friar, PA-C 10/09/2019 8:26 AM  I have seen and examined this patient with Oda Kilts.  Agree with above, note added to reflect my findings.  On exam, RRR, no murmurs, lungs clear.   Patient admitted with cryptogenic stroke.  She was having confusion but this has cleared.  Evaluated for a Linq monitoring.  She has end-stage renal disease and is on peritoneal dialysis.  She is also recovering from a C. difficile infection.  Due to that, I feel that monitoring with a 30-day monitor initially would be most beneficial.  Per family, she also may move to [place] to get help from closer family members.  We Jozi Malachi see her back in 6 weeks, and if there is no plan to move, Estephania Licciardi plan for Linq monitor implant at that time.  Ruben Mahler M. Veeda Virgo MD 10/09/2019 11:04 AM

## 2019-10-10 LAB — RENAL FUNCTION PANEL
Albumin: 1.6 g/dL — ABNORMAL LOW (ref 3.5–5.0)
Anion gap: 14 (ref 5–15)
BUN: 14 mg/dL (ref 8–23)
CO2: 25 mmol/L (ref 22–32)
Calcium: 7.9 mg/dL — ABNORMAL LOW (ref 8.9–10.3)
Chloride: 101 mmol/L (ref 98–111)
Creatinine, Ser: 2.18 mg/dL — ABNORMAL HIGH (ref 0.44–1.00)
GFR calc Af Amer: 24 mL/min — ABNORMAL LOW (ref >60)
GFR calc non Af Amer: 21 mL/min — ABNORMAL LOW (ref >60)
Glucose, Bld: 102 mg/dL — ABNORMAL HIGH (ref 70–99)
Phosphorus: 1.5 mg/dL — ABNORMAL LOW (ref 2.5–4.6)
Potassium: 3.5 mmol/L (ref 3.5–5.1)
Sodium: 140 mmol/L (ref 135–145)

## 2019-10-10 LAB — GLUCOSE, CAPILLARY
Glucose-Capillary: 122 mg/dL — ABNORMAL HIGH (ref 70–99)
Glucose-Capillary: 105 mg/dL — ABNORMAL HIGH (ref 70–99)
Glucose-Capillary: 132 mg/dL — ABNORMAL HIGH (ref 70–99)
Glucose-Capillary: 114 mg/dL — ABNORMAL HIGH (ref 70–99)

## 2019-10-10 LAB — CBC
HCT: 32 % — ABNORMAL LOW (ref 36.0–46.0)
Hemoglobin: 9.4 g/dL — ABNORMAL LOW (ref 12.0–15.0)
MCH: 30.4 pg (ref 26.0–34.0)
MCHC: 29.4 g/dL — ABNORMAL LOW (ref 30.0–36.0)
MCV: 103.6 fL — ABNORMAL HIGH (ref 80.0–100.0)
Platelets: 127 10*3/uL — ABNORMAL LOW (ref 150–400)
RBC: 3.09 MIL/uL — ABNORMAL LOW (ref 3.87–5.11)
RDW: 17.3 % — ABNORMAL HIGH (ref 11.5–15.5)
WBC: 12.1 10*3/uL — ABNORMAL HIGH (ref 4.0–10.5)
nRBC: 0 % (ref 0.0–0.2)

## 2019-10-10 MED ORDER — HEPARIN SODIUM (PORCINE) 1000 UNIT/ML IJ SOLN
INTRAMUSCULAR | Status: AC
  Filled 2019-10-10: qty 4

## 2019-10-10 NOTE — Progress Notes (Signed)
Subjective:  No cos, no changes in pleasant confusion. Noted family plans to be here from Out of town Saturday and dc home with Granddaughter- HD this AM - min  volume removal    Objective Vital signs in last 24 hours: Vitals:   10/10/19 0759 10/10/19 0800 10/10/19 0830 10/10/19 0900  BP: 100/63 95/64 (!) 89/58 (!) 96/59  Pulse: 96 98 99 97  Resp:  18    Temp:      TempSrc:      SpO2:      Weight:       Weight change: 1.8 kg  Physical Exam: General=alert,elderly female pleasantlydisoriented  No significant  Change  ,NAD Chest=CTA  Card=RRR no MRG Abd=soft + BS ,ntnd LLQ PD cath Ext=no LEedema/ R IJ Perm cath   Home meds: - allopurinol 300/ pantoprazole 40 bid - rosuvastatin 5 qd/ Kdur 20 qd - prn's/ vitamins/ supplements  OutptPD: 5 exchanges, 2500 dwell, 2 hr each, no day bag, edw 48.5kg - started in 2017, last clearance here was 2.05 on 08/2019. Now transitioning to HD   Problem/Plan 1.AMS/ confusion/ Acute CVA  On MRI  - . Neuro  Seeing now ,CVA  Workup in progress  - multiple punctate appear to be embolic infarcts - neuro has signed off  2. ESRD - Was on PD x 3 yrs. / not able to preform now with CVA  transition to HD trial for now-  Need to continue HD as albumin it too low to go with PD.has spot at [facility]  as transient locally until moves with son to [place].  Will need to eventually get PD cath out - since is Friday probably not able to get it out as inpatient-  Can see if Vevelyn Royals can take it out before she goes to [place]-  Will be a day procedure  3. Volume/BP- no vol excess on exam, under dry wt/ bp stable without meds- Does not need much UF-  no BP meds  4. Anemia ckd -HGB 9.1<9.9Aranesp 11/16 hd 40 mcg q wk hd op iron studies =09/23/19 22% Ferritin 1155haveStartedvenofer x 3  5. Secondary Hyperparathyroidism -phos 3.2 Ca Corec 11.5 on no meds/ use low ca bath 6. DMT2 - per admit  7. Recent C Diff - diarrhea has  improved 8. Hypoalbuminemia- sec Combination PD , decreased appetite with uremia /Nepro started 11/16, held neprofor diarrhea ho  9. Diarrhea recent c diff admit /  no diarrhea  This am per RN  10. Dispo-  Possible discharge over the weekend with family- has spot at [facility] to start on Monday   Corliss Parish, MD 10/10/2019     Labs: Basic Metabolic Panel: Recent Labs  Lab 10/06/19 0804 10/08/19 0453 10/08/19 1307  NA 140 143 142  K 4.0 4.3 4.3  CL 102 102 103  CO2 26 26 26   GLUCOSE 120* 117* 132*  BUN 60* 50* 59*  CREATININE 8.07* 6.36* 6.85*  CALCIUM 9.9 9.6 9.6  PHOS 4.5 2.9 3.2   Liver Function Tests: Recent Labs  Lab 10/06/19 0804 10/08/19 0453 10/08/19 1307  ALBUMIN 1.6* 1.5* 1.6*   No results for input(s): LIPASE, AMYLASE in the last 168 hours. No results for input(s): AMMONIA in the last 168 hours. CBC: Recent Labs  Lab 10/06/19 0803 10/08/19 0453 10/08/19 1307  WBC 11.2* 12.8* 13.3*  HGB 9.9* 9.0* 9.1*  HCT 35.0* 31.5* 32.2*  MCV 104.5* 103.3* 104.5*  PLT 242 183 193   Cardiac Enzymes: No results for input(s):  CKTOTAL, CKMB, CKMBINDEX, TROPONINI in the last 168 hours. CBG: Recent Labs  Lab 10/09/19 0847 10/09/19 1134 10/09/19 1647 10/09/19 2117 10/10/19 0651  GLUCAP 124* 135* 101* 119* 122*    Studies/Results: Vas US Carotid  Result Date: 10/09/2019 Carotid Arterial Duplex Study Indications:       CVA. Risk Factors:      None. Limitations        Today's exam was limited due to a central line and Bandages. Comparison Study:  No prior studies. Performing Technologist: Oliver Hum RVT  Examination Guidelines: A complete evaluation includes B-mode imaging, spectral Doppler, color Doppler, and power Doppler as needed of all accessible portions of each vessel. Bilateral testing is considered an integral part of a complete examination. Limited examinations for reoccurring indications may be performed as noted.  Right Carotid Findings:  +----------+--------+--------+--------+--------------------------+--------+           PSV cm/sEDV cm/sStenosisPlaque Description        Comments +----------+--------+--------+--------+--------------------------+--------+ CCA Prox  83      13              smooth and heterogenous            +----------+--------+--------+--------+--------------------------+--------+ CCA Distal55      11              smooth and heterogenous            +----------+--------+--------+--------+--------------------------+--------+ ICA Prox  47      13              irregular and heterogenoustortuous +----------+--------+--------+--------+--------------------------+--------+ ICA Distal71      25                                        tortuous +----------+--------+--------+--------+--------------------------+--------+ ECA       84      0                                                  +----------+--------+--------+--------+--------------------------+--------+ +----------+--------+-------+------------+-------------------+           PSV cm/sEDV cmsDescribe    Arm Pressure (mmHG) +----------+--------+-------+------------+-------------------+ Subclavian               Not assessed                    +----------+--------+-------+------------+-------------------+ +---------+--------+--+--------+--+---------+ VertebralPSV cm/s77EDV cm/s17Antegrade +---------+--------+--+--------+--+---------+  Left Carotid Findings: +----------+--------+--------+--------+-----------------------+--------+           PSV cm/sEDV cm/sStenosisPlaque Description     Comments +----------+--------+--------+--------+-----------------------+--------+ CCA Prox  67      13              smooth and heterogenous         +----------+--------+--------+--------+-----------------------+--------+ CCA Distal61      16              smooth and heterogenous          +----------+--------+--------+--------+-----------------------+--------+ ICA Prox  71      20              smooth and heterogenoustortuous +----------+--------+--------+--------+-----------------------+--------+ ICA Distal86      27  tortuous +----------+--------+--------+--------+-----------------------+--------+ ECA       72      4                                               +----------+--------+--------+--------+-----------------------+--------+ +----------+--------+--------+--------+-------------------+           PSV cm/sEDV cm/sDescribeArm Pressure (mmHG) +----------+--------+--------+--------+-------------------+ [id number]                                         +----------+--------+--------+--------+-------------------+ +---------+--------+---+--------+--+---------+ VertebralPSV cm/s116EDV cm/s32Antegrade +---------+--------+---+--------+--+---------+  Summary: Right Carotid: Velocities in the right ICA are consistent with a 1-39% stenosis. Left Carotid: Velocities in the left ICA are consistent with a 1-39% stenosis. Vertebrals: Bilateral vertebral arteries demonstrate antegrade flow. *See table(s) above for measurements and observations.  Electronically signed by Antony Contras MD on 10/09/2019 at 6:08:11 PM.    Final    Vas [place] Lower Extremity Venous (dvt)  Result Date: 10/09/2019  Lower Venous Study Indications: Stroke.  Limitations: Body habitus and poor ultrasound/tissue interface. Comparison Study: No prior study Performing Technologist: Maudry Mayhew MHA, RDMS, RVT, RDCS  Examination Guidelines: A complete evaluation includes B-mode imaging, spectral Doppler, color Doppler, and power Doppler as needed of all accessible portions of each vessel. Bilateral testing is considered an integral part of a complete examination. Limited examinations for reoccurring indications may be performed as noted.   +---------+---------------+---------+-----------+----------+--------------+ RIGHT    CompressibilityPhasicitySpontaneityPropertiesThrombus Aging +---------+---------------+---------+-----------+----------+--------------+ CFV      Full           Yes      Yes                                 +---------+---------------+---------+-----------+----------+--------------+ SFJ      Full                                                        +---------+---------------+---------+-----------+----------+--------------+ FV Prox  Full                                                        +---------+---------------+---------+-----------+----------+--------------+ FV Mid   Full                                                        +---------+---------------+---------+-----------+----------+--------------+ FV DistalFull                                                        +---------+---------------+---------+-----------+----------+--------------+ PFV      Full                                                        +---------+---------------+---------+-----------+----------+--------------+  POP      Full           Yes      Yes                                 +---------+---------------+---------+-----------+----------+--------------+ PTV      Full                                                        +---------+---------------+---------+-----------+----------+--------------+ PERO     Full                                                        +---------+---------------+---------+-----------+----------+--------------+  +---------+---------------+---------+-----------+----------+--------------+ LEFT     CompressibilityPhasicitySpontaneityPropertiesThrombus Aging +---------+---------------+---------+-----------+----------+--------------+ CFV      Full           Yes      Yes                                  +---------+---------------+---------+-----------+----------+--------------+ SFJ      Full                                                        +---------+---------------+---------+-----------+----------+--------------+ FV Prox  Full                                                        +---------+---------------+---------+-----------+----------+--------------+ FV Mid   Full                                                        +---------+---------------+---------+-----------+----------+--------------+ FV DistalFull                                                        +---------+---------------+---------+-----------+----------+--------------+ PFV      Full                                                        +---------+---------------+---------+-----------+----------+--------------+ POP      Full           Yes      Yes                                 +---------+---------------+---------+-----------+----------+--------------+  PTV      Full                                                        +---------+---------------+---------+-----------+----------+--------------+ PERO     Full                                                        +---------+---------------+---------+-----------+----------+--------------+  Summary: Right: There is no evidence of deep vein thrombosis in the lower extremity. However, portions of this examination were limited- see technologist comments above. No cystic structure found in the popliteal fossa. Left: There is no evidence of deep vein thrombosis in the lower extremity. However, portions of this examination were limited- see technologist comments above. No cystic structure found in the popliteal fossa.  Unable to adequately visualize bilateral calf veins. *See table(s) above for measurements and observations. Electronically signed by Monica Martinez MD on 10/09/2019 at 7:16:08 PM.    Final    Medications: . dialysis  solution 1.5% low-MG/low-CA     . allopurinol  300 mg Oral Daily  . aspirin EC  81 mg Oral QHS  . Chlorhexidine Gluconate Cloth  6 each Topical Daily  . Chlorhexidine Gluconate Cloth  6 each Topical Q0600  . clopidogrel  75 mg Oral Daily  . darbepoetin (ARANESP) injection - DIALYSIS  40 mcg Intravenous Q Mon-HD  . feeding supplement (NEPRO CARB STEADY)  237 mL Oral TID BM  . feeding supplement (PRO-STAT SUGAR FREE 64)  30 mL Oral BID  . insulin aspart  0-9 Units Subcutaneous TID WC  . lidocaine  15 mL Mouth/Throat TID AC  . multivitamin  1 tablet Oral QHS  . omega-3 acid ethyl esters  1 g Oral Daily  . pantoprazole  40 mg Oral BID

## 2019-10-10 NOTE — Progress Notes (Signed)
[facility] PROGRESS NOTE  Eileen Perez XVQ:[id number] DOB: 11-Oct-1941 DOA: 09/30/2019 PCP: Unk Pinto, MD   Subjective  Seen earlier today while she was getting dialysis. Evaluated by EP cardiology for ILR, recommended 30-day monitor. Neurology recommended DAPT  Brief history  Eileen Perez the patient is a78 y.o.year-old who lives alone with ESRD on peritoneal dialysis, gastric carcinoid, gout, DM2   presenting to ED with confusion. She lives alone. Her grand daugther noted confusion over the weekend and her [facility] went to check on her on Tuesday and noted severe confusion. Her daughter Eileen Perez does not live with her tells me that a dialysis RN or a family member were helping with her dialysis. She was also eating poorly this past week.   In ED> head CT unrevealing, Ammonia level negative  Assessment and plan   Principal Problem:   Acute encephalopathy Active Problems:   Essential hypertension   Anemia of chronic Renal Dz   ESRD (end stage renal disease) (HCC)   Carcinoid tumor of stomach   Goals of care, counseling/discussion   Cerebral embolism with cerebral infarction   Palliative care by specialist    Acute CVA Patient was presented with acute confusional state. MRI showed multiple small areas of acute infarction bilaterally. Neurology consulted, stroke work-up is started. Aspirin increased to full dose, follow telemetry Telemetry shows no A. fib, 2D echo without intracardiac thrombus. Cardiology recommended event monitor. Currently she is on aspirin and Plavix, continued along with statins.  Acute metabolic encephalopathy This is likely secondary to acute CVA B12, folate and TSH WNL.    ESRD (end stage renal disease) on peritoneal dialysis since 2017  SHPT - appreciate nephrology assisting with management- HD started 11/13- initially thought that she would continue to need HD - palliative care consulted by nephrology to help make further  decisions Family willing to take her home when help with peritoneal dialysis for her.  Diarrhea - Recent C diff colitis - abdomen non-tender, no leukocytosis of fevers thus likely not infectious - d/c'd Nepro- diarrhea has improved   Anemia of chronic disease - stable  Gout - cont allopurinol  Weight loss - per daughter she lost about 13 lb but has recently gained some back  Severe protein calorie malnutrition She weighs 46 kg, albumin is in the low side. Dietitian consulted for further evaluation.  DM2 - last A1c 6.5 in 4/20- not on medications at home so I suspect it is diet controlled- sugars have been normal and thus d/c'd  fingerstick's.  HLD Crestor  Carcinoid tumor of stomach  Code Status: Full Code Family Communication: Daughter wants her back home Disposition Plan: Remains inpatient Diet:  Diet Order            Diet renal with fluid restriction Fluid restriction: 1200 mL Fluid; Room service appropriate? Yes; Fluid consistency: Thin  Diet effective now              Consultants   Neuro.  Nephrology  Procedures  . None  Antibiotics   None   Objective   Vitals:   10/10/19 1030 10/10/19 1100  BP: (!) 86/56 106/62  Pulse: 98 96  Resp:    Temp:  (!) 97.5 F (36.4 C)  SpO2:  100%    Intake/Output Summary (Last 24 hours) at 10/10/2019 1153 Last data filed at 10/10/2019 1100 Gross per 24 hour  Intake 120 ml  Output 510 ml  Net -390 ml   Filed Weights   10/08/19 2109 10/10/19 0700 10/10/19  1100  Weight: 46.9 kg 48.7 kg 47.1 kg    Physical examination  General: Alert and awake, oriented x3, not in any acute distress. HEENT: anicteric sclera, pupils reactive to light and accommodation, EOMI CVS: S1-S2 clear, no murmur rubs or gallops Chest: clear to auscultation bilaterally, no wheezing, rales or rhonchi Abdomen: soft nontender, nondistended, normal bowel sounds, no organomegaly Extremities: no cyanosis, clubbing or edema noted  bilaterally Neuro: Cranial nerves II-XII intact, no focal neurological deficits  Reviewed data  Basic Metabolic Panel: Recent Labs  Lab 10/05/19 0741 10/06/19 0804 10/08/19 0453 10/08/19 1307  NA 138 140 143 142  K 4.0 4.0 4.3 4.3  CL 100 102 102 103  CO2 26 26 26 26   GLUCOSE 119* 120* 117* 132*  BUN 34* 60* 50* 59*  CREATININE 6.20* 8.07* 6.36* 6.85*  CALCIUM 10.3 9.9 9.6 9.6  PHOS  --  4.5 2.9 3.2   Liver Function Tests: Recent Labs  Lab 10/06/19 0804 10/08/19 0453 10/08/19 1307  ALBUMIN 1.6* 1.5* 1.6*   No results for input(s): LIPASE, AMYLASE in the last 168 hours. No results for input(s): AMMONIA in the last 168 hours. CBC: Recent Labs  Lab 10/06/19 0803 10/08/19 0453 10/08/19 1307  WBC 11.2* 12.8* 13.3*  HGB 9.9* 9.0* 9.1*  HCT 35.0* 31.5* 32.2*  MCV 104.5* 103.3* 104.5*  PLT 242 183 193   Cardiac Enzymes: No results for input(s): CKTOTAL, CKMB, CKMBINDEX, TROPONINI in the last 168 hours. BNP (last 3 results) Recent Labs    08/28/19 1930  BNP 90.6    ProBNP (last 3 results) No results for input(s): PROBNP in the last 8760 hours.  CBG: Recent Labs  Lab 10/09/19 1134 10/09/19 1647 10/09/19 2117 10/10/19 0651 10/10/19 1146  GLUCAP 135* 101* 119* 122* 105*    Micro Recent Results (from the past 240 hour(s))  SARS CORONAVIRUS 2 (TAT 6-24 HRS) Nasopharyngeal Nasopharyngeal Swab     Status: None   Collection Time: 09/30/19  7:10 PM   Specimen: Nasopharyngeal Swab  Result Value Ref Range Status   SARS Coronavirus 2 NEGATIVE NEGATIVE Final    Comment: (NOTE) SARS-CoV-2 target nucleic acids are NOT DETECTED. The SARS-CoV-2 RNA is generally detectable in upper and lower respiratory specimens during the acute phase of infection. Negative results do not preclude SARS-CoV-2 infection, do not rule out co-infections with other pathogens, and should not be used as the sole basis for treatment or other patient management decisions. Negative results  must be combined with clinical observations, patient history, and epidemiological information. The expected result is Negative. Fact Sheet for Patients: SugarRoll.be Fact Sheet for Healthcare Providers: https://www.woods-mathews.com/ This test is not yet approved or cleared by the [place] FDA and  has been authorized for detection and/or diagnosis of SARS-CoV-2 by FDA under an Emergency Use Authorization (EUA). This EUA will remain  in effect (meaning this test can be used) for the duration of the COVID-19 declaration under Section 56 4(b)(1) of the Act, 21 U.S.C. section 360bbb-3(b)(1), unless the authorization is terminated or revoked sooner. Performed at [facility], [place] [address] [place], [place] [address]   Surgical pcr screen     Status: None   Collection Time: 10/01/19  1:44 AM   Specimen: Nasal Mucosa; Nasal Swab  Result Value Ref Range Status   MRSA, PCR NEGATIVE NEGATIVE Final   Staphylococcus aureus NEGATIVE NEGATIVE Final    Comment: (NOTE) The Xpert SA Assay (FDA approved for NASAL specimens in patients 22 years of  age and older), is one component of a comprehensive surveillance program. It is not intended to diagnose infection nor to guide or monitor treatment. Performed at [facility], [place] [address], [place], [place] [address]      Radiological studies, reviewed  Vas [place] Lower Extremity Venous (dvt)  Result Date: 10/09/2019  Lower Venous Study Indications: Stroke.  Limitations: Body habitus and poor ultrasound/tissue interface. Comparison Study: No prior study Performing Technologist: Maudry Mayhew MHA, RDMS, RVT, RDCS  Examination Guidelines: A complete evaluation includes B-mode imaging, spectral Doppler, color Doppler, and power Doppler as needed of all accessible portions of each vessel. Bilateral testing is considered an integral part of a complete examination. Limited examinations for  reoccurring indications may be performed as noted.  +---------+---------------+---------+-----------+----------+--------------+ RIGHT    CompressibilityPhasicitySpontaneityPropertiesThrombus Aging +---------+---------------+---------+-----------+----------+--------------+ CFV      Full           Yes      Yes                                 +---------+---------------+---------+-----------+----------+--------------+ SFJ      Full                                                        +---------+---------------+---------+-----------+----------+--------------+ FV Prox  Full                                                        +---------+---------------+---------+-----------+----------+--------------+ FV Mid   Full                                                        +---------+---------------+---------+-----------+----------+--------------+ FV DistalFull                                                        +---------+---------------+---------+-----------+----------+--------------+ PFV      Full                                                        +---------+---------------+---------+-----------+----------+--------------+ POP      Full           Yes      Yes                                 +---------+---------------+---------+-----------+----------+--------------+ PTV      Full                                                        +---------+---------------+---------+-----------+----------+--------------+  PERO     Full                                                        +---------+---------------+---------+-----------+----------+--------------+  +---------+---------------+---------+-----------+----------+--------------+ LEFT     CompressibilityPhasicitySpontaneityPropertiesThrombus Aging +---------+---------------+---------+-----------+----------+--------------+ CFV      Full           Yes      Yes                                  +---------+---------------+---------+-----------+----------+--------------+ SFJ      Full                                                        +---------+---------------+---------+-----------+----------+--------------+ FV Prox  Full                                                        +---------+---------------+---------+-----------+----------+--------------+ FV Mid   Full                                                        +---------+---------------+---------+-----------+----------+--------------+ FV DistalFull                                                        +---------+---------------+---------+-----------+----------+--------------+ PFV      Full                                                        +---------+---------------+---------+-----------+----------+--------------+ POP      Full           Yes      Yes                                 +---------+---------------+---------+-----------+----------+--------------+ PTV      Full                                                        +---------+---------------+---------+-----------+----------+--------------+ PERO     Full                                                        +---------+---------------+---------+-----------+----------+--------------+  Summary: Right: There is no evidence of deep vein thrombosis in the lower extremity. However, portions of this examination were limited- see technologist comments above. No cystic structure found in the popliteal fossa. Left: There is no evidence of deep vein thrombosis in the lower extremity. However, portions of this examination were limited- see technologist comments above. No cystic structure found in the popliteal fossa.  Unable to adequately visualize bilateral calf veins. *See table(s) above for measurements and observations. Electronically signed by Monica Martinez MD on 10/09/2019 at 7:16:08 PM.    Final     Scheduled Meds: .  allopurinol  300 mg Oral Daily  . aspirin EC  81 mg Oral QHS  . Chlorhexidine Gluconate Cloth  6 each Topical Daily  . Chlorhexidine Gluconate Cloth  6 each Topical Q0600  . clopidogrel  75 mg Oral Daily  . darbepoetin (ARANESP) injection - DIALYSIS  40 mcg Intravenous Q Mon-HD  . feeding supplement (NEPRO CARB STEADY)  237 mL Oral TID BM  . feeding supplement (PRO-STAT SUGAR FREE 64)  30 mL Oral BID  . heparin      . insulin aspart  0-9 Units Subcutaneous TID WC  . lidocaine  15 mL Mouth/Throat TID AC  . multivitamin  1 tablet Oral QHS  . omega-3 acid ethyl esters  1 g Oral Daily  . pantoprazole  40 mg Oral BID   Continuous Infusions: . dialysis solution 1.5% low-MG/low-CA       Time spent: 35 minutes  [facility] Pager [phone] If 7PM-7AM, please contact night-coverage at [email or web address], password [facility] 10/10/2019, 11:53 AM  LOS: 9 days

## 2019-10-10 NOTE — Progress Notes (Signed)
Patient heart rate dropped to the 20s-30s this am twice unsustained, Pt appearing resting upon assessment, no distress and was  Alert and Oriented X3. Vital signs stable and [place], NP notified via [place]. Will continue to monitor.  Celestia Khat

## 2019-10-10 NOTE — Progress Notes (Signed)
Although patient has been given a MWF schedule, this will change next week for the Thanksgiving holiday. She will need to have inpt HD here on Sunday and will start in the OP HD clinic/SW on Tuesday, 10/14/19, if medically cleared by this time. Renal Navigator spoke with patient's daughter to update her with this plan. Patient's OP HD days next week will be Tuesday 11/24 and Friday 11/27. She will resume normal MWF schedule the following week.  Alphonzo Cruise, [place] Renal Navigator [phone]

## 2019-10-10 NOTE — Procedures (Signed)
Patient was seen on dialysis and the procedure was supervised.  BFR 350  Via TDC BP is  85/58.   Patient appears to be tolerating treatment well  Louis Meckel 10/10/2019

## 2019-10-10 NOTE — Progress Notes (Signed)
Physical Therapy Treatment Patient Details Name: Eileen Perez MRN: [id number] DOB: September 30, 1941 Today's Date: 10/10/2019    History of Present Illness 78 y.o. 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 Plan for dialysis catheter 11/13.    PT Comments    Per chart review, family is refusing SNF and plans to take patient home- follow up therapy and equipment recommendations updated. Patient received in bed asleep, easily woken and willing to participate with PT today. Able to complete bed mobility with S, functional transfers with min guard and RW, and gait approximately 12f with RW and min guard to and from bathroom. She had a large, loose BM in standing and most of session was focused on pericare and cleaning her up from this BM. Able to stand with RW for approximately 5 minutes for cleaning in standing, and she was fatigued following BM and pericare activities. She was left in bed with all needs met and bed alarm active, RN aware of patient status and need for sacral pad change.     Follow Up Recommendations  Home health PT;Supervision/Assistance - 24 hour(family refusing SNF)     Equipment Recommendations  Rolling walker with 5" wheels;3in1 (PT)    Recommendations for Other Services       Precautions / Restrictions Precautions Precautions: Fall;Other (comment) Restrictions Weight Bearing Restrictions: No    Mobility  Bed Mobility Overal bed mobility: Needs Assistance Bed Mobility: Supine to Sit;Sit to Supine     Supine to sit: Supervision Sit to supine: Supervision   General bed mobility comments: S with no physical assist given, increased time and effort  Transfers Overall transfer level: Needs  assistance Equipment used: Rolling walker (2 wheeled) Transfers: Sit to/from Stand Sit to Stand: Min guard         General transfer comment: Min guard, cues for hand placement  Ambulation/Gait Ambulation/Gait assistance: Min guard Gait Distance (Feet): 15 Feet(x2) Assistive device: Rolling walker (2 wheeled) Gait Pattern/deviations: Step-through pattern;Decreased stride length Gait velocity: decreased   General Gait Details: gait limited due to having massive loose BM in standing and BM getting on socks, also fatigued after standing to be cleaned   SChief Strategy Officer   Modified Rankin (Stroke Patients Only)       Balance Overall balance assessment: Needs assistance Sitting-balance support: Feet supported Sitting balance-Leahy Scale: Good     Standing balance support: During functional activity;Bilateral upper extremity supported Standing balance-Leahy Scale: Poor Standing balance comment: UE support for balance using rolling walker                            Cognition Arousal/Alertness: Awake/alert Behavior During Therapy: Flat affect Overall Cognitive Status: Impaired/Different from baseline Area of Impairment: Awareness                 Orientation Level: Disoriented to;Place;Time;Situation Current Attention Level: Sustained Memory: Decreased short-term memory Following Commands: Follows one step commands consistently Safety/Judgement: Decreased awareness of safety;Decreased awareness of deficits Awareness: Intellectual Problem Solving: Slow processing;Requires verbal cues General Comments: remains flat, able to follow cues well      Exercises      General Comments General comments (skin integrity, edema, etc.): massive loose BM in  standing, most of time spent was on pericare and standing tolerance/balance      Pertinent Vitals/Pain Pain Assessment: No/denies pain Faces Pain Scale: No hurt Pain  Intervention(s): Limited activity within patient's tolerance;Monitored during session    Home Living                      Prior Function            PT Goals (current goals can now be found in the care plan section) Acute Rehab PT Goals Patient Stated Goal: to brush my teeth PT Goal Formulation: With patient/family Time For Goal Achievement: 10/14/19 Potential to Achieve Goals: Good Progress towards PT goals: Progressing toward goals    Frequency    Min 2X/week      PT Plan Discharge plan needs to be updated;Equipment recommendations need to be updated    Co-evaluation              AM-PAC PT "6 Clicks" Mobility   Outcome Measure  Help needed turning from your back to your side while in a flat bed without using bedrails?: None Help needed moving from lying on your back to sitting on the side of a flat bed without using bedrails?: None Help needed moving to and from a bed to a chair (including a wheelchair)?: A Little Help needed standing up from a chair using your arms (e.g., wheelchair or bedside chair)?: A Little Help needed to walk in hospital room?: A Little Help needed climbing 3-5 steps with a railing? : A Little 6 Click Score: 20    End of Session   Activity Tolerance: Patient tolerated treatment well Patient left: in bed;with call bell/phone within reach;with bed alarm set Nurse Communication: Mobility status;Other (comment)(stool under bandage, needs changing) PT Visit Diagnosis: Muscle weakness (generalized) (M62.81);Other symptoms and signs involving the nervous system (R29.898)     Time: 2992-4268 PT Time Calculation (min) (ACUTE ONLY): 38 min  Charges:  $Gait Training: 8-22 mins $Therapeutic Activity: 23-37 mins                     Windell Norfolk, DPT, PN1   Supplemental Physical Therapist [place]    Pager [phone] Acute Rehab Office [phone]

## 2019-10-11 DIAGNOSIS — I6349 Cerebral infarction due to embolism of other cerebral artery: Secondary | ICD-10-CM

## 2019-10-11 LAB — CBC
HCT: 29.9 % — ABNORMAL LOW (ref 36.0–46.0)
Hemoglobin: 8.6 g/dL — ABNORMAL LOW (ref 12.0–15.0)
MCH: 30.5 pg (ref 26.0–34.0)
MCHC: 28.8 g/dL — ABNORMAL LOW (ref 30.0–36.0)
MCV: 106 fL — ABNORMAL HIGH (ref 80.0–100.0)
Platelets: 124 10*3/uL — ABNORMAL LOW (ref 150–400)
RBC: 2.82 MIL/uL — ABNORMAL LOW (ref 3.87–5.11)
RDW: 17.4 % — ABNORMAL HIGH (ref 11.5–15.5)
WBC: 10.1 10*3/uL (ref 4.0–10.5)
nRBC: 0 % (ref 0.0–0.2)

## 2019-10-11 LAB — GLUCOSE, CAPILLARY
Glucose-Capillary: 172 mg/dL — ABNORMAL HIGH (ref 70–99)
Glucose-Capillary: 129 mg/dL — ABNORMAL HIGH (ref 70–99)
Glucose-Capillary: 137 mg/dL — ABNORMAL HIGH (ref 70–99)
Glucose-Capillary: 141 mg/dL — ABNORMAL HIGH (ref 70–99)

## 2019-10-11 LAB — BASIC METABOLIC PANEL
Anion gap: 12 (ref 5–15)
BUN: 43 mg/dL — ABNORMAL HIGH (ref 8–23)
CO2: 25 mmol/L (ref 22–32)
Calcium: 8.7 mg/dL — ABNORMAL LOW (ref 8.9–10.3)
Chloride: 104 mmol/L (ref 98–111)
Creatinine, Ser: 4.31 mg/dL — ABNORMAL HIGH (ref 0.44–1.00)
GFR calc Af Amer: 11 mL/min — ABNORMAL LOW (ref >60)
GFR calc non Af Amer: 9 mL/min — ABNORMAL LOW (ref >60)
Glucose, Bld: 203 mg/dL — ABNORMAL HIGH (ref 70–99)
Potassium: 3.4 mmol/L — ABNORMAL LOW (ref 3.5–5.1)
Sodium: 141 mmol/L (ref 135–145)

## 2019-10-11 MED ORDER — ROSUVASTATIN CALCIUM 5 MG PO TABS
5.0000 mg | ORAL_TABLET | Freq: Every day | ORAL | Status: DC
  Administered 2019-10-11: 5 mg via ORAL
  Filled 2019-10-11: qty 1

## 2019-10-11 MED ORDER — ROSUVASTATIN CALCIUM 5 MG PO TABS: 10.0000 mg | ORAL_TABLET | Freq: Every day | ORAL | Status: DC

## 2019-10-11 MED ORDER — CHLORHEXIDINE GLUCONATE CLOTH 2 % EX PADS
6.0000 | MEDICATED_PAD | Freq: Every day | CUTANEOUS | Status: DC
  Administered 2019-10-12: 6 via TOPICAL

## 2019-10-11 NOTE — Progress Notes (Addendum)
[facility] Progress Note   Subjective:  Seen in room - pleasantly confused. Denies CP/dyspnea/abd pain. S/p HD yesterday with 52mL net UF.  Objective Vitals:   10/10/19 2109 10/10/19 2127 10/11/19 0500 10/11/19 0600  BP: (!) 96/56 (!) 101/59  98/68  Pulse: 93 91  60  Resp: 18   18  Temp: 98.2 F (36.8 C)   98.4 F (36.9 C)  TempSrc: Oral   Oral  SpO2: 98%   98%  Weight:   47.1 kg    Physical Exam General: Frail, elderly woman. Confused, but calm. Heart: RRR; no murmur Lungs: CTA anteriorly Abdomen: soft, non-tender. PD cath in LLQ Extremities: No LE edema Dialysis Access: TDC in R chest - no tenderness  Additional Objective Labs: Basic Metabolic Panel: Recent Labs  Lab 10/08/19 0453 10/08/19 1307 10/10/19 1245 10/11/19 0833  NA 143 142 140 141  K 4.3 4.3 3.5 3.4*  CL 102 103 101 104  CO2 26 26 25 25   GLUCOSE 117* 132* 102* 203*  BUN 50* 59* 14 43*  CREATININE 6.36* 6.85* 2.18* 4.31*  CALCIUM 9.6 9.6 7.9* 8.7*  PHOS 2.9 3.2 1.5*  --    Liver Function Tests: Recent Labs  Lab 10/08/19 0453 10/08/19 1307 10/10/19 1245  ALBUMIN 1.5* 1.6* 1.6*   CBC: Recent Labs  Lab 10/06/19 0803 10/08/19 0453 10/08/19 1307 10/10/19 1245 10/11/19 0833  WBC 11.2* 12.8* 13.3* 12.1* 10.1  HGB 9.9* 9.0* 9.1* 9.4* 8.6*  HCT 35.0* 31.5* 32.2* 32.0* 29.9*  MCV 104.5* 103.3* 104.5* 103.6* 106.0*  PLT 242 183 193 127* 124*   CBG: Recent Labs  Lab 10/10/19 0651 10/10/19 1146 10/10/19 1631 10/10/19 2213 10/11/19 0625  GLUCAP 122* 105* 132* 114* 172*   Studies/Results: Vas [place] Lower Extremity Venous (dvt)  Result Date: 10/09/2019  Lower Venous Study Indications: Stroke.  Limitations: Body habitus and poor ultrasound/tissue interface. Comparison Study: No prior study Performing Technologist: Maudry Mayhew MHA, RDMS, RVT, RDCS  Examination Guidelines: A complete evaluation includes B-mode imaging, spectral Doppler, color Doppler, and power Doppler  as needed of all accessible portions of each vessel. Bilateral testing is considered an integral part of a complete examination. Limited examinations for reoccurring indications may be performed as noted.  +---------+---------------+---------+-----------+----------+--------------+ RIGHT    CompressibilityPhasicitySpontaneityPropertiesThrombus Aging +---------+---------------+---------+-----------+----------+--------------+ CFV      Full           Yes      Yes                                 +---------+---------------+---------+-----------+----------+--------------+ SFJ      Full                                                        +---------+---------------+---------+-----------+----------+--------------+ FV Prox  Full                                                        +---------+---------------+---------+-----------+----------+--------------+ FV Mid   Full                                                        +---------+---------------+---------+-----------+----------+--------------+  FV DistalFull                                                        +---------+---------------+---------+-----------+----------+--------------+ PFV      Full                                                        +---------+---------------+---------+-----------+----------+--------------+ POP      Full           Yes      Yes                                 +---------+---------------+---------+-----------+----------+--------------+ PTV      Full                                                        +---------+---------------+---------+-----------+----------+--------------+ PERO     Full                                                        +---------+---------------+---------+-----------+----------+--------------+  +---------+---------------+---------+-----------+----------+--------------+ LEFT     CompressibilityPhasicitySpontaneityPropertiesThrombus  Aging +---------+---------------+---------+-----------+----------+--------------+ CFV      Full           Yes      Yes                                 +---------+---------------+---------+-----------+----------+--------------+ SFJ      Full                                                        +---------+---------------+---------+-----------+----------+--------------+ FV Prox  Full                                                        +---------+---------------+---------+-----------+----------+--------------+ FV Mid   Full                                                        +---------+---------------+---------+-----------+----------+--------------+ FV DistalFull                                                        +---------+---------------+---------+-----------+----------+--------------+  PFV      Full                                                        +---------+---------------+---------+-----------+----------+--------------+ POP      Full           Yes      Yes                                 +---------+---------------+---------+-----------+----------+--------------+ PTV      Full                                                        +---------+---------------+---------+-----------+----------+--------------+ PERO     Full                                                        +---------+---------------+---------+-----------+----------+--------------+  Summary: Right: There is no evidence of deep vein thrombosis in the lower extremity. However, portions of this examination were limited- see technologist comments above. No cystic structure found in the popliteal fossa. Left: There is no evidence of deep vein thrombosis in the lower extremity. However, portions of this examination were limited- see technologist comments above. No cystic structure found in the popliteal fossa.  Unable to adequately visualize bilateral calf veins. *See table(s)  above for measurements and observations. Electronically signed by Monica Martinez MD on 10/09/2019 at 7:16:08 PM.    Final    Medications: . dialysis solution 1.5% low-MG/low-CA     . allopurinol  300 mg Oral Daily  . aspirin EC  81 mg Oral QHS  . Chlorhexidine Gluconate Cloth  6 each Topical Daily  . Chlorhexidine Gluconate Cloth  6 each Topical Q0600  . clopidogrel  75 mg Oral Daily  . darbepoetin (ARANESP) injection - DIALYSIS  40 mcg Intravenous Q Mon-HD  . feeding supplement (NEPRO CARB STEADY)  237 mL Oral TID BM  . feeding supplement (PRO-STAT SUGAR FREE 64)  30 mL Oral BID  . insulin aspart  0-9 Units Subcutaneous TID WC  . lidocaine  15 mL Mouth/Throat TID AC  . multivitamin  1 tablet Oral QHS  . omega-3 acid ethyl esters  1 g Oral Daily  . pantoprazole  40 mg Oral BID    Dialysis Orders: Prev on PD -> now transitioning to HD  Assessment/Plan: 1.Acute CVA: Per MRI. 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 Will be on DAPT. 2. AMS: Presumed d/t #1, ongoing/chronic. 3. ESRD: Prev on PD x 3 years. Now with ongoing encephalopathy will be unable to perform PD, so transitioned to iHD. Has spot at [facility] as transient locally until moves with son to [place].  Will need to eventually get PD cath out - will plan on this as outpatient prior to her moving to [place]. Will be MWF sched - BUT, holiday sched for next week so her days will  be Sun, Wed, Sat - HD tomorrow. 4. Volume/BP: No edema, minimal gains. Low UF goals. 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 Hgb 8.6 - continue Aranesp weekly. 6. Secondary HPTH: Corr Ca slightly high, Phos low - no binders or VDRA. Low Ca bath. 7. T2DM: Per primary 8. Recent C.diff: ?resolved 9. Nutrition: Alb very low (in setting of PD and low appetite): Continue Nepro + pro-stat 10. Dispo: Plan is HD inpatient tomorrow [facility]), then d/c after - can start HD on Tues (holiday schedule)   Veneta Penton, PA-C 10/11/2019, 9:22 AM  [facility] Pager: [phone]  Patient seen and examined, agree with above note with above modifications. No major change.  Plan is for HD tomorrow here, then discharge home with granddaughter-  Plan to start at [facility] on Tuesday and cont with HD there until can be relocated to HD unit near son in [place]  Jacilyn Sanpedro, MD 10/11/2019

## 2019-10-11 NOTE — Progress Notes (Signed)
Nutrition Education Brief Note  RD working remotely.   RD consulted for diet education regarding ESRD, new hemodialysis patient. RD attempted to reach patient via phone, unfortunately patient did not answer upon 2 attempts today. Per chart review patient is reported to be pleasantly confused and would likely be inappropriate to provide patient with education at this time. Nutrition counseling and patient support services provided at [facility].  No nutrition education warranted at this time. If patient or family has questions prior to discharge please re-consult RD.   Eileen Perez, RD, LDN Clinical Nutrition [facility] Telephone [phone] After Hours/Weekend Pager: [phone]

## 2019-10-11 NOTE — Progress Notes (Signed)
PROGRESS NOTE    Eileen Perez  CHY:[id number] DOB: 29-Apr-1941 DOA: 09/30/2019 PCP: Unk Pinto, MD     Brief Narrative:  Eileen Perez is a 78 y.o.year-old who lives alone with ESRD on peritoneal dialysis, gastric carcinoid, gout, DM2presenting to ED with confusion. She lives alone. Her granddaugther noted confusion over the weekend and her [facility] went to check on her on Tuesday and noted severe confusion. She was also eating poorly this past week. Work up revealed acute CVA.  She was transitioned from peritoneal dialysis to hemodialysis during hospitalization.  New events last 24 hours / Subjective: Pleasantly confused, does not recall undergoing dialysis yesterday.  Assessment & Plan:   Principal Problem:   Acute encephalopathy Active Problems:   Essential hypertension   Anemia of chronic Renal Dz   ESRD (end stage renal disease) (HCC)   Carcinoid tumor of stomach   Goals of care, counseling/discussion   Cerebral embolism with cerebral infarction   Palliative care by specialist   Acute CVA -MRI brain: Multiple small areas of acute infarction bilaterally in different vascular distributions, suggestive of multiple small emboli. -MRA brain: No proximal intracranial vessel occlusion or significant stenosis. -Echo: EF 60-65%, grade 1 dd  -Carotis [place]: 1-39% stenosis bilaterally  -DVT US: negative bilaterally  -Neurology consulted, stroke work-up completed  -Telemetry shows no A. fib, 2D echo without intracardiac thrombus. -EP recommended 30 day event monitoring  -Currently she is on aspirin and Plavix for 3 weeks, then plavix alone. Continue crestor  -Neurology signed off 11/19; follow up with stroke clinic in 4 weeks or establish with neurology when patient moves to [place]   Acute metabolic encephalopathy -This is likely secondary to acute CVA -B12, folate and TSH WNL.  ESRD (end stage renal disease) on peritoneal dialysis since 2017 -Now on hemodialysis   -Nephrology following   Diarrhea - Recent C diff colitis -Diarrhea improved   Anemia of chronic disease -Stable  Gout -Continue allopurinol  Severe protein calorie malnutrition -Per dietitian   DM2 -Last A1c 6.5 in 4/20 -Well controlled -SSI   HLD -Continue crestor    In agreement with assessment of the pressure ulcer as below:  Pressure Injury 09/10/19 Coccyx Mid Stage II -  Partial thickness loss of dermis presenting as a shallow open ulcer with a red, pink wound bed without slough. (Active)  09/10/19 1515  Location: Coccyx  Location Orientation: Mid  Staging: Stage II -  Partial thickness loss of dermis presenting as a shallow open ulcer with a red, pink wound bed without slough.  Wound Description (Comments):   Present on Admission: Yes      DVT prophylaxis: SCD Code Status: Full code Family Communication: None at bedside Disposition Plan: Plan for hemodialysis holiday schedule tomorrow 11/22.  Plan for home health on discharge   Consultants:   Nephrology  Palliative care  IR  Neurology  EP   Antimicrobials:  Anti-infectives (From admission, onward)   Start     Dose/Rate Route Frequency Ordered Stop   10/03/19 1403  ceFAZolin (ANCEF) 2-4 GM/100ML-% IVPB    Note to Pharmacy: Manuela Neptune   : cabinet override      10/03/19 1403 10/03/19 1407   10/03/19 0000  ceFAZolin (ANCEF) IVPB 2g/100 mL premix     2 g 200 mL/hr over 30 Minutes Intravenous  Once 10/02/19 1703 10/03/19 0249   10/02/19 1530  ceFAZolin (ANCEF) IVPB 2g/100 mL premix  Status:  Discontinued     2 g 200 mL/hr over 30 Minutes  Intravenous  Once 10/02/19 1518 10/02/19 1703   10/01/19 0800  vancomycin (VANCOCIN) 50 mg/mL oral solution 125 mg  Status:  Discontinued     125 mg Oral 4 times daily 10/01/19 0631 10/01/19 1313        Objective: Vitals:   10/10/19 2127 10/11/19 0500 10/11/19 0600 10/11/19 0934  BP: (!) 101/59  98/68 (!) 93/55  Pulse: 91  60 (!) 102  Resp:    18 18  Temp:   98.4 F (36.9 C) 99.6 F (37.6 C)  TempSrc:   Oral Oral  SpO2:   98% 99%  Weight:  47.1 kg      Intake/Output Summary (Last 24 hours) at 10/11/2019 1053 Last data filed at 10/11/2019 0600 Gross per 24 hour  Intake 780 ml  Output 510 ml  Net 270 ml   Filed Weights   10/10/19 0700 10/10/19 1100 10/11/19 0500  Weight: 48.7 kg 47.1 kg 47.1 kg    Examination:  General exam: Appears calm and comfortable  Respiratory system: Clear to auscultation. Respiratory effort normal. No respiratory distress. No conversational dyspnea.  Cardiovascular system: S1 & S2 heard, RRR. No murmurs. No pedal edema. Gastrointestinal system: Abdomen is nondistended, soft and nontender. Normal bowel sounds heard. Central nervous system: Alert   Extremities: Symmetric in appearance  Skin: No rashes, lesions or ulcers on exposed skin  Psychiatry: Mild confusion   Data Reviewed: I have personally reviewed following labs and imaging studies  CBC: Recent Labs  Lab 10/06/19 0803 10/08/19 0453 10/08/19 1307 10/10/19 1245 10/11/19 0833  WBC 11.2* 12.8* 13.3* 12.1* 10.1  HGB 9.9* 9.0* 9.1* 9.4* 8.6*  HCT 35.0* 31.5* 32.2* 32.0* 29.9*  MCV 104.5* 103.3* 104.5* 103.6* 106.0*  PLT 242 183 193 127* 875*   Basic Metabolic Panel: Recent Labs  Lab 10/06/19 0804 10/08/19 0453 10/08/19 1307 10/10/19 1245 10/11/19 0833  NA 140 143 142 140 141  K 4.0 4.3 4.3 3.5 3.4*  CL 102 102 103 101 104  CO2 26 26 26 25 25   GLUCOSE 120* 117* 132* 102* 203*  BUN 60* 50* 59* 14 43*  CREATININE 8.07* 6.36* 6.85* 2.18* 4.31*  CALCIUM 9.9 9.6 9.6 7.9* 8.7*  PHOS 4.5 2.9 3.2 1.5*  --    GFR: Estimated Creatinine Clearance: 7.9 mL/min (A) (by C-G formula based on SCr of 4.31 mg/dL (H)). Liver Function Tests: Recent Labs  Lab 10/06/19 0804 10/08/19 0453 10/08/19 1307 10/10/19 1245  ALBUMIN 1.6* 1.5* 1.6* 1.6*   No results for input(s): LIPASE, AMYLASE in the last 168 hours. No results for  input(s): AMMONIA in the last 168 hours. Coagulation Profile: No results for input(s): INR, PROTIME in the last 168 hours. Cardiac Enzymes: No results for input(s): CKTOTAL, CKMB, CKMBINDEX, TROPONINI in the last 168 hours. BNP (last 3 results) No results for input(s): PROBNP in the last 8760 hours. HbA1C: No results for input(s): HGBA1C in the last 72 hours. CBG: Recent Labs  Lab 10/10/19 0651 10/10/19 1146 10/10/19 1631 10/10/19 2213 10/11/19 0625  GLUCAP 122* 105* 132* 114* 172*   Lipid Profile: No results for input(s): CHOL, HDL, LDLCALC, TRIG, CHOLHDL, LDLDIRECT in the last 72 hours. Thyroid Function Tests: No results for input(s): TSH, T4TOTAL, FREET4, T3FREE, THYROIDAB in the last 72 hours. Anemia Panel: No results for input(s): VITAMINB12, FOLATE, FERRITIN, TIBC, IRON, RETICCTPCT in the last 72 hours. Sepsis Labs: No results for input(s): PROCALCITON, LATICACIDVEN in the last 168 hours.  No results found for this or any previous  visit (from the past 240 hour(s)).    Radiology Studies: Vas [place] Lower Extremity Venous (dvt)  Result Date: 10/09/2019  Lower Venous Study Indications: Stroke.  Limitations: Body habitus and poor ultrasound/tissue interface. Comparison Study: No prior study Performing Technologist: Maudry Mayhew MHA, RDMS, RVT, RDCS  Examination Guidelines: A complete evaluation includes B-mode imaging, spectral Doppler, color Doppler, and power Doppler as needed of all accessible portions of each vessel. Bilateral testing is considered an integral part of a complete examination. Limited examinations for reoccurring indications may be performed as noted.  +---------+---------------+---------+-----------+----------+--------------+ RIGHT    CompressibilityPhasicitySpontaneityPropertiesThrombus Aging +---------+---------------+---------+-----------+----------+--------------+ CFV      Full           Yes      Yes                                  +---------+---------------+---------+-----------+----------+--------------+ SFJ      Full                                                        +---------+---------------+---------+-----------+----------+--------------+ FV Prox  Full                                                        +---------+---------------+---------+-----------+----------+--------------+ FV Mid   Full                                                        +---------+---------------+---------+-----------+----------+--------------+ FV DistalFull                                                        +---------+---------------+---------+-----------+----------+--------------+ PFV      Full                                                        +---------+---------------+---------+-----------+----------+--------------+ POP      Full           Yes      Yes                                 +---------+---------------+---------+-----------+----------+--------------+ PTV      Full                                                        +---------+---------------+---------+-----------+----------+--------------+ PERO     Full                                                        +---------+---------------+---------+-----------+----------+--------------+  +---------+---------------+---------+-----------+----------+--------------+  LEFT     CompressibilityPhasicitySpontaneityPropertiesThrombus Aging +---------+---------------+---------+-----------+----------+--------------+ CFV      Full           Yes      Yes                                 +---------+---------------+---------+-----------+----------+--------------+ SFJ      Full                                                        +---------+---------------+---------+-----------+----------+--------------+ FV Prox  Full                                                         +---------+---------------+---------+-----------+----------+--------------+ FV Mid   Full                                                        +---------+---------------+---------+-----------+----------+--------------+ FV DistalFull                                                        +---------+---------------+---------+-----------+----------+--------------+ PFV      Full                                                        +---------+---------------+---------+-----------+----------+--------------+ POP      Full           Yes      Yes                                 +---------+---------------+---------+-----------+----------+--------------+ PTV      Full                                                        +---------+---------------+---------+-----------+----------+--------------+ PERO     Full                                                        +---------+---------------+---------+-----------+----------+--------------+  Summary: Right: There is no evidence of deep vein thrombosis in the lower extremity. However, portions of this examination were limited- see technologist comments above. No cystic structure found in the popliteal fossa. Left: There is no evidence of deep vein thrombosis in the lower extremity.  However, portions of this examination were limited- see technologist comments above. No cystic structure found in the popliteal fossa.  Unable to adequately visualize bilateral calf veins. *See table(s) above for measurements and observations. Electronically signed by Monica Martinez MD on 10/09/2019 at 7:16:08 PM.    Final       Scheduled Meds: . allopurinol  300 mg Oral Daily  . aspirin EC  81 mg Oral QHS  . Chlorhexidine Gluconate Cloth  6 each Topical Q0600  . clopidogrel  75 mg Oral Daily  . darbepoetin (ARANESP) injection - DIALYSIS  40 mcg Intravenous Q Mon-HD  . feeding supplement (NEPRO CARB STEADY)  237 mL Oral TID BM  . feeding  supplement (PRO-STAT SUGAR FREE 64)  30 mL Oral BID  . insulin aspart  0-9 Units Subcutaneous TID WC  . lidocaine  15 mL Mouth/Throat TID AC  . multivitamin  1 tablet Oral QHS  . omega-3 acid ethyl esters  1 g Oral Daily  . pantoprazole  40 mg Oral BID   Continuous Infusions: . dialysis solution 1.5% low-MG/low-CA       LOS: 10 days      Time spent: 35 minutes   Dessa Phi, DO [facility] 10/11/2019, 10:53 AM   Available via Epic secure chat 7am-7pm After these hours, please refer to coverage provider listed on [email or web address]

## 2019-10-11 NOTE — Progress Notes (Signed)
Daily Progress Note   Patient Name: Eileen Perez       Date: 10/11/2019 DOB: [birth year]  Age: 78 y.o. MRN#: [id number] Attending Physician: Dessa Phi, DO Primary Care Physician: Unk Pinto, MD Admit Date: 09/30/2019  Reason for Consultation/Follow-up: Establishing goals of care and Psychosocial/spiritual support  Subjective: Reviewed chart.  Discussed with Dr. Moshe Cipro.   Examined patient this am.  She does not know why she is in the hospital.  She does not know she has had a stroke.  Spoke with Sherylynn for 30 minutes regarding her mother's status, necessity of making DNR decision and completing a MOST form.  Discussed Issues for upcomming family conference call.  Held family conf call with patient's siblings, children and grand children.  Answered questions regarding strokes, PD vs HD, nutritional status, hypotension,  C-diff, bowel movements, strength and ambulation, atrial fibrillation, implanted loop recorder, and discussed carcinoid tumor and coordination of care between [place] and [name].  45 min.  Talked with patient and [name] about code status and attempted to review MOST form.  This was overwhelming for patient.  10 min.   Assessment: Stable, more alert, still confused and unable to answer some basic questions.   Patient Profile/HPI:   78 y.o. 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 diff infection  admitted on 09/30/2019 with increased confusion of unknown etiology. Could be a uremia factor to confusion with plans for hemodialysis trial. Also plans for MRI head.      Length of Stay: 10  Current Medications: Scheduled Meds:  . allopurinol  300  mg Oral Daily  . aspirin EC  81 mg Oral QHS  . Chlorhexidine Gluconate Cloth  6 each Topical Q0600  . clopidogrel  75 mg Oral Daily  . darbepoetin (ARANESP) injection - DIALYSIS  40 mcg Intravenous Q Mon-HD  . feeding supplement (NEPRO CARB STEADY)  237 mL Oral TID BM  . feeding supplement (PRO-STAT SUGAR FREE 64)  30 mL Oral BID  . insulin aspart  0-9 Units Subcutaneous TID WC  . lidocaine  15 mL Mouth/Throat TID AC  . multivitamin  1 tablet Oral QHS  . omega-3 acid ethyl esters  1 g Oral Daily  . pantoprazole  40 mg Oral  BID  . rosuvastatin  5 mg Oral q1800    Continuous Infusions: . dialysis solution 1.5% low-MG/low-CA      PRN Meds: acetaminophen **OR** acetaminophen, dianeal solution for CAPD/CCPD with heparin, heparin, ondansetron **OR** ondansetron (ZOFRAN) IV  Physical Exam        Well developed female, awake, alert, pleasant, + pingueculum CV tachy resp no distress Abdomen soft, nt, nd  Vital Signs: BP (!) 99/58 (BP Location: Left Arm)   Pulse (!) 101   Temp 98.8 F (37.1 C) (Oral)   Resp 18   Wt 47.1 kg   SpO2 98%   BMI 19.62 kg/m  SpO2: SpO2: 98 % O2 Device: O2 Device: Room Air O2 Flow Rate: O2 Flow Rate (L/min): 2 L/min  Intake/output summary:   Intake/Output Summary (Last 24 hours) at 10/11/2019 1912 Last data filed at 10/11/2019 2725 Gross per 24 hour  Intake 560 ml  Output 0 ml  Net 560 ml   LBM: Last BM Date: 10/10/19 Baseline Weight: Weight: 44.8 kg Most recent weight: Weight: 47.1 kg       Palliative Assessment/Data: 50%    Flowsheet Rows     Most Recent Value  Intake Tab  Referral Department  Hospitalist  Unit at Time of Referral  Cardiac/Telemetry Unit  Palliative Care Primary Diagnosis  Neurology  Date Notified  10/01/19  Palliative Care Type  New Palliative care  Reason for referral  Clarify Goals of Care  Date of Admission  09/30/19  Date first seen by Palliative Care  10/02/19  # of days Palliative referral response time  1  Day(s)  # of days IP prior to Palliative referral  1  Clinical Assessment  Psychosocial & Spiritual Assessment  Palliative Care Outcomes      Patient Active Problem List   Diagnosis Date Noted  . Palliative care by specialist   . Cerebral embolism with cerebral infarction 10/07/2019  . Goals of care, counseling/discussion   . Acute encephalopathy 09/30/2019  . Intractable nausea and vomiting 09/11/2019  . Diarrhea 09/11/2019  . Hypomagnesemia 09/11/2019  . Pressure injury of skin 09/11/2019  . Clostridioides difficile infection 09/11/2019  . Dehydration   . Hypotension 09/10/2019  . Esophagitis, [place] grade B 09/06/2019  . Gastritis 09/06/2019  . Protein-calorie malnutrition, severe 08/29/2019  . Small bowel obstruction ([place]) 08/28/2019  . Schatzki's ring of distal esophagus 06/23/2019  . 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 History of colon polyps 06/23/2019  . Enrolled in chronic care management 02/20/2019  . Aortic atherosclerosis ([place]) 02/13/2018  . 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 Overweight (BMI 25.0-29.9) 10/31/2015  . ESRD (end stage renal disease) ([place]) 10/31/2015  . OA (osteoarthritis) of hip 05/26/2015  . Secondary hyperparathyroidism (CKD) 04/16/2014  . Vitamin D deficiency 04/16/2014  . Hyperlipidemia, mixed 04/16/2014  . Medication management 04/16/2014  . Anemia of chronic Renal Dz   . Pancreatic cyst   . GERD   . Gout   . Osteopenia   . Essential hypertension 06/10/2013  . DJD 06/09/2013    Palliative Care Plan    Recommendations/Plan:  Palliative care outpatient follow up for on-going [place] and support.  Home health RN, PT, OT.  Family needs help gathering medical records to go to [name].  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  Thank you for allowing the Palliative Medicine Team to assist in the care of this patient.  Total time spent:  95 min.  Time in 12:00 - 12:35 TIme in 3:00 - 3:45 Time in:  4:00 - 4:15     Greater than 50%  of this time was spent counseling and coordinating care related to the above assessment and plan.  Florentina Jenny, PA-C Palliative Medicine  Please contact Palliative MedicineTeam phone at [phone] for questions and concerns between 7 am - 7 pm.   Please see AMION for individual provider pager numbers.

## 2019-10-11 NOTE — Plan of Care (Signed)
  Problem: Education: Goal: Knowledge of General Education information will improve Description Including pain rating scale, medication(s)/side effects and non-pharmacologic comfort measures Outcome: Progressing   

## 2019-10-12 LAB — CBC
HCT: 29.6 % — ABNORMAL LOW (ref 36.0–46.0)
Hemoglobin: 8.4 g/dL — ABNORMAL LOW (ref 12.0–15.0)
MCH: 29.9 pg (ref 26.0–34.0)
MCHC: 28.4 g/dL — ABNORMAL LOW (ref 30.0–36.0)
MCV: 105.3 fL — ABNORMAL HIGH (ref 80.0–100.0)
Platelets: 122 10*3/uL — ABNORMAL LOW (ref 150–400)
RBC: 2.81 MIL/uL — ABNORMAL LOW (ref 3.87–5.11)
RDW: 17.5 % — ABNORMAL HIGH (ref 11.5–15.5)
WBC: 11.6 10*3/uL — ABNORMAL HIGH (ref 4.0–10.5)
nRBC: 0 % (ref 0.0–0.2)

## 2019-10-12 LAB — BASIC METABOLIC PANEL
Anion gap: 12 (ref 5–15)
BUN: 74 mg/dL — ABNORMAL HIGH (ref 8–23)
CO2: 25 mmol/L (ref 22–32)
Calcium: 8.9 mg/dL (ref 8.9–10.3)
Chloride: 102 mmol/L (ref 98–111)
Creatinine, Ser: 5.57 mg/dL — ABNORMAL HIGH (ref 0.44–1.00)
GFR calc Af Amer: 8 mL/min — ABNORMAL LOW (ref >60)
GFR calc non Af Amer: 7 mL/min — ABNORMAL LOW (ref >60)
Glucose, Bld: 114 mg/dL — ABNORMAL HIGH (ref 70–99)
Potassium: 4 mmol/L (ref 3.5–5.1)
Sodium: 139 mmol/L (ref 135–145)

## 2019-10-12 LAB — RENAL FUNCTION PANEL
Albumin: 1.4 g/dL — ABNORMAL LOW (ref 3.5–5.0)
Anion gap: 13 (ref 5–15)
BUN: 73 mg/dL — ABNORMAL HIGH (ref 8–23)
CO2: 25 mmol/L (ref 22–32)
Calcium: 9 mg/dL (ref 8.9–10.3)
Chloride: 102 mmol/L (ref 98–111)
Creatinine, Ser: 5.49 mg/dL — ABNORMAL HIGH (ref 0.44–1.00)
GFR calc Af Amer: 8 mL/min — ABNORMAL LOW (ref >60)
GFR calc non Af Amer: 7 mL/min — ABNORMAL LOW (ref >60)
Glucose, Bld: 109 mg/dL — ABNORMAL HIGH (ref 70–99)
Phosphorus: 3.7 mg/dL (ref 2.5–4.6)
Potassium: 4 mmol/L (ref 3.5–5.1)
Sodium: 140 mmol/L (ref 135–145)

## 2019-10-12 LAB — GLUCOSE, CAPILLARY: Glucose-Capillary: 101 mg/dL — ABNORMAL HIGH (ref 70–99)

## 2019-10-12 MED ORDER — CINACALCET HCL 30 MG PO TABS: ORAL_TABLET | ORAL | 1 refills | Status: AC

## 2019-10-12 MED ORDER — HEPARIN SODIUM (PORCINE) 1000 UNIT/ML IJ SOLN
INTRAMUSCULAR | Status: AC
  Filled 2019-10-12: qty 4

## 2019-10-12 MED ORDER — ASPIRIN 81 MG PO TABS: 81.0000 mg | ORAL_TABLET | Freq: Every day | ORAL | 0 refills | Status: AC

## 2019-10-12 MED ORDER — DARBEPOETIN ALFA 40 MCG/0.4ML IJ SOSY
PREFILLED_SYRINGE | INTRAMUSCULAR | Status: AC
  Administered 2019-10-12: 40 ug
  Filled 2019-10-12: qty 0.4

## 2019-10-12 MED ORDER — SUCRALFATE 1 GM/10ML PO SUSP: ORAL | 1 refills | Status: AC

## 2019-10-12 MED ORDER — SEVELAMER CARBONATE 800 MG PO TABS: 800.0000 mg | ORAL_TABLET | Freq: Three times a day (TID) | ORAL | 3 refills | Status: AC

## 2019-10-12 MED ORDER — CLOPIDOGREL BISULFATE 75 MG PO TABS: 75.0000 mg | ORAL_TABLET | Freq: Every day | ORAL | 0 refills | Status: DC

## 2019-10-12 NOTE — Progress Notes (Signed)
DISCHARGE NOTE HOME Timiya Howells to be discharged Rehab per MD order. Discussed prescriptions and follow up appointments with the patient and her grandaughter. Prescriptions given to patient; medication list explained in detail. Patient verbalized understanding.  Skin clean, dry and intact without evidence of skin break down, no evidence of skin tears noted. IV catheter discontinued intact. Site without signs and symptoms of complications. Dressing and pressure applied. Pt denies pain at the site currently. No complaints noted.  Patient free of lines, drains, and wounds.   An After Visit Summary (AVS) was printed and given to the patient. Patient escorted via wheelchair, and discharged home via private auto.  Dorthey Sawyer, RN

## 2019-10-12 NOTE — Discharge Summary (Signed)
Physician Discharge Summary  Eileen Perez FHQ:[id number] DOB: 07-16-1941 DOA: 09/30/2019  PCP: Unk Pinto, MD  Admit date: 09/30/2019 Discharge date: 10/12/2019  Admitted From: Home Disposition:  Home with home health, declined SNF. Patient's family arranging for patient to move to [place] to live with her son in the next week or so.   Recommendations for Outpatient Follow-up:  1. Recommend patient to establish with PCP, nephrology, cardiology, neurology in [place] as soon as able.  Discharge Condition: Stable CODE STATUS: Full code Diet recommendation: Renal diet  Brief/Interim Summary: Eileen Perez is a 78 y.o.year-old who lives alone with ESRD on peritoneal dialysis, gastric carcinoid, gout, DM2presenting to ED with confusion. She lives alone. Her granddaugther noted confusion over the weekend and her [facility] went to check on her on Tuesday and noted severe confusion. She was also eating poorly this past week. Work up revealed acute CVA.  She was transitioned from peritoneal dialysis to hemodialysis during hospitalization.  Discharge Diagnoses:  Principal Problem:   Acute encephalopathy Active Problems:   Essential hypertension   Anemia of chronic Renal Dz   ESRD (end stage renal disease) (HCC)   Carcinoid tumor of stomach   Goals of care, counseling/discussion   Cerebral embolism with cerebral infarction   Palliative care by specialist   Acute CVA -MRI brain: Multiple small areas of acute infarction bilaterally in different vascular distributions, suggestive of multiple small emboli. -MRA brain: No proximal intracranial vessel occlusion or significant stenosis. -Echo: EF 60-65%, grade 1 dd  -Carotis [place]: 1-39% stenosis bilaterally  -DVT US: negative bilaterally  -Neurology consulted, stroke work-up completed  -Telemetry shows no A. fib, 2D echo without intracardiac thrombus. -EP recommended 30 day event monitoring  -Currently she is on aspirin and Plavix for 3  weeks, then plavix alone. Continue crestor  -Neurology signed off 11/19; follow up with stroke clinic in 4 weeks or establish with neurology when patient moves to [place]   Acute metabolic encephalopathy -This is likely secondary to acute CVA -B12, folate and TSH WNL.  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 If you have any questions about your discharge medications or the care you received while you were in the hospital after you are discharged, you can call the unit and ask to speak with the hospitalist on call if the hospitalist that took care of you is not available. Once you are discharged, your primary care physician will handle any further medical issues. Please note that NO REFILLS for any discharge medications will be authorized once you are discharged, as it is imperative that you return to your primary care physician (or establish a relationship with a primary care physician if you do not have one) for your aftercare needs so that they can reassess your need for medications and monitor your lab values.   Discharge instructions   Complete by: As directed    Take aspirin and plavix daily for 3 weeks, then stop aspirin and continue plavix alone indefinitely   For home use only DME 3 n  1   Complete by: As directed    Increase activity slowly   Complete by: As directed      Allergies as of 10/12/2019      Reactions   Ace Inhibitors Other (See Comments)   unknown   Nsaids Other (See  Comments)   unknown      Medication List    STOP taking these medications   Potassium Chloride ER 20 MEQ Tbcr   Vancomycin HCl 50 MG/ML Solr     TAKE these medications   accu-chek soft touch lancets Use as instructed   allopurinol 300 MG tablet Commonly known as: ZYLOPRIM Take 1 tablet daily for gout   alum & mag hydroxide-simeth 200-200-20 MG/5ML suspension Commonly known as: MAALOX/MYLANTA Take 15 mLs by mouth 3 (three) times daily before meals.   aspirin 81 MG tablet Take 1 tablet (81 mg total) by mouth at bedtime for 21 days.   b complex-vitamin c-folic acid 0.8 MG Tabs tablet Take 1 tablet by mouth daily.   Blood Glucose Monitoring Suppl Devi Check blood glucose before meals, at bedtime and as needed.   cinacalcet 30 MG tablet Commonly known as: SENSIPAR NEW PRESCRIPTION REQUEST: TAKE ONE TABLET BY MOUTH DAILY   clopidogrel 75 MG tablet Commonly known as: PLAVIX Take 1 tablet (75 mg total) by mouth daily.   feeding supplement (NEPRO CARB STEADY) Liqd Take 237 mLs by mouth 3 (three) times daily between meals.   gentamicin cream 0.1 % Commonly known as: GARAMYCIN Apply 1 application topically daily. What changed: when to take this   glucose blood test strip Use as instructed   lidocaine 2 % solution Commonly known as: XYLOCAINE Use as directed 15 mLs in the mouth or throat 3 (three) times daily before meals.   ondansetron 4 MG disintegrating tablet Commonly known as: ZOFRAN-ODT Take 1 tablet every 8 hours as needed for nausea and vomiting.   pantoprazole 40 MG tablet Commonly known as: Protonix Takes 1 tablet twice a day for reflux. What changed:   how much to take  how to take this  when to take this  additional instructions   rosuvastatin 5 MG tablet Commonly known as: CRESTOR Take 1 tablet after supper for cholesterol. What changed:   how much to take  how to take this  when to take this   sevelamer carbonate 800 MG  tablet Commonly known as: RENVELA Take 1 tablet (800 mg total) by mouth 3 (three) times daily with meals.   sucralfate 1 GM/10ML suspension Commonly known as: CARAFATE NEW PRESCRIPTION REQUEST: TAKE 10 MLS BY MOUTH FOUR TIMES DAILY   ZINC GLUCONATE PO Take 40 mg by mouth daily.            Durable Medical Equipment  (From admission, onward)         Start     Ordered   09/30/19 1900  For home use only DME Tub bench  Once     09/30/19 1859   09/30/19 1859  For home use only DME Shower stool  Once     09/30/19 1859   09/30/19 0000  For home use only DME 3 n 1     09/30/19 1837         Follow-up Information    Unk Pinto, MD. Call in 1 day.   Specialty: Internal Medicine Why: and discuss your visit here and see when they want to see you back in the hospital. Contact information: [place]  [place] [address] [phone]        [facility] Follow up.   Why: Pick up Bedside Commode and Shower Chair Contact information: [address], [place] [address] [phone]       [facility] Follow up.   Why: for Palliative Supportive care consult Contact information: [place], [facility] Follow up.   Specialty: Home Health Services Why: Registered Nurse, Physical Therapy, Aide, Occupational Therapy, Social Worker,  Contact information: [address] [place] [address] [phone]        Baldwin [place], PA-C Follow up on 11/26/2019.   Specialty: Cardiology Why: at 0930 for 6 week post hospital follow up. Discuss event monitor and possible loop recorder Contact information: [address] [place] [address] [phone]        [facility]. Schedule an appointment as soon as possible for a visit in 4 week(s).   Why: Call to set up appointment for stroke follow up in  4 weeks, if patient still remains in [place] area  [place] information: [place]     [address] [place] [address] [phone]       [place], [place]. Schedule an appointment as soon as possible for a visit in 1 week(s).   Why: Follow up with Nephrology Contact information: [address] [place] [address] [phone]          Allergies  Allergen Reactions  . Ace Inhibitors Other (See Comments)    unknown  . Nsaids Other (See Comments)    unknown    Consultations:  Nephrology  Palliative care  IR  Neurology  EP   Procedures/Studies: Ct Head Wo Contrast  Result Date: 09/30/2019 CLINICAL DATA:  Altered level of consciousness. EXAM: CT HEAD WITHOUT CONTRAST TECHNIQUE: Contiguous axial images were obtained from the base of the skull through the vertex without intravenous contrast. COMPARISON:  September 10, 2015. FINDINGS: Brain: Mild diffuse cortical atrophy is noted. Mild chronic ischemic white matter disease is noted. No mass effect or midline shift is noted. Ventricular size is within normal limits. There is no evidence of mass lesion, hemorrhage or acute infarction. Vascular: No hyperdense vessel or unexpected calcification. Skull: Normal. Negative for fracture or focal lesion. Sinuses/Orbits: Stable left maxillary mucous retention cyst is noted. Other: None. IMPRESSION: Mild diffuse cortical atrophy. Mild chronic ischemic white matter disease. No acute intracranial abnormality seen. Electronically Signed   By: Marijo Conception M.D.   On: 09/30/2019 15:12   Mr Jodene Nam Head Wo Contrast  Result Date: 10/08/2019 CLINICAL DATA:  Confusion EXAM: MRA HEAD WITHOUT CONTRAST TECHNIQUE: Angiographic images of the Circle of Willis were obtained using MRA technique without intravenous contrast. COMPARISON:  None. FINDINGS: Intracranial internal carotid arteries, middle cerebral arteries, and anterior cerebral arteries are patent. There is a patent left  posterior communicating artery. Intracranial vertebral arteries, posterior inferior cerebellar arteries basilar artery, and posterior cerebral arteries are patent. No significant stenosis or aneurysm. IMPRESSION: No proximal intracranial vessel occlusion or significant stenosis. Electronically Signed   By: Macy Mis M.D.   On: 10/08/2019 07:12   Mr Brain Wo Contrast  Result Date: 10/07/2019 CLINICAL DATA:  Altered level of consciousness. ESRD on peritoneal dialysis. Confusion. EXAM: MRI HEAD WITHOUT CONTRAST TECHNIQUE: Multiplanar, multiecho pulse sequences of the brain and surrounding structures were obtained without intravenous contrast. COMPARISON:  CT head 09/30/2019  FINDINGS: Brain: Multiple small areas of restricted diffusion are present compatible with acute infarct. Small acute infarcts in the cerebellum bilaterally. Scattered small acute cortical infarcts in the parietal lobes bilaterally. Acute infarct in the left frontal white matter and adjacent cortex. No associated hemorrhage. Moderate atrophy without hydrocephalus.  Negative for mass or edema. Vascular: Normal arterial flow voids. Skull and upper cervical spine: No focal skeletal lesion. Sinuses/Orbits: Surgery. Mucosal edema paranasal sinuses. Bilateral cataract Other: None IMPRESSION: Multiple small areas of acute infarction bilaterally in different vascular distributions, suggestive of multiple small emboli. No associated hemorrhage Moderate atrophy Electronically Signed   By: Franchot Gallo M.D.   On: 10/07/2019 11:26   Ir Fluoro Guide Cv Line Right  Result Date: 10/03/2019 INDICATION: 78 year old female with a history of peritoneal hemodialysis EXAM: TUNNELED CENTRAL VENOUS HEMODIALYSIS CATHETER PLACEMENT WITH ULTRASOUND AND FLUOROSCOPIC GUIDANCE MEDICATIONS: 2 g Ancef. The antibiotic was given in an appropriate time interval prior to skin puncture. ANESTHESIA/SEDATION: Moderate (conscious) sedation was employed during this  procedure. A total of Versed 1.0 mg and Fentanyl 25 mcg was administered intravenously. Moderate Sedation Time: 13 minutes. The patient's level of consciousness and vital signs were monitored continuously by radiology nursing throughout the procedure under my direct supervision. FLUOROSCOPY TIME:  Fluoroscopy Time: 0 minutes 6 seconds (1 mGy). COMPLICATIONS: None PROCEDURE: Informed written consent was obtained from the patient after a discussion of the risks, benefits, and alternatives to treatment. Questions regarding the procedure were encouraged and answered. The right neck and chest were prepped with chlorhexidine in a sterile fashion, and a sterile drape was applied covering the operative field. Maximum barrier sterile technique with sterile gowns and gloves were used for the procedure. A timeout was performed prior to the initiation of the procedure. After creating a small venotomy incision, a micropuncture kit was utilized to access the right internal jugular vein under direct, real-time ultrasound guidance after the overlying soft tissues were anesthetized with 1% lidocaine with epinephrine. Ultrasound image documentation was performed. The microwire was marked to measure appropriate internal catheter length. External tunneled length was estimated. A total tip to cuff length of 19 cm was selected. Skin and subcutaneous tissues of chest wall below the clavicle were generously infiltrated with 1% lidocaine for local anesthesia. A small stab incision was made with 11 blade scalpel. The selected hemodialysis catheter was tunneled in a retrograde fashion from the anterior chest wall to the venotomy incision. A guidewire was advanced to the level of the IVC and the micropuncture sheath was exchanged for a peel-away sheath. The catheter was then placed through the peel-away sheath with tips ultimately positioned within the superior aspect of the right atrium. Final catheter positioning was confirmed and documented  with a spot radiographic image. The catheter aspirates and flushes normally. The catheter was flushed with appropriate volume heparin dwells. The catheter exit site was secured with a 0-Prolene retention suture. The venotomy incision was closed Derma bond and sterile dressing. Dressings were applied at the chest wall. Patient tolerated the procedure well and remained hemodynamically stable throughout. No complications were encountered and no significant blood loss encountered. IMPRESSION: Status post right IJ tunneled hemodialysis catheter placement. Catheter ready for use. Signed, Dulcy Fanny. Dellia Nims, RPVI [facility] Radiology Electronically Signed   By: Corrie Mckusick D.O.   On: 10/03/2019 15:31   Ir US Guide Vasc Access Right  Result Date: 10/03/2019 INDICATION: 78 year old female with a history of peritoneal hemodialysis EXAM: TUNNELED CENTRAL VENOUS HEMODIALYSIS  CATHETER PLACEMENT WITH ULTRASOUND AND FLUOROSCOPIC GUIDANCE MEDICATIONS: 2 g Ancef. The antibiotic was given in an appropriate time interval prior to skin puncture. ANESTHESIA/SEDATION: Moderate (conscious) sedation was employed during this procedure. A total of Versed 1.0 mg and Fentanyl 25 mcg was administered intravenously. Moderate Sedation Time: 13 minutes. The patient's level of consciousness and vital signs were monitored continuously by radiology nursing throughout the procedure under my direct supervision. FLUOROSCOPY TIME:  Fluoroscopy Time: 0 minutes 6 seconds (1 mGy). COMPLICATIONS: None PROCEDURE: Informed written consent was obtained from the patient after a discussion of the risks, benefits, and alternatives to treatment. Questions regarding the procedure were encouraged and answered. The right neck and chest were prepped with chlorhexidine in a sterile fashion, and a sterile drape was applied covering the operative field. Maximum barrier sterile technique with sterile gowns and  gloves were used for the procedure. A timeout was performed prior to the initiation of the procedure. After creating a small venotomy incision, a micropuncture kit was utilized to access the right internal jugular vein under direct, real-time ultrasound guidance after the overlying soft tissues were anesthetized with 1% lidocaine with epinephrine. Ultrasound image documentation was performed. The microwire was marked to measure appropriate internal catheter length. External tunneled length was estimated. A total tip to cuff length of 19 cm was selected. Skin and subcutaneous tissues of chest wall below the clavicle were generously infiltrated with 1% lidocaine for local anesthesia. A small stab incision was made with 11 blade scalpel. The selected hemodialysis catheter was tunneled in a retrograde fashion from the anterior chest wall to the venotomy incision. A guidewire was advanced to the level of the IVC and the micropuncture sheath was exchanged for a peel-away sheath. The catheter was then placed through the peel-away sheath with tips ultimately positioned within the superior aspect of the right atrium. Final catheter positioning was confirmed and documented with a spot radiographic image. The catheter aspirates and flushes normally. The catheter was flushed with appropriate volume heparin dwells. The catheter exit site was secured with a 0-Prolene retention suture. The venotomy incision was closed Derma bond and sterile dressing. Dressings were applied at the chest wall. Patient tolerated the procedure well and remained hemodynamically stable throughout. No complications were encountered and no significant blood loss encountered. IMPRESSION: Status post right IJ tunneled hemodialysis catheter placement. Catheter ready for use. Signed, Dulcy Fanny. Dellia Nims, RPVI [facility] Radiology Electronically Signed   By: Corrie Mckusick D.O.   On: 10/03/2019 15:31   Dg  Chest Port 1 View  Result Date: 09/30/2019 CLINICAL DATA:  Altered EXAM: PORTABLE CHEST 1 VIEW COMPARISON:  Chest radiograph 09/02/2019 FINDINGS: Heart size within normal limits.  Aortic atherosclerosis. There is no focal consolidation within the lungs. No evidence of pleural effusion or pneumothorax. No acute bony abnormality. Overlying monitoring leads. IMPRESSION: No evidence of acute cardiopulmonary disease. Aortic atherosclerosis. Electronically Signed   By: Kellie Simmering DO   On: 09/30/2019 15:38   Vas US Carotid  Result Date: 10/09/2019 Carotid Arterial Duplex Study Indications:       CVA. Risk Factors:      None. Limitations        Today's exam was limited due to a central line and Bandages. Comparison Study:  No prior studies. Performing Technologist: Oliver Hum RVT  Examination Guidelines: A complete evaluation includes B-mode imaging, spectral Doppler, color Doppler, and power Doppler as needed of all accessible portions of each vessel. Bilateral testing is considered  an integral part of a complete examination. Limited examinations for reoccurring indications may be performed as noted.  Right Carotid Findings: +----------+--------+--------+--------+--------------------------+--------+           PSV cm/sEDV cm/sStenosisPlaque Description        Comments +----------+--------+--------+--------+--------------------------+--------+ CCA Prox  83      13              smooth and heterogenous            +----------+--------+--------+--------+--------------------------+--------+ CCA Distal55      11              smooth and heterogenous            +----------+--------+--------+--------+--------------------------+--------+ ICA Prox  47      13              irregular and heterogenoustortuous +----------+--------+--------+--------+--------------------------+--------+ ICA Distal71      25                                        tortuous  +----------+--------+--------+--------+--------------------------+--------+ ECA       84      0                                                  +----------+--------+--------+--------+--------------------------+--------+ +----------+--------+-------+------------+-------------------+           PSV cm/sEDV cmsDescribe    Arm Pressure (mmHG) +----------+--------+-------+------------+-------------------+ Subclavian               Not assessed                    +----------+--------+-------+------------+-------------------+ +---------+--------+--+--------+--+---------+ VertebralPSV cm/s77EDV cm/s17Antegrade +---------+--------+--+--------+--+---------+  Left Carotid Findings: +----------+--------+--------+--------+-----------------------+--------+           PSV cm/sEDV cm/sStenosisPlaque Description     Comments +----------+--------+--------+--------+-----------------------+--------+ CCA Prox  67      13              smooth and heterogenous         +----------+--------+--------+--------+-----------------------+--------+ CCA Distal61      16              smooth and heterogenous         +----------+--------+--------+--------+-----------------------+--------+ ICA Prox  71      20              smooth and heterogenoustortuous +----------+--------+--------+--------+-----------------------+--------+ ICA Distal86      27                                     tortuous +----------+--------+--------+--------+-----------------------+--------+ ECA       72      4                                               +----------+--------+--------+--------+-----------------------+--------+ +----------+--------+--------+--------+-------------------+           PSV cm/sEDV cm/sDescribeArm Pressure (mmHG) +----------+--------+--------+--------+-------------------+ [id number]                                          +----------+--------+--------+--------+-------------------+ +---------+--------+---+--------+--+---------+  VertebralPSV cm/s116EDV cm/s32Antegrade +---------+--------+---+--------+--+---------+  Summary: Right Carotid: Velocities in the right ICA are consistent with a 1-39% stenosis. Left Carotid: Velocities in the left ICA are consistent with a 1-39% stenosis. Vertebrals: Bilateral vertebral arteries demonstrate antegrade flow. *See table(s) above for measurements and observations.  Electronically signed by Antony Contras MD on 10/09/2019 at 6:08:11 PM.    Final    Vas [place] Lower Extremity Venous (dvt)  Result Date: 10/09/2019  Lower Venous Study Indications: Stroke.  Limitations: Body habitus and poor ultrasound/tissue interface. Comparison Study: No prior study Performing Technologist: Maudry Mayhew MHA, RDMS, RVT, RDCS  Examination Guidelines: A complete evaluation includes B-mode imaging, spectral Doppler, color Doppler, and power Doppler as needed of all accessible portions of each vessel. Bilateral testing is considered an integral part of a complete examination. Limited examinations for reoccurring indications may be performed as noted.  +---------+---------------+---------+-----------+----------+--------------+ RIGHT    CompressibilityPhasicitySpontaneityPropertiesThrombus Aging +---------+---------------+---------+-----------+----------+--------------+ CFV      Full           Yes      Yes                                 +---------+---------------+---------+-----------+----------+--------------+ SFJ      Full                                                        +---------+---------------+---------+-----------+----------+--------------+ FV Prox  Full                                                        +---------+---------------+---------+-----------+----------+--------------+ FV Mid   Full                                                         +---------+---------------+---------+-----------+----------+--------------+ FV DistalFull                                                        +---------+---------------+---------+-----------+----------+--------------+ PFV      Full                                                        +---------+---------------+---------+-----------+----------+--------------+ POP      Full           Yes      Yes                                 +---------+---------------+---------+-----------+----------+--------------+ PTV      Full                                                        +---------+---------------+---------+-----------+----------+--------------+  PERO     Full                                                        +---------+---------------+---------+-----------+----------+--------------+  +---------+---------------+---------+-----------+----------+--------------+ LEFT     CompressibilityPhasicitySpontaneityPropertiesThrombus Aging +---------+---------------+---------+-----------+----------+--------------+ CFV      Full           Yes      Yes                                 +---------+---------------+---------+-----------+----------+--------------+ SFJ      Full                                                        +---------+---------------+---------+-----------+----------+--------------+ FV Prox  Full                                                        +---------+---------------+---------+-----------+----------+--------------+ FV Mid   Full                                                        +---------+---------------+---------+-----------+----------+--------------+ FV DistalFull                                                        +---------+---------------+---------+-----------+----------+--------------+ PFV      Full                                                         +---------+---------------+---------+-----------+----------+--------------+ POP      Full           Yes      Yes                                 +---------+---------------+---------+-----------+----------+--------------+ PTV      Full                                                        +---------+---------------+---------+-----------+----------+--------------+ PERO     Full                                                        +---------+---------------+---------+-----------+----------+--------------+  Summary: Right: There is no evidence of deep vein thrombosis in the lower extremity. However, portions of this examination were limited- see technologist comments above. No cystic structure found in the popliteal fossa. Left: There is no evidence of deep vein thrombosis in the lower extremity. However, portions of this examination were limited- see technologist comments above. No cystic structure found in the popliteal fossa.  Unable to adequately visualize bilateral calf veins. *See table(s) above for measurements and observations. Electronically signed by Monica Martinez MD on 10/09/2019 at 7:16:08 PM.    Final     Echo IMPRESSIONS  1. Left ventricular ejection fraction, by visual estimation, is 60 to 65%. The left ventricle has normal function. There is no left ventricular hypertrophy.  2. Left ventricular diastolic parameters are consistent with Grade I diastolic dysfunction (impaired relaxation).  3. The left ventricle has no regional wall motion abnormalities.  4. Global right ventricle has normal systolic function.The right ventricular size is normal. No increase in right ventricular wall thickness.  5. Left atrial size was normal.  6. Right atrial size was normal.  7. Trivial pericardial effusion is present.  8. The mitral valve is normal in structure. Trace mitral valve regurgitation. No evidence of mitral stenosis.  9. The tricuspid valve is normal in structure.  Tricuspid valve regurgitation is trivial. 10. The aortic valve is tricuspid. Aortic valve regurgitation is not visualized. Mild aortic valve sclerosis without stenosis. 11. TR signal is inadequate for assessing pulmonary artery systolic pressure. 12. The inferior vena cava is normal in size with greater than 50% respiratory variability, suggesting right atrial pressure of 3 mmHg.    Discharge Exam: Vitals:   10/11/19 2134 10/12/19 0501  BP: (!) 94/54 (!) 81/48  Pulse: 99 98  Resp: 14 16  Temp: 98.9 F (37.2 C) 99.2 F (37.3 C)  SpO2: 100% 99%     General: Pt is alert, awake, not in acute distress Cardiovascular: RRR, S1/S2 +, no edema Respiratory: CTA bilaterally, no wheezing, no rhonchi, no respiratory distress, no conversational dyspnea  Abdominal: Soft, NT, ND, bowel sounds + Extremities: no edema, no cyanosis Psych: Mild confusion     The results of significant diagnostics from this hospitalization (including imaging, microbiology, ancillary and laboratory) are listed below for reference.     Microbiology: No results found for this or any previous visit (from the past 240 hour(s)).   Labs: BNP (last 3 results) Recent Labs    08/28/19 1930  BNP 01.0   Basic Metabolic Panel: Recent Labs  Lab 10/06/19 0804 10/08/19 0453 10/08/19 1307 10/10/19 1245 10/11/19 0833 10/12/19 0509  NA 140 143 142 140 141 140  139  K 4.0 4.3 4.3 3.5 3.4* 4.0  4.0  CL 102 102 103 101 104 102  102  CO2 26 26 26 25 25 25  25   GLUCOSE 120* 117* 132* 102* 203* 109*  114*  BUN 60* 50* 59* 14 43* 73*  74*  CREATININE 8.07* 6.36* 6.85* 2.18* 4.31* 5.49*  5.57*  CALCIUM 9.9 9.6 9.6 7.9* 8.7* 9.0  8.9  PHOS 4.5 2.9 3.2 1.5*  --  3.7   Liver Function Tests: Recent Labs  Lab 10/06/19 0804 10/08/19 0453 10/08/19 1307 10/10/19 1245 10/12/19 0509  ALBUMIN 1.6* 1.5* 1.6* 1.6* 1.4*   No results for input(s): LIPASE, AMYLASE in the last 168 hours. No results for input(s):  AMMONIA in the last 168 hours. CBC: Recent Labs  Lab 10/08/19 0453 10/08/19 1307 10/10/19 1245 10/11/19 0833 10/12/19  0509  WBC 12.8* 13.3* 12.1* 10.1 11.6*  HGB 9.0* 9.1* 9.4* 8.6* 8.4*  HCT 31.5* 32.2* 32.0* 29.9* 29.6*  MCV 103.3* 104.5* 103.6* 106.0* 105.3*  PLT 183 193 127* 124* 122*   Cardiac Enzymes: No results for input(s): CKTOTAL, CKMB, CKMBINDEX, TROPONINI in the last 168 hours. BNP: Invalid input(s): POCBNP CBG: Recent Labs  Lab 10/11/19 0625 10/11/19 1115 10/11/19 1639 10/11/19 2136 10/12/19 0614  GLUCAP 172* 129* 137* 141* 101*   D-Dimer No results for input(s): DDIMER in the last 72 hours. Hgb A1c No results for input(s): HGBA1C in the last 72 hours. Lipid Profile No results for input(s): CHOL, HDL, LDLCALC, TRIG, CHOLHDL, LDLDIRECT in the last 72 hours. Thyroid function studies No results for input(s): TSH, T4TOTAL, T3FREE, THYROIDAB in the last 72 hours.  Invalid input(s): FREET3 Anemia work up No results for input(s): VITAMINB12, FOLATE, FERRITIN, TIBC, IRON, RETICCTPCT in the last 72 hours. Urinalysis    Component Value Date/Time   COLORURINE AMBER (A) 10/02/2019 1600   APPEARANCEUR CLOUDY (A) 10/02/2019 1600   LABSPEC 1.012 10/02/2019 1600   PHURINE 6.0 10/02/2019 1600   GLUCOSEU 50 (A) 10/02/2019 1600   HGBUR MODERATE (A) 10/02/2019 1600   BILIRUBINUR NEGATIVE 10/02/2019 1600   KETONESUR NEGATIVE 10/02/2019 1600   PROTEINUR >=300 (A) 10/02/2019 1600   UROBILINOGEN 0.2 05/19/2015 0942   NITRITE NEGATIVE 10/02/2019 1600   LEUKOCYTESUR LARGE (A) 10/02/2019 1600   Sepsis Labs Invalid input(s): PROCALCITONIN,  WBC,  LACTICIDVEN Microbiology No results found for this or any previous visit (from the past 240 hour(s)).   Patient was seen and examined on the day of discharge and was found to be in stable condition. Time coordinating discharge: 45 minutes including assessment and coordination of care, as well as examination of the patient.    SIGNED:  Dessa Phi, DO [facility] 10/12/2019, 10:04 AM

## 2019-10-12 NOTE — TOC Transition Note (Signed)
Transition of Care [facility]) - CM/SW Discharge Note   Patient Details  Name: Eileen Perez MRN: [id number] Date of Birth: [birth year]  [facility] CM/SW Contact:  Carles Collet, RN Phone Number: 10/12/2019, 11:46 AM   Clinical Narrative:   Patient active w Al[facility] Lemmings, notified liaison of DC today. Patient will have transport via family. No other CM needs identified.     Final next level of care: Elim Barriers to Discharge: No Barriers Identified   Patient Goals and CMS Choice        Discharge Placement                       Discharge Plan and Services                            [place]: [place] Date [place]: 10/12/19 Time [place]: [id number] Representative spoke with at [place]: Tommi Rumps, for resumption of services  Social Determinants of Health ([place]) Interventions     Readmission Risk Interventions Readmission Risk Prevention Plan 09/12/2019  Transportation Screening Complete  Social Work Consult for [place] Planning/Counseling [place] Screening Complete  Medication Review Press photographer) Complete  Some recent data might be hidden

## 2019-10-12 NOTE — Discharge Instructions (Signed)
Eating Plan for Dialysis Dialysis is a procedure that is done when the kidneys have stopped working properly (kidney failure). During dialysis, wastes, salt, and extra water are removed from the blood, and the levels of certain minerals in the blood are maintained. If you are undergoing dialysis, it is important to pay careful attention to what you eat. Between dialysis sessions, certain nutrients and wastes can build up in your blood and cause you to get sick. Even though nutrients, such as carbohydrates, fats, vitamins, and minerals, are an important part of a healthy diet, you may need to limit your intake of certain nutrients when you are on dialysis. When you are on dialysis, it is important that you work with your health care provider or a diet and nutrition specialist (dietitian) to help you make an eating plan that meets your specific needs. What are tips for following this plan? Reading food labels  Check food labels for the amount of: ? Potassium. This is found in milk, fruits, and vegetables. ? Phosphorus. This is found in milk, cheese, beans, nuts, and carbonated beverages. ? Sodium. Sodium content is high in processed and cured meats, ready-made frozen meals, canned vegetables, and salty snack foods.  Try to find foods that are low in potassium, phosphorus, and sodium.  Look for foods that are labeled "sodium free," "reduced sodium," or "low sodium." Shopping  Do not buy whole-grain and high-fiber foods because they contain high amounts of phosphorus.  Do not buy or use salt substitutes because they contain potassium.  Do not buy processed foods. These are usually high in sodium and phosphorus. Cooking  Drain all fluid from cooked vegetables and canned fruits before eating them.  Cut potatoes into small pieces and boil them in unsalted water before you eat them. This can help to remove some potassium from the potato.  To add flavor, try using herbs and spices that do not  contain sodium. Meal planning      Most people on dialysis should try to eat: ? 6-11 servings of grains each day. One serving is equal to 1 slice of bread or  cup of cooked rice or pasta. ? 2-3 servings of low-potassium vegetables each day. One serving is equal to  cup. ? 2-3 servings of low-potassium fruits each day. One serving is equal to  cup. ? High-quality proteins, such as meat, poultry, fish, and eggs. Talk with your health care provider or dietitian about the right amount and type of protein to include in your eating plan. ?  cup of dairy each day.  Avoid foods that are high in sodium and phosphorus. Foods that generally are very salty will be high in sodium, and foods that are high in fiber or starch may be high in phosphorus. General information  Follow your health care provider's instructions to restrict your fluid intake. You may be told to: ? Write down what you drink and any foods you eat that are made mostly from water, such as gelatin and soups. ? Drink from small cups to help control how much you drink.  Take vitamin and mineral supplements only as told by your health care provider.  Take over-the-counter and prescription medicines only as told by your health care provider. ? Your health care provider may recommend an over-the-counter medicine that binds phosphorus, such as an antacid medicine that contains calcium carbonate. What foods can I eat? Fruits Apples. Fresh or frozen berries. Fresh or canned pears, peaches, and pineapple. Grapes. Plums. Vegetables Fresh or  frozen broccoli, carrots, and green beans. Cabbage. Cauliflower. Celery. Cucumbers. Eggplant. Radishes. Zucchini. Grains White bread. White rice. Cooked cereal. Unsalted popcorn. Tortillas. Pasta. Meats and other proteins Fresh or frozen beef, pork, chicken, and fish. Eggs. Dairy Cream cheese. Heavy cream. Ricotta cheese. Beverages Apple cider. Cranberry juice. Grape juice. Lemonade. Black  coffee. Rice milk (that is not enriched or fortified). Condiments Herbs. Spices. Jam and jelly. Honey. Sweets and desserts Sherbet. Cakes. Cookies. Fats and oils Olive oil, canola oil, and safflower oil. Other Non-dairy creamer. Non-dairy whipped topping. Homemade broth without salt. The items listed above may not be a complete list of foods and beverages you can eat. Contact your dietitian for more options. What foods are not recommended? Fruits Star fruit. Bananas. Oranges. Kiwi. Nectarines. Prunes. Melon. Dried fruit. Avocado. Vegetables Potatoes. Beets. Tomatoes. Winter squash and pumpkin. Asparagus. Spinach. Parsnips. Grains Whole-grain bread. Whole-grain pasta. High-fiber cereal. Meats and other proteins Canned, smoked, and cured meats. Soil scientist. Sardines. Nuts and seeds. Peanut butter. Beans and legumes. Dairy Milk. Buttermilk. Yogurt. Cheese and cottage cheese. Processed cheese spreads. Beverages Orange juice. Prune juice. Carbonated soft drinks. Condiments Salt. Salt substitutes. Soy sauce. Sweets and desserts Ice cream. Chocolate. Candied nuts. Fats and oils Butter. Margarine. Other Ready-made frozen meals. Canned soups. The items listed above may not be a complete list of foods and beverages you should avoid. Contact your dietitian for more information. Summary  Dialysis is a procedure that is done when the kidneys have stopped working properly (kidney failure).  If you are undergoing dialysis, it is important to pay careful attention to what you eat. Between dialysis sessions, certain nutrients and wastes can build up in your blood and cause you to get sick.  Your dietitian will help you design an eating plan that is specific to your needs.  Avoid foods that are high in sodium and phosphorus. Restrict fluids as told by your health care provider or dietitian. This information is not intended to replace advice given to you by your health care provider.  Make sure you discuss any questions you have with your health care provider. Document Released: 08/03/2004 Document Revised: 01/23/2018 Document Reviewed: 11/07/2017 Elsevier Patient Education  2020 [employer].

## 2019-10-12 NOTE — Procedures (Signed)
Patient was seen on dialysis and the procedure was supervised.  BFR 400  Via TDC BP is  81/51.   Patient appears to be tolerating treatment well  Eileen Perez 10/12/2019

## 2019-10-12 NOTE — Progress Notes (Addendum)
[facility] Progress Note   Subjective:   Seen on HD today (holiday sched) - 0.5L UF goal and tolerating for now - BP is low side.  Objective Vitals:   10/11/19 0934 10/11/19 1641 10/11/19 2134 10/12/19 0501  BP: (!) 93/55 (!) 99/58 (!) 94/54 (!) 81/48  Pulse: (!) 102 (!) 101 99 98  Resp: 18 18 14 16   Temp: 99.6 F (37.6 C) 98.8 F (37.1 C) 98.9 F (37.2 C) 99.2 F (37.3 C)  TempSrc: Oral Oral Oral Oral  SpO2: 99% 98% 100% 99%  Weight:       Physical Exam General: Frail, elderly woman. Confused, but calm. Heart: RRR; no murmur Lungs: CTA anteriorly Abdomen: soft, non-tender. PD cath in LLQ Extremities: No LE edema Dialysis Access: TDC in R chest - no tenderness  Additional Objective Labs: Basic Metabolic Panel: Recent Labs  Lab 10/08/19 1307 10/10/19 1245 10/11/19 0833 10/12/19 0509  NA 142 140 141 140  139  K 4.3 3.5 3.4* 4.0  4.0  CL 103 101 104 102  102  CO2 26 25 25 25  25   GLUCOSE 132* 102* 203* 109*  114*  BUN 59* 14 43* 73*  74*  CREATININE 6.85* 2.18* 4.31* 5.49*  5.57*  CALCIUM 9.6 7.9* 8.7* 9.0  8.9  PHOS 3.2 1.5*  --  3.7   Liver Function Tests: Recent Labs  Lab 10/08/19 1307 10/10/19 1245 10/12/19 0509  ALBUMIN 1.6* 1.6* 1.4*   CBC: Recent Labs  Lab 10/08/19 0453 10/08/19 1307 10/10/19 1245 10/11/19 0833 10/12/19 0509  WBC 12.8* 13.3* 12.1* 10.1 11.6*  HGB 9.0* 9.1* 9.4* 8.6* 8.4*  HCT 31.5* 32.2* 32.0* 29.9* 29.6*  MCV 103.3* 104.5* 103.6* 106.0* 105.3*  PLT 183 193 127* 124* 122*   Medications: . dialysis solution 1.5% low-MG/low-CA     . allopurinol  300 mg Oral Daily  . aspirin EC  81 mg Oral QHS  . Chlorhexidine Gluconate Cloth  6 each Topical Q0600  . clopidogrel  75 mg Oral Daily  . darbepoetin (ARANESP) injection - DIALYSIS  40 mcg Intravenous Q Mon-HD  . feeding supplement (NEPRO CARB STEADY)  237 mL Oral TID BM  . feeding supplement (PRO-STAT SUGAR FREE 64)  30 mL Oral BID  . insulin aspart   0-9 Units Subcutaneous TID WC  . lidocaine  15 mL Mouth/Throat TID AC  . multivitamin  1 tablet Oral QHS  . omega-3 acid ethyl esters  1 g Oral Daily  . pantoprazole  40 mg Oral BID  . rosuvastatin  5 mg Oral q1800    Dialysis Orders: Prev on PD -> now transitioning to HD  Assessment/Plan: 1.Acute CVA: Per MRI. 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 Will be on DAPT. 2. AMS: Presumed d/t #1, ongoing/chronic. 3. ESRD: Prev on PD x 3 years. Now with ongoing encephalopathy will be unable to perform PD, so transitioned to iHD. Has spot at [facility] transient locally until moves with son to [place]. Will need to eventually get PD cath out - will plan on this as outpatient prior to her moving to [place]. Will be MWF sched - BUT, holiday sched for next week so her days will be Sun, Tues, Fri - HD now. 4. Volume/BP: No edema, minimal gains. Low UF goals. 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 Hgb 8.4 - continue Aranesp weekly. 6. Secondary HPTH: Corr Ca slightly high, Phos  low - no binders or VDRA. Low Ca bath. 7. T2DM: Per primary 8. Recent C.diff: ?resolved 9. Nutrition: Alb very low (in setting of PD and low appetite): Continue Nepro + pro-stat 10. Dispo: Plan is HD inpatient today (Sun), then d/c after - can start HD on Tues (holiday schedule)  Veneta Penton, PA-C 10/12/2019, 9:43 AM  [facility] Pager: [phone]  Patient seen and examined, agree with above note with above modifications. Resting comfortably on HD-  Pleasant dementia-  Planning for discharge today and [facility] as [facility]  Corliss Parish, MD 10/12/2019

## 2019-10-13 ENCOUNTER — Other Ambulatory Visit: Payer: Self-pay

## 2019-10-13 NOTE — Consult Note (Signed)
Follow up:  [facility] Medicare patient  Referral from inpatient TOC for diabetes follow up.  Patient transitioned home.  Was waiting for Palliative Care outcome.  Patient went home with Home Health and family per notes refused SNF.  Natividad Brood, RN BSN [facility] Liaison  [phone] business mobile phone Toll free office [phone]  Fax number: [phone] [email or web address]

## 2019-10-14 ENCOUNTER — Other Ambulatory Visit: Payer: Self-pay | Admitting: *Deleted

## 2019-10-14 ENCOUNTER — Telehealth: Payer: Self-pay | Admitting: Nephrology

## 2019-10-14 DIAGNOSIS — D689 Coagulation defect, unspecified: Secondary | ICD-10-CM | POA: Diagnosis not present

## 2019-10-14 DIAGNOSIS — N186 End stage renal disease: Secondary | ICD-10-CM | POA: Diagnosis not present

## 2019-10-14 DIAGNOSIS — D509 Iron deficiency anemia, unspecified: Secondary | ICD-10-CM | POA: Diagnosis not present

## 2019-10-14 DIAGNOSIS — N2581 Secondary hyperparathyroidism of renal origin: Secondary | ICD-10-CM | POA: Diagnosis not present

## 2019-10-14 DIAGNOSIS — E1129 Type 2 diabetes mellitus with other diabetic kidney complication: Secondary | ICD-10-CM | POA: Diagnosis not present

## 2019-10-14 DIAGNOSIS — Z992 Dependence on renal dialysis: Secondary | ICD-10-CM | POA: Diagnosis not present

## 2019-10-14 DIAGNOSIS — D631 Anemia in chronic kidney disease: Secondary | ICD-10-CM | POA: Diagnosis not present

## 2019-10-14 NOTE — Telephone Encounter (Signed)
Transition of Care Contact from [place]  Date of Discharge: 10/12/2019 Date of Contact: 10/14/2019 Method of contact: phone  Attempted to contact patient to discuss transition of care from inpatient admission due to acute CVA. Patient did not answer the phone.  Message was left on patient's voicemail with instructions to call dialysis center if any questions or concerns and we will follow up with her again soon.   Jen Mow, PA-C [facility] Pager: [phone]

## 2019-10-14 NOTE — Progress Notes (Signed)
Hospital follow up  Assessment and Plan: Hospital visit follow up for:   Eileen Perez was seen today for hospitalization follow-up.  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, some word finding difficulty Will be established with neuro in [place] and starting PT has been coordinated there Continue plavix and ASA for 3 weeks then STOP ASA, CONTINUE plavix indefinitely reviewed with family; information provided for plavix; reminded to present to ED for evaluation with any falls with head injury  Essential hypertension/ Orthostatic hypotension Denies dizziness or near syncopal episodes today  Not currently on BP meds Not checking BPs; she does have cuff Family will assist her with starting to check BPs at home Call provider if <110/60 and dizziness, fatigue, dyspnea, CP   Clostridioides difficile infection/Diarrhea of infectious origin Resolved;   Dehydration Presumed resolved to baseline; had labs and dialysis yesterday at center Defer labs/recommendations to nephrology Appears euvolemic today  Diabetes mellitus due to underlying condition with diabetic nephropathy, without long-term current use of insulin ([place]) Refilled strips today; restart checking daily fasting and PRN glucose Off of all medicatoins with excellent recent control of A1C by dialysis Reviewed principles of hypoglycemia; given handout to review with family in [place] and appropriate hypoglycemia interventions Follow up with new PCP in [place]  ESRD (end stage renal disease) ([place]) Transitioned from peritoneal to hemodialysis over admission No concerns with access sites today Follows with dialysis clinic/nephrology, next appointment 10/17/2019 locally  Emphasized prior to moving she needs to have dialysis coordinated in [place]; dialysis clinic should be able to assist with this. Confirm this prior to departure at upcoming  appointment on Friday.   Anemia of chronic Renal Dz Monitored and managed by nephrology  Protein-calorie malnutrition, severe Baseline weight 150-155 lb, down from 152 lb on 03/25/2019 Continue to emphasize small frequent meals, high calorie, protein intake as recommended by nephrology due to ESRD Continue TID nutritional supplement in between meals  Monitor weight with goal to gain back 10-20 lb Follow up with new PCP in [place]  Hypomagnesemia Managed by nephrology; had labs yesterday with dialysis;   Denies any other needs today. Declines refill needs. Will get established with new PCP in [place] and request records at that time.    All medications were reviewed with patient and family and fully reconciled. All questions answered fully, and patient and family members were encouraged to call the office with any further questions or concerns. Discussed goal to avoid readmission related to this diagnosis.   There are no discontinued medications.  Over 40 minutes of exam, counseling, chart review, and complex, high/moderate level critical decision making was performed this visit.   Future Appointments  Date Time Provider [place]  10/20/2019 10:00 AM Serena Colonel, RN [facility] None  11/24/2019  8:15 AM Hayden Pedro, MD [facility] None  11/26/2019  9:30 AM Baldwin [place], PA-C [facility]  02/16/2020 12:00 PM [facility] 3 [facility] None  02/16/2020 12:30 PM Ladell Pier, MD [facility] None     HPI 78 y.o.female presents for follow up for transition from recent hospitalization or SNIF stay. Admit date to the hospital was 09/30/19, patient was discharged from the hospital on 10/12/19 and our clinical staff contacted the office the day after discharge to set up a follow up appointment. The discharge summary, medications, and diagnostic test results were reviewed before meeting with the patient. The patient was admitted for:   Discharge Diagnoses:  Principal Problem:   Acute encephalopathy Active Problems:   Essential hypertension   Anemia of chronic Renal Dz   ESRD (end stage renal disease) (HCC)   Carcinoid tumor of stomach   Goals of care, counseling/discussion   Cerebral embolism with cerebral infarction   Palliative care by specialist  Admitted From: Home Disposition:  Home with home health, declined SNF. Patient's family arranging for patient to move to [place] to live with her son in the next week or so.  Recommendations for Outpatient Follow-up:  1. Recommend patient to establish with PCP, nephrology, cardiology, neurology in [place] as soon as able.  Discharge Condition: Stable CODE STATUS: Full code Diet recommendation: Renal diet  Brief/Interim Summary: Eileen Perez a78 y.o.year-old who lives alone with ESRD on peritoneal dialysis for many years, gastric carcinoid, gout, DM2presented to ED with confusion on 09/30/2019. She lives alone. Her granddaugther noted confusion over the weekend and her [facility] went to check on her on Tuesday and noted severe confusion. She was also eating poorly this past week.Work uprevealed acute CVA. She was transitioned from peritoneal dialysis to hemodialysis during hospitalization.  Summary 10/15/2019;  She presents accompanied by her granddaughter today; no specific concerns or needs. Does report ongoing weakness following hospitalization. She has initiated hemodialysis locally, had first yesterday, had labs then, will have another visit on Friday then will move to [place] with her son over the weekend. Granddaughter is unsure if she is established with dialysis center in [place]. Patient's son and daughter are working on coordinating this.   Acute CVA -MRIbrain:Multiple small areas of acute infarction bilaterally in different vascular distributions, suggestive of multiple small emboli. -MRA brain:No proximal intracranial vessel occlusion or significant stenosis. -Echo: EF  60-65%, grade 1 dd  -Carotis [place]: 1-39% stenosis bilaterally  -DVT US: negative bilaterally  -Neurology consulted, stroke work-upcompleted  -Telemetry shows no A. fib, 2D echo without intracardiac thrombus. -EPrecommended30 day event monitoring  -Currently she is on aspirin and Plavixfor 3 weeks, then plavix alone indefinitely  - Continue crestor  -Neurology signed off 11/19; follow up with stroke clinic in 4 weeks or establish with neurology when patient moves to [place]  She reports ongoing word finding difficulty and expressive difficulty, generalized weakness but no other specifc limitations following. Will be working with PT in [place]. Currently taking ASA and plavix per instructions.   Acute metabolic encephalopathy -This is likely secondary to acute CVA -B12, folate and TSH WNL.   ESRD (end stage renal disease) on peritoneal dialysis since 2017 -Now on hemodialysis  -Nephrology following  Diarrhea - Recent C diff colitis -Diarrhea improved, resolved per discharge conference, no further diarrhea   Anemia of chronic disease -Stable - following with nephrology, just started hemodialysis, will repeat on Friday, getting labs there Lab Results  Component Value Date   WBC 11.6 (H) 10/12/2019   HGB 8.4 (L) 10/12/2019   HCT 29.6 (L) 10/12/2019   MCV 105.3 (H) 10/12/2019   PLT 122 (L) 10/12/2019   Gout -Continueallopurinol  Severe protein calorie malnutrition -Per dietitian  BMI is Body mass index is 20.52 kg/m., she has been working on diet, still poor appetite but trying to eat regular small meals. Family is assisting. Taking nutritional supplement TID between meals. Weight down from 152 lb on 03/25/2019. She reports generalized weakness and fatigue. Using walker.  Wt Readings from Last 3 Encounters:  10/15/19 108 lb 9.6 oz (49.3 kg)  10/12/19 131 lb 13.4 oz (59.8 kg)  09/18/19 109 lb 3.2 oz (49.5 kg)  DM2 -Well controlled Lab Results  Component Value Date    HGBA1C 5.0 10/08/2019   She has not been checking at home, did send in new strips to pick up today and will restart.   She denies blurry vision, dizziness, irritability, sweats, shakes or other symptoms of hypoglycemia.   HLD -Continue crestor  The cholesterol last visit was:   Lab Results  Component Value Date   CHOL 77 10/08/2019   HDL 29 (L) 10/08/2019   LDLCALC 20 10/08/2019   TRIG 141 10/08/2019   CHOLHDL 2.7 10/08/2019   She does have BP cuff at home but admits hasn't been checking, today their BP is BP: 112/62 She denies chest pain, shortness of breath, dizziness.   Home health is involved, will be starting PT, has been set up to be evalauated once she moves    Images while in the hospital: Ct Head Wo Contrast  Result Date: 09/30/2019 CLINICAL DATA:  Altered level of consciousness. EXAM: CT HEAD WITHOUT CONTRAST TECHNIQUE: Contiguous axial images were obtained from the base of the skull through the vertex without intravenous contrast. COMPARISON:  September 10, 2015. FINDINGS: Brain: Mild diffuse cortical atrophy is noted. Mild chronic ischemic white matter disease is noted. No mass effect or midline shift is noted. Ventricular size is within normal limits. There is no evidence of mass lesion, hemorrhage or acute infarction. Vascular: No hyperdense vessel or unexpected calcification. Skull: Normal. Negative for fracture or focal lesion. Sinuses/Orbits: Stable left maxillary mucous retention cyst is noted. Other: None. IMPRESSION: Mild diffuse cortical atrophy. Mild chronic ischemic white matter disease. No acute intracranial abnormality seen. Electronically Signed   By: Marijo Conception M.D.   On: 09/30/2019 15:12   Dg Chest Port 1 View  Result Date: 09/30/2019 CLINICAL DATA:  Altered EXAM: PORTABLE CHEST 1 VIEW COMPARISON:  Chest radiograph 09/02/2019 FINDINGS: Heart size within normal limits.  Aortic atherosclerosis. There is no focal consolidation within the lungs. No  evidence of pleural effusion or pneumothorax. No acute bony abnormality. Overlying monitoring leads. IMPRESSION: No evidence of acute cardiopulmonary disease. Aortic atherosclerosis. Electronically Signed   By: Kellie Simmering DO   On: 09/30/2019 15:38     Current Outpatient Medications (Endocrine & Metabolic):  .  cinacalcet (SENSIPAR) 30 MG tablet, NEW PRESCRIPTION REQUEST: TAKE ONE TABLET BY MOUTH DAILY  Current Outpatient Medications (Cardiovascular):  .  rosuvastatin (CRESTOR) 5 MG tablet, Take 1 tablet after supper for cholesterol. (Patient taking differently: Take 5 mg by mouth daily after supper. Take 1 tablet after supper for cholesterol.)   Current Outpatient Medications (Analgesics):  .  allopurinol (ZYLOPRIM) 300 MG tablet, Take 1 tablet daily for gout .  aspirin 81 MG tablet, Take 1 tablet (81 mg total) by mouth at bedtime for 21 days.  Current Outpatient Medications (Hematological):  .  clopidogrel (PLAVIX) 75 MG tablet, Take 1 tablet (75 mg total) by mouth daily.  Current Outpatient Medications (Other):  .  b complex-vitamin c-folic acid (NEPHRO-VITE) 0.8 MG TABS tablet, Take 1 tablet by mouth daily.  .  Blood Glucose Monitoring Suppl DEVI, Check blood glucose before meals, at bedtime and as needed. Marland Kitchen  gentamicin cream (GARAMYCIN) 0.1 %, Apply 1 application topically daily. (Patient taking differently: Apply 1 application topically every evening. ) .  glucose blood test strip, Use as instructed .  Lancets (ACCU-CHEK SOFT TOUCH) lancets, Use as instructed .  Nutritional Supplements (FEEDING SUPPLEMENT, NEPRO CARB STEADY,) LIQD, Take 237 mLs by mouth 3 (three) times daily  between meals. .  ondansetron (ZOFRAN-ODT) 4 MG disintegrating tablet, Take 1 tablet every 8 hours as needed for nausea and vomiting. .  pantoprazole (PROTONIX) 40 MG tablet, Takes 1 tablet twice a day for reflux. (Patient taking differently: Take 40 mg by mouth 2 (two) times daily. ) .  sevelamer carbonate  (RENVELA) 800 MG tablet, Take 1 tablet (800 mg total) by mouth 3 (three) times daily with meals. .  sucralfate (CARAFATE) 1 GM/10ML suspension, NEW PRESCRIPTION REQUEST: TAKE 10 MLS BY MOUTH FOUR TIMES DAILY .  ZINC GLUCONATE PO, Take 40 mg by mouth daily.   Past Medical History:  Diagnosis Date  . Anemia   . Arthritis    Osteoarthritis  . Chronic kidney disease    Chronic renal insufficiency, peritoneal dialysis qday sees dr Moshe Cipro  . Diabetes mellitus    Pre  . Diverticulosis   . GERD (gastroesophageal reflux disease)   . Gout   . Hiatal hernia   . History of blood transfusion   . Hyperlipidemia   . Hypertension   . Osteopenia   . Pancreatic cyst 1999  . Thyroid disease    Hyperparathyroid      Allergies  Allergen Reactions  . Ace Inhibitors Other (See Comments)    unknown  . Nsaids Other (See Comments)    unknown    ROS: all negative except above.   Physical Exam: Filed Weights   10/15/19 1537  Weight: 108 lb 9.6 oz (49.3 kg)   BP 112/62   Pulse 100   Temp (!) 96.9 F (36.1 C)   Resp 16   Wt 108 lb 9.6 oz (49.3 kg)   BMI 20.52 kg/m  General Appearance: Well dressed AA female, frail elder, in no apparent distress. Eyes: PERRLA, EOMs, conjunctiva no swelling or erythema ENT/Mouth: Oral exam declined. Hearing normal.  Neck: Supple, thyroid normal.  Respiratory: Respiratory effort normal, BS equal bilaterally without rales, rhonchi, wheezing or stridor.  Cardio: Heart sounds quiet, RRR with no audible MRGs. Intact peripheral pulses without edema.  Abdomen: Soft, + BS.  Non tender, no guarding, rebound, hernias, masses. Lymphatics: Non tender without lymphadenopathy.  Musculoskeletal: Full ROM, 4/5 strength upper and lower extremities, slow steady gait with walker Skin: Warm, dry without rashes, lesions, ecchymosis. She has port site in place R chest with intact dressing, no discharge; port site in place abdomen with intact dressing, no discharge.   Neuro: Cranial nerves intact with limited exam due to mask in place. Normal muscle tone,  Sensation intact. Some slow cognition, word finding difficulty Psych: Awake and oriented X 3, normal affect, Insight and Judgment appropriate.     Izora Ribas, NP 5:40 PM [facility]

## 2019-10-14 NOTE — Patient Outreach (Addendum)
[facility]) Care Management  10/14/2019  Eileen Perez [birth year] [id number]   Subjective: Case discussed with referral source Eileen Perez). This RNCM will follow up on referral and attempt to  verify patient's hospital  discharge disposition.  Telephone call to patient's home number, no answer, left HIPAA compliant voicemail message, and requested call back.     Objective: Per KPN (Knowledge Performance Now, point of care tool) and chart review, patient hospitalized 09/30/2019 - 10/12/2019 for Acute metabolic encephalopathy secondary acute CVA.   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  Patient has designated party release in chart for brother Eileen Perez) and [name] designated agents are:  Eileen Perez, Eileen Perez, and Eileen Perez.      Assessment: Received [facility] Liaison Referral on 10/13/2019.  Referral source: Eileen Perez.    Referral reason:  Please assign to [facility] nurse for complex care and disease management       for diabetes follow up calls and assess for further needs. Patient       and  family had declined palliative care. Home with [facility]   Transition of care follow up pending patient contact.     Plan: RNCM will send unsuccessful outreach letter, [facility] pamphlet, will call patient for 2nd telephone outreach attempt within 4 business days, transition of care follow up, and proceed with case closure, within 10 business days if no return call.     Tyjah Hai H. Annia Friendly, BSN, [place] [facility] Telephonic CM Phone: [phone] Fax: [phone]

## 2019-10-15 ENCOUNTER — Other Ambulatory Visit: Payer: Self-pay

## 2019-10-15 ENCOUNTER — Encounter: Payer: Self-pay | Admitting: Adult Health

## 2019-10-15 ENCOUNTER — Ambulatory Visit (INDEPENDENT_AMBULATORY_CARE_PROVIDER_SITE_OTHER): Payer: Medicare Other | Admitting: Adult Health

## 2019-10-15 VITALS — BP 112/62 | HR 100 | Temp 96.9°F | Resp 16 | Wt 108.6 lb

## 2019-10-15 DIAGNOSIS — E0821 Diabetes mellitus due to underlying condition with diabetic nephropathy: Secondary | ICD-10-CM

## 2019-10-15 DIAGNOSIS — E860 Dehydration: Secondary | ICD-10-CM

## 2019-10-15 DIAGNOSIS — I10 Essential (primary) hypertension: Secondary | ICD-10-CM | POA: Diagnosis not present

## 2019-10-15 DIAGNOSIS — A498 Other bacterial infections of unspecified site: Secondary | ICD-10-CM | POA: Diagnosis not present

## 2019-10-15 DIAGNOSIS — E43 Unspecified severe protein-calorie malnutrition: Secondary | ICD-10-CM

## 2019-10-15 DIAGNOSIS — K2970 Gastritis, unspecified, without bleeding: Secondary | ICD-10-CM | POA: Diagnosis not present

## 2019-10-15 DIAGNOSIS — I120 Hypertensive chronic kidney disease with stage 5 chronic kidney disease or end stage renal disease: Secondary | ICD-10-CM | POA: Diagnosis not present

## 2019-10-15 DIAGNOSIS — N186 End stage renal disease: Secondary | ICD-10-CM | POA: Diagnosis not present

## 2019-10-15 DIAGNOSIS — K2100 Gastro-esophageal reflux disease with esophagitis, without bleeding: Secondary | ICD-10-CM | POA: Diagnosis not present

## 2019-10-15 DIAGNOSIS — G9340 Encephalopathy, unspecified: Secondary | ICD-10-CM

## 2019-10-15 DIAGNOSIS — I951 Orthostatic hypotension: Secondary | ICD-10-CM | POA: Diagnosis not present

## 2019-10-15 DIAGNOSIS — D638 Anemia in other chronic diseases classified elsewhere: Secondary | ICD-10-CM

## 2019-10-15 DIAGNOSIS — A09 Infectious gastroenteritis and colitis, unspecified: Secondary | ICD-10-CM

## 2019-10-15 DIAGNOSIS — E1122 Type 2 diabetes mellitus with diabetic chronic kidney disease: Secondary | ICD-10-CM | POA: Diagnosis not present

## 2019-10-15 DIAGNOSIS — Z8673 Personal history of transient ischemic attack (TIA), and cerebral infarction without residual deficits: Secondary | ICD-10-CM | POA: Diagnosis not present

## 2019-10-15 DIAGNOSIS — Z538 Procedure and treatment not carried out for other reasons: Secondary | ICD-10-CM | POA: Diagnosis not present

## 2019-10-15 NOTE — Patient Instructions (Addendum)
Continue aspirin and plavix until 12/13th for 3 weeks - then STOP aspirin and continue plavix indefinitely    Please start checking fasting glucose - low sugar is more dangerous than high sugar   Please check blood pressure daily - ideal is 100/60-140/90  Want to avoid blood pressure getting too low     Clopidogrel tablets What is this medicine? CLOPIDOGREL (kloh PID oh grel) helps to prevent blood clots. This medicine is used to prevent heart attack, stroke, or other vascular events in people who are at high risk. This medicine may be used for other purposes; ask your health care provider or pharmacist if you have questions. COMMON BRAND NAME(S): Plavix What should I tell my health care provider before I take this medicine? They need to know if you have any of the following conditions:  bleeding disorders  bleeding in the brain  having surgery  history of stomach bleeding  an unusual or allergic reaction to clopidogrel, other medicines, foods, dyes, or preservatives  pregnant or trying to get pregnant  breast-feeding How should I use this medicine? Take this medicine by mouth with a glass of water. Follow the directions on the prescription label. You may take this medicine with or without food. If it upsets your stomach, take it with food. Take your medicine at regular intervals. Do not take it more often than directed. Do not stop taking except on your doctor's advice. A special MedGuide will be given to you by the pharmacist with each prescription and refill. Be sure to read this information carefully each time. Talk to your pediatrician regarding the use of this medicine in children. Special care may be needed. Overdosage: If you think you have taken too much of this medicine contact a poison control center or emergency room at once. NOTE: This medicine is only for you. Do not share this medicine with others. What if I miss a dose? If you miss a dose, take it as soon as  you can. If it is almost time for your next dose, take only that dose. Do not take double or extra doses. What may interact with this medicine? Do not take this medicine with the following medications:  dasabuvir; ombitasvir; paritaprevir; ritonavir  defibrotide  selexipag This medicine may also interact with the following medications:  certain medicines that treat or prevent blood clots like warfarin  narcotic medicines for pain  NSAIDs, medicines for pain and inflammation, like ibuprofen or naproxen  repaglinide  SNRIs, medicines for depression, like desvenlafaxine, duloxetine, levomilnacipran, venlafaxine  SSRIs, medicines for depression, like citalopram, escitalopram, fluoxetine, fluvoxamine, paroxetine, sertraline  stomach acid blockers like cimetidine, esomeprazole, omeprazole This list may not describe all possible interactions. Give your health care provider a list of all the medicines, herbs, non-prescription drugs, or dietary supplements you use. Also tell them if you smoke, drink alcohol, or use illegal drugs. Some items may interact with your medicine. What should I watch for while using this medicine? Visit your doctor or health care professional for regular check-ups. Do not stop taking your medicine unless your doctor tells you to. Notify your doctor or health care professional and seek emergency treatment if you develop breathing problems; changes in vision; chest pain; severe, sudden headache; pain, swelling, warmth in the leg; trouble speaking; sudden numbness or weakness of the face, arm or leg. These can be signs that your condition has gotten worse. If you are going to have surgery or dental work, tell your doctor or health care  professional that you are taking this medicine. Certain genetic factors may reduce the effect of this medicine. Your doctor may use genetic tests to determine treatment. Only take aspirin if you are instructed to. Low doses of aspirin are  used with this medicine to treat some conditions. Taking aspirin with this medicine can increase your risk of bleeding so you must be careful. Talk to your doctor or pharmacist if you have questions. What side effects may I notice from receiving this medicine? Side effects that you should report to your doctor or health care professional as soon as possible:  allergic reactions like skin rash, itching or hives, swelling of the face, lips, or tongue  signs and symptoms of bleeding such as bloody or black, tarry stools; red or dark-brown urine; spitting up blood or brown material that looks like coffee grounds; red spots on the skin; unusual bruising or bleeding from the eye, gums, or nose  signs and symptoms of a blood clot such as breathing problems; changes in vision; chest pain; severe, sudden headache; pain, swelling, warmth in the leg; trouble speaking; sudden numbness or weakness of the face, arm or leg  signs and symptoms of low blood sugar such as feeling anxious; confusion; dizziness; increased hunger; unusually weak or tired; increased sweating; shakiness; cold, clammy skin; irritable; headache; blurred vision; fast heartbeat; loss of consciousness Side effects that usually do not require medical attention (report to your doctor or health care professional if they continue or are bothersome):  constipation  diarrhea  headache  upset stomach This list may not describe all possible side effects. Call your doctor for medical advice about side effects. You may report side effects to FDA at 1-800-FDA-1088. Where should I keep my medicine? Keep out of the reach of children. Store at room temperature of 59 to 86 degrees F (15 to 30 degrees C). Throw away any unused medicine after the expiration date. NOTE: This sheet is a summary. It may not cover all possible information. If you have questions about this medicine, talk to your doctor, pharmacist, or health care provider.  2020  Elsevier/Gold Standard (2018-04-08 15:03:38)     Blood Glucose Monitoring, Adult Monitoring your blood sugar (glucose) is an important part of managing your diabetes (diabetes mellitus). Blood glucose monitoring involves checking your blood glucose as often as directed and keeping a record (log) of your results over time. Checking your blood glucose regularly and keeping a blood glucose log can:  Help you and your health care provider adjust your diabetes management plan as needed, including your medicines or insulin.  Help you understand how food, exercise, illnesses, and medicines affect your blood glucose.  Let you know what your blood glucose is at any time. You can quickly find out if you have low blood glucose (hypoglycemia) or high blood glucose (hyperglycemia). Your health care provider will set individualized treatment goals for you. Your goals will be based on your age, other medical conditions you have, and how you respond to diabetes treatment. Generally, the goal of treatment is to maintain the following blood glucose levels:  Before meals (preprandial): 80-130 mg/dL (4.4-7.2 mmol/L).  After meals (postprandial): below 180 mg/dL (10 mmol/L).  A1c level: less than 7%. Supplies needed:  Blood glucose meter.  Test strips for your meter. Each meter has its own strips. You must use the strips that came with your meter.  A needle to prick your finger (lancet). Do not use a lancet more than one time.  A device  that holds the lancet (lancing device).  A journal or log book to write down your results. How to check your blood glucose  1. Wash your hands with soap and water. 2. Prick the side of your finger (not the tip) with the lancet. Use a different finger each time. 3. Gently rub the finger until a small drop of blood appears. 4. Follow instructions that come with your meter for inserting the test strip, applying blood to the strip, and using your blood glucose  meter. 5. Write down your result and any notes. Some meters allow you to use areas of your body other than your finger (alternative sites) to test your blood. The most common alternative sites are:  Forearm.  Thigh.  Palm of the hand. If you think you may have hypoglycemia, or if you have a history of not knowing when your blood glucose is getting low (hypoglycemia unawareness), do not use alternative sites. Use your finger instead. Alternative sites may not be as accurate as the fingers, because blood flow is slower in these areas. This means that the result you get may be delayed, and it may be different from the result that you would get from your finger. Follow these instructions at home: Blood glucose log   Every time you check your blood glucose, write down your result. Also write down any notes about things that may be affecting your blood glucose, such as your diet and exercise for the day. This information can help you and your health care provider: ? Look for patterns in your blood glucose over time. ? Adjust your diabetes management plan as needed.  Check if your meter allows you to download your records to a computer. Most glucose meters store a record of glucose readings in the meter. If you have type 1 diabetes:  Check your blood glucose 2 or more times a day.  Also check your blood glucose: ? Before every insulin injection. ? Before and after exercise. ? Before meals. ? 2 hours after a meal. ? Occasionally between 2:00 a.m. and 3:00 a.m., as directed. ? Before potentially dangerous tasks, like driving or using heavy machinery. ? At bedtime.  You may need to check your blood glucose more often, up to 6-10 times a day, if you: ? Use an insulin pump. ? Need multiple daily injections (MDI). ? Have diabetes that is not well-controlled. ? Are ill. ? Have a history of severe hypoglycemia. ? Have hypoglycemia unawareness. If you have type 2 diabetes:  If you take  insulin or other diabetes medicines, check your blood glucose 2 or more times a day.  If you are on intensive insulin therapy, check your blood glucose 4 or more times a day. Occasionally, you may also need to check between 2:00 a.m. and 3:00 a.m., as directed.  Also check your blood glucose: ? Before and after exercise. ? Before potentially dangerous tasks, like driving or using heavy machinery.  You may need to check your blood glucose more often if: ? Your medicine is being adjusted. ? Your diabetes is not well-controlled. ? You are ill. General tips  Always keep your supplies with you.  If you have questions or need help, all blood glucose meters have a 24-hour "hotline" phone number that you can call. You may also contact your health care provider.  After you use a few boxes of test strips, adjust (calibrate) your blood glucose meter by following instructions that came with your meter. Contact a health care provider  if:  Your blood glucose is at or above 240 mg/dL (13.3 mmol/L) for 2 days in a row.  You have been sick or have had a fever for 2 days or longer, and you are not getting better.  You have any of the following problems for more than 6 hours: ? You cannot eat or drink. ? You have nausea or vomiting. ? You have diarrhea. Get help right away if:  Your blood glucose is lower than 54 mg/dL (3 mmol/L).  You become confused or you have trouble thinking clearly.  You have difficulty breathing.  You have moderate or large ketone levels in your urine. Summary  Monitoring your blood sugar (glucose) is an important part of managing your diabetes (diabetes mellitus).  Blood glucose monitoring involves checking your blood glucose as often as directed and keeping a record (log) of your results over time.  Your health care provider will set individualized treatment goals for you. Your goals will be based on your age, other medical conditions you have, and how you respond  to diabetes treatment.  Every time you check your blood glucose, write down your result. Also write down any notes about things that may be affecting your blood glucose, such as your diet and exercise for the day. This information is not intended to replace advice given to you by your health care provider. Make sure you discuss any questions you have with your health care provider. Document Released: 11/09/2003 Document Revised: 08/30/2018 Document Reviewed: 04/17/2016 Elsevier Patient Education  2020 [place].     Hypoglycemia Hypoglycemia is when the sugar (glucose) level in your blood is too low. Signs of low blood sugar may include:  Feeling: ? Hungry. ? Worried or nervous (anxious). ? Sweaty and clammy. ? Confused. ? Dizzy. ? Sleepy. ? Sick to your stomach (nauseous).  Having: ? A fast heartbeat. ? A headache. ? A change in your vision. ? Tingling or no feeling (numbness) around your mouth, lips, or tongue. ? Jerky movements that you cannot control (seizure).  Having trouble with: ? Moving (coordination). ? Sleeping. ? Passing out (fainting). ? Getting upset easily (irritability). Low blood sugar can happen to people who have diabetes and people who do not have diabetes. Low blood sugar can happen quickly, and it can be an emergency. Treating low blood sugar Low blood sugar is often treated by eating or drinking something sugary right away, such as:  Fruit juice, 4-6 oz (120-150 mL).  Regular soda (not diet soda), 4-6 oz (120-150 mL).  Low-fat milk, 4 oz (120 mL).  Several pieces of hard candy.  Sugar or honey, 1 Tbsp (15 mL). Treating low blood sugar if you have diabetes If you can think clearly and swallow safely, follow the 15:15 rule:  Take 15 grams of a fast-acting carb (carbohydrate). Talk with your doctor about how much you should take.  Always keep a source of fast-acting carb with you, such as: ? Sugar tablets (glucose pills). Take 3-4  pills. ? 6-8 pieces of hard candy. ? 4-6 oz (120-150 mL) of fruit juice. ? 4-6 oz (120-150 mL) of regular (not diet) soda. ? 1 Tbsp (15 mL) honey or sugar.  Check your blood sugar 15 minutes after you take the carb.  If your blood sugar is still at or below 70 mg/dL (3.9 mmol/L), take 15 grams of a carb again.  If your blood sugar does not go above 70 mg/dL (3.9 mmol/L) after 3 tries, get help right away.  After your blood sugar goes back to normal, eat a meal or a snack within 1 hour.  Treating very low blood sugar If your blood sugar is at or below 54 mg/dL (3 mmol/L), you have very low blood sugar (severe hypoglycemia). This may also cause:  Passing out.  Jerky movements you cannot control (seizure).  Losing consciousness (coma). This is an emergency. Do not wait to see if the symptoms will go away. Get medical help right away. Call your local emergency services (911 in the [place] Do not drive yourself to the hospital. If you have very low blood sugar and you cannot eat or drink, you may need a glucagon shot (injection). A family member or friend should learn how to check your blood sugar and how to give you a glucagon shot. Ask your doctor if you need to have a glucagon shot kit at home. Follow these instructions at home: General instructions  Take over-the-counter and prescription medicines only as told by your doctor.  Stay aware of your blood sugar as told by your doctor.  Limit alcohol intake to no more than 1 drink a day for nonpregnant women and 2 drinks a day for men. One drink equals 12 oz of beer (355 mL), 5 oz of wine (148 mL), or 1 oz of hard liquor (44 mL).  Keep all follow-up visits as told by your doctor. This is important. If you have diabetes:   Follow your diabetes care plan as told by your doctor. Make sure you: ? Know the signs of low blood sugar. ? Take your medicines as told. ? Follow your exercise and meal plan. ? Eat on time. Do not skip  meals. ? Check your blood sugar as often as told by your doctor. Always check it before and after exercise. ? Follow your sick day plan when you cannot eat or drink normally. Make this plan ahead of time with your doctor.  Share your diabetes care plan with: ? Your work or school. ? People you live with.  Check your pee (urine) for ketones: ? When you are sick. ? As told by your doctor.  Carry a card or wear jewelry that says you have diabetes. Contact a doctor if:  You have trouble keeping your blood sugar in your target range.  You have low blood sugar often. Get help right away if:  You still have symptoms after you eat or drink something sugary.  Your blood sugar is at or below 54 mg/dL (3 mmol/L).  You have jerky movements that you cannot control.  You pass out. These symptoms may be an emergency. Do not wait to see if the symptoms will go away. Get medical help right away. Call your local emergency services (911 in the [place] Do not drive yourself to the hospital. Summary  Hypoglycemia happens when the level of sugar (glucose) in your blood is too low.  Low blood sugar can happen to people who have diabetes and people who do not have diabetes. Low blood sugar can happen quickly, and it can be an emergency.  Make sure you know the signs of low blood sugar and know how to treat it.  Always keep a source of sugar (fast-acting carb) with you to treat low blood sugar. This information is not intended to replace advice given to you by your health care provider. Make sure you discuss any questions you have with your health care provider. Document Released: 01/31/2010 Document Revised: 02/27/2019 Document Reviewed: 12/10/2015 Elsevier Patient  Education  [employer].

## 2019-10-17 DIAGNOSIS — D509 Iron deficiency anemia, unspecified: Secondary | ICD-10-CM | POA: Diagnosis not present

## 2019-10-17 DIAGNOSIS — N2581 Secondary hyperparathyroidism of renal origin: Secondary | ICD-10-CM | POA: Diagnosis not present

## 2019-10-17 DIAGNOSIS — Z992 Dependence on renal dialysis: Secondary | ICD-10-CM | POA: Diagnosis not present

## 2019-10-17 DIAGNOSIS — N186 End stage renal disease: Secondary | ICD-10-CM | POA: Diagnosis not present

## 2019-10-17 DIAGNOSIS — E1129 Type 2 diabetes mellitus with other diabetic kidney complication: Secondary | ICD-10-CM | POA: Diagnosis not present

## 2019-10-17 DIAGNOSIS — D689 Coagulation defect, unspecified: Secondary | ICD-10-CM | POA: Diagnosis not present

## 2019-10-17 DIAGNOSIS — I679 Cerebrovascular disease, unspecified: Secondary | ICD-10-CM | POA: Diagnosis not present

## 2019-10-17 DIAGNOSIS — D631 Anemia in chronic kidney disease: Secondary | ICD-10-CM | POA: Diagnosis not present

## 2019-10-20 ENCOUNTER — Other Ambulatory Visit: Payer: Self-pay | Admitting: *Deleted

## 2019-10-20 DIAGNOSIS — Z992 Dependence on renal dialysis: Secondary | ICD-10-CM | POA: Diagnosis not present

## 2019-10-20 DIAGNOSIS — Z8673 Personal history of transient ischemic attack (TIA), and cerebral infarction without residual deficits: Secondary | ICD-10-CM | POA: Diagnosis not present

## 2019-10-20 DIAGNOSIS — D3A00 Benign carcinoid tumor of unspecified site: Secondary | ICD-10-CM | POA: Diagnosis not present

## 2019-10-20 DIAGNOSIS — N186 End stage renal disease: Secondary | ICD-10-CM | POA: Diagnosis not present

## 2019-10-20 DIAGNOSIS — E1122 Type 2 diabetes mellitus with diabetic chronic kidney disease: Secondary | ICD-10-CM | POA: Diagnosis not present

## 2019-10-20 DIAGNOSIS — D631 Anemia in chronic kidney disease: Secondary | ICD-10-CM | POA: Diagnosis not present

## 2019-10-20 DIAGNOSIS — D509 Iron deficiency anemia, unspecified: Secondary | ICD-10-CM | POA: Diagnosis not present

## 2019-10-20 DIAGNOSIS — E1129 Type 2 diabetes mellitus with other diabetic kidney complication: Secondary | ICD-10-CM | POA: Diagnosis not present

## 2019-10-20 DIAGNOSIS — N2581 Secondary hyperparathyroidism of renal origin: Secondary | ICD-10-CM | POA: Diagnosis not present

## 2019-10-20 DIAGNOSIS — D689 Coagulation defect, unspecified: Secondary | ICD-10-CM | POA: Diagnosis not present

## 2019-10-20 NOTE — Patient Outreach (Addendum)
[facility]) Care Management  10/20/2019  Eileen Perez May 05, 1941 [id number]   Subjective: Telephone call to patient's home number, spoke with female answering the phone, states Eileen Perez is currently at dialysis, is unavailable, and will be moving on 10/21/2019.  Advised call recipient, unable to discuss nature of the call without Eileen Perez's consent, recipient voices understanding, left HIPAA message for Eileen Perez, and requested call back.     Objective: Per KPN (Knowledge Performance Now, point of care tool) and chart review, patient hospitalized 09/30/2019 - 10/12/2019 for Acute metabolic encephalopathy secondary acute CVA.   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 Patient has designated party release in chart for brother Eileen Perez) and [employer] designated agents are:  Eileen Perez, Eileen Perez, and Eileen Perez.      Assessment: Received [facility] Liaison Referral on 10/13/2019.  Referral source: Natividad Brood.    Referral reason:  Please assign to [facility] nurse for complex care and disease management       for diabetes follow up calls and assess for further needs. Patient       and  family had declined palliative care. Home with [facility]   Per chart review, patient had hospital follow up visit with primary provider on 10/15/2019 and patient is planning to move to [place] to live with son in the next week or so.  Transition of care follow up pending patient contact.     Plan: RNCM has sent unsuccessful outreach letter, [facility] pamphlet, will call patient for 3rd telephone outreach attempt  within 4 business days, transition of care follow up, and proceed with case closure, within 10 business days if no return call.    Buryl Bamber H. Annia Friendly, BSN, [place] Management [facility] Telephonic CM Phone: [phone] Fax: [phone]

## 2019-10-21 DIAGNOSIS — N186 End stage renal disease: Secondary | ICD-10-CM | POA: Diagnosis not present

## 2019-10-21 DIAGNOSIS — Z992 Dependence on renal dialysis: Secondary | ICD-10-CM | POA: Diagnosis not present

## 2019-10-22 ENCOUNTER — Other Ambulatory Visit: Payer: Self-pay | Admitting: *Deleted

## 2019-10-22 ENCOUNTER — Ambulatory Visit: Payer: Medicare Other

## 2019-10-22 DIAGNOSIS — Z992 Dependence on renal dialysis: Secondary | ICD-10-CM | POA: Diagnosis not present

## 2019-10-22 DIAGNOSIS — N2581 Secondary hyperparathyroidism of renal origin: Secondary | ICD-10-CM | POA: Diagnosis not present

## 2019-10-22 DIAGNOSIS — E1122 Type 2 diabetes mellitus with diabetic chronic kidney disease: Secondary | ICD-10-CM | POA: Diagnosis not present

## 2019-10-22 DIAGNOSIS — N186 End stage renal disease: Secondary | ICD-10-CM | POA: Diagnosis not present

## 2019-10-22 DIAGNOSIS — D631 Anemia in chronic kidney disease: Secondary | ICD-10-CM | POA: Diagnosis not present

## 2019-10-22 NOTE — Patient Outreach (Signed)
[place] [facility]) Care Management  10/22/2019  Eileen Perez May 31, 1941 [id number]   Subjective:  Telephone call to patient's home number, no answer, left HIPAA compliant voicemail message, and requested call back. Telephone call to patient's mobile number, no answer, left HIPAA compliant voicemail message, and requested call back.    Objective:Per KPN (Knowledge Performance Now, point of care tool) and chart review,patient hospitalized 09/30/2019 - 75/17/0017 forAcute metabolic encephalopathysecondary acute CVA. 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 Patient has designated party release in chart for brother Eileen Perez) and [place] designated agents are: Eileen Perez, Eileen Perez, and Eileen Perez.      Assessment: Received [facility] Liaison Referral on 10/13/2019. Referral source: Natividad Brood.  Referral reason: Please assign to [facility] nurse for complex care and disease management       for diabetes follow up calls and assess for further needs. Patient       and  family had declined palliative care. Home with [facility]  Per chart review, patient had hospital follow up visit with primary provider on 10/15/2019 and patient is planning to move to [place] to live with son in the next week or so.  Transition of care follow up pending patient contact.    Plan:RNCM has sent unsuccessful outreach letter, [facility] pamphlet, and will proceed with case closure, within 10 business days if no return call.    Colisha Redler H. Annia Friendly, BSN, [place] Management [facility] Telephonic CM Phone:  [phone] Fax: [phone]

## 2019-10-23 ENCOUNTER — Telehealth: Payer: Self-pay | Admitting: *Deleted

## 2019-10-23 NOTE — Telephone Encounter (Signed)
[employer] 30 day cardiac event monitor to be shipped to Eileen Perez. Eileen Perez, [place] [address].  Patient just moved to live with son Eileen Perez, [phone].  Instructions reviewed with daughter, Eileen Perez [phone].  Dr. Stanford Breed to read.  Patient is not set up with new cardiologist or neurologist yet.  Patients daughter to sign up with MyChart to access test results.

## 2019-10-23 NOTE — Telephone Encounter (Signed)
Family has moved patient to [place] and are getting her set up with a new oncologist. Requesting fax # to send a release to request records be sent to the new oncology practice. Provided her with [phone]. Suggested she have the new oncologist send the request for records. She also canceled her March appointment here.

## 2019-10-24 DIAGNOSIS — N186 End stage renal disease: Secondary | ICD-10-CM | POA: Diagnosis not present

## 2019-10-24 DIAGNOSIS — Z992 Dependence on renal dialysis: Secondary | ICD-10-CM | POA: Diagnosis not present

## 2019-10-24 DIAGNOSIS — N2581 Secondary hyperparathyroidism of renal origin: Secondary | ICD-10-CM | POA: Diagnosis not present

## 2019-10-24 DIAGNOSIS — D631 Anemia in chronic kidney disease: Secondary | ICD-10-CM | POA: Diagnosis not present

## 2019-10-24 DIAGNOSIS — E1122 Type 2 diabetes mellitus with diabetic chronic kidney disease: Secondary | ICD-10-CM | POA: Diagnosis not present

## 2019-10-27 ENCOUNTER — Other Ambulatory Visit: Payer: Self-pay | Admitting: *Deleted

## 2019-10-27 DIAGNOSIS — E1122 Type 2 diabetes mellitus with diabetic chronic kidney disease: Secondary | ICD-10-CM | POA: Diagnosis not present

## 2019-10-27 DIAGNOSIS — N186 End stage renal disease: Secondary | ICD-10-CM | POA: Diagnosis not present

## 2019-10-27 DIAGNOSIS — Z992 Dependence on renal dialysis: Secondary | ICD-10-CM | POA: Diagnosis not present

## 2019-10-27 DIAGNOSIS — N2581 Secondary hyperparathyroidism of renal origin: Secondary | ICD-10-CM | POA: Diagnosis not present

## 2019-10-27 DIAGNOSIS — D631 Anemia in chronic kidney disease: Secondary | ICD-10-CM | POA: Diagnosis not present

## 2019-10-27 NOTE — Patient Outreach (Signed)
[facility]) Care Management  10/27/2019  Eileen Perez 05/07/1941 [id number]   No response from patient outreach attempts will proceed with case closure.  Per chart review, Patient moved to live with son Eileen Perez, [phone] in [place].     Objective:Per KPN (Knowledge Performance Now, point of care tool) and chart review,patient hospitalized 09/30/2019 - 11/94/1740 forAcute metabolic encephalopathysecondary acute CVA. 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 Patient has designated party release in chart for brother Eileen Perez) and [place] designated agents are: Eileen Perez, Eileen Perez, and Eileen Perez.      Assessment: Received [facility] Liaison Referral on 10/13/2019. Referral source: Eileen Perez.  Referral reason: Please assign to [facility] nurse for complex care and disease management       for diabetes follow up calls and assess for further needs. Patient       and  family had declined palliative care. Home with [facility]  Per chart review, patient had hospital follow up visit with primary provider on 10/15/2019 and patient is planning to move to [place] to live with son in the next week or so.Transition of care follow up not completed and patient unable to contact.    Plan:Case closure due to unable to reach.  RNCM will send MD case closure letter.      Eileen Perez H. Annia Friendly, BSN, [place] Management [facility] Telephonic CM Phone: [phone] Fax: [phone]

## 2019-10-29 DIAGNOSIS — N186 End stage renal disease: Secondary | ICD-10-CM | POA: Diagnosis not present

## 2019-10-29 DIAGNOSIS — N2581 Secondary hyperparathyroidism of renal origin: Secondary | ICD-10-CM | POA: Diagnosis not present

## 2019-10-29 DIAGNOSIS — Z992 Dependence on renal dialysis: Secondary | ICD-10-CM | POA: Diagnosis not present

## 2019-10-29 DIAGNOSIS — D631 Anemia in chronic kidney disease: Secondary | ICD-10-CM | POA: Diagnosis not present

## 2019-10-29 DIAGNOSIS — E1122 Type 2 diabetes mellitus with diabetic chronic kidney disease: Secondary | ICD-10-CM | POA: Diagnosis not present

## 2019-10-31 DIAGNOSIS — N186 End stage renal disease: Secondary | ICD-10-CM | POA: Diagnosis not present

## 2019-10-31 DIAGNOSIS — N2581 Secondary hyperparathyroidism of renal origin: Secondary | ICD-10-CM | POA: Diagnosis not present

## 2019-10-31 DIAGNOSIS — D631 Anemia in chronic kidney disease: Secondary | ICD-10-CM | POA: Diagnosis not present

## 2019-10-31 DIAGNOSIS — E1122 Type 2 diabetes mellitus with diabetic chronic kidney disease: Secondary | ICD-10-CM | POA: Diagnosis not present

## 2019-10-31 DIAGNOSIS — Z992 Dependence on renal dialysis: Secondary | ICD-10-CM | POA: Diagnosis not present

## 2019-11-03 DIAGNOSIS — Z992 Dependence on renal dialysis: Secondary | ICD-10-CM | POA: Diagnosis not present

## 2019-11-03 DIAGNOSIS — N186 End stage renal disease: Secondary | ICD-10-CM | POA: Diagnosis not present

## 2019-11-03 DIAGNOSIS — D631 Anemia in chronic kidney disease: Secondary | ICD-10-CM | POA: Diagnosis not present

## 2019-11-03 DIAGNOSIS — E1122 Type 2 diabetes mellitus with diabetic chronic kidney disease: Secondary | ICD-10-CM | POA: Diagnosis not present

## 2019-11-03 DIAGNOSIS — N2581 Secondary hyperparathyroidism of renal origin: Secondary | ICD-10-CM | POA: Diagnosis not present

## 2019-11-04 ENCOUNTER — Other Ambulatory Visit: Payer: Self-pay | Admitting: Internal Medicine

## 2019-11-04 MED ORDER — CLOPIDOGREL BISULFATE 75 MG PO TABS: 75.0000 mg | ORAL_TABLET | Freq: Every day | ORAL | 0 refills | Status: AC

## 2019-11-04 MED ORDER — CLOPIDOGREL BISULFATE 75 MG PO TABS: 75.0000 mg | ORAL_TABLET | Freq: Every day | ORAL | 0 refills | Status: DC

## 2019-11-05 ENCOUNTER — Ambulatory Visit (INDEPENDENT_AMBULATORY_CARE_PROVIDER_SITE_OTHER): Payer: Medicare Other

## 2019-11-05 DIAGNOSIS — I639 Cerebral infarction, unspecified: Secondary | ICD-10-CM

## 2019-11-05 DIAGNOSIS — E1122 Type 2 diabetes mellitus with diabetic chronic kidney disease: Secondary | ICD-10-CM | POA: Diagnosis not present

## 2019-11-05 DIAGNOSIS — N2581 Secondary hyperparathyroidism of renal origin: Secondary | ICD-10-CM | POA: Diagnosis not present

## 2019-11-05 DIAGNOSIS — D631 Anemia in chronic kidney disease: Secondary | ICD-10-CM | POA: Diagnosis not present

## 2019-11-05 DIAGNOSIS — I4891 Unspecified atrial fibrillation: Secondary | ICD-10-CM | POA: Diagnosis not present

## 2019-11-05 DIAGNOSIS — N186 End stage renal disease: Secondary | ICD-10-CM | POA: Diagnosis not present

## 2019-11-05 DIAGNOSIS — Z992 Dependence on renal dialysis: Secondary | ICD-10-CM | POA: Diagnosis not present

## 2019-11-07 DIAGNOSIS — Z992 Dependence on renal dialysis: Secondary | ICD-10-CM | POA: Diagnosis not present

## 2019-11-07 DIAGNOSIS — N2581 Secondary hyperparathyroidism of renal origin: Secondary | ICD-10-CM | POA: Diagnosis not present

## 2019-11-07 DIAGNOSIS — E1122 Type 2 diabetes mellitus with diabetic chronic kidney disease: Secondary | ICD-10-CM | POA: Diagnosis not present

## 2019-11-07 DIAGNOSIS — D631 Anemia in chronic kidney disease: Secondary | ICD-10-CM | POA: Diagnosis not present

## 2019-11-07 DIAGNOSIS — N186 End stage renal disease: Secondary | ICD-10-CM | POA: Diagnosis not present

## 2019-11-10 DIAGNOSIS — D631 Anemia in chronic kidney disease: Secondary | ICD-10-CM | POA: Diagnosis not present

## 2019-11-10 DIAGNOSIS — N186 End stage renal disease: Secondary | ICD-10-CM | POA: Diagnosis not present

## 2019-11-10 DIAGNOSIS — E1122 Type 2 diabetes mellitus with diabetic chronic kidney disease: Secondary | ICD-10-CM | POA: Diagnosis not present

## 2019-11-10 DIAGNOSIS — N2581 Secondary hyperparathyroidism of renal origin: Secondary | ICD-10-CM | POA: Diagnosis not present

## 2019-11-10 DIAGNOSIS — Z992 Dependence on renal dialysis: Secondary | ICD-10-CM | POA: Diagnosis not present

## 2019-11-12 DIAGNOSIS — D631 Anemia in chronic kidney disease: Secondary | ICD-10-CM | POA: Diagnosis not present

## 2019-11-12 DIAGNOSIS — E1122 Type 2 diabetes mellitus with diabetic chronic kidney disease: Secondary | ICD-10-CM | POA: Diagnosis not present

## 2019-11-12 DIAGNOSIS — N186 End stage renal disease: Secondary | ICD-10-CM | POA: Diagnosis not present

## 2019-11-12 DIAGNOSIS — Z992 Dependence on renal dialysis: Secondary | ICD-10-CM | POA: Diagnosis not present

## 2019-11-12 DIAGNOSIS — N2581 Secondary hyperparathyroidism of renal origin: Secondary | ICD-10-CM | POA: Diagnosis not present

## 2019-11-15 DIAGNOSIS — N2581 Secondary hyperparathyroidism of renal origin: Secondary | ICD-10-CM | POA: Diagnosis not present

## 2019-11-15 DIAGNOSIS — E1122 Type 2 diabetes mellitus with diabetic chronic kidney disease: Secondary | ICD-10-CM | POA: Diagnosis not present

## 2019-11-15 DIAGNOSIS — Z992 Dependence on renal dialysis: Secondary | ICD-10-CM | POA: Diagnosis not present

## 2019-11-15 DIAGNOSIS — D631 Anemia in chronic kidney disease: Secondary | ICD-10-CM | POA: Diagnosis not present

## 2019-11-15 DIAGNOSIS — N186 End stage renal disease: Secondary | ICD-10-CM | POA: Diagnosis not present

## 2019-11-17 ENCOUNTER — Other Ambulatory Visit: Payer: Self-pay | Admitting: *Deleted

## 2019-11-17 ENCOUNTER — Encounter: Payer: Self-pay | Admitting: *Deleted

## 2019-11-17 DIAGNOSIS — N186 End stage renal disease: Secondary | ICD-10-CM | POA: Diagnosis not present

## 2019-11-17 DIAGNOSIS — E1122 Type 2 diabetes mellitus with diabetic chronic kidney disease: Secondary | ICD-10-CM | POA: Diagnosis not present

## 2019-11-17 DIAGNOSIS — Z992 Dependence on renal dialysis: Secondary | ICD-10-CM | POA: Diagnosis not present

## 2019-11-17 DIAGNOSIS — D631 Anemia in chronic kidney disease: Secondary | ICD-10-CM | POA: Diagnosis not present

## 2019-11-17 DIAGNOSIS — N2581 Secondary hyperparathyroidism of renal origin: Secondary | ICD-10-CM | POA: Diagnosis not present

## 2019-11-19 DIAGNOSIS — Z992 Dependence on renal dialysis: Secondary | ICD-10-CM | POA: Diagnosis not present

## 2019-11-19 DIAGNOSIS — E1122 Type 2 diabetes mellitus with diabetic chronic kidney disease: Secondary | ICD-10-CM | POA: Diagnosis not present

## 2019-11-19 DIAGNOSIS — N2581 Secondary hyperparathyroidism of renal origin: Secondary | ICD-10-CM | POA: Diagnosis not present

## 2019-11-19 DIAGNOSIS — D631 Anemia in chronic kidney disease: Secondary | ICD-10-CM | POA: Diagnosis not present

## 2019-11-19 DIAGNOSIS — N186 End stage renal disease: Secondary | ICD-10-CM | POA: Diagnosis not present

## 2019-11-24 ENCOUNTER — Encounter (INDEPENDENT_AMBULATORY_CARE_PROVIDER_SITE_OTHER): Payer: Medicare Other | Admitting: Ophthalmology

## 2019-11-25 NOTE — Progress Notes (Deleted)
Cardiology Office Note Date:  11/25/2019  Patient ID:  Eileen, Perez 1941-11-09, MRN [id number] PCP:  Unk Pinto, MD  Electrophysiologist:  Dr. Curt Bears  ***refresh   Chief Complaint: *** hospital follow up  History of Present Illness: Eileen Perez is a 79 y.o. 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 Nov 2020 w/cryptogenic stroke.  She had a TTE with preserved LVEF, no significant carotid disease, no TEE planned  EP was consulted for loop consideration.  At that time the patient's was being treated for C-diff and had plans to move to [place] to be near her family/son for help/home care.  Given this was planned to EM in the meantime for monitoring and if she ultimately stayed in [facility], to follow up outpatient on EM and discussion regarding loop implant. Discharge summary mentions transitioned from PD to HD during her hospitalization.  *** no EM *** living in [place]?? Here? *** symptoms *** bleeding hx?    Past Medical History:  Diagnosis Date  . Anemia   . Arthritis    Osteoarthritis  . Chronic kidney disease    Chronic renal insufficiency, peritoneal dialysis qday sees dr Moshe Cipro  . Dehydration   . Diabetes mellitus    Pre  . Diarrhea 09/11/2019  . Diverticulosis   . DJD 06/09/2013  . Gastritis 09/06/2019  . GERD (gastroesophageal reflux disease)   . Gout   . Hiatal hernia   . History of blood transfusion   . History of cerebral embolic infarction 62/13/0865  . Hyperlipidemia   . Hypertension   . Osteopenia   . Overweight (BMI 25.0-29.9) 10/31/2015  . Pancreatic cyst 1999  . Small bowel obstruction ([place]) 08/28/2019  . Thyroid disease    Hyperparathyroid   . Vitamin D deficiency 04/16/2014    Past Surgical History:  Procedure Laterality Date  . BIOPSY  04/25/2019   Procedure: BIOPSY;  Surgeon: Otis Brace, MD;  Location: [facility];  Service:  Gastroenterology;;  . BIOPSY  09/02/2019   Procedure: BIOPSY;  Surgeon: Otis Brace, MD;  Location: [place];  Service: Gastroenterology;;  . CHOLECYSTECTOMY    . COLONOSCOPY  03/21/12   Next one in 2018  . COLONOSCOPY WITH PROPOFOL N/A 04/25/2019   Procedure: COLONOSCOPY WITH PROPOFOL;  Surgeon: Otis Brace, MD;  Location: [facility];  Service: Gastroenterology;  Laterality: N/A;  . ESOPHAGOGASTRODUODENOSCOPY (EGD) WITH PROPOFOL N/A 04/25/2019   Procedure: ESOPHAGOGASTRODUODENOSCOPY (EGD) WITH PROPOFOL;  Surgeon: Otis Brace, MD;  Location: [facility];  Service: Gastroenterology;  Laterality: N/A;  . ESOPHAGOGASTRODUODENOSCOPY (EGD) WITH PROPOFOL N/A 09/02/2019   Procedure: ESOPHAGOGASTRODUODENOSCOPY (EGD) WITH PROPOFOL;  Surgeon: Otis Brace, MD;  Location: [facility];  Service: Gastroenterology;  Laterality: N/A;  . EYE SURGERY Left    cataract extraction with IOL  . INSERTION OF DIALYSIS CATHETER N/A 02/15/2017   Procedure: REVISION OF DIALYSIS CATHETER;  Surgeon: Algernon Huxley, MD;  Location: [facility];  Service: Cardiovascular;  Laterality: N/A;  . IR FLUORO GUIDE CV LINE RIGHT  08/29/2019  . IR FLUORO GUIDE CV LINE RIGHT  10/03/2019  . IR US GUIDE VASC ACCESS RIGHT  08/29/2019  . IR US GUIDE VASC ACCESS RIGHT  10/03/2019  . JOINT REPLACEMENT  2012   left knee  . left knee replacement     . PANCREATIC CYST EXCISION  1999  . POLYPECTOMY  04/25/2019   Procedure: POLYPECTOMY;  Surgeon: Otis Brace, MD;  Location: [facility];  Service: Gastroenterology;;  . Clide Deutscher  04/25/2019   Procedure: SCLEROTHERAPY;  Surgeon: Otis Brace, MD;  Location: [facility];  Service: Gastroenterology;;  . TOTAL HIP ARTHROPLASTY Right 05/26/2015   Procedure: RIGHT TOTAL HIP ARTHROPLASTY ANTERIOR APPROACH;  Surgeon: Gaynelle Arabian, MD;  Location: [facility];  Service: Orthopedics;  Laterality: Right;  . TOTAL KNEE ARTHROPLASTY Right 06/09/2013   Procedure: RIGHT TOTAL KNEE  ARTHROPLASTY;  Surgeon: Gearlean Alf, MD;  Location: [facility];  Service: Orthopedics;  Laterality: Right;    Current Outpatient Medications  Medication Sig Dispense Refill  . allopurinol (ZYLOPRIM) 300 MG tablet Take 1 tablet daily for gout 90 tablet 1  . b complex-vitamin c-folic acid (NEPHRO-VITE) 0.8 MG TABS tablet Take 1 tablet by mouth daily.     Marland Kitchen BAYER ASPIRIN EC LOW DOSE 81 MG EC tablet Take 81 mg by mouth daily.    . Blood Glucose Monitoring Suppl DEVI Check blood glucose before meals, at bedtime and as needed. 1 each 0  . cinacalcet (SENSIPAR) 30 MG tablet NEW PRESCRIPTION REQUEST: TAKE ONE TABLET BY MOUTH DAILY 90 tablet 1  . clopidogrel (PLAVIX) 75 MG tablet Take 1 tablet (75 mg total) by mouth daily. Take 1 tablet Daily to Prevent Blood Clots 90 tablet 0  . gentamicin cream (GARAMYCIN) 0.1 % Apply 1 application topically daily. (Patient taking differently: Apply 1 application topically every evening. ) 15 g 0  . glucose blood test strip Use as instructed 100 each 4  . Lancets (ACCU-CHEK SOFT TOUCH) lancets Use as instructed 100 each 12  . ondansetron (ZOFRAN-ODT) 4 MG disintegrating tablet Take 1 tablet every 8 hours as needed for nausea and vomiting. 90 tablet 1  . pantoprazole (PROTONIX) 40 MG tablet Takes 1 tablet twice a day for reflux. (Patient taking differently: Take 40 mg by mouth 2 (two) times daily. ) 60 tablet 0  . rosuvastatin (CRESTOR) 5 MG tablet Take 1 tablet after supper for cholesterol. (Patient taking differently: Take 5 mg by mouth daily after supper. Take 1 tablet after supper for cholesterol.) 90 tablet 1  . sevelamer carbonate (RENVELA) 800 MG tablet Take 1 tablet (800 mg total) by mouth 3 (three) times daily with meals. 270 tablet 3  . sucralfate (CARAFATE) 1 GM/10ML suspension NEW PRESCRIPTION REQUEST: TAKE 10 MLS BY MOUTH FOUR TIMES DAILY 1200 mL 1  . ZINC GLUCONATE PO Take 40 mg by mouth daily.      No current facility-administered medications for this  visit.    Allergies:   Ace inhibitors and Nsaids   Social History:  The patient  reports that she has never smoked. She has never used smokeless tobacco. She reports that she does not drink alcohol or use drugs.   Family History:  The patient's family history includes Cancer in her father and mother; Diabetes in her mother; Hypertension in her mother.  ROS:  Please see the history of present illness.  All other systems are reviewed and otherwise negative.   PHYSICAL EXAM: *** VS:  There were no vitals taken for this visit. BMI: There is no height or weight on file to calculate BMI. Well nourished, well developed, in no acute distress  HEENT: normocephalic, atraumatic  Neck: no JVD, carotid bruits or masses Cardiac:  normal S1, S2; RRR; no significant murmurs, no rubs, or gallops Lungs:  clear to auscultation bilaterally, no wheezing, rhonchi or rales  Abd: soft, nontender,  + BS MS: no deformity or atrophy Ext: no edema  Skin: warm and dry, no rash Neuro:  No gross deficits appreciated Psych: euthymic mood, full affect  *** PPM/ICD site is stable, no tethering or discomfort   EKG:  Done today shows ***   10/08/2019: TTE IMPRESSIONS 1. Left ventricular ejection fraction, by visual estimation, is 60 to 65%. The left ventricle has normal function. There is no left ventricular hypertrophy.  2. Left ventricular diastolic parameters are consistent with Grade I diastolic dysfunction (impaired relaxation).  3. The left ventricle has no regional wall motion abnormalities.  4. Global right ventricle has normal systolic function.The right ventricular size is normal. No increase in right ventricular wall thickness.  5. Left atrial size was normal.  6. Right atrial size was normal.  7. Trivial pericardial effusion is present.  8. The mitral valve is normal in structure. Trace mitral valve regurgitation. No evidence of mitral stenosis.  9. The tricuspid valve is normal in structure.  Tricuspid valve regurgitation is trivial. 10. The aortic valve is tricuspid. Aortic valve regurgitation is not visualized. Mild aortic valve sclerosis without stenosis. 11. TR signal is inadequate for assessing pulmonary artery systolic pressure. 12. The inferior vena cava is normal in size with greater than 50% respiratory variability, suggesting right atrial pressure of 3 mmHg.   Recent Labs: 08/28/2019: B Natriuretic Peptide 90.6 09/30/2019: Magnesium 3.2 10/01/2019: TSH 3.102 10/02/2019: ALT 15 10/12/2019: BUN 74; BUN 73; Creatinine, Ser 5.57; Creatinine, Ser 5.49; Hemoglobin 8.4; Platelets 122; Potassium 4.0; Potassium 4.0; Sodium 139; Sodium 140  10/08/2019: Cholesterol 77; HDL 29; LDL Cholesterol 20; Total CHOL/HDL Ratio 2.7; Triglycerides 141; VLDL 28   CrCl cannot be calculated (Patient's most recent lab result is older than the maximum 21 days allowed.).   Wt Readings from Last 3 Encounters:  10/15/19 108 lb 9.6 oz (49.3 kg)  10/12/19 131 lb 13.4 oz (59.8 kg)  09/18/19 109 lb 3.2 oz (49.5 kg)     Other studies reviewed: Additional studies/records reviewed today include: summarized above  ASSESSMENT AND PLAN:  1. Cryptogenic stroke     ***  2. HTN     ***  Disposition: F/u with ***  Current medicines are reviewed at length with the patient today.  The patient did not have any concerns regarding medicines.***  Signed, Tommye Standard, PA-C 11/25/2019 6:14 AM     [facility] [address] [place] [address] [phone] (office)  [phone] (fax)

## 2019-11-26 ENCOUNTER — Ambulatory Visit: Payer: Medicare Other | Admitting: Physician Assistant

## 2019-12-01 ENCOUNTER — Encounter: Payer: Self-pay | Admitting: Cardiology

## 2019-12-01 ENCOUNTER — Telehealth: Payer: Self-pay | Admitting: Cardiology

## 2019-12-01 NOTE — Telephone Encounter (Signed)
Patients daughter calling to see if there were any episodes that happened during her mothers 30 day event monitor. Mother lives in [place] now and is looking for a cardiologist in [place] and would like to know if another monitor should be ordered for her in [place]

## 2019-12-01 NOTE — Telephone Encounter (Signed)
I spoke to the patient and they have yet to return the 30 day monitor.  They will mail out soon to receive interpretation.

## 2019-12-01 NOTE — Telephone Encounter (Signed)
End of Service date on cardiac event monitor is 12/05/2019.  Results should be available to import 12/07/2019.

## 2019-12-09 ENCOUNTER — Other Ambulatory Visit: Payer: Self-pay | Admitting: Student

## 2019-12-09 DIAGNOSIS — I639 Cerebral infarction, unspecified: Secondary | ICD-10-CM

## 2019-12-09 DIAGNOSIS — I4891 Unspecified atrial fibrillation: Secondary | ICD-10-CM

## 2020-01-14 NOTE — Telephone Encounter (Signed)
error 

## 2020-02-16 ENCOUNTER — Other Ambulatory Visit: Payer: Medicare Other

## 2020-02-16 ENCOUNTER — Ambulatory Visit: Payer: Medicare Other | Admitting: Oncology

## 2020-04-21 IMAGING — DX DG CHEST 1V PORT
1 series · 1 of 1 positions shown · non-contrast
Comparison: Chest radiograph 09/02/2019

CLINICAL DATA: Altered

EXAM:
PORTABLE CHEST 1 VIEW

[chest ap]
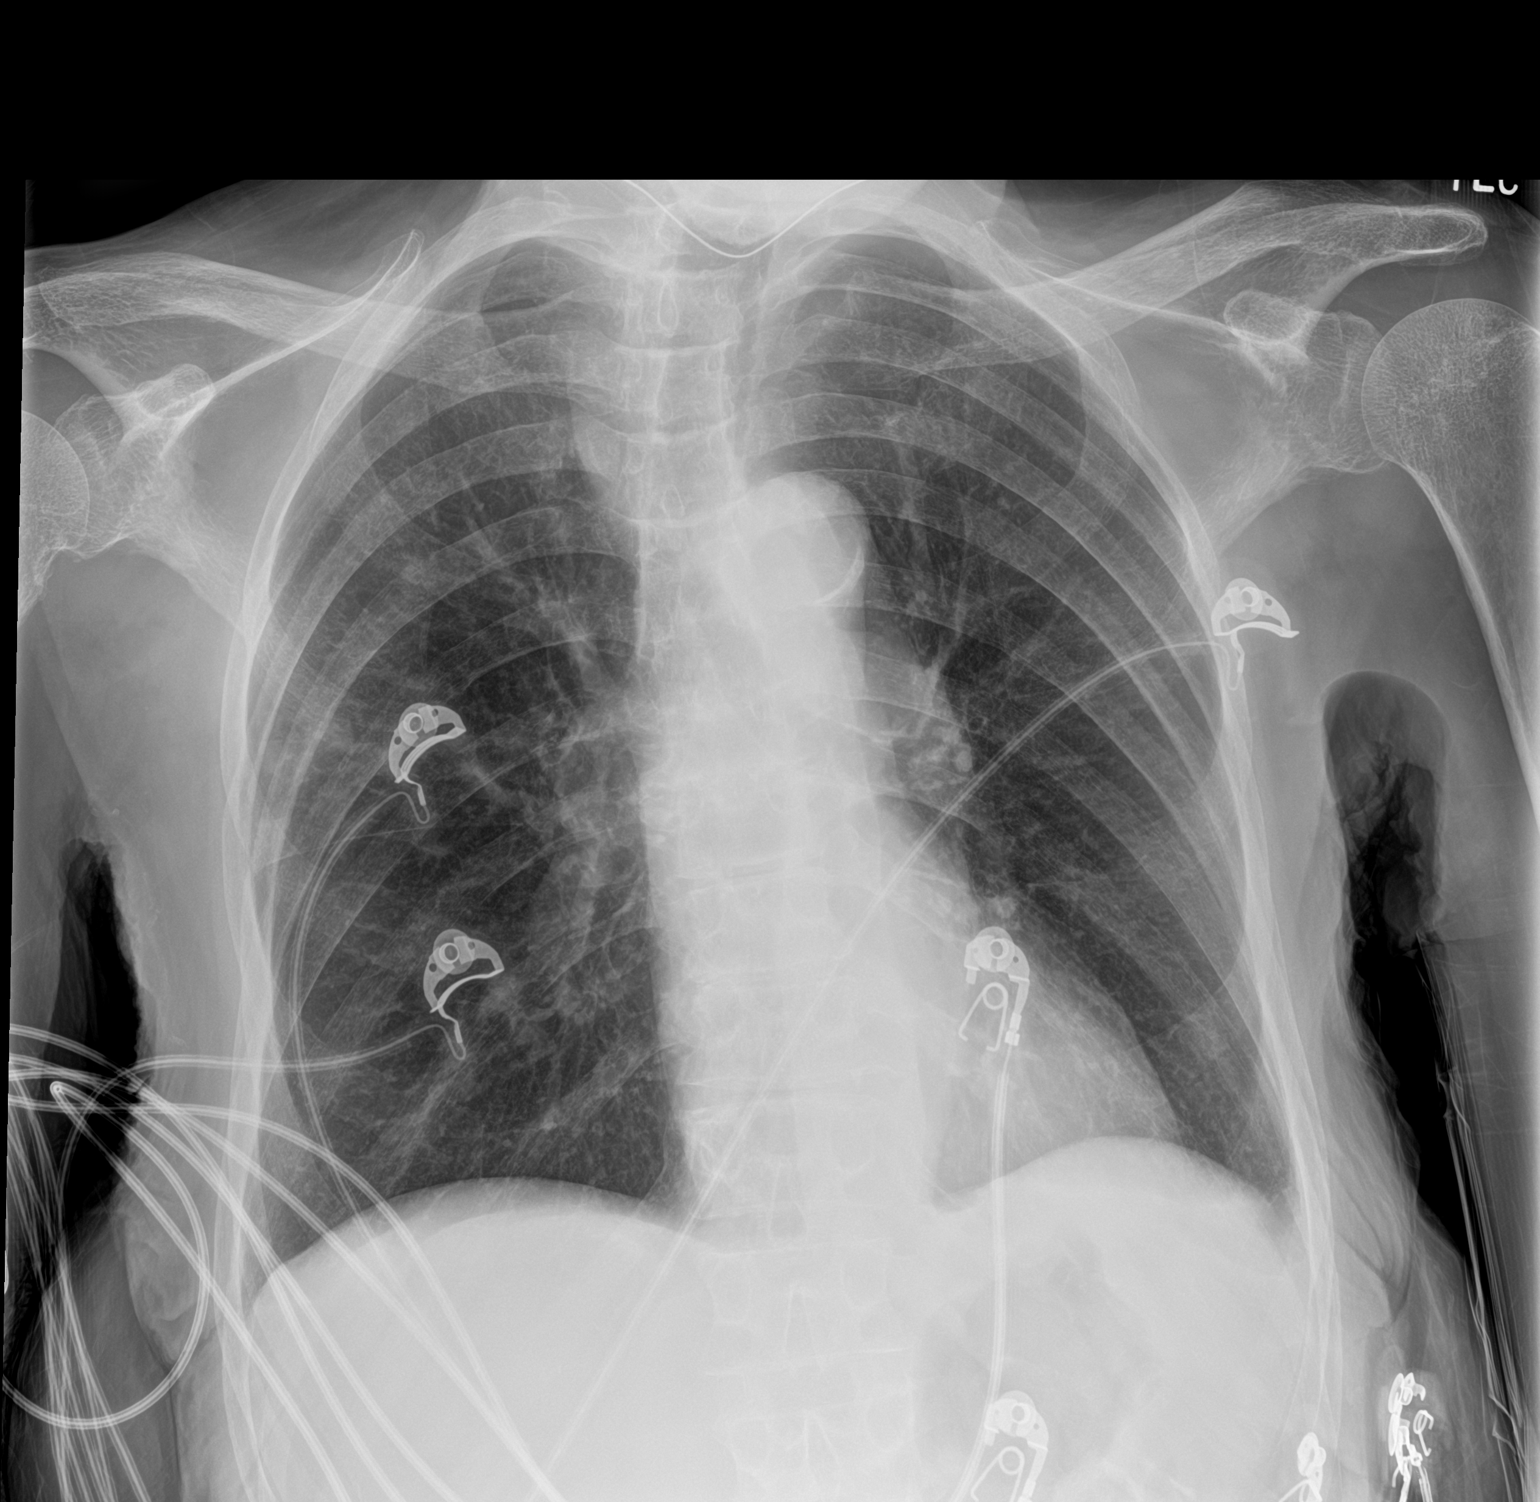

[1 of 1 positions shown; findings below may reference images not displayed]

FINDINGS: Heart size within normal limits.  Aortic atherosclerosis.

There is no focal consolidation within the lungs.

No evidence of pleural effusion or pneumothorax.

No acute bony abnormality.

Overlying monitoring leads.
IMPRESSION: No evidence of acute cardiopulmonary disease.

Aortic atherosclerosis.

## 2020-04-21 IMAGING — CT CT HEAD W/O CM
4 series · 16 of 47 positions shown, 18 images · non-contrast
Comparison: September 10, 2015.

CLINICAL DATA: Altered level of consciousness.

EXAM:
CT HEAD WITHOUT CONTRAST
TECHNIQUE: Contiguous axial images were obtained from the base of the skull
through the vertex without intravenous contrast.

[Series 3: head without · axial · non-contrast · 0.43mm/px · z∈[-72,+48]mm · 7 of 33 slices shown, 9 images]
[im 5/33  brain]
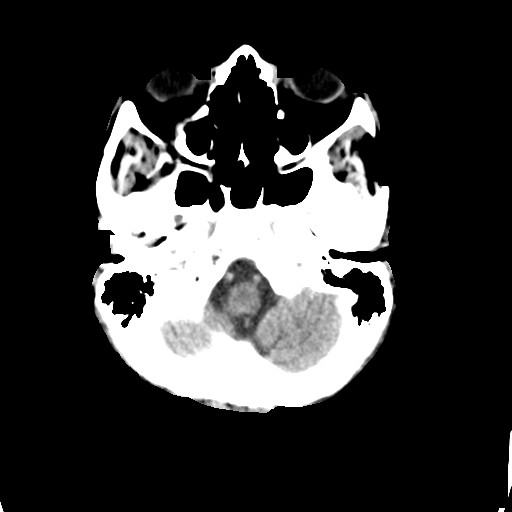
[im 5/33  bone]
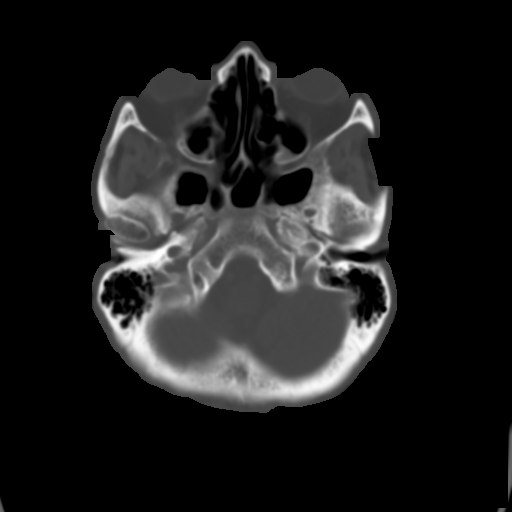
[im 9/33  brain]
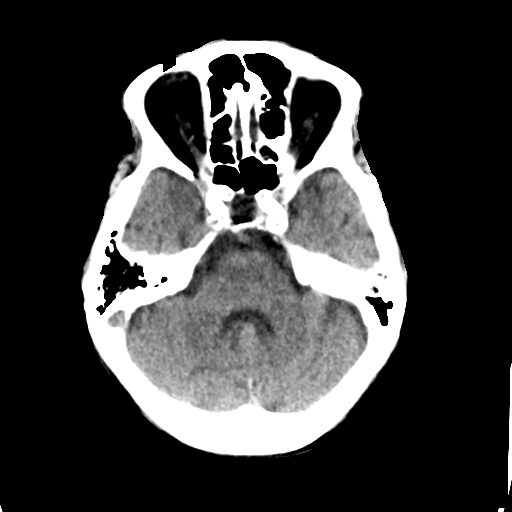
[im 13/33  brain]
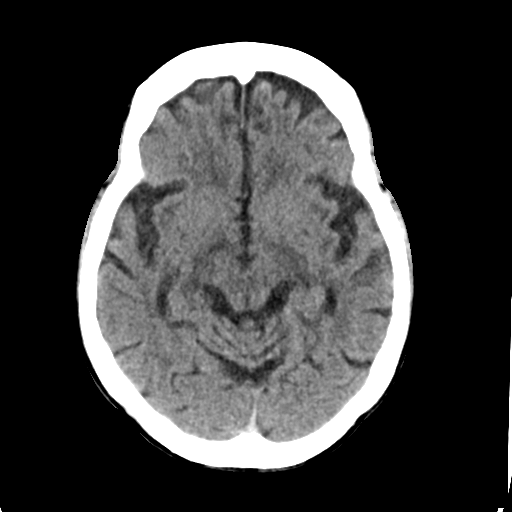
[im 17/33  brain]
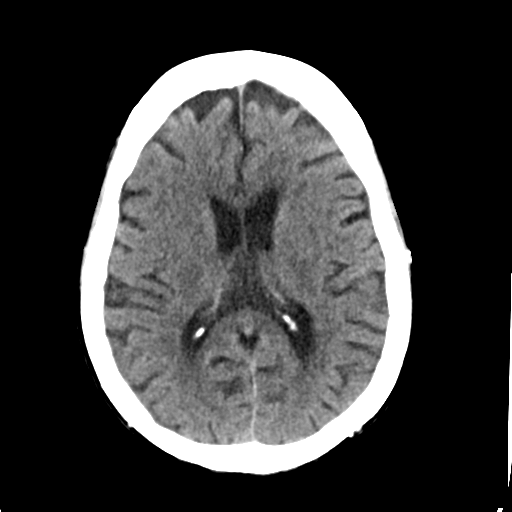
[im 21/33  brain]
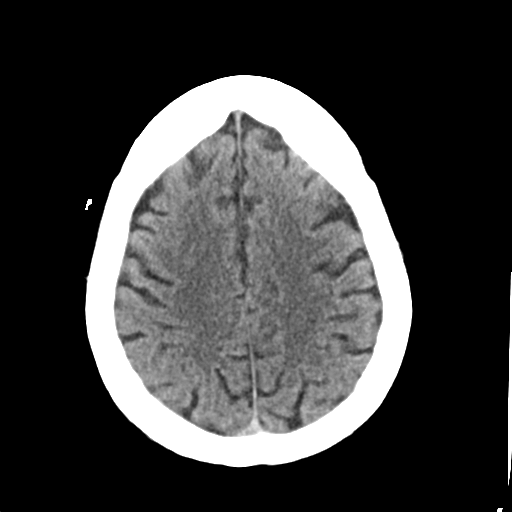
[im 21/33  bone]
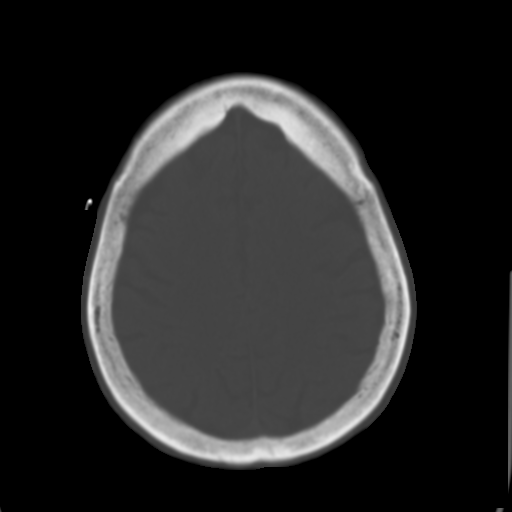
[im 25/33  brain]
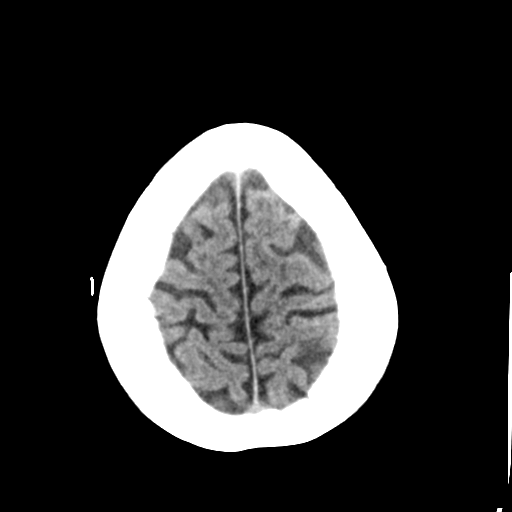
[im 29/33  brain]
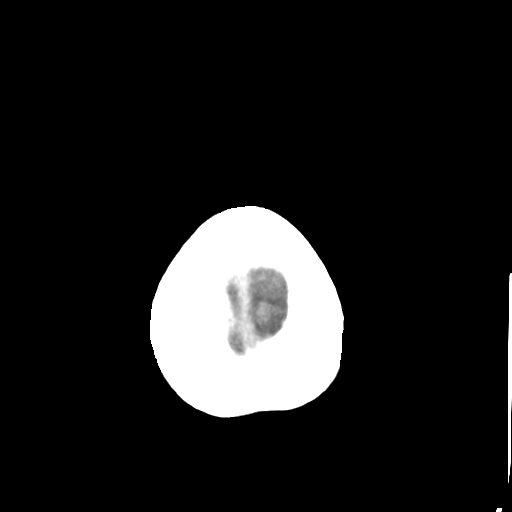

[Series 4: head bone · axial · 0.43mm/px · z∈[-76,-44]mm · 3 of 81 slices shown]
[im 9/81  bone]
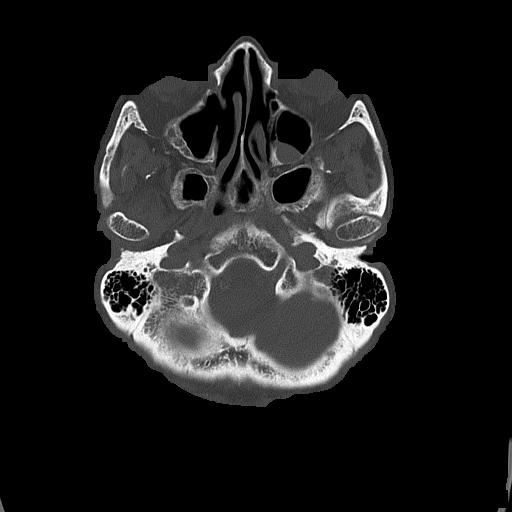
[im 17/81  bone]
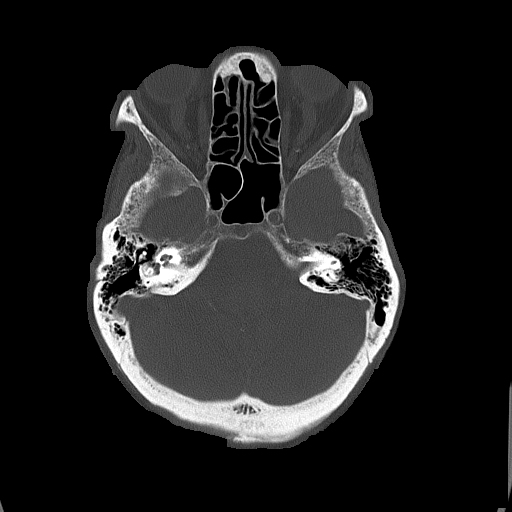
[im 25/81  bone]
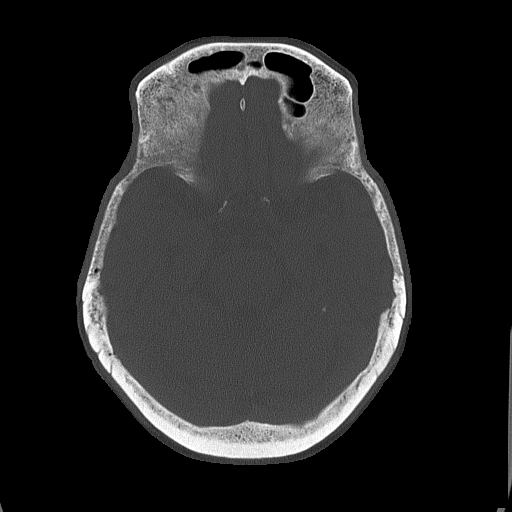

[Series 5: head without cor · coronal · non-contrast · 0.36mm/px · 3 of 67 slices shown]
[im 23/67  brain]
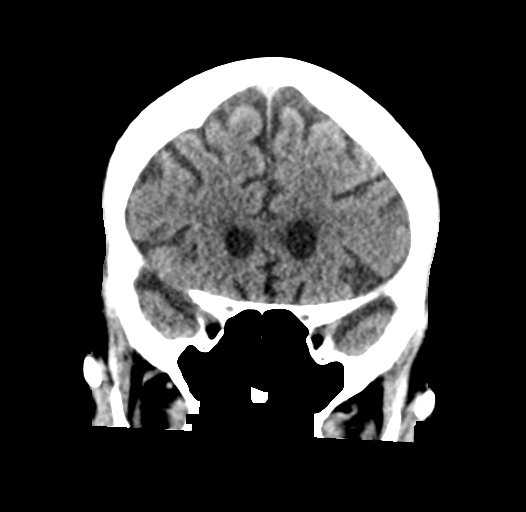
[im 30/67  brain]
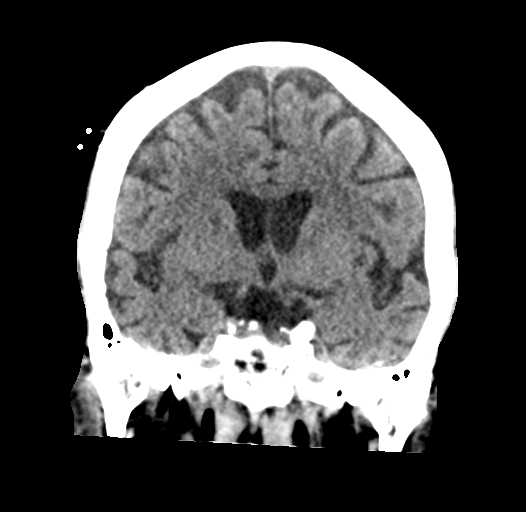
[im 37/67  brain]
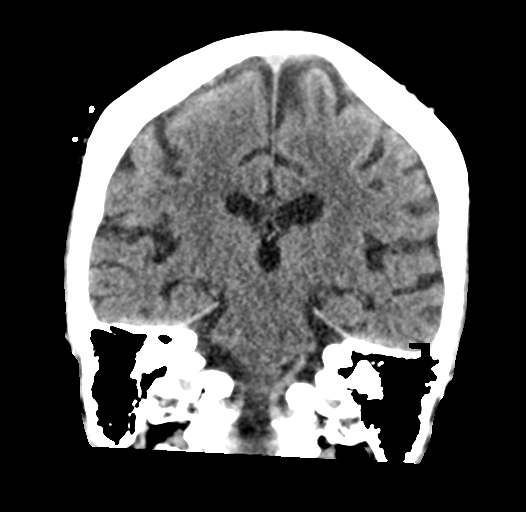

[Series 6: head without sag · sagittal · non-contrast · 0.35mm/px · 3 of 63 slices shown]
[im 21/63  brain]
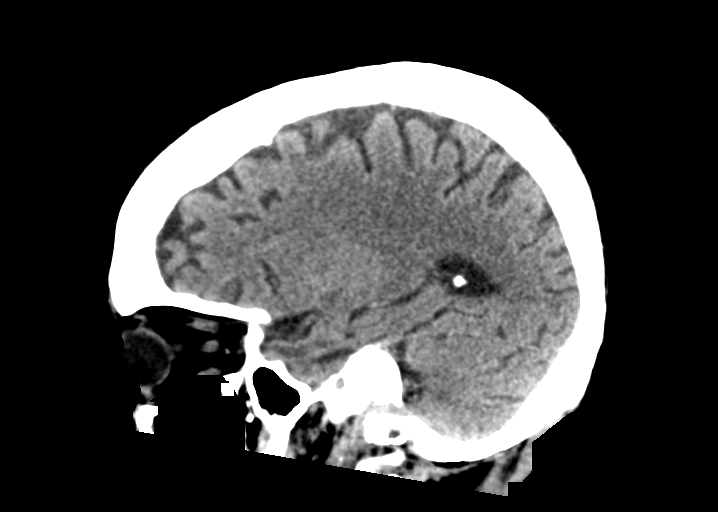
[im 32/63  brain]
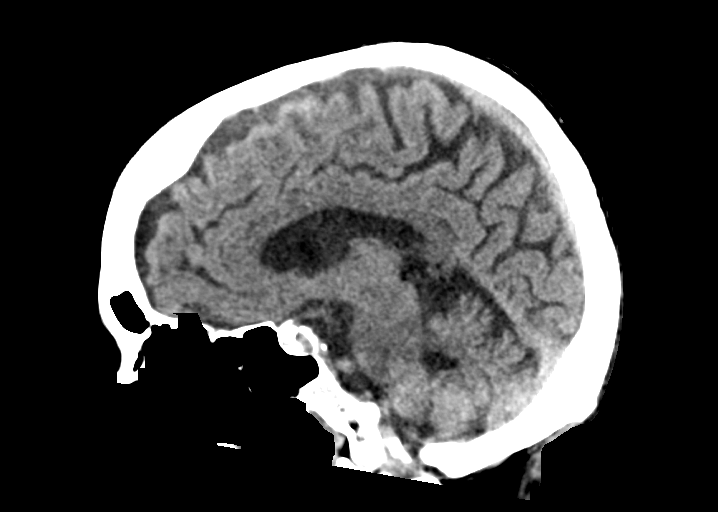
[im 42/63  brain]
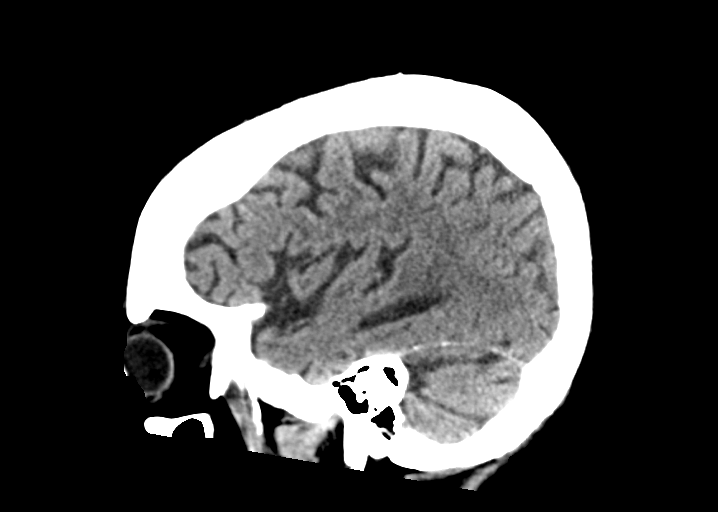

[16 of 47 positions shown; findings below may reference images not displayed]

FINDINGS: Brain: Mild diffuse cortical atrophy is noted. Mild chronic ischemic
white matter disease is noted. No mass effect or midline shift is
noted. Ventricular size is within normal limits. There is no
evidence of mass lesion, hemorrhage or acute infarction.

Vascular: No hyperdense vessel or unexpected calcification.

Skull: Normal. Negative for fracture or focal lesion.

Sinuses/Orbits: Stable left maxillary mucous retention cyst is
noted.

Other: None.
IMPRESSION: Mild diffuse cortical atrophy. Mild chronic ischemic white matter
disease. No acute intracranial abnormality seen.

## 2020-04-26 ENCOUNTER — Encounter: Payer: Medicare Other | Admitting: Internal Medicine

## 2020-07-28 ENCOUNTER — Ambulatory Visit: Payer: Medicare Other | Admitting: Adult Health
